# Patient Record
Sex: Male | Born: 1955 | Race: White | Hispanic: No | Marital: Married | Smoking: Former smoker
Health system: Southern US, Community
[De-identification: ages and names within clinical notes are randomized; demographics above are authoritative.]

## PROBLEM LIST (undated history)

## (undated) DIAGNOSIS — J302 Other seasonal allergic rhinitis: Secondary | ICD-10-CM

## (undated) DIAGNOSIS — E079 Disorder of thyroid, unspecified: Secondary | ICD-10-CM

## (undated) DIAGNOSIS — E7800 Pure hypercholesterolemia, unspecified: Secondary | ICD-10-CM

## (undated) DIAGNOSIS — C9000 Multiple myeloma not having achieved remission: Secondary | ICD-10-CM

## (undated) DIAGNOSIS — K219 Gastro-esophageal reflux disease without esophagitis: Secondary | ICD-10-CM

## (undated) DIAGNOSIS — M5431 Sciatica, right side: Secondary | ICD-10-CM

## (undated) DIAGNOSIS — E039 Hypothyroidism, unspecified: Secondary | ICD-10-CM

## (undated) DIAGNOSIS — Z9289 Personal history of other medical treatment: Secondary | ICD-10-CM

## (undated) DIAGNOSIS — J3089 Other allergic rhinitis: Secondary | ICD-10-CM

## (undated) DIAGNOSIS — J189 Pneumonia, unspecified organism: Secondary | ICD-10-CM

## (undated) DIAGNOSIS — J849 Interstitial pulmonary disease, unspecified: Secondary | ICD-10-CM

## (undated) HISTORY — DX: Other seasonal allergic rhinitis: J30.2

## (undated) HISTORY — PX: NASAL SINUS SURGERY: SHX719

## (undated) HISTORY — DX: Other allergic rhinitis: J30.89

## (undated) HISTORY — PX: NECK SURGERY: SHX720

## (undated) HISTORY — DX: Sciatica, right side: M54.31

---

## 1994-10-21 HISTORY — PX: NECK SURGERY: SHX720

## 2006-11-09 ENCOUNTER — Inpatient Hospital Stay (HOSPITAL_COMMUNITY): Admission: EM | Admit: 2006-11-09 | Discharge: 2006-11-11 | Payer: Self-pay | Admitting: Emergency Medicine

## 2011-07-16 ENCOUNTER — Other Ambulatory Visit: Payer: Self-pay | Admitting: Allergy

## 2011-07-16 ENCOUNTER — Ambulatory Visit
Admission: RE | Admit: 2011-07-16 | Discharge: 2011-07-16 | Disposition: A | Discharge status: Home / Self Care | Payer: Managed Care, Other (non HMO) | Source: Ambulatory Visit | Attending: Allergy | Admitting: Allergy

## 2011-07-16 DIAGNOSIS — J45901 Unspecified asthma with (acute) exacerbation: Secondary | ICD-10-CM

## 2011-10-01 ENCOUNTER — Institutional Professional Consult (permissible substitution): Payer: Managed Care, Other (non HMO) | Admitting: Internal Medicine

## 2011-10-03 ENCOUNTER — Encounter: Payer: Self-pay | Admitting: *Deleted

## 2011-10-03 ENCOUNTER — Encounter: Payer: Self-pay | Admitting: Internal Medicine

## 2011-10-04 ENCOUNTER — Ambulatory Visit (INDEPENDENT_AMBULATORY_CARE_PROVIDER_SITE_OTHER): Payer: Managed Care, Other (non HMO) | Admitting: Internal Medicine

## 2011-10-04 ENCOUNTER — Encounter: Payer: Self-pay | Admitting: Internal Medicine

## 2011-10-04 VITALS — BP 110/84 | HR 57 | Temp 98.0°F | Ht 71.0 in | Wt 222.4 lb

## 2011-10-04 DIAGNOSIS — R0602 Shortness of breath: Secondary | ICD-10-CM

## 2011-10-04 NOTE — Assessment & Plan Note (Signed)
Unclear etiology for dyspnea. Differential diagnosis includes asthma, lack of fitness, anxiety state , or even rarely neuromuscular weakness especially given history of hypothyroidism. Angina equivalent is a remote possibility. I will first get pulmonary function test. When I mention this he said that he would be claustrophobic for the body box method but our method is different and he has agreed to proceed. His pulmonary function test is normal we'll proceed with methacholine challenge test soon as he comes off Singulair for that. If that too is negative, we'll get cardiopulmonary bike stress test. Meanwhile we'll await for results of cardiology workup from Dr. Jacinto Halim. Update Dr. Newt Lukes

## 2011-10-04 NOTE — Patient Instructions (Signed)
Nurse will show you our PFT room so you feel comfortable with that room for testing Please have full PFT breathing test - I will look at result and call you with next step which could be methacholine challenge test or bike pulmonary stress test I will be in touch with Dr Jacinto Halim as well

## 2011-10-04 NOTE — Progress Notes (Signed)
Subjective:    Patient ID: Samuel [name], male    DOB: 31-Dec-1955, 55 y.o.   MRN: [id number]  HPI  IOV 10/04/2011  55 year old male. Allergies and possible asthma. Hypothyroidism. Anxiety state but no formal diagnosis. 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 Reports dyspnea. INsidious onset "all my life". Increased after starting allergy shots in June 2012. Says during hikes after he gets going it gets better. Same in gym. Notices more when he is starting out or stationary at rest. HE is not sure what it is.  Feels he needs to take a deep breath on occasion. So, now referred to cardiology and pulmonary. Says ICS Qvar in august/sept 2012 made it worse. Outside chart mentions asthma but patient says not sure if he has asthma. But reports childhood hx of asthma diagnosed by a pediatrician at age 56. Says he had similar symptoms.  Up until 1990 used to 10K but even back then had similar symptoms and would need to warm up before feeling better. Then stopped running due to neck problems. Similar during swimming exercise in 1996-1997. Quit swimming 1999. Episodes associated with wheezing but no cough. Unclear if albuterol helps but on xopenex prn esp before walking. This whole thing feels like he is not getting "enough juice". Never had PFT but recollects normal spirometry at Dr Lyla Son office.  He has seen Dr Shon Baton cardiology for above - treadmill and echo and carotid doppler all pending. Current pulmonary modulating drug is Singulair only  Chest x-ray 07/16/2011 is clear per my personal review.   Review of Systems  Constitutional: Negative for fever and unexpected weight change.  HENT: Negative for ear pain, nosebleeds, congestion, sore throat, rhinorrhea, sneezing, trouble swallowing, dental problem, postnasal drip and sinus pressure.   Eyes: Negative for redness and itching.  Respiratory: Positive for  shortness of breath. Negative for cough, chest tightness and wheezing.   Cardiovascular: Negative for palpitations and leg swelling.  Gastrointestinal: Negative for nausea and vomiting.  Genitourinary: Negative for dysuria.  Musculoskeletal: Negative for joint swelling.  Skin: Negative for rash.  Neurological: Negative for headaches.  Hematological: Does not bruise/bleed easily.  Psychiatric/Behavioral: Negative for dysphoric mood. The patient is nervous/anxious.        Objective:   Physical Exam  Nursing note and vitals reviewed. Constitutional: He is oriented to person, place, and time. He appears well-developed and well-nourished. No distress.  HENT:  Head: Normocephalic and atraumatic.  Right Ear: External ear normal.  Left Ear: External ear normal.  Mouth/Throat: Oropharynx is clear and moist. No oropharyngeal exudate.  Eyes: Conjunctivae and EOM are normal. Pupils are equal, round, and reactive to light. Right eye exhibits no discharge. Left eye exhibits no discharge. No scleral icterus.  Neck: Normal range of motion. Neck supple. No JVD present. No tracheal deviation present. No thyromegaly present.  Cardiovascular: Normal rate, regular rhythm and intact distal pulses.  Exam reveals no gallop and no friction rub.   No murmur heard. Pulmonary/Chest: Effort normal and breath sounds normal. No respiratory distress. He has no wheezes. He has no rales. He exhibits no tenderness.  Abdominal: Soft. Bowel sounds are normal. He exhibits no distension and no mass. There is no tenderness. There is no rebound and no guarding.  Musculoskeletal: Normal range of motion. He exhibits no edema and no tenderness.  Lymphadenopathy:    He has no cervical adenopathy.  Neurological: He is alert and oriented to person, place, and time. He has normal reflexes. No cranial nerve deficit. Coordination normal.  Skin: Skin is warm and dry. No rash noted. He is not diaphoretic. No erythema. No pallor.    Psychiatric: He has a normal mood and affect. His behavior is normal. Judgment and thought content normal.          Assessment & Plan:

## 2011-10-25 ENCOUNTER — Ambulatory Visit (INDEPENDENT_AMBULATORY_CARE_PROVIDER_SITE_OTHER): Payer: Managed Care, Other (non HMO) | Admitting: Internal Medicine

## 2011-10-25 DIAGNOSIS — R0602 Shortness of breath: Secondary | ICD-10-CM

## 2011-10-25 LAB — PULMONARY FUNCTION TEST

## 2011-10-25 NOTE — Progress Notes (Signed)
PFT done today. 

## 2011-10-29 ENCOUNTER — Telehealth: Payer: Self-pay | Admitting: Internal Medicine

## 2011-10-29 DIAGNOSIS — R05 Cough: Secondary | ICD-10-CM

## 2011-10-29 DIAGNOSIS — R059 Cough, unspecified: Secondary | ICD-10-CM

## 2011-10-29 NOTE — Telephone Encounter (Signed)
Please let him know pft 10/25/11 is normal. And as disussed in office next step is to have methacholine challenge test;  1 week off singulair and 2-3 weeks off ihaled steroids. AFter that, I will reivew that and if normal order CPST bike test again as adiscussed in office. Please set up above

## 2011-10-31 NOTE — Telephone Encounter (Signed)
Pt has called in to get the results from his PFT.  Pt asked to be reached at [phone].  Antionette Fairy

## 2011-10-31 NOTE — Telephone Encounter (Signed)
I reviewed PFT results with the patient. Order placed for MCT. Carron Curie, CMA

## 2011-11-04 ENCOUNTER — Encounter: Payer: Self-pay | Admitting: Internal Medicine

## 2011-11-07 ENCOUNTER — Ambulatory Visit (HOSPITAL_COMMUNITY)
Admission: RE | Admit: 2011-11-07 | Discharge: 2011-11-07 | Disposition: A | Discharge status: Home / Self Care | Payer: Managed Care, Other (non HMO) | Source: Ambulatory Visit | Attending: Internal Medicine | Admitting: Internal Medicine

## 2011-11-07 DIAGNOSIS — R05 Cough: Secondary | ICD-10-CM

## 2011-11-07 DIAGNOSIS — R0602 Shortness of breath: Secondary | ICD-10-CM | POA: Insufficient documentation

## 2011-11-07 DIAGNOSIS — R059 Cough, unspecified: Secondary | ICD-10-CM

## 2011-11-07 LAB — PULMONARY FUNCTION TEST

## 2011-11-07 MED ORDER — METHACHOLINE 16 MG/ML NEB SOLN
2.0000 mL | Freq: Once | RESPIRATORY_TRACT | Status: AC
  Administered 2011-11-07: 32 mg via RESPIRATORY_TRACT
  Filled 2011-11-07: qty 2

## 2011-11-07 MED ORDER — ALBUTEROL SULFATE (5 MG/ML) 0.5% IN NEBU
2.5000 mg | INHALATION_SOLUTION | Freq: Once | RESPIRATORY_TRACT | Status: AC
  Administered 2011-11-07: 2.5 mg via RESPIRATORY_TRACT

## 2011-11-07 MED ORDER — METHACHOLINE 4 MG/ML NEB SOLN
2.0000 mL | Freq: Once | RESPIRATORY_TRACT | Status: AC
  Administered 2011-11-07: 8 mg via RESPIRATORY_TRACT
  Filled 2011-11-07: qty 2

## 2011-11-07 MED ORDER — METHACHOLINE 1 MG/ML NEB SOLN
2.0000 mL | Freq: Once | RESPIRATORY_TRACT | Status: AC
  Administered 2011-11-07: 2 mg via RESPIRATORY_TRACT
  Filled 2011-11-07: qty 2

## 2011-11-07 MED ORDER — METHACHOLINE 0.0625 MG/ML NEB SOLN
2.0000 mL | Freq: Once | RESPIRATORY_TRACT | Status: AC
  Administered 2011-11-07: 0.125 mg via RESPIRATORY_TRACT
  Filled 2011-11-07: qty 2

## 2011-11-07 MED ORDER — METHACHOLINE 0.25 MG/ML NEB SOLN
2.0000 mL | Freq: Once | RESPIRATORY_TRACT | Status: AC
  Administered 2011-11-07: 0.5 mg via RESPIRATORY_TRACT
  Filled 2011-11-07: qty 2

## 2011-11-07 MED ORDER — SODIUM CHLORIDE 0.9 % IN NEBU
3.0000 mL | INHALATION_SOLUTION | Freq: Once | RESPIRATORY_TRACT | Status: AC
  Administered 2011-11-07: 3 mL via RESPIRATORY_TRACT
  Filled 2011-11-07: qty 3

## 2011-11-14 ENCOUNTER — Telehealth: Payer: Self-pay | Admitting: Internal Medicine

## 2011-11-14 DIAGNOSIS — R0600 Dyspnea, unspecified: Secondary | ICD-10-CM

## 2011-11-14 NOTE — Telephone Encounter (Signed)
Methacholin ehcallenge test 11/07/11 is borderline for asthma or not.   He needs to proceed to CPST test with EIB challenge for dyspnea, done by Paul Chase. And then come and see me. This was discussed with him at prior OV. IF he is confused or does not understand or has more questions please let me know and I will call him 

## 2011-11-14 NOTE — Telephone Encounter (Signed)
Methacholin ehcallenge test 11/07/11 is borderline for asthma or not.   He needs to proceed to CPST test with EIB challenge for dyspnea, done by Laymond Purser. And then come and see me. This was discussed with him at prior OV. IF he is confused or does not understand or has more questions please let me know and I will call him

## 2011-11-14 NOTE — Telephone Encounter (Signed)
Pt aware of results and order placed for CPST. Carron Curie, CMA

## 2016-10-31 ENCOUNTER — Other Ambulatory Visit: Payer: Self-pay | Admitting: Family Medicine

## 2016-10-31 ENCOUNTER — Ambulatory Visit
Admission: RE | Admit: 2016-10-31 | Discharge: 2016-10-31 | Disposition: A | Discharge status: Home / Self Care | Payer: Managed Care, Other (non HMO) | Source: Ambulatory Visit | Attending: Family Medicine | Admitting: Family Medicine

## 2016-10-31 DIAGNOSIS — J069 Acute upper respiratory infection, unspecified: Secondary | ICD-10-CM

## 2017-05-16 ENCOUNTER — Ambulatory Visit
Admission: RE | Admit: 2017-05-16 | Discharge: 2017-05-16 | Disposition: A | Discharge status: Home / Self Care | Payer: 59 | Source: Ambulatory Visit | Attending: Allergy | Admitting: Allergy

## 2017-05-16 ENCOUNTER — Other Ambulatory Visit: Payer: Self-pay | Admitting: Allergy

## 2017-05-16 DIAGNOSIS — J4520 Mild intermittent asthma, uncomplicated: Secondary | ICD-10-CM

## 2017-11-09 ENCOUNTER — Other Ambulatory Visit: Payer: Self-pay | Admitting: Family Medicine

## 2017-11-09 DIAGNOSIS — M25561 Pain in right knee: Secondary | ICD-10-CM

## 2017-11-13 ENCOUNTER — Ambulatory Visit
Admission: RE | Admit: 2017-11-13 | Discharge: 2017-11-13 | Disposition: A | Discharge status: Home / Self Care | Payer: 59 | Source: Ambulatory Visit | Attending: Family Medicine | Admitting: Family Medicine

## 2017-11-13 DIAGNOSIS — M25561 Pain in right knee: Secondary | ICD-10-CM

## 2020-04-18 ENCOUNTER — Ambulatory Visit (INDEPENDENT_AMBULATORY_CARE_PROVIDER_SITE_OTHER): Payer: Managed Care, Other (non HMO) | Admitting: Family Medicine

## 2020-04-18 ENCOUNTER — Other Ambulatory Visit: Payer: Self-pay

## 2020-04-18 ENCOUNTER — Encounter: Payer: Self-pay | Admitting: Family Medicine

## 2020-04-18 ENCOUNTER — Ambulatory Visit: Payer: Self-pay

## 2020-04-18 VITALS — BP 130/78 | HR 63 | Ht 71.0 in | Wt 228.0 lb

## 2020-04-18 DIAGNOSIS — M7541 Impingement syndrome of right shoulder: Secondary | ICD-10-CM

## 2020-04-18 DIAGNOSIS — M79601 Pain in right arm: Secondary | ICD-10-CM

## 2020-04-18 NOTE — Patient Instructions (Addendum)
Thank you for coming in today. Attend PT.  Use voltaren gel over the counter up to 4x daily.  Let me know if not improving in 4-6 weeks.  Next step is probably injection.    Shoulder Impingement Syndrome Rehab Ask your health care provider which exercises are safe for you. Do exercises exactly as told by your health care provider and adjust them as directed. It is normal to feel mild stretching, pulling, tightness, or discomfort as you do these exercises. Stop right away if you feel sudden pain or your pain gets worse. Do not begin these exercises until told by your health care provider. Stretching and range-of-motion exercise This exercise warms up your muscles and joints and improves the movement and flexibility of your shoulder. This exercise also helps to relieve pain and stiffness. Passive horizontal adduction In passive adduction, you use your other hand to move the injured arm toward your body. The injured arm does not move on its own. In this movement, your arm is moved across your body in the horizontal plane (horizontal adduction). 1. Sit or stand and pull your left / right elbow across your chest, toward your other shoulder. Stop when you feel a gentle stretch in the back of your shoulder and upper arm. ? Keep your arm at shoulder height. ? Keep your arm as close to your body as you comfortably can. 2. Hold for __________ seconds. 3. Slowly return to the starting position. Repeat __________ times. Complete this exercise __________ times a day. Strengthening exercises These exercises build strength and endurance in your shoulder. Endurance is the ability to use your muscles for a long time, even after they get tired. External rotation, isometric This is an exercise in which you press the back of your wrist against a door frame without moving your shoulder joint (isometric). 1. Stand or sit in a doorway, facing the door frame. 2. Bend your left / right elbow and place the back of  your wrist against the door frame. Only the back of your wrist should be touching the frame. Keep your upper arm at your side. 3. Gently press your wrist against the door frame, as if you are trying to push your arm away from your abdomen (external rotation). Press as hard as you are able without pain. ? Avoid shrugging your shoulder while you press your wrist against the door frame. Keep your shoulder blade tucked down toward the middle of your back. 4. Hold for __________ seconds. 5. Slowly release the tension, and relax your muscles completely before you repeat the exercise. Repeat __________ times. Complete this exercise __________ times a day. Internal rotation, isometric This is an exercise in which you press your palm against a door frame without moving your shoulder joint (isometric). 1. Stand or sit in a doorway, facing the door frame. 2. Bend your left / right elbow and place the palm of your hand against the door frame. Only your palm should be touching the frame. Keep your upper arm at your side. 3. Gently press your hand against the door frame, as if you are trying to push your arm toward your abdomen (internal rotation). Press as hard as you are able without pain. ? Avoid shrugging your shoulder while you press your hand against the door frame. Keep your shoulder blade tucked down toward the middle of your back. 4. Hold for __________ seconds. 5. Slowly release the tension, and relax your muscles completely before you repeat the exercise. Repeat __________ times. Complete this  exercise __________ times a day. Scapular protraction, supine  1. Lie on your back on a firm surface (supine position). Hold a __________ weight in your left / right hand. 2. Raise your left / right arm straight into the air so your hand is directly above your shoulder joint. 3. Push the weight into the air so your shoulder (scapula) lifts off the surface that you are lying on. The scapula will push up or  forward (protraction). Do not move your head, neck, or back. 4. Hold for __________ seconds. 5. Slowly return to the starting position. Let your muscles relax completely before you repeat this exercise. Repeat __________ times. Complete this exercise __________ times a day. Scapular retraction  1. Sit in a stable chair without armrests, or stand up. 2. Secure an exercise band to a stable object in front of you so the band is at shoulder height. 3. Hold one end of the exercise band in each hand. Your palms should face down. 4. Squeeze your shoulder blades together (retraction) and move your elbows slightly behind you. Do not shrug your shoulders upward while you do this. 5. Hold for __________ seconds. 6. Slowly return to the starting position. Repeat __________ times. Complete this exercise __________ times a day. Shoulder extension  1. Sit in a stable chair without armrests, or stand up. 2. Secure an exercise band to a stable object in front of you so the band is above shoulder height. 3. Hold one end of the exercise band in each hand. 4. Straighten your elbows and lift your hands up to shoulder height. 5. Squeeze your shoulder blades together and pull your hands down to the sides of your thighs (extension). Stop when your hands are straight down by your sides. Do not let your hands go behind your body. 6. Hold for __________ seconds. 7. Slowly return to the starting position. Repeat __________ times. Complete this exercise __________ times a day. This information is not intended to replace advice given to you by your health care provider. Make sure you discuss any questions you have with your health care provider. Document Revised: 01/29/2019 Document Reviewed: 11/02/2018 Elsevier Patient Education  [place].

## 2020-04-18 NOTE — Progress Notes (Signed)
    Subjective:    CC: R shoulder pain  I, Molly Weber, LAT, ATC, am serving as scribe for Dr. Lynne Leader.  HPI: Pt is a 64 y/o male presenting w/ c/o shoulder pain x 10 days w/ no known MOI.  He does recall playing golf around that time and also having a deep tissue massage.  He locates his pain to his R lateral arm  Radiating pain: yes into R upper arm Shoulder mechanical symptoms: yes Aggravating factors: bent elbow golf swing; reaching; lowering from an aBducted position; R sidelying Treatments tried: Advil;   Pertinent review of Systems: No fevers or chills  Relevant historical information: Hypothyroidism   Objective:    Vitals:   04/18/20 1450  BP: 130/78  Pulse: 63  SpO2: 98%   General: Well Developed, well nourished, and in no acute distress.   MSK: Right shoulder normal-appearing Nontender. Normal motion pain with abduction. Intact strength abduction external/internal rotation pain with abduction and internal rotation. Positive empty can test. Negative Hawkins and Neer's test. Negative Yergason's and speeds test.   Lab and Radiology Results  Diagnostic Limited MSK Ultrasound of: Right shoulder Biceps tendon intact normal-appearing in bicipital groove. Subscapularis tendon is intact. Supraspinatus tendon is intact with increased subacromial bursa thickness present. Infraspinatus tendon is intact. AC joint degenerative with effusion. Impression: Subacromial bursitis and AC DJD.    Impression and Recommendations:    Assessment and Plan: 64 y.o. male with right shoulder pain ongoing for about 10 days.  Pain consistent with subacromial bursitis or impingement.  Plan for physical therapy.  Patient has seen [facility] previously and done well any time to go back there.  Refer to that location.  Recommend also Voltaren gel.  Discussed possibility of injection.  He would like to try physical therapy first which is reasonable.  Recheck 4 to 6 weeks sooner if  needed.Marland Kitchen  PDMP not reviewed this encounter. Orders Placed This Encounter  Procedures  . [place] LIMITED JOINT SPACE STRUCTURES UP RIGHT(NO LINKED CHARGES)    Order Specific Question:   Reason for Exam (SYMPTOM  OR DIAGNOSIS REQUIRED)    Answer:   R arm pain    Order Specific Question:   Preferred imaging location?    Answer:   [place]  . Ambulatory referral to Physical Therapy    Referral Priority:   Routine    Referral Type:   Physical Medicine    Referral Reason:   Specialty Services Required    Requested Specialty:   Physical Therapy    Number of Visits Requested:   1   No orders of the defined types were placed in this encounter.   Discussed warning signs or symptoms. Please see discharge instructions. Patient expresses understanding.   The above documentation has been reviewed and is accurate and complete Lynne Leader, M.D.

## 2020-11-22 NOTE — Progress Notes (Signed)
I, Peterson Lombard, LAT, ATC acting as a scribe for Lynne Leader, MD.  Samuel [name] is a 65 y.o. male who presents to [place] [facility] today for R calf pain ongoing for 3-4 weeks. Pt was last seen by Dr. Georgina Snell on 04/18/20 for R shoulder pain secondary to subacromial bursitis/impingement. Today, pt note R calf pain w/ no known MOI. He may have injured it doing a calf exercise at the gym. He locates his pain to lateral aspect of gastroc. Pt c/o of sometimes he experiences severe pain, burning, and wakes him up at night.  He notes some pain in his low back that radiates down his right leg to the lateral calf as well.  This also is worse with prolonged standing and at bedtime.  On his own he has been seeing physical therapy at [facility] which has been helping his back pain by improving his core strength.  Radiating pain: no R calf swelling: no Aggravating factors: inactivity Treatments tried: none  Dx imaging: 11/13/17 R knee MRI  Pertinent review of systems: No fevers or chills  Relevant historical information: Asthma hypercholesterolemia   Exam:  BP 128/84   Pulse 67   Ht 5\' 11"  (1.803 m)   Wt 216 lb 3.2 oz (98.1 kg)   SpO2 97%   BMI 30.15 kg/m  General: Well Developed, well nourished, and in no acute distress.   MSK: L-spine normal-appearing Nontender to midline.  Nontender paraspinal musculature. Range of motion limited extension normal flexion rotation and lateral flexion. Lower extremity strength reflexes and sensation are equal normal throughout bilateral lower extremities. Negative slump test bilaterally.   Right calf normal-appearing minimally tender palpation right lateral calf.  Excellent Strength without reproduction of pain.     Lab and Radiology Results  X-ray images L-spine and AP pelvis obtained today personally and independently interpreted   Spondylolisthesis L5-S1 grade 1.  Mild DDD at L1-L2 and  L5-S1  Pelvis: No fractures.  Minimal degenerative changes superior acetabulum bilaterally.  Await formal radiology review  Diagnostic Limited MSK Ultrasound of: Right lateral calf Normal-appearing musculature and right lateral calf area of tenderness.  Common fibular nerve in this area is also normal.  Nontender to compression with ultrasound probe. Impression: No evidence of calf muscle tear   Assessment and Plan: 65 y.o. male with right lateral calf pain in the setting of back pain and some radiating pain down the leg.  Patient certainly could have suffered a mild calf strain.  We will go ahead and treat for calf strain with compression sleeve and home exercise program taught in clinic today by ATC.  However patient also has evidence of lumbar radiculopathy thought to be due to spondylolisthesis at L5-S1.  He already has existing physical therapy working on core strengthening.  That should be emphasized as well.  If not improving would consider MRI for possible epidural steroid injection planning.   PDMP not reviewed this encounter. Orders Placed This Encounter  Procedures  . [place] LIMITED JOINT SPACE STRUCTURES LOW RIGHT(NO LINKED CHARGES)    Standing Status:   Future    Number of Occurrences:   1    Standing Expiration Date:   05/23/2021    Order Specific Question:   Reason for Exam (SYMPTOM  OR DIAGNOSIS REQUIRED)    Answer:   rigtht calf pain    Order Specific Question:   Preferred imaging location?    Answer:   [place]  . DG  Lumbar Spine Complete    Standing Status:   Future    Number of Occurrences:   1    Standing Expiration Date:   11/23/2021    Order Specific Question:   Reason for Exam (SYMPTOM  OR DIAGNOSIS REQUIRED)    Answer:   lumbar rad    Order Specific Question:   Preferred imaging location?    Answer:   [facility]   No orders of the defined types were placed in this encounter.    Discussed warning signs or symptoms. Please  see discharge instructions. Patient expresses understanding.   The above documentation has been reviewed and is accurate and complete Lynne Leader, M.D.

## 2020-11-23 ENCOUNTER — Other Ambulatory Visit: Payer: Self-pay

## 2020-11-23 ENCOUNTER — Ambulatory Visit: Payer: Self-pay

## 2020-11-23 ENCOUNTER — Ambulatory Visit (INDEPENDENT_AMBULATORY_CARE_PROVIDER_SITE_OTHER): Payer: Managed Care, Other (non HMO)

## 2020-11-23 ENCOUNTER — Ambulatory Visit (INDEPENDENT_AMBULATORY_CARE_PROVIDER_SITE_OTHER): Payer: Managed Care, Other (non HMO) | Admitting: Family Medicine

## 2020-11-23 VITALS — BP 128/84 | HR 67 | Ht 71.0 in | Wt 216.2 lb

## 2020-11-23 DIAGNOSIS — M79661 Pain in right lower leg: Secondary | ICD-10-CM

## 2020-11-23 DIAGNOSIS — J4530 Mild persistent asthma, uncomplicated: Secondary | ICD-10-CM | POA: Insufficient documentation

## 2020-11-23 DIAGNOSIS — J4520 Mild intermittent asthma, uncomplicated: Secondary | ICD-10-CM | POA: Insufficient documentation

## 2020-11-23 DIAGNOSIS — J45909 Unspecified asthma, uncomplicated: Secondary | ICD-10-CM | POA: Insufficient documentation

## 2020-11-23 DIAGNOSIS — M5416 Radiculopathy, lumbar region: Secondary | ICD-10-CM

## 2020-11-23 DIAGNOSIS — J45901 Unspecified asthma with (acute) exacerbation: Secondary | ICD-10-CM | POA: Insufficient documentation

## 2020-11-23 DIAGNOSIS — J309 Allergic rhinitis, unspecified: Secondary | ICD-10-CM | POA: Insufficient documentation

## 2020-11-23 NOTE — Patient Instructions (Addendum)
Thank you for coming in today.  Please complete the exercises that the athletic trainer went over with you: View at my-exercise-code.com using code: BOF7P1W  I think this may be coming from your back.  It could also be a calf strain or even both.   [employer] does Careers information officer. I think they can help set up your [facility] [phone]  I recommend you obtained a compression sleeve to help with your joint problems. There are many options on the market however I recommend obtaining a full calf Body Helix compression sleeve.  You can find information (including how to appropriate measure yourself for sizing) can be found at www.Body http://www.lambert.com/.  Many of these products are health savings account (HSA) eligible.   You can use the compression sleeve at any time throughout the day but is most important to use while being active as well as for 2 hours post-activity.   It is appropriate to ice following activity with the compression sleeve in place.

## 2020-11-24 NOTE — Progress Notes (Signed)
Pelvis x-ray looks normal to radiology

## 2020-11-24 NOTE — Progress Notes (Signed)
X-ray lumbar spine shows anterior listhesis at L4-5.  This is grade 1.  This is what I was suspicious about what were talking about in clinic.  Physical therapy is the appropriate treatment for this.  If not improving next step would be MRI for injection planning.  I think the calf pain is probably coming from pinched nerve in your back

## 2021-02-02 ENCOUNTER — Emergency Department (HOSPITAL_BASED_OUTPATIENT_CLINIC_OR_DEPARTMENT_OTHER)
Admission: EM | Admit: 2021-02-02 | Discharge: 2021-02-02 | Disposition: A | Discharge status: Home / Self Care | Payer: Managed Care, Other (non HMO) | Attending: Emergency Medicine | Admitting: Emergency Medicine

## 2021-02-02 ENCOUNTER — Other Ambulatory Visit: Payer: Self-pay

## 2021-02-02 ENCOUNTER — Emergency Department (HOSPITAL_BASED_OUTPATIENT_CLINIC_OR_DEPARTMENT_OTHER): Payer: Managed Care, Other (non HMO)

## 2021-02-02 ENCOUNTER — Emergency Department (HOSPITAL_BASED_OUTPATIENT_CLINIC_OR_DEPARTMENT_OTHER): Payer: Managed Care, Other (non HMO) | Admitting: Radiology

## 2021-02-02 ENCOUNTER — Encounter (HOSPITAL_BASED_OUTPATIENT_CLINIC_OR_DEPARTMENT_OTHER): Payer: Self-pay | Admitting: *Deleted

## 2021-02-02 DIAGNOSIS — M898X9 Other specified disorders of bone, unspecified site: Secondary | ICD-10-CM

## 2021-02-02 DIAGNOSIS — Z87891 Personal history of nicotine dependence: Secondary | ICD-10-CM | POA: Insufficient documentation

## 2021-02-02 DIAGNOSIS — X58XXXA Exposure to other specified factors, initial encounter: Secondary | ICD-10-CM | POA: Insufficient documentation

## 2021-02-02 DIAGNOSIS — S3991XA Unspecified injury of abdomen, initial encounter: Secondary | ICD-10-CM | POA: Diagnosis present

## 2021-02-02 DIAGNOSIS — M899 Disorder of bone, unspecified: Secondary | ICD-10-CM | POA: Diagnosis not present

## 2021-02-02 DIAGNOSIS — T148XXA Other injury of unspecified body region, initial encounter: Secondary | ICD-10-CM

## 2021-02-02 DIAGNOSIS — J45909 Unspecified asthma, uncomplicated: Secondary | ICD-10-CM | POA: Insufficient documentation

## 2021-02-02 DIAGNOSIS — S301XXA Contusion of abdominal wall, initial encounter: Secondary | ICD-10-CM | POA: Diagnosis not present

## 2021-02-02 DIAGNOSIS — R109 Unspecified abdominal pain: Secondary | ICD-10-CM

## 2021-02-02 DIAGNOSIS — R10A0 Flank pain, unspecified side: Secondary | ICD-10-CM

## 2021-02-02 HISTORY — DX: Disorder of thyroid, unspecified: E07.9

## 2021-02-02 HISTORY — DX: Pure hypercholesterolemia, unspecified: E78.00

## 2021-02-02 LAB — CBC WITH DIFFERENTIAL/PLATELET
Abs Immature Granulocytes: 0.01 10*3/uL (ref 0.00–0.07)
Basophils Absolute: 0 10*3/uL (ref 0.0–0.1)
Basophils Relative: 1 %
Eosinophils Absolute: 0.1 10*3/uL (ref 0.0–0.5)
Eosinophils Relative: 2 %
HCT: 41.2 % (ref 39.0–52.0)
Hemoglobin: 14 g/dL (ref 13.0–17.0)
Immature Granulocytes: 0 %
Lymphocytes Relative: 27 %
Lymphs Abs: 1.4 10*3/uL (ref 0.7–4.0)
MCH: 31.5 pg (ref 26.0–34.0)
MCHC: 34 g/dL (ref 30.0–36.0)
MCV: 92.8 fL (ref 80.0–100.0)
Monocytes Absolute: 0.5 10*3/uL (ref 0.1–1.0)
Monocytes Relative: 9 %
Neutro Abs: 3.3 10*3/uL (ref 1.7–7.7)
Neutrophils Relative %: 61 %
Platelets: 215 10*3/uL (ref 150–400)
RBC: 4.44 MIL/uL (ref 4.22–5.81)
RDW: 12.9 % (ref 11.5–15.5)
WBC: 5.3 10*3/uL (ref 4.0–10.5)
nRBC: 0 % (ref 0.0–0.2)

## 2021-02-02 LAB — COMPREHENSIVE METABOLIC PANEL
ALT: 21 U/L (ref 0–44)
AST: 25 U/L (ref 15–41)
Albumin: 4.2 g/dL (ref 3.5–5.0)
Alkaline Phosphatase: 67 U/L (ref 38–126)
Anion gap: 6 (ref 5–15)
BUN: 20 mg/dL (ref 8–23)
CO2: 29 mmol/L (ref 22–32)
Calcium: 9.4 mg/dL (ref 8.9–10.3)
Chloride: 96 mmol/L — ABNORMAL LOW (ref 98–111)
Creatinine, Ser: 1.07 mg/dL (ref 0.61–1.24)
GFR, Estimated: >60 mL/min (ref >60)
Glucose, Bld: 86 mg/dL (ref 70–99)
Potassium: 4.4 mmol/L (ref 3.5–5.1)
Sodium: 131 mmol/L — ABNORMAL LOW (ref 135–145)
Total Bilirubin: 0.4 mg/dL (ref 0.3–1.2)
Total Protein: 7.8 g/dL (ref 6.5–8.1)

## 2021-02-02 LAB — TROPONIN I (HIGH SENSITIVITY)
Troponin I (High Sensitivity): 6 ng/L (ref <18)
Troponin I (High Sensitivity): 7 ng/L (ref <18)

## 2021-02-02 LAB — PROTIME-INR
INR: 1 (ref 0.8–1.2)
Prothrombin Time: 13.6 seconds (ref 11.4–15.2)

## 2021-02-02 LAB — LIPASE, BLOOD: Lipase: 14 U/L (ref 11–51)

## 2021-02-02 LAB — LACTATE DEHYDROGENASE: LDH: 169 U/L (ref 98–192)

## 2021-02-02 LAB — PSA: Prostatic Specific Antigen: 0.59 ng/mL (ref 0.00–4.00)

## 2021-02-02 MED ORDER — IOHEXOL 300 MG/ML  SOLN
100.0000 mL | Freq: Once | INTRAMUSCULAR | Status: AC | PRN
  Administered 2021-02-02: 100 mL via INTRAVENOUS

## 2021-02-02 NOTE — ED Triage Notes (Signed)
Bruise to right side of of his abdomen.  Pt stated that he noticed on Monday.  Denies any injury. Pt is not on blood thinner.

## 2021-02-02 NOTE — ED Provider Notes (Signed)
[facility] EMERGENCY DEPT Provider Note   CSN: [id number] Arrival date & time: 02/02/21  1108     History Chief Complaint  Patient presents with  . Bruise    Samuel [name] is a 65 y.o. male.  The history is provided by the patient and medical records. No language interpreter was used.  Flank Pain This is a new problem. The current episode started more than 2 days ago. The problem occurs rarely. The problem has not changed since onset.Pertinent negatives include no chest pain, no abdominal pain (r flank tenderness with bruising), no headaches and no shortness of breath. Nothing aggravates the symptoms. Nothing relieves the symptoms. He has tried nothing for the symptoms. The treatment provided no relief.       Past Medical History:  Diagnosis Date  . Asthma   . High cholesterol   . Perennial allergic rhinitis   . Seasonal allergic rhinitis   . Thyroid disease     Patient Active Problem List   Diagnosis Date Noted  . Allergic rhinitis 11/23/2020  . Mild intermittent asthma 11/23/2020  . SOB (shortness of breath) 10/04/2011    Past Surgical History:  Procedure Laterality Date  . NASAL SINUS SURGERY    . NECK SURGERY         Family History  Problem Relation Age of Onset  . Allergies Father   . Allergies Mother   . Hypertension Other   . Heart disease Other     Social History   Tobacco Use  . Smoking status: Former Smoker    Packs/day: 0.10    Years: 5.00    Pack years: 0.50    Quit date: 10/21/1982    Years since quitting: 38.3  . Smokeless tobacco: Never Used  Substance Use Topics  . Alcohol use: Yes    Comment: 1 drink daily  . Drug use: No    Home Medications Prior to Admission medications   Medication Sig Start Date End Date Taking? Authorizing Provider  allopurinol (ZYLOPRIM) 100 MG tablet Take 100 mg by mouth daily.   Yes [provider]  cholecalciferol (VITAMIN D) 1000 UNITS tablet Take 3,000 Units by mouth daily.    Yes [provider]  ezetimibe-simvastatin (VYTORIN) 10-40 MG per tablet Take 1 tablet by mouth at bedtime.   Yes [provider]  levothyroxine (SYNTHROID, LEVOTHROID) 50 MCG tablet Take 50 mcg by mouth daily.   Yes [provider]  Multiple Vitamin (MULTIVITAMIN) tablet Take 1 tablet by mouth daily.   Yes [provider]  omeprazole (PRILOSEC) 10 MG capsule Take 10 mg by mouth daily.   Yes [provider]  Turner as directed. 07/15/11   [provider]  EPINEPHrine (EPI-PEN) 0.3 mg/0.3 mL DEVI Inject 0.3 mg into the muscle once as needed.    [provider]    Allergies    Clarithromycin and Penicillins  Review of Systems   Review of Systems  Constitutional: Negative for chills, fatigue and fever.  HENT: Negative for congestion.   Eyes: Negative for visual disturbance.  Respiratory: Negative for chest tightness, shortness of breath and wheezing.   Cardiovascular: Negative for chest pain.  Gastrointestinal: Negative for abdominal pain (r flank tenderness with bruising), constipation, diarrhea, nausea and vomiting.  Genitourinary: Positive for flank pain. Negative for dysuria and frequency.  Musculoskeletal: Positive for back pain. Negative for neck pain and neck stiffness.  Skin: Positive for color change (bruising). Negative for wound.  Neurological: Negative for weakness, light-headedness, numbness and headaches.  Psychiatric/Behavioral: Negative for agitation and confusion.  All other systems reviewed and are negative.   Physical Exam Updated Vital Signs BP (!) 183/98 (BP Location: Right Arm)   Pulse (!) 58   Temp 97.8 F (36.6 C) (Oral)   Resp 16   Ht 5' 9.5" (1.765 m)   Wt 93.9 kg   SpO2 99%   BMI 30.13 kg/m   Physical Exam Vitals and nursing note reviewed.  Constitutional:      General: He is not in acute distress.    Appearance: He is well-developed. He is not ill-appearing,  toxic-appearing or diaphoretic.  HENT:     Head: Normocephalic and atraumatic.     Nose: No congestion or rhinorrhea.     Mouth/Throat:     Mouth: Mucous membranes are moist.     Pharynx: No oropharyngeal exudate or posterior oropharyngeal erythema.  Eyes:     Conjunctiva/sclera: Conjunctivae normal.     Pupils: Pupils are equal, round, and reactive to light.  Cardiovascular:     Rate and Rhythm: Normal rate and regular rhythm.     Heart sounds: No murmur heard.   Pulmonary:     Effort: Pulmonary effort is normal. No respiratory distress.     Breath sounds: Normal breath sounds. No wheezing, rhonchi or rales.  Chest:     Chest wall: No tenderness.  Abdominal:     General: Abdomen is flat.     Palpations: Abdomen is soft.     Tenderness: There is no abdominal tenderness. There is no right CVA tenderness, left CVA tenderness, guarding or rebound.  Musculoskeletal:        General: Tenderness present. No deformity.     Cervical back: Neck supple. No tenderness.       Back:     Right lower leg: No edema.     Left lower leg: No edema.     Comments: Normal strength and symmetric sensation in legs.  No report of incontinence.  Skin:    General: Skin is warm and dry.     Capillary Refill: Capillary refill takes less than 2 seconds.     Findings: Bruising present. No erythema.  Neurological:     General: No focal deficit present.     Mental Status: He is alert.  Psychiatric:        Mood and Affect: Mood normal.     ED Results / Procedures / Treatments   Labs (all labs ordered are listed, but only abnormal results are displayed) Labs Reviewed  COMPREHENSIVE METABOLIC PANEL - Abnormal; Notable for the following components:      Result Value   Sodium 131 (*)    Chloride 96 (*)    All other components within normal limits  CBC WITH DIFFERENTIAL/PLATELET  LIPASE, BLOOD  PROTIME-INR  MULTIPLE MYELOMA PANEL, SERUM  TROPONIN I (HIGH SENSITIVITY)    EKG None  Radiology DG  Chest 2 View  Result Date: 02/02/2021 CLINICAL DATA:  Chest pain. Bruise on the RIGHT side of the abdomen. No blood thinners. EXAM: CHEST - 2 VIEW COMPARISON:  05/16/2017 FINDINGS: Heart size is normal. There mildly prominent interstitial markings and perihilar peribronchial thickening. No RIGHT consolidations. No pleural effusions or pulmonary edema. Multiple air-fluid levels are identified within the abdomen. Small bowel loops are mildly prominent. There is no evidence for free intraperitoneal air. IMPRESSION: 1. Chronic bronchitic changes. 2. Question of bowel obstruction with air-fluid levels and mildly prominent loops  of small bowel in the UPPER abdomen. Recommend two-view abdomen and/or CT of the abdomen and pelvis for further characterization. Electronically Signed   By: Nolon Nations M.D.   On: 02/02/2021 14:33   CT ABDOMEN PELVIS W CONTRAST  Result Date: 02/02/2021 CLINICAL DATA:  Bruise on the right flank. No hx surg to a/p, no recent trauma, denies HX kidney stone or diverticulitis. Denies blood in urine or stool. EXAM: CT ABDOMEN AND PELVIS WITH CONTRAST TECHNIQUE: Multidetector CT imaging of the abdomen and pelvis was performed using the standard protocol following bolus administration of intravenous contrast. CONTRAST:  155m OMNIPAQUE IOHEXOL 300 MG/ML  SOLN COMPARISON:  None. FINDINGS: Lower chest: Small subpleural reticular opacities in the bilateral lung bases. Additionally there is a partially visualize subpleural nodule in the right middle lobe measuring 0.4 cm. Hepatobiliary: There are a couple of tiny hypodensities in the [place] which are too small fully characterize but likely represent small cysts. No additional liver lesion identified. No intra or extrahepatic biliary duct dilation. Normal appearance of the gallbladder. Pancreas: Unremarkable. No pancreatic ductal dilatation or surrounding inflammatory changes. Spleen: Normal in size without focal abnormality. Adrenals/Urinary  Tract: Adrenal glands are unremarkable. Kidneys are normal, without renal calculi, focal lesion, or hydronephrosis. Bladder is unremarkable. Stomach/Bowel: Stomach is within normal limits. Appendix appears normal. No evidence of bowel wall thickening, distention, or inflammatory changes. Multiple colonic diverticula without evidence of diverticulitis. Vascular/Lymphatic: Aortic atherosclerosis. No enlarged abdominal or pelvic lymph nodes. Is Reproductive: Prostate is unremarkable. Other: No abdominal wall hernia. There is subtle fat stranding along right flank (series 2, image 46), possibly at the site of patient's bruising. No fluid collection or mass identified. No abdominopelvic ascites. Musculoskeletal: Severe degenerative disc disease with grade 1 anterolisthesis at L5-S1, similar to recent prior lumbar spine radiographs. There are multiple small lytic lesions in the thoracolumbar spine and bilateral pelvis. No acute osseous abnormality visualized. Are IMPRESSION: 1. Nonspecific subtle fat stranding along the right flank, possibly at the site of patient's bruising. No fluid collection or mass identified. 2. Multiple small lytic lesions in the thoracolumbar spine and bilateral pelvis, concerning for multiple myeloma or metastatic disease. No evidence of primary disease in the abdomen or pelvis. 3. Subtle reticular opacities in the bilateral lung bases concerning for early fibrosis. Partially visualized subpleural nodule in the right middle lobe measuring 0.4 cm. 4. Colonic diverticulosis without evidence of diverticulitis. 5. Aortic atherosclerosis. Aortic Atherosclerosis (ICD10-I70.0). Electronically Signed   By: NAudie PintoM.D.   On: 02/02/2021 14:43    Procedures Procedures   Medications Ordered in ED Medications  iohexol (OMNIPAQUE) 300 MG/ML solution 100 mL (100 mLs Intravenous Contrast Given 02/02/21 1357)    ED Course  I have reviewed the triage vital signs and the nursing  notes.  Pertinent labs & imaging results that were available during my care of the patient were reviewed by me and considered in my medical decision making (see chart for details).    MDM Rules/Calculators/A&P                          VROGERS DITTERis a 65y.o. male with a past medical history significant for thyroid disease, hypercholesterolemia, and asthma who presents with right flank bruising and pain.  Patient reports that several days ago while patient was going to get a massage, his masseuse noticed bruising on his right flank that patient did not know about.  He reported a mild  ache in this location but thought it would go away.  He went back yesterday to see the masseuse and the bruise appears to have grown and have multiple areas now with bruising.  Patient reports no history of bleeding disorder and is not on blood thinners.  Patient reports he has had some more abdominal aching and right flank aching in the right lower quadrant and right flank.  He denies any CVA pain.  Denies any change in his urine.  Denies any constipation or diarrhea.  Denies any trauma to his knowledge.  He does report occasional rectal bleeding with hard stools but denies any other complaints.  He denies any fevers, chills, chest pain, shortness breath, palpitations, lightheadedness, or syncope.  He was told by a friend that he needed to be evaluated for gallbladder etiology or other concerning causes of this significant bruising and pain.  On exam, patient does have several areas of bruising on his right flank with some underlying tenderness.  There is no fluctuance but there is some hardness underlying.  Right lower quadrant is slightly tender to palpation but right upper quadrant is minimally tender.  Lungs clear and chest nontender.  Upper back nontender.  CVA areas nontender.  Rest of abdomen unremarkable.  Normal bowel sounds.  Patient otherwise well-appearing.  Given the patient's new bruising in the right  abdomen and right flank pain, we did discuss the possibility of a large retroperitoneal hematoma versus other causes.  We will get a CT scan, urinalysis, and labs.  We will make sure he does not have low platelets or significant anemia.  Anticipate reassessment after work-up to determine disposition.  Patient laboratory testing only showed mild hyponatremia and hypochloremia.  No evidence of AKI.  Troponin negative.  Chest x-ray showed bronchitic changes with no evidence of pneumothorax or other acute abnormality.  Question bowel traction and upper abdomen however CT scan was also performed and shows no obstruction.  No evidence of large retroperitoneal hematoma however it does show some lytic lesions any throughout the lumbar spine and pelvis.  Radiology was concerned about this being multiple myeloma or metastatic disease.  The patient had some small fat stranding near the area of bruising and I suspect this is the cause of the bruising.  Unclear etiology of this but likely mild trauma  I spoke with Dr. Marin Olp with oncology about this and asked him if there was anything he needed to have ordered today to help facilitate further work-up.  He recommended getting a myeloma panel, LDH, and a PSA.  He request the patient follow-up with oncology likely seeing Dr. Benay Spice next week to discuss further management but given his otherwise reassuring work-up, he is safe for discharge home.  I had a long discussion with the patient about this and we feel he is safe for discharge home.  He will follow-up with the oncology team next week.    Final Clinical Impression(s) / ED Diagnoses Final diagnoses:  Lytic lesion of bone on x-ray  Bruising  Flank pain    Rx / DC Orders ED Discharge Orders         Ordered    Ambulatory referral to Hematology / Oncology       Comments: Consulted Dr. Marin Olp for new lytic lesions in the spine and pelvis and he recommended follow-up with Dr. Benay Spice next week for further  evaluation management. thank you.   02/02/21 1532          Clinical Impression: 1. Lytic lesion  of bone on x-ray   2. Bruising   3. Flank pain     Disposition: Discharge  Condition: Good  I have discussed the results, Dx and Tx plan with the pt(& family if present). He/she/they expressed understanding and agree(s) with the plan. Discharge instructions discussed at great length. Strict return precautions discussed and pt &/or family have verbalized understanding of the instructions. No further questions at time of discharge.    New Prescriptions   No medications on file    Follow Up: Ladell Pier, MD [place] [address] [phone]   call [phone] or the other number provided to set up an appointment with Dr. Malachy Mood but tell them you would like to be seen at the [facility] facility if you want to be seen here.     Jordann Grime, Gwenyth Allegra, MD 02/02/21 [phone]

## 2021-02-02 NOTE — Discharge Instructions (Signed)
Your presentation today with non-traumatic bruising was atypical enough for [place] to get screening labs and imaging to further evaluate for concerning etiology.  We did not see any evidence of hematoma, infection, or intra-abdominal pathology however, the imaging did reveal lytic lesions in your thoracolumbar spine and your bilateral pelvis concerning for possible multiple myeloma versus metastatic disease of some kind.  I spoke with the oncology team who requested we get several labs sent off and follow-up with oncology in clinic next week.  At that visit, they will discuss further management and work-up to determine what the abnormalities are on your imaging which also may explain some of the back pain you have had for the last few months.  The oncologist that was recommended was Dr. Betsy Coder who does have a clinic here at this facility eyedrop regimen.  Please call to schedule that appointment.  If you develop any new or worsening symptoms, please return to the nearest emergency department immediately.

## 2021-02-02 NOTE — ED Notes (Signed)
Patient transported to CT and XR 

## 2021-02-06 ENCOUNTER — Telehealth: Payer: Self-pay | Admitting: Hematology and Oncology

## 2021-02-06 NOTE — Telephone Encounter (Signed)
Received a new pt referral from [facility] ED for lytic bone lesions. Samuel [name] has been cld and scheduled to see Dr. [name] on 4/27 at 9am. Pt aware to arrive 20 minutes early.

## 2021-02-09 LAB — MULTIPLE MYELOMA PANEL, SERUM
Albumin SerPl Elph-Mcnc: 4.1 g/dL (ref 2.9–4.4)
Albumin/Glob SerPl: 1 (ref 0.7–1.7)
Alpha 1: 0.2 g/dL (ref 0.0–0.4)
Alpha2 Glob SerPl Elph-Mcnc: 0.8 g/dL (ref 0.4–1.0)
B-Globulin SerPl Elph-Mcnc: 1.1 g/dL (ref 0.7–1.3)
Gamma Glob SerPl Elph-Mcnc: 2.1 g/dL — ABNORMAL HIGH (ref 0.4–1.8)
Globulin, Total: 4.2 g/dL — ABNORMAL HIGH (ref 2.2–3.9)
IgA: 194 mg/dL (ref 61–437)
IgG (Immunoglobin G), Serum: 2082 mg/dL — ABNORMAL HIGH (ref 603–1613)
IgM (Immunoglobulin M), Srm: 80 mg/dL (ref 20–172)
M Protein SerPl Elph-Mcnc: 1.8 g/dL — ABNORMAL HIGH
Total Protein ELP: 8.3 g/dL (ref 6.0–8.5)

## 2021-02-12 ENCOUNTER — Telehealth: Payer: Self-pay | Admitting: *Deleted

## 2021-02-12 NOTE — Telephone Encounter (Signed)
Received vm message from patient. He has tested + for covid on 02/04/21. He wants to know if it is ok for him to come for his 1st appt on 02/14/21.  [name] patient. No answer but was able to leave vm message for him and advisd that it will be ok for him to come in for his appt on 02/14/21 as it be 10 days since his initial diagnosis

## 2021-02-14 ENCOUNTER — Inpatient Hospital Stay: Payer: Managed Care, Other (non HMO)

## 2021-02-14 ENCOUNTER — Other Ambulatory Visit: Payer: Self-pay

## 2021-02-14 ENCOUNTER — Encounter: Payer: Self-pay | Admitting: Physician Assistant

## 2021-02-14 ENCOUNTER — Inpatient Hospital Stay: Payer: Managed Care, Other (non HMO) | Attending: Hematology and Oncology

## 2021-02-14 ENCOUNTER — Inpatient Hospital Stay (HOSPITAL_BASED_OUTPATIENT_CLINIC_OR_DEPARTMENT_OTHER): Payer: Managed Care, Other (non HMO) | Admitting: Physician Assistant

## 2021-02-14 ENCOUNTER — Telehealth: Payer: Self-pay

## 2021-02-14 ENCOUNTER — Inpatient Hospital Stay: Payer: Managed Care, Other (non HMO) | Admitting: Hematology and Oncology

## 2021-02-14 VITALS — BP 146/85 | HR 58 | Temp 97.6°F | Resp 16 | Ht 69.5 in | Wt 214.6 lb

## 2021-02-14 DIAGNOSIS — M899 Disorder of bone, unspecified: Secondary | ICD-10-CM | POA: Insufficient documentation

## 2021-02-14 DIAGNOSIS — E039 Hypothyroidism, unspecified: Secondary | ICD-10-CM | POA: Diagnosis not present

## 2021-02-14 DIAGNOSIS — E7800 Pure hypercholesterolemia, unspecified: Secondary | ICD-10-CM

## 2021-02-14 DIAGNOSIS — Z79899 Other long term (current) drug therapy: Secondary | ICD-10-CM

## 2021-02-14 DIAGNOSIS — Z87891 Personal history of nicotine dependence: Secondary | ICD-10-CM | POA: Insufficient documentation

## 2021-02-14 DIAGNOSIS — D472 Monoclonal gammopathy: Secondary | ICD-10-CM

## 2021-02-14 LAB — CBC WITH DIFFERENTIAL (CANCER CENTER ONLY)
Abs Immature Granulocytes: 0.01 10*3/uL (ref 0.00–0.07)
Basophils Absolute: 0 10*3/uL (ref 0.0–0.1)
Basophils Relative: 0 %
Eosinophils Absolute: 0.3 10*3/uL (ref 0.0–0.5)
Eosinophils Relative: 7 %
HCT: 43.3 % (ref 39.0–52.0)
Hemoglobin: 15 g/dL (ref 13.0–17.0)
Immature Granulocytes: 0 %
Lymphocytes Relative: 28 %
Lymphs Abs: 1.3 10*3/uL (ref 0.7–4.0)
MCH: 31.4 pg (ref 26.0–34.0)
MCHC: 34.6 g/dL (ref 30.0–36.0)
MCV: 90.6 fL (ref 80.0–100.0)
Monocytes Absolute: 0.4 10*3/uL (ref 0.1–1.0)
Monocytes Relative: 8 %
Neutro Abs: 2.7 10*3/uL (ref 1.7–7.7)
Neutrophils Relative %: 57 %
Platelet Count: 237 10*3/uL (ref 150–400)
RBC: 4.78 MIL/uL (ref 4.22–5.81)
RDW: 12.2 % (ref 11.5–15.5)
WBC Count: 4.7 10*3/uL (ref 4.0–10.5)
nRBC: 0 % (ref 0.0–0.2)

## 2021-02-14 LAB — CMP (CANCER CENTER ONLY)
ALT: 23 U/L (ref 0–44)
AST: 29 U/L (ref 15–41)
Albumin: 4.1 g/dL (ref 3.5–5.0)
Alkaline Phosphatase: 85 U/L (ref 38–126)
Anion gap: 9 (ref 5–15)
BUN: 14 mg/dL (ref 8–23)
CO2: 26 mmol/L (ref 22–32)
Calcium: 9.4 mg/dL (ref 8.9–10.3)
Chloride: 96 mmol/L — ABNORMAL LOW (ref 98–111)
Creatinine: 0.98 mg/dL (ref 0.61–1.24)
GFR, Estimated: >60 mL/min (ref >60)
Glucose, Bld: 94 mg/dL (ref 70–99)
Potassium: 4.8 mmol/L (ref 3.5–5.1)
Sodium: 131 mmol/L — ABNORMAL LOW (ref 135–145)
Total Bilirubin: 0.4 mg/dL (ref 0.3–1.2)
Total Protein: 8.4 g/dL — ABNORMAL HIGH (ref 6.5–8.1)

## 2021-02-14 LAB — LACTATE DEHYDROGENASE: LDH: 206 U/L — ABNORMAL HIGH (ref 98–192)

## 2021-02-14 NOTE — Telephone Encounter (Signed)
Contacted pt to let him know date and time of bone marrow biopsy. Pt verbalized understanding.

## 2021-02-14 NOTE — Progress Notes (Signed)
[place] [phone]   Fax:([phone] [place] NOTE  Patient Care Team: Antony Contras, MD as PCP - General (Family Medicine)  Hematological/Oncological History 1) 02/02/2021:  Presented to [place] ED due to right sided flank tenderness with bruising. -CT abdomen/pelvis: Multiple small lytic lesions in the thoracolumbar spine and bilateral pelvis -SPEP: IgG 2,082 (H), M Protein 1.8 (H). IFE shows IgG monoclonal protein with kappa light chain specificity.  -LDH 169, CBC normal, CMP normal except for sodium 131 (L), Chloride 96 (L).   2) 02/14/2021: Establish care with Dede Query PA-C  CHIEF COMPLAINTS/PURPOSE OF CONSULTATION:  "Lytic Lesions "  HISTORY OF PRESENTING ILLNESS:  Samuel [name] 65 y.o. male with medical history significant for hypothyroidism, hyperlipidemia and seasonal allergies. Patient is accompanied by his wife for this visit.   On exam today, Samuel [name] reports that his energy levels are stable. He continues to complete his daily activities on his own. He denies any changes to his appetite or noticeable weight loss. Patient denies any nausea, vomiting or abdominal pain. 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 He was tested positive for COVID on 02/04/2021. His COVID symptoms only included a cough that resolved shortly after. Patient denies any nausea, vomiting or abdominal pain. He has daily bowel movements without any hematochezia or melena. He denies any fevers, chills, night sweats, shortness of breath, chest pain or cough. Patient has no other symptoms. Rest of the 10 point ROS is below.    MEDICAL HISTORY:  Past Medical History:  Diagnosis Date  . Asthma   . High cholesterol    under control.   Marland Kitchen Perennial allergic rhinitis   . Sciatic pain, right   . Seasonal allergic rhinitis   . Thyroid disease     SURGICAL HISTORY: Past Surgical History:  Procedure Laterality Date  . NASAL SINUS SURGERY     . NECK SURGERY  1996    SOCIAL HISTORY: Social History   Socioeconomic History  . Marital status: Married    Spouse name: Not on file  . Number of children: Not on file  . Years of education: Not on file  . Highest education level: Not on file  Occupational History  . Not on file  Tobacco Use  . Smoking status: Former Smoker    Packs/day: 0.10    Years: 5.00    Pack years: 0.50    Quit date: 10/21/1982    Years since quitting: 38.3  . Smokeless tobacco: Never Used  Substance and Sexual Activity  . Alcohol use: Yes    Comment: 1 drink daily  . Drug use: No  . Sexual activity: Not on file  Other Topics Concern  . Not on file  Social History Narrative  . Not on file   Social Determinants of Health   Financial Resource Strain: Not on file  Food Insecurity: Not on file  Transportation Needs: Not on file  Physical Activity: Not on file  Stress: Not on file  Social Connections: Not on file  Intimate Partner Violence: Not on file    FAMILY HISTORY: Family History  Problem Relation Age of Onset  . Allergies Father   . Allergies Mother   . Hypertension Other   . Heart disease Other   . Breast cancer Paternal Grandmother     ALLERGIES:  is allergic to clarithromycin and penicillins.  MEDICATIONS:  Current Outpatient Medications  Medication Sig Dispense Refill  . ALPRAZolam (XANAX) 1 MG tablet  Take 1 mg by mouth 2 (two) times daily as needed for anxiety.    Marland Kitchen levothyroxine (SYNTHROID) 150 MCG tablet Take 150 mcg by mouth every other day. Alternates with 175 mcg    . montelukast (SINGULAIR) 10 MG tablet Take 10 mg by mouth at bedtime.    Marland Kitchen allopurinol (ZYLOPRIM) 100 MG tablet Take 100 mg by mouth daily.    . ASSESS FULL RANGE PEAK METER DEVI as directed.    . cholecalciferol (VITAMIN D) 1000 UNITS tablet Take 3,000 Units by mouth daily.    Marland Kitchen EPINEPHrine (EPI-PEN) 0.3 mg/0.3 mL DEVI Inject 0.3 mg into the muscle once as needed.    . ezetimibe-simvastatin (VYTORIN)  10-40 MG per tablet Take 1 tablet by mouth at bedtime.    . Multiple Vitamin (MULTIVITAMIN) tablet Take 1 tablet by mouth daily.    Marland Kitchen omeprazole (PRILOSEC) 10 MG capsule Take 10 mg by mouth daily.     No current facility-administered medications for this visit.    REVIEW OF SYSTEMS:   Constitutional: ( - ) fevers, ( - )  chills , ( - ) night sweats Eyes: ( - ) blurriness of vision, ( - ) double vision, ( - ) watery eyes Ears, nose, mouth, throat, and face: ( - ) mucositis, ( - ) sore throat Respiratory: ( - ) cough, ( - ) dyspnea, ( - ) wheezes Cardiovascular: ( - ) palpitation, ( - ) chest discomfort, ( - ) lower extremity swelling Gastrointestinal:  ( - ) nausea, ( - ) heartburn, ( - ) change in bowel habits Skin: ( - ) abnormal skin rashes Lymphatics: ( - ) new lymphadenopathy, ( - ) easy bruising Neurological: ( - ) numbness, ( - ) tingling, ( - ) new weaknesses Behavioral/Psych: ( - ) mood change, ( - ) new changes  All other systems were reviewed with the patient and are negative.  PHYSICAL EXAMINATION: ECOG PERFORMANCE STATUS: 1 - Symptomatic but completely ambulatory  Vitals:   02/14/21 0938  BP: (!) 146/85  Pulse: (!) 58  Resp: 16  Temp: 97.6 F (36.4 C)  SpO2: 100%   Filed Weights   02/14/21 0938  Weight: 214 lb 9.6 oz (97.3 kg)    GENERAL: well appearing male in NAD  SKIN: skin color, texture, turgor are normal, no rashes or significant lesions EYES: conjunctiva are pink and non-injected, sclera clear OROPHARYNX: no exudate, no erythema; lips, buccal mucosa, and tongue normal  NECK: supple, non-tender LYMPH:  no palpable lymphadenopathy in the cervical, axillary or supraclavicular lymph nodes.  LUNGS: clear to auscultation and percussion with normal breathing effort HEART: regular rate & rhythm and no murmurs and no lower extremity edema ABDOMEN: soft, non-tender, non-distended, normal bowel sounds Musculoskeletal: no cyanosis of digits and no clubbing   PSYCH: alert & oriented x 3, fluent speech NEURO: no focal motor/sensory deficits  LABORATORY DATA:  I have reviewed the data as listed CBC Latest Ref Rng & Units 02/14/2021 02/02/2021  WBC 4.0 - 10.5 K/uL 4.7 5.3  Hemoglobin 13.0 - 17.0 g/dL 15.0 14.0  Hematocrit 39.0 - 52.0 % 43.3 41.2  Platelets 150 - 400 K/uL 237 215    CMP Latest Ref Rng & Units 02/14/2021 02/02/2021  Glucose 70 - 99 mg/dL 94 86  BUN 8 - 23 mg/dL 14 20  Creatinine 0.61 - 1.24 mg/dL 0.98 1.07  Sodium 135 - 145 mmol/L 131(L) 131(L)  Potassium 3.5 - 5.1 mmol/L 4.8 4.4  Chloride 98 - 111  mmol/L 96(L) 96(L)  CO2 22 - 32 mmol/L 26 29  Calcium 8.9 - 10.3 mg/dL 9.4 9.4  Total Protein 6.5 - 8.1 g/dL 8.4(H) 7.8  Total Bilirubin 0.3 - 1.2 mg/dL 0.4 0.4  Alkaline Phos 38 - 126 U/L 85 67  AST 15 - 41 U/L 29 25  ALT 0 - 44 U/L 23 21    RADIOGRAPHIC STUDIES: I have personally reviewed the radiological images as listed and agreed with the findings in the report. DG Chest 2 View  Result Date: 02/02/2021 CLINICAL DATA:  Chest pain. Bruise on the RIGHT side of the abdomen. No blood thinners. EXAM: CHEST - 2 VIEW COMPARISON:  05/16/2017 FINDINGS: Heart size is normal. There mildly prominent interstitial markings and perihilar peribronchial thickening. No RIGHT consolidations. No pleural effusions or pulmonary edema. Multiple air-fluid levels are identified within the abdomen. Small bowel loops are mildly prominent. There is no evidence for free intraperitoneal air. IMPRESSION: 1. Chronic bronchitic changes. 2. Question of bowel obstruction with air-fluid levels and mildly prominent loops of small bowel in the UPPER abdomen. Recommend two-view abdomen and/or CT of the abdomen and pelvis for further characterization. Electronically Signed   By: Nolon Nations M.D.   On: 02/02/2021 14:33   CT ABDOMEN PELVIS W CONTRAST  Result Date: 02/02/2021 CLINICAL DATA:  Bruise on the right flank. No hx surg to a/p, no recent trauma, denies HX  kidney stone or diverticulitis. Denies blood in urine or stool. EXAM: CT ABDOMEN AND PELVIS WITH CONTRAST TECHNIQUE: Multidetector CT imaging of the abdomen and pelvis was performed using the standard protocol following bolus administration of intravenous contrast. CONTRAST:  13m OMNIPAQUE IOHEXOL 300 MG/ML  SOLN COMPARISON:  None. FINDINGS: Lower chest: Small subpleural reticular opacities in the bilateral lung bases. Additionally there is a partially visualize subpleural nodule in the right middle lobe measuring 0.4 cm. Hepatobiliary: There are a couple of tiny hypodensities in the [place] which are too small fully characterize but likely represent small cysts. No additional liver lesion identified. No intra or extrahepatic biliary duct dilation. Normal appearance of the gallbladder. Pancreas: Unremarkable. No pancreatic ductal dilatation or surrounding inflammatory changes. Spleen: Normal in size without focal abnormality. Adrenals/Urinary Tract: Adrenal glands are unremarkable. Kidneys are normal, without renal calculi, focal lesion, or hydronephrosis. Bladder is unremarkable. Stomach/Bowel: Stomach is within normal limits. Appendix appears normal. No evidence of bowel wall thickening, distention, or inflammatory changes. Multiple colonic diverticula without evidence of diverticulitis. Vascular/Lymphatic: Aortic atherosclerosis. No enlarged abdominal or pelvic lymph nodes. Is Reproductive: Prostate is unremarkable. Other: No abdominal wall hernia. There is subtle fat stranding along right flank (series 2, image 46), possibly at the site of patient's bruising. No fluid collection or mass identified. No abdominopelvic ascites. Musculoskeletal: Severe degenerative disc disease with grade 1 anterolisthesis at L5-S1, similar to recent prior lumbar spine radiographs. There are multiple small lytic lesions in the thoracolumbar spine and bilateral pelvis. No acute osseous abnormality visualized. Are IMPRESSION:  1. Nonspecific subtle fat stranding along the right flank, possibly at the site of patient's bruising. No fluid collection or mass identified. 2. Multiple small lytic lesions in the thoracolumbar spine and bilateral pelvis, concerning for multiple myeloma or metastatic disease. No evidence of primary disease in the abdomen or pelvis. 3. Subtle reticular opacities in the bilateral lung bases concerning for early fibrosis. Partially visualized subpleural nodule in the right middle lobe measuring 0.4 cm. 4. Colonic diverticulosis without evidence of diverticulitis. 5. Aortic atherosclerosis. Aortic Atherosclerosis (ICD10-I70.0). Electronically Signed  By: Audie Pinto M.D.   On: 02/02/2021 14:43    ASSESSMENT & PLAN Samuel [name] is a 65 y.o. male presenting to the clinic for evaluation for lytic lesions. I reviewed recent SPEP with IFE from 02/02/2021 that revealed M-protein of 1.8 and elevated IgG level of 2,082. IFE indicated IgG monoclonal protein with kappa light chain specificity. In addition, CT scan of the abdomen/pelvis indicated multiple small lytic lesions in the thoracolumbar spine and bilateral pelvis. Based on above results, this is concerning for multiple myeloma so we will proceed with bone marrow biopsy. In addition, we recommend to complete workup with labs to check CBC, CMP, LDH, Beta 2 microglobulin, serum free light chain and UPEP. Lastly, a bone met survey will be obtained to evaluate for other lytic lesions.  Patient will return to the clinic in 2 weeks to see Dr. [name] to finalize diagnosis.  #Lytic lesions: --SPEP with IFE on 02/02/2021 indicates M-protein of 1.8 and elevated IgG level of 2,082. IFE indicated IgG monoclonal protein with kappa light chain specificity. --CT Abdomen/pelvis from 02/02/2021 shows multiple small lytic lesions in the thoracolumbar spine and bilateral pelvis --Proceed with workup which includes labs today to check CBC, CMP, LDH, Beta-2 microglobulin,  Serum Free Light Chains and 24 hour UPEP.  --Need DG bone met survey to assess for additional lytic lesions.  --Need bone marrow biopsy since there are CRAB features with lytic lesions.  --RTC in 2 weeks with labs to see Dr. [name].    Orders Placed This Encounter  Procedures  . DG Bone Survey Met    Standing Status:   Future    Standing Expiration Date:   02/14/2022    Order Specific Question:   Reason for Exam (SYMPTOM  OR DIAGNOSIS REQUIRED)    Answer:   evaluate for lytic lesions.    Order Specific Question:   Preferred imaging location?    Answer:   [facility]  . CBC with Differential ([place] Only)    Standing Status:   Future    Number of Occurrences:   1    Standing Expiration Date:   02/14/2022  . CMP ([facility] only)    Standing Status:   Future    Number of Occurrences:   1    Standing Expiration Date:   02/14/2022  . Lactate dehydrogenase (LDH)    Standing Status:   Future    Number of Occurrences:   1    Standing Expiration Date:   02/14/2022  . Beta 2 microglobulin    Standing Status:   Future    Number of Occurrences:   1    Standing Expiration Date:   02/14/2022  . 24-Hr Ur UPEP/UIFE/Light Chains/TP    Standing Status:   Future    Standing Expiration Date:   02/14/2022  . Kappa/lambda light chains    Standing Status:   Future    Number of Occurrences:   1    Standing Expiration Date:   02/14/2022    All questions were answered. The patient knows to call the clinic with any problems, questions or concerns.  A total of more than 60 minutes were spent on this encounter and over half of that time was spent on counseling and coordination of care as outlined above.    Dede Query, PA-C Department of Hematology/Oncology [place] at [facility] Phone: [phone]

## 2021-02-15 ENCOUNTER — Encounter: Payer: Managed Care, Other (non HMO) | Admitting: Hematology and Oncology

## 2021-02-15 ENCOUNTER — Other Ambulatory Visit: Payer: Managed Care, Other (non HMO)

## 2021-02-15 LAB — KAPPA/LAMBDA LIGHT CHAINS
Kappa free light chain: 45.1 mg/L — ABNORMAL HIGH (ref 3.3–19.4)
Kappa, lambda light chain ratio: 3.42 — ABNORMAL HIGH (ref 0.26–1.65)
Lambda free light chains: 13.2 mg/L (ref 5.7–26.3)

## 2021-02-15 LAB — BETA 2 MICROGLOBULIN, SERUM: Beta-2 Microglobulin: 1.6 mg/L (ref 0.6–2.4)

## 2021-02-16 ENCOUNTER — Telehealth: Payer: Self-pay | Admitting: Physician Assistant

## 2021-02-16 NOTE — Telephone Encounter (Signed)
Scheduled per los. Called and spoke with patient. Confirmed appt 

## 2021-02-21 ENCOUNTER — Other Ambulatory Visit: Payer: Self-pay

## 2021-02-21 ENCOUNTER — Ambulatory Visit (HOSPITAL_COMMUNITY)
Admission: RE | Admit: 2021-02-21 | Discharge: 2021-02-21 | Disposition: A | Discharge status: Home / Self Care | Payer: Managed Care, Other (non HMO) | Source: Ambulatory Visit | Attending: Physician Assistant | Admitting: Physician Assistant

## 2021-02-21 DIAGNOSIS — D472 Monoclonal gammopathy: Secondary | ICD-10-CM | POA: Diagnosis present

## 2021-02-22 ENCOUNTER — Inpatient Hospital Stay: Payer: Managed Care, Other (non HMO) | Attending: Hematology and Oncology | Admitting: Adult Health

## 2021-02-22 ENCOUNTER — Inpatient Hospital Stay: Payer: Managed Care, Other (non HMO)

## 2021-02-22 VITALS — BP 126/77 | HR 64 | Temp 97.8°F | Resp 16 | Wt 213.8 lb

## 2021-02-22 DIAGNOSIS — Z87891 Personal history of nicotine dependence: Secondary | ICD-10-CM | POA: Insufficient documentation

## 2021-02-22 DIAGNOSIS — C9000 Multiple myeloma not having achieved remission: Secondary | ICD-10-CM | POA: Insufficient documentation

## 2021-02-22 DIAGNOSIS — Z5112 Encounter for antineoplastic immunotherapy: Secondary | ICD-10-CM | POA: Diagnosis not present

## 2021-02-22 DIAGNOSIS — Z79899 Other long term (current) drug therapy: Secondary | ICD-10-CM | POA: Insufficient documentation

## 2021-02-22 DIAGNOSIS — D472 Monoclonal gammopathy: Secondary | ICD-10-CM

## 2021-02-22 DIAGNOSIS — Z7952 Long term (current) use of systemic steroids: Secondary | ICD-10-CM | POA: Diagnosis not present

## 2021-02-22 LAB — UPEP/UIFE/LIGHT CHAINS/TP, 24-HR UR
% BETA, Urine: 0 %
ALPHA 1 URINE: 0 %
Albumin, U: 100 %
Alpha 2, Urine: 0 %
Free Kappa Lt Chains,Ur: 1.93 mg/L (ref 1.17–86.46)
Free Kappa/Lambda Ratio: 1.36 — ABNORMAL LOW (ref 1.83–14.26)
Free Lambda Lt Chains,Ur: 1.42 mg/L (ref 0.27–15.21)
GAMMA GLOBULIN URINE: 0 %
Total Protein, Urine-Ur/day: <180 mg/24 hr — ABNORMAL HIGH (ref 30–150)
Total Protein, Urine: <4 mg/dL
Total Volume: 4500

## 2021-02-22 LAB — CBC WITH DIFFERENTIAL/PLATELET
Abs Immature Granulocytes: 0.03 10*3/uL (ref 0.00–0.07)
Basophils Absolute: 0 10*3/uL (ref 0.0–0.1)
Basophils Relative: 0 %
Eosinophils Absolute: 0.4 10*3/uL (ref 0.0–0.5)
Eosinophils Relative: 6 %
HCT: 38.3 % — ABNORMAL LOW (ref 39.0–52.0)
Hemoglobin: 13.2 g/dL (ref 13.0–17.0)
Immature Granulocytes: 1 %
Lymphocytes Relative: 18 %
Lymphs Abs: 1 10*3/uL (ref 0.7–4.0)
MCH: 31.6 pg (ref 26.0–34.0)
MCHC: 34.5 g/dL (ref 30.0–36.0)
MCV: 91.6 fL (ref 80.0–100.0)
Monocytes Absolute: 0.6 10*3/uL (ref 0.1–1.0)
Monocytes Relative: 10 %
Neutro Abs: 3.7 10*3/uL (ref 1.7–7.7)
Neutrophils Relative %: 65 %
Platelets: 228 10*3/uL (ref 150–400)
RBC: 4.18 MIL/uL — ABNORMAL LOW (ref 4.22–5.81)
RDW: 12.5 % (ref 11.5–15.5)
WBC: 5.7 10*3/uL (ref 4.0–10.5)
nRBC: 0 % (ref 0.0–0.2)

## 2021-02-22 MED ORDER — LIDOCAINE HCL 2 % IJ SOLN
INTRAMUSCULAR | Status: AC
  Filled 2021-02-22: qty 20

## 2021-02-22 NOTE — Patient Instructions (Signed)
Bone Marrow Aspiration and Bone Marrow Biopsy, Adult, Care After This sheet gives you information about how to care for yourself after your procedure. Your health care provider may also give you more specific instructions. If you have problems or questions, contact your health care provider. What can I expect after the procedure? After the procedure, it is common to have:  Mild pain and tenderness.  Swelling.  Bruising. Follow these instructions at home: Puncture site care  Follow instructions from your health care provider about how to take care of the puncture site. Make sure you: ? Wash your hands with soap and water before and after you change your bandage (dressing). If soap and water are not available, use hand sanitizer. ? Change your dressing as told by your health care provider.  Check your puncture site every day for signs of infection. Check for: ? More redness, swelling, or pain. ? Fluid or blood. ? Warmth. ? Pus or a bad smell.   Activity  Return to your normal activities as told by your health care provider. Ask your health care provider what activities are safe for you.  Do not lift anything that is heavier than 10 lb (4.5 kg), or the limit that you are told, until your health care provider says that it is safe.  Do not drive for 24 hours if you were given a sedative during your procedure. General instructions  Take over-the-counter and prescription medicines only as told by your health care provider.  Do not take baths, swim, or use a hot tub until your health care provider approves. Ask your health care provider if you may take showers. You may only be allowed to take sponge baths.  If directed, put ice on the affected area. To do this: ? Put ice in a plastic bag. ? Place a towel between your skin and the bag. ? Leave the ice on for 20 minutes, 2-3 times a day.  Keep all follow-up visits as told by your health care provider. This is important.   Contact a  health care provider if:  Your pain is not controlled with medicine.  You have a fever.  You have more redness, swelling, or pain around the puncture site.  You have fluid or blood coming from the puncture site.  Your puncture site feels warm to the touch.  You have pus or a bad smell coming from the puncture site. Summary  After the procedure, it is common to have mild pain, tenderness, swelling, and bruising.  Follow instructions from your health care provider about how to take care of the puncture site and what activities are safe for you.  Take over-the-counter and prescription medicines only as told by your health care provider.  Contact a health care provider if you have any signs of infection, such as fluid or blood coming from the puncture site. This information is not intended to replace advice given to you by your health care provider. Make sure you discuss any questions you have with your health care provider. Document Revised: 02/23/2019 Document Reviewed: 02/23/2019 Elsevier Patient Education  [place].

## 2021-02-22 NOTE — Progress Notes (Signed)
Pt presented today for a bone marrow procedure, performed by Wilber Bihari, NP, pt tolerated procedure well, VSS throughout . Pt stayed for 30 min observation period and left ambulatory, stable at discharge, with bandage clean dry and intact. AVS reviewed and all questions answered to pt satisfaction.

## 2021-02-22 NOTE — Progress Notes (Signed)
INDICATION: elevated Mspike, sclerotic bone lesions, concerns for multiple myeloma  Brief examination was performed. ENT: adequate airway clearance Heart: regular rate and rhythm.No Murmurs Lungs: clear to auscultation, no wheezes, normal respiratory effort  Bone Marrow Biopsy and Aspiration Procedure Note   Informed consent was obtained and potential risks including bleeding, infection and pain were reviewed with the patient.  The patient's name, date of birth, identification, consent and allergies were verified prior to the start of procedure and time out was performed.  The left posterior iliac crest was chosen as the site of biopsy.  The skin was prepped with ChloraPrep.   12 cc of 2% lidocaine was used to provide local anaesthesia.   10 cc of bone marrow aspirate was obtained followed by 1cm biopsy.  Pressure was applied to the biopsy site and bandage was placed over the biopsy site. Patient was made to lie on the back for 30 mins prior to discharge.  The procedure was tolerated well. COMPLICATIONS: None BLOOD LOSS: none The patient was discharged home in stable condition with a 03/05/2021 follow up with Dr. [name] to review results.  Patient was provided with post bone marrow biopsy instructions and instructed to call if there was any bleeding or worsening pain.  Specimens sent for flow cytometry, cytogenetics and additional studies.  Signed Scot Dock, NP

## 2021-03-01 ENCOUNTER — Encounter (HOSPITAL_COMMUNITY): Payer: Self-pay | Admitting: Hematology and Oncology

## 2021-03-05 ENCOUNTER — Encounter (HOSPITAL_COMMUNITY): Payer: Self-pay | Admitting: Hematology and Oncology

## 2021-03-05 ENCOUNTER — Other Ambulatory Visit: Payer: Managed Care, Other (non HMO)

## 2021-03-05 ENCOUNTER — Encounter: Payer: Self-pay | Admitting: Hematology and Oncology

## 2021-03-05 ENCOUNTER — Other Ambulatory Visit: Payer: Self-pay

## 2021-03-05 ENCOUNTER — Inpatient Hospital Stay (HOSPITAL_BASED_OUTPATIENT_CLINIC_OR_DEPARTMENT_OTHER): Payer: Managed Care, Other (non HMO) | Admitting: Hematology and Oncology

## 2021-03-05 ENCOUNTER — Other Ambulatory Visit (HOSPITAL_COMMUNITY): Payer: Self-pay

## 2021-03-05 ENCOUNTER — Telehealth: Payer: Self-pay | Admitting: Pharmacist

## 2021-03-05 DIAGNOSIS — C9000 Multiple myeloma not having achieved remission: Secondary | ICD-10-CM | POA: Diagnosis not present

## 2021-03-05 DIAGNOSIS — Z5112 Encounter for antineoplastic immunotherapy: Secondary | ICD-10-CM | POA: Diagnosis not present

## 2021-03-05 LAB — SURGICAL PATHOLOGY

## 2021-03-05 MED ORDER — LENALIDOMIDE 25 MG PO CAPS
25.0000 mg | ORAL_CAPSULE | Freq: Every day | ORAL | 0 refills | Status: DC
Start: 2021-03-05 — End: 2021-03-06

## 2021-03-05 MED ORDER — PROCHLORPERAZINE MALEATE 10 MG PO TABS: 10.0000 mg | ORAL_TABLET | Freq: Four times a day (QID) | ORAL | 0 refills | Status: DC | PRN

## 2021-03-05 MED ORDER — ONDANSETRON HCL 8 MG PO TABS: 8.0000 mg | ORAL_TABLET | Freq: Three times a day (TID) | ORAL | 0 refills | Status: DC | PRN

## 2021-03-05 MED ORDER — ACYCLOVIR 400 MG PO TABS: 400.0000 mg | ORAL_TABLET | Freq: Two times a day (BID) | ORAL | 3 refills | Status: DC

## 2021-03-05 MED ORDER — DEXAMETHASONE 4 MG PO TABS: 40.0000 mg | ORAL_TABLET | ORAL | 3 refills | Status: DC

## 2021-03-05 NOTE — Progress Notes (Signed)
START ON PATHWAY REGIMEN - Multiple Myeloma and Other Plasma Cell Dyscrasias     A cycle is every 21 days:     Bortezomib      Lenalidomide      Dexamethasone   **Always confirm dose/schedule in your pharmacy ordering system**  Patient Characteristics: Multiple Myeloma, Newly Diagnosed, Transplant Eligible, Standard Risk Disease Classification: Multiple Myeloma R-ISS Staging: II Therapeutic Status: Newly Diagnosed Is Patient Eligible for Transplant<= Transplant Eligible Risk Status: Standard Risk Intent of Therapy: Curative Intent, Discussed with Patient 

## 2021-03-05 NOTE — Progress Notes (Signed)
[place] [phone]   [phone]  PROGRESS NOTE  Patient Care Team: Antony Contras, MD as PCP - General (Family Medicine)  Hematological/Oncological History # IgG Kappa Multiple Myeloma 02/02/2021:  Presented to [place] ED due to right sided flank tenderness with bruising. CT abdomen/pelvis: Multiple small lytic lesions in the thoracolumbar spine and bilateral pelvis -SPEP: IgG 2,082 (H), M Protein 1.8 (H). IFE shows IgG monoclonal protein with kappa light chain specificity.  -LDH 169, CBC normal, CMP normal except for sodium 131 (L), Chloride 96 (L).  02/14/2021: Establish care with Dede Query PA-C 02/22/2021: bone marrow biopsy confirms the diagnosis of Multiple Myeloma with a monoclonal plasma cell population.   Interval History:  Samuel [name] 65 y.o. male with medical history significant for IgG Kappa multiple myeloma who presents for a follow up visit. The patient's last visit was on 02/14/2021 at which time he established care. In the interim since the last visit he had a bone marrow biopsy which confirmed the diagnosis of  Multiple myeloma.   On exam today Samuel [name] is accompanied by his wife. He reports his been well in the interim since his last visit with Murray Hodgkins.  He has had no issues with fatigue, bleeding, bruising, or dark stools.  Denies any fevers, chills, sweats, nausea, vomiting or diarrhea.  A full 10 point ROS is listed below.  The bulk of our discussion today focused on the diagnosis and treatment of multiple myeloma. This conversation is detailed below.   MEDICAL HISTORY:  Past Medical History:  Diagnosis Date  . Asthma   . High cholesterol    under control.   Marland Kitchen Perennial allergic rhinitis   . Sciatic pain, right   . Seasonal allergic rhinitis   . Thyroid disease     SURGICAL HISTORY: Past Surgical History:  Procedure Laterality Date  . NASAL SINUS SURGERY    . NECK SURGERY  1996    SOCIAL HISTORY: Social  History   Socioeconomic History  . Marital status: Married    Spouse name: Not on file  . Number of children: Not on file  . Years of education: Not on file  . Highest education level: Not on file  Occupational History  . Not on file  Tobacco Use  . Smoking status: Former Smoker    Packs/day: 0.10    Years: 5.00    Pack years: 0.50    Quit date: 10/21/1982    Years since quitting: 38.3  . Smokeless tobacco: Never Used  Substance and Sexual Activity  . Alcohol use: Yes    Comment: 1 drink daily  . Drug use: No  . Sexual activity: Not on file  Other Topics Concern  . Not on file  Social History Narrative  . Not on file   Social Determinants of Health   Financial Resource Strain: Not on file  Food Insecurity: Not on file  Transportation Needs: Not on file  Physical Activity: Not on file  Stress: Not on file  Social Connections: Not on file  Intimate Partner Violence: Not on file    FAMILY HISTORY: Family History  Problem Relation Age of Onset  . Allergies Father   . Allergies Mother   . Hypertension Other   . Heart disease Other   . Breast cancer Paternal Grandmother     ALLERGIES:  is allergic to clarithromycin and penicillins.  MEDICATIONS:  Current Outpatient Medications  Medication Sig Dispense Refill  . acyclovir (ZOVIRAX) 400 MG tablet Take  1 tablet (400 mg total) by mouth 2 (two) times daily. 60 tablet 3  . dexamethasone (DECADRON) 4 MG tablet Take 10 tablets (40 mg total) by mouth once a week. 40 tablet 3  . ondansetron (ZOFRAN) 8 MG tablet Take 1 tablet (8 mg total) by mouth every 8 (eight) hours as needed for nausea or vomiting. 30 tablet 0  . prochlorperazine (COMPAZINE) 10 MG tablet Take 1 tablet (10 mg total) by mouth every 6 (six) hours as needed for nausea or vomiting. 30 tablet 0  . sildenafil (REVATIO) 20 MG tablet 2-5 tablets one hour prior to sexual intimacy    . Zinc 50 MG TABS Take by mouth.    Marland Kitchen allopurinol (ZYLOPRIM) 100 MG tablet Take 100  mg by mouth daily.    Marland Kitchen ALPRAZolam (XANAX) 1 MG tablet Take 1 mg by mouth 2 (two) times daily as needed for anxiety.    . ASSESS FULL RANGE PEAK METER DEVI as directed.    . cholecalciferol (VITAMIN D) 1000 UNITS tablet Take 3,000 Units by mouth daily.    Marland Kitchen EPINEPHrine (EPI-PEN) 0.3 mg/0.3 mL DEVI Inject 0.3 mg into the muscle once as needed.    . ezetimibe-simvastatin (VYTORIN) 10-40 MG per tablet Take 1 tablet by mouth at bedtime.    Marland Kitchen levothyroxine (SYNTHROID) 150 MCG tablet Take 150 mcg by mouth every other day. Alternates with 175 mcg    . montelukast (SINGULAIR) 10 MG tablet Take 10 mg by mouth at bedtime.    . Multiple Vitamin (MULTIVITAMIN) tablet Take 1 tablet by mouth daily.    Marland Kitchen omeprazole (PRILOSEC) 10 MG capsule Take 10 mg by mouth daily.    . tamsulosin (FLOMAX) 0.4 MG CAPS capsule 1 capsule    . triamcinolone cream (KENALOG) 0.1 % Apply topically.     No current facility-administered medications for this visit.    REVIEW OF SYSTEMS:   Constitutional: ( - ) fevers, ( - )  chills , ( - ) night sweats Eyes: ( - ) blurriness of vision, ( - ) double vision, ( - ) watery eyes Ears, nose, mouth, throat, and face: ( - ) mucositis, ( - ) sore throat Respiratory: ( - ) cough, ( - ) dyspnea, ( - ) wheezes Cardiovascular: ( - ) palpitation, ( - ) chest discomfort, ( - ) lower extremity swelling Gastrointestinal:  ( - ) nausea, ( - ) heartburn, ( - ) change in bowel habits Skin: ( - ) abnormal skin rashes Lymphatics: ( - ) new lymphadenopathy, ( - ) easy bruising Neurological: ( - ) numbness, ( - ) tingling, ( - ) new weaknesses Behavioral/Psych: ( - ) mood change, ( - ) new changes  All other systems were reviewed with the patient and are negative.  PHYSICAL EXAMINATION: ECOG PERFORMANCE STATUS: 0 - Asymptomatic  Vitals:   03/05/21 1037  BP: (!) 165/86  Pulse: 60  Resp: 17  Temp: 97.6 F (36.4 C)  SpO2: 100%   Filed Weights   03/05/21 1037  Weight: 216 lb 12.8 oz (98.3  kg)    GENERAL: well appearing middle aged Caucasian male alert, no distress and comfortable SKIN: skin color, texture, turgor are normal, no rashes or significant lesions EYES: conjunctiva are pink and non-injected, sclera clear LUNGS: clear to auscultation and percussion with normal breathing effort HEART: regular rate & rhythm and no murmurs and no lower extremity edema Musculoskeletal: no cyanosis of digits and no clubbing  PSYCH: alert & oriented x 3,  fluent speech NEURO: no focal motor/sensory deficits  LABORATORY DATA:  I have reviewed the data as listed CBC Latest Ref Rng & Units 02/22/2021 02/14/2021 02/02/2021  WBC 4.0 - 10.5 K/uL 5.7 4.7 5.3  Hemoglobin 13.0 - 17.0 g/dL 13.2 15.0 14.0  Hematocrit 39.0 - 52.0 % 38.3(L) 43.3 41.2  Platelets 150 - 400 K/uL 228 237 215    CMP Latest Ref Rng & Units 02/14/2021 02/02/2021  Glucose 70 - 99 mg/dL 94 86  BUN 8 - 23 mg/dL 14 20  Creatinine 0.61 - 1.24 mg/dL 0.98 1.07  Sodium 135 - 145 mmol/L 131(L) 131(L)  Potassium 3.5 - 5.1 mmol/L 4.8 4.4  Chloride 98 - 111 mmol/L 96(L) 96(L)  CO2 22 - 32 mmol/L 26 29  Calcium 8.9 - 10.3 mg/dL 9.4 9.4  Total Protein 6.5 - 8.1 g/dL 8.4(H) 7.8  Total Bilirubin 0.3 - 1.2 mg/dL 0.4 0.4  Alkaline Phos 38 - 126 U/L 85 67  AST 15 - 41 U/L 29 25  ALT 0 - 44 U/L 23 21    Lab Results  Component Value Date   MPROTEIN 1.8 (H) 02/02/2021   Lab Results  Component Value Date   KPAFRELGTCHN 45.1 (H) 02/14/2021   LAMBDASER 13.2 02/14/2021   KAPLAMBRATIO 1.36 (L) 02/20/2021   KAPLAMBRATIO 3.42 (H) 02/14/2021    RADIOGRAPHIC STUDIES: I have personally reviewed the radiological images as listed and agreed with the findings in the report: lytic lesions in the hip bones bilaterally.  DG Bone Survey Met  Result Date: 02/24/2021 CLINICAL DATA:  Bone survey to evaluate for lytic lesions. EXAM: METASTATIC BONE SURVEY COMPARISON:  CT abdomen dated 02/02/2021. FINDINGS: Skull: Questionable small lytic lesion at  the vertex of the calvarium. Cervical Spine: Normal bony mineralization. No focal lytic or blastic osseous lesion. Degenerative spondylosis throughout the cervical spine, with most prominent disc space narrowing and osseous spurring at the C3-4 and C6-7 levels, with associated chronic osseous fusion at the C5-6 level, and with associated reversal of the normal cervical spine lordosis. Thoracic Spine: Normal bony mineralization. No focal lytic or blastic osseous lesion. Mild degenerative spondylosis. Vertebral body wedging of multiple levels, of uncertain chronicity. Chest: Normal bony mineralization. No focal lytic or blastic osseous lesion. Coarse lung markings bilaterally suggesting some degree of underlying chronic interstitial lung disease/fibrosis and/or chronic bronchitic change. Lumbar Spine: Normal bony mineralization. No focal lytic or blastic osseous lesion. Mild dextroscoliosis. Degenerative spondylosis throughout, mild to moderate in degree. Chronic bilateral pars interarticularis defects at L5-S1 with resultant grade 1/2 spondylolisthesis. Pelvis: Lytic lesion within the LEFT iliac bone, adjacent to the LEFT SI joint, as demonstrated on earlier CT abdomen of 02/02/2021. Smaller lytic lesion within the RIGHT iliac bone, also demonstrated on earlier CT. No additional lytic-appearing lesions are identified within the osseous pelvis. Right Upper Extremity: Normal bony mineralization. No focal lytic or blastic osseous lesion. Left Upper Extremity: Normal bony mineralization. No focal lytic or blastic osseous lesion. Right Lower Extremity: Normal bony mineralization. No focal lytic or blastic osseous lesion. Left Lower Extremity: Normal bony mineralization. No focal lytic or blastic osseous lesion. IMPRESSION: 1. Lytic lesions redemonstrated within each iliac bone, as initially identified on recent CT abdomen and pelvis dated 02/02/2021. 2. Questionable additional lytic lesion at the vertex of the skull.  Consider head CT to confirm. 3. No additional lytic or blastic bone lesions identified. The small lytic lesions seen within the thoracolumbar spine on recent CT are apparently too small to be visualized by plain  film. 4. Degenerative spondylosis of the cervical, thoracic and lumbar spine, as detailed above. Multilevel vertebral body compression deformities of the spine, of uncertain chronicity, presumably related to the degenerative changes detailed above. 5. Coarse interstitial markings throughout both lungs, suggesting a chronic interstitial lung disease and/or chronic bronchitic change. 6. Chronic bilateral pars interarticularis defects at L5-S1 with resultant grade 1/2 spondylolisthesis. Electronically Signed   By: Franki Cabot M.D.   On: 02/24/2021 09:57    ASSESSMENT & PLAN Samuel [name] 65 y.o. male with medical history significant for IgG Kappa multiple myeloma who presents for a follow up visit.   After review of the labs, review of the records, and discussion with the patient the findings are most consistent with an IgG kappa multiple myeloma.  The patient has 2 lytic lesions within the pelvic bones as well as 10% plasma cells within the bone marrow biopsy.  This combined with his serological findings confirm the diagnosis of multiple myeloma.  The treatment of choice for this patient's multiple myeloma is VRd. This will consist of bortezomib 1.65m/m2 on days 1, 8, 15, dexamethasone 426mon days 1,8, and 15, and revlimid 2534mO daily days 1-14. Cycles will consist of 21 days. We will use this regimen to stabilize the patient's myeloma, then make a referral to a BMT center of his choosing for consideration of a bone marrow transplant once he has been noted to have a good response (VGPR or better). Zometa can be started after dental clearance is obtained. This will be continued x 2 years.   # IgG Kappa Multiple Myeloma --diagnostic criteria was met with monoclonal plasma cells in the blood and  lytic lesions on the bone --recommend proceeding with VRd chemotherapy as noted above --patient is young and healthy enough for consideration of BMT, though he is borderline with his age.  --plan for Cycle 1 Day 1 to start in approximately 1 weeks time --RTC for Cycle 1 Day 1 of treatment  #Supportive Care -- chemotherapy education to be scheduled  -- port placement to be scheduled.  --zometa therapy to be started with request for dental clearance -- zofran 8mg36mH PRN and compazine 10mg76mq6H for nausea -- acyclovir 400mg 47mID for VCZ prophylaxis -- no pain medication required at this time.   No orders of the defined types were placed in this encounter.  All questions were answered. The patient knows to call the clinic with any problems, questions or concerns.  A total of more than 40 minutes were spent on this encounter with face-to-face time and non-face-to-face time, including preparing to see the patient, ordering tests and/or medications, counseling the patient and coordination of care as outlined above.   Samuel Piscopo TLedell Peoplesepartment of Hematology/Oncology [facility]: [phone]: [phone]: [email or web address]  03/05/2021 1:07 PM

## 2021-03-05 NOTE — Progress Notes (Signed)
Spoke with patient and his wife regarding [name]. Enrolled in [place] forms signed and fax'd to [facility]  Prescription placed in oral chemo folder. Staff message sent to Leron Croak and Wynn Maudlin in our pharmacy

## 2021-03-05 NOTE — Telephone Encounter (Addendum)
Oral Oncology Pharmacist Encounter  Received new prescription for [name] (lenalidomide) for the treatment of multiple myeloma in conjunction with bortezomib and dexamethasone, planned for duration of induction pending BMT.   Prescription dose and frequency assessed for appropriateness. Appropriate for therapy initiation.   CBC w/ Diff from 02/22/21 and CMP from 02/14/21 assessed, no baseline dose adjustments required.  Current medication list in Epic reviewed, no relevant/significant DDIs with [name] identified.  Evaluated chart and no patient barriers to medication adherence noted.   Patient agreement for treatment documented in MD note on 03/05/21.  Patient's insurance requires [name] to be filled through [employer]. Prescription redirected along with patient insurance information.   Oral Oncology Clinic will continue to follow for insurance authorization, copayment issues, initial counseling and start date.  Leron Croak, PharmD, BCPS Hematology/Oncology Clinical Pharmacist [facility] [phone] 03/05/2021 3:03 PM

## 2021-03-06 MED ORDER — LENALIDOMIDE 25 MG PO CAPS: 25.0000 mg | ORAL_CAPSULE | Freq: Every day | ORAL | 0 refills | Status: DC

## 2021-03-07 ENCOUNTER — Telehealth: Payer: Self-pay

## 2021-03-07 NOTE — Telephone Encounter (Signed)
Oral Oncology Patient Advocate Encounter  Received notification from Christella Scheuermann that prior authorization for [name] is required.  PA submitted by phone [id number] [place] [id number] Status is pending  Oral Oncology Clinic will continue to follow.  [place] Patient [place] Phone [phone] Fax [phone] 03/07/2021 1:56 PM

## 2021-03-09 NOTE — Telephone Encounter (Signed)
Oral Oncology Patient Advocate Encounter  Prior Authorization for [name] has been approved.    PA# [id number] Effective dates: 03/09/21 through 03/09/22  Patient must use [facility] will continue to follow.   [place] Patient [place] Phone [phone] Fax [phone] 03/09/2021 4:13 PM

## 2021-03-12 ENCOUNTER — Inpatient Hospital Stay: Payer: Managed Care, Other (non HMO)

## 2021-03-12 ENCOUNTER — Other Ambulatory Visit: Payer: Self-pay

## 2021-03-12 NOTE — Telephone Encounter (Signed)
Oral Chemotherapy Pharmacist Encounter   Spoke with patient today to follow up regarding patient's oral chemotherapy medication: [name] (lenalidomide)  Patient's insurance requires that [name] be filled through [employer]. Prescription sent to [facility] on 03/06/21. Provided patient with phone number to [facility] to call and set up shipment (phone number [phone]) to have medication in hand to start on 03/16/21. Patient verbalized understanding.   Oral Chemotherapy Clinic phone number provided to patient.  Leron Croak, PharmD, BCPS Hematology/Oncology Clinical Pharmacist [facility] [phone] 03/12/2021 11:41 AM

## 2021-03-14 NOTE — Telephone Encounter (Signed)
Oral Chemotherapy Pharmacist Encounter  I spoke with patient for overview of: [name] for the treatment of multiple myeloma in conjunction with [name] and dexamethasone, planned for duration of induction pending BMT.   Counseled patient on administration, dosing, side effects, monitoring, drug-food interactions, safe handling, storage, and disposal.  Patient will take [name] 67m capsules, 1 capsule by mouth once daily, without regard to food, with a full glass of water.  [name] will be given 14 days on, 7 days off, repeat every 21 days.  Patient will take dexamethasone 455mtablets, 10 tablets (4011mby mouth once weekly with breakfast.  [name] start date: 03/16/21  Adverse effects of [name] include but are not limited to: nausea, constipation, diarrhea, abdominal pain, rash, fatigue, drug fever, and decreased blood counts.    Reviewed with patient importance of keeping a medication schedule and plan for any missed doses. No barriers to medication adherence identified.  Medication reconciliation performed and medication/allergy list updated.  Patient has picked up acyclovir and dexamethasone prescriptions and will wait until cycle 1 day 1 prior to starting dexamethasone. Patient counseled on importance of daily aspirin 52m23mr VTE prophylaxis.  Insurance authorization for [name] has been obtained.  [name] prescription is being dispensed from [facility] is a limited distribution medication. This will be delivered to the patient's home on 03/14/21.  All questions answered.  Mr. ToscJolliffced understanding and appreciation.   Medication education handout placed in mail for patient. Patient knows to call the office with questions or concerns. Oral Chemotherapy Clinic phone number provided to patient.   RebeLeron CroakarmD, BCPS Hematology/Oncology Clinical Pharmacist [phone]/2022 10:42  AM

## 2021-03-15 ENCOUNTER — Encounter: Payer: Self-pay | Admitting: Hematology and Oncology

## 2021-03-15 NOTE — Progress Notes (Signed)
Called pt to introduce myself as his Arboriculturist.  Unfortunately there aren't any foundations offering copay assistance for his Dx and the type of ins he has.  I offered the [employer], went over what it covers and gave him the income requirement.  Pt declined wanting to leave it for someone in greater need.  I requested the registration staff give him my card for any questions or concerns he may have in the future.

## 2021-03-16 ENCOUNTER — Other Ambulatory Visit: Payer: Self-pay | Admitting: Hematology and Oncology

## 2021-03-16 ENCOUNTER — Inpatient Hospital Stay: Payer: Managed Care, Other (non HMO)

## 2021-03-16 ENCOUNTER — Other Ambulatory Visit: Payer: Self-pay

## 2021-03-16 ENCOUNTER — Encounter: Payer: Self-pay | Admitting: Hematology and Oncology

## 2021-03-16 ENCOUNTER — Inpatient Hospital Stay (HOSPITAL_BASED_OUTPATIENT_CLINIC_OR_DEPARTMENT_OTHER): Payer: Managed Care, Other (non HMO) | Admitting: Hematology and Oncology

## 2021-03-16 VITALS — BP 150/84 | HR 64 | Temp 96.7°F | Resp 19 | Wt 216.4 lb

## 2021-03-16 DIAGNOSIS — C9000 Multiple myeloma not having achieved remission: Secondary | ICD-10-CM

## 2021-03-16 DIAGNOSIS — M899 Disorder of bone, unspecified: Secondary | ICD-10-CM | POA: Diagnosis not present

## 2021-03-16 DIAGNOSIS — Z5112 Encounter for antineoplastic immunotherapy: Secondary | ICD-10-CM | POA: Diagnosis not present

## 2021-03-16 LAB — CMP (CANCER CENTER ONLY)
ALT: 19 U/L (ref 0–44)
AST: 31 U/L (ref 15–41)
Albumin: 4 g/dL (ref 3.5–5.0)
Alkaline Phosphatase: 78 U/L (ref 38–126)
Anion gap: 9 (ref 5–15)
BUN: 13 mg/dL (ref 8–23)
CO2: 23 mmol/L (ref 22–32)
Calcium: 9.1 mg/dL (ref 8.9–10.3)
Chloride: 95 mmol/L — ABNORMAL LOW (ref 98–111)
Creatinine: 0.96 mg/dL (ref 0.61–1.24)
GFR, Estimated: >60 mL/min (ref >60)
Glucose, Bld: 100 mg/dL — ABNORMAL HIGH (ref 70–99)
Potassium: 4.3 mmol/L (ref 3.5–5.1)
Sodium: 127 mmol/L — ABNORMAL LOW (ref 135–145)
Total Bilirubin: 0.4 mg/dL (ref 0.3–1.2)
Total Protein: 8.1 g/dL (ref 6.5–8.1)

## 2021-03-16 LAB — CBC WITH DIFFERENTIAL (CANCER CENTER ONLY)
Abs Immature Granulocytes: 0.01 10*3/uL (ref 0.00–0.07)
Basophils Absolute: 0 10*3/uL (ref 0.0–0.1)
Basophils Relative: 1 %
Eosinophils Absolute: 0 10*3/uL (ref 0.0–0.5)
Eosinophils Relative: 0 %
HCT: 36.9 % — ABNORMAL LOW (ref 39.0–52.0)
Hemoglobin: 13 g/dL (ref 13.0–17.0)
Immature Granulocytes: 0 %
Lymphocytes Relative: 15 %
Lymphs Abs: 0.7 10*3/uL (ref 0.7–4.0)
MCH: 32.2 pg (ref 26.0–34.0)
MCHC: 35.2 g/dL (ref 30.0–36.0)
MCV: 91.3 fL (ref 80.0–100.0)
Monocytes Absolute: 0.1 10*3/uL (ref 0.1–1.0)
Monocytes Relative: 3 %
Neutro Abs: 3.6 10*3/uL (ref 1.7–7.7)
Neutrophils Relative %: 81 %
Platelet Count: 217 10*3/uL (ref 150–400)
RBC: 4.04 MIL/uL — ABNORMAL LOW (ref 4.22–5.81)
RDW: 13 % (ref 11.5–15.5)
WBC Count: 4.4 10*3/uL (ref 4.0–10.5)
nRBC: 0 % (ref 0.0–0.2)

## 2021-03-16 LAB — LACTATE DEHYDROGENASE: LDH: 217 U/L — ABNORMAL HIGH (ref 98–192)

## 2021-03-16 MED ORDER — DEXAMETHASONE 4 MG PO TABS: 40.0000 mg | ORAL_TABLET | Freq: Once | ORAL | Status: DC

## 2021-03-16 MED ORDER — BORTEZOMIB CHEMO SQ INJECTION 3.5 MG (2.5MG/ML)
1.3000 mg/m2 | Freq: Once | INTRAMUSCULAR | Status: AC
  Administered 2021-03-16: 2.75 mg via SUBCUTANEOUS
  Filled 2021-03-16: qty 1.1

## 2021-03-16 NOTE — Patient Instructions (Signed)
[facility] ONCOLOG[facility]  Discharge Instructions: Thank you for choosing [facility] to provide your oncology and hematology care.   If you have a lab appointment with the [facility], please go directly to the [facility] and check in at the registration area.   Wear comfortable clothing and clothing appropriate for easy access to any Portacath or PICC line.   We strive to give you quality time with your provider. You may need to reschedule your appointment if you arrive late (15 or more minutes).  Arriving late affects you and other patients whose appointments are after yours.  Also, if you miss three or more appointments without notifying the office, you may be dismissed from the clinic at the provider's discretion.      For prescription refill requests, have your pharmacy contact our office and allow 72 hours for refills to be completed.    Today you received the following chemotherapy and/or immunotherapy agents: [name]      To help prevent nausea and vomiting after your treatment, we encourage you to take your nausea medication as directed.  BELOW ARE SYMPTOMS THAT SHOULD BE REPORTED IMMEDIATELY: . *FEVER GREATER THAN 100.4 F (38 C) OR HIGHER . *CHILLS OR SWEATING . *NAUSEA AND VOMITING THAT IS NOT CONTROLLED WITH YOUR NAUSEA MEDICATION . *UNUSUAL SHORTNESS OF BREATH . *UNUSUAL BRUISING OR BLEEDING . *URINARY PROBLEMS (pain or burning when urinating, or frequent urination) . *BOWEL PROBLEMS (unusual diarrhea, constipation, pain near the anus) . TENDERNESS IN MOUTH AND THROAT WITH OR WITHOUT PRESENCE OF ULCERS (sore throat, sores in mouth, or a toothache) . UNUSUAL RASH, SWELLING OR PAIN  . UNUSUAL VAGINAL DISCHARGE OR ITCHING   Items with * indicate a potential emergency and should be followed up as soon as possible or go to the Emergency Department if any problems should occur.  Please show the CHEMOTHERAPY ALERT CARD or IMMUNOTHERAPY ALERT  CARD at check-in to the Emergency Department and triage nurse.  Should you have questions after your visit or need to cancel or reschedule your appointment, please contact [facility]  Dept: [phone]  and follow the prompts.  Office hours are 8:00 a.m. to 4:30 p.m. Monday - Friday. Please note that voicemails left after 4:00 p.m. may not be returned until the following business day.  We are closed weekends and major holidays. You have access to a nurse at all times for urgent questions. Please call the main number to the clinic Dept: [phone] and follow the prompts.   For any non-urgent questions, you may also contact your provider using MyChart. We now offer e-Visits for anyone 59 and older to request care online for non-urgent symptoms. For details visit [email or web address].   Also download the MyChart app! Go to the app store, search "MyChart", open the app, select [facility], and log in with your MyChart username and password.  Due to Covid, a mask is required upon entering the hospital/clinic. If you do not have a mask, one will be given to you upon arrival. For doctor visits, patients may have 1 support person aged 82 or older with them. For treatment visits, patients cannot have anyone with them due to current Covid guidelines and our immunocompromised population.   Bortezomib injection What is this medicine? BORTEZOMIB (bor TEZ oh mib) targets proteins in cancer cells and stops the cancer cells from growing. It treats multiple myeloma and mantle cell lymphoma. This medicine may be used for other purposes;  ask your health care provider or pharmacist if you have questions. COMMON BRAND NAME(S): [name] What should I tell my health care provider before I take this medicine? They need to know if you have any of these conditions:  dehydration  diabetes (high blood sugar)  heart disease  liver disease  tingling of the fingers or toes or other  nerve disorder  an unusual or allergic reaction to bortezomib, mannitol, boron, other medicines, foods, dyes, or preservatives  pregnant or trying to get pregnant  breast-feeding How should I use this medicine? This medicine is injected into a vein or under the skin. It is given by a health care provider in a hospital or clinic setting. Talk to your health care provider about the use of this medicine in children. Special care may be needed. Overdosage: If you think you have taken too much of this medicine contact a poison control center or emergency room at once. NOTE: This medicine is only for you. Do not share this medicine with others. What if I miss a dose? Keep appointments for follow-up doses. It is important not to miss your dose. Call your health care provider if you are unable to keep an appointment. What may interact with this medicine? This medicine may interact with the following medications:  ketoconazole  rifampin This list may not describe all possible interactions. Give your health care provider a list of all the medicines, herbs, non-prescription drugs, or dietary supplements you use. Also tell them if you smoke, drink alcohol, or use illegal drugs. Some items may interact with your medicine. What should I watch for while using this medicine? Your condition will be monitored carefully while you are receiving this medicine. You may need blood work done while you are taking this medicine. You may get drowsy or dizzy. Do not drive, use machinery, or do anything that needs mental alertness until you know how this medicine affects you. Do not stand up or sit up quickly, especially if you are an older patient. This reduces the risk of dizzy or fainting spells This medicine may increase your risk of getting an infection. Call your health care provider for advice if you get a fever, chills, sore throat, or other symptoms of a cold or flu. Do not treat yourself. Try to avoid being  around people who are sick. Check with your health care provider if you have severe diarrhea, nausea, and vomiting, or if you sweat a lot. The loss of too much body fluid may make it dangerous for you to take this medicine. Do not become pregnant while taking this medicine or for 7 months after stopping it. Women should inform their health care provider if they wish to become pregnant or think they might be pregnant. Men should not father a child while taking this medicine and for 4 months after stopping it. There is a potential for serious harm to an unborn child. Talk to your health care provider for more information. Do not breast-feed an infant while taking this medicine or for 2 months after stopping it. This medicine may make it more difficult to get pregnant or father a child. Talk to your health care provider if you are concerned about your fertility. What side effects may I notice from receiving this medicine? Side effects that you should report to your doctor or health care professional as soon as possible:  allergic reactions (skin rash; itching or hives; swelling of the face, lips, or tongue)  bleeding (bloody or black, tarry  stools; red or dark brown urine; spitting up blood or brown material that looks like coffee grounds; red spots on the skin; unusual bruising or bleeding from the eye, gums, or nose)  blurred vision or changes in vision  confusion  constipation  headache  heart failure (trouble breathing; fast, irregular heartbeat; sudden weight gain; swelling of the ankles, feet, hands)  infection (fever, chills, cough, sore throat, pain or trouble passing urine)  lack or loss of appetite  liver injury (dark yellow or brown urine; general ill feeling or flu-like symptoms; loss of appetite, right upper belly pain; yellowing of the eyes or skin)  low blood pressure (dizziness; feeling faint or lightheaded, falls; unusually weak or tired)  muscle cramps  pain, redness, or  irritation at site where injected  pain, tingling, numbness in the hands or feet  seizures  trouble breathing  unusual bruising or bleeding Side effects that usually do not require medical attention (report to your doctor or health care professional if they continue or are bothersome):  diarrhea  nausea  stomach pain  trouble sleeping  vomiting This list may not describe all possible side effects. Call your doctor for medical advice about side effects. You may report side effects to FDA at 1-800-FDA-1088. Where should I keep my medicine? This medicine is given in a hospital or clinic. It will not be stored at home. NOTE: This sheet is a summary. It may not cover all possible information. If you have questions about this medicine, talk to your doctor, pharmacist, or health care provider.  2021 Elsevier/Gold Standard (2020-09-28 13:22:53)

## 2021-03-16 NOTE — Progress Notes (Signed)
[facility] [phone]   Fax:([phone]) [phone]  PROGRESS NOTE  Patient Care Team: Antony Contras, MD as PCP - General (Family Medicine)  Hematological/Oncological History # IgG Kappa Multiple Myeloma 02/02/2021:  Presented to [facility] ED due to right sided flank tenderness with bruising. CT abdomen/pelvis: Multiple small lytic lesions in the thoracolumbar spine and bilateral pelvis -SPEP: IgG 2,082 (H), M Protein 1.8 (H). IFE shows IgG monoclonal protein with kappa light chain specificity.  -LDH 169, CBC normal, CMP normal except for sodium 131 (L), Chloride 96 (L).  02/14/2021: Establish care with Dede Query PA-C 02/22/2021: bone marrow biopsy confirms the diagnosis of Multiple Myeloma with a monoclonal plasma cell population.  03/16/2021: Cycle 1 Day 1 of VRd chemotherapy   Interval History:  Samuel [name] 65 y.o. male with medical history significant for IgG Kappa multiple myeloma who presents for a follow up visit. The patient's last visit was on 03/05/2021 at which time he established care. In the interim since the last visit he has acquired his revlimid and other supportive medications.   On exam today Samuel [name] is accompanied by his wife. He reports his been well in the interim since our last visit.  He has had no issues with fatigue, bleeding, bruising, or dark stools.  Denies any fevers, chills, sweats, nausea, vomiting or diarrhea.  A full 10 point ROS is listed below.  The bulk of our discussion today focused on the treatment plan and ensuring that he had all of the necessary medications prior to the start of treatment.  We were able to address all of his questions or concerns prior to the start.   MEDICAL HISTORY:  Past Medical History:  Diagnosis Date  . Asthma   . High cholesterol    under control.   Marland Kitchen Perennial allergic rhinitis   . Sciatic pain, right   . Seasonal allergic rhinitis   . Thyroid disease     SURGICAL HISTORY: Past Surgical  History:  Procedure Laterality Date  . NASAL SINUS SURGERY    . NECK SURGERY  1996    SOCIAL HISTORY: Social History   Socioeconomic History  . Marital status: Married    Spouse name: Not on file  . Number of children: Not on file  . Years of education: Not on file  . Highest education level: Not on file  Occupational History  . Not on file  Tobacco Use  . Smoking status: Former Smoker    Packs/day: 0.10    Years: 5.00    Pack years: 0.50    Quit date: 10/21/1982    Years since quitting: 38.4  . Smokeless tobacco: Never Used  Substance and Sexual Activity  . Alcohol use: Yes    Comment: 1 drink daily  . Drug use: No  . Sexual activity: Not on file  Other Topics Concern  . Not on file  Social History Narrative  . Not on file   Social Determinants of Health   Financial Resource Strain: Not on file  Food Insecurity: Not on file  Transportation Needs: Not on file  Physical Activity: Not on file  Stress: Not on file  Social Connections: Not on file  Intimate Partner Violence: Not on file    FAMILY HISTORY: Family History  Problem Relation Age of Onset  . Allergies Father   . Allergies Mother   . Hypertension Other   . Heart disease Other   . Breast cancer Paternal Grandmother     ALLERGIES:  is allergic to clarithromycin and penicillins.  MEDICATIONS:  Current Outpatient Medications  Medication Sig Dispense Refill  . acyclovir (ZOVIRAX) 400 MG tablet Take 1 tablet (400 mg total) by mouth 2 (two) times daily. 60 tablet 3  . allopurinol (ZYLOPRIM) 100 MG tablet Take 100 mg by mouth daily.    Marland Kitchen ALPRAZolam (XANAX) 1 MG tablet Take 1 mg by mouth 2 (two) times daily as needed for anxiety.    . ASSESS FULL RANGE PEAK METER DEVI as directed.    . cholecalciferol (VITAMIN D) 1000 UNITS tablet Take 3,000 Units by mouth daily.    Marland Kitchen dexamethasone (DECADRON) 4 MG tablet Take 10 tablets (40 mg total) by mouth once a week. 40 tablet 3  . EPINEPHrine (EPI-PEN) 0.3 mg/0.3  mL DEVI Inject 0.3 mg into the muscle once as needed.    . ezetimibe-simvastatin (VYTORIN) 10-40 MG per tablet Take 1 tablet by mouth at bedtime.    Marland Kitchen lenalidomide ([name]) 25 MG capsule Take 1 capsule (25 mg total) by mouth daily. Take for 14 days on, 7 days off, repeat every 21 days. [employer] Auth # [id number]    Date Obtained 03/05/21 14 capsule 0  . levothyroxine (SYNTHROID) 150 MCG tablet Take 150 mcg by mouth every other day. Alternates with 175 mcg    . montelukast (SINGULAIR) 10 MG tablet Take 10 mg by mouth at bedtime.    . Multiple Vitamin (MULTIVITAMIN) tablet Take 1 tablet by mouth daily.    Marland Kitchen omeprazole (PRILOSEC) 10 MG capsule Take 10 mg by mouth daily.    . ondansetron (ZOFRAN) 8 MG tablet Take 1 tablet (8 mg total) by mouth every 8 (eight) hours as needed for nausea or vomiting. 30 tablet 0  . prochlorperazine (COMPAZINE) 10 MG tablet Take 1 tablet (10 mg total) by mouth every 6 (six) hours as needed for nausea or vomiting. 30 tablet 0  . sildenafil (REVATIO) 20 MG tablet 2-5 tablets one hour prior to sexual intimacy    . tamsulosin (FLOMAX) 0.4 MG CAPS capsule 1 capsule    . triamcinolone cream (KENALOG) 0.1 % Apply topically.    . Zinc 50 MG TABS Take by mouth.     No current facility-administered medications for this visit.    REVIEW OF SYSTEMS:   Constitutional: ( - ) fevers, ( - )  chills , ( - ) night sweats Eyes: ( - ) blurriness of vision, ( - ) double vision, ( - ) watery eyes Ears, nose, mouth, throat, and face: ( - ) mucositis, ( - ) sore throat Respiratory: ( - ) cough, ( - ) dyspnea, ( - ) wheezes Cardiovascular: ( - ) palpitation, ( - ) chest discomfort, ( - ) lower extremity swelling Gastrointestinal:  ( - ) nausea, ( - ) heartburn, ( - ) change in bowel habits Skin: ( - ) abnormal skin rashes Lymphatics: ( - ) new lymphadenopathy, ( - ) easy bruising Neurological: ( - ) numbness, ( - ) tingling, ( - ) new weaknesses Behavioral/Psych: ( - ) mood change, ( - )  new changes  All other systems were reviewed with the patient and are negative.  PHYSICAL EXAMINATION: ECOG PERFORMANCE STATUS: 0 - Asymptomatic  Vitals:   03/16/21 1044  BP: (!) 150/84  Pulse: 64  Resp: 19  Temp: (!) 96.7 F (35.9 C)  SpO2: 100%   Filed Weights   03/16/21 1044  Weight: 216 lb 6.4 oz (98.2 kg)    GENERAL: well appearing  middle aged Caucasian male alert, no distress and comfortable SKIN: skin color, texture, turgor are normal, no rashes or significant lesions EYES: conjunctiva are pink and non-injected, sclera clear LUNGS: clear to auscultation and percussion with normal breathing effort HEART: regular rate & rhythm and no murmurs and no lower extremity edema Musculoskeletal: no cyanosis of digits and no clubbing  PSYCH: alert & oriented x 3, fluent speech NEURO: no focal motor/sensory deficits  LABORATORY DATA:  I have reviewed the data as listed CBC Latest Ref Rng & Units 03/16/2021 02/22/2021 02/14/2021  WBC 4.0 - 10.5 K/uL 4.4 5.7 4.7  Hemoglobin 13.0 - 17.0 g/dL 13.0 13.2 15.0  Hematocrit 39.0 - 52.0 % 36.9(L) 38.3(L) 43.3  Platelets 150 - 400 K/uL 217 228 237    CMP Latest Ref Rng & Units 03/16/2021 02/14/2021 02/02/2021  Glucose 70 - 99 mg/dL 100(H) 94 86  BUN 8 - 23 mg/dL '13 14 20  ' Creatinine 0.61 - 1.24 mg/dL 0.96 0.98 1.07  Sodium 135 - 145 mmol/L 127(L) 131(L) 131(L)  Potassium 3.5 - 5.1 mmol/L 4.3 4.8 4.4  Chloride 98 - 111 mmol/L 95(L) 96(L) 96(L)  CO2 22 - 32 mmol/L '23 26 29  ' Calcium 8.9 - 10.3 mg/dL 9.1 9.4 9.4  Total Protein 6.5 - 8.1 g/dL 8.1 8.4(H) 7.8  Total Bilirubin 0.3 - 1.2 mg/dL 0.4 0.4 0.4  Alkaline Phos 38 - 126 U/L 78 85 67  AST 15 - 41 U/L '31 29 25  ' ALT 0 - 44 U/L '19 23 21    ' Lab Results  Component Value Date   MPROTEIN 1.8 (H) 02/02/2021   Lab Results  Component Value Date   KPAFRELGTCHN 45.1 (H) 02/14/2021   LAMBDASER 13.2 02/14/2021   KAPLAMBRATIO 1.36 (L) 02/20/2021   KAPLAMBRATIO 3.42 (H) 02/14/2021     RADIOGRAPHIC STUDIES: I have personally reviewed the radiological images as listed and agreed with the findings in the report: lytic lesions in the hip bones bilaterally.  DG Bone Survey Met  Result Date: 02/24/2021 CLINICAL DATA:  Bone survey to evaluate for lytic lesions. EXAM: METASTATIC BONE SURVEY COMPARISON:  CT abdomen dated 02/02/2021. FINDINGS: Skull: Questionable small lytic lesion at the vertex of the calvarium. Cervical Spine: Normal bony mineralization. No focal lytic or blastic osseous lesion. Degenerative spondylosis throughout the cervical spine, with most prominent disc space narrowing and osseous spurring at the C3-4 and C6-7 levels, with associated chronic osseous fusion at the C5-6 level, and with associated reversal of the normal cervical spine lordosis. Thoracic Spine: Normal bony mineralization. No focal lytic or blastic osseous lesion. Mild degenerative spondylosis. Vertebral body wedging of multiple levels, of uncertain chronicity. Chest: Normal bony mineralization. No focal lytic or blastic osseous lesion. Coarse lung markings bilaterally suggesting some degree of underlying chronic interstitial lung disease/fibrosis and/or chronic bronchitic change. Lumbar Spine: Normal bony mineralization. No focal lytic or blastic osseous lesion. Mild dextroscoliosis. Degenerative spondylosis throughout, mild to moderate in degree. Chronic bilateral pars interarticularis defects at L5-S1 with resultant grade 1/2 spondylolisthesis. Pelvis: Lytic lesion within the LEFT iliac bone, adjacent to the LEFT SI joint, as demonstrated on earlier CT abdomen of 02/02/2021. Smaller lytic lesion within the RIGHT iliac bone, also demonstrated on earlier CT. No additional lytic-appearing lesions are identified within the osseous pelvis. Right Upper Extremity: Normal bony mineralization. No focal lytic or blastic osseous lesion. Left Upper Extremity: Normal bony mineralization. No focal lytic or blastic osseous  lesion. Right Lower Extremity: Normal bony mineralization. No focal lytic or blastic  osseous lesion. Left Lower Extremity: Normal bony mineralization. No focal lytic or blastic osseous lesion. IMPRESSION: 1. Lytic lesions redemonstrated within each iliac bone, as initially identified on recent CT abdomen and pelvis dated 02/02/2021. 2. Questionable additional lytic lesion at the vertex of the skull. Consider head CT to confirm. 3. No additional lytic or blastic bone lesions identified. The small lytic lesions seen within the thoracolumbar spine on recent CT are apparently too small to be visualized by plain film. 4. Degenerative spondylosis of the cervical, thoracic and lumbar spine, as detailed above. Multilevel vertebral body compression deformities of the spine, of uncertain chronicity, presumably related to the degenerative changes detailed above. 5. Coarse interstitial markings throughout both lungs, suggesting a chronic interstitial lung disease and/or chronic bronchitic change. 6. Chronic bilateral pars interarticularis defects at L5-S1 with resultant grade 1/2 spondylolisthesis. Electronically Signed   By: Franki Cabot M.D.   On: 02/24/2021 09:57    ASSESSMENT & PLAN Samuel [name] 65 y.o. male with medical history significant for IgG Kappa multiple myeloma who presents for a follow up visit.   After review of the labs, review of the records, and discussion with the patient the findings are most consistent with an IgG kappa multiple myeloma.  The patient has 2 lytic lesions within the pelvic bones as well as 10% plasma cells within the bone marrow biopsy.  This combined with his serological findings confirm the diagnosis of multiple myeloma.  The treatment of choice for this patient's multiple myeloma is VRd. This will consist of bortezomib 1.54m/m2 on days 1, 8, 15, dexamethasone 468mon days 1,8, and 15, and revlimid 2572mO daily days 1-14. Cycles will consist of 21 days. We will use this  regimen to stabilize the patient's myeloma, then make a referral to a BMT center of his choosing for consideration of a bone marrow transplant once he has been noted to have a good response (VGPR or better). Zometa can be started after dental clearance is obtained. This will be continued x 2 years.   R-IPSS score: 2. PFS of 42 months  # IgG Kappa Multiple Myeloma --diagnostic criteria was met with monoclonal plasma cells in the blood and lytic lesions on the bone --recommend proceeding with VRd chemotherapy as noted above --patient is young and healthy enough for consideration of BMT, though he is borderline with his age. Will consider referral pending response to treatment.  -- Cycle 1 Day 1 on 03/16/2021 --RTC for Cycle 1 Day 15 of treatment  #Supportive Care -- chemotherapy education to be scheduled  -- port placement to be scheduled.  --zometa therapy to be started next week, dental clearance obtained.  --ASA 52m44m daily for thromboprophylaxis on revlimid -- zofran 8mg 2m PRN and compazine 10mg 68m6H for nausea -- acyclovir 400mg P54mD for VCZ prophylaxis -- no pain medication required at this time.   No orders of the defined types were placed in this encounter.  All questions were answered. The patient knows to call the clinic with any problems, questions or concerns.  A total of more than 30 minutes were spent on this encounter with face-to-face time and non-face-to-face time, including preparing to see the patient, ordering tests and/or medications, counseling the patient and coordination of care as outlined above.   Nickholas Goldston T.Ledell Peoplespartment of Hematology/Oncology [facility] [phone] [email or web address]  03/16/2021 11:09 AM

## 2021-03-17 LAB — BETA 2 MICROGLOBULIN, SERUM: Beta-2 Microglobulin: 1.5 mg/L (ref 0.6–2.4)

## 2021-03-20 ENCOUNTER — Telehealth: Payer: Self-pay | Admitting: *Deleted

## 2021-03-20 LAB — KAPPA/LAMBDA LIGHT CHAINS
Kappa free light chain: 41.8 mg/L — ABNORMAL HIGH (ref 3.3–19.4)
Kappa, lambda light chain ratio: 3.05 — ABNORMAL HIGH (ref 0.26–1.65)
Lambda free light chains: 13.7 mg/L (ref 5.7–26.3)

## 2021-03-20 NOTE — Telephone Encounter (Signed)
Called pt to see how he did with his recent treatment.  He repots doing well except for some constipation.  He reports some cramping yesterday but has gone away today.  He took Miralax yesterday.  He reports Nancy Fetter which was a small amt & yesterday barely.  Informed that he may need to add a stool softener b/c Miralax takes a little while to work.  Discussed adding extra fluids & fiber to see it that helps.  Discussed Magnesium Citrate if needed.  Instructed to cont Miralax for now & to call back if needed.  He expressed understanding & appreciation.

## 2021-03-20 NOTE — Telephone Encounter (Signed)
-----   Message from Scot Dock, RN sent at 03/16/2021  2:31 PM EDT ----- Regarding: Dr. [name] 1st time [name] follow up

## 2021-03-21 LAB — MULTIPLE MYELOMA PANEL, SERUM
Albumin SerPl Elph-Mcnc: 4.1 g/dL (ref 2.9–4.4)
Albumin/Glob SerPl: 1.3 (ref 0.7–1.7)
Alpha 1: 0.2 g/dL (ref 0.0–0.4)
Alpha2 Glob SerPl Elph-Mcnc: 0.6 g/dL (ref 0.4–1.0)
B-Globulin SerPl Elph-Mcnc: 0.8 g/dL (ref 0.7–1.3)
Gamma Glob SerPl Elph-Mcnc: 1.7 g/dL (ref 0.4–1.8)
Globulin, Total: 3.3 g/dL (ref 2.2–3.9)
IgA: 179 mg/dL (ref 61–437)
IgG (Immunoglobin G), Serum: 1979 mg/dL — ABNORMAL HIGH (ref 603–1613)
IgM (Immunoglobulin M), Srm: 72 mg/dL (ref 20–172)
M Protein SerPl Elph-Mcnc: 1.3 g/dL — ABNORMAL HIGH
Total Protein ELP: 7.4 g/dL (ref 6.0–8.5)

## 2021-03-23 ENCOUNTER — Inpatient Hospital Stay: Payer: Managed Care, Other (non HMO)

## 2021-03-23 ENCOUNTER — Inpatient Hospital Stay: Payer: Managed Care, Other (non HMO) | Attending: Hematology and Oncology

## 2021-03-23 ENCOUNTER — Other Ambulatory Visit: Payer: Self-pay

## 2021-03-23 VITALS — BP 159/88 | HR 68 | Temp 98.5°F | Resp 18

## 2021-03-23 DIAGNOSIS — Z5112 Encounter for antineoplastic immunotherapy: Secondary | ICD-10-CM | POA: Insufficient documentation

## 2021-03-23 DIAGNOSIS — C9000 Multiple myeloma not having achieved remission: Secondary | ICD-10-CM

## 2021-03-23 DIAGNOSIS — Z87891 Personal history of nicotine dependence: Secondary | ICD-10-CM | POA: Insufficient documentation

## 2021-03-23 DIAGNOSIS — Z79899 Other long term (current) drug therapy: Secondary | ICD-10-CM | POA: Diagnosis not present

## 2021-03-23 LAB — CBC WITH DIFFERENTIAL (CANCER CENTER ONLY)
Abs Immature Granulocytes: 0.04 10*3/uL (ref 0.00–0.07)
Basophils Absolute: 0 10*3/uL (ref 0.0–0.1)
Basophils Relative: 0 %
Eosinophils Absolute: 0 10*3/uL (ref 0.0–0.5)
Eosinophils Relative: 0 %
HCT: 37.6 % — ABNORMAL LOW (ref 39.0–52.0)
Hemoglobin: 13.1 g/dL (ref 13.0–17.0)
Immature Granulocytes: 1 %
Lymphocytes Relative: 5 %
Lymphs Abs: 0.3 10*3/uL — ABNORMAL LOW (ref 0.7–4.0)
MCH: 31.8 pg (ref 26.0–34.0)
MCHC: 34.8 g/dL (ref 30.0–36.0)
MCV: 91.3 fL (ref 80.0–100.0)
Monocytes Absolute: 0 10*3/uL — ABNORMAL LOW (ref 0.1–1.0)
Monocytes Relative: 1 %
Neutro Abs: 6.1 10*3/uL (ref 1.7–7.7)
Neutrophils Relative %: 93 %
Platelet Count: 201 10*3/uL (ref 150–400)
RBC: 4.12 MIL/uL — ABNORMAL LOW (ref 4.22–5.81)
RDW: 12.9 % (ref 11.5–15.5)
WBC Count: 6.5 10*3/uL (ref 4.0–10.5)
nRBC: 0 % (ref 0.0–0.2)

## 2021-03-23 LAB — CMP (CANCER CENTER ONLY)
ALT: 23 U/L (ref 0–44)
AST: 29 U/L (ref 15–41)
Albumin: 3.8 g/dL (ref 3.5–5.0)
Alkaline Phosphatase: 68 U/L (ref 38–126)
Anion gap: 9 (ref 5–15)
BUN: 14 mg/dL (ref 8–23)
CO2: 22 mmol/L (ref 22–32)
Calcium: 9 mg/dL (ref 8.9–10.3)
Chloride: 94 mmol/L — ABNORMAL LOW (ref 98–111)
Creatinine: 1.07 mg/dL (ref 0.61–1.24)
GFR, Estimated: >60 mL/min (ref >60)
Glucose, Bld: 120 mg/dL — ABNORMAL HIGH (ref 70–99)
Potassium: 4.7 mmol/L (ref 3.5–5.1)
Sodium: 125 mmol/L — ABNORMAL LOW (ref 135–145)
Total Bilirubin: 0.3 mg/dL (ref 0.3–1.2)
Total Protein: 7.9 g/dL (ref 6.5–8.1)

## 2021-03-23 LAB — LACTATE DEHYDROGENASE: LDH: 178 U/L (ref 98–192)

## 2021-03-23 MED ORDER — BORTEZOMIB CHEMO SQ INJECTION 3.5 MG (2.5MG/ML)
1.3000 mg/m2 | Freq: Once | INTRAMUSCULAR | Status: AC
  Administered 2021-03-23: 2.75 mg via SUBCUTANEOUS
  Filled 2021-03-23: qty 1.1

## 2021-03-23 NOTE — Patient Instructions (Signed)
[facility]  Discharge Instructions: Thank you for choosing [facility] to provide your oncology and hematology care.   If you have a lab appointment with the [facility], please go directly to the [facility] and check in at the registration area.   Wear comfortable clothing and clothing appropriate for easy access to any Portacath or PICC line.   We strive to give you quality time with your provider. You may need to reschedule your appointment if you arrive late (15 or more minutes).  Arriving late affects you and other patients whose appointments are after yours.  Also, if you miss three or more appointments without notifying the office, you may be dismissed from the clinic at the provider's discretion.      For prescription refill requests, have your pharmacy contact our office and allow 72 hours for refills to be completed.    Today you received the following chemotherapy and/or immunotherapy agents velcade   To help prevent nausea and vomiting after your treatment, we encourage you to take your nausea medication as directed.  BELOW ARE SYMPTOMS THAT SHOULD BE REPORTED IMMEDIATELY: . *FEVER GREATER THAN 100.4 F (38 C) OR HIGHER . *CHILLS OR SWEATING . *NAUSEA AND VOMITING THAT IS NOT CONTROLLED WITH YOUR NAUSEA MEDICATION . *UNUSUAL SHORTNESS OF BREATH . *UNUSUAL BRUISING OR BLEEDING . *URINARY PROBLEMS (pain or burning when urinating, or frequent urination) . *BOWEL PROBLEMS (unusual diarrhea, constipation, pain near the anus) . TENDERNESS IN MOUTH AND THROAT WITH OR WITHOUT PRESENCE OF ULCERS (sore throat, sores in mouth, or a toothache) . UNUSUAL RASH, SWELLING OR PAIN  . UNUSUAL VAGINAL DISCHARGE OR ITCHING   Items with * indicate a potential emergency and should be followed up as soon as possible or go to the Emergency Department if any problems should occur.  Please show the CHEMOTHERAPY ALERT CARD or IMMUNOTHERAPY ALERT CARD  at check-in to the Emergency Department and triage nurse.  Should you have questions after your visit or need to cancel or reschedule your appointment, please contact [facility]  Dept: [phone]  and follow the prompts.  Office hours are 8:00 a.m. to 4:30 p.m. Monday - Friday. Please note that voicemails left after 4:00 p.m. may not be returned until the following business day.  We are closed weekends and major holidays. You have access to a nurse at all times for urgent questions. Please call the main number to the clinic Dept: [phone] and follow the prompts.   For any non-urgent questions, you may also contact your provider using MyChart. We now offer e-Visits for anyone 72 and older to request care online for non-urgent symptoms. For details visit [email or web address].   Also download the MyChart app! Go to the app store, search "MyChart", open the app, select [facility], and log in with your MyChart username and password.  Due to Covid, a mask is required upon entering the hospital/clinic. If you do not have a mask, one will be given to you upon arrival. For doctor visits, patients may have 1 support person aged 34 or older with them. For treatment visits, patients cannot have anyone with them due to current Covid guidelines and our immunocompromised population.

## 2021-03-28 ENCOUNTER — Other Ambulatory Visit: Payer: Self-pay | Admitting: Hematology and Oncology

## 2021-03-28 DIAGNOSIS — C9000 Multiple myeloma not having achieved remission: Secondary | ICD-10-CM

## 2021-03-30 ENCOUNTER — Inpatient Hospital Stay: Payer: Managed Care, Other (non HMO)

## 2021-03-30 ENCOUNTER — Inpatient Hospital Stay (HOSPITAL_BASED_OUTPATIENT_CLINIC_OR_DEPARTMENT_OTHER): Payer: Managed Care, Other (non HMO) | Admitting: Hematology and Oncology

## 2021-03-30 ENCOUNTER — Other Ambulatory Visit: Payer: Self-pay

## 2021-03-30 VITALS — BP 127/72 | HR 63 | Temp 97.6°F | Resp 18 | Ht 69.5 in | Wt 214.4 lb

## 2021-03-30 DIAGNOSIS — Z5112 Encounter for antineoplastic immunotherapy: Secondary | ICD-10-CM | POA: Diagnosis not present

## 2021-03-30 DIAGNOSIS — M899 Disorder of bone, unspecified: Secondary | ICD-10-CM

## 2021-03-30 DIAGNOSIS — C9000 Multiple myeloma not having achieved remission: Secondary | ICD-10-CM

## 2021-03-30 LAB — CBC WITH DIFFERENTIAL (CANCER CENTER ONLY)
Abs Immature Granulocytes: 0.02 10*3/uL (ref 0.00–0.07)
Basophils Absolute: 0 10*3/uL (ref 0.0–0.1)
Basophils Relative: 0 %
Eosinophils Absolute: 0 10*3/uL (ref 0.0–0.5)
Eosinophils Relative: 1 %
HCT: 36.9 % — ABNORMAL LOW (ref 39.0–52.0)
Hemoglobin: 12.8 g/dL — ABNORMAL LOW (ref 13.0–17.0)
Immature Granulocytes: 0 %
Lymphocytes Relative: 15 %
Lymphs Abs: 1 10*3/uL (ref 0.7–4.0)
MCH: 32.2 pg (ref 26.0–34.0)
MCHC: 34.7 g/dL (ref 30.0–36.0)
MCV: 92.7 fL (ref 80.0–100.0)
Monocytes Absolute: 0.5 10*3/uL (ref 0.1–1.0)
Monocytes Relative: 7 %
Neutro Abs: 5.2 10*3/uL (ref 1.7–7.7)
Neutrophils Relative %: 77 %
Platelet Count: 195 10*3/uL (ref 150–400)
RBC: 3.98 MIL/uL — ABNORMAL LOW (ref 4.22–5.81)
RDW: 13.2 % (ref 11.5–15.5)
WBC Count: 6.7 10*3/uL (ref 4.0–10.5)
nRBC: 0 % (ref 0.0–0.2)

## 2021-03-30 LAB — CMP (CANCER CENTER ONLY)
ALT: 32 U/L (ref 0–44)
AST: 27 U/L (ref 15–41)
Albumin: 3.4 g/dL — ABNORMAL LOW (ref 3.5–5.0)
Alkaline Phosphatase: 63 U/L (ref 38–126)
Anion gap: 9 (ref 5–15)
BUN: 16 mg/dL (ref 8–23)
CO2: 24 mmol/L (ref 22–32)
Calcium: 8.6 mg/dL — ABNORMAL LOW (ref 8.9–10.3)
Chloride: 98 mmol/L (ref 98–111)
Creatinine: 1.04 mg/dL (ref 0.61–1.24)
GFR, Estimated: >60 mL/min (ref >60)
Glucose, Bld: 113 mg/dL — ABNORMAL HIGH (ref 70–99)
Potassium: 4.2 mmol/L (ref 3.5–5.1)
Sodium: 131 mmol/L — ABNORMAL LOW (ref 135–145)
Total Bilirubin: 0.3 mg/dL (ref 0.3–1.2)
Total Protein: 7 g/dL (ref 6.5–8.1)

## 2021-03-30 LAB — LACTATE DEHYDROGENASE: LDH: 181 U/L (ref 98–192)

## 2021-03-30 MED ORDER — BORTEZOMIB CHEMO SQ INJECTION 3.5 MG (2.5MG/ML)
1.3000 mg/m2 | Freq: Once | INTRAMUSCULAR | Status: AC
  Administered 2021-03-30: 2.75 mg via SUBCUTANEOUS
  Filled 2021-03-30: qty 1.1

## 2021-03-30 NOTE — Patient Instructions (Signed)
[facility]  Discharge Instructions: Thank you for choosing [facility] to provide your oncology and hematology care.   If you have a lab appointment with the Cancer Center, please go directly to the Cancer Center and check in at the registration area.   Wear comfortable clothing and clothing appropriate for easy access to any Portacath or PICC line.   We strive to give you quality time with your provider. You may need to reschedule your appointment if you arrive late (15 or more minutes).  Arriving late affects you and other patients whose appointments are after yours.  Also, if you miss three or more appointments without notifying the office, you may be dismissed from the clinic at the provider's discretion.      For prescription refill requests, have your pharmacy contact our office and allow 72 hours for refills to be completed.    Today you received the following chemotherapy and/or immunotherapy agents [name]       To help prevent nausea and vomiting after your treatment, we encourage you to take your nausea medication as directed.  BELOW ARE SYMPTOMS THAT SHOULD BE REPORTED IMMEDIATELY: *FEVER GREATER THAN 100.4 F (38 C) OR HIGHER *CHILLS OR SWEATING *NAUSEA AND VOMITING THAT IS NOT CONTROLLED WITH YOUR NAUSEA MEDICATION *UNUSUAL SHORTNESS OF BREATH *UNUSUAL BRUISING OR BLEEDING *URINARY PROBLEMS (pain or burning when urinating, or frequent urination) *BOWEL PROBLEMS (unusual diarrhea, constipation, pain near the anus) TENDERNESS IN MOUTH AND THROAT WITH OR WITHOUT PRESENCE OF ULCERS (sore throat, sores in mouth, or a toothache) UNUSUAL RASH, SWELLING OR PAIN  UNUSUAL VAGINAL DISCHARGE OR ITCHING   Items with * indicate a potential emergency and should be followed up as soon as possible or go to the Emergency Department if any problems should occur.  Please show the CHEMOTHERAPY ALERT CARD or IMMUNOTHERAPY ALERT CARD at check-in to  the Emergency Department and triage nurse.  Should you have questions after your visit or need to cancel or reschedule your appointment, please contact [facility]  Dept: [phone]  and follow the prompts.  Office hours are 8:00 a.m. to 4:30 p.m. Monday - Friday. Please note that voicemails left after 4:00 p.m. may not be returned until the following business day.  We are closed weekends and major holidays. You have access to a nurse at all times for urgent questions. Please call the main number to the clinic Dept: [phone] and follow the prompts.   For any non-urgent questions, you may also contact your provider using MyChart. We now offer e-Visits for anyone 18 and older to request care online for non-urgent symptoms. For details visit [email or web address].   Also download the MyChart app! Go to the app store, search "MyChart", open the app, select [facility], and log in with your MyChart username and password.  Due to Covid, a mask is required upon entering the hospital/clinic. If you do not have a mask, one will be given to you upon arrival. For doctor visits, patients may have 1 support person aged 18 or older with them. For treatment visits, patients cannot have anyone with them due to current Covid guidelines and our immunocompromised population.   

## 2021-03-30 NOTE — Progress Notes (Signed)
[facility] [phone]   [phone]  PROGRESS NOTE  Patient Care Team: Antony Contras, MD as PCP - General (Family Medicine)  Hematological/Oncological History # IgG Kappa Multiple Myeloma 02/02/2021:  Presented to [place] ED due to right sided flank tenderness with bruising. CT abdomen/pelvis: Multiple small lytic lesions in the thoracolumbar spine and bilateral pelvis -SPEP: IgG 2,082 (H), M Protein 1.8 (H). IFE shows IgG monoclonal protein with kappa light chain specificity.  -LDH 169, CBC normal, CMP normal except for sodium 131 (L), Chloride 96 (L).   02/14/2021: Establish care with Dede Query PA-C 02/22/2021: bone marrow biopsy confirms the diagnosis of Multiple Myeloma with a monoclonal plasma cell population.  03/16/2021: Cycle 1 Day 1 of VRd chemotherapy   Interval History:  Khoi Hamberger Perdew 65 y.o. male with medical history significant for IgG Kappa multiple myeloma who presents for a follow up visit. The patient's last visit was on 03/16/2021. In the interim since the last visit he has continued on VRd chemotherapy.   On exam today Mr. Ohlinger is accompanied by his wife. He reports his been well in the interim since our last visit.  He has completed 2 doses of the VRD chemotherapy.  He reports that he has not been having any fatigue but he does have "a little tingling and numbness in his hands which went away.  He notes that he was "wired from receiving the steroids".  He has developed some more constipation but it is resolving with MiraLAX.  He also notes that his back pain tends to dissipate with the steroids.  He reports that he is exercising as he is trying to help his back pain.  He notes that he has some pinkness with no itching at the site of the [name] injection.  He did have some gas after treatment but no frank nausea or diarrhea.  He also has doing his best to increase his intake of water.  He has had no issues with fatigue, bleeding,  bruising, or dark stools.  Denies any fevers, chills, sweats, nausea, vomiting or diarrhea.  A full 10 point ROS is listed below.   MEDICAL HISTORY:  Past Medical History:  Diagnosis Date   Asthma    High cholesterol    under control.    Perennial allergic rhinitis    Sciatic pain, right    Seasonal allergic rhinitis    Thyroid disease     SURGICAL HISTORY: Past Surgical History:  Procedure Laterality Date   NASAL SINUS SURGERY     NECK SURGERY  1996    SOCIAL HISTORY: Social History   Socioeconomic History   Marital status: Married    Spouse name: Not on file   Number of children: Not on file   Years of education: Not on file   Highest education level: Not on file  Occupational History   Not on file  Tobacco Use   Smoking status: Former    Packs/day: 0.10    Years: 5.00    Pack years: 0.50    Types: Cigarettes    Quit date: 10/21/1982    Years since quitting: 38.4   Smokeless tobacco: Never  Substance and Sexual Activity   Alcohol use: Yes    Comment: 1 drink daily   Drug use: No   Sexual activity: Not on file  Other Topics Concern   Not on file  Social History Narrative   Not on file   Social Determinants of Health   Financial Resource  Strain: Not on file  Food Insecurity: Not on file  Transportation Needs: Not on file  Physical Activity: Not on file  Stress: Not on file  Social Connections: Not on file  Intimate Partner Violence: Not on file    FAMILY HISTORY: Family History  Problem Relation Age of Onset   Allergies Father    Allergies Mother    Hypertension Other    Heart disease Other    Breast cancer Paternal Grandmother     ALLERGIES:  is allergic to clarithromycin and penicillins.  MEDICATIONS:  Current Outpatient Medications  Medication Sig Dispense Refill   acyclovir (ZOVIRAX) 400 MG tablet Take 1 tablet (400 mg total) by mouth 2 (two) times daily. 60 tablet 3   allopurinol (ZYLOPRIM) 300 MG tablet Take 300 mg by mouth daily.      ALPRAZolam (XANAX) 1 MG tablet Take 1 mg by mouth 2 (two) times daily as needed for anxiety.     ASSESS FULL RANGE PEAK METER DEVI as directed.     cholecalciferol (VITAMIN D) 1000 UNITS tablet Take 3,000 Units by mouth daily.     dexamethasone (DECADRON) 4 MG tablet Take 10 tablets (40 mg total) by mouth once a week. 40 tablet 3   EPINEPHrine (EPI-PEN) 0.3 mg/0.3 mL DEVI Inject 0.3 mg into the muscle once as needed.     ezetimibe-simvastatin (VYTORIN) 10-40 MG per tablet Take 1 tablet by mouth at bedtime.     levothyroxine (SYNTHROID) 150 MCG tablet Take 150 mcg by mouth every other day. Alternates with 175 mcg     levothyroxine (SYNTHROID) 150 MCG tablet Synthroid 150 mcg tablet     montelukast (SINGULAIR) 10 MG tablet Take 10 mg by mouth at bedtime.     Multiple Vitamin (MULTIVITAMIN) tablet Take 1 tablet by mouth daily.     omeprazole (PRILOSEC) 40 MG capsule Take 1 capsule by mouth daily.     ondansetron (ZOFRAN) 8 MG tablet Take 1 tablet (8 mg total) by mouth every 8 (eight) hours as needed for nausea or vomiting. 30 tablet 0   prochlorperazine (COMPAZINE) 10 MG tablet Take 1 tablet (10 mg total) by mouth every 6 (six) hours as needed for nausea or vomiting. 30 tablet 0   [name] 25 MG capsule TAKE 1 CAPSULE DAILY FOR 14 DAYS ON, 7 DAYS OFF, REPEAT EVERY 21 DAYS 14 capsule 0   sildenafil (REVATIO) 20 MG tablet 2-5 tablets one hour prior to sexual intimacy     tamsulosin (FLOMAX) 0.4 MG CAPS capsule 1 capsule     triamcinolone cream (KENALOG) 0.1 % Apply topically.     Zinc 50 MG TABS Take by mouth.     No current facility-administered medications for this visit.    REVIEW OF SYSTEMS:   Constitutional: ( - ) fevers, ( - )  chills , ( - ) night sweats Eyes: ( - ) blurriness of vision, ( - ) double vision, ( - ) watery eyes Ears, nose, mouth, throat, and face: ( - ) mucositis, ( - ) sore throat Respiratory: ( - ) cough, ( - ) dyspnea, ( - ) wheezes Cardiovascular: ( - ) palpitation,  ( - ) chest discomfort, ( - ) lower extremity swelling Gastrointestinal:  ( - ) nausea, ( - ) heartburn, ( - ) change in bowel habits Skin: ( - ) abnormal skin rashes Lymphatics: ( - ) new lymphadenopathy, ( - ) easy bruising Neurological: ( - ) numbness, ( - ) tingling, ( - )  new weaknesses Behavioral/Psych: ( - ) mood change, ( - ) new changes  All other systems were reviewed with the patient and are negative.  PHYSICAL EXAMINATION: ECOG PERFORMANCE STATUS: 0 - Asymptomatic  Vitals:   03/30/21 0850  BP: 127/72  Pulse: 63  Resp: 18  Temp: 97.6 F (36.4 C)  SpO2: 97%   Filed Weights   03/30/21 0850  Weight: 214 lb 6.4 oz (97.3 kg)    GENERAL: well appearing middle aged Caucasian male alert, no distress and comfortable SKIN: skin color, texture, turgor are normal, no rashes or significant lesions EYES: conjunctiva are pink and non-injected, sclera clear LUNGS: clear to auscultation and percussion with normal breathing effort HEART: regular rate & rhythm and no murmurs and no lower extremity edema Musculoskeletal: no cyanosis of digits and no clubbing  PSYCH: alert & oriented x 3, fluent speech NEURO: no focal motor/sensory deficits  LABORATORY DATA:  I have reviewed the data as listed CBC Latest Ref Rng & Units 03/30/2021 03/23/2021 03/16/2021  WBC 4.0 - 10.5 K/uL 6.7 6.5 4.4  Hemoglobin 13.0 - 17.0 g/dL 12.8(L) 13.1 13.0  Hematocrit 39.0 - 52.0 % 36.9(L) 37.6(L) 36.9(L)  Platelets 150 - 400 K/uL 195 201 217    CMP Latest Ref Rng & Units 03/30/2021 03/23/2021 03/16/2021  Glucose 70 - 99 mg/dL 113(H) 120(H) 100(H)  BUN 8 - 23 mg/dL _0 Creatinine 0.61 - 1.24 mg/dL 1.04 1.07 0.96  Sodium 135 - 145 mmol/L 131(L) 125(L) 127(L)  Potassium 3.5 - 5.1 mmol/L 4.2 4.7 4.3  Chloride 98 - 111 mmol/L 98 94(L) 95(L)  CO2 22 - 32 mmol/L _1 Calcium 8.9 - 10.3 mg/dL 8.6(L) 9.0 9.1  Total Protein 6.5 - 8.1 g/dL 7.0 7.9 8.1  Total Bilirubin 0.3 - 1.2 mg/dL 0.3 0.3 0.4  Alkaline  Phos 38 - 126 U/L 63 68 78  AST 15 - 41 U/L _2 ALT 0 - 44 U/L 32 23 19    Lab Results  Component Value Date   MPROTEIN 1.3 (H) 03/16/2021   MPROTEIN 1.8 (H) 02/02/2021   Lab Results  Component Value Date   KPAFRELGTCHN 41.8 (H) 03/16/2021   KPAFRELGTCHN 45.1 (H) 02/14/2021   LAMBDASER 13.7 03/16/2021   LAMBDASER 13.2 02/14/2021   KAPLAMBRATIO 3.05 (H) 03/16/2021   KAPLAMBRATIO 1.36 (L) 02/20/2021   KAPLAMBRATIO 3.42 (H) 02/14/2021    RADIOGRAPHIC STUDIES: I have personally reviewed the radiological images as listed and agreed with the findings in the report: lytic lesions in the hip bones bilaterally.  No results found.   ASSESSMENT & PLAN Ryane Canavan Chopp 65 y.o. male with medical history significant for IgG Kappa multiple myeloma who presents for a follow up visit.   After review of the labs, review of the records, and discussion with the patient the findings are most consistent with an IgG kappa multiple myeloma.  The patient has 2 lytic lesions within the pelvic bones (more noted in spine on CT scan) as well as 10% plasma cells within the bone marrow biopsy.  This combined with his serological findings confirm the diagnosis of multiple myeloma.  The treatment of choice for this patient's multiple myeloma is VRd. This will consist of bortezomib 1.78m/m2 on days 1, 8, 15, dexamethasone 427mon days 1,8, and 15, and revlimid 2576mO daily days 1-14. Cycles will consist of 21 days. We will use this regimen to stabilize the patient's myeloma, then make a referral to a  BMT center of his choosing for consideration of a bone marrow transplant once he has been noted to have a good response (VGPR or better). Zometa can be started after dental clearance is obtained. This will be continued x 2 years.   R-IPSS score: 2. PFS of 42 months  # IgG Kappa Multiple Myeloma --diagnostic criteria was met with monoclonal plasma cells in the bone marrow and lytic lesions on the  bone --recommend proceeding with VRd chemotherapy as noted above --patient is young and healthy enough for consideration of BMT, though he is borderline with his age. Will consider referral pending response to treatment.  -- Cycle 1 Day 1 started on 03/16/2021 --RTC for Cycle 2 Day 8 of treatment  #Supportive Care -- chemotherapy education to be scheduled  -- port placement to be scheduled.  --zometa therapy to be started next week, dental clearance obtained.  --ASA 25m PO daily for thromboprophylaxis on revlimid -- zofran 89mq8H PRN and compazine 1033mO q6H for nausea -- acyclovir 400m83m BID for VCZ prophylaxis -- no pain medication required at this time.   No orders of the defined types were placed in this encounter.  All questions were answered. The patient knows to call the clinic with any problems, questions or concerns.  A total of more than 30 minutes were spent on this encounter with face-to-face time and non-face-to-face time, including preparing to see the patient, ordering tests and/or medications, counseling the patient and coordination of care as outlined above.   JohnLedell Peoples Department of Hematology/Oncology [facility]: [phone]: [phone]: [email or web address]  04/03/2021 2:35 PM

## 2021-04-03 ENCOUNTER — Encounter: Payer: Self-pay | Admitting: Hematology and Oncology

## 2021-04-05 ENCOUNTER — Telehealth: Payer: Self-pay

## 2021-04-05 NOTE — Telephone Encounter (Signed)
Call attempt to return pt's call.

## 2021-04-06 ENCOUNTER — Inpatient Hospital Stay: Payer: Managed Care, Other (non HMO)

## 2021-04-06 ENCOUNTER — Other Ambulatory Visit: Payer: Self-pay

## 2021-04-06 ENCOUNTER — Other Ambulatory Visit: Payer: Self-pay | Admitting: Hematology and Oncology

## 2021-04-06 VITALS — BP 148/79 | HR 56 | Temp 98.0°F | Resp 17 | Wt 214.4 lb

## 2021-04-06 DIAGNOSIS — C9000 Multiple myeloma not having achieved remission: Secondary | ICD-10-CM

## 2021-04-06 DIAGNOSIS — Z5112 Encounter for antineoplastic immunotherapy: Secondary | ICD-10-CM | POA: Diagnosis not present

## 2021-04-06 LAB — CBC WITH DIFFERENTIAL (CANCER CENTER ONLY)
Abs Immature Granulocytes: 0.01 10*3/uL (ref 0.00–0.07)
Basophils Absolute: 0 10*3/uL (ref 0.0–0.1)
Basophils Relative: 0 %
Eosinophils Absolute: 0 10*3/uL (ref 0.0–0.5)
Eosinophils Relative: 1 %
HCT: 37.6 % — ABNORMAL LOW (ref 39.0–52.0)
Hemoglobin: 12.8 g/dL — ABNORMAL LOW (ref 13.0–17.0)
Immature Granulocytes: 0 %
Lymphocytes Relative: 10 %
Lymphs Abs: 0.5 10*3/uL — ABNORMAL LOW (ref 0.7–4.0)
MCH: 31.8 pg (ref 26.0–34.0)
MCHC: 34 g/dL (ref 30.0–36.0)
MCV: 93.5 fL (ref 80.0–100.0)
Monocytes Absolute: 0.3 10*3/uL (ref 0.1–1.0)
Monocytes Relative: 6 %
Neutro Abs: 4.1 10*3/uL (ref 1.7–7.7)
Neutrophils Relative %: 83 %
Platelet Count: 188 10*3/uL (ref 150–400)
RBC: 4.02 MIL/uL — ABNORMAL LOW (ref 4.22–5.81)
RDW: 13.5 % (ref 11.5–15.5)
WBC Count: 4.9 10*3/uL (ref 4.0–10.5)
nRBC: 0 % (ref 0.0–0.2)

## 2021-04-06 LAB — CMP (CANCER CENTER ONLY)
ALT: 31 U/L (ref 0–44)
AST: 30 U/L (ref 15–41)
Albumin: 3.9 g/dL (ref 3.5–5.0)
Alkaline Phosphatase: 52 U/L (ref 38–126)
Anion gap: 6 (ref 5–15)
BUN: 22 mg/dL (ref 8–23)
CO2: 25 mmol/L (ref 22–32)
Calcium: 8.9 mg/dL (ref 8.9–10.3)
Chloride: 98 mmol/L (ref 98–111)
Creatinine: 1.19 mg/dL (ref 0.61–1.24)
GFR, Estimated: >60 mL/min (ref >60)
Glucose, Bld: 99 mg/dL (ref 70–99)
Potassium: 4.7 mmol/L (ref 3.5–5.1)
Sodium: 129 mmol/L — ABNORMAL LOW (ref 135–145)
Total Bilirubin: 0.6 mg/dL (ref 0.3–1.2)
Total Protein: 7.1 g/dL (ref 6.5–8.1)

## 2021-04-06 LAB — LACTATE DEHYDROGENASE: LDH: 170 U/L (ref 98–192)

## 2021-04-06 MED ORDER — BORTEZOMIB CHEMO SQ INJECTION 3.5 MG (2.5MG/ML)
1.3000 mg/m2 | Freq: Once | INTRAMUSCULAR | Status: AC
  Administered 2021-04-06: 2.75 mg via SUBCUTANEOUS
  Filled 2021-04-06: qty 1.1

## 2021-04-06 MED ORDER — ZOLEDRONIC ACID 4 MG/100ML IV SOLN
4.0000 mg | Freq: Once | INTRAVENOUS | Status: AC
  Administered 2021-04-06: 4 mg via INTRAVENOUS

## 2021-04-06 MED ORDER — ZOLEDRONIC ACID 4 MG/100ML IV SOLN
INTRAVENOUS | Status: AC
  Filled 2021-04-06: qty 100

## 2021-04-06 MED ORDER — SODIUM CHLORIDE 0.9 % IV SOLN
Freq: Once | INTRAVENOUS | Status: DC
  Filled 2021-04-06: qty 250

## 2021-04-06 NOTE — Patient Instructions (Signed)
[facility]  Discharge Instructions: Thank you for choosing [facility] to provide your oncology and hematology care.   If you have a lab appointment with the [facility], please go directly to the [facility] and check in at the registration area.   Wear comfortable clothing and clothing appropriate for easy access to any Portacath or PICC line.   We strive to give you quality time with your provider. You may need to reschedule your appointment if you arrive late (15 or more minutes).  Arriving late affects you and other patients whose appointments are after yours.  Also, if you miss three or more appointments without notifying the office, you may be dismissed from the clinic at the provider's discretion.      For prescription refill requests, have your pharmacy contact our office and allow 72 hours for refills to be completed.    Today you received the following chemotherapy and/or immunotherapy agents : Vecade     To help prevent nausea and vomiting after your treatment, we encourage you to take your nausea medication as directed.  BELOW ARE SYMPTOMS THAT SHOULD BE REPORTED IMMEDIATELY: *FEVER GREATER THAN 100.4 F (38 C) OR HIGHER *CHILLS OR SWEATING *NAUSEA AND VOMITING THAT IS NOT CONTROLLED WITH YOUR NAUSEA MEDICATION *UNUSUAL SHORTNESS OF BREATH *UNUSUAL BRUISING OR BLEEDING *URINARY PROBLEMS (pain or burning when urinating, or frequent urination) *BOWEL PROBLEMS (unusual diarrhea, constipation, pain near the anus) TENDERNESS IN MOUTH AND THROAT WITH OR WITHOUT PRESENCE OF ULCERS (sore throat, sores in mouth, or a toothache) UNUSUAL RASH, SWELLING OR PAIN  UNUSUAL VAGINAL DISCHARGE OR ITCHING   Items with * indicate a potential emergency and should be followed up as soon as possible or go to the Emergency Department if any problems should occur.  Please show the CHEMOTHERAPY ALERT CARD or IMMUNOTHERAPY ALERT CARD at check-in to the  Emergency Department and triage nurse.  Should you have questions after your visit or need to cancel or reschedule your appointment, please contact [facility]  Dept: [phone]  and follow the prompts.  Office hours are 8:00 a.m. to 4:30 p.m. Monday - Friday. Please note that voicemails left after 4:00 p.m. may not be returned until the following business day.  We are closed weekends and major holidays. You have access to a nurse at all times for urgent questions. Please call the main number to the clinic Dept: [phone] and follow the prompts.   For any non-urgent questions, you may also contact your provider using MyChart. We now offer e-Visits for anyone 53 and older to request care online for non-urgent symptoms. For details visit [email or web address].   Also download the MyChart app! Go to the app store, search "MyChart", open the app, select [facility], and log in with your MyChart username and password.  Due to Covid, a mask is required upon entering the hospital/clinic. If you do not have a mask, one will be given to you upon arrival. For doctor visits, patients may have 1 support person aged 63 or older with them. For treatment visits, patients cannot have anyone with them due to current Covid guidelines and our immunocompromised population.   Calcium Content in Foods Calcium is the most abundant mineral in the body. Most of the body's calcium supply is stored in bones and teeth. Calcium helps many parts of the body function normally, including: Blood and blood vessels. Nerves. Hormones. Muscles. Bones and teeth. When your calcium stores  are low, you may be at risk for low bone mass, bone loss, and broken bones (fractures). When you get enough calcium, it helps to support strong bones and teeththroughout your life. Calcium is especially important for: Children during growth spurts. Girls during adolescence. Women who are pregnant or  breastfeeding. Women after their menstrual cycle stops (postmenopause). Women whose menstrual cycle has stopped due to anorexia nervosa or regular intense exercise. People who cannot eat or digest dairy products. Vegans. Recommended daily amounts of calcium: Women (ages 43 to 72): 1,000 mg per day. Women (ages 45 and older): 1,200 mg per day. Men (ages 13 to 30): 1,000 mg per day. Men (ages 69 and older): 1,200 mg per day. Women (ages 14 to 84): 1,300 mg per day. Men (ages 48 to 51): 1,300 mg per day. General information Eat foods that are high in calcium. Try to get most of your calcium from food. Some people may benefit from taking calcium supplements. Check with your health care provider or diet and nutrition specialist (dietitian) before starting any calcium supplements. Calcium supplements may interact with certain medicines. Too much calcium may cause other health problems, such as constipation and kidney stones. For the body to absorb calcium, it needs vitamin D. Sources of vitamin D include: Skin exposure to direct sunlight. Foods, such as egg yolks, liver, mushrooms, saltwater fish, and fortified milk. Vitamin D supplements. Check with your health care provider or dietitian before starting any vitamin D supplements. What foods are high in calcium?  Foods that are high in calcium contain more than 100 milligrams per serving. Fruits Fortified orange juice or other fruit juice, 300 mg per 8 oz serving. Vegetables Collard greens, 360 mg per 8 oz serving. Kale, 100 mg per 8 oz serving. Bok choy, 160 mg per 8 oz serving. Grains Fortified ready-to-eat cereals, 100 to 1,000 mg per 8 oz serving. Fortified frozen waffles, 200 mg in 2 waffles. Oatmeal, 140 mg in 1 cup. Meats and other proteins Sardines, canned with bones, 325 mg per 3 oz serving. Salmon, canned with bones, 180 mg per 3 oz serving. Canned shrimp, 125 mg per 3 oz serving. Baked beans, 160 mg per 4 oz serving. Tofu,  firm, made with calcium sulfate, 253 mg per 4 oz serving. Dairy Yogurt, plain, low-fat, 310 mg per 6 oz serving. Nonfat milk, 300 mg per 8 oz serving. American cheese, 195 mg per 1 oz serving. Cheddar cheese, 205 mg per 1 oz serving. Cottage cheese 2%, 105 mg per 4 oz serving. Fortified soy, rice, or almond milk, 300 mg per 8 oz serving. Mozzarella, part skim, 210 mg per 1 oz serving. The items listed above may not be a complete list of foods high in calcium. Actual amounts of calcium may be different depending on processing. Contact a dietitian for more information. What foods are lower in calcium? Foods that are lower in calcium contain 50 mg or less per serving. Fruits Apple, about 6 mg. Banana, about 12 mg. Vegetables Lettuce, 19 mg per 2 oz serving. Tomato, about 11 mg. Grains Rice, 4 mg per 6 oz serving. Boiled potatoes, 14 mg per 8 oz serving. White bread, 6 mg per slice. Meats and other proteins Egg, 27 mg per 2 oz serving. Red meat, 7 mg per 4 oz serving. Chicken, 17 mg per 4 oz serving. Fish, cod, or trout, 20 mg per 4 oz serving. Dairy Cream cheese, regular, 14 mg per 1 Tbsp serving. Brie cheese, 50 mg per 1 oz  serving. Parmesan cheese, 70 mg per 1 Tbsp serving. The items listed above may not be a complete list of foods lower in calcium. Actual amounts of calcium may be different depending on processing. Contact a dietitian for more information. Summary Calcium is an important mineral in the body because it affects many functions. Getting enough calcium helps support strong bones and teeth throughout your life. Try to get most of your calcium from food. Calcium supplements may interact with certain medicines. Check with your health care provider or dietitian before starting any calcium supplements. This information is not intended to replace advice given to you by your health care provider. Make sure you discuss any questions you have with your healthcare  provider. Document Revised: 02/02/2020 Document Reviewed: 02/02/2020 Elsevier Patient Education  [place].

## 2021-04-06 NOTE — Progress Notes (Signed)
Per Dr. [name] okay to administer Zometa today. Patient provided hand out on calcium rich foods and will take TUMS as needed to increase his serum calcium level. Patient verbalized understanding.

## 2021-04-12 NOTE — Progress Notes (Signed)
[facility] [phone]   [phone]  PROGRESS NOTE  Patient Care Team: Antony Contras, MD as PCP - General (Family Medicine)  Hematological/Oncological History # IgG Kappa Multiple Myeloma 02/02/2021:  Presented to [facility] ED due to right sided flank tenderness with bruising. CT abdomen/pelvis: Multiple small lytic lesions in the thoracolumbar spine and bilateral pelvis -SPEP: IgG 2,082 (H), M Protein 1.8 (H). IFE shows IgG monoclonal protein with kappa light chain specificity.  -LDH 169, CBC normal, CMP normal except for sodium 131 (L), Chloride 96 (L).   02/14/2021: Establish care with Dede Query PA-C 02/22/2021: bone marrow biopsy confirms the diagnosis of Multiple Myeloma with a monoclonal plasma cell population.  03/16/2021: Cycle 1 Day 1 of VRd chemotherapy  04/06/2021: Cycle 2 Day 1 of VRd chemotherapy   Interval History:  Samuel [name] 65 y.o. male with medical history significant for IgG Kappa multiple myeloma who presents for a follow up visit. The patient's last visit was on 03/30/2021. In the interim since the last visit he has continued on VRd chemotherapy.   On exam today Mr. Splinter is accompanied by his wife. He reports that he is been tolerating the chemotherapy quite well.  He notes he is not having any side effects as result of the [name] shot and also reports no symptoms from the [name].  He is however having some issues with the steroids.  He reports that he is "wired".  He does not sleep but when he takes the steroids.  It makes him very active.  He has been doing his best to eat right and otherwise feels good.  He is been increasing his intake of red meat and calcium.  He notes he is also been compliant with his aspirin daily.  He reports his bowel movements are regular and he takes MiraLAX every other day.  He notes that recently he had an issue with some urgency where he barely made it to the house and toilet in time.  Otherwise he  has no questions concerns or complaints today.  He is willing and able to proceed with treatment at this time.  He has had no issues with fatigue, bleeding, bruising, or dark stools.  Denies any fevers, chills, sweats, nausea, vomiting or diarrhea.  A full 10 point ROS is listed below.   MEDICAL HISTORY:  Past Medical History:  Diagnosis Date   Asthma    High cholesterol    under control.    Perennial allergic rhinitis    Sciatic pain, right    Seasonal allergic rhinitis    Thyroid disease     SURGICAL HISTORY: Past Surgical History:  Procedure Laterality Date   NASAL SINUS SURGERY     NECK SURGERY  1996    SOCIAL HISTORY: Social History   Socioeconomic History   Marital status: Married    Spouse name: Not on file   Number of children: Not on file   Years of education: Not on file   Highest education level: Not on file  Occupational History   Not on file  Tobacco Use   Smoking status: Former    Packs/day: 0.10    Years: 5.00    Pack years: 0.50    Types: Cigarettes    Quit date: 10/21/1982    Years since quitting: 38.5   Smokeless tobacco: Never  Substance and Sexual Activity   Alcohol use: Yes    Comment: 1 drink daily   Drug use: No   Sexual activity:  Not on file  Other Topics Concern   Not on file  Social History Narrative   Not on file   Social Determinants of Health   Financial Resource Strain: Not on file  Food Insecurity: Not on file  Transportation Needs: Not on file  Physical Activity: Not on file  Stress: Not on file  Social Connections: Not on file  Intimate Partner Violence: Not on file    FAMILY HISTORY: Family History  Problem Relation Age of Onset   Allergies Father    Allergies Mother    Hypertension Other    Heart disease Other    Breast cancer Paternal Grandmother     ALLERGIES:  is allergic to clarithromycin and penicillins.  MEDICATIONS:  Current Outpatient Medications  Medication Sig Dispense Refill   acyclovir (ZOVIRAX)  400 MG tablet Take 1 tablet (400 mg total) by mouth 2 (two) times daily. 60 tablet 3   allopurinol (ZYLOPRIM) 300 MG tablet Take 300 mg by mouth daily.     ALPRAZolam (XANAX) 1 MG tablet Take 1 mg by mouth 2 (two) times daily as needed for anxiety.     ASSESS FULL RANGE PEAK METER DEVI as directed.     cholecalciferol (VITAMIN D) 1000 UNITS tablet Take 3,000 Units by mouth daily.     dexamethasone (DECADRON) 4 MG tablet Take 10 tablets (40 mg total) by mouth once a week. 40 tablet 3   EPINEPHrine (EPI-PEN) 0.3 mg/0.3 mL DEVI Inject 0.3 mg into the muscle once as needed.     ezetimibe-simvastatin (VYTORIN) 10-40 MG per tablet Take 1 tablet by mouth at bedtime.     levothyroxine (SYNTHROID) 150 MCG tablet Take 150 mcg by mouth every other day. Alternates with 175 mcg     levothyroxine (SYNTHROID) 150 MCG tablet Synthroid 150 mcg tablet     montelukast (SINGULAIR) 10 MG tablet Take 10 mg by mouth at bedtime.     Multiple Vitamin (MULTIVITAMIN) tablet Take 1 tablet by mouth daily.     omeprazole (PRILOSEC) 40 MG capsule Take 1 capsule by mouth daily.     ondansetron (ZOFRAN) 8 MG tablet Take 1 tablet (8 mg total) by mouth every 8 (eight) hours as needed for nausea or vomiting. 30 tablet 0   prochlorperazine (COMPAZINE) 10 MG tablet Take 1 tablet (10 mg total) by mouth every 6 (six) hours as needed for nausea or vomiting. 30 tablet 0   [name] 25 MG capsule TAKE 1 CAPSULE DAILY FOR 14 DAYS ON, 7 DAYS OFF, REPEAT EVERY 21 DAYS 14 capsule 0   sildenafil (REVATIO) 20 MG tablet 2-5 tablets one hour prior to sexual intimacy     tamsulosin (FLOMAX) 0.4 MG CAPS capsule 1 capsule     triamcinolone cream (KENALOG) 0.1 % Apply topically.     Zinc 50 MG TABS Take by mouth.     No current facility-administered medications for this visit.    REVIEW OF SYSTEMS:   Constitutional: ( - ) fevers, ( - )  chills , ( - ) night sweats Eyes: ( - ) blurriness of vision, ( - ) double vision, ( - ) watery eyes Ears,  nose, mouth, throat, and face: ( - ) mucositis, ( - ) sore throat Respiratory: ( - ) cough, ( - ) dyspnea, ( - ) wheezes Cardiovascular: ( - ) palpitation, ( - ) chest discomfort, ( - ) lower extremity swelling Gastrointestinal:  ( - ) nausea, ( - ) heartburn, ( - ) change in bowel  habits Skin: ( - ) abnormal skin rashes Lymphatics: ( - ) new lymphadenopathy, ( - ) easy bruising Neurological: ( - ) numbness, ( - ) tingling, ( - ) new weaknesses Behavioral/Psych: ( - ) mood change, ( - ) new changes  All other systems were reviewed with the patient and are negative.  PHYSICAL EXAMINATION: ECOG PERFORMANCE STATUS: 0 - Asymptomatic  Vitals:   04/13/21 1138  BP: (!) 141/75  Pulse: 64  Resp: 18  Temp: 97.9 F (36.6 C)  SpO2: 99%    Filed Weights   04/13/21 1138  Weight: 213 lb (96.6 kg)     GENERAL: well appearing middle aged Caucasian male alert, no distress and comfortable SKIN: skin color, texture, turgor are normal, no rashes or significant lesions EYES: conjunctiva are pink and non-injected, sclera clear LUNGS: clear to auscultation and percussion with normal breathing effort HEART: regular rate & rhythm and no murmurs and no lower extremity edema Musculoskeletal: no cyanosis of digits and no clubbing  PSYCH: alert & oriented x 3, fluent speech NEURO: no focal motor/sensory deficits  LABORATORY DATA:  I have reviewed the data as listed CBC Latest Ref Rng & Units 04/13/2021 04/06/2021 03/30/2021  WBC 4.0 - 10.5 K/uL 7.2 4.9 6.7  Hemoglobin 13.0 - 17.0 g/dL 12.7(L) 12.8(L) 12.8(L)  Hematocrit 39.0 - 52.0 % 36.0(L) 37.6(L) 36.9(L)  Platelets 150 - 400 K/uL 178 188 195    CMP Latest Ref Rng & Units 04/13/2021 04/06/2021 03/30/2021  Glucose 70 - 99 mg/dL 106(H) 99 113(H)  BUN 8 - 23 mg/dL '17 22 16  ' Creatinine 0.61 - 1.24 mg/dL 0.89 1.19 1.04  Sodium 135 - 145 mmol/L 128(L) 129(L) 131(L)  Potassium 3.5 - 5.1 mmol/L 4.3 4.7 4.2  Chloride 98 - 111 mmol/L 98 98 98  CO2 22 - 32  mmol/L '23 25 24  ' Calcium 8.9 - 10.3 mg/dL 8.8(L) 8.9 8.6(L)  Total Protein 6.5 - 8.1 g/dL 7.0 7.1 7.0  Total Bilirubin 0.3 - 1.2 mg/dL 0.3 0.6 0.3  Alkaline Phos 38 - 126 U/L 62 52 63  AST 15 - 41 U/L '25 30 27  ' ALT 0 - 44 U/L 24 31 32    Lab Results  Component Value Date   MPROTEIN 1.3 (H) 03/16/2021   MPROTEIN 1.8 (H) 02/02/2021   Lab Results  Component Value Date   KPAFRELGTCHN 41.8 (H) 03/16/2021   KPAFRELGTCHN 45.1 (H) 02/14/2021   LAMBDASER 13.7 03/16/2021   LAMBDASER 13.2 02/14/2021   KAPLAMBRATIO 3.05 (H) 03/16/2021   KAPLAMBRATIO 1.36 (L) 02/20/2021   KAPLAMBRATIO 3.42 (H) 02/14/2021    RADIOGRAPHIC STUDIES: I have personally reviewed the radiological images as listed and agreed with the findings in the report: lytic lesions in the hip bones bilaterally.  No results found.   ASSESSMENT & PLAN Leelynd Maldonado Halbur 65 y.o. male with medical history significant for IgG Kappa multiple myeloma who presents for a follow up visit.   After review of the labs, review of the records, and discussion with the patient the findings are most consistent with an IgG kappa multiple myeloma.  The patient has 2 lytic lesions within the pelvic bones (more noted in spine on CT scan) as well as 10% plasma cells within the bone marrow biopsy.  This combined with his serological findings confirm the diagnosis of multiple myeloma.  The treatment of choice for this patient's multiple myeloma is VRd. This will consist of bortezomib 1.74m/m2 on days 1, 8, 15, dexamethasone 464mon days  1,8, and 15, and revlimid 57m PO daily days 1-14. Cycles will consist of 21 days. We will use this regimen to stabilize the patient's myeloma, then make a referral to a BMT center of his choosing for consideration of a bone marrow transplant once he has been noted to have a good response (VGPR or better). Zometa can be started after dental clearance is obtained. This will be continued x 2 years.   R-IPSS score: Stage 2.  PFS of 42 months  # IgG Kappa Multiple Myeloma --diagnostic criteria was met with monoclonal plasma cells in the bone marrow and lytic lesions on the bone --recommend proceeding with VRd chemotherapy as noted above --patient is young and healthy enough for consideration of BMT, though he is borderline with his age. Will consider referral pending response to treatment.  -- Cycle 1 Day 1 started on 03/16/2021 --today is Cycle 2 Day 8 --RTC in 2 weeks for Cycle 3 Day 1 of treatment  #Supportive Care -- chemotherapy education complete --zometa therapy started on 04/05/2021 (4 mg IV q 3 months), next dose Sept 2022 --ASA 89mPO daily for thromboprophylaxis on revlimid -- zofran 41m34m8H PRN and compazine 75m85m q6H for nausea -- acyclovir 400mg3mBID for VCZ prophylaxis -- no pain medication required at this time.   No orders of the defined types were placed in this encounter.  All questions were answered. The patient knows to call the clinic with any problems, questions or concerns.  A total of more than 30 minutes were spent on this encounter with face-to-face time and non-face-to-face time, including preparing to see the patient, ordering tests and/or medications, counseling the patient and coordination of care as outlined above.   Valina Maes Ledell PeoplesDepartment of Hematology/Oncology [facility]: [phone]: [phone]: [email or web address]  04/13/2021 2:51 PM

## 2021-04-13 ENCOUNTER — Inpatient Hospital Stay: Payer: Managed Care, Other (non HMO)

## 2021-04-13 ENCOUNTER — Other Ambulatory Visit: Payer: Self-pay

## 2021-04-13 ENCOUNTER — Inpatient Hospital Stay (HOSPITAL_BASED_OUTPATIENT_CLINIC_OR_DEPARTMENT_OTHER): Payer: Managed Care, Other (non HMO) | Admitting: Hematology and Oncology

## 2021-04-13 VITALS — BP 141/75 | HR 64 | Temp 97.9°F | Resp 18 | Ht 69.5 in | Wt 213.0 lb

## 2021-04-13 DIAGNOSIS — C9000 Multiple myeloma not having achieved remission: Secondary | ICD-10-CM

## 2021-04-13 DIAGNOSIS — Z5112 Encounter for antineoplastic immunotherapy: Secondary | ICD-10-CM | POA: Diagnosis not present

## 2021-04-13 DIAGNOSIS — M899 Disorder of bone, unspecified: Secondary | ICD-10-CM | POA: Diagnosis not present

## 2021-04-13 LAB — CMP (CANCER CENTER ONLY)
ALT: 24 U/L (ref 0–44)
AST: 25 U/L (ref 15–41)
Albumin: 3.5 g/dL (ref 3.5–5.0)
Alkaline Phosphatase: 62 U/L (ref 38–126)
Anion gap: 7 (ref 5–15)
BUN: 17 mg/dL (ref 8–23)
CO2: 23 mmol/L (ref 22–32)
Calcium: 8.8 mg/dL — ABNORMAL LOW (ref 8.9–10.3)
Chloride: 98 mmol/L (ref 98–111)
Creatinine: 0.89 mg/dL (ref 0.61–1.24)
GFR, Estimated: >60 mL/min (ref >60)
Glucose, Bld: 106 mg/dL — ABNORMAL HIGH (ref 70–99)
Potassium: 4.3 mmol/L (ref 3.5–5.1)
Sodium: 128 mmol/L — ABNORMAL LOW (ref 135–145)
Total Bilirubin: 0.3 mg/dL (ref 0.3–1.2)
Total Protein: 7 g/dL (ref 6.5–8.1)

## 2021-04-13 LAB — CBC WITH DIFFERENTIAL (CANCER CENTER ONLY)
Abs Immature Granulocytes: 0.03 10*3/uL (ref 0.00–0.07)
Basophils Absolute: 0 10*3/uL (ref 0.0–0.1)
Basophils Relative: 0 %
Eosinophils Absolute: 0 10*3/uL (ref 0.0–0.5)
Eosinophils Relative: 0 %
HCT: 36 % — ABNORMAL LOW (ref 39.0–52.0)
Hemoglobin: 12.7 g/dL — ABNORMAL LOW (ref 13.0–17.0)
Immature Granulocytes: 0 %
Lymphocytes Relative: 5 %
Lymphs Abs: 0.4 10*3/uL — ABNORMAL LOW (ref 0.7–4.0)
MCH: 32 pg (ref 26.0–34.0)
MCHC: 35.3 g/dL (ref 30.0–36.0)
MCV: 90.7 fL (ref 80.0–100.0)
Monocytes Absolute: 0.1 10*3/uL (ref 0.1–1.0)
Monocytes Relative: 2 %
Neutro Abs: 6.7 10*3/uL (ref 1.7–7.7)
Neutrophils Relative %: 93 %
Platelet Count: 178 10*3/uL (ref 150–400)
RBC: 3.97 MIL/uL — ABNORMAL LOW (ref 4.22–5.81)
RDW: 13.6 % (ref 11.5–15.5)
WBC Count: 7.2 10*3/uL (ref 4.0–10.5)
nRBC: 0 % (ref 0.0–0.2)

## 2021-04-13 LAB — LACTATE DEHYDROGENASE: LDH: 225 U/L — ABNORMAL HIGH (ref 98–192)

## 2021-04-13 MED ORDER — BORTEZOMIB CHEMO SQ INJECTION 3.5 MG (2.5MG/ML)
1.3000 mg/m2 | Freq: Once | INTRAMUSCULAR | Status: AC
  Administered 2021-04-13: 2.75 mg via SUBCUTANEOUS
  Filled 2021-04-13: qty 1.1

## 2021-04-13 NOTE — Patient Instructions (Signed)
[facility]  Discharge Instructions: Thank you for choosing [facility] to provide your oncology and hematology care.   If you have a lab appointment with the [facility], please go directly to the [facility] and check in at the registration area.   Wear comfortable clothing and clothing appropriate for easy access to any Portacath or PICC line.   We strive to give you quality time with your provider. You may need to reschedule your appointment if you arrive late (15 or more minutes).  Arriving late affects you and other patients whose appointments are after yours.  Also, if you miss three or more appointments without notifying the office, you may be dismissed from the clinic at the provider's discretion.      For prescription refill requests, have your pharmacy contact our office and allow 72 hours for refills to be completed.    Today you received the following chemotherapy and/or immunotherapy agents : Vecade     To help prevent nausea and vomiting after your treatment, we encourage you to take your nausea medication as directed.  BELOW ARE SYMPTOMS THAT SHOULD BE REPORTED IMMEDIATELY: *FEVER GREATER THAN 100.4 F (38 C) OR HIGHER *CHILLS OR SWEATING *NAUSEA AND VOMITING THAT IS NOT CONTROLLED WITH YOUR NAUSEA MEDICATION *UNUSUAL SHORTNESS OF BREATH *UNUSUAL BRUISING OR BLEEDING *URINARY PROBLEMS (pain or burning when urinating, or frequent urination) *BOWEL PROBLEMS (unusual diarrhea, constipation, pain near the anus) TENDERNESS IN MOUTH AND THROAT WITH OR WITHOUT PRESENCE OF ULCERS (sore throat, sores in mouth, or a toothache) UNUSUAL RASH, SWELLING OR PAIN  UNUSUAL VAGINAL DISCHARGE OR ITCHING   Items with * indicate a potential emergency and should be followed up as soon as possible or go to the Emergency Department if any problems should occur.  Please show the CHEMOTHERAPY ALERT CARD or IMMUNOTHERAPY ALERT CARD at check-in to the  Emergency Department and triage nurse.  Should you have questions after your visit or need to cancel or reschedule your appointment, please contact [facility]  Dept: [phone]  and follow the prompts.  Office hours are 8:00 a.m. to 4:30 p.m. Monday - Friday. Please note that voicemails left after 4:00 p.m. may not be returned until the following business day.  We are closed weekends and major holidays. You have access to a nurse at all times for urgent questions. Please call the main number to the clinic Dept: [phone] and follow the prompts.   For any non-urgent questions, you may also contact your provider using MyChart. We now offer e-Visits for anyone 64 and older to request care online for non-urgent symptoms. For details visit [email or web address].   Also download the MyChart app! Go to the app store, search "MyChart", open the app, select [facility], and log in with your MyChart username and password.  Due to Covid, a mask is required upon entering the hospital/clinic. If you do not have a mask, one will be given to you upon arrival. For doctor visits, patients may have 1 support person aged 12 or older with them. For treatment visits, patients cannot have anyone with them due to current Covid guidelines and our immunocompromised population.   Calcium Content in Foods Calcium is the most abundant mineral in the body. Most of the body's calcium supply is stored in bones and teeth. Calcium helps many parts of the body function normally, including: Blood and blood vessels. Nerves. Hormones. Muscles. Bones and teeth. When your calcium stores  are low, you may be at risk for low bone mass, bone loss, and broken bones (fractures). When you get enough calcium, it helps to support strong bones and teeththroughout your life. Calcium is especially important for: Children during growth spurts. Girls during adolescence. Women who are pregnant or  breastfeeding. Women after their menstrual cycle stops (postmenopause). Women whose menstrual cycle has stopped due to anorexia nervosa or regular intense exercise. People who cannot eat or digest dairy products. Vegans. Recommended daily amounts of calcium: Women (ages 62 to 89): 1,000 mg per day. Women (ages 35 and older): 1,200 mg per day. Men (ages 61 to 74): 1,000 mg per day. Men (ages 75 and older): 1,200 mg per day. Women (ages 21 to 75): 1,300 mg per day. Men (ages 51 to 74): 1,300 mg per day. General information Eat foods that are high in calcium. Try to get most of your calcium from food. Some people may benefit from taking calcium supplements. Check with your health care provider or diet and nutrition specialist (dietitian) before starting any calcium supplements. Calcium supplements may interact with certain medicines. Too much calcium may cause other health problems, such as constipation and kidney stones. For the body to absorb calcium, it needs vitamin D. Sources of vitamin D include: Skin exposure to direct sunlight. Foods, such as egg yolks, liver, mushrooms, saltwater fish, and fortified milk. Vitamin D supplements. Check with your health care provider or dietitian before starting any vitamin D supplements. What foods are high in calcium?  Foods that are high in calcium contain more than 100 milligrams per serving. Fruits Fortified orange juice or other fruit juice, 300 mg per 8 oz serving. Vegetables Collard greens, 360 mg per 8 oz serving. Kale, 100 mg per 8 oz serving. Bok choy, 160 mg per 8 oz serving. Grains Fortified ready-to-eat cereals, 100 to 1,000 mg per 8 oz serving. Fortified frozen waffles, 200 mg in 2 waffles. Oatmeal, 140 mg in 1 cup. Meats and other proteins Sardines, canned with bones, 325 mg per 3 oz serving. Salmon, canned with bones, 180 mg per 3 oz serving. Canned shrimp, 125 mg per 3 oz serving. Baked beans, 160 mg per 4 oz serving. Tofu,  firm, made with calcium sulfate, 253 mg per 4 oz serving. Dairy Yogurt, plain, low-fat, 310 mg per 6 oz serving. Nonfat milk, 300 mg per 8 oz serving. American cheese, 195 mg per 1 oz serving. Cheddar cheese, 205 mg per 1 oz serving. Cottage cheese 2%, 105 mg per 4 oz serving. Fortified soy, rice, or almond milk, 300 mg per 8 oz serving. Mozzarella, part skim, 210 mg per 1 oz serving. The items listed above may not be a complete list of foods high in calcium. Actual amounts of calcium may be different depending on processing. Contact a dietitian for more information. What foods are lower in calcium? Foods that are lower in calcium contain 50 mg or less per serving. Fruits Apple, about 6 mg. Banana, about 12 mg. Vegetables Lettuce, 19 mg per 2 oz serving. Tomato, about 11 mg. Grains Rice, 4 mg per 6 oz serving. Boiled potatoes, 14 mg per 8 oz serving. White bread, 6 mg per slice. Meats and other proteins Egg, 27 mg per 2 oz serving. Red meat, 7 mg per 4 oz serving. Chicken, 17 mg per 4 oz serving. Fish, cod, or trout, 20 mg per 4 oz serving. Dairy Cream cheese, regular, 14 mg per 1 Tbsp serving. Brie cheese, 50 mg per 1 oz  serving. Parmesan cheese, 70 mg per 1 Tbsp serving. The items listed above may not be a complete list of foods lower in calcium. Actual amounts of calcium may be different depending on processing. Contact a dietitian for more information. Summary Calcium is an important mineral in the body because it affects many functions. Getting enough calcium helps support strong bones and teeth throughout your life. Try to get most of your calcium from food. Calcium supplements may interact with certain medicines. Check with your health care provider or dietitian before starting any calcium supplements. This information is not intended to replace advice given to you by your health care provider. Make sure you discuss any questions you have with your healthcare  provider. Document Revised: 02/02/2020 Document Reviewed: 02/02/2020 Elsevier Patient Education  [place].

## 2021-04-16 ENCOUNTER — Other Ambulatory Visit: Payer: Self-pay | Admitting: *Deleted

## 2021-04-16 DIAGNOSIS — C9000 Multiple myeloma not having achieved remission: Secondary | ICD-10-CM

## 2021-04-16 LAB — MULTIPLE MYELOMA PANEL, SERUM
Albumin SerPl Elph-Mcnc: 3.5 g/dL (ref 2.9–4.4)
Albumin/Glob SerPl: 1.2 (ref 0.7–1.7)
Alpha 1: 0.2 g/dL (ref 0.0–0.4)
Alpha2 Glob SerPl Elph-Mcnc: 0.7 g/dL (ref 0.4–1.0)
B-Globulin SerPl Elph-Mcnc: 0.9 g/dL (ref 0.7–1.3)
Gamma Glob SerPl Elph-Mcnc: 1.2 g/dL (ref 0.4–1.8)
Globulin, Total: 3 g/dL (ref 2.2–3.9)
IgA: 120 mg/dL (ref 61–437)
IgG (Immunoglobin G), Serum: 1188 mg/dL (ref 603–1613)
IgM (Immunoglobulin M), Srm: 62 mg/dL (ref 20–172)
M Protein SerPl Elph-Mcnc: 0.8 g/dL — ABNORMAL HIGH
Total Protein ELP: 6.5 g/dL (ref 6.0–8.5)

## 2021-04-16 LAB — KAPPA/LAMBDA LIGHT CHAINS
Kappa free light chain: 31.5 mg/L — ABNORMAL HIGH (ref 3.3–19.4)
Kappa, lambda light chain ratio: 2.46 — ABNORMAL HIGH (ref 0.26–1.65)
Lambda free light chains: 12.8 mg/L (ref 5.7–26.3)

## 2021-04-16 MED ORDER — LENALIDOMIDE 25 MG PO CAPS: ORAL_CAPSULE | ORAL | 0 refills | Status: DC

## 2021-04-17 ENCOUNTER — Other Ambulatory Visit: Payer: Self-pay | Admitting: Hematology and Oncology

## 2021-04-17 DIAGNOSIS — C9000 Multiple myeloma not having achieved remission: Secondary | ICD-10-CM

## 2021-04-20 ENCOUNTER — Other Ambulatory Visit: Payer: Self-pay | Admitting: *Deleted

## 2021-04-20 ENCOUNTER — Inpatient Hospital Stay: Payer: Managed Care, Other (non HMO) | Attending: Hematology and Oncology

## 2021-04-20 ENCOUNTER — Telehealth: Payer: Self-pay | Admitting: *Deleted

## 2021-04-20 ENCOUNTER — Other Ambulatory Visit: Payer: Self-pay

## 2021-04-20 ENCOUNTER — Inpatient Hospital Stay: Payer: Managed Care, Other (non HMO)

## 2021-04-20 ENCOUNTER — Ambulatory Visit (HOSPITAL_COMMUNITY)
Admission: RE | Admit: 2021-04-20 | Discharge: 2021-04-20 | Disposition: A | Discharge status: Home / Self Care | Payer: Managed Care, Other (non HMO) | Source: Ambulatory Visit | Attending: Hematology and Oncology | Admitting: Hematology and Oncology

## 2021-04-20 VITALS — BP 123/72 | HR 57 | Temp 98.6°F | Resp 18

## 2021-04-20 DIAGNOSIS — Z87891 Personal history of nicotine dependence: Secondary | ICD-10-CM | POA: Insufficient documentation

## 2021-04-20 DIAGNOSIS — Z7982 Long term (current) use of aspirin: Secondary | ICD-10-CM | POA: Insufficient documentation

## 2021-04-20 DIAGNOSIS — M7989 Other specified soft tissue disorders: Secondary | ICD-10-CM | POA: Diagnosis present

## 2021-04-20 DIAGNOSIS — M79606 Pain in leg, unspecified: Secondary | ICD-10-CM | POA: Diagnosis not present

## 2021-04-20 DIAGNOSIS — C9000 Multiple myeloma not having achieved remission: Secondary | ICD-10-CM

## 2021-04-20 DIAGNOSIS — Z79899 Other long term (current) drug therapy: Secondary | ICD-10-CM | POA: Insufficient documentation

## 2021-04-20 DIAGNOSIS — E079 Disorder of thyroid, unspecified: Secondary | ICD-10-CM | POA: Insufficient documentation

## 2021-04-20 DIAGNOSIS — Z5112 Encounter for antineoplastic immunotherapy: Secondary | ICD-10-CM | POA: Insufficient documentation

## 2021-04-20 LAB — CMP (CANCER CENTER ONLY)
ALT: 25 U/L (ref 0–44)
AST: 22 U/L (ref 15–41)
Albumin: 4 g/dL (ref 3.5–5.0)
Alkaline Phosphatase: 56 U/L (ref 38–126)
Anion gap: 8 (ref 5–15)
BUN: 13 mg/dL (ref 8–23)
CO2: 26 mmol/L (ref 22–32)
Calcium: 8.4 mg/dL — ABNORMAL LOW (ref 8.9–10.3)
Chloride: 93 mmol/L — ABNORMAL LOW (ref 98–111)
Creatinine: 0.87 mg/dL (ref 0.61–1.24)
GFR, Estimated: >60 mL/min (ref >60)
Glucose, Bld: 92 mg/dL (ref 70–99)
Potassium: 4.4 mmol/L (ref 3.5–5.1)
Sodium: 127 mmol/L — ABNORMAL LOW (ref 135–145)
Total Bilirubin: 0.7 mg/dL (ref 0.3–1.2)
Total Protein: 7.2 g/dL (ref 6.5–8.1)

## 2021-04-20 LAB — CBC WITH DIFFERENTIAL (CANCER CENTER ONLY)
Abs Immature Granulocytes: 0.06 10*3/uL (ref 0.00–0.07)
Basophils Absolute: 0 10*3/uL (ref 0.0–0.1)
Basophils Relative: 0 %
Eosinophils Absolute: 0.7 10*3/uL — ABNORMAL HIGH (ref 0.0–0.5)
Eosinophils Relative: 10 %
HCT: 36.8 % — ABNORMAL LOW (ref 39.0–52.0)
Hemoglobin: 13 g/dL (ref 13.0–17.0)
Immature Granulocytes: 1 %
Lymphocytes Relative: 11 %
Lymphs Abs: 0.8 10*3/uL (ref 0.7–4.0)
MCH: 32.4 pg (ref 26.0–34.0)
MCHC: 35.3 g/dL (ref 30.0–36.0)
MCV: 91.8 fL (ref 80.0–100.0)
Monocytes Absolute: 0.4 10*3/uL (ref 0.1–1.0)
Monocytes Relative: 6 %
Neutro Abs: 5 10*3/uL (ref 1.7–7.7)
Neutrophils Relative %: 72 %
Platelet Count: 197 10*3/uL (ref 150–400)
RBC: 4.01 MIL/uL — ABNORMAL LOW (ref 4.22–5.81)
RDW: 13.4 % (ref 11.5–15.5)
WBC Count: 6.9 10*3/uL (ref 4.0–10.5)
nRBC: 0 % (ref 0.0–0.2)

## 2021-04-20 LAB — LACTATE DEHYDROGENASE: LDH: 226 U/L — ABNORMAL HIGH (ref 98–192)

## 2021-04-20 MED ORDER — BORTEZOMIB CHEMO SQ INJECTION 3.5 MG (2.5MG/ML)
1.3000 mg/m2 | Freq: Once | INTRAMUSCULAR | Status: AC
  Administered 2021-04-20: 2.75 mg via SUBCUTANEOUS
  Filled 2021-04-20: qty 1.1

## 2021-04-20 NOTE — Telephone Encounter (Signed)
[name] patient regarding results of Doppler study of LE. Spoke with patient and advised that the study was negative-no blood clots.  Dr. [name] recommends further eval/treatment with his PCP or ortho. Pt voiced understanding and will follow up with PCP after the holiday weekend

## 2021-04-20 NOTE — Patient Instructions (Signed)
Bortezomib injection What is this medication? BORTEZOMIB (bor TEZ oh mib) targets proteins in cancer cells and stops thecancer cells from growing. It treats multiple myeloma and mantle cell lymphoma. This medicine may be used for other purposes; ask your health care provider orpharmacist if you have questions. COMMON BRAND NAME(S): [name] What should I tell my care team before I take this medication? They need to know if you have any of these conditions: dehydration diabetes (high blood sugar) heart disease liver disease tingling of the fingers or toes or other nerve disorder an unusual or allergic reaction to bortezomib, mannitol, boron, other medicines, foods, dyes, or preservatives pregnant or trying to get pregnant breast-feeding How should I use this medication? This medicine is injected into a vein or under the skin. It is given by ahealth care provider in a hospital or clinic setting. Talk to your health care provider about the use of this medicine in children.Special care may be needed. Overdosage: If you think you have taken too much of this medicine contact apoison control center or emergency room at once. NOTE: This medicine is only for you. Do not share this medicine with others. What if I miss a dose? Keep appointments for follow-up doses. It is important not to miss your dose.Call your health care provider if you are unable to keep an appointment. What may interact with this medication? This medicine may interact with the following medications: ketoconazole rifampin This list may not describe all possible interactions. Give your health care provider a list of all the medicines, herbs, non-prescription drugs, or dietary supplements you use. Also tell them if you smoke, drink alcohol, or use illegaldrugs. Some items may interact with your medicine. What should I watch for while using this medication? Your condition will be monitored carefully while you are receiving  thismedicine. You may need blood work done while you are taking this medicine. You may get drowsy or dizzy. Do not drive, use machinery, or do anything that needs mental alertness until you know how this medicine affects you. Do not stand up or sit up quickly, especially if you are an older patient. Thisreduces the risk of dizzy or fainting spells This medicine may increase your risk of getting an infection. Call your health care provider for advice if you get a fever, chills, sore throat, or other symptoms of a cold or flu. Do not treat yourself. Try to avoid being aroundpeople who are sick. Check with your health care provider if you have severe diarrhea, nausea, and vomiting, or if you sweat a lot. The loss of too much body fluid may make itdangerous for you to take this medicine. Do not become pregnant while taking this medicine or for 7 months after stopping it. Women should inform their health care provider if they wish to become pregnant or think they might be pregnant. Men should not father a child while taking this medicine and for 4 months after stopping it. There is a potential for serious harm to an unborn child. Talk to your health care provider for more information. Do not breast-feed an infant while taking thismedicine or for 2 months after stopping it. This medicine may make it more difficult to get pregnant or father a child.Talk to your health care provider if you are concerned about your fertility. What side effects may I notice from receiving this medication? Side effects that you should report to your doctor or health care professionalas soon as possible: allergic reactions (skin rash; itching or hives; swelling   of the face, lips, or tongue) bleeding (bloody or black, tarry stools; red or dark brown urine; spitting up blood or brown material that looks like coffee grounds; red spots on the skin; unusual bruising or bleeding from the eye, gums, or nose) blurred vision or changes in  vision confusion constipation headache heart failure (trouble breathing; fast, irregular heartbeat; sudden weight gain; swelling of the ankles, feet, hands) infection (fever, chills, cough, sore throat, pain or trouble passing urine) lack or loss of appetite liver injury (dark yellow or brown urine; general ill feeling or flu-like symptoms; loss of appetite, right upper belly pain; yellowing of the eyes or skin) low blood pressure (dizziness; feeling faint or lightheaded, falls; unusually weak or tired) muscle cramps pain, redness, or irritation at site where injected pain, tingling, numbness in the hands or feet seizures trouble breathing unusual bruising or bleeding Side effects that usually do not require medical attention (report to yourdoctor or health care professional if they continue or are bothersome): diarrhea nausea stomach pain trouble sleeping vomiting This list may not describe all possible side effects. Call your doctor for medical advice about side effects. You may report side effects to FDA at1-800-FDA-1088. Where should I keep my medication? This medicine is given in a hospital or clinic. It will not be stored at home. NOTE: This sheet is a summary. It may not cover all possible information. If you have questions about this medicine, talk to your doctor, pharmacist, orhealth care provider.  2022 Elsevier/Gold Standard (2020-09-28 13:22:53)  

## 2021-04-20 NOTE — Progress Notes (Signed)
Bilateral lower extremity venous duplex has been completed. Preliminary results can be found in CV Proc through chart review.  Results were faxed to Dr. [name].  04/20/21 12:56 PM Samuel [name] RVT

## 2021-04-27 ENCOUNTER — Other Ambulatory Visit: Payer: Self-pay

## 2021-04-27 ENCOUNTER — Inpatient Hospital Stay (HOSPITAL_BASED_OUTPATIENT_CLINIC_OR_DEPARTMENT_OTHER): Payer: Managed Care, Other (non HMO) | Admitting: Hematology and Oncology

## 2021-04-27 ENCOUNTER — Inpatient Hospital Stay: Payer: Managed Care, Other (non HMO)

## 2021-04-27 ENCOUNTER — Encounter: Payer: Self-pay | Admitting: Hematology and Oncology

## 2021-04-27 VITALS — BP 120/75 | HR 59 | Temp 98.3°F | Resp 18 | Ht 69.5 in | Wt 216.0 lb

## 2021-04-27 DIAGNOSIS — C9000 Multiple myeloma not having achieved remission: Secondary | ICD-10-CM

## 2021-04-27 DIAGNOSIS — Z5112 Encounter for antineoplastic immunotherapy: Secondary | ICD-10-CM | POA: Diagnosis present

## 2021-04-27 DIAGNOSIS — E079 Disorder of thyroid, unspecified: Secondary | ICD-10-CM | POA: Diagnosis not present

## 2021-04-27 DIAGNOSIS — M899 Disorder of bone, unspecified: Secondary | ICD-10-CM

## 2021-04-27 DIAGNOSIS — Z87891 Personal history of nicotine dependence: Secondary | ICD-10-CM | POA: Diagnosis not present

## 2021-04-27 DIAGNOSIS — Z79899 Other long term (current) drug therapy: Secondary | ICD-10-CM | POA: Diagnosis not present

## 2021-04-27 DIAGNOSIS — Z7982 Long term (current) use of aspirin: Secondary | ICD-10-CM | POA: Diagnosis not present

## 2021-04-27 LAB — CMP (CANCER CENTER ONLY)
ALT: 23 U/L (ref 0–44)
AST: 22 U/L (ref 15–41)
Albumin: 3.3 g/dL — ABNORMAL LOW (ref 3.5–5.0)
Alkaline Phosphatase: 61 U/L (ref 38–126)
Anion gap: 6 (ref 5–15)
BUN: 18 mg/dL (ref 8–23)
CO2: 24 mmol/L (ref 22–32)
Calcium: 8.6 mg/dL — ABNORMAL LOW (ref 8.9–10.3)
Chloride: 100 mmol/L (ref 98–111)
Creatinine: 0.91 mg/dL (ref 0.61–1.24)
GFR, Estimated: >60 mL/min (ref >60)
Glucose, Bld: 99 mg/dL (ref 70–99)
Potassium: 4.6 mmol/L (ref 3.5–5.1)
Sodium: 130 mmol/L — ABNORMAL LOW (ref 135–145)
Total Bilirubin: 0.4 mg/dL (ref 0.3–1.2)
Total Protein: 6.8 g/dL (ref 6.5–8.1)

## 2021-04-27 LAB — CBC WITH DIFFERENTIAL (CANCER CENTER ONLY)
Abs Immature Granulocytes: 0.03 10*3/uL (ref 0.00–0.07)
Basophils Absolute: 0 10*3/uL (ref 0.0–0.1)
Basophils Relative: 1 %
Eosinophils Absolute: 0.4 10*3/uL (ref 0.0–0.5)
Eosinophils Relative: 6 %
HCT: 35.4 % — ABNORMAL LOW (ref 39.0–52.0)
Hemoglobin: 12.3 g/dL — ABNORMAL LOW (ref 13.0–17.0)
Immature Granulocytes: 0 %
Lymphocytes Relative: 7 %
Lymphs Abs: 0.5 10*3/uL — ABNORMAL LOW (ref 0.7–4.0)
MCH: 32.1 pg (ref 26.0–34.0)
MCHC: 34.7 g/dL (ref 30.0–36.0)
MCV: 92.4 fL (ref 80.0–100.0)
Monocytes Absolute: 0.4 10*3/uL (ref 0.1–1.0)
Monocytes Relative: 6 %
Neutro Abs: 5.7 10*3/uL (ref 1.7–7.7)
Neutrophils Relative %: 80 %
Platelet Count: 193 10*3/uL (ref 150–400)
RBC: 3.83 MIL/uL — ABNORMAL LOW (ref 4.22–5.81)
RDW: 14 % (ref 11.5–15.5)
WBC Count: 7.2 10*3/uL (ref 4.0–10.5)
nRBC: 0 % (ref 0.0–0.2)

## 2021-04-27 LAB — LACTATE DEHYDROGENASE: LDH: 230 U/L — ABNORMAL HIGH (ref 98–192)

## 2021-04-27 MED ORDER — BORTEZOMIB CHEMO SQ INJECTION 3.5 MG (2.5MG/ML)
1.3000 mg/m2 | Freq: Once | INTRAMUSCULAR | Status: AC
  Administered 2021-04-27: 2.75 mg via SUBCUTANEOUS
  Filled 2021-04-27: qty 1.1

## 2021-04-27 NOTE — Patient Instructions (Signed)
Bortezomib injection What is this medication? BORTEZOMIB (bor TEZ oh mib) targets proteins in cancer cells and stops thecancer cells from growing. It treats multiple myeloma and mantle cell lymphoma. This medicine may be used for other purposes; ask your health care provider orpharmacist if you have questions. COMMON BRAND NAME(S): [name] What should I tell my care team before I take this medication? They need to know if you have any of these conditions: dehydration diabetes (high blood sugar) heart disease liver disease tingling of the fingers or toes or other nerve disorder an unusual or allergic reaction to bortezomib, mannitol, boron, other medicines, foods, dyes, or preservatives pregnant or trying to get pregnant breast-feeding How should I use this medication? This medicine is injected into a vein or under the skin. It is given by ahealth care provider in a hospital or clinic setting. Talk to your health care provider about the use of this medicine in children.Special care may be needed. Overdosage: If you think you have taken too much of this medicine contact apoison control center or emergency room at once. NOTE: This medicine is only for you. Do not share this medicine with others. What if I miss a dose? Keep appointments for follow-up doses. It is important not to miss your dose.Call your health care provider if you are unable to keep an appointment. What may interact with this medication? This medicine may interact with the following medications: ketoconazole rifampin This list may not describe all possible interactions. Give your health care provider a list of all the medicines, herbs, non-prescription drugs, or dietary supplements you use. Also tell them if you smoke, drink alcohol, or use illegaldrugs. Some items may interact with your medicine. What should I watch for while using this medication? Your condition will be monitored carefully while you are receiving  thismedicine. You may need blood work done while you are taking this medicine. You may get drowsy or dizzy. Do not drive, use machinery, or do anything that needs mental alertness until you know how this medicine affects you. Do not stand up or sit up quickly, especially if you are an older patient. Thisreduces the risk of dizzy or fainting spells This medicine may increase your risk of getting an infection. Call your health care provider for advice if you get a fever, chills, sore throat, or other symptoms of a cold or flu. Do not treat yourself. Try to avoid being aroundpeople who are sick. Check with your health care provider if you have severe diarrhea, nausea, and vomiting, or if you sweat a lot. The loss of too much body fluid may make itdangerous for you to take this medicine. Do not become pregnant while taking this medicine or for 7 months after stopping it. Women should inform their health care provider if they wish to become pregnant or think they might be pregnant. Men should not father a child while taking this medicine and for 4 months after stopping it. There is a potential for serious harm to an unborn child. Talk to your health care provider for more information. Do not breast-feed an infant while taking thismedicine or for 2 months after stopping it. This medicine may make it more difficult to get pregnant or father a child.Talk to your health care provider if you are concerned about your fertility. What side effects may I notice from receiving this medication? Side effects that you should report to your doctor or health care professionalas soon as possible: allergic reactions (skin rash; itching or hives; swelling   of the face, lips, or tongue) bleeding (bloody or black, tarry stools; red or dark brown urine; spitting up blood or brown material that looks like coffee grounds; red spots on the skin; unusual bruising or bleeding from the eye, gums, or nose) blurred vision or changes in  vision confusion constipation headache heart failure (trouble breathing; fast, irregular heartbeat; sudden weight gain; swelling of the ankles, feet, hands) infection (fever, chills, cough, sore throat, pain or trouble passing urine) lack or loss of appetite liver injury (dark yellow or brown urine; general ill feeling or flu-like symptoms; loss of appetite, right upper belly pain; yellowing of the eyes or skin) low blood pressure (dizziness; feeling faint or lightheaded, falls; unusually weak or tired) muscle cramps pain, redness, or irritation at site where injected pain, tingling, numbness in the hands or feet seizures trouble breathing unusual bruising or bleeding Side effects that usually do not require medical attention (report to yourdoctor or health care professional if they continue or are bothersome): diarrhea nausea stomach pain trouble sleeping vomiting This list may not describe all possible side effects. Call your doctor for medical advice about side effects. You may report side effects to FDA at1-800-FDA-1088. Where should I keep my medication? This medicine is given in a hospital or clinic. It will not be stored at home. NOTE: This sheet is a summary. It may not cover all possible information. If you have questions about this medicine, talk to your doctor, pharmacist, orhealth care provider.  2022 Elsevier/Gold Standard (2020-09-28 13:22:53)  

## 2021-04-27 NOTE — Progress Notes (Signed)
[facility] [phone]   [phone]  PROGRESS NOTE  Patient Care Team: Antony Contras, MD as PCP - General (Family Medicine)  Hematological/Oncological History # IgG Kappa Multiple Myeloma 02/02/2021:  Presented to [place] ED due to right sided flank tenderness with bruising. CT abdomen/pelvis: Multiple small lytic lesions in the thoracolumbar spine and bilateral pelvis -SPEP: IgG 2,082 (H), M Protein 1.8 (H). IFE shows IgG monoclonal protein with kappa light chain specificity.  -LDH 169, CBC normal, CMP normal except for sodium 131 (L), Chloride 96 (L).   02/14/2021: Establish care with Dede Query PA-C 02/22/2021: bone marrow biopsy confirms the diagnosis of Multiple Myeloma with a monoclonal plasma cell population.  03/16/2021: Cycle 1 Day 1 of VRd chemotherapy  04/06/2021: Cycle 2 Day 1 of VRd chemotherapy  04/27/2021: Cycle 3 Day 1 of VRd chemotherapy   Interval History:  Samuel [name] 65 y.o. male with medical history significant for IgG Kappa multiple myeloma who presents for a follow up visit. The patient's last visit was on 04/13/2021. In the interim since the last visit he has continued on VRd chemotherapy.   On exam today Samuel [name] is accompanied by his wife. He reports that he is been tolerating the chemotherapy quite well.  He notes the main issue that he has been having is with steroids.  He reports that he has difficulty sleeping the weekend after he received steroid therapy.  He notes that he can get up as early as 1 AM on Friday Saturday and Sunday.  He thinks that things start to improve starting on Monday.  He reports that he "just does not feel like himself".  He otherwise does endorse having some constipation that is managed with over-the-counter supplementation.  His energy levels are fluctuant, but he does make effort to exercise every day.  Otherwise he has no questions concerns or complaints today.  He is willing and able to proceed  with treatment at this time.  He has had no issues with fatigue, bleeding, bruising, or dark stools.  Denies any fevers, chills, sweats, nausea, vomiting or diarrhea.  A full 10 point ROS is listed below.  MEDICAL HISTORY:  Past Medical History:  Diagnosis Date   Asthma    High cholesterol    under control.    Perennial allergic rhinitis    Sciatic pain, right    Seasonal allergic rhinitis    Thyroid disease     SURGICAL HISTORY: Past Surgical History:  Procedure Laterality Date   NASAL SINUS SURGERY     NECK SURGERY  1996    SOCIAL HISTORY: Social History   Socioeconomic History   Marital status: Married    Spouse name: Not on file   Number of children: Not on file   Years of education: Not on file   Highest education level: Not on file  Occupational History   Not on file  Tobacco Use   Smoking status: Former    Packs/day: 0.10    Years: 5.00    Pack years: 0.50    Types: Cigarettes    Quit date: 10/21/1982    Years since quitting: 38.5   Smokeless tobacco: Never  Substance and Sexual Activity   Alcohol use: Yes    Comment: 1 drink daily   Drug use: No   Sexual activity: Not on file  Other Topics Concern   Not on file  Social History Narrative   Not on file   Social Determinants of Health  Financial Resource Strain: Not on file  Food Insecurity: Not on file  Transportation Needs: Not on file  Physical Activity: Not on file  Stress: Not on file  Social Connections: Not on file  Intimate Partner Violence: Not on file    FAMILY HISTORY: Family History  Problem Relation Age of Onset   Allergies Father    Allergies Mother    Hypertension Other    Heart disease Other    Breast cancer Paternal Grandmother     ALLERGIES:  is allergic to clarithromycin and penicillins.  MEDICATIONS:  Current Outpatient Medications  Medication Sig Dispense Refill   aspirin EC 81 MG tablet Take 81 mg by mouth daily. Swallow whole.     lenalidomide ([name]) 25 MG  capsule TAKE 1 CAPSULE DAILY FOR 14 DAYS ON, 7 DAYS OFF, REPEAT EVERY 21 DAYS Auth # [id number]  Date: 04/16/21 (Patient taking differently: TAKE 1 CAPSULE DAILY FOR 14 DAYS ON, 7 DAYS OFF, REPEAT EVERY 21 DAYS Auth # [id number]  Date: 04/16/21) 14 capsule 0   acyclovir (ZOVIRAX) 400 MG tablet Take 1 tablet (400 mg total) by mouth 2 (two) times daily. 60 tablet 3   allopurinol (ZYLOPRIM) 300 MG tablet Take 300 mg by mouth daily.     ALPRAZolam (XANAX) 1 MG tablet Take 1 mg by mouth 2 (two) times daily as needed for anxiety. (Patient not taking: Reported on 04/20/2021)     [place] as directed. (Patient not taking: Reported on 04/20/2021)     cholecalciferol (VITAMIN D) 1000 UNITS tablet Take 3,000 Units by mouth daily.     dexamethasone (DECADRON) 4 MG tablet Take 10 tablets (40 mg total) by mouth once a week. 40 tablet 3   EPINEPHrine (EPI-PEN) 0.3 mg/0.3 mL DEVI Inject 0.3 mg into the muscle once as needed. (Patient not taking: Reported on 04/20/2021)     ezetimibe-simvastatin (VYTORIN) 10-40 MG per tablet Take 1 tablet by mouth at bedtime.     levothyroxine (SYNTHROID) 150 MCG tablet Take 150 mcg by mouth every other day. Alternates with 175 mcg     levothyroxine (SYNTHROID) 175 MCG tablet Take 1 tablet by mouth every other day.     montelukast (SINGULAIR) 10 MG tablet Take 10 mg by mouth at bedtime.     Multiple Vitamin (MULTIVITAMIN) tablet Take 1 tablet by mouth daily.     omeprazole (PRILOSEC) 40 MG capsule Take 1 capsule by mouth daily.     ondansetron (ZOFRAN) 8 MG tablet Take 1 tablet (8 mg total) by mouth every 8 (eight) hours as needed for nausea or vomiting. (Patient not taking: Reported on 04/20/2021) 30 tablet 0   prochlorperazine (COMPAZINE) 10 MG tablet Take 1 tablet (10 mg total) by mouth every 6 (six) hours as needed for nausea or vomiting. (Patient not taking: Reported on 04/20/2021) 30 tablet 0   sildenafil (REVATIO) 20 MG tablet 2-5 tablets one hour prior to sexual  intimacy     tamsulosin (FLOMAX) 0.4 MG CAPS capsule 1 capsule (Patient not taking: Reported on 04/20/2021)     triamcinolone cream (KENALOG) 0.1 % Apply topically.     Zinc 50 MG TABS Take by mouth. (Patient not taking: Reported on 04/20/2021)     No current facility-administered medications for this visit.    REVIEW OF SYSTEMS:   Constitutional: ( - ) fevers, ( - )  chills , ( - ) night sweats Eyes: ( - ) blurriness of vision, ( - ) double vision, ( - )  watery eyes Ears, nose, mouth, throat, and face: ( - ) mucositis, ( - ) sore throat Respiratory: ( - ) cough, ( - ) dyspnea, ( - ) wheezes Cardiovascular: ( - ) palpitation, ( - ) chest discomfort, ( - ) lower extremity swelling Gastrointestinal:  ( - ) nausea, ( - ) heartburn, ( - ) change in bowel habits Skin: ( - ) abnormal skin rashes Lymphatics: ( - ) new lymphadenopathy, ( - ) easy bruising Neurological: ( - ) numbness, ( - ) tingling, ( - ) new weaknesses Behavioral/Psych: ( - ) mood change, ( - ) new changes  All other systems were reviewed with the patient and are negative.  PHYSICAL EXAMINATION: ECOG PERFORMANCE STATUS: 0 - Asymptomatic  Vitals:   04/27/21 1024  BP: 120/75  Pulse: (!) 59  Resp: 18  Temp: 98.3 F (36.8 C)  SpO2: 100%    Filed Weights   04/27/21 1024  Weight: 216 lb (98 kg)   GENERAL: well appearing middle aged Caucasian male alert, no distress and comfortable SKIN: skin color, texture, turgor are normal, no rashes or significant lesions EYES: conjunctiva are pink and non-injected, sclera clear LUNGS: clear to auscultation and percussion with normal breathing effort HEART: regular rate & rhythm and no murmurs and no lower extremity edema Musculoskeletal: no cyanosis of digits and no clubbing  PSYCH: alert & oriented x 3, fluent speech NEURO: no focal motor/sensory deficits  LABORATORY DATA:  I have reviewed the data as listed CBC Latest Ref Rng & Units 04/27/2021 04/20/2021 04/13/2021  WBC 4.0 - 10.5  K/uL 7.2 6.9 7.2  Hemoglobin 13.0 - 17.0 g/dL 12.3(L) 13.0 12.7(L)  Hematocrit 39.0 - 52.0 % 35.4(L) 36.8(L) 36.0(L)  Platelets 150 - 400 K/uL 193 197 178    CMP Latest Ref Rng & Units 04/27/2021 04/20/2021 04/13/2021  Glucose 70 - 99 mg/dL 99 92 106(H)  BUN 8 - 23 mg/dL _0 Creatinine 0.61 - 1.24 mg/dL 0.91 0.87 0.89  Sodium 135 - 145 mmol/L 130(L) 127(L) 128(L)  Potassium 3.5 - 5.1 mmol/L 4.6 4.4 4.3  Chloride 98 - 111 mmol/L 100 93(L) 98  CO2 22 - 32 mmol/L _1 Calcium 8.9 - 10.3 mg/dL 8.6(L) 8.4(L) 8.8(L)  Total Protein 6.5 - 8.1 g/dL 6.8 7.2 7.0  Total Bilirubin 0.3 - 1.2 mg/dL 0.4 0.7 0.3  Alkaline Phos 38 - 126 U/L 61 56 62  AST 15 - 41 U/L _2 ALT 0 - 44 U/L _3 Lab Results  Component Value Date   MPROTEIN 0.8 (H) 04/13/2021   MPROTEIN 1.3 (H) 03/16/2021   MPROTEIN 1.8 (H) 02/02/2021   Lab Results  Component Value Date   KPAFRELGTCHN 31.5 (H) 04/13/2021   KPAFRELGTCHN 41.8 (H) 03/16/2021   KPAFRELGTCHN 45.1 (H) 02/14/2021   LAMBDASER 12.8 04/13/2021   LAMBDASER 13.7 03/16/2021   LAMBDASER 13.2 02/14/2021   KAPLAMBRATIO 2.46 (H) 04/13/2021   KAPLAMBRATIO 3.05 (H) 03/16/2021   KAPLAMBRATIO 1.36 (L) 02/20/2021    RADIOGRAPHIC STUDIES: I have personally reviewed the radiological images as listed and agreed with the findings in the report: lytic lesions in the hip bones bilaterally.  VAS [place] LOWER EXTREMITY VENOUS (DVT)  Result Date: 04/21/2021  Lower Venous DVT Study Patient Name:  Samuel [name]  Date of Exam:   04/20/2021 Medical Rec #: [id number]        Accession #:    [id number] Date of Birth: 12-27-1955  Patient Gender: M Patient Age:   20Y Exam Location:  [facility] Procedure:      VAS [place] LOWER EXTREMITY VENOUS (DVT) Referring Phys: [id number] Madie Reno Elizeo Rodriques IV --------------------------------------------------------------------------------  Indications: Pain, and Swelling.  Risk Factors: Cancer. Comparison Study: No prior studies.  Performing Technologist: Oliver Hum RVT  Examination Guidelines: A complete evaluation includes B-mode imaging, spectral Doppler, color Doppler, and power Doppler as needed of all accessible portions of each vessel. Bilateral testing is considered an integral part of a complete examination. Limited examinations for reoccurring indications may be performed as noted. The reflux portion of the exam is performed with the patient in reverse Trendelenburg.  +---------+---------------+---------+-----------+----------+--------------+ RIGHT    CompressibilityPhasicitySpontaneityPropertiesThrombus Aging +---------+---------------+---------+-----------+----------+--------------+ CFV      Full           Yes      Yes                                 +---------+---------------+---------+-----------+----------+--------------+ SFJ      Full                                                        +---------+---------------+---------+-----------+----------+--------------+ FV Prox  Full                                                        +---------+---------------+---------+-----------+----------+--------------+ FV Mid   Full                                                        +---------+---------------+---------+-----------+----------+--------------+ FV DistalFull                                                        +---------+---------------+---------+-----------+----------+--------------+ PFV      Full                                                        +---------+---------------+---------+-----------+----------+--------------+ POP      Full           Yes      Yes                                 +---------+---------------+---------+-----------+----------+--------------+ PTV      Full                                                        +---------+---------------+---------+-----------+----------+--------------+  PERO     Full                                                         +---------+---------------+---------+-----------+----------+--------------+   +---------+---------------+---------+-----------+----------+--------------+ LEFT     CompressibilityPhasicitySpontaneityPropertiesThrombus Aging +---------+---------------+---------+-----------+----------+--------------+ CFV      Full           Yes      Yes                                 +---------+---------------+---------+-----------+----------+--------------+ SFJ      Full                                                        +---------+---------------+---------+-----------+----------+--------------+ FV Prox  Full                                                        +---------+---------------+---------+-----------+----------+--------------+ FV Mid   Full                                                        +---------+---------------+---------+-----------+----------+--------------+ FV DistalFull                                                        +---------+---------------+---------+-----------+----------+--------------+ PFV      Full                                                        +---------+---------------+---------+-----------+----------+--------------+ POP      Full           Yes      Yes                                 +---------+---------------+---------+-----------+----------+--------------+ PTV      Full                                                        +---------+---------------+---------+-----------+----------+--------------+ PERO     Full                                                        +---------+---------------+---------+-----------+----------+--------------+  Summary: RIGHT: - There is no evidence of deep vein thrombosis in the lower extremity.  - No cystic structure found in the popliteal fossa.  LEFT: - There is no evidence of deep vein thrombosis in the lower extremity.  - No cystic structure  found in the popliteal fossa.  *See table(s) above for measurements and observations. Electronically signed by Ruta Hinds MD on 04/21/2021 at 9:23:46 AM.    Final      ASSESSMENT & PLAN Samuel [name] 65 y.o. male with medical history significant for IgG Kappa multiple myeloma who presents for a follow up visit.   After review of the labs, review of the records, and discussion with the patient the findings are most consistent with an IgG kappa multiple myeloma.  The patient has 2 lytic lesions within the pelvic bones (more noted in spine on CT scan) as well as 10% plasma cells within the bone marrow biopsy.  This combined with his serological findings confirm the diagnosis of multiple myeloma.  The treatment of choice for this patient's multiple myeloma is VRd. This will consist of bortezomib 1.15m/m2 on days 1, 8, 15, dexamethasone 435mon days 1,8, and 15, and revlimid 2522mO daily days 1-14. Cycles will consist of 21 days. We will use this regimen to stabilize the patient's myeloma, then make a referral to a BMT center of his choosing for consideration of a bone marrow transplant once he has been noted to have a good response (VGPR or better). Zometa was started after dental clearance was obtained. This will be continued x 2 years.   R-IPSS score: Stage 2. PFS of 42 months  # IgG Kappa Multiple Myeloma (t11;14, standard risk)  --diagnostic criteria was met with monoclonal plasma cells in the bone marrow and lytic lesions on the bone --recommend proceeding with VRd chemotherapy as noted above --patient is young and healthy enough for consideration of BMT, though he is borderline with his age. Will consider referral pending response to treatment.  -- Cycle 1 Day 1 started on 03/16/2021 --today is Cycle 3 Day 1 --RTC in 2 weeks for Cycle 3 Day 15 of treatment  #Supportive Care -- chemotherapy education complete --zometa therapy started on 04/05/2021 (4 mg IV q 3 months), next dose Sept  2022 --ASA 67m37m daily for thromboprophylaxis on revlimid -- zofran 8mg 66m PRN and compazine 10mg 59m6H for nausea -- acyclovir 400mg P66mD for VCZ prophylaxis -- no pain medication required at this time.   No orders of the defined types were placed in this encounter.  All questions were answered. The patient knows to call the clinic with any problems, questions or concerns.  A total of more than 30 minutes were spent on this encounter with face-to-face time and non-face-to-face time, including preparing to see the patient, ordering tests and/or medications, counseling the patient and coordination of care as outlined above.   Bralee Feldt T.Ledell Peoplespartment of Hematology/Oncology [facility] [phone] [email or web address]  04/27/2021 10:49 AM

## 2021-05-02 ENCOUNTER — Telehealth: Payer: Self-pay | Admitting: Hematology and Oncology

## 2021-05-02 NOTE — Telephone Encounter (Signed)
Scheduled per los. Called and spoke with patient. Confirmed all appts  °

## 2021-05-04 ENCOUNTER — Inpatient Hospital Stay: Payer: Managed Care, Other (non HMO)

## 2021-05-04 ENCOUNTER — Other Ambulatory Visit: Payer: Self-pay

## 2021-05-04 ENCOUNTER — Ambulatory Visit: Payer: Managed Care, Other (non HMO) | Admitting: Hematology and Oncology

## 2021-05-04 VITALS — BP 139/75 | HR 60 | Temp 98.1°F | Resp 18

## 2021-05-04 DIAGNOSIS — C9000 Multiple myeloma not having achieved remission: Secondary | ICD-10-CM

## 2021-05-04 DIAGNOSIS — Z5112 Encounter for antineoplastic immunotherapy: Secondary | ICD-10-CM | POA: Diagnosis not present

## 2021-05-04 LAB — CBC WITH DIFFERENTIAL (CANCER CENTER ONLY)
Abs Immature Granulocytes: 0.07 10*3/uL (ref 0.00–0.07)
Basophils Absolute: 0 10*3/uL (ref 0.0–0.1)
Basophils Relative: 0 %
Eosinophils Absolute: 0 10*3/uL (ref 0.0–0.5)
Eosinophils Relative: 0 %
HCT: 35.3 % — ABNORMAL LOW (ref 39.0–52.0)
Hemoglobin: 12.3 g/dL — ABNORMAL LOW (ref 13.0–17.0)
Immature Granulocytes: 1 %
Lymphocytes Relative: 5 %
Lymphs Abs: 0.4 10*3/uL — ABNORMAL LOW (ref 0.7–4.0)
MCH: 32.3 pg (ref 26.0–34.0)
MCHC: 34.8 g/dL (ref 30.0–36.0)
MCV: 92.7 fL (ref 80.0–100.0)
Monocytes Absolute: 0.2 10*3/uL (ref 0.1–1.0)
Monocytes Relative: 2 %
Neutro Abs: 7.6 10*3/uL (ref 1.7–7.7)
Neutrophils Relative %: 92 %
Platelet Count: 183 10*3/uL (ref 150–400)
RBC: 3.81 MIL/uL — ABNORMAL LOW (ref 4.22–5.81)
RDW: 14.3 % (ref 11.5–15.5)
WBC Count: 8.2 10*3/uL (ref 4.0–10.5)
nRBC: 0 % (ref 0.0–0.2)

## 2021-05-04 LAB — CMP (CANCER CENTER ONLY)
ALT: 19 U/L (ref 0–44)
AST: 22 U/L (ref 15–41)
Albumin: 3.4 g/dL — ABNORMAL LOW (ref 3.5–5.0)
Alkaline Phosphatase: 63 U/L (ref 38–126)
Anion gap: 4 — ABNORMAL LOW (ref 5–15)
BUN: 15 mg/dL (ref 8–23)
CO2: 28 mmol/L (ref 22–32)
Calcium: 8.8 mg/dL — ABNORMAL LOW (ref 8.9–10.3)
Chloride: 94 mmol/L — ABNORMAL LOW (ref 98–111)
Creatinine: 0.96 mg/dL (ref 0.61–1.24)
GFR, Estimated: >60 mL/min (ref >60)
Glucose, Bld: 91 mg/dL (ref 70–99)
Potassium: 4.3 mmol/L (ref 3.5–5.1)
Sodium: 126 mmol/L — ABNORMAL LOW (ref 135–145)
Total Bilirubin: 0.5 mg/dL (ref 0.3–1.2)
Total Protein: 6.9 g/dL (ref 6.5–8.1)

## 2021-05-04 LAB — LACTATE DEHYDROGENASE: LDH: 235 U/L — ABNORMAL HIGH (ref 98–192)

## 2021-05-04 MED ORDER — BORTEZOMIB CHEMO SQ INJECTION 3.5 MG (2.5MG/ML)
1.3000 mg/m2 | Freq: Once | INTRAMUSCULAR | Status: AC
  Administered 2021-05-04: 2.75 mg via SUBCUTANEOUS
  Filled 2021-05-04: qty 1.1

## 2021-05-09 ENCOUNTER — Other Ambulatory Visit: Payer: Self-pay | Admitting: *Deleted

## 2021-05-09 DIAGNOSIS — C9000 Multiple myeloma not having achieved remission: Secondary | ICD-10-CM

## 2021-05-09 MED ORDER — LENALIDOMIDE 25 MG PO CAPS: ORAL_CAPSULE | ORAL | 0 refills | Status: DC

## 2021-05-11 ENCOUNTER — Inpatient Hospital Stay (HOSPITAL_BASED_OUTPATIENT_CLINIC_OR_DEPARTMENT_OTHER): Payer: Managed Care, Other (non HMO) | Admitting: Hematology and Oncology

## 2021-05-11 ENCOUNTER — Other Ambulatory Visit: Payer: Self-pay

## 2021-05-11 ENCOUNTER — Inpatient Hospital Stay: Payer: Managed Care, Other (non HMO)

## 2021-05-11 ENCOUNTER — Ambulatory Visit: Payer: Managed Care, Other (non HMO) | Admitting: Hematology and Oncology

## 2021-05-11 ENCOUNTER — Other Ambulatory Visit: Payer: Managed Care, Other (non HMO)

## 2021-05-11 ENCOUNTER — Ambulatory Visit (HOSPITAL_COMMUNITY)
Admission: RE | Admit: 2021-05-11 | Discharge: 2021-05-11 | Disposition: A | Discharge status: Home / Self Care | Payer: Managed Care, Other (non HMO) | Source: Ambulatory Visit | Attending: Hematology and Oncology | Admitting: Hematology and Oncology

## 2021-05-11 VITALS — BP 140/79 | HR 60 | Temp 98.5°F | Resp 18 | Ht 69.5 in | Wt 215.5 lb

## 2021-05-11 DIAGNOSIS — M899 Disorder of bone, unspecified: Secondary | ICD-10-CM

## 2021-05-11 DIAGNOSIS — C9000 Multiple myeloma not having achieved remission: Secondary | ICD-10-CM

## 2021-05-11 DIAGNOSIS — D472 Monoclonal gammopathy: Secondary | ICD-10-CM

## 2021-05-11 DIAGNOSIS — Z5112 Encounter for antineoplastic immunotherapy: Secondary | ICD-10-CM | POA: Diagnosis not present

## 2021-05-11 LAB — CBC WITH DIFFERENTIAL (CANCER CENTER ONLY)
Abs Immature Granulocytes: 0.05 10*3/uL (ref 0.00–0.07)
Basophils Absolute: 0 10*3/uL (ref 0.0–0.1)
Basophils Relative: 0 %
Eosinophils Absolute: 0 10*3/uL (ref 0.0–0.5)
Eosinophils Relative: 0 %
HCT: 36.1 % — ABNORMAL LOW (ref 39.0–52.0)
Hemoglobin: 12.3 g/dL — ABNORMAL LOW (ref 13.0–17.0)
Immature Granulocytes: 1 %
Lymphocytes Relative: 4 %
Lymphs Abs: 0.3 10*3/uL — ABNORMAL LOW (ref 0.7–4.0)
MCH: 31.6 pg (ref 26.0–34.0)
MCHC: 34.1 g/dL (ref 30.0–36.0)
MCV: 92.8 fL (ref 80.0–100.0)
Monocytes Absolute: 0.1 10*3/uL (ref 0.1–1.0)
Monocytes Relative: 2 %
Neutro Abs: 6.4 10*3/uL (ref 1.7–7.7)
Neutrophils Relative %: 93 %
Platelet Count: 169 10*3/uL (ref 150–400)
RBC: 3.89 MIL/uL — ABNORMAL LOW (ref 4.22–5.81)
RDW: 14.3 % (ref 11.5–15.5)
WBC Count: 6.8 10*3/uL (ref 4.0–10.5)
nRBC: 0 % (ref 0.0–0.2)

## 2021-05-11 LAB — CMP (CANCER CENTER ONLY)
ALT: 20 U/L (ref 0–44)
AST: 21 U/L (ref 15–41)
Albumin: 3.4 g/dL — ABNORMAL LOW (ref 3.5–5.0)
Alkaline Phosphatase: 57 U/L (ref 38–126)
Anion gap: 9 (ref 5–15)
BUN: 14 mg/dL (ref 8–23)
CO2: 26 mmol/L (ref 22–32)
Calcium: 8.7 mg/dL — ABNORMAL LOW (ref 8.9–10.3)
Chloride: 94 mmol/L — ABNORMAL LOW (ref 98–111)
Creatinine: 0.98 mg/dL (ref 0.61–1.24)
GFR, Estimated: >60 mL/min (ref >60)
Glucose, Bld: 95 mg/dL (ref 70–99)
Potassium: 4.8 mmol/L (ref 3.5–5.1)
Sodium: 129 mmol/L — ABNORMAL LOW (ref 135–145)
Total Bilirubin: 0.5 mg/dL (ref 0.3–1.2)
Total Protein: 7 g/dL (ref 6.5–8.1)

## 2021-05-11 LAB — LACTATE DEHYDROGENASE: LDH: 246 U/L — ABNORMAL HIGH (ref 98–192)

## 2021-05-11 MED ORDER — BORTEZOMIB CHEMO SQ INJECTION 3.5 MG (2.5MG/ML)
1.3000 mg/m2 | Freq: Once | INTRAMUSCULAR | Status: AC
  Administered 2021-05-11: 2.75 mg via SUBCUTANEOUS
  Filled 2021-05-11: qty 1.1

## 2021-05-11 NOTE — Progress Notes (Signed)
[name]   [phone]  PROGRESS NOTE  Patient Care Team: Antony Contras, MD as PCP - General (Family Medicine)  Hematological/Oncological History # IgG Kappa Multiple Myeloma 02/02/2021:  Presented to [facility] ED due to right sided flank tenderness with bruising. CT abdomen/pelvis: Multiple small lytic lesions in the thoracolumbar spine and bilateral pelvis -SPEP: IgG 2,082 (H), M Protein 1.8 (H). IFE shows IgG monoclonal protein with kappa light chain specificity.  -LDH 169, CBC normal, CMP normal except for sodium 131 (L), Chloride 96 (L).   02/14/2021: Establish care with Dede Query PA-C 02/22/2021: bone marrow biopsy confirms the diagnosis of Multiple Myeloma with a monoclonal plasma cell population.  03/16/2021: Cycle 1 Day 1 of VRd chemotherapy  04/06/2021: Cycle 2 Day 1 of VRd chemotherapy  04/27/2021: Cycle 3 Day 1 of VRd chemotherapy   Interval History:  Samuel [name] 65 y.o. male with medical history significant for IgG Kappa multiple myeloma who presents for a follow up visit. The patient's last visit was on 04/27/2021. In the interim since the last visit he has continued on VRd chemotherapy.   On exam today Samuel [name] is accompanied by his wife. He reports that the main symptom he had this week was fatigue.  He notes that he did push himself pretty hard last Wednesday and having golf 9 holes of golf, done tai chi, and worked all day.  He notes that he is trying to drink plenty water and stay hydrated.  He is having some worsening back pain and notes that his sciatic pain is currently flaring up on the lower back side.  He notes he is not having any issues otherwise with the chemotherapy.  He has not been having any numbness or tingling.  He is willing and able to proceed with treatment at this time.  He has had no issues with bleeding, bruising, or dark stools.  Denies any fevers, chills, sweats, nausea, vomiting or diarrhea.  A full 10  point ROS is listed below.  MEDICAL HISTORY:  Past Medical History:  Diagnosis Date   Asthma    High cholesterol    under control.    Perennial allergic rhinitis    Sciatic pain, right    Seasonal allergic rhinitis    Thyroid disease     SURGICAL HISTORY: Past Surgical History:  Procedure Laterality Date   NASAL SINUS SURGERY     NECK SURGERY  1996    SOCIAL HISTORY: Social History   Socioeconomic History   Marital status: Married    Spouse name: Not on file   Number of children: Not on file   Years of education: Not on file   Highest education level: Not on file  Occupational History   Not on file  Tobacco Use   Smoking status: Former    Packs/day: 0.10    Years: 5.00    Pack years: 0.50    Types: Cigarettes    Quit date: 10/21/1982    Years since quitting: 38.5   Smokeless tobacco: Never  Substance and Sexual Activity   Alcohol use: Yes    Comment: 1 drink daily   Drug use: No   Sexual activity: Not on file  Other Topics Concern   Not on file  Social History Narrative   Not on file   Social Determinants of Health   Financial Resource Strain: Not on file  Food Insecurity: Not on file  Transportation Needs: Not on file  Physical Activity: Not  on file  Stress: Not on file  Social Connections: Not on file  Intimate Partner Violence: Not on file    FAMILY HISTORY: Family History  Problem Relation Age of Onset   Allergies Father    Allergies Mother    Hypertension Other    Heart disease Other    Breast cancer Paternal Grandmother     ALLERGIES:  is allergic to clarithromycin and penicillins.  MEDICATIONS:  Current Outpatient Medications  Medication Sig Dispense Refill   acyclovir (ZOVIRAX) 400 MG tablet Take 1 tablet (400 mg total) by mouth 2 (two) times daily. 60 tablet 3   allopurinol (ZYLOPRIM) 300 MG tablet Take 300 mg by mouth daily.     ALPRAZolam (XANAX) 1 MG tablet Take 1 mg by mouth 2 (two) times daily as needed for anxiety. (Patient  not taking: Reported on 04/20/2021)     aspirin EC 81 MG tablet Take 81 mg by mouth daily. Swallow whole.     ASSESS FULL RANGE PEAK METER DEVI as directed. (Patient not taking: Reported on 04/20/2021)     cholecalciferol (VITAMIN D) 1000 UNITS tablet Take 3,000 Units by mouth daily.     dexamethasone (DECADRON) 4 MG tablet Take 10 tablets (40 mg total) by mouth once a week. 40 tablet 3   EPINEPHrine (EPI-PEN) 0.3 mg/0.3 mL DEVI Inject 0.3 mg into the muscle once as needed. (Patient not taking: Reported on 04/20/2021)     ezetimibe-simvastatin (VYTORIN) 10-40 MG per tablet Take 1 tablet by mouth at bedtime.     lenalidomide ([name]) 25 MG capsule TAKE 1 CAPSULE DAILY FOR 14 DAYS ON, 7 DAYS OFF, REPEAT EVERY 21 DAYS Auth # [id number] Date: 05/09/21 14 capsule 0   levothyroxine (SYNTHROID) 150 MCG tablet Take 150 mcg by mouth every other day. Alternates with 175 mcg     levothyroxine (SYNTHROID) 175 MCG tablet Take 1 tablet by mouth every other day.     montelukast (SINGULAIR) 10 MG tablet Take 10 mg by mouth at bedtime.     Multiple Vitamin (MULTIVITAMIN) tablet Take 1 tablet by mouth daily.     omeprazole (PRILOSEC) 40 MG capsule Take 1 capsule by mouth daily.     ondansetron (ZOFRAN) 8 MG tablet Take 1 tablet (8 mg total) by mouth every 8 (eight) hours as needed for nausea or vomiting. (Patient not taking: Reported on 04/20/2021) 30 tablet 0   prochlorperazine (COMPAZINE) 10 MG tablet Take 1 tablet (10 mg total) by mouth every 6 (six) hours as needed for nausea or vomiting. (Patient not taking: Reported on 04/20/2021) 30 tablet 0   sildenafil (REVATIO) 20 MG tablet 2-5 tablets one hour prior to sexual intimacy     tamsulosin (FLOMAX) 0.4 MG CAPS capsule 1 capsule (Patient not taking: Reported on 04/20/2021)     triamcinolone cream (KENALOG) 0.1 % Apply topically.     Zinc 50 MG TABS Take by mouth. (Patient not taking: Reported on 04/20/2021)     No current facility-administered medications for this visit.     REVIEW OF SYSTEMS:   Constitutional: ( - ) fevers, ( - )  chills , ( - ) night sweats Eyes: ( - ) blurriness of vision, ( - ) double vision, ( - ) watery eyes Ears, nose, mouth, throat, and face: ( - ) mucositis, ( - ) sore throat Respiratory: ( - ) cough, ( - ) dyspnea, ( - ) wheezes Cardiovascular: ( - ) palpitation, ( - ) chest discomfort, ( - ) lower  extremity swelling Gastrointestinal:  ( - ) nausea, ( - ) heartburn, ( - ) change in bowel habits Skin: ( - ) abnormal skin rashes Lymphatics: ( - ) new lymphadenopathy, ( - ) easy bruising Neurological: ( - ) numbness, ( - ) tingling, ( - ) new weaknesses Behavioral/Psych: ( - ) mood change, ( - ) new changes  All other systems were reviewed with the patient and are negative.  PHYSICAL EXAMINATION: ECOG PERFORMANCE STATUS: 0 - Asymptomatic  Vitals:   05/11/21 1319  BP: 140/79  Pulse: 60  Resp: 18  Temp: 98.5 F (36.9 C)  SpO2: 100%    Filed Weights   05/11/21 1319  Weight: 215 lb 8 oz (97.8 kg)   GENERAL: well appearing middle aged Caucasian male alert, no distress and comfortable SKIN: skin color, texture, turgor are normal, no rashes or significant lesions EYES: conjunctiva are pink and non-injected, sclera clear LUNGS: clear to auscultation and percussion with normal breathing effort HEART: regular rate & rhythm and no murmurs and no lower extremity edema Musculoskeletal: no cyanosis of digits and no clubbing  PSYCH: alert & oriented x 3, fluent speech NEURO: no focal motor/sensory deficits  LABORATORY DATA:  I have reviewed the data as listed CBC Latest Ref Rng & Units 05/11/2021 05/04/2021 04/27/2021  WBC 4.0 - 10.5 K/uL 6.8 8.2 7.2  Hemoglobin 13.0 - 17.0 g/dL 12.3(L) 12.3(L) 12.3(L)  Hematocrit 39.0 - 52.0 % 36.1(L) 35.3(L) 35.4(L)  Platelets 150 - 400 K/uL 169 183 193    CMP Latest Ref Rng & Units 05/11/2021 05/04/2021 04/27/2021  Glucose 70 - 99 mg/dL 95 91 99  BUN 8 - 23 mg/dL _0 Creatinine 0.61 -  1.24 mg/dL 0.98 0.96 0.91  Sodium 135 - 145 mmol/L 129(L) 126(L) 130(L)  Potassium 3.5 - 5.1 mmol/L 4.8 4.3 4.6  Chloride 98 - 111 mmol/L 94(L) 94(L) 100  CO2 22 - 32 mmol/L _1 Calcium 8.9 - 10.3 mg/dL 8.7(L) 8.8(L) 8.6(L)  Total Protein 6.5 - 8.1 g/dL 7.0 6.9 6.8  Total Bilirubin 0.3 - 1.2 mg/dL 0.5 0.5 0.4  Alkaline Phos 38 - 126 U/L 57 63 61  AST 15 - 41 U/L _2 ALT 0 - 44 U/L _3 Lab Results  Component Value Date   MPROTEIN 0.8 (H) 04/13/2021   MPROTEIN 1.3 (H) 03/16/2021   MPROTEIN 1.8 (H) 02/02/2021   Lab Results  Component Value Date   KPAFRELGTCHN 31.5 (H) 04/13/2021   KPAFRELGTCHN 41.8 (H) 03/16/2021   KPAFRELGTCHN 45.1 (H) 02/14/2021   LAMBDASER 12.8 04/13/2021   LAMBDASER 13.7 03/16/2021   LAMBDASER 13.2 02/14/2021   KAPLAMBRATIO 2.46 (H) 04/13/2021   KAPLAMBRATIO 3.05 (H) 03/16/2021   KAPLAMBRATIO 1.36 (L) 02/20/2021    RADIOGRAPHIC STUDIES: I have personally reviewed the radiological images as listed and agreed with the findings in the report: lytic lesions in the hip bones bilaterally.  VAS [place] LOWER EXTREMITY VENOUS (DVT)  Result Date: 04/21/2021  Lower Venous DVT Study Patient Name:  Samuel [name]  Date of Exam:   04/20/2021 Medical Rec #: [id number]        Accession #:    [id number] Date of Birth: 1956/03/30        Patient Gender: M Patient Age:   64Y Exam Location:  [facility] Procedure:      VAS [place] LOWER EXTREMITY VENOUS (DVT) Referring Phys: [id number] Madie Reno Josuel Koeppen IV --------------------------------------------------------------------------------  Indications:  Pain, and Swelling.  Risk Factors: Cancer. Comparison Study: No prior studies. Performing Technologist: Oliver Hum RVT  Examination Guidelines: A complete evaluation includes B-mode imaging, spectral Doppler, color Doppler, and power Doppler as needed of all accessible portions of each vessel. Bilateral testing is considered an integral part of a complete examination.  Limited examinations for reoccurring indications may be performed as noted. The reflux portion of the exam is performed with the patient in reverse Trendelenburg.  +---------+---------------+---------+-----------+----------+--------------+ RIGHT    CompressibilityPhasicitySpontaneityPropertiesThrombus Aging +---------+---------------+---------+-----------+----------+--------------+ CFV      Full           Yes      Yes                                 +---------+---------------+---------+-----------+----------+--------------+ SFJ      Full                                                        +---------+---------------+---------+-----------+----------+--------------+ FV Prox  Full                                                        +---------+---------------+---------+-----------+----------+--------------+ FV Mid   Full                                                        +---------+---------------+---------+-----------+----------+--------------+ FV DistalFull                                                        +---------+---------------+---------+-----------+----------+--------------+ PFV      Full                                                        +---------+---------------+---------+-----------+----------+--------------+ POP      Full           Yes      Yes                                 +---------+---------------+---------+-----------+----------+--------------+ PTV      Full                                                        +---------+---------------+---------+-----------+----------+--------------+ PERO     Full                                                        +---------+---------------+---------+-----------+----------+--------------+   +---------+---------------+---------+-----------+----------+--------------+  LEFT     CompressibilityPhasicitySpontaneityPropertiesThrombus Aging  +---------+---------------+---------+-----------+----------+--------------+ CFV      Full           Yes      Yes                                 +---------+---------------+---------+-----------+----------+--------------+ SFJ      Full                                                        +---------+---------------+---------+-----------+----------+--------------+ FV Prox  Full                                                        +---------+---------------+---------+-----------+----------+--------------+ FV Mid   Full                                                        +---------+---------------+---------+-----------+----------+--------------+ FV DistalFull                                                        +---------+---------------+---------+-----------+----------+--------------+ PFV      Full                                                        +---------+---------------+---------+-----------+----------+--------------+ POP      Full           Yes      Yes                                 +---------+---------------+---------+-----------+----------+--------------+ PTV      Full                                                        +---------+---------------+---------+-----------+----------+--------------+ PERO     Full                                                        +---------+---------------+---------+-----------+----------+--------------+     Summary: RIGHT: - There is no evidence of deep vein thrombosis in the lower extremity.  - No cystic structure found in the popliteal fossa.  LEFT: - There is no evidence of deep vein thrombosis in the lower extremity.  - No  cystic structure found in the popliteal fossa.  *See table(s) above for measurements and observations. Electronically signed by Ruta Hinds MD on 04/21/2021 at 9:23:46 AM.    Final      ASSESSMENT & PLAN Samuel [name] 65 y.o. male with medical history significant for  IgG Kappa multiple myeloma who presents for a follow up visit.   After review of the labs, review of the records, and discussion with the patient the findings are most consistent with an IgG kappa multiple myeloma.  The patient has 2 lytic lesions within the pelvic bones (more noted in spine on CT scan) as well as 10% plasma cells within the bone marrow biopsy.  This combined with his serological findings confirm the diagnosis of multiple myeloma.  The treatment of choice for this patient's multiple myeloma is VRd. This will consist of bortezomib 1.67m/m2 on days 1, 8, 15, dexamethasone 46mon days 1,8, and 15, and revlimid 2562mO daily days 1-14. Cycles will consist of 21 days. We will use this regimen to stabilize the patient's myeloma, then make a referral to a BMT center of his choosing for consideration of a bone marrow transplant once he has been noted to have a good response (VGPR or better). Zometa was started after dental clearance was obtained. This will be continued x 2 years.   R-IPSS score: Stage 2. PFS of 42 months  # IgG Kappa Multiple Myeloma (t11;14, standard risk)  --diagnostic criteria was met with monoclonal plasma cells in the bone marrow and lytic lesions on the bone --recommend proceeding with VRd chemotherapy as noted above --patient is young and healthy enough for consideration of BMT, though he is borderline with his age. Will consider referral pending response to treatment.  -- Cycle 1 Day 1 started on 03/16/2021 --today is Cycle 3 Day 15 --RTC in 2 weeks for Cycle 4 Day 8 of treatment  #Supportive Care -- chemotherapy education complete --zometa therapy started on 04/05/2021 (4 mg IV q 3 months), next dose Sept 2022 --ASA 58m42m daily for thromboprophylaxis on revlimid -- zofran 8mg 77m PRN and compazine 10mg 25m6H for nausea -- acyclovir 400mg P67mD for VCZ prophylaxis -- no pain medication required at this time.   Orders Placed This Encounter  Procedures    DG Lumbar Spine 2-3 Views    Standing Status:   Future    Number of Occurrences:   1    Standing Expiration Date:   05/11/2022    Order Specific Question:   Reason for Exam (SYMPTOM  OR DIAGNOSIS REQUIRED)    Answer:   back pain, history of multiple myeloma    Order Specific Question:   [facility]    [facility]    Standing Status:   Future    Number of Occurrences:   1    Standing Expiration Date:   05/11/2022    Order Specific Question:   Reason for Exam (SYMPTOM  OR DIAGNOSIS REQUIRED)    Answer:   back pain, hx of multiple myeloma. concern for compression fracture    Order Specific Question:   Preferred imaging location?    Answer:   [facility] questions were answered. The patient knows to call the clinic with any problems, questions or concerns.  A total of more than 30 minutes were spent on this encounter with face-to-face time and non-face-to-face time, including preparing to  see the patient, ordering tests and/or medications, counseling the patient and coordination of care as outlined above.   Ledell Peoples, MD Department of Hematology/Oncology [place] [facility] Phone: [phone] Pager: [phone] Email: [email or web address]  05/11/2021 4:56 PM

## 2021-05-14 LAB — KAPPA/LAMBDA LIGHT CHAINS
Kappa free light chain: 29.4 mg/L — ABNORMAL HIGH (ref 3.3–19.4)
Kappa, lambda light chain ratio: 2 — ABNORMAL HIGH (ref 0.26–1.65)
Lambda free light chains: 14.7 mg/L (ref 5.7–26.3)

## 2021-05-16 ENCOUNTER — Telehealth: Payer: Self-pay | Admitting: *Deleted

## 2021-05-16 LAB — MULTIPLE MYELOMA PANEL, SERUM
Albumin SerPl Elph-Mcnc: 3.5 g/dL (ref 2.9–4.4)
Albumin/Glob SerPl: 1.3 (ref 0.7–1.7)
Alpha 1: 0.3 g/dL (ref 0.0–0.4)
Alpha2 Glob SerPl Elph-Mcnc: 0.8 g/dL (ref 0.4–1.0)
B-Globulin SerPl Elph-Mcnc: 0.8 g/dL (ref 0.7–1.3)
Gamma Glob SerPl Elph-Mcnc: 1 g/dL (ref 0.4–1.8)
Globulin, Total: 2.9 g/dL (ref 2.2–3.9)
IgA: 112 mg/dL (ref 61–437)
IgG (Immunoglobin G), Serum: 1037 mg/dL (ref 603–1613)
IgM (Immunoglobulin M), Srm: 65 mg/dL (ref 20–172)
M Protein SerPl Elph-Mcnc: 0.4 g/dL — ABNORMAL HIGH
Total Protein ELP: 6.4 g/dL (ref 6.0–8.5)

## 2021-05-16 NOTE — Telephone Encounter (Signed)
-----   Message from Orson Slick, MD sent at 05/13/2021  6:02 PM EDT ----- Please let Samuel [name] know that his X-rays showed no evidence of a spinal fracture to explain his symptoms.   ----- Message ----- From: Interface, Rad Results In Sent: 05/13/2021   4:05 PM EDT To: Orson Slick, MD

## 2021-05-16 NOTE — Telephone Encounter (Signed)
[name] patient regarding recent labs and xray of spine. Spoke with patient and informed that he does not have any fractures of his vertebrae, no new changes that could be identified on xray.  Reviewed labs and noted improvement in his sodium and a continued decline in his kappa/lambda light chains, which is what we want. Advised that his multiple myeloma panel results are not back yet Pt voiced understanding.

## 2021-05-18 ENCOUNTER — Other Ambulatory Visit: Payer: Self-pay | Admitting: Hematology and Oncology

## 2021-05-18 ENCOUNTER — Other Ambulatory Visit: Payer: Self-pay

## 2021-05-18 ENCOUNTER — Inpatient Hospital Stay: Payer: Managed Care, Other (non HMO)

## 2021-05-18 VITALS — BP 145/70 | HR 60 | Temp 97.8°F | Resp 16

## 2021-05-18 DIAGNOSIS — Z5112 Encounter for antineoplastic immunotherapy: Secondary | ICD-10-CM | POA: Diagnosis not present

## 2021-05-18 DIAGNOSIS — C9000 Multiple myeloma not having achieved remission: Secondary | ICD-10-CM

## 2021-05-18 LAB — LACTATE DEHYDROGENASE: LDH: 274 U/L — ABNORMAL HIGH (ref 98–192)

## 2021-05-18 LAB — CMP (CANCER CENTER ONLY)
ALT: 22 U/L (ref 0–44)
AST: 22 U/L (ref 15–41)
Albumin: 3.4 g/dL — ABNORMAL LOW (ref 3.5–5.0)
Alkaline Phosphatase: 61 U/L (ref 38–126)
Anion gap: 6 (ref 5–15)
BUN: 19 mg/dL (ref 8–23)
CO2: 24 mmol/L (ref 22–32)
Calcium: 8.7 mg/dL — ABNORMAL LOW (ref 8.9–10.3)
Chloride: 99 mmol/L (ref 98–111)
Creatinine: 0.93 mg/dL (ref 0.61–1.24)
GFR, Estimated: >60 mL/min (ref >60)
Glucose, Bld: 112 mg/dL — ABNORMAL HIGH (ref 70–99)
Potassium: 4.6 mmol/L (ref 3.5–5.1)
Sodium: 129 mmol/L — ABNORMAL LOW (ref 135–145)
Total Bilirubin: 0.3 mg/dL (ref 0.3–1.2)
Total Protein: 6.9 g/dL (ref 6.5–8.1)

## 2021-05-18 LAB — CBC WITH DIFFERENTIAL (CANCER CENTER ONLY)
Abs Immature Granulocytes: 0.04 10*3/uL (ref 0.00–0.07)
Basophils Absolute: 0 10*3/uL (ref 0.0–0.1)
Basophils Relative: 0 %
Eosinophils Absolute: 0.2 10*3/uL (ref 0.0–0.5)
Eosinophils Relative: 4 %
HCT: 36.3 % — ABNORMAL LOW (ref 39.0–52.0)
Hemoglobin: 12.3 g/dL — ABNORMAL LOW (ref 13.0–17.0)
Immature Granulocytes: 1 %
Lymphocytes Relative: 5 %
Lymphs Abs: 0.3 10*3/uL — ABNORMAL LOW (ref 0.7–4.0)
MCH: 31.5 pg (ref 26.0–34.0)
MCHC: 33.9 g/dL (ref 30.0–36.0)
MCV: 93.1 fL (ref 80.0–100.0)
Monocytes Absolute: 0.2 10*3/uL (ref 0.1–1.0)
Monocytes Relative: 3 %
Neutro Abs: 5 10*3/uL (ref 1.7–7.7)
Neutrophils Relative %: 87 %
Platelet Count: 190 10*3/uL (ref 150–400)
RBC: 3.9 MIL/uL — ABNORMAL LOW (ref 4.22–5.81)
RDW: 14.3 % (ref 11.5–15.5)
WBC Count: 5.8 10*3/uL (ref 4.0–10.5)
nRBC: 0 % (ref 0.0–0.2)

## 2021-05-18 MED ORDER — BORTEZOMIB CHEMO SQ INJECTION 3.5 MG (2.5MG/ML)
1.3000 mg/m2 | Freq: Once | INTRAMUSCULAR | Status: AC
  Administered 2021-05-18: 2.75 mg via SUBCUTANEOUS
  Filled 2021-05-18: qty 1.1

## 2021-05-18 NOTE — Patient Instructions (Signed)
Bortezomib injection What is this medication? BORTEZOMIB (bor TEZ oh mib) targets proteins in cancer cells and stops thecancer cells from growing. It treats multiple myeloma and mantle cell lymphoma. This medicine may be used for other purposes; ask your health care provider orpharmacist if you have questions. COMMON BRAND NAME(S): [name] What should I tell my care team before I take this medication? They need to know if you have any of these conditions: dehydration diabetes (high blood sugar) heart disease liver disease tingling of the fingers or toes or other nerve disorder an unusual or allergic reaction to bortezomib, mannitol, boron, other medicines, foods, dyes, or preservatives pregnant or trying to get pregnant breast-feeding How should I use this medication? This medicine is injected into a vein or under the skin. It is given by ahealth care provider in a hospital or clinic setting. Talk to your health care provider about the use of this medicine in children.Special care may be needed. Overdosage: If you think you have taken too much of this medicine contact apoison control center or emergency room at once. NOTE: This medicine is only for you. Do not share this medicine with others. What if I miss a dose? Keep appointments for follow-up doses. It is important not to miss your dose.Call your health care provider if you are unable to keep an appointment. What may interact with this medication? This medicine may interact with the following medications: ketoconazole rifampin This list may not describe all possible interactions. Give your health care provider a list of all the medicines, herbs, non-prescription drugs, or dietary supplements you use. Also tell them if you smoke, drink alcohol, or use illegaldrugs. Some items may interact with your medicine. What should I watch for while using this medication? Your condition will be monitored carefully while you are receiving  thismedicine. You may need blood work done while you are taking this medicine. You may get drowsy or dizzy. Do not drive, use machinery, or do anything that needs mental alertness until you know how this medicine affects you. Do not stand up or sit up quickly, especially if you are an older patient. Thisreduces the risk of dizzy or fainting spells This medicine may increase your risk of getting an infection. Call your health care provider for advice if you get a fever, chills, sore throat, or other symptoms of a cold or flu. Do not treat yourself. Try to avoid being aroundpeople who are sick. Check with your health care provider if you have severe diarrhea, nausea, and vomiting, or if you sweat a lot. The loss of too much body fluid may make itdangerous for you to take this medicine. Do not become pregnant while taking this medicine or for 7 months after stopping it. Women should inform their health care provider if they wish to become pregnant or think they might be pregnant. Men should not father a child while taking this medicine and for 4 months after stopping it. There is a potential for serious harm to an unborn child. Talk to your health care provider for more information. Do not breast-feed an infant while taking thismedicine or for 2 months after stopping it. This medicine may make it more difficult to get pregnant or father a child.Talk to your health care provider if you are concerned about your fertility. What side effects may I notice from receiving this medication? Side effects that you should report to your doctor or health care professionalas soon as possible: allergic reactions (skin rash; itching or hives; swelling   of the face, lips, or tongue) bleeding (bloody or black, tarry stools; red or dark brown urine; spitting up blood or brown material that looks like coffee grounds; red spots on the skin; unusual bruising or bleeding from the eye, gums, or nose) blurred vision or changes in  vision confusion constipation headache heart failure (trouble breathing; fast, irregular heartbeat; sudden weight gain; swelling of the ankles, feet, hands) infection (fever, chills, cough, sore throat, pain or trouble passing urine) lack or loss of appetite liver injury (dark yellow or brown urine; general ill feeling or flu-like symptoms; loss of appetite, right upper belly pain; yellowing of the eyes or skin) low blood pressure (dizziness; feeling faint or lightheaded, falls; unusually weak or tired) muscle cramps pain, redness, or irritation at site where injected pain, tingling, numbness in the hands or feet seizures trouble breathing unusual bruising or bleeding Side effects that usually do not require medical attention (report to yourdoctor or health care professional if they continue or are bothersome): diarrhea nausea stomach pain trouble sleeping vomiting This list may not describe all possible side effects. Call your doctor for medical advice about side effects. You may report side effects to FDA at1-800-FDA-1088. Where should I keep my medication? This medicine is given in a hospital or clinic. It will not be stored at home. NOTE: This sheet is a summary. It may not cover all possible information. If you have questions about this medicine, talk to your doctor, pharmacist, orhealth care provider.  2022 Elsevier/Gold Standard (2020-09-28 13:22:53)  

## 2021-05-25 ENCOUNTER — Inpatient Hospital Stay: Payer: Managed Care, Other (non HMO)

## 2021-05-25 ENCOUNTER — Other Ambulatory Visit: Payer: Self-pay

## 2021-05-25 ENCOUNTER — Inpatient Hospital Stay: Payer: Managed Care, Other (non HMO) | Attending: Hematology and Oncology | Admitting: Hematology and Oncology

## 2021-05-25 ENCOUNTER — Ambulatory Visit (HOSPITAL_COMMUNITY)
Admission: RE | Admit: 2021-05-25 | Discharge: 2021-05-25 | Disposition: A | Discharge status: Home / Self Care | Payer: Managed Care, Other (non HMO) | Source: Ambulatory Visit | Attending: Hematology and Oncology | Admitting: Hematology and Oncology

## 2021-05-25 VITALS — BP 132/70 | HR 65 | Temp 98.1°F | Resp 19 | Ht 69.5 in | Wt 216.3 lb

## 2021-05-25 DIAGNOSIS — C9000 Multiple myeloma not having achieved remission: Secondary | ICD-10-CM

## 2021-05-25 DIAGNOSIS — E7800 Pure hypercholesterolemia, unspecified: Secondary | ICD-10-CM | POA: Insufficient documentation

## 2021-05-25 DIAGNOSIS — Z5112 Encounter for antineoplastic immunotherapy: Secondary | ICD-10-CM | POA: Insufficient documentation

## 2021-05-25 DIAGNOSIS — Z87891 Personal history of nicotine dependence: Secondary | ICD-10-CM | POA: Diagnosis not present

## 2021-05-25 DIAGNOSIS — J45909 Unspecified asthma, uncomplicated: Secondary | ICD-10-CM | POA: Insufficient documentation

## 2021-05-25 DIAGNOSIS — Z7982 Long term (current) use of aspirin: Secondary | ICD-10-CM | POA: Insufficient documentation

## 2021-05-25 DIAGNOSIS — Z7951 Long term (current) use of inhaled steroids: Secondary | ICD-10-CM | POA: Diagnosis not present

## 2021-05-25 DIAGNOSIS — Z79899 Other long term (current) drug therapy: Secondary | ICD-10-CM | POA: Diagnosis not present

## 2021-05-25 DIAGNOSIS — Z7952 Long term (current) use of systemic steroids: Secondary | ICD-10-CM | POA: Insufficient documentation

## 2021-05-25 DIAGNOSIS — E079 Disorder of thyroid, unspecified: Secondary | ICD-10-CM | POA: Diagnosis not present

## 2021-05-25 LAB — CBC WITH DIFFERENTIAL (CANCER CENTER ONLY)
Abs Immature Granulocytes: 0.09 10*3/uL — ABNORMAL HIGH (ref 0.00–0.07)
Basophils Absolute: 0 10*3/uL (ref 0.0–0.1)
Basophils Relative: 0 %
Eosinophils Absolute: 0 10*3/uL (ref 0.0–0.5)
Eosinophils Relative: 0 %
HCT: 34.3 % — ABNORMAL LOW (ref 39.0–52.0)
Hemoglobin: 11.8 g/dL — ABNORMAL LOW (ref 13.0–17.0)
Immature Granulocytes: 1 %
Lymphocytes Relative: 4 %
Lymphs Abs: 0.3 10*3/uL — ABNORMAL LOW (ref 0.7–4.0)
MCH: 31.6 pg (ref 26.0–34.0)
MCHC: 34.4 g/dL (ref 30.0–36.0)
MCV: 91.7 fL (ref 80.0–100.0)
Monocytes Absolute: 0.1 10*3/uL (ref 0.1–1.0)
Monocytes Relative: 1 %
Neutro Abs: 8.6 10*3/uL — ABNORMAL HIGH (ref 1.7–7.7)
Neutrophils Relative %: 94 %
Platelet Count: 165 10*3/uL (ref 150–400)
RBC: 3.74 MIL/uL — ABNORMAL LOW (ref 4.22–5.81)
RDW: 14.6 % (ref 11.5–15.5)
WBC Count: 9.1 10*3/uL (ref 4.0–10.5)
nRBC: 0 % (ref 0.0–0.2)

## 2021-05-25 LAB — CMP (CANCER CENTER ONLY)
ALT: 19 U/L (ref 0–44)
AST: 19 U/L (ref 15–41)
Albumin: 3.2 g/dL — ABNORMAL LOW (ref 3.5–5.0)
Alkaline Phosphatase: 54 U/L (ref 38–126)
Anion gap: 7 (ref 5–15)
BUN: 14 mg/dL (ref 8–23)
CO2: 23 mmol/L (ref 22–32)
Calcium: 8.7 mg/dL — ABNORMAL LOW (ref 8.9–10.3)
Chloride: 97 mmol/L — ABNORMAL LOW (ref 98–111)
Creatinine: 0.9 mg/dL (ref 0.61–1.24)
GFR, Estimated: >60 mL/min (ref >60)
Glucose, Bld: 115 mg/dL — ABNORMAL HIGH (ref 70–99)
Potassium: 4.4 mmol/L (ref 3.5–5.1)
Sodium: 127 mmol/L — ABNORMAL LOW (ref 135–145)
Total Bilirubin: 0.4 mg/dL (ref 0.3–1.2)
Total Protein: 6.5 g/dL (ref 6.5–8.1)

## 2021-05-25 LAB — LACTATE DEHYDROGENASE: LDH: 269 U/L — ABNORMAL HIGH (ref 98–192)

## 2021-05-25 MED ORDER — BORTEZOMIB CHEMO SQ INJECTION 3.5 MG (2.5MG/ML)
1.3000 mg/m2 | Freq: Once | INTRAMUSCULAR | Status: AC
  Administered 2021-05-25: 2.75 mg via SUBCUTANEOUS
  Filled 2021-05-25: qty 1.1

## 2021-05-25 NOTE — Patient Instructions (Signed)
Bortezomib injection What is this medication? BORTEZOMIB (bor TEZ oh mib) targets proteins in cancer cells and stops thecancer cells from growing. It treats multiple myeloma and mantle cell lymphoma. This medicine may be used for other purposes; ask your health care provider orpharmacist if you have questions. COMMON BRAND NAME(S): [name] What should I tell my care team before I take this medication? They need to know if you have any of these conditions: dehydration diabetes (high blood sugar) heart disease liver disease tingling of the fingers or toes or other nerve disorder an unusual or allergic reaction to bortezomib, mannitol, boron, other medicines, foods, dyes, or preservatives pregnant or trying to get pregnant breast-feeding How should I use this medication? This medicine is injected into a vein or under the skin. It is given by ahealth care provider in a hospital or clinic setting. Talk to your health care provider about the use of this medicine in children.Special care may be needed. Overdosage: If you think you have taken too much of this medicine contact apoison control center or emergency room at once. NOTE: This medicine is only for you. Do not share this medicine with others. What if I miss a dose? Keep appointments for follow-up doses. It is important not to miss your dose.Call your health care provider if you are unable to keep an appointment. What may interact with this medication? This medicine may interact with the following medications: ketoconazole rifampin This list may not describe all possible interactions. Give your health care provider a list of all the medicines, herbs, non-prescription drugs, or dietary supplements you use. Also tell them if you smoke, drink alcohol, or use illegaldrugs. Some items may interact with your medicine. What should I watch for while using this medication? Your condition will be monitored carefully while you are receiving  thismedicine. You may need blood work done while you are taking this medicine. You may get drowsy or dizzy. Do not drive, use machinery, or do anything that needs mental alertness until you know how this medicine affects you. Do not stand up or sit up quickly, especially if you are an older patient. Thisreduces the risk of dizzy or fainting spells This medicine may increase your risk of getting an infection. Call your health care provider for advice if you get a fever, chills, sore throat, or other symptoms of a cold or flu. Do not treat yourself. Try to avoid being aroundpeople who are sick. Check with your health care provider if you have severe diarrhea, nausea, and vomiting, or if you sweat a lot. The loss of too much body fluid may make itdangerous for you to take this medicine. Do not become pregnant while taking this medicine or for 7 months after stopping it. Women should inform their health care provider if they wish to become pregnant or think they might be pregnant. Men should not father a child while taking this medicine and for 4 months after stopping it. There is a potential for serious harm to an unborn child. Talk to your health care provider for more information. Do not breast-feed an infant while taking thismedicine or for 2 months after stopping it. This medicine may make it more difficult to get pregnant or father a child.Talk to your health care provider if you are concerned about your fertility. What side effects may I notice from receiving this medication? Side effects that you should report to your doctor or health care professionalas soon as possible: allergic reactions (skin rash; itching or hives; swelling   of the face, lips, or tongue) bleeding (bloody or black, tarry stools; red or dark brown urine; spitting up blood or brown material that looks like coffee grounds; red spots on the skin; unusual bruising or bleeding from the eye, gums, or nose) blurred vision or changes in  vision confusion constipation headache heart failure (trouble breathing; fast, irregular heartbeat; sudden weight gain; swelling of the ankles, feet, hands) infection (fever, chills, cough, sore throat, pain or trouble passing urine) lack or loss of appetite liver injury (dark yellow or brown urine; general ill feeling or flu-like symptoms; loss of appetite, right upper belly pain; yellowing of the eyes or skin) low blood pressure (dizziness; feeling faint or lightheaded, falls; unusually weak or tired) muscle cramps pain, redness, or irritation at site where injected pain, tingling, numbness in the hands or feet seizures trouble breathing unusual bruising or bleeding Side effects that usually do not require medical attention (report to yourdoctor or health care professional if they continue or are bothersome): diarrhea nausea stomach pain trouble sleeping vomiting This list may not describe all possible side effects. Call your doctor for medical advice about side effects. You may report side effects to FDA at1-800-FDA-1088. Where should I keep my medication? This medicine is given in a hospital or clinic. It will not be stored at home. NOTE: This sheet is a summary. It may not cover all possible information. If you have questions about this medicine, talk to your doctor, pharmacist, orhealth care provider.  2022 Elsevier/Gold Standard (2020-09-28 13:22:53)  

## 2021-05-28 ENCOUNTER — Other Ambulatory Visit: Payer: Self-pay | Admitting: Hematology and Oncology

## 2021-05-28 ENCOUNTER — Telehealth: Payer: Self-pay | Admitting: *Deleted

## 2021-05-28 ENCOUNTER — Other Ambulatory Visit: Payer: Self-pay | Admitting: *Deleted

## 2021-05-28 DIAGNOSIS — C9000 Multiple myeloma not having achieved remission: Secondary | ICD-10-CM

## 2021-05-28 MED ORDER — FUROSEMIDE 20 MG PO TABS: 20.0000 mg | ORAL_TABLET | Freq: Every day | ORAL | 1 refills | Status: DC

## 2021-05-28 MED ORDER — ALBUTEROL SULFATE HFA 108 (90 BASE) MCG/ACT IN AERS: 2.0000 | INHALATION_SPRAY | Freq: Four times a day (QID) | RESPIRATORY_TRACT | 1 refills | Status: DC | PRN

## 2021-05-28 NOTE — Telephone Encounter (Signed)
Received call from patient. He states he has continued to feel SOB over the weekend and he c/o a sore area on his right calf area. No swelling or redness noted.  Pt had chest xray done on Friday.   Spoke with Dr. [name]. He recommended that he will cut his Dexamethasone in half next treatment (06/01/21 and then start him on Lasix 20 mg daily for the next 2 weeks, as pt's CXR showed some evidence of pulmonary edema. Advised pt of the above. Pt takes his Dexamethasone at home prior to his treatment. He has 4 mg tablets. Instructed to take only 5 tablets not the usual 10 on 06/01/21. Instructed to take the Lasix daily in the morning. Also ordered the Albuterol inhaler for him to use every 6 hours as needed for SOB. Instructed pt to call with any questions or problems. Clair Gulling voiced understanding.  Medications escribed to [facility]

## 2021-05-29 ENCOUNTER — Other Ambulatory Visit: Payer: Self-pay | Admitting: *Deleted

## 2021-05-29 DIAGNOSIS — C9000 Multiple myeloma not having achieved remission: Secondary | ICD-10-CM

## 2021-05-29 MED ORDER — LENALIDOMIDE 25 MG PO CAPS: ORAL_CAPSULE | ORAL | 0 refills | Status: DC

## 2021-06-01 ENCOUNTER — Inpatient Hospital Stay: Payer: Managed Care, Other (non HMO)

## 2021-06-01 ENCOUNTER — Telehealth: Payer: Self-pay | Admitting: Family Medicine

## 2021-06-01 ENCOUNTER — Inpatient Hospital Stay (HOSPITAL_BASED_OUTPATIENT_CLINIC_OR_DEPARTMENT_OTHER): Payer: Managed Care, Other (non HMO) | Admitting: Hematology and Oncology

## 2021-06-01 ENCOUNTER — Other Ambulatory Visit: Payer: Managed Care, Other (non HMO)

## 2021-06-01 ENCOUNTER — Ambulatory Visit: Payer: Managed Care, Other (non HMO)

## 2021-06-01 ENCOUNTER — Other Ambulatory Visit: Payer: Self-pay

## 2021-06-01 ENCOUNTER — Encounter: Payer: Self-pay | Admitting: Hematology and Oncology

## 2021-06-01 VITALS — BP 117/68 | HR 61 | Temp 98.4°F | Resp 18 | Ht 69.5 in | Wt 214.9 lb

## 2021-06-01 DIAGNOSIS — M899 Disorder of bone, unspecified: Secondary | ICD-10-CM

## 2021-06-01 DIAGNOSIS — C9000 Multiple myeloma not having achieved remission: Secondary | ICD-10-CM

## 2021-06-01 DIAGNOSIS — Z5112 Encounter for antineoplastic immunotherapy: Secondary | ICD-10-CM | POA: Diagnosis not present

## 2021-06-01 DIAGNOSIS — R0602 Shortness of breath: Secondary | ICD-10-CM | POA: Diagnosis not present

## 2021-06-01 LAB — CMP (CANCER CENTER ONLY)
ALT: 21 U/L (ref 0–44)
AST: 16 U/L (ref 15–41)
Albumin: 2.9 g/dL — ABNORMAL LOW (ref 3.5–5.0)
Alkaline Phosphatase: 53 U/L (ref 38–126)
Anion gap: 8 (ref 5–15)
BUN: 16 mg/dL (ref 8–23)
CO2: 22 mmol/L (ref 22–32)
Calcium: 8.4 mg/dL — ABNORMAL LOW (ref 8.9–10.3)
Chloride: 100 mmol/L (ref 98–111)
Creatinine: 0.96 mg/dL (ref 0.61–1.24)
GFR, Estimated: >60 mL/min (ref >60)
Glucose, Bld: 112 mg/dL — ABNORMAL HIGH (ref 70–99)
Potassium: 4.2 mmol/L (ref 3.5–5.1)
Sodium: 130 mmol/L — ABNORMAL LOW (ref 135–145)
Total Bilirubin: 0.5 mg/dL (ref 0.3–1.2)
Total Protein: 6.3 g/dL — ABNORMAL LOW (ref 6.5–8.1)

## 2021-06-01 LAB — CBC WITH DIFFERENTIAL (CANCER CENTER ONLY)
Abs Immature Granulocytes: 0.09 10*3/uL — ABNORMAL HIGH (ref 0.00–0.07)
Basophils Absolute: 0 10*3/uL (ref 0.0–0.1)
Basophils Relative: 1 %
Eosinophils Absolute: 1.8 10*3/uL — ABNORMAL HIGH (ref 0.0–0.5)
Eosinophils Relative: 22 %
HCT: 35 % — ABNORMAL LOW (ref 39.0–52.0)
Hemoglobin: 12.3 g/dL — ABNORMAL LOW (ref 13.0–17.0)
Immature Granulocytes: 1 %
Lymphocytes Relative: 8 %
Lymphs Abs: 0.7 10*3/uL (ref 0.7–4.0)
MCH: 31.5 pg (ref 26.0–34.0)
MCHC: 35.1 g/dL (ref 30.0–36.0)
MCV: 89.7 fL (ref 80.0–100.0)
Monocytes Absolute: 0.2 10*3/uL (ref 0.1–1.0)
Monocytes Relative: 3 %
Neutro Abs: 5.4 10*3/uL (ref 1.7–7.7)
Neutrophils Relative %: 65 %
Platelet Count: 182 10*3/uL (ref 150–400)
RBC: 3.9 MIL/uL — ABNORMAL LOW (ref 4.22–5.81)
RDW: 14.4 % (ref 11.5–15.5)
WBC Count: 8.2 10*3/uL (ref 4.0–10.5)
nRBC: 0 % (ref 0.0–0.2)

## 2021-06-01 LAB — LACTATE DEHYDROGENASE: LDH: 327 U/L — ABNORMAL HIGH (ref 98–192)

## 2021-06-01 MED ORDER — BORTEZOMIB CHEMO SQ INJECTION 3.5 MG (2.5MG/ML)
1.3000 mg/m2 | Freq: Once | INTRAMUSCULAR | Status: AC
  Administered 2021-06-01: 2.75 mg via SUBCUTANEOUS
  Filled 2021-06-01: qty 1.1

## 2021-06-01 NOTE — Progress Notes (Signed)
[place] [phone]   [phone]  PROGRESS NOTE  Patient Care Team: Antony Contras, MD as PCP - General (Family Medicine)  Hematological/Oncological History # IgG Kappa Multiple Myeloma 02/02/2021:  Presented to [place] ED due to right sided flank tenderness with bruising. CT abdomen/pelvis: Multiple small lytic lesions in the thoracolumbar spine and bilateral pelvis -SPEP: IgG 2,082 (H), M Protein 1.8 (H). IFE shows IgG monoclonal protein with kappa light chain specificity.  -LDH 169, CBC normal, CMP normal except for sodium 131 (L), Chloride 96 (L).   02/14/2021: Establish care with Dede Query PA-C 02/22/2021: bone marrow biopsy confirms the diagnosis of Multiple Myeloma with a monoclonal plasma cell population.  03/16/2021: Cycle 1 Day 1 of VRd chemotherapy  04/06/2021: Cycle 2 Day 1 of VRd chemotherapy  04/27/2021: Cycle 3 Day 1 of VRd chemotherapy  05/18/2021: Cycle 4 Day 1 of VRd chemotherapy  06/01/2021: drop dexamethasone to 12m PO weekly and start lasix due to shortness of breath.   Interval History:  VChester SibertTosco 65y.o. male with medical history significant for IgG Kappa multiple myeloma who presents for a follow up visit. The patient's last visit was on 05/25/2021. In the interim since the last visit he has continued on VRd chemotherapy.   On exam today Mr. Rude is accompanied by his wife by phone. He has noticed very mild improvement since he started Lasix on Tuesday.  He has not had increased urination and notes that he continues to drink 8 to 10 glasses of water per day.  He is also been seen by his primary care provider who is giving him inhalers in order to help open up his airways.  He notes he would not exert himself this weekend and try to get his lung issues under better control.  He is not having any issues otherwise with the chemotherapy.  He has not been having any numbness or tingling.  He is willing and able to proceed with  treatment at this time.  He has had no issues with bleeding, bruising, or dark stools.  Denies any fevers, chills, sweats, nausea, vomiting or diarrhea.  A full 10 point ROS is listed below.  MEDICAL HISTORY:  Past Medical History:  Diagnosis Date   Asthma    High cholesterol    under control.    Perennial allergic rhinitis    Sciatic pain, right    Seasonal allergic rhinitis    Thyroid disease     SURGICAL HISTORY: Past Surgical History:  Procedure Laterality Date   NASAL SINUS SURGERY     NECK SURGERY  1996    SOCIAL HISTORY: Social History   Socioeconomic History   Marital status: Married    Spouse name: Not on file   Number of children: Not on file   Years of education: Not on file   Highest education level: Not on file  Occupational History   Not on file  Tobacco Use   Smoking status: Former    Packs/day: 0.10    Years: 5.00    Pack years: 0.50    Types: Cigarettes    Quit date: 10/21/1982    Years since quitting: 38.6   Smokeless tobacco: Never  Substance and Sexual Activity   Alcohol use: Yes    Comment: 1 drink daily   Drug use: No   Sexual activity: Not on file  Other Topics Concern   Not on file  Social History Narrative   Not on file  Social Determinants of Health   Financial Resource Strain: Not on file  Food Insecurity: Not on file  Transportation Needs: Not on file  Physical Activity: Not on file  Stress: Not on file  Social Connections: Not on file  Intimate Partner Violence: Not on file    FAMILY HISTORY: Family History  Problem Relation Age of Onset   Allergies Father    Allergies Mother    Hypertension Other    Heart disease Other    Breast cancer Paternal Grandmother     ALLERGIES:  is allergic to clarithromycin and penicillins.  MEDICATIONS:  Current Outpatient Medications  Medication Sig Dispense Refill   acyclovir (ZOVIRAX) 400 MG tablet Take 1 tablet (400 mg total) by mouth 2 (two) times daily. 60 tablet 3   albuterol  (VENTOLIN HFA) 108 (90 Base) MCG/ACT inhaler Inhale 2 puffs into the lungs every 6 (six) hours as needed for wheezing or shortness of breath. 8 g 1   allopurinol (ZYLOPRIM) 300 MG tablet Take 300 mg by mouth daily.     aspirin EC 81 MG tablet Take 81 mg by mouth daily. Swallow whole.     ASSESS FULL RANGE PEAK METER DEVI as directed.     cholecalciferol (VITAMIN D) 1000 UNITS tablet Take 3,000 Units by mouth daily.     dexamethasone (DECADRON) 4 MG tablet Take 10 tablets (40 mg total) by mouth once a week. 40 tablet 3   ezetimibe-simvastatin (VYTORIN) 10-40 MG per tablet Take 1 tablet by mouth at bedtime.     furosemide (LASIX) 20 MG tablet Take 1 tablet (20 mg total) by mouth daily. 14 tablet 1   lenalidomide ([name]) 25 MG capsule TAKE 1 CAPSULE DAILY FOR 14 DAYS ON, 7 DAYS OFF, REPEAT EVERY 21 DAYS [employer] auth # [id number]  Date : 05/29/21 14 capsule 0   levothyroxine (SYNTHROID) 150 MCG tablet Take 150 mcg by mouth every other day. Alternates with 175 mcg     levothyroxine (SYNTHROID) 175 MCG tablet Take 1 tablet by mouth every other day.     montelukast (SINGULAIR) 10 MG tablet Take 10 mg by mouth at bedtime.     Multiple Vitamin (MULTIVITAMIN) tablet Take 1 tablet by mouth daily.     omeprazole (PRILOSEC) 40 MG capsule Take 1 capsule by mouth daily.     sildenafil (REVATIO) 20 MG tablet 2-5 tablets one hour prior to sexual intimacy     tamsulosin (FLOMAX) 0.4 MG CAPS capsule      triamcinolone cream (KENALOG) 0.1 % Apply topically.     Zinc 50 MG TABS Take by mouth.     ALPRAZolam (XANAX) 1 MG tablet Take 1 mg by mouth 2 (two) times daily as needed for anxiety. (Patient not taking: No sig reported)     BREO ELLIPTA 200-25 MCG/INH AEPB Inhale 1 puff into the lungs daily.     EPINEPHrine (EPI-PEN) 0.3 mg/0.3 mL DEVI Inject 0.3 mg into the muscle once as needed. (Patient not taking: No sig reported)     ondansetron (ZOFRAN) 8 MG tablet Take 1 tablet (8 mg total) by mouth every 8 (eight) hours  as needed for nausea or vomiting. (Patient not taking: No sig reported) 30 tablet 0   prochlorperazine (COMPAZINE) 10 MG tablet Take 1 tablet (10 mg total) by mouth every 6 (six) hours as needed for nausea or vomiting. (Patient not taking: No sig reported) 30 tablet 0   No current facility-administered medications for this visit.    REVIEW OF  SYSTEMS:   Constitutional: ( - ) fevers, ( - )  chills , ( - ) night sweats Eyes: ( - ) blurriness of vision, ( - ) double vision, ( - ) watery eyes Ears, nose, mouth, throat, and face: ( - ) mucositis, ( - ) sore throat Respiratory: ( - ) cough, ( - ) dyspnea, ( - ) wheezes Cardiovascular: ( - ) palpitation, ( - ) chest discomfort, ( - ) lower extremity swelling Gastrointestinal:  ( - ) nausea, ( - ) heartburn, ( - ) change in bowel habits Skin: ( - ) abnormal skin rashes Lymphatics: ( - ) new lymphadenopathy, ( - ) easy bruising Neurological: ( - ) numbness, ( - ) tingling, ( - ) new weaknesses Behavioral/Psych: ( - ) mood change, ( - ) new changes  All other systems were reviewed with the patient and are negative.  PHYSICAL EXAMINATION: ECOG PERFORMANCE STATUS: 0 - Asymptomatic  Vitals:   06/01/21 0900  BP: 117/68  Pulse: 61  Resp: 18  Temp: 98.4 F (36.9 C)  SpO2: 96%    Filed Weights   06/01/21 0900  Weight: 214 lb 14.4 oz (97.5 kg)   GENERAL: well appearing middle aged Caucasian male alert, no distress and comfortable SKIN: skin color, texture, turgor are normal, no rashes or significant lesions EYES: conjunctiva are pink and non-injected, sclera clear LUNGS: Diffuse wheezes throughout all lung fields with no crackles. HEART: regular rate & rhythm and no murmurs and no lower extremity edema Musculoskeletal: no cyanosis of digits and no clubbing  PSYCH: alert & oriented x 3, fluent speech NEURO: no focal motor/sensory deficits  LABORATORY DATA:  I have reviewed the data as listed CBC Latest Ref Rng & Units 06/01/2021 05/25/2021  05/18/2021  WBC 4.0 - 10.5 K/uL 8.2 9.1 5.8  Hemoglobin 13.0 - 17.0 g/dL 12.3(L) 11.8(L) 12.3(L)  Hematocrit 39.0 - 52.0 % 35.0(L) 34.3(L) 36.3(L)  Platelets 150 - 400 K/uL 182 165 190    CMP Latest Ref Rng & Units 06/01/2021 05/25/2021 05/18/2021  Glucose 70 - 99 mg/dL 112(H) 115(H) 112(H)  BUN 8 - 23 mg/dL '16 14 19  ' Creatinine 0.61 - 1.24 mg/dL 0.96 0.90 0.93  Sodium 135 - 145 mmol/L 130(L) 127(L) 129(L)  Potassium 3.5 - 5.1 mmol/L 4.2 4.4 4.6  Chloride 98 - 111 mmol/L 100 97(L) 99  CO2 22 - 32 mmol/L '22 23 24  ' Calcium 8.9 - 10.3 mg/dL 8.4(L) 8.7(L) 8.7(L)  Total Protein 6.5 - 8.1 g/dL 6.3(L) 6.5 6.9  Total Bilirubin 0.3 - 1.2 mg/dL 0.5 0.4 0.3  Alkaline Phos 38 - 126 U/L 53 54 61  AST 15 - 41 U/L '16 19 22  ' ALT 0 - 44 U/L '21 19 22    ' Lab Results  Component Value Date   MPROTEIN 0.4 (H) 05/11/2021   MPROTEIN 0.8 (H) 04/13/2021   MPROTEIN 1.3 (H) 03/16/2021   Lab Results  Component Value Date   KPAFRELGTCHN 29.4 (H) 05/11/2021   KPAFRELGTCHN 31.5 (H) 04/13/2021   KPAFRELGTCHN 41.8 (H) 03/16/2021   LAMBDASER 14.7 05/11/2021   LAMBDASER 12.8 04/13/2021   LAMBDASER 13.7 03/16/2021   KAPLAMBRATIO 2.00 (H) 05/11/2021   KAPLAMBRATIO 2.46 (H) 04/13/2021   KAPLAMBRATIO 3.05 (H) 03/16/2021    RADIOGRAPHIC STUDIES: I have personally reviewed the radiological images as listed and agreed with the findings in the report: lytic lesions in the hip bones bilaterally.  DG Chest 2 View  Result Date: 05/27/2021 CLINICAL DATA:  Shortness of breath.  Multiple myeloma. EXAM: CHEST - 2 VIEW COMPARISON:  February 02, 2021 FINDINGS: The heart size is borderline to mildly enlarged. The hila and mediastinum are normal. No pneumothorax. Diffuse interstitial opacities in the lungs are identified, more pronounced in the interval. No focal infiltrate, nodule, or mass. Colonic interposition between the dome of the liver in the right diaphragm. No other abnormalities. IMPRESSION: Findings are most in keeping  with mild cardiomegaly and pulmonary edema. An atypical infection could have a similar appearance. Electronically Signed   By: Dorise Bullion III M.D   On: 05/27/2021 16:11   DG Thoracic Spine 2 View  Result Date: 05/13/2021 CLINICAL DATA:  back pain, history of multiple myeloma; back pain, hx of multiple myeloma. concern for compression fracture EXAM: LUMBAR SPINE - 2-3 VIEW; THORACIC SPINE 2 VIEWS COMPARISON:  February 02, 2021 FINDINGS: There are five non-rib bearing lumbar-type vertebral bodies and 12 rib-bearing thoracic type vertebral bodies. Revisualization of grade 1 anterolisthesis of L5-S1 secondary to bilateral pars defects. Known lytic lesions are better assessed on prior CT. There is no evidence for acute fracture or subluxation. Moderate intervertebral disc space height loss at L5-S1 and L3-4. Mild intervertebral disc space height loss at L1-2 and L4-5. Multilevel endplate proliferative changes throughout the thoracolumbar spine. Pelvic phleboliths. Nonobstructive bowel gas pattern. Unchanged cardiomediastinal silhouette. Vascular calcifications. IMPRESSION: No acute compression fracture deformity. Known lytic lesions are better assessed on prior CT. Electronically Signed   By: Valentino Saxon MD   On: 05/13/2021 16:03   DG Lumbar Spine 2-3 Views  Result Date: 05/13/2021 CLINICAL DATA:  back pain, history of multiple myeloma; back pain, hx of multiple myeloma. concern for compression fracture EXAM: LUMBAR SPINE - 2-3 VIEW; THORACIC SPINE 2 VIEWS COMPARISON:  February 02, 2021 FINDINGS: There are five non-rib bearing lumbar-type vertebral bodies and 12 rib-bearing thoracic type vertebral bodies. Revisualization of grade 1 anterolisthesis of L5-S1 secondary to bilateral pars defects. Known lytic lesions are better assessed on prior CT. There is no evidence for acute fracture or subluxation. Moderate intervertebral disc space height loss at L5-S1 and L3-4. Mild intervertebral disc space height loss  at L1-2 and L4-5. Multilevel endplate proliferative changes throughout the thoracolumbar spine. Pelvic phleboliths. Nonobstructive bowel gas pattern. Unchanged cardiomediastinal silhouette. Vascular calcifications. IMPRESSION: No acute compression fracture deformity. Known lytic lesions are better assessed on prior CT. Electronically Signed   By: Valentino Saxon MD   On: 05/13/2021 16:03     ASSESSMENT & PLAN Haskel Dewalt Rovner 65 y.o. male with medical history significant for IgG Kappa multiple myeloma who presents for a follow up visit.   After review of the labs, review of the records, and discussion with the patient the findings are most consistent with an IgG kappa multiple myeloma.  The patient has 2 lytic lesions within the pelvic bones (more noted in spine on CT scan) as well as 10% plasma cells within the bone marrow biopsy.  This combined with his serological findings confirm the diagnosis of multiple myeloma.  The treatment of choice for this patient's multiple myeloma is VRd. This will consist of bortezomib 1.25m/m2 on days 1, 8, 15, dexamethasone 466mon days 1,8, and 15, and revlimid 2513mO daily days 1-14. Cycles will consist of 21 days. We will use this regimen to stabilize the patient's myeloma, then make a referral to a BMT center of his choosing for consideration of a bone marrow transplant once he has been noted to have a good response (VGPR or better).  Zometa was started after dental clearance was obtained. This will be continued x 2 years.   R-IPSS score: Stage 2. PFS of 42 months  # IgG Kappa Multiple Myeloma (t11;14, standard risk)  --diagnostic criteria was met with monoclonal plasma cells in the bone marrow and lytic lesions on the bone --recommend proceeding with VRd chemotherapy as noted above --patient is young and healthy enough for consideration of BMT, though he is borderline with his age. Will consider referral pending response to treatment.  -- Cycle 1 Day 1 started  on 03/16/2021 --decreased dexamethasone to 22m PO weekly due to fluid overload. Also started lasix 236mPO (changed on 06/01/2021) --today is Cycle 4 Day 15 --RTC in 2 weeks for Cycle 5 Day 8 of treatment  #Shortness of Breath -- Chest x-ray findings consistent with fluid overload. --Patient also has diffuse wheezes consistent with known history of asthma --Recommend decreasing dexamethasone down to 20 mg p.o. weekly and starting Lasix 20 mg p.o. daily --Encouraged the patient to use inhalers provided to him by his primary care provider --May potentially be a side effect of the bortezomib.  We will monitor closely to determine what is the etiology of his current symptoms --Continue to monitor  #Supportive Care -- chemotherapy education complete --zometa therapy started on 04/05/2021 (4 mg IV q 3 months), next dose Sept 2022 --ASA 8182mO daily for thromboprophylaxis on revlimid -- zofran 8mg85mH PRN and compazine 10mg63mq6H for nausea -- acyclovir 400mg 14mID for VCZ prophylaxis -- no pain medication required at this time.   No orders of the defined types were placed in this encounter.   All questions were answered. The patient knows to call the clinic with any problems, questions or concerns.  A total of more than 30 minutes were spent on this encounter with face-to-face time and non-face-to-face time, including preparing to see the patient, ordering tests and/or medications, counseling the patient and coordination of care as outlined above.   Sadarius Norman TLedell Peoplesepartment of Hematology/Oncology [facility] [place]: [phone]: [phone]: [email or web address]  06/01/2021 9:55 AM

## 2021-06-01 NOTE — Patient Instructions (Signed)
Bortezomib injection What is this medication? BORTEZOMIB (bor TEZ oh mib) targets proteins in cancer cells and stops thecancer cells from growing. It treats multiple myeloma and mantle cell lymphoma. This medicine may be used for other purposes; ask your health care provider orpharmacist if you have questions. COMMON BRAND NAME(S): [name] What should I tell my care team before I take this medication? They need to know if you have any of these conditions: dehydration diabetes (high blood sugar) heart disease liver disease tingling of the fingers or toes or other nerve disorder an unusual or allergic reaction to bortezomib, mannitol, boron, other medicines, foods, dyes, or preservatives pregnant or trying to get pregnant breast-feeding How should I use this medication? This medicine is injected into a vein or under the skin. It is given by ahealth care provider in a hospital or clinic setting. Talk to your health care provider about the use of this medicine in children.Special care may be needed. Overdosage: If you think you have taken too much of this medicine contact apoison control center or emergency room at once. NOTE: This medicine is only for you. Do not share this medicine with others. What if I miss a dose? Keep appointments for follow-up doses. It is important not to miss your dose.Call your health care provider if you are unable to keep an appointment. What may interact with this medication? This medicine may interact with the following medications: ketoconazole rifampin This list may not describe all possible interactions. Give your health care provider a list of all the medicines, herbs, non-prescription drugs, or dietary supplements you use. Also tell them if you smoke, drink alcohol, or use illegaldrugs. Some items may interact with your medicine. What should I watch for while using this medication? Your condition will be monitored carefully while you are receiving  thismedicine. You may need blood work done while you are taking this medicine. You may get drowsy or dizzy. Do not drive, use machinery, or do anything that needs mental alertness until you know how this medicine affects you. Do not stand up or sit up quickly, especially if you are an older patient. Thisreduces the risk of dizzy or fainting spells This medicine may increase your risk of getting an infection. Call your health care provider for advice if you get a fever, chills, sore throat, or other symptoms of a cold or flu. Do not treat yourself. Try to avoid being aroundpeople who are sick. Check with your health care provider if you have severe diarrhea, nausea, and vomiting, or if you sweat a lot. The loss of too much body fluid may make itdangerous for you to take this medicine. Do not become pregnant while taking this medicine or for 7 months after stopping it. Women should inform their health care provider if they wish to become pregnant or think they might be pregnant. Men should not father a child while taking this medicine and for 4 months after stopping it. There is a potential for serious harm to an unborn child. Talk to your health care provider for more information. Do not breast-feed an infant while taking thismedicine or for 2 months after stopping it. This medicine may make it more difficult to get pregnant or father a child.Talk to your health care provider if you are concerned about your fertility. What side effects may I notice from receiving this medication? Side effects that you should report to your doctor or health care professionalas soon as possible: allergic reactions (skin rash; itching or hives; swelling   of the face, lips, or tongue) bleeding (bloody or black, tarry stools; red or dark brown urine; spitting up blood or brown material that looks like coffee grounds; red spots on the skin; unusual bruising or bleeding from the eye, gums, or nose) blurred vision or changes in  vision confusion constipation headache heart failure (trouble breathing; fast, irregular heartbeat; sudden weight gain; swelling of the ankles, feet, hands) infection (fever, chills, cough, sore throat, pain or trouble passing urine) lack or loss of appetite liver injury (dark yellow or brown urine; general ill feeling or flu-like symptoms; loss of appetite, right upper belly pain; yellowing of the eyes or skin) low blood pressure (dizziness; feeling faint or lightheaded, falls; unusually weak or tired) muscle cramps pain, redness, or irritation at site where injected pain, tingling, numbness in the hands or feet seizures trouble breathing unusual bruising or bleeding Side effects that usually do not require medical attention (report to yourdoctor or health care professional if they continue or are bothersome): diarrhea nausea stomach pain trouble sleeping vomiting This list may not describe all possible side effects. Call your doctor for medical advice about side effects. You may report side effects to FDA at1-800-FDA-1088. Where should I keep my medication? This medicine is given in a hospital or clinic. It will not be stored at home. NOTE: This sheet is a summary. It may not cover all possible information. If you have questions about this medicine, talk to your doctor, pharmacist, orhealth care provider.  2022 Elsevier/Gold Standard (2020-09-28 13:22:53)  

## 2021-06-01 NOTE — Telephone Encounter (Signed)
Updated per 8/12 Provider request, pt aware.

## 2021-06-03 ENCOUNTER — Other Ambulatory Visit: Payer: Self-pay | Admitting: Hematology and Oncology

## 2021-06-03 ENCOUNTER — Encounter: Payer: Self-pay | Admitting: Hematology and Oncology

## 2021-06-03 NOTE — Progress Notes (Signed)
[place] [phone]   [phone]  PROGRESS NOTE  Patient Care Team: Antony Contras, MD as PCP - General (Family Medicine)  Hematological/Oncological History # IgG Kappa Multiple Myeloma 02/02/2021:  Presented to [facility] ED due to right sided flank tenderness with bruising. CT abdomen/pelvis: Multiple small lytic lesions in the thoracolumbar spine and bilateral pelvis -SPEP: IgG 2,082 (H), M Protein 1.8 (H). IFE shows IgG monoclonal protein with kappa light chain specificity.  -LDH 169, CBC normal, CMP normal except for sodium 131 (L), Chloride 96 (L).   02/14/2021: Establish care with Dede Query PA-C 02/22/2021: bone marrow biopsy confirms the diagnosis of Multiple Myeloma with a monoclonal plasma cell population.  03/16/2021: Cycle 1 Day 1 of VRd chemotherapy  04/06/2021: Cycle 2 Day 1 of VRd chemotherapy  04/27/2021: Cycle 3 Day 1 of VRd chemotherapy  05/18/2021: Cycle 4 Day 1 of VRd chemotherapy   Interval History:  Samuel [name] 65 y.o. male with medical history significant for IgG Kappa multiple myeloma who presents for a follow up visit. The patient's last visit was on 05/11/2021. In the interim since the last visit he has continued on VRd chemotherapy.   On exam today Samuel [name] is accompanied by his wife. He reports that he continues to have fatigue but unfortunately is developing more shortness of breath.  He notes that when doing his evening walk he is able to go up the first day without difficulty but is not able to make it up the second hill without stopping to rest.  He notes that he does have history of asthma and is currently working with his PCP to see if medications there can help.  He notes that this cough is very similar to his asthma cough and is dry.  He does have a headache on occasion and take some Tylenol to help these dissipate.  He notes he is not having any issues otherwise with the chemotherapy.  He has not been having any  numbness or tingling.  He is willing and able to proceed with treatment at this time.  He has had no issues with bleeding, bruising, or dark stools.  Denies any fevers, chills, sweats, nausea, vomiting or diarrhea.  A full 10 point ROS is listed below.  MEDICAL HISTORY:  Past Medical History:  Diagnosis Date   Asthma    High cholesterol    under control.    Perennial allergic rhinitis    Sciatic pain, right    Seasonal allergic rhinitis    Thyroid disease     SURGICAL HISTORY: Past Surgical History:  Procedure Laterality Date   NASAL SINUS SURGERY     NECK SURGERY  1996    SOCIAL HISTORY: Social History   Socioeconomic History   Marital status: Married    Spouse name: Not on file   Number of children: Not on file   Years of education: Not on file   Highest education level: Not on file  Occupational History   Not on file  Tobacco Use   Smoking status: Former    Packs/day: 0.10    Years: 5.00    Pack years: 0.50    Types: Cigarettes    Quit date: 10/21/1982    Years since quitting: 38.6   Smokeless tobacco: Never  Substance and Sexual Activity   Alcohol use: Yes    Comment: 1 drink daily   Drug use: No   Sexual activity: Not on file  Other Topics Concern   Not  on file  Social History Narrative   Not on file   Social Determinants of Health   Financial Resource Strain: Not on file  Food Insecurity: Not on file  Transportation Needs: Not on file  Physical Activity: Not on file  Stress: Not on file  Social Connections: Not on file  Intimate Partner Violence: Not on file    FAMILY HISTORY: Family History  Problem Relation Age of Onset   Allergies Father    Allergies Mother    Hypertension Other    Heart disease Other    Breast cancer Paternal Grandmother     ALLERGIES:  is allergic to clarithromycin and penicillins.  MEDICATIONS:  Current Outpatient Medications  Medication Sig Dispense Refill   acyclovir (ZOVIRAX) 400 MG tablet Take 1 tablet (400 mg  total) by mouth 2 (two) times daily. 60 tablet 3   albuterol (VENTOLIN HFA) 108 (90 Base) MCG/ACT inhaler Inhale 2 puffs into the lungs every 6 (six) hours as needed for wheezing or shortness of breath. 8 g 1   allopurinol (ZYLOPRIM) 300 MG tablet Take 300 mg by mouth daily.     ALPRAZolam (XANAX) 1 MG tablet Take 1 mg by mouth 2 (two) times daily as needed for anxiety. (Patient not taking: No sig reported)     aspirin EC 81 MG tablet Take 81 mg by mouth daily. Swallow whole.     ASSESS FULL RANGE PEAK METER DEVI as directed.     BREO ELLIPTA 200-25 MCG/INH AEPB Inhale 1 puff into the lungs daily.     cholecalciferol (VITAMIN D) 1000 UNITS tablet Take 3,000 Units by mouth daily.     dexamethasone (DECADRON) 4 MG tablet Take 10 tablets (40 mg total) by mouth once a week. 40 tablet 3   EPINEPHrine (EPI-PEN) 0.3 mg/0.3 mL DEVI Inject 0.3 mg into the muscle once as needed. (Patient not taking: No sig reported)     ezetimibe-simvastatin (VYTORIN) 10-40 MG per tablet Take 1 tablet by mouth at bedtime.     furosemide (LASIX) 20 MG tablet Take 1 tablet (20 mg total) by mouth daily. 14 tablet 1   lenalidomide ([name]) 25 MG capsule TAKE 1 CAPSULE DAILY FOR 14 DAYS ON, 7 DAYS OFF, REPEAT EVERY 21 DAYS [employer] auth # [id number]  Date : 05/29/21 14 capsule 0   levothyroxine (SYNTHROID) 150 MCG tablet Take 150 mcg by mouth every other day. Alternates with 175 mcg     levothyroxine (SYNTHROID) 175 MCG tablet Take 1 tablet by mouth every other day.     montelukast (SINGULAIR) 10 MG tablet Take 10 mg by mouth at bedtime.     Multiple Vitamin (MULTIVITAMIN) tablet Take 1 tablet by mouth daily.     omeprazole (PRILOSEC) 40 MG capsule Take 1 capsule by mouth daily.     ondansetron (ZOFRAN) 8 MG tablet Take 1 tablet (8 mg total) by mouth every 8 (eight) hours as needed for nausea or vomiting. (Patient not taking: No sig reported) 30 tablet 0   prochlorperazine (COMPAZINE) 10 MG tablet Take 1 tablet (10 mg total) by  mouth every 6 (six) hours as needed for nausea or vomiting. (Patient not taking: No sig reported) 30 tablet 0   sildenafil (REVATIO) 20 MG tablet 2-5 tablets one hour prior to sexual intimacy     tamsulosin (FLOMAX) 0.4 MG CAPS capsule      triamcinolone cream (KENALOG) 0.1 % Apply topically.     Zinc 50 MG TABS Take by mouth.  No current facility-administered medications for this visit.    REVIEW OF SYSTEMS:   Constitutional: ( - ) fevers, ( - )  chills , ( - ) night sweats Eyes: ( - ) blurriness of vision, ( - ) double vision, ( - ) watery eyes Ears, nose, mouth, throat, and face: ( - ) mucositis, ( - ) sore throat Respiratory: ( - ) cough, ( - ) dyspnea, ( - ) wheezes Cardiovascular: ( - ) palpitation, ( - ) chest discomfort, ( - ) lower extremity swelling Gastrointestinal:  ( - ) nausea, ( - ) heartburn, ( - ) change in bowel habits Skin: ( - ) abnormal skin rashes Lymphatics: ( - ) new lymphadenopathy, ( - ) easy bruising Neurological: ( - ) numbness, ( - ) tingling, ( - ) new weaknesses Behavioral/Psych: ( - ) mood change, ( - ) new changes  All other systems were reviewed with the patient and are negative.  PHYSICAL EXAMINATION: ECOG PERFORMANCE STATUS: 0 - Asymptomatic  Vitals:   05/25/21 1121  BP: 132/70  Pulse: 65  Resp: 19  Temp: 98.1 F (36.7 C)  SpO2: 98%    Filed Weights   05/25/21 1121  Weight: 216 lb 4.8 oz (98.1 kg)   GENERAL: well appearing middle aged Caucasian male alert, no distress and comfortable SKIN: skin color, texture, turgor are normal, no rashes or significant lesions EYES: conjunctiva are pink and non-injected, sclera clear LUNGS: clear to auscultation and percussion with normal breathing effort HEART: regular rate & rhythm and no murmurs and no lower extremity edema Musculoskeletal: no cyanosis of digits and no clubbing  PSYCH: alert & oriented x 3, fluent speech NEURO: no focal motor/sensory deficits  LABORATORY DATA:  I have  reviewed the data as listed CBC Latest Ref Rng & Units 06/01/2021 05/25/2021 05/18/2021  WBC 4.0 - 10.5 K/uL 8.2 9.1 5.8  Hemoglobin 13.0 - 17.0 g/dL 12.3(L) 11.8(L) 12.3(L)  Hematocrit 39.0 - 52.0 % 35.0(L) 34.3(L) 36.3(L)  Platelets 150 - 400 K/uL 182 165 190    CMP Latest Ref Rng & Units 06/01/2021 05/25/2021 05/18/2021  Glucose 70 - 99 mg/dL 112(H) 115(H) 112(H)  BUN 8 - 23 mg/dL _0 Creatinine 0.61 - 1.24 mg/dL 0.96 0.90 0.93  Sodium 135 - 145 mmol/L 130(L) 127(L) 129(L)  Potassium 3.5 - 5.1 mmol/L 4.2 4.4 4.6  Chloride 98 - 111 mmol/L 100 97(L) 99  CO2 22 - 32 mmol/L _1 Calcium 8.9 - 10.3 mg/dL 8.4(L) 8.7(L) 8.7(L)  Total Protein 6.5 - 8.1 g/dL 6.3(L) 6.5 6.9  Total Bilirubin 0.3 - 1.2 mg/dL 0.5 0.4 0.3  Alkaline Phos 38 - 126 U/L 53 54 61  AST 15 - 41 U/L _2 ALT 0 - 44 U/L _3 Lab Results  Component Value Date   MPROTEIN 0.4 (H) 05/11/2021   MPROTEIN 0.8 (H) 04/13/2021   MPROTEIN 1.3 (H) 03/16/2021   Lab Results  Component Value Date   KPAFRELGTCHN 29.4 (H) 05/11/2021   KPAFRELGTCHN 31.5 (H) 04/13/2021   KPAFRELGTCHN 41.8 (H) 03/16/2021   LAMBDASER 14.7 05/11/2021   LAMBDASER 12.8 04/13/2021   LAMBDASER 13.7 03/16/2021   KAPLAMBRATIO 2.00 (H) 05/11/2021   KAPLAMBRATIO 2.46 (H) 04/13/2021   KAPLAMBRATIO 3.05 (H) 03/16/2021    RADIOGRAPHIC STUDIES: I have personally reviewed the radiological images as listed and agreed with the findings in the report: lytic lesions in the hip bones bilaterally.  DG Chest  2 View  Result Date: 05/27/2021 CLINICAL DATA:  Shortness of breath.  Multiple myeloma. EXAM: CHEST - 2 VIEW COMPARISON:  February 02, 2021 FINDINGS: The heart size is borderline to mildly enlarged. The hila and mediastinum are normal. No pneumothorax. Diffuse interstitial opacities in the lungs are identified, more pronounced in the interval. No focal infiltrate, nodule, or mass. Colonic interposition between the dome of the liver in the right  diaphragm. No other abnormalities. IMPRESSION: Findings are most in keeping with mild cardiomegaly and pulmonary edema. An atypical infection could have a similar appearance. Electronically Signed   By: Dorise Bullion III M.D   On: 05/27/2021 16:11   DG Thoracic Spine 2 View  Result Date: 05/13/2021 CLINICAL DATA:  back pain, history of multiple myeloma; back pain, hx of multiple myeloma. concern for compression fracture EXAM: LUMBAR SPINE - 2-3 VIEW; THORACIC SPINE 2 VIEWS COMPARISON:  February 02, 2021 FINDINGS: There are five non-rib bearing lumbar-type vertebral bodies and 12 rib-bearing thoracic type vertebral bodies. Revisualization of grade 1 anterolisthesis of L5-S1 secondary to bilateral pars defects. Known lytic lesions are better assessed on prior CT. There is no evidence for acute fracture or subluxation. Moderate intervertebral disc space height loss at L5-S1 and L3-4. Mild intervertebral disc space height loss at L1-2 and L4-5. Multilevel endplate proliferative changes throughout the thoracolumbar spine. Pelvic phleboliths. Nonobstructive bowel gas pattern. Unchanged cardiomediastinal silhouette. Vascular calcifications. IMPRESSION: No acute compression fracture deformity. Known lytic lesions are better assessed on prior CT. Electronically Signed   By: Valentino Saxon MD   On: 05/13/2021 16:03   DG Lumbar Spine 2-3 Views  Result Date: 05/13/2021 CLINICAL DATA:  back pain, history of multiple myeloma; back pain, hx of multiple myeloma. concern for compression fracture EXAM: LUMBAR SPINE - 2-3 VIEW; THORACIC SPINE 2 VIEWS COMPARISON:  February 02, 2021 FINDINGS: There are five non-rib bearing lumbar-type vertebral bodies and 12 rib-bearing thoracic type vertebral bodies. Revisualization of grade 1 anterolisthesis of L5-S1 secondary to bilateral pars defects. Known lytic lesions are better assessed on prior CT. There is no evidence for acute fracture or subluxation. Moderate intervertebral disc  space height loss at L5-S1 and L3-4. Mild intervertebral disc space height loss at L1-2 and L4-5. Multilevel endplate proliferative changes throughout the thoracolumbar spine. Pelvic phleboliths. Nonobstructive bowel gas pattern. Unchanged cardiomediastinal silhouette. Vascular calcifications. IMPRESSION: No acute compression fracture deformity. Known lytic lesions are better assessed on prior CT. Electronically Signed   By: Valentino Saxon MD   On: 05/13/2021 16:03     ASSESSMENT & PLAN Samuel [name] 66 y.o. male with medical history significant for IgG Kappa multiple myeloma who presents for a follow up visit.   After review of the labs, review of the records, and discussion with the patient the findings are most consistent with an IgG kappa multiple myeloma.  The patient has 2 lytic lesions within the pelvic bones (more noted in spine on CT scan) as well as 10% plasma cells within the bone marrow biopsy.  This combined with his serological findings confirm the diagnosis of multiple myeloma.  The treatment of choice for this patient's multiple myeloma is VRd. This will consist of bortezomib 1.71m/m2 on days 1, 8, 15, dexamethasone 48mon days 1,8, and 15, and revlimid 2553mO daily days 1-14. Cycles will consist of 21 days. We will use this regimen to stabilize the patient's myeloma, then make a referral to a BMT center of his choosing for consideration of a bone marrow transplant  once he has been noted to have a good response (VGPR or better). Zometa was started after dental clearance was obtained. This will be continued x 2 years.   R-IPSS score: Stage 2. PFS of 42 months  # IgG Kappa Multiple Myeloma (t11;14, standard risk)  --diagnostic criteria was met with monoclonal plasma cells in the bone marrow and lytic lesions on the bone --recommend proceeding with VRd chemotherapy as noted above --patient is young and healthy enough for consideration of BMT, though he is borderline with his age.  Will consider referral pending response to treatment.  -- Cycle 1 Day 1 started on 03/16/2021 --today is Cycle 4 Day 8 --RTC in next week to reassess shortness of breath  #Shortness of Breath -- We will order a chest x-ray today due to concern for fluid overload --Encourage patient to talk with his PCP for consideration of changing or increasing his asthma medications --The patient return to clinic in 1 week to reassess.  #Supportive Care -- chemotherapy education complete --zometa therapy started on 04/05/2021 (4 mg IV q 3 months), next dose Sept 2022 --ASA 90m PO daily for thromboprophylaxis on revlimid -- zofran 851mq8H PRN and compazine 1073mO q6H for nausea -- acyclovir 400m69m BID for VCZ prophylaxis -- no pain medication required at this time.   Orders Placed This Encounter  Procedures   DG Chest 2 View    Standing Status:   Future    Number of Occurrences:   1    Standing Expiration Date:   05/25/2022    Order Specific Question:   Reason for Exam (SYMPTOM  OR DIAGNOSIS REQUIRED)    Answer:   shortness of breath    Order Specific Question:   [facility]    [facility] The patient knows to call the clinic with any problems, questions or concerns.  A total of more than 30 minutes were spent on this encounter with face-to-face time and non-face-to-face time, including preparing to see the patient, ordering tests and/or medications, counseling the patient and coordination of care as outlined above.   JohnLedell Peoples Department of Hematology/Oncology [facility]: [phone]: [phone]: [email or web address]  06/03/2021 6:30 PM

## 2021-06-08 ENCOUNTER — Ambulatory Visit: Payer: Managed Care, Other (non HMO) | Admitting: Physician Assistant

## 2021-06-08 ENCOUNTER — Other Ambulatory Visit: Payer: Managed Care, Other (non HMO)

## 2021-06-08 ENCOUNTER — Inpatient Hospital Stay: Payer: Managed Care, Other (non HMO)

## 2021-06-08 ENCOUNTER — Other Ambulatory Visit: Payer: Self-pay

## 2021-06-08 VITALS — BP 147/81 | HR 66 | Temp 98.4°F | Resp 18 | Wt 211.8 lb

## 2021-06-08 DIAGNOSIS — C9000 Multiple myeloma not having achieved remission: Secondary | ICD-10-CM

## 2021-06-08 DIAGNOSIS — Z5112 Encounter for antineoplastic immunotherapy: Secondary | ICD-10-CM | POA: Diagnosis not present

## 2021-06-08 LAB — CBC WITH DIFFERENTIAL (CANCER CENTER ONLY)
Abs Immature Granulocytes: 0.1 10*3/uL — ABNORMAL HIGH (ref 0.00–0.07)
Basophils Absolute: 0 10*3/uL (ref 0.0–0.1)
Basophils Relative: 0 %
Eosinophils Absolute: 0 10*3/uL (ref 0.0–0.5)
Eosinophils Relative: 0 %
HCT: 37.2 % — ABNORMAL LOW (ref 39.0–52.0)
Hemoglobin: 12.8 g/dL — ABNORMAL LOW (ref 13.0–17.0)
Immature Granulocytes: 1 %
Lymphocytes Relative: 7 %
Lymphs Abs: 0.5 10*3/uL — ABNORMAL LOW (ref 0.7–4.0)
MCH: 31.3 pg (ref 26.0–34.0)
MCHC: 34.4 g/dL (ref 30.0–36.0)
MCV: 91 fL (ref 80.0–100.0)
Monocytes Absolute: 0.2 10*3/uL (ref 0.1–1.0)
Monocytes Relative: 3 %
Neutro Abs: 6.6 10*3/uL (ref 1.7–7.7)
Neutrophils Relative %: 89 %
Platelet Count: 233 10*3/uL (ref 150–400)
RBC: 4.09 MIL/uL — ABNORMAL LOW (ref 4.22–5.81)
RDW: 14.7 % (ref 11.5–15.5)
WBC Count: 7.5 10*3/uL (ref 4.0–10.5)
nRBC: 0 % (ref 0.0–0.2)

## 2021-06-08 LAB — CMP (CANCER CENTER ONLY)
ALT: 22 U/L (ref 0–44)
AST: 21 U/L (ref 15–41)
Albumin: 3.4 g/dL — ABNORMAL LOW (ref 3.5–5.0)
Alkaline Phosphatase: 63 U/L (ref 38–126)
Anion gap: 10 (ref 5–15)
BUN: 17 mg/dL (ref 8–23)
CO2: 22 mmol/L (ref 22–32)
Calcium: 8.8 mg/dL — ABNORMAL LOW (ref 8.9–10.3)
Chloride: 97 mmol/L — ABNORMAL LOW (ref 98–111)
Creatinine: 1.05 mg/dL (ref 0.61–1.24)
GFR, Estimated: >60 mL/min (ref >60)
Glucose, Bld: 109 mg/dL — ABNORMAL HIGH (ref 70–99)
Potassium: 4.7 mmol/L (ref 3.5–5.1)
Sodium: 129 mmol/L — ABNORMAL LOW (ref 135–145)
Total Bilirubin: 0.3 mg/dL (ref 0.3–1.2)
Total Protein: 7.1 g/dL (ref 6.5–8.1)

## 2021-06-08 LAB — LACTATE DEHYDROGENASE: LDH: 394 U/L — ABNORMAL HIGH (ref 98–192)

## 2021-06-08 MED ORDER — BORTEZOMIB CHEMO SQ INJECTION 3.5 MG (2.5MG/ML)
1.3000 mg/m2 | Freq: Once | INTRAMUSCULAR | Status: AC
  Administered 2021-06-08: 2.75 mg via SUBCUTANEOUS
  Filled 2021-06-08: qty 1.1

## 2021-06-08 NOTE — Progress Notes (Signed)
Pt stated he took '20mg'$  of Dexamethasone prior to his arrival to [facility] today (around 1200pm)

## 2021-06-08 NOTE — Patient Instructions (Signed)
[facility]  Discharge Instructions: Thank you for choosing [facility] to provide your oncology and hematology care.   If you have a lab appointment with the [facility], please go directly to the [facility] and check in at the registration area.   Wear comfortable clothing and clothing appropriate for easy access to any Portacath or PICC line.   We strive to give you quality time with your provider. You may need to reschedule your appointment if you arrive late (15 or more minutes).  Arriving late affects you and other patients whose appointments are after yours.  Also, if you miss three or more appointments without notifying the office, you may be dismissed from the clinic at the provider's discretion.      For prescription refill requests, have your pharmacy contact our office and allow 72 hours for refills to be completed.    Today you received the following chemotherapy and/or immunotherapy agents Bortezomib ([name]) SubQ Injection (Left Side).      To help prevent nausea and vomiting after your treatment, we encourage you to take your nausea medication as directed.  BELOW ARE SYMPTOMS THAT SHOULD BE REPORTED IMMEDIATELY: *FEVER GREATER THAN 100.4 F (38 C) OR HIGHER *CHILLS OR SWEATING *NAUSEA AND VOMITING THAT IS NOT CONTROLLED WITH YOUR NAUSEA MEDICATION *UNUSUAL SHORTNESS OF BREATH *UNUSUAL BRUISING OR BLEEDING *URINARY PROBLEMS (pain or burning when urinating, or frequent urination) *BOWEL PROBLEMS (unusual diarrhea, constipation, pain near the anus) TENDERNESS IN MOUTH AND THROAT WITH OR WITHOUT PRESENCE OF ULCERS (sore throat, sores in mouth, or a toothache) UNUSUAL RASH, SWELLING OR PAIN  UNUSUAL VAGINAL DISCHARGE OR ITCHING   Items with * indicate a potential emergency and should be followed up as soon as possible or go to the Emergency Department if any problems should occur.  Please show the CHEMOTHERAPY ALERT CARD or  IMMUNOTHERAPY ALERT CARD at check-in to the Emergency Department and triage nurse.  Should you have questions after your visit or need to cancel or reschedule your appointment, please contact [facility]  Dept: [phone]  and follow the prompts.  Office hours are 8:00 a.m. to 4:30 p.m. Monday - Friday. Please note that voicemails left after 4:00 p.m. may not be returned until the following business day.  We are closed weekends and major holidays. You have access to a nurse at all times for urgent questions. Please call the main number to the clinic Dept: [phone] and follow the prompts.   For any non-urgent questions, you may also contact your provider using MyChart. We now offer e-Visits for anyone 30 and older to request care online for non-urgent symptoms. For details visit [email or web address].   Also download the MyChart app! Go to the app store, search "MyChart", open the app, select [facility], and log in with your MyChart username and password.  Due to Covid, a mask is required upon entering the hospital/clinic. If you do not have a mask, one will be given to you upon arrival. For doctor visits, patients may have 1 support person aged 47 or older with them. For treatment visits, patients cannot have anyone with them due to current Covid guidelines and our immunocompromised population.

## 2021-06-11 ENCOUNTER — Telehealth: Payer: Self-pay | Admitting: *Deleted

## 2021-06-11 ENCOUNTER — Other Ambulatory Visit: Payer: Self-pay | Admitting: *Deleted

## 2021-06-11 LAB — KAPPA/LAMBDA LIGHT CHAINS
Kappa free light chain: 28.5 mg/L — ABNORMAL HIGH (ref 3.3–19.4)
Kappa, lambda light chain ratio: 2.01 — ABNORMAL HIGH (ref 0.26–1.65)
Lambda free light chains: 14.2 mg/L (ref 5.7–26.3)

## 2021-06-11 MED ORDER — FUROSEMIDE 20 MG PO TABS: 20.0000 mg | ORAL_TABLET | Freq: Every day | ORAL | 1 refills | Status: DC

## 2021-06-11 NOTE — Telephone Encounter (Signed)
Received call from pt. Requesting refill of his Lasix. He states it is helping his wheezing and SOB some.  He is also asking to see Dr. [name] this Friday as he did not see him last week. Dr. [name] ok to see him in infusion next week. Refill of lasix done -ok per Dr. [name] Scheduling request sent for MD visit on Friday. 06/15/21

## 2021-06-12 LAB — MULTIPLE MYELOMA PANEL, SERUM
Albumin SerPl Elph-Mcnc: 3.3 g/dL (ref 2.9–4.4)
Albumin/Glob SerPl: 1.2 (ref 0.7–1.7)
Alpha 1: 0.3 g/dL (ref 0.0–0.4)
Alpha2 Glob SerPl Elph-Mcnc: 0.8 g/dL (ref 0.4–1.0)
B-Globulin SerPl Elph-Mcnc: 0.9 g/dL (ref 0.7–1.3)
Gamma Glob SerPl Elph-Mcnc: 0.9 g/dL (ref 0.4–1.8)
Globulin, Total: 2.9 g/dL (ref 2.2–3.9)
IgA: 123 mg/dL (ref 61–437)
IgG (Immunoglobin G), Serum: 1060 mg/dL (ref 603–1613)
IgM (Immunoglobulin M), Srm: 64 mg/dL (ref 20–172)
Total Protein ELP: 6.2 g/dL (ref 6.0–8.5)

## 2021-06-15 ENCOUNTER — Emergency Department (HOSPITAL_COMMUNITY): Payer: Managed Care, Other (non HMO)

## 2021-06-15 ENCOUNTER — Other Ambulatory Visit: Payer: Self-pay

## 2021-06-15 ENCOUNTER — Inpatient Hospital Stay: Payer: Managed Care, Other (non HMO)

## 2021-06-15 ENCOUNTER — Encounter (HOSPITAL_COMMUNITY): Payer: Self-pay

## 2021-06-15 ENCOUNTER — Inpatient Hospital Stay (HOSPITAL_BASED_OUTPATIENT_CLINIC_OR_DEPARTMENT_OTHER): Payer: Managed Care, Other (non HMO) | Admitting: Hematology and Oncology

## 2021-06-15 ENCOUNTER — Inpatient Hospital Stay (HOSPITAL_COMMUNITY)
Admission: EM | Admit: 2021-06-15 | Discharge: 2021-06-18 | DRG: 189 | Disposition: A | Discharge status: Home / Self Care | Payer: Managed Care, Other (non HMO) | Attending: Internal Medicine | Admitting: Internal Medicine

## 2021-06-15 VITALS — BP 129/79 | HR 66 | Resp 18

## 2021-06-15 DIAGNOSIS — E7800 Pure hypercholesterolemia, unspecified: Secondary | ICD-10-CM | POA: Diagnosis present

## 2021-06-15 DIAGNOSIS — Z8616 Personal history of COVID-19: Secondary | ICD-10-CM

## 2021-06-15 DIAGNOSIS — Z8249 Family history of ischemic heart disease and other diseases of the circulatory system: Secondary | ICD-10-CM

## 2021-06-15 DIAGNOSIS — E039 Hypothyroidism, unspecified: Secondary | ICD-10-CM | POA: Diagnosis present

## 2021-06-15 DIAGNOSIS — E079 Disorder of thyroid, unspecified: Secondary | ICD-10-CM | POA: Diagnosis present

## 2021-06-15 DIAGNOSIS — C9000 Multiple myeloma not having achieved remission: Secondary | ICD-10-CM | POA: Diagnosis present

## 2021-06-15 DIAGNOSIS — Z79899 Other long term (current) drug therapy: Secondary | ICD-10-CM | POA: Diagnosis not present

## 2021-06-15 DIAGNOSIS — M899 Disorder of bone, unspecified: Secondary | ICD-10-CM

## 2021-06-15 DIAGNOSIS — Z87891 Personal history of nicotine dependence: Secondary | ICD-10-CM | POA: Diagnosis not present

## 2021-06-15 DIAGNOSIS — T451X5A Adverse effect of antineoplastic and immunosuppressive drugs, initial encounter: Secondary | ICD-10-CM | POA: Diagnosis present

## 2021-06-15 DIAGNOSIS — J3089 Other allergic rhinitis: Secondary | ICD-10-CM | POA: Diagnosis present

## 2021-06-15 DIAGNOSIS — Z7982 Long term (current) use of aspirin: Secondary | ICD-10-CM | POA: Diagnosis not present

## 2021-06-15 DIAGNOSIS — Z7951 Long term (current) use of inhaled steroids: Secondary | ICD-10-CM | POA: Diagnosis not present

## 2021-06-15 DIAGNOSIS — Z881 Allergy status to other antibiotic agents status: Secondary | ICD-10-CM | POA: Diagnosis not present

## 2021-06-15 DIAGNOSIS — N400 Enlarged prostate without lower urinary tract symptoms: Secondary | ICD-10-CM | POA: Diagnosis present

## 2021-06-15 DIAGNOSIS — R0902 Hypoxemia: Secondary | ICD-10-CM

## 2021-06-15 DIAGNOSIS — J9601 Acute respiratory failure with hypoxia: Secondary | ICD-10-CM | POA: Diagnosis present

## 2021-06-15 DIAGNOSIS — R0600 Dyspnea, unspecified: Secondary | ICD-10-CM | POA: Diagnosis not present

## 2021-06-15 DIAGNOSIS — Z880 Allergy status to penicillin: Secondary | ICD-10-CM | POA: Diagnosis not present

## 2021-06-15 DIAGNOSIS — J4520 Mild intermittent asthma, uncomplicated: Secondary | ICD-10-CM | POA: Diagnosis present

## 2021-06-15 DIAGNOSIS — Z20822 Contact with and (suspected) exposure to covid-19: Secondary | ICD-10-CM | POA: Diagnosis present

## 2021-06-15 DIAGNOSIS — M5441 Lumbago with sciatica, right side: Secondary | ICD-10-CM | POA: Diagnosis present

## 2021-06-15 DIAGNOSIS — J4530 Mild persistent asthma, uncomplicated: Secondary | ICD-10-CM | POA: Diagnosis present

## 2021-06-15 DIAGNOSIS — Z79890 Hormone replacement therapy (postmenopausal): Secondary | ICD-10-CM | POA: Diagnosis not present

## 2021-06-15 DIAGNOSIS — Z803 Family history of malignant neoplasm of breast: Secondary | ICD-10-CM | POA: Diagnosis not present

## 2021-06-15 DIAGNOSIS — R9389 Abnormal findings on diagnostic imaging of other specified body structures: Secondary | ICD-10-CM | POA: Diagnosis not present

## 2021-06-15 DIAGNOSIS — F419 Anxiety disorder, unspecified: Secondary | ICD-10-CM | POA: Diagnosis present

## 2021-06-15 DIAGNOSIS — J45901 Unspecified asthma with (acute) exacerbation: Secondary | ICD-10-CM | POA: Diagnosis present

## 2021-06-15 DIAGNOSIS — J849 Interstitial pulmonary disease, unspecified: Secondary | ICD-10-CM | POA: Diagnosis present

## 2021-06-15 DIAGNOSIS — R0602 Shortness of breath: Secondary | ICD-10-CM | POA: Diagnosis not present

## 2021-06-15 DIAGNOSIS — Z9889 Other specified postprocedural states: Secondary | ICD-10-CM

## 2021-06-15 DIAGNOSIS — J45909 Unspecified asthma, uncomplicated: Secondary | ICD-10-CM | POA: Diagnosis present

## 2021-06-15 LAB — BLOOD GAS, ARTERIAL
Acid-Base Excess: 0 mmol/L (ref 0.0–2.0)
Bicarbonate: 23 mmol/L (ref 20.0–28.0)
FIO2: 28
O2 Saturation: 97.6 %
Patient temperature: 97.6
pCO2 arterial: 34 mmHg (ref 32.0–48.0)
pH, Arterial: 7.445 (ref 7.350–7.450)
pO2, Arterial: 96.6 mmHg (ref 83.0–108.0)

## 2021-06-15 LAB — CBC WITH DIFFERENTIAL (CANCER CENTER ONLY)
Abs Immature Granulocytes: 0.19 10*3/uL — ABNORMAL HIGH (ref 0.00–0.07)
Basophils Absolute: 0 10*3/uL (ref 0.0–0.1)
Basophils Relative: 0 %
Eosinophils Absolute: 0.4 10*3/uL (ref 0.0–0.5)
Eosinophils Relative: 4 %
HCT: 36.3 % — ABNORMAL LOW (ref 39.0–52.0)
Hemoglobin: 12.6 g/dL — ABNORMAL LOW (ref 13.0–17.0)
Immature Granulocytes: 2 %
Lymphocytes Relative: 5 %
Lymphs Abs: 0.5 10*3/uL — ABNORMAL LOW (ref 0.7–4.0)
MCH: 31.5 pg (ref 26.0–34.0)
MCHC: 34.7 g/dL (ref 30.0–36.0)
MCV: 90.8 fL (ref 80.0–100.0)
Monocytes Absolute: 0.1 10*3/uL (ref 0.1–1.0)
Monocytes Relative: 1 %
Neutro Abs: 9 10*3/uL — ABNORMAL HIGH (ref 1.7–7.7)
Neutrophils Relative %: 88 %
Platelet Count: 203 10*3/uL (ref 150–400)
RBC: 4 MIL/uL — ABNORMAL LOW (ref 4.22–5.81)
RDW: 15 % (ref 11.5–15.5)
WBC Count: 10.1 10*3/uL (ref 4.0–10.5)
nRBC: 0 % (ref 0.0–0.2)

## 2021-06-15 LAB — BASIC METABOLIC PANEL
Anion gap: 9 (ref 5–15)
BUN: 17 mg/dL (ref 8–23)
CO2: 25 mmol/L (ref 22–32)
Calcium: 8.8 mg/dL — ABNORMAL LOW (ref 8.9–10.3)
Chloride: 98 mmol/L (ref 98–111)
Creatinine, Ser: 0.87 mg/dL (ref 0.61–1.24)
GFR, Estimated: >60 mL/min (ref >60)
Glucose, Bld: 147 mg/dL — ABNORMAL HIGH (ref 70–99)
Potassium: 4.4 mmol/L (ref 3.5–5.1)
Sodium: 132 mmol/L — ABNORMAL LOW (ref 135–145)

## 2021-06-15 LAB — CBC WITH DIFFERENTIAL/PLATELET
Abs Immature Granulocytes: 0.18 10*3/uL — ABNORMAL HIGH (ref 0.00–0.07)
Basophils Absolute: 0 10*3/uL (ref 0.0–0.1)
Basophils Relative: 0 %
Eosinophils Absolute: 0 10*3/uL (ref 0.0–0.5)
Eosinophils Relative: 0 %
HCT: 38.2 % — ABNORMAL LOW (ref 39.0–52.0)
Hemoglobin: 13.1 g/dL (ref 13.0–17.0)
Immature Granulocytes: 2 %
Lymphocytes Relative: 6 %
Lymphs Abs: 0.5 10*3/uL — ABNORMAL LOW (ref 0.7–4.0)
MCH: 31.7 pg (ref 26.0–34.0)
MCHC: 34.3 g/dL (ref 30.0–36.0)
MCV: 92.5 fL (ref 80.0–100.0)
Monocytes Absolute: 0 10*3/uL — ABNORMAL LOW (ref 0.1–1.0)
Monocytes Relative: 0 %
Neutro Abs: 7.7 10*3/uL (ref 1.7–7.7)
Neutrophils Relative %: 92 %
Platelets: 216 10*3/uL (ref 150–400)
RBC: 4.13 MIL/uL — ABNORMAL LOW (ref 4.22–5.81)
RDW: 15 % (ref 11.5–15.5)
WBC: 8.4 10*3/uL (ref 4.0–10.5)
nRBC: 0 % (ref 0.0–0.2)

## 2021-06-15 LAB — CMP (CANCER CENTER ONLY)
ALT: 18 U/L (ref 0–44)
AST: 20 U/L (ref 15–41)
Albumin: 3.3 g/dL — ABNORMAL LOW (ref 3.5–5.0)
Alkaline Phosphatase: 64 U/L (ref 38–126)
Anion gap: 6 (ref 5–15)
BUN: 16 mg/dL (ref 8–23)
CO2: 25 mmol/L (ref 22–32)
Calcium: 8.8 mg/dL — ABNORMAL LOW (ref 8.9–10.3)
Chloride: 96 mmol/L — ABNORMAL LOW (ref 98–111)
Creatinine: 1.01 mg/dL (ref 0.61–1.24)
GFR, Estimated: >60 mL/min (ref >60)
Glucose, Bld: 106 mg/dL — ABNORMAL HIGH (ref 70–99)
Potassium: 4.4 mmol/L (ref 3.5–5.1)
Sodium: 127 mmol/L — ABNORMAL LOW (ref 135–145)
Total Bilirubin: 0.5 mg/dL (ref 0.3–1.2)
Total Protein: 6.9 g/dL (ref 6.5–8.1)

## 2021-06-15 LAB — LACTATE DEHYDROGENASE: LDH: 441 U/L — ABNORMAL HIGH (ref 98–192)

## 2021-06-15 LAB — TROPONIN I (HIGH SENSITIVITY)
Troponin I (High Sensitivity): 5 ng/L (ref <18)
Troponin I (High Sensitivity): 3 ng/L (ref <18)

## 2021-06-15 LAB — BRAIN NATRIURETIC PEPTIDE: B Natriuretic Peptide: 32.3 pg/mL (ref 0.0–100.0)

## 2021-06-15 MED ORDER — ACETAMINOPHEN 650 MG RE SUPP: 650.0000 mg | Freq: Four times a day (QID) | RECTAL | Status: DC | PRN

## 2021-06-15 MED ORDER — SODIUM CHLORIDE 0.9% FLUSH: 3.0000 mL | INTRAVENOUS | Status: DC | PRN

## 2021-06-15 MED ORDER — IOHEXOL 350 MG/ML SOLN
100.0000 mL | Freq: Once | INTRAVENOUS | Status: AC | PRN
  Administered 2021-06-15: 75 mL via INTRAVENOUS

## 2021-06-15 MED ORDER — LEVOTHYROXINE SODIUM 50 MCG PO TABS
175.0000 ug | ORAL_TABLET | ORAL | Status: DC
  Administered 2021-06-16: 175 ug via ORAL
  Filled 2021-06-15: qty 1

## 2021-06-15 MED ORDER — SODIUM CHLORIDE 0.9 % IV SOLN: INTRAVENOUS | Status: DC

## 2021-06-15 MED ORDER — PANTOPRAZOLE SODIUM 40 MG PO TBEC
40.0000 mg | DELAYED_RELEASE_TABLET | Freq: Every day | ORAL | Status: DC
  Administered 2021-06-16 – 2021-06-18 (×3): 40 mg via ORAL
  Filled 2021-06-15 (×3): qty 1

## 2021-06-15 MED ORDER — ASPIRIN EC 81 MG PO TBEC
81.0000 mg | DELAYED_RELEASE_TABLET | Freq: Every day | ORAL | Status: DC
  Administered 2021-06-16 – 2021-06-18 (×3): 81 mg via ORAL
  Filled 2021-06-15 (×3): qty 1

## 2021-06-15 MED ORDER — FLUTICASONE FUROATE-VILANTEROL 200-25 MCG/INH IN AEPB
1.0000 | INHALATION_SPRAY | Freq: Every day | RESPIRATORY_TRACT | Status: DC
  Administered 2021-06-16 – 2021-06-18 (×3): 1 via RESPIRATORY_TRACT
  Filled 2021-06-15: qty 28

## 2021-06-15 MED ORDER — LEVOTHYROXINE SODIUM 50 MCG PO TABS
150.0000 ug | ORAL_TABLET | ORAL | Status: DC
  Administered 2021-06-17: 150 ug via ORAL
  Filled 2021-06-15: qty 1

## 2021-06-15 MED ORDER — EZETIMIBE 10 MG PO TABS
10.0000 mg | ORAL_TABLET | Freq: Every day | ORAL | Status: DC
  Administered 2021-06-16 – 2021-06-17 (×2): 10 mg via ORAL
  Filled 2021-06-15 (×2): qty 1

## 2021-06-15 MED ORDER — SODIUM CHLORIDE 0.9 % IV SOLN: 250.0000 mL | INTRAVENOUS | Status: DC | PRN

## 2021-06-15 MED ORDER — SIMVASTATIN 40 MG PO TABS
40.0000 mg | ORAL_TABLET | Freq: Every day | ORAL | Status: DC
  Administered 2021-06-16 – 2021-06-17 (×2): 40 mg via ORAL
  Filled 2021-06-15 (×2): qty 1

## 2021-06-15 MED ORDER — EZETIMIBE-SIMVASTATIN 10-40 MG PO TABS
1.0000 | ORAL_TABLET | Freq: Every day | ORAL | Status: DC
  Filled 2021-06-15: qty 1

## 2021-06-15 MED ORDER — MONTELUKAST SODIUM 10 MG PO TABS
10.0000 mg | ORAL_TABLET | Freq: Every day | ORAL | Status: DC
  Administered 2021-06-16 – 2021-06-17 (×2): 10 mg via ORAL
  Filled 2021-06-15 (×2): qty 1

## 2021-06-15 MED ORDER — SODIUM CHLORIDE 0.9% FLUSH
3.0000 mL | Freq: Two times a day (BID) | INTRAVENOUS | Status: DC
  Administered 2021-06-15 – 2021-06-18 (×6): 3 mL via INTRAVENOUS

## 2021-06-15 MED ORDER — ACETAMINOPHEN 325 MG PO TABS
650.0000 mg | ORAL_TABLET | Freq: Four times a day (QID) | ORAL | Status: DC | PRN
  Administered 2021-06-15 – 2021-06-17 (×3): 650 mg via ORAL
  Filled 2021-06-15 (×3): qty 2

## 2021-06-15 MED ORDER — ENOXAPARIN SODIUM 40 MG/0.4ML IJ SOSY
40.0000 mg | PREFILLED_SYRINGE | INTRAMUSCULAR | Status: DC
  Administered 2021-06-16: 40 mg via SUBCUTANEOUS
  Filled 2021-06-15: qty 0.4

## 2021-06-15 MED ORDER — ALBUTEROL SULFATE (2.5 MG/3ML) 0.083% IN NEBU: 3.0000 mL | INHALATION_SOLUTION | RESPIRATORY_TRACT | Status: DC | PRN

## 2021-06-15 NOTE — ED Notes (Signed)
Respiratory called for ABG

## 2021-06-15 NOTE — ED Triage Notes (Signed)
Pt coming from [facility] center. Pt reports having increasing SOB over the last 2-3 weeks. Per [facility] staff pt walked from his car to the front door and was very SOB and diaphoretic. Per staff pt O2 stats when he is sitting were 97% but when pt started walking they deceased to 87%. Pt is on lasix.

## 2021-06-15 NOTE — ED Notes (Signed)
Pt placed on 2LNC. Pt stating at 94-95% RA.

## 2021-06-15 NOTE — Progress Notes (Signed)
Pt c/o shortness of breath with exertion. This has gotten increasingly worse since last Thursday. At rest pt vital signs stable with O2 sats 96-98%. When walking around unit, O2 sats 87%, diaphoretic, dizzy, coughing and unable to talk. HR remained 60-70's both at rest and with exertion. When back at rest, all vital signs stable. MD made aware and will see pt asap.

## 2021-06-15 NOTE — Progress Notes (Signed)
[place] Telephone:([phone]) [phone]   [phone]  PROGRESS NOTE  Patient Care Team: Antony Contras, MD as PCP - General (Family Medicine)  Hematological/Oncological History # IgG Kappa Multiple Myeloma 02/02/2021:  Presented to [facility] ED due to right sided flank tenderness with bruising. CT abdomen/pelvis: Multiple small lytic lesions in the thoracolumbar spine and bilateral pelvis -SPEP: IgG 2,082 (H), M Protein 1.8 (H). IFE shows IgG monoclonal protein with kappa light chain specificity.  -LDH 169, CBC normal, CMP normal except for sodium 131 (L), Chloride 96 (L).   02/14/2021: Establish care with Dede Query PA-C 02/22/2021: bone marrow biopsy confirms the diagnosis of Multiple Myeloma with a monoclonal plasma cell population.  03/16/2021: Cycle 1 Day 1 of VRd chemotherapy  04/06/2021: Cycle 2 Day 1 of VRd chemotherapy  04/27/2021: Cycle 3 Day 1 of VRd chemotherapy  05/18/2021: Cycle 4 Day 1 of VRd chemotherapy  06/01/2021: drop dexamethasone to 81m PO weekly and start lasix due to shortness of breath.  06/15/2021: Desaturation to 87% on ambulation. HELD velcade today and sent to ED for evaluation.   Interval History:  [name] 65y.o. male with medical history significant for IgG Kappa multiple myeloma who presents for a follow up visit. The patient's last visit was on 06/01/2021. In the interim since the last visit he has continued on VRd chemotherapy.   On exam today Samuel [name] is accompanied by his wife. He has unfortunately been having worsening issues with shortness of breath.  He notes he has been doing "okay" but then [place] to the [place] over the last weekend and was having marked difficulty with shortness of breath.  He is not able to walk very far, only a few steps before he becomes short of breath.  In the chemotherapy room today he was ambulated and fortunately had a drop in his saturation rate down to 87%.  He does has been compliant with his  Lasix therapy and has been producing urine.  He is also dropped his steroid dose as requested.  Given these issues I have opted to have the patient transferred to the emergency department for further urgent evaluation.  He has had no issues with bleeding, bruising, or dark stools.  Denies any fevers, chills, sweats, nausea, vomiting or diarrhea.  A full 10 point ROS is listed below.  MEDICAL HISTORY:  Past Medical History:  Diagnosis Date   Asthma    High cholesterol    under control.    Perennial allergic rhinitis    Sciatic pain, right    Seasonal allergic rhinitis    Thyroid disease     SURGICAL HISTORY: Past Surgical History:  Procedure Laterality Date   NASAL SINUS SURGERY     NECK SURGERY  1996    SOCIAL HISTORY: Social History   Socioeconomic History   Marital status: Married    Spouse name: Not on file   Number of children: Not on file   Years of education: Not on file   Highest education level: Not on file  Occupational History   Not on file  Tobacco Use   Smoking status: Former    Packs/day: 0.10    Years: 5.00    Pack years: 0.50    Types: Cigarettes    Quit date: 10/21/1982    Years since quitting: 38.6   Smokeless tobacco: Never  Substance and Sexual Activity   Alcohol use: Yes    Comment: 1 drink daily   Drug use: No  Sexual activity: Not on file  Other Topics Concern   Not on file  Social History Narrative   Not on file   Social Determinants of Health   Financial Resource Strain: Not on file  Food Insecurity: Not on file  Transportation Needs: Not on file  Physical Activity: Not on file  Stress: Not on file  Social Connections: Not on file  Intimate Partner Violence: Not on file    FAMILY HISTORY: Family History  Problem Relation Age of Onset   Allergies Father    Allergies Mother    Hypertension Other    Heart disease Other    Breast cancer Paternal Grandmother     ALLERGIES:  is allergic to clarithromycin and  penicillins.  MEDICATIONS:  No current facility-administered medications for this visit.   Current Outpatient Medications  Medication Sig Dispense Refill   acyclovir (ZOVIRAX) 400 MG tablet Take 1 tablet (400 mg total) by mouth 2 (two) times daily. 60 tablet 3   albuterol (VENTOLIN HFA) 108 (90 Base) MCG/ACT inhaler Inhale 2 puffs into the lungs every 6 (six) hours as needed for wheezing or shortness of breath. 8 g 1   allopurinol (ZYLOPRIM) 300 MG tablet Take 300 mg by mouth daily.     ALPRAZolam (XANAX) 1 MG tablet Take 1 mg by mouth 2 (two) times daily as needed for anxiety. (Patient not taking: No sig reported)     aspirin EC 81 MG tablet Take 81 mg by mouth daily. Swallow whole.     ASSESS FULL RANGE PEAK METER DEVI as directed.     BREO ELLIPTA 200-25 MCG/INH AEPB Inhale 1 puff into the lungs daily.     cholecalciferol (VITAMIN D) 1000 UNITS tablet Take 3,000 Units by mouth daily.     dexamethasone (DECADRON) 4 MG tablet Take 10 tablets (40 mg total) by mouth once a week. 40 tablet 3   EPINEPHrine (EPI-PEN) 0.3 mg/0.3 mL DEVI Inject 0.3 mg into the muscle once as needed. (Patient not taking: No sig reported)     ezetimibe-simvastatin (VYTORIN) 10-40 MG per tablet Take 1 tablet by mouth at bedtime.     furosemide (LASIX) 20 MG tablet Take 1 tablet (20 mg total) by mouth daily. 14 tablet 1   lenalidomide ([name]) 25 MG capsule TAKE 1 CAPSULE DAILY FOR 14 DAYS ON, 7 DAYS OFF, REPEAT EVERY 21 DAYS [employer] auth # [id number]  Date : 05/29/21 14 capsule 0   levothyroxine (SYNTHROID) 150 MCG tablet Take 150 mcg by mouth every other day. Alternates with 175 mcg     levothyroxine (SYNTHROID) 175 MCG tablet Take 1 tablet by mouth every other day.     montelukast (SINGULAIR) 10 MG tablet Take 10 mg by mouth at bedtime.     Multiple Vitamin (MULTIVITAMIN) tablet Take 1 tablet by mouth daily.     omeprazole (PRILOSEC) 40 MG capsule Take 1 capsule by mouth daily.     ondansetron (ZOFRAN) 8 MG tablet  Take 1 tablet (8 mg total) by mouth every 8 (eight) hours as needed for nausea or vomiting. (Patient not taking: No sig reported) 30 tablet 0   prochlorperazine (COMPAZINE) 10 MG tablet Take 1 tablet (10 mg total) by mouth every 6 (six) hours as needed for nausea or vomiting. (Patient not taking: No sig reported) 30 tablet 0   sildenafil (REVATIO) 20 MG tablet 2-5 tablets one hour prior to sexual intimacy     tamsulosin (FLOMAX) 0.4 MG CAPS capsule  triamcinolone cream (KENALOG) 0.1 % Apply topically.     Zinc 50 MG TABS Take by mouth.     Facility-Administered Medications Ordered in Other Visits  Medication Dose Route Frequency Provider Last Rate Last Admin   0.9 %  sodium chloride infusion   Intravenous Continuous Lacretia Leigh, MD 10 mL/hr at 06/15/21 1539 New Bag at 06/15/21 1539   iohexol (OMNIPAQUE) 350 MG/ML injection 100 mL  100 mL Intravenous Once PRN Horton, Alvin Critchley, DO        REVIEW OF SYSTEMS:   Constitutional: ( - ) fevers, ( - )  chills , ( - ) night sweats Eyes: ( - ) blurriness of vision, ( - ) double vision, ( - ) watery eyes Ears, nose, mouth, throat, and face: ( - ) mucositis, ( - ) sore throat Respiratory: ( - ) cough, ( - ) dyspnea, ( - ) wheezes Cardiovascular: ( - ) palpitation, ( - ) chest discomfort, ( - ) lower extremity swelling Gastrointestinal:  ( - ) nausea, ( - ) heartburn, ( - ) change in bowel habits Skin: ( - ) abnormal skin rashes Lymphatics: ( - ) new lymphadenopathy, ( - ) easy bruising Neurological: ( - ) numbness, ( - ) tingling, ( - ) new weaknesses Behavioral/Psych: ( - ) mood change, ( - ) new changes  All other systems were reviewed with the patient and are negative.  PHYSICAL EXAMINATION: ECOG PERFORMANCE STATUS: 0 - Asymptomatic  Vitals:   06/15/21 1325  BP: 129/79  Pulse: 66  Resp: 18  SpO2: 97%     There were no vitals filed for this visit.  GENERAL: well appearing middle aged Caucasian male alert, no distress and  comfortable SKIN: skin color, texture, turgor are normal, no rashes or significant lesions EYES: conjunctiva are pink and non-injected, sclera clear LUNGS: No overt fine crackles but some coarse lung sounds.  No evidence of fluid overload.  Modest wheezing in upper lung fields. HEART: regular rate & rhythm and no murmurs and no lower extremity edema Musculoskeletal: no cyanosis of digits and no clubbing  PSYCH: alert & oriented x 3, fluent speech NEURO: no focal motor/sensory deficits  LABORATORY DATA:  I have reviewed the data as listed CBC Latest Ref Rng & Units 06/15/2021 06/15/2021 06/08/2021  WBC 4.0 - 10.5 K/uL 8.4 10.1 7.5  Hemoglobin 13.0 - 17.0 g/dL 13.1 12.6(L) 12.8(L)  Hematocrit 39.0 - 52.0 % 38.2(L) 36.3(L) 37.2(L)  Platelets 150 - 400 K/uL 216 203 233    CMP Latest Ref Rng & Units 06/15/2021 06/08/2021 06/01/2021  Glucose 70 - 99 mg/dL 106(H) 109(H) 112(H)  BUN 8 - 23 mg/dL '16 17 16  ' Creatinine 0.61 - 1.24 mg/dL 1.01 1.05 0.96  Sodium 135 - 145 mmol/L 127(L) 129(L) 130(L)  Potassium 3.5 - 5.1 mmol/L 4.4 4.7 4.2  Chloride 98 - 111 mmol/L 96(L) 97(L) 100  CO2 22 - 32 mmol/L '25 22 22  ' Calcium 8.9 - 10.3 mg/dL 8.8(L) 8.8(L) 8.4(L)  Total Protein 6.5 - 8.1 g/dL 6.9 7.1 6.3(L)  Total Bilirubin 0.3 - 1.2 mg/dL 0.5 0.3 0.5  Alkaline Phos 38 - 126 U/L 64 63 53  AST 15 - 41 U/L '20 21 16  ' ALT 0 - 44 U/L '18 22 21    ' Lab Results  Component Value Date   MPROTEIN Not Observed 06/08/2021   MPROTEIN 0.4 (H) 05/11/2021   MPROTEIN 0.8 (H) 04/13/2021   Lab Results  Component Value Date   KPAFRELGTCHN  28.5 (H) 06/08/2021   KPAFRELGTCHN 29.4 (H) 05/11/2021   KPAFRELGTCHN 31.5 (H) 04/13/2021   LAMBDASER 14.2 06/08/2021   LAMBDASER 14.7 05/11/2021   LAMBDASER 12.8 04/13/2021   KAPLAMBRATIO 2.01 (H) 06/08/2021   KAPLAMBRATIO 2.00 (H) 05/11/2021   KAPLAMBRATIO 2.46 (H) 04/13/2021    RADIOGRAPHIC STUDIES: I have personally reviewed the radiological images as listed and agreed with  the findings in the report: lytic lesions in the hip bones bilaterally.  DG Chest 2 View  Result Date: 05/27/2021 CLINICAL DATA:  Shortness of breath.  Multiple myeloma. EXAM: CHEST - 2 VIEW COMPARISON:  February 02, 2021 FINDINGS: The heart size is borderline to mildly enlarged. The hila and mediastinum are normal. No pneumothorax. Diffuse interstitial opacities in the lungs are identified, more pronounced in the interval. No focal infiltrate, nodule, or mass. Colonic interposition between the dome of the liver in the right diaphragm. No other abnormalities. IMPRESSION: Findings are most in keeping with mild cardiomegaly and pulmonary edema. An atypical infection could have a similar appearance. Electronically Signed   By: Dorise Bullion III M.D   On: 05/27/2021 16:11   DG Chest Port 1 View  Result Date: 06/15/2021 CLINICAL DATA:  Shortness of breath. EXAM: PORTABLE CHEST 1 VIEW COMPARISON:  05/25/2021 FINDINGS: Bilateral low lung volumes. Diffuse fine interstitial lung densities are again noted. Heart size is grossly stable but accentuated by the low lung volumes. No large areas of consolidation. Negative for a pneumothorax. IMPRESSION: Low lung volumes with persistent diffuse parenchymal lung densities. Lung disease has minimally changed. Electronically Signed   By: Markus Daft M.D.   On: 06/15/2021 15:25     ASSESSMENT & PLAN Samuel [name] 65 y.o. male with medical history significant for IgG Kappa multiple myeloma who presents for a follow up visit.   After review of the labs, review of the records, and discussion with the patient the findings are most consistent with an IgG kappa multiple myeloma.  The patient has 2 lytic lesions within the pelvic bones (more noted in spine on CT scan) as well as 10% plasma cells within the bone marrow biopsy.  This combined with his serological findings confirm the diagnosis of multiple myeloma.  The treatment of choice for this patient's multiple myeloma is  VRd. This will consist of bortezomib 1.51m/m2 on days 1, 8, 15, dexamethasone 437mon days 1,8, and 15, and revlimid 2561mO daily days 1-14. Cycles will consist of 21 days. We will use this regimen to stabilize the patient's myeloma, then make a referral to a BMT center of his choosing for consideration of a bone marrow transplant once he has been noted to have a good response (VGPR or better). Zometa was started after dental clearance was obtained. This will be continued x 2 years.   R-IPSS score: Stage 2. PFS of 42 months  #Shortness of Breath -- Progressively worsening shortness of breath on exam today.  Desaturation down to 87% with ambulation --We will hold chemotherapy at this time and have the patient sent to the emergency department for further evaluation --No improvement decreasing dexamethasone down to 20 mg p.o. weekly and starting Lasix 20 mg p.o. daily --Encouraged the patient to use inhalers provided to him by his primary care provider --May potentially be a side effect of the bortezomib.  We recommend holding at this time. --Sent to the emergency department, strong recommendations for admission.  We recommend chest x-ray, CT PE study, echocardiogram, and pulmonology consult. --Okay to hold chemotherapy while  patient is in house. --Oncology will continue to follow.  # IgG Kappa Multiple Myeloma (t11;14, standard risk)  --diagnostic criteria was met with monoclonal plasma cells in the bone marrow and lytic lesions on the bone --recommend proceeding with VRd chemotherapy as noted above --patient is young and healthy enough for consideration of BMT, though he is borderline with his age. Will consider referral pending response to treatment.  -- Cycle 1 Day 1 started on 03/16/2021 --decreased dexamethasone to 5m PO weekly due to fluid overload. Also started lasix 281mPO (changed on 06/01/2021) --today is Cycle 5 Day 8 --RTC in 2 weeks for Cycle 6 Day 1 of treatment  #Supportive  Care -- chemotherapy education complete --zometa therapy started on 04/05/2021 (4 mg IV q 3 months), next dose Sept 2022 --ASA 8181mO daily for thromboprophylaxis on revlimid -- zofran 8mg84mH PRN and compazine 10mg64mq6H for nausea -- acyclovir 400mg 87mID for VCZ prophylaxis -- no pain medication required at this time.   No orders of the defined types were placed in this encounter.   All questions were answered. The patient knows to call the clinic with any problems, questions or concerns.  A total of more than 30 minutes were spent on this encounter with face-to-face time and non-face-to-face time, including preparing to see the patient, ordering tests and/or medications, counseling the patient and coordination of care as outlined above.   Shalondra Wunschel TLedell Peoplesepartment of Hematology/Oncology [facility]: [phone]: [phone]: [email or web address]  06/15/2021 4:32 PM

## 2021-06-15 NOTE — ED Provider Notes (Addendum)
Patient signed out to me by previous provider. Please refer to their note for full HPI.  Briefly this is a 65 year old male who presented to the emergency department exertional shortness of breath.  Noted to be hypoxic to the mid 80s on exertion on room air.  Initially with appropriate oxygen saturation at rest.  Pending CT PE study for further evaluation. Physical Exam  BP (!) 163/97   Pulse 66   Temp 98 F (36.7 C) (Oral)   Resp 18   Ht '5\' 9"'$  (1.753 m)   Wt 96.1 kg   SpO2 99%   BMI 31.29 kg/m   Physical Exam Vitals and nursing note reviewed.  Constitutional:      Appearance: Normal appearance.  HENT:     Head: Normocephalic.     Mouth/Throat:     Mouth: Mucous membranes are moist.  Cardiovascular:     Rate and Rhythm: Normal rate.  Pulmonary:     Effort: Pulmonary effort is normal. No tachypnea or respiratory distress.     Breath sounds: Decreased breath sounds present.  Abdominal:     Palpations: Abdomen is soft.     Tenderness: There is no abdominal tenderness.  Skin:    General: Skin is warm.  Neurological:     Mental Status: He is alert and oriented to person, place, and time. Mental status is at baseline.  Psychiatric:        Mood and Affect: Mood normal.    ED Course/Procedures     .Critical Care  Date/Time: 06/15/2021 7:47 PM Performed by: Lorelle Gibbs, DO Authorized by: Lorelle Gibbs, DO   Critical care provider statement:    Critical care time (minutes):  40   Critical care was necessary to treat or prevent imminent or life-threatening deterioration of the following conditions:  Respiratory failure   Critical care was time spent personally by me on the following activities:  Discussions with consultants, evaluation of patient's response to treatment, examination of patient, ordering and performing treatments and interventions, ordering and review of laboratory studies, ordering and review of radiographic studies, pulse oximetry, re-evaluation of  patient's condition, obtaining history from patient or surrogate and review of old charts  MDM   CT PE study shows no PE but does identify interstitial lung disease.  Since being here while at rest in seated the patient dropped his oxygen saturation down to 91% and felt short of breath.  Placed on 2 L nasal cannula with improvement.  Dr. [name] is oncologist has been made aware, recommends medical admission for further evaluation as this has been an ongoing issue for the patient.  Patient will be admitted to hospitalist for further evaluation and treatment.  Patients evaluation and results requires admission for further treatment and care. Patient agrees with admission plan, offers no new complaints and is stable/unchanged at time of admit.       Lorelle Gibbs, DO 06/15/21 1946    Lorelle Gibbs, DO 06/15/21 [phone]

## 2021-06-15 NOTE — ED Notes (Signed)
Floor contacted regarding purple man for report.

## 2021-06-15 NOTE — ED Provider Notes (Signed)
[facility] DEPT Provider Note   CSN: [id number] Arrival date & time: 06/15/21  1402     History Chief Complaint  Patient presents with   Shortness of Breath    Samuel [name] is a 65 y.o. male.  65 year old male with history of multiple myeloma presents with several days of increasing dyspnea exertion.  Patient had his chemotherapy today and was more short of breath with walking.  Does have a history of asthma and he says related to seasonal allergies.  Patient denies any significant cough.  Not had a fever.  Not had any chest pain.  No new swelling to his lower extremities.  Patient sats decreased to 87% with ambulation.  Does have a history of CHF before in the past which was treated with Lasix.      Past Medical History:  Diagnosis Date   Asthma    High cholesterol    under control.    Perennial allergic rhinitis    Sciatic pain, right    Seasonal allergic rhinitis    Thyroid disease     Patient Active Problem List   Diagnosis Date Noted   Multiple myeloma ([place]) 03/05/2021   Multiple myeloma not having achieved remission ([place]) 03/05/2021   Bone lesion 02/14/2021   Monoclonal gammopathy 02/14/2021   High cholesterol    Allergic rhinitis 11/23/2020   Mild intermittent asthma 11/23/2020   SOB (shortness of breath) 10/04/2011    Past Surgical History:  Procedure Laterality Date   NASAL SINUS SURGERY     NECK SURGERY  1996       Family History  Problem Relation Age of Onset   Allergies Father    Allergies Mother    Hypertension Other    Heart disease Other    Breast cancer Paternal Grandmother     Social History   Tobacco Use   Smoking status: Former    Packs/day: 0.10    Years: 5.00    Pack years: 0.50    Types: Cigarettes    Quit date: 10/21/1982    Years since quitting: 38.6   Smokeless tobacco: Never  Substance Use Topics   Alcohol use: Yes    Comment: 1 drink daily   Drug use: No    Home Medications Prior  to Admission medications   Medication Sig Start Date End Date Taking? Authorizing Provider  acyclovir (ZOVIRAX) 400 MG tablet Take 1 tablet (400 mg total) by mouth 2 (two) times daily. 03/05/21   Orson Slick, MD  albuterol (VENTOLIN HFA) 108 (90 Base) MCG/ACT inhaler Inhale 2 puffs into the lungs every 6 (six) hours as needed for wheezing or shortness of breath. 05/28/21   Orson Slick, MD  allopurinol (ZYLOPRIM) 300 MG tablet Take 300 mg by mouth daily. 03/05/21   [provider]  ALPRAZolam Duanne Moron) 1 MG tablet Take 1 mg by mouth 2 (two) times daily as needed for anxiety. Patient not taking: No sig reported    [provider]  aspirin EC 81 MG tablet Take 81 mg by mouth daily. Swallow whole.    [provider]  ASSESS FULL RANGE PEAK METER [place] as directed. 07/15/11   [provider]  BREO ELLIPTA 200-25 MCG/INH AEPB Inhale 1 puff into the lungs daily. 05/31/21   [provider]  cholecalciferol (VITAMIN D) 1000 UNITS tablet Take 3,000 Units by mouth daily.    [provider]  dexamethasone (DECADRON) 4 MG tablet Take 10 tablets (40  mg total) by mouth once a week. 03/05/21   Orson Slick, MD  EPINEPHrine (EPI-PEN) 0.3 mg/0.3 mL DEVI Inject 0.3 mg into the muscle once as needed. Patient not taking: No sig reported    [provider]  ezetimibe-simvastatin (VYTORIN) 10-40 MG per tablet Take 1 tablet by mouth at bedtime.    [provider]  furosemide (LASIX) 20 MG tablet Take 1 tablet (20 mg total) by mouth daily. 06/11/21   Orson Slick, MD  lenalidomide ([name]) 25 MG capsule TAKE 1 CAPSULE DAILY FOR 14 DAYS ON, 7 DAYS OFF, REPEAT EVERY 21 DAYS [employer] auth # [id number]  Date : 05/29/21 05/29/21   Orson Slick, MD  levothyroxine (SYNTHROID) 150 MCG tablet Take 150 mcg by mouth every other day. Alternates with 175 mcg    [provider]  levothyroxine (SYNTHROID) 175 MCG tablet Take 1 tablet by mouth every  other day. 04/05/21   [provider]  montelukast (SINGULAIR) 10 MG tablet Take 10 mg by mouth at bedtime.    [provider]  Multiple Vitamin (MULTIVITAMIN) tablet Take 1 tablet by mouth daily.    [provider]  omeprazole (PRILOSEC) 40 MG capsule Take 1 capsule by mouth daily. 03/05/21   [provider]  ondansetron (ZOFRAN) 8 MG tablet Take 1 tablet (8 mg total) by mouth every 8 (eight) hours as needed for nausea or vomiting. Patient not taking: No sig reported 03/05/21   Orson Slick, MD  prochlorperazine (COMPAZINE) 10 MG tablet Take 1 tablet (10 mg total) by mouth every 6 (six) hours as needed for nausea or vomiting. Patient not taking: No sig reported 03/05/21   Orson Slick, MD  sildenafil (REVATIO) 20 MG tablet 2-5 tablets one hour prior to sexual intimacy 07/12/19   [provider]  tamsulosin (FLOMAX) 0.4 MG CAPS capsule     [provider]  triamcinolone cream (KENALOG) 0.1 % Apply topically. 01/15/21   [provider]  Zinc 50 MG TABS Take by mouth.    [provider]    Allergies    Clarithromycin and Penicillins  Review of Systems   Review of Systems  All other systems reviewed and are negative.  Physical Exam Updated Vital Signs BP (!) 156/96 (BP Location: Left Arm)   Pulse 63   Temp 98 F (36.7 C) (Oral)   Resp 17   Ht 1.753 m (_0 )   Wt 96.1 kg   SpO2 97%   BMI 31.29 kg/m   Physical Exam Vitals and nursing note reviewed.  Constitutional:      General: He is not in acute distress.    Appearance: Normal appearance. He is well-developed. He is not toxic-appearing.  HENT:     Head: Normocephalic and atraumatic.  Eyes:     General: Lids are normal.     Conjunctiva/sclera: Conjunctivae normal.     Pupils: Pupils are equal, round, and reactive to light.  Neck:     Thyroid: No thyroid mass.     Trachea: No tracheal deviation.  Cardiovascular:     Rate and Rhythm: Normal rate  and regular rhythm.     Heart sounds: Normal heart sounds. No murmur heard.   No gallop.  Pulmonary:     Effort: Pulmonary effort is normal. No respiratory distress.     Breath sounds: No stridor. Examination of the right-lower field reveals decreased breath sounds. Examination of the left-lower field reveals decreased  breath sounds. Decreased breath sounds present. No wheezing, rhonchi or rales.  Abdominal:     General: There is no distension.     Palpations: Abdomen is soft.     Tenderness: There is no abdominal tenderness. There is no rebound.  Musculoskeletal:        General: No tenderness. Normal range of motion.     Cervical back: Normal range of motion and neck supple.  Skin:    General: Skin is warm and dry.     Findings: No abrasion or rash.  Neurological:     Mental Status: He is alert and oriented to person, place, and time. Mental status is at baseline.     GCS: GCS eye subscore is 4. GCS verbal subscore is 5. GCS motor subscore is 6.     Cranial Nerves: Cranial nerves are intact. No cranial nerve deficit.     Sensory: No sensory deficit.     Motor: Motor function is intact.  Psychiatric:        Attention and Perception: Attention normal.        Speech: Speech normal.        Behavior: Behavior normal.    ED Results / Procedures / Treatments   Labs (all labs ordered are listed, but only abnormal results are displayed) Labs Reviewed  CBC WITH DIFFERENTIAL/PLATELET  BASIC METABOLIC PANEL  BRAIN NATRIURETIC PEPTIDE  TROPONIN I (HIGH SENSITIVITY)    EKG EKG Interpretation  Date/Time:  Friday June 15 2021 14:18:45 EDT Ventricular Rate:  65 PR Interval:  209 QRS Duration: 101 QT Interval:  431 QTC Calculation: 449 R Axis:   10 Text Interpretation: Sinus rhythm Confirmed by Lacretia Leigh ([id number]) on 06/15/2021 2:59:47 PM  Radiology No results found.  Procedures Procedures   Medications Ordered in ED Medications  0.9 %  sodium chloride infusion (has no  administration in time range)    ED Course  I have reviewed the triage vital signs and the nursing notes.  Pertinent labs & imaging results that were available during my care of the patient were reviewed by me and considered in my medical decision making (see chart for details).    MDM Rules/Calculators/A&P                           Patient's chest x-ray does show lung disease.  No overt signs of CHF.  He is having increasing dyspnea exertion.  Patient hemoglobin is stable.  Concern for possible PE.  CT of the chest is pending at this time.  Will sign out to oncoming provider Final Clinical Impression(s) / ED Diagnoses Final diagnoses:  None    Rx / DC Orders ED Discharge Orders     None        Lacretia Leigh, MD 06/15/21 [phone]

## 2021-06-15 NOTE — H&P (Signed)
History and Physical    DELMUS WARWICK XTK:[id number] DOB: Jan 11, 1956 DOA: 06/15/2021  PCP: Antony Contras, MD   Patient coming from: Home  Chief Complaint: SOB  HPI: Samuel [name] is a 65 y.o. male with medical history significant for HLD, hypothyroidism, multiple Myeloma followed by Oncology, mild intermittent asthma who presents for evaluation of SOB that is worsened with exertion.  He reports he has always had some shortness of breath when he exerts himself that generally in improves as he continues activities or exercise.  He states in the last few weeks has become more pronounced and has had more shortness of breath at rest.  When he arrived to the emergency room he had oxygen saturations in the mid 80s on room air with walking.  He has not had any fever or chills.  States he was told that asthma was a child and used an inhaler briefly when a child but had no treatment in his early adulthood until now.  He is reports a few weeks ago he was placed on albuterol and Breo due to his shortness of breath.  He states he has never had lung function testing performed.  He reports he does have a history of edema and takes Lasix intermittently but states he has never been told he had cardiac disease.  He states his father died of coronary artery disease.  He reports he has always had shortness of breath and feels like he has to try to take a big deep breath but feels constricting his chest when he first tries exercising but improves as he continues to do his activity.  He denies any recent travel.  Denies any history of DVT or PE.  Has not been having leg edema and denies prolonged immobilization or recent long travel. He states he smoked briefly in his early 80s but quit before he was age 65.  Denies illicit drug use.  ED Course: In the emergency room patient initially had an oxygen saturation of 85 to 87% on room air with ambulation and he appeared short of breath.  Breathing improved when he was at rest  but after few hours he did develop shortness of breath while at rest and desatted to 91% was placed on oxygen by nasal cannula.  CT of the chest did not show any pulmonary emboli but does show interstitial lung disease.  He also has moderate aortic atherosclerosis.  He was 65.3 and troponin 5.  EKG showed no ischemic changes.  CBC is unremarkable.  BMP unremarkable except for sodium mildly decreased at 132.  Hospitalist service been asked to admit for further management  Review of Systems:  General: Denies fever, chills, weight loss, night sweats.  Denies dizziness.  Denies change in appetite HENT: Denies head trauma, headache, denies change in hearing, tinnitus.  Denies nasal congestion or bleeding.  Denies sore throat, sores in mouth.  Denies difficulty swallowing Eyes: Denies blurry vision, pain in eye, drainage.  Denies discoloration of eyes. Neck: Denies pain.  Denies swelling.  Denies pain with movement. Cardiovascular: Denies chest pain, palpitations.  Denies edema.  Denies orthopnea Respiratory: Reports shortness of breath with exertion. Mild intermittent dry cough.  Denies wheezing.  Denies sputum production Gastrointestinal: Denies abdominal pain, swelling.  Denies nausea, vomiting, diarrhea.  Denies melena.  Denies hematemesis. Musculoskeletal: Denies limitation of movement.  Denies deformity or swelling.  Denies pain.  Denies arthralgias or myalgias. Genitourinary: Denies pelvic pain.  Denies urinary frequency or hesitancy.  Denies dysuria.  Skin:  Denies rash.  Denies petechiae, purpura, ecchymosis. Neurological: Denies syncope.  Denies seizure activity. Denies paresthesia. Denies slurred speech, drooping face. Denies visual change. Psychiatric: Denies depression, anxiety. Denies hallucinations.  Past Medical History:  Diagnosis Date   Asthma    High cholesterol    under control.    Perennial allergic rhinitis    Sciatic pain, right    Seasonal allergic rhinitis    Thyroid disease      Past Surgical History:  Procedure Laterality Date   NASAL SINUS SURGERY     NECK SURGERY  1996    Social History  reports that he quit smoking about 38 years ago. He has a 0.50 pack-year smoking history. He has never used smokeless tobacco. He reports current alcohol use. He reports that he does not use drugs.  Allergies  Allergen Reactions   Clarithromycin    Penicillins     Family History  Problem Relation Age of Onset   Allergies Father    Allergies Mother    Hypertension Other    Heart disease Other    Breast cancer Paternal Grandmother      Prior to Admission medications   Medication Sig Start Date End Date Taking? Authorizing Provider  acyclovir (ZOVIRAX) 400 MG tablet Take 1 tablet (400 mg total) by mouth 2 (two) times daily. 03/05/21   Orson Slick, MD  albuterol (VENTOLIN HFA) 108 (90 Base) MCG/ACT inhaler Inhale 2 puffs into the lungs every 6 (six) hours as needed for wheezing or shortness of breath. 05/28/21   Orson Slick, MD  allopurinol (ZYLOPRIM) 300 MG tablet Take 300 mg by mouth daily. 03/05/21   [provider]  ALPRAZolam Duanne Moron) 1 MG tablet Take 1 mg by mouth 2 (two) times daily as needed for anxiety. Patient not taking: No sig reported    [provider]  aspirin EC 81 MG tablet Take 81 mg by mouth daily. Swallow whole.    [provider]  ASSESS FULL RANGE PEAK METER [place] as directed. 07/15/11   [provider]  BREO ELLIPTA 200-25 MCG/INH AEPB Inhale 1 puff into the lungs daily. 05/31/21   [provider]  cholecalciferol (VITAMIN D) 1000 UNITS tablet Take 3,000 Units by mouth daily.    [provider]  dexamethasone (DECADRON) 4 MG tablet Take 10 tablets (40 mg total) by mouth once a week. 03/05/21   Orson Slick, MD  EPINEPHrine (EPI-PEN) 0.3 mg/0.3 mL DEVI Inject 0.3 mg into the muscle once as needed. Patient not taking: No sig reported    [provider]   ezetimibe-simvastatin (VYTORIN) 10-40 MG per tablet Take 1 tablet by mouth at bedtime.    [provider]  furosemide (LASIX) 20 MG tablet Take 1 tablet (20 mg total) by mouth daily. 06/11/21   Orson Slick, MD  lenalidomide ([name]) 25 MG capsule TAKE 1 CAPSULE DAILY FOR 14 DAYS ON, 7 DAYS OFF, REPEAT EVERY 21 DAYS [employer] auth # [id number]  Date : 05/29/21 05/29/21   Orson Slick, MD  levothyroxine (SYNTHROID) 150 MCG tablet Take 150 mcg by mouth every other day. Alternates with 175 mcg    [provider]  levothyroxine (SYNTHROID) 175 MCG tablet Take 1 tablet by mouth every other day. 04/05/21   [provider]  montelukast (SINGULAIR) 10 MG tablet Take 10 mg by mouth at bedtime.    [provider]  Multiple Vitamin (MULTIVITAMIN) tablet Take 1 tablet by mouth  daily.    [provider]  omeprazole (PRILOSEC) 40 MG capsule Take 1 capsule by mouth daily. 03/05/21   [provider]  ondansetron (ZOFRAN) 8 MG tablet Take 1 tablet (8 mg total) by mouth every 8 (eight) hours as needed for nausea or vomiting. Patient not taking: No sig reported 03/05/21   Orson Slick, MD  prochlorperazine (COMPAZINE) 10 MG tablet Take 1 tablet (10 mg total) by mouth every 6 (six) hours as needed for nausea or vomiting. Patient not taking: No sig reported 03/05/21   Orson Slick, MD  sildenafil (REVATIO) 20 MG tablet 2-5 tablets one hour prior to sexual intimacy 07/12/19   [provider]  tamsulosin (FLOMAX) 0.4 MG CAPS capsule     [provider]  triamcinolone cream (KENALOG) 0.1 % Apply topically. 01/15/21   [provider]  Zinc 50 MG TABS Take by mouth.    [provider]    Physical Exam: Vitals:   06/15/21 1600 06/15/21 1730 06/15/21 1830 06/15/21 1930  BP: (!) 145/96 (!) 159/91 (!) 154/74 (!) 163/97  Pulse: 68 68 66 66  Resp: 18 20 (!) 22 18  Temp:      TempSrc:      SpO2: 94% 94% 95% 99%  Weight:       Height:        Constitutional: NAD, calm, comfortable Vitals:   06/15/21 1600 06/15/21 1730 06/15/21 1830 06/15/21 1930  BP: (!) 145/96 (!) 159/91 (!) 154/74 (!) 163/97  Pulse: 68 68 66 66  Resp: 18 20 (!) 22 18  Temp:      TempSrc:      SpO2: 94% 94% 95% 99%  Weight:      Height:       General: WDWN, Alert and oriented x3.  Eyes: EOMI, PERRL, conjunctivae normal.  Sclera nonicteric HENT:  [place]/AT, external ears normal.  Nares patent without epistasis.  Mucous membranes are moist. Posterior pharynx clear of any exudate or lesions. Normal dentition.  Neck: Soft, normal range of motion, supple, no masses, Trachea midline Respiratory: Equal breath sounds that are shallow.  Diffuse Rales.  Mild diffuse wheezing with forced expiration, no crackles. Normal respiratory effort. No accessory muscle use.  Cardiovascular: Regular rate and rhythm, no murmurs / rubs / gallops. No extremity edema.  Abdomen: Soft, no tenderness, nondistended, no rebound or guarding.  No masses palpated. Bowel sounds normoactive Musculoskeletal: FROM. no cyanosis. No joint deformity upper and lower extremities. Normal muscle tone.  Skin: Warm, dry, intact no rashes, lesions, ulcers. No induration Neurologic: CN 2-12 grossly intact.  Normal speech.  Sensation intact to touch. Strength 5/5 in all extremities.   Psychiatric: Normal judgment and insight.  Normal mood.    Labs on Admission: I have personally reviewed following labs and imaging studies  CBC: Recent Labs  Lab 06/15/21 1124 06/15/21 1416  WBC 10.1 8.4  NEUTROABS 9.0* 7.7  HGB 12.6* 13.1  HCT 36.3* 38.2*  MCV 90.8 92.5  PLT 203 403    Basic Metabolic Panel: Recent Labs  Lab 06/15/21 1124 06/15/21 1416  NA 127* 132*  K 4.4 4.4  CL 96* 98  CO2 25 25  GLUCOSE 106* 147*  BUN 16 17  CREATININE 1.01 0.87  CALCIUM 8.8* 8.8*    GFR: Estimated Creatinine Clearance: 98.2 mL/min (by C-G formula based on SCr of 0.87 mg/dL).  Liver Function  Tests: Recent Labs  Lab 06/15/21 1124  AST 20  ALT 18  ALKPHOS 64  BILITOT 0.5  PROT 6.9  ALBUMIN 3.3*    Urine analysis: No results found for: COLORURINE, APPEARANCEUR, LABSPEC, PHURINE, GLUCOSEU, HGBUR, BILIRUBINUR, KETONESUR, PROTEINUR, UROBILINOGEN, NITRITE, LEUKOCYTESUR  Radiological Exams on Admission: CT Angio Chest PE W/Cm &/Or Wo Cm  Result Date: 06/15/2021 CLINICAL DATA:  History of multiple myeloma with several days of increasing dyspnea upon exertion. EXAM: CT ANGIOGRAPHY CHEST WITH CONTRAST TECHNIQUE: Multidetector CT imaging of the chest was performed using the standard protocol during bolus administration of intravenous contrast. Multiplanar CT image reconstructions and MIPs were obtained to evaluate the vascular anatomy. CONTRAST:  80m OMNIPAQUE IOHEXOL 350 MG/ML SOLN COMPARISON:  None. FINDINGS: Cardiovascular: There is mild to moderate severity calcification of the aortic arch. The subsegmental pulmonary arteries are limited in evaluation secondary to areas of overlying artifact. No evidence of pulmonary embolism. Normal heart size with moderate severity coronary artery calcification. No pericardial effusion. Mediastinum/Nodes: Mild pretracheal, AP window and bilateral hilar lymphadenopathy is seen. Thyroid gland, trachea, and esophagus demonstrate no significant findings. Lungs/Pleura: Moderate to marked severity diffusely thickened interstitium is noted bilaterally. Mild to moderate severity areas of atelectasis are seen along the periphery of both lungs. There is no evidence of a pleural effusion or pneumothorax. Upper Abdomen: No acute abnormality. Musculoskeletal: Degenerative changes are seen throughout the thoracic spine. Review of the MIP images confirms the above findings. IMPRESSION: 1. Diffuse, chronic appearing interstitial lung disease with associated areas of atelectasis. A superimposed component of pulmonary edema cannot be excluded. 2. No evidence of pulmonary  embolism. 3. Moderate severity coronary artery disease. 4. Aortic atherosclerosis. Aortic Atherosclerosis (ICD10-I70.0). Electronically Signed   By: TVirgina NorfolkM.D.   On: 06/15/2021 17:16   DG Chest Port 1 View  Result Date: 06/15/2021 CLINICAL DATA:  Shortness of breath. EXAM: PORTABLE CHEST 1 VIEW COMPARISON:  05/25/2021 FINDINGS: Bilateral low lung volumes. Diffuse fine interstitial lung densities are again noted. Heart size is grossly stable but accentuated by the low lung volumes. No large areas of consolidation. Negative for a pneumothorax. IMPRESSION: Low lung volumes with persistent diffuse parenchymal lung densities. Lung disease has minimally changed. Electronically Signed   By: AMarkus DaftM.D.   On: 06/15/2021 15:25    EKG: Independently reviewed.  EKG shows normal sinus rhythm with no acute ST elevation or depression.  QTc 449  Assessment/Plan Principal Problem:   Hypoxia Mr. Percifield is admitted to Med-Surg floor. Had decreased O2 sat to 85% with ambulation in ER. Desated to 91% while at rest in Er.  Continue supplemental oxygen to keep O2 92-96%.  Check ABG.  Continue Breo Ellipta that was recently started.  Albuterol MDI with aerochamber  Check PFTs with symptoms and residual lung disease on CT scan.  Active Problems:   Dyspnea Patient reports he has had dyspnea with exertion for many years but is worse in the last few weeks. Is possible is secondary to side effect of chemotherapy.    Mild intermittent asthma Reports he was told he was asthmatic as a child and use some inhaler.  He was recently placed on Breo and albuterol but has not had spirometry or PFT testing he states. Check PFTs    High cholesterol Continue Vytorin.    Multiple myeloma  Being followed by oncology and undergoing chemotherapy.   DVT prophylaxis: Lovenox for DVT prophylaxis.  [place] score elevated with history of multiple myeloma Code Status:   Full code Family Communication:  Diagnosis plan  discussed with patient and his wife.  They verbalized understanding and agree with plan.  Further recommendations to follow as clinical indicated Disposition Plan:   Patient is from:  Home  Anticipated DC to:  Home  Anticipated DC date:  Anticipate 2 midnight or more stay in hospital  Admission status:  Inpatient   Yevonne Aline Amya Hlad MD [facility]  How to contact the [facility] Attending or Consulting provider [place] or covering provider during after hours [place], for this patient?   Check the care team in [facility] and look for a) attending/consulting [facility] provider listed and b) the [facility] team listed Log into [email or web address] and use [facility] universal password to access. If you do not have the password, please contact the hospital operator. Locate the [facility] provider you are looking for under [facility] and page to a number that you can be directly reached. If you still have difficulty reaching the provider, please page the [facility] (Director on Call) for the Hospitalists listed on amion for assistance.  06/15/2021, 8:09 PM

## 2021-06-16 ENCOUNTER — Inpatient Hospital Stay (HOSPITAL_COMMUNITY): Payer: Managed Care, Other (non HMO)

## 2021-06-16 DIAGNOSIS — R0902 Hypoxemia: Secondary | ICD-10-CM

## 2021-06-16 DIAGNOSIS — R0602 Shortness of breath: Secondary | ICD-10-CM

## 2021-06-16 LAB — ECHOCARDIOGRAM COMPLETE
AR max vel: 2.26 cm2
AV Area VTI: 2.02 cm2
AV Area mean vel: 2.06 cm2
AV Mean grad: 6.3 mmHg
AV Peak grad: 11.5 mmHg
Ao pk vel: 1.69 m/s
Area-P 1/2: 3.08 cm2
Calc EF: 74.3 %
Height: 70 in
S' Lateral: 2.7 cm
Single Plane A2C EF: 75.6 %
Single Plane A4C EF: 74.4 %
Weight: 3428.59 oz

## 2021-06-16 LAB — CBC
HCT: 35.9 % — ABNORMAL LOW (ref 39.0–52.0)
Hemoglobin: 12.3 g/dL — ABNORMAL LOW (ref 13.0–17.0)
MCH: 31.5 pg (ref 26.0–34.0)
MCHC: 34.3 g/dL (ref 30.0–36.0)
MCV: 91.8 fL (ref 80.0–100.0)
Platelets: 238 10*3/uL (ref 150–400)
RBC: 3.91 MIL/uL — ABNORMAL LOW (ref 4.22–5.81)
RDW: 14.8 % (ref 11.5–15.5)
WBC: 10.9 10*3/uL — ABNORMAL HIGH (ref 4.0–10.5)
nRBC: 0 % (ref 0.0–0.2)

## 2021-06-16 LAB — SARS CORONAVIRUS 2 (TAT 6-24 HRS): SARS Coronavirus 2: NEGATIVE

## 2021-06-16 LAB — BASIC METABOLIC PANEL
Anion gap: 9 (ref 5–15)
BUN: 15 mg/dL (ref 8–23)
CO2: 25 mmol/L (ref 22–32)
Calcium: 8.7 mg/dL — ABNORMAL LOW (ref 8.9–10.3)
Chloride: 101 mmol/L (ref 98–111)
Creatinine, Ser: 0.66 mg/dL (ref 0.61–1.24)
GFR, Estimated: >60 mL/min (ref >60)
Glucose, Bld: 128 mg/dL — ABNORMAL HIGH (ref 70–99)
Potassium: 4.1 mmol/L (ref 3.5–5.1)
Sodium: 135 mmol/L (ref 135–145)

## 2021-06-16 LAB — HIV ANTIBODY (ROUTINE TESTING W REFLEX): HIV Screen 4th Generation wRfx: NONREACTIVE

## 2021-06-16 MED ORDER — TAMSULOSIN HCL 0.4 MG PO CAPS
0.4000 mg | ORAL_CAPSULE | Freq: Every day | ORAL | Status: DC
  Administered 2021-06-16 – 2021-06-18 (×3): 0.4 mg via ORAL
  Filled 2021-06-16 (×3): qty 1

## 2021-06-16 MED ORDER — ACYCLOVIR 400 MG PO TABS
400.0000 mg | ORAL_TABLET | Freq: Two times a day (BID) | ORAL | Status: DC
  Administered 2021-06-16 – 2021-06-18 (×5): 400 mg via ORAL
  Filled 2021-06-16 (×5): qty 1

## 2021-06-16 MED ORDER — ALPRAZOLAM 1 MG PO TABS: 1.0000 mg | ORAL_TABLET | Freq: Two times a day (BID) | ORAL | Status: DC | PRN

## 2021-06-16 MED ORDER — ALLOPURINOL 300 MG PO TABS
300.0000 mg | ORAL_TABLET | Freq: Every day | ORAL | Status: DC
  Administered 2021-06-16 – 2021-06-18 (×3): 300 mg via ORAL
  Filled 2021-06-16 (×3): qty 1

## 2021-06-16 MED ORDER — METHYLPREDNISOLONE SODIUM SUCC 125 MG IJ SOLR
100.0000 mg | INTRAMUSCULAR | Status: AC
Start: 2021-06-16 — End: 2021-06-18
  Administered 2021-06-16 – 2021-06-18 (×3): 100 mg via INTRAVENOUS
  Filled 2021-06-16 (×3): qty 2

## 2021-06-16 NOTE — Progress Notes (Signed)
  Echocardiogram 2D Echocardiogram has been performed.  Samuel [name] 06/16/2021, 10:10 AM

## 2021-06-16 NOTE — H&P (View-Only) (Signed)
NAME:  Samuel [name], MRN:  [id number], DOB:  [birth year], LOS: 1 ADMISSION DATE:  06/15/2021, CONSULTATION DATE:  8/27 REFERRING MD:  Evette Doffing, CHIEF COMPLAINT:  dyspnea   History of Present Illness:  65 y/o male with a history of multiple myeloma diagnosed in 2022, currently undergoing treatment presented to the [facility] emergency department from his hematology oncology appointment with a chief complaint of dyspnea.  The patient smoked cigarettes briefly as a young adult but since then has lived a relatively healthy lifestyle staying active with exercise and playing golf.  He was diagnosed with multiple myeloma in the spring 2022 when a bruise was discovered while he was receiving a sports massage.  He went for further work-up and was found to have abnormal lab work, this led to a bone marrow biopsy confirming diagnosis of multiple myeloma.  He has been followed by Dr. [name] who has been treating him with VRd chemotherapy since Mar 16, 2021.  He is currently in the middle of his fifth cycle.    Over the last several weeks he has been developing progressive shortness of breath with exertion.  He says this has been going on for about 3 to 4 weeks and has significantly worsened in the last week.  This is associated with a dry, rare cough.  He denies fevers, chills, leg swelling, weight gain, or mucus production.  In the last several days (just prior to admission) [place] felt significant shortness of breath so he presented to Dr. Libby Maw office for further evaluation.  He was sent to the emergency room for evaluation further because of the severity of his shortness of breath.  In the emergency department he was noted to have hypoxemia to an O2 saturation of 87% and an abnormal chest exam.  Pulmonary and critical care medicine was consulted for further evaluation.  He says that throughout his adult life he has had what he calls "huffing" when he talks while exerting  himself or with exertion.  He says he has been followed by an allergist and has been treated for asthma at times over the years.  He does not regularly use an inhaler and he says this is not something that he routinely needs.  He was seen by my partner Dr. [name] at 1 point in 2013 during which time he had a normal chest x-ray and normal lung function testing.  He had COVID in April 2022.    Pertinent  Medical History  Asthma, allergic rhinitis Hypercholesterolemia History of "thyroid disease", not otherwise specified  Significant Hospital Events: Including procedures, antibiotic start and stop dates in addition to other pertinent events   June 15, 2021 admission August 26 CT chest images independently reviewed: No pulmonary embolism, diffuse interlobular septal thickening, pleural nodularity noted, areas of groundglass opacification with some patchy distribution particularly in the lung base, no significant pleural effusion, mild reactive appearing lymphadenopathy in the mediastinum  Interim History / Subjective:  As above  Objective   Blood pressure (!) 150/62, pulse 66, temperature 97.7 F (36.5 C), temperature source Oral, resp. rate 19, height 5' 10" (1.778 m), weight 97.2 kg, SpO2 96 %.        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  Based on the time course of his illness and reports in the literature I am most concerned about [name] lung toxicity (see citation below), though it is also important to evaluate for another underlying and progressive primary pulmonary ILD or less likely an opportunistic infection (history doesn't seem to support this).  [name] can cause eosinophilic pneumonia which we can assess for with a bronchoscopy, but his imaging characteristics aren't typical for this.  It is possible that the non-specific interstitial changes seen on his lungs in April 2022 were related to his recent COVID infection.  I don't think his baseline asthma explains his symptoms, though he did have a high serum eosinophil count a few weeks prior to admission (unclear significance).  > plan bronchoscopy with BAL and transbronchial biopsy on Monday > given lack of infectious symptoms I don't see a good reason for antibiotics at this time > keep NPO after midnight on 8/28 > will start solumedrol, 50mg q12h (rougly 1m/kg) > continue to wean off O2 for O2 saturation > 88%  Saglam B, Kalyon H, Ozbalak M, Ornek S, Keske S, Tabak L, Cakar N, Zeren H, Aytekin S, Bolukbasi Y, Ferhanoglu B. Bortezomib induced pulmonary toxicity: a case report and review of the literature. Am J Blood Res. 2020 Dec 15;10(6):407-415. PMID: 33489450; PMCID: [id number].  Best Practice (right click and "Reselect all SmartList Selections" daily)   Per [facility]  Labs   CBC: Recent Labs  Lab 06/15/21 1124 06/15/21 1416 06/16/21 0727  WBC 10.1 8.4 10.9*  NEUTROABS 9.0* 7.7  --   HGB 12.6* 13.1 12.3*  HCT 36.3* 38.2* 35.9*  MCV 90.8 92.5 91.8  PLT 203 216 238    Basic Metabolic Panel: Recent Labs  Lab 06/15/21 1124 06/15/21 1416 06/16/21 0727  NA 127* 132* 135   K 4.4 4.4 4.1  CL 96* 98 101  CO2 25 25 25  GLUCOSE 106* 147* 128*  BUN 16 17 15  CREATININE 1.01 0.87 0.66  CALCIUM 8.8* 8.8* 8.7*   GFR: Estimated Creatinine Clearance: 109.1 mL/min (by C-G formula based on SCr of 0.66 mg/dL). Recent Labs  Lab 06/15/21 1124 06/15/21 1416 06/16/21 0727  WBC 10.1 8.4 10.9*    Liver Function Tests: Recent Labs  Lab 06/15/21 1124  AST 20  ALT 18  ALKPHOS 64  BILITOT 0.5  PROT 6.9  ALBUMIN 3.3*   No results for input(s): LIPASE, AMYLASE in the last 168 hours. No results for input(s): AMMONIA in the last 168 hours.  ABG    Component Value Date/Time   PHART 7.445 06/15/2021 2045   PCO2ART 34.0 06/15/2021 2045   PO2ART 96.6 06/15/2021 2045   HCO3 23.0 06/15/2021 2045   O2SAT 97.6 06/15/2021 2045     Coagulation Profile: No results for input(s): INR, PROTIME in the last 168 hours.  Cardiac   Enzymes: No results for input(s): CKTOTAL, CKMB, CKMBINDEX, TROPONINI in the last 168 hours.  HbA1C: No results found for: HGBA1C  CBG: No results for input(s): GLUCAP in the last 168 hours.  Review of Systems:   Gen: Denies fever, chills, weight change, fatigue, night sweats HEENT: Denies blurred vision, double vision, hearing loss, tinnitus, sinus congestion, rhinorrhea, sore throat, neck stiffness, dysphagia PULM: per HPI CV: Denies chest pain, edema, orthopnea, paroxysmal nocturnal dyspnea, palpitations GI: Denies abdominal pain, nausea, vomiting, diarrhea, hematochezia, melena, constipation, change in bowel habits GU: Denies dysuria, hematuria, polyuria, oliguria, urethral discharge Endocrine: Denies hot or cold intolerance, polyuria, polyphagia or appetite change Derm: Denies rash, dry skin, scaling or peeling skin change Heme: Denies easy bruising, bleeding, bleeding gums Neuro: Denies headache, numbness, weakness, slurred speech, loss of memory or consciousness   Past Medical History:  He,  has a past medical history of Asthma,  High cholesterol, Perennial allergic rhinitis, Sciatic pain, right, Seasonal allergic rhinitis, and Thyroid disease.   Surgical History:   Past Surgical History:  Procedure Laterality Date   NASAL SINUS SURGERY     NECK SURGERY  1996     Social History:   reports that he quit smoking about 38 years ago. He has a 0.50 pack-year smoking history. He has never used smokeless tobacco. He reports current alcohol use. He reports that he does not use drugs.   Family History:  His family history includes Allergies in his father and mother; Breast cancer in his paternal grandmother; Heart disease in an other family member; Hypertension in an other family member.   Allergies Allergies  Allergen Reactions   Clarithromycin    Penicillins      Home Medications  Prior to Admission medications   Medication Sig Start Date End Date Taking? Authorizing Provider  acyclovir (ZOVIRAX) 400 MG tablet Take 1 tablet (400 mg total) by mouth 2 (two) times daily. 03/05/21  Yes Orson Slick, MD  albuterol (VENTOLIN HFA) 108 (90 Base) MCG/ACT inhaler Inhale 2 puffs into the lungs every 6 (six) hours as needed for wheezing or shortness of breath. 05/28/21  Yes Orson Slick, MD  allopurinol (ZYLOPRIM) 300 MG tablet Take 300 mg by mouth daily. 03/05/21  Yes [provider]  ALPRAZolam Duanne Moron) 1 MG tablet Take 1 mg by mouth 2 (two) times daily as needed for anxiety.   Yes [provider]  aspirin EC 81 MG tablet Take 81 mg by mouth daily. Swallow whole.   Yes [provider]  [place] as directed. 07/15/11  Yes [provider]  BREO ELLIPTA 200-25 MCG/INH AEPB Inhale 1 puff into the lungs daily. 05/31/21  Yes [provider]  cholecalciferol (VITAMIN D) 1000 UNITS tablet Take 3,000 Units by mouth daily.   Yes [provider]  dexamethasone (DECADRON) 4 MG tablet Take 10 tablets (40 mg total) by mouth once a week. Patient taking  differently: Take 20 mg by mouth once a week. Friday 03/05/21  Yes Orson Slick, MD  EPINEPHrine (EPI-PEN) 0.3 mg/0.3 mL DEVI Inject 0.3 mg into the muscle once as needed.   Yes [provider]  ezetimibe-simvastatin (VYTORIN) 10-40 MG per tablet Take 1 tablet by mouth at bedtime.   Yes [provider]  furosemide (LASIX) 20 MG tablet Take 1 tablet (20 mg total) by mouth daily. 06/11/21  Yes Orson Slick, MD  levothyroxine (SYNTHROID) 150 MCG tablet Take 150 mcg by  mouth every other day. Alternates with 175 mcg   Yes [provider]  levothyroxine (SYNTHROID) 175 MCG tablet Take 1 tablet by mouth every other day. 04/05/21  Yes [provider]  montelukast (SINGULAIR) 10 MG tablet Take 10 mg by mouth at bedtime.   Yes [provider]  Multiple Vitamin (MULTIVITAMIN) tablet Take 1 tablet by mouth daily.   Yes [provider]  omeprazole (PRILOSEC) 40 MG capsule Take 1 capsule by mouth daily. 03/05/21  Yes [provider]  ondansetron (ZOFRAN) 8 MG tablet Take 1 tablet (8 mg total) by mouth every 8 (eight) hours as needed for nausea or vomiting. 03/05/21  Yes Orson Slick, MD  prochlorperazine (COMPAZINE) 10 MG tablet Take 1 tablet (10 mg total) by mouth every 6 (six) hours as needed for nausea or vomiting. 03/05/21  Yes Orson Slick, MD  sildenafil (REVATIO) 20 MG tablet 2-5 tablets one hour prior to sexual intimacy 07/12/19  Yes [provider]  tamsulosin (FLOMAX) 0.4 MG CAPS capsule Take 0.4 mg by mouth daily.   Yes [provider]  triamcinolone cream (KENALOG) 0.1 % Apply topically. 01/15/21  Yes [provider]  Zinc 50 MG TABS Take by mouth.   Yes [provider]  lenalidomide ([name]) 25 MG capsule TAKE 1 CAPSULE DAILY FOR 14 DAYS ON, 7 DAYS OFF, REPEAT EVERY 21 DAYS Celgene Josem Kaufmann # [id number]  Date : 05/29/21 05/29/21   Orson Slick, MD     Critical care time: n/a    Roselie Awkward, MD [place] [facility] Pager: [phone] Cell: [phone] After 7:00 pm call Elink  [phone]

## 2021-06-16 NOTE — Plan of Care (Signed)
  Problem: Education: Goal: Knowledge of General Education information will improve Description: Including pain rating scale, medication(s)/side effects and non-pharmacologic comfort measures Outcome: Progressing   Problem: Health Behavior/Discharge Planning: Goal: Ability to manage health-related needs will improve Outcome: Progressing   Problem: Clinical Measurements: Goal: Ability to maintain clinical measurements within normal limits will improve Outcome: Progressing Goal: Will remain free from infection Outcome: Progressing Goal: Diagnostic test results will improve Outcome: Progressing Goal: Respiratory complications will improve Outcome: Progressing Goal: Cardiovascular complication will be avoided Outcome: Progressing   Problem: Activity: Goal: Risk for activity intolerance will decrease Outcome: Progressing   Problem: Nutrition: Goal: Adequate nutrition will be maintained Outcome: Progressing   Problem: Safety: Goal: Ability to remain free from injury will improve Outcome: Progressing   

## 2021-06-16 NOTE — Consult Note (Addendum)
NAME:  Samuel [name], MRN:  [id number], DOB:  10-23-1955, LOS: 1 ADMISSION DATE:  06/15/2021, CONSULTATION DATE:  8/27 REFERRING MD:  Evette Doffing, CHIEF COMPLAINT:  dyspnea   History of Present Illness:  65 y/o male with a history of multiple myeloma diagnosed in 2022, currently undergoing treatment presented to the [facility] emergency department from his hematology oncology appointment with a chief complaint of dyspnea.  The patient smoked cigarettes briefly as a young adult but since then has lived a relatively healthy lifestyle staying active with exercise and playing golf.  He was diagnosed with multiple myeloma in the spring 2022 when a bruise was discovered while he was receiving a sports massage.  He went for further work-up and was found to have abnormal lab work, this led to a bone marrow biopsy confirming diagnosis of multiple myeloma.  He has been followed by Dr. [name] who has been treating him with VRd chemotherapy since Mar 16, 2021.  He is currently in the middle of his fifth cycle.    Over the last several weeks he has been developing progressive shortness of breath with exertion.  He says this has been going on for about 3 to 4 weeks and has significantly worsened in the last week.  This is associated with a dry, rare cough.  He denies fevers, chills, leg swelling, weight gain, or mucus production.  In the last several days (just prior to admission) [place] and while there felt significant shortness of breath so he presented to Dr. Libby Maw office for further evaluation.  He was sent to the emergency room for evaluation further because of the severity of his shortness of breath.  In the emergency department he was noted to have hypoxemia to an O2 saturation of 87% and an abnormal chest exam.  Pulmonary and critical care medicine was consulted for further evaluation.  He says that throughout his adult life he has had what he calls "huffing" when he talks while exerting  himself or with exertion.  He says he has been followed by an allergist and has been treated for asthma at times over the years.  He does not regularly use an inhaler and he says this is not something that he routinely needs.  He was seen by my partner Dr. [name] at 1 point in 2013 during which time he had a normal chest x-ray and normal lung function testing.  He had COVID in April 2022.    Pertinent  Medical History  Asthma, allergic rhinitis Hypercholesterolemia History of "thyroid disease", not otherwise specified  Significant Hospital Events: Including procedures, antibiotic start and stop dates in addition to other pertinent events   June 15, 2021 admission August 26 CT chest images independently reviewed: No pulmonary embolism, diffuse interlobular septal thickening, pleural nodularity noted, areas of groundglass opacification with some patchy distribution particularly in the lung base, no significant pleural effusion, mild reactive appearing lymphadenopathy in the mediastinum  Interim History / Subjective:  As above  Objective   Blood pressure (!) 150/62, pulse 66, temperature 97.7 F (36.5 C), temperature source Oral, resp. rate 19, height 5' 10" (1.778 m), weight 97.2 kg, SpO2 96 %.        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  Based on the time course of his illness and reports in the literature I am most concerned about [name] lung toxicity (see citation below), though it is also important to evaluate for another underlying and progressive primary pulmonary ILD or less likely an opportunistic infection (history doesn't seem to support this).  [name] can cause eosinophilic pneumonia which we can assess for with a bronchoscopy, but his imaging characteristics aren't typical for this.  It is possible that the non-specific interstitial changes seen on his lungs in April 2022 were related to his recent COVID infection.  I don't think his baseline asthma explains his symptoms, though he did have a high serum eosinophil count a few weeks prior to admission (unclear significance).  > plan bronchoscopy with BAL and transbronchial biopsy on Monday > given lack of infectious symptoms I don't see a good reason for antibiotics at this time > keep NPO after midnight on 8/28 > will start solumedrol, 53m q12h (rougly 160mg) > continue to wean off O2 for O2 saturation > 88%  Saglam B, Kalyon H, Ozbalak M, Ornek S, Keske S, Tabak L, Cakar N, Zeren H, Aytekin S, [place]. Bortezomib induced pulmonary toxicity: a case report and review of the literature. Am J Blood Res. 2020 Dec 15;10(6):407-415. PMID: [id number]: : [id number] Best Practice (right click and "Reselect all SmartList Selections" daily)   Per [facility]  Labs   CBC: Recent Labs  Lab 06/15/21 1124 06/15/21 1416 06/16/21 0727  WBC 10.1 8.4 10.9*  NEUTROABS 9.0* 7.7  --   HGB 12.6* 13.1 12.3*  HCT 36.3* 38.2* 35.9*  MCV 90.8 92.5 91.8  PLT 203 216 23945  Basic Metabolic Panel: Recent Labs  Lab 06/15/21 1124 06/15/21 1416 06/16/21 0727  NA 127* 132* 135   K 4.4 4.4 4.1  CL 96* 98 101  CO2 _0 GLUCOSE 106* 147* 128*  BUN _1 CREATININE 1.01 0.87 0.66  CALCIUM 8.8* 8.8* 8.7*   GFR: Estimated Creatinine Clearance: 109.1 mL/min (by C-G formula based on SCr of 0.66 mg/dL). Recent Labs  Lab 06/15/21 1124 06/15/21 1416 06/16/21 0727  WBC 10.1 8.4 10.9*    Liver Function Tests: Recent Labs  Lab 06/15/21 1124  AST 20  ALT 18  ALKPHOS 64  BILITOT 0.5  PROT 6.9  ALBUMIN 3.3*   No results for input(s): LIPASE, AMYLASE in the last 168 hours. No results for input(s): AMMONIA in the last 168 hours.  ABG    Component Value Date/Time   PHART 7.445 06/15/2021 2045   PCO2ART 34.0 06/15/2021 2045   PO2ART 96.6 06/15/2021 2045   HCO3 23.0 06/15/2021 2045   O2SAT 97.6 06/15/2021 2045     Coagulation Profile: No results for input(s): INR, PROTIME in the last 168 hours.  Cardiac  Enzymes: No results for input(s): CKTOTAL, CKMB, CKMBINDEX, TROPONINI in the last 168 hours.  HbA1C: No results found for: HGBA1C  CBG: No results for input(s): GLUCAP in the last 168 hours.  Review of Systems:   Gen: Denies fever, chills, weight change, fatigue, night sweats HEENT: Denies blurred vision, double vision, hearing loss, tinnitus, sinus congestion, rhinorrhea, sore throat, neck stiffness, dysphagia PULM: per HPI CV: Denies chest pain, edema, orthopnea, paroxysmal nocturnal dyspnea, palpitations GI: Denies abdominal pain, nausea, vomiting, diarrhea, hematochezia, melena, constipation, change in bowel habits GU: Denies dysuria, hematuria, polyuria, oliguria, urethral discharge Endocrine: Denies hot or cold intolerance, polyuria, polyphagia or appetite change Derm: Denies rash, dry skin, scaling or peeling skin change Heme: Denies easy bruising, bleeding, bleeding gums Neuro: Denies headache, numbness, weakness, slurred speech, loss of memory or consciousness   Past Medical History:  He,  has a past medical history of Asthma,  High cholesterol, Perennial allergic rhinitis, Sciatic pain, right, Seasonal allergic rhinitis, and Thyroid disease.   Surgical History:   Past Surgical History:  Procedure Laterality Date   NASAL SINUS SURGERY     NECK SURGERY  1996     Social History:   reports that he quit smoking about 38 years ago. He has a 0.50 pack-year smoking history. He has never used smokeless tobacco. He reports current alcohol use. He reports that he does not use drugs.   Family History:  His family history includes Allergies in his father and mother; Breast cancer in his paternal grandmother; Heart disease in an other family member; Hypertension in an other family member.   Allergies Allergies  Allergen Reactions   Clarithromycin    Penicillins      Home Medications  Prior to Admission medications   Medication Sig Start Date End Date Taking? Authorizing Provider  acyclovir (ZOVIRAX) 400 MG tablet Take 1 tablet (400 mg total) by mouth 2 (two) times daily. 03/05/21  Yes Orson Slick, MD  albuterol (VENTOLIN HFA) 108 (90 Base) MCG/ACT inhaler Inhale 2 puffs into the lungs every 6 (six) hours as needed for wheezing or shortness of breath. 05/28/21  Yes Orson Slick, MD  allopurinol (ZYLOPRIM) 300 MG tablet Take 300 mg by mouth daily. 03/05/21  Yes [provider]  ALPRAZolam Duanne Moron) 1 MG tablet Take 1 mg by mouth 2 (two) times daily as needed for anxiety.   Yes [provider]  aspirin EC 81 MG tablet Take 81 mg by mouth daily. Swallow whole.   Yes [provider]  [place] as directed. 07/15/11  Yes [provider]  BREO ELLIPTA 200-25 MCG/INH AEPB Inhale 1 puff into the lungs daily. 05/31/21  Yes [provider]  cholecalciferol (VITAMIN D) 1000 UNITS tablet Take 3,000 Units by mouth daily.   Yes [provider]  dexamethasone (DECADRON) 4 MG tablet Take 10 tablets (40 mg total) by mouth once a week. Patient taking  differently: Take 20 mg by mouth once a week. Friday 03/05/21  Yes Orson Slick, MD  EPINEPHrine (EPI-PEN) 0.3 mg/0.3 mL DEVI Inject 0.3 mg into the muscle once as needed.   Yes [provider]  ezetimibe-simvastatin (VYTORIN) 10-40 MG per tablet Take 1 tablet by mouth at bedtime.   Yes [provider]  furosemide (LASIX) 20 MG tablet Take 1 tablet (20 mg total) by mouth daily. 06/11/21  Yes Orson Slick, MD  levothyroxine (SYNTHROID) 150 MCG tablet Take 150 mcg by  mouth every other day. Alternates with 175 mcg   Yes [provider]  levothyroxine (SYNTHROID) 175 MCG tablet Take 1 tablet by mouth every other day. 04/05/21  Yes [provider]  montelukast (SINGULAIR) 10 MG tablet Take 10 mg by mouth at bedtime.   Yes [provider]  Multiple Vitamin (MULTIVITAMIN) tablet Take 1 tablet by mouth daily.   Yes [provider]  omeprazole (PRILOSEC) 40 MG capsule Take 1 capsule by mouth daily. 03/05/21  Yes [provider]  ondansetron (ZOFRAN) 8 MG tablet Take 1 tablet (8 mg total) by mouth every 8 (eight) hours as needed for nausea or vomiting. 03/05/21  Yes Orson Slick, MD  prochlorperazine (COMPAZINE) 10 MG tablet Take 1 tablet (10 mg total) by mouth every 6 (six) hours as needed for nausea or vomiting. 03/05/21  Yes Orson Slick, MD  sildenafil (REVATIO) 20 MG tablet 2-5 tablets one hour prior to sexual intimacy 07/12/19  Yes [provider]  tamsulosin (FLOMAX) 0.4 MG CAPS capsule Take 0.4 mg by mouth daily.   Yes [provider]  triamcinolone cream (KENALOG) 0.1 % Apply topically. 01/15/21  Yes [provider]  Zinc 50 MG TABS Take by mouth.   Yes [provider]  lenalidomide ([name]) 25 MG capsule TAKE 1 CAPSULE DAILY FOR 14 DAYS ON, 7 DAYS OFF, REPEAT EVERY 21 DAYS Celgene Josem Kaufmann # [id number]  Date : 05/29/21 05/29/21   Orson Slick, MD     Critical care time: n/a    Roselie Awkward, MD [place] [facility] Pager: [phone] Cell: [phone] After 7:00 pm call Elink  [phone]

## 2021-06-16 NOTE — Progress Notes (Addendum)
Samuel NOTE    BARON PARMELEE  GGY:[id number] DOB: 05-22-1956 DOA: 06/15/2021 PCP: Antony Contras, MD     Brief Narrative:  Samuel [name] is a 65 y.o. male with medical history significant for HLD, hypothyroidism, multiple myeloma followed by Oncology, mild intermittent asthma who presents for evaluation of SOB that is worsened with exertion.  He reports he has always had some shortness of breath when he exerts himself that generally in improves as he continues activities or exercise.  He states in the last few weeks has become more pronounced and has had more shortness of breath at rest.  When he arrived to the emergency room he had oxygen saturations in the mid 80s on room air with walking.  He has not had any fever or chills.  States he was told that asthma was a child and used an inhaler briefly when a child but had no treatment in his early adulthood until now.  He is reports a few weeks ago he was placed on albuterol and Breo due to his shortness of breath. He reports he does have a history of edema and takes Lasix intermittently but states he has never been told he had cardiac disease.  He states his father died of coronary artery disease.  He reports he has always had shortness of breath and feels like he has to try to take a big deep breath but feels constricting his chest when he first tries exercising but improves as he continues to do his activity.    New events last 24 hours / Subjective: Patient states that he has been short of breath all of his life, even as a child.  He had asthma as a child, had a couple of years where he had asthma attacks in his 10s.  He smoked tobacco briefly but has quit for decades now.  Denies any other drug use, vaping, e-cigarettes.  He describes dyspnea with exertion that has always been present, but would improve with physical activity.  He has been very physically active in his adult life, working with physical trainers, golfing.  However in the last couple  of weeks, his shortness of breath has progressed.  Dr. [name] with oncology who manages his multiple myeloma has initially tried Lasix 20 mg daily.  Despite urine output, this has not helped with his shortness of breath.  He denies any orthopnea or significant snoring.  He presented to Dr. Libby Maw office on 8/26 and was found to be satting in the 80s on room air and was sent to the emergency room for further evaluation.  He denies any fevers, chills, significant cough, nausea, vomiting, diarrhea or abdominal pain.  He feels well at rest, but states that he is not able to take more than a few steps before feeling short of breath.  Denies any wheezing.  He lives at home with wife, denies any recent international travel (went to the mountains in [place] recently).  He used to work as a Music therapist in an office setting, denies any significant work in [employer] or farm.  They own a small hypoallergenic dog, no other pets.  He has never experienced homelessness, never been imprisoned.  He has no cardiac disease that he is aware of other than hyperlipidemia.  He denies any significant chest pain.  He does not use oxygen at baseline.  He does admit to seasonal allergies, sees an allergist.  Assessment & Plan:   Principal Problem:   Hypoxia Active Problems:  Mild intermittent asthma   High cholesterol   Multiple myeloma (HCC)   Dyspnea  Acute hypoxemic respiratory failure -Found to be satting in the mid 80s on room air -Continue nasal cannula O2 and wean to room air as able -Echocardiogram without significant evidence of heart failure.  EF is 60%.  BNP 32. -Unclear etiology, CTA chest did reveal underlying interstitial lung disease. Possibly related to chemotherapy.  He is on bortezomib and [name] which was started in May 2022.  However, his shortness of breath and dyspnea on exertion preceded his treatment -Pulmonology consulted today  Multiple myeloma -Followed by Dr. [name] as an  outpatient, currently undergoing chemotherapy -Continue acyclovir  Hyperlipidemia -Continue Vytorin  Anxiety -Continue Xanax as needed  BPH -Continue Flomax  Hypothyroidism -Continue Synthroid    DVT prophylaxis:  enoxaparin (LOVENOX) injection 40 mg Start: 06/15/21 2300  Code Status:     Code Status Orders  (From admission, onward)           Start     Ordered   06/15/21 2213  Full code  Continuous        06/15/21 2212           Code Status History     This patient has a current code status but no historical code status.      Advance Directive Documentation    Flowsheet Row Most Recent Value  Type of Advance Directive Healthcare Power of Attorney  Pre-existing out of facility DNR order (yellow form or pink MOST form) --  "MOST" Form in Place? --      Family Communication: Wife at bedside Disposition Plan:  Status is: Inpatient  Remains inpatient appropriate because:Ongoing diagnostic testing needed not appropriate for outpatient work up and Inpatient level of care appropriate due to severity of illness  Dispo: The patient is from: Home              Anticipated d/c is to: Home              Patient currently is not medically stable to d/c.   Difficult to place patient No      Consultants:  Pulmonology   Antimicrobials:  Anti-infectives (From admission, onward)    None        Objective: Vitals:   06/16/21 0743 06/16/21 1015 06/16/21 1119 06/16/21 1124  BP:  (!) 148/78    Pulse:  69    Resp:  19    Temp:  (!) 97.5 F (36.4 C)    TempSrc:  Oral    SpO2: 97% 96% 96% 96%  Weight:      Height:        Intake/Output Summary (Last 24 hours) at 06/16/2021 1216 Last data filed at 06/16/2021 1024 Gross per 24 hour  Intake 300 ml  Output 1000 ml  Net -700 ml   Filed Weights   06/15/21 1408 06/15/21 2206  Weight: 96.1 kg 97.2 kg    Examination:  General exam: Appears calm and comfortable  Respiratory system: Fine crackles  throughout.  Respiratory effort is normal, no respiratory distress at rest.  No conversational dyspnea Cardiovascular system: S1 & S2 heard, RRR. No murmurs. No pedal edema. Gastrointestinal system: Abdomen is nondistended, soft and nontender. Normal bowel sounds heard. Central nervous system: Alert and oriented. No focal neurological deficits. Speech clear.  Extremities: Symmetric in appearance  Skin: No rashes, lesions or ulcers on exposed skin  Psychiatry: Judgement and insight appear normal. Mood & affect appropriate.  Data Reviewed: I have personally reviewed following labs and imaging studies  CBC: Recent Labs  Lab 06/15/21 1124 06/15/21 1416 06/16/21 0727  WBC 10.1 8.4 10.9*  NEUTROABS 9.0* 7.7  --   HGB 12.6* 13.1 12.3*  HCT 36.3* 38.2* 35.9*  MCV 90.8 92.5 91.8  PLT 203 216 235   Basic Metabolic Panel: Recent Labs  Lab 06/15/21 1124 06/15/21 1416 06/16/21 0727  NA 127* 132* 135  K 4.4 4.4 4.1  CL 96* 98 101  CO2 _0 GLUCOSE 106* 147* 128*  BUN _1 CREATININE 1.01 0.87 0.66  CALCIUM 8.8* 8.8* 8.7*   GFR: Estimated Creatinine Clearance: 109.1 mL/min (by C-G formula based on SCr of 0.66 mg/dL). Liver Function Tests: Recent Labs  Lab 06/15/21 1124  AST 20  ALT 18  ALKPHOS 64  BILITOT 0.5  PROT 6.9  ALBUMIN 3.3*   No results for input(s): LIPASE, AMYLASE in the last 168 hours. No results for input(s): AMMONIA in the last 168 hours. Coagulation Profile: No results for input(s): INR, PROTIME in the last 168 hours. Cardiac Enzymes: No results for input(s): CKTOTAL, CKMB, CKMBINDEX, TROPONINI in the last 168 hours. BNP (last 3 results) No results for input(s): PROBNP in the last 8760 hours. HbA1C: No results for input(s): HGBA1C in the last 72 hours. CBG: No results for input(s): GLUCAP in the last 168 hours. Lipid Profile: No results for input(s): CHOL, HDL, LDLCALC, TRIG, CHOLHDL, LDLDIRECT in the last 72 hours. Thyroid Function  Tests: No results for input(s): TSH, T4TOTAL, FREET4, T3FREE, THYROIDAB in the last 72 hours. Anemia Panel: No results for input(s): VITAMINB12, FOLATE, FERRITIN, TIBC, IRON, RETICCTPCT in the last 72 hours. Sepsis Labs: No results for input(s): PROCALCITON, LATICACIDVEN in the last 168 hours.  Recent Results (from the past 240 hour(s))  SARS CORONAVIRUS 2 (TAT 6-24 HRS) Nasopharyngeal Nasopharyngeal Swab     Status: None   Collection Time: 06/15/21  8:08 PM   Specimen: Nasopharyngeal Swab  Result Value Ref Range Status   SARS Coronavirus 2 NEGATIVE NEGATIVE Final    Comment: (NOTE) SARS-CoV-2 target nucleic acids are NOT DETECTED.  The SARS-CoV-2 RNA is generally detectable in upper and lower respiratory specimens during the acute phase of infection. Negative results do not preclude SARS-CoV-2 infection, do not rule out co-infections with other pathogens, and should not be used as the sole basis for treatment or other patient management decisions. Negative results must be combined with clinical observations, patient history, and epidemiological information. The expected result is Negative.  Fact Sheet for Patients: SugarRoll.be  Fact Sheet for Healthcare Providers: https://www.woods-mathews.com/  This test is not yet approved or cleared by the [place] FDA and  has been authorized for detection and/or diagnosis of SARS-CoV-2 by FDA under an Emergency Use Authorization (EUA). This EUA will remain  in effect (meaning this test can be used) for the duration of the COVID-19 declaration under Se ction 564(b)(1) of the Act, 21 U.S.C. section 360bbb-3(b)(1), unless the authorization is terminated or revoked sooner.  Performed at [facility], [place] [address], [place], [place] [address]       Radiology Studies: CT Angio Chest PE W/Cm &/Or Wo Cm  Result Date: 06/15/2021 CLINICAL DATA:  History of multiple myeloma with several  days of increasing dyspnea upon exertion. EXAM: CT ANGIOGRAPHY CHEST WITH CONTRAST TECHNIQUE: Multidetector CT imaging of the chest was performed using the standard protocol during bolus administration of intravenous contrast. Multiplanar CT image  reconstructions and MIPs were obtained to evaluate the vascular anatomy. CONTRAST:  56m OMNIPAQUE IOHEXOL 350 MG/ML SOLN COMPARISON:  None. FINDINGS: Cardiovascular: There is mild to moderate severity calcification of the aortic arch. The subsegmental pulmonary arteries are limited in evaluation secondary to areas of overlying artifact. No evidence of pulmonary embolism. Normal heart size with moderate severity coronary artery calcification. No pericardial effusion. Mediastinum/Nodes: Mild pretracheal, AP window and bilateral hilar lymphadenopathy is seen. Thyroid gland, trachea, and esophagus demonstrate no significant findings. Lungs/Pleura: Moderate to marked severity diffusely thickened interstitium is noted bilaterally. Mild to moderate severity areas of atelectasis are seen along the periphery of both lungs. There is no evidence of a pleural effusion or pneumothorax. Upper Abdomen: No acute abnormality. Musculoskeletal: Degenerative changes are seen throughout the thoracic spine. Review of the MIP images confirms the above findings. IMPRESSION: 1. Diffuse, chronic appearing interstitial lung disease with associated areas of atelectasis. A superimposed component of pulmonary edema cannot be excluded. 2. No evidence of pulmonary embolism. 3. Moderate severity coronary artery disease. 4. Aortic atherosclerosis. Aortic Atherosclerosis (ICD10-I70.0). Electronically Signed   By: TVirgina NorfolkM.D.   On: 06/15/2021 17:16   DG Chest Port 1 View  Result Date: 06/15/2021 CLINICAL DATA:  Shortness of breath. EXAM: PORTABLE CHEST 1 VIEW COMPARISON:  05/25/2021 FINDINGS: Bilateral low lung volumes. Diffuse fine interstitial lung densities are again noted. Heart size is  grossly stable but accentuated by the low lung volumes. No large areas of consolidation. Negative for a pneumothorax. IMPRESSION: Low lung volumes with persistent diffuse parenchymal lung densities. Lung disease has minimally changed. Electronically Signed   By: AMarkus DaftM.D.   On: 06/15/2021 15:25   ECHOCARDIOGRAM COMPLETE  Result Date: 06/16/2021    ECHOCARDIOGRAM REPORT   Patient Name:   VTYMEL CONELYDate of Exam: 06/16/2021 Medical Rec #:  [id number]      Height:       70.0 in Accession #:    [id number]     Weight:       214.3 lb Date of Birth:  113-Feb-1957      BSA:          2.149 m Patient Age:    89years        BP:           157/89 mmHg Patient Gender: M               HR:           67 bpm. Exam Location:  Inpatient Procedure: 2D Echo, 3D Echo, Cardiac Doppler and Color Doppler Indications:    R06.02 SOB  History:        Patient has no prior history of Echocardiogram examinations.                 Signs/Symptoms:Dyspnea and Shortness of Breath; Risk                 Factors:Dyslipidemia. Myeloma.  Sonographer:    TRoseanna RainbowRDCS Referring Phys: [id number] Sonographer Comments: Suboptimal apical window. IMPRESSIONS  1. Left ventricular ejection fraction, by estimation, is 65 to 70%. The left ventricle has normal function. The left ventricle has no regional wall motion abnormalities. There is mild left ventricular hypertrophy. Left ventricular diastolic parameters are indeterminate. Elevated left atrial pressure.  2. Right ventricular systolic function is normal. The right ventricular size is normal. Tricuspid regurgitation signal is inadequate for assessing PA pressure.  3. The pericardial  effusion is circumferential.  4. The mitral valve is normal in structure. No evidence of mitral valve regurgitation. No evidence of mitral stenosis.  5. The aortic valve has an indeterminant number of cusps. Aortic valve regurgitation is not visualized. No aortic stenosis is present.  6. Aortic dilatation  noted. There is mild dilatation of the ascending aorta, measuring 38 mm. FINDINGS  Left Ventricle: Left ventricular ejection fraction, by estimation, is 65 to 70%. The left ventricle has normal function. The left ventricle has no regional wall motion abnormalities. The left ventricular internal cavity size was normal in size. There is  mild left ventricular hypertrophy. Left ventricular diastolic parameters are indeterminate. Elevated left atrial pressure. Right Ventricle: The right ventricular size is normal. No increase in right ventricular wall thickness. Right ventricular systolic function is normal. Tricuspid regurgitation signal is inadequate for assessing PA pressure. Left Atrium: Left atrial size was normal in size. Right Atrium: Right atrial size was normal in size. Pericardium: Trivial pericardial effusion is present. The pericardial effusion is circumferential. Mitral Valve: The mitral valve is normal in structure. There is mild thickening of the mitral valve leaflet(s). There is mild calcification of the mitral valve leaflet(s). Mild mitral annular calcification. No evidence of mitral valve regurgitation. No evidence of mitral valve stenosis. Tricuspid Valve: The tricuspid valve is normal in structure. Tricuspid valve regurgitation is trivial. No evidence of tricuspid stenosis. Aortic Valve: The aortic valve has an indeterminant number of cusps. Aortic valve regurgitation is not visualized. No aortic stenosis is present. Aortic valve mean gradient measures 6.3 mmHg. Aortic valve peak gradient measures 11.5 mmHg. Aortic valve area, by VTI measures 2.02 cm. Pulmonic Valve: The pulmonic valve was not well visualized. Pulmonic valve regurgitation is not visualized. No evidence of pulmonic stenosis. Aorta: The aortic root is normal in size and structure and aortic dilatation noted. There is mild dilatation of the ascending aorta, measuring 38 mm. Venous: The inferior vena cava was not well visualized.  IAS/Shunts: The interatrial septum was not well visualized.  LEFT VENTRICLE PLAX 2D LVIDd:         4.90 cm      Diastology LVIDs:         2.70 cm      LV e' medial:    6.20 cm/s LV PW:         1.20 cm      LV E/e' medial:  15.3 LV IVS:        1.10 cm      LV e' lateral:   6.53 cm/s LVOT diam:     1.90 cm      LV E/e' lateral: 14.5 LV SV:         81 LV SV Index:   38 LVOT Area:     2.84 cm  LV Volumes (MOD) LV vol d, MOD A2C: 123.0 ml LV vol d, MOD A4C: 85.4 ml LV vol s, MOD A2C: 30.0 ml LV vol s, MOD A4C: 21.9 ml LV SV MOD A2C:     93.0 ml LV SV MOD A4C:     85.4 ml LV SV MOD BP:      76.9 ml RIGHT VENTRICLE             IVC RV S prime:     15.00 cm/s  IVC diam: 1.50 cm TAPSE (M-mode): 2.1 cm LEFT ATRIUM             Index       RIGHT ATRIUM  Index LA diam:        4.60 cm 2.14 cm/m  RA Area:     14.30 cm LA Vol (A2C):   63.6 ml 29.59 ml/m RA Volume:   33.80 ml  15.73 ml/m LA Vol (A4C):   25.6 ml 11.91 ml/m LA Biplane Vol: 41.6 ml 19.35 ml/m  AORTIC VALVE AV Area (Vmax):    2.26 cm AV Area (Vmean):   2.06 cm AV Area (VTI):     2.02 cm AV Vmax:           169.40 cm/s AV Vmean:          119.341 cm/s AV VTI:            0.403 m AV Peak Grad:      11.5 mmHg AV Mean Grad:      6.3 mmHg LVOT Vmax:         135.00 cm/s LVOT Vmean:        86.900 cm/s LVOT VTI:          0.287 m LVOT/AV VTI ratio: 0.71  AORTA Ao Root diam: 3.00 cm Ao Asc diam:  3.80 cm MITRAL VALVE MV Area (PHT): 3.08 cm    SHUNTS MV Decel Time: 246 msec    Systemic VTI:  0.29 m MV E velocity: 94.70 cm/s  Systemic Diam: 1.90 cm MV A velocity: 80.10 cm/s MV E/A ratio:  1.18 Carlyle Dolly MD Electronically signed by Carlyle Dolly MD Signature Date/Time: 06/16/2021/10:32:36 AM    Final       Scheduled Meds:  aspirin EC  81 mg Oral Daily   enoxaparin (LOVENOX) injection  40 mg Subcutaneous Q24H   ezetimibe  10 mg Oral QHS   fluticasone furoate-vilanterol  1 puff Inhalation Daily   levothyroxine  150 mcg Oral Q48H   [START ON 06/17/2021]  levothyroxine  175 mcg Oral Q48H   montelukast  10 mg Oral QHS   pantoprazole  40 mg Oral Daily   simvastatin  40 mg Oral QHS   sodium chloride flush  3 mL Intravenous Q12H   Continuous Infusions:  sodium chloride 10 mL/hr at 06/15/21 1539   sodium chloride       LOS: 1 day      Time spent: 40 minutes   Dessa Phi, DO [facility] 06/16/2021, 12:16 PM   Available via Epic secure chat 7am-7pm After these hours, please refer to coverage provider listed on [email or web address]

## 2021-06-17 NOTE — Progress Notes (Signed)
PROGRESS NOTE    [name]  WFU:[id number] DOB: [birth year] DOA: 06/15/2021 PCP: Antony Contras, MD     Brief Narrative:  Samuel [name] is a 65 y.o. male with medical history significant for HLD, hypothyroidism, multiple myeloma followed by Oncology, mild intermittent asthma who presents for evaluation of SOB that is worsened with exertion.  He reports he has always had some shortness of breath when he exerts himself that generally in improves as he continues activities or exercise.  He states in the last few weeks has become more pronounced and has had more shortness of breath at rest.  When he arrived to the emergency room he had oxygen saturations in the mid 80s on room air with walking.  He has not had any fever or chills.  States he was told that asthma was a child and used an inhaler briefly when a child but had no treatment in his early adulthood until now.  He is reports a few weeks ago he was placed on albuterol and Breo due to his shortness of breath. He reports he does have a history of edema and takes Lasix intermittently but states he has never been told he had cardiac disease.  He states his father died of coronary artery disease.  He reports he has always had shortness of breath and feels like he has to try to take a big deep breath but feels constricting his chest when he first tries exercising but improves as he continues to do his activity.    Patient states that he has been short of breath all of his life, even as a child.  He had asthma as a child, had a couple of years where he had asthma attacks in his 43s.  He smoked tobacco briefly but has quit for decades now.  Denies any other drug use, vaping, e-cigarettes.  He describes dyspnea with exertion that has always been present, but would improve with physical activity.  He has been very physically active in his adult life, working with physical trainers, golfing.  However in the last couple of weeks, his shortness of breath has  progressed.  Dr. [name] with oncology who manages his multiple myeloma has initially tried Lasix 20 mg daily.  Despite urine output, this has not helped with his shortness of breath.  He denies any orthopnea or significant snoring.  He presented to Dr. Libby Maw office on 8/26 and was found to be satting in the 80s on room air and was sent to the emergency room for further evaluation.  He denies any fevers, chills, significant cough, nausea, vomiting, diarrhea or abdominal pain.  He feels well at rest, but states that he is not able to take more than a few steps before feeling short of breath.  Denies any wheezing.  He lives at home with wife, denies any recent international travel (went to the mountains in [place] recently).  He used to work as a Music therapist in an office setting, denies any significant work in [employer] or farm.  They own a small hypoallergenic dog, no other pets.  He has never experienced homelessness, never been imprisoned.  He has no cardiac disease that he is aware of other than hyperlipidemia.  He denies any significant chest pain.  He does not use oxygen at baseline.  He does admit to seasonal allergies, sees an allergist.  New events last 24 hours / Subjective: States that he is feeling better, has been weaned off of oxygen.  Hoping  to go home soon as possible, He is very anxious which has led to increase in his blood pressure readings  Assessment & Plan:   Principal Problem:   Hypoxia Active Problems:   Mild intermittent asthma   High cholesterol   Multiple myeloma (HCC)   Dyspnea  Acute hypoxemic respiratory failure -Found to be satting in the mid 80s on room air -Now weaned and remained stable on room air -Echocardiogram without significant evidence of heart failure.  EF is 60%.  BNP 32. -Unclear etiology, CTA chest did reveal underlying interstitial lung disease. Possibly related to chemotherapy.  He is on bortezomib and [name] which was started in May  2022.  However, his shortness of breath and dyspnea on exertion preceded his treatment -Appreciate pulmonology, plan for bronchoscopy tomorrow, started on IV Solu-Medrol  Multiple myeloma -Followed by Dr. [name] as an outpatient, currently undergoing chemotherapy -Continue acyclovir  Hyperlipidemia -Continue Vytorin  Anxiety -Continue Xanax as needed  BPH -Continue Flomax  Hypothyroidism -Continue Synthroid    DVT prophylaxis:  enoxaparin (LOVENOX) injection 40 mg Start: 06/15/21 2300  Code Status:     Code Status Orders  (From admission, onward)           Start     Ordered   06/15/21 2213  Full code  Continuous        06/15/21 2212           Code Status History     This patient has a current code status but no historical code status.      Advance Directive Documentation    Flowsheet Row Most Recent Value  Type of Advance Directive Healthcare Power of Attorney  Pre-existing out of facility DNR order (yellow form or pink MOST form) --  "MOST" Form in Place? --      Family Communication: No family at bedside Disposition Plan:  Status is: Inpatient  Remains inpatient appropriate because:Ongoing diagnostic testing needed not appropriate for outpatient work up and Inpatient level of care appropriate due to severity of illness  Dispo: The patient is from: Home              Anticipated d/c is to: Home              Patient currently is not medically stable to d/c.   Difficult to place patient No      Consultants:  Pulmonology   Antimicrobials:  Anti-infectives (From admission, onward)    Start     Dose/Rate Route Frequency Ordered Stop   06/16/21 1315  acyclovir (ZOVIRAX) tablet 400 mg        400 mg Oral 2 times daily 06/16/21 1228          Objective: Vitals:   06/16/21 2023 06/17/21 0451 06/17/21 0844 06/17/21 0849  BP: (!) 163/87 (!) 154/88 (!) 172/95   Pulse: 68 61    Resp: '16 18 16   ' Temp: 97.8 F (36.6 C) (!) 97.5 F (36.4  C) 97.6 F (36.4 C)   TempSrc: Oral Oral Oral   SpO2: 95% 97% 99% 98%  Weight:      Height:       No intake or output data in the 24 hours ending 06/17/21 1300  Filed Weights   06/15/21 1408 06/15/21 2206  Weight: 96.1 kg 97.2 kg   Examination: General exam: Appears calm and comfortable  Respiratory system: Crackles bilaterally, respiratory effort is normal on room air without conversational dyspnea Cardiovascular system: S1 & S2 heard, RRR.  No pedal edema. Gastrointestinal system: Abdomen is nondistended, soft and nontender. Normal bowel sounds heard. Central nervous system: Alert and oriented. Non focal exam. Speech clear  Extremities: Symmetric in appearance bilaterally  Skin: No rashes, lesions or ulcers on exposed skin  Psychiatry: Judgement and insight appear stable. Mood & affect appropriate.    Data Reviewed: I have personally reviewed following labs and imaging studies  CBC: Recent Labs  Lab 06/15/21 1124 06/15/21 1416 06/16/21 0727  WBC 10.1 8.4 10.9*  NEUTROABS 9.0* 7.7  --   HGB 12.6* 13.1 12.3*  HCT 36.3* 38.2* 35.9*  MCV 90.8 92.5 91.8  PLT 203 216 643    Basic Metabolic Panel: Recent Labs  Lab 06/15/21 1124 06/15/21 1416 06/16/21 0727  NA 127* 132* 135  K 4.4 4.4 4.1  CL 96* 98 101  CO2 '25 25 25  ' GLUCOSE 106* 147* 128*  BUN '16 17 15  ' CREATININE 1.01 0.87 0.66  CALCIUM 8.8* 8.8* 8.7*    GFR: Estimated Creatinine Clearance: 109.1 mL/min (by C-G formula based on SCr of 0.66 mg/dL). Liver Function Tests: Recent Labs  Lab 06/15/21 1124  AST 20  ALT 18  ALKPHOS 64  BILITOT 0.5  PROT 6.9  ALBUMIN 3.3*    No results for input(s): LIPASE, AMYLASE in the last 168 hours. No results for input(s): AMMONIA in the last 168 hours. Coagulation Profile: No results for input(s): INR, PROTIME in the last 168 hours. Cardiac Enzymes: No results for input(s): CKTOTAL, CKMB, CKMBINDEX, TROPONINI in the last 168 hours. BNP (last 3 results) No  results for input(s): PROBNP in the last 8760 hours. HbA1C: No results for input(s): HGBA1C in the last 72 hours. CBG: No results for input(s): GLUCAP in the last 168 hours. Lipid Profile: No results for input(s): CHOL, HDL, LDLCALC, TRIG, CHOLHDL, LDLDIRECT in the last 72 hours. Thyroid Function Tests: No results for input(s): TSH, T4TOTAL, FREET4, T3FREE, THYROIDAB in the last 72 hours. Anemia Panel: No results for input(s): VITAMINB12, FOLATE, FERRITIN, TIBC, IRON, RETICCTPCT in the last 72 hours. Sepsis Labs: No results for input(s): PROCALCITON, LATICACIDVEN in the last 168 hours.  Recent Results (from the past 240 hour(s))  SARS CORONAVIRUS 2 (TAT 6-24 HRS) Nasopharyngeal Nasopharyngeal Swab     Status: None   Collection Time: 06/15/21  8:08 PM   Specimen: Nasopharyngeal Swab  Result Value Ref Range Status   SARS Coronavirus 2 NEGATIVE NEGATIVE Final    Comment: (NOTE) SARS-CoV-2 target nucleic acids are NOT DETECTED.  The SARS-CoV-2 RNA is generally detectable in upper and lower respiratory specimens during the acute phase of infection. Negative results do not preclude SARS-CoV-2 infection, do not rule out co-infections with other pathogens, and should not be used as the sole basis for treatment or other patient management decisions. Negative results must be combined with clinical observations, patient history, and epidemiological information. The expected result is Negative.  Fact Sheet for Patients: SugarRoll.be  Fact Sheet for Healthcare Providers: https://www.woods-mathews.com/  This test is not yet approved or cleared by the [place] FDA and  has been authorized for detection and/or diagnosis of SARS-CoV-2 by FDA under an Emergency Use Authorization (EUA). This EUA will remain  in effect (meaning this test can be used) for the duration of the COVID-19 declaration under Se ction 564(b)(1) of the Act, 21 U.S.C. section  360bbb-3(b)(1), unless the authorization is terminated or revoked sooner.  Performed at [facility], [place] [address] [place],  [address]  Radiology Studies: CT Angio Chest PE W/Cm &/Or Wo Cm  Result Date: 06/15/2021 CLINICAL DATA:  History of multiple myeloma with several days of increasing dyspnea upon exertion. EXAM: CT ANGIOGRAPHY CHEST WITH CONTRAST TECHNIQUE: Multidetector CT imaging of the chest was performed using the standard protocol during bolus administration of intravenous contrast. Multiplanar CT image reconstructions and MIPs were obtained to evaluate the vascular anatomy. CONTRAST:  6m OMNIPAQUE IOHEXOL 350 MG/ML SOLN COMPARISON:  None. FINDINGS: Cardiovascular: There is mild to moderate severity calcification of the aortic arch. The subsegmental pulmonary arteries are limited in evaluation secondary to areas of overlying artifact. No evidence of pulmonary embolism. Normal heart size with moderate severity coronary artery calcification. No pericardial effusion. Mediastinum/Nodes: Mild pretracheal, AP window and bilateral hilar lymphadenopathy is seen. Thyroid gland, trachea, and esophagus demonstrate no significant findings. Lungs/Pleura: Moderate to marked severity diffusely thickened interstitium is noted bilaterally. Mild to moderate severity areas of atelectasis are seen along the periphery of both lungs. There is no evidence of a pleural effusion or pneumothorax. Upper Abdomen: No acute abnormality. Musculoskeletal: Degenerative changes are seen throughout the thoracic spine. Review of the MIP images confirms the above findings. IMPRESSION: 1. Diffuse, chronic appearing interstitial lung disease with associated areas of atelectasis. A superimposed component of pulmonary edema cannot be excluded. 2. No evidence of pulmonary embolism. 3. Moderate severity coronary artery disease. 4. Aortic atherosclerosis. Aortic Atherosclerosis (ICD10-I70.0). Electronically  Signed   By: TVirgina NorfolkM.D.   On: 06/15/2021 17:16   DG Chest Port 1 View  Result Date: 06/15/2021 CLINICAL DATA:  Shortness of breath. EXAM: PORTABLE CHEST 1 VIEW COMPARISON:  05/25/2021 FINDINGS: Bilateral low lung volumes. Diffuse fine interstitial lung densities are again noted. Heart size is grossly stable but accentuated by the low lung volumes. No large areas of consolidation. Negative for a pneumothorax. IMPRESSION: Low lung volumes with persistent diffuse parenchymal lung densities. Lung disease has minimally changed. Electronically Signed   By: AMarkus DaftM.D.   On: 06/15/2021 15:25   ECHOCARDIOGRAM COMPLETE  Result Date: 06/16/2021    ECHOCARDIOGRAM REPORT   Patient Name:   Samuel POPELKADate of Exam: 06/16/2021 Medical Rec #:  [id number]      Height:       70.0 in Accession #:    [id number]     Weight:       214.3 lb Date of Birth:  111/26/1957      BSA:          2.149 m Patient Age:    89years        BP:           157/89 mmHg Patient Gender: M               HR:           67 bpm. Exam Location:  Inpatient Procedure: 2D Echo, 3D Echo, Cardiac Doppler and Color Doppler Indications:    R06.02 SOB  History:        Patient has no prior history of Echocardiogram examinations.                 Signs/Symptoms:Dyspnea and Shortness of Breath; Risk                 Factors:Dyslipidemia. Myeloma.  Sonographer:    TRoseanna RainbowRDCS Referring Phys: [id number] Sonographer Comments: Suboptimal apical window. IMPRESSIONS  1. Left ventricular ejection fraction, by estimation, is 65 to 70%. The  left ventricle has normal function. The left ventricle has no regional wall motion abnormalities. There is mild left ventricular hypertrophy. Left ventricular diastolic parameters are indeterminate. Elevated left atrial pressure.  2. Right ventricular systolic function is normal. The right ventricular size is normal. Tricuspid regurgitation signal is inadequate for assessing PA pressure.  3. The  pericardial effusion is circumferential.  4. The mitral valve is normal in structure. No evidence of mitral valve regurgitation. No evidence of mitral stenosis.  5. The aortic valve has an indeterminant number of cusps. Aortic valve regurgitation is not visualized. No aortic stenosis is present.  6. Aortic dilatation noted. There is mild dilatation of the ascending aorta, measuring 38 mm. FINDINGS  Left Ventricle: Left ventricular ejection fraction, by estimation, is 65 to 70%. The left ventricle has normal function. The left ventricle has no regional wall motion abnormalities. The left ventricular internal cavity size was normal in size. There is  mild left ventricular hypertrophy. Left ventricular diastolic parameters are indeterminate. Elevated left atrial pressure. Right Ventricle: The right ventricular size is normal. No increase in right ventricular wall thickness. Right ventricular systolic function is normal. Tricuspid regurgitation signal is inadequate for assessing PA pressure. Left Atrium: Left atrial size was normal in size. Right Atrium: Right atrial size was normal in size. Pericardium: Trivial pericardial effusion is present. The pericardial effusion is circumferential. Mitral Valve: The mitral valve is normal in structure. There is mild thickening of the mitral valve leaflet(s). There is mild calcification of the mitral valve leaflet(s). Mild mitral annular calcification. No evidence of mitral valve regurgitation. No evidence of mitral valve stenosis. Tricuspid Valve: The tricuspid valve is normal in structure. Tricuspid valve regurgitation is trivial. No evidence of tricuspid stenosis. Aortic Valve: The aortic valve has an indeterminant number of cusps. Aortic valve regurgitation is not visualized. No aortic stenosis is present. Aortic valve mean gradient measures 6.3 mmHg. Aortic valve peak gradient measures 11.5 mmHg. Aortic valve area, by VTI measures 2.02 cm. Pulmonic Valve: The pulmonic valve  was not well visualized. Pulmonic valve regurgitation is not visualized. No evidence of pulmonic stenosis. Aorta: The aortic root is normal in size and structure and aortic dilatation noted. There is mild dilatation of the ascending aorta, measuring 38 mm. Venous: The inferior vena cava was not well visualized. IAS/Shunts: The interatrial septum was not well visualized.  LEFT VENTRICLE PLAX 2D LVIDd:         4.90 cm      Diastology LVIDs:         2.70 cm      LV e' medial:    6.20 cm/s LV PW:         1.20 cm      LV E/e' medial:  15.3 LV IVS:        1.10 cm      LV e' lateral:   6.53 cm/s LVOT diam:     1.90 cm      LV E/e' lateral: 14.5 LV SV:         81 LV SV Index:   38 LVOT Area:     2.84 cm  LV Volumes (MOD) LV vol d, MOD A2C: 123.0 ml LV vol d, MOD A4C: 85.4 ml LV vol s, MOD A2C: 30.0 ml LV vol s, MOD A4C: 21.9 ml LV SV MOD A2C:     93.0 ml LV SV MOD A4C:     85.4 ml LV SV MOD BP:      76.9 ml RIGHT  VENTRICLE             IVC RV S prime:     15.00 cm/s  IVC diam: 1.50 cm TAPSE (M-mode): 2.1 cm LEFT ATRIUM             Index       RIGHT ATRIUM           Index LA diam:        4.60 cm 2.14 cm/m  RA Area:     14.30 cm LA Vol (A2C):   63.6 ml 29.59 ml/m RA Volume:   33.80 ml  15.73 ml/m LA Vol (A4C):   25.6 ml 11.91 ml/m LA Biplane Vol: 41.6 ml 19.35 ml/m  AORTIC VALVE AV Area (Vmax):    2.26 cm AV Area (Vmean):   2.06 cm AV Area (VTI):     2.02 cm AV Vmax:           169.40 cm/s AV Vmean:          119.341 cm/s AV VTI:            0.403 m AV Peak Grad:      11.5 mmHg AV Mean Grad:      6.3 mmHg LVOT Vmax:         135.00 cm/s LVOT Vmean:        86.900 cm/s LVOT VTI:          0.287 m LVOT/AV VTI ratio: 0.71  AORTA Ao Root diam: 3.00 cm Ao Asc diam:  3.80 cm MITRAL VALVE MV Area (PHT): 3.08 cm    SHUNTS MV Decel Time: 246 msec    Systemic VTI:  0.29 m MV E velocity: 94.70 cm/s  Systemic Diam: 1.90 cm MV A velocity: 80.10 cm/s MV E/A ratio:  1.18 Carlyle Dolly MD Electronically signed by Carlyle Dolly MD  Signature Date/Time: 06/16/2021/10:32:36 AM    Final       Scheduled Meds:  acyclovir  400 mg Oral BID   allopurinol  300 mg Oral Daily   aspirin EC  81 mg Oral Daily   enoxaparin (LOVENOX) injection  40 mg Subcutaneous Q24H   ezetimibe  10 mg Oral QHS   fluticasone furoate-vilanterol  1 puff Inhalation Daily   levothyroxine  150 mcg Oral Q48H   levothyroxine  175 mcg Oral Q48H   methylPREDNISolone (SOLU-MEDROL) injection  100 mg Intravenous Q24H   montelukast  10 mg Oral QHS   pantoprazole  40 mg Oral Daily   simvastatin  40 mg Oral QHS   sodium chloride flush  3 mL Intravenous Q12H   tamsulosin  0.4 mg Oral Daily   Continuous Infusions:  sodium chloride 10 mL/hr at 06/15/21 1539   sodium chloride       LOS: 2 days      Time spent: 25 minutes   Dessa Phi, DO [facility] 06/17/2021, 1:00 PM   Available via Epic secure chat 7am-7pm After these hours, please refer to coverage provider listed on [email or web address]

## 2021-06-18 ENCOUNTER — Encounter (HOSPITAL_COMMUNITY): Payer: Self-pay | Admitting: Family Medicine

## 2021-06-18 ENCOUNTER — Inpatient Hospital Stay (HOSPITAL_COMMUNITY): Payer: Managed Care, Other (non HMO) | Admitting: Certified Registered"

## 2021-06-18 ENCOUNTER — Inpatient Hospital Stay (HOSPITAL_COMMUNITY): Payer: Managed Care, Other (non HMO)

## 2021-06-18 ENCOUNTER — Encounter (HOSPITAL_COMMUNITY)
Admission: EM | Disposition: A | Discharge status: Home / Self Care | Payer: Self-pay | Source: Home / Self Care | Attending: Internal Medicine

## 2021-06-18 ENCOUNTER — Other Ambulatory Visit: Payer: Self-pay | Admitting: *Deleted

## 2021-06-18 DIAGNOSIS — R9389 Abnormal findings on diagnostic imaging of other specified body structures: Secondary | ICD-10-CM | POA: Diagnosis not present

## 2021-06-18 HISTORY — PX: BRONCHIAL WASHINGS: SHX5105

## 2021-06-18 HISTORY — PX: VIDEO BRONCHOSCOPY: SHX5072

## 2021-06-18 HISTORY — PX: BRONCHIAL BIOPSY: SHX5109

## 2021-06-18 LAB — BODY FLUID CELL COUNT WITH DIFFERENTIAL
Eos, Fluid: 51 %
Lymphs, Fluid: 18 %
Monocyte-Macrophage-Serous Fluid: 5 % — ABNORMAL LOW (ref 50–90)
Neutrophil Count, Fluid: 26 % — ABNORMAL HIGH (ref 0–25)
Total Nucleated Cell Count, Fluid: 103 cu mm (ref 0–1000)

## 2021-06-18 SURGERY — BRONCHOSCOPY, WITH FLUOROSCOPY
Anesthesia: Monitor Anesthesia Care

## 2021-06-18 MED ORDER — LACTATED RINGERS IV SOLN: INTRAVENOUS | Status: DC

## 2021-06-18 MED ORDER — FUROSEMIDE 20 MG PO TABS: 20.0000 mg | ORAL_TABLET | ORAL | 1 refills | Status: DC | PRN

## 2021-06-18 MED ORDER — LIDOCAINE HCL URETHRAL/MUCOSAL 2 % EX GEL
CUTANEOUS | Status: AC
  Filled 2021-06-18: qty 30

## 2021-06-18 MED ORDER — LIDOCAINE HCL 1 % IJ SOLN
INTRAMUSCULAR | Status: AC
  Filled 2021-06-18: qty 20

## 2021-06-18 MED ORDER — PREDNISONE 10 MG PO TABS: ORAL_TABLET | ORAL | 0 refills | Status: DC

## 2021-06-18 MED ORDER — LIDOCAINE HCL (PF) 4 % IJ SOLN
5.0000 mL | Freq: Once | INTRAMUSCULAR | Status: AC
  Administered 2021-06-18: 5 mL

## 2021-06-18 MED ORDER — LIDOCAINE HCL (PF) 4 % IJ SOLN
INTRAMUSCULAR | Status: AC
  Filled 2021-06-18: qty 5

## 2021-06-18 MED ORDER — PHENYLEPHRINE HCL 0.25 % NA SOLN
NASAL | Status: DC | PRN
  Administered 2021-06-18: 2 via NASAL

## 2021-06-18 MED ORDER — PREDNISONE 50 MG PO TABS
50.0000 mg | ORAL_TABLET | Freq: Every day | ORAL | Status: DC
  Filled 2021-06-18: qty 1

## 2021-06-18 MED ORDER — PHENYLEPHRINE HCL 0.25 % NA SOLN
NASAL | Status: AC
  Filled 2021-06-18: qty 15

## 2021-06-18 MED ORDER — LIDOCAINE 2% (20 MG/ML) 5 ML SYRINGE
INTRAMUSCULAR | Status: DC | PRN
  Administered 2021-06-18: 100 mg via INTRAVENOUS

## 2021-06-18 MED ORDER — FENTANYL CITRATE (PF) 100 MCG/2ML IJ SOLN
INTRAMUSCULAR | Status: AC
  Filled 2021-06-18: qty 2

## 2021-06-18 MED ORDER — PROPOFOL 1000 MG/100ML IV EMUL
INTRAVENOUS | Status: AC
  Filled 2021-06-18: qty 100

## 2021-06-18 MED ORDER — ONDANSETRON HCL 4 MG/2ML IJ SOLN
INTRAMUSCULAR | Status: DC | PRN
  Administered 2021-06-18: 4 mg via INTRAVENOUS

## 2021-06-18 MED ORDER — PHENYLEPHRINE HCL 1 % NA SOLN
NASAL | Status: AC
  Filled 2021-06-18: qty 15

## 2021-06-18 MED ORDER — PROPOFOL 500 MG/50ML IV EMUL
INTRAVENOUS | Status: DC | PRN
  Administered 2021-06-18: 150 ug/kg/min via INTRAVENOUS

## 2021-06-18 MED ORDER — PROPOFOL 10 MG/ML IV BOLUS
INTRAVENOUS | Status: DC | PRN
  Administered 2021-06-18: 10 mg via INTRAVENOUS
  Administered 2021-06-18: 20 mg via INTRAVENOUS

## 2021-06-18 MED ORDER — FENTANYL CITRATE (PF) 100 MCG/2ML IJ SOLN
INTRAMUSCULAR | Status: DC | PRN
  Administered 2021-06-18 (×2): 25 ug via INTRAVENOUS

## 2021-06-18 MED ORDER — LIDOCAINE HCL (PF) 1 % IJ SOLN
INTRAMUSCULAR | Status: DC | PRN
  Administered 2021-06-18: 4 mL

## 2021-06-18 MED ORDER — LIDOCAINE HCL URETHRAL/MUCOSAL 2 % EX GEL
CUTANEOUS | Status: DC | PRN
  Administered 2021-06-18: 1 via TOPICAL

## 2021-06-18 NOTE — Anesthesia Postprocedure Evaluation (Signed)
Anesthesia Post Note  Patient: Samuel [name]  Procedure(s) Performed: VIDEO BRONCHOSCOPY WITH FLUORO BRONCHIAL WASHINGS BRONCHIAL BIOPSIES     Patient location during evaluation: PACU Anesthesia Type: MAC Level of consciousness: awake and alert Pain management: pain level controlled Vital Signs Assessment: post-procedure vital signs reviewed and stable Respiratory status: spontaneous breathing, nonlabored ventilation, respiratory function stable and patient connected to nasal cannula oxygen Cardiovascular status: stable and blood pressure returned to baseline Postop Assessment: no apparent nausea or vomiting Anesthetic complications: no   No notable events documented.  Last Vitals:  Vitals:   06/18/21 1340 06/18/21 1415  BP:  (!) 156/96  Pulse: 67 (!) 58  Resp: 19 18  Temp:  36.7 C  SpO2: 94% 96%    Last Pain:  Vitals:   06/18/21 1415  TempSrc: Oral  PainSc:                  Mellissa Conley S

## 2021-06-18 NOTE — Interval H&P Note (Signed)
History and Physical Interval Note:  06/18/2021 12:46 PM  Samuel [name]  has presented today for surgery, with the diagnosis of ild.  The various methods of treatment have been discussed with the patient and family. After consideration of risks, benefits and other options for treatment, the patient has consented to  Procedure(s): VIDEO BRONCHOSCOPY WITH FLUORO (N/A) as a surgical intervention.  The patient's history has been reviewed, patient examined, no change in status, stable for surgery.  I have reviewed the patient's chart and labs.  Questions were answered to the patient's satisfaction.     Collene Gobble

## 2021-06-18 NOTE — Anesthesia Preprocedure Evaluation (Signed)
Anesthesia Evaluation  Patient identified by MRN, date of birth, ID band Patient awake    Reviewed: Allergy & Precautions, NPO status , Patient's Chart, lab work & pertinent test results  Airway Mallampati: III  TM Distance: <3 FB Neck ROM: Full    Dental no notable dental hx.    Pulmonary shortness of breath, with exertion and at rest, asthma , former smoker,  ILD  covid 4/22   breath sounds clear to auscultation + decreased breath sounds      Cardiovascular negative cardio ROS Normal cardiovascular exam Rhythm:Regular Rate:Normal     Neuro/Psych negative neurological ROS  negative psych ROS   GI/Hepatic negative GI ROS, Neg liver ROS,   Endo/Other  negative endocrine ROS  Renal/GU negative Renal ROS  negative genitourinary   Musculoskeletal negative musculoskeletal ROS (+)   Abdominal   Peds negative pediatric ROS (+)  Hematology MM   Anesthesia Other Findings   Reproductive/Obstetrics negative OB ROS                             Anesthesia Physical Anesthesia Plan  ASA: 3  Anesthesia Plan: MAC   Post-op Pain Management:    Induction: Intravenous  PONV Risk Score and Plan: 1 and Propofol infusion  Airway Management Planned: Simple Face Mask  Additional Equipment:   Intra-op Plan:   Post-operative Plan:   Informed Consent: I have reviewed the patients History and Physical, chart, labs and discussed the procedure including the risks, benefits and alternatives for the proposed anesthesia with the patient or authorized representative who has indicated his/her understanding and acceptance.     Dental advisory given  Plan Discussed with: CRNA and Surgeon  Anesthesia Plan Comments:         Anesthesia Quick Evaluation

## 2021-06-18 NOTE — Progress Notes (Signed)
Pulmonary Progress Note  Rounded on patient post-bronchoscopy. Appears comfortable on room air. Reports sore throat. Denies hemoptysis, cough or chest pain. Discussed monitoring for respiratory symptoms post-procedure including worsening shortness of breath, chest pain or coughing blood. If these symptoms develop, advised to present to ED for evaluation.  Acute hypoxemic respiratory failure secondary diffuse parenchymal lung disease, unknown etiology - improving on steroids. Tolerated bronchoscopy. No indication for home O2 based on ambulatory O2 --Prednisone taper discussed with primary team --Change lasix to PRN --Will arrange for pulmonary follow-up in 2-3 weeks --F/u BAL and biopsy  Pulmonary will sign off  Rodman Pickle, M.D. [facility] 06/18/2021 5:01 PM

## 2021-06-18 NOTE — Consult Note (Signed)
NAME:  Samuel [name], MRN:  [id number], DOB:  1955/11/28, LOS: 3 ADMISSION DATE:  06/15/2021, CONSULTATION DATE:  8/27 REFERRING MD:  Evette Doffing, CHIEF COMPLAINT:  dyspnea   History of Present Illness:  65 y/o male with a history of multiple myeloma diagnosed in 2022, currently undergoing treatment presented to the [facility] emergency department from his hematology oncology appointment with a chief complaint of dyspnea.  The patient smoked cigarettes briefly as a young adult but since then has lived a relatively healthy lifestyle staying active with exercise and playing golf.  He was diagnosed with multiple myeloma in the spring 2022 when a bruise was discovered while he was receiving a sports massage.  He went for further work-up and was found to have abnormal lab work, this led to a bone marrow biopsy confirming diagnosis of multiple myeloma.  He has been followed by Dr. [name] who has been treating him with VRd chemotherapy since Mar 16, 2021.  He is currently in the middle of his fifth cycle.    Over the last several weeks he has been developing progressive shortness of breath with exertion.  He says this has been going on for about 3 to 4 weeks and has significantly worsened in the last week.  This is associated with a dry, rare cough.  He denies fevers, chills, leg swelling, weight gain, or mucus production.  In the last several days (just prior to admission) [place] felt significant shortness of breath so he presented to Dr. Libby Maw office for further evaluation.  He was sent to the emergency room for evaluation further because of the severity of his shortness of breath.  In the emergency department he was noted to have hypoxemia to an O2 saturation of 87% and an abnormal chest exam.  Pulmonary and critical care medicine was consulted for further evaluation.  He says that throughout his adult life he has had what he calls "huffing" when he talks while exerting  himself or with exertion.  He says he has been followed by an allergist and has been treated for asthma at times over the years.  He does not regularly use an inhaler and he says this is not something that he routinely needs.  He was seen by my partner Dr. [name] at 1 point in 2013 during which time he had a normal chest x-ray and normal lung function testing.  He had COVID in April 2022.    Pertinent  Medical History  Asthma, allergic rhinitis Hypercholesterolemia History of "thyroid disease", not otherwise specified  Significant Hospital Events: Including procedures, antibiotic start and stop dates in addition to other pertinent events   June 15, 2021 admission August 26 CT chest images independently reviewed: No pulmonary embolism, diffuse interlobular septal thickening, pleural nodularity noted, areas of groundglass opacification with some patchy distribution particularly in the lung base, no significant pleural effusion, mild reactive appearing lymphadenopathy in the mediastinum  Interim History / Subjective:  Reports improved dyspnea after steroids initiated  Objective   Blood pressure (!) 179/56, pulse 64, temperature 98 F (36.7 C), temperature source Oral, resp. rate (!) 21, height _0  (1.778 m), weight 97.2 kg, SpO2 98 %.       No intake or output data in the 24 hours ending 06/18/21 1218  Filed Weights   06/15/21 1408 06/15/21 2206 06/18/21 1157  Weight: 96.1 kg 97.2 kg 97.2 kg   Physical Exam: General: Well-appearing, no acute distress HENT: [place], AT,  OP clear, MMM Eyes: EOMI, no scleral icterus Respiratory: Faint bibasilar crackles  No wheezing or rales Cardiovascular: RRR, -M/R/G, no JVD Extremities:-Edema,-tenderness Neuro: AAO x4, CNII-XII grossly intact Psych: Normal mood, normal affect   Resolved Hospital Problem list     Assessment & Plan:  Acute respiratory failure with hypoxemia in the setting of 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  Based on the time course of his illness and reports in the literature I am most concerned about [name] lung toxicity (see citation below), though it is also important to evaluate for another underlying and progressive primary pulmonary ILD or less likely an opportunistic infection (history doesn't seem to support this).  [name] can cause eosinophilic pneumonia which we can assess for with a bronchoscopy, but his imaging characteristics aren't typical for this.  It is possible that the non-specific interstitial changes seen on his lungs in April 2022 were related to his recent COVID infection.  I don't think his baseline asthma explains his symptoms, though he did have a high serum eosinophil count a few weeks prior to admission (unclear significance).   Discussed risks of bronchoscopy and potential clinical course of his symptoms if this is ILD vs drug-induced pneumonitis.  >Off oxgyen >Scheduled for bronchoscopy for BAL and transbronchial biopsy today >Titrate off solumedrol and start prednisone 50 mg daily. Decrease by 10 mg weekly > continue to wean off O2 for O2 saturation > 88% >Will arrange for follow-up in Pulmonary clinic in 3 weeks  Saglam B, Kalyon H, Ozbalak M, Ornek S, Keske S, Tabak L, Cakar N, Zeren H, Aytekin S, [place], [place] B. Bortezomib induced pulmonary toxicity: a case report and review of the literature. Am J Blood Res. 2020 Dec 15;10(6):407-415. PMID: 76546503; PMCID: [id number].  Best Practice (right click and "Reselect all SmartList Selections" daily)   Per [facility]  Labs   CBC: Recent Labs  Lab 06/15/21 1124 06/15/21 1416 06/16/21 0727  WBC 10.1 8.4 10.9*  NEUTROABS 9.0* 7.7  --   HGB 12.6* 13.1 12.3*   HCT 36.3* 38.2* 35.9*  MCV 90.8 92.5 91.8  PLT 203 216 170    Basic Metabolic Panel: Recent Labs  Lab 06/15/21 1124 06/15/21 1416 06/16/21 0727  NA 127* 132* 135  K 4.4 4.4 4.1  CL 96* 98 101  CO2 _0 GLUCOSE 106* 147* 128*  BUN _1 CREATININE 1.01 0.87 0.66  CALCIUM 8.8* 8.8* 8.7*   GFR: Estimated Creatinine Clearance: 109.1 mL/min (by C-G formula based on SCr of 0.66 mg/dL). Recent Labs  Lab 06/15/21 1124 06/15/21 1416 06/16/21 0727  WBC 10.1 8.4 10.9*    Liver Function Tests: Recent Labs  Lab 06/15/21 1124  AST 20  ALT 18  ALKPHOS 64  BILITOT 0.5  PROT 6.9  ALBUMIN 3.3*   No results for input(s): LIPASE, AMYLASE in the last 168 hours. No results for input(s): AMMONIA in the last 168 hours.  ABG    Component Value Date/Time   PHART 7.445 06/15/2021 2045   PCO2ART 34.0 06/15/2021 2045   PO2ART 96.6 06/15/2021 2045   HCO3 23.0 06/15/2021 2045   O2SAT 97.6 06/15/2021 2045     Coagulation Profile: No results for  input(s): INR, PROTIME in the last 168 hours.  Cardiac Enzymes: No results for input(s): CKTOTAL, CKMB, CKMBINDEX, TROPONINI in the last 168 hours.  HbA1C: No results found for: HGBA1C  CBG: No results for input(s): GLUCAP in the last 168 hours.  Review of Systems:   Gen: Denies fever, chills, weight change, fatigue, night sweats HEENT: Denies blurred vision, double vision, hearing loss, tinnitus, sinus congestion, rhinorrhea, sore throat, neck stiffness, dysphagia PULM: per HPI CV: Denies chest pain, edema, orthopnea, paroxysmal nocturnal dyspnea, palpitations GI: Denies abdominal pain, nausea, vomiting, diarrhea, hematochezia, melena, constipation, change in bowel habits GU: Denies dysuria, hematuria, polyuria, oliguria, urethral discharge Endocrine: Denies hot or cold intolerance, polyuria, polyphagia or appetite change Derm: Denies rash, dry skin, scaling or peeling skin change Heme: Denies easy bruising, bleeding,  bleeding gums Neuro: Denies headache, numbness, weakness, slurred speech, loss of memory or consciousness   Past Medical History:  He,  has a past medical history of Asthma, High cholesterol, Perennial allergic rhinitis, Sciatic pain, right, Seasonal allergic rhinitis, and Thyroid disease.   Surgical History:   Past Surgical History:  Procedure Laterality Date   NASAL SINUS SURGERY     NECK SURGERY  1996     Social History:   reports that he quit smoking about 38 years ago. His smoking use included cigarettes. He has a 0.50 pack-year smoking history. He has never used smokeless tobacco. He reports current alcohol use. He reports that he does not use drugs.   Family History:  His family history includes Allergies in his father and mother; Breast cancer in his paternal grandmother; Heart disease in an other family member; Hypertension in an other family member.   Allergies Allergies  Allergen Reactions   Clarithromycin    Penicillins      Home Medications  Prior to Admission medications   Medication Sig Start Date End Date Taking? Authorizing Provider  acyclovir (ZOVIRAX) 400 MG tablet Take 1 tablet (400 mg total) by mouth 2 (two) times daily. 03/05/21  Yes Orson Slick, MD  albuterol (VENTOLIN HFA) 108 (90 Base) MCG/ACT inhaler Inhale 2 puffs into the lungs every 6 (six) hours as needed for wheezing or shortness of breath. 05/28/21  Yes Orson Slick, MD  allopurinol (ZYLOPRIM) 300 MG tablet Take 300 mg by mouth daily. 03/05/21  Yes [provider]  ALPRAZolam Duanne Moron) 1 MG tablet Take 1 mg by mouth 2 (two) times daily as needed for anxiety.   Yes [provider]  aspirin EC 81 MG tablet Take 81 mg by mouth daily. Swallow whole.   Yes [provider]  [place] as directed. 07/15/11  Yes [provider]  BREO ELLIPTA 200-25 MCG/INH AEPB Inhale 1 puff into the lungs daily. 05/31/21  Yes [provider]   cholecalciferol (VITAMIN D) 1000 UNITS tablet Take 3,000 Units by mouth daily.   Yes [provider]  dexamethasone (DECADRON) 4 MG tablet Take 10 tablets (40 mg total) by mouth once a week. Patient taking differently: Take 20 mg by mouth once a week. Friday 03/05/21  Yes Orson Slick, MD  EPINEPHrine (EPI-PEN) 0.3 mg/0.3 mL DEVI Inject 0.3 mg into the muscle once as needed.   Yes [provider]  ezetimibe-simvastatin (VYTORIN) 10-40 MG per tablet Take 1 tablet by mouth at bedtime.   Yes [provider]  furosemide (LASIX) 20 MG tablet Take 1 tablet (20 mg total) by mouth daily. 06/11/21  Yes  Orson Slick, MD  levothyroxine (SYNTHROID) 150 MCG tablet Take 150 mcg by mouth every other day. Alternates with 175 mcg   Yes [provider]  levothyroxine (SYNTHROID) 175 MCG tablet Take 1 tablet by mouth every other day. 04/05/21  Yes [provider]  montelukast (SINGULAIR) 10 MG tablet Take 10 mg by mouth at bedtime.   Yes [provider]  Multiple Vitamin (MULTIVITAMIN) tablet Take 1 tablet by mouth daily.   Yes [provider]  omeprazole (PRILOSEC) 40 MG capsule Take 1 capsule by mouth daily. 03/05/21  Yes [provider]  ondansetron (ZOFRAN) 8 MG tablet Take 1 tablet (8 mg total) by mouth every 8 (eight) hours as needed for nausea or vomiting. 03/05/21  Yes Orson Slick, MD  prochlorperazine (COMPAZINE) 10 MG tablet Take 1 tablet (10 mg total) by mouth every 6 (six) hours as needed for nausea or vomiting. 03/05/21  Yes Orson Slick, MD  sildenafil (REVATIO) 20 MG tablet 2-5 tablets one hour prior to sexual intimacy 07/12/19  Yes [provider]  tamsulosin (FLOMAX) 0.4 MG CAPS capsule Take 0.4 mg by mouth daily.   Yes [provider]  triamcinolone cream (KENALOG) 0.1 % Apply topically. 01/15/21  Yes [provider]  Zinc 50 MG TABS Take by mouth.   Yes [provider]   lenalidomide ([name]) 25 MG capsule TAKE 1 CAPSULE DAILY FOR 14 DAYS ON, 7 DAYS OFF, REPEAT EVERY 21 DAYS [employer] Josem Kaufmann # [id number]  Date : 05/29/21 05/29/21   Orson Slick, MD     Critical care time: n/a     Rodman Pickle, M.D. [facility] Pulmonary/Critical Care Medicine 06/18/2021 12:18 PM   Please see Amion for pager number to reach on-call Pulmonary and Critical Care Team.

## 2021-06-18 NOTE — Discharge Summary (Signed)
Physician Discharge Summary  Samuel [name] HWE:[id number] DOB: June 05, 1956 DOA: 06/15/2021  PCP: Samuel Contras, MD  Admit date: 06/15/2021 Discharge date: 06/18/2021  Admitted From: Home Disposition:  Home  Recommendations for Outpatient Follow-up:  Follow up with PCP in 1 week Follow up with Pulmonology in 2-3 weeks   Discharge Condition: Stable CODE STATUS: Full  Diet recommendation: Heart healthy   Brief/Interim Summary: Samuel [name] is a 65 y.o. male with medical history significant for HLD, hypothyroidism, multiple myeloma followed by Oncology, mild intermittent asthma who presents for evaluation of SOB that is worsened with exertion.  He reports he has always had some shortness of breath when he exerts himself that generally in improves as he continues activities or exercise.  He states in the last few weeks has become more pronounced and has had more shortness of breath at rest.  When he arrived to the emergency room he had oxygen saturations in the mid 80s on room air with walking.  He has not had any fever or chills.  States he was told that asthma was a child and used an inhaler briefly when a child but had no treatment in his early adulthood until now.  He is reports a few weeks ago he was placed on albuterol and Breo due to his shortness of breath. He reports he does have a history of edema and takes Lasix intermittently but states he has never been told he had cardiac disease.  He states his father died of coronary artery disease.  He reports he has always had shortness of breath and feels like he has to try to take a big deep breath but feels constricting his chest when he first tries exercising but improves as he continues to do his activity.     Patient states that he has been short of breath all of his life, even as a child.  He had asthma as a child, had a couple of years where he had asthma attacks in his 63s.  He smoked tobacco briefly but has quit for decades now.  Denies  any other drug use, vaping, e-cigarettes.  He describes dyspnea with exertion that has always been present, but would improve with physical activity.  He has been very physically active in his adult life, working with physical trainers, golfing.  However in the last couple of weeks, his shortness of breath has progressed.  Dr. [name] with oncology who manages his multiple myeloma has initially tried Lasix 20 mg daily.  Despite urine output, this has not helped with his shortness of breath.  He denies any orthopnea or significant snoring.  He presented to Dr. Libby Maw office on 8/26 and was found to be satting in the 80s on room air and was sent to the emergency room for further evaluation.  He denies any fevers, chills, significant cough, nausea, vomiting, diarrhea or abdominal pain.  He feels well at rest, but states that he is not able to take more than a few steps before feeling short of breath.  Denies any wheezing.  He lives at home with wife, denies any recent international travel (went to the mountains in [place] recently).  He used to work as a Music therapist in an office setting, denies any significant work in [employer] or farm.  They own a small hypoallergenic dog, no other pets.  He has never experienced homelessness, never been imprisoned.  He has no cardiac disease that he is aware of other than hyperlipidemia.  He denies  any significant chest pain.  He does not use oxygen at baseline.  He does admit to seasonal allergies, sees an allergist.  Pulmonology was consulted.  He was started on IV steroids.  He underwent bronchoscopy on 8/29.  He was given instructions to follow-up closely with pulmonology as outpatient.  On day of discharge, patient was on room air.  Discharge Diagnoses:  Principal Problem:   Abnormal CT of the chest Active Problems:   Mild intermittent asthma   High cholesterol   Multiple myeloma (HCC)   Dyspnea   Hypoxia   Acute hypoxemic respiratory failure possibly  ILD versus drug-induced pneumonitis -Found to be satting in the mid 80s on room air and required 2 L oxygen -Now weaned and remained stable on room air -Echocardiogram without significant evidence of heart failure.  EF is 60%.  BNP 32. -Unclear etiology, CTA chest did reveal underlying interstitial lung disease. Possibly related to chemotherapy.  He is on bortezomib and [name] which was started in May 2022.  However, his shortness of breath and dyspnea on exertion preceded his treatment -Appreciate pulmonology, status post bronchoscopy 8/29 -Prednisone taper as outpatient   Multiple myeloma -Followed by Dr. [name] as an outpatient, currently undergoing chemotherapy -Continue acyclovir  Hyperlipidemia -Continue Vytorin   Anxiety -Continue Xanax as needed   BPH -Continue Flomax   Hypothyroidism -Continue Synthroid  Discharge Instructions  Discharge Instructions     Call MD for:  difficulty breathing, headache or visual disturbances   Complete by: As directed    Call MD for:  extreme fatigue   Complete by: As directed    Call MD for:  persistant dizziness or light-headedness   Complete by: As directed    Call MD for:  persistant nausea and vomiting   Complete by: As directed    Call MD for:  severe uncontrolled pain   Complete by: As directed    Call MD for:  temperature >100.4   Complete by: As directed    Discharge instructions   Complete by: As directed    You were cared for by a hospitalist during your hospital stay. If you have any questions about your discharge medications or the care you received while you were in the hospital after you are discharged, you can call the unit and ask to speak with the hospitalist on call if the hospitalist that took care of you is not available. Once you are discharged, your primary care physician will handle any further medical issues. Please note that NO REFILLS for any discharge medications will be authorized once you are discharged,  as it is imperative that you return to your primary care physician (or establish a relationship with a primary care physician if you do not have one) for your aftercare needs so that they can reassess your need for medications and monitor your lab values.   Increase activity slowly   Complete by: As directed       Allergies as of 06/18/2021       Reactions   Clarithromycin    Penicillins         Medication List     STOP taking these medications    dexamethasone 4 MG tablet Commonly known as: DECADRON       TAKE these medications    acyclovir 400 MG tablet Commonly known as: ZOVIRAX Take 1 tablet (400 mg total) by mouth 2 (two) times daily.   albuterol 108 (90 Base) MCG/ACT inhaler Commonly known as: VENTOLIN HFA Inhale 2  puffs into the lungs every 6 (six) hours as needed for wheezing or shortness of breath.   allopurinol 300 MG tablet Commonly known as: ZYLOPRIM Take 300 mg by mouth daily.   ALPRAZolam 1 MG tablet Commonly known as: XANAX Take 1 mg by mouth 2 (two) times daily as needed for anxiety.   aspirin EC 81 MG tablet Take 81 mg by mouth daily. Swallow whole.   Assess Full Range Peak Meter Devi Generic drug: Peak Flow Meter as directed.   Breo Ellipta 200-25 MCG/INH Aepb Generic drug: fluticasone furoate-vilanterol Inhale 1 puff into the lungs daily.   cholecalciferol 1000 units tablet Commonly known as: VITAMIN D Take 3,000 Units by mouth daily.   EPINEPHrine 0.3 mg/0.3 mL Devi Commonly known as: EPI-PEN Inject 0.3 mg into the muscle once as needed.   ezetimibe-simvastatin 10-40 MG tablet Commonly known as: VYTORIN Take 1 tablet by mouth at bedtime.   furosemide 20 MG tablet Commonly known as: LASIX Take 1 tablet (20 mg total) by mouth as needed for fluid or edema. What changed:  when to take this reasons to take this   lenalidomide 25 MG capsule Commonly known as: [name] TAKE 1 CAPSULE DAILY FOR 14 DAYS ON, 7 DAYS OFF, REPEAT EVERY  21 DAYS [employer] auth # [id number]  Date : 05/29/21   levothyroxine 150 MCG tablet Commonly known as: SYNTHROID Take 150 mcg by mouth every other day. Alternates with 175 mcg   levothyroxine 175 MCG tablet Commonly known as: SYNTHROID Take 1 tablet by mouth every other day.   montelukast 10 MG tablet Commonly known as: SINGULAIR Take 10 mg by mouth at bedtime.   multivitamin tablet Take 1 tablet by mouth daily.   omeprazole 40 MG capsule Commonly known as: PRILOSEC Take 1 capsule by mouth daily.   ondansetron 8 MG tablet Commonly known as: ZOFRAN Take 1 tablet (8 mg total) by mouth every 8 (eight) hours as needed for nausea or vomiting.   predniSONE 10 MG tablet Commonly known as: DELTASONE Take 5 tablets (50 mg total) by mouth daily with breakfast for 7 days, THEN 4 tablets (40 mg total) daily with breakfast for 7 days, THEN 3 tablets (30 mg total) daily with breakfast for 7 days, THEN 2 tablets (20 mg total) daily with breakfast for 7 days, THEN 1 tablet (10 mg total) daily with breakfast for 7 days, THEN 0.5 tablets (5 mg total) daily with breakfast for 7 days. Start taking on: June 19, 2021   prochlorperazine 10 MG tablet Commonly known as: COMPAZINE Take 1 tablet (10 mg total) by mouth every 6 (six) hours as needed for nausea or vomiting.   sildenafil 20 MG tablet Commonly known as: REVATIO 2-5 tablets one hour prior to sexual intimacy   tamsulosin 0.4 MG Caps capsule Commonly known as: FLOMAX Take 0.4 mg by mouth daily.   triamcinolone cream 0.1 % Commonly known as: KENALOG Apply topically.   Zinc 50 MG Tabs Take by mouth.        Follow-up Information     Juanito Doom, MD. Schedule an appointment as soon as possible for a visit in 3 week(s).   Specialty: Pulmonary Disease Contact information: [address] [place] [address] [phone]         Samuel Contras, MD Follow up.   Specialty: Family Medicine Contact information: [address], [place] [address] [phone]  Allergies  Allergen Reactions   Clarithromycin    Penicillins     Consultations: Pulmonology   Procedures/Studies: DG Chest 2 View  Result Date: 05/27/2021 CLINICAL DATA:  Shortness of breath.  Multiple myeloma. EXAM: CHEST - 2 VIEW COMPARISON:  February 02, 2021 FINDINGS: The heart size is borderline to mildly enlarged. The hila and mediastinum are normal. No pneumothorax. Diffuse interstitial opacities in the lungs are identified, more pronounced in the interval. No focal infiltrate, nodule, or mass. Colonic interposition between the dome of the liver in the right diaphragm. No other abnormalities. IMPRESSION: Findings are most in keeping with mild cardiomegaly and pulmonary edema. An atypical infection could have a similar appearance. Electronically Signed   By: Dorise Bullion III M.D   On: 05/27/2021 16:11   CT Angio Chest PE W/Cm &/Or Wo Cm  Result Date: 06/15/2021 CLINICAL DATA:  History of multiple myeloma with several days of increasing dyspnea upon exertion. EXAM: CT ANGIOGRAPHY CHEST WITH CONTRAST TECHNIQUE: Multidetector CT imaging of the chest was performed using the standard protocol during bolus administration of intravenous contrast. Multiplanar CT image reconstructions and MIPs were obtained to evaluate the vascular anatomy. CONTRAST:  85m OMNIPAQUE IOHEXOL 350 MG/ML SOLN COMPARISON:  None. FINDINGS: Cardiovascular: There is mild to moderate severity calcification of the aortic arch. The subsegmental pulmonary arteries are limited in evaluation secondary to areas of overlying artifact. No evidence of pulmonary embolism. Normal heart size with moderate severity coronary artery calcification. No pericardial effusion. Mediastinum/Nodes: Mild pretracheal, AP window and bilateral hilar lymphadenopathy is seen. Thyroid gland, trachea, and esophagus demonstrate no significant findings. Lungs/Pleura:  Moderate to marked severity diffusely thickened interstitium is noted bilaterally. Mild to moderate severity areas of atelectasis are seen along the periphery of both lungs. There is no evidence of a pleural effusion or pneumothorax. Upper Abdomen: No acute abnormality. Musculoskeletal: Degenerative changes are seen throughout the thoracic spine. Review of the MIP images confirms the above findings. IMPRESSION: 1. Diffuse, chronic appearing interstitial lung disease with associated areas of atelectasis. A superimposed component of pulmonary edema cannot be excluded. 2. No evidence of pulmonary embolism. 3. Moderate severity coronary artery disease. 4. Aortic atherosclerosis. Aortic Atherosclerosis (ICD10-I70.0). Electronically Signed   By: TVirgina NorfolkM.D.   On: 06/15/2021 17:16   DG CHEST PORT 1 VIEW  Result Date: 06/18/2021 CLINICAL DATA:  Status post bronchoscopy EXAM: PORTABLE CHEST 1 VIEW COMPARISON:  06/15/2021 FINDINGS: Stable cardiomediastinal contours. No pneumothorax following bronchoscopy. Lung volumes are low and there are diffuse interstitial markings, similar to previous exam. IMPRESSION: 1. No pneumothorax status post bronchoscopy. 2. Stable diffuse interstitial markings. Electronically Signed   By: TKerby MoorsM.D.   On: 06/18/2021 14:09   DG Chest Port 1 View  Result Date: 06/15/2021 CLINICAL DATA:  Shortness of breath. EXAM: PORTABLE CHEST 1 VIEW COMPARISON:  05/25/2021 FINDINGS: Bilateral low lung volumes. Diffuse fine interstitial lung densities are again noted. Heart size is grossly stable but accentuated by the low lung volumes. No large areas of consolidation. Negative for a pneumothorax. IMPRESSION: Low lung volumes with persistent diffuse parenchymal lung densities. Lung disease has minimally changed. Electronically Signed   By: AMarkus DaftM.D.   On: 06/15/2021 15:25   ECHOCARDIOGRAM COMPLETE  Result Date: 06/16/2021    ECHOCARDIOGRAM REPORT   Patient Name:   Samuel PAPADAKISDate of Exam: 06/16/2021 Medical Rec #:  [id number]      Height:       70.0 in Accession #:  [id number]      Weight:       214.3 lb Date of Birth:  1956/02/05       BSA:          2.149 m Patient Age:    30 years        BP:           157/89 mmHg Patient Gender: M               HR:           67 bpm. Exam Location:  Inpatient Procedure: 2D Echo, 3D Echo, Cardiac Doppler and Color Doppler Indications:    R06.02 SOB  History:        Patient has no prior history of Echocardiogram examinations.                 Signs/Symptoms:Dyspnea and Shortness of Breath; Risk                 Factors:Dyslipidemia. Myeloma.  Sonographer:    Roseanna Rainbow RDCS Referring Phys: [id number] [facility]  Sonographer Comments: Suboptimal apical window. IMPRESSIONS  1. Left ventricular ejection fraction, by estimation, is 65 to 70%. The left ventricle has normal function. The left ventricle has no regional wall motion abnormalities. There is mild left ventricular hypertrophy. Left ventricular diastolic parameters are indeterminate. Elevated left atrial pressure.  2. Right ventricular systolic function is normal. The right ventricular size is normal. Tricuspid regurgitation signal is inadequate for assessing PA pressure.  3. The pericardial effusion is circumferential.  4. The mitral valve is normal in structure. No evidence of mitral valve regurgitation. No evidence of mitral stenosis.  5. The aortic valve has an indeterminant number of cusps. Aortic valve regurgitation is not visualized. No aortic stenosis is present.  6. Aortic dilatation noted. There is mild dilatation of the ascending aorta, measuring 38 mm. FINDINGS  Left Ventricle: Left ventricular ejection fraction, by estimation, is 65 to 70%. The left ventricle has normal function. The left ventricle has no regional wall motion abnormalities. The left ventricular internal cavity size was normal in size. There is  mild left ventricular hypertrophy. Left ventricular diastolic parameters  are indeterminate. Elevated left atrial pressure. Right Ventricle: The right ventricular size is normal. No increase in right ventricular wall thickness. Right ventricular systolic function is normal. Tricuspid regurgitation signal is inadequate for assessing PA pressure. Left Atrium: Left atrial size was normal in size. Right Atrium: Right atrial size was normal in size. Pericardium: Trivial pericardial effusion is present. The pericardial effusion is circumferential. Mitral Valve: The mitral valve is normal in structure. There is mild thickening of the mitral valve leaflet(s). There is mild calcification of the mitral valve leaflet(s). Mild mitral annular calcification. No evidence of mitral valve regurgitation. No evidence of mitral valve stenosis. Tricuspid Valve: The tricuspid valve is normal in structure. Tricuspid valve regurgitation is trivial. No evidence of tricuspid stenosis. Aortic Valve: The aortic valve has an indeterminant number of cusps. Aortic valve regurgitation is not visualized. No aortic stenosis is present. Aortic valve mean gradient measures 6.3 mmHg. Aortic valve peak gradient measures 11.5 mmHg. Aortic valve area, by VTI measures 2.02 cm. Pulmonic Valve: The pulmonic valve was not well visualized. Pulmonic valve regurgitation is not visualized. No evidence of pulmonic stenosis. Aorta: The aortic root is normal in size and structure and aortic dilatation noted. There is mild dilatation of the ascending aorta, measuring 38 mm. Venous: The inferior vena cava was not well visualized. IAS/Shunts:  The interatrial septum was not well visualized.  LEFT VENTRICLE PLAX 2D LVIDd:         4.90 cm      Diastology LVIDs:         2.70 cm      LV e' medial:    6.20 cm/s LV PW:         1.20 cm      LV E/e' medial:  15.3 LV IVS:        1.10 cm      LV e' lateral:   6.53 cm/s LVOT diam:     1.90 cm      LV E/e' lateral: 14.5 LV SV:         81 LV SV Index:   38 LVOT Area:     2.84 cm  LV Volumes (MOD) LV vol  d, MOD A2C: 123.0 ml LV vol d, MOD A4C: 85.4 ml LV vol s, MOD A2C: 30.0 ml LV vol s, MOD A4C: 21.9 ml LV SV MOD A2C:     93.0 ml LV SV MOD A4C:     85.4 ml LV SV MOD BP:      76.9 ml RIGHT VENTRICLE             IVC RV S prime:     15.00 cm/s  IVC diam: 1.50 cm TAPSE (M-mode): 2.1 cm LEFT ATRIUM             Index       RIGHT ATRIUM           Index LA diam:        4.60 cm 2.14 cm/m  RA Area:     14.30 cm LA Vol (A2C):   63.6 ml 29.59 ml/m RA Volume:   33.80 ml  15.73 ml/m LA Vol (A4C):   25.6 ml 11.91 ml/m LA Biplane Vol: 41.6 ml 19.35 ml/m  AORTIC VALVE AV Area (Vmax):    2.26 cm AV Area (Vmean):   2.06 cm AV Area (VTI):     2.02 cm AV Vmax:           169.40 cm/s AV Vmean:          119.341 cm/s AV VTI:            0.403 m AV Peak Grad:      11.5 mmHg AV Mean Grad:      6.3 mmHg LVOT Vmax:         135.00 cm/s LVOT Vmean:        86.900 cm/s LVOT VTI:          0.287 m LVOT/AV VTI ratio: 0.71  AORTA Ao Root diam: 3.00 cm Ao Asc diam:  3.80 cm MITRAL VALVE MV Area (PHT): 3.08 cm    SHUNTS MV Decel Time: 246 msec    Systemic VTI:  0.29 m MV E velocity: 94.70 cm/s  Systemic Diam: 1.90 cm MV A velocity: 80.10 cm/s MV E/A ratio:  1.18 Carlyle Dolly MD Electronically signed by Carlyle Dolly MD Signature Date/Time: 06/16/2021/10:32:36 AM    Final    DG C-ARM BRONCHOSCOPY  Result Date: 06/18/2021 C-ARM BRONCHOSCOPY: Fluoroscopy was utilized by the requesting physician.  No radiographic interpretation.       Discharge Exam: Vitals:   06/18/21 1340 06/18/21 1415  BP:  (!) 156/96  Pulse: 67 (!) 58  Resp: 19 18  Temp:  98 F (36.7 C)  SpO2: 94% 96%    General: Pt is alert,  awake, not in acute distress Cardiovascular: RRR, S1/S2 +, no edema Respiratory: Fine crackles throughout without wheezes, no respiratory distress and no conversational dyspnea, on room air Abdominal: Soft, NT, ND, bowel sounds + Extremities: no edema, no cyanosis Psych: Normal mood and affect, stable judgement and insight      The results of significant diagnostics from this hospitalization (including imaging, microbiology, ancillary and laboratory) are listed below for reference.     Microbiology: Recent Results (from the past 240 hour(s))  SARS CORONAVIRUS 2 (TAT 6-24 HRS) Nasopharyngeal Nasopharyngeal Swab     Status: None   Collection Time: 06/15/21  8:08 PM   Specimen: Nasopharyngeal Swab  Result Value Ref Range Status   SARS Coronavirus 2 NEGATIVE NEGATIVE Final    Comment: (NOTE) SARS-CoV-2 target nucleic acids are NOT DETECTED.  The SARS-CoV-2 RNA is generally detectable in upper and lower respiratory specimens during the acute phase of infection. Negative results do not preclude SARS-CoV-2 infection, do not rule out co-infections with other pathogens, and should not be used as the sole basis for treatment or other patient management decisions. Negative results must be combined with clinical observations, patient history, and epidemiological information. The expected result is Negative.  Fact Sheet for Patients: SugarRoll.be  Fact Sheet for Healthcare Providers: https://www.woods-mathews.com/  This test is not yet approved or cleared by the [place] FDA and  has been authorized for detection and/or diagnosis of SARS-CoV-2 by FDA under an Emergency Use Authorization (EUA). This EUA will remain  in effect (meaning this test can be used) for the duration of the COVID-19 declaration under Se ction 564(b)(1) of the Act, 21 U.S.C. section 360bbb-3(b)(1), unless the authorization is terminated or revoked sooner.  Performed at [facility], [place] [address], [place], [place] [address]      Labs: BNP (last 3 results) Recent Labs    06/15/21 1416  BNP 66.8   Basic Metabolic Panel: Recent Labs  Lab 06/15/21 1124 06/15/21 1416 06/16/21 0727  NA 127* 132* 135  K 4.4 4.4 4.1  CL 96* 98 101  CO2 _0 GLUCOSE 106* 147* 128*   BUN _1 CREATININE 1.01 0.87 0.66  CALCIUM 8.8* 8.8* 8.7*   Liver Function Tests: Recent Labs  Lab 06/15/21 1124  AST 20  ALT 18  ALKPHOS 64  BILITOT 0.5  PROT 6.9  ALBUMIN 3.3*   No results for input(s): LIPASE, AMYLASE in the last 168 hours. No results for input(s): AMMONIA in the last 168 hours. CBC: Recent Labs  Lab 06/15/21 1124 06/15/21 1416 06/16/21 0727  WBC 10.1 8.4 10.9*  NEUTROABS 9.0* 7.7  --   HGB 12.6* 13.1 12.3*  HCT 36.3* 38.2* 35.9*  MCV 90.8 92.5 91.8  PLT 203 216 238   Cardiac Enzymes: No results for input(s): CKTOTAL, CKMB, CKMBINDEX, TROPONINI in the last 168 hours. BNP: Invalid input(s): POCBNP CBG: No results for input(s): GLUCAP in the last 168 hours. D-Dimer No results for input(s): DDIMER in the last 72 hours. Hgb A1c No results for input(s): HGBA1C in the last 72 hours. Lipid Profile No results for input(s): CHOL, HDL, LDLCALC, TRIG, CHOLHDL, LDLDIRECT in the last 72 hours. Thyroid function studies No results for input(s): TSH, T4TOTAL, T3FREE, THYROIDAB in the last 72 hours.  Invalid input(s): FREET3 Anemia work up No results for input(s): VITAMINB12, FOLATE, FERRITIN, TIBC, IRON, RETICCTPCT in the last 72 hours. Urinalysis No results found for: COLORURINE, APPEARANCEUR, [place], [place], [place], [place], [place], [place],  PROTEINUR, UROBILINOGEN, NITRITE, LEUKOCYTESUR Sepsis Labs Invalid input(s): PROCALCITONIN,  WBC,  LACTICIDVEN Microbiology Recent Results (from the past 240 hour(s))  SARS CORONAVIRUS 2 (TAT 6-24 HRS) Nasopharyngeal Nasopharyngeal Swab     Status: None   Collection Time: 06/15/21  8:08 PM   Specimen: Nasopharyngeal Swab  Result Value Ref Range Status   SARS Coronavirus 2 NEGATIVE NEGATIVE Final    Comment: (NOTE) SARS-CoV-2 target nucleic acids are NOT DETECTED.  The SARS-CoV-2 RNA is generally detectable in upper and lower respiratory specimens during the acute phase of infection.  Negative results do not preclude SARS-CoV-2 infection, do not rule out co-infections with other pathogens, and should not be used as the sole basis for treatment or other patient management decisions. Negative results must be combined with clinical observations, patient history, and epidemiological information. The expected result is Negative.  Fact Sheet for Patients: SugarRoll.be  Fact Sheet for Healthcare Providers: https://www.woods-mathews.com/  This test is not yet approved or cleared by the [place] FDA and  has been authorized for detection and/or diagnosis of SARS-CoV-2 by FDA under an Emergency Use Authorization (EUA). This EUA will remain  in effect (meaning this test can be used) for the duration of the COVID-19 declaration under Se ction 564(b)(1) of the Act, 21 U.S.C. section 360bbb-3(b)(1), unless the authorization is terminated or revoked sooner.  Performed at [facility], [place] [address] [place], [place] [address]      Patient was seen and examined on the day of discharge and was found to be in stable condition. Time coordinating discharge: 35 minutes including assessment and coordination of care, as well as examination of the patient.   SIGNED:  Dessa Phi, DO [facility] 06/18/2021, 4:19 PM

## 2021-06-18 NOTE — Op Note (Signed)
Video Bronchoscopy Procedure Note  Date of Operation: 06/18/2021  Pre-op Diagnosis: Bilateral pulmonary infiltrates  Post-op Diagnosis: Same  Surgeon: Baltazar Apo  Assistants: none  Anesthesia: Deep sedation with propofol and anesthesia monitoring  Operation: Flexible video fiberoptic bronchoscopy and biopsies.  Estimated Blood Loss: 10 cc  Complications: none noted  Indications and History: MONTGOMERY FAVOR is 65 y.o. with history of multiple myeloma on [name], admitted with diffuse bilateral pulmonary infiltrates, groundglass with associate interstitial prominence.  Recommendation was to perform video fiberoptic bronchoscopy with biopsies. The risks, benefits, complications, treatment options and expected outcomes were discussed with the patient.  The possibilities of pneumothorax, pneumonia, reaction to medication, pulmonary aspiration, perforation of a viscus, bleeding, failure to diagnose a condition and creating a complication requiring transfusion or operation were discussed with the patient who freely signed the consent.    Description of Procedure: The patient was seen in the Preoperative Area, was examined and was deemed appropriate to proceed.  The patient was taken to [facility] room for, identified as KARLTON MAYA and the procedure verified as Flexible Video Fiberoptic Bronchoscopy.  A Time Out was held and the above information confirmed.   Deep sedation was initiated by anesthesia with propofol.  The video fiberoptic bronchoscope was introduced via the L nare and a general inspection was performed which showed normal cords, normal trachea, normal main carina. The R sided airways were inspected and showed normal RUL, BI, RML and RLL. The L side was then inspected. The LLL, Lingular and LUL airways were normal.   Bronchoalveolar lavage was performed in the anterior segment of the right upper lobe with 60 cc normal saline instilled, approximately 24 cc returned.  This will  be sent for cytology, cell count, microbiology.  Under fluoroscopic guidance transbronchial biopsies were performed in the left upper lobe to be sent for pathology.  The patient tolerated the procedure well. The bronchoscope was removed. There were no obvious complications.  A postprocedural chest x-ray is pending.  Samples: 1.  Transbronchial biopsies from the left upper lobe 2.  Bronchoalveolar lavage from the right upper lobe   Plans:  We will review the cytology, pathology and microbiology results with the patient when they become available.  Patient will be transferred back to his hospita room after recovery and evaluation of CXR.   Baltazar Apo, MD, PhD 06/18/2021, 1:16 PM [facility] [phone] or if no answer [phone]

## 2021-06-18 NOTE — Transfer of Care (Signed)
Immediate Anesthesia Transfer of Care Note  Patient: Samuel [name]  Procedure(s) Performed: VIDEO BRONCHOSCOPY WITH FLUORO BRONCHIAL WASHINGS BRONCHIAL BIOPSIES  Patient Location: Endoscopy Unit  Anesthesia Type:MAC  Level of Consciousness: awake, alert , oriented and patient cooperative  Airway & Oxygen Therapy: Patient Spontanous Breathing  Post-op Assessment: Report given to RN, Post -op Vital signs reviewed and stable and Patient moving all extremities  Post vital signs: Reviewed and stable  Last Vitals:  Vitals Value Taken Time  BP 150/85 06/18/21 1324  Temp    Pulse 73 06/18/21 1325  Resp 19 06/18/21 1325  SpO2 95 % 06/18/21 1325  Vitals shown include unvalidated device data.  Last Pain:  Vitals:   06/18/21 1157  TempSrc: Oral  PainSc: 0-No pain         Complications: No notable events documented.

## 2021-06-18 NOTE — Progress Notes (Signed)
Mobility Specialist - Progress Note    06/18/21 1550  Mobility  Activity Ambulated in hall  Level of Assistance Independent  Assistive Device None  Distance Ambulated (ft) 160 ft  Mobility Ambulated independently in hallway  Mobility Response Tolerated well  Mobility performed by Mobility specialist  $Mobility charge 1 Mobility    Post-mobility: 68 HR, 90% SPO2  Upon entry pt was not connected to O2 and was agreeable to ambulate. Pt ambulated ~160 ft in hallway using no assistive device and did not c/o of pain, dizziness, or SOB. Pt returned to bed after session and was left with call bell at side and family in room.   [place] [facility] Phone: [phone] 06/18/21, 3:52 PM

## 2021-06-20 ENCOUNTER — Telehealth: Payer: Self-pay

## 2021-06-20 ENCOUNTER — Encounter: Payer: Self-pay | Admitting: Hematology and Oncology

## 2021-06-20 ENCOUNTER — Telehealth: Payer: Self-pay | Admitting: *Deleted

## 2021-06-20 LAB — CYTOLOGY - NON PAP

## 2021-06-20 NOTE — Telephone Encounter (Signed)
Pt aware of appt.

## 2021-06-20 NOTE — Telephone Encounter (Signed)
-----   Message from [place], MD sent at 06/18/2021  2:25 PM EDT ----- Regarding: Re-establish care Patient was previously seen by Dr. [name] >10 years ago. 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 On steroid taper.  Please schedule for hospital follow-up in 3 weeks.  If no availability with Dr. [name], please schedule with myself or any provider with 30 minute slot.

## 2021-06-20 NOTE — Telephone Encounter (Signed)
Noted  

## 2021-06-20 NOTE — Telephone Encounter (Signed)
Received call from pt. He was recently discharged from [facility]. He states he feels much better-he is actually calling from work today. He is calling to confirm appt on Friday and to make sure that he is not to start his [name] yet. Confirmed both of these. Advised that Dr. [name] needs to review his bronchoscopy results before resuming [name]. Advised he will also advise on whether or not to resume [name] on Friday as well.  Informed pt that I had not sent in his [name] prescription yet-pending decision from Dr. [name]. Pt voiced understanding.

## 2021-06-20 NOTE — Telephone Encounter (Signed)
LMTCB  Appt scheduled for 07/12/21. Need to confirm with patient.   Will route to triage to try back at a later time.

## 2021-06-21 LAB — ANAEROBIC CULTURE W GRAM STAIN

## 2021-06-21 LAB — CULTURE, RESPIRATORY W GRAM STAIN: Culture: NORMAL

## 2021-06-22 ENCOUNTER — Other Ambulatory Visit: Payer: Self-pay

## 2021-06-22 ENCOUNTER — Inpatient Hospital Stay: Payer: Managed Care, Other (non HMO)

## 2021-06-22 ENCOUNTER — Other Ambulatory Visit: Payer: Self-pay | Admitting: Hematology and Oncology

## 2021-06-22 ENCOUNTER — Inpatient Hospital Stay: Payer: Managed Care, Other (non HMO) | Attending: Hematology and Oncology | Admitting: Hematology and Oncology

## 2021-06-22 VITALS — BP 133/84 | HR 66 | Temp 96.8°F | Resp 18 | Wt 211.4 lb

## 2021-06-22 DIAGNOSIS — E079 Disorder of thyroid, unspecified: Secondary | ICD-10-CM | POA: Diagnosis not present

## 2021-06-22 DIAGNOSIS — Z7951 Long term (current) use of inhaled steroids: Secondary | ICD-10-CM | POA: Diagnosis not present

## 2021-06-22 DIAGNOSIS — C9000 Multiple myeloma not having achieved remission: Secondary | ICD-10-CM

## 2021-06-22 DIAGNOSIS — Z7952 Long term (current) use of systemic steroids: Secondary | ICD-10-CM | POA: Insufficient documentation

## 2021-06-22 DIAGNOSIS — Z79899 Other long term (current) drug therapy: Secondary | ICD-10-CM | POA: Insufficient documentation

## 2021-06-22 DIAGNOSIS — E7800 Pure hypercholesterolemia, unspecified: Secondary | ICD-10-CM | POA: Diagnosis not present

## 2021-06-22 DIAGNOSIS — M899 Disorder of bone, unspecified: Secondary | ICD-10-CM | POA: Diagnosis not present

## 2021-06-22 DIAGNOSIS — Z5111 Encounter for antineoplastic chemotherapy: Secondary | ICD-10-CM | POA: Insufficient documentation

## 2021-06-22 DIAGNOSIS — I2510 Atherosclerotic heart disease of native coronary artery without angina pectoris: Secondary | ICD-10-CM | POA: Diagnosis not present

## 2021-06-22 DIAGNOSIS — Z7982 Long term (current) use of aspirin: Secondary | ICD-10-CM | POA: Insufficient documentation

## 2021-06-22 DIAGNOSIS — Z5112 Encounter for antineoplastic immunotherapy: Secondary | ICD-10-CM | POA: Diagnosis present

## 2021-06-22 LAB — CBC WITH DIFFERENTIAL (CANCER CENTER ONLY)
Abs Immature Granulocytes: 0.09 10*3/uL — ABNORMAL HIGH (ref 0.00–0.07)
Basophils Absolute: 0 10*3/uL (ref 0.0–0.1)
Basophils Relative: 0 %
Eosinophils Absolute: 0 10*3/uL (ref 0.0–0.5)
Eosinophils Relative: 0 %
HCT: 35.8 % — ABNORMAL LOW (ref 39.0–52.0)
Hemoglobin: 12.5 g/dL — ABNORMAL LOW (ref 13.0–17.0)
Immature Granulocytes: 1 %
Lymphocytes Relative: 4 %
Lymphs Abs: 0.7 10*3/uL (ref 0.7–4.0)
MCH: 31.7 pg (ref 26.0–34.0)
MCHC: 34.9 g/dL (ref 30.0–36.0)
MCV: 90.9 fL (ref 80.0–100.0)
Monocytes Absolute: 0.6 10*3/uL (ref 0.1–1.0)
Monocytes Relative: 4 %
Neutro Abs: 13.6 10*3/uL — ABNORMAL HIGH (ref 1.7–7.7)
Neutrophils Relative %: 91 %
Platelet Count: 293 10*3/uL (ref 150–400)
RBC: 3.94 MIL/uL — ABNORMAL LOW (ref 4.22–5.81)
RDW: 15.6 % — ABNORMAL HIGH (ref 11.5–15.5)
WBC Count: 15 10*3/uL — ABNORMAL HIGH (ref 4.0–10.5)
nRBC: 0 % (ref 0.0–0.2)

## 2021-06-22 LAB — CMP (CANCER CENTER ONLY)
ALT: 74 U/L — ABNORMAL HIGH (ref 0–44)
AST: 23 U/L (ref 15–41)
Albumin: 3.3 g/dL — ABNORMAL LOW (ref 3.5–5.0)
Alkaline Phosphatase: 60 U/L (ref 38–126)
Anion gap: 9 (ref 5–15)
BUN: 27 mg/dL — ABNORMAL HIGH (ref 8–23)
CO2: 25 mmol/L (ref 22–32)
Calcium: 8.6 mg/dL — ABNORMAL LOW (ref 8.9–10.3)
Chloride: 98 mmol/L (ref 98–111)
Creatinine: 0.94 mg/dL (ref 0.61–1.24)
GFR, Estimated: >60 mL/min (ref >60)
Glucose, Bld: 103 mg/dL — ABNORMAL HIGH (ref 70–99)
Potassium: 4.6 mmol/L (ref 3.5–5.1)
Sodium: 132 mmol/L — ABNORMAL LOW (ref 135–145)
Total Bilirubin: 0.4 mg/dL (ref 0.3–1.2)
Total Protein: 6.6 g/dL (ref 6.5–8.1)

## 2021-06-22 LAB — LACTATE DEHYDROGENASE: LDH: 372 U/L — ABNORMAL HIGH (ref 98–192)

## 2021-06-22 NOTE — Progress Notes (Signed)
DISCONTINUE ON PATHWAY REGIMEN - Multiple Myeloma and Other Plasma Cell Dyscrasias     A cycle is every 21 days:     Bortezomib      Lenalidomide      Dexamethasone   **Always confirm dose/schedule in your pharmacy ordering system**  REASON: Toxicities / Adverse Event PRIOR TREATMENT: [id number]: VRd (Bortezomib 1.3 mg/m2 SUBQ D1, 8, 15 + Lenalidomide 25 mg + Dexamethasone 40 mg) q21 Days x 4-6 Cycles Maximum Prior to St[employer] TREATMENT RESPONSE: Partial Response (PR)  START ON PATHWAY REGIMEN - Multiple Myeloma and Other Plasma Cell Dyscrasias     A cycle is every 28 days:     Dexamethasone      Bortezomib      Cyclophosphamide   **Always confirm dose/schedule in your pharmacy ordering system**  Patient Characteristics: Multiple Myeloma, Newly Diagnosed, Transplant Eligible, Standard Risk Disease Classification: Multiple Myeloma R-ISS Staging: II Therapeutic Status: Newly Diagnosed Is Patient Eligible for Transplant<= Transplant Eligible Risk Status: Standard Risk Intent of Therapy: Curative Intent, Discussed with Patient

## 2021-06-22 NOTE — Progress Notes (Signed)
Samuel [name]:(336) [phone]   Fax:(336) [phone]  PROGRESS NOTE  Patient Care Team: Samuel Contras, MD as PCP - General (Family Medicine)  Hematological/Oncological History # IgG Kappa Multiple Myeloma 02/02/2021:  Presented to [place] ED due to right sided flank tenderness with bruising. CT abdomen/pelvis: Multiple small lytic lesions in the thoracolumbar spine and bilateral pelvis -SPEP: IgG 2,082 (H), M Protein 1.8 (H). IFE shows IgG monoclonal protein with kappa light chain specificity.  -LDH 169, CBC normal, CMP normal except for sodium 131 (L), Chloride 96 (L).   02/14/2021: Establish care with Samuel Query PA-C 02/22/2021: bone marrow biopsy confirms the diagnosis of Multiple Myeloma with a monoclonal plasma cell population.  03/16/2021: Cycle 1 Day 1 of VRd chemotherapy  04/06/2021: Cycle 2 Day 1 of VRd chemotherapy  04/27/2021: Cycle 3 Day 1 of VRd chemotherapy  05/18/2021: Cycle 4 Day 1 of VRd chemotherapy  06/01/2021: drop dexamethasone to 60m PO weekly and start lasix due to shortness of breath.  06/15/2021: Desaturation to 87% on ambulation. HELD velcade today and sent to ED for evaluation.  06/22/2021: Findings consistent with drug reaction the lungs with eosinophils on BAL.  Given these findings we will definitely hold [name] and plan to avoid pomalidomide 06/29/2021: Intended start of CyBorD chemotherapy  Interval History:  Samuel AguinagaTosco 65y.o. male with medical history significant for IgG Kappa multiple myeloma who presents for a follow up visit. The patient's last visit was on 06/18/2021 while he was in the hospital.  In the interim since last visit he underwent a BAL which showed clear evidence of drug reaction in the lungs.  He had marked elevation in eosinophils and was started on a steroid taper.  [name] was thought to be the culprit for his symptoms.  On exam today Samuel [name] accompanied by his wife. He has had marked improvement in his shortness  of breath.  He notes he [name] not back to 100% but [name] much better.  He notes that he [name] slowly began to improve and trying to exercise.  He also reports he [name] going to try golfing this weekend.  He has of the steroids has boosted his energy but that he [name] having trouble sleeping.  He notes he did have some swelling of his elbow but otherwise has been tolerating steroid therapy well.  He would like to continue the Lasix 20 mg p.o. daily in order to help decrease his fluid levels.  Today we discussed the need to transition to CyBorD chemotherapy and he was willing and able to proceed with treatment.  He has had no issues with bleeding, bruising, or dark stools.  Denies any fevers, chills, sweats, nausea, vomiting or diarrhea.  A full 10 point ROS [name] listed below.  MEDICAL HISTORY:  Past Medical History:  Diagnosis Date   Asthma    High cholesterol    under control.    Perennial allergic rhinitis    Sciatic pain, right    Seasonal allergic rhinitis    Thyroid disease     SURGICAL HISTORY: Past Surgical History:  Procedure Laterality Date   BRONCHIAL BIOPSY  06/18/2021   Procedure: BRONCHIAL BIOPSIES;  Surgeon: BCollene Gobble MD;  Location: [facility];  Service: Cardiopulmonary;;   BRONCHIAL WASHINGS  06/18/2021   Procedure: BRONCHIAL WASHINGS;  Surgeon: BCollene Gobble MD;  Location: [facility];  Service: Cardiopulmonary;;   [place] 06/18/2021  Procedure: VIDEO BRONCHOSCOPY WITH FLUORO;  Surgeon: Collene Gobble, MD;  Location: [facility];  Service: Cardiopulmonary;  Laterality: N/A;    SOCIAL HISTORY: Social History   Socioeconomic History   Marital status: Married    Spouse name: Not on file   Number of children: Not on file   Years of education: Not on file   Highest education level: Not on file  Occupational History   Not on file  Tobacco Use   Smoking status: Former    Packs/day: 0.10    Years: 5.00    Pack  years: 0.50    Types: Cigarettes    Quit date: 10/21/1982    Years since quitting: 38.6   Smokeless tobacco: Never  Substance and Sexual Activity   Alcohol use: Yes    Comment: 1 drink daily   Drug use: No   Sexual activity: Not on file  Other Topics Concern   Not on file  Social History Narrative   Not on file   Social Determinants of Health   Financial Resource Strain: Not on file  Food Insecurity: Not on file  Transportation Needs: Not on file  Physical Activity: Not on file  Stress: Not on file  Social Connections: Not on file  Intimate Partner Violence: Not on file    FAMILY HISTORY: Family History  Problem Relation Age of Onset   Allergies Father    Allergies Mother    Hypertension Other    Heart disease Other    Breast cancer Paternal Grandmother     ALLERGIES:  [name] allergic to clarithromycin and penicillins.  MEDICATIONS:  Current Outpatient Medications  Medication Sig Dispense Refill   acyclovir (ZOVIRAX) 400 MG tablet Take 1 tablet (400 mg total) by mouth 2 (two) times daily. 60 tablet 3   albuterol (VENTOLIN HFA) 108 (90 Base) MCG/ACT inhaler Inhale 2 puffs into the lungs every 6 (six) hours as needed for wheezing or shortness of breath. 8 g 1   allopurinol (ZYLOPRIM) 300 MG tablet Take 300 mg by mouth daily.     ALPRAZolam (XANAX) 1 MG tablet Take 1 mg by mouth 2 (two) times daily as needed for anxiety.     aspirin EC 81 MG tablet Take 81 mg by mouth daily. Swallow whole.     ASSESS FULL RANGE PEAK METER DEVI as directed.     BREO ELLIPTA 200-25 MCG/INH AEPB Inhale 1 puff into the lungs daily.     cholecalciferol (VITAMIN D) 1000 UNITS tablet Take 3,000 Units by mouth daily.     EPINEPHrine (EPI-PEN) 0.3 mg/0.3 mL DEVI Inject 0.3 mg into the muscle once as needed.     ezetimibe-simvastatin (VYTORIN) 10-40 MG per tablet Take 1 tablet by mouth at bedtime.     furosemide (LASIX) 20 MG tablet Take 1 tablet (20 mg total) by mouth as needed for fluid or edema. 14  tablet 1   lenalidomide ([name]) 25 MG capsule TAKE 1 CAPSULE DAILY FOR 14 DAYS ON, 7 DAYS OFF, REPEAT EVERY 21 DAYS [employer] auth # [id number]  Date : 05/29/21 14 capsule 0   levothyroxine (SYNTHROID) 150 MCG tablet Take 150 mcg by mouth every other day. Alternates with 175 mcg     levothyroxine (SYNTHROID) 175 MCG tablet Take 1 tablet by mouth every other day.     montelukast (SINGULAIR) 10 MG tablet Take 10 mg by mouth at bedtime.     Multiple Vitamin (MULTIVITAMIN) tablet Take 1 tablet by mouth daily.  omeprazole (PRILOSEC) 40 MG capsule Take 1 capsule by mouth daily.     ondansetron (ZOFRAN) 8 MG tablet Take 1 tablet (8 mg total) by mouth every 8 (eight) hours as needed for nausea or vomiting. 30 tablet 0   predniSONE (DELTASONE) 10 MG tablet Take 5 tablets (50 mg total) by mouth daily with breakfast for 7 days, THEN 4 tablets (40 mg total) daily with breakfast for 7 days, THEN 3 tablets (30 mg total) daily with breakfast for 7 days, THEN 2 tablets (20 mg total) daily with breakfast for 7 days, THEN 1 tablet (10 mg total) daily with breakfast for 7 days, THEN 0.5 tablets (5 mg total) daily with breakfast for 7 days. 108.5 tablet 0   prochlorperazine (COMPAZINE) 10 MG tablet Take 1 tablet (10 mg total) by mouth every 6 (six) hours as needed for nausea or vomiting. 30 tablet 0   sildenafil (REVATIO) 20 MG tablet 2-5 tablets one hour prior to sexual intimacy     tamsulosin (FLOMAX) 0.4 MG CAPS capsule Take 0.4 mg by mouth daily.     triamcinolone cream (KENALOG) 0.1 % Apply topically.     Zinc 50 MG TABS Take by mouth.     No current facility-administered medications for this visit.    REVIEW OF SYSTEMS:   Constitutional: ( - ) fevers, ( - )  chills , ( - ) night sweats Eyes: ( - ) blurriness of vision, ( - ) double vision, ( - ) watery eyes Ears, nose, mouth, throat, and face: ( - ) mucositis, ( - ) sore throat Respiratory: ( - ) cough, ( - ) dyspnea, ( - ) wheezes Cardiovascular: ( - )  palpitation, ( - ) chest discomfort, ( - ) lower extremity swelling Gastrointestinal:  ( - ) nausea, ( - ) heartburn, ( - ) change in bowel habits Skin: ( - ) abnormal skin rashes Lymphatics: ( - ) new lymphadenopathy, ( - ) easy bruising Neurological: ( - ) numbness, ( - ) tingling, ( - ) new weaknesses Behavioral/Psych: ( - ) mood change, ( - ) new changes  All other systems were reviewed with the patient and are negative.  PHYSICAL EXAMINATION: ECOG PERFORMANCE STATUS: 0 - Asymptomatic  Vitals:   06/22/21 1311  BP: 133/84  Pulse: 66  Resp: 18  Temp: (!) 96.8 F (36 C)  SpO2: (!) 7%     Filed Weights   06/22/21 1311  Weight: 211 lb 7 oz (95.9 kg)    GENERAL: well appearing middle aged Caucasian male alert, no distress and comfortable SKIN: skin color, texture, turgor are normal, no rashes or significant lesions EYES: conjunctiva are pink and non-injected, sclera clear LUNGS: No overt fine crackles but some coarse lung sounds.  No evidence of fluid overload.  Improved airflow from prior. HEART: regular rate & rhythm and no murmurs and no lower extremity edema Musculoskeletal: no cyanosis of digits and no clubbing  PSYCH: alert & oriented x 3, fluent speech NEURO: no focal motor/sensory deficits  LABORATORY DATA:  I have reviewed the data as listed CBC Latest Ref Rng & Units 06/22/2021 06/16/2021 06/15/2021  WBC 4.0 - 10.5 K/uL 15.0(H) 10.9(H) 8.4  Hemoglobin 13.0 - 17.0 g/dL 12.5(L) 12.3(L) 13.1  Hematocrit 39.0 - 52.0 % 35.8(L) 35.9(L) 38.2(L)  Platelets 150 - 400 K/uL 293 238 216    CMP Latest Ref Rng & Units 06/22/2021 06/16/2021 06/15/2021  Glucose 70 - 99 mg/dL 103(H) 128(H) 147(H)  BUN 8 -  23 mg/dL 27(H) 15 17  Creatinine 0.61 - 1.24 mg/dL 0.94 0.66 0.87  Sodium 135 - 145 mmol/L 132(L) 135 132(L)  Potassium 3.5 - 5.1 mmol/L 4.6 4.1 4.4  Chloride 98 - 111 mmol/L 98 101 98  CO2 22 - 32 mmol/L _0 Calcium 8.9 - 10.3 mg/dL 8.6(L) 8.7(L) 8.8(L)  Total Protein 6.5  - 8.1 g/dL 6.6 - -  Total Bilirubin 0.3 - 1.2 mg/dL 0.4 - -  Alkaline Phos 38 - 126 U/L 60 - -  AST 15 - 41 U/L 23 - -  ALT 0 - 44 U/L 74(H) - -    Lab Results  Component Value Date   MPROTEIN Not Observed 06/08/2021   MPROTEIN 0.4 (H) 05/11/2021   MPROTEIN 0.8 (H) 04/13/2021   Lab Results  Component Value Date   KPAFRELGTCHN 28.5 (H) 06/08/2021   KPAFRELGTCHN 29.4 (H) 05/11/2021   KPAFRELGTCHN 31.5 (H) 04/13/2021   LAMBDASER 14.2 06/08/2021   LAMBDASER 14.7 05/11/2021   LAMBDASER 12.8 04/13/2021   KAPLAMBRATIO 2.01 (H) 06/08/2021   KAPLAMBRATIO 2.00 (H) 05/11/2021   KAPLAMBRATIO 2.46 (H) 04/13/2021    RADIOGRAPHIC STUDIES: I have personally reviewed the radiological images as listed and agreed with the findings in the report: lytic lesions in the hip bones bilaterally.  DG Chest 2 View  Result Date: 05/27/2021 CLINICAL DATA:  Shortness of breath.  Multiple myeloma. EXAM: CHEST - 2 VIEW COMPARISON:  February 02, 2021 FINDINGS: The heart size [name] borderline to mildly enlarged. The hila and mediastinum are normal. No pneumothorax. Diffuse interstitial opacities in the lungs are identified, more pronounced in the interval. No focal infiltrate, nodule, or mass. Colonic interposition between the dome of the liver in the right diaphragm. No other abnormalities. IMPRESSION: Findings are most in keeping with mild cardiomegaly and pulmonary edema. An atypical infection could have a similar appearance. Electronically Signed   By: Dorise Bullion III M.D   On: 05/27/2021 16:11   CT Angio Chest PE W/Cm &/Or Wo Cm  Result Date: 06/15/2021 CLINICAL DATA:  History of multiple myeloma with several days of increasing dyspnea upon exertion. EXAM: CT ANGIOGRAPHY CHEST WITH CONTRAST TECHNIQUE: Multidetector CT imaging of the chest was performed using the standard protocol during bolus administration of intravenous contrast. Multiplanar CT image reconstructions and MIPs were obtained to evaluate the  vascular anatomy. CONTRAST:  54m OMNIPAQUE IOHEXOL 350 MG/ML SOLN COMPARISON:  None. FINDINGS: Cardiovascular: There [name] mild to moderate severity calcification of the aortic arch. The subsegmental pulmonary arteries are limited in evaluation secondary to areas of overlying artifact. No evidence of pulmonary embolism. Normal heart size with moderate severity coronary artery calcification. No pericardial effusion. Mediastinum/Nodes: Mild pretracheal, AP window and bilateral hilar lymphadenopathy [name] seen. Thyroid gland, trachea, and esophagus demonstrate no significant findings. Lungs/Pleura: Moderate to marked severity diffusely thickened interstitium [name] noted bilaterally. Mild to moderate severity areas of atelectasis are seen along the periphery of both lungs. There [name] no evidence of a pleural effusion or pneumothorax. Upper Abdomen: No acute abnormality. Musculoskeletal: Degenerative changes are seen throughout the thoracic spine. Review of the MIP images confirms the above findings. IMPRESSION: 1. Diffuse, chronic appearing interstitial lung disease with associated areas of atelectasis. A superimposed component of pulmonary edema cannot be excluded. 2. No evidence of pulmonary embolism. 3. Moderate severity coronary artery disease. 4. Aortic atherosclerosis. Aortic Atherosclerosis (ICD10-I70.0). Electronically Signed   By: TVirgina NorfolkM.D.   On: 06/15/2021 17:16   DG CHEST PORT 1 VIEW  Result Date: 06/18/2021 CLINICAL DATA:  Status post bronchoscopy EXAM: PORTABLE CHEST 1 VIEW COMPARISON:  06/15/2021 FINDINGS: Stable cardiomediastinal contours. No pneumothorax following bronchoscopy. Lung volumes are low and there are diffuse interstitial markings, similar to previous exam. IMPRESSION: 1. No pneumothorax status post bronchoscopy. 2. Stable diffuse interstitial markings. Electronically Signed   By: Kerby Moors M.D.   On: 06/18/2021 14:09   DG Chest Port 1 View  Result Date: 06/15/2021 CLINICAL  DATA:  Shortness of breath. EXAM: PORTABLE CHEST 1 VIEW COMPARISON:  05/25/2021 FINDINGS: Bilateral low lung volumes. Diffuse fine interstitial lung densities are again noted. Heart size [name] grossly stable but accentuated by the low lung volumes. No large areas of consolidation. Negative for a pneumothorax. IMPRESSION: Low lung volumes with persistent diffuse parenchymal lung densities. Lung disease has minimally changed. Electronically Signed   By: Markus Daft M.D.   On: 06/15/2021 15:25   ECHOCARDIOGRAM COMPLETE  Result Date: 06/16/2021    ECHOCARDIOGRAM REPORT   Patient Name:   [place] Date of Exam: 06/16/2021 Medical Rec #:  [id number]       Height:       70.0 in Accession #:    [id number]      Weight:       214.3 lb Date of Birth:  Oct 09, 1956       BSA:          2.149 m Patient Age:    77 years        BP:           157/89 mmHg Patient Gender: M               HR:           67 bpm. Exam Location:  Inpatient Procedure: 2D Echo, 3D Echo, Cardiac Doppler and Color Doppler Indications:    R06.02 SOB  History:        Patient has no prior history of Echocardiogram examinations.                 Signs/Symptoms:Dyspnea and Shortness of Breath; Risk                 Factors:Dyslipidemia. Myeloma.  Sonographer:    Roseanna Rainbow RDCS Referring Phys: [id number] [facility]  Sonographer Comments: Suboptimal apical window. IMPRESSIONS  1. Left ventricular ejection fraction, by estimation, [name] 65 to 70%. The left ventricle has normal function. The left ventricle has no regional wall motion abnormalities. There [name] mild left ventricular hypertrophy. Left ventricular diastolic parameters are indeterminate. Elevated left atrial pressure.  2. Right ventricular systolic function [name] normal. The right ventricular size [name] normal. Tricuspid regurgitation signal [name] inadequate for assessing PA pressure.  3. The pericardial effusion [name] circumferential.  4. The mitral valve [name] normal in structure. No evidence of mitral valve regurgitation.  No evidence of mitral stenosis.  5. The aortic valve has an indeterminant number of cusps. Aortic valve regurgitation [name] not visualized. No aortic stenosis [name] present.  6. Aortic dilatation noted. There [name] mild dilatation of the ascending aorta, measuring 38 mm. FINDINGS  Left Ventricle: Left ventricular ejection fraction, by estimation, [name] 65 to 70%. The left ventricle has normal function. The left ventricle has no regional wall motion abnormalities. The left ventricular internal cavity size was normal in size. There [name]  mild left ventricular hypertrophy. Left ventricular diastolic parameters are indeterminate. Elevated left atrial pressure. Right Ventricle: The right ventricular size [name] normal. No increase in right ventricular wall thickness.  Right ventricular systolic function [name] normal. Tricuspid regurgitation signal [name] inadequate for assessing PA pressure. Left Atrium: Left atrial size was normal in size. Right Atrium: Right atrial size was normal in size. Pericardium: Trivial pericardial effusion [name] present. The pericardial effusion [name] circumferential. Mitral Valve: The mitral valve [name] normal in structure. There [name] mild thickening of the mitral valve leaflet(s). There [name] mild calcification of the mitral valve leaflet(s). Mild mitral annular calcification. No evidence of mitral valve regurgitation. No evidence of mitral valve stenosis. Tricuspid Valve: The tricuspid valve [name] normal in structure. Tricuspid valve regurgitation [name] trivial. No evidence of tricuspid stenosis. Aortic Valve: The aortic valve has an indeterminant number of cusps. Aortic valve regurgitation [name] not visualized. No aortic stenosis [name] present. Aortic valve mean gradient measures 6.3 mmHg. Aortic valve peak gradient measures 11.5 mmHg. Aortic valve area, by VTI measures 2.02 cm. Pulmonic Valve: The pulmonic valve was not well visualized. Pulmonic valve regurgitation [name] not visualized. No evidence of pulmonic stenosis. Aorta: The aortic root  [name] normal in size and structure and aortic dilatation noted. There [name] mild dilatation of the ascending aorta, measuring 38 mm. Venous: The inferior vena cava was not well visualized. IAS/Shunts: The interatrial septum was not well visualized.  LEFT VENTRICLE PLAX 2D LVIDd:         4.90 cm      Diastology LVIDs:         2.70 cm      LV e' medial:    6.20 cm/s LV PW:         1.20 cm      LV E/e' medial:  15.3 LV IVS:        1.10 cm      LV e' lateral:   6.53 cm/s LVOT diam:     1.90 cm      LV E/e' lateral: 14.5 LV SV:         81 LV SV Index:   38 LVOT Area:     2.84 cm  LV Volumes (MOD) LV vol d, MOD A2C: 123.0 ml LV vol d, MOD A4C: 85.4 ml LV vol s, MOD A2C: 30.0 ml LV vol s, MOD A4C: 21.9 ml LV SV MOD A2C:     93.0 ml LV SV MOD A4C:     85.4 ml LV SV MOD BP:      76.9 ml RIGHT VENTRICLE             IVC RV S prime:     15.00 cm/s  IVC diam: 1.50 cm TAPSE (M-mode): 2.1 cm LEFT ATRIUM             Index       RIGHT ATRIUM           Index LA diam:        4.60 cm 2.14 cm/m  RA Area:     14.30 cm LA Vol (A2C):   63.6 ml 29.59 ml/m RA Volume:   33.80 ml  15.73 ml/m LA Vol (A4C):   25.6 ml 11.91 ml/m LA Biplane Vol: 41.6 ml 19.35 ml/m  AORTIC VALVE AV Area (Vmax):    2.26 cm AV Area (Vmean):   2.06 cm AV Area (VTI):     2.02 cm AV Vmax:           169.40 cm/s AV Vmean:          119.341 cm/s AV VTI:            0.403  m AV Peak Grad:      11.5 mmHg AV Mean Grad:      6.3 mmHg LVOT Vmax:         135.00 cm/s LVOT Vmean:        86.900 cm/s LVOT VTI:          0.287 m LVOT/AV VTI ratio: 0.71  AORTA Ao Root diam: 3.00 cm Ao Asc diam:  3.80 cm MITRAL VALVE MV Area (PHT): 3.08 cm    SHUNTS MV Decel Time: 246 msec    Systemic VTI:  0.29 m MV E velocity: 94.70 cm/s  Systemic Diam: 1.90 cm MV A velocity: 80.10 cm/s MV E/A ratio:  1.18 Samuel Dolly MD Electronically signed by Samuel Dolly MD Signature Date/Time: 06/16/2021/10:32:36 AM    Final    DG C-ARM BRONCHOSCOPY  Result Date: 06/18/2021 C-ARM BRONCHOSCOPY:  Fluoroscopy was utilized by the requesting physician.  No radiographic interpretation.     ASSESSMENT & PLAN Samuel [name] 65 y.o. male with medical history significant for IgG Kappa multiple myeloma who presents for a follow up visit.   After review of the labs, review of the records, and discussion with the patient the findings are most consistent with an IgG kappa multiple myeloma.  The patient has 2 lytic lesions within the pelvic bones (more noted in spine on CT scan) as well as 10% plasma cells within the bone marrow biopsy.  This combined with his serological findings confirm the diagnosis of multiple myeloma.  The initial treatment of choice for this patient's multiple myeloma was VRd. This will consist of bortezomib 1.74m/m2 on days 1, 8, 15, dexamethasone 421mon days 1,8, and 15, and revlimid 2540mO daily days 1-14. Cycles will consist of 21 days. We will use this regimen to stabilize the patient's myeloma, then make a referral to a BMT center of his choosing for consideration of a bone marrow transplant once he has been noted to have a good response (VGPR or better). Zometa was started after dental clearance was obtained. This will be continued x 2 years.   Unfortunately had a drug reaction to [name] and was admitted to the hospital from 06/15/2021 until 06/18/2021.  The patient underwent a B AL which clearly showed evidence of eosinophils.  This [name] consistent with a drug reaction most likely caused by the patient's [name].  Discussed the case with pulmonology who recommended that we not rechallenge for attempt other drugs in the same class such as pomalidomide.  Given the [facility] preference would be to continue that.  As such I would recommend we proceed with CyBorD chemotherapy.  Daratumumab-based therapy could have been considered, but [name] often paired with an immunologic.  Additionally I would like to preserve those for additional  lines of therapy if necessary.  Therefore we will proceed with CyBorD chemotherapy have the patient referred to transplant.  R-IPSS score: Stage 2. PFS of 42 months  # IgG Kappa Multiple Myeloma (t11;14, standard risk)  --diagnostic criteria was met with monoclonal plasma cells in the bone marrow and lytic lesions on the bone --recommend proceeding with VRd chemotherapy as noted above --patient [name] young and healthy enough for consideration of BMT, though he [name] borderline with his age. Will consider referral pending response to treatment.  -- Cycle 1 Day 1 started on 03/16/2021 --decreased dexamethasone to 86m67m weekly due to fluid overload. Also started lasix 86mg69m(changed on 06/01/2021) --plan to HOLD revlimid moving forward  after evidence of drug reaction in the lungs. --will plan to start CyBorD chemotherapy on 06/29/2021 --patient has reached a VGPR. [facility]RTC in 1 week to address.   #Drug Reaction in the Lung --confirmed with increased eosinophils on BAL during admission for shortness of breath --currently on a steroid taper --will need to hold revlimid and and avoid the use of pomalidomide moving forward. Do no recommend rechallenge --patient to follow up with pulmonology.   #Supportive Care -- chemotherapy education complete --zometa therapy started on 04/05/2021 (4 mg IV q 3 months), next dose Sept 2022 --ASA 56m PO daily for thromboprophylaxis on revlimid -- zofran [id number] PRN and compazine 1064mO q6H for nausea -- acyclovir 400m55m BID for VCZ prophylaxis -- no pain medication required at this time.   No orders of the defined types were placed in this encounter.   All questions were answered. The patient knows to call the clinic with any problems, questions or concerns.  A total of more than 30 minutes were spent on this encounter with face-to-face time and non-face-to-face time, including preparing to see the patient, ordering tests and/or  medications, counseling the patient and coordination of care as outlined above.   JohnLedell [name] Department of Hematology/Oncology [facility]: [phone]: [phone]: [email or web address]  06/22/2021 5:34 PM

## 2021-06-27 ENCOUNTER — Telehealth: Payer: Self-pay | Admitting: *Deleted

## 2021-06-27 NOTE — Telephone Encounter (Signed)
Received call from pt asking about side effects from new IV chemo-cytoxan. Reviewed side effects with him. He does have antiemetics available should he need any. He also asked about potential bone marrow transplant @ [facility] that I spoke to Dr. [name] about this. He said we can start the referral process. Call made to to Transplant Team @ [facility] to advise them of this new referral. Referral info has been fax'd

## 2021-06-28 LAB — ACID FAST SMEAR (AFB, MYCOBACTERIA): Acid Fast Smear: NEGATIVE

## 2021-06-29 ENCOUNTER — Other Ambulatory Visit: Payer: Self-pay

## 2021-06-29 ENCOUNTER — Inpatient Hospital Stay: Payer: Managed Care, Other (non HMO)

## 2021-06-29 VITALS — BP 156/89 | HR 65 | Temp 98.4°F | Resp 16 | Wt 209.4 lb

## 2021-06-29 DIAGNOSIS — C9000 Multiple myeloma not having achieved remission: Secondary | ICD-10-CM

## 2021-06-29 DIAGNOSIS — Z5112 Encounter for antineoplastic immunotherapy: Secondary | ICD-10-CM | POA: Diagnosis not present

## 2021-06-29 LAB — CMP (CANCER CENTER ONLY)
ALT: 36 U/L (ref 0–44)
AST: 22 U/L (ref 15–41)
Albumin: 3.5 g/dL (ref 3.5–5.0)
Alkaline Phosphatase: 54 U/L (ref 38–126)
Anion gap: 10 (ref 5–15)
BUN: 20 mg/dL (ref 8–23)
CO2: 23 mmol/L (ref 22–32)
Calcium: 8.8 mg/dL — ABNORMAL LOW (ref 8.9–10.3)
Chloride: 97 mmol/L — ABNORMAL LOW (ref 98–111)
Creatinine: 1.04 mg/dL (ref 0.61–1.24)
GFR, Estimated: >60 mL/min (ref >60)
Glucose, Bld: 97 mg/dL (ref 70–99)
Potassium: 4.6 mmol/L (ref 3.5–5.1)
Sodium: 130 mmol/L — ABNORMAL LOW (ref 135–145)
Total Bilirubin: 0.6 mg/dL (ref 0.3–1.2)
Total Protein: 7.1 g/dL (ref 6.5–8.1)

## 2021-06-29 LAB — CBC WITH DIFFERENTIAL (CANCER CENTER ONLY)
Abs Immature Granulocytes: 0.05 10*3/uL (ref 0.00–0.07)
Basophils Absolute: 0 10*3/uL (ref 0.0–0.1)
Basophils Relative: 0 %
Eosinophils Absolute: 0 10*3/uL (ref 0.0–0.5)
Eosinophils Relative: 0 %
HCT: 37.6 % — ABNORMAL LOW (ref 39.0–52.0)
Hemoglobin: 13.1 g/dL (ref 13.0–17.0)
Immature Granulocytes: 0 %
Lymphocytes Relative: 4 %
Lymphs Abs: 0.4 10*3/uL — ABNORMAL LOW (ref 0.7–4.0)
MCH: 31.6 pg (ref 26.0–34.0)
MCHC: 34.8 g/dL (ref 30.0–36.0)
MCV: 90.6 fL (ref 80.0–100.0)
Monocytes Absolute: 0.3 10*3/uL (ref 0.1–1.0)
Monocytes Relative: 3 %
Neutro Abs: 10.9 10*3/uL — ABNORMAL HIGH (ref 1.7–7.7)
Neutrophils Relative %: 93 %
Platelet Count: 201 10*3/uL (ref 150–400)
RBC: 4.15 MIL/uL — ABNORMAL LOW (ref 4.22–5.81)
RDW: 16.4 % — ABNORMAL HIGH (ref 11.5–15.5)
WBC Count: 11.7 10*3/uL — ABNORMAL HIGH (ref 4.0–10.5)
nRBC: 0 % (ref 0.0–0.2)

## 2021-06-29 LAB — LACTATE DEHYDROGENASE: LDH: 374 U/L — ABNORMAL HIGH (ref 98–192)

## 2021-06-29 MED ORDER — ZOLEDRONIC ACID 4 MG/100ML IV SOLN
INTRAVENOUS | Status: AC
  Administered 2021-06-29: 4 mg via INTRAVENOUS
  Filled 2021-06-29: qty 100

## 2021-06-29 MED ORDER — BORTEZOMIB CHEMO SQ INJECTION 3.5 MG (2.5MG/ML)
1.5000 mg/m2 | Freq: Once | INTRAMUSCULAR | Status: AC
  Administered 2021-06-29: 3.25 mg via SUBCUTANEOUS
  Filled 2021-06-29: qty 1.3

## 2021-06-29 MED ORDER — SODIUM CHLORIDE 0.9 % IV SOLN: Freq: Once | INTRAVENOUS | Status: AC

## 2021-06-29 MED ORDER — ZOLEDRONIC ACID 4 MG/100ML IV SOLN: 4.0000 mg | Freq: Once | INTRAVENOUS | Status: AC

## 2021-06-29 MED ORDER — PALONOSETRON HCL INJECTION 0.25 MG/5ML
0.2500 mg | Freq: Once | INTRAVENOUS | Status: AC
  Administered 2021-06-29: 0.25 mg via INTRAVENOUS
  Filled 2021-06-29: qty 5

## 2021-06-29 MED ORDER — SODIUM CHLORIDE 0.9 % IV SOLN
300.0000 mg/m2 | Freq: Once | INTRAVENOUS | Status: AC
  Administered 2021-06-29: 660 mg via INTRAVENOUS
  Filled 2021-06-29: qty 33

## 2021-06-29 NOTE — Progress Notes (Signed)
Per Dr. [name], holding Dexamethasone today d/t pt taking '40mg'$  of Prednisone at home.

## 2021-06-29 NOTE — Patient Instructions (Signed)
[facility]  Discharge Instructions: Thank you for choosing [facility] to provide your oncology and hematology care.   If you have a lab appointment with the [facility], please go directly to the [facility] and check in at the registration area.   Wear comfortable clothing and clothing appropriate for easy access to any Portacath or PICC line.   We strive to give you quality time with your provider. You may need to reschedule your appointment if you arrive late (15 or more minutes).  Arriving late affects you and other patients whose appointments are after yours.  Also, if you miss three or more appointments without notifying the office, you may be dismissed from the clinic at the provider's discretion.      For prescription refill requests, have your pharmacy contact our office and allow 72 hours for refills to be completed.    Today you received the following chemotherapy and/or immunotherapy agents Bortezomib and Cyclophosphamide.      To help prevent nausea and vomiting after your treatment, we encourage you to take your nausea medication as directed.  BELOW ARE SYMPTOMS THAT SHOULD BE REPORTED IMMEDIATELY: *FEVER GREATER THAN 100.4 F (38 C) OR HIGHER *CHILLS OR SWEATING *NAUSEA AND VOMITING THAT IS NOT CONTROLLED WITH YOUR NAUSEA MEDICATION *UNUSUAL SHORTNESS OF BREATH *UNUSUAL BRUISING OR BLEEDING *URINARY PROBLEMS (pain or burning when urinating, or frequent urination) *BOWEL PROBLEMS (unusual diarrhea, constipation, pain near the anus) TENDERNESS IN MOUTH AND THROAT WITH OR WITHOUT PRESENCE OF ULCERS (sore throat, sores in mouth, or a toothache) UNUSUAL RASH, SWELLING OR PAIN  UNUSUAL VAGINAL DISCHARGE OR ITCHING   Items with * indicate a potential emergency and should be followed up as soon as possible or go to the Emergency Department if any problems should occur.  Please show the CHEMOTHERAPY ALERT CARD or IMMUNOTHERAPY  ALERT CARD at check-in to the Emergency Department and triage nurse.  Should you have questions after your visit or need to cancel or reschedule your appointment, please contact [facility]  Dept: [phone]  and follow the prompts.  Office hours are 8:00 a.m. to 4:30 p.m. Monday - Friday. Please note that voicemails left after 4:00 p.m. may not be returned until the following business day.  We are closed weekends and major holidays. You have access to a nurse at all times for urgent questions. Please call the main number to the clinic Dept: [phone] and follow the prompts.   For any non-urgent questions, you may also contact your provider using MyChart. We now offer e-Visits for anyone 63 and older to request care online for non-urgent symptoms. For details visit [email or web address].   Also download the MyChart app! Go to the app store, search "MyChart", open the app, select [facility], and log in with your MyChart username and password.  Due to Covid, a mask is required upon entering the hospital/clinic. If you do not have a mask, one will be given to you upon arrival. For doctor visits, patients may have 1 support person aged 59 or older with them. For treatment visits, patients cannot have anyone with them due to current Covid guidelines and our immunocompromised population.   Cyclophosphamide Injection What is this medication? CYCLOPHOSPHAMIDE (sye kloe FOSS fa mide) is a chemotherapy drug. It slows the growth of cancer cells. This medicine is used to treat many types of cancer like lymphoma, myeloma, leukemia, breast cancer, and ovarian cancer, to name a few. This  medicine may be used for other purposes; ask your health care provider or pharmacist if you have questions. COMMON BRAND NAME(S): Cytoxan, Neosar What should I tell my care team before I take this medication? They need to know if you have any of these conditions: heart disease history of  irregular heartbeat infection kidney disease liver disease low blood counts, like white cells, platelets, or red blood cells on hemodialysis recent or ongoing radiation therapy scarring or thickening of the lungs trouble passing urine an unusual or allergic reaction to cyclophosphamide, other medicines, foods, dyes, or preservatives pregnant or trying to get pregnant breast-feeding How should I use this medication? This drug is usually given as an injection into a vein or muscle or by infusion into a vein. It is administered in a hospital or clinic by a specially trained health care professional. Talk to your pediatrician regarding the use of this medicine in children. Special care may be needed. Overdosage: If you think you have taken too much of this medicine contact a poison control center or emergency room at once. NOTE: This medicine is only for you. Do not share this medicine with others. What if I miss a dose? It is important not to miss your dose. Call your doctor or health care professional if you are unable to keep an appointment. What may interact with this medication? amphotericin B azathioprine certain antivirals for HIV or hepatitis certain medicines for blood pressure, heart disease, irregular heart beat certain medicines that treat or prevent blood clots like warfarin certain other medicines for cancer cyclosporine etanercept indomethacin medicines that relax muscles for surgery medicines to increase blood counts metronidazole This list may not describe all possible interactions. Give your health care provider a list of all the medicines, herbs, non-prescription drugs, or dietary supplements you use. Also tell them if you smoke, drink alcohol, or use illegal drugs. Some items may interact with your medicine. What should I watch for while using this medication? Your condition will be monitored carefully while you are receiving this medicine. You may need blood work  done while you are taking this medicine. Drink water or other fluids as directed. Urinate often, even at night. Some products may contain alcohol. Ask your health care professional if this medicine contains alcohol. Be sure to tell all health care professionals you are taking this medicine. Certain medicines, like metronidazole and disulfiram, can cause an unpleasant reaction when taken with alcohol. The reaction includes flushing, headache, nausea, vomiting, sweating, and increased thirst. The reaction can last from 30 minutes to several hours. Do not become pregnant while taking this medicine or for 1 year after stopping it. Women should inform their health care professional if they wish to become pregnant or think they might be pregnant. Men should not father a child while taking this medicine and for 4 months after stopping it. There is potential for serious side effects to an unborn child. Talk to your health care professional for more information. Do not breast-feed an infant while taking this medicine or for 1 week after stopping it. This medicine has caused ovarian failure in some women. This medicine may make it more difficult to get pregnant. Talk to your health care professional if you are concerned about your fertility. This medicine has caused decreased sperm counts in some men. This may make it more difficult to father a child. Talk to your health care professional if you are concerned about your fertility. Call your health care professional for advice if you get  a fever, chills, or sore throat, or other symptoms of a cold or flu. Do not treat yourself. This medicine decreases your body's ability to fight infections. Try to avoid being around people who are sick. Avoid taking medicines that contain aspirin, acetaminophen, ibuprofen, naproxen, or ketoprofen unless instructed by your health care professional. These medicines may hide a fever. Talk to your health care professional about your risk  of cancer. You may be more at risk for certain types of cancer if you take this medicine. If you are going to need surgery or other procedure, tell your health care professional that you are using this medicine. Be careful brushing or flossing your teeth or using a toothpick because you may get an infection or bleed more easily. If you have any dental work done, tell your dentist you are receiving this medicine. What side effects may I notice from receiving this medication? Side effects that you should report to your doctor or health care professional as soon as possible: allergic reactions like skin rash, itching or hives, swelling of the face, lips, or tongue breathing problems nausea, vomiting signs and symptoms of bleeding such as bloody or black, tarry stools; red or dark brown urine; spitting up blood or brown material that looks like coffee grounds; red spots on the skin; unusual bruising or bleeding from the eyes, gums, or nose signs and symptoms of heart failure like fast, irregular heartbeat, sudden weight gain; swelling of the ankles, feet, hands signs and symptoms of infection like fever; chills; cough; sore throat; pain or trouble passing urine signs and symptoms of kidney injury like trouble passing urine or change in the amount of urine signs and symptoms of liver injury like dark yellow or brown urine; general ill feeling or flu-like symptoms; light-colored stools; loss of appetite; nausea; right upper belly pain; unusually weak or tired; yellowing of the eyes or skin Side effects that usually do not require medical attention (report to your doctor or health care professional if they continue or are bothersome): confusion decreased hearing diarrhea facial flushing hair loss headache loss of appetite missed menstrual periods signs and symptoms of low red blood cells or anemia such as unusually weak or tired; feeling faint or lightheaded; falls skin discoloration This list may  not describe all possible side effects. Call your doctor for medical advice about side effects. You may report side effects to FDA at 1-800-FDA-1088. Where should I keep my medication? This drug is given in a hospital or clinic and will not be stored at home. NOTE: This sheet is a summary. It may not cover all possible information. If you have questions about this medicine, talk to your doctor, pharmacist, or health care provider.  2022 [employer] (2019-07-12 09:53:29)

## 2021-07-02 LAB — KAPPA/LAMBDA LIGHT CHAINS
Kappa free light chain: 22.6 mg/L — ABNORMAL HIGH (ref 3.3–19.4)
Kappa, lambda light chain ratio: 2.02 — ABNORMAL HIGH (ref 0.26–1.65)
Lambda free light chains: 11.2 mg/L (ref 5.7–26.3)

## 2021-07-04 ENCOUNTER — Telehealth: Payer: Self-pay | Admitting: *Deleted

## 2021-07-04 ENCOUNTER — Other Ambulatory Visit: Payer: Self-pay | Admitting: *Deleted

## 2021-07-04 LAB — MULTIPLE MYELOMA PANEL, SERUM
Albumin SerPl Elph-Mcnc: 3.5 g/dL (ref 2.9–4.4)
Albumin/Glob SerPl: 1.3 (ref 0.7–1.7)
Alpha 1: 0.2 g/dL (ref 0.0–0.4)
Alpha2 Glob SerPl Elph-Mcnc: 0.7 g/dL (ref 0.4–1.0)
B-Globulin SerPl Elph-Mcnc: 0.9 g/dL (ref 0.7–1.3)
Gamma Glob SerPl Elph-Mcnc: 1.1 g/dL (ref 0.4–1.8)
Globulin, Total: 2.9 g/dL (ref 2.2–3.9)
IgA: 172 mg/dL (ref 61–437)
IgG (Immunoglobin G), Serum: 1225 mg/dL (ref 603–1613)
IgM (Immunoglobulin M), Srm: 75 mg/dL (ref 20–172)
Total Protein ELP: 6.4 g/dL (ref 6.0–8.5)

## 2021-07-04 NOTE — Telephone Encounter (Signed)
Received call from pt. He states that ever since he got his [name] last Friday, his breathing started to get worse again. He seems ok when just sitting but as soon as he starts moving about, his 02 sats start to drop. Sitting S02 is around 98, moving around they drop to 90 and he feels very short of breath. He does have an appt with Dr. [name] tomorrow, 07/05/21. He wanted to give Dr. [name] a heads up about this.

## 2021-07-05 ENCOUNTER — Inpatient Hospital Stay (HOSPITAL_BASED_OUTPATIENT_CLINIC_OR_DEPARTMENT_OTHER): Payer: Managed Care, Other (non HMO) | Admitting: Hematology and Oncology

## 2021-07-05 ENCOUNTER — Inpatient Hospital Stay: Payer: Managed Care, Other (non HMO)

## 2021-07-05 ENCOUNTER — Other Ambulatory Visit: Payer: Self-pay

## 2021-07-05 VITALS — BP 135/86 | HR 60 | Temp 98.3°F | Resp 18 | Ht 70.0 in | Wt 210.4 lb

## 2021-07-05 DIAGNOSIS — M899 Disorder of bone, unspecified: Secondary | ICD-10-CM | POA: Diagnosis not present

## 2021-07-05 DIAGNOSIS — C9000 Multiple myeloma not having achieved remission: Secondary | ICD-10-CM

## 2021-07-05 DIAGNOSIS — Z5112 Encounter for antineoplastic immunotherapy: Secondary | ICD-10-CM | POA: Diagnosis not present

## 2021-07-05 LAB — CBC WITH DIFFERENTIAL (CANCER CENTER ONLY)
Abs Immature Granulocytes: 0.06 10*3/uL (ref 0.00–0.07)
Basophils Absolute: 0 10*3/uL (ref 0.0–0.1)
Basophils Relative: 0 %
Eosinophils Absolute: 0 10*3/uL (ref 0.0–0.5)
Eosinophils Relative: 0 %
HCT: 38.7 % — ABNORMAL LOW (ref 39.0–52.0)
Hemoglobin: 13.2 g/dL (ref 13.0–17.0)
Immature Granulocytes: 1 %
Lymphocytes Relative: 4 %
Lymphs Abs: 0.4 10*3/uL — ABNORMAL LOW (ref 0.7–4.0)
MCH: 31.7 pg (ref 26.0–34.0)
MCHC: 34.1 g/dL (ref 30.0–36.0)
MCV: 92.8 fL (ref 80.0–100.0)
Monocytes Absolute: 0.2 10*3/uL (ref 0.1–1.0)
Monocytes Relative: 2 %
Neutro Abs: 8.8 10*3/uL — ABNORMAL HIGH (ref 1.7–7.7)
Neutrophils Relative %: 93 %
Platelet Count: 161 10*3/uL (ref 150–400)
RBC: 4.17 MIL/uL — ABNORMAL LOW (ref 4.22–5.81)
RDW: 16.2 % — ABNORMAL HIGH (ref 11.5–15.5)
WBC Count: 9.4 10*3/uL (ref 4.0–10.5)
nRBC: 0 % (ref 0.0–0.2)

## 2021-07-05 LAB — CMP (CANCER CENTER ONLY)
ALT: 23 U/L (ref 0–44)
AST: 16 U/L (ref 15–41)
Albumin: 3.4 g/dL — ABNORMAL LOW (ref 3.5–5.0)
Alkaline Phosphatase: 55 U/L (ref 38–126)
Anion gap: 9 (ref 5–15)
BUN: 18 mg/dL (ref 8–23)
CO2: 23 mmol/L (ref 22–32)
Calcium: 9.1 mg/dL (ref 8.9–10.3)
Chloride: 96 mmol/L — ABNORMAL LOW (ref 98–111)
Creatinine: 0.95 mg/dL (ref 0.61–1.24)
GFR, Estimated: >60 mL/min (ref >60)
Glucose, Bld: 105 mg/dL — ABNORMAL HIGH (ref 70–99)
Potassium: 4.7 mmol/L (ref 3.5–5.1)
Sodium: 128 mmol/L — ABNORMAL LOW (ref 135–145)
Total Bilirubin: 0.5 mg/dL (ref 0.3–1.2)
Total Protein: 7.1 g/dL (ref 6.5–8.1)

## 2021-07-05 LAB — LACTATE DEHYDROGENASE: LDH: 334 U/L — ABNORMAL HIGH (ref 98–192)

## 2021-07-06 ENCOUNTER — Inpatient Hospital Stay: Payer: Managed Care, Other (non HMO)

## 2021-07-09 ENCOUNTER — Encounter: Payer: Self-pay | Admitting: Hematology and Oncology

## 2021-07-09 NOTE — Progress Notes (Signed)
[place] [phone]   [phone]  PROGRESS NOTE  Patient Care Team: Antony Contras, MD as PCP - General (Family Medicine)  Hematological/Oncological History # IgG Kappa Multiple Myeloma 02/02/2021:  Presented to [facility] ED due to right sided flank tenderness with bruising. CT abdomen/pelvis: Multiple small lytic lesions in the thoracolumbar spine and bilateral pelvis -SPEP: IgG 2,082 (H), M Protein 1.8 (H). IFE shows IgG monoclonal protein with kappa light chain specificity.  -LDH 169, CBC normal, CMP normal except for sodium 131 (L), Chloride 96 (L).   02/14/2021: Establish care with Dede Query PA-C 02/22/2021: bone marrow biopsy confirms the diagnosis of Multiple Myeloma with a monoclonal plasma cell population.  03/16/2021: Cycle 1 Day 1 of VRd chemotherapy  04/06/2021: Cycle 2 Day 1 of VRd chemotherapy  04/27/2021: Cycle 3 Day 1 of VRd chemotherapy  05/18/2021: Cycle 4 Day 1 of VRd chemotherapy  06/01/2021: drop dexamethasone to 37m PO weekly and start lasix due to shortness of breath.  06/15/2021: Desaturation to 87% on ambulation. HELD velcade today and sent to ED for evaluation.  06/22/2021: Findings consistent with drug reaction the lungs with eosinophils on BAL.  Given these findings we will definitely hold [name] and plan to avoid pomalidomide 06/29/2021: Intended start of CyBorD chemotherapy  Interval History:  [name] 65y.o. male with medical history significant for IgG Kappa multiple myeloma who presents for a follow up visit. The patient's last visit was on 06/22/2021.  In the interim since last visit he d/c [name] as this was thought to be the culprit for his symptoms. He has received 1 dose of CyBorD with worsening of his respiratory symptoms.   On exam today Samuel [name] is accompanied by his wife. He unfortunately has had a worsening of his symptoms after his last dose of CyBorD.  He notes that on Saturday he tried to do 9 holes of  golf but was worn out throughout the week afterwards.  He notes that his shortness of breath got worse almost immediately after receiving his last [name] shot.  He notes that he had a "bad fall" Monday evening when he began choking after drinking some hot tea while reading a paper.  He notes that he has had some issues of lightheadedness and dizziness and some episodes where he has had to sit down due to shortness of breath.  Denies any fevers, chills, sweats, nausea, vomiting or diarrhea.  A full 10 point ROS is listed below.  MEDICAL HISTORY:  Past Medical History:  Diagnosis Date   Asthma    High cholesterol    under control.    Perennial allergic rhinitis    Sciatic pain, right    Seasonal allergic rhinitis    Thyroid disease     SURGICAL HISTORY: Past Surgical History:  Procedure Laterality Date   BRONCHIAL BIOPSY  06/18/2021   Procedure: BRONCHIAL BIOPSIES;  Surgeon: BCollene Gobble MD;  Location: [facility];  Service: Cardiopulmonary;;   BRONCHIAL WASHINGS  06/18/2021   Procedure: BRONCHIAL WASHINGS;  Surgeon: BCollene Gobble MD;  Location: [facility];  Service: Cardiopulmonary;;   NASAL SINUS SURGERY     NECK SURGERY  1996   VIDEO BRONCHOSCOPY N/A 06/18/2021   Procedure: VIDEO BRONCHOSCOPY WITH FLUORO;  Surgeon: BCollene Gobble MD;  Location: [facility];  Service: Cardiopulmonary;  Laterality: N/A;    SOCIAL HISTORY: Social History   Socioeconomic History   Marital status: Married    Spouse name: Not on file  Number of children: Not on file   Years of education: Not on file   Highest education level: Not on file  Occupational History   Not on file  Tobacco Use   Smoking status: Former    Packs/day: 0.10    Years: 5.00    Pack years: 0.50    Types: Cigarettes    Quit date: 10/21/1982    Years since quitting: 38.7   Smokeless tobacco: Never  Substance and Sexual Activity   Alcohol use: Yes    Comment: 1 drink daily   Drug use: No   Sexual activity: Not  on file  Other Topics Concern   Not on file  Social History Narrative   Not on file   Social Determinants of Health   Financial Resource Strain: Not on file  Food Insecurity: Not on file  Transportation Needs: Not on file  Physical Activity: Not on file  Stress: Not on file  Social Connections: Not on file  Intimate Partner Violence: Not on file    FAMILY HISTORY: Family History  Problem Relation Age of Onset   Allergies Father    Allergies Mother    Hypertension Other    Heart disease Other    Breast cancer Paternal Grandmother     ALLERGIES:  is allergic to clarithromycin and penicillins.  MEDICATIONS:  Current Outpatient Medications  Medication Sig Dispense Refill   acyclovir (ZOVIRAX) 400 MG tablet Take 1 tablet (400 mg total) by mouth 2 (two) times daily. 60 tablet 3   albuterol (VENTOLIN HFA) 108 (90 Base) MCG/ACT inhaler Inhale 2 puffs into the lungs every 6 (six) hours as needed for wheezing or shortness of breath. 8 g 1   allopurinol (ZYLOPRIM) 300 MG tablet Take 300 mg by mouth daily.     ALPRAZolam (XANAX) 1 MG tablet Take 1 mg by mouth 2 (two) times daily as needed for anxiety.     aspirin EC 81 MG tablet Take 81 mg by mouth daily. Swallow whole.     ASSESS FULL RANGE PEAK METER DEVI as directed.     BREO ELLIPTA 200-25 MCG/INH AEPB Inhale 1 puff into the lungs daily.     cholecalciferol (VITAMIN D) 1000 UNITS tablet Take 3,000 Units by mouth daily.     EPINEPHrine (EPI-PEN) 0.3 mg/0.3 mL DEVI Inject 0.3 mg into the muscle once as needed.     ezetimibe-simvastatin (VYTORIN) 10-40 MG per tablet Take 1 tablet by mouth at bedtime.     furosemide (LASIX) 20 MG tablet Take 1 tablet (20 mg total) by mouth as needed for fluid or edema. 14 tablet 1   lenalidomide ([name]) 25 MG capsule TAKE 1 CAPSULE DAILY FOR 14 DAYS ON, 7 DAYS OFF, REPEAT EVERY 21 DAYS [employer] auth # [id number]  Date : 05/29/21 14 capsule 0   levothyroxine (SYNTHROID) 150 MCG tablet Take 150 mcg by  mouth every other day. Alternates with 175 mcg     levothyroxine (SYNTHROID) 175 MCG tablet Take 1 tablet by mouth every other day.     montelukast (SINGULAIR) 10 MG tablet Take 10 mg by mouth at bedtime.     Multiple Vitamin (MULTIVITAMIN) tablet Take 1 tablet by mouth daily.     omeprazole (PRILOSEC) 40 MG capsule Take 1 capsule by mouth daily.     ondansetron (ZOFRAN) 8 MG tablet Take 1 tablet (8 mg total) by mouth every 8 (eight) hours as needed for nausea or vomiting. 30 tablet 0   predniSONE (DELTASONE) 10 MG  tablet Take 5 tablets (50 mg total) by mouth daily with breakfast for 7 days, THEN 4 tablets (40 mg total) daily with breakfast for 7 days, THEN 3 tablets (30 mg total) daily with breakfast for 7 days, THEN 2 tablets (20 mg total) daily with breakfast for 7 days, THEN 1 tablet (10 mg total) daily with breakfast for 7 days, THEN 0.5 tablets (5 mg total) daily with breakfast for 7 days. 108.5 tablet 0   prochlorperazine (COMPAZINE) 10 MG tablet Take 1 tablet (10 mg total) by mouth every 6 (six) hours as needed for nausea or vomiting. 30 tablet 0   sildenafil (REVATIO) 20 MG tablet 2-5 tablets one hour prior to sexual intimacy     tamsulosin (FLOMAX) 0.4 MG CAPS capsule Take 0.4 mg by mouth daily.     triamcinolone cream (KENALOG) 0.1 % Apply topically.     Zinc 50 MG TABS Take by mouth.     No current facility-administered medications for this visit.    REVIEW OF SYSTEMS:   Constitutional: ( - ) fevers, ( - )  chills , ( - ) night sweats Eyes: ( - ) blurriness of vision, ( - ) double vision, ( - ) watery eyes Ears, nose, mouth, throat, and face: ( - ) mucositis, ( - ) sore throat Respiratory: ( - ) cough, ( - ) dyspnea, ( - ) wheezes Cardiovascular: ( - ) palpitation, ( - ) chest discomfort, ( - ) lower extremity swelling Gastrointestinal:  ( - ) nausea, ( - ) heartburn, ( - ) change in bowel habits Skin: ( - ) abnormal skin rashes Lymphatics: ( - ) new lymphadenopathy, ( - ) easy  bruising Neurological: ( - ) numbness, ( - ) tingling, ( - ) new weaknesses Behavioral/Psych: ( - ) mood change, ( - ) new changes  All other systems were reviewed with the patient and are negative.  PHYSICAL EXAMINATION: ECOG PERFORMANCE STATUS: 0 - Asymptomatic  Vitals:   07/05/21 1520  BP: 135/86  Pulse: 60  Resp: 18  Temp: 98.3 F (36.8 C)  SpO2: 98%     Filed Weights   07/05/21 1520  Weight: 210 lb 6.4 oz (95.4 kg)    GENERAL: well appearing middle aged Caucasian male alert, no distress and comfortable SKIN: skin color, texture, turgor are normal, no rashes or significant lesions EYES: conjunctiva are pink and non-injected, sclera clear LUNGS: No overt fine crackles but some coarse lung sounds.  No evidence of fluid overload.  Improved airflow from prior. HEART: regular rate & rhythm and no murmurs and no lower extremity edema Musculoskeletal: no cyanosis of digits and no clubbing  PSYCH: alert & oriented x 3, fluent speech NEURO: no focal motor/sensory deficits  LABORATORY DATA:  I have reviewed the data as listed CBC Latest Ref Rng & Units 07/05/2021 06/29/2021 06/22/2021  WBC 4.0 - 10.5 K/uL 9.4 11.7(H) 15.0(H)  Hemoglobin 13.0 - 17.0 g/dL 13.2 13.1 12.5(L)  Hematocrit 39.0 - 52.0 % 38.7(L) 37.6(L) 35.8(L)  Platelets 150 - 400 K/uL 161 201 293    CMP Latest Ref Rng & Units 07/05/2021 06/29/2021 06/22/2021  Glucose 70 - 99 mg/dL 105(H) 97 103(H)  BUN 8 - 23 mg/dL 18 20 27(H)  Creatinine 0.61 - 1.24 mg/dL 0.95 1.04 0.94  Sodium 135 - 145 mmol/L 128(L) 130(L) 132(L)  Potassium 3.5 - 5.1 mmol/L 4.7 4.6 4.6  Chloride 98 - 111 mmol/L 96(L) 97(L) 98  CO2 22 - 32 mmol/L 23 23  25  Calcium 8.9 - 10.3 mg/dL 9.1 8.8(L) 8.6(L)  Total Protein 6.5 - 8.1 g/dL 7.1 7.1 6.6  Total Bilirubin 0.3 - 1.2 mg/dL 0.5 0.6 0.4  Alkaline Phos 38 - 126 U/L 55 54 60  AST 15 - 41 U/L _0 ALT 0 - 44 U/L 23 36 74(H)    Lab Results  Component Value Date   MPROTEIN Not Observed 06/29/2021    MPROTEIN Not Observed 06/08/2021   MPROTEIN 0.4 (H) 05/11/2021   Lab Results  Component Value Date   KPAFRELGTCHN 22.6 (H) 06/29/2021   KPAFRELGTCHN 28.5 (H) 06/08/2021   KPAFRELGTCHN 29.4 (H) 05/11/2021   LAMBDASER 11.2 06/29/2021   LAMBDASER 14.2 06/08/2021   LAMBDASER 14.7 05/11/2021   KAPLAMBRATIO 2.02 (H) 06/29/2021   KAPLAMBRATIO 2.01 (H) 06/08/2021   KAPLAMBRATIO 2.00 (H) 05/11/2021    RADIOGRAPHIC STUDIES: I have personally reviewed the radiological images as listed and agreed with the findings in the report: lytic lesions in the hip bones bilaterally.  CT Angio Chest PE W/Cm &/Or Wo Cm  Result Date: 06/15/2021 CLINICAL DATA:  History of multiple myeloma with several days of increasing dyspnea upon exertion. EXAM: CT ANGIOGRAPHY CHEST WITH CONTRAST TECHNIQUE: Multidetector CT imaging of the chest was performed using the standard protocol during bolus administration of intravenous contrast. Multiplanar CT image reconstructions and MIPs were obtained to evaluate the vascular anatomy. CONTRAST:  32m OMNIPAQUE IOHEXOL 350 MG/ML SOLN COMPARISON:  None. FINDINGS: Cardiovascular: There is mild to moderate severity calcification of the aortic arch. The subsegmental pulmonary arteries are limited in evaluation secondary to areas of overlying artifact. No evidence of pulmonary embolism. Normal heart size with moderate severity coronary artery calcification. No pericardial effusion. Mediastinum/Nodes: Mild pretracheal, AP window and bilateral hilar lymphadenopathy is seen. Thyroid gland, trachea, and esophagus demonstrate no significant findings. Lungs/Pleura: Moderate to marked severity diffusely thickened interstitium is noted bilaterally. Mild to moderate severity areas of atelectasis are seen along the periphery of both lungs. There is no evidence of a pleural effusion or pneumothorax. Upper Abdomen: No acute abnormality. Musculoskeletal: Degenerative changes are seen throughout the  thoracic spine. Review of the MIP images confirms the above findings. IMPRESSION: 1. Diffuse, chronic appearing interstitial lung disease with associated areas of atelectasis. A superimposed component of pulmonary edema cannot be excluded. 2. No evidence of pulmonary embolism. 3. Moderate severity coronary artery disease. 4. Aortic atherosclerosis. Aortic Atherosclerosis (ICD10-I70.0). Electronically Signed   By: TVirgina NorfolkM.D.   On: 06/15/2021 17:16   DG CHEST PORT 1 VIEW  Result Date: 06/18/2021 CLINICAL DATA:  Status post bronchoscopy EXAM: PORTABLE CHEST 1 VIEW COMPARISON:  06/15/2021 FINDINGS: Stable cardiomediastinal contours. No pneumothorax following bronchoscopy. Lung volumes are low and there are diffuse interstitial markings, similar to previous exam. IMPRESSION: 1. No pneumothorax status post bronchoscopy. 2. Stable diffuse interstitial markings. Electronically Signed   By: TKerby MoorsM.D.   On: 06/18/2021 14:09   DG Chest Port 1 View  Result Date: 06/15/2021 CLINICAL DATA:  Shortness of breath. EXAM: PORTABLE CHEST 1 VIEW COMPARISON:  05/25/2021 FINDINGS: Bilateral low lung volumes. Diffuse fine interstitial lung densities are again noted. Heart size is grossly stable but accentuated by the low lung volumes. No large areas of consolidation. Negative for a pneumothorax. IMPRESSION: Low lung volumes with persistent diffuse parenchymal lung densities. Lung disease has minimally changed. Electronically Signed   By: AMarkus DaftM.D.   On: 06/15/2021 15:25   ECHOCARDIOGRAM COMPLETE  Result Date: 06/16/2021  ECHOCARDIOGRAM REPORT   Patient Name:   Samuel [name] Date of Exam: 06/16/2021 Medical Rec #:  [id number]       Height:       70.0 in Accession #:    [id number]      Weight:       214.3 lb Date of Birth:  15-Jun-1956       BSA:          2.149 m Patient Age:    39 years        BP:           157/89 mmHg Patient Gender: M               HR:           67 bpm. Exam Location:  Inpatient  Procedure: 2D Echo, 3D Echo, Cardiac Doppler and Color Doppler Indications:    R06.02 SOB  History:        Patient has no prior history of Echocardiogram examinations.                 Signs/Symptoms:Dyspnea and Shortness of Breath; Risk                 Factors:Dyslipidemia. Myeloma.  Sonographer:    Roseanna Rainbow RDCS Referring Phys: [id number] [facility]  Sonographer Comments: Suboptimal apical window. IMPRESSIONS  1. Left ventricular ejection fraction, by estimation, is 65 to 70%. The left ventricle has normal function. The left ventricle has no regional wall motion abnormalities. There is mild left ventricular hypertrophy. Left ventricular diastolic parameters are indeterminate. Elevated left atrial pressure.  2. Right ventricular systolic function is normal. The right ventricular size is normal. Tricuspid regurgitation signal is inadequate for assessing PA pressure.  3. The pericardial effusion is circumferential.  4. The mitral valve is normal in structure. No evidence of mitral valve regurgitation. No evidence of mitral stenosis.  5. The aortic valve has an indeterminant number of cusps. Aortic valve regurgitation is not visualized. No aortic stenosis is present.  6. Aortic dilatation noted. There is mild dilatation of the ascending aorta, measuring 38 mm. FINDINGS  Left Ventricle: Left ventricular ejection fraction, by estimation, is 65 to 70%. The left ventricle has normal function. The left ventricle has no regional wall motion abnormalities. The left ventricular internal cavity size was normal in size. There is  mild left ventricular hypertrophy. Left ventricular diastolic parameters are indeterminate. Elevated left atrial pressure. Right Ventricle: The right ventricular size is normal. No increase in right ventricular wall thickness. Right ventricular systolic function is normal. Tricuspid regurgitation signal is inadequate for assessing PA pressure. Left Atrium: Left atrial size was normal in size. Right  Atrium: Right atrial size was normal in size. Pericardium: Trivial pericardial effusion is present. The pericardial effusion is circumferential. Mitral Valve: The mitral valve is normal in structure. There is mild thickening of the mitral valve leaflet(s). There is mild calcification of the mitral valve leaflet(s). Mild mitral annular calcification. No evidence of mitral valve regurgitation. No evidence of mitral valve stenosis. Tricuspid Valve: The tricuspid valve is normal in structure. Tricuspid valve regurgitation is trivial. No evidence of tricuspid stenosis. Aortic Valve: The aortic valve has an indeterminant number of cusps. Aortic valve regurgitation is not visualized. No aortic stenosis is present. Aortic valve mean gradient measures 6.3 mmHg. Aortic valve peak gradient measures 11.5 mmHg. Aortic valve area, by VTI measures 2.02 cm. Pulmonic Valve: The pulmonic valve was not well visualized. Pulmonic valve regurgitation is not  visualized. No evidence of pulmonic stenosis. Aorta: The aortic root is normal in size and structure and aortic dilatation noted. There is mild dilatation of the ascending aorta, measuring 38 mm. Venous: The inferior vena cava was not well visualized. IAS/Shunts: The interatrial septum was not well visualized.  LEFT VENTRICLE PLAX 2D LVIDd:         4.90 cm      Diastology LVIDs:         2.70 cm      LV e' medial:    6.20 cm/s LV PW:         1.20 cm      LV E/e' medial:  15.3 LV IVS:        1.10 cm      LV e' lateral:   6.53 cm/s LVOT diam:     1.90 cm      LV E/e' lateral: 14.5 LV SV:         81 LV SV Index:   38 LVOT Area:     2.84 cm  LV Volumes (MOD) LV vol d, MOD A2C: 123.0 ml LV vol d, MOD A4C: 85.4 ml LV vol s, MOD A2C: 30.0 ml LV vol s, MOD A4C: 21.9 ml LV SV MOD A2C:     93.0 ml LV SV MOD A4C:     85.4 ml LV SV MOD BP:      76.9 ml RIGHT VENTRICLE             IVC RV S prime:     15.00 cm/s  IVC diam: 1.50 cm TAPSE (M-mode): 2.1 cm LEFT ATRIUM             Index       RIGHT  ATRIUM           Index LA diam:        4.60 cm 2.14 cm/m  RA Area:     14.30 cm LA Vol (A2C):   63.6 ml 29.59 ml/m RA Volume:   33.80 ml  15.73 ml/m LA Vol (A4C):   25.6 ml 11.91 ml/m LA Biplane Vol: 41.6 ml 19.35 ml/m  AORTIC VALVE AV Area (Vmax):    2.26 cm AV Area (Vmean):   2.06 cm AV Area (VTI):     2.02 cm AV Vmax:           169.40 cm/s AV Vmean:          119.341 cm/s AV VTI:            0.403 m AV Peak Grad:      11.5 mmHg AV Mean Grad:      6.3 mmHg LVOT Vmax:         135.00 cm/s LVOT Vmean:        86.900 cm/s LVOT VTI:          0.287 m LVOT/AV VTI ratio: 0.71  AORTA Ao Root diam: 3.00 cm Ao Asc diam:  3.80 cm MITRAL VALVE MV Area (PHT): 3.08 cm    SHUNTS MV Decel Time: 246 msec    Systemic VTI:  0.29 m MV E velocity: 94.70 cm/s  Systemic Diam: 1.90 cm MV A velocity: 80.10 cm/s MV E/A ratio:  1.18 Carlyle Dolly MD Electronically signed by Carlyle Dolly MD Signature Date/Time: 06/16/2021/10:32:36 AM    Final    DG C-ARM BRONCHOSCOPY  Result Date: 06/18/2021 C-ARM BRONCHOSCOPY: Fluoroscopy was utilized by the requesting physician.  No radiographic interpretation.     ASSESSMENT &  PLAN Samuel [name] 65 y.o. male with medical history significant for IgG Kappa multiple myeloma who presents for a follow up visit.   After review of the labs, review of the records, and discussion with the patient the findings are most consistent with an IgG kappa multiple myeloma.  The patient has 2 lytic lesions within the pelvic bones (more noted in spine on CT scan) as well as 10% plasma cells within the bone marrow biopsy.  This combined with his serological findings confirm the diagnosis of multiple myeloma.  The initial treatment of choice for this patient's multiple myeloma was VRd. This will consist of bortezomib 1.63m/m2 on days 1, 8, 15, dexamethasone 419mon days 1,8, and 15, and revlimid 25108mO daily days 1-14. Cycles will consist of 21 days. We will use this regimen to stabilize the patient's  myeloma, then make a referral to a BMT center of his choosing for consideration of a bone marrow transplant once he has been noted to have a good response (VGPR or better). Zometa was started after dental clearance was obtained. This will be continued x 2 years.   Unfortunately had a drug reaction to [name] and was admitted to the hospital from 06/15/2021 until 06/18/2021.  The patient underwent a B AL which clearly showed evidence of eosinophils.  This is consistent with a drug reaction most likely caused by the patient's [name].  Discussed the case with pulmonology who recommended that we not rechallenge for attempt other drugs in the same class such as pomalidomide.  Given [facility] preference would be to continue that.  As such I would recommend we proceed with CyBorD chemotherapy.  Daratumumab-based therapy could have been considered, but is often paired with an immunologic.  Additionally I would like to preserve those for additional lines of therapy if necessary.  Therefore we will proceed with CyBorD chemotherapy have the patient referred to transplant.  R-IPSS score: Stage 2. PFS of 42 months  # IgG Kappa Multiple Myeloma (t11;14, standard risk)  --diagnostic criteria was met with monoclonal plasma cells in the bone marrow and lytic lesions on the bone --recommend proceeding with VRd chemotherapy as noted above --patient is young and healthy enough for consideration of BMT, though he is borderline with his age. Will consider referral pending response to treatment.  -- Cycle 1 Day 1 started on 03/16/2021 --decreased dexamethasone to 82m71m weekly due to fluid overload. Also started lasix 82mg30m(changed on 06/01/2021) --plan to HOLD revlimid moving forward after evidence of drug reaction in the lungs. --started CyBorD chemotherapy on 06/29/2021 --patient has reached a VGPR. [facility]  Plan: --will HOLD  chemotherapy at this time, allowing pulmonology and BMT time to weigh in on a new regimen of choice and possible solutions for his respiratory issues.  --patient has reached a VGPR. [facility]  --RTC 2 weeks to readdress.   #Drug Reaction in the Lung --confirmed with increased eosinophils on BAL during admission for shortness of breath --currently on a steroid taper --will need to hold revlimid and and avoid the use of pomalidomide moving forward. Do no recommend rechallenge --patient to follow up with pulmonology.   #Supportive Care -- chemotherapy education complete --zometa therapy started on 04/05/2021 (4 mg IV q 3 months), next dose Sept 2022 --ASA 81mg 29maily for thromboprophylaxis on revlimid -- zofran 8mg q874mRN and compazine 10mg34m  PO q6H for nausea -- acyclovir 461m PO BID for VCZ prophylaxis -- no pain medication required at this time.   No orders of the defined types were placed in this encounter.   All questions were answered. The patient knows to call the clinic with any problems, questions or concerns.  A total of more than 30 minutes were spent on this encounter with face-to-face time and non-face-to-face time, including preparing to see the patient, ordering tests and/or medications, counseling the patient and coordination of care as outlined above.   JLedell Peoples MD Department of Hematology/Oncology [facility] CenterPhone: [phone]: [phone]: [email or web address]  07/09/2021 9:28 AM

## 2021-07-11 ENCOUNTER — Telehealth: Payer: Self-pay | Admitting: *Deleted

## 2021-07-11 NOTE — Telephone Encounter (Signed)
Received call from pt inquiring about his upcoming appts. Advised that his treatment  has been cancelled for this week as he will be starting a new regime next week.  He states he needs to change appt date for next week as he will be out of town next Friday.  Advised that I would discuss with scheduling.  He also states he was seen at [facility] for his bone marrow transplant   consultation.  He states that appt went well and they are movinf forward with this. He also has an appt with pulmonary tomorrow, He states his breathing is better off [name] and off [name].  Dr. [name] aware of the above.

## 2021-07-12 ENCOUNTER — Encounter: Payer: Self-pay | Admitting: Internal Medicine

## 2021-07-12 ENCOUNTER — Ambulatory Visit (INDEPENDENT_AMBULATORY_CARE_PROVIDER_SITE_OTHER): Payer: Managed Care, Other (non HMO)

## 2021-07-12 ENCOUNTER — Other Ambulatory Visit: Payer: Self-pay

## 2021-07-12 ENCOUNTER — Ambulatory Visit (INDEPENDENT_AMBULATORY_CARE_PROVIDER_SITE_OTHER): Payer: Managed Care, Other (non HMO) | Admitting: Internal Medicine

## 2021-07-12 VITALS — BP 128/80 | HR 71 | Temp 98.0°F | Ht 70.0 in | Wt 209.0 lb

## 2021-07-12 DIAGNOSIS — J849 Interstitial pulmonary disease, unspecified: Secondary | ICD-10-CM

## 2021-07-12 DIAGNOSIS — J189 Pneumonia, unspecified organism: Secondary | ICD-10-CM | POA: Diagnosis not present

## 2021-07-12 DIAGNOSIS — J8281 Chronic eosinophilic pneumonia: Secondary | ICD-10-CM

## 2021-07-12 DIAGNOSIS — T50905A Adverse effect of unspecified drugs, medicaments and biological substances, initial encounter: Secondary | ICD-10-CM

## 2021-07-12 LAB — SEDIMENTATION RATE: Sed Rate: 65 mm/hr — ABNORMAL HIGH (ref 0–20)

## 2021-07-12 LAB — CBC WITH DIFFERENTIAL/PLATELET
Basophils Absolute: 0 10*3/uL (ref 0.0–0.1)
Basophils Relative: 0.1 % (ref 0.0–3.0)
Eosinophils Absolute: 0.1 10*3/uL (ref 0.0–0.7)
Eosinophils Relative: 0.7 % (ref 0.0–5.0)
HCT: 38.8 % — ABNORMAL LOW (ref 39.0–52.0)
Hemoglobin: 13.1 g/dL (ref 13.0–17.0)
Lymphocytes Relative: 5.9 % — ABNORMAL LOW (ref 12.0–46.0)
Lymphs Abs: 0.5 10*3/uL — ABNORMAL LOW (ref 0.7–4.0)
MCHC: 33.7 g/dL (ref 30.0–36.0)
MCV: 94 fl (ref 78.0–100.0)
Monocytes Absolute: 0.5 10*3/uL (ref 0.1–1.0)
Monocytes Relative: 5.1 % (ref 3.0–12.0)
Neutro Abs: 8.2 10*3/uL — ABNORMAL HIGH (ref 1.4–7.7)
Neutrophils Relative %: 88.2 % — ABNORMAL HIGH (ref 43.0–77.0)
Platelets: 297 10*3/uL (ref 150.0–400.0)
RBC: 4.12 Mil/uL — ABNORMAL LOW (ref 4.22–5.81)
RDW: 18.1 % — ABNORMAL HIGH (ref 11.5–15.5)
WBC: 9.3 10*3/uL (ref 4.0–10.5)

## 2021-07-12 LAB — HEMOGLOBIN A1C: Hgb A1c MFr Bld: 5.7 % (ref 4.6–6.5)

## 2021-07-12 NOTE — Patient Instructions (Addendum)
ICD-10-CM   1. 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 ILD (interstitial lung disease) ([place])  J84.9     3. 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 G6PD Check ONO on room air at home Change prednisone to 29m daily morning x 2 weeks then 425mdaily x 2 weeks, then 303maily x 2 weeks, then 47m60mily to continue till further notice   Folllowup - 2 weeks with APP or Dr RaamLedora Bottcher weeks with Dr Ra\mElayne Guerin0 min slot

## 2021-07-12 NOTE — Progress Notes (Addendum)
IOV 10/04/2011  65 year old male. Allergies and possible asthma. Hypothyroidism. Anxiety state but no formal diagnosis. Has history of claustrophobia as well Non-smoker. Music therapist. Dad with CAD - s/p CABG at age 64 (now 43y)  Referred by Dr Donneta Romberg. Reports dyspnea. INsidious onset "all my life". Increased after starting allergy shots in June 2012. Says during hikes after he gets going it gets better. Same in gym. Notices more when he is starting out or stationary at rest. HE is not sure what it is.  Feels he needs to take a deep breath on occasion. So, now referred to cardiology and pulmonary. Says ICS Qvar in august/sept 2012 made it worse. Outside chart mentions asthma but patient says not sure if he has asthma. But reports childhood hx of asthma diagnosed by a pediatrician at age 13. Says he had similar symptoms.  Up until 1990 used to 10K but even back then had similar symptoms and would need to warm up before feeling better. Then stopped running due to neck problems. Similar during swimming exercise in 1996-1997. Quit swimming 1999. Episodes associated with wheezing but no cough. Unclear if albuterol helps but on xopenex prn esp before walking. This whole thing feels like he is not getting "enough juice". Never had PFT but recollects normal spirometry at Dr Rush [place]mark office.  He has seen Dr Gwendel Hanson cardiology for above - treadmill and echo and carotid doppler all pending. Current pulmonary modulating drug is Singulair only  Chest x-ray 07/16/2011 is clear per my personal review.  xxxxxxxxxxxxxxxxxxxxxxxxxxxxxxxxxxxxxxxxxxxxxxxxxxxxxxxxxxxxxxxxxx  Inpatient consult 06/16/21 65 y/o male with a history of multiple myeloma diagnosed in 2022, currently undergoing treatment presented to the [facility] emergency department from his hematology oncology appointment with a chief complaint of dyspnea.  The patient smoked cigarettes briefly as a young adult but since then has lived a relatively healthy  lifestyle staying active with exercise and playing golf.  He was diagnosed with multiple myeloma in the spring 2022 when a bruise was discovered while he was receiving a sports massage.  He went for further work-up and was found to have abnormal lab work, this led to a bone marrow biopsy confirming diagnosis of multiple myeloma.  He has been followed by Dr. [name] who has been treating him with VRd chemotherapy since Mar 16, 2021.  He is currently in the middle of his fifth cycle.     Over the last several weeks he has been developing progressive shortness of breath with exertion.  He says this has been going on for about 3 to 4 weeks and has significantly worsened in the last week.  This is associated with a dry, rare cough.  He denies fevers, chills, leg swelling, weight gain, or mucus production.  In the last several days (just prior to admission) [place] and [place] felt significant shortness of breath so he presented to Dr. Libby Maw office for further evaluation.  He was sent to the emergency room for evaluation further because of the severity of his shortness of breath.  In the emergency department he was noted to have hypoxemia to an O2 saturation of 87% and an abnormal chest exam.  Pulmonary and critical care medicine was consulted for further evaluation.  He says that throughout his adult life he has had what he calls "huffing" when he talks while exerting himself or with exertion.  He says he has been followed by an allergist and has been treated for asthma at times over  the years.  He does not regularly use an inhaler and he says this is not something that he routinely needs.  He was seen by my partner Dr. [name] at 1 point in 2013 during which time he had a normal chest x-ray and normal lung function testing.   He had COVID in April 2022.   June 15, 2021 admission August 26 CT chest images independently reviewed: No pulmonary embolism, diffuse interlobular septal  thickening, pleural nodularity noted, areas of groundglass opacification with some patchy distribution particularly in the lung base, no significant pleural effusion, mild reactive appearing lymphadenopathy in the mediastinum Acute respiratory failure with hypoxemia in the setting of diffuse parenchymal lung disease of undetermined etiology: This is somewhat of a complex case given his underlying asthma and the fact that he had COVID in 2022 and in April 2022 a CT scan of his abdomen showed some ill-defined interstitial changes in the periphery of his lungs on lung windows in the bases.  Based on the time course of his illness and reports in the literature I am most concerned about [name] lung toxicity (see citation below), though it is also important to evaluate for another underlying and progressive primary pulmonary ILD or less likely an opportunistic infection (history doesn't seem to support this).  [name] can cause eosinophilic pneumonia which we can assess for with a bronchoscopy, but his imaging characteristics aren't typical for this.  It is possible that the non-specific interstitial changes seen on his lungs in April 2022 were related to his recent COVID infection.  I don't think his baseline asthma explains his symptoms, though he did have a high serum eosinophil count a few weeks prior to admission (unclear significance).  Saglam B, Paulita Fujita M, Ornek S, Keske S, Tabak L, Cakar N, Zeren H, Aytekin S, [place], Ferhanoglu B. Bortezomib induced pulmonary toxicity: a case report and review of the literature. Am J Blood Res. 2020 Dec 15;10(6):407-415. PMID: [id number]; PMCID: [id number].  06/18/21 -   Results for Samuel [name], Samuel [name]" (MRN [id number]) as of 07/12/2021 09:13  Ref. Range 06/18/2021 12:57  Monocyte-Macrophage-Serous Fluid Latest Ref Range: 50 - 90 % 5 (L)  Other Cells, Fluid Latest Units: % CORRELATE WITH CYTOLOGY.  Color, Fluid Latest Ref Range: YELLOW  PINK (A)  Total  Nucleated Cell Count, Fluid Latest Ref Range: 0 - 1,000 cu mm 103  Fluid Type-FCT Unknown BRONCHIAL ALVEOLAR LAVAGE  Lymphs, Fluid Latest Units: % 18  Eos, Fluid Latest Units: % 51  Appearance, Fluid Latest Ref Range: CLEAR  HAZY (A)  Neutrophil Count, Fluid Latest Ref Range: 0 - 25 % 26 (H)  FINAL MICROSCOPIC DIAGNOSIS:  - No malignant cells identified  - Benign bronchial cells and pulmonary macrophages   Titrate off solumedrol and start prednisone 50 mg daily. Decrease by 10 mg weekly    OV 07/12/2021  Subjective:  Patient ID: Samuel [name], male , DOB: 05/30/1956 , age 18 y.o. , MRN: [id number] , ADDRESS: [place] [id number] PCP Antony Contras, MD Patient Care Team: Antony Contras, MD as PCP - General (Family Medicine)  This Provider for this visit: Treatment Team:  Attending Provider: Brand Males, MD    07/12/2021 -   Chief Complaint  Patient presents with   Hospitalization Follow-up    Pt states he was doing better after being out of the hospital after being placed on steroids. States about a week after being out, states he started having more problems with SOB  and to today, he has had problems with SOB with exertion.   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 -presents for follow-up from the hospital.  I originally met him 10 years ago and then at that point in time he had dyspnea.  He tells me that after that he was exercising and living a good life.  Then in April 2022 got diagnosed incidentally with myeloma multiple.  Says he was caught early.  Then in May 2022 started on [name] and [name].  He says he was doing well with this up until mid July.  Up until then he was walking 1-1/2 2 miles per day 5 days a week.  Then in late July 2022 started noticing shortness of breath.  He met Dr. [name] his oncologist on May 25, 2021.  By June 01, 2021 he had severe pulmonary  infiltrates.  He says a part of then he was getting his [name] once a week along with some steroids with each dose.  At this point in time the steroid dose was cut down but he progressed and started getting worse.  Then on June 15, 2021 he was in significant respiratory distress and admitted to the hospital.  I reviewed the hospital records.  He was there for 3 days.  He underwent bronchoscopy with BAL there is also 51% eosinophilia.  Cultures are negative malignant cells are negative.  He was seen by my pulmonary colleagues.  Discussion was held with the [name] other [name] was the etiology.  Given the use and failure it looks like his [name] has been held.  He was discharged on 50 mg prednisone with advised to taper it by 10 mg/week.  Currently is on 20 mg prednisone.  He started playing golf again and feeling really good through Labor Day weekend..  Then on June 29, 2021 his [name] was not given.  Up until this point all chemo was held.  He was given Cytoxan, Zomenta and [name].  The very next in June 30, 2021 he started feeling worse.  He feels [name] is the etiology for this.  On July 06, 2021 he communicated this with Dr. [name] and all chemo has been held.  He is being referred to the bone marrow transplant program at [facility] for further evaluation.  At this point in time he says he is much better than when he was in the hospital but definitely not as good as he was prior to getting a rechallenge with [name] on June 29, 2021.  But he notices that he slowly improving.  Walking desaturation test shows that his pulse ox is holding up although he does have a tendency to desaturate.   Did extensive review of the literature.  According to up-to-date [name] is a known etiology for pulm eosinophilia but he got worse after getting rechallenged with [name].  Review of the literature shows that [name] can cause drug-induced pneumonitis and a small fraction of patients.  The  literature is  NO INFOR  as to whether these patients have eosinophilia or not but definitely they seem to be steroid responsive.  This literature is attached below.  In the timeframe also fits in with [name].  He is also noticed to be on allopurinol which rarely can cause hypersensitivity syndrome or dermatitis, hepatitis and eosinophilia - ADDRESS Syndrome but he did not have thse features of rash etc     Nevertheless significant eosinophilia in the BAL  51% suggest drug-induced pneumonitis.  He currently has significant symptoms of shortness of breath.  He does not have oxygen with him at home.  Simple office walk 185 feet x  3 laps goal with forehead probe 07/12/2021    O2 used ra   Number laps completed 3   Comments about pace slow   Resting Pulse Ox/HR 100% and 69/min   Final Pulse Ox/HR 92% and 81/min   Desaturated </= 88% no   Desaturated <= 3% points Yes 8   Got Tachycardic >/= 90/min no   Symptoms at end of test No complaints   Miscellaneous comments x        Causes of Pulmonary Eosinophilia: from UPTODATE A. Nonsteroidal antiinflammatory drugs (NSAIDs) B. Nitrofurantoin  C. Minocycline D. Sulfonamides/ Sulfamethoxazole E. Ampicillin F. Daptomycin G.Vancomycin H. Dapsone I. Beta-lactam antibiotics J. Nevirapine K. Telaprevir L. Sulfasalazine M. Methotrexate N. Mesalamine O. Amiodarone P. Bleomycin Q. ACE Inhibitor R. Beta blocker S. Hydrochlorothiazides T. L- Tryptophan U. Allopurinol V. Carbamazepine W. Lamotrigine X. Phenytoin Y. Phenindione Z. Fluindione AA. Olanzapine AB. Oxcarbazepine AC. Strontium Ranelate AD. Lenalidomide AE. Radiographic contrast media  Saglam B, Kalyon H, Ozbalak M, Ornek S, Keske S, Tabak L, Cakar N, Zeren H, Aytekin S, [place], [place] B. Bortezomib induced pulmonary toxicity: a case report and review of the literature. Am J Blood Res. 2020 Dec 15;10(6):407-415. PMID: [id number]; PMCID: [id number]. - Bortezomib  related pulmonary toxicities are rarely reported. Although the incidence of Bortezomib induced lung injury (BLI) is unknown, in a large registry study of 1010 MM patients, 45 patients were reported to have BLI by their physician. However, the causality could only be constituted in 26 patients (2.6%), with 5 of them resulting in death despite steroid treatment.In the case reports, the average number of RVD cycles until the toxicity was presented was 6.9, and the period between the development of pulmonary toxicity and the first dose of Bortezomib was 31.1 days -No mention of eosinophilia  Bortezomib therapy-related lung disease in [place] patients with multiple myeloma: Incidence, mortality and clinical characterization Dyann Ruddle [facility] Miyao,2 [place] Masahiko Kusumoto,5 [place] Fumikazu JJOAC,1 [place] Sugiyama,8 [place] Hatake,9 [place] Pao Fukuda,10 and [place] patients registered, 37 (4.5%) developed BILD, 5 (0.50%) of whom had fatal cases. The median time to BILD onset from the first bortezomib dose was 14.5 days, and most of the patients responded well to corticosteroid therapy. A retrospective review by the Lung Injury Medical Expert Panel revealed that the types with capillary leak syndrome and hypoxia without infiltrative shadows were uniquely and frequently observed in patients with BIL - no mention of eosinophilia  CellularOperator.fi =- allopurinol complex multisystem Hypersensitivityey   has a past medical history of Asthma, High cholesterol, Perennial allergic rhinitis, Sciatic pain, right, Seasonal allergic rhinitis, and Thyroid disease.   reports that he quit smoking about 38 years ago. His smoking use included cigarettes. He has a 0.30 pack-year smoking history. He has never used smokeless tobacco.  Past Surgical History:  Procedure Laterality Date    BRONCHIAL BIOPSY  06/18/2021   Procedure: BRONCHIAL BIOPSIES;  Surgeon: Collene Gobble, MD;  Location: [facility];  Service: Cardiopulmonary;;   BRONCHIAL WASHINGS  06/18/2021   Procedure: BRONCHIAL WASHINGS;  Surgeon: Collene Gobble, MD;  Location: [facility];  Service: Cardiopulmonary;;   [place] N/A 06/18/2021  Procedure: VIDEO BRONCHOSCOPY WITH FLUORO;  Surgeon: Collene Gobble, MD;  Location: [facility];  Service: Cardiopulmonary;  Laterality: N/A;    Allergies  Allergen Reactions   Clarithromycin    Penicillins     Immunization History  Administered Date(s) Administered   Influenza Split 10/27/2017   Influenza, High Dose Seasonal PF 12/01/2017, 11/29/2019, 12/19/2020   Influenza, Quadrivalent, Recombinant, Inj, Pf 07/28/2019, 08/11/2020   Influenza-Unspecified 11/26/2011, 07/21/2017   PFIZER(Purple Top)SARS-COV-2 Vaccination 12/27/2019, 01/24/2020, 09/21/2020   Tdap 08/20/2005, 09/07/2015   Zoster, Live 11/06/2017, 05/07/2018    Family History  Problem Relation Age of Onset   Allergies Father    Allergies Mother    Hypertension Other    Heart disease Other    Breast cancer Paternal Grandmother      Current Outpatient Medications:    acyclovir (ZOVIRAX) 400 MG tablet, Take 1 tablet (400 mg total) by mouth 2 (two) times daily., Disp: 60 tablet, Rfl: 3   albuterol (VENTOLIN HFA) 108 (90 Base) MCG/ACT inhaler, Inhale 2 puffs into the lungs every 6 (six) hours as needed for wheezing or shortness of breath., Disp: 8 g, Rfl: 1   allopurinol (ZYLOPRIM) 300 MG tablet, Take 300 mg by mouth daily., Disp: , Rfl:    ALPRAZolam (XANAX) 1 MG tablet, Take 1 mg by mouth 2 (two) times daily as needed for anxiety., Disp: , Rfl:    aspirin EC 81 MG tablet, Take 81 mg by mouth daily. Swallow whole., Disp: , Rfl:    ASSESS FULL RANGE PEAK METER DEVI, as directed., Disp: , Rfl:    cholecalciferol (VITAMIN D) 1000 UNITS tablet,  Take 3,000 Units by mouth daily., Disp: , Rfl:    EPINEPHrine (EPI-PEN) 0.3 mg/0.3 mL DEVI, Inject 0.3 mg into the muscle once as needed., Disp: , Rfl:    ezetimibe-simvastatin (VYTORIN) 10-40 MG per tablet, Take 1 tablet by mouth at bedtime., Disp: , Rfl:    furosemide (LASIX) 20 MG tablet, Take 1 tablet (20 mg total) by mouth as needed for fluid or edema., Disp: 14 tablet, Rfl: 1   levothyroxine (SYNTHROID) 150 MCG tablet, Take 150 mcg by mouth every other day. Alternates with 175 mcg, Disp: , Rfl:    levothyroxine (SYNTHROID) 175 MCG tablet, Take 1 tablet by mouth every other day., Disp: , Rfl:    montelukast (SINGULAIR) 10 MG tablet, Take 10 mg by mouth at bedtime., Disp: , Rfl:    Multiple Vitamin (MULTIVITAMIN) tablet, Take 1 tablet by mouth daily., Disp: , Rfl:    omeprazole (PRILOSEC) 40 MG capsule, Take 1 capsule by mouth daily., Disp: , Rfl:    predniSONE (DELTASONE) 10 MG tablet, Take 5 tablets (50 mg total) by mouth daily with breakfast for 7 days, THEN 4 tablets (40 mg total) daily with breakfast for 7 days, THEN 3 tablets (30 mg total) daily with breakfast for 7 days, THEN 2 tablets (20 mg total) daily with breakfast for 7 days, THEN 1 tablet (10 mg total) daily with breakfast for 7 days, THEN 0.5 tablets (5 mg total) daily with breakfast for 7 days., Disp: 108.5 tablet, Rfl: 0   sildenafil (REVATIO) 20 MG tablet, 2-5 tablets one hour prior to sexual intimacy, Disp: , Rfl:    tamsulosin (FLOMAX) 0.4 MG CAPS capsule, Take 0.4 mg by mouth daily., Disp: , Rfl:    triamcinolone cream (KENALOG) 0.1 %, Apply topically., Disp: , Rfl:    BREO ELLIPTA 200-25 MCG/INH AEPB, Inhale 1 puff into the lungs daily. (Patient  not taking: Reported on 07/12/2021), Disp: , Rfl:    lenalidomide ([name]) 25 MG capsule, TAKE 1 CAPSULE DAILY FOR 14 DAYS ON, 7 DAYS OFF, REPEAT EVERY 21 DAYS [employer] auth # [id number]  Date : 05/29/21 (Patient not taking: Reported on 07/12/2021), Disp: 14 capsule, Rfl: 0   ondansetron  (ZOFRAN) 8 MG tablet, Take 1 tablet (8 mg total) by mouth every 8 (eight) hours as needed for nausea or vomiting. (Patient not taking: Reported on 07/12/2021), Disp: 30 tablet, Rfl: 0   prochlorperazine (COMPAZINE) 10 MG tablet, Take 1 tablet (10 mg total) by mouth every 6 (six) hours as needed for nausea or vomiting. (Patient not taking: Reported on 07/12/2021), Disp: 30 tablet, Rfl: 0      Objective:   Vitals:   07/12/21 0901  BP: 128/80  Pulse: 71  Temp: 98 F (36.7 C)  TempSrc: Oral  SpO2: 98%  Weight: 209 lb (94.8 kg)  Height: _0  (1.778 m)    Estimated body mass index is 29.99 kg/m as calculated from the following:   Height as of this encounter: _1  (1.778 m).   Weight as of this encounter: 209 lb (94.8 kg).  _2 @  [employer]   07/12/21 0901  Weight: 209 lb (94.8 kg)     Physical Exam   General: No distress. Looks well Neuro: Alert and Oriented x 3. GCS 15. Speech normal Psych: Pleasant Resp:  Barrel Chest - no.  Wheeze - no, Crackles - mayb, No overt respiratory distress CVS: Normal heart sounds. Murmurs - no Ext: Stigmata of Connective Tissue Disease - no HEENT: Normal upper airway. PEERL +. No post nasal drip   LABS    PULMONARY No results for input(s): PHART, PCO2ART, PO2ART, HCO3, TCO2, O2SAT in the last 168 hours.  Invalid input(s): PCO2, PO2  CBC Recent Labs  Lab 07/12/21 0954  HGB 13.1  HCT 38.8*  WBC 9.3  PLT 297.0    COAGULATION No results for input(s): INR in the last 168 hours.  CARDIAC  No results for input(s): TROPONINI in the last 168 hours. No results for input(s): PROBNP in the last 168 hours.   CHEMISTRY No results for input(s): NA, K, CL, CO2, GLUCOSE, BUN, CREATININE, CALCIUM, MG, PHOS in the last 168 hours. Estimated Creatinine Clearance: 90.8 mL/min (by C-G formula based on SCr of 0.95 mg/dL).   LIVER No results for input(s): AST, ALT, ALKPHOS, BILITOT, PROT, ALBUMIN, INR in the last 168  hours.   INFECTIOUS No results for input(s): LATICACIDVEN, PROCALCITON in the last 168 hours.   ENDOCRINE CBG (last 3)  No results for input(s): GLUCAP in the last 72 hours.       IMAGING x48h  - image(s) personally visualized  -   highlighted in bold DG Chest 2 View  Result Date: 07/12/2021 CLINICAL DATA:  65 year old male with history of drug induced pneumonitis. EXAM: CHEST - 2 VIEW COMPARISON:  Chest x-ray 06/18/2021. FINDINGS: Lung volumes are low. Diffuse interstitial prominence throughout the lungs bilaterally with no discernible craniocaudal gradient. No pleural effusions. No pneumothorax. No cephalization of the pulmonary vasculature. Heart size is normal. Upper mediastinal contours are within normal limits. IMPRESSION: 1. The appearance the chest suggests interstitial lung disease, as above, and could be better characterized with high-resolution chest CT if clinically appropriate. Electronically Signed   By: Vinnie Langton M.D.   On: 07/12/2021 16:16    Results for Samuel [name], Samuel [name]" (MRN [id number]) as of 07/12/2021 17:37  Ref. 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 ILD (interstitial lung disease)  ([place])  J84.9     3. 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  According to up-to-date [name] is a known etiology for pulm eosinophilia but he got worse after getting rechallenged with [name].  Review of the literature shows that [name] can cause drug-induced pneumonitis and a small fraction of patients.  The literature is scant as to whether these patients have eosinophilia or not but definitely they seem to be steroid responsive.  This literature is attached below.  In the timeframe also fits in with [name].  Nevertheless significant eosinophilia in the BAL 51% suggest drug-induced pneumonitis  I worry the [name] has made him worse.  At this point in time we will redo his prednisone taper with.  We will do it over a slow period of time with a taper.  We will also check his vitamin D and hemoglobin A1c and G6PD.  In case we need to cover him for prednisone side effects including adding Bactrim.  In order to help with symptoms we will check overnight oxygen study  Is further chemo regimen and treatment regimen for myeloma is going to be complicated.  I suspect that his lungs would need to heal up to a certain point.  I will touch base with his oncologist  He and his wife are appreciative of the care    Plan:     Patient Instructions     ICD-10-CM   1. 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 ILD (interstitial lung disease) ([place])  J84.9     3. 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 G6PD Check Walk test on room air 07/12/2021  - if abnormal consider portable oe Check ONO on room air at home Change prednisone to 61m daily morning x 2 weeks then 418mdaily x 2 weeks, then 3022maily x 2 weeks, then 58m79mily to continue   [place]ol[place]up - 2 weeks with APP or Dr RaamLedora Bottcher weeks with Dr Ra\mElayne Guerin0 min slot  ( Level 05  visit: Estb 40-54 min   in  visit type: on-site physical face to visit  in total care time and counseling or/and coordination of care by this undersigned MD - Dr MuraBrand Malesis includes one or more of the following on this same  day 07/12/2021: pre-charting, chart review, note writing, documentation discussion of test results, diagnostic or treatment recommendations, prognosis, risks and benefits of management options, instructions, education, compliance or risk-factor reduction. It excludes time spent by the [place] or office staff in the care of the patient. Actual time 60 min)  PS: will let Dr Edward Qualia know. - ? Stop allopurinol   SIGNATURE    Dr. Brand Males, M.D., F.C.C.P,  Pulmonary and Critical Care Medicine Staff Physician, [place] Director - Interstitial Lung Disease  Program  [facility] [place], [place], [address]  Pager: [phone], If no answer or between  15:00h - 7:00h: call [phone] Telephone: [phone]  5:37 PM 07/12/2021

## 2021-07-13 ENCOUNTER — Other Ambulatory Visit: Payer: Managed Care, Other (non HMO)

## 2021-07-13 ENCOUNTER — Telehealth: Payer: Self-pay | Admitting: Internal Medicine

## 2021-07-13 ENCOUNTER — Other Ambulatory Visit: Payer: Self-pay | Admitting: *Deleted

## 2021-07-13 ENCOUNTER — Ambulatory Visit: Payer: Managed Care, Other (non HMO)

## 2021-07-13 MED ORDER — LENALIDOMIDE 10 MG PO CAPS: 10.0000 mg | ORAL_CAPSULE | Freq: Every day | ORAL | 0 refills | Status: DC

## 2021-07-13 MED ORDER — PREDNISONE 10 MG PO TABS: ORAL_TABLET | ORAL | 0 refills | Status: DC

## 2021-07-13 NOTE — Telephone Encounter (Signed)
Called and spoke with patient. He is aware that I reviewed his AVS and saw where he was supposed to have prednisone sent to the pharmacy. I will go ahead and send this in for him.   Nothing further needed at time of call.

## 2021-07-13 NOTE — Telephone Encounter (Signed)
   EMil  Pls call patient and ask him how long he has been taking allopurinol and if he has had any skin rash with that.  And if he can have a communication with the doctor prescribing allopurinol if he can come off that?    There are some rare reports of pulmonary eosinophilia with that but these patients also have drug rash and other features with this.  I do not think what happened to him was related to allopurinol but was just double checking  Thanks  MR

## 2021-07-16 NOTE — Telephone Encounter (Signed)
Called and spoke with pt about info stated by MR. Pt said that he has been on allopurinol for at least 4 years if not longer. Stated prior to taking allopurinol, he was on Uloric to help treat gout.  Pt said that he has not had any skin rashes while being on the allopuronil.

## 2021-07-17 LAB — VITAMIN D 1,25 DIHYDROXY
Vitamin D 1, 25 (OH)2 Total: 51 pg/mL (ref 18–72)
Vitamin D2 1, 25 (OH)2: <8 pg/mL
Vitamin D3 1, 25 (OH)2: 51 pg/mL

## 2021-07-17 LAB — GLUCOSE 6 PHOSPHATE DEHYDROGENASE: G-6PDH: 14.8 U/g Hgb (ref 7.0–20.5)

## 2021-07-17 LAB — IGE: IgE (Immunoglobulin E), Serum: 22 kU/L (ref <=114)

## 2021-07-18 ENCOUNTER — Inpatient Hospital Stay: Payer: Managed Care, Other (non HMO)

## 2021-07-18 ENCOUNTER — Inpatient Hospital Stay (HOSPITAL_BASED_OUTPATIENT_CLINIC_OR_DEPARTMENT_OTHER): Payer: Managed Care, Other (non HMO) | Admitting: Hematology and Oncology

## 2021-07-18 ENCOUNTER — Other Ambulatory Visit: Payer: Self-pay

## 2021-07-18 VITALS — BP 165/98 | HR 63 | Temp 98.0°F | Resp 17 | Ht 70.0 in | Wt 211.4 lb

## 2021-07-18 DIAGNOSIS — C9000 Multiple myeloma not having achieved remission: Secondary | ICD-10-CM

## 2021-07-18 DIAGNOSIS — R0602 Shortness of breath: Secondary | ICD-10-CM

## 2021-07-18 DIAGNOSIS — Z5112 Encounter for antineoplastic immunotherapy: Secondary | ICD-10-CM | POA: Diagnosis not present

## 2021-07-18 DIAGNOSIS — M899 Disorder of bone, unspecified: Secondary | ICD-10-CM | POA: Diagnosis not present

## 2021-07-18 LAB — CMP (CANCER CENTER ONLY)
ALT: 28 U/L (ref 0–44)
AST: 21 U/L (ref 15–41)
Albumin: 3.5 g/dL (ref 3.5–5.0)
Alkaline Phosphatase: 68 U/L (ref 38–126)
Anion gap: 10 (ref 5–15)
BUN: 25 mg/dL — ABNORMAL HIGH (ref 8–23)
CO2: 24 mmol/L (ref 22–32)
Calcium: 8.5 mg/dL — ABNORMAL LOW (ref 8.9–10.3)
Chloride: 95 mmol/L — ABNORMAL LOW (ref 98–111)
Creatinine: 0.85 mg/dL (ref 0.61–1.24)
GFR, Estimated: >60 mL/min (ref >60)
Glucose, Bld: 79 mg/dL (ref 70–99)
Potassium: 4.1 mmol/L (ref 3.5–5.1)
Sodium: 129 mmol/L — ABNORMAL LOW (ref 135–145)
Total Bilirubin: 0.5 mg/dL (ref 0.3–1.2)
Total Protein: 6.9 g/dL (ref 6.5–8.1)

## 2021-07-18 LAB — CBC WITH DIFFERENTIAL (CANCER CENTER ONLY)
Abs Immature Granulocytes: 0.08 10*3/uL — ABNORMAL HIGH (ref 0.00–0.07)
Basophils Absolute: 0 10*3/uL (ref 0.0–0.1)
Basophils Relative: 0 %
Eosinophils Absolute: 0 10*3/uL (ref 0.0–0.5)
Eosinophils Relative: 0 %
HCT: 38.2 % — ABNORMAL LOW (ref 39.0–52.0)
Hemoglobin: 13.2 g/dL (ref 13.0–17.0)
Immature Granulocytes: 1 %
Lymphocytes Relative: 18 %
Lymphs Abs: 1.8 10*3/uL (ref 0.7–4.0)
MCH: 32 pg (ref 26.0–34.0)
MCHC: 34.6 g/dL (ref 30.0–36.0)
MCV: 92.5 fL (ref 80.0–100.0)
Monocytes Absolute: 0.7 10*3/uL (ref 0.1–1.0)
Monocytes Relative: 7 %
Neutro Abs: 7.2 10*3/uL (ref 1.7–7.7)
Neutrophils Relative %: 74 %
Platelet Count: 271 10*3/uL (ref 150–400)
RBC: 4.13 MIL/uL — ABNORMAL LOW (ref 4.22–5.81)
RDW: 16.8 % — ABNORMAL HIGH (ref 11.5–15.5)
WBC Count: 9.8 10*3/uL (ref 4.0–10.5)
nRBC: 0 % (ref 0.0–0.2)

## 2021-07-18 LAB — FUNGUS CULTURE WITH STAIN

## 2021-07-18 LAB — LACTATE DEHYDROGENASE: LDH: 386 U/L — ABNORMAL HIGH (ref 98–192)

## 2021-07-18 LAB — FUNGAL ORGANISM REFLEX

## 2021-07-18 LAB — FUNGUS CULTURE RESULT

## 2021-07-18 MED ORDER — FUROSEMIDE 20 MG PO TABS: 20.0000 mg | ORAL_TABLET | ORAL | 1 refills | Status: DC | PRN

## 2021-07-18 NOTE — Telephone Encounter (Signed)
Let patient know that allopurinol is a cause of pulmonary eosinophilia but usually occurs in the setting of rash.  We do not think this was the cause of his pulmonary problem.  However because it is listed, please ask him if he can talk to his primary care physician and if his gout is well controlled if he can stop this.  Otherwise it is okay to continue

## 2021-07-18 NOTE — Telephone Encounter (Signed)
Spoke with the pt and notified of response per Dr [name]. He verbalized understanding. Nothing further needed.

## 2021-07-19 ENCOUNTER — Telehealth: Payer: Self-pay | Admitting: Internal Medicine

## 2021-07-19 ENCOUNTER — Telehealth: Payer: Self-pay | Admitting: *Deleted

## 2021-07-19 ENCOUNTER — Other Ambulatory Visit: Payer: Self-pay | Admitting: *Deleted

## 2021-07-19 MED ORDER — LENALIDOMIDE 10 MG PO CAPS: 10.0000 mg | ORAL_CAPSULE | Freq: Every day | ORAL | 0 refills | Status: DC

## 2021-07-19 NOTE — Telephone Encounter (Signed)
[name] patient regarding his next shipment of [name] 10 mg. Spoke with him and advised that [facility] is doing its last pharmacy review and they will be contacting him about the timing of the shipment.Advised that he should hear from them in the next few days.   Pt voiced understanding.  He states his breathing is no better. He states he gets very short of breath just walking a few feet. Advised to call back to Dr. [name] in Pulmonary as they are managing his respiratory status.   Samuel [name] voice understanding and will be calling them as soon as he hangs of with this RN.

## 2021-07-19 NOTE — Telephone Encounter (Signed)
Received call from [place] for new auth # for [name]. Call made to [employer] and new auth # obtained. Returned call to [facility] and provided them with new auth # Call made to pt to advise he needs to do patient survey and provided phone # for this.  [phone], option #2.  Pt advised he has an appt with pulmonary provider tomorrow at 2pm. He states he will call me after to advise me of the results of that appt.

## 2021-07-19 NOTE — Telephone Encounter (Signed)
Called and spoke with patient who is calling because he is having shortness of breath that has been going on for awhile but started to get a little worse today. When I asked patient about a cough he said yes but continuously cleared his throat while on the phone and said that's what he keeps doing so not really a cough. Patient stated he would like to see someone if possible since we are going into the weekend. Patient has been scheduled with Aaron Edelman for tomorrow. Nothing further needed at this time.

## 2021-07-20 ENCOUNTER — Encounter: Payer: Self-pay | Admitting: Pulmonary Disease

## 2021-07-20 ENCOUNTER — Other Ambulatory Visit: Payer: Managed Care, Other (non HMO)

## 2021-07-20 ENCOUNTER — Ambulatory Visit (INDEPENDENT_AMBULATORY_CARE_PROVIDER_SITE_OTHER)
Admission: RE | Admit: 2021-07-20 | Discharge: 2021-07-20 | Disposition: A | Discharge status: Home / Self Care | Payer: Managed Care, Other (non HMO) | Source: Ambulatory Visit | Attending: Pulmonary Disease | Admitting: Pulmonary Disease

## 2021-07-20 ENCOUNTER — Ambulatory Visit (INDEPENDENT_AMBULATORY_CARE_PROVIDER_SITE_OTHER): Payer: Managed Care, Other (non HMO) | Admitting: Pulmonary Disease

## 2021-07-20 ENCOUNTER — Other Ambulatory Visit: Payer: Self-pay

## 2021-07-20 ENCOUNTER — Other Ambulatory Visit (HOSPITAL_COMMUNITY): Payer: Self-pay

## 2021-07-20 ENCOUNTER — Ambulatory Visit: Payer: Managed Care, Other (non HMO) | Admitting: Hematology and Oncology

## 2021-07-20 ENCOUNTER — Ambulatory Visit: Payer: Managed Care, Other (non HMO)

## 2021-07-20 VITALS — BP 160/100 | HR 68 | Temp 98.2°F | Ht 70.0 in | Wt 210.0 lb

## 2021-07-20 DIAGNOSIS — J849 Interstitial pulmonary disease, unspecified: Secondary | ICD-10-CM | POA: Diagnosis not present

## 2021-07-20 DIAGNOSIS — R0602 Shortness of breath: Secondary | ICD-10-CM

## 2021-07-20 DIAGNOSIS — R9389 Abnormal findings on diagnostic imaging of other specified body structures: Secondary | ICD-10-CM | POA: Diagnosis not present

## 2021-07-20 DIAGNOSIS — J4520 Mild intermittent asthma, uncomplicated: Secondary | ICD-10-CM

## 2021-07-20 DIAGNOSIS — J189 Pneumonia, unspecified organism: Secondary | ICD-10-CM | POA: Diagnosis not present

## 2021-07-20 DIAGNOSIS — R0600 Dyspnea, unspecified: Secondary | ICD-10-CM

## 2021-07-20 DIAGNOSIS — C9001 Multiple myeloma in remission: Secondary | ICD-10-CM

## 2021-07-20 DIAGNOSIS — R0609 Other forms of dyspnea: Secondary | ICD-10-CM

## 2021-07-20 DIAGNOSIS — F419 Anxiety disorder, unspecified: Secondary | ICD-10-CM | POA: Insufficient documentation

## 2021-07-20 NOTE — Progress Notes (Signed)
Chest x-ray stable.  Still showing interstitial lung disease.  No signs of pneumonia.  Lets proceed forward the high-resolution CT chest as outlined.  Keep follow-up with our office.  Wyn Quaker, FNP

## 2021-07-20 NOTE — Progress Notes (Signed)
_0  ID: Samuel [name], male    DOB: 1956-04-01, 65 y.o.   MRN: [id number]  Chief Complaint  Patient presents with   Follow-up    Got covid in April has been having increased shortness of breath, cough continuously clearing his throat     Referring provider: Antony Contras, MD  HPI:  65 year old male former smoker fonder office for shortness of breath and interstitial lung disease.  PMH: High cholesterol, multiple myeloma Smoker/ Smoking History: Former smoker.  Quit 1984. Maintenance: Breo 200, long term pred taper  Pt of: Dr. [name]  07/20/2021  - Visit   65 year old male former smoker followed in our office for shortness of breath and interstitial lung disease.  Established with Dr. [name].  Initially consulted when inpatient with our team in August/2022.  Chief complaint at that point time was dyspnea.  He was diagnosed with multiple myeloma in the spring 2022 when a bruise was discovered while he was recovering from a sports massage.  He went for further work-up and he was found to have abnormal lab work this led to a bone marrow biopsy confirming diagnosis of multiple myeloma.  He has been followed by Dr. [name].  He was in the middle of treating him with VRD chemotherapy since May/20 04/2021.  He was in the middle of his fifth cycle.  Patient also had [place] in April/2022.  Chest CT in April/2022 showed ill-defined interstitial changes in the periphery.  Concern for [name] lung toxicity.  Patient also had a bronchoscopy performed in 06/18/2021.  Eosinophils 51%, lymphs 18%, no malignant cells identified patient was started on a long-term prednisone taper.  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  Still currently having significant symptoms of shortness of breath.    Last seen in our office on 07/12/2021 by Dr. [name].  Plan of care at that office visit was as follows: Stop [name], check chest x-ray, check CBC with  differential, blood ESR, blood IgE, vitamin D, hemoglobin A1c, G6PD, walk test, check Ono at home, change prednisone to 50 mg daily for 2 weeks, then 40 mg daily for 2 weeks, then 30 mg daily for 2 weeks, then 20 mg daily for 2 weeks, 2-week follow-up with APP or Dr. [name], 4-week follow-up with Dr. [name] and 30-minute time slot.  Per chart review on 07/13/2021 [name] was discontinued.  Patient was also seen by Dr. [name] on 07/18/2021 I am unable to view this note.  Patient reporting that weight team would like for him to resume [name] at a maintenance dose of 10 mg.  This is the current plan.  He will see oncology next Friday for blood work.  Patient is scheduled to complete an overnight oximetry test next Tuesday.  He remains adherent to his prednisone taper 50 mg daily.  Patient reports that this weekend he did play 9 holes of golf on Saturday and on Sunday.  He was limited by his physical exertion.  He also exercised on Tuesday and worked out with a Clinical research associate.  Patient is eager to get back to baseline physical activity.  Patient reports that on Wednesday (07/18/2021) of this week he had increased shortness of breath, cough, worsened acid reflux and he vomited.  Patient believes that this may also be due to the fact that he ate a dinner meal quite quickly.  This sometimes happens when he does this.  He also reports that his blood pressure was high at that time.  Patient reports  that he has been off the [name] for at least 1 month.  Patient has stopped his allopurinol as of 07/18/2021.  Patient and spouse are both frustrated regarding dyspnea and have hopes that he would be improving quicker.  There are also concerns that he may have acute worsened symptoms or an acute infection such as bronchitis.  We will discuss and evaluate for this today.  Walk today in office: 07/20/2021-completed 2 laps on room air, dropped to 93%    Questionaires / Pulmonary Flowsheets:   ACT:  No flowsheet  data found.  MMRC: mMRC Dyspnea Scale mMRC Score  07/20/2021 3  07/12/2021 3    Epworth:  No flowsheet data found.  Tests:   07/12/2021-G6PD-14.8 07/12/2021-IgE-22 07/12/2021-sed rate-65 07/12/2021-CBC with differential-eosinophils 0.1  07/12/2021-chest x-ray-appearance of chest just interstitial lung disease, as above and could be better characterized with a high-resolution chest CT if clinically appropriate   FENO:  No results found for: NITRICOXIDE  PFT: No flowsheet data found.  WALK:  SIX MIN WALK 07/20/2021 07/12/2021  Supplimental Oxygen during Test? (L/min) No No  Tech Comments: Patient was able to walk 2 laps without stopping did express come shortness of breath and expressed he needed to stop and go back to room after 2nd lap. pt walked at a slow pace completing all required laps denying any complaints.    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 EXAM: CHEST - 2 VIEW COMPARISON:  Chest x-ray 06/18/2021. FINDINGS: Lung volumes are low. Diffuse interstitial prominence throughout the lungs bilaterally with no discernible craniocaudal gradient. No pleural effusions. No pneumothorax. No cephalization of the pulmonary vasculature. Heart size is normal. Upper mediastinal contours are within normal limits. IMPRESSION: 1. The appearance the chest suggests interstitial lung disease, as above, and could be better characterized with high-resolution chest CT if clinically appropriate. Electronically Signed   By: Vinnie Langton M.D.   On: 07/12/2021 16:16    Lab Results:  CBC    Component Value Date/Time   WBC 9.8 07/18/2021 1042   WBC 9.3 07/12/2021 0954   RBC 4.13 (L) 07/18/2021 1042   HGB 13.2 07/18/2021 1042   HCT 38.2 (L) 07/18/2021 1042   PLT 271 07/18/2021 1042   MCV 92.5 07/18/2021 1042   MCH 32.0 07/18/2021 1042   MCHC 34.6 07/18/2021 1042   RDW 16.8 (H) 07/18/2021 1042   LYMPHSABS  1.8 07/18/2021 1042   MONOABS 0.7 07/18/2021 1042   EOSABS 0.0 07/18/2021 1042   BASOSABS 0.0 07/18/2021 1042    BMET    Component Value Date/Time   NA 129 (L) 07/18/2021 1042   K 4.1 07/18/2021 1042   CL 95 (L) 07/18/2021 1042   CO2 24 07/18/2021 1042   GLUCOSE 79 07/18/2021 1042   BUN 25 (H) 07/18/2021 1042   CREATININE 0.85 07/18/2021 1042   CALCIUM 8.5 (L) 07/18/2021 1042   GFRNONAA >60 07/18/2021 1042    BNP    Component Value Date/Time   BNP 32.3 06/15/2021 1416    ProBNP No results found for: PROBNP  Specialty Problems       Pulmonary Problems   SOB (shortness of breath)   Allergic rhinitis   Mild intermittent asthma   Dyspnea   Hypoxia   Interstitial pulmonary disease (HCC)   Pneumonitis    Felt to be related to [name] BAL shows significant eosinophilia       Allergies  Allergen Reactions  Clarithromycin    Penicillins     Immunization History  Administered Date(s) Administered   Influenza Split 10/27/2017   Influenza, High Dose Seasonal PF 12/01/2017, 11/29/2019, 12/19/2020   Influenza, Quadrivalent, Recombinant, Inj, Pf 07/28/2019, 08/11/2020   Influenza-Unspecified 11/26/2011, 07/21/2017   PFIZER(Purple Top)SARS-COV-2 Vaccination 12/27/2019, 01/24/2020, 09/21/2020   Tdap 08/20/2005, 09/07/2015   Zoster, Live 11/06/2017, 05/07/2018    Past Medical History:  Diagnosis Date   Asthma    High cholesterol    under control.    Perennial allergic rhinitis    Sciatic pain, right    Seasonal allergic rhinitis    Thyroid disease     Tobacco History: Social History   Tobacco Use  Smoking Status Former   Packs/day: 0.10   Years: 3.00   Pack years: 0.30   Types: Cigarettes   Quit date: 10/21/1982   Years since quitting: 38.7  Smokeless Tobacco Never   Counseling given: Not Answered   Continue to not smoke  Outpatient Encounter Medications as of 07/20/2021  Medication Sig   acyclovir (ZOVIRAX) 400 MG tablet Take 1 tablet (400 mg  total) by mouth 2 (two) times daily.   albuterol (VENTOLIN HFA) 108 (90 Base) MCG/ACT inhaler Inhale 2 puffs into the lungs every 6 (six) hours as needed for wheezing or shortness of breath.   allopurinol (ZYLOPRIM) 300 MG tablet Take 300 mg by mouth daily.   ALPRAZolam (XANAX) 1 MG tablet Take 1 mg by mouth 2 (two) times daily as needed for anxiety.   aspirin EC 81 MG tablet Take 81 mg by mouth daily. Swallow whole.   ASSESS FULL RANGE PEAK METER DEVI as directed.   BREO ELLIPTA 200-25 MCG/INH AEPB Inhale 1 puff into the lungs daily.   cholecalciferol (VITAMIN D) 1000 UNITS tablet Take 3,000 Units by mouth daily.   EPINEPHrine (EPI-PEN) 0.3 mg/0.3 mL DEVI Inject 0.3 mg into the muscle once as needed.   ezetimibe-simvastatin (VYTORIN) 10-40 MG per tablet Take 1 tablet by mouth at bedtime.   furosemide (LASIX) 20 MG tablet Take 1 tablet (20 mg total) by mouth as needed for fluid or edema.   lenalidomide ([name]) 10 MG capsule Take 1 capsule (10 mg total) by mouth daily. [employer] Auth # [id number]   Date Obtained 07/19/21  Take 1 capsule daily x 14 days, none for 7 days   levothyroxine (SYNTHROID) 150 MCG tablet Take 150 mcg by mouth every other day. Alternates with 175 mcg   levothyroxine (SYNTHROID) 175 MCG tablet Take 1 tablet by mouth every other day.   levothyroxine (SYNTHROID) 50 MCG tablet Take by mouth.   montelukast (SINGULAIR) 10 MG tablet Take 10 mg by mouth at bedtime.   Multiple Vitamin (MULTIVITAMIN) tablet Take 1 tablet by mouth daily.   omeprazole (PRILOSEC) 40 MG capsule Take 1 capsule by mouth daily.   ondansetron (ZOFRAN) 8 MG tablet Take 1 tablet (8 mg total) by mouth every 8 (eight) hours as needed for nausea or vomiting.   predniSONE (DELTASONE) 10 MG tablet Take 5 tablets (50 mg total) by mouth daily with breakfast for 7 days, THEN 4 tablets (40 mg total) daily with breakfast for 7 days, THEN 3 tablets (30 mg total) daily with breakfast for 7 days, THEN 2 tablets (20 mg total)  daily with breakfast for 7 days, THEN 1 tablet (10 mg total) daily with breakfast for 7 days, THEN 0.5 tablets (5 mg total) daily with breakfast for 7 days.   predniSONE (DELTASONE) 10 MG tablet Take  5 tablets (50 mg total) by mouth daily with breakfast for 14 days, THEN 4 tablets (40 mg total) daily with breakfast for 14 days, THEN 3 tablets (30 mg total) daily with breakfast for 14 days, THEN 2 tablets (20 mg total) daily with breakfast for 14 days. Once you reach 72m, remain on [id number] further notice..   prochlorperazine (COMPAZINE) 10 MG tablet Take 1 tablet (10 mg total) by mouth every 6 (six) hours as needed for nausea or vomiting.   sildenafil (REVATIO) 20 MG tablet 2-5 tablets one hour prior to sexual intimacy   tamsulosin (FLOMAX) 0.4 MG CAPS capsule Take 0.4 mg by mouth daily.   triamcinolone cream (KENALOG) 0.1 % Apply topically.   No facility-administered encounter medications on file as of 07/20/2021.     Review of Systems  Review of Systems  Constitutional:  Positive for fatigue. Negative for activity change, chills, fever and unexpected weight change.  HENT:  Positive for congestion. Negative for postnasal drip, rhinorrhea, sinus pressure, sinus pain and sore throat.   Eyes: Negative.   Respiratory:  Positive for cough, shortness of breath and wheezing.   Cardiovascular:  Negative for chest pain and palpitations.  Gastrointestinal:  Negative for constipation, diarrhea, nausea and vomiting.  Endocrine: Negative.   Genitourinary: Negative.   Musculoskeletal: Negative.   Skin: Negative.   Neurological:  Negative for dizziness and headaches.  Psychiatric/Behavioral: Negative.  Negative for dysphoric mood. The patient is not nervous/anxious.   All other systems reviewed and are negative.   Physical Exam  BP (!) 160/100 (BP Location: Right Arm, Patient Position: Sitting, Cuff Size: Normal)   Pulse 68   Temp 98.2 F (36.8 C) (Oral)   Ht _0  (1.778 m)   Wt 210 lb (95.3  kg)   SpO2 96%   BMI 30.13 kg/m   Wt Readings from Last 5 Encounters:  07/20/21 210 lb (95.3 kg)  07/18/21 211 lb 6.4 oz (95.9 kg)  07/12/21 209 lb (94.8 kg)  07/05/21 210 lb 6.4 oz (95.4 kg)  06/29/21 209 lb 6.4 oz (95 kg)    BMI Readings from Last 5 Encounters:  07/20/21 30.13 kg/m  07/18/21 30.33 kg/m  07/12/21 29.99 kg/m  07/05/21 30.19 kg/m  06/29/21 30.05 kg/m     Physical Exam Vitals and nursing note reviewed.  Constitutional:      General: He is not in acute distress.    Appearance: Normal appearance. He is obese.  HENT:     Head: Normocephalic and atraumatic.     Right Ear: Hearing, tympanic membrane, ear canal and external ear normal. There is no impacted cerumen.     Left Ear: Hearing, tympanic membrane, ear canal and external ear normal. There is no impacted cerumen.     Nose: Nose normal. No mucosal edema or rhinorrhea.     Right Turbinates: Not enlarged.     Left Turbinates: Not enlarged.     Mouth/Throat:     Mouth: Mucous membranes are dry.     Pharynx: Oropharynx is clear. No oropharyngeal exudate.  Eyes:     Pupils: Pupils are equal, round, and reactive to light.  Cardiovascular:     Rate and Rhythm: Normal rate and regular rhythm.     Pulses: Normal pulses.     Heart sounds: Normal heart sounds. No murmur heard. Pulmonary:     Effort: Respiratory distress present.     Breath sounds: Examination of the right-upper field reveals wheezing. Examination of the right-middle field reveals  wheezing. Examination of the right-lower field reveals decreased breath sounds. Examination of the left-lower field reveals decreased breath sounds. Decreased breath sounds and wheezing present. No rales.     Comments: Scattered squeaks Chest:     Chest wall: Tenderness present.  Musculoskeletal:     Cervical back: Normal range of motion.     Right lower leg: No edema.     Left lower leg: No edema.  Lymphadenopathy:     Cervical: No cervical adenopathy.   Skin:    General: Skin is warm and dry.     Capillary Refill: Capillary refill takes less than 2 seconds.     Findings: No erythema or rash.  Neurological:     General: No focal deficit present.     Mental Status: He is alert and oriented to person, place, and time.     Motor: No weakness.     Coordination: Coordination normal.     Gait: Gait is intact. Gait normal.  Psychiatric:        Mood and Affect: Mood normal.        Behavior: Behavior normal. Behavior is cooperative.        Thought Content: Thought content normal.        Judgment: Judgment normal.      Assessment & Plan:   Discussion: Called and discussed case with Dr. [name].  Dr. [name] reporting that he spoke with Dr. [name] on 07/18/2021.  Unfortunately patient does not have options of other medications to replace the [name] with.  Oncology has concerns that if patient is taken off the [name] completely within the multiply [facility] will return.  Oncology to have risk benefits conversation with patient to explain there is potential for pneumonitis with [name] even at low maintenance doses.  Should patient have worsened hypoxemia patient may need to be considered for inpatient stay with IV steroids.  With walk today stable in office we will continue to clinically monitor and proceed forward with outpatient high-resolution CT chest as well as close follow-up next week with EW NP.  Patient to see Dr. [name] in 2 weeks.  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 [name] on 07/12/2021, stable walk in office Pending ONO  Discussion: Contacted and spoke with Dr. [name].  He reports that he spoke with Dr. [name].  Unfortunately patient has no other medication options other than low-dose [name] for management of the multiple myeloma.  We will have to continue to clinically monitor.   Dr. [name] was going to review this and discuss risk and benefits with the patient.  Plan: Chest x-ray today Ordered high-resolution CT chest Lab work today Continue prednisone taper as outlined Walk test today in office >>> If patient now has desaturations will need to consider inpatient stay for steroids Complete overnight oximetry test next week as scheduled We will get patient scheduled for pulmonary function testing as quickly as possible Keep upcoming appointment with Dr. [name]   Abnormal CT of the chest ILD felt to be related to drug pneumonitis  Plan: Continue prednisone taper Chest x-ray today Walk test today in office High-resolution CT chest ordered  Anxiety Patient has chronic anxiety PCP has prescribed as needed Xanax  Plan: Okay for patient to take prescribed Xanax to help with management of dyspnea and anxiety  Multiple myeloma ([place]) Managed by Dr. 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 [name] and I discussed the case today.  Dr. [name] reports that he spoke with Dr. [name] on 07/18/2021 patient to be on low-dose [name] 10 mg daily.  Plan: Lab work today Chest x-ray today We will order high-resolution CT chest Walk today in office Complete overnight oximetry as scheduled next week Will need to get patient scheduled for pulmonary function testing  Mild intermittent asthma Plan: Continue Breo Ellipta 200  Interstitial pulmonary disease ([place]) Plan: Continue prednisone taper as outlined Walk today in office Chest x-ray today High-resolution CT chest ordered    Return in about 2 weeks (around 08/03/2021), or if symptoms worsen or fail to improve, for Follow up  with Dr. Purnell Shoemaker, ILD clinic - 42mn slot.   BLauraine Rinne NP 07/20/2021   This appointment required 56 minutes of patient care (this includes precharting, chart review, review of results, face-to-face care, etc.).

## 2021-07-20 NOTE — Assessment & Plan Note (Signed)
Plan: Continue prednisone taper as outlined Walk today in office Chest x-ray today High-resolution CT chest ordered

## 2021-07-20 NOTE — Patient Instructions (Addendum)
You were seen today by Lauraine Rinne, NP  for:   1. Dyspnea on exertion 2. Shortness of breath  - Pro b natriuretic peptide (BNP); Future - DG Chest 2 View; Future - CT Chest High Resolution; Future - Pro b natriuretic peptide (BNP); Future  Complete chest x-ray today >>> Proceed to [address] enter building and proceed to basement floor, proceed to radiology/x-ray department  Lab work today, this will check for fluid overload  Walk today in office  Complete overnight oximetry test as outlined next week  We will need to get you scheduled for pulmonary function testing  I have discussed your case with Dr. [name].  We agreed to proceed forward with high-resolution CT chest imaging  3. Abnormal CT of the chest 4. Interstitial pulmonary disease (Candelaria Arenas) 5. Pneumonitis  - CT Chest High Resolution; Future - DG Chest 2 View; Future  Chest x-ray today  High-resolution CT chest ordered  We need to get you scheduled for pulmonary function testing  6. Anxiety  Okay to take as needed Xanax as prescribed by primary care  7. Multiple myeloma in remission [facility])  Continue to follow-up with oncology Dr. [name]  I will also route my note to Dr. [name] as well as Dr. [name]  8. Mild intermittent asthma, unspecified whether complicated  Breo Ellipta 200 >>> Take 1 puff daily in the morning right when you wake up >>>Rinse your mouth out after use >>>This is a daily maintenance inhaler, NOT a rescue inhaler >>>Contact our office if you are having difficulties affording or obtaining this medication >>>It is important for you to be able to take this daily and not miss any doses     We recommend today:  Orders Placed This Encounter  Procedures   CT Chest High Resolution    -High-res CT with supine and prone positioning -inspiratory and expiratory cuts.    Standing Status:   Future    Standing Expiration Date:   07/20/2022    Scheduling Instructions:     Schedule as  quickly as possible    Order Specific Question:   Preferred imaging location?    Answer:   [facility] [employer]   DG Chest 2 View    Standing Status:   Future    Standing Expiration Date:   11/19/2021    Order Specific Question:   Reason for Exam (SYMPTOM  OR DIAGNOSIS REQUIRED)    Answer:   ild / dyspnea    Order Specific Question:   Preferred imaging location?    Answer:   [facility]    Order Specific Question:   Radiology Contrast Protocol - do NOT remove file path    Answer:   [email or web address]   Pro b natriuretic peptide (BNP)    Standing Status:   Future    Standing Expiration Date:   07/20/2022   Orders Placed This Encounter  Procedures   CT Chest High Resolution   DG Chest 2 View   Pro b natriuretic peptide (BNP)   No orders of the defined types were placed in this encounter.   Follow Up:    Return in about 2 weeks (around 08/03/2021), or if symptoms worsen or fail to improve, for Follow up with Dr. Purnell Shoemaker, IL[facility] clinic - 24mn slot.  Patient also needs to be scheduled for pulmonary function testing -60-minute slot, as soon as possible.  Notification of test results are managed in the following manner: If there are  any recommendations  or changes to the  plan of care discussed in office today,  we will contact you and let you know what they are. If you do not hear from [place], then your results are normal and you can view them through your  MyChart account , or a letter will be sent to you. Thank you again for trusting [place] with your care  - Thank you, [facility]    It is flu season:   >>> Best ways to protect herself from the flu: Receive the yearly flu vaccine, practice good hand hygiene washing with soap and also using hand sanitizer when available, eat a nutritious meals, get adequate rest, hydrate appropriately       Please contact the office if your symptoms worsen or you have concerns that you  are not improving.   Thank you for choosing [facility] for your healthcare, and for allowing [place] to partner with you on your healthcare journey. I am thankful to be able to provide care to you today.   Wyn Quaker FNP-C

## 2021-07-20 NOTE — Progress Notes (Signed)
Spoke with pt and voices understanding. Pt did not have any further questions.

## 2021-07-20 NOTE — Assessment & Plan Note (Signed)
Managed by Dr. [name]  Plan: Continue follow-up with outpatient oncology Continue forward with [name] 10 mg maintenance therapy as discussed by oncology

## 2021-07-20 NOTE — Assessment & Plan Note (Signed)
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

## 2021-07-20 NOTE — Assessment & Plan Note (Signed)
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 [name] and I discussed the case today.  Dr. [name] reports that he spoke with Dr. [name] on 07/18/2021 patient to be on low-dose [name] 10 mg daily.  Plan: Lab work today Chest x-ray today We will order high-resolution CT chest Walk today in office Complete overnight oximetry as scheduled next week Will need to get patient scheduled for pulmonary function testing

## 2021-07-20 NOTE — Assessment & Plan Note (Signed)
Patient has chronic anxiety PCP has prescribed as needed Xanax  Plan: Okay for patient to take prescribed Xanax to help with management of dyspnea and anxiety

## 2021-07-20 NOTE — Assessment & Plan Note (Signed)
Plan: Continue Breo Ellipta 200

## 2021-07-20 NOTE — Assessment & Plan Note (Signed)
Felt to be related to [name] Currently on 50 mg of prednisone daily Last seen by Dr. [name] on 07/12/2021, stable walk in office Pending ONO  Discussion: Contacted and spoke with Dr. [name].  He reports that he spoke with Dr. [name].  Unfortunately patient has no other medication options other than low-dose [name] for management of the multiple myeloma.  We will have to continue to clinically monitor.  Dr. [name] was going to review this and discuss risk and benefits with the patient.  Plan: Chest x-ray today Ordered high-resolution CT chest Lab work today Continue prednisone taper as outlined Walk test today in office >>> If patient now has desaturations will need to consider inpatient stay for steroids Complete overnight oximetry test next week as scheduled We will get patient scheduled for pulmonary function testing as quickly as possible Keep upcoming appointment with Dr. [name]

## 2021-07-21 LAB — PRO B NATRIURETIC PEPTIDE: NT-Pro BNP: 47 pg/mL (ref 0–210)

## 2021-07-22 ENCOUNTER — Encounter: Payer: Self-pay | Admitting: Hematology and Oncology

## 2021-07-22 NOTE — Progress Notes (Signed)
Samuel [name] Telephone:([phone]   Fax:([phone]) [phone]  PROGRESS NOTE  Patient Care Team: Samuel Contras, MD as PCP - General (Family Medicine)  Hematological/Oncological History # IgG Kappa Multiple Myeloma 02/02/2021:  Presented to [facility] ED due to right sided flank tenderness with bruising. CT abdomen/pelvis: Multiple small lytic lesions in the thoracolumbar spine and bilateral pelvis -SPEP: IgG 2,082 (H), M Protein 1.8 (H). IFE shows IgG monoclonal protein with kappa light chain specificity.  -LDH 169, CBC normal, CMP normal except for sodium 131 (L), Chloride 96 (L).   02/14/2021: Establish care with Samuel Query PA-C 02/22/2021: bone marrow biopsy confirms the diagnosis of Multiple Myeloma with a monoclonal plasma cell population.  03/16/2021: Cycle 1 Day 1 of VRd chemotherapy  04/06/2021: Cycle 2 Day 1 of VRd chemotherapy  04/27/2021: Cycle 3 Day 1 of VRd chemotherapy  05/18/2021: Cycle 4 Day 1 of VRd chemotherapy  06/01/2021: drop dexamethasone to 37m PO weekly and start lasix due to shortness of breath.  06/15/2021: Desaturation to 87% on ambulation. HELD velcade today and sent to ED for evaluation.  06/22/2021: Findings consistent with drug reaction the lungs with eosinophils on BAL.  Given these findings we will definitely hold [name] and plan to avoid pomalidomide 06/29/2021: Cycle 1 Day 1 CyBorD chemotherapy  Interval History:  VDemetrion [name] 65y.o. male with medical history significant for IgG Kappa multiple myeloma who presents for a follow up visit. The patient's last visit was on 07/05/2021.  In the interim since last visit chemotherapy has been held due to worsening of his respiratory symptoms.   On exam today Samuel [name] is accompanied by his wife.  He unfortunately continues to struggle with shortness of breath.  He has met with pulmonary earlier this week who recommended continuing to hold chemotherapy.  Additionally [facility] [place] with [name] 10 mg p.o. with cyclophosphamide and dexamethasone.  Given the patient's respiratory status today we decided to hold on further treatments of chemotherapy until his respiratory status improved. He denies any fevers, chills, sweats, nausea, vomiting or diarrhea.  A full 10 point ROS is listed below.  MEDICAL HISTORY:  Past Medical History:  Diagnosis Date   Asthma    High cholesterol    under control.    Perennial allergic rhinitis    Sciatic pain, right    Seasonal allergic rhinitis    Thyroid disease     SURGICAL HISTORY: Past Surgical History:  Procedure Laterality Date   BRONCHIAL BIOPSY  06/18/2021   Procedure: BRONCHIAL BIOPSIES;  Surgeon: BCollene Gobble MD;  Location: [facility];  Service: Cardiopulmonary;;   BRONCHIAL WASHINGS  06/18/2021   Procedure: BRONCHIAL WASHINGS;  Surgeon: BCollene Gobble MD;  Location: [facility];  Service: Cardiopulmonary;;   NASAL SINUS SURGERY     NECK SURGERY  1996   VIDEO BRONCHOSCOPY N/A 06/18/2021   Procedure: VIDEO BRONCHOSCOPY WITH FLUORO;  Surgeon: BCollene Gobble MD;  Location: [facility];  Service: Cardiopulmonary;  Laterality: N/A;    SOCIAL HISTORY: Social History   Socioeconomic History   Marital status: Married    Spouse name: Not on file   Number of children: Not on file   Years of education: Not on file   Highest education level: Not on file  Occupational History   Not on file  Tobacco Use   Smoking status: Former    Packs/day: 0.10  Years: 3.00    Pack years: 0.30    Types: Cigarettes    Quit date: 10/21/1982    Years since quitting: 38.7   Smokeless tobacco: Never  Vaping Use   Vaping Use: Never used  Substance and Sexual Activity   Alcohol use: Yes    Comment: 1 drink daily   Drug use: No   Sexual activity: Not on file  Other Topics Concern   Not on file  Social History Narrative   Not on file   Social  Determinants of Health   Financial Resource Strain: Not on file  Food Insecurity: Not on file  Transportation Needs: Not on file  Physical Activity: Not on file  Stress: Not on file  Social Connections: Not on file  Intimate Partner Violence: Not on file    FAMILY HISTORY: Family History  Problem Relation Age of Onset   Allergies Father    Allergies Mother    Hypertension Other    Heart disease Other    Breast cancer Paternal Grandmother     ALLERGIES:  is allergic to clarithromycin and penicillins.  MEDICATIONS:  Current Outpatient Medications  Medication Sig Dispense Refill   aspirin EC 81 MG tablet Take 81 mg by mouth daily. Swallow whole.     acyclovir (ZOVIRAX) 400 MG tablet Take 1 tablet (400 mg total) by mouth 2 (two) times daily. 60 tablet 3   albuterol (VENTOLIN HFA) 108 (90 Base) MCG/ACT inhaler Inhale 2 puffs into the lungs every 6 (six) hours as needed for wheezing or shortness of breath. 8 g 1   allopurinol (ZYLOPRIM) 300 MG tablet Take 300 mg by mouth daily.     ALPRAZolam (XANAX) 1 MG tablet Take 1 mg by mouth 2 (two) times daily as needed for anxiety.     ASSESS FULL RANGE PEAK METER DEVI as directed.     BREO ELLIPTA 200-25 MCG/INH AEPB Inhale 1 puff into the lungs daily.     cholecalciferol (VITAMIN D) 1000 UNITS tablet Take 3,000 Units by mouth daily.     EPINEPHrine (EPI-PEN) 0.3 mg/0.3 mL DEVI Inject 0.3 mg into the muscle once as needed.     ezetimibe-simvastatin (VYTORIN) 10-40 MG per tablet Take 1 tablet by mouth at bedtime.     furosemide (LASIX) 20 MG tablet Take 1 tablet (20 mg total) by mouth as needed for fluid or edema. 14 tablet 1   lenalidomide ([name]) 10 MG capsule Take 1 capsule (10 mg total) by mouth daily. [employer] Auth # [id number]   Date Obtained 07/19/21  Take 1 capsule daily x 14 days, none for 7 days 14 capsule 0   levothyroxine (SYNTHROID) 150 MCG tablet Take 150 mcg by mouth every other day. Alternates with 175 mcg     levothyroxine  (SYNTHROID) 175 MCG tablet Take 1 tablet by mouth every other day.     levothyroxine (SYNTHROID) 50 MCG tablet Take by mouth.     montelukast (SINGULAIR) 10 MG tablet Take 10 mg by mouth at bedtime.     Multiple Vitamin (MULTIVITAMIN) tablet Take 1 tablet by mouth daily.     omeprazole (PRILOSEC) 40 MG capsule Take 1 capsule by mouth daily.     ondansetron (ZOFRAN) 8 MG tablet Take 1 tablet (8 mg total) by mouth every 8 (eight) hours as needed for nausea or vomiting. 30 tablet 0   predniSONE (DELTASONE) 10 MG tablet Take 5 tablets (50 mg total) by mouth daily with breakfast for 7 days, THEN 4 tablets (  40 mg total) daily with breakfast for 7 days, THEN 3 tablets (30 mg total) daily with breakfast for 7 days, THEN 2 tablets (20 mg total) daily with breakfast for 7 days, THEN 1 tablet (10 mg total) daily with breakfast for 7 days, THEN 0.5 tablets (5 mg total) daily with breakfast for 7 days. 108.5 tablet 0   predniSONE (DELTASONE) 10 MG tablet Take 5 tablets (50 mg total) by mouth daily with breakfast for 14 days, THEN 4 tablets (40 mg total) daily with breakfast for 14 days, THEN 3 tablets (30 mg total) daily with breakfast for 14 days, THEN 2 tablets (20 mg total) daily with breakfast for 14 days. Once you reach 58m, remain on 265muntil further notice.. 196 tablet 0   prochlorperazine (COMPAZINE) 10 MG tablet Take 1 tablet (10 mg total) by mouth every 6 (six) hours as needed for nausea or vomiting. 30 tablet 0   sildenafil (REVATIO) 20 MG tablet 2-5 tablets one hour prior to sexual intimacy     tamsulosin (FLOMAX) 0.4 MG CAPS capsule Take 0.4 mg by mouth daily.     triamcinolone cream (KENALOG) 0.1 % Apply topically.     No current facility-administered medications for this visit.    REVIEW OF SYSTEMS:   Constitutional: ( - ) fevers, ( - )  chills , ( - ) night sweats Eyes: ( - ) blurriness of vision, ( - ) double vision, ( - ) watery eyes Ears, nose, mouth, throat, and face: ( - ) mucositis, (  - ) sore throat Respiratory: ( - ) cough, ( - ) dyspnea, ( - ) wheezes Cardiovascular: ( - ) palpitation, ( - ) chest discomfort, ( - ) lower extremity swelling Gastrointestinal:  ( - ) nausea, ( - ) heartburn, ( - ) change in bowel habits Skin: ( - ) abnormal skin rashes Lymphatics: ( - ) new lymphadenopathy, ( - ) easy bruising Neurological: ( - ) numbness, ( - ) tingling, ( - ) new weaknesses Behavioral/Psych: ( - ) mood change, ( - ) new changes  All other systems were reviewed with the patient and are negative.  PHYSICAL EXAMINATION: ECOG PERFORMANCE STATUS: 0 - Asymptomatic  Vitals:   07/18/21 1138  BP: (!) 165/98  Pulse: 63  Resp: 17  Temp: 98 F (36.7 C)  SpO2: 98%     Filed Weights   07/18/21 1138  Weight: 211 lb 6.4 oz (95.9 kg)    GENERAL: well appearing middle aged Caucasian male alert, no distress and comfortable SKIN: skin color, texture, turgor are normal, no rashes or significant lesions EYES: conjunctiva are pink and non-injected, sclera clear LUNGS: Decreased airflow with no crackles or rails.  No wheezing noted. HEART: regular rate & rhythm and no murmurs and no lower extremity edema Musculoskeletal: no cyanosis of digits and no clubbing  PSYCH: alert & oriented x 3, fluent speech NEURO: no focal motor/sensory deficits  LABORATORY DATA:  I have reviewed the data as listed CBC Latest Ref Rng & Units 07/18/2021 07/12/2021 07/05/2021  WBC 4.0 - 10.5 K/uL 9.8 9.3 9.4  Hemoglobin 13.0 - 17.0 g/dL 13.2 13.1 13.2  Hematocrit 39.0 - 52.0 % 38.2(L) 38.8(L) 38.7(L)  Platelets 150 - 400 K/uL 271 297.0 161    CMP Latest Ref Rng & Units 07/18/2021 07/05/2021 06/29/2021  Glucose 70 - 99 mg/dL 79 105(H) 97  BUN 8 - 23 mg/dL 25(H) 18 20  Creatinine 0.61 - 1.24 mg/dL 0.85 0.95 1.04  Sodium  135 - 145 mmol/L 129(L) 128(L) 130(L)  Potassium 3.5 - 5.1 mmol/L 4.1 4.7 4.6  Chloride 98 - 111 mmol/L 95(L) 96(L) 97(L)  CO2 22 - 32 mmol/L _0 Calcium 8.9 - 10.3 mg/dL  8.5(L) 9.1 8.8(L)  Total Protein 6.5 - 8.1 g/dL 6.9 7.1 7.1  Total Bilirubin 0.3 - 1.2 mg/dL 0.5 0.5 0.6  Alkaline Phos 38 - 126 U/L 68 55 54  AST 15 - 41 U/L _1 ALT 0 - 44 U/L 28 23 36    Lab Results  Component Value Date   MPROTEIN Not Observed 06/29/2021   MPROTEIN Not Observed 06/08/2021   MPROTEIN 0.4 (H) 05/11/2021   Lab Results  Component Value Date   KPAFRELGTCHN 22.6 (H) 06/29/2021   KPAFRELGTCHN 28.5 (H) 06/08/2021   KPAFRELGTCHN 29.4 (H) 05/11/2021   LAMBDASER 11.2 06/29/2021   LAMBDASER 14.2 06/08/2021   LAMBDASER 14.7 05/11/2021   KAPLAMBRATIO 2.02 (H) 06/29/2021   KAPLAMBRATIO 2.01 (H) 06/08/2021   KAPLAMBRATIO 2.00 (H) 05/11/2021    RADIOGRAPHIC STUDIES: I have personally reviewed the radiological images as listed and agreed with the findings in the report: lytic lesions in the hip bones bilaterally.  DG Chest 2 View  Result Date: 07/20/2021 CLINICAL DATA:  65 year old male with history of dyspnea. EXAM: CHEST - 2 VIEW COMPARISON:  Chest x-ray 07/12/2021. FINDINGS: Lung volumes are low. Diffuse interstitial prominence scattered throughout the lungs bilaterally. Diffuse peribronchial cuffing. No confluent consolidative airspace disease. No pleural effusions. No evidence of pulmonary edema. Heart size is normal. Upper mediastinal contours are within normal limits. IMPRESSION: 1. The appearance the chest is strongly suggestive of chronic interstitial lung disease. This could be better characterized with follow-up nonemergent high-resolution chest CT. No definite acute findings. Electronically Signed   By: Vinnie Langton M.D.   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 EXAM: CHEST - 2 VIEW COMPARISON:  Chest x-ray 06/18/2021. FINDINGS: Lung volumes are low. Diffuse interstitial prominence throughout the lungs bilaterally with no discernible craniocaudal gradient. No  pleural effusions. No pneumothorax. No cephalization of the pulmonary vasculature. Heart size is normal. Upper mediastinal contours are within normal limits. IMPRESSION: 1. The appearance the chest suggests interstitial lung disease, as above, and could be better characterized with high-resolution chest CT if clinically appropriate. Electronically Signed   By: Vinnie Langton M.D.   On: 07/12/2021 16:16     ASSESSMENT & PLAN Gael Londo Nafziger 65 y.o. male with medical history significant for IgG Kappa multiple myeloma who presents for a follow up visit.   After review of the labs, review of the records, and discussion with the patient the findings are most consistent with an IgG kappa multiple myeloma.  The patient has 2 lytic lesions within the pelvic bones (more noted in spine on CT scan) as well as 10% plasma cells within the bone marrow biopsy.  This combined with his serological findings confirm the diagnosis of multiple myeloma.  The initial treatment of choice for this patient's multiple myeloma was VRd. This will consist of bortezomib 1.35m/m2 on days 1, 8, 15, dexamethasone 463mon days 1,8, and 15, and revlimid 2575mO daily days 1-14. Cycles will consist of 21 days. We will use this regimen to stabilize the patient's myeloma, then make a referral to a BMT center of his choosing for consideration of a bone marrow transplant once he has  been noted to have a good response (VGPR or better). Zometa was started after dental clearance was obtained. This will be continued x 2 years.   Unfortunately had a drug reaction to [name] and was admitted to the hospital from 06/15/2021 until 06/18/2021.  The patient underwent a B AL which clearly showed evidence of eosinophils.  This is consistent with a drug reaction most likely caused by the patient's [name].  Discussed the case with pulmonology who recommended that we not rechallenge for attempt other drugs in the same class such as pomalidomide.  Given the  patient's excellent response to [facility] therapy might preference would be to continue that.  As such I would recommend we proceed with CyBorD chemotherapy.  Daratumumab-based therapy could have been considered, but is often paired with an immunologic.  Additionally I would like to preserve those for additional lines of therapy if necessary.  Therefore we will proceed with CyBorD chemotherapy have the patient referred to transplant.  R-IPSS score: Stage 2. PFS of 42 months  # IgG Kappa Multiple Myeloma (t11;14, standard risk)  --diagnostic criteria was met with monoclonal plasma cells in the bone marrow and lytic lesions on the bone --recommend proceeding with VRd chemotherapy as noted above --patient is young and healthy enough for consideration of BMT, though he is borderline with his age. Will consider referral pending response to treatment.  -- Cycle 1 Day 1 started on 03/16/2021 --decreased dexamethasone to 32m PO weekly due to fluid overload. Also started lasix 216mPO (changed on 06/01/2021) --plan to HOLD revlimid moving forward after evidence of drug reaction in the lungs. --started CyBorD chemotherapy on 06/29/2021 --patient has reached a VGPR. [facility]  Plan: --will HOLD chemotherapy at this time, allowing his respiratory status to recuperate and pulmonology to help optimize his breathing prior to rechallenging him --patient has reached a VGPR. [facility]  --RTC 2 weeks to readdress.   #Drug Reaction in the Lung --confirmed with increased eosinophils on BAL during admission for shortness of breath --currently on a steroid taper, recently dose increased and restarted.  --will need to hold revlimid and and avoid the use of pomalidomide moving forward. Do no recommend rechallenge --patient to follow up with pulmonology.   #Supportive Care -- chemotherapy education complete --zometa therapy started on 04/05/2021 (4  mg IV q 3 months), next dose Dec 2022 --ASA 8125mO daily for thromboprophylaxis on revlimid -- zofran 8mg74mH PRN and compazine 10mg79mq6H for nausea -- acyclovir 400mg 58mID for VCZ prophylaxis -- no pain medication required at this time.   No orders of the defined types were placed in this encounter.   All questions were answered. The patient knows to call the clinic with any problems, questions or concerns.  A total of more than 30 minutes were spent on this encounter with face-to-face time and non-face-to-face time, including preparing to see the patient, ordering tests and/or medications, counseling the patient and coordination of care as outlined above.   Nyzier Boivin TLedell Peoplesepartment of Hematology/Oncology [facility]: [phone]: [phone]: [email or web address]  07/22/2021 5:37 PM

## 2021-07-26 ENCOUNTER — Other Ambulatory Visit: Payer: Self-pay

## 2021-07-26 ENCOUNTER — Encounter: Payer: Self-pay | Admitting: Primary Care

## 2021-07-26 ENCOUNTER — Ambulatory Visit (INDEPENDENT_AMBULATORY_CARE_PROVIDER_SITE_OTHER): Payer: Managed Care, Other (non HMO) | Admitting: Primary Care

## 2021-07-26 DIAGNOSIS — J45909 Unspecified asthma, uncomplicated: Secondary | ICD-10-CM

## 2021-07-26 DIAGNOSIS — J189 Pneumonia, unspecified organism: Secondary | ICD-10-CM | POA: Diagnosis not present

## 2021-07-26 MED ORDER — ADVAIR HFA 230-21 MCG/ACT IN AERO: 2.0000 | INHALATION_SPRAY | Freq: Two times a day (BID) | RESPIRATORY_TRACT | 12 refills | Status: DC

## 2021-07-26 NOTE — Assessment & Plan Note (Addendum)
-   PFTs with methacholine challenge in 2013 were consistent with asthma. Patient stopped taking BREO d/t throat irritation, likely due to dry poweder. I feel he needs to be on a maintenance ICS/LABA inhaler based on his clinical symptoms and wheezing heard on exam.  Recommend patient start Advair 230-21 two puffs twice daily (with spacer).

## 2021-07-26 NOTE — Progress Notes (Signed)
_0  ID: Samuel [name], male    DOB: [birth year], 65 y.o.   MRN: [id number]  Chief Complaint  Patient presents with   Follow-up    Had a rough weekend, 2 days light exercise was sob slightly better, until today anxious, chest tightness,cough,     Referring provider: Antony Contras, MD  HPI: 65 year old male, former smoker. PMH significant for asthma, ILD, allergic rhinitis.  Patient Samuel [name], last seen in office by pulmonary nurse practitioner on 07/20/2021 for dyspnea on exertion.  Previous [facility] pulmonary: 07/20/2021  - Visit, NP [name] 65 year old male former smoker followed in our office for shortness of breath and interstitial lung disease.  Established with Samuel [name].  Initially consulted when inpatient with our team in August/2022.  Chief complaint at that point time was dyspnea.  He was diagnosed with multiple myeloma in the spring 2022 when a bruise was discovered while he was recovering from a sports massage.  He went for further work-up and he was found to have abnormal lab work this led to a bone marrow biopsy confirming diagnosis of multiple myeloma.  He has been followed by Dr. [name].  He was in the middle of treating him with VRD chemotherapy since May/20 04/2021.  He was in the middle of his fifth cycle.  Patient also had [place] in April/2022.  Chest CT in April/2022 showed ill-defined interstitial changes in the periphery.  Concern for [name] lung toxicity.  Patient also had a bronchoscopy performed in 06/18/2021.  Eosinophils 51%, lymphs 18%, no malignant cells identified patient was started on a long-term prednisone taper.  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  Still currently having significant symptoms of shortness of breath.    Last seen in our office on 07/12/2021 by Samuel [name].  Plan of care at that office visit was as follows: Stop [name], check chest x-ray, check CBC with differential, blood ESR, blood IgE,  vitamin D, hemoglobin A1c, G6PD, walk test, check Ono at home, change prednisone to 50 mg daily for 2 weeks, then 40 mg daily for 2 weeks, then 30 mg daily for 2 weeks, then 20 mg daily for 2 weeks, 2-week follow-up with APP or Samuel [name], 4-week follow-up with Samuel [name] and 30-minute time slot.  Per chart review on 07/13/2021 [name] was discontinued.  Patient was also seen by Dr. [name] on 07/18/2021 I am unable to view this note.  Patient reporting that weight team would like for him to resume [name] at a maintenance dose of 10 mg.  This is the current plan.  He will see oncology next Friday for blood work.  Patient is scheduled to complete an overnight oximetry test next Tuesday.  He remains adherent to his prednisone taper 50 mg daily.  Patient reports that this weekend he did play 9 holes of golf on Saturday and on Sunday.  He was limited by his physical exertion.  He also exercised on Tuesday and worked out with a Clinical research associate.  Patient is eager to get back to baseline physical activity.  Patient reports that on Wednesday (07/18/2021) of this week he had increased shortness of breath, cough, worsened acid reflux and he vomited.  Patient believes that this may also be due to the fact that he ate a dinner meal quite quickly.  This sometimes happens when he does this.  He also reports that his blood pressure was high at that time.  Patient reports that he has been off the [name] for  at least 1 month.  Patient has stopped his allopurinol as of 07/18/2021.  Patient and spouse are both frustrated regarding dyspnea and have hopes that he would be improving quicker.  There are also concerns that he may have acute worsened symptoms or an acute infection such as bronchitis.  We will discuss and evaluate for this today.  Walk today in office: 07/20/2021-completed 2 laps on room air, dropped to 93%  07/26/2021- Interim hx  Patient presents today for 1 week follow-up. 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 BAL showed significant eosinophilia. Dr. [name] lowered dose of [name] to 70m daily. CXR on 07/20/21 showed chronic ILD, no definite acute findings. Ambulatory walk during his last visit showed no oxygen desaturations. During his last visit he was ordered for HRCT, PFTs and ONO.   Accompanied by his wife. He had a bad weekend, his respiratory symptoms have been slightly better the last two days. He is currently off BOTH [name] and [name] (may retry [name] at lower dose). He stopped using BREO d/t throat irritation. Xanax has helped relieves some anxiety and chest tightness. Wife reports that he is sleeping better. He in on prolonged prednisone taper. He will be starting 426mprednisone tomorrow  x 2 weeks. He is taking Singulair and generic fluticasone nasal spray. He has been off allergy shots since August. HRCT and PFTs are scheduled for next week. Awaiting results to be faxed for ONO from [facility].    Allergies  Allergen Reactions   Clarithromycin    Penicillins     Immunization History  Administered Date(s) Administered   Influenza Split 10/27/2017   Influenza, High Dose Seasonal PF 12/01/2017, 11/29/2019, 12/19/2020   Influenza, Quadrivalent, Recombinant, Inj, Pf 07/28/2019, 08/11/2020   Influenza-Unspecified 11/26/2011, 07/21/2017   PFIZER(Purple Top)SARS-COV-2 Vaccination 12/27/2019, 01/24/2020, 09/21/2020   Tdap 08/20/2005, 09/07/2015   Zoster, Live 11/06/2017, 05/07/2018    Past Medical History:  Diagnosis Date   Asthma    High cholesterol    under control.    Perennial allergic rhinitis    Sciatic pain, right    Seasonal allergic rhinitis    Thyroid disease     Tobacco History: Social History   Tobacco Use  Smoking Status Former   Packs/day: 0.10   Years: 3.00   Pack years: 0.30   Types: Cigarettes   Quit date: 10/21/1982   Years since quitting: 38.7  Smokeless Tobacco Never    Counseling given: Not Answered   Outpatient Medications Prior to Visit  Medication Sig Dispense Refill   acyclovir (ZOVIRAX) 400 MG tablet Take 1 tablet (400 mg total) by mouth 2 (two) times daily. 60 tablet 3   allopurinol (ZYLOPRIM) 300 MG tablet Take 300 mg by mouth daily.     ALPRAZolam (XANAX) 1 MG tablet Take 1 mg by mouth 2 (two) times daily as needed for anxiety.     aspirin EC 81 MG tablet Take 81 mg by mouth daily. Swallow whole.     ASSESS FULL RANGE PEAK METER DEVI as directed.     cholecalciferol (VITAMIN D) 1000 UNITS tablet Take 3,000 Units by mouth daily.     EPINEPHrine (EPI-PEN) 0.3 mg/0.3 mL DEVI Inject 0.3 mg into the muscle once as needed.     ezetimibe-simvastatin (VYTORIN) 10-40 MG per tablet Take 1 tablet by mouth at bedtime.     furosemide (LASIX) 20 MG tablet Take 1 tablet (20 mg total) by mouth  as needed for fluid or edema. 14 tablet 1   lenalidomide ([name]) 10 MG capsule Take 1 capsule (10 mg total) by mouth daily. [employer] Auth # [id number]   Date Obtained 07/19/21  Take 1 capsule daily x 14 days, none for 7 days 14 capsule 0   levothyroxine (SYNTHROID) 150 MCG tablet Take 150 mcg by mouth every other day. Alternates with 175 mcg     levothyroxine (SYNTHROID) 175 MCG tablet Take 1 tablet by mouth every other day.     levothyroxine (SYNTHROID) 50 MCG tablet Take by mouth.     montelukast (SINGULAIR) 10 MG tablet Take 10 mg by mouth at bedtime.     Multiple Vitamin (MULTIVITAMIN) tablet Take 1 tablet by mouth daily.     omeprazole (PRILOSEC) 40 MG capsule Take 1 capsule by mouth daily.     ondansetron (ZOFRAN) 8 MG tablet Take 1 tablet (8 mg total) by mouth every 8 (eight) hours as needed for nausea or vomiting. 30 tablet 0   predniSONE (DELTASONE) 10 MG tablet Take 5 tablets (50 mg total) by mouth daily with breakfast for 14 days, THEN 4 tablets (40 mg total) daily with breakfast for 14 days, THEN 3 tablets (30 mg total) daily with breakfast for 14 days, THEN 2  tablets (20 mg total) daily with breakfast for 14 days. Once you reach 17m, remain on [id number] further notice.. 196 tablet 0   prochlorperazine (COMPAZINE) 10 MG tablet Take 1 tablet (10 mg total) by mouth every 6 (six) hours as needed for nausea or vomiting. 30 tablet 0   sildenafil (REVATIO) 20 MG tablet 2-5 tablets one hour prior to sexual intimacy     tamsulosin (FLOMAX) 0.4 MG CAPS capsule Take 0.4 mg by mouth daily.     triamcinolone cream (KENALOG) 0.1 % Apply topically.     albuterol (VENTOLIN HFA) 108 (90 Base) MCG/ACT inhaler Inhale 2 puffs into the lungs every 6 (six) hours as needed for wheezing or shortness of breath. (Patient not taking: Reported on 07/26/2021) 8 g 1   predniSONE (DELTASONE) 10 MG tablet Take 5 tablets (50 mg total) by mouth daily with breakfast for 7 days, THEN 4 tablets (40 mg total) daily with breakfast for 7 days, THEN 3 tablets (30 mg total) daily with breakfast for 7 days, THEN 2 tablets (20 mg total) daily with breakfast for 7 days, THEN 1 tablet (10 mg total) daily with breakfast for 7 days, THEN 0.5 tablets (5 mg total) daily with breakfast for 7 days. 108.5 tablet 0   BREO ELLIPTA 200-25 MCG/INH AEPB Inhale 1 puff into the lungs daily. (Patient not taking: Reported on 07/26/2021)     No facility-administered medications prior to visit.    Review of Systems  Review of Systems  Constitutional: Negative.   HENT:  Positive for postnasal drip.   Respiratory:  Positive for cough, shortness of breath and wheezing.     Physical Exam  BP (!) 142/82 (BP Location: Left Arm, Cuff Size: Normal)   Pulse 61   Temp 97.8 F (36.6 C) (Temporal)   Ht _0  (1.778 m)   Wt 210 lb 9.6 oz (95.5 kg)   SpO2 99%   BMI 30.22 kg/m  Physical Exam Constitutional:      Appearance: Normal appearance.  HENT:     Head: Normocephalic and atraumatic.     Nose: Congestion present.  Cardiovascular:     Rate and Rhythm: Normal rate and regular rhythm.  Pulmonary:  Breath sounds: Wheezing present.  Skin:    General: Skin is warm and dry.  Neurological:     General: No focal deficit present.     Mental Status: He is alert and oriented to person, place, and time. Mental status is at baseline.  Psychiatric:        Mood and Affect: Mood normal.        Behavior: Behavior normal.        Thought Content: Thought content normal.        Judgment: Judgment normal.     Lab Results:  CBC    Component Value Date/Time   WBC 9.8 07/18/2021 1042   WBC 9.3 07/12/2021 0954   RBC 4.13 (L) 07/18/2021 1042   HGB 13.2 07/18/2021 1042   HCT 38.2 (L) 07/18/2021 1042   PLT 271 07/18/2021 1042   MCV 92.5 07/18/2021 1042   MCH 32.0 07/18/2021 1042   MCHC 34.6 07/18/2021 1042   RDW 16.8 (H) 07/18/2021 1042   LYMPHSABS 1.8 07/18/2021 1042   MONOABS 0.7 07/18/2021 1042   EOSABS 0.0 07/18/2021 1042   BASOSABS 0.0 07/18/2021 1042    BMET    Component Value Date/Time   NA 129 (L) 07/18/2021 1042   K 4.1 07/18/2021 1042   CL 95 (L) 07/18/2021 1042   CO2 24 07/18/2021 1042   GLUCOSE 79 07/18/2021 1042   BUN 25 (H) 07/18/2021 1042   CREATININE 0.85 07/18/2021 1042   CALCIUM 8.5 (L) 07/18/2021 1042   GFRNONAA >60 07/18/2021 1042    BNP    Component Value Date/Time   BNP 32.3 06/15/2021 1416    ProBNP    Component Value Date/Time   PROBNP 47 07/20/2021 1123    Imaging: DG Chest 2 View  Result Date: 07/20/2021 CLINICAL DATA:  65 year old male with history of dyspnea. EXAM: CHEST - 2 VIEW COMPARISON:  Chest x-ray 07/12/2021. FINDINGS: Lung volumes are low. Diffuse interstitial prominence scattered throughout the lungs bilaterally. Diffuse peribronchial cuffing. No confluent consolidative airspace disease. No pleural effusions. No evidence of pulmonary edema. Heart size is normal. Upper mediastinal contours are within normal limits. IMPRESSION: 1. The appearance the chest is strongly suggestive of chronic interstitial lung disease. This could be better  characterized with follow-up nonemergent high-resolution chest CT. No definite acute findings. Electronically Signed   By: Vinnie Langton M.D.   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 EXAM: CHEST - 2 VIEW COMPARISON:  Chest x-ray 06/18/2021. FINDINGS: Lung volumes are low. Diffuse interstitial prominence throughout the lungs bilaterally with no discernible craniocaudal gradient. No pleural effusions. No pneumothorax. No cephalization of the pulmonary vasculature. Heart size is normal. Upper mediastinal contours are within normal limits. IMPRESSION: 1. The appearance the chest suggests interstitial lung disease, as above, and could be better characterized with high-resolution chest CT if clinically appropriate. Electronically Signed   By: Vinnie Langton M.D.   On: 07/12/2021 16:16     Assessment & Plan:   Pneumonitis - Felt to be related to [name]. BAL showed significant eosinophilia. He is currently off BOTH [name] and [name] and is on prolonged high dose prednisone taper. He has not seen a significant improvement in his breathing yet. He has HRCT and PFTs scheduled for next week. No change to plan. He will follow-up with Samuel [name] on 10/20.   Asthma - PFTs with methacholine challenge in 2013 were consistent with asthma.  Patient stopped taking BREO d/t throat irritation, likely due to dry poweder. I feel he needs to be on a maintenance ICS/LABA inhaler based on his clinical symptoms and wheezing heard on exam.  Recommend patient start Advair 230-21 two puffs twice daily (with spacer).   40 mins spent on case; > 50% face to face   Martyn Ehrich, NP 07/26/2021

## 2021-07-26 NOTE — Assessment & Plan Note (Signed)
-   Felt to be related to [name]. BAL showed significant eosinophilia. He is currently off BOTH [name] and [name] and is on prolonged high dose prednisone taper. He has not seen a significant improvement in his breathing yet. He has HRCT and PFTs scheduled for next week. No change to plan. He will follow-up with Dr. [name] on 10/20.

## 2021-07-26 NOTE — Patient Instructions (Addendum)
We will call and send Mychart message with ONO results once I get them from [facility]   Recommendations: Start Advair - take two puffs morning and evening (use with spacer and rinse mouth after) Continue prednisone taper as directed Continue Singulair and fluticasone nasal spray  Upcoming testing: - CT chest on Monday 10/10 @ 11am  - Pulmonary function testing on Thursday 10/13 @ 9am   Follow-up: - 10/20 at 2:30 with Dr. [name]

## 2021-07-27 ENCOUNTER — Other Ambulatory Visit: Payer: Managed Care, Other (non HMO)

## 2021-07-27 ENCOUNTER — Ambulatory Visit: Payer: Managed Care, Other (non HMO)

## 2021-07-27 ENCOUNTER — Telehealth: Payer: Self-pay | Admitting: Internal Medicine

## 2021-07-27 NOTE — Telephone Encounter (Signed)
  ONO RA 07/24/21 - 45 min spent <88%. 7+ hours was > 90%. OVerall not bad  Plan  Start 2L [place] [facility]

## 2021-07-30 ENCOUNTER — Ambulatory Visit (INDEPENDENT_AMBULATORY_CARE_PROVIDER_SITE_OTHER)
Admission: RE | Admit: 2021-07-30 | Discharge: 2021-07-30 | Disposition: A | Discharge status: Home / Self Care | Payer: Managed Care, Other (non HMO) | Source: Ambulatory Visit | Attending: Pulmonary Disease | Admitting: Pulmonary Disease

## 2021-07-30 ENCOUNTER — Other Ambulatory Visit: Payer: Self-pay

## 2021-07-30 DIAGNOSIS — R0602 Shortness of breath: Secondary | ICD-10-CM

## 2021-07-30 DIAGNOSIS — J849 Interstitial pulmonary disease, unspecified: Secondary | ICD-10-CM | POA: Diagnosis not present

## 2021-07-30 NOTE — Telephone Encounter (Signed)
Called and spoke with pt letting him know the results of the ONO and pt said he wanted to wait to discuss this further at upcoming [place] before order is placed for O2. Nothing further needed.

## 2021-08-01 ENCOUNTER — Telehealth: Payer: Self-pay | Admitting: *Deleted

## 2021-08-01 ENCOUNTER — Telehealth: Payer: Self-pay | Admitting: Internal Medicine

## 2021-08-01 NOTE — Telephone Encounter (Signed)
MR please advise of CT results. BM not in clinic today. Thanks

## 2021-08-01 NOTE — Telephone Encounter (Signed)
Patient called with update. His [name] 10 mg is due to arrive next Tuesday. He has completed his CT scan and is waiting on results. He is scheduled for a PFT tomorrow and hopes to have results before he sees DR. Dorsey on Friday.  Call information forwarded to MD and nurse support pod

## 2021-08-02 ENCOUNTER — Ambulatory Visit (INDEPENDENT_AMBULATORY_CARE_PROVIDER_SITE_OTHER): Payer: Managed Care, Other (non HMO) | Admitting: Internal Medicine

## 2021-08-02 ENCOUNTER — Other Ambulatory Visit: Payer: Self-pay

## 2021-08-02 ENCOUNTER — Encounter: Payer: Self-pay | Admitting: Hematology and Oncology

## 2021-08-02 DIAGNOSIS — T50905A Adverse effect of unspecified drugs, medicaments and biological substances, initial encounter: Secondary | ICD-10-CM

## 2021-08-02 DIAGNOSIS — R918 Other nonspecific abnormal finding of lung field: Secondary | ICD-10-CM

## 2021-08-02 DIAGNOSIS — J849 Interstitial pulmonary disease, unspecified: Secondary | ICD-10-CM

## 2021-08-02 DIAGNOSIS — R9389 Abnormal findings on diagnostic imaging of other specified body structures: Secondary | ICD-10-CM

## 2021-08-02 DIAGNOSIS — J189 Pneumonia, unspecified organism: Secondary | ICD-10-CM | POA: Diagnosis not present

## 2021-08-02 LAB — PULMONARY FUNCTION TEST
DL/VA % pred: 118 %
DL/VA: 4.91 ml/min/mmHg/L
DLCO cor % pred: 46 %
DLCO cor: 12.52 ml/min/mmHg
DLCO unc % pred: 44 %
DLCO unc: 12 ml/min/mmHg
FEF 25-75 Post: 0.99 L/sec
FEF 25-75 Pre: 1.28 L/sec
FEF2575-%Change-Post: -22 %
FEF2575-%Pred-Post: 36 %
FEF2575-%Pred-Pre: 47 %
FEV1-%Change-Post: -6 %
FEV1-%Pred-Post: 34 %
FEV1-%Pred-Pre: 37 %
FEV1-Post: 1.2 L
FEV1-Pre: 1.28 L
FEV1FVC-%Change-Post: 0 %
FEV1FVC-%Pred-Pre: 110 %
FEV6-%Change-Post: -7 %
FEV6-%Pred-Post: 32 %
FEV6-%Pred-Pre: 35 %
FEV6-Post: 1.43 L
FEV6-Pre: 1.55 L
FEV6FVC-%Pred-Post: 105 %
FEV6FVC-%Pred-Pre: 105 %
FVC-%Change-Post: -6 %
FVC-%Pred-Post: 31 %
FVC-%Pred-Pre: 33 %
FVC-Post: 1.45 L
FVC-Pre: 1.55 L
Post FEV1/FVC ratio: 83 %
Post FEV6/FVC ratio: 100 %
Pre FEV1/FVC ratio: 83 %
Pre FEV6/FVC Ratio: 100 %
RV % pred: 95 %
RV: 2.24 L
TLC % pred: 59 %
TLC: 4.18 L

## 2021-08-02 NOTE — Progress Notes (Signed)
IOV 10/04/2011  65 year old male. Allergies and possible asthma. Hypothyroidism. Anxiety state but no formal diagnosis. Has history of claustrophobia as well Non-smoker. Music therapist. Dad with CAD - s/p CABG at age 1 (now 38y)  Referred by Dr Donneta Romberg. Reports dyspnea. INsidious onset "all my life". Increased after starting allergy shots in June 2012. Says during hikes after he gets going it gets better. Same in gym. Notices more when he is starting out or stationary at rest. HE is not sure what it is.  Feels he needs to take a deep breath on occasion. So, now referred to cardiology and pulmonary. Says ICS Qvar in august/sept 2012 made it worse. Outside chart mentions asthma but patient says not sure if he has asthma. But reports childhood hx of asthma diagnosed by a pediatrician at age 1. Says he had similar symptoms.  Up until 1990 used to 10K but even back then had similar symptoms and would need to warm up before feeling better. Then stopped running due to neck problems. Similar during swimming exercise in 1996-1997. Quit swimming 1999. Episodes associated with wheezing but no cough. Unclear if albuterol helps but on xopenex prn esp before walking. This whole thing feels like he is not getting "enough juice". Never had PFT but recollects normal spirometry at Dr Rush [place] office.  He has seen Dr Gwendel Hanson cardiology for above - treadmill and echo and carotid doppler all pending. Current pulmonary modulating drug is Singulair only  Chest x-ray 07/16/2011 is clear per my personal review.  xxxxxxxxxxxxxxxxxxxxxxxxxxxxxxxxxxxxxxxxxxxxxxxxxxxxxxxxxxxxxxxxxx  Inpatient consult 06/16/21 65 y/o male with a history of multiple myeloma diagnosed in 2022, currently undergoing treatment presented to the [facility] emergency department from his hematology oncology appointment with a chief complaint of dyspnea.  The patient smoked cigarettes briefly as a young adult but since then has lived a relatively healthy  lifestyle staying active with exercise and playing golf.  He was diagnosed with multiple myeloma in the spring 2022 when a bruise was discovered while he was receiving a sports massage.  He went for further work-up and was found to have abnormal lab work, this led to a bone marrow biopsy confirming diagnosis of multiple myeloma.  He has been followed by Dr. [name] who has been treating him with VRd chemotherapy since Mar 16, 2021.  He is currently in the middle of his fifth cycle.     Over the last several weeks he has been developing progressive shortness of breath with exertion.  He says this has been going on for about 3 to 4 weeks and has significantly worsened in the last week.  This is associated with a dry, rare cough.  He denies fevers, chills, leg swelling, weight gain, or mucus production.  In the last several days (just prior to admission) [place] and [place] felt significant shortness of breath so he presented to Dr. Libby Maw office for further evaluation.  He was sent to the emergency room for evaluation further because of the severity of his shortness of breath.  In the emergency department he was noted to have hypoxemia to an O2 saturation of 87% and an abnormal chest exam.  Pulmonary and critical care medicine was consulted for further evaluation.  He says that throughout his adult life he has had what he calls "huffing" when he talks while exerting himself or with exertion.  He says he has been followed by an allergist and has been treated for asthma at times over the  years.  He does not regularly use an inhaler and he says this is not something that he routinely needs.  He was seen by my partner Dr. [name] at 1 point in 2013 during which time he had a normal chest x-ray and normal lung function testing.   He had COVID in April 2022.   June 15, 2021 admission August 26 CT chest images independently reviewed: No pulmonary embolism, diffuse interlobular septal  thickening, pleural nodularity noted, areas of groundglass opacification with some patchy distribution particularly in the lung base, no significant pleural effusion, mild reactive appearing lymphadenopathy in the mediastinum Acute respiratory failure with hypoxemia in the setting of diffuse parenchymal lung disease of undetermined etiology: This is somewhat of a complex case given his underlying asthma and the fact that he had COVID in 2022 and in April 2022 a CT scan of his abdomen showed some ill-defined interstitial changes in the periphery of his lungs on lung windows in the bases.  Based on the time course of his illness and reports in the literature I am most concerned about [name] lung toxicity (see citation below), though it is also important to evaluate for another underlying and progressive primary pulmonary ILD or less likely an opportunistic infection (history doesn't seem to support this).  [name] can cause eosinophilic pneumonia which we can assess for with a bronchoscopy, but his imaging characteristics aren't typical for this.  It is possible that the non-specific interstitial changes seen on his lungs in April 2022 were related to his recent COVID infection.  I don't think his baseline asthma explains his symptoms, though he did have a high serum eosinophil count a few weeks prior to admission (unclear significance).  Saglam B, Paulita Fujita M, Ornek S, Keske S, Tabak L, Cakar N, Zeren H, Aytekin S, [place], Ferhanoglu B. Bortezomib induced pulmonary toxicity: a case report and review of the literature. Am J Blood Res. 2020 Dec 15;10(6):407-415. PMID: 34196222; PMCID: [id number].  06/18/21 -   Results for GARL, SPEIGNER" (MRN [id number]) as of 07/12/2021 09:13  Ref. Range 06/18/2021 12:57  Monocyte-Macrophage-Serous Fluid Latest Ref Range: 50 - 90 % 5 (L)  Other Cells, Fluid Latest Units: % CORRELATE WITH CYTOLOGY.  Color, Fluid Latest Ref Range: YELLOW  PINK (A)  Total  Nucleated Cell Count, Fluid Latest Ref Range: 0 - 1,000 cu mm 103  Fluid Type-FCT Unknown BRONCHIAL ALVEOLAR LAVAGE  Lymphs, Fluid Latest Units: % 18  Eos, Fluid Latest Units: % 51  Appearance, Fluid Latest Ref Range: CLEAR  HAZY (A)  Neutrophil Count, Fluid Latest Ref Range: 0 - 25 % 26 (H)  FINAL MICROSCOPIC DIAGNOSIS:  - No malignant cells identified  - Benign bronchial cells and pulmonary macrophages   Titrate off solumedrol and start prednisone 50 mg daily. Decrease by 10 mg weekly    OV 07/12/2021  Subjective:  Patient ID: Samuel [name], male , DOB: [birth year] , age 62 y.o. , MRN: [id number] , ADDRESS: [place] [id number] PCP Antony Contras, MD Patient Care Team: Antony Contras, MD as PCP - General (Family Medicine)  This Provider for this visit: Treatment Team:  Attending Provider: Brand Males, MD    07/12/2021 -   Chief Complaint  Patient presents with   Hospitalization Follow-up    Pt states he was doing better after being out of the hospital after being placed on steroids. States about a week after being out, states he started having more problems with SOB and  to today, he has had problems with SOB with exertion.   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 -presents for follow-up from the hospital.  I originally met him 10 years ago and then at that point in time he had dyspnea.  He tells me that after that he was exercising and living a good life.  Then in April 2022 got diagnosed incidentally with myeloma multiple.  Says he was caught early.  Then in May 2022 started on [name] and [name].  He says he was doing well with this up until mid July.  Up until then he was walking 1-1/2 2 miles per day 5 days a week.  Then in late July 2022 started noticing shortness of breath.  He met Dr. [name] his oncologist on May 25, 2021.  By June 01, 2021 he had severe pulmonary  infiltrates.  He says a part of then he was getting his [name] once a week along with some steroids with each dose.  At this point in time the steroid dose was cut down but he progressed and started getting worse.  Then on June 15, 2021 he was in significant respiratory distress and admitted to the hospital.  I reviewed the hospital records.  He was there for 3 days.  He underwent bronchoscopy with BAL there is also 51% eosinophilia.  Cultures are negative malignant cells are negative.  He was seen by my pulmonary colleagues.  Discussion was held with the [name] other [name] was the etiology.  Given the use and failure it looks like his [name] has been held.  He was discharged on 50 mg prednisone with advised to taper it by 10 mg/week.  Currently is on 20 mg prednisone.  He started playing golf again and feeling really good through Labor Day weekend..  Then on June 29, 2021 his [name] was not given.  Up until this point all chemo was held.  He was given Cytoxan, Zomenta and [name].  The very next in June 30, 2021 he started feeling worse.  He feels [name] is the etiology for this.  On July 06, 2021 he communicated this with Dr. [name] and all chemo has been held.  He is being referred to the bone marrow transplant program at [facility] for further evaluation.  At this point in time he says he is much better than when he was in the hospital but definitely not as good as he was prior to getting a rechallenge with [name] on June 29, 2021.  But he notices that he slowly improving.  Walking desaturation test shows that his pulse ox is holding up although he does have a tendency to desaturate.   Did extensive review of the literature.  According to up-to-date [name] is a known etiology for pulm eosinophilia but he got worse after getting rechallenged with [name].  Review of the literature shows that [name] can cause drug-induced pneumonitis and a small fraction of patients.  The  literature is  NO INFOR  as to whether these patients have eosinophilia or not but definitely they seem to be steroid responsive.  This literature is attached below.  In the timeframe also fits in with [name].  He is also noticed to be on allopurinol which rarely can cause hypersensitivity syndrome or dermatitis, hepatitis and eosinophilia - ADDRESS Syndrome but he did not have thse features of rash etc     Nevertheless significant eosinophilia in the BAL 51%  suggest drug-induced pneumonitis.  He currently has significant symptoms of shortness of breath.  He does not have oxygen with him at home.  Simple office walk 185 feet x  3 laps goal with forehead probe 07/12/2021    O2 used ra   Number laps completed 3   Comments about pace slow   Resting Pulse Ox/HR 100% and 69/min   Final Pulse Ox/HR 92% and 81/min   Desaturated </= 88% no   Desaturated <= 3% points Yes 8   Got Tachycardic >/= 90/min no   Symptoms at end of test No complaints   Miscellaneous comments x        Causes of Pulmonary Eosinophilia: from UPTODATE A. Nonsteroidal antiinflammatory drugs (NSAIDs) B. Nitrofurantoin  C. Minocycline D. Sulfonamides/ Sulfamethoxazole E. Ampicillin F. Daptomycin G.Vancomycin H. Dapsone I. Beta-lactam antibiotics J. Nevirapine K. Telaprevir L. Sulfasalazine M. Methotrexate N. Mesalamine O. Amiodarone P. Bleomycin Q. ACE Inhibitor R. Beta blocker S. Hydrochlorothiazides T. L- Tryptophan U. Allopurinol V. Carbamazepine W. Lamotrigine X. Phenytoin Y. Phenindione Z. Fluindione AA. Olanzapine AB. Oxcarbazepine AC. Strontium Ranelate AD. Lenalidomide AE. Radiographic contrast media  Saglam B, Kalyon H, Ozbalak M, Ornek S, Keske S, Tabak L, Cakar N, Zeren H, Aytekin S, [place], [place] B. Bortezomib induced pulmonary toxicity: a case report and review of the literature. Am J Blood Res. 2020 Dec 15;10(6):407-415. PMID: 35329924; PMCID: [id number]. - Bortezomib  related pulmonary toxicities are rarely reported. Although the incidence of Bortezomib induced lung injury (BLI) is unknown, in a large registry study of 1010 MM patients, 45 patients were reported to have BLI by their physician. However, the causality could only be constituted in 26 patients (2.6%), with 5 of them resulting in death despite steroid treatment.In the case reports, the average number of RVD cycles until the toxicity was presented was 6.9, and the period between the development of pulmonary toxicity and the first dose of Bortezomib was 31.1 days -No mention of eosinophilia  Bortezomib therapy-related lung disease in [place] patients with multiple myeloma: Incidence, mortality and clinical characterization Dyann Ruddle [facility] Miyao,2 [place] Masahiko Kusumoto,5 [place] Fumikazu [facility],9 [place] Sugiyama,8 [place] Hatake,9 [place] Pao Fukuda,10 and [place] patients registered, 102 (4.5%) developed BILD, 5 (0.50%) of whom had fatal cases. The median time to BILD onset from the first bortezomib dose was 14.5 days, and most of the patients responded well to corticosteroid therapy. A retrospective review by the Lung Injury Medical Expert Panel revealed that the types with capillary leak syndrome and hypoxia without infiltrative shadows were uniquely and frequently observed in patients with BIL - no mention of eosinophilia  CellularOperator.fi =- allopurinol complex multisystem Hypersensitivityey   has a past medical history of Asthma, High cholesterol, Perennial allergic rhinitis, Sciatic pain, right, Seasonal allergic rhinitis, and Thyroid disease.   reports that he quit smoki     07/20/2021  - Visit, NP [name]    65 year old male former smoker followed in our office for shortness of breath and interstitial lung disease.  Established with Dr. [name].  Initially  consulted when inpatient with our team in August/2022.  Chief complaint at that point time was dyspnea.  He was diagnosed with multiple myeloma in the spring 2022 when a bruise was discovered while he was recovering from a sports massage.  He went for further work-up and he was found to have abnormal lab work this led to a bone marrow biopsy confirming  diagnosis of multiple myeloma.  He has been followed by Dr. [name].  He was in the middle of treating him with VRD chemotherapy since May/20 04/2021.  He was in the middle of his fifth cycle.  Patient also had [place] in April/2022.  Chest CT in April/2022 showed ill-defined interstitial changes in the periphery.  Concern for [name] lung toxicity.  Patient also had a bronchoscopy performed in 06/18/2021.  Eosinophils 51%, lymphs 18%, no malignant cells identified patient was started on a long-term prednisone taper.  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  Still currently having significant symptoms of shortness of breath.    Last seen in our office on 07/12/2021 by Dr. [name].  Plan of care at that office visit was as follows: Stop [name], check chest x-ray, check CBC with differential, blood ESR, blood IgE, vitamin D, hemoglobin A1c, G6PD, walk test, check Ono at home, change prednisone to 50 mg daily for 2 weeks, then 40 mg daily for 2 weeks, then 30 mg daily for 2 weeks, then 20 mg daily for 2 weeks, 2-week follow-up with APP or Dr. [name], 4-week follow-up with Dr. [name] and 30-minute time slot.  Per chart review on 07/13/2021 [name] was discontinued.  Patient was also seen by Dr. [name] on 07/18/2021 I am unable to view this note.  Patient reporting that weight team would like for him to resume [name] at a maintenance dose of 10 mg.  This is the current plan.  He will see oncology next Friday for blood work.  Patient is scheduled to complete an overnight oximetry test next Tuesday.  He remains  adherent to his prednisone taper 50 mg daily.  Patient reports that this weekend he did play 9 holes of golf on Saturday and on Sunday.  He was limited by his physical exertion.  He also exercised on Tuesday and worked out with a Clinical research associate.  Patient is eager to get back to baseline physical activity.  Patient reports that on Wednesday (07/18/2021) of this week he had increased shortness of breath, cough, worsened acid reflux and he vomited.  Patient believes that this may also be due to the fact that he ate a dinner meal quite quickly.  This sometimes happens when he does this.  He also reports that his blood pressure was high at that time.  Patient reports that he has been off the [name] for at least 1 month.  Patient has stopped his allopurinol as of 07/18/2021.  Patient and spouse are both frustrated regarding dyspnea and have hopes that he would be improving quicker.  There are also concerns that he may have acute worsened symptoms or an acute infection such as bronchitis.  We will discuss and evaluate for this today.  Walk today in office: 07/20/2021-completed 2 laps on room air, dropped to 93%  07/26/2021- Interim hx  Patient presents today for 1 week follow-up. ILD felt to be related to drug pneumonitis from [name] as well as [name] for his treatment of multiple myeloma. BAL showed significant eosinophilia. Dr. [name] lowered dose of [name] to 29m daily. CXR on 07/20/21 showed chronic ILD, no definite acute findings. Ambulatory walk during his last visit showed no oxygen desaturations. During his last visit he was ordered for HRCT, PFTs and ONO.   Accompanied by his wife. He had a bad weekend, his respiratory symptoms have been slightly better the last two days. He is currently off BOTH [name] and [name] (may retry [name] at lower  dose). He stopped using BREO d/t throat irritation. Xanax has helped relieves some anxiety and chest tightness. Wife reports that he is sleeping better. He in  on prolonged prednisone taper. He will be starting 42m prednisone tomorrow  x 2 weeks. He is taking Singulair and generic fluticasone nasal spray. He has been off allergy shots since August. HRCT and PFTs are scheduled for next week. Awaiting results to be faxed for ONO from [employer].    OV 08/02/2021 -   Subjective:  Patient ID: VBillee [name] male , DOB: 1December 22, 1957, age 65y.o. , MRN: [id number], [id number] SAntony Contras MD Patient Care Team: SAntony Contras MD as PCP - General (Family Medicine)  This Provider for this visit: Treatment Team:  Attending Provider: RBrand Males MD  Type of visit: Telephone/Video Circumstance: COVID-19 national emergency Identification of patient VGONSALO CUTHBERTSONwith 110-27-1957and MRN [id number]- 2 person identifier Risks: Risks, benefits, limitations of telephone visit explained. 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 CT abdomen/pelvis: Multiple small lytic lesions in the thoracolumbar spine and bilateral pelvis -SPEP: IgG 2,082 (H), M Protein 1.8 (H). IFE shows IgG monoclonal protein with kappa light chain specificity.  -LDH 169, CBC normal, CMP normal except for sodium 131 (L), Chloride 96 (L).   02/14/2021: Establish care with IDede QueryPA-C 02/22/2021: bone marrow biopsy confirms the diagnosis of Multiple Myeloma with a monoclonal plasma cell population.  03/16/2021: Cycle 1 Day 1 of VRd chemotherapy  04/06/2021: Cycle 2 Day 1 of VRd chemotherapy  04/27/2021: Cycle 3 Day 1 of VRd chemotherapy  05/18/2021: Cycle 4 Day 1 of VRd chemotherapy  06/01/2021: drop  dexamethasone to 265mPO weekly and start lasix due to shortness of breath.  06/15/2021: Desaturation to 87% on ambulation. HELD velcade today and sent to ED for evaluation.  06/22/2021: Findings consistent with drug reaction the lungs with eosinophils on BAL.  Given these findings we will definitely hold [name] and plan to avoid pomalidomide 06/29/2021: Cycle 1 Day 1 CyBorD chemotherapy   HPI Samuel [name] 89.o. -there is a telephone visit.  He is supposed to see me next week but he had a CT scan of the chest 2 days ago and had pulmonary function test today.  He really wanted to discuss the results today itself.  Review of the records indicate that he still on prednisone taper.  He is able to ambulate and desaturated only after 200 feet.  There are some mild nocturnal desaturation.  We started 2 L of nasal cannula oxygen.  His CT scan of the chest shows diffuse groundglass opacity in a pattern that is inconsistent with UIP [less than 40% chance this is UIP] suggestive of alternate pattern.  He says he is able to do weight training exercises well but when he walks on a treadmill or does ambulation that is when it bothers him.  When he rests he is better.  He does desaturate to 80s percent.  He is wondering if this could be from asthma.  I told him otherwise.  Touch base with Dr. DoLorenso Courieris oncologist.  He is scheduled for cyclophosphamide  tomorrow along with low-dose [name].  This was held off recently in September 2022.  But oncology is wanting to rechallenge him with low-dose [name].  They wanted approval for this.   ONO RA 07/24/21   - - 45 min spent <88%. 7+ hours was > 90%. OVerall not bad  Plan  Start 2L [place] QHS  Results for MOUSA, PROUT" (MRN [id number]) as of 08/02/2021 12:16  Ref. Range 08/02/2021  08/02/2021   FVC-Pre Latest Units: L 1.55   FVC-%Pred-Pre Latest Units: % 33   Results for ANDRE, SWANDER" (MRN [id number]) as of 08/02/2021 12:16  Ref. Range  08/02/2021    DLCO cor Latest Units: ml/min/mmHg 12.52   DLCO cor % pred Latest Units: % 46   Results for JANSEL, VONSTEIN" (MRN [id number]) as of 08/02/2021 12:16  Ref. Range 08/02/2021    TLC Latest Units: L 4.18   TLC % pred Latest Units: % 59    Results for DURIEL, DEERY" (MRN [id number]) as of 08/02/2021 12:16  Ref. Range 07/12/2021 09:54  G-6PDH Latest Ref Range: 7.0 - 20.5 U/g Hgb 14.8   CT Chest data  CT Chest High Resolution  Result Date: 07/31/2021 CLINICAL DATA:  Evaluate for interstitial lung disease EXAM: CT CHEST WITHOUT CONTRAST TECHNIQUE: Multidetector CT imaging of the chest was performed following the standard protocol without intravenous contrast. High resolution imaging of the lungs, as well as inspiratory and expiratory imaging, was performed. COMPARISON:  Chest CT dated June 15, 2021; abdomen and pelvis CT dated February 02, 2021 FINDINGS: Cardiovascular: Cardiomegaly with trace pericardial effusion. Coronary artery calcifications of the RCA and LAD. Atherosclerotic disease of the thoracic aorta. Mediastinum/Nodes: Esophagus is unremarkable. Atrophic thyroid. Mediastinal lymph nodes are decreased in size when compared with prior exam. Reference AP window lymph node on series 2, image 42 measures 1.1 cm in short axis, previously 1.3 cm. Lungs/Pleura: Central airways are patent. Images are motion degraded. Mild diffuse ground-glass opacity with peribronchovascular and subpleural reticular glass opacities and traction bronchiectasis. No clear craniocaudal predominance. Mild bilateral air trapping. 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 Stable solid right middle lobe pulmonary nodule measuring 3 mm on series 3, image 57. Upper Abdomen: No acute abnormality. Musculoskeletal: No chest wall mass or suspicious bone lesions identified. IMPRESSION: Limited evaluation due to respiratory motion artifact. Within limitations, there are diffuse bilateral  ground-glass opacities with peribronchovascular and subpleural reticular opacities, traction bronchiectasis and no clear craniocaudal predominance. Differential considerations include sequela of acute lung injury, NSIP, or fibrotic HP given presence of air trapping. Mild subpleural reticular opacities were present on visualized portions of the lung on prior abdomen and pelvis CT dated February 02, 2021, although majority of findings are new. Findings are suggestive of an alternative diagnosis (not UIP) per consensus guidelines: Diagnosis of Idiopathic Pulmonary Fibrosis: An Official ATS/ERS/JRS/ALAT Clinical Practice Guideline. [place], Iss 5, [phone], Jun 21 2017. Small solid pulmonary nodule the right middle lobe measuring 3 mm. No follow-up needed if patient is low-risk. Non-contrast chest CT can be considered in 12 months if patient is high-risk. This recommendation follows the consensus statement: Guidelines for Management of Incidental Pulmonary Nodules Detected on CT Images: From the Fleischner Society 2017; Radiology 2017; 284:228-243. Aortic Atherosclerosis (ICD10-I70.0). Electronically Signed   By: Yetta Glassman M.D.   On: 07/31/2021 12:01      PFT  PFT Results Latest Ref Rng & Units 06/15/2021  FVC-Pre  L 1.55  FVC-Predicted Pre % 33  FVC-Post L 1.45  FVC-Predicted Post % 31  Pre FEV1/FVC % % 83  Post FEV1/FCV % % 83  FEV1-Pre L 1.28  FEV1-Predicted Pre % 37  FEV1-Post L 1.20  DLCO uncorrected ml/min/mmHg 12.00  DLCO UNC% % 44  DLCO corrected ml/min/mmHg 12.52  DLCO COR %Predicted % 46  DLVA Predicted % 118  TLC L 4.18  TLC % Predicted % 59  RV % Predicted % 95       has a past medical history of Asthma, High cholesterol, Perennial allergic rhinitis, Sciatic pain, right, Seasonal allergic rhinitis, and Thyroid disease.   reports that he quit smoking about 38 years ago. His smoking use included cigarettes. He has a 0.30 pack-year smoking history. He  has never used smokeless tobacco.  Past Surgical History:  Procedure Laterality Date   BRONCHIAL BIOPSY  06/18/2021   Procedure: BRONCHIAL BIOPSIES;  Surgeon: Collene Gobble, MD;  Location: [facility];  Service: Cardiopulmonary;;   BRONCHIAL WASHINGS  06/18/2021   Procedure: BRONCHIAL WASHINGS;  Surgeon: Collene Gobble, MD;  Location: [facility];  Service: Cardiopulmonary;;   NASAL SINUS SURGERY     NECK SURGERY  1996   VIDEO BRONCHOSCOPY N/A 06/18/2021   Procedure: VIDEO BRONCHOSCOPY WITH FLUORO;  Surgeon: Collene Gobble, MD;  Location: [facility];  Service: Cardiopulmonary;  Laterality: N/A;    Allergies  Allergen Reactions   Clarithromycin    Penicillins     Immunization History  Administered Date(s) Administered   Influenza Split 10/27/2017   Influenza, High Dose Seasonal PF 12/01/2017, 11/29/2019, 12/19/2020   Influenza, Quadrivalent, Recombinant, Inj, Pf 07/28/2019, 08/11/2020   Influenza-Unspecified 11/26/2011, 07/21/2017   PFIZER(Purple Top)SARS-COV-2 Vaccination 12/27/2019, 01/24/2020, 09/21/2020   Tdap 08/20/2005, 09/07/2015   Zoster, Live 11/06/2017, 05/07/2018    Family History  Problem Relation Age of Onset   Allergies Father    Allergies Mother    Hypertension Other    Heart disease Other    Breast cancer Paternal Grandmother      Current Outpatient Medications:    acyclovir (ZOVIRAX) 400 MG tablet, Take 1 tablet (400 mg total) by mouth 2 (two) times daily., Disp: 60 tablet, Rfl: 3   albuterol (VENTOLIN HFA) 108 (90 Base) MCG/ACT inhaler, Inhale 2 puffs into the lungs every 6 (six) hours as needed for wheezing or shortness of breath. (Patient not taking: Reported on 07/26/2021), Disp: 8 g, Rfl: 1   allopurinol (ZYLOPRIM) 300 MG tablet, Take 300 mg by mouth daily., Disp: , Rfl:    ALPRAZolam (XANAX) 1 MG tablet, Take 1 mg by mouth 2 (two) times daily as needed for anxiety., Disp: , Rfl:    aspirin EC 81 MG tablet, Take 81 mg by mouth daily. Swallow  whole., Disp: , Rfl:    ASSESS FULL RANGE PEAK METER DEVI, as directed., Disp: , Rfl:    cholecalciferol (VITAMIN D) 1000 UNITS tablet, Take 3,000 Units by mouth daily., Disp: , Rfl:    EPINEPHrine (EPI-PEN) 0.3 mg/0.3 mL DEVI, Inject 0.3 mg into the muscle once as needed., Disp: , Rfl:    ezetimibe-simvastatin (VYTORIN) 10-40 MG per tablet, Take 1 tablet by mouth at bedtime., Disp: , Rfl:    fluticasone-salmeterol (ADVAIR HFA) 230-21 MCG/ACT inhaler, Inhale 2 puffs into the lungs 2 (two) times daily., Disp: 1 each, Rfl: 12   furosemide (LASIX) 20 MG tablet, Take 1 tablet (20 mg total) by mouth as needed for fluid or edema., Disp: 14 tablet,  Rfl: 1   lenalidomide ([name]) 10 MG capsule, Take 1 capsule (10 mg total) by mouth daily. [employer] Auth # [id number]   Date Obtained 07/19/21  Take 1 capsule daily x 14 days, none for 7 days, Disp: 14 capsule, Rfl: 0   levothyroxine (SYNTHROID) 150 MCG tablet, Take 150 mcg by mouth every other day. Alternates with 175 mcg, Disp: , Rfl:    levothyroxine (SYNTHROID) 175 MCG tablet, Take 1 tablet by mouth every other day., Disp: , Rfl:    levothyroxine (SYNTHROID) 50 MCG tablet, Take by mouth., Disp: , Rfl:    montelukast (SINGULAIR) 10 MG tablet, Take 10 mg by mouth at bedtime., Disp: , Rfl:    Multiple Vitamin (MULTIVITAMIN) tablet, Take 1 tablet by mouth daily., Disp: , Rfl:    omeprazole (PRILOSEC) 40 MG capsule, Take 1 capsule by mouth daily., Disp: , Rfl:    ondansetron (ZOFRAN) 8 MG tablet, Take 1 tablet (8 mg total) by mouth every 8 (eight) hours as needed for nausea or vomiting., Disp: 30 tablet, Rfl: 0   predniSONE (DELTASONE) 10 MG tablet, Take 5 tablets (50 mg total) by mouth daily with breakfast for 14 days, THEN 4 tablets (40 mg total) daily with breakfast for 14 days, THEN 3 tablets (30 mg total) daily with breakfast for 14 days, THEN 2 tablets (20 mg total) daily with breakfast for 14 days. Once you reach 17m, remain on [id number] further notice..,  Disp: 196 tablet, Rfl: 0   prochlorperazine (COMPAZINE) 10 MG tablet, Take 1 tablet (10 mg total) by mouth every 6 (six) hours as needed for nausea or vomiting., Disp: 30 tablet, Rfl: 0   sildenafil (REVATIO) 20 MG tablet, 2-5 tablets one hour prior to sexual intimacy, Disp: , Rfl:    tamsulosin (FLOMAX) 0.4 MG CAPS capsule, Take 0.4 mg by mouth daily., Disp: , Rfl:    triamcinolone cream (KENALOG) 0.1 %, Apply topically., Disp: , Rfl:       Objective:   There were no vitals filed for this visit.  Estimated body mass index is 30.22 kg/m as calculated from the following:   Height as of 07/26/21: _0  (1.778 m).   Weight as of 07/26/21: 210 lb 9.6 oz (95.5 kg).  _1 @  There were no vitals filed for this visit.   Physical Exam Soundded normal phoe          Assessment:       ICD-10-CM   1. 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 Ground glass opacity present on imaging of lung  R91.8       Unclear whye is not imprpving and this makes it worrisome that he might be in a refractory state where he might have permanent ILD.  This is despite prednisone.  Alternatively?  He has some form of irreversible ILD [especially if he had previous baseline ILD as suggested by his April 2022 CT scan of the abdomen lung images] and currently flared up from the drugs.  However eosinophilia would not be the pattern for this.?  If he has had any opportunistic infections -again the clinical course does not fit in with that  It is possible that he might need a surgical lung biopsy or at least a repeat bronchoscopy although the quick time course and the persistence of this might make the indication to earlier time point.  The other issues about handling his  myeloma treatment.  His oncologist Dr. Libby Maw expressed desire to rechallenge him with [name] but at a low dose.  Patient wants to know about this.  I honestly do not know  what to tell about this.  [name] is the known agent for pulmonary eosinophilia.  He did get symptomatic only with the rechallenge of [name].  [name] is also known to cause acute lung injury and sometimes rarely can be permanent.  [name] eosinophilia is not reported but the literature did not do a good job reporting the bronchoalveolar cell results from [name] related acute lung injury.  I think Cytoxan would be okay because it is given to treat inflammatory lung disease that not responsive to prednisone.  But I am told this is given with [name].  Evidence of some interstitial lung disease colleagues across the [place] to see what they would suggest  I have told the patient that his lungs are at least 50% or close to diminished.  And currently his lung disease if it does not improve will portend a poor prognosis  -Discussed considering antifibrotic's on an empiric basis to to protect him against chemo agents that could be deleterious to the lung and to protect him against progressive phenotype.  Explained the GI side effects.  He is interested in this option  His wife was present in the entire conversation   will need to touch with Dr. [name] again    Plan:     Patient Instructions  ILD - see you next week.   (Telephone visit - Level 03 visit: Estb 21-30 for this visit type which was visit type: telephone visit in total care time and counseling or/and coordination of care by this undersigned MD - Dr Brand Males. This includes one or more of the following for care delivered on 08/02/2021 same day: pre-charting, chart review, note writing, documentation discussion of test results, diagnostic or treatment recommendations, prognosis, risks and benefits of management options, instructions, education, compliance or risk-factor reduction. It excludes time spent by the [place] or office staff in the care of the patient. Actual time was 45 min. E&M code is [phone])   SIGNATURE    Dr. Brand Males, M.D., F.C.C.P,  Pulmonary and Critical Care Medicine Staff Physician, [place] Director - Interstitial Lung Disease  Program  Pulmonary [facility] [place], [place], [address]  Pager: [phone], If no answer or between  15:00h - 7:00h: call [phone] Telephone: [phone]  3:09 PM 08/02/2021

## 2021-08-02 NOTE — Progress Notes (Signed)
Full PFT performed today. °

## 2021-08-02 NOTE — Telephone Encounter (Signed)
Issue addressed with a phone visit.  Will close encounter.

## 2021-08-02 NOTE — Patient Instructions (Signed)
Full PFT performed today. °

## 2021-08-02 NOTE — Patient Instructions (Addendum)
ILD - see you next week.

## 2021-08-03 ENCOUNTER — Inpatient Hospital Stay (HOSPITAL_BASED_OUTPATIENT_CLINIC_OR_DEPARTMENT_OTHER): Payer: Managed Care, Other (non HMO) | Admitting: Hematology and Oncology

## 2021-08-03 ENCOUNTER — Inpatient Hospital Stay: Payer: Managed Care, Other (non HMO) | Attending: Hematology and Oncology

## 2021-08-03 ENCOUNTER — Inpatient Hospital Stay: Payer: Managed Care, Other (non HMO)

## 2021-08-03 VITALS — BP 157/83 | HR 82 | Temp 97.4°F | Resp 20 | Ht 70.0 in | Wt 210.7 lb

## 2021-08-03 DIAGNOSIS — Z87891 Personal history of nicotine dependence: Secondary | ICD-10-CM | POA: Insufficient documentation

## 2021-08-03 DIAGNOSIS — E7800 Pure hypercholesterolemia, unspecified: Secondary | ICD-10-CM | POA: Diagnosis not present

## 2021-08-03 DIAGNOSIS — J45909 Unspecified asthma, uncomplicated: Secondary | ICD-10-CM | POA: Insufficient documentation

## 2021-08-03 DIAGNOSIS — Z7951 Long term (current) use of inhaled steroids: Secondary | ICD-10-CM | POA: Diagnosis not present

## 2021-08-03 DIAGNOSIS — Z7952 Long term (current) use of systemic steroids: Secondary | ICD-10-CM | POA: Diagnosis not present

## 2021-08-03 DIAGNOSIS — M899 Disorder of bone, unspecified: Secondary | ICD-10-CM | POA: Diagnosis not present

## 2021-08-03 DIAGNOSIS — Z9221 Personal history of antineoplastic chemotherapy: Secondary | ICD-10-CM | POA: Insufficient documentation

## 2021-08-03 DIAGNOSIS — C9000 Multiple myeloma not having achieved remission: Secondary | ICD-10-CM

## 2021-08-03 DIAGNOSIS — Z7982 Long term (current) use of aspirin: Secondary | ICD-10-CM | POA: Diagnosis not present

## 2021-08-03 DIAGNOSIS — E079 Disorder of thyroid, unspecified: Secondary | ICD-10-CM | POA: Diagnosis not present

## 2021-08-03 LAB — CBC WITH DIFFERENTIAL (CANCER CENTER ONLY)
Abs Immature Granulocytes: 0.07 10*3/uL (ref 0.00–0.07)
Basophils Absolute: 0 10*3/uL (ref 0.0–0.1)
Basophils Relative: 0 %
Eosinophils Absolute: 0 10*3/uL (ref 0.0–0.5)
Eosinophils Relative: 0 %
HCT: 39.9 % (ref 39.0–52.0)
Hemoglobin: 14.1 g/dL (ref 13.0–17.0)
Immature Granulocytes: 1 %
Lymphocytes Relative: 11 %
Lymphs Abs: 1.5 10*3/uL (ref 0.7–4.0)
MCH: 32.9 pg (ref 26.0–34.0)
MCHC: 35.3 g/dL (ref 30.0–36.0)
MCV: 93.2 fL (ref 80.0–100.0)
Monocytes Absolute: 0.6 10*3/uL (ref 0.1–1.0)
Monocytes Relative: 5 %
Neutro Abs: 10.9 10*3/uL — ABNORMAL HIGH (ref 1.7–7.7)
Neutrophils Relative %: 83 %
Platelet Count: 191 10*3/uL (ref 150–400)
RBC: 4.28 MIL/uL (ref 4.22–5.81)
RDW: 16.8 % — ABNORMAL HIGH (ref 11.5–15.5)
WBC Count: 13.1 10*3/uL — ABNORMAL HIGH (ref 4.0–10.5)
nRBC: 0 % (ref 0.0–0.2)

## 2021-08-03 LAB — CMP (CANCER CENTER ONLY)
ALT: 29 U/L (ref 0–44)
AST: 23 U/L (ref 15–41)
Albumin: 3.7 g/dL (ref 3.5–5.0)
Alkaline Phosphatase: 57 U/L (ref 38–126)
Anion gap: 6 (ref 5–15)
BUN: 25 mg/dL — ABNORMAL HIGH (ref 8–23)
CO2: 23 mmol/L (ref 22–32)
Calcium: 8.6 mg/dL — ABNORMAL LOW (ref 8.9–10.3)
Chloride: 99 mmol/L (ref 98–111)
Creatinine: 0.89 mg/dL (ref 0.61–1.24)
GFR, Estimated: >60 mL/min (ref >60)
Glucose, Bld: 123 mg/dL — ABNORMAL HIGH (ref 70–99)
Potassium: 3.7 mmol/L (ref 3.5–5.1)
Sodium: 128 mmol/L — ABNORMAL LOW (ref 135–145)
Total Bilirubin: 0.7 mg/dL (ref 0.3–1.2)
Total Protein: 7 g/dL (ref 6.5–8.1)

## 2021-08-03 LAB — LACTATE DEHYDROGENASE: LDH: 335 U/L — ABNORMAL HIGH (ref 98–192)

## 2021-08-03 NOTE — Progress Notes (Signed)
[place] [phone]   [phone]  PROGRESS NOTE  Patient Care Team: Antony Contras, MD as PCP - General (Family Medicine)  Hematological/Oncological History # IgG Kappa Multiple Myeloma 02/02/2021:  Presented to [facility] due to right sided flank tenderness with bruising. CT abdomen/pelvis: Multiple small lytic lesions in the thoracolumbar spine and bilateral pelvis -SPEP: IgG 2,082 (H), M Protein 1.8 (H). IFE shows IgG monoclonal protein with kappa light chain specificity.  -LDH 169, CBC normal, CMP normal except for sodium 131 (L), Chloride 96 (L).   02/14/2021: Establish care with Dede Query PA-C 02/22/2021: bone marrow biopsy confirms the diagnosis of Multiple Myeloma with a monoclonal plasma cell population.  03/16/2021: Cycle 1 Day 1 of VRd chemotherapy  04/06/2021: Cycle 2 Day 1 of VRd chemotherapy  04/27/2021: Cycle 3 Day 1 of VRd chemotherapy  05/18/2021: Cycle 4 Day 1 of VRd chemotherapy  06/01/2021: drop dexamethasone to 21m PO weekly and start lasix due to shortness of breath.  06/15/2021: Desaturation to 87% on ambulation. HELD velcade today and sent to ED for evaluation.  06/22/2021: Findings consistent with drug reaction the lungs with eosinophils on BAL.  Given these findings we will definitely hold [name] and plan to avoid pomalidomide 06/29/2021: Cycle 1 Day 1 CyBorD chemotherapy 07/05/2021: HOLD chemotherapy given worsening lung function  Interval History:  Samuel [name] 65y.o. male with medical history significant for IgG Kappa multiple myeloma who presents for a follow up visit. The patient's last visit was on 07/18/2021.  In the interim since last visit his ILD has failed to improve on steroid therapy.   On exam today [name] is accompanied by his wife.  He notes that he underwent a breathing test with pulmonology was told he only had about 50% lung capacity.  He reports that his symptoms have not improved much since her last  visit.  He notes that walking continues to be troublesome and he has difficulty walking 20 feet are warm.  He continues to be quite short of breath and has been focusing on breathing techniques in order to try to help with this.  He notes when he goes to bed he feels claustrophobic but he is able to lay flat. Given the patient's respiratory status today we decided to hold on further treatments of chemotherapy until his respiratory status improved. He denies any fevers, chills, sweats, nausea, vomiting or diarrhea.  A full 10 point ROS is listed below.  MEDICAL HISTORY:  Past Medical History:  Diagnosis Date   Asthma    High cholesterol    under control.    Perennial allergic rhinitis    Sciatic pain, right    Seasonal allergic rhinitis    Thyroid disease     SURGICAL HISTORY: Past Surgical History:  Procedure Laterality Date   BRONCHIAL BIOPSY  06/18/2021   Procedure: BRONCHIAL BIOPSIES;  Surgeon: BCollene Gobble MD;  Location: [facility];  Service: Cardiopulmonary;;   BRONCHIAL WASHINGS  06/18/2021   Procedure: BRONCHIAL WASHINGS;  Surgeon: BCollene Gobble MD;  Location: [facility];  Service: Cardiopulmonary;;   NASAL SINUS SURGERY     NECK SURGERY  1996   VIDEO BRONCHOSCOPY N/A 06/18/2021   Procedure: VIDEO BRONCHOSCOPY WITH FLUORO;  Surgeon: BCollene Gobble MD;  Location: [facility];  Service: Cardiopulmonary;  Laterality: N/A;    SOCIAL HISTORY: Social History   Socioeconomic History   Marital status: Married    Spouse name: Not on file   Number of  children: Not on file   Years of education: Not on file   Highest education level: Not on file  Occupational History   Not on file  Tobacco Use   Smoking status: Former    Packs/day: 0.10    Years: 3.00    Pack years: 0.30    Types: Cigarettes    Quit date: 10/21/1982    Years since quitting: 38.8   Smokeless tobacco: Never  Vaping Use   Vaping Use: Never used  Substance and Sexual Activity   Alcohol use: Yes     Comment: 1 drink daily   Drug use: No   Sexual activity: Not on file  Other Topics Concern   Not on file  Social History Narrative   Not on file   Social Determinants of Health   Financial Resource Strain: Not on file  Food Insecurity: Not on file  Transportation Needs: Not on file  Physical Activity: Not on file  Stress: Not on file  Social Connections: Not on file  Intimate Partner Violence: Not on file    FAMILY HISTORY: Family History  Problem Relation Age of Onset   Allergies Father    Allergies Mother    Hypertension Other    Heart disease Other    Breast cancer Paternal Grandmother     ALLERGIES:  is allergic to clarithromycin and penicillins.  MEDICATIONS:  Current Outpatient Medications  Medication Sig Dispense Refill   acyclovir (ZOVIRAX) 400 MG tablet Take 1 tablet (400 mg total) by mouth 2 (two) times daily. 60 tablet 3   albuterol (VENTOLIN HFA) 108 (90 Base) MCG/ACT inhaler Inhale 2 puffs into the lungs every 6 (six) hours as needed for wheezing or shortness of breath. (Patient not taking: Reported on 07/26/2021) 8 g 1   allopurinol (ZYLOPRIM) 300 MG tablet Take 300 mg by mouth daily.     ALPRAZolam (XANAX) 1 MG tablet Take 1 mg by mouth 2 (two) times daily as needed for anxiety.     aspirin EC 81 MG tablet Take 81 mg by mouth daily. Swallow whole.     ASSESS FULL RANGE PEAK METER DEVI as directed.     cholecalciferol (VITAMIN D) 1000 UNITS tablet Take 3,000 Units by mouth daily.     EPINEPHrine (EPI-PEN) 0.3 mg/0.3 mL DEVI Inject 0.3 mg into the muscle once as needed.     ezetimibe-simvastatin (VYTORIN) 10-40 MG per tablet Take 1 tablet by mouth at bedtime.     fluticasone-salmeterol (ADVAIR HFA) 230-21 MCG/ACT inhaler Inhale 2 puffs into the lungs 2 (two) times daily. 1 each 12   furosemide (LASIX) 20 MG tablet Take 1 tablet (20 mg total) by mouth as needed for fluid or edema. 14 tablet 1   lenalidomide ([name]) 10 MG capsule Take 1 capsule (10 mg total)  by mouth daily. [employer] Auth # [id number]   Date Obtained 07/19/21  Take 1 capsule daily x 14 days, none for 7 days 14 capsule 0   levothyroxine (SYNTHROID) 150 MCG tablet Take 150 mcg by mouth every other day. Alternates with 175 mcg     levothyroxine (SYNTHROID) 175 MCG tablet Take 1 tablet by mouth every other day.     levothyroxine (SYNTHROID) 50 MCG tablet Take by mouth.     montelukast (SINGULAIR) 10 MG tablet Take 10 mg by mouth at bedtime.     Multiple Vitamin (MULTIVITAMIN) tablet Take 1 tablet by mouth daily.     omeprazole (PRILOSEC) 40 MG capsule Take 1 capsule by mouth  daily.     ondansetron (ZOFRAN) 8 MG tablet Take 1 tablet (8 mg total) by mouth every 8 (eight) hours as needed for nausea or vomiting. 30 tablet 0   predniSONE (DELTASONE) 10 MG tablet Take 5 tablets (50 mg total) by mouth daily with breakfast for 14 days, THEN 4 tablets (40 mg total) daily with breakfast for 14 days, THEN 3 tablets (30 mg total) daily with breakfast for 14 days, THEN 2 tablets (20 mg total) daily with breakfast for 14 days. Once you reach 55m, remain on 287muntil further notice.. 196 tablet 0   prochlorperazine (COMPAZINE) 10 MG tablet Take 1 tablet (10 mg total) by mouth every 6 (six) hours as needed for nausea or vomiting. 30 tablet 0   sildenafil (REVATIO) 20 MG tablet 2-5 tablets one hour prior to sexual intimacy     tamsulosin (FLOMAX) 0.4 MG CAPS capsule Take 0.4 mg by mouth daily.     triamcinolone cream (KENALOG) 0.1 % Apply topically.     No current facility-administered medications for this visit.    REVIEW OF SYSTEMS:   Constitutional: ( - ) fevers, ( - )  chills , ( - ) night sweats Eyes: ( - ) blurriness of vision, ( - ) double vision, ( - ) watery eyes Ears, nose, mouth, throat, and face: ( - ) mucositis, ( - ) sore throat Respiratory: ( - ) cough, ( - ) dyspnea, ( - ) wheezes Cardiovascular: ( - ) palpitation, ( - ) chest discomfort, ( - ) lower extremity swelling Gastrointestinal:   ( - ) nausea, ( - ) heartburn, ( - ) change in bowel habits Skin: ( - ) abnormal skin rashes Lymphatics: ( - ) new lymphadenopathy, ( - ) easy bruising Neurological: ( - ) numbness, ( - ) tingling, ( - ) new weaknesses Behavioral/Psych: ( - ) mood change, ( - ) new changes  All other systems were reviewed with the patient and are negative.  PHYSICAL EXAMINATION: ECOG PERFORMANCE STATUS: 0 - Asymptomatic  Vitals:   08/03/21 0823  BP: (!) 157/83  Pulse: 82  Resp: 20  Temp: (!) 97.4 F (36.3 C)  SpO2: 93%     Filed Weights   08/03/21 0823  Weight: 210 lb 11.2 oz (95.6 kg)    GENERAL: well appearing middle aged Caucasian male alert, no distress and comfortable SKIN: skin color, texture, turgor are normal, no rashes or significant lesions EYES: conjunctiva are pink and non-injected, sclera clear LUNGS: Decreased airflow with no crackles or rails.  No wheezing noted. HEART: regular rate & rhythm and no murmurs and no lower extremity edema Musculoskeletal: no cyanosis of digits and no clubbing  PSYCH: alert & oriented x 3, fluent speech NEURO: no focal motor/sensory deficits  LABORATORY DATA:  I have reviewed the data as listed CBC Latest Ref Rng & Units 08/03/2021 07/18/2021 07/12/2021  WBC 4.0 - 10.5 K/uL 13.1(H) 9.8 9.3  Hemoglobin 13.0 - 17.0 g/dL 14.1 13.2 13.1  Hematocrit 39.0 - 52.0 % 39.9 38.2(L) 38.8(L)  Platelets 150 - 400 K/uL 191 271 297.0    CMP Latest Ref Rng & Units 07/18/2021 07/05/2021 06/29/2021  Glucose 70 - 99 mg/dL 79 105(H) 97  BUN 8 - 23 mg/dL 25(H) 18 20  Creatinine 0.61 - 1.24 mg/dL 0.85 0.95 1.04  Sodium 135 - 145 mmol/L 129(L) 128(L) 130(L)  Potassium 3.5 - 5.1 mmol/L 4.1 4.7 4.6  Chloride 98 - 111 mmol/L 95(L) 96(L) 97(L)  CO2 22 -  32 mmol/L _0 Calcium 8.9 - 10.3 mg/dL 8.5(L) 9.1 8.8(L)  Total Protein 6.5 - 8.1 g/dL 6.9 7.1 7.1  Total Bilirubin 0.3 - 1.2 mg/dL 0.5 0.5 0.6  Alkaline Phos 38 - 126 U/L 68 55 54  AST 15 - 41 U/L _1 ALT  0 - 44 U/L 28 23 36    Lab Results  Component Value Date   MPROTEIN Not Observed 06/29/2021   MPROTEIN Not Observed 06/08/2021   MPROTEIN 0.4 (H) 05/11/2021   Lab Results  Component Value Date   KPAFRELGTCHN 22.6 (H) 06/29/2021   KPAFRELGTCHN 28.5 (H) 06/08/2021   KPAFRELGTCHN 29.4 (H) 05/11/2021   LAMBDASER 11.2 06/29/2021   LAMBDASER 14.2 06/08/2021   LAMBDASER 14.7 05/11/2021   KAPLAMBRATIO 2.02 (H) 06/29/2021   KAPLAMBRATIO 2.01 (H) 06/08/2021   KAPLAMBRATIO 2.00 (H) 05/11/2021    RADIOGRAPHIC STUDIES: I have personally reviewed the radiological images as listed and agreed with the findings in the report: lytic lesions in the hip bones bilaterally.  DG Chest 2 View  Result Date: 07/20/2021 CLINICAL DATA:  65 year old male with history of dyspnea. EXAM: CHEST - 2 VIEW COMPARISON:  Chest x-ray 07/12/2021. FINDINGS: Lung volumes are low. Diffuse interstitial prominence scattered throughout the lungs bilaterally. Diffuse peribronchial cuffing. No confluent consolidative airspace disease. No pleural effusions. No evidence of pulmonary edema. Heart size is normal. Upper mediastinal contours are within normal limits. IMPRESSION: 1. The appearance the chest is strongly suggestive of chronic interstitial lung disease. This could be better characterized with follow-up nonemergent high-resolution chest CT. No definite acute findings. Electronically Signed   By: Vinnie Langton M.D.   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 EXAM: CHEST - 2 VIEW COMPARISON:  Chest x-ray 06/18/2021. FINDINGS: Lung volumes are low. Diffuse interstitial prominence throughout the lungs bilaterally with no discernible craniocaudal gradient. No pleural effusions. No pneumothorax. No cephalization of the pulmonary vasculature. Heart size is normal. Upper mediastinal contours are within normal limits. IMPRESSION: 1. The  appearance the chest suggests interstitial lung disease, as above, and could be better characterized with high-resolution chest CT if clinically appropriate. Electronically Signed   By: Vinnie Langton M.D.   On: 07/12/2021 16:16   CT Chest High Resolution  Result Date: 07/31/2021 CLINICAL DATA:  Evaluate for interstitial lung disease EXAM: CT CHEST WITHOUT CONTRAST TECHNIQUE: Multidetector CT imaging of the chest was performed following the standard protocol without intravenous contrast. High resolution imaging of the lungs, as well as inspiratory and expiratory imaging, was performed. COMPARISON:  Chest CT dated June 15, 2021; abdomen and pelvis CT dated February 02, 2021 FINDINGS: Cardiovascular: Cardiomegaly with trace pericardial effusion. Coronary artery calcifications of the RCA and LAD. Atherosclerotic disease of the thoracic aorta. Mediastinum/Nodes: Esophagus is unremarkable. Atrophic thyroid. Mediastinal lymph nodes are decreased in size when compared with prior exam. Reference AP window lymph node on series 2, image 42 measures 1.1 cm in short axis, previously 1.3 cm. Lungs/Pleura: Central airways are patent. Images are motion degraded. Mild diffuse ground-glass opacity with peribronchovascular and subpleural reticular glass opacities and traction bronchiectasis. No clear craniocaudal predominance. Mild bilateral air trapping. 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 Stable solid right middle lobe pulmonary nodule measuring 3 mm on series 3, image 57. Upper Abdomen: No acute abnormality. Musculoskeletal: No chest wall mass or suspicious bone lesions identified. IMPRESSION: Limited evaluation due  to respiratory motion artifact. Within limitations, there are diffuse bilateral ground-glass opacities with peribronchovascular and subpleural reticular opacities, traction bronchiectasis and no clear craniocaudal predominance. Differential considerations include sequela of acute lung  injury, NSIP, or fibrotic HP given presence of air trapping. Mild subpleural reticular opacities were present on visualized portions of the lung on prior abdomen and pelvis CT dated February 02, 2021, although majority of findings are new. Findings are suggestive of an alternative diagnosis (not UIP) per consensus guidelines: Diagnosis of Idiopathic Pulmonary Fibrosis: An Official ATS/ERS/JRS/ALAT Clinical Practice Guideline. [place], Iss 5, [phone], Jun 21 2017. Small solid pulmonary nodule the right middle lobe measuring 3 mm. No follow-up needed if patient is low-risk. Non-contrast chest CT can be considered in 12 months if patient is high-risk. This recommendation follows the consensus statement: Guidelines for Management of Incidental Pulmonary Nodules Detected on CT Images: From the Fleischner Society 2017; Radiology 2017; 284:228-243. Aortic Atherosclerosis (ICD10-I70.0). Electronically Signed   By: Yetta Glassman M.D.   On: 07/31/2021 12:01     ASSESSMENT & PLAN Ernesto Zukowski Dupriest 65 y.o. male with medical history significant for IgG Kappa multiple myeloma who presents for a follow up visit.   After review of the labs, review of the records, and discussion with the patient the findings are most consistent with an IgG kappa multiple myeloma.  The patient has 2 lytic lesions within the pelvic bones (more noted in spine on CT scan) as well as 10% plasma cells within the bone marrow biopsy.  This combined with his serological findings confirm the diagnosis of multiple myeloma.  The initial treatment of choice for this patient's multiple myeloma was VRd. This will consist of bortezomib 1.47m/m2 on days 1, 8, 15, dexamethasone 416mon days 1,8, and 15, and revlimid 2546mO daily days 1-14. Cycles will consist of 21 days. We will use this regimen to stabilize the patient's myeloma, then make a referral to a BMT center of his choosing for consideration of a bone marrow transplant once  he has been noted to have a good response (VGPR or better). Zometa was started after dental clearance was obtained. This will be continued x 2 years.   Unfortunately had a drug reaction to [name] and was admitted to the hospital from 06/15/2021 until 06/18/2021.  The patient underwent a B AL which clearly showed evidence of eosinophils.  This is consistent with a drug reaction most likely caused by the patient's [name].  Discussed the case with pulmonology who recommended that we not rechallenge for attempt other drugs in the same class such as pomalidomide.  [facility] might preference would be to continue that.  As such I would recommend we proceed with CyBorD chemotherapy.  Daratumumab-based therapy could have been considered, but is often paired with an immunologic.  Additionally I would like to preserve those for additional lines of therapy if necessary.  Therefore we will proceed with CyBorD chemotherapy have the patient referred to transplant.  R-IPSS score: Stage 2. PFS of 42 months  # IgG Kappa Multiple Myeloma (t11;14, standard risk)  --diagnostic criteria was met with monoclonal plasma cells in the bone marrow and lytic lesions on the bone --recommend proceeding with VRd chemotherapy as noted above --patient is young and healthy enough for consideration of BMT, though he is borderline with his age. Will consider referral pending response to treatment.  -- Cycle 1 Day 1 started on 03/16/2021 --decreased dexamethasone  to 21m PO weekly due to fluid overload. Also started lasix 287mPO (changed on 06/01/2021) --plan to HOLD revlimid moving forward after evidence of drug reaction in the lungs. --started CyBorD chemotherapy on 06/29/2021 --patient has reached a VGPR. [facility]  Plan: --will HOLD chemotherapy at this time, allowing his respiratory status to recuperate and pulmonology to help optimize his  breathing prior to rechallenging him --ILD is not improving on steroid therapy. Pulmonology is currently considering lung biopsy.  --At the risk of worsening his lung function will continue to hold therapy.  --RTC 2 weeks to readdress.   #Drug Reaction in the Lung --confirmed with increased eosinophils on BAL during admission for shortness of breath --currently on a steroid taper, recently dose increased and restarted.  --will need to hold revlimid and and avoid the use of pomalidomide moving forward. Do no recommend rechallenge --patient will continue to follow up with pulmonology.   #Supportive Care -- chemotherapy education complete --zometa therapy started on 04/05/2021 (4 mg IV q 3 months), next dose Dec 2022 --ASA 8138mO daily for thromboprophylaxis on revlimid -- zofran 8mg60mH PRN and compazine 10mg76mq6H for nausea -- acyclovir 400mg 74mID for VCZ prophylaxis -- no pain medication required at this time.   No orders of the defined types were placed in this encounter.   All questions were answered. The patient knows to call the clinic with any problems, questions or concerns.  A total of more than 30 minutes were spent on this encounter with face-to-face time and non-face-to-face time, including preparing to see the patient, ordering tests and/or medications, counseling the patient and coordination of care as outlined above.   Kiet Geer TLedell Peoplesepartment of Hematology/Oncology [facility]: [phone]: [phone]: [email or web address]  08/03/2021 8:33 AM

## 2021-08-06 LAB — KAPPA/LAMBDA LIGHT CHAINS
Kappa free light chain: 28.7 mg/L — ABNORMAL HIGH (ref 3.3–19.4)
Kappa, lambda light chain ratio: 2.19 — ABNORMAL HIGH (ref 0.26–1.65)
Lambda free light chains: 13.1 mg/L (ref 5.7–26.3)

## 2021-08-07 ENCOUNTER — Encounter: Payer: Self-pay | Admitting: Hematology and Oncology

## 2021-08-07 ENCOUNTER — Other Ambulatory Visit: Payer: Self-pay | Admitting: Hematology and Oncology

## 2021-08-07 LAB — MULTIPLE MYELOMA PANEL, SERUM
Albumin SerPl Elph-Mcnc: 3.5 g/dL (ref 2.9–4.4)
Albumin/Glob SerPl: 1.2 (ref 0.7–1.7)
Alpha 1: 0.3 g/dL (ref 0.0–0.4)
Alpha2 Glob SerPl Elph-Mcnc: 0.7 g/dL (ref 0.4–1.0)
B-Globulin SerPl Elph-Mcnc: 1 g/dL (ref 0.7–1.3)
Gamma Glob SerPl Elph-Mcnc: 1.1 g/dL (ref 0.4–1.8)
Globulin, Total: 3.1 g/dL (ref 2.2–3.9)
IgA: 222 mg/dL (ref 61–437)
IgG (Immunoglobin G), Serum: 1163 mg/dL (ref 603–1613)
IgM (Immunoglobulin M), Srm: 79 mg/dL (ref 20–172)
Total Protein ELP: 6.6 g/dL (ref 6.0–8.5)

## 2021-08-09 ENCOUNTER — Ambulatory Visit (INDEPENDENT_AMBULATORY_CARE_PROVIDER_SITE_OTHER): Payer: Managed Care, Other (non HMO) | Admitting: Internal Medicine

## 2021-08-09 ENCOUNTER — Other Ambulatory Visit: Payer: Self-pay

## 2021-08-09 ENCOUNTER — Encounter: Payer: Self-pay | Admitting: Internal Medicine

## 2021-08-09 ENCOUNTER — Telehealth: Payer: Self-pay | Admitting: Internal Medicine

## 2021-08-09 VITALS — BP 122/80 | HR 74 | Temp 98.1°F | Ht 70.0 in | Wt 212.8 lb

## 2021-08-09 DIAGNOSIS — R0609 Other forms of dyspnea: Secondary | ICD-10-CM

## 2021-08-09 DIAGNOSIS — J849 Interstitial pulmonary disease, unspecified: Secondary | ICD-10-CM | POA: Diagnosis not present

## 2021-08-09 DIAGNOSIS — Z8616 Personal history of COVID-19: Secondary | ICD-10-CM | POA: Diagnosis not present

## 2021-08-09 DIAGNOSIS — J189 Pneumonia, unspecified organism: Secondary | ICD-10-CM | POA: Diagnosis not present

## 2021-08-09 DIAGNOSIS — Z23 Encounter for immunization: Secondary | ICD-10-CM | POA: Diagnosis not present

## 2021-08-09 DIAGNOSIS — R918 Other nonspecific abnormal finding of lung field: Secondary | ICD-10-CM

## 2021-08-09 DIAGNOSIS — Z8249 Family history of ischemic heart disease and other diseases of the circulatory system: Secondary | ICD-10-CM

## 2021-08-09 DIAGNOSIS — T50905A Adverse effect of unspecified drugs, medicaments and biological substances, initial encounter: Secondary | ICD-10-CM

## 2021-08-09 DIAGNOSIS — J984 Other disorders of lung: Secondary | ICD-10-CM

## 2021-08-09 DIAGNOSIS — I2510 Atherosclerotic heart disease of native coronary artery without angina pectoris: Secondary | ICD-10-CM

## 2021-08-09 LAB — ACID FAST CULTURE WITH REFLEXED SENSITIVITIES (MYCOBACTERIA): Acid Fast Culture: NEGATIVE

## 2021-08-09 NOTE — Patient Instructions (Addendum)
ICD-10-CM   1. Interstitial pulmonary disease ([place])  J84.9     2. 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 History of 2019 novel coronavirus disease (COVID-19)  Z86.16     4. Ground glass opacity present on imaging of lung  R91.8     5. Dyspnea on exertion  R06.09     6. Coronary artery calcification seen on CAT scan  I25.10     7. Family history of early CAD  Z82.49       Interstitial pulmonary disease ([place]) Drug-induced pneumonitis Ground glass opacity present on imaging of lung Dyspnea on exertion  -Not getting better despite prednisone and being off chemo  Plan  - For symptom relief start nighttime oxygen 2 L - Do 6-minute walk test to see if you qualify for portable oxygen - Continue to hold off on chemo - I have written to Dr. Virgel Manifold to see if he will give [place] an opinion or otherwise see you rather soon.  He is at [facility] -High-dose flu shot today -Check COVID IgG -based on on this week in time scheduled COVID booster mRNA -Continue prednisone taper per schedule - Holding off on dapsone for PCP prophylaxis given risk for eosinophilia with that -Do repeat spirometry and DLCO mid November 2022 -Repeat bronchoscopy for lavage versus surgical lung biopsy -is under consideration pending further opinion/discussions from Dr. Ovid Curd   Coronary artery calcification seen on CAT scan Family history of early CAD   -Doubt that current shortness of breath with exertion related to coronary artery disease but nevertheless a good idea to rule out  Plan - Refer Dr. Einar Gip [I will also message him]  Follow-up - Mid November 2022 but after breathing test [30-minute visit with Dr. [name]

## 2021-08-09 NOTE — Telephone Encounter (Addendum)
Samuel [name]  You had seen ERMON SAGAN many many years ago.  This because his dad had MI at age 65.  His cardiac stress test was normal then.  He now had COVID in April 2022 and also had multiple myeloma.  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  He is extremely short of breath.  He is worried that this could be coronary related although clinically it seems that it is pulmonary related.  He has coronary artery calcification on a CT scan of the chest.  He wants to see you again and ensure he does not have active coronary artery disease.  In August 2022 his echo and troponin were normal  He and his wife extremely concerned about his health  Plan - Can you see him soon?   Thanks    SIGNATURE    Dr. Brand Males, M.D., F.C.C.P,  Pulmonary and Critical Care Medicine Staff Physician, [facility] Director - Interstitial Lung Disease  Program  Pulmonary [place] at [place], [place], [address]  NPI Number:  NPI #[id number]  Pager: [phone], If no answer  -> Check AMION or Try [phone] Telephone (clinical office): [phone] Telephone (research): [phone]  5:37 PM 08/09/2021

## 2021-08-09 NOTE — H&P (View-Only) (Signed)
IOV 10/04/2011  65 year old male. Allergies and possible asthma. Hypothyroidism. Anxiety state but no formal diagnosis. Has history of claustrophobia as well Non-smoker. Music therapist. Dad with CAD - s/p CABG at age 46 (now 89y)  Referred by Dr Donneta Romberg. Reports dyspnea. INsidious onset "all my life". Increased after starting allergy shots in June 2012. Says during hikes after he gets going it gets better. Same in gym. Notices more when he is starting out or stationary at rest. HE is not sure what it is.  Feels he needs to take a deep breath on occasion. So, now referred to cardiology and pulmonary. Says ICS Qvar in august/sept 2012 made it worse. Outside chart mentions asthma but patient says not sure if he has asthma. But reports childhood hx of asthma diagnosed by a pediatrician at age 33. Says he had similar symptoms.  Up until 1990 used to 10K but even back then had similar symptoms and would need to warm up before feeling better. Then stopped running due to neck problems. Similar during swimming exercise in 1996-1997. Quit swimming 1999. Episodes associated with wheezing but no cough. Unclear if albuterol helps but on xopenex prn esp before walking. This whole thing feels like he is not getting "enough juice". Never had PFT but recollects normal spirometry at Dr Rush [place] office.  He has seen Dr Gwendel Hanson cardiology for above - treadmill and echo and carotid doppler all pending. Current pulmonary modulating drug is Singulair only  Chest x-ray 07/16/2011 is clear per my personal review.  xxxxxxxxxxxxxxxxxxxxxxxxxxxxxxxxxxxxxxxxxxxxxxxxxxxxxxxxxxxxxxxxxx  Inpatient consult 06/16/21 65 y/o male with a history of multiple myeloma diagnosed in 2022, currently undergoing treatment presented to the [facility] emergency department from his hematology oncology appointment with a chief complaint of dyspnea.  The patient smoked cigarettes briefly as a young adult but since then has lived a relatively healthy  lifestyle staying active with exercise and playing golf.  He was diagnosed with multiple myeloma in the spring 2022 when a bruise was discovered while he was receiving a sports massage.  He went for further work-up and was found to have abnormal lab work, this led to a bone marrow biopsy confirming diagnosis of multiple myeloma.  He has been followed by Dr. [name] who has been treating him with VRd chemotherapy since Mar 16, 2021.  He is currently in the middle of his fifth cycle.     Over the last several weeks he has been developing progressive shortness of breath with exertion.  He says this has been going on for about 3 to 4 weeks and has significantly worsened in the last week.  This is associated with a dry, rare cough.  He denies fevers, chills, leg swelling, weight gain, or mucus production.  In the last several days (just prior to admission) [place] and while there felt significant shortness of breath so he presented to Dr. Libby Maw office for further evaluation.  He was sent to the emergency room for evaluation further because of the severity of his shortness of breath.  In the emergency department he was noted to have hypoxemia to an O2 saturation of 87% and an abnormal chest exam.  Pulmonary and critical care medicine was consulted for further evaluation.  He says that throughout his adult life he has had what he calls "huffing" when he talks while exerting himself or with exertion.  He says he has been followed by an allergist and has been treated for asthma at times over the  years.  He does not regularly use an inhaler and he says this is not something that he routinely needs.  He was seen by my partner Dr. [name] at 1 point in 2013 during which time he had a normal chest x-ray and normal lung function testing.   He had COVID in April 2022.   June 15, 2021 admission August 26 CT chest images independently reviewed: No pulmonary embolism, diffuse interlobular septal  thickening, pleural nodularity noted, areas of groundglass opacification with some patchy distribution particularly in the lung base, no significant pleural effusion, mild reactive appearing lymphadenopathy in the mediastinum Acute respiratory failure with hypoxemia in the setting of diffuse parenchymal lung disease of undetermined etiology: This is somewhat of a complex case given his underlying asthma and the fact that he had COVID in 2022 and in April 2022 a CT scan of his abdomen showed some ill-defined interstitial changes in the periphery of his lungs on lung windows in the bases.  Based on the time course of his illness and reports in the literature I am most concerned about [name] lung toxicity (see citation below), though it is also important to evaluate for another underlying and progressive primary pulmonary ILD or less likely an opportunistic infection (history doesn't seem to support this).  [name] can cause eosinophilic pneumonia which we can assess for with a bronchoscopy, but his imaging characteristics aren't typical for this.  It is possible that the non-specific interstitial changes seen on his lungs in April 2022 were related to his recent COVID infection.  I don't think his baseline asthma explains his symptoms, though he did have a high serum eosinophil count a few weeks prior to admission (unclear significance).  Saglam B, Paulita Fujita M, Ornek S, Keske S, Tabak L, Cakar N, Zeren H, Aytekin S, [place], Ferhanoglu B. Bortezomib induced pulmonary toxicity: a case report and review of the literature. Am J Blood Res. 2020 Dec 15;10(6):407-415. PMID: 34196222; PMCID: [id number].  06/18/21 -   Results for GARL, SPEIGNER" (MRN [id number]) as of 07/12/2021 09:13  Ref. Range 06/18/2021 12:57  Monocyte-Macrophage-Serous Fluid Latest Ref Range: 50 - 90 % 5 (L)  Other Cells, Fluid Latest Units: % CORRELATE WITH CYTOLOGY.  Color, Fluid Latest Ref Range: YELLOW  PINK (A)  Total  Nucleated Cell Count, Fluid Latest Ref Range: 0 - 1,000 cu mm 103  Fluid Type-FCT Unknown BRONCHIAL ALVEOLAR LAVAGE  Lymphs, Fluid Latest Units: % 18  Eos, Fluid Latest Units: % 51  Appearance, Fluid Latest Ref Range: CLEAR  HAZY (A)  Neutrophil Count, Fluid Latest Ref Range: 0 - 25 % 26 (H)  FINAL MICROSCOPIC DIAGNOSIS:  - No malignant cells identified  - Benign bronchial cells and pulmonary macrophages   Titrate off solumedrol and start prednisone 50 mg daily. Decrease by 10 mg weekly    OV 07/12/2021  Subjective:  Patient ID: Billee Cashing, male , DOB: [birth year] , age 62 y.o. , MRN: [id number] , ADDRESS: [place] [id number] PCP Antony Contras, MD Patient Care Team: Antony Contras, MD as PCP - General (Family Medicine)  This Provider for this visit: Treatment Team:  Attending Provider: Brand Males, MD    07/12/2021 -   Chief Complaint  Patient presents with   Hospitalization Follow-up    Pt states he was doing better after being out of the hospital after being placed on steroids. States about a week after being out, states he started having more problems with SOB and  to today, he has had problems with SOB with exertion.   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 -presents for follow-up from the hospital.  I originally met him 10 years ago and then at that point in time he had dyspnea.  He tells me that after that he was exercising and living a good life.  Then in April 2022 got diagnosed incidentally with myeloma multiple.  Says he was caught early.  Then in May 2022 started on [name] and [name].  He says he was doing well with this up until mid July.  Up until then he was walking 1-1/2 2 miles per day 5 days a week.  Then in late July 2022 started noticing shortness of breath.  He met Dr. [name] his oncologist on May 25, 2021.  By June 01, 2021 he had severe pulmonary  infiltrates.  He says a part of then he was getting his [name] once a week along with some steroids with each dose.  At this point in time the steroid dose was cut down but he progressed and started getting worse.  Then on June 15, 2021 he was in significant respiratory distress and admitted to the hospital.  I reviewed the hospital records.  He was there for 3 days.  He underwent bronchoscopy with BAL there is also 51% eosinophilia.  Cultures are negative malignant cells are negative.  He was seen by my pulmonary colleagues.  Discussion was held with the [name] other [name] was the etiology.  Given the use and failure it looks like his [name] has been held.  He was discharged on 50 mg prednisone with advised to taper it by 10 mg/week.  Currently is on 20 mg prednisone.  He started playing golf again and feeling really good through Labor Day weekend..  Then on June 29, 2021 his [name] was not given.  Up until this point all chemo was held.  He was given Cytoxan, Zomenta and [name].  The very next in June 30, 2021 he started feeling worse.  He feels [name] is the etiology for this.  On July 06, 2021 he communicated this with Dr. [name] and all chemo has been held.  He is being referred to the bone marrow transplant program at [facility] for further evaluation.  At this point in time he says he is much better than when he was in the hospital but definitely not as good as he was prior to getting a rechallenge with [name] on June 29, 2021.  But he notices that he slowly improving.  Walking desaturation test shows that his pulse ox is holding up although he does have a tendency to desaturate.   Did extensive review of the literature.  According to up-to-date [name] is a known etiology for pulm eosinophilia but he got worse after getting rechallenged with [name].  Review of the literature shows that [name] can cause drug-induced pneumonitis and a small fraction of patients.  The  literature is  NO INFOR  as to whether these patients have eosinophilia or not but definitely they seem to be steroid responsive.  This literature is attached below.  In the timeframe also fits in with [name].  He is also noticed to be on allopurinol which rarely can cause hypersensitivity syndrome or dermatitis, hepatitis and eosinophilia - ADDRESS Syndrome but he did not have thse features of rash etc     Nevertheless significant eosinophilia in the BAL 51%  suggest drug-induced pneumonitis.  He currently has significant symptoms of shortness of breath.  He does not have oxygen with him at home.  Simple office walk 185 feet x  3 laps goal with forehead probe 07/12/2021    O2 used ra   Number laps completed 3   Comments about pace slow   Resting Pulse Ox/HR 100% and 69/min   Final Pulse Ox/HR 92% and 81/min   Desaturated </= 88% no   Desaturated <= 3% points Yes 8   Got Tachycardic >/= 90/min no   Symptoms at end of test No complaints   Miscellaneous comments x        Causes of Pulmonary Eosinophilia: from UPTODATE A. Nonsteroidal antiinflammatory drugs (NSAIDs) B. Nitrofurantoin  C. Minocycline D. Sulfonamides/ Sulfamethoxazole E. Ampicillin F. Daptomycin G.Vancomycin H. Dapsone I. Beta-lactam antibiotics J. Nevirapine K. Telaprevir L. Sulfasalazine M. Methotrexate N. Mesalamine O. Amiodarone P. Bleomycin Q. ACE Inhibitor R. Beta blocker S. Hydrochlorothiazides T. L- Tryptophan U. Allopurinol V. Carbamazepine W. Lamotrigine X. Phenytoin Y. Phenindione Z. Fluindione AA. Olanzapine AB. Oxcarbazepine AC. Strontium Ranelate AD. Lenalidomide AE. Radiographic contrast media  Saglam B, Kalyon H, Ozbalak M, Ornek S, Keske S, Tabak L, Cakar N, Zeren H, Aytekin S, [place], [place] B. Bortezomib induced pulmonary toxicity: a case report and review of the literature. Am J Blood Res. 2020 Dec 15;10(6):407-415. PMID: 35329924; PMCID: [id number]. - Bortezomib  related pulmonary toxicities are rarely reported. Although the incidence of Bortezomib induced lung injury (BLI) is unknown, in a large registry study of 1010 MM patients, 45 patients were reported to have BLI by their physician. However, the causality could only be constituted in 26 patients (2.6%), with 5 of them resulting in death despite steroid treatment.In the case reports, the average number of RVD cycles until the toxicity was presented was 6.9, and the period between the development of pulmonary toxicity and the first dose of Bortezomib was 31.1 days -No mention of eosinophilia  Bortezomib therapy-related lung disease in [place] patients with multiple myeloma: Incidence, mortality and clinical characterization Dyann Ruddle [facility] Miyao,2 [place] Masahiko Kusumoto,5 [place] Fumikazu [facility],9 [place] Sugiyama,8 [place] Hatake,9 [place] Pao Fukuda,10 and [place] patients registered, 102 (4.5%) developed BILD, 5 (0.50%) of whom had fatal cases. The median time to BILD onset from the first bortezomib dose was 14.5 days, and most of the patients responded well to corticosteroid therapy. A retrospective review by the Lung Injury Medical Expert Panel revealed that the types with capillary leak syndrome and hypoxia without infiltrative shadows were uniquely and frequently observed in patients with BIL - no mention of eosinophilia  CellularOperator.fi =- allopurinol complex multisystem Hypersensitivityey   has a past medical history of Asthma, High cholesterol, Perennial allergic rhinitis, Sciatic pain, right, Seasonal allergic rhinitis, and Thyroid disease.   reports that he quit smoki     07/20/2021  - Visit, NP [name]    65 year old male former smoker followed in our office for shortness of breath and interstitial lung disease.  Established with Dr. [name].  Initially  consulted when inpatient with our team in August/2022.  Chief complaint at that point time was dyspnea.  He was diagnosed with multiple myeloma in the spring 2022 when a bruise was discovered while he was recovering from a sports massage.  He went for further work-up and he was found to have abnormal lab work this led to a bone marrow biopsy confirming  diagnosis of multiple myeloma.  He has been followed by Dr. [name].  He was in the middle of treating him with VRD chemotherapy since May/20 04/2021.  He was in the middle of his fifth cycle.  Patient also had [place] in April/2022.  Chest CT in April/2022 showed ill-defined interstitial changes in the periphery.  Concern for [name] lung toxicity.  Patient also had a bronchoscopy performed in 06/18/2021.  Eosinophils 51%, lymphs 18%, no malignant cells identified patient was started on a long-term prednisone taper.  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  Still currently having significant symptoms of shortness of breath.    Last seen in our office on 07/12/2021 by Dr. [name].  Plan of care at that office visit was as follows: Stop [name], check chest x-ray, check CBC with differential, blood ESR, blood IgE, vitamin D, hemoglobin A1c, G6PD, walk test, check Ono at home, change prednisone to 50 mg daily for 2 weeks, then 40 mg daily for 2 weeks, then 30 mg daily for 2 weeks, then 20 mg daily for 2 weeks, 2-week follow-up with APP or Dr. [name], 4-week follow-up with Dr. [name] and 30-minute time slot.  Per chart review on 07/13/2021 [name] was discontinued.  Patient was also seen by Dr. [name] on 07/18/2021 I am unable to view this note.  Patient reporting that weight team would like for him to resume [name] at a maintenance dose of 10 mg.  This is the current plan.  He will see oncology next Friday for blood work.  Patient is scheduled to complete an overnight oximetry test next Tuesday.  He remains  adherent to his prednisone taper 50 mg daily.  Patient reports that this weekend he did play 9 holes of golf on Saturday and on Sunday.  He was limited by his physical exertion.  He also exercised on Tuesday and worked out with a Clinical research associate.  Patient is eager to get back to baseline physical activity.  Patient reports that on Wednesday (07/18/2021) of this week he had increased shortness of breath, cough, worsened acid reflux and he vomited.  Patient believes that this may also be due to the fact that he ate a dinner meal quite quickly.  This sometimes happens when he does this.  He also reports that his blood pressure was high at that time.  Patient reports that he has been off the [name] for at least 1 month.  Patient has stopped his allopurinol as of 07/18/2021.  Patient and spouse are both frustrated regarding dyspnea and have hopes that he would be improving quicker.  There are also concerns that he may have acute worsened symptoms or an acute infection such as bronchitis.  We will discuss and evaluate for this today.  Walk today in office: 07/20/2021-completed 2 laps on room air, dropped to 93%  07/26/2021- Interim hx  Patient presents today for 1 week follow-up. ILD felt to be related to drug pneumonitis from [name] as well as [name] for his treatment of multiple myeloma. BAL showed significant eosinophilia. Dr. [name] lowered dose of [name] to 29m daily. CXR on 07/20/21 showed chronic ILD, no definite acute findings. Ambulatory walk during his last visit showed no oxygen desaturations. During his last visit he was ordered for HRCT, PFTs and ONO.   Accompanied by his wife. He had a bad weekend, his respiratory symptoms have been slightly better the last two days. He is currently off BOTH [name] and [name] (may retry [name] at lower  dose). He stopped using BREO d/t throat irritation. Xanax has helped relieves some anxiety and chest tightness. Wife reports that he is sleeping better. He in  on prolonged prednisone taper. He will be starting 42m prednisone tomorrow  x 2 weeks. He is taking Singulair and generic fluticasone nasal spray. He has been off allergy shots since August. HRCT and PFTs are scheduled for next week. Awaiting results to be faxed for ONO from [employer].    OV 08/02/2021 -   Subjective:  Patient ID: VBillee Cashing male , DOB: 1December 22, 1957, age 65y.o. , MRN: [id number], [id number] SAntony Contras MD Patient Care Team: SAntony Contras MD as PCP - General (Family Medicine)  This Provider for this visit: Treatment Team:  Attending Provider: RBrand Males MD  Type of visit: Telephone/Video Circumstance: COVID-19 national emergency Identification of patient VGONSALO CUTHBERTSONwith 110-27-1957and MRN [id number]- 2 person identifier Risks: Risks, benefits, limitations of telephone visit explained. 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 CT abdomen/pelvis: Multiple small lytic lesions in the thoracolumbar spine and bilateral pelvis -SPEP: IgG 2,082 (H), M Protein 1.8 (H). IFE shows IgG monoclonal protein with kappa light chain specificity.  -LDH 169, CBC normal, CMP normal except for sodium 131 (L), Chloride 96 (L).   02/14/2021: Establish care with IDede QueryPA-C 02/22/2021: bone marrow biopsy confirms the diagnosis of Multiple Myeloma with a monoclonal plasma cell population.  03/16/2021: Cycle 1 Day 1 of VRd chemotherapy  04/06/2021: Cycle 2 Day 1 of VRd chemotherapy  04/27/2021: Cycle 3 Day 1 of VRd chemotherapy  05/18/2021: Cycle 4 Day 1 of VRd chemotherapy  06/01/2021: drop  dexamethasone to 265mPO weekly and start lasix due to shortness of breath.  06/15/2021: Desaturation to 87% on ambulation. HELD velcade today and sent to ED for evaluation.  06/22/2021: Findings consistent with drug reaction the lungs with eosinophils on BAL.  Given these findings we will definitely hold [name] and plan to avoid pomalidomide 06/29/2021: Cycle 1 Day 1 CyBorD chemotherapy   HPI ViQuinto Tippyosco 89.o. -there is a telephone visit.  He is supposed to see me next week but he had a CT scan of the chest 2 days ago and had pulmonary function test today.  He really wanted to discuss the results today itself.  Review of the records indicate that he still on prednisone taper.  He is able to ambulate and desaturated only after 200 feet.  There are some mild nocturnal desaturation.  We started 2 L of nasal cannula oxygen.  His CT scan of the chest shows diffuse groundglass opacity in a pattern that is inconsistent with UIP [less than 40% chance this is UIP] suggestive of alternate pattern.  He says he is able to do weight training exercises well but when he walks on a treadmill or does ambulation that is when it bothers him.  When he rests he is better.  He does desaturate to 80s percent.  He is wondering if this could be from asthma.  I told him otherwise.  Touch base with Dr. DoLorenso Courieris oncologist.  He is scheduled for cyclophosphamide  tomorrow along with low-dose [name].  This was held off recently in September 2022.  But oncology is wanting to rechallenge him with low-dose [name].  They wanted approval for this.   ONO RA 07/24/21   - - 45 min spent <88%. 7+ hours was > 90%. OVerall not bad  Plan  Start 2L Branch QHS  Results for JAIVION, KINGSLEY" (MRN [id number]) as of 08/02/2021 12:16  Ref. Range 08/02/2021  08/02/2021   FVC-Pre Latest Units: L 1.55   FVC-%Pred-Pre Latest Units: % 33   Results for DURELLE, ZEPEDA" (MRN [id number]) as of 08/02/2021 12:16  Ref. Range  08/02/2021    DLCO cor Latest Units: ml/min/mmHg 12.52   DLCO cor % pred Latest Units: % 46   Results for MOE, GRACA" (MRN [id number]) as of 08/02/2021 12:16  Ref. Range 08/02/2021    TLC Latest Units: L 4.18   TLC % pred Latest Units: % 59    Results for CLABORN, JANUSZ" (MRN [id number]) as of 08/02/2021 12:16  Ref. Range 07/12/2021 09:54  G-6PDH Latest Ref Range: 7.0 - 20.5 U/g Hgb 14.8   CT Chest data  CT Chest High Resolution  Result Date: 07/31/2021 CLINICAL DATA:  Evaluate for interstitial lung disease EXAM: CT CHEST WITHOUT CONTRAST TECHNIQUE: Multidetector CT imaging of the chest was performed following the standard protocol without intravenous contrast. High resolution imaging of the lungs, as well as inspiratory and expiratory imaging, was performed. COMPARISON:  Chest CT dated June 15, 2021; abdomen and pelvis CT dated February 02, 2021 FINDINGS: Cardiovascular: Cardiomegaly with trace pericardial effusion. Coronary artery calcifications of the RCA and LAD. Atherosclerotic disease of the thoracic aorta. Mediastinum/Nodes: Esophagus is unremarkable. Atrophic thyroid. Mediastinal lymph nodes are decreased in size when compared with prior exam. Reference AP window lymph node on series 2, image 42 measures 1.1 cm in short axis, previously 1.3 cm. Lungs/Pleura: Central airways are patent. Images are motion degraded. Mild diffuse ground-glass opacity with peribronchovascular and subpleural reticular glass opacities and traction bronchiectasis. No clear craniocaudal predominance. Mild bilateral air trapping. 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 Stable solid right middle lobe pulmonary nodule measuring 3 mm on series 3, image 57. Upper Abdomen: No acute abnormality. Musculoskeletal: No chest wall mass or suspicious bone lesions identified. IMPRESSION: Limited evaluation due to respiratory motion artifact. Within limitations, there are diffuse bilateral  ground-glass opacities with peribronchovascular and subpleural reticular opacities, traction bronchiectasis and no clear craniocaudal predominance. Differential considerations include sequela of acute lung injury, NSIP, or fibrotic HP given presence of air trapping. Mild subpleural reticular opacities were present on visualized portions of the lung on prior abdomen and pelvis CT dated February 02, 2021, although majority of findings are new. Findings are suggestive of an alternative diagnosis (not UIP) per consensus guidelines: Diagnosis of Idiopathic Pulmonary Fibrosis: An Official ATS/ERS/JRS/ALAT Clinical Practice Guideline. [place], Iss 5, [id number], Jun 21 2017. Small solid pulmonary nodule the right middle lobe measuring 3 mm. No follow-up needed if patient is low-risk. Non-contrast chest CT can be considered in 12 months if patient is high-risk. This recommendation follows the consensus statement: Guidelines for Management of Incidental Pulmonary Nodules Detected on CT Images: From the Fleischner Society 2017; Radiology 2017; 284:228-243. Aortic Atherosclerosis (ICD10-I70.0). Electronically Signed   By: Yetta Glassman M.D.   On: 07/31/2021 12:01          OV 08/09/2021  Subjective:  Patient ID: Evette Doffing  Marella Chimes, male , DOB: Apr 14, 1956 , age 71 y.o. , MRN: [id number] , ADDRESS: [place] [id number] PCP Antony Contras, MD Patient Care Team: Antony Contras, MD as PCP - General (Family Medicine)  This Provider for this visit: Treatment Team:  Attending Provider: Brand Males, MD    08/09/2021 -   Chief Complaint  Patient presents with   Follow-up    Pt states he is about the same since last visit, maybe a little better. Pt is coughing more at times and states he does cough a lot before bed.   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  -returns for follow-up.  He is finishing up 40 mg of prednisone per day.  Restarting 30 mg/day of prednisone tomorrow.  He is not really better.  Today he was able to only walk 2 out of the customary 3 laps.  And he showed a tendency to desaturate.  He says he is extremely anxious.  This is understandable.  He also states that when he lifts weights in the gym he does not have a problem but when he exerts himself he feels worse.  Previously his nocturnal desaturation test showed abnormality and we recommended night oxygen but he declined per the CMA.  But today he and his wife tell me that they would be interested in getting some oxygen if it would help with shortness of breath.  Simple walking desaturation test does not make him qualify and will need a 6-minute walk test to qualify for portable oxygen.  They are also worried about the lack of improvement.  They are wondering about second opinion.  I did unofficially check with some of my colleagues in the [place].  No clear-cut plan has been developed.  We discussed about the possibility of visiting Dr. Virgel Manifold, ILD group in [place].  They seemed enthused about the idea but wanted me to reach out to Dr. Ovid Curd first.  In terms of his myeloma review of the medical records from office visit 08/03/2021 with Dr. [name] and talking to the patient and the labs show the myeloma still in remission.  But Dr. [name] is extremely concerned that the myeloma will come back.  Oncology still wants to rechallenge him with [name] but after my personal discussion with Dr. [name] on 08/02/2021 and concern for pulmonary toxicity chemotherapy is on hold till patient recovers.  He is also concerned about the presence of coronary artery calcification on his recent CT scan of the chest.  His dad had coronary artery disease diagnosed at 67 and had bypass.  He wants to visit this with Dr. Einar Gip again     SYMPTOM SCALE - ILD 08/09/2021  Current weight   O2 use  ra  Shortness of Breath 0 -> 5 scale with 5 being worst (score 6 If unable to do)  At rest 0  Simple tasks - showers, clothes change, eating, shaving 2  Household (dishes, doing bed, laundry) 4  Shopping 3  Walking level at own pace 4  Walking up Stairs 5  Total (30-36) Dyspnea Score 18  How bad is your cough? 3  How bad is your fatigue 0  How bad is nausea 0  How bad is vomiting?  0  How bad is diarrhea? 0  How bad is anxiety? 5  How bad is depression 2  Any chronic pain - if so where and how bad  x       Simple office walk 185 feet x  3 laps goal with forehead probe 07/12/2021  08/09/2021   O2 used ra Ra3  Number laps completed 3 3 but did oly 2  Comments about pace slow slow  Resting Pulse Ox/HR 100% and 69/min 100%ad 74  Final Pulse Ox/HR 92% and 81/min 93% and 87  Desaturated </= 88% no no  Desaturated <= 3% points Yes 8 Yes, 7 pponts  Got Tachycardic >/= 90/min no no  Symptoms at end of test No complaints Mod-severe dyspnea  Miscellaneous comments x Worse?        PFT  PFT Results Latest Ref Rng & Units 06/15/2021  FVC-Pre L 1.55  FVC-Predicted Pre % 33  FVC-Post L 1.45  FVC-Predicted Post % 31  Pre FEV1/FVC % % 83  Post FEV1/FCV % % 83  FEV1-Pre L 1.28  FEV1-Predicted Pre % 37  FEV1-Post L 1.20  DLCO uncorrected ml/min/mmHg 12.00  DLCO UNC% % 44  DLCO corrected ml/min/mmHg 12.52  DLCO COR %Predicted % 46  DLVA Predicted % 118  TLC L 4.18  TLC % Predicted % 59  RV % Predicted % 95       has a past medical history of Asthma, High cholesterol, Perennial allergic rhinitis, Sciatic pain, right, Seasonal allergic rhinitis, and Thyroid disease.   reports that he quit smoking about 38 years ago. His smoking use included cigarettes. He has a 0.30 pack-year smoking history. He has never used smokeless tobacco.  Past Surgical History:  Procedure Laterality Date   BRONCHIAL BIOPSY  06/18/2021   Procedure: BRONCHIAL BIOPSIES;  Surgeon: Collene Gobble,  MD;  Location: [facility];  Service: Cardiopulmonary;;   BRONCHIAL WASHINGS  06/18/2021   Procedure: BRONCHIAL WASHINGS;  Surgeon: Collene Gobble, MD;  Location: [facility];  Service: Cardiopulmonary;;   NASAL SINUS SURGERY     NECK SURGERY  1996   VIDEO BRONCHOSCOPY N/A 06/18/2021   Procedure: VIDEO BRONCHOSCOPY WITH FLUORO;  Surgeon: Collene Gobble, MD;  Location: [facility];  Service: Cardiopulmonary;  Laterality: N/A;    Allergies  Allergen Reactions   Clarithromycin    Penicillins     Immunization History  Administered Date(s) Administered   Fluad Quad(high Dose 65+) 08/09/2021   Influenza Split 10/27/2017   Influenza, High Dose Seasonal PF 12/01/2017, 11/29/2019, 12/19/2020   Influenza, Quadrivalent, Recombinant, Inj, Pf 07/28/2019, 08/11/2020   Influenza-Unspecified 11/26/2011, 07/21/2017   PFIZER(Purple Top)SARS-COV-2 Vaccination 12/27/2019, 01/24/2020, 09/21/2020   Tdap 08/20/2005, 09/07/2015   Zoster, Live 11/06/2017, 05/07/2018    Family History  Problem Relation Age of Onset   Allergies Father    Allergies Mother    Hypertension Other    Heart disease Other    Breast cancer Paternal Grandmother      Current Outpatient Medications:    acyclovir (ZOVIRAX) 400 MG tablet, Take 1 tablet (400 mg total) by mouth 2 (two) times daily., Disp: 60 tablet, Rfl: 3   albuterol (VENTOLIN HFA) 108 (90 Base) MCG/ACT inhaler, Inhale 2 puffs into the lungs every 6 hours as needed for wheezing or shortness of breath., Disp: 8.5 g, Rfl: 1   allopurinol (ZYLOPRIM) 300 MG tablet, Take 300 mg by mouth daily., Disp: , Rfl:    ALPRAZolam (XANAX) 1 MG tablet, Take 1 mg by mouth 2 (two) times daily as needed for anxiety., Disp: , Rfl:    aspirin EC 81 MG tablet, Take 81 mg by mouth daily. Swallow whole., Disp: , Rfl:  ASSESS FULL RANGE PEAK METER DEVI, as directed., Disp: , Rfl:    cholecalciferol (VITAMIN D) 1000 UNITS tablet, Take 3,000 Units by mouth daily., Disp: , Rfl:     EPINEPHrine (EPI-PEN) 0.3 mg/0.3 mL DEVI, Inject 0.3 mg into the muscle once as needed., Disp: , Rfl:    ezetimibe-simvastatin (VYTORIN) 10-40 MG per tablet, Take 1 tablet by mouth at bedtime., Disp: , Rfl:    fluticasone-salmeterol (ADVAIR HFA) 230-21 MCG/ACT inhaler, Inhale 2 puffs into the lungs 2 (two) times daily., Disp: 1 each, Rfl: 12   furosemide (LASIX) 20 MG tablet, Take 1 tablet (20 mg total) by mouth as needed for fluid or edema., Disp: 14 tablet, Rfl: 1   lenalidomide ([name]) 10 MG capsule, Take 1 capsule (10 mg total) by mouth daily. [employer] Auth # [id number]   Date Obtained 07/19/21  Take 1 capsule daily x 14 days, none for 7 days, Disp: 14 capsule, Rfl: 0   levothyroxine (SYNTHROID) 150 MCG tablet, Take 150 mcg by mouth every other day. Alternates with 175 mcg, Disp: , Rfl:    levothyroxine (SYNTHROID) 175 MCG tablet, Take 1 tablet by mouth every other day., Disp: , Rfl:    montelukast (SINGULAIR) 10 MG tablet, Take 10 mg by mouth at bedtime., Disp: , Rfl:    Multiple Vitamin (MULTIVITAMIN) tablet, Take 1 tablet by mouth daily., Disp: , Rfl:    omeprazole (PRILOSEC) 40 MG capsule, Take 1 capsule by mouth daily., Disp: , Rfl:    ondansetron (ZOFRAN) 8 MG tablet, Take 1 tablet (8 mg total) by mouth every 8 (eight) hours as needed for nausea or vomiting., Disp: 30 tablet, Rfl: 0   predniSONE (DELTASONE) 10 MG tablet, Take 5 tablets (50 mg total) by mouth daily with breakfast for 14 days, THEN 4 tablets (40 mg total) daily with breakfast for 14 days, THEN 3 tablets (30 mg total) daily with breakfast for 14 days, THEN 2 tablets (20 mg total) daily with breakfast for 14 days. Once you reach 55m, remain on 274muntil further notice.., Disp: 196 tablet, Rfl: 0   prochlorperazine (COMPAZINE) 10 MG tablet, Take 1 tablet (10 mg total) by mouth every 6 (six) hours as needed for nausea or vomiting., Disp: 30 tablet, Rfl: 0   sildenafil (REVATIO) 20 MG tablet, 2-5 tablets one hour prior to sexual  intimacy, Disp: , Rfl:    tamsulosin (FLOMAX) 0.4 MG CAPS capsule, Take 0.4 mg by mouth daily., Disp: , Rfl:    triamcinolone cream (KENALOG) 0.1 %, Apply topically., Disp: , Rfl:       Objective:   Vitals:   08/09/21 1430  BP: 122/80  Pulse: 74  Temp: 98.1 F (36.7 C)  TempSrc: Oral  SpO2: 100%  Weight: 212 lb 12.8 oz (96.5 kg)  Height: _0  (1.778 m)    Estimated body mass index is 30.53 kg/m as calculated from the following:   Height as of this encounter: _1  (1.778 m).   Weight as of this encounter: 212 lb 12.8 oz (96.5 kg).  _2 @  Filed Weights   08/09/21 1430  Weight: 212 lb 12.8 oz (96.5 kg)     Physical Exam    General: No distress. Looks well.  Mildly cushingoid Neuro: Alert and Oriented x 3. GCS 15. Speech normal Psych: Pleasant Resp:  Barrel Chest - no.  Wheeze - no, Crackles - yes at base, No overt respiratory distress CVS: Normal heart sounds. Murmurs - no Ext: Stigmata of Connective Tissue Disease -  no HEENT: Normal upper airway. PEERL +. No post nasal drip        Assessment:       ICD-10-CM   1. Interstitial pulmonary disease ([place])  J84.9 Ambulatory Referral for DME    Pulmonary function test    6 minute walk    SAR CoV2 Serology (COVID 19)AB(IGG)IA    SAR CoV2 Serology (COVID 19)AB(IGG)IA    2. 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 History of 2019 novel coronavirus disease (COVID-19)  Z86.16 SAR CoV2 Serology (COVID 19)AB(IGG)IA    SAR CoV2 Serology (COVID 19)AB(IGG)IA    4. Ground glass opacity present on imaging of lung  R91.8     5. Dyspnea on exertion  R06.09     6. Coronary artery calcification seen on CAT scan  I25.10 Ambulatory referral to Cardiology    7. Family history of early CAD  Z46.49     54. Need for immunization against influenza  Z23 Flu Vaccine QUAD High Dose(Fluad)     Possible the combination of COVID in April 2022 and subsequent chemotherapy has caused ILD.  Pulm eosinophilia is  normally responsive to steroids but in this case it does not.  Were not able to identify any other offending agents that can cause pulmonary eosinophilia.  To shorten interval to have developed opportunistic infection but this certainly possible.  Surgical lung biopsy can be somewhat risky although the value would be if it shows UIP and then we know it is a permanent diagnosis.  Given the lack of clear progression there is limited role for antifibrotic's.  At some point want her to take an intermediate approach of doing bronchoscopy with repeat lavage and make sure there is no opportunistic infection.  The biggest concern is that he is not improving.  This will have implications for his myeloma treatment in terms of choice of agents.  The other is that he is on chronic steroids and this can have side effects as well.  I am hesitant to do dapsone or Bactrim in the case they cause pulm eosinophilia.  And weight coming down in the steroid dose.  In order to make him feel better we will do a 6-minute walk test to see if he qualifies for portable oxygen.  We will have him start nighttime oxygen which he is willing to do.  Also make sure his heart is okay.  We will have Dr. Einar Gip evaluate him  I will reach out to Dr. Ovid Curd in [place] to see if he has any additional thoughts or is willing to see the patient.  I will see him back in the next 3-4 weeks with pulmonary function test.  Plan:     Patient Instructions     ICD-10-CM   1. Interstitial pulmonary disease ([place])  J84.9     2. 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 History of 2019 novel coronavirus disease (COVID-19)  Z86.16     4. Ground glass opacity present on imaging of lung  R91.8     5. Dyspnea on exertion  R06.09     6. Coronary artery calcification seen on CAT scan  I25.10     7. Family history of early CAD  Z82.49       Interstitial pulmonary disease ([place]) Drug-induced pneumonitis Ground glass opacity  present on imaging of lung Dyspnea on exertion  -Not getting better despite prednisone and being off chemo  Plan  -  For symptom relief start nighttime oxygen 2 L - Do 6-minute walk test to see if you qualify for portable oxygen - Continue to hold off on chemo - I have written to Dr. Virgel Manifold to see if he will give [place] an opinion or otherwise see you rather soon.  He is at [facility] -High-dose flu shot today -Check COVID IgG -based on on this week in time scheduled COVID booster mRNA -Continue prednisone taper per schedule - Holding off on dapsone for PCP prophylaxis given risk for eosinophilia with that -Do repeat spirometry and DLCO mid November 2022 -Repeat bronchoscopy for lavage versus surgical lung biopsy -is under consideration pending further opinion/discussions from Dr. Ovid Curd   Coronary artery calcification seen on CAT scan Family history of early CAD   -Doubt that current shortness of breath with exertion related to coronary artery disease but nevertheless a good idea to rule out  Plan - Refer Dr. Einar Gip [I will also message him]  Follow-up - Mid November 2022 but after breathing test [30-minute visit with Dr. [name]  ( Level 05 visit: Estb 40-54 min    in  visit type: on-site physical face to visit  in total care time and counseling or/and coordination of care by this undersigned MD - Dr Brand Males. This includes one or more of the following on this same day 08/09/2021: pre-charting, chart review, note writing, documentation discussion of test results, diagnostic or treatment recommendations, prognosis, risks and benefits of management options, instructions, education, compliance or risk-factor reduction. It excludes time spent by the [facility] or office staff in the care of the patient. Actual time 39 min)    SIGNATURE    Dr. Brand Males, M.D., F.C.C.P,  Pulmonary and Critical Care Medicine Staff Physician, [address] Director -  Interstitial Lung Disease  Program  P[facility] [place] [facility] [place], [place], [address]  Pager: [phone], If no answer or between  15:00h - 7:00h: call [phone] Telephone: [phone]  5:40 PM 08/09/2021

## 2021-08-09 NOTE — Telephone Encounter (Signed)
I will set up an appointment to see me soon. [place] please arrange.

## 2021-08-09 NOTE — Progress Notes (Signed)
IOV 10/04/2011  65 year old male. Allergies and possible asthma. Hypothyroidism. Anxiety state but no formal diagnosis. Has history of claustrophobia as well Non-smoker. Music therapist. Dad with CAD - s/p CABG at age 46 (now 89y)  Referred by Dr Donneta Romberg. Reports dyspnea. INsidious onset "all my life". Increased after starting allergy shots in June 2012. Says during hikes after he gets going it gets better. Same in gym. Notices more when he is starting out or stationary at rest. HE is not sure what it is.  Feels he needs to take a deep breath on occasion. So, now referred to cardiology and pulmonary. Says ICS Qvar in august/sept 2012 made it worse. Outside chart mentions asthma but patient says not sure if he has asthma. But reports childhood hx of asthma diagnosed by a pediatrician at age 33. Says he had similar symptoms.  Up until 1990 used to 10K but even back then had similar symptoms and would need to warm up before feeling better. Then stopped running due to neck problems. Similar during swimming exercise in 1996-1997. Quit swimming 1999. Episodes associated with wheezing but no cough. Unclear if albuterol helps but on xopenex prn esp before walking. This whole thing feels like he is not getting "enough juice". Never had PFT but recollects normal spirometry at Dr Rush [place] office.  He has seen Dr Gwendel Hanson cardiology for above - treadmill and echo and carotid doppler all pending. Current pulmonary modulating drug is Singulair only  Chest x-ray 07/16/2011 is clear per my personal review.  xxxxxxxxxxxxxxxxxxxxxxxxxxxxxxxxxxxxxxxxxxxxxxxxxxxxxxxxxxxxxxxxxx  Inpatient consult 06/16/21 65 y/o male with a history of multiple myeloma diagnosed in 2022, currently undergoing treatment presented to the [facility] emergency department from his hematology oncology appointment with a chief complaint of dyspnea.  The patient smoked cigarettes briefly as a young adult but since then has lived a relatively healthy  lifestyle staying active with exercise and playing golf.  He was diagnosed with multiple myeloma in the spring 2022 when a bruise was discovered while he was receiving a sports massage.  He went for further work-up and was found to have abnormal lab work, this led to a bone marrow biopsy confirming diagnosis of multiple myeloma.  He has been followed by Dr. [name] who has been treating him with VRd chemotherapy since Mar 16, 2021.  He is currently in the middle of his fifth cycle.     Over the last several weeks he has been developing progressive shortness of breath with exertion.  He says this has been going on for about 3 to 4 weeks and has significantly worsened in the last week.  This is associated with a dry, rare cough.  He denies fevers, chills, leg swelling, weight gain, or mucus production.  In the last several days (just prior to admission) [place] the [place] and while there felt significant shortness of breath so he presented to Dr. Libby Maw office for further evaluation.  He was sent to the emergency room for evaluation further because of the severity of his shortness of breath.  In the emergency department he was noted to have hypoxemia to an O2 saturation of 87% and an abnormal chest exam.  Pulmonary and critical care medicine was consulted for further evaluation.  He says that throughout his adult life he has had what he calls "huffing" when he talks while exerting himself or with exertion.  He says he has been followed by an allergist and has been treated for asthma at times over the  years.  He does not regularly use an inhaler and he says this is not something that he routinely needs.  He was seen by my partner Dr. [name] at 1 point in 2013 during which time he had a normal chest x-ray and normal lung function testing.   He had COVID in April 2022.   June 15, 2021 admission August 26 CT chest images independently reviewed: No pulmonary embolism, diffuse interlobular septal  thickening, pleural nodularity noted, areas of groundglass opacification with some patchy distribution particularly in the lung base, no significant pleural effusion, mild reactive appearing lymphadenopathy in the mediastinum Acute respiratory failure with hypoxemia in the setting of diffuse parenchymal lung disease of undetermined etiology: This is somewhat of a complex case given his underlying asthma and the fact that he had COVID in 2022 and in April 2022 a CT scan of his abdomen showed some ill-defined interstitial changes in the periphery of his lungs on lung windows in the bases.  Based on the time course of his illness and reports in the literature I am most concerned about [name] lung toxicity (see citation below), though it is also important to evaluate for another underlying and progressive primary pulmonary ILD or less likely an opportunistic infection (history doesn't seem to support this).  [name] can cause eosinophilic pneumonia which we can assess for with a bronchoscopy, but his imaging characteristics aren't typical for this.  It is possible that the non-specific interstitial changes seen on his lungs in April 2022 were related to his recent COVID infection.  I don't think his baseline asthma explains his symptoms, though he did have a high serum eosinophil count a few weeks prior to admission (unclear significance).  Saglam B, Paulita Fujita M, Ornek S, Keske S, Tabak L, Cakar N, Zeren H, Aytekin S, [place], Ferhanoglu B. Bortezomib induced pulmonary toxicity: a case report and review of the literature. Am J Blood Res. 2020 Dec 15;10(6):407-415. PMID: 34196222; PMCID: [id number].  06/18/21 -   Results for GARL, SPEIGNER" (MRN [id number]) as of 07/12/2021 09:13  Ref. Range 06/18/2021 12:57  Monocyte-Macrophage-Serous Fluid Latest Ref Range: 50 - 90 % 5 (L)  Other Cells, Fluid Latest Units: % CORRELATE WITH CYTOLOGY.  Color, Fluid Latest Ref Range: YELLOW  PINK (A)  Total  Nucleated Cell Count, Fluid Latest Ref Range: 0 - 1,000 cu mm 103  Fluid Type-FCT Unknown BRONCHIAL ALVEOLAR LAVAGE  Lymphs, Fluid Latest Units: % 18  Eos, Fluid Latest Units: % 51  Appearance, Fluid Latest Ref Range: CLEAR  HAZY (A)  Neutrophil Count, Fluid Latest Ref Range: 0 - 25 % 26 (H)  FINAL MICROSCOPIC DIAGNOSIS:  - No malignant cells identified  - Benign bronchial cells and pulmonary macrophages   Titrate off solumedrol and start prednisone 50 mg daily. Decrease by 10 mg weekly    OV 07/12/2021  Subjective:  Patient ID: Billee Cashing, male , DOB: [birth year] , age 62 y.o. , MRN: [id number] , ADDRESS: [place] [id number] PCP Antony Contras, MD Patient Care Team: Antony Contras, MD as PCP - General (Family Medicine)  This Provider for this visit: Treatment Team:  Attending Provider: Brand Males, MD    07/12/2021 -   Chief Complaint  Patient presents with   Hospitalization Follow-up    Pt states he was doing better after being out of the hospital after being placed on steroids. States about a week after being out, states he started having more problems with SOB and  to today, he has had problems with SOB with exertion.   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 -presents for follow-up from the hospital.  I originally met him 10 years ago and then at that point in time he had dyspnea.  He tells me that after that he was exercising and living a good life.  Then in April 2022 got diagnosed incidentally with myeloma multiple.  Says he was caught early.  Then in May 2022 started on [name] and [name].  He says he was doing well with this up until mid July.  Up until then he was walking 1-1/2 2 miles per day 5 days a week.  Then in late July 2022 started noticing shortness of breath.  He met Dr. [name] his oncologist on May 25, 2021.  By June 01, 2021 he had severe pulmonary  infiltrates.  He says a part of then he was getting his [name] once a week along with some steroids with each dose.  At this point in time the steroid dose was cut down but he progressed and started getting worse.  Then on June 15, 2021 he was in significant respiratory distress and admitted to the hospital.  I reviewed the hospital records.  He was there for 3 days.  He underwent bronchoscopy with BAL there is also 51% eosinophilia.  Cultures are negative malignant cells are negative.  He was seen by my pulmonary colleagues.  Discussion was held with the [name] other [name] was the etiology.  Given the use and failure it looks like his [name] has been held.  He was discharged on 50 mg prednisone with advised to taper it by 10 mg/week.  Currently is on 20 mg prednisone.  He started playing golf again and feeling really good through Labor Day weekend..  Then on June 29, 2021 his [name] was not given.  Up until this point all chemo was held.  He was given Cytoxan, Zomenta and [name].  The very next in June 30, 2021 he started feeling worse.  He feels [name] is the etiology for this.  On July 06, 2021 he communicated this with Dr. [name] and all chemo has been held.  He is being referred to the bone marrow transplant program at [facility] for further evaluation.  At this point in time he says he is much better than when he was in the hospital but definitely not as good as he was prior to getting a rechallenge with [name] on June 29, 2021.  But he notices that he slowly improving.  Walking desaturation test shows that his pulse ox is holding up although he does have a tendency to desaturate.   Did extensive review of the literature.  According to up-to-date [name] is a known etiology for pulm eosinophilia but he got worse after getting rechallenged with [name].  Review of the literature shows that [name] can cause drug-induced pneumonitis and a small fraction of patients.  The  literature is  NO INFOR  as to whether these patients have eosinophilia or not but definitely they seem to be steroid responsive.  This literature is attached below.  In the timeframe also fits in with [name].  He is also noticed to be on allopurinol which rarely can cause hypersensitivity syndrome or dermatitis, hepatitis and eosinophilia - ADDRESS Syndrome but he did not have thse features of rash etc     Nevertheless significant eosinophilia in the BAL 51%  suggest drug-induced pneumonitis.  He currently has significant symptoms of shortness of breath.  He does not have oxygen with him at home.  Simple office walk 185 feet x  3 laps goal with forehead probe 07/12/2021    O2 used ra   Number laps completed 3   Comments about pace slow   Resting Pulse Ox/HR 100% and 69/min   Final Pulse Ox/HR 92% and 81/min   Desaturated </= 88% no   Desaturated <= 3% points Yes 8   Got Tachycardic >/= 90/min no   Symptoms at end of test No complaints   Miscellaneous comments x        Causes of Pulmonary Eosinophilia: from UPTODATE A. Nonsteroidal antiinflammatory drugs (NSAIDs) B. Nitrofurantoin  C. Minocycline D. Sulfonamides/ Sulfamethoxazole E. Ampicillin F. Daptomycin G.Vancomycin H. Dapsone I. Beta-lactam antibiotics J. Nevirapine K. Telaprevir L. Sulfasalazine M. Methotrexate N. Mesalamine O. Amiodarone P. Bleomycin Q. ACE Inhibitor R. Beta blocker S. Hydrochlorothiazides T. L- Tryptophan U. Allopurinol V. Carbamazepine W. Lamotrigine X. Phenytoin Y. Phenindione Z. Fluindione AA. Olanzapine AB. Oxcarbazepine AC. Strontium Ranelate AD. Lenalidomide AE. Radiographic contrast media  Saglam B, Kalyon H, Ozbalak M, Ornek S, Keske S, Tabak L, Cakar N, Zeren H, Aytekin S, [place], [place] B. Bortezomib induced pulmonary toxicity: a case report and review of the literature. Am J Blood Res. 2020 Dec 15;10(6):407-415. PMID: 71696789; PMCID: [id number]. - Bortezomib  related pulmonary toxicities are rarely reported. Although the incidence of Bortezomib induced lung injury (BLI) is unknown, in a large registry study of 1010 MM patients, 45 patients were reported to have BLI by their physician. However, the causality could only be constituted in 26 patients (2.6%), with 5 of them resulting in death despite steroid treatment.In the case reports, the average number of RVD cycles until the toxicity was presented was 6.9, and the period between the development of pulmonary toxicity and the first dose of Bortezomib was 31.1 days -No mention of eosinophilia  Bortezomib therapy-related lung disease in [place] patients with multiple myeloma: Incidence, mortality and clinical characterization Dyann Ruddle [facility] [place] Miyao,2 [place] Masahiko Kusumoto,5 [place] Fumikazu HENID,7 [place] Sugiyama,8 [place] Hatake,9 [place] Pao Fukuda,10 and [place] patients registered, 27 (4.5%) developed BILD, 5 (0.50%) of whom had fatal cases. The median time to BILD onset from the first bortezomib dose was 14.5 days, and most of the patients responded well to corticosteroid therapy. A retrospective review by the Lung Injury Medical Expert Panel revealed that the types with capillary leak syndrome and hypoxia without infiltrative shadows were uniquely and frequently observed in patients with BIL - no mention of eosinophilia  CellularOperator.fi =- allopurinol complex multisystem Hypersensitivityey   has a past medical history of Asthma, High cholesterol, Perennial allergic rhinitis, Sciatic pain, right, Seasonal allergic rhinitis, and Thyroid disease.   reports that he quit smoki     07/20/2021  - Visit, NP [name]    65 year old male former smoker followed in our office for shortness of breath and interstitial lung disease.  Established with Dr. [name].  Initially  consulted when inpatient with our team in August/2022.  Chief complaint at that point time was dyspnea.  He was diagnosed with multiple myeloma in the spring 2022 when a bruise was discovered while he was recovering from a sports massage.  He went for further work-up and he was found to have abnormal lab work this led to a bone marrow biopsy confirming  diagnosis of multiple myeloma.  He has been followed by Dr. [name].  He was in the middle of treating him with VRD chemotherapy since May/20 04/2021.  He was in the middle of his fifth cycle.  Patient also had [place] in April/2022.  Chest CT in April/2022 showed ill-defined interstitial changes in the periphery.  Concern for [name] lung toxicity.  Patient also had a bronchoscopy performed in 06/18/2021.  Eosinophils 51%, lymphs 18%, no malignant cells identified patient was started on a long-term prednisone taper.  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  Still currently having significant symptoms of shortness of breath.    Last seen in our office on 07/12/2021 by Dr. [name].  Plan of care at that office visit was as follows: Stop [name], check chest x-ray, check CBC with differential, blood ESR, blood IgE, vitamin D, hemoglobin A1c, G6PD, walk test, check Ono at home, change prednisone to 50 mg daily for 2 weeks, then 40 mg daily for 2 weeks, then 30 mg daily for 2 weeks, then 20 mg daily for 2 weeks, 2-week follow-up with APP or Dr. [name], 4-week follow-up with Dr. [name] and 30-minute time slot.  Per chart review on 07/13/2021 [name] was discontinued.  Patient was also seen by Dr. [name] on 07/18/2021 I am unable to view this note.  Patient reporting that weight team would like for him to resume [name] at a maintenance dose of 10 mg.  This is the current plan.  He will see oncology next Friday for blood work.  Patient is scheduled to complete an overnight oximetry test next Tuesday.  He remains  adherent to his prednisone taper 50 mg daily.  Patient reports that this weekend he did play 9 holes of golf on Saturday and on Sunday.  He was limited by his physical exertion.  He also exercised on Tuesday and worked out with a Clinical research associate.  Patient is eager to get back to baseline physical activity.  Patient reports that on Wednesday (07/18/2021) of this week he had increased shortness of breath, cough, worsened acid reflux and he vomited.  Patient believes that this may also be due to the fact that he ate a dinner meal quite quickly.  This sometimes happens when he does this.  He also reports that his blood pressure was high at that time.  Patient reports that he has been off the [name] for at least 1 month.  Patient has stopped his allopurinol as of 07/18/2021.  Patient and spouse are both frustrated regarding dyspnea and have hopes that he would be improving quicker.  There are also concerns that he may have acute worsened symptoms or an acute infection such as bronchitis.  We will discuss and evaluate for this today.  Walk today in office: 07/20/2021-completed 2 laps on room air, dropped to 93%  07/26/2021- Interim hx  Patient presents today for 1 week follow-up. ILD felt to be related to drug pneumonitis from [name] as well as [name] for his treatment of multiple myeloma. BAL showed significant eosinophilia. Dr. [name] lowered dose of [name] to 29m daily. CXR on 07/20/21 showed chronic ILD, no definite acute findings. Ambulatory walk during his last visit showed no oxygen desaturations. During his last visit he was ordered for HRCT, PFTs and ONO.   Accompanied by his wife. He had a bad weekend, his respiratory symptoms have been slightly better the last two days. He is currently off BOTH [name] and [name] (may retry [name] at lower  dose). He stopped using BREO d/t throat irritation. Xanax has helped relieves some anxiety and chest tightness. Wife reports that he is sleeping better. He in  on prolonged prednisone taper. He will be starting 42m prednisone tomorrow  x 2 weeks. He is taking Singulair and generic fluticasone nasal spray. He has been off allergy shots since August. HRCT and PFTs are scheduled for next week. Awaiting results to be faxed for ONO from [employer].    OV 08/02/2021 -   Subjective:  Patient ID: VBillee Cashing male , DOB: 1December 22, 1957, age 65y.o. , MRN: [id number], [id number] SAntony Contras MD Patient Care Team: SAntony Contras MD as PCP - General (Family Medicine)  This Provider for this visit: Treatment Team:  Attending Provider: RBrand Males MD  Type of visit: Telephone/Video Circumstance: COVID-19 national emergency Identification of patient VGONSALO CUTHBERTSONwith [id number] MRN [id number]- 2 person identifier Risks: Risks, benefits, limitations of telephone visit explained. 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 CT abdomen/pelvis: Multiple small lytic lesions in the thoracolumbar spine and bilateral pelvis -SPEP: IgG 2,082 (H), M Protein 1.8 (H). IFE shows IgG monoclonal protein with kappa light chain specificity.  -LDH 169, CBC normal, CMP normal except for sodium 131 (L), Chloride 96 (L).   02/14/2021: Establish care with IDede QueryPA-C 02/22/2021: bone marrow biopsy confirms the diagnosis of Multiple Myeloma with a monoclonal plasma cell population.  03/16/2021: Cycle 1 Day 1 of VRd chemotherapy  04/06/2021: Cycle 2 Day 1 of VRd chemotherapy  04/27/2021: Cycle 3 Day 1 of VRd chemotherapy  05/18/2021: Cycle 4 Day 1 of VRd chemotherapy  06/01/2021: drop  dexamethasone to 265mPO weekly and start lasix due to shortness of breath.  06/15/2021: Desaturation to 87% on ambulation. HELD velcade today and sent to ED for evaluation.  06/22/2021: Findings consistent with drug reaction the lungs with eosinophils on BAL.  Given these findings we will definitely hold [name] and plan to avoid pomalidomide 06/29/2021: Cycle 1 Day 1 CyBorD chemotherapy   HPI ViQuinto Tippyosco 89.o. -there is a telephone visit.  He is supposed to see me next week but he had a CT scan of the chest 2 days ago and had pulmonary function test today.  He really wanted to discuss the results today itself.  Review of the records indicate that he still on prednisone taper.  He is able to ambulate and desaturated only after 200 feet.  There are some mild nocturnal desaturation.  We started 2 L of nasal cannula oxygen.  His CT scan of the chest shows diffuse groundglass opacity in a pattern that is inconsistent with UIP [less than 40% chance this is UIP] suggestive of alternate pattern.  He says he is able to do weight training exercises well but when he walks on a treadmill or does ambulation that is when it bothers him.  When he rests he is better.  He does desaturate to 80s percent.  He is wondering if this could be from asthma.  I told him otherwise.  Touch base with Dr. DoLorenso Courieris oncologist.  He is scheduled for cyclophosphamide  tomorrow along with low-dose [name].  This was held off recently in September 2022.  But oncology is wanting to rechallenge him with low-dose [name].  They wanted approval for this.   ONO RA 07/24/21   - - 45 min spent <88%. 7+ hours was > 90%. OVerall not bad  Plan  Start 2L Branch QHS  Results for JAIVION, KINGSLEY" (MRN [id number]) as of 08/02/2021 12:16  Ref. Range 08/02/2021  08/02/2021   FVC-Pre Latest Units: L 1.55   FVC-%Pred-Pre Latest Units: % 33   Results for DURELLE, ZEPEDA" (MRN [id number]) as of 08/02/2021 12:16  Ref. Range  08/02/2021    DLCO cor Latest Units: ml/min/mmHg 12.52   DLCO cor % pred Latest Units: % 46   Results for MOE, GRACA" (MRN [id number]) as of 08/02/2021 12:16  Ref. Range 08/02/2021    TLC Latest Units: L 4.18   TLC % pred Latest Units: % 59    Results for CLABORN, JANUSZ" (MRN [id number]) as of 08/02/2021 12:16  Ref. Range 07/12/2021 09:54  G-6PDH Latest Ref Range: 7.0 - 20.5 U/g Hgb 14.8   CT Chest data  CT Chest High Resolution  Result Date: 07/31/2021 CLINICAL DATA:  Evaluate for interstitial lung disease EXAM: CT CHEST WITHOUT CONTRAST TECHNIQUE: Multidetector CT imaging of the chest was performed following the standard protocol without intravenous contrast. High resolution imaging of the lungs, as well as inspiratory and expiratory imaging, was performed. COMPARISON:  Chest CT dated June 15, 2021; abdomen and pelvis CT dated February 02, 2021 FINDINGS: Cardiovascular: Cardiomegaly with trace pericardial effusion. Coronary artery calcifications of the RCA and LAD. Atherosclerotic disease of the thoracic aorta. Mediastinum/Nodes: Esophagus is unremarkable. Atrophic thyroid. Mediastinal lymph nodes are decreased in size when compared with prior exam. Reference AP window lymph node on series 2, image 42 measures 1.1 cm in short axis, previously 1.3 cm. Lungs/Pleura: Central airways are patent. Images are motion degraded. Mild diffuse ground-glass opacity with peribronchovascular and subpleural reticular glass opacities and traction bronchiectasis. No clear craniocaudal predominance. Mild bilateral air trapping. 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 Stable solid right middle lobe pulmonary nodule measuring 3 mm on series 3, image 57. Upper Abdomen: No acute abnormality. Musculoskeletal: No chest wall mass or suspicious bone lesions identified. IMPRESSION: Limited evaluation due to respiratory motion artifact. Within limitations, there are diffuse bilateral  ground-glass opacities with peribronchovascular and subpleural reticular opacities, traction bronchiectasis and no clear craniocaudal predominance. Differential considerations include sequela of acute lung injury, NSIP, or fibrotic HP given presence of air trapping. Mild subpleural reticular opacities were present on visualized portions of the lung on prior abdomen and pelvis CT dated February 02, 2021, although majority of findings are new. Findings are suggestive of an alternative diagnosis (not UIP) per consensus guidelines: Diagnosis of Idiopathic Pulmonary Fibrosis: An Official ATS/ERS/JRS/ALAT Clinical Practice Guideline. [place], Iss 5, [id number], Jun 21 2017. Small solid pulmonary nodule the right middle lobe measuring 3 mm. No follow-up needed if patient is low-risk. Non-contrast chest CT can be considered in 12 months if patient is high-risk. This recommendation follows the consensus statement: Guidelines for Management of Incidental Pulmonary Nodules Detected on CT Images: From the Fleischner Society 2017; Radiology 2017; 284:228-243. Aortic Atherosclerosis (ICD10-I70.0). Electronically Signed   By: Yetta Glassman M.D.   On: 07/31/2021 12:01          OV 08/09/2021  Subjective:  Patient ID: Evette Doffing  Marella Chimes, male , DOB: Apr 14, 1956 , age 71 y.o. , MRN: [id number] , ADDRESS: [place] [id number] PCP Antony Contras, MD Patient Care Team: Antony Contras, MD as PCP - General (Family Medicine)  This Provider for this visit: Treatment Team:  Attending Provider: Brand Males, MD    08/09/2021 -   Chief Complaint  Patient presents with   Follow-up    Pt states he is about the same since last visit, maybe a little better. Pt is coughing more at times and states he does cough a lot before bed.   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  -returns for follow-up.  He is finishing up 40 mg of prednisone per day.  Restarting 30 mg/day of prednisone tomorrow.  He is not really better.  Today he was able to only walk 2 out of the customary 3 laps.  And he showed a tendency to desaturate.  He says he is extremely anxious.  This is understandable.  He also states that when he lifts weights in the gym he does not have a problem but when he exerts himself he feels worse.  Previously his nocturnal desaturation test showed abnormality and we recommended night oxygen but he declined per the CMA.  But today he and his wife tell me that they would be interested in getting some oxygen if it would help with shortness of breath.  Simple walking desaturation test does not make him qualify and will need a 6-minute walk test to qualify for portable oxygen.  They are also worried about the lack of improvement.  They are wondering about second opinion.  I did unofficially check with some of my colleagues in the [place].  No clear-cut plan has been developed.  We discussed about the possibility of visiting Dr. Virgel Manifold, ILD group in [place].  They seemed enthused about the idea but wanted me to reach out to Dr. Ovid Curd first.  In terms of his myeloma review of the medical records from office visit 08/03/2021 with Dr. [name] and talking to the patient and the labs show the myeloma still in remission.  But Dr. [name] is extremely concerned that the myeloma will come back.  Oncology still wants to rechallenge him with [name] but after my personal discussion with Dr. [name] on 08/02/2021 and concern for pulmonary toxicity chemotherapy is on hold till patient recovers.  He is also concerned about the presence of coronary artery calcification on his recent CT scan of the chest.  His dad had coronary artery disease diagnosed at 67 and had bypass.  He wants to visit this with Dr. Einar Gip again     SYMPTOM SCALE - ILD 08/09/2021  Current weight   O2 use  ra  Shortness of Breath 0 -> 5 scale with 5 being worst (score 6 If unable to do)  At rest 0  Simple tasks - showers, clothes change, eating, shaving 2  Household (dishes, doing bed, laundry) 4  Shopping 3  Walking level at own pace 4  Walking up Stairs 5  Total (30-36) Dyspnea Score 18  How bad is your cough? 3  How bad is your fatigue 0  How bad is nausea 0  How bad is vomiting?  0  How bad is diarrhea? 0  How bad is anxiety? 5  How bad is depression 2  Any chronic pain - if so where and how bad  x       Simple office walk 185 feet x  3 laps goal with forehead probe 07/12/2021  08/09/2021   O2 used ra Ra3  Number laps completed 3 3 but did oly 2  Comments about pace slow slow  Resting Pulse Ox/HR 100% and 69/min 100%ad 74  Final Pulse Ox/HR 92% and 81/min 93% and 87  Desaturated </= 88% no no  Desaturated <= 3% points Yes 8 Yes, 7 pponts  Got Tachycardic >/= 90/min no no  Symptoms at end of test No complaints Mod-severe dyspnea  Miscellaneous comments x Worse?        PFT  PFT Results Latest Ref Rng & Units 06/15/2021  FVC-Pre L 1.55  FVC-Predicted Pre % 33  FVC-Post L 1.45  FVC-Predicted Post % 31  Pre FEV1/FVC % % 83  Post FEV1/FCV % % 83  FEV1-Pre L 1.28  FEV1-Predicted Pre % 37  FEV1-Post L 1.20  DLCO uncorrected ml/min/mmHg 12.00  DLCO UNC% % 44  DLCO corrected ml/min/mmHg 12.52  DLCO COR %Predicted % 46  DLVA Predicted % 118  TLC L 4.18  TLC % Predicted % 59  RV % Predicted % 95       has a past medical history of Asthma, High cholesterol, Perennial allergic rhinitis, Sciatic pain, right, Seasonal allergic rhinitis, and Thyroid disease.   reports that he quit smoking about 38 years ago. His smoking use included cigarettes. He has a 0.30 pack-year smoking history. He has never used smokeless tobacco.  Past Surgical History:  Procedure Laterality Date   BRONCHIAL BIOPSY  06/18/2021   Procedure: BRONCHIAL BIOPSIES;  Surgeon: Collene Gobble,  MD;  Location: [facility];  Service: Cardiopulmonary;;   BRONCHIAL WASHINGS  06/18/2021   Procedure: BRONCHIAL WASHINGS;  Surgeon: Collene Gobble, MD;  Location: [facility];  Service: Cardiopulmonary;;   NASAL SINUS SURGERY     NECK SURGERY  1996   VIDEO BRONCHOSCOPY N/A 06/18/2021   Procedure: VIDEO BRONCHOSCOPY WITH FLUORO;  Surgeon: Collene Gobble, MD;  Location: [facility];  Service: Cardiopulmonary;  Laterality: N/A;    Allergies  Allergen Reactions   Clarithromycin    Penicillins     Immunization History  Administered Date(s) Administered   Fluad Quad(high Dose 65+) 08/09/2021   Influenza Split 10/27/2017   Influenza, High Dose Seasonal PF 12/01/2017, 11/29/2019, 12/19/2020   Influenza, Quadrivalent, Recombinant, Inj, Pf 07/28/2019, 08/11/2020   Influenza-Unspecified 11/26/2011, 07/21/2017   PFIZER(Purple Top)SARS-COV-2 Vaccination 12/27/2019, 01/24/2020, 09/21/2020   Tdap 08/20/2005, 09/07/2015   Zoster, Live 11/06/2017, 05/07/2018    Family History  Problem Relation Age of Onset   Allergies Father    Allergies Mother    Hypertension Other    Heart disease Other    Breast cancer Paternal Grandmother      Current Outpatient Medications:    acyclovir (ZOVIRAX) 400 MG tablet, Take 1 tablet (400 mg total) by mouth 2 (two) times daily., Disp: 60 tablet, Rfl: 3   albuterol (VENTOLIN HFA) 108 (90 Base) MCG/ACT inhaler, Inhale 2 puffs into the lungs every 6 hours as needed for wheezing or shortness of breath., Disp: 8.5 g, Rfl: 1   allopurinol (ZYLOPRIM) 300 MG tablet, Take 300 mg by mouth daily., Disp: , Rfl:    ALPRAZolam (XANAX) 1 MG tablet, Take 1 mg by mouth 2 (two) times daily as needed for anxiety., Disp: , Rfl:    aspirin EC 81 MG tablet, Take 81 mg by mouth daily. Swallow whole., Disp: , Rfl:  ASSESS FULL RANGE PEAK METER DEVI, as directed., Disp: , Rfl:    cholecalciferol (VITAMIN D) 1000 UNITS tablet, Take 3,000 Units by mouth daily., Disp: , Rfl:     EPINEPHrine (EPI-PEN) 0.3 mg/0.3 mL DEVI, Inject 0.3 mg into the muscle once as needed., Disp: , Rfl:    ezetimibe-simvastatin (VYTORIN) 10-40 MG per tablet, Take 1 tablet by mouth at bedtime., Disp: , Rfl:    fluticasone-salmeterol (ADVAIR HFA) 230-21 MCG/ACT inhaler, Inhale 2 puffs into the lungs 2 (two) times daily., Disp: 1 each, Rfl: 12   furosemide (LASIX) 20 MG tablet, Take 1 tablet (20 mg total) by mouth as needed for fluid or edema., Disp: 14 tablet, Rfl: 1   lenalidomide ([name]) 10 MG capsule, Take 1 capsule (10 mg total) by mouth daily. [employer] Auth # [id number]   Date Obtained 07/19/21  Take 1 capsule daily x 14 days, none for 7 days, Disp: 14 capsule, Rfl: 0   levothyroxine (SYNTHROID) 150 MCG tablet, Take 150 mcg by mouth every other day. Alternates with 175 mcg, Disp: , Rfl:    levothyroxine (SYNTHROID) 175 MCG tablet, Take 1 tablet by mouth every other day., Disp: , Rfl:    montelukast (SINGULAIR) 10 MG tablet, Take 10 mg by mouth at bedtime., Disp: , Rfl:    Multiple Vitamin (MULTIVITAMIN) tablet, Take 1 tablet by mouth daily., Disp: , Rfl:    omeprazole (PRILOSEC) 40 MG capsule, Take 1 capsule by mouth daily., Disp: , Rfl:    ondansetron (ZOFRAN) 8 MG tablet, Take 1 tablet (8 mg total) by mouth every 8 (eight) hours as needed for nausea or vomiting., Disp: 30 tablet, Rfl: 0   predniSONE (DELTASONE) 10 MG tablet, Take 5 tablets (50 mg total) by mouth daily with breakfast for 14 days, THEN 4 tablets (40 mg total) daily with breakfast for 14 days, THEN 3 tablets (30 mg total) daily with breakfast for 14 days, THEN 2 tablets (20 mg total) daily with breakfast for 14 days. Once you reach 55m, remain on 274muntil further notice.., Disp: 196 tablet, Rfl: 0   prochlorperazine (COMPAZINE) 10 MG tablet, Take 1 tablet (10 mg total) by mouth every 6 (six) hours as needed for nausea or vomiting., Disp: 30 tablet, Rfl: 0   sildenafil (REVATIO) 20 MG tablet, 2-5 tablets one hour prior to sexual  intimacy, Disp: , Rfl:    tamsulosin (FLOMAX) 0.4 MG CAPS capsule, Take 0.4 mg by mouth daily., Disp: , Rfl:    triamcinolone cream (KENALOG) 0.1 %, Apply topically., Disp: , Rfl:       Objective:   Vitals:   08/09/21 1430  BP: 122/80  Pulse: 74  Temp: 98.1 F (36.7 C)  TempSrc: Oral  SpO2: 100%  Weight: 212 lb 12.8 oz (96.5 kg)  Height: _0  (1.778 m)    Estimated body mass index is 30.53 kg/m as calculated from the following:   Height as of this encounter: _1  (1.778 m).   Weight as of this encounter: 212 lb 12.8 oz (96.5 kg).  _2 @  Filed Weights   08/09/21 1430  Weight: 212 lb 12.8 oz (96.5 kg)     Physical Exam    General: No distress. Looks well.  Mildly cushingoid Neuro: Alert and Oriented x 3. GCS 15. Speech normal Psych: Pleasant Resp:  Barrel Chest - no.  Wheeze - no, Crackles - yes at base, No overt respiratory distress CVS: Normal heart sounds. Murmurs - no Ext: Stigmata of Connective Tissue Disease -  no HEENT: Normal upper airway. PEERL +. No post nasal drip        Assessment:       ICD-10-CM   1. Interstitial pulmonary disease ([place])  J84.9 Ambulatory Referral for DME    Pulmonary function test    6 minute walk    SAR CoV2 Serology (COVID 19)AB(IGG)IA    SAR CoV2 Serology (COVID 19)AB(IGG)IA    2. 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 History of 2019 novel coronavirus disease (COVID-19)  Z86.16 SAR CoV2 Serology (COVID 19)AB(IGG)IA    SAR CoV2 Serology (COVID 19)AB(IGG)IA    4. Ground glass opacity present on imaging of lung  R91.8     5. Dyspnea on exertion  R06.09     6. Coronary artery calcification seen on CAT scan  I25.10 Ambulatory referral to Cardiology    7. Family history of early CAD  Z46.49     54. Need for immunization against influenza  Z23 Flu Vaccine QUAD High Dose(Fluad)     Possible the combination of COVID in April 2022 and subsequent chemotherapy has caused ILD.  Pulm eosinophilia is  normally responsive to steroids but in this case it does not.  Were not able to identify any other offending agents that can cause pulmonary eosinophilia.  To shorten interval to have developed opportunistic infection but this certainly possible.  Surgical lung biopsy can be somewhat risky although the value would be if it shows UIP and then we know it is a permanent diagnosis.  Given the lack of clear progression there is limited role for antifibrotic's.  At some point want her to take an intermediate approach of doing bronchoscopy with repeat lavage and make sure there is no opportunistic infection.  The biggest concern is that he is not improving.  This will have implications for his myeloma treatment in terms of choice of agents.  The other is that he is on chronic steroids and this can have side effects as well.  I am hesitant to do dapsone or Bactrim in the case they cause pulm eosinophilia.  And weight coming down in the steroid dose.  In order to make him feel better we will do a 6-minute walk test to see if he qualifies for portable oxygen.  We will have him start nighttime oxygen which he is willing to do.  Also make sure his heart is okay.  We will have Dr. Einar Gip evaluate him  I will reach out to Dr. Ovid Curd in [place] to see if he has any additional thoughts or is willing to see the patient.  I will see him back in the next 3-4 weeks with pulmonary function test.  Plan:     Patient Instructions     ICD-10-CM   1. Interstitial pulmonary disease ([place])  J84.9     2. 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 History of 2019 novel coronavirus disease (COVID-19)  Z86.16     4. Ground glass opacity present on imaging of lung  R91.8     5. Dyspnea on exertion  R06.09     6. Coronary artery calcification seen on CAT scan  I25.10     7. Family history of early CAD  Z82.49       Interstitial pulmonary disease ([place]) Drug-induced pneumonitis Ground glass opacity  present on imaging of lung Dyspnea on exertion  -Not getting better despite prednisone and being off chemo  Plan  -  For symptom relief start nighttime oxygen 2 L - Do 6-minute walk test to see if you qualify for portable oxygen - Continue to hold off on chemo - I have written to Dr. Virgel Manifold to see if he will give [place] an opinion or otherwise see you rather soon.  He is at [facility] -High-dose flu shot today -Check COVID IgG -based on on this week in time scheduled COVID booster mRNA -Continue prednisone taper per schedule - Holding off on dapsone for PCP prophylaxis given risk for eosinophilia with that -Do repeat spirometry and DLCO mid November 2022 -Repeat bronchoscopy for lavage versus surgical lung biopsy -is under consideration pending further opinion/discussions from Dr. Ovid Curd   Coronary artery calcification seen on CAT scan Family history of early CAD   -Doubt that current shortness of breath with exertion related to coronary artery disease but nevertheless a good idea to rule out  Plan - Refer Dr. Einar Gip [I will also message him]  Follow-up - Mid November 2022 but after breathing test [30-minute visit with Dr. [name]  ( Level 05 visit: Estb 40-54 min    in  visit type: on-site physical face to visit  in total care time and counseling or/and coordination of care by this undersigned MD - Dr Brand Males. This includes one or more of the following on this same day 08/09/2021: pre-charting, chart review, note writing, documentation discussion of test results, diagnostic or treatment recommendations, prognosis, risks and benefits of management options, instructions, education, compliance or risk-factor reduction. It excludes time spent by the [place] or office staff in the care of the patient. Actual time 39 min)    SIGNATURE    Dr. Brand Males, M.D., F.C.C.P,  Pulmonary and Critical Care Medicine Staff Physician, [address] Director -  Interstitial Lung Disease  Program  Pulmonary [place] at [place], [place], [address]  Pager: [phone], If no answer or between  15:00h - 7:00h: call [phone] Telephone: [phone]  5:40 PM 08/09/2021

## 2021-08-10 ENCOUNTER — Ambulatory Visit: Payer: Managed Care, Other (non HMO)

## 2021-08-10 ENCOUNTER — Telehealth: Payer: Self-pay | Admitting: Internal Medicine

## 2021-08-10 ENCOUNTER — Other Ambulatory Visit: Payer: Managed Care, Other (non HMO)

## 2021-08-10 ENCOUNTER — Ambulatory Visit (INDEPENDENT_AMBULATORY_CARE_PROVIDER_SITE_OTHER): Payer: Managed Care, Other (non HMO) | Admitting: Internal Medicine

## 2021-08-10 DIAGNOSIS — J189 Pneumonia, unspecified organism: Secondary | ICD-10-CM

## 2021-08-10 DIAGNOSIS — T50905A Adverse effect of unspecified drugs, medicaments and biological substances, initial encounter: Secondary | ICD-10-CM

## 2021-08-10 DIAGNOSIS — J849 Interstitial pulmonary disease, unspecified: Secondary | ICD-10-CM

## 2021-08-10 LAB — SARS-COV-2 ANTIBODY(IGG)SPIKE,SEMI-QUANTITATIVE: SARS COV1 AB(IGG)SPIKE,SEMI QN: >150 index — ABNORMAL HIGH (ref <1.00)

## 2021-08-10 NOTE — Telephone Encounter (Signed)
I called him but his wife was not there and he was not able to talk.  The ICU was busy.  Please just put him in for 430 and I will just call him sometime between 3 PM and 7 PM.  This will allow me to document my conversation with Dr. Ovid Curd that I had earlier today

## 2021-08-10 NOTE — Telephone Encounter (Signed)
Samuel [name], can you open slot for MR today at 4:30 for televisit with pt please.

## 2021-08-10 NOTE — Telephone Encounter (Signed)
Attempted to call pt to schedule him for a televisit with Dr. [name] but unable to reach. Left message for him to return call so we could get him scheduled for a televisit with Dr. [name] today 10/21 to discuss results/conversation that he had with Dr. Ovid Curd.  Routing back to MR.

## 2021-08-10 NOTE — Telephone Encounter (Signed)
Pls book a telephone visit for 08/10/2021 - let him know I am in ICU till 7p and my goal is to call him/wife with results of conversation wwith Dr Sueanne Margarita tht I just had  Thanks    SIGNATURE    Dr. Brand Males, M.D., F.C.C.P,  Pulmonary and Critical Care Medicine Staff Physician, [place] Director - Interstitial Lung Disease  Program  Pulmonary [place] at [place], [place], [address]  NPI Number:  NPI #[id number]  Pager: [phone], If no answer  -> Check AMION or Try [phone] Telephone (clinical office): [phone] Telephone (research): [phone]  11:18 AM 08/10/2021

## 2021-08-10 NOTE — Telephone Encounter (Signed)
Please advise what time you want [place] to add pt on for a televisit as pt is asking for a specific time. Know that you are currently working in the ICU

## 2021-08-10 NOTE — Telephone Encounter (Signed)
Patient is scheduled and checked in for televisit.

## 2021-08-10 NOTE — Progress Notes (Signed)
Type of visit: Telephone/Video Circumstance: COVID-19 national emergency Identification of patient Samuel [name] with September 02, 1956 and MRN [id number] - 2 person identifier Risks: Risks, benefits, limitations of telephone visit explained. Patient understood and verbalized agreement to proceed Anyone else on call: wife  Patient location: his cell This provider location: [facility] long hospital    I called to update the patient on my conversation with Dr. Virgel Manifold.  Patient gave me permission for Dr. Ovid Curd to look at his films.  The films were looked at through video but patient PHI was not revealed.  We concluded that the only chest imaging was from 10 years ago chest x-ray looks clear.  In April 2022 he had CT scan of the abdomen.  The lung images on that done at the time of COVID did show bilateral bibasal lower lobe interstitial lung abnormalities [ILA].  We agreed that this did not fit a COVID-19 pattern.  Therefore I suspect patient had ILD/early ILD.  Subsequently got COVID which was mild but then also got like chemo.  We think patient definitely had like drug reaction from either [name] or [name] [based on history suspect [name] although [name] is not associated with eosinophilia].  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  At least that is a concern.  We thought that patient should undergo repeat bronchoscopy with lavage to rule out opportunistic infections and if so consider 3-day pulse steroid of 1 g Solu-Medrol each day.  We also thought that patient should continue to hold off any antimyeloma chemotherapy for this time being  Clearly explained to the patient that he has compromised lungs and immunologic  malignancy making it hard to treat the malignancy  He says he is feeling better.  Although he did admit that when he exerts his pulse ox still goes down to 85% / 86%.  Discussed the above plan with him and his wife.  He wants have a 6-minute walk test in a few days and then based on that and over the course of the weekend decide if he will undergo bronchoscopy lavage followed by admission for 3 days of IV steroids.  Can also look at potentially giving IV steroids as outpatient    (Telephone visit - Level 02 visit: Estb 11-20 for this visit type which was visit type: telephone visit in total care time and counseling or/and coordination of care by this undersigned MD - Dr Brand Males. This includes one or more of the following for care delivered on 08/10/2021 same day: pre-charting, chart review, note writing, documentation discussion of test results, diagnostic or treatment recommendations, prognosis, risks and benefits of management options, instructions, education, compliance or risk-factor reduction. It excludes time spent by the [place] or office staff in the care of the patient. Actual time was 15 min. E&M code is [phone])     SIGNATURE    Dr. Brand Males, M.D., F.C.C.P,  Pulmonary and Critical Care Medicine Staff Physician, [place] Director - Interstitial Lung Disease  Program  Pulmonary [place] at [place], [place], [address]  NPI Number:  NPI #[id number]  Pager: [phone], If no answer  -> Check AMION or Try  [phone] Telephone (clinical office): [phone] Telephone (research): [phone]  4:35 PM 08/10/2021

## 2021-08-13 ENCOUNTER — Ambulatory Visit (INDEPENDENT_AMBULATORY_CARE_PROVIDER_SITE_OTHER): Payer: Managed Care, Other (non HMO)

## 2021-08-13 ENCOUNTER — Telehealth: Payer: Self-pay | Admitting: Internal Medicine

## 2021-08-13 ENCOUNTER — Other Ambulatory Visit: Payer: Self-pay

## 2021-08-13 DIAGNOSIS — J849 Interstitial pulmonary disease, unspecified: Secondary | ICD-10-CM

## 2021-08-13 NOTE — Telephone Encounter (Signed)
   Start 2L portable o2  Yes I like to do bronch next week when I am at [facility] - he wated to think about it. IF he wants to talk more about it. I Can schedule another phone visit with him this week. Bronch wih TBBx for rna genomic classifer

## 2021-08-13 NOTE — Telephone Encounter (Signed)
Patient came in today for a qualifying walk. He qualified for O2 at 2L. Below are the results from his walk.   Ambulatory Pulse Oximetry  Row Name 08/13/21 1017      Resting  Supplemental oxygen during test? Yes      O2 Flow Rate (L/min) 2 L/min      Type Continuous      Resting Heart Rate 65      Resting Sp02 97  on room air      Lap 1 (250 feet)  HR 81      02 Sat 90  on room air      Lap 2 (250 feet)  HR 80      02 Sat 86  on room air      Lap 3 (250 feet)  HR 66      02 Sat 95  on 2L cont      Tech Comments: Patient was able to complete 2 laps on room air at a steady pace. He was visibly winded after the 1st lap but his O2 remained at 90%. He was able to 3/4th of the 2nd lap before his O2 dropped to 86%, HR: 80. He was placed on 2L of continuous O2, O2 recovered to 95%. He was able to maintain on 2L of continous O2 and ended at 97%, HR: 66. He was then placed on 2L of pulsed O2. O2 did drop to 95%, HR: 66. He was able to maintain at 2L of pulsed O2, ending at 96%, HR: 69. He denied any chest or leg pain during or after walk.       He is ok with proceeding with the O2 order. MR, are you ok with [place] placing the order for O2?   Also, he stated that he was waiting on instructions for his bronchoscopy. He stated this was discussed during his televisit on 10/21. Can you please advise? Thanks!

## 2021-08-13 NOTE — Telephone Encounter (Signed)
Spoke with pt who states understanding of O2 order and pt is now scheduled for televisit on 08/16/21 at 1330. Order placed for O2. Nothing further needed at this time.

## 2021-08-16 ENCOUNTER — Telehealth: Payer: Self-pay | Admitting: Internal Medicine

## 2021-08-16 ENCOUNTER — Telehealth: Payer: Self-pay | Admitting: *Deleted

## 2021-08-16 ENCOUNTER — Ambulatory Visit (INDEPENDENT_AMBULATORY_CARE_PROVIDER_SITE_OTHER): Payer: Managed Care, Other (non HMO) | Admitting: Internal Medicine

## 2021-08-16 ENCOUNTER — Other Ambulatory Visit: Payer: Self-pay

## 2021-08-16 DIAGNOSIS — J849 Interstitial pulmonary disease, unspecified: Secondary | ICD-10-CM | POA: Diagnosis not present

## 2021-08-16 DIAGNOSIS — J189 Pneumonia, unspecified organism: Secondary | ICD-10-CM | POA: Diagnosis not present

## 2021-08-16 DIAGNOSIS — T50905A Adverse effect of unspecified drugs, medicaments and biological substances, initial encounter: Secondary | ICD-10-CM

## 2021-08-16 NOTE — Telephone Encounter (Signed)
[name] patient regarding his appt with Dr. [name] tomorrow-08/17/21 Spoke with him and advised that Dr. [name] does not need to see him tomorrow as we are waiting on Pulmonary clearance. We cannot restart his myeloma meds yet.  Advised that his disease remains undetectable at this point.  Pt agrees that he does not need to come in. He has had appts with Dr. [name] this week, he is now on 02 @2l /min  now. He states this is helping his breathing .  He has been on this for 2 days now.  He is aware of his appt on 08/31/21. Advised that he can call at any time with questions or concerns.

## 2021-08-16 NOTE — Progress Notes (Signed)
OV 08/16/2021  Subjective:  Patient ID: Samuel [name], male , DOB: [birth year] , age 65 y.o. , MRN: [id number] , ADDRESS: [place] [address] PCP Samuel Contras, MD Patient Care Team: Samuel Contras, MD as PCP - General (Family Medicine)  This Provider for this visit: Treatment Team:  Attending Provider: Brand Males, MD  Type of visit: Telephone/Video Circumstance: COVID-19 national emergency Identification of patient Samuel [name] with 02-12-1956 and MRN [id number] - 2 person identifier Risks: Risks, benefits, limitations of telephone visit explained. Patient understood and verbalized agreement to proceed Anyone else on call: [phone] and Robin  Patient location: home This provider location: [address], [place] , [place]   08/16/2021 -  fu drug induced pneumonitis   HPI Samuel [name] 65 y.o. -53mwd desaturated. STarted on portable o2 since yesterday and Is feeling better. On Room air say he is desaturated.  Called to discuss Bronch . He is scheduled to see DR PAtwardhan 08/22/21 wed at 8.30am. PRefers not to have bronch done at that that tme.  Discussed the consensus about having bronchoscopy with lavage to rule out any opportunistic infections.  At this time I took the opportunity of also recommending transbronchial biopsies.  He did have transbronchial biopsies when he was a lot more hypoxemic in August 2022 we will send for histopathology and was nondiagnostic.  This time I recommended we send it off for RNA genomic classifier for UIP it is not a sensitive test but it is specific.  If it comes back positive then we would know that there is permanency to this and also its a marker of progression potentially.  He is willing to go through this.  Explained we under general anesthesia. Based on schedule will be myself or Dr. Valeta Harms doing it.  Explained the following risks   Risks of pneumothorax, hemothorax, sedation/anesthesia complications  such as cardiac or respiratory arrest or hypotension, stroke and bleeding all explained. Benefits of diagnosis but limitations of non-diagnosis also explained. Patient verbalized understanding and wished to proceed.    We then discussed pulse dose steroids.  Told him we will have to wait close to a week to make sure there is fungal smears PCP and AFB smears and bacterial culture negative.  At that point we will have to take a decision on giving 1 g Solu-Medrol for the a day for 3 days.  Ideally would need admission he does not want this.  He wants to do with is on an outpatient setting ideally.  Explained to him about the risks with steroids such as hyperglycemia, hypertension but said that I would try to work with the DME company or outpatient/home nursing to see if this would be possible.  He was appreciative.  Also discussed with Dr. Valeta Harms who is willing to do the biopsy based on schedule of the operating room, myself and the patient if things do not work out.  Sent a secure chat to Dr. Virgina Jock.  Told him about the patient.  He will give [place] a clearance on 08/22/2021.  Recommended also look for BNP and heart failure.  CT Chest data  No results found.    PFT  PFT Results Latest Ref Rng & Units 06/15/2021  FVC-Pre L 1.55  FVC-Predicted Pre % 33  FVC-Post L 1.45  FVC-Predicted Post % 31  Pre FEV1/FVC % % 83  Post FEV1/FCV % % 83  FEV1-Pre L 1.28  FEV1-Predicted Pre % 37  FEV1-Post L 1.20  DLCO uncorrected ml/min/mmHg 12.00  DLCO UNC% % 44  DLCO corrected ml/min/mmHg 12.52  DLCO COR %Predicted % 46  DLVA Predicted % 118  TLC L 4.18  TLC % Predicted % 59  RV % Predicted % 95       has a past medical history of Asthma, High cholesterol, Perennial allergic rhinitis, Sciatic pain, right, Seasonal allergic rhinitis, and Thyroid disease.   reports that he quit smoking about 38 years ago. His smoking use included cigarettes. He has a 0.30 pack-year smoking history. He has never used  smokeless tobacco.  Past Surgical History:  Procedure Laterality Date   BRONCHIAL BIOPSY  06/18/2021   Procedure: BRONCHIAL BIOPSIES;  Surgeon: Collene Gobble, MD;  Location: [facility];  Service: Cardiopulmonary;;   BRONCHIAL WASHINGS  06/18/2021   Procedure: BRONCHIAL WASHINGS;  Surgeon: Collene Gobble, MD;  Location: [facility];  Service: Cardiopulmonary;;   NASAL SINUS SURGERY     NECK SURGERY  1996   VIDEO BRONCHOSCOPY N/A 06/18/2021   Procedure: VIDEO BRONCHOSCOPY WITH FLUORO;  Surgeon: Collene Gobble, MD;  Location: [facility];  Service: Cardiopulmonary;  Laterality: N/A;    Allergies  Allergen Reactions   Clarithromycin    Penicillins     Immunization History  Administered Date(s) Administered   Fluad Quad(high Dose 65+) 08/09/2021   Influenza Split 10/27/2017   Influenza, High Dose Seasonal PF 12/01/2017, 11/29/2019, 12/19/2020   Influenza, Quadrivalent, Recombinant, Inj, Pf 07/28/2019, 08/11/2020   Influenza-Unspecified 11/26/2011, 07/21/2017   PFIZER(Purple Top)SARS-COV-2 Vaccination 12/27/2019, 01/24/2020, 09/21/2020   Tdap 08/20/2005, 09/07/2015   Zoster, Live 11/06/2017, 05/07/2018    Family History  Problem Relation Age of Onset   Allergies Father    Allergies Mother    Hypertension Other    Heart disease Other    Breast cancer Paternal Grandmother      Current Outpatient Medications:    acyclovir (ZOVIRAX) 400 MG tablet, Take 1 tablet (400 mg total) by mouth 2 (two) times daily., Disp: 60 tablet, Rfl: 3   albuterol (VENTOLIN HFA) 108 (90 Base) MCG/ACT inhaler, Inhale 2 puffs into the lungs every 6 hours as needed for wheezing or shortness of breath., Disp: 8.5 g, Rfl: 1   allopurinol (ZYLOPRIM) 300 MG tablet, Take 300 mg by mouth daily., Disp: , Rfl:    ALPRAZolam (XANAX) 1 MG tablet, Take 1 mg by mouth 2 (two) times daily as needed for anxiety., Disp: , Rfl:    aspirin EC 81 MG tablet, Take 81 mg by mouth daily. Swallow whole., Disp: , Rfl:     ASSESS FULL RANGE PEAK METER DEVI, as directed., Disp: , Rfl:    cholecalciferol (VITAMIN D) 1000 UNITS tablet, Take 3,000 Units by mouth daily., Disp: , Rfl:    EPINEPHrine (EPI-PEN) 0.3 mg/0.3 mL DEVI, Inject 0.3 mg into the muscle once as needed., Disp: , Rfl:    ezetimibe-simvastatin (VYTORIN) 10-40 MG per tablet, Take 1 tablet by mouth at bedtime., Disp: , Rfl:    fluticasone-salmeterol (ADVAIR HFA) 230-21 MCG/ACT inhaler, Inhale 2 puffs into the lungs 2 (two) times daily., Disp: 1 each, Rfl: 12   furosemide (LASIX) 20 MG tablet, Take 1 tablet (20 mg total) by mouth as needed for fluid or edema., Disp: 14 tablet, Rfl: 1   lenalidomide ([name]) 10 MG capsule, Take 1 capsule (10 mg total) by mouth daily. [employer] Auth # [id number]   Date Obtained 07/19/21  Take 1 capsule daily x 14 days, none for 7  days, Disp: 14 capsule, Rfl: 0   levothyroxine (SYNTHROID) 150 MCG tablet, Take 150 mcg by mouth every other day. Alternates with 175 mcg, Disp: , Rfl:    levothyroxine (SYNTHROID) 175 MCG tablet, Take 1 tablet by mouth every other day., Disp: , Rfl:    montelukast (SINGULAIR) 10 MG tablet, Take 10 mg by mouth at bedtime., Disp: , Rfl:    Multiple Vitamin (MULTIVITAMIN) tablet, Take 1 tablet by mouth daily., Disp: , Rfl:    omeprazole (PRILOSEC) 40 MG capsule, Take 1 capsule by mouth daily., Disp: , Rfl:    ondansetron (ZOFRAN) 8 MG tablet, Take 1 tablet (8 mg total) by mouth every 8 (eight) hours as needed for nausea or vomiting., Disp: 30 tablet, Rfl: 0   predniSONE (DELTASONE) 10 MG tablet, Take 5 tablets (50 mg total) by mouth daily with breakfast for 14 days, THEN 4 tablets (40 mg total) daily with breakfast for 14 days, THEN 3 tablets (30 mg total) daily with breakfast for 14 days, THEN 2 tablets (20 mg total) daily with breakfast for 14 days. Once you reach 20mg , remain on 20mg  until further notice.., Disp: 196 tablet, Rfl: 0   prochlorperazine (COMPAZINE) 10 MG tablet, Take 1 tablet (10 mg total) by  mouth every 6 (six) hours as needed for nausea or vomiting., Disp: 30 tablet, Rfl: 0   sildenafil (REVATIO) 20 MG tablet, 2-5 tablets one hour prior to sexual intimacy, Disp: , Rfl:    tamsulosin (FLOMAX) 0.4 MG CAPS capsule, Take 0.4 mg by mouth daily., Disp: , Rfl:    triamcinolone cream (KENALOG) 0.1 %, Apply topically., Disp: , Rfl:       Objective:   There were no vitals filed for this visit.  Estimated body mass index is 30.53 kg/m as calculated from the following:   Height as of 08/09/21: 5\' 10"  (1.778 m).   Weight as of 08/09/21: 212 lb 12.8 oz (96.5 kg).  @WEIGHTCHANGE @  There were no vitals filed for this visit.   Physical Exam  Sounded normal on the phone.    Assessment:       ICD-10-CM   1. ILD (interstitial lung disease) ([place])  J84.9     2. 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 ILD (interstitial lung disease) ([place])  J84.9     2. 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 This includes one or more of the following for care delivered on 08/16/2021 same day: pre-charting, chart review, note writing, documentation discussion of test results, diagnostic or treatment recommendations, prognosis, risks and benefits of management options, instructions, education, compliance or risk-factor reduction. It excludes time spent by the [place] or office staff in the care of the patient. Actual time was 30 min. E&M code is [phone])   SIGNATURE    Dr. Brand Males, M.D., F.C.C.P,  Pulmonary and Critical Care Medicine Staff Physician, [place] Director - Interstitial Lung Disease  Program  Pulmonary  [place] at [place], [place], [address]  Pager: [phone]  [phone], If no answer or between  15:00h - 7:00h: call [phone] Telephone: [phone]  5:46 PM 08/16/2021

## 2021-08-16 NOTE — Patient Instructions (Addendum)
ICD-10-CM   1. ILD (interstitial lung disease) ([place])  J84.9     2. 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

## 2021-08-16 NOTE — Progress Notes (Signed)
Results were provided to patient on 08/09/2021 office visit with Dr. [name].  Nothing further is needed at this time.  Wyn Quaker FNP

## 2021-08-17 ENCOUNTER — Telehealth: Payer: Self-pay | Admitting: Internal Medicine

## 2021-08-17 ENCOUNTER — Ambulatory Visit: Payer: Managed Care, Other (non HMO)

## 2021-08-17 ENCOUNTER — Ambulatory Visit: Payer: Managed Care, Other (non HMO) | Admitting: Hematology and Oncology

## 2021-08-17 ENCOUNTER — Other Ambulatory Visit: Payer: Managed Care, Other (non HMO)

## 2021-08-17 NOTE — Telephone Encounter (Signed)
PATIENT: Samuel [name] GENDER: male MRN: [id number] DOB: 1956-09-22 ADDRESS: [place] [address]    Please schedule the following:  Diagnosis: Drug inducted pneumonitis, ILD Procedure: Video bronchocoopy, flexible bronchoscopy with BAL + Transbronchial biopsy Envisia Classifer Transbronchial biopsy:  YES/ Anesthesia: General Do you need Fluro? yes Size of Scope: regular Pre-med nebulized lidocaine: no Priority: 08/23/21 thu with MR at [facility] in the morning - any day after that has to be with I[facility] but before 08/30/21 ideally Date: see above Alternate Date: for MR in the morning 08/23/21 anytime. Needs to end by 1pm or 2pm Time: AMabopv/ PMabove Location: for MR is [place]. Otherwise, check with I[facility] Does patient have OSA? no DM? no Or Latex allergy? n Medication Restriction: no anticoagulatnts Anticoagulate/Antiplatelet: stop 3d before procedre Pre-op Labs Ordered:xxx Imaging request: cxr post procedure       MISCELLANEOUS KEY INSTRUCTIONS    Please coordinate Pre-op COVID Testing   Please let Dr [name] know via reply phone message on Epic  Thank you     Key patient medical info     Allergy History:  Allergies  Allergen Reactions   Clarithromycin    Penicillins      Current Outpatient Medications:    acyclovir (ZOVIRAX) 400 MG tablet, Take 1 tablet (400 mg total) by mouth 2 (two) times daily., Disp: 60 tablet, Rfl: 3   albuterol (VENTOLIN HFA) 108 (90 Base) MCG/ACT inhaler, Inhale 2 puffs into the lungs every 6 hours as needed for wheezing or shortness of breath., Disp: 8.5 g, Rfl: 1   allopurinol (ZYLOPRIM) 300 MG tablet, Take 300 mg by mouth daily., Disp: , Rfl:    ALPRAZolam (XANAX) 1 MG tablet, Take 1 mg by mouth 2 (two) times daily as needed for anxiety., Disp: , Rfl:    aspirin EC 81 MG tablet, Take 81 mg by mouth daily. Swallow whole., Disp: , Rfl:    ASSESS FULL RANGE PEAK METER DEVI, as directed., Disp: , Rfl:     cholecalciferol (VITAMIN D) 1000 UNITS tablet, Take 3,000 Units by mouth daily., Disp: , Rfl:    EPINEPHrine (EPI-PEN) 0.3 mg/0.3 mL DEVI, Inject 0.3 mg into the muscle once as needed., Disp: , Rfl:    ezetimibe-simvastatin (VYTORIN) 10-40 MG per tablet, Take 1 tablet by mouth at bedtime., Disp: , Rfl:    fluticasone-salmeterol (ADVAIR HFA) 230-21 MCG/ACT inhaler, Inhale 2 puffs into the lungs 2 (two) times daily., Disp: 1 each, Rfl: 12   furosemide (LASIX) 20 MG tablet, Take 1 tablet (20 mg total) by mouth as needed for fluid or edema., Disp: 14 tablet, Rfl: 1   lenalidomide ([name]) 10 MG capsule, Take 1 capsule (10 mg total) by mouth daily. [employer] Auth # [id number]   Date Obtained 07/19/21  Take 1 capsule daily x 14 days, none for 7 days, Disp: 14 capsule, Rfl: 0   levothyroxine (SYNTHROID) 150 MCG tablet, Take 150 mcg by mouth every other day. Alternates with 175 mcg, Disp: , Rfl:    levothyroxine (SYNTHROID) 175 MCG tablet, Take 1 tablet by mouth every other day., Disp: , Rfl:    montelukast (SINGULAIR) 10 MG tablet, Take 10 mg by mouth at bedtime., Disp: , Rfl:    Multiple Vitamin (MULTIVITAMIN) tablet, Take 1 tablet by mouth daily., Disp: , Rfl:    omeprazole (PRILOSEC) 40 MG capsule, Take 1 capsule by mouth daily., Disp: , Rfl:    ondansetron (ZOFRAN) 8 MG tablet, Take 1 tablet (8 mg  total) by mouth every 8 (eight) hours as needed for nausea or vomiting., Disp: 30 tablet, Rfl: 0   predniSONE (DELTASONE) 10 MG tablet, Take 5 tablets (50 mg total) by mouth daily with breakfast for 14 days, THEN 4 tablets (40 mg total) daily with breakfast for 14 days, THEN 3 tablets (30 mg total) daily with breakfast for 14 days, THEN 2 tablets (20 mg total) daily with breakfast for 14 days. Once you reach 67m, remain on 230muntil further notice.., Disp: 196 tablet, Rfl: 0   prochlorperazine (COMPAZINE) 10 MG tablet, Take 1 tablet (10 mg total) by mouth every 6 (six) hours as needed for nausea or vomiting., Disp:  30 tablet, Rfl: 0   sildenafil (REVATIO) 20 MG tablet, 2-5 tablets one hour prior to sexual intimacy, Disp: , Rfl:    tamsulosin (FLOMAX) 0.4 MG CAPS capsule, Take 0.4 mg by mouth daily., Disp: , Rfl:    triamcinolone cream (KENALOG) 0.1 %, Apply topically., Disp: , Rfl:    has a past medical history of Asthma, High cholesterol, Perennial allergic rhinitis, Sciatic pain, right, Seasonal allergic rhinitis, and Thyroid disease.    has a past surgical history that includes Neck surgery (1996); Nasal sinus surgery; Video bronchoscopy (N/A, 06/18/2021); Bronchial washings (06/18/2021); and Bronchial biopsy (06/18/2021).   SIGNATURE    Dr. MuBrand MalesM.D., F.C.C.P,  Pulmonary and Critical Care Medicine Staff Physician, [place]irector - Interstitial Lung Disease  Program  [place]ulmonary [id number]: [phone] no answer or between  15:00h - 7:00h: call [phone] Telephone: [phone]  12:35 PM 08/17/2021

## 2021-08-20 ENCOUNTER — Telehealth: Payer: Self-pay | Admitting: Internal Medicine

## 2021-08-20 NOTE — Telephone Encounter (Signed)
Spoke to [place] with infusion clinic. Infusion clinic does not do outpatient infusions.  Lm for Melissa with [facility] for assistance.

## 2021-08-20 NOTE — Telephone Encounter (Signed)
I called the patient and his wife wants to know when they need to schedule the Bronch. Please advise on when you want to completed this procedure?

## 2021-08-20 NOTE — Telephone Encounter (Signed)
[name]/Triage  Could you please find out if the infusion center or a DME company - can do IV steroids at home or he can come. Dose is 500mg  IV Q12h or 250mg  Q6h x 3 days. Alternative, he will have to get admitted   PS: d/w DR Donneta Romberg - his allergist - he called to get an update. And I gave him update   SIGNATURE    Dr. Brand Males, M.D., F.C.C.P,  Pulmonary and Critical Care Medicine Staff Physician, [place] Director - Interstitial Lung Disease  Program  Pulmonary [place] [place] at [place], [place], [address]  NPI Number:  NPI #[id number]  Pager: [phone], If no answer  -> Check AMION or Try [phone] Telephone (clinical office): [phone] Telephone (research): [phone]  1:47 PM 08/20/2021

## 2021-08-20 NOTE — Telephone Encounter (Signed)
I received a call from [place] at [facility] that the patient and we can use the patient infusion center that is with [employer] and they can do the orders for [place].     Can we do 1 Gram Q24 hr per the infusion people with [employer] ? This may be easier to make sure a nurse is able to be out there with the patient to give his medication? Please advise.

## 2021-08-21 ENCOUNTER — Encounter: Payer: Self-pay | Admitting: Hematology and Oncology

## 2021-08-21 ENCOUNTER — Other Ambulatory Visit: Payer: Self-pay | Admitting: *Deleted

## 2021-08-21 ENCOUNTER — Other Ambulatory Visit: Payer: Self-pay | Admitting: Hematology and Oncology

## 2021-08-21 DIAGNOSIS — C9000 Multiple myeloma not having achieved remission: Secondary | ICD-10-CM

## 2021-08-21 DIAGNOSIS — Z01810 Encounter for preprocedural cardiovascular examination: Secondary | ICD-10-CM | POA: Insufficient documentation

## 2021-08-21 MED ORDER — LENALIDOMIDE 10 MG PO CAPS: ORAL_CAPSULE | ORAL | 0 refills | Status: DC

## 2021-08-21 NOTE — Telephone Encounter (Signed)
Called and spoke with patient. Let them know their Bronch is scheduled for 08/28/21 with Dr. Valeta Harms at [facility].  Patient was instructed to arrive at hospital at 2:30. They were instructed to bring someone with them as they will not be able to drive home from procedure. Patient instructed not to have anything to eat or drink after midnight.   Patient's covid screening is scheduled for 08/24/2021   Patient voiced understanding, nothing further needed  Routing to Dr. Valeta Harms and Dr. [name]  as Juluis Rainier

## 2021-08-21 NOTE — Telephone Encounter (Signed)
Is there any day(s) that any of you are able to do the bronch on this patient? 08/23/21 is not available.

## 2021-08-21 NOTE — Telephone Encounter (Signed)
Called and spoke with ENDO at [facility] and they were able to schedule patient for Bronch on 08/28/21 at 11:45.  They had no outpatient slots available for for 08/23/21   Dr. Valeta Harms are you able to do this?

## 2021-08-21 NOTE — Telephone Encounter (Signed)
Please see other encounter with updated info. Will close this encounter.

## 2021-08-21 NOTE — Telephone Encounter (Signed)
Spoke with [facility], she is aware of needed bronch. Will forward this to procedure pool.

## 2021-08-21 NOTE — Telephone Encounter (Signed)
Pls see my phonte to procedure pool sent on 08/17/21. Either me (a specificc day this week) Or [facility] can do it. Patient is seeing cardiology tomorrow and have to wait for that before doing it

## 2021-08-21 NOTE — Telephone Encounter (Signed)
Ok thanks for the information. Patient have bronch 08/28/21 tuesda. Will decide on admission v home steroids on 08/29/21 or 08/30/21. Please ask [facility] - if they can check sugars, vitals when they go in. ? And If they go on weekends

## 2021-08-21 NOTE — Progress Notes (Signed)
Patient referred by Antony Contras, MD for cardiac risk stratification  Subjective:   Samuel [name], male    DOB: 1956-09-25, 65 y.o.   MRN: [id number]   Chief Complaint  Patient presents with   New Patient (Initial Visit)   Shortness of Breath     HPI  65 y/o Caucasian male with multiple myeloma, interstitial lung disease, drug-induced pneumonitis, echocardiogram stratification prior to bronchoscopy under general anesthesia.  Patient is going to undergo lung biopsy under general anesthesia. he was referred for cardiac risk stratification prior to the same.  Patient is here with his wife today.  He is a Music therapist, continues to work regularly.  He has been fairly active up until May 2022.  He was treated with [name] for multiple myeloma, with which his multiple myeloma is reportedly remission.  However, he developed relatively rapid onset of severe central dyspnea.  He has been seeing Dr. [name] since then with working diagnosis of interstitial lung disease versus drug-induced pneumonitis.  His CT scan has consistently showed bilateral groundglass opacities with peribronchovascular reticular opacities, traction bronchiectasis.  Differential reported on CT chest variance sequela of her acute lung injury, NSIP, or fibrotic HP.   There has been incidental findings of aortic calcification in LAD and RCA calcification.  Patient denies any chest pain symptoms.  Prior to May 2022, he did not have any significant exertional dyspnea.  BMP and NT proBNP has been normal the last few months.  Troponin was also normal.  He smoked in college, none since then.  His father did have history of coronary artery disease requiring CABG.  Patient is currently on aspirin and statin multiple control lipids.    Past Medical History:  Diagnosis Date   Asthma    High cholesterol    under control.    Perennial allergic rhinitis    Sciatic pain, right    Seasonal allergic rhinitis    Thyroid  disease      Past Surgical History:  Procedure Laterality Date   BRONCHIAL BIOPSY  06/18/2021   Procedure: BRONCHIAL BIOPSIES;  Surgeon: Collene Gobble, MD;  Location: [facility];  Service: Cardiopulmonary;;   BRONCHIAL WASHINGS  06/18/2021   Procedure: BRONCHIAL WASHINGS;  Surgeon: Collene Gobble, MD;  Location: [facility];  Service: Cardiopulmonary;;   NASAL SINUS SURGERY     NECK SURGERY  1996   VIDEO BRONCHOSCOPY N/A 06/18/2021   Procedure: VIDEO BRONCHOSCOPY WITH FLUORO;  Surgeon: Collene Gobble, MD;  Location: [facility];  Service: Cardiopulmonary;  Laterality: N/A;     Social History   Tobacco Use  Smoking Status Former   Packs/day: 0.10   Years: 3.00   Pack years: 0.30   Types: Cigarettes   Quit date: 10/21/1982   Years since quitting: 38.8  Smokeless Tobacco Never    Social History   Substance and Sexual Activity  Alcohol Use Yes   Comment: 1 drink daily     Family History  Problem Relation Age of Onset   Allergies Father    Allergies Mother    Hypertension Other    Heart disease Other    Breast cancer Paternal Grandmother      Current Outpatient Medications on File Prior to Visit  Medication Sig Dispense Refill   acyclovir (ZOVIRAX) 400 MG tablet Take 1 tablet (400 mg total) by mouth 2 (two) times daily. 60 tablet 3   albuterol (VENTOLIN HFA) 108 (90 Base) MCG/ACT inhaler Inhale 2 puffs into the lungs every  6 hours as needed for wheezing or shortness of breath. 8.5 g 1   allopurinol (ZYLOPRIM) 300 MG tablet Take 300 mg by mouth daily.     ALPRAZolam (XANAX) 1 MG tablet Take 1 mg by mouth 2 (two) times daily as needed for anxiety.     aspirin EC 81 MG tablet Take 81 mg by mouth daily. Swallow whole.     ASSESS FULL RANGE PEAK METER DEVI as directed.     cholecalciferol (VITAMIN D) 1000 UNITS tablet Take 3,000 Units by mouth daily.     EPINEPHrine (EPI-PEN) 0.3 mg/0.3 mL DEVI Inject 0.3 mg into the muscle once as needed.     ezetimibe-simvastatin  (VYTORIN) 10-40 MG per tablet Take 1 tablet by mouth at bedtime.     fluticasone-salmeterol (ADVAIR HFA) 230-21 MCG/ACT inhaler Inhale 2 puffs into the lungs 2 (two) times daily. 1 each 12   furosemide (LASIX) 20 MG tablet Take 1 tablet (20 mg total) by mouth as needed for fluid or edema. 14 tablet 1   levothyroxine (SYNTHROID) 150 MCG tablet Take 150 mcg by mouth every other day. Alternates with 175 mcg     levothyroxine (SYNTHROID) 175 MCG tablet Take 1 tablet by mouth every other day.     montelukast (SINGULAIR) 10 MG tablet Take 10 mg by mouth at bedtime.     Multiple Vitamin (MULTIVITAMIN) tablet Take 1 tablet by mouth daily.     omeprazole (PRILOSEC) 40 MG capsule Take 1 capsule by mouth daily.     ondansetron (ZOFRAN) 8 MG tablet Take 1 tablet (8 mg total) by mouth every 8 (eight) hours as needed for nausea or vomiting. 30 tablet 0   predniSONE (DELTASONE) 10 MG tablet Take 5 tablets (50 mg total) by mouth daily with breakfast for 14 days, THEN 4 tablets (40 mg total) daily with breakfast for 14 days, THEN 3 tablets (30 mg total) daily with breakfast for 14 days, THEN 2 tablets (20 mg total) daily with breakfast for 14 days. Once you reach 81m, remain on [id number] further notice.. 196 tablet 0   prochlorperazine (COMPAZINE) 10 MG tablet Take 1 tablet (10 mg total) by mouth every 6 (six) hours as needed for nausea or vomiting. 30 tablet 0   [name] 10 MG capsule TAKE 1 CAPSULE DAILY FOR 14 DAYS, NONE FOR 7 DAYS 14 capsule 0   sildenafil (REVATIO) 20 MG tablet 2-5 tablets one hour prior to sexual intimacy     tamsulosin (FLOMAX) 0.4 MG CAPS capsule Take 0.4 mg by mouth daily.     triamcinolone cream (KENALOG) 0.1 % Apply topically.     No current facility-administered medications on file prior to visit.    Cardiovascular and other pertinent studies:  EKG 08/22/2021: Sinus rhythm 66 bpm Normal EKG  Echocardiogram 06/16/2021:  1. Left ventricular ejection fraction, by estimation, is 65  to 70%. The  left ventricle has normal function. The left ventricle has no regional  wall motion abnormalities. There is mild left ventricular hypertrophy.  Left ventricular diastolic parameters are indeterminate.  Elevated left atrial pressure.   2. Right ventricular systolic function is normal. The right ventricular  size is normal. Tricuspid regurgitation signal is inadequate for assessing  PA pressure.   3. Trivial pericardial effusion is circumferential.   4. The mitral valve is normal in structure. No evidence of mitral valve  regurgitation. No evidence of mitral stenosis.   5. The aortic valve has an indeterminant number of cusps. Aortic valve  regurgitation is not visualized. No aortic stenosis is present.   6. Aortic dilatation noted. There is mild dilatation of the ascending  aorta, measuring 38 mm.     Recent labs: 08/03/2021: Glucose 123, BUN/Cr 25/0.89. EGFR >60. Na/K 128/3.7. Rest of the CMP normal H/H 14/39. MCV 93. Platelets 191 HbA1C 5.7% Lipid panel N/A TSH N/A NT pro BNP 47 normal LDH 335 high Trop HS 3,5 normal    Review of Systems  Cardiovascular:  Positive for dyspnea on exertion. Negative for chest pain, leg swelling, palpitations and syncope.        Vitals:   08/22/21 0833 08/22/21 0848  BP: (!) 155/95 (!) 156/99  Pulse: 69 69  Resp: 17   Temp: 97.8 F (36.6 C)   SpO2: 95% 95%     Body mass index is 30.33 kg/m. Filed Weights   08/22/21 0833  Weight: 211 lb 6.4 oz (95.9 kg)     Objective:   Physical Exam Vitals and nursing note reviewed.  Constitutional:      General: He is not in acute distress. Neck:     Vascular: No JVD.  Cardiovascular:     Rate and Rhythm: Normal rate and regular rhythm.     Heart sounds: Normal heart sounds. No murmur heard. Pulmonary:     Effort: Pulmonary effort is normal.     Breath sounds: Wheezing present. No rales.  Musculoskeletal:     Right lower leg: No edema.     Left lower leg: No edema.         Assessment & Recommendations:    65 y/o Caucasian male with multiple myeloma, interstitial lung disease, drug-induced pneumonitis, echocardiogram stratification prior to bronchoscopy under general anesthesia.  Cardiac risk stratification: Presence of calcification in LAD, RCA, as well as aorta.  However, he does not have consistent angina symptoms.  Physical exam, echocardiogram, and recent lab work not consistent with clinical heart failure.  I think his coronary and aorta calcification is incidental finding rather than because of his symptoms, and less likely to be obstructive in nature.   Overall, his perioperative risk is elevated, both from cardiac and pulmonary standpoint.  However, I continued his prohibitive to proceeding bronchoscopy.  I do not think the benefit of any ischemic work-up outweighs the benefits of bronchoscopy, as clinical suspicion for pulmonary pathology is higher.  Given his coronary annular calcification, I recommend continuing aspirin and statin.  Lipids are well controlled.  Blood pressure elevated today, but usually normal.  I will not start him on any antihypertensive therapy today.  I agree with proceeding with bronchoscopy as per Dr. [name] recommendations.  I will see him on as-needed basis in future.  Thank you for referring the patient to [place]. Please feel free to contact with any questions.   Nigel Mormon, MD Pager: [phone] Office: [phone]

## 2021-08-22 ENCOUNTER — Encounter: Payer: Self-pay | Admitting: Cardiology

## 2021-08-22 ENCOUNTER — Other Ambulatory Visit: Payer: Self-pay

## 2021-08-22 ENCOUNTER — Ambulatory Visit: Payer: Managed Care, Other (non HMO) | Admitting: Cardiology

## 2021-08-22 VITALS — BP 156/99 | HR 69 | Temp 97.8°F | Resp 17 | Ht 70.0 in | Wt 211.4 lb

## 2021-08-22 DIAGNOSIS — Z01810 Encounter for preprocedural cardiovascular examination: Secondary | ICD-10-CM

## 2021-08-22 DIAGNOSIS — J849 Interstitial pulmonary disease, unspecified: Secondary | ICD-10-CM

## 2021-08-22 NOTE — Telephone Encounter (Signed)
Routing this encounter to Richardson Landry for him to review and see if we might be able to get pt started doing this int he infusion center. Please advise.

## 2021-08-22 NOTE — Telephone Encounter (Signed)
Called and spoke with Melissa. She stated that once [facility] was bought by [facility] was split into 3 companies. [facility] is the company who handles the infusion. I called and spoke with Carolynn Sayers. She stated that it would be nearly impossible to get the insurance to approve just a few days of infusion services. They usually like to approve services for 30-60 days. Since he has Pharmacist, community, this makes even harder. She stated that the best bet would be to see if the patient could have the infusion done here at the infusion center.   As far for vitals, they do not check vitals at visits nor visit on the weekends. The nurse will setup the infusion, make sure the patient is ok and then teach the family or patient how to D/C the IV when the infusion is over. Their goal is make the patient self sufficient with the infusions.

## 2021-08-22 NOTE — Telephone Encounter (Signed)
Ok thanks. Seems home infusion center servive will nto work. Please check downstairs with [facility] infusion services Jena Gauss if patient can come tehre to them twice daily (need vitals check before and after) floor IV solumedrol 500mg  twice daily as outpatient. Based on the response, might just decide to electively admit patient for 3 days of steroids  PS: I Tjhink prior when question was asked downstairs they might have thought if the question was if they go to patient home but the question is if patient can come twice daily x 3 days for IV solumedrol

## 2021-08-24 ENCOUNTER — Other Ambulatory Visit: Payer: Managed Care, Other (non HMO)

## 2021-08-24 ENCOUNTER — Ambulatory Visit: Payer: Managed Care, Other (non HMO)

## 2021-08-24 ENCOUNTER — Other Ambulatory Visit: Payer: Self-pay | Admitting: Internal Medicine

## 2021-08-24 LAB — SARS CORONAVIRUS 2 (TAT 6-24 HRS): SARS Coronavirus 2: NEGATIVE

## 2021-08-27 ENCOUNTER — Other Ambulatory Visit: Payer: Self-pay

## 2021-08-27 ENCOUNTER — Telehealth: Payer: Self-pay | Admitting: Pulmonary Disease

## 2021-08-27 ENCOUNTER — Encounter (HOSPITAL_COMMUNITY): Payer: Self-pay | Admitting: Pulmonary Disease

## 2021-08-27 NOTE — Telephone Encounter (Signed)
Mandi: Thanks  Brad: For BAL cell count. MTB NAA, all cultures, PCP stain/cutlure and also Envisia TTBX.    Will close message

## 2021-08-27 NOTE — Progress Notes (Signed)
Anesthesia Chart Review: SAME DAY WORK-UP  Case: [id number] Date/Time: 08/28/21 1600   Procedure: VIDEO BRONCHOSCOPY WITH FLUORO   Anesthesia type: Monitor Anesthesia Care   Pre-op diagnosis: ILD with BAL   Location: [facility] / [place] [facility]   Surgeons: Garner Nash, DO       DISCUSSION: Patient is a 65 year old male scheduled for the above procedure. S/p video bronchoscopy 06/18/21, cytology showed no malignancy cells. "Significant eosinophilia in the BAL 51% suggest drug-induced pneumonitis" which was felt related to [name] that he was taking for multiple myeloma. Currently chemotherapy on hold. 08/16/21 notes by Dr. [name] indicate that bronchoscopy with lavage recommended to rule out any opportunistic infections. He added, "At this time I took the opportunity of also recommending transbronchial biopsies.  He did have transbronchial biopsies when he was a lot more hypoxemic in August 2022 we will send for histopathology and was nondiagnostic.  This time I recommended we send it off for RNA genomic classifier for UIP it is not a sensitive test but it is specific.  If it comes back positive then we would know that there is permanency to this and also its a marker of progression potentially.".   Other history includes former smoker (quit 10/21/82), asthma, GERD, multiple myeloma (diagnosis confirmed 02/22/21), ILD, hypothyroidism, hypercholesterolemia, nasal sinus surgery, neck surgery (1996), COVID-19 (01/2021).   - [place] admission 06/15/21-06/18/21 for worsening DOE. Exertional O2 sat in the 80's. Referred from oncology office. Imaging showed chronic appearing diffuse parenchymal lung densities. Moderate severity coronary calcifications, no PE by CTA. Echo showed normal LVEF. BNP 32. Pulmonology consulted. ILD possibly related to prior chemotherapy (drug-induced pneumonitis) as he was on bortezomib and [name] (started 02/2021). He was started on prednisone taper, and [name]  stopped. Bronchoscopy done 06/18/21 as discussed above. Weaned to RA by discharge (but is currently back on home O2 at 2L/[place]).   Last oncology visit with Dr. [name] 08/03/21. Patient had reached very good partial response as of 07/18/21 visit, and chemotherapy stopped until pulmonary issues further worked up and improved. He notes ILD not improving and steroids and bronchoscopy/lung biopsy being considered.  Patient also had initial consult on 07/10/21 at  for BMT evaluation consideration.   He had preoperative cardiologist evaluation by Dr. Virgina Jock on 08/22/21 given CAD on recent imaging, dyspnea, and family history of CAD. He wrote,   "Cardiac risk stratification: Presence of calcification in LAD, RCA, as well as aorta.  However, he does not have consistent angina symptoms.  Physical exam, echocardiogram, and recent lab work not consistent with clinical heart failure.  I think his coronary and aorta calcification is incidental finding rather than because of his symptoms, and less likely to be obstructive in nature.    Overall, his perioperative risk is elevated, both from cardiac and pulmonary standpoint.  However, I continued his prohibitive to proceeding bronchoscopy.  I do not think the benefit of any ischemic work-up outweighs the benefits of bronchoscopy, as clinical suspicion for pulmonary pathology is higher... I agree with proceeding with bronchoscopy as per Dr. [name] recommendations." As needed follow-up recommended.   08/24/2021 presurgical COVID-19 test negative.  He is a same-day work-up, anesthesia team to evaluate on the day of surgery.   VS:  BP Readings from Last 3 Encounters:  08/22/21 (!) 156/99  08/09/21 122/80  08/03/21 (!) 157/83   Pulse Readings from Last 3 Encounters:  08/22/21 69  08/09/21 74  08/03/21 82  PROVIDERS: Antony Contras, MD is PCP  Orson Slick, MD is HEM-ONC Montel Clock, DO is [facility] Oncologist with the [facility]. Initial consult 07/10/21.  Brand Males, MD is pulmonologist Vernell Leep, MD is cardiologist. As needed follow-up recommended 08/22/21.    LABS: Most recent lab results include 08/03/21 and include: Lab Results  Component Value Date   WBC 13.1 (H) 08/03/2021   HGB 14.1 08/03/2021   HCT 39.9 08/03/2021   PLT 191 08/03/2021   GLUCOSE 123 (H) 08/03/2021   ALT 29 08/03/2021   AST 23 08/03/2021   NA 128 (L) 08/03/2021   K 3.7 08/03/2021   CL 99 08/03/2021   CREATININE 0.89 08/03/2021   BUN 25 (H) 08/03/2021   CO2 23 08/03/2021   INR 1.0 02/02/2021   HGBA1C 5.7 07/12/2021     OTHER: Overnight pulse oximetry 07/24/21-07/25/21: Qualified Group 1 - SpO2 88% or less for at least 5 cumulative mins. Duration < 88% was 46 min 35 sec.  Simple office walk 185 feet x  3 laps goal with forehead probe 07/12/2021    O2 used ra  Number laps completed 3  Comments about pace slow  Resting Pulse Ox/HR 100% and 69/min  Final Pulse Ox/HR 92% and 81/min  Desaturated </= 88% no  Desaturated <= 3% points Yes 8  Got Tachycardic >/= 90/min no  Symptoms at end of test No complaints  Miscellaneous comments x   PFTs 06/15/21: FVC 1.55 (33%), post 1.45 (31%). FEV1 1.28 (37%), post 1.20 (34%). DLCO unc 12.00 (44%), cor 12.52 (46%).   IMAGES: CT Chest (high resolution) 07/30/21: IMPRESSION: - Limited evaluation due to respiratory motion artifact. Within limitations, there are diffuse bilateral ground-glass opacities with peribronchovascular and subpleural reticular opacities, traction bronchiectasis and no clear craniocaudal predominance. Differential considerations include sequela of acute lung injury, NSIP, or fibrotic HP given presence of air trapping. Mild subpleural reticular opacities were present on visualized portions of the lung on prior abdomen and pelvis CT dated February 02, 2021, although majority of findings are new. Findings are suggestive of an alternative  diagnosis (not UIP) per consensus guidelines: Diagnosis of Idiopathic Pulmonary Fibrosis: An Official ATS/ERS/JRS/ALAT Clinical Practice Guideline. [place], Iss 5, [phone], Jun 21 2017. - Small solid pulmonary nodule the right middle lobe measuring 3 mm. No follow-up needed if patient is low-risk. Non-contrast chest CT can be considered in 12 months if patient is high-risk. This recommendation follows the consensus statement: Guidelines for Management of Incidental Pulmonary Nodules Detected on CT Images: From the Fleischner Society 2017; Radiology 2017; 284:228-243. - Aortic Atherosclerosis (ICD10-I70.0).   EKG: EKG 08/22/2021: Sinus rhythm 66 bpm Normal EKG   CV: Echo 06/16/21: IMPRESSIONS   1. Left ventricular ejection fraction, by estimation, is 65 to 70%. The  left ventricle has normal function. The left ventricle has no regional  wall motion abnormalities. There is mild left ventricular hypertrophy.  Left ventricular diastolic parameters  are indeterminate. Elevated left atrial pressure.   2. Right ventricular systolic function is normal. The right ventricular  size is normal. Tricuspid regurgitation signal is inadequate for assessing  PA pressure.   3. Trivial pericardial effusion is present.The pericardial effusion is circumferential.   4. The mitral valve is normal in structure. No evidence of mitral valve  regurgitation. No evidence of mitral stenosis.   5. The aortic valve has an indeterminant number of cusps. Aortic valve  regurgitation is not  visualized. No aortic stenosis is present.   6. Aortic dilatation noted. There is mild dilatation of the ascending  aorta, measuring 38 mm.    BLE Venous US 04/20/21: Summary:  RIGHT:  - There is no evidence of deep vein thrombosis in the lower extremity.  - No cystic structure found in the popliteal fossa.  LEFT:  - There is no evidence of deep vein thrombosis in the lower extremity.  - No cystic  structure found in the popliteal fossa.    Past Medical History:  Diagnosis Date   Asthma    GERD (gastroesophageal reflux disease)    High cholesterol    under control.    History of blood transfusion    Hypothyroidism    Interstitial lung disease (HCC)    Multiple myeloma (HCC)    Perennial allergic rhinitis    Pneumonia    Sciatic pain, right    Seasonal allergic rhinitis    Thyroid disease     Past Surgical History:  Procedure Laterality Date   BRONCHIAL BIOPSY  06/18/2021   Procedure: BRONCHIAL BIOPSIES;  Surgeon: Collene Gobble, MD;  Location: [facility];  Service: Cardiopulmonary;;   BRONCHIAL WASHINGS  06/18/2021   Procedure: BRONCHIAL WASHINGS;  Surgeon: Collene Gobble, MD;  Location: [facility];  Service: Cardiopulmonary;;   NASAL SINUS [place] N/A 06/18/2021   Procedure: VIDEO BRONCHOSCOPY WITH FLUORO;  Surgeon: Collene Gobble, MD;  Location: [facility];  Service: Cardiopulmonary;  Laterality: N/A;    MEDICATIONS: No current facility-administered medications for this encounter.    acetaminophen (TYLENOL) 500 MG tablet   acyclovir (ZOVIRAX) 400 MG tablet   albuterol (VENTOLIN HFA) 108 (90 Base) MCG/ACT inhaler   allopurinol (ZYLOPRIM) 300 MG tablet   ALPRAZolam (XANAX) 1 MG tablet   aspirin EC 81 MG tablet   cholecalciferol (VITAMIN D) 1000 UNITS tablet   EPINEPHrine (EPI-PEN) 0.3 mg/0.3 mL DEVI   ezetimibe-simvastatin (VYTORIN) 10-40 MG per tablet   fluticasone-salmeterol (ADVAIR HFA) 230-21 MCG/ACT inhaler   furosemide (LASIX) 20 MG tablet   levothyroxine (SYNTHROID) 150 MCG tablet   levothyroxine (SYNTHROID) 175 MCG tablet   montelukast (SINGULAIR) 10 MG tablet   Multiple Vitamin (MULTIVITAMIN) tablet   omeprazole (PRILOSEC) 40 MG capsule   ondansetron (ZOFRAN) 8 MG tablet   OXYGEN   predniSONE (DELTASONE) 10 MG tablet   prochlorperazine (COMPAZINE) 10 MG tablet   sildenafil (REVATIO) 20 MG tablet    sodium chloride (OCEAN) 0.65 % SOLN nasal spray   tamsulosin (FLOMAX) 0.4 MG CAPS capsule   triamcinolone cream (KENALOG) 0.1 %   ASSESS FULL RANGE PEAK METER DEVI   lenalidomide ([name]) 10 MG capsule  Allopurinol, [name], currently on hold.  Myra Gianotti, PA-C Surgical Short Stay/Anesthesiology [facility] Phone [phone] [facility] Phone [phone] 08/27/2021 2:57 PM

## 2021-08-27 NOTE — Anesthesia Preprocedure Evaluation (Addendum)
Anesthesia Evaluation  Patient identified by MRN, date of birth, ID band Patient awake    Reviewed: Allergy & Precautions, NPO status , Patient's Chart, lab work & pertinent test results  Airway Mallampati: II  TM Distance: >3 FB Neck ROM: Full    Dental no notable dental hx.    Pulmonary shortness of breath, asthma ,  COPD inhaler, former smoker,  ILD    + decreased breath sounds      Cardiovascular negative cardio ROS   Rhythm:Regular Rate:Normal     Neuro/Psych Anxiety negative neurological ROS     GI/Hepatic Neg liver ROS, GERD  Medicated,  Endo/Other  Hypothyroidism   Renal/GU negative Renal ROS  negative genitourinary   Musculoskeletal negative musculoskeletal ROS (+)   Abdominal Normal abdominal exam  (+)   Peds  Hematology Multiple myeloma   Anesthesia Other Findings   Reproductive/Obstetrics                            Anesthesia Physical Anesthesia Plan  ASA: 3  Anesthesia Plan: MAC   Post-op Pain Management:    Induction: Intravenous  PONV Risk Score and Plan: 1 and Propofol infusion, Midazolam, Treatment may vary due to age or medical condition and TIVA  Airway Management Planned: Simple Face Mask, Natural Airway and Nasal Cannula  Additional Equipment: None  Intra-op Plan:   Post-operative Plan:   Informed Consent: I have reviewed the patients History and Physical, chart, labs and discussed the procedure including the risks, benefits and alternatives for the proposed anesthesia with the patient or authorized representative who has indicated his/her understanding and acceptance.     Dental advisory given  Plan Discussed with: CRNA  Anesthesia Plan Comments: (PAT note written 08/27/2021 by Myra Gianotti, PA-C. Lab Results      Component                Value               Date                      WBC                      13.1 (H)            08/03/2021                 HGB                      14.1                08/03/2021                HCT                      39.9                08/03/2021                MCV                      93.2                08/03/2021                PLT  191                 08/03/2021           Lab Results      Component                Value               Date                      NA                       128 (L)             08/03/2021                K                        3.7                 08/03/2021                CO2                      23                  08/03/2021                GLUCOSE                  123 (H)             08/03/2021                BUN                      25 (H)              08/03/2021                CREATININE               0.89                08/03/2021                CALCIUM                  8.6 (L)             08/03/2021                GFRNONAA                 >60                 08/03/2021             ECHO 08/22: 1. Left ventricular ejection fraction, by estimation, is 65 to 70%. The  left ventricle has normal function. The left ventricle has no regional  wall motion abnormalities. There is mild left ventricular hypertrophy.  Left ventricular diastolic parameters  are indeterminate. Elevated left atrial pressure.  2. Right ventricular systolic function is normal. The right ventricular  size is normal. Tricuspid regurgitation signal is inadequate for assessing  PA pressure.  3. The pericardial effusion is circumferential.  4. The mitral valve is normal in structure. No evidence of mitral valve  regurgitation. No evidence of mitral stenosis.  5.  The aortic valve has an indeterminant number of cusps. Aortic valve  regurgitation is not visualized. No aortic stenosis is present.  6. Aortic dilatation noted. There is mild dilatation of the ascending  aorta, measuring 38 mm. )       Anesthesia Quick Evaluation

## 2021-08-27 NOTE — Progress Notes (Signed)
Samuel [name] denies chest pain or shortness of breath at rest. Patient has  shortness ot breath when he walks. Samuel [name] reports that he can go to the gym and work out and not get short of breath. PCP is Dr. Quillian Quince  Cardiology for clearance- follow up if needed; Dr. Virgina Jock.  Pulmonologist - Dr. [name].  Oncologist is Gr Narda Rutherford.  Samuel [name] were's 2 liter of oxygen,  most of the time.   I instructed patient to shower with antibiotic soap, if it is available.  Dry off with a clean towel. Do not put lotion, powder, cologne or deodorant or makeup.No jewelry or piercings. Men may shave their face and neck. Woman should not shave. No nail polish, artificial or acrylic nails. Wear clean clothes, brush your teeth. Glasses, contact lens,dentures or partials may not be worn in the OR. If you need to wear them, please bring a case for glasses, do not wear contacts or bring a case, the hospital does not have contact cases, dentures or partials will have to be removed , make sure they are clean, we will provide a denture cup to put them in. You will need some one to drive you home and a responsible person over the age of 38 to stay with you for the first 24 hours after surgery.

## 2021-08-27 NOTE — Telephone Encounter (Signed)
BI please advise. thanks  

## 2021-08-28 ENCOUNTER — Ambulatory Visit (HOSPITAL_COMMUNITY): Payer: Managed Care, Other (non HMO)

## 2021-08-28 ENCOUNTER — Encounter (HOSPITAL_COMMUNITY)
Admission: RE | Disposition: A | Discharge status: Home / Self Care | Payer: Self-pay | Source: Home / Self Care | Attending: Pulmonary Disease

## 2021-08-28 ENCOUNTER — Ambulatory Visit (HOSPITAL_COMMUNITY)
Admission: RE | Admit: 2021-08-28 | Discharge: 2021-08-28 | Disposition: A | Discharge status: Home / Self Care | Payer: Managed Care, Other (non HMO) | Attending: Pulmonary Disease | Admitting: Pulmonary Disease

## 2021-08-28 ENCOUNTER — Ambulatory Visit (HOSPITAL_COMMUNITY): Payer: Managed Care, Other (non HMO) | Admitting: Vascular Surgery

## 2021-08-28 ENCOUNTER — Encounter (HOSPITAL_COMMUNITY): Payer: Self-pay | Admitting: Pulmonary Disease

## 2021-08-28 ENCOUNTER — Other Ambulatory Visit: Payer: Self-pay

## 2021-08-28 DIAGNOSIS — Z79890 Hormone replacement therapy (postmenopausal): Secondary | ICD-10-CM | POA: Diagnosis not present

## 2021-08-28 DIAGNOSIS — J449 Chronic obstructive pulmonary disease, unspecified: Secondary | ICD-10-CM | POA: Insufficient documentation

## 2021-08-28 DIAGNOSIS — Z79899 Other long term (current) drug therapy: Secondary | ICD-10-CM | POA: Insufficient documentation

## 2021-08-28 DIAGNOSIS — Z87891 Personal history of nicotine dependence: Secondary | ICD-10-CM | POA: Insufficient documentation

## 2021-08-28 DIAGNOSIS — Z9889 Other specified postprocedural states: Secondary | ICD-10-CM

## 2021-08-28 DIAGNOSIS — Z419 Encounter for procedure for purposes other than remedying health state, unspecified: Secondary | ICD-10-CM

## 2021-08-28 DIAGNOSIS — E039 Hypothyroidism, unspecified: Secondary | ICD-10-CM | POA: Insufficient documentation

## 2021-08-28 DIAGNOSIS — J849 Interstitial pulmonary disease, unspecified: Secondary | ICD-10-CM

## 2021-08-28 HISTORY — DX: Multiple myeloma not having achieved remission: C90.00

## 2021-08-28 HISTORY — DX: Interstitial pulmonary disease, unspecified: J84.9

## 2021-08-28 HISTORY — DX: Pneumonia, unspecified organism: J18.9

## 2021-08-28 HISTORY — PX: VIDEO BRONCHOSCOPY: SHX5072

## 2021-08-28 HISTORY — DX: Hypothyroidism, unspecified: E03.9

## 2021-08-28 HISTORY — PX: BRONCHIAL WASHINGS: SHX5105

## 2021-08-28 HISTORY — DX: Gastro-esophageal reflux disease without esophagitis: K21.9

## 2021-08-28 HISTORY — DX: Personal history of other medical treatment: Z92.89

## 2021-08-28 HISTORY — PX: BRONCHIAL BIOPSY: SHX5109

## 2021-08-28 LAB — BODY FLUID CELL COUNT WITH DIFFERENTIAL
Eos, Fluid: 0 %
Lymphs, Fluid: 40 %
Monocyte-Macrophage-Serous Fluid: 10 % — ABNORMAL LOW (ref 50–90)
Neutrophil Count, Fluid: 50 % — ABNORMAL HIGH (ref 0–25)
Total Nucleated Cell Count, Fluid: 183 cu mm (ref 0–1000)

## 2021-08-28 SURGERY — BRONCHOSCOPY, WITH FLUOROSCOPY
Anesthesia: General

## 2021-08-28 MED ORDER — LIDOCAINE 2% (20 MG/ML) 5 ML SYRINGE
INTRAMUSCULAR | Status: DC | PRN
Start: 2021-08-28 — End: 2021-08-28
  Administered 2021-08-28: 50 mg via INTRAVENOUS

## 2021-08-28 MED ORDER — CHLORHEXIDINE GLUCONATE 0.12% ORAL RINSE (MEDLINE KIT): 15.0000 mL | Freq: Once | OROMUCOSAL | Status: AC

## 2021-08-28 MED ORDER — ONDANSETRON HCL 4 MG/2ML IJ SOLN
INTRAMUSCULAR | Status: DC | PRN
  Administered 2021-08-28: 4 mg via INTRAVENOUS

## 2021-08-28 MED ORDER — MIDAZOLAM HCL 2 MG/2ML IJ SOLN
INTRAMUSCULAR | Status: DC | PRN
Start: 2021-08-28 — End: 2021-08-28
  Administered 2021-08-28: 2 mg via INTRAVENOUS

## 2021-08-28 MED ORDER — CHLORHEXIDINE GLUCONATE 0.12 % MT SOLN
OROMUCOSAL | Status: AC
  Administered 2021-08-28: 15 mL via OROMUCOSAL
  Filled 2021-08-28: qty 15

## 2021-08-28 MED ORDER — SUCCINYLCHOLINE CHLORIDE 200 MG/10ML IV SOSY
PREFILLED_SYRINGE | INTRAVENOUS | Status: DC | PRN
  Administered 2021-08-28: 140 mg via INTRAVENOUS

## 2021-08-28 MED ORDER — FENTANYL CITRATE (PF) 100 MCG/2ML IJ SOLN
25.0000 ug | INTRAMUSCULAR | Status: DC | PRN
Start: 2021-08-28 — End: 2021-08-29

## 2021-08-28 MED ORDER — PROPOFOL 10 MG/ML IV BOLUS
INTRAVENOUS | Status: DC | PRN
  Administered 2021-08-28: 200 mg via INTRAVENOUS

## 2021-08-28 MED ORDER — DEXAMETHASONE SODIUM PHOSPHATE 10 MG/ML IJ SOLN
INTRAMUSCULAR | Status: DC | PRN
Start: 2021-08-28 — End: 2021-08-28
  Administered 2021-08-28: 10 mg via INTRAVENOUS

## 2021-08-28 MED ORDER — LACTATED RINGERS IV SOLN: INTRAVENOUS | Status: DC

## 2021-08-28 MED ORDER — ACETAMINOPHEN 10 MG/ML IV SOLN: 1000.0000 mg | Freq: Once | INTRAVENOUS | Status: DC | PRN

## 2021-08-28 MED ORDER — FENTANYL CITRATE (PF) 100 MCG/2ML IJ SOLN
INTRAMUSCULAR | Status: DC | PRN
  Administered 2021-08-28: 100 ug via INTRAVENOUS

## 2021-08-28 NOTE — Op Note (Signed)
Video Bronchoscopy Procedure Note  Date of Operation: 08/28/2021  Pre-op Diagnosis: ILD   Post-op Diagnosis: ILD   Surgeon: Garner Nash, DO   Assistants: none  Anesthesia: General   Operation: Flexible video fiberoptic bronchoscopy and biopsies.  Estimated Blood Loss: <2HE  Complications: none noted  Indications and History: Samuel [name] is 65 y.o. with history of ILD.  Recommendation was to perform video fiberoptic bronchoscopy with biopsies. The risks, benefits, complications, treatment options and expected outcomes were discussed with the patient.  The possibilities of pneumothorax, pneumonia, reaction to medication, pulmonary aspiration, perforation of a viscus, bleeding, failure to diagnose a condition and creating a complication requiring transfusion or operation were discussed with the patient who freely signed the consent.    Description of Procedure: The patient was seen in the Preoperative Area, was examined and was deemed appropriate to proceed.  The patient was taken to [facility] endoscopy room 3, identified as Samuel [name] and the procedure verified as Flexible Video Fiberoptic Bronchoscopy.  A Time Out was held and the above information confirmed.   Standard therapy bronchoscope was inserted through the patient's endotracheal tube placed by anesthesia.  Bilateral airways were examined.  The right upper lobe right lower lobe right middle lobe, left upper lobe, left lower lobe subsegments all appeared normal with no evidence of endobronchial lesion.  A BAL was obtained from the right lower lobe after 120 cc of saline instillation.  Under direct fluoroscopic guidance the 2.8 mm [employer] forceps were used for tissue biopsy of the right lower lobe and right upper lobe locations for the in [employer].  Samples: 1.  Bronchoalveolar lavage right lower lobe 2.  Transbronchial biopsies right upper lobe and right lower lobe  Plans:  We will review the  cytology, pathology and microbiology results with the patient when they become available.  Outpatient followup will be with Dr. [name].  Garner Nash, DO [facility] 08/28/2021 5:36 PM

## 2021-08-28 NOTE — Interval H&P Note (Signed)
History and Physical Interval Note:  08/28/2021 4:27 PM  Samuel [name]  has presented today for surgery, with the diagnosis of ILD with BAL.  The various methods of treatment have been discussed with the patient and family. After consideration of risks, benefits and other options for treatment, the patient has consented to  Procedure(s): VIDEO BRONCHOSCOPY WITH FLUORO (N/A) as a surgical intervention.  The patient's history has been reviewed, patient examined, no change in status, stable for surgery.  I have reviewed the patient's chart and labs.  Questions were answered to the patient's satisfaction.     [place]

## 2021-08-28 NOTE — Anesthesia Procedure Notes (Signed)
Procedure Name: Intubation Date/Time: 08/28/2021 4:45 PM Performed by: Moshe Salisbury, CRNA Pre-anesthesia Checklist: Patient identified, Emergency Drugs available, Suction available and Patient being monitored Patient Re-evaluated:Patient Re-evaluated prior to induction Oxygen Delivery Method: Circle System Utilized Preoxygenation: Pre-oxygenation with 100% oxygen Induction Type: IV induction Ventilation: Mask ventilation without difficulty Laryngoscope Size: Mac and 4 Grade View: Grade I Tube type: Oral Tube size: 9.0 mm Number of attempts: 1 Airway Equipment and Method: Stylet Placement Confirmation: ETT inserted through vocal cords under direct vision, positive ETCO2 and breath sounds checked- equal and bilateral Secured at: 23 cm Tube secured with: Tape Dental Injury: Teeth and Oropharynx as per pre-operative assessment

## 2021-08-28 NOTE — Discharge Instructions (Signed)
Flexible Bronchoscopy, Care After This sheet gives you information about how to care for yourself after your test. Your doctor may also give you more specific instructions. If you have problems or questions, contact your doctor. Follow these instructions at home: Eating and drinking Do not eat or drink anything (not even water) for 2 hours after your test, or until your numbing medicine (local anesthetic) wears off. When your numbness is gone and your cough and gag reflexes have come back, you may: Eat only soft foods. Slowly drink liquids. The day after the test, go back to your normal diet. Driving Do not drive for 24 hours if you were given a medicine to help you relax (sedative). Do not drive or use heavy machinery while taking prescription pain medicine. General instructions  Take over-the-counter and prescription medicines only as told by your doctor. Return to your normal activities as told. Ask what activities are safe for you. Do not use any products that have nicotine or tobacco in them. This includes cigarettes and e-cigarettes. If you need help quitting, ask your doctor. Keep all follow-up visits as told by your doctor. This is important. It is very important if you had a tissue sample (biopsy) taken. Get help right away if: You have shortness of breath that gets worse. You get light-headed. You feel like you are going to pass out (faint). You have chest pain. You cough up: More than a little blood. More blood than before. Summary Do not eat or drink anything (not even water) for 2 hours after your test, or until your numbing medicine wears off. Do not use cigarettes. Do not use e-cigarettes. Get help right away if you have chest pain.  This information is not intended to replace advice given to you by your health care provider. Make sure you discuss any questions you have with your health care provider. Document Released: 08/04/2009 Document Revised: 09/19/2017 Document  Reviewed: 10/25/2016 Elsevier Patient Education  2020 [employer].

## 2021-08-28 NOTE — Telephone Encounter (Signed)
MR, please see the message from Richardson Landry and advise.

## 2021-08-28 NOTE — Progress Notes (Signed)
Patient is stable, vitals not needed at this time. In phase 2 at 1845, awaiting confirmation on xray from radiology.

## 2021-08-28 NOTE — Transfer of Care (Signed)
Immediate Anesthesia Transfer of Care Note  Patient: Samuel [name]  Procedure(s) Performed: VIDEO BRONCHOSCOPY WITH FLUORO BRONCHIAL WASHINGS BRONCHIAL BIOPSIES  Patient Location: PACU  Anesthesia Type:General  Level of Consciousness: drowsy and patient cooperative  Airway & Oxygen Therapy: Patient Spontanous Breathing and Patient connected to nasal cannula oxygen  Post-op Assessment: Report given to RN, Post -op Vital signs reviewed and stable and Patient moving all extremities  Post vital signs: Reviewed and stable  Last Vitals:  Vitals Value Taken Time  BP 141/96 08/28/21 1713  Temp    Pulse 85 08/28/21 1714  Resp 24 08/28/21 1714  SpO2 98 % 08/28/21 1714  Vitals shown include unvalidated device data.  Last Pain:  Vitals:   08/28/21 1404  PainSc: 0-No pain      Patients Stated Pain Goal: 3 (54/62/70 3500)  Complications: No notable events documented.

## 2021-08-29 ENCOUNTER — Other Ambulatory Visit: Payer: Self-pay

## 2021-08-29 ENCOUNTER — Telehealth: Payer: Self-pay | Admitting: Internal Medicine

## 2021-08-29 ENCOUNTER — Encounter (HOSPITAL_COMMUNITY): Payer: Self-pay | Admitting: Pulmonary Disease

## 2021-08-29 ENCOUNTER — Other Ambulatory Visit: Payer: Self-pay | Admitting: Pharmacy Technician

## 2021-08-29 LAB — ACID FAST SMEAR (AFB, MYCOBACTERIA): Acid Fast Smear: NEGATIVE

## 2021-08-29 NOTE — Telephone Encounter (Signed)
Triage: plsease schedule video visit with me (not face to face) for Friday 08/31/21 to go over bronch results and discussion of IV steroids  Infusion center: Please do not call/schedule patient as yet. I plant to update him and get consent later this week

## 2021-08-29 NOTE — Telephone Encounter (Signed)
I called to leave a message for the patient to set up a Mychart visit on 08/31/21 with Dr. Onnie Graham in the afternoon to go over his results.   Needs Mychart video visit on 08/31/21 per provider.

## 2021-08-29 NOTE — Telephone Encounter (Signed)
Noted.   Please let us know if there is anything else we need to do from clinical standpoint. It appears the infusion center will take it from here.

## 2021-08-29 NOTE — Telephone Encounter (Signed)
Spoke with the pt and scheduled mychart video visit for 08/31/21 at 4:30 pm

## 2021-08-29 NOTE — Telephone Encounter (Signed)
Noted. Ty Kim

## 2021-08-29 NOTE — Telephone Encounter (Signed)
See other note. Closing encounter.  

## 2021-08-29 NOTE — Telephone Encounter (Signed)
He Samuel [name] had bronch BAL yesterday. Cultures pending. Assuming negative cultures- plan for next week during M-F 3 days of 589m IV Bid solumedrol daily x 3 days. Will need BP.sugar check before/after admin and also some lab work on final day  Possible?

## 2021-08-30 LAB — MISC LABCORP TEST (SEND OUT): Labcorp test code: 183510

## 2021-08-30 LAB — MTB RIF NAA NON-SPUTUM, W/O CULTURE

## 2021-08-31 ENCOUNTER — Telehealth: Payer: Self-pay | Admitting: Internal Medicine

## 2021-08-31 ENCOUNTER — Other Ambulatory Visit: Payer: Self-pay

## 2021-08-31 ENCOUNTER — Telehealth (INDEPENDENT_AMBULATORY_CARE_PROVIDER_SITE_OTHER): Payer: Managed Care, Other (non HMO) | Admitting: Internal Medicine

## 2021-08-31 ENCOUNTER — Encounter: Payer: Self-pay | Admitting: *Deleted

## 2021-08-31 ENCOUNTER — Inpatient Hospital Stay: Payer: Managed Care, Other (non HMO) | Attending: Hematology and Oncology

## 2021-08-31 ENCOUNTER — Inpatient Hospital Stay (HOSPITAL_BASED_OUTPATIENT_CLINIC_OR_DEPARTMENT_OTHER): Payer: Managed Care, Other (non HMO) | Admitting: Hematology and Oncology

## 2021-08-31 ENCOUNTER — Ambulatory Visit: Payer: Managed Care, Other (non HMO)

## 2021-08-31 VITALS — BP 155/91 | HR 65 | Temp 96.2°F | Resp 17 | Wt 210.8 lb

## 2021-08-31 DIAGNOSIS — J189 Pneumonia, unspecified organism: Secondary | ICD-10-CM | POA: Diagnosis not present

## 2021-08-31 DIAGNOSIS — C9000 Multiple myeloma not having achieved remission: Secondary | ICD-10-CM | POA: Insufficient documentation

## 2021-08-31 DIAGNOSIS — Z87891 Personal history of nicotine dependence: Secondary | ICD-10-CM | POA: Insufficient documentation

## 2021-08-31 DIAGNOSIS — Z79899 Other long term (current) drug therapy: Secondary | ICD-10-CM | POA: Diagnosis not present

## 2021-08-31 DIAGNOSIS — Z9221 Personal history of antineoplastic chemotherapy: Secondary | ICD-10-CM | POA: Diagnosis not present

## 2021-08-31 DIAGNOSIS — J849 Interstitial pulmonary disease, unspecified: Secondary | ICD-10-CM | POA: Diagnosis not present

## 2021-08-31 DIAGNOSIS — T50905A Adverse effect of unspecified drugs, medicaments and biological substances, initial encounter: Secondary | ICD-10-CM

## 2021-08-31 DIAGNOSIS — Z7982 Long term (current) use of aspirin: Secondary | ICD-10-CM | POA: Insufficient documentation

## 2021-08-31 DIAGNOSIS — Z7952 Long term (current) use of systemic steroids: Secondary | ICD-10-CM

## 2021-08-31 DIAGNOSIS — Z8579 Personal history of other malignant neoplasms of lymphoid, hematopoietic and related tissues: Secondary | ICD-10-CM | POA: Diagnosis not present

## 2021-08-31 LAB — LACTATE DEHYDROGENASE: LDH: 331 U/L — ABNORMAL HIGH (ref 98–192)

## 2021-08-31 LAB — CBC WITH DIFFERENTIAL (CANCER CENTER ONLY)
Abs Immature Granulocytes: 0.04 10*3/uL (ref 0.00–0.07)
Basophils Absolute: 0 10*3/uL (ref 0.0–0.1)
Basophils Relative: 0 %
Eosinophils Absolute: 0.1 10*3/uL (ref 0.0–0.5)
Eosinophils Relative: 1 %
HCT: 41.1 % (ref 39.0–52.0)
Hemoglobin: 14.1 g/dL (ref 13.0–17.0)
Immature Granulocytes: 0 %
Lymphocytes Relative: 19 %
Lymphs Abs: 1.7 10*3/uL (ref 0.7–4.0)
MCH: 32.1 pg (ref 26.0–34.0)
MCHC: 34.3 g/dL (ref 30.0–36.0)
MCV: 93.6 fL (ref 80.0–100.0)
Monocytes Absolute: 0.8 10*3/uL (ref 0.1–1.0)
Monocytes Relative: 9 %
Neutro Abs: 6.7 10*3/uL (ref 1.7–7.7)
Neutrophils Relative %: 71 %
Platelet Count: 171 10*3/uL (ref 150–400)
RBC: 4.39 MIL/uL (ref 4.22–5.81)
RDW: 15.5 % (ref 11.5–15.5)
WBC Count: 9.3 10*3/uL (ref 4.0–10.5)
nRBC: 0 % (ref 0.0–0.2)

## 2021-08-31 LAB — CMP (CANCER CENTER ONLY)
ALT: 26 U/L (ref 0–44)
AST: 19 U/L (ref 15–41)
Albumin: 3.5 g/dL (ref 3.5–5.0)
Alkaline Phosphatase: 42 U/L (ref 38–126)
Anion gap: 8 (ref 5–15)
BUN: 16 mg/dL (ref 8–23)
CO2: 25 mmol/L (ref 22–32)
Calcium: 8.6 mg/dL — ABNORMAL LOW (ref 8.9–10.3)
Chloride: 94 mmol/L — ABNORMAL LOW (ref 98–111)
Creatinine: 0.78 mg/dL (ref 0.61–1.24)
GFR, Estimated: >60 mL/min (ref >60)
Glucose, Bld: 100 mg/dL — ABNORMAL HIGH (ref 70–99)
Potassium: 3.9 mmol/L (ref 3.5–5.1)
Sodium: 127 mmol/L — ABNORMAL LOW (ref 135–145)
Total Bilirubin: 0.4 mg/dL (ref 0.3–1.2)
Total Protein: 7 g/dL (ref 6.5–8.1)

## 2021-08-31 LAB — CULTURE, BAL-QUANTITATIVE W GRAM STAIN: Culture: NO GROWTH

## 2021-08-31 MED ORDER — SULFAMETHOXAZOLE-TRIMETHOPRIM 800-160 MG PO TABS: 1.0000 | ORAL_TABLET | ORAL | 3 refills | Status: DC

## 2021-08-31 NOTE — Progress Notes (Signed)
[place] [phone]   [phone]  PROGRESS NOTE  Patient Care Team: Antony Contras, MD as PCP - General (Family Medicine)  Hematological/Oncological History # IgG Kappa Multiple Myeloma 02/02/2021:  Presented to [facility] ED due to right sided flank tenderness with bruising. CT abdomen/pelvis: Multiple small lytic lesions in the thoracolumbar spine and bilateral pelvis -SPEP: IgG 2,082 (H), M Protein 1.8 (H). IFE shows IgG monoclonal protein with kappa light chain specificity.  -LDH 169, CBC normal, CMP normal except for sodium 131 (L), Chloride 96 (L).   02/14/2021: Establish care with Dede Query PA-C 02/22/2021: bone marrow biopsy confirms the diagnosis of Multiple Myeloma with a monoclonal plasma cell population.  03/16/2021: Cycle 1 Day 1 of VRd chemotherapy  04/06/2021: Cycle 2 Day 1 of VRd chemotherapy  04/27/2021: Cycle 3 Day 1 of VRd chemotherapy  05/18/2021: Cycle 4 Day 1 of VRd chemotherapy  06/01/2021: drop dexamethasone to 28m PO weekly and start lasix due to shortness of breath.  06/15/2021: Desaturation to 87% on ambulation. HELD velcade today and sent to ED for evaluation.  06/22/2021: Findings consistent with drug reaction the lungs with eosinophils on BAL.  Given these findings we will definitely hold [name] and plan to avoid pomalidomide 06/29/2021: Cycle 1 Day 1 CyBorD chemotherapy 07/05/2021: HOLD chemotherapy given worsening lung function  Interval History:  Samuel [name] 65y.o. male with medical history significant for IgG Kappa multiple myeloma who presents for a follow up visit. The patient's last visit was on 08/03/2021.  In the interim since last visit he underwent a bronchoscopy with final results pending.   On exam today Samuel [name] is accompanied by his wife.  He notes that he has been improving modestly in the interim since her last visit.  He underwent a bronchoscopy recently with the results currently pending.  He plans on  discussing this with Dr. RChase Callertoday.  He reports that he is having a lot of upper respiratory congestion as well as chest tightness.  He is also evaluated by cardiology who did not find any cardiac issue to explain his current shortness of breath.  He notes no other new focal symptoms at this time.  Given the patient's respiratory status we decided to hold on further treatments of chemotherapy until his respiratory status improved. He denies any fevers, chills, sweats, nausea, vomiting or diarrhea.  A full 10 point ROS is listed below.  MEDICAL HISTORY:  Past Medical History:  Diagnosis Date   Asthma    GERD (gastroesophageal reflux disease)    High cholesterol    under control.    History of blood transfusion    Hypothyroidism    Interstitial lung disease ([facility]    Multiple myeloma ([facility])    Perennial allergic rhinitis    Pneumonia    Sciatic pain, right    Seasonal allergic rhinitis    Thyroid disease     SURGICAL HISTORY: Past Surgical History:  Procedure Laterality Date   BRONCHIAL BIOPSY  06/18/2021   Procedure: BRONCHIAL BIOPSIES;  Surgeon: BCollene Gobble MD;  Location: [facility];  Service: Cardiopulmonary;;   BRONCHIAL BIOPSY  08/28/2021   Procedure: BRONCHIAL BIOPSIES;  Surgeon: IGarner Nash DO;  [facility]  Service: Cardiopulmonary;;   BRONCHIAL WASHINGS  06/18/2021   Procedure: BRONCHIAL WASHINGS;  Surgeon: BCollene Gobble MD;  Location: [facility];  Service: Cardiopulmonary;;   BRONCHIAL WASHINGS  08/28/2021   Procedure: BRONCHIAL WASHINGS;  Surgeon: IGarner Nash DO;  Location:  [facility];  Service: Cardiopulmonary;;   NASAL SINUS SURGERY     NECK SURGERY  1996   VIDEO BRONCHOSCOPY N/A 06/18/2021   Procedure: VIDEO BRONCHOSCOPY WITH FLUORO;  Surgeon: Collene Gobble, MD;  Location: [facility];  Service: Cardiopulmonary;  Laterality: N/A;   VIDEO BRONCHOSCOPY N/A 08/28/2021   Procedure: VIDEO BRONCHOSCOPY WITH FLUORO;  Surgeon: Garner Nash, DO;  Location: [facility];  Service: Cardiopulmonary;  Laterality: N/A;    SOCIAL HISTORY: Social History   Socioeconomic History   Marital status: Married    Spouse name: Not on file   Number of children: 3   Years of education: Not on file   Highest education level: Not on file  Occupational History   Not on file  Tobacco Use   Smoking status: Former    Packs/day: 0.10    Years: 3.00    Pack years: 0.30    Types: Cigarettes    Quit date: 10/21/1982    Years since quitting: 38.9   Smokeless tobacco: Never  Vaping Use   Vaping Use: Never used  Substance and Sexual Activity   Alcohol use: Not Currently    Comment: 1 drink daily   Drug use: No   Sexual activity: Not on file  Other Topics Concern   Not on file  Social History Narrative   Not on file   Social Determinants of Health   Financial Resource Strain: Not on file  Food Insecurity: Not on file  Transportation Needs: Not on file  Physical Activity: Not on file  Stress: Not on file  Social Connections: Not on file  Intimate Partner Violence: Not on file    FAMILY HISTORY: Family History  Problem Relation Age of Onset   Hypertension Mother    Allergies Mother    Allergies Father    CAD Father 69   Breast cancer Paternal Grandmother    Hypertension Other    Heart disease Other     ALLERGIES:  is allergic to clarithromycin and penicillins.  MEDICATIONS:  Current Outpatient Medications  Medication Sig Dispense Refill   acetaminophen (TYLENOL) 500 MG tablet Take 1,000 mg by mouth every 6 (six) hours as needed for mild pain, moderate pain or headache.     acyclovir (ZOVIRAX) 400 MG tablet Take 1 tablet (400 mg total) by mouth 2 (two) times daily. 60 tablet 3   albuterol (VENTOLIN HFA) 108 (90 Base) MCG/ACT inhaler Inhale 2 puffs into the lungs every 6 hours as needed for wheezing or shortness of breath. 8.5 g 1   allopurinol (ZYLOPRIM) 300 MG tablet Take 300 mg by mouth daily.     ALPRAZolam  (XANAX) 1 MG tablet Take 0.33 mg by mouth at bedtime.     aspirin EC 81 MG tablet Take 81 mg by mouth daily. Swallow whole.     ASSESS FULL RANGE PEAK METER DEVI as directed.     cholecalciferol (VITAMIN D) 1000 UNITS tablet Take 3,000 Units by mouth daily.     EPINEPHrine (EPI-PEN) 0.3 mg/0.3 mL DEVI Inject 0.3 mg into the muscle as needed.     ezetimibe-simvastatin (VYTORIN) 10-40 MG per tablet Take 1 tablet by mouth at bedtime.     fluticasone-salmeterol (ADVAIR HFA) 230-21 MCG/ACT inhaler Inhale 2 puffs into the lungs 2 (two) times daily. 1 each 12   furosemide (LASIX) 20 MG tablet Take 1 tablet (20 mg total) by mouth as needed for fluid or edema. 14 tablet 1   lenalidomide ([name]) 10 MG  capsule TAKE 1 CAPSULE DAILY FOR 14 DAYS, NONE FOR 7 DAYS 14 capsule 0   levothyroxine (SYNTHROID) 150 MCG tablet Take 150 mcg by mouth every other day. Alternate     levothyroxine (SYNTHROID) 175 MCG tablet Take 1 tablet by mouth every other day.     montelukast (SINGULAIR) 10 MG tablet Take 10 mg by mouth at bedtime.     Multiple Vitamin (MULTIVITAMIN) tablet Take 1 tablet by mouth daily.     omeprazole (PRILOSEC) 40 MG capsule Take 1 capsule by mouth daily.     ondansetron (ZOFRAN) 8 MG tablet Take 1 tablet (8 mg total) by mouth every 8 (eight) hours as needed for nausea or vomiting. 30 tablet 0   OXYGEN Inhale 2 L into the lungs continuous.     predniSONE (DELTASONE) 10 MG tablet Take 5 tablets (50 mg total) by mouth daily with breakfast for 14 days, THEN 4 tablets (40 mg total) daily with breakfast for 14 days, THEN 3 tablets (30 mg total) daily with breakfast for 14 days, THEN 2 tablets (20 mg total) daily with breakfast for 14 days. Once you reach 69m, remain on 268muntil further notice.. 196 tablet 0   prochlorperazine (COMPAZINE) 10 MG tablet Take 1 tablet (10 mg total) by mouth every 6 (six) hours as needed for nausea or vomiting. 30 tablet 0   sildenafil (REVATIO) 20 MG tablet Take 20 mg by  mouth daily as needed (ED).     sodium chloride (OCEAN) 0.65 % SOLN nasal spray Place 1 spray into both nostrils as needed for congestion.     sulfamethoxazole-trimethoprim (BACTRIM DS) 800-160 MG tablet Take 1 tablet by mouth 3 (three) times a week. 12 tablet 3   tamsulosin (FLOMAX) 0.4 MG CAPS capsule Take 0.4 mg by mouth daily.     triamcinolone cream (KENALOG) 0.1 % Apply 1 application topically daily as needed (sun burn itch).     No current facility-administered medications for this visit.   Facility-Administered Medications Ordered in Other Visits  Medication Dose Route Frequency Provider Last Rate Last Admin   0.9 %  sodium chloride infusion   Intravenous Once PRN RaBrand MalesMD       0.9 %  sodium chloride infusion   Intravenous Once PRN RaBrand MalesMD       albuterol (VENTOLIN HFA) 108 (90 Base) MCG/ACT inhaler 2 puff  2 puff Inhalation Once PRN RaBrand MalesMD       albuterol (VENTOLIN HFA) 108 (90 Base) MCG/ACT inhaler 2 puff  2 puff Inhalation Once PRN RaBrand MalesMD       diphenhydrAMINE (BENADRYL) injection 50 mg  50 mg Intravenous Once PRN RaBrand MalesMD       diphenhydrAMINE (BENADRYL) injection 50 mg  50 mg Intravenous Once PRN RaBrand MalesMD       EPINEPHrine (EPI-PEN) injection 0.3 mg  0.3 mg Intramuscular Once PRN RaBrand MalesMD       EPINEPHrine (EPI-PEN) injection 0.3 mg  0.3 mg Intramuscular Once PRN RaBrand MalesMD       famotidine (PEPCID) IVPB 20 mg premix  20 mg Intravenous Once PRN RaBrand MalesMD       famotidine (PEPCID) IVPB 20 mg premix  20 mg Intravenous Once PRN RaBrand MalesMD       methylPREDNISolone sodium succinate (SOLU-MEDROL) 125 mg/2 mL injection 125 mg  125 mg Intravenous Once PRN RaBrand MalesMD       methylPREDNISolone sodium succinate (SOLU-MEDROL)  125 mg/2 mL injection 125 mg  125 mg Intravenous Once PRN Brand Males, MD       methylPREDNISolone sodium succinate  (SOLU-MEDROL) 500 mg in sodium chloride 0.9 % 50 mL IVPB  500 mg Intravenous Once Brand Males, MD       methylPREDNISolone sodium succinate (SOLU-MEDROL) 500 mg in sodium chloride 0.9 % 50 mL IVPB  500 mg Intravenous Once Brand Males, MD       methylPREDNISolone sodium succinate (SOLU-MEDROL) 500 mg in sodium chloride 0.9 % 50 mL IVPB  500 mg Intravenous Once Brand Males, MD        REVIEW OF SYSTEMS:   Constitutional: ( - ) fevers, ( - )  chills , ( - ) night sweats Eyes: ( - ) blurriness of vision, ( - ) double vision, ( - ) watery eyes Ears, nose, mouth, throat, and face: ( - ) mucositis, ( - ) sore throat Respiratory: ( - ) cough, ( - ) dyspnea, ( - ) wheezes Cardiovascular: ( - ) palpitation, ( - ) chest discomfort, ( - ) lower extremity swelling Gastrointestinal:  ( - ) nausea, ( - ) heartburn, ( - ) change in bowel habits Skin: ( - ) abnormal skin rashes Lymphatics: ( - ) new lymphadenopathy, ( - ) easy bruising Neurological: ( - ) numbness, ( - ) tingling, ( - ) new weaknesses Behavioral/Psych: ( - ) mood change, ( - ) new changes  All other systems were reviewed with the patient and are negative.  PHYSICAL EXAMINATION: ECOG PERFORMANCE STATUS: 0 - Asymptomatic  Vitals:   08/31/21 1105  BP: (!) 155/91  Pulse: 65  Resp: 17  Temp: (!) 96.2 F (35.7 C)  SpO2: 95%     Filed Weights   08/31/21 1105  Weight: 210 lb 12.8 oz (95.6 kg)    GENERAL: well appearing middle aged Caucasian male alert, no distress and comfortable SKIN: skin color, texture, turgor are normal, no rashes or significant lesions EYES: conjunctiva are pink and non-injected, sclera clear LUNGS: Decreased airflow with no crackles or rails.  No wheezing noted. HEART: regular rate & rhythm and no murmurs and no lower extremity edema Musculoskeletal: no cyanosis of digits and no clubbing  PSYCH: alert & oriented x 3, fluent speech NEURO: no focal motor/sensory deficits  LABORATORY DATA:   I have reviewed the data as listed CBC Latest Ref Rng & Units 08/31/2021 08/03/2021 07/18/2021  WBC 4.0 - 10.5 K/uL 9.3 13.1(H) 9.8  Hemoglobin 13.0 - 17.0 g/dL 14.1 14.1 13.2  Hematocrit 39.0 - 52.0 % 41.1 39.9 38.2(L)  Platelets 150 - 400 K/uL 171 191 271    CMP Latest Ref Rng & Units 08/31/2021 08/03/2021 07/18/2021  Glucose 70 - 99 mg/dL 100(H) 123(H) 79  BUN 8 - 23 mg/dL 16 25(H) 25(H)  Creatinine 0.61 - 1.24 mg/dL 0.78 0.89 0.85  Sodium 135 - 145 mmol/L 127(L) 128(L) 129(L)  Potassium 3.5 - 5.1 mmol/L 3.9 3.7 4.1  Chloride 98 - 111 mmol/L 94(L) 99 95(L)  CO2 22 - 32 mmol/L _0 Calcium 8.9 - 10.3 mg/dL 8.6(L) 8.6(L) 8.5(L)  Total Protein 6.5 - 8.1 g/dL 7.0 7.0 6.9  Total Bilirubin 0.3 - 1.2 mg/dL 0.4 0.7 0.5  Alkaline Phos 38 - 126 U/L 42 57 68  AST 15 - 41 U/L _1 ALT 0 - 44 U/L _2 Lab Results  Component Value Date   MPROTEIN Not Observed 08/31/2021  MPROTEIN Not Observed 08/03/2021   MPROTEIN Not Observed 06/29/2021   Lab Results  Component Value Date   KPAFRELGTCHN 22.8 (H) 08/31/2021   KPAFRELGTCHN 28.7 (H) 08/03/2021   KPAFRELGTCHN 22.6 (H) 06/29/2021   LAMBDASER 10.8 08/31/2021   LAMBDASER 13.1 08/03/2021   LAMBDASER 11.2 06/29/2021   KAPLAMBRATIO 2.11 (H) 08/31/2021   KAPLAMBRATIO 2.19 (H) 08/03/2021   KAPLAMBRATIO 2.02 (H) 06/29/2021    RADIOGRAPHIC STUDIES: I have personally reviewed the radiological images as listed and agreed with the findings in the report: lytic lesions in the hip bones bilaterally.  DG CHEST PORT 1 VIEW  Result Date: 08/28/2021 CLINICAL DATA:  Status post bronchoscopy. EXAM: PORTABLE CHEST 1 VIEW COMPARISON:  Chest radiographs 07/20/2021 and CT 07/30/2021 FINDINGS: The cardiac silhouette is borderline enlarged. Lung volumes are chronically low and slightly lower than on the prior radiographs. The interstitial markings are chronically increased diffusely. No definite acute airspace consolidation, overt pulmonary  edema, sizable pleural effusion, or pneumothorax is identified. Prominent gaseous distension of the stomach is partially visualized. IMPRESSION: Low lung volumes with chronic interstitial changes. Electronically Signed   By: Logan Bores M.D.   On: 08/28/2021 19:10   DG C-ARM BRONCHOSCOPY  Result Date: 08/28/2021 C-ARM BRONCHOSCOPY: Fluoroscopy was utilized by the requesting physician.  No radiographic interpretation.     ASSESSMENT & PLAN Samuel [name] 65 y.o. male with medical history significant for IgG Kappa multiple myeloma who presents for a follow up visit.   After review of the labs, review of the records, and discussion with the patient the findings are most consistent with an IgG kappa multiple myeloma.  The patient has 2 lytic lesions within the pelvic bones (more noted in spine on CT scan) as well as 10% plasma cells within the bone marrow biopsy.  This combined with his serological findings confirm the diagnosis of multiple myeloma.  The initial treatment of choice for this patient's multiple myeloma was VRd. This will consist of bortezomib 1.47m/m2 on days 1, 8, 15, dexamethasone 466mon days 1,8, and 15, and revlimid 2527mO daily days 1-14. Cycles will consist of 21 days. We will use this regimen to stabilize the patient's myeloma, then make a referral to a BMT center of his choosing for consideration of a bone marrow transplant once he has been noted to have a good response (VGPR or better). Zometa was started after dental clearance was obtained. This will be continued x 2 years.   Unfortunately had a drug reaction to [name] and was admitted to the hospital from 06/15/2021 until 06/18/2021.  The patient underwent a B AL which clearly showed evidence of eosinophils.  This is consistent with a drug reaction most likely caused by the patient's [name].  Discussed the case with pulmonology who recommended that we not rechallenge for attempt other drugs in the same class such as  pomalidomide.  Given the patient's excellent [facility] might preference would be to continue that.  As such I would recommend we proceed with CyBorD chemotherapy.  Daratumumab-based therapy could have been considered, but is often paired with an immunologic.  Additionally I would like to preserve those for additional lines of therapy if necessary.  Therefore we will proceed with CyBorD chemotherapy have the patient referred to transplant.  R-IPSS score: Stage 2. PFS of 42 months  # IgG Kappa Multiple Myeloma (t11;14, standard risk)  --diagnostic criteria was met with monoclonal plasma cells in the bone marrow and lytic lesions on the bone --recommend  proceeding with VRd chemotherapy as noted above --patient is young and healthy enough for consideration of BMT, though he is borderline with his age. Will consider referral pending response to treatment.  -- Cycle 1 Day 1 started on 03/16/2021 --decreased dexamethasone to 90m PO weekly due to fluid overload. Also started lasix 258mPO (changed on 06/01/2021) --plan to HOLD revlimid moving forward after evidence of drug reaction in the lungs. --started CyBorD chemotherapy on 06/29/2021 --patient has reached a VGPR. [facility]  Plan: --will HOLD chemotherapy at this time, allowing his respiratory status to recuperate and pulmonology to help optimize his breathing prior to rechallenging him --ILD is not improving on steroid therapy. Pulmonology performed bronchoscopy with biopsies and BAL, results currently pending. --At the risk of worsening his lung function will continue to hold therapy.  --RTC 4 weeks to readdress.   #Drug Reaction in the Lung --confirmed with increased eosinophils on BAL during admission for shortness of breath --currently on a steroid taper, recently dose increased and restarted.  --will need to hold revlimid and and avoid the use of pomalidomide moving forward. Do no  recommend rechallenge --patient will continue to follow up with pulmonology.   #Supportive Care -- chemotherapy education complete --zometa therapy started on 04/05/2021 (4 mg IV q 3 months), next dose Dec 2022 --ASA 8139mO daily for thromboprophylaxis on revlimid -- zofran 8mg43mH PRN and compazine 10mg50mq6H for nausea -- acyclovir 400mg 65mID for VCZ prophylaxis -- no pain medication required at this time.   No orders of the defined types were placed in this encounter.  All questions were answered. The patient knows to call the clinic with any problems, questions or concerns.  A total of more than 30 minutes were spent on this encounter with face-to-face time and non-face-to-face time, including preparing to see the patient, ordering tests and/or medications, counseling the patient and coordination of care as outlined above.   Cordelro Gautreau TLedell Peoplesepartment of Hematology/Oncology [facility]: [phone]: [phone]: [email or web address]  09/05/2021 9:02 AM

## 2021-08-31 NOTE — Telephone Encounter (Signed)
Samuel [name] has been consented for IV Solu-Medrol 20 mg twice daily x3 days.  He prefers to do it 09/04/2021, 09/05/2021, 09/06/2021.  If this does not work he is willing to do 09/03/2021, 09/04/2021, 09/05/2021.  He requires early morning dose and then a late evening dose first part of the shift and last part of the shift.  He requires vital sign monitoring and blood sugar check before, during and after the infusion  -

## 2021-08-31 NOTE — Patient Instructions (Addendum)
ICD-10-CM   1. ILD (interstitial lung disease) ([place])  J84.9     2. 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 Current chronic use of systemic steroids  Z79.52     4. History of multiple myeloma  Z85.79      Stable overall without changed NO bacterial infectio bronch 08/28/21  plan Outpatient pulse steroids Solumedrol  524m IV Twice daily x 3 days  - aim for Tuesday 11/15, 11/16 and 09/06/21 Continue prednisone 214m but hold during days of IV steroids Start bactrim 1 DS - Monday, Wed, Friday for infection prophylasix  Do blood work ANA, DS-DNA, RF, CCP Sed rate, Quantiferon Gold, HgbA1C, ACE, PR-3, MPO, GBM, CBC,BMET  - can do the blood work on day of steroids  Cancel apppointment PFT and visit next week   Followup  - PFT test in 2  weeks - week of thanksgiving  - Dr RaChase Calleror APP in 2 weeks - week of thanksgiving but before thanksgiving  - still seee APP even if PFT unable to be scheduled  - symptom score and walk test on room air needed

## 2021-08-31 NOTE — Progress Notes (Signed)
OV 08/31/2021  Subjective:  Patient ID: Samuel [name], male , DOB: 1955-10-30 , age 65 y.o. , MRN: [id number] , ADDRESS: [place] [address] PCP Antony Contras, MD Patient Care Team: Antony Contras, MD as PCP - General (Family Medicine)  This Provider for this visit: Treatment Team:  Attending Provider: Brand Males, MD  Type of visit:  Video Circumstance: COVID-19 national emergency Identification of patient Samuel [name] with 1956-01-16 and MRN [id number] - 2 person identifier Risks: Risks, benefits, limitations of telephone visit explained. Patient understood and verbalized agreement to proceed Anyone else on call: his wife Patient location: his home video This provider location: [address], [place], [place]   08/31/2021 -video visit to discuss bronchoscopy results from 08/28/2021 and next step in plan    HPI [name] 65 y.o. -his bronchoscopy with lavage shows 40% lymphocytes.  Anything greater than 30% is against UIP.  His RNA genomic classifier is in progress and that would be a specific test although not a ensitive test for UIP.  Bacterial cultures are negative.  He continues have shortness of breath and on room air at rest he is fine but sometimes when he goes to the bathroom he can desaturate into the high 80s.  He is frustrated by his condition.  Our original plan was to ensure no opportunistic or bacterial infections [fully understanding that MTB and fungal infections can take 6 weeks] with the current bronchoscopy and to consider bronchoalveolar lavage.  And then based on this we will schedule pulsed dose steroids.    Recently his G6PD is returned is normal.  He is not on Bactrim for prophylaxis.  Current prednisone Is 76m per day.  He prefers outpatient treatment plan.  We discussed the side effects of high-dose steroids including opportunistic infection, anxiety and irritability, hypertension, diabetes, other lab abnormalities.   Explained the upset benefit of potentially improving upon current ILD active inflammatory phase.  He is willing to go through this treatment.  He prefers outpatient.  He will require lab and vital sign monitoring.   his wife wanted to know if this could be sarcoidosis.  She is read some case reports of sarcoidosis and myeloma associated.  I expressed to them the clinical features do not fit in with sarcoidosis.  However we can check for autoimmune and sarcoid features  Results for TRAUNAK, ANTUNA (MRN [id number] as of 08/31/2021 16:38  Ref. Range 06/18/2021 12:57 08/28/2021 16:51  Monocyte-Macrophage-Serous Fluid Latest Ref Range: 50 - 90 % 5 (L) 10 (L)  Other Cells, Fluid Latest Units: % CORRELATE WITH CYTOLOGY. MESOTHELIAL AND BRONCHIAL LINING CELLS  Color, Fluid Latest Ref Range: YELLOW  PINK (A) COLORLESS (A)  Total Nucleated Cell Count, Fluid Latest Ref Range: 0 - 1,000 cu mm 103 183  Fluid Type-FCT Unknown BRONCHIAL ALVEOLAR LAVAGE BRONCHIAL ALVEOLAR LAVAGE  Lymphs, Fluid Latest Units: % 18 40  Eos, Fluid Latest Units: % 51 0  Appearance, Fluid Latest Ref Range: CLEAR  HAZY (A) CLEAR (A)  Neutrophil Count, Fluid Latest Ref Range: 0 - 25 % 26 (H) 50 (H)    CT Chest data  DG CHEST PORT 1 VIEW  Result Date: 08/28/2021 CLINICAL DATA:  Status post bronchoscopy. EXAM: PORTABLE CHEST 1 VIEW COMPARISON:  Chest radiographs 07/20/2021 and CT 07/30/2021 FINDINGS: The cardiac silhouette is borderline enlarged. Lung volumes are chronically low and slightly lower than on the prior radiographs. The interstitial markings are chronically increased diffusely. No definite  acute airspace consolidation, overt pulmonary edema, sizable pleural effusion, or pneumothorax is identified. Prominent gaseous distension of the stomach is partially visualized. IMPRESSION: Low lung volumes with chronic interstitial changes. Electronically Signed   By: Logan Bores M.D.   On: 08/28/2021 19:10   DG C-ARM  BRONCHOSCOPY  Result Date: 08/28/2021 C-ARM BRONCHOSCOPY: Fluoroscopy was utilized by the requesting physician.  No radiographic interpretation.      PFT  PFT Results Latest Ref Rng & Units 06/15/2021  FVC-Pre L 1.55  FVC-Predicted Pre % 33  FVC-Post L 1.45  FVC-Predicted Post % 31  Pre FEV1/FVC % % 83  Post FEV1/FCV % % 83  FEV1-Pre L 1.28  FEV1-Predicted Pre % 37  FEV1-Post L 1.20  DLCO uncorrected ml/min/mmHg 12.00  DLCO UNC% % 44  DLCO corrected ml/min/mmHg 12.52  DLCO COR %Predicted % 46  DLVA Predicted % 118  TLC L 4.18  TLC % Predicted % 59  RV % Predicted % 95       has a past medical history of Asthma, GERD (gastroesophageal reflux disease), High cholesterol, History of blood transfusion, Hypothyroidism, Interstitial lung disease (HCC), Multiple myeloma (HCC), Perennial allergic rhinitis, Pneumonia, Sciatic pain, right, Seasonal allergic rhinitis, and Thyroid disease.   reports that he quit smoking about 38 years ago. His smoking use included cigarettes. He has a 0.30 pack-year smoking history. He has never used smokeless tobacco.  Past Surgical History:  Procedure Laterality Date   BRONCHIAL BIOPSY  06/18/2021   Procedure: BRONCHIAL BIOPSIES;  Surgeon: Collene Gobble, MD;  Location: [facility];  Service: Cardiopulmonary;;   BRONCHIAL BIOPSY  08/28/2021   Procedure: BRONCHIAL BIOPSIES;  Surgeon: Garner Nash, DO;  Location: [facility];  Service: Cardiopulmonary;;   BRONCHIAL WASHINGS  06/18/2021   Procedure: BRONCHIAL WASHINGS;  Surgeon: Collene Gobble, MD;  Location: [facility];  Service: Cardiopulmonary;;   BRONCHIAL WASHINGS  08/28/2021   Procedure: BRONCHIAL WASHINGS;  Surgeon: Garner Nash, DO;  Location: [facility];  Service: Cardiopulmonary;;   NASAL SINUS SURGERY     NECK SURGERY  1996   VIDEO BRONCHOSCOPY N/A 06/18/2021   Procedure: VIDEO BRONCHOSCOPY WITH FLUORO;  Surgeon: Collene Gobble, MD;  Location: [facility];  Service:  Cardiopulmonary;  Laterality: N/A;   VIDEO BRONCHOSCOPY N/A 08/28/2021   Procedure: VIDEO BRONCHOSCOPY WITH FLUORO;  Surgeon: Garner Nash, DO;  Location: [facility];  Service: Cardiopulmonary;  Laterality: N/A;    Allergies  Allergen Reactions   Clarithromycin Other (See Comments)    Hiccups   Penicillins Other (See Comments)    Child hood    Immunization History  Administered Date(s) Administered   Fluad Quad(high Dose 65+) 08/09/2021   Influenza Split 10/27/2017   Influenza, High Dose Seasonal PF 12/01/2017, 11/29/2019, 12/19/2020   Influenza, Quadrivalent, Recombinant, Inj, Pf 07/28/2019, 08/11/2020   Influenza-Unspecified 11/26/2011, 07/21/2017   PFIZER(Purple Top)SARS-COV-2 Vaccination 12/27/2019, 01/24/2020, 09/21/2020   Tdap 08/20/2005, 09/07/2015   Zoster, Live 11/06/2017, 05/07/2018    Family History  Problem Relation Age of Onset   Hypertension Mother    Allergies Mother    Allergies Father    CAD Father 66   Breast cancer Paternal Grandmother    Hypertension Other    Heart disease Other      Current Outpatient Medications:    sulfamethoxazole-trimethoprim (BACTRIM DS) 800-160 MG tablet, Take 1 tablet by mouth 3 (three) times a week., Disp: 12 tablet, Rfl: 3   acetaminophen (TYLENOL) 500 MG tablet, Take 1,000 mg by mouth  every 6 (six) hours as needed for mild pain, moderate pain or headache., Disp: , Rfl:    acyclovir (ZOVIRAX) 400 MG tablet, Take 1 tablet (400 mg total) by mouth 2 (two) times daily., Disp: 60 tablet, Rfl: 3   albuterol (VENTOLIN HFA) 108 (90 Base) MCG/ACT inhaler, Inhale 2 puffs into the lungs every 6 hours as needed for wheezing or shortness of breath., Disp: 8.5 g, Rfl: 1   allopurinol (ZYLOPRIM) 300 MG tablet, Take 300 mg by mouth daily., Disp: , Rfl:    ALPRAZolam (XANAX) 1 MG tablet, Take 0.33 mg by mouth at bedtime., Disp: , Rfl:    aspirin EC 81 MG tablet, Take 81 mg by mouth daily. Swallow whole., Disp: , Rfl:    ASSESS FULL RANGE  PEAK METER DEVI, as directed., Disp: , Rfl:    cholecalciferol (VITAMIN D) 1000 UNITS tablet, Take 3,000 Units by mouth daily., Disp: , Rfl:    EPINEPHrine (EPI-PEN) 0.3 mg/0.3 mL DEVI, Inject 0.3 mg into the muscle as needed., Disp: , Rfl:    ezetimibe-simvastatin (VYTORIN) 10-40 MG per tablet, Take 1 tablet by mouth at bedtime., Disp: , Rfl:    fluticasone-salmeterol (ADVAIR HFA) 230-21 MCG/ACT inhaler, Inhale 2 puffs into the lungs 2 (two) times daily., Disp: 1 each, Rfl: 12   furosemide (LASIX) 20 MG tablet, Take 1 tablet (20 mg total) by mouth as needed for fluid or edema., Disp: 14 tablet, Rfl: 1   lenalidomide ([name]) 10 MG capsule, TAKE 1 CAPSULE DAILY FOR 14 DAYS, NONE FOR 7 DAYS, Disp: 14 capsule, Rfl: 0   levothyroxine (SYNTHROID) 150 MCG tablet, Take 150 mcg by mouth every other day. Alternate, Disp: , Rfl:    levothyroxine (SYNTHROID) 175 MCG tablet, Take 1 tablet by mouth every other day., Disp: , Rfl:    montelukast (SINGULAIR) 10 MG tablet, Take 10 mg by mouth at bedtime., Disp: , Rfl:    Multiple Vitamin (MULTIVITAMIN) tablet, Take 1 tablet by mouth daily., Disp: , Rfl:    omeprazole (PRILOSEC) 40 MG capsule, Take 1 capsule by mouth daily., Disp: , Rfl:    ondansetron (ZOFRAN) 8 MG tablet, Take 1 tablet (8 mg total) by mouth every 8 (eight) hours as needed for nausea or vomiting., Disp: 30 tablet, Rfl: 0   OXYGEN, Inhale 2 L into the lungs continuous., Disp: , Rfl:    predniSONE (DELTASONE) 10 MG tablet, Take 5 tablets (50 mg total) by mouth daily with breakfast for 14 days, THEN 4 tablets (40 mg total) daily with breakfast for 14 days, THEN 3 tablets (30 mg total) daily with breakfast for 14 days, THEN 2 tablets (20 mg total) daily with breakfast for 14 days. Once you reach 34m, remain on 251muntil further notice.., Disp: 196 tablet, Rfl: 0   prochlorperazine (COMPAZINE) 10 MG tablet, Take 1 tablet (10 mg total) by mouth every 6 (six) hours as needed for nausea or vomiting.,  Disp: 30 tablet, Rfl: 0   sildenafil (REVATIO) 20 MG tablet, Take 20 mg by mouth daily as needed (ED)., Disp: , Rfl:    sodium chloride (OCEAN) 0.65 % SOLN nasal spray, Place 1 spray into both nostrils as needed for congestion., Disp: , Rfl:    tamsulosin (FLOMAX) 0.4 MG CAPS capsule, Take 0.4 mg by mouth daily., Disp: , Rfl:    triamcinolone cream (KENALOG) 0.1 %, Apply 1 application topically daily as needed (sun burn itch)., Disp: , Rfl:       Objective:   There  were no vitals filed for this visit.  Estimated body mass index is 30.25 kg/m as calculated from the following:   Height as of 08/28/21: _0  (1.778 m).   Weight as of an earlier encounter on 08/31/21: 210 lb 12.8 oz (95.6 kg).  _1 @  There were no vitals filed for this visit.   Physical Exam He was wearing his oxygen on.  Looks stable     Assessment:       ICD-10-CM   1. ILD (interstitial lung disease) (HCC)  J84.9 ANA    Anti-DNA antibody, double-stranded    Rheumatoid factor    Cyclic citrul peptide antibody, IgG    Sedimentation rate    QuantiFERON-TB Gold Plus    HgB A1c    Angiotensin converting enzyme    Mpo/pr-3 (anca) antibodies    Glomerular Membrane Antibodies    CBC with Differential/Platelet    Basic metabolic panel    2. Drug-induced pneumonitis  J18.9 ANA   T50.905A Anti-DNA antibody, double-stranded    Rheumatoid factor    Cyclic citrul peptide antibody, IgG    Sedimentation rate    QuantiFERON-TB Gold Plus    HgB A1c    Angiotensin converting enzyme    Mpo/pr-3 (anca) antibodies    Glomerular Membrane Antibodies    CBC with Differential/Platelet    Basic metabolic panel    3. Current chronic use of systemic steroids  Z79.52 ANA    Anti-DNA antibody, double-stranded    Rheumatoid factor    Cyclic citrul peptide antibody, IgG    Sedimentation rate    QuantiFERON-TB Gold Plus    HgB A1c    Angiotensin converting enzyme    Mpo/pr-3 (anca) antibodies    Glomerular  Membrane Antibodies    CBC with Differential/Platelet    Basic metabolic panel    4. History of multiple myeloma  Z85.79 ANA    Anti-DNA antibody, double-stranded    Rheumatoid factor    Cyclic citrul peptide antibody, IgG    Sedimentation rate    QuantiFERON-TB Gold Plus    HgB A1c    Angiotensin converting enzyme    Mpo/pr-3 (anca) antibodies    Glomerular Membrane Antibodies    CBC with Differential/Platelet    Basic metabolic panel         Plan:     Patient Instructions     ICD-10-CM   1. ILD (interstitial lung disease) ([place])  J84.9     2. 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 Current chronic use of systemic steroids  Z79.52     4. History of multiple myeloma  Z85.79      Stable overall without changed NO bacterial infectio bronch 08/28/21  plan Outpatient pulse steroids Solumedrol  528m IV Twice daily x 3 days  - aim for Tuesday 11/15, 11/16 and 09/06/21 Continue prednisone 284m but hold during days of IV steroids Start bactrim 1 DS - Monday, Wed, Friday for infection prophylasix  Do blood work ANA, DS-DNA, RF, CCP Sed rate, Quantiferon Gold, HgbA1C, ACE, PR-3, MPO, GBM, CBC,BMET  - can do the blood work on day of steroids  Cancel apppointment PFT and visit next week   Followup  - PFT test in 2  weeks - week of thanksgiving  - Dr RaChase Calleror APP in 2 weeks - week of thanksgiving but before thanksgiving  - still seee APP even if PFT unable to be scheduled  - symptom score and walk  test on room air needed   ( Level 05 visit: Estb 40-54 min    in  visit type: on-site physical face to visit  in total care time and counseling or/and coordination of care by this undersigned MD - Dr Brand Males. This includes one or more of the following on this same day 08/31/2021: pre-charting, chart review, note writing, documentation discussion of test results, diagnostic or treatment recommendations, prognosis, risks and benefits of management options,  instructions, education, compliance or risk-factor reduction. It excludes time spent by the [facility] or office staff in the care of the patient. Actual time 45 min)   SIGNATURE    Dr. Brand Males, M.D., F.C.C.P,  Pulmonary and Critical Care Medicine Staff Physician, [place] Director - Interstitial Lung Disease  Program  Pulmonary [place] at [place], [place], [address]  Pager: [phone], If no answer or between  15:00h - 7:00h: call [phone] Telephone: [phone]  6:59 PM 08/31/2021

## 2021-09-01 ENCOUNTER — Encounter (HOSPITAL_COMMUNITY): Payer: Self-pay | Admitting: Pulmonary Disease

## 2021-09-01 NOTE — Anesthesia Postprocedure Evaluation (Signed)
Anesthesia Post Note  Patient: Samuel [name]  Procedure(s) Performed: VIDEO BRONCHOSCOPY WITH FLUORO BRONCHIAL WASHINGS BRONCHIAL BIOPSIES     Patient location during evaluation: PACU Anesthesia Type: General Level of consciousness: awake and alert Pain management: pain level controlled Vital Signs Assessment: post-procedure vital signs reviewed and stable Respiratory status: spontaneous breathing, nonlabored ventilation, respiratory function stable and patient connected to nasal cannula oxygen Cardiovascular status: blood pressure returned to baseline and stable Postop Assessment: no apparent nausea or vomiting Anesthetic complications: no   No notable events documented.  Last Vitals:  Vitals:   08/28/21 1818 08/28/21 1830  BP: (!) 171/101 (!) 163/103  Pulse: 71 70  Resp: (!) 22 (!) 31  Temp:    SpO2: 100% 100%    Last Pain:  Vitals:   08/28/21 1815  PainSc: 0-No pain                 [name]

## 2021-09-02 LAB — AEROBIC/ANAEROBIC CULTURE W GRAM STAIN (SURGICAL/DEEP WOUND): Culture: NO GROWTH

## 2021-09-03 ENCOUNTER — Other Ambulatory Visit: Payer: Self-pay | Admitting: Pharmacy Technician

## 2021-09-03 ENCOUNTER — Telehealth: Payer: Self-pay | Admitting: Pharmacy Technician

## 2021-09-03 LAB — KAPPA/LAMBDA LIGHT CHAINS
Kappa free light chain: 22.8 mg/L — ABNORMAL HIGH (ref 3.3–19.4)
Kappa, lambda light chain ratio: 2.11 — ABNORMAL HIGH (ref 0.26–1.65)
Lambda free light chains: 10.8 mg/L (ref 5.7–26.3)

## 2021-09-03 NOTE — Telephone Encounter (Signed)
Dr. [name],  Juluis Rainier note:  Auth Submission: NO AUTH NEEDED Payer: [employer] Medication & CPT/J Code(s) submitted: Solumedrol (Methylprednisolone) J2930 Route of submission (phone, fax, portal): PHONE: [phone] Auth type: Buy/Bill Units/visits requested: 6 Reference number: Christine-A.  Patient will be scheduled as soon as possible.

## 2021-09-04 ENCOUNTER — Other Ambulatory Visit: Payer: Self-pay

## 2021-09-04 ENCOUNTER — Ambulatory Visit (INDEPENDENT_AMBULATORY_CARE_PROVIDER_SITE_OTHER): Payer: Managed Care, Other (non HMO)

## 2021-09-04 VITALS — BP 176/100 | HR 66 | Temp 97.6°F | Resp 20 | Ht 70.0 in | Wt 214.0 lb

## 2021-09-04 VITALS — BP 149/92 | HR 79 | Temp 97.5°F | Resp 20

## 2021-09-04 DIAGNOSIS — J849 Interstitial pulmonary disease, unspecified: Secondary | ICD-10-CM

## 2021-09-04 LAB — MULTIPLE MYELOMA PANEL, SERUM
Albumin SerPl Elph-Mcnc: 3.4 g/dL (ref 2.9–4.4)
Albumin/Glob SerPl: 1.1 (ref 0.7–1.7)
Alpha 1: 0.2 g/dL (ref 0.0–0.4)
Alpha2 Glob SerPl Elph-Mcnc: 0.8 g/dL (ref 0.4–1.0)
B-Globulin SerPl Elph-Mcnc: 0.9 g/dL (ref 0.7–1.3)
Gamma Glob SerPl Elph-Mcnc: 1.1 g/dL (ref 0.4–1.8)
Globulin, Total: 3.1 g/dL (ref 2.2–3.9)
IgA: 269 mg/dL (ref 61–437)
IgG (Immunoglobin G), Serum: 1238 mg/dL (ref 603–1613)
IgM (Immunoglobulin M), Srm: 88 mg/dL (ref 20–172)
Total Protein ELP: 6.5 g/dL (ref 6.0–8.5)

## 2021-09-04 MED ORDER — EPINEPHRINE 0.3 MG/0.3ML IJ SOAJ: 0.3000 mg | Freq: Once | INTRAMUSCULAR | Status: DC | PRN

## 2021-09-04 MED ORDER — DIPHENHYDRAMINE HCL 50 MG/ML IJ SOLN: 50.0000 mg | Freq: Once | INTRAMUSCULAR | Status: DC | PRN

## 2021-09-04 MED ORDER — METHYLPREDNISOLONE SODIUM SUCC 125 MG IJ SOLR: 125.0000 mg | Freq: Once | INTRAMUSCULAR | Status: DC | PRN

## 2021-09-04 MED ORDER — FAMOTIDINE IN NACL 20-0.9 MG/50ML-% IV SOLN: 20.0000 mg | Freq: Once | INTRAVENOUS | Status: DC | PRN

## 2021-09-04 MED ORDER — SODIUM CHLORIDE 0.9 % IV SOLN
500.0000 mg | Freq: Once | INTRAVENOUS | Status: AC
  Administered 2021-09-04: 500 mg via INTRAVENOUS
  Filled 2021-09-04: qty 8

## 2021-09-04 MED ORDER — ALBUTEROL SULFATE HFA 108 (90 BASE) MCG/ACT IN AERS: 2.0000 | INHALATION_SPRAY | Freq: Once | RESPIRATORY_TRACT | Status: DC | PRN

## 2021-09-04 MED ORDER — SODIUM CHLORIDE 0.9 % IV SOLN: Freq: Once | INTRAVENOUS | Status: DC | PRN

## 2021-09-04 MED ORDER — SODIUM CHLORIDE 0.9 % IV SOLN
500.0000 mg | Freq: Two times a day (BID) | INTRAVENOUS | Status: DC
  Administered 2021-09-04: 500 mg via INTRAVENOUS
  Filled 2021-09-04 (×2): qty 8

## 2021-09-04 NOTE — Progress Notes (Signed)
Diagnosis: Interstial Pulmonary Disease  Provider:  Marshell Garfinkel, MD  Procedure: Infusion  IV Type: Peripheral, IV Location: R Forearm  Solumedrol (Methylprednisolone), Dose: 500 mg  Infusion Start Time: 15.13 09/04/2021  Infusion Stop Time: 16.16 09/04/2021  Post Infusion IV Care:  I/V site flushed with 10 ml NS.  I/V site left in place per order for next infusion on 09/05/2021.  Discharge: Condition: Good, Destination: Home . AVS provided to patient.   Performed by:  Arnoldo Morale, RN

## 2021-09-04 NOTE — Progress Notes (Signed)
Diagnosis: Interstitial pulmonary disease   Provider:  Marshell Garfinkel, MD  Procedure: Infusion  IV Type: Peripheral, IV Location: R Forearm  Solumedrol (Methylprednisolone), Dose: 500 mg  Infusion Start Time: 0928  Infusion Stop Time: 10.32  Post Infusion IV Care: Peripheral IV in place for further infusion per MD's order. Bp in higher side pt states its been in the higher side and his primary is aware of it. Pt instructed to consult further.verbalizes understanding .  Discharge: Condition: Good, Destination: Home . AVS provided to patient.   Performed by:  Arnoldo Morale, RN

## 2021-09-05 ENCOUNTER — Ambulatory Visit (INDEPENDENT_AMBULATORY_CARE_PROVIDER_SITE_OTHER): Payer: Managed Care, Other (non HMO) | Admitting: *Deleted

## 2021-09-05 ENCOUNTER — Encounter: Payer: Self-pay | Admitting: Internal Medicine

## 2021-09-05 ENCOUNTER — Ambulatory Visit: Payer: Managed Care, Other (non HMO) | Admitting: Internal Medicine

## 2021-09-05 ENCOUNTER — Encounter: Payer: Self-pay | Admitting: Hematology and Oncology

## 2021-09-05 ENCOUNTER — Ambulatory Visit (INDEPENDENT_AMBULATORY_CARE_PROVIDER_SITE_OTHER): Payer: Managed Care, Other (non HMO)

## 2021-09-05 ENCOUNTER — Telehealth: Payer: Self-pay | Admitting: Internal Medicine

## 2021-09-05 ENCOUNTER — Other Ambulatory Visit: Payer: Self-pay

## 2021-09-05 VITALS — BP 165/93 | HR 69 | Temp 97.9°F | Resp 20 | Ht 70.0 in | Wt 214.0 lb

## 2021-09-05 VITALS — BP 184/108 | HR 71 | Temp 97.6°F | Resp 20 | Ht 70.0 in | Wt 214.8 lb

## 2021-09-05 DIAGNOSIS — J849 Interstitial pulmonary disease, unspecified: Secondary | ICD-10-CM

## 2021-09-05 MED ORDER — FAMOTIDINE IN NACL 20-0.9 MG/50ML-% IV SOLN: 20.0000 mg | Freq: Once | INTRAVENOUS | Status: DC | PRN

## 2021-09-05 MED ORDER — EPINEPHRINE 0.3 MG/0.3ML IJ SOAJ: 0.3000 mg | Freq: Once | INTRAMUSCULAR | Status: DC | PRN

## 2021-09-05 MED ORDER — BISOPROLOL FUMARATE 5 MG PO TABS: 2.5000 mg | ORAL_TABLET | Freq: Every day | ORAL | 0 refills | Status: DC

## 2021-09-05 MED ORDER — SODIUM CHLORIDE 0.9 % IV SOLN: Freq: Once | INTRAVENOUS | Status: DC | PRN

## 2021-09-05 MED ORDER — DIPHENHYDRAMINE HCL 50 MG/ML IJ SOLN: 50.0000 mg | Freq: Once | INTRAMUSCULAR | Status: DC | PRN

## 2021-09-05 MED ORDER — METHYLPREDNISOLONE SODIUM SUCC 125 MG IJ SOLR: 125.0000 mg | Freq: Once | INTRAMUSCULAR | Status: DC | PRN

## 2021-09-05 MED ORDER — ALBUTEROL SULFATE HFA 108 (90 BASE) MCG/ACT IN AERS: 2.0000 | INHALATION_SPRAY | Freq: Once | RESPIRATORY_TRACT | Status: DC | PRN

## 2021-09-05 MED ORDER — SODIUM CHLORIDE 0.9 % IV SOLN
500.0000 mg | Freq: Once | INTRAVENOUS | Status: AC
  Administered 2021-09-05: 500 mg via INTRAVENOUS
  Filled 2021-09-05: qty 8

## 2021-09-05 MED ORDER — SODIUM CHLORIDE 0.9 % IV SOLN
500.0000 mg | Freq: Once | INTRAVENOUS | Status: DC
  Filled 2021-09-05: qty 8

## 2021-09-05 NOTE — Progress Notes (Signed)
Diagnosis: intestinal Pulmonary Disease   Provider:  Marshell Garfinkel, MD  Procedure: Infusion  IV Type: Peripheral, IV Location: R Forearm  Solumedrol (Methylprednisolone), Dose: 500 mg  Infusion Start Time: 0923  Infusion Stop Time: 1025  Post Infusion IV Care:  iv left in place. Saline lock   Discharge: Condition: Good, Destination: Home . AVS provided to patient.   Performed by:  Amelie Caracci, Sherlon Handing, LPN

## 2021-09-05 NOTE — Telephone Encounter (Signed)
BP 168/101 at infusion center. Feels stable. This bP is improved BP after arrival. Says bP running higher side all year  Plan  -start bisoprolol 2.5mg  per day 09/05/2021 - he will have bp recheck tomorrow at infusion -ok for infusion 09/05/2021 - advise him to go to ER if worse or symtoins overniguht  Thanks    SIGNATURE    Dr. Brand Males, M.D., F.C.C.P,  Pulmonary and Critical Care Medicine Staff Physician, [place] Director - Interstitial Lung Disease  Program  Pulmonary [place] at [place], [place], [address]  NPI Number:  NPI #[id number]  Pager: [phone], If no answer  -> Check AMION or Try [phone] Telephone (clinical office): [phone] Telephone (research): [phone]  3:38 PM 09/05/2021

## 2021-09-05 NOTE — Progress Notes (Signed)
Diagnosis: interstitial pulmonary  Provider:  Marshell Garfinkel, MD  Procedure: Infusion  IV Type: Peripheral, IV Location: R Forearm  Solumedrol (Methylprednisolone), Dose: 500 mg  Infusion Start Time: 1514 pm  Infusion Stop Time: 1612 pm  Post Infusion IV Care: Observation period completed and Peripheral IV Discontinued  Discharge: Condition: Good, Destination: Home . AVS provided to patient.   Performed by:  Oren Beckmann, RN

## 2021-09-05 NOTE — Telephone Encounter (Signed)
Called and spoke with patient. He would like to use [employer]. RX has been sent. Nothing further needed.

## 2021-09-06 ENCOUNTER — Other Ambulatory Visit: Payer: Self-pay

## 2021-09-06 ENCOUNTER — Ambulatory Visit: Payer: Managed Care, Other (non HMO)

## 2021-09-06 ENCOUNTER — Ambulatory Visit (INDEPENDENT_AMBULATORY_CARE_PROVIDER_SITE_OTHER): Payer: Managed Care, Other (non HMO) | Admitting: *Deleted

## 2021-09-06 ENCOUNTER — Telehealth: Payer: Self-pay | Admitting: *Deleted

## 2021-09-06 VITALS — BP 180/103 | HR 71 | Temp 98.0°F | Resp 16 | Ht 70.0 in | Wt 218.8 lb

## 2021-09-06 VITALS — BP 156/84 | HR 73 | Temp 97.9°F | Resp 16

## 2021-09-06 DIAGNOSIS — J849 Interstitial pulmonary disease, unspecified: Secondary | ICD-10-CM

## 2021-09-06 MED ORDER — EPINEPHRINE 0.3 MG/0.3ML IJ SOAJ: 0.3000 mg | Freq: Once | INTRAMUSCULAR | Status: DC | PRN

## 2021-09-06 MED ORDER — METHYLPREDNISOLONE SODIUM SUCC 125 MG IJ SOLR: 125.0000 mg | Freq: Once | INTRAMUSCULAR | Status: DC | PRN

## 2021-09-06 MED ORDER — SODIUM CHLORIDE 0.9 % IV SOLN: Freq: Once | INTRAVENOUS | Status: DC | PRN

## 2021-09-06 MED ORDER — FAMOTIDINE IN NACL 20-0.9 MG/50ML-% IV SOLN: 20.0000 mg | Freq: Once | INTRAVENOUS | Status: DC | PRN

## 2021-09-06 MED ORDER — SODIUM CHLORIDE 0.9 % IV SOLN
500.0000 mg | Freq: Once | INTRAVENOUS | Status: AC
  Administered 2021-09-06: 15:00:00 500 mg via INTRAVENOUS
  Filled 2021-09-06: qty 8

## 2021-09-06 MED ORDER — SODIUM CHLORIDE 0.9 % IV SOLN
500.0000 mg | Freq: Once | INTRAVENOUS | Status: AC
  Administered 2021-09-06: 09:00:00 500 mg via INTRAVENOUS
  Filled 2021-09-06: qty 8

## 2021-09-06 MED ORDER — DIPHENHYDRAMINE HCL 50 MG/ML IJ SOLN: 50.0000 mg | Freq: Once | INTRAMUSCULAR | Status: DC | PRN

## 2021-09-06 MED ORDER — ALBUTEROL SULFATE HFA 108 (90 BASE) MCG/ACT IN AERS: 2.0000 | INHALATION_SPRAY | Freq: Once | RESPIRATORY_TRACT | Status: DC | PRN

## 2021-09-06 NOTE — Progress Notes (Signed)
Diagnosis: Interstitial Pulmonary Disease  Provider:  Marshell Garfinkel, MD  Procedure: Infusion  IV Type: Peripheral, IV Location: R Forearm  Solumedrol (Methylprednisolone), Dose: 500 mg  Infusion Start Time: 1500  Infusion Stop Time: 1600  Post Infusion IV Care: Peripheral IV Discontinued  Discharge: Condition: Good, Destination: Home . AVS provided to patient.   Performed by:  Paul Dykes, RN

## 2021-09-06 NOTE — Telephone Encounter (Signed)
[name] patient regarding recent lab results. Spoke with pt. Informed him that his M Proteins remain undetectable at this time. He is very pleased with this. He is actually getting the high dose steroids even as we spoke today and states he feels better. He states he will call me next week. For an update from Dr. Alphonsus Sias.

## 2021-09-06 NOTE — Telephone Encounter (Signed)
-----   Message from Orson Slick, MD sent at 09/06/2021  2:56 PM EST ----- Please let Samuel [name] know that his M protein remains undetectable. We will continue to hold until his lung issues improve.   ----- Message ----- From: Buel Ream, Lab [facility] [place] Sent: 08/31/2021  10:47 AM EST To: Orson Slick, MD

## 2021-09-06 NOTE — Progress Notes (Signed)
Diagnosis: interstial pulmonary  Provider:  Marshell Garfinkel, MD  Procedure: Infusion  IV Type: Peripheral, IV Location: R Forearm  Solumedrol (Methylprednisolone), 500mg   Infusion Start Time: 0920 am  Infusion Stop Time: 1025 am  Post Infusion IV Care: Observation period completed and Peripheral IV Discontinued  Discharge: Condition: Good, Destination: Home . AVS provided to patient.   Performed by:  Oren Beckmann, RN

## 2021-09-06 NOTE — Progress Notes (Signed)
3:13pm Patient has been resting x 15 minutes on room air Pulse ox is now 97% HR 65.  3:18pm After ambulating patient x 5 minutes on room air pulse ox is now 93% HR 87.   Reported Findings to Dr. [name].

## 2021-09-07 ENCOUNTER — Ambulatory Visit: Payer: Managed Care, Other (non HMO)

## 2021-09-07 ENCOUNTER — Other Ambulatory Visit: Payer: Managed Care, Other (non HMO)

## 2021-09-07 ENCOUNTER — Telehealth: Payer: Self-pay | Admitting: Internal Medicine

## 2021-09-07 NOTE — Telephone Encounter (Signed)
Prednisone history: 03/05/21 - dexamethasone 69m tabs - 40 tabs for 28 day supply - Dr. JNarda Rutherford- 470monce weekly (for multiple myeloma chemotherapy N/V ppx)   04/09/21 - dexamethasone 46m246mabs - 40 tabs for 28 day supply - Dr. JohNarda Rutherford62m35mce weekly  (for multiple myeloma chemotherapy N/V ppx)   05/07/21 - dexamethasone 46mg 20ms - 40 tabs for 28 day supply - Dr. John Narda Rutherfordmg 43m weekly  (for multiple myeloma chemotherapy N/V ppx)   06/06/21 - dexamethasone 46mg ta70m- 40 tabs for 28 day supply - Dr. John DoNarda Rutherfordeased to 20mg on20meekly  06/18/21 - prednisone 10mg tab33m104 tabs for 34 day supply - Dr. Jennifer Dessa Phimg dail37m7 days, 40 mg daily x 7 days, 30 mg daily x 7 days, 20mg daily50m days, 10mg daily 59mdays, 5mg daily x 57mays   07/13/21 - prednisone 10mg tabs - 268mab for 30 day supply - Dr. Ramaswamy ----Chase Callerily x 1346mys, 40 mg daily x 14 days, 30mg daily x 1435ms, 20mg daily there21mr

## 2021-09-07 NOTE — Telephone Encounter (Signed)
Devki: need a huge favor please. Could you please give me a run down of his steroid dose and tapering history since Aug 2022 . HE finished 3 days of pulse steroids yesterday. Back on 20mg  per day 09/07/2021 but I might need to do a different taper  Thanks    SIGNATURE    Dr. Brand Males, M.D., F.C.C.P,  Pulmonary and Critical Care Medicine Staff Physician, [place] Director - Interstitial Lung Disease  Program  Pulmonary [place] at [place], [place], [address]  NPI Number:  NPI #[id number]  Pager: [phone], If no answer  -> Check AMION or Try [phone] Telephone (clinical office): [phone] Telephone (research): [phone]  9:02 AM 09/07/2021

## 2021-09-11 NOTE — Telephone Encounter (Signed)
Pt had bronch performed 11/8. Closing encounter.

## 2021-09-14 ENCOUNTER — Other Ambulatory Visit: Payer: Managed Care, Other (non HMO)

## 2021-09-14 ENCOUNTER — Ambulatory Visit: Payer: Managed Care, Other (non HMO) | Admitting: Hematology and Oncology

## 2021-09-14 ENCOUNTER — Ambulatory Visit: Payer: Managed Care, Other (non HMO)

## 2021-09-14 ENCOUNTER — Ambulatory Visit: Payer: Managed Care, Other (non HMO) | Admitting: Physician Assistant

## 2021-09-17 ENCOUNTER — Telehealth: Payer: Self-pay | Admitting: Internal Medicine

## 2021-09-17 NOTE — Telephone Encounter (Signed)
Patient is returning phone call. Patient phone number is [phone]. 

## 2021-09-17 NOTE — Telephone Encounter (Signed)
Called and spoke with patient. He wants to know if he needs to continue the 20mg  of prednisone once a day. He will be out after tomorrow.   He also mentioned on the phone that [employer] has declined to pay for half of the bronch procedure. He read the letter that [facility] mailed to him. Basically [employer] has denied coverage because upon their medical director's investigation, the bronch was deemed not medically necessary as it was considered to be an "investigational procedure". The letter did not contain any contact information for an appeal so he is not sure if this the final decision.   MR, can you please advise? Thanks.

## 2021-09-17 NOTE — Telephone Encounter (Signed)
Yes, stay on 20mg  prednisone. See if you can add him at 4.15pm or 4.30pm face to face. Like to do a walk test and physical exam. If Samuel [name] or Samuel [name] can do spirometry before that 09/18/21 - wil lbe great  Re bronch - this is the first denial I have seen . This is odd. Yes, need to counter. He should bring that letter with him 09/18/21

## 2021-09-17 NOTE — Telephone Encounter (Signed)
Called pt and there was no answer-LMTCB °

## 2021-09-18 MED ORDER — BISOPROLOL FUMARATE 5 MG PO TABS
2.5000 mg | ORAL_TABLET | Freq: Every day | ORAL | 0 refills | Status: DC
Start: 2021-09-18 — End: 2021-09-25

## 2021-09-18 MED ORDER — PREDNISONE 10 MG PO TABS: 20.0000 mg | ORAL_TABLET | Freq: Every day | ORAL | 0 refills | Status: DC

## 2021-09-18 NOTE — Telephone Encounter (Signed)
Called and spoke with patient. He has been scheduled for 12/06 at 430pm. While on the phone, he asked for a refill of the bisoprolol since it has helped his blood pressure. Advised him that I would go ahead and send a refill on the prednisone as well.   Nothing further needed at time of call.

## 2021-09-18 NOTE — Telephone Encounter (Signed)
Patient is returning phone call. Patient not able to come in for appointment today. Would like to be called at 3:00 pm today. Patient phone number is [phone].

## 2021-09-18 NOTE — Telephone Encounter (Signed)
ATC LVMTCB x1. Front staff if pt calls back could you please see if pt can come in for an OV with Dr. [name] today at 4:15 or 4:30 and bring in insurance letter? Thank you.

## 2021-09-18 NOTE — Telephone Encounter (Signed)
Kidney, September 25, 2021 Tuesday in the afternoon last slot?  Or Friday, 09/28/2021 last slot in the morning.  If this will not work he should see nurse practitioner this week or next week in a 30-minute slot.  He needs this simple walking desaturation test on room air.  He needs a symptom assessment.  We also need to look at the insurance denial letter on the bronchoscopy

## 2021-09-18 NOTE — Telephone Encounter (Signed)
Thanks and noted. Will close message

## 2021-09-20 ENCOUNTER — Telehealth: Payer: Self-pay | Admitting: Hematology and Oncology

## 2021-09-20 NOTE — Telephone Encounter (Signed)
Sch per 11/16 inbasket, left msg.

## 2021-09-21 ENCOUNTER — Other Ambulatory Visit: Payer: Managed Care, Other (non HMO)

## 2021-09-21 ENCOUNTER — Ambulatory Visit: Payer: Managed Care, Other (non HMO)

## 2021-09-25 ENCOUNTER — Telehealth: Payer: Self-pay | Admitting: Internal Medicine

## 2021-09-25 ENCOUNTER — Encounter: Payer: Self-pay | Admitting: Internal Medicine

## 2021-09-25 ENCOUNTER — Other Ambulatory Visit: Payer: Self-pay

## 2021-09-25 ENCOUNTER — Ambulatory Visit (INDEPENDENT_AMBULATORY_CARE_PROVIDER_SITE_OTHER): Payer: Managed Care, Other (non HMO) | Admitting: Internal Medicine

## 2021-09-25 VITALS — BP 130/74 | HR 75 | Temp 98.0°F | Ht 70.0 in | Wt 216.2 lb

## 2021-09-25 DIAGNOSIS — Z7952 Long term (current) use of systemic steroids: Secondary | ICD-10-CM

## 2021-09-25 DIAGNOSIS — Z5989 Other problems related to housing and economic circumstances: Secondary | ICD-10-CM | POA: Diagnosis not present

## 2021-09-25 DIAGNOSIS — J849 Interstitial pulmonary disease, unspecified: Secondary | ICD-10-CM | POA: Diagnosis not present

## 2021-09-25 DIAGNOSIS — I10 Essential (primary) hypertension: Secondary | ICD-10-CM

## 2021-09-25 MED ORDER — BISOPROLOL FUMARATE 5 MG PO TABS: 2.5000 mg | ORAL_TABLET | Freq: Every day | ORAL | 0 refills | Status: DC

## 2021-09-25 MED ORDER — SULFAMETHOXAZOLE-TRIMETHOPRIM 800-160 MG PO TABS: 1.0000 | ORAL_TABLET | ORAL | 3 refills | Status: DC

## 2021-09-25 NOTE — Progress Notes (Signed)
IOV 10/04/2011  65 year old male. Allergies and possible asthma. Hypothyroidism. Anxiety state but no formal diagnosis. Has history of claustrophobia as well Non-smoker. Music therapist. Dad with CAD - s/p CABG at age 42 (now 29y)  Referred by Dr Donneta Romberg. Reports dyspnea. INsidious onset "all my life". Increased after starting allergy shots in June 2012. Says during hikes after he gets going it gets better. Same in gym. Notices more when he is starting out or stationary at rest. HE is not sure what it is.  Feels he needs to take a deep breath on occasion. So, now referred to cardiology and pulmonary. Says ICS Qvar in august/sept 2012 made it worse. Outside chart mentions asthma but patient says not sure if he has asthma. But reports childhood hx of asthma diagnosed by a pediatrician at age 84. Says he had similar symptoms.  Up until 1990 used to 10K but even back then had similar symptoms and would need to warm up before feeling better. Then stopped running due to neck problems. Similar during swimming exercise in 1996-1997. Quit swimming 1999. Episodes associated with wheezing but no cough. Unclear if albuterol helps but on xopenex prn esp before walking. This whole thing feels like he is not getting "enough juice". Never had PFT but recollects normal spirometry at Dr Rush [place] office.  He has seen Dr Gwendel Hanson cardiology for above - treadmill and echo and carotid doppler all pending. Current pulmonary modulating drug is Singulair only  Chest x-ray 07/16/2011 is clear per my personal review.  xxxxxxxxxxxxxxxxxxxxxxxxxxxxxxxxxxxxxxxxxxxxxxxxxxxxxxxxxxxxxxxxxx  Inpatient consult 06/16/21 66 y/o male with a history of multiple myeloma diagnosed in 2022, currently undergoing treatment presented to the [facility] emergency department from his hematology oncology appointment with a chief complaint of dyspnea.  The patient smoked cigarettes briefly as a young adult but since then has lived a relatively  healthy lifestyle staying active with exercise and playing golf.  He was diagnosed with multiple myeloma in the spring 2022 when a bruise was discovered while he was receiving a sports massage.  He went for further work-up and was found to have abnormal lab work, this led to a bone marrow biopsy confirming diagnosis of multiple myeloma.  He has been followed by Dr. [name] who has been treating him with VRd chemotherapy since Mar 16, 2021.  He is currently in the middle of his fifth cycle.     Over the last several weeks he has been developing progressive shortness of breath with exertion.  He says this has been going on for about 3 to 4 weeks and has significantly worsened in the last week.  This is associated with a dry, rare cough.  He denies fevers, chills, leg swelling, weight gain, or mucus production.  In the last several days (just prior to admission) [place] and while there felt significant shortness of breath so he presented to Dr. Libby Maw office for further evaluation.  He was sent to the emergency room for evaluation further because of the severity of his shortness of breath.  In the emergency department he was noted to have hypoxemia to an O2 saturation of 87% and an abnormal chest exam.  Pulmonary and critical care medicine was consulted for further evaluation.  He says that throughout his adult life he has had what he calls "huffing" when he talks while exerting himself or with exertion.  He says he has been followed by an allergist and has been treated for asthma at  times over the years.  He does not regularly use an inhaler and he says this is not something that he routinely needs.  He was seen by my partner Dr. [name] at 1 point in 2013 during which time he had a normal chest x-ray and normal lung function testing.   He had COVID in April 2022.   June 15, 2021 admission August 26 CT chest images independently reviewed: No pulmonary embolism, diffuse interlobular septal  thickening, pleural nodularity noted, areas of groundglass opacification with some patchy distribution particularly in the lung base, no significant pleural effusion, mild reactive appearing lymphadenopathy in the mediastinum Acute respiratory failure with hypoxemia in the setting of diffuse parenchymal lung disease of undetermined etiology: This is somewhat of a complex case given his underlying asthma and the fact that he had COVID in 2022 and in April 2022 a CT scan of his abdomen showed some ill-defined interstitial changes in the periphery of his lungs on lung windows in the bases.  Based on the time course of his illness and reports in the literature I am most concerned about [name] lung toxicity (see citation below), though it is also important to evaluate for another underlying and progressive primary pulmonary ILD or less likely an opportunistic infection (history doesn't seem to support this).  [name] can cause eosinophilic pneumonia which we can assess for with a bronchoscopy, but his imaging characteristics aren't typical for this.  It is possible that the non-specific interstitial changes seen on his lungs in April 2022 were related to his recent COVID infection.  I don't think his baseline asthma explains his symptoms, though he did have a high serum eosinophil count a few weeks prior to admission (unclear significance).  Saglam B, Paulita Fujita M, Ornek S, Keske S, Tabak L, Cakar N, Zeren H, Aytekin S, [place], Ferhanoglu B. Bortezomib induced pulmonary toxicity: a case report and review of the literature. Am J Blood Res. 2020 Dec 15;10(6):407-415. PMID: 85885027; PMCID: [id number].  06/18/21 -   Results for CLAYTEN, ALLCOCK" (MRN [id number]) as of 07/12/2021 09:13  Ref. Range 06/18/2021 12:57  Monocyte-Macrophage-Serous Fluid Latest Ref Range: 50 - 90 % 5 (L)  Other Cells, Fluid Latest Units: % CORRELATE WITH CYTOLOGY.  Color, Fluid Latest Ref Range: YELLOW  PINK (A)  Total  Nucleated Cell Count, Fluid Latest Ref Range: 0 - 1,000 cu mm 103  Fluid Type-FCT Unknown BRONCHIAL ALVEOLAR LAVAGE  Lymphs, Fluid Latest Units: % 18  Eos, Fluid Latest Units: % 51  Appearance, Fluid Latest Ref Range: CLEAR  HAZY (A)  Neutrophil Count, Fluid Latest Ref Range: 0 - 25 % 26 (H)  FINAL MICROSCOPIC DIAGNOSIS:  - No malignant cells identified  - Benign bronchial cells and pulmonary macrophages   Titrate off solumedrol and start prednisone 50 mg daily. Decrease by 10 mg weekly    OV 07/12/2021  Subjective:  Patient ID: Samuel [name], male , DOB: 1956-08-06 , age 66 y.o. , MRN: [id number] , ADDRESS: [place] [id number] PCP Antony Contras, MD Patient Care Team: Antony Contras, MD as PCP - General (Family Medicine)  This Provider for this visit: Treatment Team:  Attending Provider: Brand Males, MD    07/12/2021 -   Chief Complaint  Patient presents with   Hospitalization Follow-up    Pt states he was doing better after being out of the hospital after being placed on steroids. States about a week after being out, states he started having more problems  with SOB and to today, he has had problems with SOB with exertion.   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 -presents for follow-up from the hospital.  I originally met him 10 years ago and then at that point in time he had dyspnea.  He tells me that after that he was exercising and living a good life.  Then in April 2022 got diagnosed incidentally with myeloma multiple.  Says he was caught early.  Then in May 2022 started on [name] and [name].  He says he was doing well with this up until mid July.  Up until then he was walking 1-1/2 2 miles per day 5 days a week.  Then in late July 2022 started noticing shortness of breath.  He met Dr. [name] his oncologist on May 25, 2021.  By June 01, 2021 he had severe pulmonary  infiltrates.  He says a part of then he was getting his [name] once a week along with some steroids with each dose.  At this point in time the steroid dose was cut down but he progressed and started getting worse.  Then on June 15, 2021 he was in significant respiratory distress and admitted to the hospital.  I reviewed the hospital records.  He was there for 3 days.  He underwent bronchoscopy with BAL there is also 51% eosinophilia.  Cultures are negative malignant cells are negative.  He was seen by my pulmonary colleagues.  Discussion was held with the [name] other [name] was the etiology.  Given the use and failure it looks like his [name] has been held.  He was discharged on 50 mg prednisone with advised to taper it by 10 mg/week.  Currently is on 20 mg prednisone.  He started playing golf again and feeling really good through Labor Day weekend..  Then on June 29, 2021 his [name] was not given.  Up until this point all chemo was held.  He was given Cytoxan, Zomenta and [name].  The very next in June 30, 2021 he started feeling worse.  He feels [name] is the etiology for this.  On July 06, 2021 he communicated this with Dr. [name] and all chemo has been held.  He is being referred to the bone marrow [facility] program at [facility] for further evaluation.  At this point in time he says he is much better than when he was in the hospital but definitely not as good as he was prior to getting a rechallenge with [name] on June 29, 2021.  But he notices that he slowly improving.  Walking desaturation test shows that his pulse ox is holding up although he does have a tendency to desaturate.   Did extensive review of the literature.  According to up-to-date [name] is a known etiology for pulm eosinophilia but he got worse after getting rechallenged with [name].  Review of the literature shows that [name] can cause drug-induced pneumonitis and a small fraction of patients.  The  literature is  NO INFOR  as to whether these patients have eosinophilia or not but definitely they seem to be steroid responsive.  This literature is attached below.  In the timeframe also fits in with [name].  He is also noticed to be on allopurinol which rarely can cause hypersensitivity syndrome or dermatitis, hepatitis and eosinophilia - ADDRESS Syndrome but he did not have thse features of rash etc     Nevertheless significant eosinophilia in  the BAL 51% suggest drug-induced pneumonitis.  He currently has significant symptoms of shortness of breath.  He does not have oxygen with him at home.  Simple office walk 185 feet x  3 laps goal with forehead probe 07/12/2021    O2 used ra   Number laps completed 3   Comments about pace slow   Resting Pulse Ox/HR 100% and 69/min   Final Pulse Ox/HR 92% and 81/min   Desaturated </= 88% no   Desaturated <= 3% points Yes 8   Got Tachycardic >/= 90/min no   Symptoms at end of test No complaints   Miscellaneous comments x        Causes of Pulmonary Eosinophilia: from UPTODATE A. Nonsteroidal antiinflammatory drugs (NSAIDs) B. Nitrofurantoin  C. Minocycline D. Sulfonamides/ Sulfamethoxazole E. Ampicillin F. Daptomycin G.Vancomycin H. Dapsone I. Beta-lactam antibiotics J. Nevirapine K. Telaprevir L. Sulfasalazine M. Methotrexate N. Mesalamine O. Amiodarone P. Bleomycin Q. ACE Inhibitor R. Beta blocker S. Hydrochlorothiazides T. L- Tryptophan U. Allopurinol V. Carbamazepine W. Lamotrigine X. Phenytoin Y. Phenindione Z. Fluindione AA. Olanzapine AB. Oxcarbazepine AC. Strontium Ranelate AD. Lenalidomide AE. Radiographic contrast media  Saglam B, Kalyon H, Ozbalak M, Ornek S, Keske S, Tabak L, Cakar N, Zeren H, Aytekin S, [place], [place] B. Bortezomib induced pulmonary toxicity: a case report and review of the literature. Am J Blood Res. 2020 Dec 15;10(6):407-415. PMID: 38182993; PMCID: [id number]. - Bortezomib  related pulmonary toxicities are rarely reported. Although the incidence of Bortezomib induced lung injury (BLI) is unknown, in a large registry study of 1010 MM patients, 45 patients were reported to have BLI by their physician. However, the causality could only be constituted in 26 patients (2.6%), with 5 of them resulting in death despite steroid treatment.In the case reports, the average number of RVD cycles until the toxicity was presented was 6.9, and the period between the development of pulmonary toxicity and the first dose of Bortezomib was 31.1 days -No mention of eosinophilia  Bortezomib therapy-related lung disease in [place] patients with multiple myeloma: Incidence, mortality and clinical characterization Dyann [facility] Miyao,2 Vidalia Masahiko Kusumoto,5 Storden Fumikazu BOFBP,1 [place] Sugiyama,8 Kiyohiko Hatake,9 [place] Pao Fukuda,10 and [place] patients registered, 81 (4.5%) developed BILD, 5 (0.50%) of whom had fatal cases. The median time to BILD onset from the first bortezomib dose was 14.5 days, and most of the patients responded well to corticosteroid therapy. A retrospective review by the Lung Injury Medical Expert Panel revealed that the types with capillary leak syndrome and hypoxia without infiltrative shadows were uniquely and frequently observed in patients with BIL - no mention of eosinophilia  CellularOperator.fi =- allopurinol complex multisystem Hypersensitivityey   has a past medical history of Asthma, High cholesterol, Perennial allergic rhinitis, Sciatic pain, right, Seasonal allergic rhinitis, and Thyroid disease.   reports that he quit smoki     07/20/2021  - Visit, NP [name]    65 year old male former smoker followed in our office for shortness of breath and interstitial lung disease.  Established with Dr. [name].  Initially  consulted when inpatient with our team in August/2022.  Chief complaint at that point time was dyspnea.  He was diagnosed with multiple myeloma in the spring 2022 when a bruise was discovered while he was recovering from a sports massage.  He went for further work-up and he was found to have abnormal lab work this led to a bone  marrow biopsy confirming diagnosis of multiple myeloma.  He has been followed by Dr. [name].  He was in the middle of treating him with VRD chemotherapy since May/20 04/2021.  He was in the middle of his fifth cycle.  Patient also had [place] in April/2022.  Chest CT in April/2022 showed ill-defined interstitial changes in the periphery.  Concern for [name] lung toxicity.  Patient also had a bronchoscopy performed in 06/18/2021.  Eosinophils 51%, lymphs 18%, no malignant cells identified patient was started on a long-term prednisone taper.  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  Still currently having significant symptoms of shortness of breath.    Last seen in our office on 07/12/2021 by Dr. [name].  Plan of care at that office visit was as follows: Stop [name], check chest x-ray, check CBC with differential, blood ESR, blood IgE, vitamin D, hemoglobin A1c, G6PD, walk test, check Ono at home, change prednisone to 50 mg daily for 2 weeks, then 40 mg daily for 2 weeks, then 30 mg daily for 2 weeks, then 20 mg daily for 2 weeks, 2-week follow-up with APP or Dr. [name], 4-week follow-up with Dr. [name] and 30-minute time slot.  Per chart review on 07/13/2021 [name] was discontinued.  Patient was also seen by Dr. [name] on 07/18/2021 I am unable to view this note.  Patient reporting that weight team would like for him to resume [name] at a maintenance dose of 10 mg.  This is the current plan.  He will see oncology next Friday for blood work.  Patient is scheduled to complete an overnight oximetry test next Tuesday.  He remains  adherent to his prednisone taper 50 mg daily.  Patient reports that this weekend he did play 9 holes of golf on Saturday and on Sunday.  He was limited by his physical exertion.  He also exercised on Tuesday and worked out with a Clinical research associate.  Patient is eager to get back to baseline physical activity.  Patient reports that on Wednesday (07/18/2021) of this week he had increased shortness of breath, cough, worsened acid reflux and he vomited.  Patient believes that this may also be due to the fact that he ate a dinner meal quite quickly.  This sometimes happens when he does this.  He also reports that his blood pressure was high at that time.  Patient reports that he has been off the [name] for at least 1 month.  Patient has stopped his allopurinol as of 07/18/2021.  Patient and spouse are both frustrated regarding dyspnea and have hopes that he would be improving quicker.  There are also concerns that he may have acute worsened symptoms or an acute infection such as bronchitis.  We will discuss and evaluate for this today.  Walk today in office: 07/20/2021-completed 2 laps on room air, dropped to 93%  07/26/2021- Interim hx  Patient presents today for 1 week follow-up. ILD felt to be related to drug pneumonitis from [name] as well as [name] for his treatment of multiple myeloma. BAL showed significant eosinophilia. Dr. [name] lowered dose of [name] to 49m daily. CXR on 07/20/21 showed chronic ILD, no definite acute findings. Ambulatory walk during his last visit showed no oxygen desaturations. During his last visit he was ordered for HRCT, PFTs and ONO.   Accompanied by his wife. He had a bad weekend, his respiratory symptoms have been slightly better the last two days. He is currently off BOTH [name] and [name] (may retry  [name] at lower dose). He stopped using BREO d/t throat irritation. Xanax has helped relieves some anxiety and chest tightness. Wife reports that he is sleeping better. He in  on prolonged prednisone taper. He will be starting 67m prednisone tomorrow  x 2 weeks. He is taking Singulair and generic fluticasone nasal spray. He has been off allergy shots since August. HRCT and PFTs are scheduled for next week. Awaiting results to be faxed for ONO from [employer].    OV 08/02/2021 -   Subjective:  Patient ID: VBillee [name] male , DOB: 131-Aug-1957, age 65y.o. , MRN: [id number], [place]: [id number] SAntony Contras MD Patient Care Team: SAntony Contras MD as PCP - General (Family Medicine)  This Provider for this visit: Treatment Team:  Attending Provider: RBrand Males MD  Type of visit: Telephone/Video Circumstance: COVID-19 national emergency Identification of patient VDAMIEON ARMENDARIZwith 1November 02, 1957and MRN [id number]- 2 person identifier Risks: Risks, benefits, limitations of telephone visit explained. 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 CT abdomen/pelvis: Multiple small lytic lesions in the thoracolumbar spine and bilateral pelvis -SPEP: IgG 2,082 (H), M Protein 1.8 (H). IFE shows IgG monoclonal protein with kappa light chain specificity.  -LDH 169, CBC normal, CMP normal except for sodium 131 (L), Chloride 96 (L).   02/14/2021: Establish care with IDede QueryPA-C 02/22/2021: bone marrow biopsy confirms the diagnosis of Multiple Myeloma with a monoclonal plasma cell population.  03/16/2021: Cycle 1 Day 1 of VRd chemotherapy  04/06/2021: Cycle 2 Day 1 of VRd chemotherapy  04/27/2021: Cycle 3 Day 1 of VRd chemotherapy  05/18/2021: Cycle 4 Day 1 of VRd chemotherapy  06/01/2021: drop  dexamethasone to 244mPO weekly and start lasix due to shortness of breath.  06/15/2021: Desaturation to 87% on ambulation. HELD velcade today and sent to ED for evaluation.  06/22/2021: Findings consistent with drug reaction the lungs with eosinophils on BAL.  Given these findings we will definitely hold [name] and plan to avoid pomalidomide 06/29/2021: Cycle 1 Day 1 CyBorD chemotherapy   HPI ViPatty Lopezgarciaosco [age].o. -there is a telephone visit.  He is supposed to see me next week but he had a CT scan of the chest 2 days ago and had pulmonary function test today.  He really wanted to discuss the results today itself.  Review of the records indicate that he still on prednisone taper.  He is able to ambulate and desaturated only after 200 feet.  There are some mild nocturnal desaturation.  We started 2 L of nasal cannula oxygen.  His CT scan of the chest shows diffuse groundglass opacity in a pattern that is inconsistent with UIP [less than 40% chance this is UIP] suggestive of alternate pattern.  He says he is able to do weight training exercises well but when he walks on a treadmill or does ambulation that is when it bothers him.  When he rests he is better.  He does desaturate to 80s percent.  He is wondering if this could be from asthma.  I told him otherwise.  Touch base with Dr. DoLorenso Courieris oncologist.  He is  scheduled for cyclophosphamide tomorrow along with low-dose [name].  This was held off recently in September 2022.  But oncology is wanting to rechallenge him with low-dose [name].  They wanted approval for this.   ONO RA 07/24/21   - - 45 min spent <88%. 7+ hours was > 90%. OVerall not bad  Plan  Start 2L [place] QHS  Results for HARGIS, VANDYNE" (MRN [id number]) as of 08/02/2021 12:16  Ref. Range 08/02/2021  08/02/2021   FVC-Pre Latest Units: L 1.55   FVC-%Pred-Pre Latest Units: % 33   Results for JAVONTA, GRONAU" (MRN [id number]) as of 08/02/2021 12:16  Ref. Range  08/02/2021    DLCO cor Latest Units: ml/min/mmHg 12.52   DLCO cor % pred Latest Units: % 46   Results for JAXAN, MICHEL" (MRN [id number]) as of 08/02/2021 12:16  Ref. Range 08/02/2021    TLC Latest Units: L 4.18   TLC % pred Latest Units: % 59    Results for JIRO, KIESTER" (MRN [id number]) as of 08/02/2021 12:16  Ref. Range 07/12/2021 09:54  G-6PDH Latest Ref Range: 7.0 - 20.5 U/g Hgb 14.8   CT Chest data  CT Chest High Resolution  Result Date: 07/31/2021 CLINICAL DATA:  Evaluate for interstitial lung disease EXAM: CT CHEST WITHOUT CONTRAST TECHNIQUE: Multidetector CT imaging of the chest was performed following the standard protocol without intravenous contrast. High resolution imaging of the lungs, as well as inspiratory and expiratory imaging, was performed. COMPARISON:  Chest CT dated June 15, 2021; abdomen and pelvis CT dated February 02, 2021 FINDINGS: Cardiovascular: Cardiomegaly with trace pericardial effusion. Coronary artery calcifications of the RCA and LAD. Atherosclerotic disease of the thoracic aorta. Mediastinum/Nodes: Esophagus is unremarkable. Atrophic thyroid. Mediastinal lymph nodes are decreased in size when compared with prior exam. Reference AP window lymph node on series 2, image 42 measures 1.1 cm in short axis, previously 1.3 cm. Lungs/Pleura: Central airways are patent. Images are motion degraded. Mild diffuse ground-glass opacity with peribronchovascular and subpleural reticular glass opacities and traction bronchiectasis. No clear craniocaudal predominance. Mild bilateral air trapping. 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 Stable solid right middle lobe pulmonary nodule measuring 3 mm on series 3, image 57. Upper Abdomen: No acute abnormality. Musculoskeletal: No chest wall mass or suspicious bone lesions identified. IMPRESSION: Limited evaluation due to respiratory motion artifact. Within limitations, there are diffuse bilateral  ground-glass opacities with peribronchovascular and subpleural reticular opacities, traction bronchiectasis and no clear craniocaudal predominance. Differential considerations include sequela of acute lung injury, NSIP, or fibrotic HP given presence of air trapping. Mild subpleural reticular opacities were present on visualized portions of the lung on prior abdomen and pelvis CT dated February 02, 2021, although majority of findings are new. Findings are suggestive of an alternative diagnosis (not UIP) per consensus guidelines: Diagnosis of Idiopathic Pulmonary Fibrosis: An Official ATS/ERS/JRS/ALAT Clinical Practice Guideline. [place], Iss 5, [phone], Jun 21 2017. Small solid pulmonary nodule the right middle lobe measuring 3 mm. No follow-up needed if patient is low-risk. Non-contrast chest CT can be considered in 12 months if patient is high-risk. This recommendation follows the consensus statement: Guidelines for Management of Incidental Pulmonary Nodules Detected on CT Images: From the Fleischner Society 2017; Radiology 2017; 284:228-243. Aortic Atherosclerosis (ICD10-I70.0). Electronically Signed   By: Yetta Glassman M.D.   On: 07/31/2021 12:01         08/16/2021 -  fu drug induced  pneumonitis   HPI Samuel [name] 65 y.o. -26md desaturated. STarted on portable o2 since yesterday and Is feeling better. On Room air say he is desaturated.  Called to discuss Bronch . He is scheduled to see DR PAtwardhan 08/22/21 wed at 8.30am. PRefers not to have bronch done at that that tme.  Discussed the consensus about having bronchoscopy with lavage to rule out any opportunistic infections.  At this time I took the opportunity of also recommending transbronchial biopsies.  He did have transbronchial biopsies when he was a lot more hypoxemic in August 2022 we will send for histopathology and was nondiagnostic.  This time I recommended we send it off for RNA genomic classifier for UIP it is  not a sensitive test but it is specific.  If it comes back positive then we would know that there is permanency to this and also its a marker of progression potentially.  He is willing to go through this.  Explained we under general anesthesia. Based on schedule will be myself or Dr. IValeta Harmsdoing it.  Explained the following risks   Risks of pneumothorax, hemothorax, sedation/anesthesia complications such as cardiac or respiratory arrest or hypotension, stroke and bleeding all explained. Benefits of diagnosis but limitations of non-diagnosis also explained. Patient verbalized understanding and wished to proceed.    We then discussed pulse dose steroids.  Told him we will have to wait close to a week to make sure there is fungal smears PCP and AFB smears and bacterial culture negative.  At that point we will have to take a decision on giving 1 g Solu-Medrol for the a day for 3 days.  Ideally would need admission he does not want this.  He wants to do with is on an outpatient setting ideally.  Explained to him about the risks with steroids such as hyperglycemia, hypertension but said that I would try to work with the DME company or outpatient/home nursing to see if this would be possible.  He was appreciative.  Also discussed with Dr. IValeta Harmswho is willing to do the biopsy based on schedule of the operating room, myself and the patient if things do not work out.  Sent a secure chat to Dr. PVirgina Jock  Told him about the patient.  He will give [place] clearance on 08/22/2021.  Recommended also look for BNP and heart failure.  CT Chest data  No results found.   OV 08/09/2021  Subjective:  Patient ID: VBillee [name] male , DOB: 103-Dec-1957, age 65y.o. , MRN: [id number], [address]: [id number] SAntony Contras MD Patient Care Team: SAntony Contras MD as PCP - General (Family Medicine)  This Provider for this visit: Treatment Team:  Attending Provider: RBrand Males  MD    08/09/2021 -   Chief Complaint  Patient presents with   Follow-up    Pt states he is about the same since last visit, maybe a little better. Pt is coughing more at times and states he does cough a lot before bed.   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 -returns for follow-up.  He is finishing up 40 mg of prednisone per day.  Restarting 30 mg/day of prednisone tomorrow.  He is not really better.  Today he was able to only walk 2 out of the customary 3 laps.  And he showed a tendency to desaturate.  He says  he is extremely anxious.  This is understandable.  He also states that when he lifts weights in the gym he does not have a problem but when he exerts himself he feels worse.  Previously his nocturnal desaturation test showed abnormality and we recommended night oxygen but he declined per the CMA.  But today he and his wife tell me that they would be interested in getting some oxygen if it would help with shortness of breath.  Simple walking desaturation test does not make him qualify and will need a 6-minute walk test to qualify for portable oxygen.  They are also worried about the lack of improvement.  They are wondering about second opinion.  I did unofficially check with some of my colleagues in the [place].  No clear-cut plan has been developed.  We discussed about the possibility of visiting Dr. Virgel Manifold, ILD group in [place].  They seemed enthused about the idea but wanted me to reach out to Dr. Ovid Curd first.  In terms of his myeloma review of the medical records from office visit 08/03/2021 with Dr. [name] and talking to the patient and the labs show the myeloma still in remission.  But Dr. [name] is extremely concerned that the myeloma will come back.  Oncology still wants to rechallenge him with [name] but after my personal discussion with Dr. [name] on  08/02/2021 and concern for pulmonary toxicity chemotherapy is on hold till patient recovers.  He is also concerned about the presence of coronary artery calcification on his recent CT scan of the chest.  His dad had coronary artery disease diagnosed at 17 and had bypass.  He wants to visit this with Dr. Einar Gip again       08/31/2021 -video visit to discuss bronchoscopy results from 08/28/2021 and next step in plan    HPI Samuel [name] 65 y.o. -his bronchoscopy with lavage 08/28/2021 shows 40% lymphocytes.  Anything greater than 30% is against UIP.  His RNA genomic classifier is in progress and that would be a specific test although not a ensitive test for UIP.  Bacterial cultures are negative.  He continues have shortness of breath and on room air at rest he is fine but sometimes when he goes to the bathroom he can desaturate into the high 80s.  He is frustrated by his condition.  Our original plan was to ensure no opportunistic or bacterial infections [fully understanding that MTB and fungal infections can take 6 weeks] with the current bronchoscopy and to consider bronchoalveolar lavage.  And then based on this we will schedule pulsed dose steroids.    Recently his G6PD is returned is normal.  He is not on Bactrim for prophylaxis.  Current prednisone Is 68m per day.  He prefers outpatient treatment plan.  We discussed the side effects of high-dose steroids including opportunistic infection, anxiety and irritability, hypertension, diabetes, other lab abnormalities.  Explained the upset benefit of potentially improving upon current ILD active inflammatory phase.  He is willing to go through this treatment.  He prefers outpatient.  He will require lab and vital sign monitoring.   his wife wanted to know if this could be sarcoidosis.  She is read some case reports of sarcoidosis and myeloma associated.  I expressed to them the clinical features do not fit in with sarcoidosis.  However we can check for  autoimmune and sarcoid features  Results for TDAXTIN, LEIKER (MRN [id number] as of 08/31/2021 16:38 ENVISIA - NEGATIVE FOR UIP  Ref. Range 06/18/2021 12:57 08/28/2021 16:51  Monocyte-Macrophage-Serous Fluid Latest Ref Range: 50 - 90 % 5 (L) 10 (L)  Other Cells, Fluid Latest Units: % CORRELATE WITH CYTOLOGY. MESOTHELIAL AND BRONCHIAL LINING CELLS  Color, Fluid Latest Ref Range: YELLOW  PINK (A) COLORLESS (A)  Total Nucleated Cell Count, Fluid Latest Ref Range: 0 - 1,000 cu mm 103 183  Fluid Type-FCT Unknown BRONCHIAL ALVEOLAR LAVAGE BRONCHIAL ALVEOLAR LAVAGE  Lymphs, Fluid Latest Units: % 18 40  Eos, Fluid Latest Units: % 51 0  Appearance, Fluid Latest Ref Range: CLEAR  HAZY (A) CLEAR (A)  Neutrophil Count, Fluid Latest Ref Range: 0 - 25 % 26 (H) 50 (H)    CT Chest data  DG CHEST PORT 1 VIEW  Result Date: 08/28/2021 CLINICAL DATA:  Status post bronchoscopy. EXAM: PORTABLE CHEST 1 VIEW COMPARISON:  Chest radiographs 07/20/2021 and CT 07/30/2021 FINDINGS: The cardiac silhouette is borderline enlarged. Lung volumes are chronically low and slightly lower than on the prior radiographs. The interstitial markings are chronically increased diffusely. No definite acute airspace consolidation, overt pulmonary edema, sizable pleural effusion, or pneumothorax is identified. Prominent gaseous distension of the stomach is partially visualized. IMPRESSION: Low lung volumes with chronic interstitial changes. Electronically Signed   By: Logan Bores M.D.   On: 08/28/2021 19:10   DG C-ARM BRONCHOSCOPY  Result Date: 08/28/2021 C-ARM BRONCHOSCOPY: Fluoroscopy was utilized by the requesting physician.  No radiographic interpretation.       OV 09/25/2021  Subjective:  Patient ID: Samuel [name], male , DOB: February 02, 1956 , age 52 y.o. , MRN: [id number] , ADDRESS: [place] [id number] PCP Antony Contras, MD Patient Care Team: Antony Contras, MD as PCP - General (Family  Medicine)  This Provider for this visit: Treatment Team:  Attending Provider: Brand Males, MD    09/25/2021 -   Chief Complaint  Patient presents with   Follow-up    Pt states that he is beginning to feel better after last visit. States he wears his O2 at 2L majority of the time.  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 -returns for follow-up.  Since his last visit we did a loading dose of Solu-Medrol 1 g daily x3 days.  This was in mid November 2022.  After that he is gone back to daily prednisone 20 mg/day.  In the midst of the high-dose steroid he did pick up hypertension and we gave him bisoprolol which he says has helped him significantly.  He is run out of the bisoprolol.  I have asked him to contact his primary care physician to manage his hypertension but we will give him a refill.    He is here for follow-up to see his current status.  He tells me that his effort tolerance is better.  He tells me in the gym he is able to do a little bit more work.  This is compared to a few months ago.  He does tell me that gym exercises are easier on him than climbing the stairs.  Stairs - he avoids and gets dyspneic. Not tested his pulse ox on stairs His subjective symptom profile is slightly better compared to October 2022 but his walking desaturation test is around the same.   So suspect amount of his interstitial lung disease  might be better but suspect still remains.  Review of his pulmonary function test from 10 years ago was normal.  In April 2022 he had early ILD.  He currently definitely has ILD.  His RNA genomic classifier is negative.  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  This would make him eligible for nintedanib. He continues on prednisone 20  mg/day.  In terms of his myeloma: He had his wife say that it is still under remission but they are worried about relapse.  They are worried about future direction and treatment of myeloma particularly because he has had issues with treatment that then resulted in acute lung injury.   Of note  - he is frustrated by poor customer service of our office workflows - Christella Scheuermann denies his RNA genomic classifer biopsy        SYMPTOM SCALE - ILD 08/09/2021 09/25/2021 218#  Current weight    O2 use ra ra  Shortness of Breath 0 -> 5 scale with 5 being worst (score 6 If unable to do)   At rest 0 0  Simple tasks - showers, clothes change, eating, shaving 2 2  Household (dishes, doing bed, laundry) x na  Shopping 3 1  Walking level at own pace 4 2  Walking up Stairs 5 4  Total (30-36) Dyspnea Score 14 9  How bad is your cough? 3 1  How bad is your fatigue 0 2  How bad is nausea 0 0  How bad is vomiting?  0 0  How bad is diarrhea? 0 0  How bad is anxiety? 5 2  How bad is depression 2 1  Any chronic pain - if so where and how bad x x      Prednisone history: 03/05/21 - dexamethasone 77m tabs - 40 tabs for 28 day supply - Dr. JNarda Rutherford- 4732monce weekly (for multiple myeloma chemotherapy N/V ppx)   04/09/21 - dexamethasone 32m832mabs - 40 tabs for 28 day supply - Dr. JohNarda Rutherford47m432mce weekly  (for multiple myeloma chemotherapy N/V ppx)   05/07/21 - dexamethasone 32mg 5ms - 40 tabs for 28 day supply - Dr. John Narda Rutherfordmg 21m weekly  (for multiple myeloma chemotherapy N/V ppx)   06/06/21 - dexamethasone 32mg ta32m- 40 tabs for 28 day supply - Dr. John DoNarda Rutherfordeased to 20mg on79meekly  06/18/21 - prednisone 10mg tab72m104 tabs for 34 day supply - Dr. Jennifer Dessa Phimg dail2m7 days, 40 mg daily x 7 days, 30 mg daily x 7 days, 20mg daily25m days, 10mg daily 70mdays, 5mg daily x 65mays   07/13/21 - prednisone 10mg tabs - 2732mab for 30 day supply - Dr.  Hubert Raatz ----Chase Callerily x 151mys, 40 mg daily x 14 days, 30mg daily x 1487ms, 20mg daily there65mr  - Mid Nov 2022  - 1gm solumedrol load x 3 days as outpatient  - 09/25/2021 - 20mg pred per day332m   Simple office walk 185 feet x  3 laps goal with forehead probe 07/12/2021  08/09/2021  09/25/2021   O2 used ra Ra3 ra  Number laps completed 3 3 but did oly 2 3  Comments about pace slow slow slow  Resting Pulse Ox/HR 100% and 69/min 100%ad 74 98% and 75  Final Pulse Ox/HR 92% and 81/min 93% and 87 91% and 91  Desaturated </= 88% no no yes  Desaturated <= 3% points Yes 8 Yes, 7 pponts Yes, 7 points  Got Tachycardic >/= 90/min no no yes  Symptoms at end of test No complaints Mod-severe dyspnea Mild dyspnea  Miscellaneous comments x Worse? stable      PFT  PFT Results Latest Ref Rng & Units 06/15/2021  FVC-Pre L 1.55  FVC-Predicted Pre % 33  FVC-Post L 1.45  FVC-Predicted Post % 31  Pre FEV1/FVC % % 83  Post FEV1/FCV % % 83  FEV1-Pre L 1.28  FEV1-Predicted Pre % 37  FEV1-Post L 1.20  DLCO uncorrected ml/min/mmHg 12.00  DLCO UNC% % 44  DLCO corrected ml/min/mmHg 12.52  DLCO COR %Predicted % 46  DLVA Predicted % 118  TLC L 4.18  TLC % Predicted % 59  RV % Predicted % 95     has a past medical history of Asthma, GERD (gastroesophageal reflux disease), High cholesterol, History of blood transfusion, Hypothyroidism, Interstitial lung disease (HCC), Multiple myeloma (HCC), Perennial allergic rhinitis, Pneumonia, Sciatic pain, right, Seasonal allergic rhinitis, and Thyroid disease.   reports that he quit smoking about 38 years ago. His smoking use included cigarettes. He has a 0.30 pack-year smoking history. He has never used smokeless tobacco.  Past Surgical History:  Procedure Laterality Date   BRONCHIAL BIOPSY  06/18/2021   Procedure: BRONCHIAL BIOPSIES;  Surgeon: Collene Gobble, MD;  Location: [facility];  Service: Cardiopulmonary;;   BRONCHIAL BIOPSY   08/28/2021   Procedure: BRONCHIAL BIOPSIES;  Surgeon: Garner Nash, DO;  Location: [facility];  Service: Cardiopulmonary;;   BRONCHIAL WASHINGS  06/18/2021   Procedure: BRONCHIAL WASHINGS;  Surgeon: Collene Gobble, MD;  Location: [facility];  Service: Cardiopulmonary;;   BRONCHIAL WASHINGS  08/28/2021   Procedure: BRONCHIAL WASHINGS;  Surgeon: Garner Nash, DO;  Location: [facility];  Service: Cardiopulmonary;;   NASAL SINUS SURGERY     NECK SURGERY  1996   VIDEO BRONCHOSCOPY N/A 06/18/2021   Procedure: VIDEO BRONCHOSCOPY WITH FLUORO;  Surgeon: Collene Gobble, MD;  Location: [facility];  Service: Cardiopulmonary;  Laterality: N/A;   VIDEO BRONCHOSCOPY N/A 08/28/2021   Procedure: VIDEO BRONCHOSCOPY WITH FLUORO;  Surgeon: Garner Nash, DO;  Location: [facility];  Service: Cardiopulmonary;  Laterality: N/A;    Allergies  Allergen Reactions   Clarithromycin Other (See Comments)    Hiccups   Penicillins Other (See Comments)    Child hood    Immunization History  Administered Date(s) Administered   Fluad Quad(high Dose 65+) 08/09/2021   Influenza Split 10/27/2017   Influenza, High Dose Seasonal PF 12/01/2017, 11/29/2019, 12/19/2020   Influenza, Quadrivalent, Recombinant, Inj, Pf 07/28/2019, 08/11/2020   Influenza-Unspecified 11/26/2011, 07/21/2017   PFIZER(Purple Top)SARS-COV-2 Vaccination 12/27/2019, 01/24/2020, 09/21/2020   Tdap 08/20/2005, 09/07/2015   Zoster, Live 11/06/2017, 05/07/2018    Family History  Problem Relation Age of Onset   Hypertension Mother    Allergies Mother    Allergies Father    CAD Father 61   Breast cancer Paternal Grandmother    Hypertension Other    Heart disease Other      Current Outpatient Medications:    acetaminophen (TYLENOL) 500 MG tablet, Take 1,000 mg by mouth every 6 (six) hours as needed for mild pain, moderate pain or headache., Disp: , Rfl:    acyclovir (ZOVIRAX) 400 MG tablet, Take 1 tablet (400 mg total) by mouth  2 (two) times daily., Disp: 60 tablet, Rfl: 3   albuterol (VENTOLIN HFA)  108 (90 Base) MCG/ACT inhaler, Inhale 2 puffs into the lungs every 6 hours as needed for wheezing or shortness of breath., Disp: 8.5 g, Rfl: 1   allopurinol (ZYLOPRIM) 300 MG tablet, Take 300 mg by mouth daily., Disp: , Rfl:    ALPRAZolam (XANAX) 1 MG tablet, Take 0.33 mg by mouth at bedtime., Disp: , Rfl:    aspirin EC 81 MG tablet, Take 81 mg by mouth daily. Swallow whole., Disp: , Rfl:    ASSESS FULL RANGE PEAK METER DEVI, as directed., Disp: , Rfl:    cholecalciferol (VITAMIN D) 1000 UNITS tablet, Take 3,000 Units by mouth daily., Disp: , Rfl:    EPINEPHrine (EPI-PEN) 0.3 mg/0.3 mL DEVI, Inject 0.3 mg into the muscle as needed., Disp: , Rfl:    ezetimibe-simvastatin (VYTORIN) 10-40 MG per tablet, Take 1 tablet by mouth at bedtime., Disp: , Rfl:    fluticasone-salmeterol (ADVAIR HFA) 230-21 MCG/ACT inhaler, Inhale 2 puffs into the lungs 2 (two) times daily., Disp: 1 each, Rfl: 12   furosemide (LASIX) 20 MG tablet, Take 1 tablet (20 mg total) by mouth as needed for fluid or edema., Disp: 14 tablet, Rfl: 1   lenalidomide ([name]) 10 MG capsule, TAKE 1 CAPSULE DAILY FOR 14 DAYS, NONE FOR 7 DAYS, Disp: 14 capsule, Rfl: 0   levothyroxine (SYNTHROID) 150 MCG tablet, Take 150 mcg by mouth every other day. Alternate, Disp: , Rfl:    levothyroxine (SYNTHROID) 175 MCG tablet, Take 1 tablet by mouth every other day., Disp: , Rfl:    montelukast (SINGULAIR) 10 MG tablet, Take 10 mg by mouth at bedtime., Disp: , Rfl:    Multiple Vitamin (MULTIVITAMIN) tablet, Take 1 tablet by mouth daily., Disp: , Rfl:    omeprazole (PRILOSEC) 40 MG capsule, Take 1 capsule by mouth daily., Disp: , Rfl:    ondansetron (ZOFRAN) 8 MG tablet, Take 1 tablet (8 mg total) by mouth every 8 (eight) hours as needed for nausea or vomiting., Disp: 30 tablet, Rfl: 0   OXYGEN, Inhale 2 L into the lungs continuous., Disp: , Rfl:    predniSONE (DELTASONE) 10 MG  tablet, Take 2 tablets (20 mg total) by mouth daily with breakfast., Disp: 60 tablet, Rfl: 0   prochlorperazine (COMPAZINE) 10 MG tablet, Take 1 tablet (10 mg total) by mouth every 6 (six) hours as needed for nausea or vomiting., Disp: 30 tablet, Rfl: 0   sildenafil (REVATIO) 20 MG tablet, Take 20 mg by mouth daily as needed (ED)., Disp: , Rfl:    sodium chloride (OCEAN) 0.65 % SOLN nasal spray, Place 1 spray into both nostrils as needed for congestion., Disp: , Rfl:    tamsulosin (FLOMAX) 0.4 MG CAPS capsule, Take 0.4 mg by mouth daily., Disp: , Rfl:    triamcinolone cream (KENALOG) 0.1 %, Apply 1 application topically daily as needed (sun burn itch)., Disp: , Rfl:    bisoprolol (ZEBETA) 5 MG tablet, Take 0.5 tablets (2.5 mg total) by mouth daily., Disp: 30 tablet, Rfl: 0   [START ON 09/26/2021] sulfamethoxazole-trimethoprim (BACTRIM DS) 800-160 MG tablet, Take 1 tablet by mouth 3 (three) times a week., Disp: 38 tablet, Rfl: 3      Objective:   Vitals:   09/25/21 1643  BP: 130/74  Pulse: 75  Temp: 98 F (36.7 C)  TempSrc: Oral  SpO2: 98%  Weight: 216 lb 3.2 oz (98.1 kg)  Height: _0  (1.778 m)    Estimated body mass index is 31.02 kg/m as calculated from  the following:   Height as of this encounter: _0  (1.778 m).   Weight as of this encounter: 216 lb 3.2 oz (98.1 kg).  _1 @  Filed Weights   09/25/21 1643  Weight: 216 lb 3.2 oz (98.1 kg)     Physical Exam    General: No distress. Looks well Neuro: Alert and Oriented x 3. GCS 15. Speech normal Psych: Pleasant Resp:  Barrel Chest - no.  Wheeze - no, Crackles - maybe, mild bse, No overt respiratory distress CVS: Normal heart sounds. Murmurs - no Ext: Stigmata of Connective Tissue Disease - no HEENT: Normal upper airway. PEERL +. No post nasal drip        Assessment:       ICD-10-CM   1. ILD (interstitial lung disease) ([place])  J84.9     2. Current chronic use of systemic steroids  Z79.52     3.  Hypertension, unspecified type  I10     4. Insurance coverage problems  Z59.89     1 He had normal PFT 10 years ago. Apeprs to have had early ILD April 2022 at time of covid (unrelated to covid). Then revlimix and velcade x 1 -> with ILD flare with eosinophilia -> steroids and getting better -> then revlimid held and given velcade and worse -> then steroids and pulse load 3gm in 3 days load mid nov 2022 -> since then subjectively better/walk est stable  Concern that ILD still + but features are NOT c/w IPF/UIP  Compared to spring 2022 this is progressive phenotpype. So given chemo needs etc.,best to start anti fibrotic. Discussed ofev as first ime. No contraindications.  This could lung protect him while he undergoes chemo etc. But still need to avoid drugs that are pulm-toxic  Conitinue pred for now  He and wife agreeable with plan    Plan:     Patient Instructions  ILD (interstitial lung disease) ([place]) Current chronic use of systemic steroids  - will classif you as ILD progressive since summer 2022 though with recent steroid you are better/stable  Plan  - continue o2 with exertion as before  - start ofev 19m twice daily as anti-fibrotic protective effect  [facility]  - keep up PFT test later in dec 2022 - continue prednisone 228mper day for now -continue bactrim 1DS tab three times weekly for infection prevention - addendum after he left - will order serology    Hypertension, unspecified type - prob due to steroids   - refill bisoprolol 2.82m21maily for 30 days - then from PCP  Myeloma    - per Dr DorLorenso Courier if lungs stabilize then we just accept lungs as they are, take ofev to protect lungs and do myeloma treatment choosing options that are least hurtful to lung ' Insurance coverage problems  - [employer] denied his RNA genomic classifer  Plan  - will ask our office mangement to followup  Customer service issues  - sorry for  snafus  Followup  - video visit late Dec 2022 or early Jan 2022 (ok to schedule when I Am in ICU block in the afternoon)   ( Level 05 visit: Estb 40-54 min  in  visit type: on-site physical face to visit  in total care time and counseling or/and coordination of care by this undersigned MD - Dr MurBrand Maleshis includes one or more of the following on this same day 09/25/2021: pre-charting, chart review, note writing, documentation discussion  of test results, diagnostic or treatment recommendations, prognosis, risks and benefits of management options, instructions, education, compliance or risk-factor reduction. It excludes time spent by the [facility] or office staff in the care of the patient. Actual time 79 min)   SIGNATURE    Dr. Brand Males, M.D., F.C.C.P,  Pulmonary and Critical Care Medicine Staff Physician, [place] Director - Interstitial Lung Disease  Program  Pulmonary [facility] [place], [place], [address]  Pager: [phone], If no answer or between  15:00h - 7:00h: call [phone] Telephone: [phone]  9:42 PM 09/25/2021

## 2021-09-25 NOTE — Telephone Encounter (Signed)
[name]  Do not see ANA, RF, CCP, DSDNA, scl-70, SSA/SSB, Hypersensitivey panel and Quantiferon gold on him. Please order it. He can come a his convenices and do it  Pls give him video visit say 28th or 29th dec in afternoon. I will do from [employer]  MR

## 2021-09-25 NOTE — Patient Instructions (Addendum)
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 though with recent steroid you are better/stable  Plan  - continue o2 with exertion as before  - start ofev 150mg  twice daily as anti-fibrotic protective effect  - refer pharmacist [facility] for counseling  - keep up PFT test later in dec 2022 - continue prednisone 20mg  per day for now -continue bactrim 1DS tab three times weekly for infection prevention - addendum after he left - will order serology    Hypertension, unspecified type - prob due to steroids   - refill bisoprolol 2.5mg  daily for 30 days - then from PCP  Myeloma    - per Dr [name]  - if lungs stabilize then we just accept lungs as they are, take ofev to protect lungs and do myeloma treatment choosing options that are least hurtful to lung ' Insurance coverage problems  - [employer] denied his RNA genomic classifer  Plan  - will ask our office mangement to followup  Customer service issues  - sorry for snafus  Followup  - video visit late Dec 2022 or early Jan 2022 (ok to schedule when I Am in ICU block in the afternoon)

## 2021-09-25 NOTE — Telephone Encounter (Signed)
Samuel [name]  He starts medicare Jan 2023. New ofev start for progressive phenotype. He is worried about copay. Please eval  Thanks  MR

## 2021-09-27 ENCOUNTER — Other Ambulatory Visit (INDEPENDENT_AMBULATORY_CARE_PROVIDER_SITE_OTHER): Payer: Managed Care, Other (non HMO)

## 2021-09-27 ENCOUNTER — Telehealth: Payer: Self-pay | Admitting: Internal Medicine

## 2021-09-27 DIAGNOSIS — J189 Pneumonia, unspecified organism: Secondary | ICD-10-CM | POA: Diagnosis not present

## 2021-09-27 DIAGNOSIS — J849 Interstitial pulmonary disease, unspecified: Secondary | ICD-10-CM

## 2021-09-27 DIAGNOSIS — Z8579 Personal history of other malignant neoplasms of lymphoid, hematopoietic and related tissues: Secondary | ICD-10-CM | POA: Diagnosis not present

## 2021-09-27 DIAGNOSIS — Z7952 Long term (current) use of systemic steroids: Secondary | ICD-10-CM | POA: Diagnosis not present

## 2021-09-27 LAB — CBC WITH DIFFERENTIAL/PLATELET
Basophils Absolute: 0 10*3/uL (ref 0.0–0.1)
Basophils Relative: 0.3 % (ref 0.0–3.0)
Eosinophils Absolute: 0.2 10*3/uL (ref 0.0–0.7)
Eosinophils Relative: 2 % (ref 0.0–5.0)
HCT: 41.7 % (ref 39.0–52.0)
Hemoglobin: 13.8 g/dL (ref 13.0–17.0)
Lymphocytes Relative: 14.8 % (ref 12.0–46.0)
Lymphs Abs: 1.6 10*3/uL (ref 0.7–4.0)
MCHC: 33.1 g/dL (ref 30.0–36.0)
MCV: 97.1 fl (ref 78.0–100.0)
Monocytes Absolute: 0.7 10*3/uL (ref 0.1–1.0)
Monocytes Relative: 6.3 % (ref 3.0–12.0)
Neutro Abs: 8.2 10*3/uL — ABNORMAL HIGH (ref 1.4–7.7)
Neutrophils Relative %: 76.6 % (ref 43.0–77.0)
Platelets: 240 10*3/uL (ref 150.0–400.0)
RBC: 4.3 Mil/uL (ref 4.22–5.81)
RDW: 15.9 % — ABNORMAL HIGH (ref 11.5–15.5)
WBC: 10.7 10*3/uL — ABNORMAL HIGH (ref 4.0–10.5)

## 2021-09-27 LAB — BASIC METABOLIC PANEL
BUN: 18 mg/dL (ref 6–23)
CO2: 28 mEq/L (ref 19–32)
Calcium: 9.3 mg/dL (ref 8.4–10.5)
Chloride: 92 mEq/L — ABNORMAL LOW (ref 96–112)
Creatinine, Ser: 0.9 mg/dL (ref 0.40–1.50)
GFR: 89.83 mL/min (ref >60.00)
Glucose, Bld: 81 mg/dL (ref 70–99)
Potassium: 4.1 mEq/L (ref 3.5–5.1)
Sodium: 126 mEq/L — ABNORMAL LOW (ref 135–145)

## 2021-09-27 LAB — FUNGUS CULTURE WITH STAIN

## 2021-09-27 LAB — FUNGAL ORGANISM REFLEX

## 2021-09-27 LAB — FUNGUS CULTURE RESULT

## 2021-09-27 LAB — SEDIMENTATION RATE: Sed Rate: 51 mm/hr — ABNORMAL HIGH (ref 0–20)

## 2021-09-27 LAB — HEMOGLOBIN A1C: Hgb A1c MFr Bld: 6.2 % (ref 4.6–6.5)

## 2021-09-27 NOTE — Telephone Encounter (Signed)
Samuel [name], can you please add a slot for MR 12/28 at 3pm for a video visit?

## 2021-09-27 NOTE — Telephone Encounter (Signed)
Slot added and patient scheduled.

## 2021-09-27 NOTE — Telephone Encounter (Signed)
Samuel [name] is asking for his name, dob and cellphone so they can call him to see how to help him with envisia denia. Is it ok to give this information to them?  Please let me know andI can update Jackson Latino. Or else, he will have to call [employer]  himself

## 2021-09-27 NOTE — Telephone Encounter (Signed)
Called and spoke with pt letting him know that we were needing him to get labwork done and he verbalized understanding. Pt will come today 12/8 before 12pm to have labs drawn. Nothing further needed.

## 2021-09-28 ENCOUNTER — Ambulatory Visit: Payer: Managed Care, Other (non HMO) | Admitting: Physician Assistant

## 2021-09-28 ENCOUNTER — Telehealth: Payer: Self-pay | Admitting: Internal Medicine

## 2021-09-28 ENCOUNTER — Other Ambulatory Visit: Payer: Managed Care, Other (non HMO)

## 2021-09-28 ENCOUNTER — Ambulatory Visit: Payer: Managed Care, Other (non HMO)

## 2021-09-28 NOTE — Telephone Encounter (Signed)
Rx for pt's bisoprolol was sent to pt's pharmacy after OV with MR 12/6. Called and spoke with pt letting him know that this had been done and he verbalized understanding. Nothing further needed.

## 2021-09-28 NOTE — Telephone Encounter (Signed)
Ok I have let Samuel [name] know

## 2021-09-28 NOTE — Telephone Encounter (Signed)
I called and spoke with the pt  He states that it is okay with him if we provide [employer] with his name, DOB and mobile number  Forwarding to Dr [name] to make him aware

## 2021-09-30 LAB — QUANTIFERON-TB GOLD PLUS
Mitogen-NIL: 3.9 IU/mL
NIL: 0.03 IU/mL
QuantiFERON-TB Gold Plus: NEGATIVE
TB1-NIL: 0.01 IU/mL
TB2-NIL: 0.01 IU/mL

## 2021-10-01 ENCOUNTER — Telehealth: Payer: Self-pay | Admitting: Internal Medicine

## 2021-10-01 LAB — HYPERSENSITIVITY PNEUMONITIS
A. Pullulans Abs: NEGATIVE
A.Fumigatus #1 Abs: NEGATIVE
Micropolyspora faeni, IgG: NEGATIVE
Pigeon Serum Abs: NEGATIVE
Thermoact. Saccharii: NEGATIVE
Thermoactinomyces vulgaris, IgG: NEGATIVE

## 2021-10-01 LAB — ANTI-DNA ANTIBODY, DOUBLE-STRANDED: ds DNA Ab: <1 IU/mL

## 2021-10-01 LAB — CYCLIC CITRUL PEPTIDE ANTIBODY, IGG: Cyclic Citrullin Peptide Ab: <16 UNITS

## 2021-10-01 LAB — ANA: Anti Nuclear Antibody (ANA): NEGATIVE

## 2021-10-01 LAB — RHEUMATOID FACTOR: Rheumatoid fact SerPl-aCnc: <14 IU/mL (ref <14)

## 2021-10-01 LAB — SJOGREN'S SYNDROME ANTIBODS(SSA + SSB)
SSA (Ro) (ENA) Antibody, IgG: <1 AI
SSB (La) (ENA) Antibody, IgG: <1 AI

## 2021-10-01 LAB — ANGIOTENSIN CONVERTING ENZYME: Angiotensin-Converting Enzyme: 11 U/L (ref 9–67)

## 2021-10-01 LAB — MPO/PR-3 (ANCA) ANTIBODIES
Myeloperoxidase Abs: <1 AI
Serine Protease 3: <1 AI

## 2021-10-01 LAB — GLOMERULAR BASEMENT MEMBRANE ANTIBODIES: GBM Ab: <1 AI (ref <1.0)

## 2021-10-01 LAB — ANTI-SCLERODERMA ANTIBODY: Scleroderma (Scl-70) (ENA) Antibody, IgG: <1 AI

## 2021-10-01 NOTE — Telephone Encounter (Signed)
Looked at pt's med list and saw that [place] provider for pt's lasix was Dr. Narda Rutherford.

## 2021-10-01 NOTE — Telephone Encounter (Signed)
Autoimmune profile is normal. Means no need for any other imune suppressant. OFev start fine  Howeve, his Sodium is low. - this can make people have fatigue, dizziness, headaches and mild non specific symptoms - Most common cause is diuresis. HE should talk to PCP Antony Contras, MD . Also  pls find out please who put him on diuresis. Specifically was it [place]?  Thanks  MR

## 2021-10-02 NOTE — Telephone Encounter (Signed)
Reviewed blood work results with pt as dictated by Dr. [name]. Pt stated he was started Lasix by Dr. Geannie Risen and uses prn with wt increase. Pt stated understanding. Nothing further needed at this time.

## 2021-10-02 NOTE — Telephone Encounter (Signed)
Thanks . Please have him address [place] with PCP Antony Contras, MD and Dr [name]

## 2021-10-02 NOTE — Telephone Encounter (Signed)
Received prescriber portion of Ofev PAP application, pt portion is absent. Per MR, pt will be acquiring insurance coverage through Medicare beginning 2023. Will have to await new plan information before we can begin BIV process as any info provided now will no longer be relevant once pt changes insurance.  Provider portion placed in "Pending Info" folder. Will push encounter out for f/u in 2023.

## 2021-10-02 NOTE — Telephone Encounter (Signed)
Attempted to call pt but unable to reach. Left message for him to return call. °

## 2021-10-03 ENCOUNTER — Telehealth (INDEPENDENT_AMBULATORY_CARE_PROVIDER_SITE_OTHER): Payer: Managed Care, Other (non HMO) | Admitting: Pharmacist

## 2021-10-03 ENCOUNTER — Other Ambulatory Visit: Payer: Self-pay

## 2021-10-03 DIAGNOSIS — Z7189 Other specified counseling: Secondary | ICD-10-CM

## 2021-10-03 NOTE — Progress Notes (Signed)
Subjective:  Patient called today by [facility] pharmacy team via video call for Ofev new start counseling.   Patient was last seen and referred by Dr. [name] on 09/25/21. Pertinent past medical history includes ILD with progressive phenotype, possible asthma, allergic rhinitis. He has multiple myeloma. 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 Chemotherapy has been held d/t rec for further pulmonology workup  History of CAD: No History of MI: No Current anticoagulant use: No History of HTN: No  History of elevated LFTs: No History of diarrhea, nausea, vomiting: No  Objective: Allergies  Allergen Reactions   Clarithromycin Other (See Comments)    Hiccups   Penicillins Other (See Comments)    Child hood    Outpatient Encounter Medications as of 10/03/2021  Medication Sig Note   acetaminophen (TYLENOL) 500 MG tablet Take 1,000 mg by mouth every 6 (six) hours as needed for mild pain, moderate pain or headache.    acyclovir (ZOVIRAX) 400 MG tablet Take 1 tablet (400 mg total) by mouth 2 (two) times daily.    albuterol (VENTOLIN HFA) 108 (90 Base) MCG/ACT inhaler Inhale 2 puffs into the lungs every 6 hours as needed for wheezing or shortness of breath.    allopurinol (ZYLOPRIM) 300 MG tablet Take 300 mg by mouth daily. 08/22/2021: On hold   ALPRAZolam (XANAX) 1 MG tablet Take 0.33 mg by mouth at bedtime.    aspirin EC 81 MG tablet Take 81 mg by mouth daily. Swallow whole.    ASSESS FULL RANGE PEAK METER DEVI as directed.    bisoprolol (ZEBETA) 5 MG tablet Take 0.5 tablets (2.5 mg total) by mouth daily.    cholecalciferol (VITAMIN D) 1000 UNITS tablet Take 3,000 Units by mouth daily.    EPINEPHrine (EPI-PEN) 0.3 mg/0.3 mL DEVI Inject 0.3 mg into the muscle as needed.    ezetimibe-simvastatin (VYTORIN) 10-40 MG per tablet Take 1 tablet by mouth at bedtime.     fluticasone-salmeterol (ADVAIR HFA) 230-21 MCG/ACT inhaler Inhale 2 puffs into the lungs 2 (two) times daily.    furosemide (LASIX) 20 MG tablet Take 1 tablet (20 mg total) by mouth as needed for fluid or edema.    lenalidomide ([name]) 10 MG capsule TAKE 1 CAPSULE DAILY FOR 14 DAYS, NONE FOR 7 DAYS 08/22/2021: On hold   levothyroxine (SYNTHROID) 150 MCG tablet Take 150 mcg by mouth every other day. Alternate    levothyroxine (SYNTHROID) 175 MCG tablet Take 1 tablet by mouth every other day.    montelukast (SINGULAIR) 10 MG tablet Take 10 mg by mouth at bedtime.    Multiple Vitamin (MULTIVITAMIN) tablet Take 1 tablet by mouth daily.    omeprazole (PRILOSEC) 40 MG capsule Take 1 capsule by mouth daily.    ondansetron (ZOFRAN) 8 MG tablet Take 1 tablet (8 mg total) by mouth every 8 (eight) hours as needed for nausea or vomiting.    OXYGEN Inhale 2 L into the lungs continuous.    predniSONE (DELTASONE) 10 MG tablet Take 2 tablets (20 mg total) by mouth daily with breakfast.    prochlorperazine (COMPAZINE) 10 MG tablet Take 1 tablet (10 mg total) by mouth every 6 (six) hours as needed for nausea or vomiting.    sildenafil (REVATIO) 20 MG tablet Take 20 mg by mouth daily as needed (ED).    sodium chloride (OCEAN) 0.65 % SOLN nasal spray Place 1 spray into both nostrils as  needed for congestion.    sulfamethoxazole-trimethoprim (BACTRIM DS) 800-160 MG tablet Take 1 tablet by mouth 3 (three) times a week.    tamsulosin (FLOMAX) 0.4 MG CAPS capsule Take 0.4 mg by mouth daily.    triamcinolone cream (KENALOG) 0.1 % Apply 1 application topically daily as needed (sun burn itch).    No facility-administered encounter medications on file as of 10/03/2021.    Immunization History  Administered Date(s) Administered   Fluad Quad(high Dose 65+) 08/09/2021   Influenza Split 10/27/2017   Influenza, High Dose Seasonal PF 12/01/2017, 11/29/2019, 12/19/2020   Influenza, Quadrivalent, Recombinant, Inj, Pf  07/28/2019, 08/11/2020   Influenza-Unspecified 11/26/2011, 07/21/2017   PFIZER(Purple Top)SARS-COV-2 Vaccination 12/27/2019, 01/24/2020, 09/21/2020   Tdap 08/20/2005, 09/07/2015   Zoster, Live 11/06/2017, 05/07/2018    PFT's TLC  Date Value Ref Range Status  06/15/2021 4.18 L Final    CMP     Component Value Date/Time   NA 126 (L) 09/27/2021 0929   K 4.1 09/27/2021 0929   CL 92 (L) 09/27/2021 0929   CO2 28 09/27/2021 0929   GLUCOSE 81 09/27/2021 0929   BUN 18 09/27/2021 0929   CREATININE 0.90 09/27/2021 0929   CREATININE 0.78 08/31/2021 1024   CALCIUM 9.3 09/27/2021 0929   PROT 7.0 08/31/2021 1024   ALBUMIN 3.5 08/31/2021 1024   AST 19 08/31/2021 1024   ALT 26 08/31/2021 1024   ALKPHOS 42 08/31/2021 1024   BILITOT 0.4 08/31/2021 1024   GFRNONAA >60 08/31/2021 1024    CBC    Component Value Date/Time   WBC 10.7 (H) 09/27/2021 0929   RBC 4.30 09/27/2021 0929   HGB 13.8 09/27/2021 0929   HGB 14.1 08/31/2021 1024   HCT 41.7 09/27/2021 0929   PLT 240.0 09/27/2021 0929   PLT 171 08/31/2021 1024   MCV 97.1 09/27/2021 0929   MCH 32.1 08/31/2021 1024   MCHC 33.1 09/27/2021 0929   RDW 15.9 (H) 09/27/2021 0929   LYMPHSABS 1.6 09/27/2021 0929   MONOABS 0.7 09/27/2021 0929   EOSABS 0.2 09/27/2021 0929   BASOSABS 0.0 09/27/2021 0929    LFT's Hepatic Function Latest Ref Rng & Units 08/31/2021 08/03/2021 07/18/2021  Total Protein 6.5 - 8.1 g/dL 7.0 7.0 6.9  Albumin 3.5 - 5.0 g/dL 3.5 3.7 3.5  AST 15 - 41 U/L _0 ALT 0 - 44 U/L _1 Alk Phosphatase 38 - 126 U/L 42 57 68  Total Bilirubin 0.3 - 1.2 mg/dL 0.4 0.7 0.5    Chest CT (08/28/21) - Lung volumes are chronically low and slightly lower than on the prior radiographs. The interstitial markings are chronically increased diffusely  Assessment and Plan  Ofev Medication Management Thoroughly counseled patient on the efficacy, mechanism of action, dosing, administration, adverse effects, and monitoring parameters  of Ofev. Patient verbalized understanding. Patient education handout provided.   Goals of Therapy: Will not stop or reverse the progression of ILD. It will slow the progression of ILD.  Inhibits tyrosine kinase inhibitors which slow the fibrosis/progression of ILD -Significant reduction in the rate of disease progression was observed after treatment (61.1% [before] vs 33.3% [after], P?=?0.008) over 42 weeks.  Dosing: 150 mg (one capsule) by mouth twice daily (approx 12 hours apart). Discussed taking with food approximately 12 hours apart. Discussed that capsule should not be crushed or split.  Adverse Effects: Nausea, vomiting, diarrhea (2 in 3 patients) appetite loss, weight loss - management of diarrhea with loperamide discussed including max use of  48 hours and max of 8 capsules per day. Abdominal pain (up to 1 in 5 patients) Nasopharyngitis (13%), UTI (6%) Risk of thrombosis (3%) and acute MI (2%) - no history of  Hypertension (5%) Dizziness Fatigue (10%)  Monitoring: Monitor for diarrhea, nausea and vomiting, GI perforation, hepatotoxicity  Monitor LFTs - baseline, monthly for first 6 months, then every 3 months routinely CBC w differential at baseline and every 3 months routinely  Access: Approval of Ofev pending new Medicare coverage which will become active 10/21/21. He dropped of patient assistance application paperwork this morning and income was reviewed. He significantly exceeds income threshold to qualify for grants and patient assistance program. He is aware of this and is willing to pay copay price through insurance. Pharmacy team will process authorization after 10/21/21.  Medication Reconciliation A drug regimen assessment was performed, including review of allergies, interactions, disease-state management, dosing and immunization history. Medications were reviewed with the patient, including name, instructions, indication, goals of therapy, potential side effects,  importance of adherence, and safe use.  Anticoagulant use: No  Immunizations Patient has received 3 COVID19 vaccines. Patient is UTD influenza and zoster vaccine  This appointment required 60 minutes of patient care (this includes precharting, chart review, review of results, face-to-face care, etc.).  Thank you for involving pharmacy to assist in providing this patient's care.    Knox Saliva, PharmD, MPH, BCPS Clinical Pharmacist (Rheumatology and Pulmonology)

## 2021-10-10 ENCOUNTER — Inpatient Hospital Stay: Payer: Managed Care, Other (non HMO) | Attending: Hematology and Oncology

## 2021-10-10 ENCOUNTER — Other Ambulatory Visit: Payer: Self-pay

## 2021-10-10 ENCOUNTER — Inpatient Hospital Stay (HOSPITAL_BASED_OUTPATIENT_CLINIC_OR_DEPARTMENT_OTHER): Payer: Managed Care, Other (non HMO) | Admitting: Hematology and Oncology

## 2021-10-10 VITALS — BP 143/96 | HR 58 | Temp 97.4°F | Resp 17 | Wt 217.7 lb

## 2021-10-10 DIAGNOSIS — C9000 Multiple myeloma not having achieved remission: Secondary | ICD-10-CM | POA: Diagnosis present

## 2021-10-10 DIAGNOSIS — M899 Disorder of bone, unspecified: Secondary | ICD-10-CM

## 2021-10-10 DIAGNOSIS — R0602 Shortness of breath: Secondary | ICD-10-CM

## 2021-10-10 LAB — CMP (CANCER CENTER ONLY)
ALT: 23 U/L (ref 0–44)
AST: 20 U/L (ref 15–41)
Albumin: 4.1 g/dL (ref 3.5–5.0)
Alkaline Phosphatase: 36 U/L — ABNORMAL LOW (ref 38–126)
Anion gap: 6 (ref 5–15)
BUN: 17 mg/dL (ref 8–23)
CO2: 30 mmol/L (ref 22–32)
Calcium: 9.1 mg/dL (ref 8.9–10.3)
Chloride: 95 mmol/L — ABNORMAL LOW (ref 98–111)
Creatinine: 0.97 mg/dL (ref 0.61–1.24)
GFR, Estimated: >60 mL/min (ref >60)
Glucose, Bld: 81 mg/dL (ref 70–99)
Potassium: 4.1 mmol/L (ref 3.5–5.1)
Sodium: 131 mmol/L — ABNORMAL LOW (ref 135–145)
Total Bilirubin: 0.4 mg/dL (ref 0.3–1.2)
Total Protein: 7.3 g/dL (ref 6.5–8.1)

## 2021-10-10 LAB — CBC WITH DIFFERENTIAL (CANCER CENTER ONLY)
Abs Immature Granulocytes: 0.11 10*3/uL — ABNORMAL HIGH (ref 0.00–0.07)
Basophils Absolute: 0.1 10*3/uL (ref 0.0–0.1)
Basophils Relative: 1 %
Eosinophils Absolute: 0 10*3/uL (ref 0.0–0.5)
Eosinophils Relative: 0 %
HCT: 41.8 % (ref 39.0–52.0)
Hemoglobin: 14.4 g/dL (ref 13.0–17.0)
Immature Granulocytes: 1 %
Lymphocytes Relative: 17 %
Lymphs Abs: 1.9 10*3/uL (ref 0.7–4.0)
MCH: 32.7 pg (ref 26.0–34.0)
MCHC: 34.4 g/dL (ref 30.0–36.0)
MCV: 94.8 fL (ref 80.0–100.0)
Monocytes Absolute: 1 10*3/uL (ref 0.1–1.0)
Monocytes Relative: 9 %
Neutro Abs: 8 10*3/uL — ABNORMAL HIGH (ref 1.7–7.7)
Neutrophils Relative %: 72 %
Platelet Count: 203 10*3/uL (ref 150–400)
RBC: 4.41 MIL/uL (ref 4.22–5.81)
RDW: 13.7 % (ref 11.5–15.5)
WBC Count: 11.1 10*3/uL — ABNORMAL HIGH (ref 4.0–10.5)
nRBC: 0 % (ref 0.0–0.2)

## 2021-10-10 LAB — LACTATE DEHYDROGENASE: LDH: 304 U/L — ABNORMAL HIGH (ref 98–192)

## 2021-10-11 LAB — ACID FAST CULTURE WITH REFLEXED SENSITIVITIES (MYCOBACTERIA): Acid Fast Culture: NEGATIVE

## 2021-10-11 LAB — KAPPA/LAMBDA LIGHT CHAINS
Kappa free light chain: 23.3 mg/L — ABNORMAL HIGH (ref 3.3–19.4)
Kappa, lambda light chain ratio: 1.69 — ABNORMAL HIGH (ref 0.26–1.65)
Lambda free light chains: 13.8 mg/L (ref 5.7–26.3)

## 2021-10-15 ENCOUNTER — Encounter: Payer: Self-pay | Admitting: Internal Medicine

## 2021-10-15 ENCOUNTER — Encounter: Payer: Self-pay | Admitting: Hematology and Oncology

## 2021-10-15 NOTE — Progress Notes (Signed)
[facility] [phone]   [phone]  PROGRESS NOTE  Patient Care Team: Antony Contras, MD as PCP - General (Family Medicine)  Hematological/Oncological History # IgG Kappa Multiple Myeloma 02/02/2021:  Presented to [place] ED due to right sided flank tenderness with bruising. CT abdomen/pelvis: Multiple small lytic lesions in the thoracolumbar spine and bilateral pelvis -SPEP: IgG 2,082 (H), M Protein 1.8 (H). IFE shows IgG monoclonal protein with kappa light chain specificity.  -LDH 169, CBC normal, CMP normal except for sodium 131 (L), Chloride 96 (L).   02/14/2021: Establish care with Dede Query PA-C 02/22/2021: bone marrow biopsy confirms the diagnosis of Multiple Myeloma with a monoclonal plasma cell population.  03/16/2021: Cycle 1 Day 1 of VRd chemotherapy  04/06/2021: Cycle 2 Day 1 of VRd chemotherapy  04/27/2021: Cycle 3 Day 1 of VRd chemotherapy  05/18/2021: Cycle 4 Day 1 of VRd chemotherapy  06/01/2021: drop dexamethasone to 81m PO weekly and start lasix due to shortness of breath.  06/15/2021: Desaturation to 87% on ambulation. HELD velcade today and sent to ED for evaluation.  06/22/2021: Findings consistent with drug reaction the lungs with eosinophils on BAL.  Given these findings we will definitely hold [name] and plan to avoid pomalidomide 06/29/2021: Cycle 1 Day 1 CyBorD chemotherapy 07/05/2021: HOLD chemotherapy given worsening lung function  Interval History:  VKoleton DucheminTosco 65y.o. male with medical history significant for IgG Kappa multiple myeloma who presents for a follow up visit. The patient's last visit was on 08/31/2021.  In the interim since last visit he has continued  to see modest improvement in respiratory status.   On exam today Mr. Pines is accompanied by his wife.  He notes he is no longer requiring any home O2 in the setting about 95 to 97% on room air.  He continues to have a cough but he notes this is improving.  He is  doing his best to continue exercise, which while in the gym does improve his respiratory status.  He continues to have difficulty with walking, bending over, and lifting.  He notes that he is "frustrated" with the slow progress he has been making and [facility]  He notes that his sleep has been okay and has been quite wiped out and tired.  He does have PFTs scheduled for next week.  Given the patient's respiratory status we decided to hold on further treatments of chemotherapy until his respiratory status improved. He denies any fevers, chills, sweats, nausea, vomiting or diarrhea.  A full 10 point ROS is listed below.  MEDICAL HISTORY:  Past Medical History:  Diagnosis Date   Asthma    GERD (gastroesophageal reflux disease)    High cholesterol    under control.    History of blood transfusion    Hypothyroidism    Interstitial lung disease (HCC)    Multiple myeloma (HCC)    Perennial allergic rhinitis    Pneumonia    Sciatic pain, right    Seasonal allergic rhinitis    Thyroid disease     SURGICAL HISTORY: Past Surgical History:  Procedure Laterality Date   BRONCHIAL BIOPSY  06/18/2021   Procedure: BRONCHIAL BIOPSIES;  Surgeon: BCollene Gobble MD;  [facility];  Service: Cardiopulmonary;;   BRONCHIAL BIOPSY  08/28/2021   Procedure: BRONCHIAL BIOPSIES;  Surgeon: IGarner Nash DO;  [place] [facility]  Service: Cardiopulmonary;;   BRONCHIAL WASHINGS  06/18/2021   Procedure:  BRONCHIAL WASHINGS;  Surgeon: Collene Gobble, MD;  [facility];  Service: Cardiopulmonary;;   BRONCHIAL WASHINGS  08/28/2021   Procedure: BRONCHIAL WASHINGS;  Surgeon: Garner Nash, DO;  Location: [place];  Service: Cardiopulmonary;;   NASAL SINUS SURGERY     NECK SURGERY  1996   VIDEO BRONCHOSCOPY N/A 06/18/2021   Procedure: VIDEO BRONCHOSCOPY WITH FLUORO;  Surgeon: Collene Gobble, MD;  Location: [facility];  Service:  Cardiopulmonary;  Laterality: N/A;   VIDEO BRONCHOSCOPY N/A 08/28/2021   Procedure: VIDEO BRONCHOSCOPY WITH FLUORO;  Surgeon: Garner Nash, DO;  Location: [place];  Service: Cardiopulmonary;  Laterality: N/A;    SOCIAL HISTORY: Social History   Socioeconomic History   Marital status: Married    Spouse name: Not on file   Number of children: 3   Years of education: Not on file   Highest education level: Not on file  Occupational History   Not on file  Tobacco Use   Smoking status: Former    Packs/day: 0.10    Years: 3.00    Pack years: 0.30    Types: Cigarettes    Quit date: 10/21/1982    Years since quitting: 39.0   Smokeless tobacco: Never  Vaping Use   Vaping Use: Never used  Substance and Sexual Activity   Alcohol use: Not Currently    Comment: 1 drink daily   Drug use: No   Sexual activity: Not on file  Other Topics Concern   Not on file  Social History Narrative   Not on file   Social Determinants of Health   Financial Resource Strain: Not on file  Food Insecurity: Not on file  Transportation Needs: Not on file  Physical Activity: Not on file  Stress: Not on file  Social Connections: Not on file  Intimate Partner Violence: Not on file    FAMILY HISTORY: Family History  Problem Relation Age of Onset   Hypertension Mother    Allergies Mother    Allergies Father    CAD Father 37   Breast cancer Paternal Grandmother    Hypertension Other    Heart disease Other     ALLERGIES:  is allergic to clarithromycin and penicillins.  MEDICATIONS:  Current Outpatient Medications  Medication Sig Dispense Refill   acetaminophen (TYLENOL) 500 MG tablet Take 1,000 mg by mouth every 6 (six) hours as needed for mild pain, moderate pain or headache.     acyclovir (ZOVIRAX) 400 MG tablet Take 1 tablet (400 mg total) by mouth 2 (two) times daily. 60 tablet 3   albuterol (VENTOLIN HFA) 108 (90 Base) MCG/ACT inhaler Inhale 2 puffs into the lungs every 6 hours as  needed for wheezing or shortness of breath. 8.5 g 1   allopurinol (ZYLOPRIM) 300 MG tablet Take 300 mg by mouth daily.     ALPRAZolam (XANAX) 1 MG tablet Take 0.33 mg by mouth at bedtime.     aspirin EC 81 MG tablet Take 81 mg by mouth daily. Swallow whole. (Patient not taking: Reported on 10/10/2021)     Craig as directed.     bisoprolol (ZEBETA) 5 MG tablet Take 0.5 tablets (2.5 mg total) by mouth daily. 30 tablet 0   cholecalciferol (VITAMIN D) 1000 UNITS tablet Take 3,000 Units by mouth daily.     EPINEPHrine (EPI-PEN) 0.3 mg/0.3 mL DEVI Inject 0.3 mg into the muscle as needed.     ezetimibe-simvastatin (VYTORIN) 10-40 MG per tablet Take  1 tablet by mouth at bedtime.     fluticasone-salmeterol (ADVAIR HFA) 230-21 MCG/ACT inhaler Inhale 2 puffs into the lungs 2 (two) times daily. 1 each 12   furosemide (LASIX) 20 MG tablet Take 1 tablet (20 mg total) by mouth as needed for fluid or edema. 14 tablet 1   ketoconazole (NIZORAL) 2 % cream SMARTSIG:1 Application Topical 1 to 2 Times Daily     lenalidomide ([name]) 10 MG capsule TAKE 1 CAPSULE DAILY FOR 14 DAYS, NONE FOR 7 DAYS 14 capsule 0   levothyroxine (SYNTHROID) 150 MCG tablet Take 150 mcg by mouth every other day. Alternate     levothyroxine (SYNTHROID) 175 MCG tablet Take 1 tablet by mouth every other day.     montelukast (SINGULAIR) 10 MG tablet Take 10 mg by mouth at bedtime.     Multiple Vitamin (MULTIVITAMIN) tablet Take 1 tablet by mouth daily.     omeprazole (PRILOSEC) 40 MG capsule Take 1 capsule by mouth daily.     ondansetron (ZOFRAN) 8 MG tablet Take 1 tablet (8 mg total) by mouth every 8 (eight) hours as needed for nausea or vomiting. 30 tablet 0   OXYGEN Inhale 2 L into the lungs continuous.     predniSONE (DELTASONE) 10 MG tablet Take 2 tablets (20 mg total) by mouth daily with breakfast. 60 tablet 0   prochlorperazine (COMPAZINE) 10 MG tablet Take 1 tablet (10 mg total) by mouth every 6 (six)  hours as needed for nausea or vomiting. 30 tablet 0   sildenafil (REVATIO) 20 MG tablet Take 20 mg by mouth daily as needed (ED).     sodium chloride (OCEAN) 0.65 % SOLN nasal spray Place 1 spray into both nostrils as needed for congestion.     sulfamethoxazole-trimethoprim (BACTRIM DS) 800-160 MG tablet Take 1 tablet by mouth 3 (three) times a week. 38 tablet 3   tamsulosin (FLOMAX) 0.4 MG CAPS capsule Take 0.4 mg by mouth daily.     triamcinolone cream (KENALOG) 0.1 % Apply 1 application topically daily as needed (sun burn itch).     No current facility-administered medications for this visit.    REVIEW OF SYSTEMS:   Constitutional: ( - ) fevers, ( - )  chills , ( - ) night sweats Eyes: ( - ) blurriness of vision, ( - ) double vision, ( - ) watery eyes Ears, nose, mouth, throat, and face: ( - ) mucositis, ( - ) sore throat Respiratory: ( - ) cough, ( - ) dyspnea, ( - ) wheezes Cardiovascular: ( - ) palpitation, ( - ) chest discomfort, ( - ) lower extremity swelling Gastrointestinal:  ( - ) nausea, ( - ) heartburn, ( - ) change in bowel habits Skin: ( - ) abnormal skin rashes Lymphatics: ( - ) new lymphadenopathy, ( - ) easy bruising Neurological: ( - ) numbness, ( - ) tingling, ( - ) new weaknesses Behavioral/Psych: ( - ) mood change, ( - ) new changes  All other systems were reviewed with the patient and are negative.  PHYSICAL EXAMINATION: ECOG PERFORMANCE STATUS: 0 - Asymptomatic  Vitals:   10/10/21 1104  BP: (!) 143/96  Pulse: (!) 58  Resp: 17  Temp: (!) 97.4 F (36.3 C)  SpO2: 97%     Filed Weights   10/10/21 1104  Weight: 217 lb 11.2 oz (98.7 kg)    GENERAL: well appearing middle aged Caucasian male alert, no distress and comfortable SKIN: skin color, texture, turgor are normal, no  rashes or significant lesions EYES: conjunctiva are pink and non-injected, sclera clear LUNGS: Decreased airflow with no crackles or rails.  No wheezing noted. HEART: regular rate &  rhythm and no murmurs and no lower extremity edema Musculoskeletal: no cyanosis of digits and no clubbing  PSYCH: alert & oriented x 3, fluent speech NEURO: no focal motor/sensory deficits  LABORATORY DATA:  I have reviewed the data as listed CBC Latest Ref Rng & Units 10/10/2021 09/27/2021 08/31/2021  WBC 4.0 - 10.5 K/uL 11.1(H) 10.7(H) 9.3  Hemoglobin 13.0 - 17.0 g/dL 14.4 13.8 14.1  Hematocrit 39.0 - 52.0 % 41.8 41.7 41.1  Platelets 150 - 400 K/uL 203 240.0 171    CMP Latest Ref Rng & Units 10/10/2021 09/27/2021 08/31/2021  Glucose 70 - 99 mg/dL 81 81 100(H)  BUN 8 - 23 mg/dL _0 Creatinine 0.61 - 1.24 mg/dL 0.97 0.90 0.78  Sodium 135 - 145 mmol/L 131(L) 126(L) 127(L)  Potassium 3.5 - 5.1 mmol/L 4.1 4.1 3.9  Chloride 98 - 111 mmol/L 95(L) 92(L) 94(L)  CO2 22 - 32 mmol/L _1 Calcium 8.9 - 10.3 mg/dL 9.1 9.3 8.6(L)  Total Protein 6.5 - 8.1 g/dL 7.3 - 7.0  Total Bilirubin 0.3 - 1.2 mg/dL 0.4 - 0.4  Alkaline Phos 38 - 126 U/L 36(L) - 42  AST 15 - 41 U/L 20 - 19  ALT 0 - 44 U/L 23 - 26    Lab Results  Component Value Date   MPROTEIN Not Observed 08/31/2021   MPROTEIN Not Observed 08/03/2021   MPROTEIN Not Observed 06/29/2021   Lab Results  Component Value Date   KPAFRELGTCHN 23.3 (H) 10/10/2021   KPAFRELGTCHN 22.8 (H) 08/31/2021   KPAFRELGTCHN 28.7 (H) 08/03/2021   LAMBDASER 13.8 10/10/2021   LAMBDASER 10.8 08/31/2021   LAMBDASER 13.1 08/03/2021   KAPLAMBRATIO 1.69 (H) 10/10/2021   KAPLAMBRATIO 2.11 (H) 08/31/2021   KAPLAMBRATIO 2.19 (H) 08/03/2021    RADIOGRAPHIC STUDIES: I have personally reviewed the radiological images as listed and agreed with the findings in the report: lytic lesions in the hip bones bilaterally.  No results found.   ASSESSMENT & PLAN Amel Gianino Journey 65 y.o. male with medical history significant for IgG Kappa multiple myeloma who presents for a follow up visit.   After review of the labs, review of the records, and discussion  with the patient the findings are most consistent with an IgG kappa multiple myeloma.  The patient has 2 lytic lesions within the pelvic bones (more noted in spine on CT scan) as well as 10% plasma cells within the bone marrow biopsy.  This combined with his serological findings confirm the diagnosis of multiple myeloma.  The initial treatment of choice for this patient's multiple myeloma was VRd. This will consist of bortezomib 1.75m/m2 on days 1, 8, 15, dexamethasone 457mon days 1,8, and 15, and revlimid 2573mO daily days 1-14. Cycles will consist of 21 days. We will use this regimen to stabilize the patient's myeloma, then make a referral to a BMT center of his choosing for consideration of a bone marrow transplant once he has been noted to have a good response (VGPR or better). Zometa was started after dental clearance was obtained. This will be continued x 2 years.   Unfortunately had a drug reaction to [name] and was admitted to the hospital from 06/15/2021 until 06/18/2021.  The patient underwent a B AL which clearly showed evidence of eosinophils.  This is consistent  with a drug reaction most likely caused by the patient's [name].  Discussed the case with pulmonology who recommended that we not rechallenge for attempt other drugs in the same class such as pomalidomide.  Given the patient's excellent response to [facility] therapy might preference would be to continue that.  As such I would recommend we proceed with CyBorD chemotherapy.  Daratumumab-based therapy could have been considered, but is often paired with an immunologic.  Additionally I would like to preserve those for additional lines of therapy if necessary.  Therefore we will proceed with CyBorD chemotherapy have the patient referred to transplant.  R-IPSS score: Stage 2. PFS of 42 months  # IgG Kappa Multiple Myeloma (t11;14, standard risk)  --diagnostic criteria was met with monoclonal plasma cells in the bone marrow and lytic  lesions on the bone --recommend proceeding with VRd chemotherapy as noted above --patient is young and healthy enough for consideration of BMT, though he is borderline with his age. Will consider referral pending response to treatment.  -- Cycle 1 Day 1 started on 03/16/2021 --decreased dexamethasone to 15m PO weekly due to fluid overload. Also started lasix 223mPO (changed on 06/01/2021) --plan to HOLD revlimid moving forward after evidence of drug reaction in the lungs. --started CyBorD chemotherapy on 06/29/2021 --patient has reached a VGPR. [facility]  Plan: --will continue to HOLD chemotherapy at this time, allowing his respiratory status to recuperate and pulmonology to help optimize his breathing prior to rechallenging him --ILD is not improving on steroid therapy. Pulmonology performed bronchoscopy with biopsies. [facility]  --At the risk of worsening his lung function will continue to hold therapy.  --RTC 4 weeks to readdress.   #Drug Reaction in the Lung --confirmed with increased eosinophils on BAL during admission for shortness of breath --currently on a steroid taper, recently dose increased and restarted.  --will need to hold revlimid and and avoid the use of pomalidomide moving forward. Do no recommend rechallenge --patient will continue to follow up with pulmonology.   #Supportive Care -- chemotherapy education complete --zometa therapy started on 04/05/2021 (4 mg IV q 3 months), next dose Dec 2022 --ASA 8115mO daily for thromboprophylaxis on revlimid -- zofran 8mg23mH PRN and compazine 10mg75mq6H for nausea -- acyclovir 400mg 41mID for VCZ prophylaxis -- no pain medication required at this time.   No orders of the defined types were placed in this encounter.   All questions were answered. The patient knows to call the clinic with any problems, questions or concerns.  A  total of more than 30 minutes were spent on this encounter with face-to-face time and non-face-to-face time, including preparing to see the patient, ordering tests and/or medications, counseling the patient and coordination of care as outlined above.   Emmajo Bennette TLedell Peoplesepartment of Hematology/Oncology [facility]: [phone]: [phone]: [email or web address]  10/15/2021 11:09 AM

## 2021-10-16 ENCOUNTER — Ambulatory Visit (INDEPENDENT_AMBULATORY_CARE_PROVIDER_SITE_OTHER): Payer: Managed Care, Other (non HMO) | Admitting: Internal Medicine

## 2021-10-16 ENCOUNTER — Other Ambulatory Visit: Payer: Self-pay

## 2021-10-16 DIAGNOSIS — J849 Interstitial pulmonary disease, unspecified: Secondary | ICD-10-CM | POA: Diagnosis not present

## 2021-10-16 LAB — PULMONARY FUNCTION TEST
DL/VA % pred: 109 %
DL/VA: 4.53 ml/min/mmHg/L
DLCO cor % pred: 37 %
DLCO cor: 10.04 ml/min/mmHg
DLCO unc % pred: 37 %
DLCO unc: 9.99 ml/min/mmHg
FEF 25-75 Pre: 2.38 L/s
FEF2575-%Pred-Pre: 87 %
FEV1-%Pred-Pre: 36 %
FEV1-Pre: 1.24 L
FEV1FVC-%Pred-Pre: 123 %
FEV6-%Pred-Pre: 30 %
FEV6-Pre: 1.35 L
FEV6FVC-%Pred-Pre: 105 %
FVC-%Pred-Pre: 29 %
FVC-Pre: 1.35 L
Pre FEV1/FVC ratio: 92 %
Pre FEV6/FVC Ratio: 100 %

## 2021-10-16 NOTE — Progress Notes (Signed)
Spirometry and Dlco done today. 

## 2021-10-17 ENCOUNTER — Telehealth: Payer: Self-pay | Admitting: Internal Medicine

## 2021-10-17 ENCOUNTER — Telehealth (INDEPENDENT_AMBULATORY_CARE_PROVIDER_SITE_OTHER): Payer: Managed Care, Other (non HMO) | Admitting: Internal Medicine

## 2021-10-17 ENCOUNTER — Other Ambulatory Visit: Payer: Self-pay | Admitting: Internal Medicine

## 2021-10-17 DIAGNOSIS — Z7189 Other specified counseling: Secondary | ICD-10-CM | POA: Diagnosis not present

## 2021-10-17 DIAGNOSIS — J849 Interstitial pulmonary disease, unspecified: Secondary | ICD-10-CM

## 2021-10-17 DIAGNOSIS — Z7952 Long term (current) use of systemic steroids: Secondary | ICD-10-CM | POA: Diagnosis not present

## 2021-10-17 MED ORDER — BISOPROLOL FUMARATE 5 MG PO TABS: 5.0000 mg | ORAL_TABLET | Freq: Every day | ORAL | 2 refills | Status: DC

## 2021-10-17 MED ORDER — PREDNISONE 10 MG PO TABS: 15.0000 mg | ORAL_TABLET | Freq: Every day | ORAL | 2 refills | Status: DC

## 2021-10-17 NOTE — Telephone Encounter (Signed)
I went ahead and sent the prednisone and bisoprolol rxs to [facility]  I called the pt to make sure he understands that these have been sent- had to [facility]   Will forward to [facility] regarding the need for OFEV samples  Thanks!

## 2021-10-17 NOTE — Telephone Encounter (Signed)
Samuel [name] /TRiage - pls send refill on prednisone but we are dropping it to 15mg  per day. Also, send bisoprolol at 5mg  per day  [facility] - are there donor samples of ofev for him to start on?  Thanks     SIGNATURE    Dr. Brand Males, M.D., F.C.C.P,  Pulmonary and Critical Care Medicine Staff Physician, [facility] Director - Interstitial Lung Disease  Program  Pulmonary [place] at [place], [place], [address]  NPI Number:  NPI #[id number]  Pager: [phone], If no answer  -> Check AMION or Try [phone] Telephone (clinical office): [phone] Telephone (research): [phone]  3:45 PM 10/17/2021

## 2021-10-17 NOTE — Progress Notes (Addendum)
Type of visit: Video Circumstance: COVID-19 national emergency Identification of patient Samuel [name] with Feb 15, 1956 and MRN [id number] - 2 person identifier Risks: Risks, benefits, limitations of telephone visit explained. Patient understood and verbalized agreement to proceed Anyone else on call: Wife Patient location: His home This provider location: [facility] during my rotation    Subjective:   - feels he is beter. Feels people say he looks better and is less congested, less cough, less short of breath. Samuel [name] still has challenges with dyspnea on exertion. Needs to focus on diaphragm and moving arms to control doe. Able to do 20 min o recumbent bike without o2 at slow pace -> felt he maintained pusle ox 93-95%. Sometimes when he walks but will drop to < 88% but not consistently. Effort/time to drop below 88% is all improved.  - regarding myelmoma - saw Dr [name] 10/10/21. Waiting on M protein results. Currently holding revlmid and all chemo. Going to see [facility] BMT team. Appt pending   . Marland KitchenCurrently still on prednisone 20mg  per day (s/p recent solumedrol load). PFT yesterday shows 12.9% decline Oct 2022 -> dec 2022 . Samuel [name] does not feel he has declined and does not feel that the results of the  PFT are c/w clinical course where he feels better.    Has not started ofev as yet because Dr [name] has asked for clarification for pulmonary at [facility] about relationship of BMT to [facility]. Also starting medicare Oct 21, 2021 and has to wait for approval/start  He also wants to know end point on prednisone. 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    Reg   insurance  - RNA genomic classifier test was denied. Gave him contact via email  at [facility]  BP  - on bisoprolol . Morning is high  160-180sbp and then gets better. On 2.5mg  per day dose  PFT Results Latest Ref Rng & Units 10/16/2021 08/02/2021  FVC-Pre L 1.35 1.55   FVC-Predicted Pre % 29 33  FVC-Post L - 1.45  FVC-Predicted Post % - 31  Pre FEV1/FVC % % 92 83  Post FEV1/FCV % % - 83  FEV1-Pre L 1.24 1.28  FEV1-Predicted Pre % 36 37  FEV1-Post L - 1.20  DLCO uncorrected ml/min/mmHg 9.99 12.00  DLCO UNC% % 37 44  DLCO corrected ml/min/mmHg 10.04 12.52  DLCO COR %Predicted % 37 46  DLVA Predicted % 109 118  TLC L - 4.18  TLC % Predicted % - 59  RV % Predicted % - 95    A/P  Patient Instructions  ILD (interstitial lung disease) (HCC) Current chronic use of systemic steroids  - will classif you as ILD progressive since summer 2022 though with recent steroid you are better/stable - PFT worse Oct 2022 -> Dec 2022 though you feel better  Plan  - continue o2 with exertion as before  - still recommending starting ofev 150mg  twice daily as anti-fibrotic protective effect  - will send message to [facility] to get you some donor samples  - you can always stop if side effects or interference with future potential bone marrow transplant  - continue prednisone but drop from 20mg  to 15mg  per -continue bactrim 1DS tab three times weekly for infection prevention - at some point based on outcome with [facility] BMT team/Dr [name]  - can consider IV cytoxan for your ILD - discussion on surgical lung bx for lung neede but in conjunction  with Myeloma treatment   Hypertension  Plan  - increase bisoprolol to 5mg  per day and then work [employer] your PCP Samuel Contras, MD on this  Myeloma    - per Dr [name]  -- will talk to him ' Insurance coverage problems  - [employer] denied his RNA genomic classifer  Plan  - given informatio  Followup  - 3-4 weeks video or ideally face to face visit  Customer service issues  - sorry for snafus  Followup   -   - video visit late Dec 2022 or early Jan 2022 (ok to schedule when I Am in ICU block in the afternoon)    (Level 04: Estb 30-39 min   visit type: video virtual visit visit spent in total care time and  counseling or/and coordination of care by this undersigned MD - Dr Brand Males. This includes one or more of the following on this same day 10/17/2021: pre-charting, chart review, note writing, documentation discussion of test results, diagnostic or treatment recommendations, prognosis, risks and benefits of management options, instructions, education, compliance or risk-factor reduction. It excludes time spent by the [facility] or office staff in the care of the patient . Actual time is 31 min)

## 2021-10-17 NOTE — Patient Instructions (Addendum)
ILD (interstitial lung disease) ([place]) Current chronic use of systemic steroids  - will classif you as ILD progressive since summer 2022 though with recent steroid you are better/stable - PFT worse Oct 2022 -> Dec 2022 though you feel better  Plan  - continue o2 with exertion as before  - still recommending starting ofev 150mg  twice daily as anti-fibrotic protective effect  - will send message to [facility] to get you some donor samples  - you can always stop if side effects or interference with future potential bone marrow transplant  - continue prednisone but drop from 20mg  to 15mg  per -continue bactrim 1DS tab three times weekly for infection prevention - at some point based on outcome with [facility] BMT team/Dr [name]  - can consider IV cytoxan for your ILD - discussion on surgical lung bx for lung neede but in conjunction with Myeloma treatment   Hypertension  Plan  - increase bisoprolol to 5mg  per day and then work [employer] your PCP Antony Contras, MD on this  Myeloma    - per Dr [name]  -- will talk to him ' Insurance coverage problems  - [employer] denied his RNA genomic classifer  Plan  - given informatio  Followup  - 3-4 weeks video or ideally face to face visit  Customer service issues  - sorry for snafus  Followup   -   - video visit late Dec 2022 or early Jan 2022 (ok to schedule when I Am in ICU block in the afternoon)

## 2021-10-18 ENCOUNTER — Telehealth: Payer: Self-pay | Admitting: *Deleted

## 2021-10-18 ENCOUNTER — Telehealth: Payer: Self-pay | Admitting: Internal Medicine

## 2021-10-18 LAB — MULTIPLE MYELOMA PANEL, SERUM
Albumin SerPl Elph-Mcnc: 3.8 g/dL (ref 2.9–4.4)
Albumin/Glob SerPl: 1.3 (ref 0.7–1.7)
Alpha 1: 0.2 g/dL (ref 0.0–0.4)
Alpha2 Glob SerPl Elph-Mcnc: 0.8 g/dL (ref 0.4–1.0)
B-Globulin SerPl Elph-Mcnc: 1 g/dL (ref 0.7–1.3)
Gamma Glob SerPl Elph-Mcnc: 1.1 g/dL (ref 0.4–1.8)
Globulin, Total: 3.1 g/dL (ref 2.2–3.9)
IgA: 231 mg/dL (ref 61–437)
IgG (Immunoglobin G), Serum: 1172 mg/dL (ref 603–1613)
IgM (Immunoglobulin M), Srm: 71 mg/dL (ref 20–172)
Total Protein ELP: 6.9 g/dL (ref 6.0–8.5)

## 2021-10-18 NOTE — Telephone Encounter (Signed)
Received call from pt inquiring about his pulmonary referral @ [place] that I had spoken to Urban Gibson, out patient bone marrow transplant coordinator, last week. She told me that she would make the referral as it may speed up the process.  Provided # to [place] Guiles to patient and he will touch base with her. Pt voiced understanding.

## 2021-10-18 NOTE — Telephone Encounter (Signed)
Called and spoke with pt letting him know that we did send refill of both prednisone and bisoprolol to the pharmacy for him and he verbalized understanding.  Stated to him the info from [place] about the donor [place] sample.  Pt said that he is not fully sure if he wants to go on the medication as due to his household income, he knows that he will not be able to be approved any financial assistance due to the total household income for him and his spouse.  Pt also stated that he was told by [facility], pharmacist that his cost before checking to see if he would be able to get any help from insurance would around $14,000 a year.  In regards to the donor OFEV, pt asked if he took it home and decided that he didn't want to use it could he bring it back and also asked if he started to use it and then decided that he no longer wanted to be on the medication, could that also work.  Spoke with [place], pharmacy tech about this and what was decided was that pt needs to bring his new Medicare Part D insurance card by the office so Arvilla Market could get a copy of it to be able to run with a PA so we could see what his cost would be after insurance. Based off of that, if the cost was reasonable for pt that he would decide to go on the OFEV based off of that, then we could let him come get the samples of the OFEV if he decided that he would want to go on the medication.  Pt said he would bring his card by the office either this afternoon 12/29 or tomorrow morning 12/30 so we could get a copy of it and then go from there with the determination if he wants to go on the medication or not.

## 2021-10-18 NOTE — Telephone Encounter (Signed)
Devki is currently out of the office until 10/22/2021.   In regards to Ofev samples, we only have one bottle remaining in the 150mg  strength--this was previously a Donor bottle and thus has previously been opened. Only #48 out of the initial #60 capsules remain, the expiration date on the bottle is 08/2022. We can set these aside for the patient if this is acceptable.  In order to get Patient Assistance through [facility] for the remainder of 2023 we will need copies of his new Medicare Part D card and income documents (such as 1040, W-2, paycheck stubs, social security benefits statements, etc.) for all financially contributing members of the household. Pt will also need to fill out the patient portion of the [facility] Cares application as this was not done at his most recent appointment. Information needed to complete application is total household income as well as value of any household assets in addition to primary home and vehicles (401k, IRA, second home, etc.) if applicable. We will also need to know if the patient has received disability payments for more than 24 months, if he has previously applied for and been denied [place] (if yes we will need the denial letter), and whether or not the pt receives benefits from the [place].  I will draft the paperwork and the pt can fill it out if/when he comes to pick up the samples.

## 2021-10-18 NOTE — Telephone Encounter (Signed)
Routing to [place] so she can get pt added.

## 2021-10-18 NOTE — Telephone Encounter (Signed)
Can you pleas set a telephone/video visit 10/19/21 afternoon with him please. I am in ICU but need to communicate my conversation with Dr Demetria Pore. Telephone fine/easier/preferred. I just did video yesterday. Let him know I will call him sometime in afternoon/evening when I have downtime

## 2021-10-19 ENCOUNTER — Encounter: Payer: Self-pay | Admitting: Internal Medicine

## 2021-10-19 ENCOUNTER — Telehealth (INDEPENDENT_AMBULATORY_CARE_PROVIDER_SITE_OTHER): Payer: Managed Care, Other (non HMO) | Admitting: Internal Medicine

## 2021-10-19 ENCOUNTER — Encounter: Payer: Self-pay | Admitting: Hematology and Oncology

## 2021-10-19 ENCOUNTER — Other Ambulatory Visit: Payer: Self-pay

## 2021-10-19 ENCOUNTER — Telehealth: Payer: Self-pay | Admitting: *Deleted

## 2021-10-19 ENCOUNTER — Other Ambulatory Visit (HOSPITAL_COMMUNITY): Payer: Self-pay

## 2021-10-19 DIAGNOSIS — J849 Interstitial pulmonary disease, unspecified: Secondary | ICD-10-CM | POA: Diagnosis not present

## 2021-10-19 NOTE — Progress Notes (Addendum)
Type of visit: Video Circumstance: COVID-19 national emergency Identification of patient Samuel [name] with September 13, 1956 and MRN [id number] - 2 person identifier Risks: Risks, benefits, limitations of telephone visit explained. Patient understood and verbalized agreement to proceed Anyone else on call: wife Patient location: his home This provider location: [facility] X[facility]   S: call to give update on conversation with Samuel Samuel [name] his hematologist  1. Myeloma - latest dec 2022 blood work back - still under remission. Samuel Samuel [name] indicated that highly unlikely he will be a BMT Candidate for myeloma if his lung. Has appt pending with [facility]. If not a BMT candidate - then cytoxan regimen short term would be used (indicated that is of ptioential benefit to lungs)  2. 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 Would need SLB to ID etiology preciesly but with myelom and Thereapuetic trial with steroids  and his presentation - this has not been a consideratgion till now.  Explained to Samuel Samuel [name] if ILD progresses futher - patient life expectancy is limited. Discussed with patient again and he is agreeable for prednisone 15mg  per day and starting ofev (awaiting donor samples and insurance proces in 2023).   3. WE discussed that probably best to refer to [facility] transplant and hematology to see if lung transplant would be an option at all if he were to decline esp in setting of myeloma. Maybe a BMT as well . Do not know answers but wil email Samuel [name] at [facility] to get patient in for visit. He might well need a surgical lung biopsy but this can be addressed in due course  Patient and wife agreeable  I spent time emailing Samuel [name] trasnplant doc  at [facility] - later heard from Samuel [name]- feels that Myleoma will need to be in remssion for >= 5 years before he can be considered lung transplant evaluable. They feel no need to see Samuel [name] in transplant clinicl. I subsequetly d/w Samuel [name] - will revert back to holding off lung transplant evaluation. He wil proceed with ofev. He will see [facility] BMT team. Will cosndier a [facility] ILD clinic opinion after d;w him   ( Level 05 visit: Estb 40-54 min in  visit type: video visit  in total care time and counseling or/and coordination of care by this undersigned MD - Samuel Brand Males. This includes one or more of the following on this same day 10/19/2021: pre-charting, chart review, note writing, documentation discussion of test results, diagnostic or treatment recommendations, prognosis, risks and benefits of management options, instructions, education, compliance or risk-factor reduction. It excludes time spent by the [place] or office staff in the care of the patient. Actual time 40 min)

## 2021-10-19 NOTE — Patient Instructions (Signed)
ICD-10-CM   1. 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

## 2021-10-19 NOTE — Telephone Encounter (Signed)
Patient scheduled for video visit for today, 12/30 at 4pm. Nothing further needed

## 2021-10-19 NOTE — Telephone Encounter (Signed)
Call made to Urban Gibson RN at [facility] regarding pt's pulmonary appt for 2nd opinion. This appt is scheduled for 27, 2023 with Dr. Kathi Ludwig. Samuel [name] also has f/u appt with Dr. Feliciana Rossetti in March 2023

## 2021-10-19 NOTE — Telephone Encounter (Signed)
New plan info received and added to [facility] Per test claim coverage is not active until 10/21/2021. Will run new test claim after activation date and will submit PA as needed.  Per income amount stated by pt, he is well over the income threshold and will not qualify for PAP. Monthly copay after insurance coverage will be major factor in proceeding with therapy.

## 2021-10-19 NOTE — Telephone Encounter (Signed)
New plan info obtained, test claim shows that coverage is not officially active until 10/21/2021. Will run new test claim after activation date and then submit PA if required. Will provide update once monthly copay after insurance payment is determined.

## 2021-10-19 NOTE — Telephone Encounter (Signed)
Hey all  He is intereste din the partial open ofev donor sample He wants to take ofev via script if he can afford it Triage - please refer him to [facility] lung transplant (pulm fibrosis with cimment - dR Norberta Stobaugh has emailed Dr Serita Grit) and also [place] hematology for multiple myeloma Give him face to face appt in 4 weeks - 68 min visit    SIGNATURE    Dr. Brand Males, M.D., F.C.C.P,  Pulmonary and Critical Care Medicine Staff Physician, [place] Director - Interstitial Lung Disease  Program  Pulmonary [place] [facility] [place], [place], [address]  NPI Number:  NPI #[id number]  Pager: [phone], If no answer  -> Check AMION or Try [phone] Telephone (clinical office): [phone] Telephone (research): [phone]  4:47 PM 10/19/2021

## 2021-10-23 ENCOUNTER — Other Ambulatory Visit (HOSPITAL_COMMUNITY): Payer: Self-pay

## 2021-10-23 NOTE — Telephone Encounter (Signed)
Submitted a Prior Authorization request to [facility] for OFEV via CoverMyMeds. Will update once we receive a response.   Key: [id number]

## 2021-10-23 NOTE — Telephone Encounter (Signed)
Called and spoke with pt letting him know the info stated by [facility].  Pt said that he will need to think about it if he does want to go ahead and fully begin the process with scheduling a shipment of the medication due to what his copay would be, but pt said that he does want to try the sample of the OFEV that we have to see how he does on the medication as he said if he does begin to have any side effects to the medication, then he will know right away that he will not want to further pursue taking the OFEV.  Routing this back to [place] about the sample.

## 2021-10-23 NOTE — Telephone Encounter (Signed)
New insurance coverage for 2023 is active and PA approval for Ofev has been received. Reminder that income level reported by pt will disqualify him from receiving PAP through [facility]. Benefits Investigation findings have been listed below with as much detail as possible due to lack of options for additional cost assistance.  Test claim revealed that insurance covers [id number], leaving patient with an initial copay of $3,228.11 for the first fill.Just one fill of this medication appears to push pt through [facility] and into Catastrophic Coverage.  [phone] of total copay is attributed to the Coverage Gap while listed coinsurance amount is $1,685.43, which is presumably the monthly copay for each subsequent fill after the first.   Please note that per previous note pt seems to have misunderstood the initial price quote and was under the impression that Ofev was $14k per year, however the actual cost would be $14k PER MONTH when billed for the cash price of the medication (without any insurance coverage).  If pt is agreeable with copay amount totaling to [phone] per year then we can provide sample for trial of medication if that is still desired.   It is worth mentioning that, due to pt having insurance coverage with a [facility] plan, copay could be slightly cheaper while filling through a [facility] specialty pharmacy. However, this medication may not be eligible for savings due to being a Limited Distribution drug. Regardless, pt should expect the above mentioned medication costs.

## 2021-10-23 NOTE — Telephone Encounter (Addendum)
Received notification from [facility] regarding a prior authorization for Samuel [name]. Authorization has been APPROVED from 10/21/2021 to 10/23/2022. Approval letter sent to scan center.  Per test claim, copay for 30 days supply is $3,228.11  Patient can fill through [place]: [phone]   Authorization # [id number]

## 2021-10-24 ENCOUNTER — Other Ambulatory Visit (HOSPITAL_COMMUNITY): Payer: Self-pay

## 2021-10-24 NOTE — Telephone Encounter (Signed)
Patient returned called - we reviewed that donor bottle is open and it is up to his discretion to start with that.  He spoke with Arvilla Market directly regarding cost that is detailed in prior note of his.  Patient's wife will be stopping by clinic tomorrow to pick up donor supply as well as a print out detailing first month's copay. We reviewed with that patient that price cannot be quoted for the entire year since there are formulary changes/cost sharing changes during the year potentially.  Knox Saliva, PharmD, MPH, BCPS Clinical Pharmacist (Rheumatology and Pulmonology)

## 2021-10-24 NOTE — Telephone Encounter (Signed)
Left VM with patient to discuss Ofev sample. Unable to reach - left VM with direct office number requesting return call  Knox Saliva, PharmD, MPH, BCPS Clinical Pharmacist (Rheumatology and Pulmonology)

## 2021-10-25 NOTE — Telephone Encounter (Signed)
Met with pt's wife today and provided sample medication and billing information from test claims. Wife would like [place] to proceed with obtaining a price inquiry with [facility] to see if there is any additional reduction in price. Also discussed that all future conversations go through her, as her husband is still trying to work despite conditions.  Samuel [name] (did not correct me when I previously addressed her as "Mrs. Phommachanh") mobile# [phone]  Stated that I would send my direct office phone number through [place] if pt had any additional questions while reviewing paperwork provided today.   Will proceed to send Rx to [facility] for price inquiry.

## 2021-10-25 NOTE — Telephone Encounter (Signed)
Rx for Ofev 150mg  tw[facility] [place] to follow-up with pharmacy regarding copay  Routing to [place], for f/u  Knox Saliva, PharmD, MPH, BCPS Clinical Pharmacist (Rheumatology and Pulmonology)

## 2021-10-26 MED ORDER — OFEV 150 MG PO CAPS: 150.0000 mg | ORAL_CAPSULE | Freq: Two times a day (BID) | ORAL | 3 refills | Status: DC

## 2021-10-26 NOTE — Telephone Encounter (Signed)
Reached out to [facility], rep informs me that she cannot discuss copay information with anyone but the patient. Verified that they had correct insurance info on file.  Followed up with pt's wife, and discussed findings and provide pharmacy phone number. Pt stated she was out walking her dogs and asked if I could text it to her. Contact info sent, requested pt follow up with [place] after she has obtained information. Will await return call.

## 2021-11-02 ENCOUNTER — Telehealth: Payer: Self-pay | Admitting: Hematology and Oncology

## 2021-11-02 NOTE — Telephone Encounter (Signed)
Scheduled per 1/12 rn msg, message has been left with pt

## 2021-11-05 ENCOUNTER — Encounter: Payer: Self-pay | Admitting: Hematology and Oncology

## 2021-11-06 ENCOUNTER — Telehealth: Payer: Self-pay | Admitting: Hematology and Oncology

## 2021-11-06 ENCOUNTER — Other Ambulatory Visit: Payer: Self-pay | Admitting: Hematology and Oncology

## 2021-11-06 NOTE — Telephone Encounter (Signed)
Sch per 1/16 inbasket, pt aware °

## 2021-11-12 ENCOUNTER — Other Ambulatory Visit: Payer: Self-pay

## 2021-11-12 ENCOUNTER — Inpatient Hospital Stay: Payer: Medicare Other | Attending: Hematology and Oncology

## 2021-11-12 ENCOUNTER — Ambulatory Visit: Payer: Managed Care, Other (non HMO) | Admitting: Hematology and Oncology

## 2021-11-12 ENCOUNTER — Other Ambulatory Visit: Payer: Managed Care, Other (non HMO)

## 2021-11-12 DIAGNOSIS — C9000 Multiple myeloma not having achieved remission: Secondary | ICD-10-CM | POA: Insufficient documentation

## 2021-11-12 LAB — CBC WITH DIFFERENTIAL (CANCER CENTER ONLY)
Abs Immature Granulocytes: 0.05 10*3/uL (ref 0.00–0.07)
Basophils Absolute: 0 10*3/uL (ref 0.0–0.1)
Basophils Relative: 0 %
Eosinophils Absolute: 0 10*3/uL (ref 0.0–0.5)
Eosinophils Relative: 0 %
HCT: 40.1 % (ref 39.0–52.0)
Hemoglobin: 14.3 g/dL (ref 13.0–17.0)
Immature Granulocytes: 0 %
Lymphocytes Relative: 8 %
Lymphs Abs: 0.9 10*3/uL (ref 0.7–4.0)
MCH: 33.3 pg (ref 26.0–34.0)
MCHC: 35.7 g/dL (ref 30.0–36.0)
MCV: 93.5 fL (ref 80.0–100.0)
Monocytes Absolute: 0.6 10*3/uL (ref 0.1–1.0)
Monocytes Relative: 5 %
Neutro Abs: 10.1 10*3/uL — ABNORMAL HIGH (ref 1.7–7.7)
Neutrophils Relative %: 87 %
Platelet Count: 205 10*3/uL (ref 150–400)
RBC: 4.29 MIL/uL (ref 4.22–5.81)
RDW: 13.2 % (ref 11.5–15.5)
WBC Count: 11.7 10*3/uL — ABNORMAL HIGH (ref 4.0–10.5)
nRBC: 0 % (ref 0.0–0.2)

## 2021-11-12 LAB — CMP (CANCER CENTER ONLY)
ALT: 30 U/L (ref 0–44)
AST: 33 U/L (ref 15–41)
Albumin: 4.3 g/dL (ref 3.5–5.0)
Alkaline Phosphatase: 34 U/L — ABNORMAL LOW (ref 38–126)
Anion gap: 8 (ref 5–15)
BUN: 15 mg/dL (ref 8–23)
CO2: 28 mmol/L (ref 22–32)
Calcium: 9.1 mg/dL (ref 8.9–10.3)
Chloride: 93 mmol/L — ABNORMAL LOW (ref 98–111)
Creatinine: 0.82 mg/dL (ref 0.61–1.24)
GFR, Estimated: >60 mL/min (ref >60)
Glucose, Bld: 103 mg/dL — ABNORMAL HIGH (ref 70–99)
Potassium: 4.3 mmol/L (ref 3.5–5.1)
Sodium: 129 mmol/L — ABNORMAL LOW (ref 135–145)
Total Bilirubin: 0.7 mg/dL (ref 0.3–1.2)
Total Protein: 7.6 g/dL (ref 6.5–8.1)

## 2021-11-12 LAB — LACTATE DEHYDROGENASE: LDH: 274 U/L — ABNORMAL HIGH (ref 98–192)

## 2021-11-13 ENCOUNTER — Encounter: Payer: Self-pay | Admitting: Hematology and Oncology

## 2021-11-13 LAB — KAPPA/LAMBDA LIGHT CHAINS
Kappa free light chain: 23.7 mg/L — ABNORMAL HIGH (ref 3.3–19.4)
Kappa, lambda light chain ratio: 1.74 — ABNORMAL HIGH (ref 0.26–1.65)
Lambda free light chains: 13.6 mg/L (ref 5.7–26.3)

## 2021-11-13 NOTE — Telephone Encounter (Signed)
Received fax from [facility] stating that they have been unsuccessful in contacting the pt to schedule a refill. I reached out and spoke to the pt's wife who had of course stated that they had only wanted to received a quote of the medication's price prior to proceeding (medication price was similar enough to what we had initially quoted them) and they had ultimately decided that they would prefer to deal with [place] than with them.  I informed her that the medication is a limited distribution drug that our pharmacy is unable to get it at this time, unfortunately going through [facility] would likely be their best option. Explained the overall process of requesting and receiving the refills and advised her to reach out to [place] here at the clinic if there was ever any billing issues. Also recommended that she have the patient call and list her as an authorized contact person and have them change the phone number on file to hers. She agreed with this, further stating that she would try and have the pt add me to the list of authorized people as well if possible.  At this time patient has about a week's worth left of the sample provided to him (out of the initial #48 caps) and has not developed any adverse reactions at this time.

## 2021-11-14 ENCOUNTER — Encounter: Payer: Self-pay | Admitting: Hematology and Oncology

## 2021-11-14 LAB — MULTIPLE MYELOMA PANEL, SERUM
Albumin SerPl Elph-Mcnc: 3.9 g/dL (ref 2.9–4.4)
Albumin/Glob SerPl: 1.2 (ref 0.7–1.7)
Alpha 1: 0.2 g/dL (ref 0.0–0.4)
Alpha2 Glob SerPl Elph-Mcnc: 0.8 g/dL (ref 0.4–1.0)
B-Globulin SerPl Elph-Mcnc: 1 g/dL (ref 0.7–1.3)
Gamma Glob SerPl Elph-Mcnc: 1.3 g/dL (ref 0.4–1.8)
Globulin, Total: 3.4 g/dL (ref 2.2–3.9)
IgA: 227 mg/dL (ref 61–437)
IgG (Immunoglobin G), Serum: 1245 mg/dL (ref 603–1613)
IgM (Immunoglobulin M), Srm: 67 mg/dL (ref 20–172)
Total Protein ELP: 7.3 g/dL (ref 6.0–8.5)

## 2021-11-15 ENCOUNTER — Telehealth: Payer: Self-pay | Admitting: *Deleted

## 2021-11-15 NOTE — Telephone Encounter (Signed)
[name] patient regarding recent lab results.  No answer but was able to leave vm message for pt advising that his lab results were back and his M protein remains undetectable and his serum free light chains are unchanged. Advised to keep [place] updated on his appts @ [facility]/and [place] clinics

## 2021-11-15 NOTE — Telephone Encounter (Signed)
-----   Message from Orson Slick, MD sent at 11/15/2021  7:41 AM EST ----- Please let Samuel [name] know that his M protein remains undetectable and his serum free light chains are unchanged. Please have him keep [place] posted on his progress at [facility].   ----- Message ----- From: Buel Ream, Lab In [place] Sent: 11/12/2021  10:26 AM EST To: Orson Slick, MD

## 2021-11-26 ENCOUNTER — Ambulatory Visit: Payer: Managed Care, Other (non HMO) | Admitting: Hematology and Oncology

## 2021-11-26 ENCOUNTER — Other Ambulatory Visit: Payer: Managed Care, Other (non HMO)

## 2021-11-27 ENCOUNTER — Telehealth: Payer: Self-pay | Admitting: Internal Medicine

## 2021-11-27 ENCOUNTER — Other Ambulatory Visit: Payer: Self-pay | Admitting: Internal Medicine

## 2021-11-27 NOTE — Telephone Encounter (Signed)
Called and spoke with Samuel [name]. Samuel [name] stated for the last 3-4 days has had rust-colored stool but then states that he had some diarrhea yesterday 2/6 and also today 2/7 and states that there was blood in it. Also states that he had a normal bowel movement today 2/7 which was rust-colored but then had another one after that that was bright red in color.  Samuel [name] thinks that this could be coming from the [facility].  Asked Samuel [name] if he had any hemorrhoids and he stated not that he knows of.  Samuel [name] denies any real complaints of abdominal pain, no nausea, no vomiting.  Routing to MR for advice.

## 2021-11-27 NOTE — Telephone Encounter (Signed)
°  Stop ofev   If symptoms get worse - call back or go to ER

## 2021-11-27 NOTE — Telephone Encounter (Signed)
Called and spoke with patient. He stated that since he called earlier, the blood has gotten worse. He is seeing more blood than feces at this point. He wanted to know if he needed to take his Ofev tonight. I advised him for now to prepare to skip the dose tonight until he hears back from [place].   MR, can you please advise? Thanks!

## 2021-11-27 NOTE — Telephone Encounter (Signed)
Called and spoke with patient. He is aware to stop the Ofev. He will call us back with an update.   Nothing further needed at time of call.

## 2021-11-27 NOTE — Telephone Encounter (Signed)
Diarrhea can be because of the nintedanib.  Rust colored school is typically hemorrhoids as indicated but sometimes nintedanib can make diverticulitis worse in which case patients usually have abdominal pain.  Plan - He can watch the situation over the next day or 2 but if the diarrhea persists or even if it is concerning right now it is probably best to just take a holiday from the nintedanib for a week and then restart at 1 tablet once daily for 1 week and then go to 1 tablet twice daily maintenance  I am seeing him 12/04/2021.  We will walk him and do a symptom score at this visit.  However, if he can get a spirometry and DLCO at that visit it will be great but if not no issue   PFT Results Latest Ref Rng & Units 10/16/2021 06/15/2021  FVC-Pre L 1.35 1.55  FVC-Predicted Pre % 29 33  FVC-Post L - 1.45  FVC-Predicted Post % - 31  Pre FEV1/FVC % % 92 83  Post FEV1/FCV % % - 83  FEV1-Pre L 1.24 1.28  FEV1-Predicted Pre % 36 37  FEV1-Post L - 1.20  DLCO uncorrected ml/min/mmHg 9.99 12.00  DLCO UNC% % 37 44  DLCO corrected ml/min/mmHg 10.04 12.52  DLCO COR %Predicted % 37 46  DLVA Predicted % 109 118  TLC L - 4.18  TLC % Predicted % - 59  RV % Predicted % - 95

## 2021-11-30 ENCOUNTER — Inpatient Hospital Stay (HOSPITAL_BASED_OUTPATIENT_CLINIC_OR_DEPARTMENT_OTHER): Payer: Medicare Other | Admitting: Hematology and Oncology

## 2021-11-30 ENCOUNTER — Inpatient Hospital Stay: Payer: Medicare Other | Attending: Hematology and Oncology

## 2021-11-30 ENCOUNTER — Other Ambulatory Visit: Payer: Self-pay

## 2021-11-30 VITALS — BP 135/87 | HR 72 | Temp 97.6°F | Resp 17 | Ht 70.0 in | Wt 222.0 lb

## 2021-11-30 DIAGNOSIS — Z7961 Long term (current) use of immunomodulator: Secondary | ICD-10-CM | POA: Diagnosis not present

## 2021-11-30 DIAGNOSIS — Z7982 Long term (current) use of aspirin: Secondary | ICD-10-CM | POA: Diagnosis not present

## 2021-11-30 DIAGNOSIS — C9000 Multiple myeloma not having achieved remission: Secondary | ICD-10-CM

## 2021-11-30 DIAGNOSIS — R0602 Shortness of breath: Secondary | ICD-10-CM

## 2021-11-30 DIAGNOSIS — J849 Interstitial pulmonary disease, unspecified: Secondary | ICD-10-CM | POA: Diagnosis not present

## 2021-11-30 DIAGNOSIS — Z79899 Other long term (current) drug therapy: Secondary | ICD-10-CM | POA: Diagnosis not present

## 2021-11-30 DIAGNOSIS — Z7952 Long term (current) use of systemic steroids: Secondary | ICD-10-CM | POA: Diagnosis not present

## 2021-11-30 DIAGNOSIS — Z87891 Personal history of nicotine dependence: Secondary | ICD-10-CM | POA: Insufficient documentation

## 2021-11-30 LAB — CBC WITH DIFFERENTIAL (CANCER CENTER ONLY)
Abs Immature Granulocytes: 0.06 10*3/uL (ref 0.00–0.07)
Basophils Absolute: 0 10*3/uL (ref 0.0–0.1)
Basophils Relative: 0 %
Eosinophils Absolute: 0 10*3/uL (ref 0.0–0.5)
Eosinophils Relative: 0 %
HCT: 36.6 % — ABNORMAL LOW (ref 39.0–52.0)
Hemoglobin: 12.6 g/dL — ABNORMAL LOW (ref 13.0–17.0)
Immature Granulocytes: 1 %
Lymphocytes Relative: 6 %
Lymphs Abs: 0.7 10*3/uL (ref 0.7–4.0)
MCH: 33.3 pg (ref 26.0–34.0)
MCHC: 34.4 g/dL (ref 30.0–36.0)
MCV: 96.8 fL (ref 80.0–100.0)
Monocytes Absolute: 0.5 10*3/uL (ref 0.1–1.0)
Monocytes Relative: 4 %
Neutro Abs: 11.1 10*3/uL — ABNORMAL HIGH (ref 1.7–7.7)
Neutrophils Relative %: 89 %
Platelet Count: 198 10*3/uL (ref 150–400)
RBC: 3.78 MIL/uL — ABNORMAL LOW (ref 4.22–5.81)
RDW: 13.4 % (ref 11.5–15.5)
WBC Count: 12.5 10*3/uL — ABNORMAL HIGH (ref 4.0–10.5)
nRBC: 0 % (ref 0.0–0.2)

## 2021-11-30 LAB — CMP (CANCER CENTER ONLY)
ALT: 23 U/L (ref 0–44)
AST: 23 U/L (ref 15–41)
Albumin: 4.1 g/dL (ref 3.5–5.0)
Alkaline Phosphatase: 31 U/L — ABNORMAL LOW (ref 38–126)
Anion gap: 5 (ref 5–15)
BUN: 16 mg/dL (ref 8–23)
CO2: 29 mmol/L (ref 22–32)
Calcium: 9.1 mg/dL (ref 8.9–10.3)
Chloride: 96 mmol/L — ABNORMAL LOW (ref 98–111)
Creatinine: 0.94 mg/dL (ref 0.61–1.24)
GFR, Estimated: >60 mL/min (ref >60)
Glucose, Bld: 96 mg/dL (ref 70–99)
Potassium: 4.5 mmol/L (ref 3.5–5.1)
Sodium: 130 mmol/L — ABNORMAL LOW (ref 135–145)
Total Bilirubin: 0.4 mg/dL (ref 0.3–1.2)
Total Protein: 7.2 g/dL (ref 6.5–8.1)

## 2021-11-30 LAB — LACTATE DEHYDROGENASE: LDH: 270 U/L — ABNORMAL HIGH (ref 98–192)

## 2021-12-04 ENCOUNTER — Encounter: Payer: Self-pay | Admitting: Hematology and Oncology

## 2021-12-04 ENCOUNTER — Telehealth: Payer: Self-pay | Admitting: Internal Medicine

## 2021-12-04 ENCOUNTER — Telehealth: Payer: Self-pay | Admitting: Pharmacist

## 2021-12-04 ENCOUNTER — Other Ambulatory Visit: Payer: Self-pay

## 2021-12-04 ENCOUNTER — Ambulatory Visit (INDEPENDENT_AMBULATORY_CARE_PROVIDER_SITE_OTHER): Payer: Medicare Other | Admitting: Internal Medicine

## 2021-12-04 ENCOUNTER — Encounter: Payer: Self-pay | Admitting: Internal Medicine

## 2021-12-04 VITALS — BP 120/68 | HR 57 | Temp 98.0°F | Ht 70.0 in | Wt 223.8 lb

## 2021-12-04 DIAGNOSIS — J849 Interstitial pulmonary disease, unspecified: Secondary | ICD-10-CM | POA: Diagnosis not present

## 2021-12-04 DIAGNOSIS — J9611 Chronic respiratory failure with hypoxia: Secondary | ICD-10-CM

## 2021-12-04 NOTE — Telephone Encounter (Signed)
Routing to myself to handle

## 2021-12-04 NOTE — Patient Instructions (Addendum)
ILD (interstitial lung disease) ([place]) Current chronic use of systemic steroids  - will classif you as ILD progressive since summer 2022 though with recent steroid you are better/stable - PFT worse Oct 2022 -> Dec 2022 though you feel better  - ofev caused lot of side effect; diarrhea and GI bleeding  Plan - get spirometry and dlco next few weeks  - visit with Devki to discuss pirfenidone  - continue o2 with exertion as before  -we will do order for 10L concentrator to help you exercise more  - goal pulse ox > 90%  -no more ofev - list as allergy  - continue prednisone but drop from 15mg  to 10mg  per your request -continue bactrim 1DS tab three times weekly for infection prevention - will refer to [facility] - but will talk to my contacts Dr Judie Grieve or Dr Gordy Levan at [facility] - at some point based on outcome with [facility] BMT team/Dr [name]  - can consider IV cytoxan for your ILD but understand right now Pulmonary at [facility] and Heme in [place] reluctant for this - discussion on surgical lung bx for lung neede but in conjunction with Myeloma treatment   GI bleeding    - stopped after stopping ofev  Plan  - monitor; GI consult if recurs  Hypertension  - improved after stopping ofev  Plan  - no more ofev - PCP addressing BP  Myeloma    - per Dr [name]  Steroid side effects  Weight gain  Plan  - monitor weight with reduced steroids  Followup  - 3-4 weeks video or ideally face to face visit   Followup  - next few to several weeks but after PFT and visiting with [facility]  - symptom score and walk test on room air at folowup  -

## 2021-12-04 NOTE — Progress Notes (Signed)
[place] [phone]   [phone]  PROGRESS NOTE  Patient Care Team: Antony Contras, MD as PCP - General (Family Medicine)  Hematological/Oncological History # IgG Kappa Multiple Myeloma 02/02/2021:  Presented to [place] [facility] due to right sided flank tenderness with bruising. CT abdomen/pelvis: Multiple small lytic lesions in the thoracolumbar spine and bilateral pelvis -SPEP: IgG 2,082 (H), M Protein 1.8 (H). IFE shows IgG monoclonal protein with kappa light chain specificity.  -LDH 169, CBC normal, CMP normal except for sodium 131 (L), Chloride 96 (L).   02/14/2021: Establish care with Dede Query PA-C 02/22/2021: bone marrow biopsy confirms the diagnosis of Multiple Myeloma with a monoclonal plasma cell population.  03/16/2021: Cycle 1 Day 1 of VRd chemotherapy  04/06/2021: Cycle 2 Day 1 of VRd chemotherapy  04/27/2021: Cycle 3 Day 1 of VRd chemotherapy  05/18/2021: Cycle 4 Day 1 of VRd chemotherapy  06/01/2021: drop dexamethasone to 55m PO weekly and start lasix due to shortness of breath.  06/15/2021: Desaturation to 87% on ambulation. HELD velcade today and sent to ED for evaluation.  06/22/2021: Findings consistent with drug reaction the lungs with eosinophils on BAL.  Given these findings we will definitely hold [name] and plan to avoid pomalidomide 06/29/2021: Cycle 1 Day 1 CyBorD chemotherapy 07/05/2021: HOLD chemotherapy given worsening lung function  Interval History:  Samuel [name] 66y.o. male with medical history significant for IgG Kappa multiple myeloma who presents for a follow up visit. The patient's last visit was on 10/10/2021.  In the interim since last visit he has continued  to see modest improvement in respiratory status.   On exam today Samuel [name] is accompanied by his wife.  He unfortunately developed a bout of bloody diarrhea after taking a new medication for his interstitial lung disease.  He notes that there was bright red blood  in the stool as well as clots.  He notes that there was no discomfort but he felt like something was not right.  He notes that he has discontinued the medication and he has resumed having normal stools.  He notes that he is typically using about 5 L of oxygen at home, particular while on the treadmill.  He notes that symptomatically he feels well but there is continued concern his lung function is worsening based on PFTs.  [facility] at [facility].  He will be seeing Dr. RChase Callernext week.  He denies any fevers, chills, sweats, nausea, vomiting or diarrhea.  A full 10 point ROS is listed below.  MEDICAL HISTORY:  Past Medical History:  Diagnosis Date   Asthma    GERD (gastroesophageal reflux disease)    High cholesterol    under control.    History of blood transfusion    Hypothyroidism    Interstitial lung disease (HCC)    Multiple myeloma (HCC)    Perennial allergic rhinitis    Pneumonia    Sciatic pain, right    Seasonal allergic rhinitis    Thyroid disease     SURGICAL HISTORY: Past Surgical History:  Procedure Laterality Date   BRONCHIAL BIOPSY  06/18/2021   Procedure: BRONCHIAL BIOPSIES;  Surgeon: BCollene Gobble MD;  Location: [facility];  Service: Cardiopulmonary;;   BRONCHIAL BIOPSY  08/28/2021   Procedure: BRONCHIAL BIOPSIES;  Surgeon: IGarner Nash DO;  [place]  Service: Cardiopulmonary;;   BRONCHIAL WASHINGS  06/18/2021  Procedure: BRONCHIAL WASHINGS;  Surgeon: Collene Gobble, MD;  Location: [facility];  Service: Cardiopulmonary;;   BRONCHIAL WASHINGS  08/28/2021   Procedure: BRONCHIAL WASHINGS;  Surgeon: Garner Nash, DO;  Location: [place];  Service: Cardiopulmonary;;   NASAL SINUS SURGERY     NECK SURGERY  1996   VIDEO BRONCHOSCOPY N/A 06/18/2021   Procedure: VIDEO BRONCHOSCOPY WITH FLUORO;  Surgeon: Collene Gobble, MD;   Location: [facility];  Service: Cardiopulmonary;  Laterality: N/A;   VIDEO BRONCHOSCOPY N/A 08/28/2021   Procedure: VIDEO BRONCHOSCOPY WITH FLUORO;  Surgeon: Garner Nash, DO;  Location: [place];  Service: Cardiopulmonary;  Laterality: N/A;    SOCIAL HISTORY: Social History   Socioeconomic History   Marital status: Married    Spouse name: Not on file   Number of children: 3   Years of education: Not on file   Highest education level: Not on file  Occupational History   Not on file  Tobacco Use   Smoking status: Former    Packs/day: 0.10    Years: 3.00    Pack years: 0.30    Types: Cigarettes    Quit date: 10/21/1982    Years since quitting: 39.1   Smokeless tobacco: Never  Vaping Use   Vaping Use: Never used  Substance and Sexual Activity   Alcohol use: Not Currently    Comment: 1 drink daily   Drug use: No   Sexual activity: Not on file  Other Topics Concern   Not on file  Social History Narrative   Not on file   Social Determinants of Health   Financial Resource Strain: Not on file  Food Insecurity: Not on file  Transportation Needs: Not on file  Physical Activity: Not on file  Stress: Not on file  Social Connections: Not on file  Intimate Partner Violence: Not on file    FAMILY HISTORY: Family History  Problem Relation Age of Onset   Hypertension Mother    Allergies Mother    Allergies Father    CAD Father 41   Breast cancer Paternal Grandmother    Hypertension Other    Heart disease Other     ALLERGIES:  is allergic to clarithromycin and penicillins.  MEDICATIONS:  Current Outpatient Medications  Medication Sig Dispense Refill   acetaminophen (TYLENOL) 500 MG tablet Take 1,000 mg by mouth every 6 (six) hours as needed for mild pain, moderate pain or headache.     acyclovir (ZOVIRAX) 400 MG tablet Take 1 tablet (400 mg total) by mouth 2 (two) times daily. 60 tablet 3   albuterol (VENTOLIN HFA) 108 (90 Base) MCG/ACT inhaler Inhale 2 puffs  into the lungs every 6 hours as needed for wheezing or shortness of breath. 8.5 g 1   ALPRAZolam (XANAX) 1 MG tablet Take 0.33 mg by mouth at bedtime.     aspirin EC 81 MG tablet Take 81 mg by mouth daily. Swallow whole. (Patient not taking: Reported on 10/10/2021)     [place] as directed.     bisoprolol (ZEBETA) 5 MG tablet Take 1 tablet (5 mg total) by mouth daily. 30 tablet 2   cholecalciferol (VITAMIN D) 1000 UNITS tablet Take 3,000 Units by mouth daily.     EPINEPHrine (EPI-PEN) 0.3 mg/0.3 mL DEVI Inject 0.3 mg into the muscle as needed.     ezetimibe-simvastatin (VYTORIN) 10-40 MG per tablet Take 1 tablet by mouth at bedtime.     fluticasone-salmeterol (Doyline) 517-00  MCG/ACT inhaler Inhale 2 puffs into the lungs 2 (two) times daily. 1 each 12   furosemide (LASIX) 20 MG tablet Take 1 tablet (20 mg total) by mouth as needed for fluid or edema. 14 tablet 1   ketoconazole (NIZORAL) 2 % cream SMARTSIG:1 Application Topical 1 to 2 Times Daily     lenalidomide ([name]) 10 MG capsule TAKE 1 CAPSULE DAILY FOR 14 DAYS, NONE FOR 7 DAYS 14 capsule 0   levothyroxine (SYNTHROID) 150 MCG tablet Take 150 mcg by mouth every other day. Alternate     levothyroxine (SYNTHROID) 175 MCG tablet Take 1 tablet by mouth every other day.     montelukast (SINGULAIR) 10 MG tablet Take 10 mg by mouth at bedtime.     Multiple Vitamin (MULTIVITAMIN) tablet Take 1 tablet by mouth daily.     Nintedanib (OFEV) 150 MG CAPS Take 1 capsule (150 mg total) by mouth 2 (two) times daily. (Patient not taking: Reported on 11/30/2021) 60 capsule 3   omeprazole (PRILOSEC) 40 MG capsule Take 1 capsule by mouth daily.     ondansetron (ZOFRAN) 8 MG tablet Take 1 tablet (8 mg total) by mouth every 8 (eight) hours as needed for nausea or vomiting. 30 tablet 0   OXYGEN Inhale 2 L into the lungs continuous.     predniSONE (DELTASONE) 10 MG tablet Take 1.5 tablets (15 mg total) by mouth daily with breakfast. 45  tablet 0   prochlorperazine (COMPAZINE) 10 MG tablet Take 1 tablet (10 mg total) by mouth every 6 (six) hours as needed for nausea or vomiting. 30 tablet 0   sildenafil (REVATIO) 20 MG tablet Take 20 mg by mouth daily as needed (ED).     sodium chloride (OCEAN) 0.65 % SOLN nasal spray Place 1 spray into both nostrils as needed for congestion.     sulfamethoxazole-trimethoprim (BACTRIM DS) 800-160 MG tablet Take 1 tablet by mouth 3 (three) times a week. 38 tablet 3   tamsulosin (FLOMAX) 0.4 MG CAPS capsule Take 0.4 mg by mouth daily.     triamcinolone cream (KENALOG) 0.1 % Apply 1 application topically daily as needed (sun burn itch).     No current facility-administered medications for this visit.    REVIEW OF SYSTEMS:   Constitutional: ( - ) fevers, ( - )  chills , ( - ) night sweats Eyes: ( - ) blurriness of vision, ( - ) double vision, ( - ) watery eyes Ears, nose, mouth, throat, and face: ( - ) mucositis, ( - ) sore throat Respiratory: ( - ) cough, ( - ) dyspnea, ( - ) wheezes Cardiovascular: ( - ) palpitation, ( - ) chest discomfort, ( - ) lower extremity swelling Gastrointestinal:  ( - ) nausea, ( - ) heartburn, ( - ) change in bowel habits Skin: ( - ) abnormal skin rashes Lymphatics: ( - ) new lymphadenopathy, ( - ) easy bruising Neurological: ( - ) numbness, ( - ) tingling, ( - ) new weaknesses Behavioral/Psych: ( - ) mood change, ( - ) new changes  All other systems were reviewed with the patient and are negative.  PHYSICAL EXAMINATION: ECOG PERFORMANCE STATUS: 0 - Asymptomatic  Vitals:   11/30/21 1121  BP: 135/87  Pulse: 72  Resp: 17  Temp: 97.6 F (36.4 C)  SpO2: 99%     Filed Weights   11/30/21 1121  Weight: 222 lb (100.7 kg)    GENERAL: well appearing middle aged Caucasian male alert, no distress and  comfortable SKIN: skin color, texture, turgor are normal, no rashes or significant lesions EYES: conjunctiva are pink and non-injected, sclera clear LUNGS:  Decreased airflow with no crackles or rails.  No wheezing noted. HEART: regular rate & rhythm and no murmurs and no lower extremity edema Musculoskeletal: no cyanosis of digits and no clubbing  PSYCH: alert & oriented x 3, fluent speech NEURO: no focal motor/sensory deficits  LABORATORY DATA:  I have reviewed the data as listed CBC Latest Ref Rng & Units 11/30/2021 11/12/2021 10/10/2021  WBC 4.0 - 10.5 K/uL 12.5(H) 11.7(H) 11.1(H)  Hemoglobin 13.0 - 17.0 g/dL 12.6(L) 14.3 14.4  Hematocrit 39.0 - 52.0 % 36.6(L) 40.1 41.8  Platelets 150 - 400 K/uL 198 205 203    CMP Latest Ref Rng & Units 11/30/2021 11/12/2021 10/10/2021  Glucose 70 - 99 mg/dL 96 103(H) 81  BUN 8 - 23 mg/dL _0 Creatinine 0.61 - 1.24 mg/dL 0.94 0.82 0.97  Sodium 135 - 145 mmol/L 130(L) 129(L) 131(L)  Potassium 3.5 - 5.1 mmol/L 4.5 4.3 4.1  Chloride 98 - 111 mmol/L 96(L) 93(L) 95(L)  CO2 22 - 32 mmol/L _1 Calcium 8.9 - 10.3 mg/dL 9.1 9.1 9.1  Total Protein 6.5 - 8.1 g/dL 7.2 7.6 7.3  Total Bilirubin 0.3 - 1.2 mg/dL 0.4 0.7 0.4  Alkaline Phos 38 - 126 U/L 31(L) 34(L) 36(L)  AST 15 - 41 U/L 23 33 20  ALT 0 - 44 U/L _2 Lab Results  Component Value Date   MPROTEIN Not Observed 11/12/2021   MPROTEIN Not Observed 10/10/2021   MPROTEIN Not Observed 08/31/2021   Lab Results  Component Value Date   KPAFRELGTCHN 23.7 (H) 11/12/2021   KPAFRELGTCHN 23.3 (H) 10/10/2021   KPAFRELGTCHN 22.8 (H) 08/31/2021   LAMBDASER 13.6 11/12/2021   LAMBDASER 13.8 10/10/2021   LAMBDASER 10.8 08/31/2021   KAPLAMBRATIO 1.74 (H) 11/12/2021   KAPLAMBRATIO 1.69 (H) 10/10/2021   KAPLAMBRATIO 2.11 (H) 08/31/2021    RADIOGRAPHIC STUDIES: I have personally reviewed the radiological images as listed and agreed with the findings in the report: lytic lesions in the hip bones bilaterally.  No results found.   ASSESSMENT & PLAN Samuel [name] 66 y.o. male with medical history significant for IgG Kappa multiple myeloma  who presents for a follow up visit.   After review of the labs, review of the records, and discussion with the patient the findings are most consistent with an IgG kappa multiple myeloma.  The patient has 2 lytic lesions within the pelvic bones (more noted in spine on CT scan) as well as 10% plasma cells within the bone marrow biopsy.  This combined with his serological findings confirm the diagnosis of multiple myeloma.  The initial treatment of choice for this patient's multiple myeloma was VRd. This will consist of bortezomib 1.77m/m2 on days 1, 8, 15, dexamethasone 454mon days 1,8, and 15, and revlimid 2540mO daily days 1-14. Cycles will consist of 21 days. We will use this regimen to stabilize the patient's myeloma, then make a referral to a BMT center of his choosing for consideration of a bone marrow transplant once he has been noted to have a good response (VGPR or better). Zometa was started after dental clearance was obtained. This will be continued x 2 years.   Unfortunately had a drug reaction to [name] and was admitted to the hospital from 06/15/2021 until 06/18/2021.  The patient underwent a B AL which  clearly showed evidence of eosinophils.  This is consistent with a drug reaction most likely caused by the patient's [name].  Discussed the case with pulmonology who recommended that we not rechallenge for attempt other drugs in the same class such as pomalidomide.  Given the patient's excellent response to [facility] therapy might preference would be to continue that.  As such I would recommend we proceed with CyBorD chemotherapy.  Daratumumab-based therapy could have been considered, but is often paired with an immunologic.  Additionally I would like to preserve those for additional lines of therapy if necessary.  Therefore we will proceed with CyBorD chemotherapy have the patient referred to transplant.  R-IPSS score: Stage 2. PFS of 42 months  # IgG Kappa Multiple Myeloma (t11;14,  standard risk)  --diagnostic criteria was met with monoclonal plasma cells in the bone marrow and lytic lesions on the bone --recommend proceeding with VRd chemotherapy as noted above --patient is young and healthy enough for consideration of BMT, though he is borderline with his age. Will consider referral pending response to treatment.  -- Cycle 1 Day 1 started on 03/16/2021 --decreased dexamethasone to 51m PO weekly due to fluid overload. Also started lasix 254mPO (changed on 06/01/2021) --plan to HOLD revlimid moving forward after evidence of drug reaction in the lungs. --started CyBorD chemotherapy on 06/29/2021 --patient has reached a VGPR. [facility]  Plan: --will continue to HOLD chemotherapy at this time, allowing his respiratory status to recuperate and pulmonology to help optimize his breathing prior to rechallenging him --ILD is not improving on steroid therapy. Pulmonology performed bronchoscopy with biopsies. [facility] --At the risk of worsening his lung function will continue to hold therapy.  --RTC 8 weeks to readdress.   #Drug Reaction in the Lung --confirmed with increased eosinophils on BAL during admission for shortness of breath --currently on a steroid taper, recently dose increased and restarted.  --will need to hold revlimid and and avoid the use of pomalidomide moving forward. Do no recommend rechallenge --patient will continue to follow up with pulmonology.   #Supportive Care -- chemotherapy education complete --zometa therapy started on 04/05/2021 (4 mg IV q 3 months), HOLDING due to lung issues above --ASA 8144mO daily for thromboprophylaxis on revlimid -- zofran 8mg68mH PRN and compazine 10mg90mq6H for nausea -- acyclovir 400mg 50mID for VCZ prophylaxis -- no pain medication required at this time.   No orders of the defined types were placed in this encounter.   All questions were  answered. The patient knows to call the clinic with any problems, questions or concerns.  A total of more than 30 minutes were spent on this encounter with face-to-face time and non-face-to-face time, including preparing to see the patient, ordering tests and/or medications, counseling the patient and coordination of care as outlined above.   Virat Prather TLedell Peoplesepartment of Hematology/Oncology [facility]: [phone]: [phone]: [email or web address]  12/04/2021 10:00 AM

## 2021-12-04 NOTE — Telephone Encounter (Addendum)
° °  Front desk/[name]  Asahel Risden Kitt  -Please try to get the spirometry and DLCO in the next 1-2 weeks.  Based on the results I might do a video visit with him sooner but for now you can leave the appointment with me for January 11, 2022 intact but I really need the pulmonary function test sooner. He is declining and needs PFT soon. I have to decide on cytoxan   - Please proceed with the [facility] referral both to the interstitial lung disease clinic and also myeloma clinic . I heard from Dr Stefanie Libel at [facility] and his recomendiation is ILD clinic and myeloma clinic  Thanks  MR

## 2021-12-04 NOTE — Progress Notes (Signed)
IOV 10/04/2011  66 year old male. Allergies and possible asthma. Hypothyroidism. Anxiety state but no formal diagnosis. Has history of claustrophobia as well Non-smoker. Music therapist. Dad with CAD - [name]/p CABG at age 37 (now 30y)  Referred by Dr Samuel [name]. Reports dyspnea. INsidious onset "all my life". Increased after starting allergy shots in June 2012. Says during hikes after he gets going it gets better. Same in gym. Notices more when he is starting out or stationary at rest. HE is not sure what it is.  Feels he needs to take a deep breath on occasion. So, now referred to cardiology and pulmonary. Says ICS Qvar in august/sept 2012 made it worse. Outside chart mentions asthma but patient says not sure if he has asthma. But reports childhood hx of asthma diagnosed by a pediatrician at age 56. Says he had similar symptoms.  Up until 1990 used to 10K but even back then had similar symptoms and would need to warm up before feeling better. Then stopped running due to neck problems. Similar during swimming exercise in 1996-1997. Quit swimming 1999. Episodes associated with wheezing but no cough. Unclear if albuterol helps but on xopenex prn esp before walking. This whole thing feels like he is not getting "enough juice". Never had PFT but recollects normal spirometry at Dr Samuel [name] office.  He has seen Dr Samuel [name] cardiology for above - treadmill and echo and carotid doppler all pending. Current pulmonary modulating drug is Singulair only  Chest x-ray 07/16/2011 is clear per my personal review.  xxxxxxxxxxxxxxxxxxxxxxxxxxxxxxxxxxxxxxxxxxxxxxxxxxxxxxxxxxxxxxxxxx  Inpatient consult 06/16/21 66 y/o male with a history of multiple myeloma diagnosed in 2022, currently undergoing treatment presented to the [facility] emergency department from his hematology oncology appointment with a chief complaint of dyspnea.  The patient smoked cigarettes briefly as a young adult but since then has lived a relatively  healthy lifestyle staying active with exercise and playing golf.  He was diagnosed with multiple myeloma in the spring 2022 when a bruise was discovered while he was receiving a sports massage.  He went for further work-up and was found to have abnormal lab work, this led to a bone marrow biopsy confirming diagnosis of multiple myeloma.  He has been followed by Dr. Lorenso [name] who has been treating him with VRd chemotherapy since Mar 16, 2021.  He is currently in the middle of his fifth cycle.     Over the last several weeks he has been developing progressive shortness of breath with exertion.  He says this has been going on for about 3 to 4 weeks and has significantly worsened in the last week.  This is associated with a dry, rare cough.  He denies fevers, chills, leg swelling, weight gain, or mucus production.  In the last several days (just prior to admission) [place] and while there felt significant shortness of breath so he presented to Dr. Libby [name] office for further evaluation.  He was sent to the emergency room for evaluation further because of the severity of his shortness of breath.  In the emergency department he was noted to have hypoxemia to an O2 saturation of 87% and an abnormal chest exam.  Pulmonary and critical care medicine was consulted for further evaluation.  He says that throughout his adult life he has had what he calls "huffing" when he talks while exerting himself or with exertion.  He says he has been followed by an allergist and has been treated for asthma at  times over the years.  He does not regularly use an inhaler and he says this is not something that he routinely needs.  He was seen by my partner Dr. Chase [name] at 1 point in 2013 during which time he had a normal chest x-ray and normal lung function testing.   He had COVID in April 2022.   June 15, 2021 admission August 26 CT chest images independently reviewed: No pulmonary embolism, diffuse interlobular septal  thickening, pleural nodularity noted, areas of groundglass opacification with some patchy distribution particularly in the lung base, no significant pleural effusion, mild reactive appearing lymphadenopathy in the mediastinum Acute respiratory failure with hypoxemia in the setting of diffuse parenchymal lung disease of undetermined etiology: This is somewhat of a complex case given his underlying asthma and the fact that he had COVID in 2022 and in April 2022 a CT scan of his abdomen showed some ill-defined interstitial changes in the periphery of his lungs on lung windows in the bases.  Based on the time course of his illness and reports in the literature I am most concerned about [name] lung toxicity (see citation below), though it is also important to evaluate for another underlying and progressive primary pulmonary ILD or less likely an opportunistic infection (history doesn't seem to support this).  [name] can cause eosinophilic pneumonia which we can assess for with a bronchoscopy, but his imaging characteristics aren't typical for this.  It is possible that the non-specific interstitial changes seen on his lungs in April 2022 were related to his recent COVID infection.  I don't think his baseline asthma explains his symptoms, though he did have a high serum eosinophil count a few weeks prior to admission (unclear significance).  Samuel [name] [name], Samuel [name] M, Samuel [name], Samuel [name], Samuel [name], Samuel [name], Samuel [name], Samuel [name], Samuel [name], Samuel [name]. Bortezomib induced pulmonary toxicity: a case report and review of the literature. Am J Blood Res. 2020 Dec 15;10(6):407-415. PMID: 50277412; PMCID: [id number].  06/18/21 -   Results for Samuel [name], Samuel [name]" (MRN [id number]) as of 07/12/2021 09:13  Ref. Range 06/18/2021 12:57  Monocyte-Macrophage-Serous Fluid Latest Ref Range: 50 - 90 % 5 ([name])  Other Cells, Fluid Latest Units: % CORRELATE WITH CYTOLOGY.  Color, Fluid Latest Ref Range: YELLOW  PINK (A)  Total  Nucleated Cell Count, Fluid Latest Ref Range: 0 - 1,000 cu mm 103  Fluid Type-FCT Unknown BRONCHIAL ALVEOLAR LAVAGE  Lymphs, Fluid Latest Units: % 18  Eos, Fluid Latest Units: % 51  Appearance, Fluid Latest Ref Range: CLEAR  HAZY (A)  Neutrophil Count, Fluid Latest Ref Range: 0 - 25 % 26 ([name])  FINAL MICROSCOPIC DIAGNOSIS:  - No malignant cells identified  - Benign bronchial cells and pulmonary macrophages   Titrate off solumedrol and start prednisone 50 mg daily. Decrease by 10 mg weekly    OV 07/12/2021  Subjective:  Patient ID: Samuel [name], male , DOB: 07-Jun-1956 , age 66 y.o. , MRN: [id number] , ADDRESS: [place] [id number] PCP Antony Contras, MD Patient Care Team: Antony Contras, MD as PCP - General (Family Medicine)  This Provider for this visit: Treatment Team:  Attending Provider: Brand Males, MD    07/12/2021 -   Chief Complaint  Patient presents with   Hospitalization Follow-up    Pt states he was doing better after being out of the hospital after being placed on steroids. States about a week after being out, states he started having more problems  with SOB and to today, he has had problems with SOB with exertion.   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 -presents for follow-up from the hospital.  I originally met him 10 years ago and then at that point in time he had dyspnea.  He tells me that after that he was exercising and living a good life.  Then in April 2022 got diagnosed incidentally with myeloma multiple.  Says he was caught early.  Then in May 2022 started on [name] and [name].  He says he was doing well with this up until mid July.  Up until then he was walking 1-1/2 2 miles per day 5 days a week.  Then in late July 2022 started noticing shortness of breath.  He met Dr. Lorenso [name] his oncologist on May 25, 2021.  By June 01, 2021 he had severe pulmonary  infiltrates.  He says a part of then he was getting his [name] once a week along with some steroids with each dose.  At this point in time the steroid dose was cut down but he progressed and started getting worse.  Then on June 15, 2021 he was in significant respiratory distress and admitted to the hospital.  I reviewed the hospital records.  He was there for 3 days.  He underwent bronchoscopy with BAL there is also 51% eosinophilia.  Cultures are negative malignant cells are negative.  He was seen by my pulmonary colleagues.  Discussion was held with the [name] other [name] was the etiology.  Given the use and failure it looks like his [name] has been held.  He was discharged on 50 mg prednisone with advised to taper it by 10 mg/week.  Currently is on 20 mg prednisone.  He started playing golf again and feeling really good through Labor Day weekend..  Then on June 29, 2021 his [name] was not given.  Up until this point all chemo was held.  He was given Cytoxan, Zomenta and [name].  The very next in June 30, 2021 he started feeling worse.  He feels [name] is the etiology for this.  On July 06, 2021 he communicated this with Dr. Lorenso [name] and all chemo has been held.  He is being referred to the bone marrow transplant program at [facility] for further evaluation.  At this point in time he says he is much better than when he was in the hospital but definitely not as good as he was prior to getting a rechallenge with [name] on June 29, 2021.  But he notices that he slowly improving.  Walking desaturation test shows that his pulse ox is holding up although he does have a tendency to desaturate.   Did extensive review of the literature.  According to up-to-date [name] is a known etiology for pulm eosinophilia but he got worse after getting rechallenged with [name].  Review of the literature shows that [name] can cause drug-induced pneumonitis and a small fraction of patients.  The  literature is  NO INFOR  as to whether these patients have eosinophilia or not but definitely they seem to be steroid responsive.  This literature is attached below.  In the timeframe also fits in with [name].  He is also noticed to be on allopurinol which rarely can cause hypersensitivity syndrome or dermatitis, hepatitis and eosinophilia - ADDRESS Syndrome but he did not have thse features of rash etc     Nevertheless significant eosinophilia in  the BAL 51% suggest drug-induced pneumonitis.  He currently has significant symptoms of shortness of breath.  He does not have oxygen with him at home.  Simple office walk 185 feet x  3 laps goal with forehead probe 07/12/2021    O2 used ra   Number laps completed 3   Comments about pace slow   Resting Pulse Ox/HR 100% and 69/min   Final Pulse Ox/HR 92% and 81/min   Desaturated </= 88% no   Desaturated <= 3% points Yes 8   Got Tachycardic >/= 90/min no   Symptoms at end of test No complaints   Miscellaneous comments x        Causes of Pulmonary Eosinophilia: from UPTODATE A. Nonsteroidal antiinflammatory drugs (NSAIDs) [name]. Nitrofurantoin  C. Minocycline D. Sulfonamides/ Sulfamethoxazole E. Ampicillin F. Daptomycin G.Vancomycin [name]. Dapsone I. Beta-lactam antibiotics J. Nevirapine K. Telaprevir [name]. Sulfasalazine M. Methotrexate [name]. Mesalamine O. Amiodarone P. Bleomycin Q. ACE Inhibitor R. Beta blocker [name]. Hydrochlorothiazides T. [name]- Tryptophan U. Allopurinol V. Carbamazepine W. Lamotrigine X. Phenytoin Y. Phenindione Z. Fluindione AA. Olanzapine AB. Oxcarbazepine AC. Strontium Ranelate AD. Lenalidomide AE. Radiographic contrast media  Samuel [name] [name], Kalyon [name], [place] M, Samuel [name], Samuel [name], Samuel [name], Samuel [name], Samuel [name], Samuel [name], [place], [place] [name]. Bortezomib induced pulmonary toxicity: a case report and review of the literature. Am J Blood Res. 2020 Dec 15;10(6):407-415. PMID: 66440347; PMCID: [id number]. - Bortezomib  related pulmonary toxicities are rarely reported. Although the incidence of Bortezomib induced lung injury (BLI) is unknown, in a large registry study of 1010 MM patients, 45 patients were reported to have BLI by their physician. However, the causality could only be constituted in 26 patients (2.6%), with 5 of them resulting in death despite steroid treatment.In the case reports, the average number of RVD cycles until the toxicity was presented was 6.9, and the period between the development of pulmonary toxicity and the first dose of Bortezomib was 31.1 days -No mention of eosinophilia  Bortezomib therapy-related lung disease in [place] patients with multiple myeloma: Incidence, mortality and clinical characterization Dyann Ruddle [facility] Miyao,2 [place] Masahiko Kusumoto,5 [place] Fumikazu IEPPI,9 [place] Sugiyama,8 [place] Hatake,9 [place] Pao Fukuda,10 and [place] patients registered, 67 (4.5%) developed BILD, 5 (0.50%) of whom had fatal cases. The median time to BILD onset from the first bortezomib dose was 14.5 days, and most of the patients responded well to corticosteroid therapy. A retrospective review by the Lung Injury Medical Expert Panel revealed that the types with capillary leak syndrome and hypoxia without infiltrative shadows were uniquely and frequently observed in patients with BIL - no mention of eosinophilia  CellularOperator.fi =- allopurinol complex multisystem Hypersensitivityey   has a past medical history of Asthma, High cholesterol, Perennial allergic rhinitis, Sciatic pain, right, Seasonal allergic rhinitis, and Thyroid disease.   reports that he quit smoki     07/20/2021  - Visit, NP [name]    66 year old male former smoker followed in our office for shortness of breath and interstitial lung disease.  Established with Dr. Chase [name].  Initially  consulted when inpatient with our team in August/2022.  Chief complaint at that point time was dyspnea.  He was diagnosed with multiple myeloma in the spring 2022 when a bruise was discovered while he was recovering from a sports massage.  He went for further work-up and he was found to have abnormal lab work this led to a bone  marrow biopsy confirming diagnosis of multiple myeloma.  He has been followed by Dr. Lorenso [name].  He was in the middle of treating him with VRD chemotherapy since May/20 04/2021.  He was in the middle of his fifth cycle.  Patient also had [place] in April/2022.  Chest CT in April/2022 showed ill-defined interstitial changes in the periphery.  Concern for [name] lung toxicity.  Patient also had a bronchoscopy performed in 06/18/2021.  Eosinophils 51%, lymphs 18%, no malignant cells identified patient was started on a long-term prednisone taper.  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  Still currently having significant symptoms of shortness of breath.    Last seen in our office on 07/12/2021 by Dr. Chase [name].  Plan of care at that office visit was as follows: Stop [name], check chest x-ray, check CBC with differential, blood ESR, blood IgE, vitamin D, hemoglobin A1c, G6PD, walk test, check [place] at home, change prednisone to 50 mg daily for 2 weeks, then 40 mg daily for 2 weeks, then 30 mg daily for 2 weeks, then 20 mg daily for 2 weeks, 2-week follow-up with APP or Dr. Chase [name], 4-week follow-up with Dr. Chase [name] and 30-minute time slot.  Per chart review on 07/13/2021 [name] was discontinued.  Patient was also seen by Dr. Lorenso [name] on 07/18/2021 I am unable to view this note.  Patient reporting that weight team would like for him to resume [name] at a maintenance dose of 10 mg.  This is the current plan.  He will see oncology next Friday for blood work.  Patient is scheduled to complete an overnight oximetry test next Tuesday.  He remains  adherent to his prednisone taper 50 mg daily.  Patient reports that this weekend he did play 9 holes of golf on Saturday and on Sunday.  He was limited by his physical exertion.  He also exercised on Tuesday and worked out with a Clinical research associate.  Patient is eager to get back to baseline physical activity.  Patient reports that on Wednesday (07/18/2021) of this week he had increased shortness of breath, cough, worsened acid reflux and he vomited.  Patient believes that this may also be due to the fact that he ate a dinner meal quite quickly.  This sometimes happens when he does this.  He also reports that his blood pressure was high at that time.  Patient reports that he has been off the [name] for at least 1 month.  Patient has stopped his allopurinol as of 07/18/2021.  Patient and spouse are both frustrated regarding dyspnea and have hopes that he would be improving quicker.  There are also concerns that he may have acute worsened symptoms or an acute infection such as bronchitis.  We will discuss and evaluate for this today.  Walk today in office: 07/20/2021-completed 2 laps on room air, dropped to 93%  07/26/2021- Interim hx  Patient presents today for 1 week follow-up. ILD felt to be related to drug pneumonitis from [name] as well as [name] for his treatment of multiple myeloma. BAL showed significant eosinophilia. Dr. Lorenso [name] lowered dose of [name] to 49m daily. CXR on 07/20/21 showed chronic ILD, no definite acute findings. Ambulatory walk during his last visit showed no oxygen desaturations. During his last visit he was ordered for HRCT, PFTs and ONO.   Accompanied by his wife. He had a bad weekend, his respiratory symptoms have been slightly better the last two days. He is currently off BOTH [name] and [name] (may retry  [name] at lower dose). He stopped using BREO d/t throat irritation. Xanax has helped relieves some anxiety and chest tightness. Wife reports that he is sleeping better. He in  on prolonged prednisone taper. He will be starting 38m prednisone tomorrow  x 2 weeks. He is taking Singulair and generic fluticasone nasal spray. He has been off allergy shots since August. HRCT and PFTs are scheduled for next week. Awaiting results to be faxed for ONO from [employer].    OV 08/02/2021 -   Subjective:  Patient ID: VBillee [name] male , DOB: [birth year], age 66y.o. , MRN: [id number], AD[place]ESS: [id number] SAntony Contras MD Patient Care Team: SAntony Contras MD as PCP - General (Family Medicine)  This Provider for this visit: Treatment Team:  Attending Provider: RBrand Males MD  Type of visit: Telephone/Video Circumstance: COVID-19 national emergency Identification of patient Samuel SLEETHwith 1February 15, 1957and MRN [id number]- 2 person identifier Risks: Risks, benefits, limitations of telephone visit explained. 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 CT abdomen/pelvis: Multiple small lytic lesions in the thoracolumbar spine and bilateral pelvis -SPEP: IgG 2,082 ([name]), M Protein 1.8 ([name]). IFE shows IgG monoclonal protein with kappa light chain specificity.  -LDH 169, CBC normal, CMP normal except for sodium 131 ([name]), Chloride 96 ([name]).   02/14/2021: Establish care with IDede QueryPA-C 02/22/2021: bone marrow biopsy confirms the diagnosis of Multiple Myeloma with a monoclonal plasma cell population.  03/16/2021: Cycle 1 Day 1 of VRd chemotherapy  04/06/2021: Cycle 2 Day 1 of VRd chemotherapy  04/27/2021: Cycle 3 Day 1 of VRd chemotherapy  05/18/2021: Cycle 4 Day 1 of VRd chemotherapy  06/01/2021: drop  dexamethasone to 242mPO weekly and start lasix due to shortness of breath.  06/15/2021: Desaturation to 87% on ambulation. HELD velcade today and sent to ED for evaluation.  06/22/2021: Findings consistent with drug reaction the lungs with eosinophils on BAL.  Given these findings we will definitely hold [name] and plan to avoid pomalidomide 06/29/2021: Cycle 1 Day 1 CyBorD chemotherapy   HPI ViDomnic [name] [age].o. -there is a telephone visit.  He is supposed to see me next week but he had a CT scan of the chest 2 days ago and had pulmonary function test today.  He really wanted to discuss the results today itself.  Review of the records indicate that he still on prednisone taper.  He is able to ambulate and desaturated only after 200 feet.  There are some mild nocturnal desaturation.  We started 2 [name] of nasal cannula oxygen.  His CT scan of the chest shows diffuse groundglass opacity in a pattern that is inconsistent with UIP [less than 40% chance this is UIP] suggestive of alternate pattern.  He says he is able to do weight training exercises well but when he walks on a treadmill or does ambulation that is when it bothers him.  When he rests he is better.  He does desaturate to 80s percent.  He is wondering if this could be from asthma.  I told him otherwise.  Touch base with Dr. DoLorenso Courieris oncologist.  He is  scheduled for cyclophosphamide tomorrow along with low-dose [name].  This was held off recently in September 2022.  But oncology is wanting to rechallenge him with low-dose [name].  They wanted approval for this.   ONO RA 07/24/21   - - 45 min spent <88%. 7+ hours was > 90%. OVerall not bad  Plan  Start 2L [place] QHS  Results for MAIKEL, NEISLER" (MRN [id number]) as of 08/02/2021 12:16  Ref. Range 08/02/2021  08/02/2021   FVC-Pre Latest Units: [name] 1.55   FVC-%Pred-Pre Latest Units: % 33   Results for PHYLLIS, WHITEFIELD" (MRN [id number]) as of 08/02/2021 12:16  Ref. Range  08/02/2021    DLCO cor Latest Units: ml/min/mmHg 12.52   DLCO cor % pred Latest Units: % 46   Results for AKIEM, URIETA" (MRN [id number]) as of 08/02/2021 12:16  Ref. Range 08/02/2021    TLC Latest Units: [name] 4.18   TLC % pred Latest Units: % 59    Results for JOHNTHOMAS, LADER" (MRN [id number]) as of 08/02/2021 12:16  Ref. Range 07/12/2021 09:54  G-6PDH Latest Ref Range: 7.0 - 20.5 U/g Hgb 14.8   CT Chest data  CT Chest High Resolution  Result Date: 07/31/2021 CLINICAL DATA:  Evaluate for interstitial lung disease EXAM: CT CHEST WITHOUT CONTRAST TECHNIQUE: Multidetector CT imaging of the chest was performed following the standard protocol without intravenous contrast. High resolution imaging of the lungs, as well as inspiratory and expiratory imaging, was performed. COMPARISON:  Chest CT dated June 15, 2021; abdomen and pelvis CT dated February 02, 2021 FINDINGS: Cardiovascular: Cardiomegaly with trace pericardial effusion. Coronary artery calcifications of the RCA and LAD. Atherosclerotic disease of the thoracic aorta. Mediastinum/Nodes: Esophagus is unremarkable. Atrophic thyroid. Mediastinal lymph nodes are decreased in size when compared with prior exam. Reference AP window lymph node on series 2, image 42 measures 1.1 cm in short axis, previously 1.3 cm. Lungs/Pleura: Central airways are patent. Images are motion degraded. Mild diffuse ground-glass opacity with peribronchovascular and subpleural reticular glass opacities and traction bronchiectasis. No clear craniocaudal predominance. Mild bilateral air trapping. 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 Stable solid right middle lobe pulmonary nodule measuring 3 mm on series 3, image 57. Upper Abdomen: No acute abnormality. Musculoskeletal: No chest wall mass or suspicious bone lesions identified. IMPRESSION: Limited evaluation due to respiratory motion artifact. Within limitations, there are diffuse bilateral  ground-glass opacities with peribronchovascular and subpleural reticular opacities, traction bronchiectasis and no clear craniocaudal predominance. Differential considerations include sequela of acute lung injury, NSIP, or fibrotic HP given presence of air trapping. Mild subpleural reticular opacities were present on visualized portions of the lung on prior abdomen and pelvis CT dated February 02, 2021, although majority of findings are new. Findings are suggestive of an alternative diagnosis (not UIP) per consensus guidelines: Diagnosis of Idiopathic Pulmonary Fibrosis: An Official ATS/ERS/JRS/ALAT Clinical Practice Guideline. [place], Iss 5, [phone], Jun 21 2017. Small solid pulmonary nodule the right middle lobe measuring 3 mm. No follow-up needed if patient is low-risk. Non-contrast chest CT can be considered in 12 months if patient is high-risk. This recommendation follows the consensus statement: Guidelines for Management of Incidental Pulmonary Nodules Detected on CT Images: From the Fleischner Society 2017; Radiology 2017; 284:228-243. Aortic Atherosclerosis (ICD10-I70.0). Electronically Signed   By: Yetta Glassman M.D.   On: 07/31/2021 12:01         08/16/2021 -  fu drug induced  pneumonitis   HPI Samuel [name] 66 y.o. -59md desaturated. STarted on portable o2 since yesterday and Is feeling better. On Room air say he is desaturated.  Called to discuss Bronch . He is scheduled to see DR PAtwardhan 08/22/21 wed at 8.30am. PRefers not to have bronch done at that that tme.  Discussed the consensus about having bronchoscopy with lavage to rule out any opportunistic infections.  At this time I took the opportunity of also recommending transbronchial biopsies.  He did have transbronchial biopsies when he was a lot more hypoxemic in August 2022 we will send for histopathology and was nondiagnostic.  This time I recommended we send it off for RNA genomic classifier for UIP it is  not a sensitive test but it is specific.  If it comes back positive then we would know that there is permanency to this and also its a marker of progression potentially.  He is willing to go through this.  Explained we under general anesthesia. Based on schedule will be myself or Dr. IValeta Harmsdoing it.  Explained the following risks   Risks of pneumothorax, hemothorax, sedation/anesthesia complications such as cardiac or respiratory arrest or hypotension, stroke and bleeding all explained. Benefits of diagnosis but limitations of non-diagnosis also explained. Patient verbalized understanding and wished to proceed.    We then discussed pulse dose steroids.  Told him we will have to wait close to a week to make sure there is fungal smears PCP and AFB smears and bacterial culture negative.  At that point we will have to take a decision on giving 1 g Solu-Medrol for the a day for 3 days.  Ideally would need admission he does not want this.  He wants to do with is on an outpatient setting ideally.  Explained to him about the risks with steroids such as hyperglycemia, hypertension but said that I would try to work with the DME company or outpatient/home nursing to see if this would be possible.  He was appreciative.  Also discussed with Dr. IValeta Harmswho is willing to do the biopsy based on schedule of the operating room, myself and the patient if things do not work out.  Sent a secure chat to Dr. PVirgina Jock  Told him about the patient.  He will give [place] clearance on 08/22/2021.  Recommended also look for BNP and heart failure.  CT Chest data  No results found.   OV 08/09/2021  Subjective:  Patient ID: VBillee [name] male , DOB: 1Jul 12, 1957, age 66y.o. , MRN: [id number], [place]: [place] [id number] SAntony Contras MD Patient Care Team: SAntony Contras MD as PCP - General (Family Medicine)  This Provider for this visit: Treatment Team:  Attending Provider: RBrand Males  MD    08/09/2021 -   Chief Complaint  Patient presents with   Follow-up    Pt states he is about the same since last visit, maybe a little better. Pt is coughing more at times and states he does cough a lot before bed.   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 -returns for follow-up.  He is finishing up 40 mg of prednisone per day.  Restarting 30 mg/day of prednisone tomorrow.  He is not really better.  Today he was able to only walk 2 out of the customary 3 laps.  And he showed a tendency to desaturate.  He says  he is extremely anxious.  This is understandable.  He also states that when he lifts weights in the gym he does not have a problem but when he exerts himself he feels worse.  Previously his nocturnal desaturation test showed abnormality and we recommended night oxygen but he declined per the CMA.  But today he and his wife tell me that they would be interested in getting some oxygen if it would help with shortness of breath.  Simple walking desaturation test does not make him qualify and will need a 6-minute walk test to qualify for portable oxygen.  They are also worried about the lack of improvement.  They are wondering about second opinion.  I did unofficially check with some of my colleagues in the [place].  No clear-cut plan has been developed.  We discussed about the possibility of visiting Dr. Virgel Manifold, ILD group in [place].  They seemed enthused about the idea but wanted me to reach out to Dr. Ovid Curd first.  In terms of his myeloma review of the medical records from office visit 08/03/2021 with Dr. Lorenso [name] and talking to the patient and the labs show the myeloma still in remission.  But Dr. Lorenso [name] is extremely concerned that the myeloma will come back.  Oncology still wants to rechallenge him with [name] but after my personal discussion with Dr. Lorenso [name] on  08/02/2021 and concern for pulmonary toxicity chemotherapy is on hold till patient recovers.  He is also concerned about the presence of coronary artery calcification on his recent CT scan of the chest.  His dad had coronary artery disease diagnosed at 72 and had bypass.  He wants to visit this with Dr. Einar Gip again       08/31/2021 -video visit to discuss bronchoscopy results from 08/28/2021 and next step in plan    HPI Samuel [name] 66 y.o. -his bronchoscopy with lavage 08/28/2021 shows 40% lymphocytes.  Anything greater than 30% is against UIP.  His RNA genomic classifier is in progress and that would be a specific test although not a ensitive test for UIP.  Bacterial cultures are negative.  He continues have shortness of breath and on room air at rest he is fine but sometimes when he goes to the bathroom he can desaturate into the high 80s.  He is frustrated by his condition.  Our original plan was to ensure no opportunistic or bacterial infections [fully understanding that MTB and fungal infections can take 6 weeks] with the current bronchoscopy and to consider bronchoalveolar lavage.  And then based on this we will schedule pulsed dose steroids.    Recently his G6PD is returned is normal.  He is not on Bactrim for prophylaxis.  Current prednisone Is 53m per day.  He prefers outpatient treatment plan.  We discussed the side effects of high-dose steroids including opportunistic infection, anxiety and irritability, hypertension, diabetes, other lab abnormalities.  Explained the upset benefit of potentially improving upon current ILD active inflammatory phase.  He is willing to go through this treatment.  He prefers outpatient.  He will require lab and vital sign monitoring.   his wife wanted to know if this could be sarcoidosis.  She is read some case reports of sarcoidosis and myeloma associated.  I expressed to them the clinical features do not fit in with sarcoidosis.  However we can check for  autoimmune and sarcoid features  Results for TSHAROD, PETSCH (MRN [id number] as of 08/31/2021 16:38 ENVISIA - NEGATIVE FOR UIP  Ref. Range 06/18/2021 12:57 08/28/2021 16:51  Monocyte-Macrophage-Serous Fluid Latest Ref Range: 50 - 90 % 5 ([name]) 10 ([name])  Other Cells, Fluid Latest Units: % CORRELATE WITH CYTOLOGY. MESOTHELIAL AND BRONCHIAL LINING CELLS  Color, Fluid Latest Ref Range: YELLOW  PINK (A) COLORLESS (A)  Total Nucleated Cell Count, Fluid Latest Ref Range: 0 - 1,000 cu mm 103 183  Fluid Type-FCT Unknown BRONCHIAL ALVEOLAR LAVAGE BRONCHIAL ALVEOLAR LAVAGE  Lymphs, Fluid Latest Units: % 18 40  Eos, Fluid Latest Units: % 51 0  Appearance, Fluid Latest Ref Range: CLEAR  HAZY (A) CLEAR (A)  Neutrophil Count, Fluid Latest Ref Range: 0 - 25 % 26 ([name]) 50 ([name])    CT Chest data  DG CHEST PORT 1 VIEW  Result Date: 08/28/2021 CLINICAL DATA:  Status post bronchoscopy. EXAM: PORTABLE CHEST 1 VIEW COMPARISON:  Chest radiographs 07/20/2021 and CT 07/30/2021 FINDINGS: The cardiac silhouette is borderline enlarged. Lung volumes are chronically low and slightly lower than on the prior radiographs. The interstitial markings are chronically increased diffusely. No definite acute airspace consolidation, overt pulmonary edema, sizable pleural effusion, or pneumothorax is identified. Prominent gaseous distension of the stomach is partially visualized. IMPRESSION: Low lung volumes with chronic interstitial changes. Electronically Signed   By: Logan Bores M.D.   On: 08/28/2021 19:10   DG C-ARM BRONCHOSCOPY  Result Date: 08/28/2021 C-ARM BRONCHOSCOPY: Fluoroscopy was utilized by the requesting physician.  No radiographic interpretation.       OV 09/25/2021  Subjective:  Patient ID: Samuel [name], male , DOB: 01-04-1956 , age 39 y.o. , MRN: [id number] , ADDRESS: [place] [id number] PCP Antony Contras, MD Patient Care Team: Antony Contras, MD as PCP - General (Family  Medicine)  This Provider for this visit: Treatment Team:  Attending Provider: Brand Males, MD    09/25/2021 -   Chief Complaint  Patient presents with   Follow-up    Pt states that he is beginning to feel better after last visit. States he wears his O2 at 2L majority of the time.  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 JNarda Rutherford- 428monce weekly (for multiple myeloma chemotherapy [name]/V ppx)   04/09/21 - dexamethasone 75m775mabs - 40 tabs for 28 day supply - Dr. JohNarda Rutherford62m1mce weekly  (for multiple myeloma chemotherapy [name]/V ppx)   05/07/21 - dexamethasone 75mg 31ms - 40 tabs for 28 day supply - Dr. John Narda Rutherfordmg 62m weekly  (for multiple myeloma chemotherapy [name]/V ppx)   06/06/21 - dexamethasone 75mg ta52m- 40 tabs for 28 day supply - Dr. John DoNarda Rutherfordeased to 20mg on73meekly  06/18/21 - prednisone 10mg tab55m104 tabs for 34 day supply - Dr. Jennifer Dessa Phimg dail75m7 days, 40 mg daily x 7 days, 30 mg daily x 7 days, 20mg daily28m days, 10mg daily 19mdays, 5mg daily x 65mays   07/13/21 - prednisone 10mg tabs - 243mab for 30 day supply - Dr. Teddie Mehta ----Samuel Callerily x 1475mys, 40 mg daily x 14 days, 30mg daily x 1446ms, 20mg daily there49mr  - Mid Nov 2022  - 1gm solumedrol load x 3 days  as outpatient  - 09/25/2021 - 54m pred per day   HPI Samuel GuevaraTosco 66y.o. -returns for follow-up.  Since his last visit we did a loading dose of Solu-Medrol 1 g daily x3 days.  This was in mid November 2022.  After that he is gone back to daily prednisone 20 mg/day.  In the midst of the high-dose steroid he did pick up hypertension and we gave him bisoprolol which he says has helped him significantly.   He is run out of the bisoprolol.  I have asked him to contact his primary care physician to manage his hypertension but we will give him a refill.    He is here for follow-up to see his current status.  He tells me that his effort tolerance is better.  He tells me in the gym he is able to do a little bit more work.  This is compared to a few months ago.  He does tell me that gym exercises are easier on him than climbing the stairs.  Stairs - he avoids and gets dyspneic. Not tested his pulse ox on stairs His subjective symptom profile is slightly better compared to October 2022 but his walking desaturation test is around the same.   So suspect amount of his interstitial lung disease might be better but suspect still remains.  Review of his pulmonary function test from 10 years ago was normal.  In April 2022 he had early ILD.  He currently definitely has ILD.  His RNA genomic classifier is negative.  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  This would make him eligible for nintedanib. He continues on prednisone 20 mg/day.  In terms of his myeloma: He had his wife say that it is still under remission but they are worried about relapse.  They are worried about future direction and treatment of myeloma particularly because he has had issues with treatment that then resulted in acute lung injury.   Of note  - he is frustrated by poor customer service of our office workflows - cChristella Scheuermanndenies his RNA genomic classifer biopsy    OV 10/19/21  [name]: call to give update on conversation with Dr DLorenso Courierhis hematologist  1. Myeloma - latest dec 2022 blood work back - still under remission. Dr DLorenso Courierindicated that highly unlikely he will be a BMT Candidate for myeloma if his lung. [facility] not a [facility] candidate - then cytoxan regimen short term would be used (indicated that is of ptioential benefit to lungs)  2. 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 Would need SLB to ID etiology preciesly but with myelom and Thereapuetic trial with steroids  and his presentation - this has not been a consideratgion till now.  Explained to Dr DLorenso Courierif ILD progresses futher - patient life expectancy is limited. Discussed with patient again and he is agreeable for prednisone 147mper day and starting ofev (awaiting donor samples and insurance proces in 2023).   3. WE discussed that probably [facility] to see if lung transplant would be an option at all if he were to decline esp in  setting of myeloma. Maybe a BMT as well . Do not know answers but wil email [facility]oy [facility] at [facility] to get patient in for visit. He might well need a surgical lung biopsy but this can be addressed in due course  Patient and wife agreeable  I spent time emailing Dr Serita Grit tras[facility]plant doc [facility] - later heard from Dr Doy Mince- feels that Myleoma will need to be in remssion for >= 5 years before he can be considered lung transplant evaluable. They feel no need to see Mr Bautch in transplant clinicl. I subsequetly d/w Mr Capell - will revert back to holding off lung transplant evaluation. He wil proceed with ofev. He will see [facility] BMT team. Will cosndier a [facility] ILD clinic opinion after d;w hi     2nd Pulm Opinion at [facility] - Dr Kathi Ludwig    Comment: The patient seems to have an inflammatory process that has resulted in progressive loss of lung function, worsening hypoxia. Unfortunately on the most recent imaging I do see some signs of fibrosis including a few areas of traction bronchiectasis although I do not see honeycombing or profuse traction bronchiectasis. We did discuss  future therapies. I told him that unfortunately since his lung has already suffered injury I think he would be at risk for developing lung injury again and that many chemotherapeutics have been associated lung injury. I also told him these are idiosyncratic reactions and it was not possible to predict who on an individual basis would develop inflammation from chemotherapy to any specific agent. He brought up Cytoxan I told him that while Cytoxan issues for inflammatory lung disease on the other hand it has been associated with pulmonary inflammation. His inflammatory process is steroid responsive which is not unexpected given that eosinophils were found on BAL.  We will check oxygen assessment with exercise to see how much oxygen as needed with more than ordinary exertion.  Plan:    - Check oxygen assessment  Will discuss with his pulmonary provider Dr. Chase [name] and then Dr. Feliciana Rossetti  Thank you for the opportunity to provide consultation for your patient. If I can be of further assistance please do not hesitate to contact my office.    OV 12/04/2021  Subjective:  Patient ID: Samuel [name], male , DOB: [birth year] , age 78 y.o. 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 JNarda Rutherford- 450monce weekly (for multiple myeloma  chemotherapy [name]/V ppx)   04/09/21 - dexamethasone 72m88mabs - 40 tabs for 28 day supply - Dr. JohNarda Rutherford58m81mce weekly  (for multiple myeloma chemotherapy [name]/V ppx)   05/07/21 - dexamethasone 72mg 32ms - 40 tabs for 28 day supply - Dr. John Narda Rutherfordmg 372m weekly  (for multiple myeloma chemotherapy [name]/V ppx)  06/06/21 - dexamethasone 45m tabs - 40 tabs for 28 day supply - Dr. JNarda Rutherford- decreased to 236monce weekly  06/18/21 - prednisone 1018mabs - 104 tabs for 34 day supply - Dr. JenDessa Phi----56m15mily x 7 days, 40 mg daily x 7 days, 30 mg daily x 7 days, 20mg1mly x 7 days, 10mg 60my x 7 days, 5mg da5m x 7 days   07/13/21 - prednisone 10mg ta71m 296 tab for 30 day supply - Dr. RamaswamChase Caller0mg dai38m 14 days, 40 mg daily x 14 days, 30mg dail68m14 days, 20mg daily18mreafter  - Mid Nov 2022  - 1gm solumedrol load x 3 days as outpatient  - 09/25/2021 - 20mg pred p73may \ -Late December 2022/early January 2023: Start nintedanib - 12/04/21 -prednisone 15mg per day65m/14/2023 -   Chief Complaint  Patient presents with   Follow-up    Pt states he stopped taking the OFEV a week ago due to having problems with diarrhea, nausea, and some bleeding still. States since he stopped taking it, he has not had any diarrhea the past 2 days and states the bleeding stopped 2 days ago.     HPI Samuel J TosThelmer Legler. -retu22s for follow-up with his wife.  He tells me that in terms of his multiple myeloma [facility] and since then is followed with Dr. Dorsey.  He aLorenso Courierw Dr. Rodolfo PascaWallace Goingst [facility]For[facility] Dr. Pascal'[name] noteLetta Kocherarlier this month 2023.  The general feeling that I get is that they are very nervous to do any form of chemotherapy in him for fear of exacerbating his lung disease.  He is extremely worried about this approach.  There is also trepidation about Cytoxan.  He is really worried about  what to do if his myeloma came back.  On the other hand he is also worried about his lungs.  He said he did talk to her known physician call Nathan GreensDarlina Guys [place] use 10 [name] of oxygen with exercise.  He wants higher dose concentrator for this.  I was willing to prescribe this.  He is willing to even pay for this out-of-pocket.  He is also recommended sudden breathing exercises.  Patient is trying different breathing exercises with the hope his lungs can heal.  He is aware that he might be dealing with progressive issues.  He was on nintedanib for a month and early February 2023 he called saying he was having diarrhea and also bloody stools.  He has now stopped nintedanib and for the last week he has not had a diarrhea.  For the last few days there is no bloody stools.  He does not want to do this drug again.  The side effects were quite bad.  There is no further bleeding.  He also believes his hypertension resolved or improved after stopping nintedanib.  He did have a colonoscopy 5 years ago and since then has not had any problems.  His next colonoscopy might be in another 5 years.  At this point in time he is able to do treadmill exercise on 5 [name] and walk 30 minutes 1.7 mph.  Nevertheless when we walked him on room air here in office he desaturated quickly.  It seems like his distance to desaturation is gotten worse.  He is worried about both  the myeloma and the interstitial lung disease.  We discussed options about getting further opinions.  He says he wants to go to a place with his extreme expertise about this.  We discussed the idea about having to go out of state including [place], [place] clinic and the [facility].  I do remember a name of Dr. Stefanie Libel who is professor at [facility] who is an Energy manager in myeloma.  I did mention this name to him.  I have also written to him.  I also written to 1 Dr. Judie Grieve at the pulmonary  department at the [name].  Based on the response we will facilitate a referral.  Meanwhile I did tell him that we need to protect his lungs against fibrosis.  He wants to go down on his prednisone to 10 mg/day because of the side effects of weight gain.  I agreed to do this provisionally.  But I also recommended antifibrotic pirfenidone.  We discussed the side effects of nausea anorexia and occasionally diarrhea.  He wants to reflect on this.  He is willing to meet with the pharmacist on this.  I made a referral.     SYMPTOM SCALE - ILD 08/09/2021 09/25/2021 218# 12/04/2021 222#  Current weight     O2 use ra ra ra  Shortness of Breath 0 -> 5 scale with 5 being worst (score 6 If unable to do)    At rest 0 0 0  Simple tasks - showers, clothes change, eating, shaving _0 Household (dishes, doing bed, laundry) x na 1  Shopping 3 1 1.5  Walking level at own pace _1 Walking up Stairs _2 Total (30-36) Dyspnea Score 14 9 8.5  How bad is your cough? 3 1 0.5  How bad is your fatigue 0 2 0  How bad is nausea 0 0 0  How bad is vomiting?  0 0 0  How bad is diarrhea? 0 0 0  How bad is anxiety? _3 How bad is depression _4 Any chronic pain - if so where and how bad x x x     Simple office walk 185 feet x  3 laps goal with forehead probe 07/12/2021  08/09/2021  09/25/2021  12/04/2021   O2 used ra Ra3 ra ra  Number laps completed 3 3 but did oly _5 attempted but stopped at 2 due to deats  Comments about pace slow slow slow slow  Resting Pulse Ox/HR 100% and 69/min 100%ad 74 98% and 75 99% RA and59  Final Pulse Ox/HR 92% and 81/min 93% and 87 91% and 91 86% RA and 85  Desaturated </= 88% no no no yes  Desaturated <= 3% points Yes 8 Yes, 7 pponts Yes, 7 points Yes, 13 poins  Got Tachycardic >/= 90/min no no yes no  Symptoms at end of test No complaints Mod-severe dyspnea Mild dyspnea No complaints  Miscellaneous comments x Worse? stable ? worse   CT Chest data  No  results found.    PFT  PFT Results Latest Ref Rng & Units 10/16/2021 06/15/2021  FVC-Pre [name] 1.35 1.55  FVC-Predicted Pre % 29 33  FVC-Post [name] - 1.45  FVC-Predicted Post % - 31  Pre FEV1/FVC % % 92 83  Post FEV1/FCV % % - 83  FEV1-Pre [name] 1.24 1.28  FEV1-Predicted Pre % 36 37  FEV1-Post [name] - 1.20  DLCO uncorrected ml/min/mmHg  9.99 12.00  DLCO UNC% % 37 44  DLCO corrected ml/min/mmHg 10.04 12.52  DLCO COR %Predicted % 37 46  DLVA Predicted % 109 118  TLC [name] - 4.18  TLC % Predicted % - 59  RV % Predicted % - 95       has a past medical history of Asthma, GERD (gastroesophageal reflux disease), High cholesterol, History of blood transfusion, Hypothyroidism, Interstitial lung disease (HCC), Multiple myeloma ([place]), Perennial allergic rhinitis, Pneumonia, Sciatic pain, right, Seasonal allergic rhinitis, and Thyroid disease.   reports that he quit smoking about 39 years ago. His smoking use included cigarettes. He has a 0.30 pack-year smoking history. He has never used smokeless tobacco.  Past Surgical History:  Procedure Laterality Date   BRONCHIAL BIOPSY  06/18/2021   Procedure: BRONCHIAL BIOPSIES;  Surgeon: Collene Gobble, MD;  Location: [facility];  Service: Cardiopulmonary;;   BRONCHIAL BIOPSY  08/28/2021   Procedure: BRONCHIAL BIOPSIES;  Surgeon: Garner Nash, DO;  Location: [place];  Service: Cardiopulmonary;;   BRONCHIAL WASHINGS  06/18/2021   Procedure: BRONCHIAL WASHINGS;  Surgeon: Collene Gobble, MD;  [facility];  Service: Cardiopulmonary;;   BRONCHIAL WASHINGS  08/28/2021   Procedure: BRONCHIAL WASHINGS;  Surgeon: Garner Nash, DO;  Location: [place];  Service: Cardiopulmonary;;   NASAL SINUS SURGERY     NECK SURGERY  1996   VIDEO BRONCHOSCOPY [name]/A 06/18/2021   Procedure: VIDEO BRONCHOSCOPY WITH FLUORO;  Surgeon: Collene Gobble, MD;  [facility];  Service: Cardiopulmonary;  Laterality: [name]/A;   VIDEO BRONCHOSCOPY [name]/A 08/28/2021    Procedure: VIDEO BRONCHOSCOPY WITH FLUORO;  Surgeon: Garner Nash, DO;  Location: [place];  Service: Cardiopulmonary;  Laterality: [name]/A;    Allergies  Allergen Reactions   Clarithromycin Other (See Comments)    Hiccups   Ofev [Nintedanib] Other (See Comments)    GI bleeding   Penicillins Other (See Comments)    Child hood    Immunization History  Administered Date([name]) Administered   Fluad Quad(high Dose 65+) 08/09/2021   Influenza Split 10/27/2017   Influenza, High Dose Seasonal PF 12/01/2017, 11/29/2019, 12/19/2020   Influenza, Quadrivalent, Recombinant, Inj, Pf 07/28/2019, 08/11/2020   Influenza-Unspecified 11/26/2011, 07/21/2017   PFIZER(Purple Top)SARS-COV-2 Vaccination 12/27/2019, 01/24/2020, 09/21/2020   Tdap 08/20/2005, 09/07/2015   Zoster, Live 11/06/2017, 05/07/2018    Family History  Problem Relation Age of Onset   Hypertension Mother    Allergies Mother    Allergies Father    CAD Father 5   Breast cancer Paternal Grandmother    Hypertension Other    Heart disease Other      Current Outpatient Medications:    acetaminophen (TYLENOL) 500 MG tablet, Take 1,000 mg by mouth every 6 (six) hours as needed for mild pain, moderate pain or headache., Disp: , Rfl:    acyclovir (ZOVIRAX) 400 MG tablet, Take 1 tablet (400 mg total) by mouth 2 (two) times daily., Disp: 60 tablet, Rfl: 3   albuterol (VENTOLIN HFA) 108 (90 Base) MCG/ACT inhaler, Inhale 2 puffs into the lungs every 6 hours as needed for wheezing or shortness of breath., Disp: 8.5 g, Rfl: 1   ALPRAZolam (XANAX) 1 MG tablet, Take 0.33 mg by mouth at bedtime., Disp: , Rfl:    aspirin EC 81 MG tablet, Take 81 mg by mouth daily. Swallow whole., Disp: , Rfl:    ASSESS FULL RANGE PEAK METER DEVI, as directed., Disp: , Rfl:    bisoprolol (ZEBETA) 5 MG tablet, Take  1 tablet (5 mg total) by mouth daily., Disp: 30 tablet, Rfl: 2   cholecalciferol (VITAMIN D) 1000 UNITS tablet, Take 3,000 Units by mouth daily.,  Disp: , Rfl:    EPINEPHrine (EPI-PEN) 0.3 mg/0.3 mL DEVI, Inject 0.3 mg into the muscle as needed., Disp: , Rfl:    ezetimibe-simvastatin (VYTORIN) 10-40 MG per tablet, Take 1 tablet by mouth at bedtime., Disp: , Rfl:    fluticasone-salmeterol (ADVAIR HFA) 230-21 MCG/ACT inhaler, Inhale 2 puffs into the lungs 2 (two) times daily., Disp: 1 each, Rfl: 12   furosemide (LASIX) 20 MG tablet, Take 1 tablet (20 mg total) by mouth as needed for fluid or edema., Disp: 14 tablet, Rfl: 1   ketoconazole (NIZORAL) 2 % cream, SMARTSIG:1 Application Topical 1 to 2 Times Daily, Disp: , Rfl:    lenalidomide ([name]) 10 MG capsule, TAKE 1 CAPSULE DAILY FOR 14 DAYS, NONE FOR 7 DAYS, Disp: 14 capsule, Rfl: 0   levothyroxine (SYNTHROID) 150 MCG tablet, Take 150 mcg by mouth every other day. Alternate, Disp: , Rfl:    levothyroxine (SYNTHROID) 175 MCG tablet, Take 1 tablet by mouth every other day., Disp: , Rfl:    montelukast (SINGULAIR) 10 MG tablet, Take 10 mg by mouth at bedtime., Disp: , Rfl:    Multiple Vitamin (MULTIVITAMIN) tablet, Take 1 tablet by mouth daily., Disp: , Rfl:    omeprazole (PRILOSEC) 40 MG capsule, Take 1 capsule by mouth daily., Disp: , Rfl:    ondansetron (ZOFRAN) 8 MG tablet, Take 1 tablet (8 mg total) by mouth every 8 (eight) hours as needed for nausea or vomiting., Disp: 30 tablet, Rfl: 0   OXYGEN, Inhale 2 [name] into the lungs continuous., Disp: , Rfl:    predniSONE (DELTASONE) 10 MG tablet, Take 1.5 tablets (15 mg total) by mouth daily with breakfast., Disp: 45 tablet, Rfl: 0   prochlorperazine (COMPAZINE) 10 MG tablet, Take 1 tablet (10 mg total) by mouth every 6 (six) hours as needed for nausea or vomiting., Disp: 30 tablet, Rfl: 0   sildenafil (REVATIO) 20 MG tablet, Take 20 mg by mouth daily as needed (ED)., Disp: , Rfl:    sodium chloride (OCEAN) 0.65 % SOLN nasal spray, Place 1 spray into both nostrils as needed for congestion., Disp: , Rfl:    sulfamethoxazole-trimethoprim (BACTRIM  DS) 800-160 MG tablet, Take 1 tablet by mouth 3 (three) times a week., Disp: 38 tablet, Rfl: 3   tamsulosin (FLOMAX) 0.4 MG CAPS capsule, Take 0.4 mg by mouth daily., Disp: , Rfl:    triamcinolone cream (KENALOG) 0.1 %, Apply 1 application topically daily as needed (sun burn itch)., Disp: , Rfl:       Objective:   Vitals:   12/04/21 1035  BP: 120/68  Pulse: (!) 57  Temp: 98 F (36.7 C)  TempSrc: Oral  SpO2: 99%  Weight: 223 lb 12.8 oz (101.5 kg)  Height: _0  (1.778 m)    Estimated body mass index is 32.11 kg/m as calculated from the following:   Height as of this encounter: _1  (1.778 m).   Weight as of this encounter: 223 lb 12.8 oz (101.5 kg).  _2 @  Filed Weights   12/04/21 1035  Weight: 223 lb 12.8 oz (101.5 kg)     Physical Exam    General: No distress. Looks well Neuro: Alert and Oriented x 3. GCS 15. Speech normal Psych: Pleasant Resp:  Barrel Chest - no.  Wheeze - no, Crackles - no, No overt respiratory  distress CVS: Normal heart sounds. Murmurs - no Ext: Stigmata of Connective Tissue Disease - no HEENT: Normal upper airway. PEERL +. No post nasal drip        Assessment:       ICD-10-CM   1. ILD (interstitial lung disease) ([place])  J84.9 Pulmonary function test    Ambulatory Referral for DME         Plan:     Patient Instructions  ILD (interstitial lung disease) ([place]) Current chronic use of systemic steroids  - will classif you as ILD progressive since summer 2022 though with recent steroid you are better/stable - PFT worse Oct 2022 -> Dec 2022 though you feel better  - ofev caused lot of side effect; diarrhea and GI bleeding  Plan - get spirometry and dlco next few weeks  - visit with Devki to discuss pirfenidone  - continue o2 with exertion as before  -we will do order for 10L concentrator to help you exercise more  - goal pulse ox > 90%  -no more ofev - list as allergy  - continue prednisone but drop from 42m to 11m per your request -continue bactrim 1DS tab three times weekly for infection prevention - [facility] will talk to my contacts Dr IyJudie Griever Dr [facility] some point [facility]Dr DoLorenso [name]- can consider IV cytoxan for your ILD but [facility] [place] [facility] - discussion on surgical lung bx for lung neede but in conjunction with Myeloma treatment   GI bleeding    - stopped after stopping ofev  Plan  - monitor; GI consult if recurs  Hypertension  - improved after stopping ofev  Plan  - no more ofev - PCP addressing BP  Myeloma    - per Dr DoLorenso CourierSteroid side effects  Weight gain  Plan  - monitor weight with reduced steroids  Followup  - 3-4 weeks video or ideally face to face visit   Followup  - next few to several weeks but [facility] PFT and [facility] symptom score and walk test on room air at folowup  -  ( Level 05 visit: Estb 40-54 min in  visit type: on-site physical face to visit  in total care time and counseling or/and coordination of care by this undersigned MD - Dr MuBrand MalesThis includes one or more of the following on this same day 12/04/2021: pre-charting, chart review, note writing, documentation discussion of test results, diagnostic or treatment recommendations, prognosis, risks and benefits of management options, instructions, education, compliance or risk-factor reduction. It excludes time spent by the [facility] staff in the care of the patient. Actual time 5671in)   SIGNATURE    Dr. MuBrand MalesM.D., F.C.C.P,  Pulmonary and Critical Care Medicine Staff Physician, [place] - Interstitial Lung Disease  Program  Pulmonary [place]Pager: [phone] no answer or between  15:00h - 7:00h: call [phone] Telephone: [phone]  5:26 PM 12/04/2021

## 2021-12-05 NOTE — Telephone Encounter (Signed)
Patient is returning phone call. Patient phone number is [phone]. 

## 2021-12-05 NOTE — Telephone Encounter (Signed)
Changed visit to telephone visit. Closing encounter  Knox Saliva, PharmD, MPH, BCPS Clinical Pharmacist (Rheumatology and Pulmonology)

## 2021-12-05 NOTE — Telephone Encounter (Addendum)
Response received from community message sent: RE: 10L concentrator Received: Today Stephannie Peters  Magdalina Whitehead, Waldemar Dickens, CMA; Winnifred Friar, Ferd Hibbs, Leory Plowman I THINK IT WOULD BE A GOOD IDEA TO HAVE HIM DO A 6 MIN WALK TEST, JUST SO WE KNOW WHATS GOING ON WHEN HE IS AT HOME. THANK YOU SO MUCH!       Attempted to call pt but unable to reach. Left message for him to return call.  Patient will need to be scheduled for a full 6 minute walk so we can be able to see exactly what it shows for his O2 levels. During the walk, patient will need to walk a little faster than he usually does so we can be able to get a fill of what happens at home when he exercises if his O2 sats do drop to where he needs more than 2L O2 with exertion.

## 2021-12-05 NOTE — Telephone Encounter (Signed)
Pt was made aware at the office during appt that it might not work for [place] to just put the order in for the 10L concentrator but pt wanted to have it happen anyway just in case.  Community message about this has been sent to [employer]. Will await a response to that message.

## 2021-12-06 ENCOUNTER — Telehealth: Payer: Self-pay | Admitting: Internal Medicine

## 2021-12-06 NOTE — Telephone Encounter (Signed)
Called and left message for him to call office back to get his 6 minute walk schedule to see what is going on with his oxygen levels.

## 2021-12-06 NOTE — Telephone Encounter (Signed)
Patient is scheduled for 30 minute PFT on Monday, 12/10/2021 at 9:30am. Patient would like to know the results as soon as possible.

## 2021-12-07 NOTE — Telephone Encounter (Signed)
For more information, please refer to encounter from 12/04/21 (order issues encounter)  Attempted to call pt but unable to reach. Left message for him to return call.

## 2021-12-10 ENCOUNTER — Other Ambulatory Visit: Payer: Self-pay

## 2021-12-10 ENCOUNTER — Ambulatory Visit (INDEPENDENT_AMBULATORY_CARE_PROVIDER_SITE_OTHER): Payer: Medicare Other | Admitting: Internal Medicine

## 2021-12-10 ENCOUNTER — Telehealth: Payer: Self-pay | Admitting: Pharmacist

## 2021-12-10 ENCOUNTER — Ambulatory Visit (INDEPENDENT_AMBULATORY_CARE_PROVIDER_SITE_OTHER): Payer: Medicare Other | Admitting: Pharmacist

## 2021-12-10 ENCOUNTER — Ambulatory Visit: Payer: Medicare Other

## 2021-12-10 DIAGNOSIS — J849 Interstitial pulmonary disease, unspecified: Secondary | ICD-10-CM | POA: Diagnosis not present

## 2021-12-10 DIAGNOSIS — Z7189 Other specified counseling: Secondary | ICD-10-CM

## 2021-12-10 LAB — PULMONARY FUNCTION TEST
DL/VA % pred: 97 %
DL/VA: 4.04 ml/min/mmHg/L
DLCO cor % pred: 34 %
DLCO cor: 9.2 ml/min/mmHg
DLCO unc % pred: 32 %
DLCO unc: 8.64 ml/min/mmHg
FEF 25-75 Pre: 3.23 L/sec
FEF2575-%Pred-Pre: 118 %
FEV1-%Pred-Pre: 40 %
FEV1-Pre: 1.4 L
FEV1FVC-%Pred-Pre: 125 %
FEV6-%Pred-Pre: 34 %
FEV6-Pre: 1.49 L
FEV6FVC-%Pred-Pre: 105 %
FVC-%Pred-Pre: 32 %
FVC-Pre: 1.49 L
Pre FEV1/FVC ratio: 94 %
Pre FEV6/FVC Ratio: 100 %

## 2021-12-10 NOTE — Progress Notes (Signed)
Subjective:  Patient called today by [facility] pharmacy team for Esbriet new start.   Patient was last seen and referred by Dr. [name] on 12/04/21.  Pertinent past medical history includes ILD with progressive phenotype, possible asthma, allergic rhinitis. He has multiple myeloma. 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 Chemotherapy has been held d/t rec for further pulmonology workup  He trialed Ofev but has GI bleeding. He states it has mostly resolved.  History of elevated LFTs: No History of diarrhea, nausea, vomiting: No  Objective: Allergies  Allergen Reactions   Clarithromycin Other (See Comments)    Hiccups   Ofev [Nintedanib] Other (See Comments)    GI bleeding   Penicillins Other (See Comments)    Child hood    Outpatient Encounter Medications as of 12/10/2021  Medication Sig Note   acetaminophen (TYLENOL) 500 MG tablet Take 1,000 mg by mouth every 6 (six) hours as needed for mild pain, moderate pain or headache.    acyclovir (ZOVIRAX) 400 MG tablet Take 1 tablet (400 mg total) by mouth 2 (two) times daily.    albuterol (VENTOLIN HFA) 108 (90 Base) MCG/ACT inhaler Inhale 2 puffs into the lungs every 6 hours as needed for wheezing or shortness of breath.    ALPRAZolam (XANAX) 1 MG tablet Take 0.33 mg by mouth at bedtime.    aspirin EC 81 MG tablet Take 81 mg by mouth daily. Swallow whole.    ASSESS FULL RANGE PEAK METER DEVI as directed.    bisoprolol (ZEBETA) 5 MG tablet Take 1 tablet (5 mg total) by mouth daily.    cholecalciferol (VITAMIN D) 1000 UNITS tablet Take 3,000 Units by mouth daily.    EPINEPHrine (EPI-PEN) 0.3 mg/0.3 mL DEVI Inject 0.3 mg into the muscle as needed.    ezetimibe-simvastatin (VYTORIN) 10-40 MG per tablet Take 1 tablet by mouth at bedtime.    fluticasone-salmeterol (ADVAIR HFA) 230-21 MCG/ACT inhaler Inhale 2  puffs into the lungs 2 (two) times daily.    furosemide (LASIX) 20 MG tablet Take 1 tablet (20 mg total) by mouth as needed for fluid or edema.    ketoconazole (NIZORAL) 2 % cream SMARTSIG:1 Application Topical 1 to 2 Times Daily    lenalidomide ([name]) 10 MG capsule TAKE 1 CAPSULE DAILY FOR 14 DAYS, NONE FOR 7 DAYS 08/22/2021: On hold   levothyroxine (SYNTHROID) 150 MCG tablet Take 150 mcg by mouth every other day. Alternate    levothyroxine (SYNTHROID) 175 MCG tablet Take 1 tablet by mouth every other day.    montelukast (SINGULAIR) 10 MG tablet Take 10 mg by mouth at bedtime.    Multiple Vitamin (MULTIVITAMIN) tablet Take 1 tablet by mouth daily.    omeprazole (PRILOSEC) 40 MG capsule Take 1 capsule by mouth daily.    ondansetron (ZOFRAN) 8 MG tablet Take 1 tablet (8 mg total) by mouth every 8 (eight) hours as needed for nausea or vomiting.    OXYGEN Inhale 2 L into the lungs continuous.    predniSONE (DELTASONE) 10 MG tablet Take 1.5 tablets (15 mg total) by mouth daily with breakfast.    prochlorperazine (COMPAZINE) 10 MG tablet Take 1 tablet (10 mg total) by mouth every 6 (six) hours as needed for nausea or vomiting.    sildenafil (REVATIO) 20 MG tablet Take 20 mg by mouth daily as needed (ED).    sodium chloride (OCEAN) 0.65 % SOLN nasal spray Place  1 spray into both nostrils as needed for congestion.    sulfamethoxazole-trimethoprim (BACTRIM DS) 800-160 MG tablet Take 1 tablet by mouth 3 (three) times a week.    tamsulosin (FLOMAX) 0.4 MG CAPS capsule Take 0.4 mg by mouth daily.    triamcinolone cream (KENALOG) 0.1 % Apply 1 application topically daily as needed (sun burn itch).    No facility-administered encounter medications on file as of 12/10/2021.     Immunization History  Administered Date(s) Administered   Fluad Quad(high Dose 65+) 08/09/2021   Influenza Split 10/27/2017   Influenza, High Dose Seasonal PF 12/01/2017, 11/29/2019, 12/19/2020   Influenza, Quadrivalent,  Recombinant, Inj, Pf 07/28/2019, 08/11/2020   Influenza-Unspecified 11/26/2011, 07/21/2017   PFIZER(Purple Top)SARS-COV-2 Vaccination 12/27/2019, 01/24/2020, 09/21/2020   Tdap 08/20/2005, 09/07/2015   Zoster, Live 11/06/2017, 05/07/2018      PFT's TLC  Date Value Ref Range Status  06/15/2021 4.18 L Final      CMP     Component Value Date/Time   NA 130 (L) 11/30/2021 1052   K 4.5 11/30/2021 1052   CL 96 (L) 11/30/2021 1052   CO2 29 11/30/2021 1052   GLUCOSE 96 11/30/2021 1052   BUN 16 11/30/2021 1052   CREATININE 0.94 11/30/2021 1052   CALCIUM 9.1 11/30/2021 1052   PROT 7.2 11/30/2021 1052   ALBUMIN 4.1 11/30/2021 1052   AST 23 11/30/2021 1052   ALT 23 11/30/2021 1052   ALKPHOS 31 (L) 11/30/2021 1052   BILITOT 0.4 11/30/2021 1052   GFRNONAA >60 11/30/2021 1052      CBC    Component Value Date/Time   WBC 12.5 (H) 11/30/2021 1052   WBC 10.7 (H) 09/27/2021 0929   RBC 3.78 (L) 11/30/2021 1052   HGB 12.6 (L) 11/30/2021 1052   HCT 36.6 (L) 11/30/2021 1052   PLT 198 11/30/2021 1052   MCV 96.8 11/30/2021 1052   MCH 33.3 11/30/2021 1052   MCHC 34.4 11/30/2021 1052   RDW 13.4 11/30/2021 1052   LYMPHSABS 0.7 11/30/2021 1052   MONOABS 0.5 11/30/2021 1052   EOSABS 0.0 11/30/2021 1052   BASOSABS 0.0 11/30/2021 1052    LFT's Hepatic Function Latest Ref Rng & Units 11/30/2021 11/12/2021 10/10/2021  Total Protein 6.5 - 8.1 g/dL 7.2 7.6 7.3  Albumin 3.5 - 5.0 g/dL 4.1 4.3 4.1  AST 15 - 41 U/L 23 33 20  ALT 0 - 44 U/L _0 Alk Phosphatase 38 - 126 U/L 31(L) 34(L) 36(L)  Total Bilirubin 0.3 - 1.2 mg/dL 0.4 0.7 0.4    HRCT (07/31/21) -  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  Assessment and Plan  Esbriet Medication Management Thoroughly counseled patient on the efficacy, mechanism of action, dosing, administration, adverse effects, and monitoring parameters of Esbriet.  Patient verbalized understanding. Patient education handout provided.    Goals of Therapy: Will not stop or reverse the progression of ILD. It will slow the progression of ILD.   Dosing: Starting dose will be Esbriet 267 mg 1 tablet three times daily for 7 days, then 2 tablets three times daily for 7 days, then 3 tablets three times daily.  Maintenance dose will be 801 mg 1 tablet three times daily if tolerated.  Stressed the importance of taking with meals and space at least 5-6 hours apart to minimize stomach upset.   Adverse Effects: Nausea, vomiting, diarrhea, weight loss Abdominal pain GERD Sun sensitivity/rash - patient advised to wear sunscreen when exposed to sunlight. They go golfing  in [place] Fatigue  Monitoring: Monitor for diarrhea, nausea and vomiting, GI perforation, hepatotoxicity  Monitor LFTs - baseline, monthly for first 6 months, then every 3 months routinely  Access: Patient aware that pirfenidone may initially be denied due to non-IPF diagnosis. Advised this may prolong approval process since he does not qualify for patient assistance d/t income. They are awaiting referral to [facility] for second opinion. I advised that it is reasonable to hold on starting Esbriet if they have appt with [facility] before or around the time of pirfendone approval in case they have different recommendations for course of treatment.  Medication Reconciliation A drug regimen assessment was performed, including review of allergies, interactions, disease-state management, dosing and immunization history. Medications were reviewed with the patient, including name, instructions, indication, goals of therapy, potential side effects, importance of adherence, and safe use.    This appointment required 30 minutes of patient care (this includes precharting, chart review, review of results, face-to-face care, etc.).  Knox Saliva, PharmD, MPH, BCPS Clinical Pharmacist (Rheumatology and Pulmonology)

## 2021-12-10 NOTE — Telephone Encounter (Signed)
Please start pirfenidone BIV.  Dose with 267mg  tabs: Take 1 tab three times daily for 7 days, then 2 tabs three times daily for 7 days, then 3 tabs three times daily thereafter.  Dx: ILD with progressive phenotype (J84.170)  Previously tried therapies: Ofev - recurrent GI bleeding  Patient exceeds income threshold for PAP.  Knox Saliva, PharmD, MPH, BCPS Clinical Pharmacist (Rheumatology and Pulmonology)

## 2021-12-10 NOTE — Progress Notes (Signed)
Spirometry and DLCO performed today. °

## 2021-12-10 NOTE — Patient Instructions (Signed)
Spirometry/DLCO performed today. 

## 2021-12-10 NOTE — Telephone Encounter (Signed)
PFTs stable - very slight wors since dec 2022. Definitely not dramatically worse as was Aug 2022-> Dec 2022. So, in that sense is a reprieve.  Will talk to Dr [name] next 48h and get back to him. Meanwhile, please make [facility] referral to ILD clinic and myeloma clinic.    Please send note back to me  Thanks    SIGNATURE    Dr. Brand Males, M.D., F.C.C.P,  Pulmonary and Critical Care Medicine Staff Physician, [place] Director - Interstitial Lung Disease  Program  Pulmonary [place] at [place], [place], [address]  NPI Number:  NPI #[id number] DEA Number: [id number]  Pager: [phone], If no answer  -> Check AMION or Try [phone] Telephone (clinical office): [phone] Telephone (research): [phone]  11:06 AM 12/10/2021   PFT Results Latest Ref Rng & Units 12/10/2021 10/16/2021 06/15/2021  FVC-Pre L 1.49 1.35 1.55  FVC-Predicted Pre % 32 29 33  FVC-Post L - - 1.45  FVC-Predicted Post % - - 31  Pre FEV1/FVC % % 94 92 83  Post FEV1/FCV % % - - 83  FEV1-Pre L 1.40 1.24 1.28  FEV1-Predicted Pre % 40 36 37  FEV1-Post L - - 1.20  DLCO uncorrected ml/min/mmHg 8.64 9.99 12.00  DLCO UNC% % 32 37 44  DLCO corrected ml/min/mmHg 9.20 10.04 12.52  DLCO COR %Predicted % 34 37 46  DLVA Predicted % 97 109 118  TLC L - - 4.18  TLC % Predicted % - - 59  RV % Predicted % - - 95

## 2021-12-11 ENCOUNTER — Encounter: Payer: Self-pay | Admitting: Hematology and Oncology

## 2021-12-11 ENCOUNTER — Other Ambulatory Visit: Payer: Self-pay | Admitting: Internal Medicine

## 2021-12-11 ENCOUNTER — Other Ambulatory Visit (HOSPITAL_COMMUNITY): Payer: Self-pay

## 2021-12-11 ENCOUNTER — Ambulatory Visit: Payer: Medicare Other

## 2021-12-11 DIAGNOSIS — J849 Interstitial pulmonary disease, unspecified: Secondary | ICD-10-CM

## 2021-12-11 DIAGNOSIS — R0902 Hypoxemia: Secondary | ICD-10-CM

## 2021-12-11 DIAGNOSIS — Z8579 Personal history of other malignant neoplasms of lymphoid, hematopoietic and related tissues: Secondary | ICD-10-CM

## 2021-12-11 NOTE — Telephone Encounter (Signed)
Attempted to call pt but unable to reach. Left message for him to return call. °

## 2021-12-11 NOTE — Telephone Encounter (Signed)
Spoke with the pt and his spouse while in office for walk test  I made them aware of the PFT results and pt verbalized understanding  Pt agreeable to the referral to [facility] clinic and I have placed this, as well as new o2 order so he can get 10 liter unti

## 2021-12-11 NOTE — Telephone Encounter (Signed)
Multiple attempts to submit PA request through CoverMyMeds have failed, error message states that [employer] is not able to process request through ePA (despite having previously submitted PA for Ofev).   Test claim attempts also fail, stating that pt has other primary insurance (despite previous successful test claims).    Will contact pt's plan for f/u and PA submission.  Phone# [phone] Fax# [phone]

## 2021-12-12 NOTE — Telephone Encounter (Signed)
Received a fax regarding Prior Authorization from [place] for PIRFENIDONE. Authorization has been DENIED because patient does not have a diagnosis of Idiopathic Pulmonary Fibrosis.  Appeal Phone# [phone]

## 2021-12-21 ENCOUNTER — Encounter: Payer: Self-pay | Admitting: Hematology and Oncology

## 2021-12-28 NOTE — Telephone Encounter (Signed)
Submitted an URGENT appeal to [facility] for PIRFENIDONE. ? ?Phone: [phone] ?Fax: [phone] ? ?Knox Saliva, PharmD, MPH, BCPS ?Clinical Pharmacist (Rheumatology and Pulmonology) ? ?

## 2021-12-31 ENCOUNTER — Telehealth: Payer: Self-pay | Admitting: *Deleted

## 2021-12-31 ENCOUNTER — Other Ambulatory Visit (HOSPITAL_COMMUNITY): Payer: Self-pay

## 2021-12-31 NOTE — Telephone Encounter (Signed)
Received call from pt. He states he will be going to the [facility] in [place]. He will be driving with his wife, leaving on March 27th. He will have pulmonary testing done on the 29th. He will see the pulmonary physician on the 30th and he is also seeing an Oncologist in the afternoon of March 30th.  He wanted Dr. [name] to know in case they call for any of his records. Advised that we will provide whatever they need if they call.  Samuel [name] was very appreciative. ?

## 2021-12-31 NOTE — Telephone Encounter (Signed)
Received notification from [facility] regarding a prior authorization for PIRFENIDONE. Authorization has been APPROVED from 10/30/21 to 12/29/22.  ? ?Per test claim, copay for 30 days supply is $216.12 ? ?Patient can fill through [facility]: [phone]  ? ?ATC patient to review but unable to reach. Left VM requesting return call ? ?Phone # [phone]  ? ?Knox Saliva, PharmD, MPH, BCPS ?Clinical Pharmacist (Rheumatology and Pulmonology) ?

## 2022-01-01 ENCOUNTER — Telehealth: Payer: Self-pay | Admitting: Internal Medicine

## 2022-01-02 NOTE — Telephone Encounter (Signed)
Spoke to patient.  ?He stated that he spoke to [facility] clinic yesterday and confirmed that they did received records, however they did not mention which practice sent records. He requested that I verify with Dr. [name] that records were faxed.  ? ?Please advise. Thanks ?

## 2022-01-02 NOTE — Telephone Encounter (Signed)
If the referral was placed by our office, [facility] would send records from his visits with our office to [facility]. ?

## 2022-01-02 NOTE — Telephone Encounter (Signed)
1) he needs to take CD ROM of all his CT scans with him. Please favilitate ? ?2) pls ask Raquel Sarna if our records were sent ? ?3) I have also emailed dr Vivien Rota secretary to ensure they got our records ? ?Thanks ? ? ? ?SIGNATURE  ? ? ?Dr. Brand Males, M.D., F.C.C.P,  ?Pulmonary and Critical Care Medicine ?Staff Physician, [place] ?Center Director - Interstitial Lung Disease  Program  ?Pulmonary [place] [facility] [place] [facility] ?[place], [place], [address] ? ?NPI Number:  NPI #[id number] ?DEA Number: [id number] ? ?Pager: [phone], If no answer  -> Check AMION or Try [phone] ?Telephone (clinical office): [phone] ?Telephone (research): [phone] ? ?9:30 AM ?01/02/2022 ? ?

## 2022-01-03 NOTE — Telephone Encounter (Signed)
Spoke to patient and relayed below message. ?Patient will contact [facility] radiology for disc.  ?Nothing further needed.  ? ?

## 2022-01-03 NOTE — Telephone Encounter (Signed)
Lm for patient.  

## 2022-01-04 ENCOUNTER — Other Ambulatory Visit: Payer: Self-pay | Admitting: Hematology and Oncology

## 2022-01-06 ENCOUNTER — Encounter (HOSPITAL_BASED_OUTPATIENT_CLINIC_OR_DEPARTMENT_OTHER): Payer: Self-pay | Admitting: *Deleted

## 2022-01-06 ENCOUNTER — Emergency Department (HOSPITAL_BASED_OUTPATIENT_CLINIC_OR_DEPARTMENT_OTHER)
Admission: EM | Admit: 2022-01-06 | Discharge: 2022-01-06 | Disposition: A | Discharge status: Home / Self Care | Payer: Medicare Other | Attending: Emergency Medicine | Admitting: Emergency Medicine

## 2022-01-06 ENCOUNTER — Emergency Department (HOSPITAL_BASED_OUTPATIENT_CLINIC_OR_DEPARTMENT_OTHER): Payer: Medicare Other

## 2022-01-06 ENCOUNTER — Other Ambulatory Visit: Payer: Self-pay

## 2022-01-06 DIAGNOSIS — M546 Pain in thoracic spine: Secondary | ICD-10-CM | POA: Insufficient documentation

## 2022-01-06 DIAGNOSIS — E039 Hypothyroidism, unspecified: Secondary | ICD-10-CM | POA: Diagnosis not present

## 2022-01-06 DIAGNOSIS — R079 Chest pain, unspecified: Secondary | ICD-10-CM | POA: Insufficient documentation

## 2022-01-06 DIAGNOSIS — Z8579 Personal history of other malignant neoplasms of lymphoid, hematopoietic and related tissues: Secondary | ICD-10-CM | POA: Insufficient documentation

## 2022-01-06 DIAGNOSIS — J45909 Unspecified asthma, uncomplicated: Secondary | ICD-10-CM | POA: Diagnosis not present

## 2022-01-06 DIAGNOSIS — Z87891 Personal history of nicotine dependence: Secondary | ICD-10-CM | POA: Insufficient documentation

## 2022-01-06 LAB — CBC
HCT: 37.8 % — ABNORMAL LOW (ref 39.0–52.0)
Hemoglobin: 12.8 g/dL — ABNORMAL LOW (ref 13.0–17.0)
MCH: 32.9 pg (ref 26.0–34.0)
MCHC: 33.9 g/dL (ref 30.0–36.0)
MCV: 97.2 fL (ref 80.0–100.0)
Platelets: 266 10*3/uL (ref 150–400)
RBC: 3.89 MIL/uL — ABNORMAL LOW (ref 4.22–5.81)
RDW: 12.7 % (ref 11.5–15.5)
WBC: 6.6 10*3/uL (ref 4.0–10.5)
nRBC: 0 % (ref 0.0–0.2)

## 2022-01-06 LAB — BASIC METABOLIC PANEL
Anion gap: 8 (ref 5–15)
BUN: 20 mg/dL (ref 8–23)
CO2: 27 mmol/L (ref 22–32)
Calcium: 10 mg/dL (ref 8.9–10.3)
Chloride: 98 mmol/L (ref 98–111)
Creatinine, Ser: 0.94 mg/dL (ref 0.61–1.24)
GFR, Estimated: >60 mL/min (ref >60)
Glucose, Bld: 103 mg/dL — ABNORMAL HIGH (ref 70–99)
Potassium: 3.9 mmol/L (ref 3.5–5.1)
Sodium: 133 mmol/L — ABNORMAL LOW (ref 135–145)

## 2022-01-06 LAB — TROPONIN I (HIGH SENSITIVITY)
Troponin I (High Sensitivity): 4 ng/L (ref <18)
Troponin I (High Sensitivity): 5 ng/L (ref <18)

## 2022-01-06 MED ORDER — FENTANYL CITRATE PF 50 MCG/ML IJ SOSY
50.0000 ug | PREFILLED_SYRINGE | Freq: Once | INTRAMUSCULAR | Status: AC
  Administered 2022-01-06: 50 ug via INTRAVENOUS
  Filled 2022-01-06: qty 1

## 2022-01-06 MED ORDER — SIMETHICONE 180 MG PO CAPS: 180.0000 mg | ORAL_CAPSULE | Freq: Three times a day (TID) | ORAL | 0 refills | Status: DC | PRN

## 2022-01-06 MED ORDER — IOHEXOL 350 MG/ML SOLN
100.0000 mL | Freq: Once | INTRAVENOUS | Status: AC | PRN
  Administered 2022-01-06: 80 mL via INTRAVENOUS

## 2022-01-06 MED ORDER — FENTANYL CITRATE PF 50 MCG/ML IJ SOSY
25.0000 ug | PREFILLED_SYRINGE | Freq: Once | INTRAMUSCULAR | Status: AC
  Administered 2022-01-06: 25 ug via INTRAVENOUS
  Filled 2022-01-06: qty 1

## 2022-01-06 MED ORDER — SIMETHICONE 40 MG/0.6ML PO SUSP (UNIT DOSE)
40.0000 mg | Freq: Once | ORAL | Status: AC
  Administered 2022-01-06: 40 mg via ORAL
  Filled 2022-01-06: qty 0.6

## 2022-01-06 NOTE — Discharge Instructions (Signed)
You were evaluated in the Emergency Department and after careful evaluation, we did not find any emergent condition requiring admission or further testing in the hospital. ? ?Your exam/testing today was overall reassuring.  No evidence of blood clots or heart strain/damage.  Recommend following up with your regular doctors, can use the simethicone as needed. ? ?Please return to the Emergency Department if you experience any worsening of your condition.  Thank you for allowing [place] to be a part of your care. ? ?

## 2022-01-06 NOTE — ED Triage Notes (Signed)
Pt is here for chest pain which began around 10pm last night.  Pt states that the chest still hurts but not as much as his back. Pt is on home O2 at 2L [place] and has sob all the time and there is no change.   ?

## 2022-01-06 NOTE — ED Provider Notes (Signed)
?[facility] ?[facility] Emergency Department ?Provider Note ?MRN:  [id number]  ?Arrival date & time: 01/06/22    ? ?Chief Complaint   ?Chest Pain ?  ?History of Present Illness   ?Samuel [name] is a 66 y.o. year-old male with a history of interstitial lung disease, multiple myeloma presenting to the ED with chief complaint of chest pain. ? ?Chest pain earlier today that has since moved to his right thoracic back.  Somewhat worse with deep breaths. ? ?Review of Systems  ?A thorough review of systems was obtained and all systems are negative except as noted in the HPI and PMH.  ? ?Patient's Health History   ? ?Past Medical History:  ?Diagnosis Date  ? Asthma   ? GERD (gastroesophageal reflux disease)   ? High cholesterol   ? under control.   ? History of blood transfusion   ? Hypothyroidism   ? Interstitial lung disease ([place])   ? Multiple myeloma ([place])   ? Perennial allergic rhinitis   ? Pneumonia   ? Sciatic pain, right   ? Seasonal allergic rhinitis   ? Thyroid disease   ?  ?Past Surgical History:  ?Procedure Laterality Date  ? BRONCHIAL BIOPSY  06/18/2021  ? Procedure: BRONCHIAL BIOPSIES;  Surgeon: Collene Gobble, MD;  Location: [facility];  Service: Cardiopulmonary;;  ? BRONCHIAL BIOPSY  08/28/2021  ? Procedure: BRONCHIAL BIOPSIES;  Surgeon: Garner Nash, DO;  Location: [place];  Service: Cardiopulmonary;;  ? BRONCHIAL WASHINGS  06/18/2021  ? Procedure: BRONCHIAL WASHINGS;  Surgeon: Collene Gobble, MD;  Location: [facility];  Service: Cardiopulmonary;;  ? BRONCHIAL WASHINGS  08/28/2021  ? Procedure: BRONCHIAL WASHINGS;  Surgeon: Garner Nash, DO;  Location: [place];  Service: Cardiopulmonary;;  ? NASAL SINUS SURGERY    ? NECK SURGERY  1996  ? VIDEO BRONCHOSCOPY N/A 06/18/2021  ? Procedure: VIDEO BRONCHOSCOPY WITH FLUORO;  Surgeon: Collene Gobble, MD;  Location: [facility];  Service: Cardiopulmonary;  Laterality: N/A;  ? VIDEO BRONCHOSCOPY N/A 08/28/2021  ? Procedure: VIDEO  BRONCHOSCOPY WITH FLUORO;  Surgeon: Garner Nash, DO;  Location: [place];  Service: Cardiopulmonary;  Laterality: N/A;  ?  ?Family History  ?Problem Relation Age of Onset  ? Hypertension Mother   ? Allergies Mother   ? Allergies Father   ? CAD Father 31  ? Breast cancer Paternal Grandmother   ? Hypertension Other   ? Heart disease Other   ?  ?Social History  ? ?Socioeconomic History  ? Marital status: Married  ?  Spouse name: Not on file  ? Number of children: 3  ? Years of education: Not on file  ? Highest education level: Not on file  ?Occupational History  ? Not on file  ?Tobacco Use  ? Smoking status: Former  ?  Packs/day: 0.10  ?  Years: 3.00  ?  Pack years: 0.30  ?  Types: Cigarettes  ?  Quit date: 10/21/1982  ?  Years since quitting: 39.2  ? Smokeless tobacco: Never  ?Vaping Use  ? Vaping Use: Never used  ?Substance and Sexual Activity  ? Alcohol use: Not Currently  ?  Comment: 1 drink daily  ? Drug use: No  ? Sexual activity: Not on file  ?Other Topics Concern  ? Not on file  ?Social History Narrative  ? Not on file  ? ?Social Determinants of Health  ? ?Financial Resource Strain: Not on file  ?Food Insecurity: Not on file  ?Transportation Needs:  Not on file  ?Physical Activity: Not on file  ?Stress: Not on file  ?Social Connections: Not on file  ?Intimate Partner Violence: Not on file  ?  ? ?Physical Exam  ? ?Vitals:  ? 01/06/22 0141 01/06/22 0243  ?BP: (!) 149/95 (!) 158/88  ?Pulse: (!) 55 (!) 50  ?Resp: 16 14  ?Temp: 97.8 ?F (36.6 ?C)   ?SpO2: 100% 99%  ?  ?CONSTITUTIONAL: Well-appearing, NAD ?NEURO/PSYCH:  Alert and oriented x 3, no focal deficits ?EYES:  eyes equal and reactive ?ENT/NECK:  no LAD, no JVD ?CARDIO: Regular rate, well-perfused, normal S1 and S2 ?PULM:  CTAB no wheezing or rhonchi ?GI/GU:  non-distended, non-tender ?MSK/SPINE:  No gross deformities, no edema ?SKIN:  no rash, atraumatic ? ? ?*Additional and/or pertinent findings included in MDM below ? ?Diagnostic and Interventional  Summary  ? ? EKG Interpretation ? ?Date/Time:  Sunday January 06 2022 01:38:04 EDT ?Ventricular Rate:  54 ?PR Interval:  192 ?QRS Duration: 102 ?QT Interval:  418 ?QTC Calculation: 396 ?R Axis:   -20 ?Text Interpretation: Sinus bradycardia Moderate voltage criteria for LVH, may be normal variant ( R in aVL , Cornell product ) Borderline ECG When compared with ECG of 15-Jun-2021 14:18, PREVIOUS ECG IS PRESENT Confirmed by Gerlene Fee [phone]) on 01/06/2022 2:03:29 AM ?  ? ?  ? ?Labs Reviewed  ?BASIC METABOLIC PANEL - Abnormal; Notable for the following components:  ?    Result Value  ? Sodium 133 (*)   ? Glucose, Bld 103 (*)   ? All other components within normal limits  ?CBC - Abnormal; Notable for the following components:  ? RBC 3.89 (*)   ? Hemoglobin 12.8 (*)   ? HCT 37.8 (*)   ? All other components within normal limits  ?TROPONIN I (HIGH SENSITIVITY)  ?TROPONIN I (HIGH SENSITIVITY)  ?  ?CT Angio Chest Pulmonary Embolism (PE) W or WO Contrast  ?Final Result  ?  ?  ?Medications  ?fentaNYL (SUBLIMAZE) injection 25 mcg (has no administration in time range)  ?simethicone (MYLICON) 40 UJ/8.1XB suspension 40 mg (has no administration in time range)  ?fentaNYL (SUBLIMAZE) injection 50 mcg (50 mcg Intravenous Given 01/06/22 0230)  ?iohexol (OMNIPAQUE) 350 MG/ML injection 100 mL (80 mLs Intravenous Contrast Given 01/06/22 0245)  ?  ? ?Procedures  /  Critical Care ?Procedures ? ?ED Course and Medical Decision Making  ?Initial Impression and Ddx ?Differential diagnosis includes PE, aortic dissection, ACS.  Will need CTA.  Patient sitting comfortably, other considerations include MSK, pleurisy, patient explains that this does happen from time to time he thinks it is due to "air trapping" from GI gas. ? ?Past medical/surgical history that increases complexity of ED encounter: History of multiple myeloma with treatment complicated by interstitial lung disease, uses 2 L nasal cannula chronically. ? ?Interpretation of  Diagnostics ?I personally reviewed the EKG and my interpretation is as follows: Sinus bradycardia ?   ?Labs are reassuring with no significant blood count or electrolyte disturbance, troponin is negative x2. ? ?CT is negative for PE.  The aorta appears normal with no signs of dissection. ? ?Patient Reassessment and Ultimate Disposition/Management ?Patient appropriate for discharge home, strict return precautions. ? ?Patient management required discussion with the following services or consulting groups:  None ? ?Complexity of Problems Addressed ?Acute illness or injury that poses threat of life of bodily function ? ?Additional Data Reviewed and Analyzed ?Further history obtained from: ?Further history from spouse/family member ? ?Additional Factors Impacting ED Encounter  Risk ?Prescriptions ? ?Barth Kirks. Sedonia Small, MD ?[facility] Emergency Medicine ?[email or web address] ? ?Final Clinical Impressions(s) / ED Diagnoses  ? ?  ICD-10-CM   ?1. Acute right-sided thoracic back pain  M54.6   ?  ?2. Chest pain, unspecified type  R07.9   ?  ?  ?ED Discharge Orders   ? ?      Ordered  ?  Simethicone (SIMETHICONE ULTRA STRENGTH) 180 MG CAPS  3 times daily PRN       ? 01/06/22 0429  ? ?  ?  ? ?  ?  ? ?Discharge Instructions Discussed with and Provided to Patient:  ? ? ?Discharge Instructions   ? ?  ?You were evaluated in the Emergency Department and after careful evaluation, we did not find any emergent condition requiring admission or further testing in the hospital. ? ?Your exam/testing today was overall reassuring.  No evidence of blood clots or heart strain/damage.  Recommend following up with your regular doctors, can use the simethicone as needed. ? ?Please return to the Emergency Department if you experience any worsening of your condition.  Thank you for allowing [place] to be a part of your care. ? ? ? ? ?  ?Maudie Flakes, MD ?01/06/22 [phone] ? ?

## 2022-01-07 ENCOUNTER — Telehealth: Payer: Self-pay | Admitting: Primary Care

## 2022-01-07 MED ORDER — ADVAIR HFA 230-21 MCG/ACT IN AERO: 2.0000 | INHALATION_SPRAY | Freq: Two times a day (BID) | RESPIRATORY_TRACT | 12 refills | Status: DC

## 2022-01-07 NOTE — Telephone Encounter (Signed)
Fluticasone/salm not on insurance formulary ?List of alternatives given were Symbicort, Advair diskus, Advair HFA, Breo Ellipta  ?Rx for Advair HFA 230-21 brand name only sent to pharmacy on file  ?

## 2022-01-09 NOTE — Telephone Encounter (Signed)
He can have ti and do 1 tab tid till seen at [facility] or can  have it and wait till seen by [facility]. They do not want side effects during travel. So, I gave them tese 2 options ?

## 2022-01-10 ENCOUNTER — Other Ambulatory Visit: Payer: Self-pay | Admitting: Hematology and Oncology

## 2022-01-14 ENCOUNTER — Ambulatory Visit: Payer: BLUE CROSS/BLUE SHIELD | Admitting: Internal Medicine

## 2022-01-15 ENCOUNTER — Ambulatory Visit: Payer: BLUE CROSS/BLUE SHIELD | Admitting: Internal Medicine

## 2022-01-15 NOTE — Telephone Encounter (Signed)
ATC patient regarding pirfenidone authorization being approved and Dr. Golden Pop low dose recommendations. ? ?Unable to reach - left VM on patient's wife phone. Requested return call ? ?Samuel [name], PharmD, MPH, BCPS ?Clinical Pharmacist (Rheumatology and Pulmonology) ?

## 2022-01-17 NOTE — Telephone Encounter (Signed)
Spoke with patient's wife. She states they have met with [facility] and patient is taking prednisone x 6 weeks. They were advised by [facility] to not start pirfenidone at this time and continue on prednisone.  He will be back in town at the end of May 2023 and requested call at the beginning of June 2023. ? ?Routing to Dr. Ramaswamy as FYI ? ?Devki Gajera, PharmD, MPH, BCPS ?Clinical Pharmacist (Rheumatology and Pulmonology) ? ?

## 2022-01-23 NOTE — Telephone Encounter (Signed)
I d/w Dr Ladon Applebaum at [facility] - he does not want too many drug intereactions . So he preferred no esbriet now. Just steroidsa and then reassess ? ? ?

## 2022-01-24 ENCOUNTER — Ambulatory Visit (INDEPENDENT_AMBULATORY_CARE_PROVIDER_SITE_OTHER): Payer: Medicare Other | Admitting: Internal Medicine

## 2022-01-24 ENCOUNTER — Encounter: Payer: Self-pay | Admitting: Internal Medicine

## 2022-01-24 VITALS — BP 130/80 | HR 59 | Temp 98.2°F | Ht 70.0 in | Wt 222.4 lb

## 2022-01-24 DIAGNOSIS — Z7189 Other specified counseling: Secondary | ICD-10-CM | POA: Diagnosis not present

## 2022-01-24 DIAGNOSIS — J849 Interstitial pulmonary disease, unspecified: Secondary | ICD-10-CM | POA: Diagnosis not present

## 2022-01-24 DIAGNOSIS — T50905A Adverse effect of unspecified drugs, medicaments and biological substances, initial encounter: Secondary | ICD-10-CM

## 2022-01-24 DIAGNOSIS — E669 Obesity, unspecified: Secondary | ICD-10-CM

## 2022-01-24 DIAGNOSIS — J9611 Chronic respiratory failure with hypoxia: Secondary | ICD-10-CM | POA: Diagnosis not present

## 2022-01-24 DIAGNOSIS — J189 Pneumonia, unspecified organism: Secondary | ICD-10-CM

## 2022-01-24 DIAGNOSIS — Z8579 Personal history of other malignant neoplasms of lymphoid, hematopoietic and related tissues: Secondary | ICD-10-CM

## 2022-01-24 NOTE — Progress Notes (Signed)
? ? ? ? ? ?IOV 10/04/2011 ? 66 year old male. Allergies and possible asthma. Hypothyroidism. Anxiety state but no formal diagnosis. Has history of claustrophobia as well Non-smoker. Music therapist. Dad with CAD - s/p CABG at age 64 (now 39y) ? ?Referred by Dr Donneta Romberg. Reports dyspnea. INsidious onset "all my life". Increased after starting allergy shots in June 2012. Says during hikes after he gets going it gets better. Same in gym. Notices more when he is starting out or stationary at rest. HE is not sure what it is.  Feels he needs to take a deep breath on occasion. So, now referred to cardiology and pulmonary. Says ICS Qvar in august/sept 2012 made it worse. Outside chart mentions asthma but patient says not sure if he has asthma. But reports childhood hx of asthma diagnosed by a pediatrician at age 40. Says he had similar symptoms.  Up until 1990 used to 10K but even back then had similar symptoms and would need to warm up before feeling better. Then stopped running due to neck problems. Similar during swimming exercise in 1996-1997. Quit swimming 1999. Episodes associated with wheezing but no cough. Unclear if albuterol helps but on xopenex prn esp before walking. This whole thing feels like he is not getting "enough juice". Never had PFT but recollects normal spirometry at Dr Rush [place] office.  He has seen Dr Gwendel Hanson cardiology for above - treadmill and echo and carotid doppler all pending. Current pulmonary modulating drug is Singulair only ? ?Chest x-ray 07/16/2011 is clear per my personal review.  ?xxxxxxxxxxxxxxxxxxxxxxxxxxxxxxxxxxxxxxxxxxxxxxxxxxxxxxxxxxxxxxxxxx ? ?Inpatient consult 06/16/21 ?66 y/o male with a history of multiple myeloma diagnosed in 2022, currently undergoing treatment presented to the [facility] emergency department from his hematology oncology appointment with a chief complaint of dyspnea.  The patient smoked cigarettes briefly as a young adult but since then has lived a relatively  healthy lifestyle staying active with exercise and playing golf.  He was diagnosed with multiple myeloma in the spring 2022 when a bruise was discovered while he was receiving a sports massage.  He went for further work-up and was found to have abnormal lab work, this led to a bone marrow biopsy confirming diagnosis of multiple myeloma.  He has been followed by Dr. [name] who has been treating him with VRd chemotherapy since Mar 16, 2021.  He is currently in the middle of his fifth cycle.   ?  ?Over the last several weeks he has been developing progressive shortness of breath with exertion.  He says this has been going on for about 3 to 4 weeks and has significantly worsened in the last week.  This is associated with a dry, rare cough.  He denies fevers, chills, leg swelling, weight gain, or mucus production.  In the last several days (just prior to admission) [place] felt significant shortness of breath so he presented to Dr. Libby Maw office for further evaluation.  He was sent to the emergency room for evaluation further because of the severity of his shortness of breath.  In the emergency department he was noted to have hypoxemia to an O2 saturation of 87% and an abnormal chest exam.  Pulmonary and critical care medicine was consulted for further evaluation. ? ?He says that throughout his adult life he has had what he calls "huffing" when he talks while exerting himself or with exertion.  He says he has been followed by an allergist and has been treated for asthma  at times over the years.  He does not regularly use an inhaler and he says this is not something that he routinely needs.  He was seen by my partner Dr. [name] at 1 point in 2013 during which time he had a normal chest x-ray and normal lung function testing. ?  ?He had COVID in April 2022.   ?June 15, 2021 admission ?August 26 CT chest images independently reviewed: No pulmonary embolism, diffuse interlobular septal  thickening, pleural nodularity noted, areas of groundglass opacification with some patchy distribution particularly in the lung base, no significant pleural effusion, mild reactive appearing lymphadenopathy in the mediastinum ?Acute respiratory failure with hypoxemia in the setting of diffuse parenchymal lung disease of undetermined etiology: This is somewhat of a complex case given his underlying asthma and the fact that he had COVID in 2022 and in April 2022 a CT scan of his abdomen showed some ill-defined interstitial changes in the periphery of his lungs on lung windows in the bases.  Based on the time course of his illness and reports in the literature I am most concerned about [name] lung toxicity (see citation below), though it is also important to evaluate for another underlying and progressive primary pulmonary ILD or less likely an opportunistic infection (history doesn't seem to support this).  [name] can cause eosinophilic pneumonia which we can assess for with a bronchoscopy, but his imaging characteristics aren't typical for this.  It is possible that the non-specific interstitial changes seen on his lungs in April 2022 were related to his recent COVID infection.  I don't think his baseline asthma explains his symptoms, though he did have a high serum eosinophil count a few weeks prior to admission (unclear significance).  ?Wallace Going M, Ornek S, Keske S, Tabak L, Cakar N, Zeren H, Aytekin S, [place], Ferhanoglu B. Bortezomib induced pulmonary toxicity: a case report and review of the literature. Am J Blood Res. 2020 Dec 15;10(6):407-415. PMID: 68115726; PMCID: [id number]. ? ?06/18/21 -  ? ?Results for Samuel [name], Samuel [name]" (MRN [id number]) as of 07/12/2021 09:13 ? Ref. Range 06/18/2021 12:57  ?Monocyte-Macrophage-Serous Fluid Latest Ref Range: 50 - 90 % 5 (L)  ?Other Cells, Fluid Latest Units: % CORRELATE WITH CYTOLOGY.  ?Color, Fluid Latest Ref Range: YELLOW  PINK (A)  ?Total  Nucleated Cell Count, Fluid Latest Ref Range: 0 - 1,000 cu mm 103  ?Fluid Type-FCT Unknown BRONCHIAL ALVEOLAR LAVAGE  ?Lymphs, Fluid Latest Units: % 18  ?Eos, Fluid Latest Units: % 51  ?Appearance, Fluid Latest Ref Range: CLEAR  HAZY (A)  ?Neutrophil Count, Fluid Latest Ref Range: 0 - 25 % 26 (H)  ?FINAL MICROSCOPIC DIAGNOSIS:  ?- No malignant cells identified  ?- Benign bronchial cells and pulmonary macrophages  ? ?Titrate off solumedrol and start prednisone 50 mg daily. Decrease by 10 mg weekly ? ? ? ?OV 07/12/2021 ? ?Subjective:  ?Patient ID: Samuel [name], male , DOB: September 26, 1956 , age 63 y.o. , MRN: [id number] , ADDRESS: [address] ?[place] [address] ?PCP Antony Contras, MD ?Patient Care Team: ?Antony Contras, MD as PCP - General (Family Medicine) ? ?This Provider for this visit: Treatment Team:  ?Attending Provider: Brand Males, MD ? ? ? ?07/12/2021 -   ?Chief Complaint  ?Patient presents with  ? Hospitalization Follow-up  ?  Pt states he was doing better after being out of the hospital after being placed on steroids. States about a week after being out, states he started having more  problems with SOB and to today, he has had problems with SOB with exertion.  ? 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 ?HPI ?Samuel [name] 66 y.o. -presents for follow-up from the hospital.  I originally met him 10 years ago and then at that point in time he had dyspnea.  He tells me that after that he was exercising and living a good life.  Then in April 2022 got diagnosed incidentally with myeloma multiple.  Says he was caught early.  Then in May 2022 started on [name] and [name].  He says he was doing well with this up until mid July.  Up until then he was walking 1-1/2 2 miles per day 5 days a week.  Then in late July 2022 started noticing shortness of breath.  He met Dr. [name] his oncologist on May 25, 2021.  By June 01, 2021 he had severe pulmonary  infiltrates.  He says a part of then he was getting his [name] once a week along with some steroids with each dose.  At this point in time the steroid dose was cut down but he progressed and started gett

## 2022-01-24 NOTE — Patient Instructions (Addendum)
ILD (interstitial lung disease) ([place]) ?Drug-induced pneumonitis ?Chronic respiratory failure with hypoxia (HCC) ?Encounter for medication counseling ? ?[place] visit with Dr Judie Grieve went well ?Noted plan is for high dose prednisone ?Dr Judie Grieve did not favor lung biopsy  ?Dr Judie Grieve did not favor cytoxan at thhis time ?Dr Judie Grieve recommends holding off esbriet till prednisone repeat try is complete ? ?Today 01/24/2022 0 walk test better than feb 2023 but similar to sept 2022-dec 2022 - this suggests that you are likely somewhat prednisone responsive ? ?Plan ? - Alb neb prn with neb machine for dyspnea  ?- PRednisone per [facility] protocol ?- o2 for goal pulse ox > 88% ?- [facility] appt end May 2023 ? ?Obesity (BMI 30.0-34.9) ? ?-get myfitness pal app and slowly lose weight ? - some people believe that vegeterian diets are best [place], [place]) ? ?Myeloma   ? ?In remssion ? ?Plan ? - per [facility] and Dr [name] ? ?[place] ?Mid June 2023 - 30 min visit ?

## 2022-01-31 ENCOUNTER — Other Ambulatory Visit: Payer: Self-pay

## 2022-01-31 ENCOUNTER — Inpatient Hospital Stay (HOSPITAL_BASED_OUTPATIENT_CLINIC_OR_DEPARTMENT_OTHER): Payer: Medicare Other | Admitting: Hematology and Oncology

## 2022-01-31 ENCOUNTER — Inpatient Hospital Stay: Payer: Medicare Other | Attending: Hematology and Oncology

## 2022-01-31 VITALS — BP 156/91 | HR 60 | Temp 97.6°F | Resp 18 | Ht 70.0 in | Wt 221.1 lb

## 2022-01-31 DIAGNOSIS — R0602 Shortness of breath: Secondary | ICD-10-CM | POA: Diagnosis not present

## 2022-01-31 DIAGNOSIS — C9000 Multiple myeloma not having achieved remission: Secondary | ICD-10-CM | POA: Insufficient documentation

## 2022-01-31 DIAGNOSIS — Z79899 Other long term (current) drug therapy: Secondary | ICD-10-CM | POA: Insufficient documentation

## 2022-01-31 DIAGNOSIS — Z7952 Long term (current) use of systemic steroids: Secondary | ICD-10-CM | POA: Diagnosis not present

## 2022-01-31 DIAGNOSIS — Z9221 Personal history of antineoplastic chemotherapy: Secondary | ICD-10-CM | POA: Insufficient documentation

## 2022-01-31 DIAGNOSIS — E039 Hypothyroidism, unspecified: Secondary | ICD-10-CM | POA: Insufficient documentation

## 2022-01-31 DIAGNOSIS — Z87891 Personal history of nicotine dependence: Secondary | ICD-10-CM | POA: Insufficient documentation

## 2022-01-31 DIAGNOSIS — Z7951 Long term (current) use of inhaled steroids: Secondary | ICD-10-CM | POA: Diagnosis not present

## 2022-01-31 LAB — CMP (CANCER CENTER ONLY)
ALT: 19 U/L (ref 0–44)
AST: 21 U/L (ref 15–41)
Albumin: 4 g/dL (ref 3.5–5.0)
Alkaline Phosphatase: 33 U/L — ABNORMAL LOW (ref 38–126)
Anion gap: 6 (ref 5–15)
BUN: 32 mg/dL — ABNORMAL HIGH (ref 8–23)
CO2: 27 mmol/L (ref 22–32)
Calcium: 9.1 mg/dL (ref 8.9–10.3)
Chloride: 98 mmol/L (ref 98–111)
Creatinine: 1.01 mg/dL (ref 0.61–1.24)
GFR, Estimated: >60 mL/min (ref >60)
Glucose, Bld: 96 mg/dL (ref 70–99)
Potassium: 4.3 mmol/L (ref 3.5–5.1)
Sodium: 131 mmol/L — ABNORMAL LOW (ref 135–145)
Total Bilirubin: 0.3 mg/dL (ref 0.3–1.2)
Total Protein: 7.1 g/dL (ref 6.5–8.1)

## 2022-01-31 LAB — CBC WITH DIFFERENTIAL (CANCER CENTER ONLY)
Abs Immature Granulocytes: 0.11 10*3/uL — ABNORMAL HIGH (ref 0.00–0.07)
Basophils Absolute: 0 10*3/uL (ref 0.0–0.1)
Basophils Relative: 0 %
Eosinophils Absolute: 0 10*3/uL (ref 0.0–0.5)
Eosinophils Relative: 0 %
HCT: 39.1 % (ref 39.0–52.0)
Hemoglobin: 13.5 g/dL (ref 13.0–17.0)
Immature Granulocytes: 1 %
Lymphocytes Relative: 5 %
Lymphs Abs: 0.8 10*3/uL (ref 0.7–4.0)
MCH: 32.7 pg (ref 26.0–34.0)
MCHC: 34.5 g/dL (ref 30.0–36.0)
MCV: 94.7 fL (ref 80.0–100.0)
Monocytes Absolute: 0.8 10*3/uL (ref 0.1–1.0)
Monocytes Relative: 5 %
Neutro Abs: 15.1 10*3/uL — ABNORMAL HIGH (ref 1.7–7.7)
Neutrophils Relative %: 89 %
Platelet Count: 231 10*3/uL (ref 150–400)
RBC: 4.13 MIL/uL — ABNORMAL LOW (ref 4.22–5.81)
RDW: 12.4 % (ref 11.5–15.5)
WBC Count: 16.8 10*3/uL — ABNORMAL HIGH (ref 4.0–10.5)
nRBC: 0 % (ref 0.0–0.2)

## 2022-01-31 LAB — LACTATE DEHYDROGENASE: LDH: 242 U/L — ABNORMAL HIGH (ref 98–192)

## 2022-01-31 NOTE — Progress Notes (Signed)
?[place] ?[phone]   [phone] ? ?PROGRESS NOTE ? ?Patient Care Team: ?Antony Contras, MD as PCP - General (Family Medicine) ? ?Hematological/Oncological History ?# IgG Kappa Multiple Myeloma ?02/02/2021:  Presented to [facility] due to right sided flank tenderness with bruising. CT abdomen/pelvis: Multiple small lytic lesions in the thoracolumbar spine and bilateral pelvis ?-SPEP: IgG 2,082 (H), M Protein 1.8 (H). IFE shows IgG monoclonal protein with kappa light chain specificity.  ?-LDH 169, CBC normal, CMP normal except for sodium 131 (L), Chloride 96 (L).  ? 02/14/2021: Establish care with Dede Query PA-C ?02/22/2021: bone marrow biopsy confirms the diagnosis of Multiple Myeloma with a monoclonal plasma cell population.  ?03/16/2021: Cycle 1 Day 1 of VRd chemotherapy  ?04/06/2021: Cycle 2 Day 1 of VRd chemotherapy  ?04/27/2021: Cycle 3 Day 1 of VRd chemotherapy  ?05/18/2021: Cycle 4 Day 1 of VRd chemotherapy  ?06/01/2021: drop dexamethasone to 44m PO weekly and start lasix due to shortness of breath.  ?06/15/2021: Desaturation to 87% on ambulation. HELD velcade today and sent to ED for evaluation.  ?06/22/2021: Findings consistent with drug reaction the lungs with eosinophils on BAL.  Given these findings we will definitely hold [name] and plan to avoid pomalidomide ?06/29/2021: Cycle 1 Day 1 CyBorD chemotherapy ?07/05/2021: HOLD chemotherapy given worsening lung function ? ?Interval History:  ?VDamarian PriolaTosco 66y.o. male with medical history significant for IgG Kappa multiple myeloma who presents for a follow up visit. The patient's last visit was on 11/30/2021.  In the interim since last visit he has continued  to see modest improvement in respiratory status.  ? ?On exam today Mr. Pesantez is accompanied by his wife.  He reports "I want my lungs to improve and I am working hard".  He is currently on steroids prednisone 60 mg p.o. daily.  He notes that he has been walking on the  treadmill and running and notes that he feels quite good.  He notes that the steroids have been causing some weight gain and he is dissatisfied with the number he is seeing on the scale.  He notes that he does see Dr. MFeliciana Rossettinext Monday to continue discussions regarding bone marrow transplant.  He notes that he is in "no rush" to restart therapy.  [facility]  He is eager to restart his immunotherapy and would like a bone survey. He denies any fevers, chills, sweats, nausea, vomiting or diarrhea.  A full 10 point ROS is listed below. ? ?MEDICAL HISTORY:  ?Past Medical History:  ?Diagnosis Date  ? Asthma   ? GERD (gastroesophageal reflux disease)   ? High cholesterol   ? under control.   ? History of blood transfusion   ? Hypothyroidism   ? Interstitial lung disease ([place]   ? Multiple [place]oma ([place]   ? Perennial allergic rhinitis   ? Pneumonia   ? Sciatic pain, right   ? Seasonal allergic rhinitis   ? Thyroid disease   ? ? ?SURGICAL HISTORY: ?Past Surgical History:  ?Procedure Laterality Date  ? BRONCHIAL BIOPSY  06/18/2021  ? Procedure: BRONCHIAL BIOPSIES;  Surgeon: BCollene Gobble MD;  [facility];  Service: Cardiopulmonary;;  ? BRONCHIAL BIOPSY  08/28/2021  ? Procedure: BRONCHIAL BIOPSIES;  Surgeon: IGarner Nash DO;  [place] [facility]  Service: Cardiopulmonary;;  ? BRONCHIAL WASHINGS  06/18/2021  ? Procedure: BRONCHIAL WASHINGS;  Surgeon: BCollene Gobble MD;  [facility];  Service: Cardiopulmonary;;  ? BRONCHIAL WASHINGS  08/28/2021  ? Procedure: BRONCHIAL WASHINGS;  Surgeon: Garner Nash, DO;  Location: [facility];  Service: Cardiopulmonary;;  ? NASAL SINUS SURGERY    ? NECK SURGERY  1996  ? VIDEO BRONCHOSCOPY N/A 06/18/2021  ? Procedure: VIDEO BRONCHOSCOPY WITH FLUORO;  Surgeon: Collene Gobble, MD;  [facility];  Service: Cardiopulmonary;  Laterality: N/A;  ? VIDEO BRONCHOSCOPY N/A  08/28/2021  ? Procedure: VIDEO BRONCHOSCOPY WITH FLUORO;  Surgeon: Garner Nash, DO;  Location: [facility];  Service: Cardiopulmonary;  Laterality: N/A;  ? ? ?SOCIAL HISTORY: ?Social History  ? ?Socioeconomic History  ? Marital status: Married  ?  Spouse name: Not on file  ? Number of children: 3  ? Years of education: Not on file  ? Highest education level: Not on file  ?Occupational History  ? Not on file  ?Tobacco Use  ? Smoking status: Former  ?  Packs/day: 0.10  ?  Years: 3.00  ?  Pack years: 0.30  ?  Types: Cigarettes  ?  Quit date: 10/21/1982  ?  Years since quitting: 39.3  ? Smokeless tobacco: Never  ?Vaping Use  ? Vaping Use: Never used  ?Substance and Sexual Activity  ? Alcohol use: Not Currently  ?  Comment: 1 drink daily  ? Drug use: No  ? Sexual activity: Not on file  ?Other Topics Concern  ? Not on file  ?Social History Narrative  ? Not on file  ? ?Social Determinants of Health  ? ?Financial Resource Strain: Not on file  ?Food Insecurity: Not on file  ?Transportation Needs: Not on file  ?Physical Activity: Not on file  ?Stress: Not on file  ?Social Connections: Not on file  ?Intimate Partner Violence: Not on file  ? ? ?FAMILY HISTORY: ?Family History  ?Problem Relation Age of Onset  ? Hypertension Mother   ? Allergies Mother   ? Allergies Father   ? CAD Father 16  ? Breast cancer Paternal Grandmother   ? Hypertension Other   ? Heart disease Other   ? ? ?ALLERGIES:  is allergic to clarithromycin, ofev [nintedanib], and penicillins. ? ?MEDICATIONS:  ?Current Outpatient Medications  ?Medication Sig Dispense Refill  ? acetaminophen (TYLENOL) 500 MG tablet Take 1,000 mg by mouth every 6 (six) hours as needed for mild pain, moderate pain or headache.    ? acyclovir (ZOVIRAX) 400 MG tablet Take 1 tablet (400 mg total) by mouth 2 (two) times daily. 60 tablet 3  ? albuterol (VENTOLIN HFA) 108 (90 Base) MCG/ACT inhaler Inhale 2 puffs into the lungs every 6 hours as needed for wheezing or shortness of breath.  8.5 g 1  ? ALPRAZolam (XANAX) 0.5 MG tablet Take 0.5 mg by mouth at bedtime.    ? ASSESS FULL RANGE PEAK METER [place] as directed.    ? bisoprolol (ZEBETA) 5 MG tablet Take 1 tablet (5 mg total) by mouth daily. 30 tablet 2  ? cholecalciferol (VITAMIN D) 1000 UNITS tablet Take 3,000 Units by mouth daily.    ? EPINEPHrine (EPI-PEN) 0.3 mg/0.3 mL DEVI Inject 0.3 mg into the muscle as needed.    ? ezetimibe-simvastatin (VYTORIN) 10-40 MG per tablet Take 1 tablet by mouth at bedtime.    ? fluticasone-salmeterol (ADVAIR HFA) 230-21 MCG/ACT inhaler Inhale 2 puffs into the lungs 2 (two) times daily. Brand name only 1 each 12  ? furosemide (LASIX) 20 MG tablet TAKE ONE TABLET BY MOUTH AS  NEEDED FOR FLUID OR EDEMA 14 tablet 1  ? ketoconazole (NIZORAL) 2 % cream SMARTSIG:1 Application Topical 1 to 2 Times Daily    ? montelukast (SINGULAIR) 10 MG tablet Take 10 mg by mouth at bedtime.    ? Multiple Vitamin (MULTIVITAMIN) tablet Take 1 tablet by mouth daily.    ? omeprazole (PRILOSEC) 40 MG capsule Take 1 capsule by mouth daily.    ? OXYGEN Inhale 2 L into the lungs continuous.    ? predniSONE (DELTASONE) 10 MG tablet Take by mouth. Take 24m x2 weeks, then 559mx2 weeks, then 4072m2 weeks, then 40m26mweeks, 20mg72meeks, then 10mg 33meks.    ? sildenafil (REVATIO) 20 MG tablet Take 20 mg by mouth daily as needed (ED).    ? Simethicone (SIMETHICONE ULTRA STRENGTH) 180 MG CAPS Take 1 capsule (180 mg total) by mouth 3 (three) times daily as needed. 90 capsule 0  ? sodium chloride (OCEAN) 0.65 % SOLN nasal spray Place 1 spray into both nostrils as needed for congestion.    ? sulfamethoxazole-trimethoprim (BACTRIM DS) 800-160 MG tablet Take 1 tablet by mouth 3 (three) times a week. 38 tablet 3  ? tamsulosin (FLOMAX) 0.4 MG CAPS capsule Take 0.4 mg by mouth daily.    ? thyroid (ARMOUR) 90 MG tablet Take 1 tablet by mouth daily.    ? triamcinolone cream (KENALOG) 0.1 % Apply 1 application topically daily as needed (sun burn itch).     ? ?No current facility-administered medications for this visit.  ? ? ?REVIEW OF SYSTEMS:   ?Constitutional: ( - ) fevers, ( - )  chills , ( - ) night sweats ?Eyes: ( - ) blurriness of vision, ( - ) double

## 2022-02-01 LAB — KAPPA/LAMBDA LIGHT CHAINS
Kappa free light chain: 16.5 mg/L (ref 3.3–19.4)
Kappa, lambda light chain ratio: 2.23 — ABNORMAL HIGH (ref 0.26–1.65)
Lambda free light chains: 7.4 mg/L (ref 5.7–26.3)

## 2022-02-04 LAB — MULTIPLE MYELOMA PANEL, SERUM
Albumin SerPl Elph-Mcnc: 3.8 g/dL (ref 2.9–4.4)
Albumin/Glob SerPl: 1.4 (ref 0.7–1.7)
Alpha 1: 0.2 g/dL (ref 0.0–0.4)
Alpha2 Glob SerPl Elph-Mcnc: 0.7 g/dL (ref 0.4–1.0)
B-Globulin SerPl Elph-Mcnc: 0.9 g/dL (ref 0.7–1.3)
Gamma Glob SerPl Elph-Mcnc: 1.1 g/dL (ref 0.4–1.8)
Globulin, Total: 2.9 g/dL (ref 2.2–3.9)
IgA: 190 mg/dL (ref 61–437)
IgG (Immunoglobin G), Serum: 1184 mg/dL (ref 603–1613)
IgM (Immunoglobulin M), Srm: 56 mg/dL (ref 20–172)
M Protein SerPl Elph-Mcnc: 0.2 g/dL — ABNORMAL HIGH
Total Protein ELP: 6.7 g/dL (ref 6.0–8.5)

## 2022-02-05 ENCOUNTER — Encounter: Payer: Self-pay | Admitting: Hematology and Oncology

## 2022-02-05 ENCOUNTER — Telehealth: Payer: Self-pay

## 2022-02-05 ENCOUNTER — Telehealth: Payer: Self-pay | Admitting: Hematology and Oncology

## 2022-02-05 NOTE — Telephone Encounter (Signed)
Patient called to say that he read results in my chart and understands "his little friend is back." He called wanting Dr. Libby Maw opinion as to whether he should have a full body scan prior to his apt at [facility] on May 30th to see progression or wait until he is scanned at [facility]. He is concerned that he might not get scan results from [facility] in a timely manner.  ?

## 2022-02-05 NOTE — Telephone Encounter (Signed)
Per 4/18 in basket called pt and left message about appointment.  Call back number left if changes are needed  ?

## 2022-02-06 ENCOUNTER — Other Ambulatory Visit: Payer: Self-pay | Admitting: *Deleted

## 2022-02-06 ENCOUNTER — Telehealth: Payer: Self-pay | Admitting: *Deleted

## 2022-02-06 DIAGNOSIS — C9000 Multiple myeloma not having achieved remission: Secondary | ICD-10-CM

## 2022-02-06 NOTE — Progress Notes (Signed)
DG Bone survey ordered ?

## 2022-02-06 NOTE — Telephone Encounter (Signed)
-----   Message from Orson Slick, MD sent at 02/05/2022  9:52 AM EDT ----- ?Please let Mr. Cajas know that his M protein is becoming detectable again. We registered the M protein at 0.2. We will continue to monitor and communicate with [facility].  ? ?----- Message ----- ?From: Interface, Lab In [facility] ?Sent: 01/31/2022  10:52 AM EDT ?To: Orson Slick, MD ? ? ?

## 2022-02-06 NOTE — Telephone Encounter (Signed)
Attempted call back to patient. No answer but was able to leave vm message for pt to call back @ [phone] ?

## 2022-02-11 ENCOUNTER — Ambulatory Visit (HOSPITAL_COMMUNITY)
Admission: RE | Admit: 2022-02-11 | Discharge: 2022-02-11 | Disposition: A | Discharge status: Home / Self Care | Payer: Medicare Other | Source: Ambulatory Visit | Attending: Hematology and Oncology | Admitting: Hematology and Oncology

## 2022-02-11 DIAGNOSIS — C9000 Multiple myeloma not having achieved remission: Secondary | ICD-10-CM | POA: Insufficient documentation

## 2022-02-14 ENCOUNTER — Telehealth: Payer: Self-pay | Admitting: *Deleted

## 2022-02-14 NOTE — Telephone Encounter (Signed)
-----  Message from Orson Slick, MD sent at 02/13/2022  9:01 AM EDT ----- ?Please let Mr. Winquist know that his DG bone met survey shows slight progression of lytic lesions. No new lesions or new areas of concern. Possible that these grew in the interval between the last scan (May 2022) and the start of treatment (a few weeks later). We can continue to monitor and keep treating with Zometa q 3 months.  ? ?----- Message ----- ?From: Interface, Rad Results In ?Sent: 02/12/2022   2:48 PM EDT ?To: Orson Slick, MD ? ? ?

## 2022-02-14 NOTE — Telephone Encounter (Signed)
[name] patient regarding Bone Survey results and Zometa. Spoke with him. Reviewed his recent bone survey and compared to his last survey in May 2022. Advised that Dr. [name] recommends resuming pt's Zometa infusions every 3 months.  Pt is in agreement for this. ?He voiced understanding of bone survey. He will be traveling back to [facility] at the end of May. Advised we will likely do the Zometa before that time. ? ?Scheduling message sent for Zometa. ?

## 2022-02-15 ENCOUNTER — Telehealth: Payer: Self-pay | Admitting: Hematology and Oncology

## 2022-02-15 NOTE — Telephone Encounter (Signed)
Scheduled appointment per 04/28 inbasket message. Please advise that Zometa inf is scheduled with June apts. Per patients request. ?

## 2022-03-01 ENCOUNTER — Encounter (HOSPITAL_COMMUNITY): Payer: Self-pay

## 2022-03-27 ENCOUNTER — Encounter: Payer: Self-pay | Admitting: Hematology and Oncology

## 2022-04-01 ENCOUNTER — Telehealth: Payer: Self-pay | Admitting: *Deleted

## 2022-04-01 NOTE — Telephone Encounter (Signed)
Received call from pt. He states [facility]. He states he is doing well-continues with his exercising/breathing work and diet. He says his breathing is the best it has been in awhile though the PFT's don't necessarily show that. His pulmonary MD @ [facility] wants him to keep doing what he is doing. The Hematologist noted his M protein to be + but no numerical value is attached to that. Pt states he tested + for Covid on 03/25/22 and wants to know if he can still come here on 04/03/22. Advised he needs to wait the 21 days as he is not currently on treatment for Multiple Myeloma. Will reschedule for 04/17/22 for labs, MD visit and Zometa infusion. Pt states the MD @ [facility] would like his MM panels done monthly here  Dr. [name] made aware of the above. Scheduling message sent to move appt from 6/14 to 04/17/22

## 2022-04-02 ENCOUNTER — Telehealth: Payer: Self-pay | Admitting: Hematology and Oncology

## 2022-04-02 NOTE — Telephone Encounter (Signed)
.  Called patient to schedule appointment per 6/12 inbaket, patient is aware of date and time.

## 2022-04-03 ENCOUNTER — Inpatient Hospital Stay: Payer: Medicare Other | Admitting: Hematology and Oncology

## 2022-04-03 ENCOUNTER — Inpatient Hospital Stay: Payer: Medicare Other

## 2022-04-05 ENCOUNTER — Telehealth: Payer: Self-pay | Admitting: Internal Medicine

## 2022-04-05 NOTE — Telephone Encounter (Signed)
[place] desk.[place]  Pleae move [place] Mink Kercher to 12.00 or 11.45am samne day of aopt instead of afternoon.04/09/22  This is because h eis on a 15 min slot and afternoon is more jammed than morning  Thanks    SIGNATURE    Dr. Brand Males, M.D., F.C.C.P,  Pulmonary and Critical Care Medicine Staff Physician, [place] Director - Interstitial Lung Disease  Program  Medical Director - [place] [facility] [place] at [place], [place], [address]  NPI Number:  NPI #[id number] [facility] Number: [id number]  Pager: [phone], If no answer  -> Check AMION or Try [place] Telephone (clinical office): [phone] Telephone (research): [phone]  11:30 AM 04/05/2022

## 2022-04-05 NOTE — Telephone Encounter (Signed)
Patient was moved to 12pm on 04/09/2022 and 3pm slot blocked. Nothing further needed.

## 2022-04-05 NOTE — Telephone Encounter (Signed)
Thanks a lot [place]. No reply needed

## 2022-04-09 ENCOUNTER — Ambulatory Visit (INDEPENDENT_AMBULATORY_CARE_PROVIDER_SITE_OTHER): Payer: Medicare Other | Admitting: Internal Medicine

## 2022-04-09 ENCOUNTER — Encounter: Payer: Self-pay | Admitting: Internal Medicine

## 2022-04-09 ENCOUNTER — Ambulatory Visit: Payer: BLUE CROSS/BLUE SHIELD | Admitting: Internal Medicine

## 2022-04-09 VITALS — BP 110/70 | HR 53 | Temp 98.2°F | Ht 70.0 in | Wt 215.8 lb

## 2022-04-09 DIAGNOSIS — J849 Interstitial pulmonary disease, unspecified: Secondary | ICD-10-CM

## 2022-04-09 DIAGNOSIS — J45909 Unspecified asthma, uncomplicated: Secondary | ICD-10-CM | POA: Diagnosis not present

## 2022-04-09 MED ORDER — PREDNISONE 5 MG PO TABS: ORAL_TABLET | ORAL | 2 refills | Status: DC

## 2022-04-09 MED ORDER — ALBUTEROL SULFATE (2.5 MG/3ML) 0.083% IN NEBU: 2.5000 mg | INHALATION_SOLUTION | Freq: Four times a day (QID) | RESPIRATORY_TRACT | 12 refills | Status: DC | PRN

## 2022-04-09 MED ORDER — PREDNISONE 10 MG PO TABS: ORAL_TABLET | ORAL | 0 refills | Status: DC

## 2022-04-09 NOTE — Patient Instructions (Addendum)
ILD (interstitial lung disease) ([place]) Drug-induced pneumonitis Chronic respiratory failure with hypoxia ([place]) Encounter for medication counseling  Glad [facility]nd [facility] visit with Dr Judie Grieve went well Noted plan is for prednisone taper to off and decision on esbiret left to you Dr Judie Grieve did not favor lung biopsy  Dr Judie Grieve did not favor cytoxan at thhis time  However, March 27 2022 with 2nd covid. Some more cough after that [place] based on walk test if your ILD is flare up -   Plan  - Alb neb prn with neb machine for dyspnea  - Take prednisone 40 mg daily x 2 days, then prednisone '30mg'$  daily x 2 days, then '20mg'$  daily x 2 days, then '10mg'$  daily x 2 days, then 7.'5mg'$  baseline dose that you will taper to off per [facility]  - o2 for goal pulse ox > 88% - [facility] appt end May 2023  Obesity (BMI 30.0-34.9)  -get myfitness pal app and slowly lose weight - glad there is weight loss  - some people believe that vegeterian diets are best [place], [place]) - talk to PCP Antony Contras, MD about [facility]  Myeloma    In [place]  - per [facility] and Dr Burgess Estelle Spiro/dlco in 4-6 weeks Verlie Liotta or APP in  4-6 weeks  - symptoms/wak test at followu  - esbriet conversation at followup

## 2022-04-09 NOTE — Progress Notes (Signed)
IOV 10/04/2011  66 year old male. Allergies and possible asthma. Hypothyroidism. Anxiety state but no formal diagnosis. Has history of claustrophobia as well Non-smoker. Music therapist. Dad with CAD - s/p CABG at age 42 (now 29y)  Referred by Dr Donneta Romberg. Reports dyspnea. INsidious onset "all my life". Increased after starting allergy shots in June 2012. Says during hikes after he gets going it gets better. Same in gym. Notices more when he is starting out or stationary at rest. HE is not sure what it is.  Feels he needs to take a deep breath on occasion. So, now referred to cardiology and pulmonary. Says ICS Qvar in august/sept 2012 made it worse. Outside chart mentions asthma but patient says not sure if he has asthma. But reports childhood hx of asthma diagnosed by a pediatrician at age 66. Says he had similar symptoms.  Up until 1990 used to 10K but even back then had similar symptoms and would need to warm up before feeling better. Then stopped running due to neck problems. Similar during swimming exercise in 1996-1997. Quit swimming 1999. Episodes associated with wheezing but no cough. Unclear if albuterol helps but on xopenex prn esp before walking. This whole thing feels like he is not getting "enough juice". Never had PFT but recollects normal spirometry at Dr Rush [place]mark office.  He has seen Dr Gwendel Hanson cardiology for above - treadmill and echo and carotid doppler all pending. Current pulmonary modulating drug is Singulair only  Chest x-ray 07/16/2011 is clear per my personal review.  xxxxxxxxxxxxxxxxxxxxxxxxxxxxxxxxxxxxxxxxxxxxxxxxxxxxxxxxxxxxxxxxxx  Inpatient consult 06/16/21 66 y/o male with a history of multiple myeloma diagnosed in 2022, currently undergoing treatment presented to the [facility] emergency department from his hematology oncology appointment with a chief complaint of dyspnea.  The patient smoked cigarettes briefly as a young adult but since then has lived a relatively  healthy lifestyle staying active with exercise and playing golf.  He was diagnosed with multiple myeloma in the spring 2022 when a bruise was discovered while he was receiving a sports massage.  He went for further work-up and was found to have abnormal lab work, this led to a bone marrow biopsy confirming diagnosis of multiple myeloma.  He has been followed by Dr. [name] who has been treating him with VRd chemotherapy since Mar 16, 2021.  He is currently in the middle of his fifth cycle.     Over the last several weeks he has been developing progressive shortness of breath with exertion.  He says this has been going on for about 3 to 4 weeks and has significantly worsened in the last week.  This is associated with a dry, rare cough.  He denies fevers, chills, leg swelling, weight gain, or mucus production.  In the last several days (just prior to admission) [place] and while there felt significant shortness of breath so he presented to Dr. Libby Maw office for further evaluation.  He was sent to the emergency room for evaluation further because of the severity of his shortness of breath.  In the emergency department he was noted to have hypoxemia to an O2 saturation of 87% and an abnormal chest exam.  Pulmonary and critical care medicine was consulted for further evaluation.  He says that throughout his adult life he has had what he calls "huffing" when he talks while exerting himself or with exertion.  He says he has been followed by an allergist and has been treated for asthma at  times over the years.  He does not regularly use an inhaler and he says this is not something that he routinely needs.  He was seen by my partner Dr. [name] at 1 point in 2013 during which time he had a normal chest x-ray and normal lung function testing.   He had COVID in April 2022.   June 15, 2021 admission August 26 CT chest images independently reviewed: No pulmonary embolism, diffuse interlobular septal  thickening, pleural nodularity noted, areas of groundglass opacification with some patchy distribution particularly in the lung base, no significant pleural effusion, mild reactive appearing lymphadenopathy in the mediastinum Acute respiratory failure with hypoxemia in the setting of diffuse parenchymal lung disease of undetermined etiology: This is somewhat of a complex case given his underlying asthma and the fact that he had COVID in 2022 and in April 2022 a CT scan of his abdomen showed some ill-defined interstitial changes in the periphery of his lungs on lung windows in the bases.  Based on the time course of his illness and reports in the literature I am most concerned about [name] lung toxicity (see citation below), though it is also important to evaluate for another underlying and progressive primary pulmonary ILD or less likely an opportunistic infection (history doesn't seem to support this).  [name] can cause eosinophilic pneumonia which we can assess for with a bronchoscopy, but his imaging characteristics aren't typical for this.  It is possible that the non-specific interstitial changes seen on his lungs in April 2022 were related to his recent COVID infection.  I don't think his baseline asthma explains his symptoms, though he did have a high serum eosinophil count a few weeks prior to admission (unclear significance).  Saglam B, Paulita Fujita M, Ornek S, Keske S, Tabak L, Cakar N, Zeren H, Aytekin S, [place], Ferhanoglu B. Bortezomib induced pulmonary toxicity: a case report and review of the literature. Am J Blood Res. 2020 Dec 15;10(6):407-415. PMID: 85885027; PMCID: [id number].  06/18/21 -   Results for CLAYTEN, ALLCOCK" (MRN [id number]) as of 07/12/2021 09:13  Ref. Range 06/18/2021 12:57  Monocyte-Macrophage-Serous Fluid Latest Ref Range: 50 - 90 % 5 (L)  Other Cells, Fluid Latest Units: % CORRELATE WITH CYTOLOGY.  Color, Fluid Latest Ref Range: YELLOW  PINK (A)  Total  Nucleated Cell Count, Fluid Latest Ref Range: 0 - 1,000 cu mm 103  Fluid Type-FCT Unknown BRONCHIAL ALVEOLAR LAVAGE  Lymphs, Fluid Latest Units: % 18  Eos, Fluid Latest Units: % 51  Appearance, Fluid Latest Ref Range: CLEAR  HAZY (A)  Neutrophil Count, Fluid Latest Ref Range: 0 - 25 % 26 (H)  FINAL MICROSCOPIC DIAGNOSIS:  - No malignant cells identified  - Benign bronchial cells and pulmonary macrophages   Titrate off solumedrol and start prednisone 50 mg daily. Decrease by 10 mg weekly    OV 07/12/2021  Subjective:  Patient ID: Billee Cashing, male , DOB: 1956-08-06 , age 66 y.o. , MRN: [id number] , ADDRESS: [place] [id number] PCP Antony Contras, MD Patient Care Team: Antony Contras, MD as PCP - General (Family Medicine)  This Provider for this visit: Treatment Team:  Attending Provider: Brand Males, MD    07/12/2021 -   Chief Complaint  Patient presents with   Hospitalization Follow-up    Pt states he was doing better after being out of the hospital after being placed on steroids. States about a week after being out, states he started having more problems  with SOB and to today, he has had problems with SOB with exertion.   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 -presents for follow-up from the hospital.  I originally met him 10 years ago and then at that point in time he had dyspnea.  He tells me that after that he was exercising and living a good life.  Then in April 2022 got diagnosed incidentally with myeloma multiple.  Says he was caught early.  Then in May 2022 started on [name] and [name].  He says he was doing well with this up until mid July.  Up until then he was walking 1-1/2 2 miles per day 5 days a week.  Then in late July 2022 started noticing shortness of breath.  He met Dr. [name] his oncologist on May 25, 2021.  By June 01, 2021 he had severe pulmonary  infiltrates.  He says a part of then he was getting his [name] once a week along with some steroids with each dose.  At this point in time the steroid dose was cut down but he progressed and started getting worse.  Then on June 15, 2021 he was in significant respiratory distress and admitted to the hospital.  I reviewed the hospital records.  He was there for 3 days.  He underwent bronchoscopy with BAL there is also 51% eosinophilia.  Cultures are negative malignant cells are negative.  He was seen by my pulmonary colleagues.  Discussion was held with the [name] other [name] was the etiology.  Given the use and failure it looks like his [name] has been held.  He was discharged on 50 mg prednisone with advised to taper it by 10 mg/week.  Currently is on 20 mg prednisone.  He started playing golf again and feeling really good through Labor Day weekend..  Then on June 29, 2021 his [name] was not given.  Up until this point all chemo was held.  He was given Cytoxan, Zomenta and [name].  The very next in June 30, 2021 he started feeling worse.  He feels [name] is the etiology for this.  On July 06, 2021 he communicated this with Dr. [name] and all chemo has been held.  He is being referred to the bone marrow transplant program at [facility] for further evaluation.  At this point in time he says he is much better than when he was in the hospital but definitely not as good as he was prior to getting a rechallenge with [name] on June 29, 2021.  But he notices that he slowly improving.  Walking desaturation test shows that his pulse ox is holding up although he does have a tendency to desaturate.   Did extensive review of the literature.  According to up-to-date [name] is a known etiology for pulm eosinophilia but he got worse after getting rechallenged with [name].  Review of the literature shows that [name] can cause drug-induced pneumonitis and a small fraction of patients.  The  literature is  NO INFOR  as to whether these patients have eosinophilia or not but definitely they seem to be steroid responsive.  This literature is attached below.  In the timeframe also fits in with [name].  He is also noticed to be on allopurinol which rarely can cause hypersensitivity syndrome or dermatitis, hepatitis and eosinophilia - ADDRESS Syndrome but he did not have thse features of rash etc     Nevertheless significant eosinophilia in  the BAL 51% suggest drug-induced pneumonitis.  He currently has significant symptoms of shortness of breath.  He does not have oxygen with him at home.  Simple office walk 185 feet x  3 laps goal with forehead probe 07/12/2021    O2 used ra   Number laps completed 3   Comments about pace slow   Resting Pulse Ox/HR 100% and 69/min   Final Pulse Ox/HR 92% and 81/min   Desaturated </= 88% no   Desaturated <= 3% points Yes 8   Got Tachycardic >/= 90/min no   Symptoms at end of test No complaints   Miscellaneous comments x        Causes of Pulmonary Eosinophilia: from UPTODATE A. Nonsteroidal antiinflammatory drugs (NSAIDs) B. Nitrofurantoin  C. Minocycline D. Sulfonamides/ Sulfamethoxazole E. Ampicillin F. Daptomycin G.Vancomycin H. Dapsone I. Beta-lactam antibiotics J. Nevirapine K. Telaprevir L. Sulfasalazine M. Methotrexate N. Mesalamine O. Amiodarone P. Bleomycin Q. ACE Inhibitor R. Beta blocker S. Hydrochlorothiazides T. L- Tryptophan U. Allopurinol V. Carbamazepine W. Lamotrigine X. Phenytoin Y. Phenindione Z. Fluindione AA. Olanzapine AB. Oxcarbazepine AC. Strontium Ranelate AD. Lenalidomide AE. Radiographic contrast media  Saglam B, Kalyon H, Ozbalak M, Ornek S, Keske S, Tabak L, Cakar N, Zeren H, Aytekin S, [place], [place] B. Bortezomib induced pulmonary toxicity: a case report and review of the literature. Am J Blood Res. 2020 Dec 15;10(6):407-415. PMID: 38182993; PMCID: [id number]. - Bortezomib  related pulmonary toxicities are rarely reported. Although the incidence of Bortezomib induced lung injury (BLI) is unknown, in a large registry study of 1010 MM patients, 45 patients were reported to have BLI by their physician. However, the causality could only be constituted in 26 patients (2.6%), with 5 of them resulting in death despite steroid treatment.In the case reports, the average number of RVD cycles until the toxicity was presented was 6.9, and the period between the development of pulmonary toxicity and the first dose of Bortezomib was 31.1 days -No mention of eosinophilia  Bortezomib therapy-related lung disease in [place] patients with multiple myeloma: Incidence, mortality and clinical characterization Dyann [facility] Miyao,2 Vidalia Masahiko Kusumoto,5 Storden Fumikazu BOFBP,1 [place] Sugiyama,8 Kiyohiko Hatake,9 [place] Pao Fukuda,10 and [place] patients registered, 81 (4.5%) developed BILD, 5 (0.50%) of whom had fatal cases. The median time to BILD onset from the first bortezomib dose was 14.5 days, and most of the patients responded well to corticosteroid therapy. A retrospective review by the Lung Injury Medical Expert Panel revealed that the types with capillary leak syndrome and hypoxia without infiltrative shadows were uniquely and frequently observed in patients with BIL - no mention of eosinophilia  CellularOperator.fi =- allopurinol complex multisystem Hypersensitivityey   has a past medical history of Asthma, High cholesterol, Perennial allergic rhinitis, Sciatic pain, right, Seasonal allergic rhinitis, and Thyroid disease.   reports that he quit smoki     07/20/2021  - Visit, NP [name]    66 year old male former smoker followed in our office for shortness of breath and interstitial lung disease.  Established with Dr. [name].  Initially  consulted when inpatient with our team in August/2022.  Chief complaint at that point time was dyspnea.  He was diagnosed with multiple myeloma in the spring 2022 when a bruise was discovered while he was recovering from a sports massage.  He went for further work-up and he was found to have abnormal lab work this led to a bone  marrow biopsy confirming diagnosis of multiple myeloma.  He has been followed by Dr. [name].  He was in the middle of treating him with VRD chemotherapy since May/20 04/2021.  He was in the middle of his fifth cycle.  Patient also had [place] in April/2022.  Chest CT in April/2022 showed ill-defined interstitial changes in the periphery.  Concern for [name] lung toxicity.  Patient also had a bronchoscopy performed in 06/18/2021.  Eosinophils 51%, lymphs 18%, no malignant cells identified patient was started on a long-term prednisone taper.  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  Still currently having significant symptoms of shortness of breath.    Last seen in our office on 07/12/2021 by Dr. [name].  Plan of care at that office visit was as follows: Stop [name], check chest x-ray, check CBC with differential, blood ESR, blood IgE, vitamin D, hemoglobin A1c, G6PD, walk test, check Ono at home, change prednisone to 50 mg daily for 2 weeks, then 40 mg daily for 2 weeks, then 30 mg daily for 2 weeks, then 20 mg daily for 2 weeks, 2-week follow-up with APP or Dr. [name], 4-week follow-up with Dr. [name] and 30-minute time slot.  Per chart review on 07/13/2021 [name] was discontinued.  Patient was also seen by Dr. [name] on 07/18/2021 I am unable to view this note.  Patient reporting that weight team would like for him to resume [name] at a maintenance dose of 10 mg.  This is the current plan.  He will see oncology next Friday for blood work.  Patient is scheduled to complete an overnight oximetry test next Tuesday.  He remains  adherent to his prednisone taper 50 mg daily.  Patient reports that this weekend he did play 9 holes of golf on Saturday and on Sunday.  He was limited by his physical exertion.  He also exercised on Tuesday and worked out with a Clinical research associate.  Patient is eager to get back to baseline physical activity.  Patient reports that on Wednesday (07/18/2021) of this week he had increased shortness of breath, cough, worsened acid reflux and he vomited.  Patient believes that this may also be due to the fact that he ate a dinner meal quite quickly.  This sometimes happens when he does this.  He also reports that his blood pressure was high at that time.  Patient reports that he has been off the [name] for at least 1 month.  Patient has stopped his allopurinol as of 07/18/2021.  Patient and spouse are both frustrated regarding dyspnea and have hopes that he would be improving quicker.  There are also concerns that he may have acute worsened symptoms or an acute infection such as bronchitis.  We will discuss and evaluate for this today.  Walk today in office: 07/20/2021-completed 2 laps on room air, dropped to 93%  07/26/2021- Interim hx  Patient presents today for 1 week follow-up. ILD felt to be related to drug pneumonitis from [name] as well as [name] for his treatment of multiple myeloma. BAL showed significant eosinophilia. Dr. [name] lowered dose of [name] to 49m daily. CXR on 07/20/21 showed chronic ILD, no definite acute findings. Ambulatory walk during his last visit showed no oxygen desaturations. During his last visit he was ordered for HRCT, PFTs and ONO.   Accompanied by his wife. He had a bad weekend, his respiratory symptoms have been slightly better the last two days. He is currently off BOTH [name] and [name] (may retry  [name] at lower dose). He stopped using BREO d/t throat irritation. Xanax has helped relieves some anxiety and chest tightness. Wife reports that he is sleeping better. He in  on prolonged prednisone taper. He will be starting 67m prednisone tomorrow  x 2 weeks. He is taking Singulair and generic fluticasone nasal spray. He has been off allergy shots since August. HRCT and PFTs are scheduled for next week. Awaiting results to be faxed for ONO from [employer].    OV 08/02/2021 -   Subjective:  Patient ID: VBillee Cashing male , DOB: 131-Aug-1957, age 66y.o. , MRN: [id number], [place]: [id number] SAntony Contras MD Patient Care Team: SAntony Contras MD as PCP - General (Family Medicine)  This Provider for this visit: Treatment Team:  Attending Provider: RBrand Males MD  Type of visit: Telephone/Video Circumstance: COVID-19 national emergency Identification of patient VDAMIEON ARMENDARIZwith 1November 02, 1957and MRN [id number]- 2 person identifier Risks: Risks, benefits, limitations of telephone visit explained. 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 CT abdomen/pelvis: Multiple small lytic lesions in the thoracolumbar spine and bilateral pelvis -SPEP: IgG 2,082 (H), M Protein 1.8 (H). IFE shows IgG monoclonal protein with kappa light chain specificity.  -LDH 169, CBC normal, CMP normal except for sodium 131 (L), Chloride 96 (L).   02/14/2021: Establish care with IDede QueryPA-C 02/22/2021: bone marrow biopsy confirms the diagnosis of Multiple Myeloma with a monoclonal plasma cell population.  03/16/2021: Cycle 1 Day 1 of VRd chemotherapy  04/06/2021: Cycle 2 Day 1 of VRd chemotherapy  04/27/2021: Cycle 3 Day 1 of VRd chemotherapy  05/18/2021: Cycle 4 Day 1 of VRd chemotherapy  06/01/2021: drop  dexamethasone to 244mPO weekly and start lasix due to shortness of breath.  06/15/2021: Desaturation to 87% on ambulation. HELD velcade today and sent to ED for evaluation.  06/22/2021: Findings consistent with drug reaction the lungs with eosinophils on BAL.  Given these findings we will definitely hold [name] and plan to avoid pomalidomide 06/29/2021: Cycle 1 Day 1 CyBorD chemotherapy   HPI ViPatty Lopezgarciaosco [age].o. -there is a telephone visit.  He is supposed to see me next week but he had a CT scan of the chest 2 days ago and had pulmonary function test today.  He really wanted to discuss the results today itself.  Review of the records indicate that he still on prednisone taper.  He is able to ambulate and desaturated only after 200 feet.  There are some mild nocturnal desaturation.  We started 2 L of nasal cannula oxygen.  His CT scan of the chest shows diffuse groundglass opacity in a pattern that is inconsistent with UIP [less than 40% chance this is UIP] suggestive of alternate pattern.  He says he is able to do weight training exercises well but when he walks on a treadmill or does ambulation that is when it bothers him.  When he rests he is better.  He does desaturate to 80s percent.  He is wondering if this could be from asthma.  I told him otherwise.  Touch base with Dr. DoLorenso Courieris oncologist.  He is  scheduled for cyclophosphamide tomorrow along with low-dose [name].  This was held off recently in September 2022.  But oncology is wanting to rechallenge him with low-dose [name].  They wanted approval for this.   ONO RA 07/24/21   - - 45 min spent <88%. 7+ hours was > 90%. OVerall not bad  Plan  Start 2L [place] QHS  Results for BLAIRE, HODSDON" (MRN [id number]) as of 08/02/2021 12:16  Ref. Range 08/02/2021  08/02/2021   FVC-Pre Latest Units: L 1.55   FVC-%Pred-Pre Latest Units: % 33   Results for MEKO, MASTERSON" (MRN [id number]) as of 08/02/2021 12:16  Ref. Range  08/02/2021    DLCO cor Latest Units: ml/min/mmHg 12.52   DLCO cor % pred Latest Units: % 46   Results for DAMARIUS, KARNES" (MRN [id number]) as of 08/02/2021 12:16  Ref. Range 08/02/2021    TLC Latest Units: L 4.18   TLC % pred Latest Units: % 59    Results for BRITTANY, AMIRAULT" (MRN [id number]) as of 08/02/2021 12:16  Ref. Range 07/12/2021 09:54  G-6PDH Latest Ref Range: 7.0 - 20.5 U/g Hgb 14.8   CT Chest data  CT Chest High Resolution  Result Date: 07/31/2021 CLINICAL DATA:  Evaluate for interstitial lung disease EXAM: CT CHEST WITHOUT CONTRAST TECHNIQUE: Multidetector CT imaging of the chest was performed following the standard protocol without intravenous contrast. High resolution imaging of the lungs, as well as inspiratory and expiratory imaging, was performed. COMPARISON:  Chest CT dated June 15, 2021; abdomen and pelvis CT dated February 02, 2021 FINDINGS: Cardiovascular: Cardiomegaly with trace pericardial effusion. Coronary artery calcifications of the RCA and LAD. Atherosclerotic disease of the thoracic aorta. Mediastinum/Nodes: Esophagus is unremarkable. Atrophic thyroid. Mediastinal lymph nodes are decreased in size when compared with prior exam. Reference AP window lymph node on series 2, image 42 measures 1.1 cm in short axis, previously 1.3 cm. Lungs/Pleura: Central airways are patent. Images are motion degraded. Mild diffuse ground-glass opacity with peribronchovascular and subpleural reticular glass opacities and traction bronchiectasis. No clear craniocaudal predominance. Mild bilateral air trapping. 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 Stable solid right middle lobe pulmonary nodule measuring 3 mm on series 3, image 57. Upper Abdomen: No acute abnormality. Musculoskeletal: No chest wall mass or suspicious bone lesions identified. IMPRESSION: Limited evaluation due to respiratory motion artifact. Within limitations, there are diffuse bilateral  ground-glass opacities with peribronchovascular and subpleural reticular opacities, traction bronchiectasis and no clear craniocaudal predominance. Differential considerations include sequela of acute lung injury, NSIP, or fibrotic HP given presence of air trapping. Mild subpleural reticular opacities were present on visualized portions of the lung on prior abdomen and pelvis CT dated February 02, 2021, although majority of findings are new. Findings are suggestive of an alternative diagnosis (not UIP) per consensus guidelines: Diagnosis of Idiopathic Pulmonary Fibrosis: An Official ATS/ERS/JRS/ALAT Clinical Practice Guideline. [place], Iss 5, [phone], Jun 21 2017. Small solid pulmonary nodule the right middle lobe measuring 3 mm. No follow-up needed if patient is low-risk. Non-contrast chest CT can be considered in 12 months if patient is high-risk. This recommendation follows the consensus statement: Guidelines for Management of Incidental Pulmonary Nodules Detected on CT Images: From the Fleischner Society 2017; Radiology 2017; 284:228-243. Aortic Atherosclerosis (ICD10-I70.0). Electronically Signed   By: Yetta Glassman M.D.   On: 07/31/2021 12:01         08/16/2021 -  fu drug induced  pneumonitis   HPI Gianmarco Roye Ryer 66 y.o. -26md desaturated. STarted on portable o2 since yesterday and Is feeling better. On Room air say he is desaturated.  Called to discuss Bronch . He is scheduled to see DR PAtwardhan 08/22/21 wed at 8.30am. PRefers not to have bronch done at that that tme.  Discussed the consensus about having bronchoscopy with lavage to rule out any opportunistic infections.  At this time I took the opportunity of also recommending transbronchial biopsies.  He did have transbronchial biopsies when he was a lot more hypoxemic in August 2022 we will send for histopathology and was nondiagnostic.  This time I recommended we send it off for RNA genomic classifier for UIP it is  not a sensitive test but it is specific.  If it comes back positive then we would know that there is permanency to this and also its a marker of progression potentially.  He is willing to go through this.  Explained we under general anesthesia. Based on schedule will be myself or Dr. IValeta Harmsdoing it.  Explained the following risks   Risks of pneumothorax, hemothorax, sedation/anesthesia complications such as cardiac or respiratory arrest or hypotension, stroke and bleeding all explained. Benefits of diagnosis but limitations of non-diagnosis also explained. Patient verbalized understanding and wished to proceed.    We then discussed pulse dose steroids.  Told him we will have to wait close to a week to make sure there is fungal smears PCP and AFB smears and bacterial culture negative.  At that point we will have to take a decision on giving 1 g Solu-Medrol for the a day for 3 days.  Ideally would need admission he does not want this.  He wants to do with is on an outpatient setting ideally.  Explained to him about the risks with steroids such as hyperglycemia, hypertension but said that I would try to work with the DME company or outpatient/home nursing to see if this would be possible.  He was appreciative.  Also discussed with Dr. IValeta Harmswho is willing to do the biopsy based on schedule of the operating room, myself and the patient if things do not work out.  Sent a secure chat to Dr. PVirgina Jock  Told him about the patient.  He will give u[place] clearance on 08/22/2021.  Recommended also look for BNP and heart failure.  CT Chest data  No results found.   OV 08/09/2021  Subjective:  Patient ID: VBillee Cashing male , DOB: 103-Dec-1957, age 66y.o. , MRN: [id number], [id number] [id number] SAntony Contras MD Patient Care Team: SAntony Contras MD as PCP - General (Family Medicine)  This Provider for this visit: Treatment Team:  Attending Provider: RBrand Males  MD    08/09/2021 -   Chief Complaint  Patient presents with   Follow-up    Pt states he is about the same since last visit, maybe a little better. Pt is coughing more at times and states he does cough a lot before bed.   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 -returns for follow-up.  He is finishing up 40 mg of prednisone per day.  Restarting 30 mg/day of prednisone tomorrow.  He is not really better.  Today he was able to only walk 2 out of the customary 3 laps.  And he showed a tendency to desaturate.  He says  he is extremely anxious.  This is understandable.  He also states that when he lifts weights in the gym he does not have a problem but when he exerts himself he feels worse.  Previously his nocturnal desaturation test showed abnormality and we recommended night oxygen but he declined per the CMA.  But today he and his wife tell me that they would be interested in getting some oxygen if it would help with shortness of breath.  Simple walking desaturation test does not make him qualify and will need a 6-minute walk test to qualify for portable oxygen.  They are also worried about the lack of improvement.  They are wondering about second opinion.  I did unofficially check with some of my colleagues in the [place].  No clear-cut plan has been developed.  We discussed about the possibility of visiting Dr. Virgel Manifold, ILD group in [place].  They seemed enthused about the idea but wanted me to reach out to Dr. Ovid Curd first.  In terms of his myeloma review of the medical records from office visit 08/03/2021 with Dr. [name] and talking to the patient and the labs show the myeloma still in remission.  But Dr. [name] is extremely concerned that the myeloma will come back.  Oncology still wants to rechallenge him with [name] but after my personal discussion with Dr. [name] on  08/02/2021 and concern for pulmonary toxicity chemotherapy is on hold till patient recovers.  He is also concerned about the presence of coronary artery calcification on his recent CT scan of the chest.  His dad had coronary artery disease diagnosed at 17 and had bypass.  He wants to visit this with Dr. Einar Gip again       08/31/2021 -video visit to discuss bronchoscopy results from 08/28/2021 and next step in plan    HPI JERMANIE MINSHALL 66 y.o. -his bronchoscopy with lavage 08/28/2021 shows 40% lymphocytes.  Anything greater than 30% is against UIP.  His RNA genomic classifier is in progress and that would be a specific test although not a ensitive test for UIP.  Bacterial cultures are negative.  He continues have shortness of breath and on room air at rest he is fine but sometimes when he goes to the bathroom he can desaturate into the high 80s.  He is frustrated by his condition.  Our original plan was to ensure no opportunistic or bacterial infections [fully understanding that MTB and fungal infections can take 6 weeks] with the current bronchoscopy and to consider bronchoalveolar lavage.  And then based on this we will schedule pulsed dose steroids.    Recently his G6PD is returned is normal.  He is not on Bactrim for prophylaxis.  Current prednisone Is 68m per day.  He prefers outpatient treatment plan.  We discussed the side effects of high-dose steroids including opportunistic infection, anxiety and irritability, hypertension, diabetes, other lab abnormalities.  Explained the upset benefit of potentially improving upon current ILD active inflammatory phase.  He is willing to go through this treatment.  He prefers outpatient.  He will require lab and vital sign monitoring.   his wife wanted to know if this could be sarcoidosis.  She is read some case reports of sarcoidosis and myeloma associated.  I expressed to them the clinical features do not fit in with sarcoidosis.  However we can check for  autoimmune and sarcoid features  Results for TDAXTIN, LEIKER (MRN [id number] as of 08/31/2021 16:38 ENVISIA - NEGATIVE FOR UIP  Ref. Range 06/18/2021 12:57 08/28/2021 16:51  Monocyte-Macrophage-Serous Fluid Latest Ref Range: 50 - 90 % 5 (L) 10 (L)  Other Cells, Fluid Latest Units: % CORRELATE WITH CYTOLOGY. MESOTHELIAL AND BRONCHIAL LINING CELLS  Color, Fluid Latest Ref Range: YELLOW  PINK (A) COLORLESS (A)  Total Nucleated Cell Count, Fluid Latest Ref Range: 0 - 1,000 cu mm 103 183  Fluid Type-FCT Unknown BRONCHIAL ALVEOLAR LAVAGE BRONCHIAL ALVEOLAR LAVAGE  Lymphs, Fluid Latest Units: % 18 40  Eos, Fluid Latest Units: % 51 0  Appearance, Fluid Latest Ref Range: CLEAR  HAZY (A) CLEAR (A)  Neutrophil Count, Fluid Latest Ref Range: 0 - 25 % 26 (H) 50 (H)    CT Chest data  DG CHEST PORT 1 VIEW  Result Date: 08/28/2021 CLINICAL DATA:  Status post bronchoscopy. EXAM: PORTABLE CHEST 1 VIEW COMPARISON:  Chest radiographs 07/20/2021 and CT 07/30/2021 FINDINGS: The cardiac silhouette is borderline enlarged. Lung volumes are chronically low and slightly lower than on the prior radiographs. The interstitial markings are chronically increased diffusely. No definite acute airspace consolidation, overt pulmonary edema, sizable pleural effusion, or pneumothorax is identified. Prominent gaseous distension of the stomach is partially visualized. IMPRESSION: Low lung volumes with chronic interstitial changes. Electronically Signed   By: Logan Bores M.D.   On: 08/28/2021 19:10   DG C-ARM BRONCHOSCOPY  Result Date: 08/28/2021 C-ARM BRONCHOSCOPY: Fluoroscopy was utilized by the requesting physician.  No radiographic interpretation.       OV 09/25/2021  Subjective:  Patient ID: Billee Cashing, male , DOB: 19-Apr-1956 , age 37 y.o. , MRN: [id number] , ADDRESS: [place] [id number] PCP Antony Contras, MD Patient Care Team: Antony Contras, MD as PCP - General (Family  Medicine)  This Provider for this visit: Treatment Team:  Attending Provider: Brand Males, MD    09/25/2021 -   Chief Complaint  Patient presents with   Follow-up    Pt states that he is beginning to feel better after last visit. States he wears his O2 at 2L majority of the time.  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 JNarda Rutherford- 461monce weekly (for multiple myeloma chemotherapy N/V ppx)   04/09/21 - dexamethasone 50m49mabs - 40 tabs for 28 day supply - Dr. JohNarda Rutherford10m11mce weekly  (for multiple myeloma chemotherapy N/V ppx)   05/07/21 - dexamethasone 50mg 51ms - 40 tabs for 28 day supply - Dr. John Narda Rutherfordmg 101m weekly  (for multiple myeloma chemotherapy N/V ppx)   06/06/21 - dexamethasone 50mg ta15m- 40 tabs for 28 day supply - Dr. John DoNarda Rutherfordeased to 20mg on77meekly  06/18/21 - prednisone 10mg tab62m104 tabs for 34 day supply - Dr. Jennifer Dessa Phimg dail38m7 days, 40 mg daily x 7 days, 30 mg daily x 7 days, 20mg daily36m days, 10mg daily 71mdays, 5mg daily x 90mays   07/13/21 - prednisone 10mg tabs - 270mab for 30 day supply - Dr. Iridiana Fonner ----Chase Callerily x 153mys, 40 mg daily x 14 days, 30mg daily x 1468ms, 20mg daily there35mr  - Mid Nov 2022  - 1gm solumedrol load x 3 days  as outpatient  - 09/25/2021 - 59m pred per day   HPI VGaberial CadaTosco 66y.o. -returns for follow-up.  Since his last visit we did a loading dose of Solu-Medrol 1 g daily x3 days.  This was in mid November 2022.  After that he is gone back to daily prednisone 20 mg/day.  In the midst of the high-dose steroid he did pick up hypertension and we gave him bisoprolol which he says has helped him significantly.   He is run out of the bisoprolol.  I have asked him to contact his primary care physician to manage his hypertension but we will give him a refill.    He is here for follow-up to see his current status.  He tells me that his effort tolerance is better.  He tells me in the gym he is able to do a little bit more work.  This is compared to a few months ago.  He does tell me that gym exercises are easier on him than climbing the stairs.  Stairs - he avoids and gets dyspneic. Not tested his pulse ox on stairs His subjective symptom profile is slightly better compared to October 2022 but his walking desaturation test is around the same.   So suspect amount of his interstitial lung disease might be better but suspect still remains.  Review of his pulmonary function test from 10 years ago was normal.  In April 2022 he had early ILD.  He currently definitely has ILD.  His RNA genomic classifier is negative.  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  This would make him eligible for nintedanib. He continues on prednisone 20 mg/day.  In terms of his myeloma: He had his wife say that it is still under remission but they are worried about relapse.  They are worried about future direction and treatment of myeloma particularly because he has had issues with treatment that then resulted in acute lung injury.   Of note  - he is frustrated by poor customer service of our office workflows - cChristella Scheuermanndenies his RNA genomic classifer biopsy    OV 10/19/21  S: call to give update on conversation with Dr DLorenso Courierhis hematologist  1. Myeloma - latest dec 2022 blood work back - still under remission. Dr DLorenso Courierindicated that highly unlikely he will be a BMT Candidate for myeloma if his lung. [facility] If not a [facility] candidate - then cytoxan regimen short term would be used (indicated that is of ptioential benefit to lungs)  2. 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 Would need SLB to ID etiology preciesly but with myelom and Thereapuetic trial with steroids  and his presentation - this has not been a consideratgion till now.  Explained to Dr DLorenso Courierif ILD progresses futher - patient life expectancy is limited. Discussed with patient again and he is agreeable for prednisone 166mper day and starting ofev (awaiting donor samples and insurance proces in 2023).   3. WE discussed [facility] to see if lung transplant would be an option at all if he were to decline esp in  setting of myeloma. Maybe a BMT as well . Do not know answers but wil email Dr Doy Mince at [facility] to get patient in for visit. He might well need a surgical lung biopsy but this can be addressed in due course  Patient and wife agreeable  I spent time emailing Dr Serita Grit trasnplant doc at [facility] - later heard from Dr Doy Mince- feels that Myleoma will need to be in remssion for >= 5 years before he can be considered lung transplant evaluable. They feel no need to see Mr Dubin in transplant clinicl. I subsequetly d/w Mr Zulauf - will revert back to holding off lung transplant evaluation. He wil proceed with ofev. He will see [facility]MT team. Will cosndier a [facility] opinion after d;w hi     2nd Pulm Opinion at [facility] - Dr Kathi Ludwig    Comment: The patient seems to have an inflammatory process that has resulted in progressive loss of lung function, worsening hypoxia. Unfortunately on the most recent imaging I do see some signs of fibrosis including a few areas of traction bronchiectasis although I do not see honeycombing or profuse traction bronchiectasis. We did discuss  future therapies. I told him that unfortunately since his lung has already suffered injury I think he would be at risk for developing lung injury again and that many chemotherapeutics have been associated lung injury. I also told him these are idiosyncratic reactions and it was not possible to predict who on an individual basis would develop inflammation from chemotherapy to any specific agent. He brought up Cytoxan I told him that while Cytoxan issues for inflammatory lung disease on the other hand it has been associated with pulmonary inflammation. His inflammatory process is steroid responsive which is not unexpected given that eosinophils were found on BAL.  We will check oxygen assessment with exercise to see how much oxygen as needed with more than ordinary exertion.  Plan:    - Check oxygen assessment  Will discuss with his pulmonary provider Dr. [name] and then Dr. Feliciana Rossetti  Thank you for the opportunity to provide consultation for your patient. If I can be of further assistance please do not hesitate to contact my office.   [place] [facility]   ASSESSMENT / PLAN  1. Interstitial lung disease-severe DLCO reduction (33% predicted) 2. 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 [name] induced)-started August 2022 3. Faint/early ILD present in April 2022 (even prior to starting myeloma treatment) 4. Hypothyroidism-on replacement with Armour thyroid 5. Dyslipidemia 6. History of gout 7. Chronic allergic rhinitis and some sinusitis-on nasal steroids 8. Childhood history of asthma 9. 25 year history of allergy shots (between ages 26 and 76)  2. Multiple myeloma-diagnosed April 2022-received about 5 cycles of [name]/[name]/dexamethasone until August 2022    There definitely appeared to be some very early changes of ILD even back in April 2022 prior to him starting any of his myeloma treatments. However, there definitely appears to be an acute interstitial pneumonitis type  picture noted on his CT scan from 06/15/2021. He was taken off all myeloma treatments at that time with concern for drug-induced pneumonitis with both [name] as well as [name] being considered probably agents. He subsequently underwent 1 additional cycle of myeloma treatment with [name] alone (without [name]) and had an acute exacerbation of dyspnea symptoms and ever since then has not received any further myeloma treatment.   I discussed with him that he has been on steroids since about  August 2022 and received a steroid bolus in November 2022 with very little change overall in his lung functions. I have mentioned his serial lung function numbers in the HPI above. I do not see any significant change in these numbers despite him receiving excellent care with Dr. [name] in [place].   His CT chest currently shows mostly fibrotic changes with maybe some active inflammatory component still left (although the extent of active inflammation appears to be very little as compared to the initial scan from August 2022).   I discussed with him that we have very little in terms of treatments to offer him. I do not think the addition of an IL 5 inhibitor or dupilumab would be of any benefit in this situation given that his BAL eosinophil count in November was down to 0% on steroids along with the fact that his current CT scan does not show much for active inflammation. Dr. [name] had discussed Cytoxan with him, but I think that that would be somewhat aggressive given that he is already got an active bone marrow problem with the myeloma and so I would discourage him from getting Cytoxan.  In terms of treatment options, we are really left with only 1 option which is to rechallenge him with high-dose steroids for the next 6-8 weeks and then reassess both his CT scan as well as pulmonary function studies. With this in mind, I have the following plan for him:  1. Increase oral prednisone from his 10 mg a day  that he has been currently using for the past month or so to 60 mg a day. I have outlined to taper for him on the prescription and he will see me back on the 30th of May when he is down to 50 mg a day of prednisone.   2. Continue Bactrim PCP prophylaxis. I have refilled the prescription for him.   3. Continue oxygen supplementation  4. Await the recommendations of our myeloma colleagues in Hematology  5. We cannot refer him for lung transplantation given the active diagnosis of multiple myeloma. This was discussed with him.  Overall, I told him that we likely are dealing with end-stage interstitial lung disease with very little in terms of active inflammation and something that is reversible even with the prednisone challenge I have outlined. He was given a prescription for Ofev by Dr. [name], but could not tolerate that due to diarrhea, nausea as well as vomiting. He has an active prescription for Esbriet, but I told him to hold off on that for the next 8 weeks while he is undergoing the prednisone trial so that we do not have any issues with him throwing up on the prednisone and potentially confusing the assessment of treatment response.  If we do not see any improvement in his lung function on CT scan on May 30th, we will slowly taper and discontinue the prednisone and have him start the Virgil.  With regards to multiple myeloma treatment going forward, I would definitely not challenge him with [name]. I am not so sure about the [name] and if that needs to be given to him in the future, we could potentially consider that under the cover of prednisone immunosuppression. This will need to be careful discussion between his Hematology and Pulmonary team given his severe interstitial lung disease in the lack of any wiggle room if he were to decline/have a flare of pneumonitis.  All questions were answered. The patient was satisfied with the visit. No learning  barriers identified during the  visit.  OV 12/04/2021  Subjective:  Patient ID: Billee Cashing, male , DOB: 01-Aug-1956 , age 64 y.o. , MRN: [id number] , ADDRESS: [place] [id number] PCP Antony Contras, MD Patient Care Team: Antony Contras, MD as PCP - General (Family Medicine)  This Provider for this visit: Treatment Team:  Attending Provider: Brand Males, MD  History of COVID-19 in the April  2022 Undiagnosed early ILD in the April 2022 Drug-induced interstitial lung disease - progressive phenotype - Aug 2022 BAL: pulm eosinophilia 51% in the setting of multiple myeloma chemotherapy - aug 2022  - Nov 2022 - BAL 40% lympocytoss 0% eos  - declined by [facility] for transplant eval dec 2022 - needs 5 year sof myelmoma remission    Prednisone history: 03/05/21 - dexamethasone 133m tabs - 40 tabs for 28 day supply - Dr. JNarda Rutherford- 469monce weekly (for multiple myeloma chemotherapy N/V ppx)   04/09/21 - dexamethasone 33m57mabs - 40 tabs for 28 day supply - Dr. JohNarda Rutherford89m33mce weekly  (for multiple myeloma chemotherapy N/V ppx)   05/07/21 - dexamethasone 33mg 28ms - 40 tabs for 28 day supply - Dr. John Narda Rutherfordmg 46m weekly  (for multiple myeloma chemotherapy N/V ppx)   06/06/21 - dexamethasone 33mg ta73m- 40 tabs for 28 day supply - Dr. John DoNarda Rutherfordeased to 20mg on533meekly  06/18/21 - prednisone 10mg tab35m104 tabs for 34 day supply - Dr. Jennifer Dessa Phimg dail89m7 days, 40 mg daily x 7 days, 30 mg daily x 7 days, 20mg daily533m days, 10mg daily 733mdays, 5mg daily x 25mays   07/13/21 - prednisone 10mg tabs - 215mab for 30 day supply - Dr. Lakynn Halvorsen ----Chase Callerily x 110mys, 40 mg daily x 14 days, 30mg daily x 1480ms, 20mg daily there56mr  - Mid Nov 2022  - 1gm solumedrol load x 3 days as outpatient  - 09/25/2021 - 20mg pred per day433mLate December 2022/early January 2023: Start nintedanib - 12/04/21 -prednisone 15mg per day      53m/2023 -   Chief Complaint   Patient presents with   Follow-up    Pt states he stopped taking the OFEV a week ago due to having problems with diarrhea, nausea, and some bleeding still. States since he stopped taking it, he has not had any diarrhea the past 2 days and states the bleeding stopped 2 days ago.     HPI Laythan J Kirstein 65 Al[address] Knealeurns fo40follow-up with his wife.  He tells me that in terms of his multiple myeloma [facility] and since then is followed with Dr. Dorsey.  He also sa[facility]orenso [facility]olfo [facility] this month 2023.  The general feeling that I get is that they are very nervous to do any form of chemotherapy in him for fear of exacerbating his lung disease.  He is extremely worried about this approach.  There is also trepidation about Cytoxan.  He is really worried about what to do if his myeloma came back.  On the other hand he is also worried about his lungs.  He said he did talk to her known physician call Nathan [place] use 10 L of oxygen with  exercise.  He wants higher dose concentrator for this.  I was willing to prescribe this.  He is willing to even pay for this out-of-pocket.  He is also recommended sudden breathing exercises.  Patient is trying different breathing exercises with the hope his lungs can heal.  He is aware that he might be dealing with progressive issues.  He was on nintedanib for a month and early February 2023 he called saying he was having diarrhea and also bloody stools.  He has now stopped nintedanib and for the last week he has not had a diarrhea.  For the last few days there is no bloody stools.  He does not want to do this drug again.  The side effects were quite bad.  There is no further bleeding.  He also believes his hypertension resolved or improved after stopping nintedanib.  He did have a colonoscopy 5  years ago and since then has not had any problems.  His next colonoscopy might be in another 5 years.  At this point in time he is able to do treadmill exercise on 5 L and walk 30 minutes 1.7 mph.  Nevertheless when we walked him on room air here in office he desaturated quickly.  It seems like his distance to desaturation is gotten worse.  He is worried about both the myeloma and the interstitial lung disease.  We discussed options about getting further opinions.  He says he wants to go to a place with his extreme expertise about this.  We discussed the idea about having to go out of state including [place], [place] [facility] and the [facility].  I do remember a name of Dr. Stefanie Libel who is professor at [facility] who is an Energy manager in myeloma.  I did mention this name to him.  I have also written to him.  I also written to 1 Dr. Judie Grieve at the pulmonary department at the [facility].  Based on the response we will facilitate a referral.  Meanwhile I did tell him that we need to protect his lungs against fibrosis.  He wants to go down on his prednisone to 10 mg/day because of the side effects of weight gain.  I agreed to do this provisionally.  But I also recommended antifibrotic pirfenidone.  We discussed the side effects of nausea anorexia and occasionally diarrhea.  He wants to reflect on this.  He is willing to meet with the pharmacist on this.  I made a referral.   No results found.    PFT  OV 01/24/2022  Subjective:  Patient ID: Billee Cashing, male , DOB: Jan 08, 1956 , age 42 y.o. , MRN: [id number] , ADDRESS: [place] [id number] PCP Antony Contras, MD Patient Care Team: Antony Contras, MD as PCP - General (Family Medicine)  This Provider for this visit: Treatment Team:  Attending Provider: Brand Males, MD    01/24/2022 -   Chief Complaint  Patient presents with   Follow-up    PFT performed 12/10/21. Pt recently had a referral with [facility].  Pt  states he has been doing okay since last visit   Drug-induced pneumonitis follow-up interstitial lung disease  HPI Clara Smolen Embleton 66 y.o. -reviewed [facility] notes.  Dr. Judie Grieve also called me last week.  Decided to go with high-dose prednisone because of some groundglass opacities.  Did not want to do concomitant pirfenidone because of potential side effect profile.  Patient has upcoming follow-up appointment Mar 09, 2022 at  [facility] with Dr. Judie Grieve.  Also saw the myeloma specialist.  In case of recurrence some alternatives have been recommended.  He tells me that he is getting more optimistic.  He understands the significance of his disease in the severely but he is doing breathing exercises remaining optimistic.  He is trying an organic diet.  And therefore his positive state of mind is making him feel better.  He does workout on a treadmill at 2.2 mph.  He covers 1.4 miles in 40 minutes.  He uses 8 L of oxygen for this.   Subjective symptom assessment score is documented below in the slightly better from last visit and significantly better compared to October 2022.  Walking desaturation test is improved from 2 months ago but is similar to follow-up 2022  PFT   OV 04/09/2022  Subjective:  Patient ID: Billee Cashing, male , DOB: 1956-03-04 , age 32 y.o. , MRN: [id number] , ADDRESS: [place] [id number] PCP Antony Contras, MD Patient Care Team: Antony Contras, MD as PCP - General (Family Medicine)  This Provider for this visit: Treatment Team:  Attending Provider: Brand Males, MD    04/09/2022 -   Chief Complaint  Patient presents with   Follow-up    Pt states he has been doing okay since last visit but states he did get covid 6/6 and then after that he has had a cough.     HPI Clarnce Homan Yeung 67 y.o. 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  He had a second visit to [facility].  He saw  Dr. Ladon Applebaum for pulmonary.  In terms of his pulmonary status things were deemed to be stable.  He had lung function studies.  His FVC is actually improved to 1.68 L on 03/19/2022 and his DLCO is stable around 10.3./Slightly improved.  Overall his pulmonary function test is stable/trending in the right direction.  He is lost significant amount of weight it is if his improvement could be because of that.  However he was feeling subjectively improved.  According to the [facility] notes pirfenidone is not being recommended because he is stable but he tells me that the decision was left up to him.  Certainly they want him to preserve his lung function and lose more weight.  Today wanted to talk about pirfenidone but in the interim immediately after coming back March 27, 2022 he emailed me saying that he had [place].  He believes he got it at [facility].  He has taken antiviral.  After this he is better but he still having some slightly worse dyspnea on exertion than baseline.  Some slightly more subjective use of oxygen at baseline [baseline exertional pulse ox stable/slightly worse].  More coughing and wheezing than baseline.  In terms of his prednisone therapy for his ILD and this is being tapered currently 7.5 mg/day.  [facility] Dr. Judie Grieve has on a tapering regimen to off but in the middle of this he had increased respiratory symptoms.  Wife is also reporting more sciatica as the prednisone is coming down.  In terms of his hematology he has been seen by [facility] multiple myeloma program and they made specific recommendations that I reviewed.  They are going to be in touch with his local hematologist oncologist Dr. [name] but currently under remission.  Weight loss has been emphasized.  We discussed Ozempic for weight loss.      SYMPTOM SCALE -  ILD 08/09/2021 09/25/2021 218# 12/04/2021 222# 01/24/22 222# 04/09/2022 215#  Current weight    Treadmill 2.2 mph.  As 1.4 miles over 40 minutes.  Uses 8 L oxygen  Post may 2nd visit 03/27/22 -2ndcvoid  O2 use ra ra ra ra 2L eert  Shortness of Breath 0 -> 5 scale with 5 being worst (score 6 If unable to do)      At rest 0 0 0 0 0  Simple tasks - showers, clothes change, eating, shaving _0 0/5 0  Household (dishes, doing bed, laundry) x na _1 Shopping 3 1 1.5 0/5 1  Walking level at own pace _2 Walking up Stairs _3 3.5 3  Total (30-36) Dyspnea Score 14 9 8._4 How bad is your cough? 3 1 0.5  3.4  How bad is your fatigue 0 2 0  3  How bad is nausea 0 0 0  0  How bad is vomiting?  0 0 0  0  How bad is diarrhea? 0 0 0  0  How bad is anxiety? _5 0.5  How bad is depression _6 0.5  Any chronic pain - if so where and how bad x x x  x     Simple office walk 185 feet x  3 laps goal with forehead probe 07/12/2021  08/09/2021  09/25/2021  12/04/2021  01/24/2022  04/09/2022   O2 used ra Ra3 ra ra ra ra  Number laps completed 3 3 but did oly _7 attempted but stopped at 2 due to deats All 3 las Stopped at 2.5L Laps  Comments about pace slow slow slow slow Avg pace Avg   Resting Pulse Ox/HR 100% and 69/min 100%ad 74 98% and 75 99% RA and59 98% and HR 60 98% and HR 54  Final Pulse Ox/HR 92% and 81/min 93% and 87 91% and 91 86% RA and 85 91% and HR 80 (brief 89%) 88% and HR 80  Desaturated </= 88% no no no yes ni   Desaturated <= 3% points Yes 8 Yes, 7 pponts Yes, 7 points Yes, 13 poins Yes 7 points Yes, 10 points  Got Tachycardic >/= 90/min no no yes no no   Symptoms at end of test No complaints Mod-severe dyspnea Mild dyspnea No complaints none Seemed ok  Miscellaneous comments x Worse? stable ? worse     CT Chest data CT Chest data  No results found.    PFT     Latest Ref Rng & Units 03/19/2022 at [facility] 12/10/2021    9:24 AM 10/16/2021    3:56 PM 06/15/2021   10:13 PM  PFT Results  FVC-Pre L 1.68 1.49  1.35  1.55   FVC-Predicted Pre %  32  29  33   FVC-Post L    1.45   FVC-Predicted Post %    31   Pre  FEV1/FVC % %  94  92  83   Post FEV1/FCV % %    83   FEV1-Pre L  1.40  1.24  1.28   FEV1-Predicted Pre %  40  36  37   FEV1-Post L    1.20   DLCO uncorrected ml/min/mmHg  8.64  9.99  12.00   DLCO UNC% %  32  37  44   DLCO corrected ml/min/mmHg 10.32 9.20  10.04  12.52   DLCO COR %  Predicted %  34  37  46   DLVA Predicted %  97  109  118   TLC L    4.18   TLC % Predicted %    59   RV % Predicted %    95        has a past medical history of Asthma, GERD (gastroesophageal reflux disease), High cholesterol, History of blood transfusion, Hypothyroidism, Interstitial lung disease ([place]), Multiple myeloma ([place]), Perennial allergic rhinitis, Pneumonia, Sciatic pain, right, Seasonal allergic rhinitis, and Thyroid disease.   reports that he quit smoking about 39 years ago. His smoking use included cigarettes. He has a 0.30 pack-year smoking history. He has never used smokeless tobacco.  Past Surgical History:  Procedure Laterality Date   BRONCHIAL BIOPSY  06/18/2021   Procedure: BRONCHIAL BIOPSIES;  Surgeon: Collene Gobble, MD;  Location: [facility];  Service: Cardiopulmonary;;   BRONCHIAL BIOPSY  08/28/2021   Procedure: BRONCHIAL BIOPSIES;  Surgeon: Garner Nash, DO;  Location: [place];  Service: Cardiopulmonary;;   BRONCHIAL WASHINGS  06/18/2021   Procedure: BRONCHIAL WASHINGS;  Surgeon: Collene Gobble, MD;  Location: [facility];  Service: Cardiopulmonary;;   BRONCHIAL WASHINGS  08/28/2021   Procedure: BRONCHIAL WASHINGS;  Surgeon: Garner Nash, DO;  Location: [place];  Service: Cardiopulmonary;;   NASAL SINUS SURGERY     NECK SURGERY  1996   VIDEO BRONCHOSCOPY N/A 06/18/2021   Procedure: VIDEO BRONCHOSCOPY WITH FLUORO;  Surgeon: Collene Gobble, MD;  Location: [facility];  Service: Cardiopulmonary;  Laterality: N/A;   VIDEO BRONCHOSCOPY N/A 08/28/2021   Procedure: VIDEO BRONCHOSCOPY WITH FLUORO;  Surgeon: Garner Nash, DO;  Location: [place];  Service:  Cardiopulmonary;  Laterality: N/A;    Allergies  Allergen Reactions   Clarithromycin Other (See Comments)    Hiccups   Ofev [Nintedanib] Other (See Comments)    GI bleeding   Penicillins Other (See Comments)    Child hood    Immunization History  Administered Date(s) Administered   Fluad Quad(high Dose 65+) 08/09/2021   Influenza Split 10/27/2017   Influenza, High Dose Seasonal PF 12/01/2017, 11/29/2019, 12/19/2020, 12/24/2021   Influenza, Quadrivalent, Recombinant, Inj, Pf 07/28/2019, 08/11/2020   Influenza-Unspecified 11/26/2011, 07/21/2017   PFIZER(Purple Top)SARS-COV-2 Vaccination 12/27/2019, 01/24/2020, 09/21/2020   Tdap 08/20/2005, 09/07/2015   Zoster, Live 11/06/2017, 05/07/2018    Family History  Problem Relation Age of Onset   Hypertension Mother    Allergies Mother    Allergies Father    CAD Father 38   Breast cancer Paternal Grandmother    Hypertension Other    Heart disease Other      Current Outpatient Medications:    acetaminophen (TYLENOL) 500 MG tablet, Take 1,000 mg by mouth every 6 (six) hours as needed for mild pain, moderate pain or headache., Disp: , Rfl:    acyclovir (ZOVIRAX) 400 MG tablet, Take 1 tablet (400 mg total) by mouth 2 (two) times daily., Disp: 60 tablet, Rfl: 3   albuterol (PROVENTIL) (2.5 MG/3ML) 0.083% nebulizer solution, Take 3 mLs (2.5 mg total) by nebulization every 6 (six) hours as needed for wheezing or shortness of breath., Disp: 120 mL, Rfl: 12   albuterol (VENTOLIN HFA) 108 (90 Base) MCG/ACT inhaler, Inhale 2 puffs into the lungs every 6 hours as needed for wheezing or shortness of breath., Disp: 8.5 g, Rfl: 1   ALPRAZolam (XANAX) 0.5 MG tablet, Take 0.5 mg by mouth at bedtime., Disp: , Rfl:    ASSESS FULL  RANGE PEAK METER DEVI, as directed., Disp: , Rfl:    bisoprolol (ZEBETA) 5 MG tablet, Take 1 tablet (5 mg total) by mouth daily., Disp: 30 tablet, Rfl: 2   cholecalciferol (VITAMIN D) 1000 UNITS tablet, Take 3,000 Units by  mouth daily., Disp: , Rfl:    EPINEPHrine (EPI-PEN) 0.3 mg/0.3 mL DEVI, Inject 0.3 mg into the muscle as needed., Disp: , Rfl:    ezetimibe-simvastatin (VYTORIN) 10-40 MG per tablet, Take 1 tablet by mouth at bedtime., Disp: , Rfl:    fluticasone-salmeterol (ADVAIR HFA) 230-21 MCG/ACT inhaler, Inhale 2 puffs into the lungs 2 (two) times daily. Brand name only, Disp: 1 each, Rfl: 12   furosemide (LASIX) 20 MG tablet, TAKE ONE TABLET BY MOUTH AS NEEDED FOR FLUID OR EDEMA, Disp: 14 tablet, Rfl: 1   ketoconazole (NIZORAL) 2 % cream, SMARTSIG:1 Application Topical 1 to 2 Times Daily, Disp: , Rfl:    montelukast (SINGULAIR) 10 MG tablet, Take 10 mg by mouth at bedtime., Disp: , Rfl:    Multiple Vitamin (MULTIVITAMIN) tablet, Take 1 tablet by mouth daily., Disp: , Rfl:    omeprazole (PRILOSEC) 40 MG capsule, Take 1 capsule by mouth daily., Disp: , Rfl:    OXYGEN, Inhale 2 L into the lungs continuous., Disp: , Rfl:    predniSONE (DELTASONE) 10 MG tablet, Take 4 tablets (40 mg total) by mouth daily with breakfast for 2 days, THEN 3 tablets (30 mg total) daily with breakfast for 2 days, THEN 2 tablets (20 mg total) daily with breakfast for 2 days, THEN 1 tablet (10 mg total) daily with breakfast for 2 days. Then begin taking 7.6m.., Disp: 20 tablet, Rfl: 0   predniSONE (DELTASONE) 5 MG tablet, Once finish with 136mfor 2 days, take 7.64m76mDisp: 30 tablet, Rfl: 2   sildenafil (REVATIO) 20 MG tablet, Take 20 mg by mouth daily as needed (ED)., Disp: , Rfl:    Simethicone (SIMETHICONE ULTRA STRENGTH) 180 MG CAPS, Take 1 capsule (180 mg total) by mouth 3 (three) times daily as needed., Disp: 90 capsule, Rfl: 0   sodium chloride (OCEAN) 0.65 % SOLN nasal spray, Place 1 spray into both nostrils as needed for congestion., Disp: , Rfl:    tamsulosin (FLOMAX) 0.4 MG CAPS capsule, Take 0.4 mg by mouth daily., Disp: , Rfl:    thyroid (ARMOUR) 90 MG tablet, Take 1 tablet by mouth daily., Disp: , Rfl:    triamcinolone  cream (KENALOG) 0.1 %, Apply 1 application topically daily as needed (sun burn itch)., Disp: , Rfl:       Objective:   Vitals:   04/09/22 1159  BP: 110/70  Pulse: (!) 53  Temp: 98.2 F (36.8 C)  TempSrc: Oral  SpO2: 98%  Weight: 215 lb 12.8 oz (97.9 kg)  Height: _0  (1.778 m)    Estimated body mass index is 30.96 kg/m as calculated from the following:   Height as of this encounter: _1  (1.778 m).   Weight as of this encounter: 215 lb 12.8 oz (97.9 kg).  _2 @  Filed Weights   04/09/22 1159  Weight: 215 lb 12.8 oz (97.9 kg)     Physical Exam    General: No distress.  Looks well.  Slightly thinner Neuro: Alert and Oriented x 3. GCS 15. Speech normal Psych: Pleasant Resp:  Barrel Chest - no.  Wheeze - no, Crackles - no, No overt respiratory distress CVS: Normal heart sounds. Murmurs - no Ext: Stigmata of Connective Tissue Disease -  no HEENT: Normal upper airway. PEERL +. No post nasal drip        Assessment:       ICD-10-CM   1. ILD (interstitial lung disease) ([place])  J84.9 Pulmonary function test    Ambulatory Referral for DME    2. Asthma, unspecified asthma severity, unspecified whether complicated, unspecified whether persistent  J45.909 Ambulatory Referral for DME         Plan:     Patient Instructions  ILD (interstitial lung disease) ([place]) Drug-induced pneumonitis Chronic respiratory failure with hypoxia ([place]) Encounter for medication counseling  Glad 2nd [facility] visit with Dr Judie Grieve went well Noted plan is for prednisone taper to off and decision on esbiret left to you Dr Judie Grieve did not favor lung biopsy  Dr Judie Grieve did not favor cytoxan at thhis time  However, March 27 2022 with 2nd covid. Some more cough after that Un[place] based on walk test if your ILD is flare up -   Plan  - Alb neb prn with neb machine for dyspnea  - Take prednisone 40 mg daily x 2 days, then prednisone 70m daily x 2 days, then 232mdaily x 2 days, then 1021maily  x 2 days, then 7.5mg53mseline dose that you will [facility] for goal pulse ox > 88% - [facility] appt end May 2023  Obesity (BMI 30.0-34.9)  -get myfitness pal app and slowly lose weight - glad there is weight loss  - some people believe that vegeterian diets are best (DeaMarlou Saish, CAldwell Esseltyn) - [facility]y [facility]    [place] [facility] [place] Dors[place]urgess [place]/dlco in 4-6 weeks Kesha Hurrell or APP in  4-6 weeks  - symptoms/wak test at followu  - esbriet conversation at followup  Highly complex medical condition requiring intensive therapeutic monitoring  SIGNATURE    Dr. MuraBrand MalesD., F.C.C.P,  Pulmonary and Critical Care Medicine Staff Physician, [facility] - Interstitial Lung Disease  Program  Pulmonary [place], [id number]: [phone] no answer or between  15:00h - 7:00h: call [phone] Telephone: [phone]  1:02 PM 04/09/2022

## 2022-04-11 ENCOUNTER — Telehealth: Payer: Self-pay | Admitting: Internal Medicine

## 2022-04-11 NOTE — Telephone Encounter (Signed)
Order was placed after pt's OV with MR 6/20. Routing to PCCs to make sure this had been taken care of.

## 2022-04-11 NOTE — Telephone Encounter (Signed)
Called [employer] & spoke to Newell.  She is working on order now and is going to call the pt.  Nothing further needed.

## 2022-04-11 NOTE — Telephone Encounter (Signed)
Samuel [name] will you please check on this, please?

## 2022-04-11 NOTE — Telephone Encounter (Signed)
He emailed saying that he is not sure if the neb machine has been orderd for his alb neb

## 2022-04-17 ENCOUNTER — Other Ambulatory Visit: Payer: Self-pay

## 2022-04-17 ENCOUNTER — Inpatient Hospital Stay: Payer: Medicare Other

## 2022-04-17 ENCOUNTER — Inpatient Hospital Stay: Payer: Medicare Other | Attending: Hematology and Oncology

## 2022-04-17 ENCOUNTER — Other Ambulatory Visit: Payer: Self-pay | Admitting: Hematology and Oncology

## 2022-04-17 ENCOUNTER — Inpatient Hospital Stay (HOSPITAL_BASED_OUTPATIENT_CLINIC_OR_DEPARTMENT_OTHER): Payer: Medicare Other | Admitting: Hematology and Oncology

## 2022-04-17 VITALS — BP 150/95 | HR 74 | Temp 97.6°F | Resp 18

## 2022-04-17 DIAGNOSIS — C9000 Multiple myeloma not having achieved remission: Secondary | ICD-10-CM | POA: Insufficient documentation

## 2022-04-17 DIAGNOSIS — M899 Disorder of bone, unspecified: Secondary | ICD-10-CM | POA: Diagnosis not present

## 2022-04-17 LAB — CBC WITH DIFFERENTIAL (CANCER CENTER ONLY)
Abs Immature Granulocytes: 0.04 10*3/uL (ref 0.00–0.07)
Basophils Absolute: 0 10*3/uL (ref 0.0–0.1)
Basophils Relative: 0 %
Eosinophils Absolute: 0 10*3/uL (ref 0.0–0.5)
Eosinophils Relative: 0 %
HCT: 39.8 % (ref 39.0–52.0)
Hemoglobin: 13.8 g/dL (ref 13.0–17.0)
Immature Granulocytes: 0 %
Lymphocytes Relative: 7 %
Lymphs Abs: 0.8 10*3/uL (ref 0.7–4.0)
MCH: 31.7 pg (ref 26.0–34.0)
MCHC: 34.7 g/dL (ref 30.0–36.0)
MCV: 91.3 fL (ref 80.0–100.0)
Monocytes Absolute: 0.3 10*3/uL (ref 0.1–1.0)
Monocytes Relative: 3 %
Neutro Abs: 9.6 10*3/uL — ABNORMAL HIGH (ref 1.7–7.7)
Neutrophils Relative %: 90 %
Platelet Count: 209 10*3/uL (ref 150–400)
RBC: 4.36 MIL/uL (ref 4.22–5.81)
RDW: 13.2 % (ref 11.5–15.5)
WBC Count: 10.7 10*3/uL — ABNORMAL HIGH (ref 4.0–10.5)
nRBC: 0 % (ref 0.0–0.2)

## 2022-04-17 LAB — CMP (CANCER CENTER ONLY)
ALT: 30 U/L (ref 0–44)
AST: 26 U/L (ref 15–41)
Albumin: 3.9 g/dL (ref 3.5–5.0)
Alkaline Phosphatase: 34 U/L — ABNORMAL LOW (ref 38–126)
Anion gap: 8 (ref 5–15)
BUN: 17 mg/dL (ref 8–23)
CO2: 25 mmol/L (ref 22–32)
Calcium: 9.1 mg/dL (ref 8.9–10.3)
Chloride: 99 mmol/L (ref 98–111)
Creatinine: 0.92 mg/dL (ref 0.61–1.24)
GFR, Estimated: >60 mL/min (ref >60)
Glucose, Bld: 147 mg/dL — ABNORMAL HIGH (ref 70–99)
Potassium: 4.4 mmol/L (ref 3.5–5.1)
Sodium: 132 mmol/L — ABNORMAL LOW (ref 135–145)
Total Bilirubin: 0.3 mg/dL (ref 0.3–1.2)
Total Protein: 6.9 g/dL (ref 6.5–8.1)

## 2022-04-17 LAB — LACTATE DEHYDROGENASE: LDH: 235 U/L — ABNORMAL HIGH (ref 98–192)

## 2022-04-17 MED ORDER — ZOLEDRONIC ACID 4 MG/100ML IV SOLN
4.0000 mg | Freq: Once | INTRAVENOUS | Status: AC
  Administered 2022-04-17: 4 mg via INTRAVENOUS
  Filled 2022-04-17: qty 100

## 2022-04-17 MED ORDER — SODIUM CHLORIDE 0.9 % IV SOLN: Freq: Once | INTRAVENOUS | Status: AC

## 2022-04-17 NOTE — Patient Instructions (Signed)

## 2022-04-18 LAB — KAPPA/LAMBDA LIGHT CHAINS
Kappa free light chain: 17 mg/L (ref 3.3–19.4)
Kappa, lambda light chain ratio: 2.36 — ABNORMAL HIGH (ref 0.26–1.65)
Lambda free light chains: 7.2 mg/L (ref 5.7–26.3)

## 2022-04-23 LAB — MULTIPLE MYELOMA PANEL, SERUM
Albumin SerPl Elph-Mcnc: 3.9 g/dL (ref 2.9–4.4)
Albumin/Glob SerPl: 1.4 (ref 0.7–1.7)
Alpha 1: 0.2 g/dL (ref 0.0–0.4)
Alpha2 Glob SerPl Elph-Mcnc: 0.7 g/dL (ref 0.4–1.0)
B-Globulin SerPl Elph-Mcnc: 0.9 g/dL (ref 0.7–1.3)
Gamma Glob SerPl Elph-Mcnc: 1 g/dL (ref 0.4–1.8)
Globulin, Total: 2.8 g/dL (ref 2.2–3.9)
IgA: 153 mg/dL (ref 61–437)
IgG (Immunoglobin G), Serum: 1001 mg/dL (ref 603–1613)
IgM (Immunoglobulin M), Srm: 52 mg/dL (ref 20–172)
M Protein SerPl Elph-Mcnc: 0.4 g/dL — ABNORMAL HIGH
Total Protein ELP: 6.7 g/dL (ref 6.0–8.5)

## 2022-04-25 ENCOUNTER — Telehealth: Payer: Self-pay | Admitting: *Deleted

## 2022-04-25 NOTE — Telephone Encounter (Signed)
[name] patient regarding recent lab results.Spoke wit him and advised that his M Protein has crept up again this month to 0.4 It was 0.2 last month Pt states he saw this result on his [place].  Advised that we will see him the last week of July and re-check his myeloma panels again then. He voiced understanding. He states he is getting his mind right about starting another line of treatment soon.  Advise dthat Dr. [name] will review that with him again later in the month. Pt voiced understanding

## 2022-04-25 NOTE — Telephone Encounter (Signed)
-----   Message from Orson Slick, MD sent at 04/25/2022  3:50 PM EDT ----- Please let Mr. Fraleigh know that his M protein is up to 0.4. We will continue to monitor this closely.   ----- Message ----- From: Buel Ream, Lab In [place] Sent: 04/17/2022   3:01 PM EDT To: Orson Slick, MD

## 2022-04-28 ENCOUNTER — Encounter: Payer: Self-pay | Admitting: Hematology and Oncology

## 2022-04-28 NOTE — Progress Notes (Signed)
[place] [phone]   [phone]  PROGRESS NOTE  Patient Care Team: Antony Contras, MD as PCP - General (Family Medicine)  Hematological/Oncological History # IgG Kappa Multiple Myeloma 02/02/2021:  Presented to [facility] due to right sided flank tenderness with bruising. CT abdomen/pelvis: Multiple small lytic lesions in the thoracolumbar spine and bilateral pelvis -SPEP: IgG 2,082 (H), M Protein 1.8 (H). IFE shows IgG monoclonal protein with kappa light chain specificity.  -LDH 169, CBC normal, CMP normal except for sodium 131 (L), Chloride 96 (L).   02/14/2021: Establish care with Dede Query PA-C 02/22/2021: bone marrow biopsy confirms the diagnosis of Multiple Myeloma with a monoclonal plasma cell population.  03/16/2021: Cycle 1 Day 1 of VRd chemotherapy  04/06/2021: Cycle 2 Day 1 of VRd chemotherapy  04/27/2021: Cycle 3 Day 1 of VRd chemotherapy  05/18/2021: Cycle 4 Day 1 of VRd chemotherapy  06/01/2021: drop dexamethasone to 38m PO weekly and start lasix due to shortness of breath.  06/15/2021: Desaturation to 87% on ambulation. HELD velcade today and sent to ED for evaluation.  06/22/2021: Findings consistent with drug reaction the lungs with eosinophils on BAL.  Given these findings we will definitely hold [name] and plan to avoid pomalidomide 06/29/2021: Cycle 1 Day 1 CyBorD chemotherapy 07/05/2021: HOLD chemotherapy given worsening lung function  Interval History:  VAston [name] 66y.o. male with medical history significant for IgG Kappa multiple myeloma who presents for a follow up visit. The patient's last visit was on 01/31/2022.  In the interim since last visit he has continued  to see modest improvement in respiratory status.   On exam today Mr. Samuel [name] is accompanied by his wife.  He reports he still does not feel "100%" after his COVID infection.  He does still have some heaviness in his chest as well as congestion.  He is not having any  cough or mucus production.  He notes that he is being given a flutter valve in order to help with his lungs.  He reports that he does have occasions where he is "wiped out".  He notes that there have been some adjustments to his thyroid medication and he has been dieting to try to lose weight, cutting out carbs and bread and also eating organic.  He denies any fevers, chills, sweats, nausea, vomiting or diarrhea.  A full 10 point ROS is listed below.  We again had a discussion today about when it would be appropriate to start treatment.  I would recommend we continue to monitor the M protein and [facility]  My concern is that if his M protein increases to greater than 0.5 treatment would be necessary.  The patient voices understanding and agreement of this plan moving forward.  MEDICAL HISTORY:  Past Medical History:  Diagnosis Date   Asthma    GERD (gastroesophageal reflux disease)    High cholesterol    under control.    History of blood transfusion    Hypothyroidism    Interstitial lung disease (HCC)    Multiple myeloma (HCC)    Perennial allergic rhinitis    Pneumonia    Sciatic pain, right    Seasonal allergic rhinitis    Thyroid disease     SURGICAL HISTORY: Past Surgical History:  Procedure Laterality Date   BRONCHIAL BIOPSY  06/18/2021   Procedure: BRONCHIAL BIOPSIES;  Surgeon: BCollene Gobble MD;  Location: [facility];  Service: Cardiopulmonary;;   BRONCHIAL  BIOPSY  08/28/2021   Procedure: BRONCHIAL BIOPSIES;  Surgeon: Garner Nash, DO;  Location: [employer];  Service: Cardiopulmonary;;   BRONCHIAL WASHINGS  06/18/2021   Procedure: BRONCHIAL WASHINGS;  Surgeon: Collene Gobble, MD;  Location: [facility];  Service: Cardiopulmonary;;   BRONCHIAL WASHINGS  08/28/2021   Procedure: BRONCHIAL WASHINGS;  Surgeon: Garner Nash, DO;  Location: [place];  Service: Cardiopulmonary;;   NASAL SINUS SURGERY     NECK SURGERY   1996   VIDEO BRONCHOSCOPY N/A 06/18/2021   Procedure: VIDEO BRONCHOSCOPY WITH FLUORO;  Surgeon: Collene Gobble, MD;  Location: [facility];  Service: Cardiopulmonary;  Laterality: N/A;   VIDEO BRONCHOSCOPY N/A 08/28/2021   Procedure: VIDEO BRONCHOSCOPY WITH FLUORO;  Surgeon: Garner Nash, DO;  Location: [place];  Service: Cardiopulmonary;  Laterality: N/A;    SOCIAL HISTORY: Social History   Socioeconomic History   Marital status: Married    Spouse name: Not on file   Number of children: 3   Years of education: Not on file   Highest education level: Not on file  Occupational History   Not on file  Tobacco Use   Smoking status: Former    Packs/day: 0.10    Years: 3.00    Total pack years: 0.30    Types: Cigarettes    Quit date: 10/21/1982    Years since quitting: 39.5   Smokeless tobacco: Never  Vaping Use   Vaping Use: Never used  Substance and Sexual Activity   Alcohol use: Not Currently    Comment: 1 drink daily   Drug use: No   Sexual activity: Not on file  Other Topics Concern   Not on file  Social History Narrative   Not on file   Social Determinants of Health   Financial Resource Strain: Not on file  Food Insecurity: Not on file  Transportation Needs: Not on file  Physical Activity: Not on file  Stress: Not on file  Social Connections: Not on file  Intimate Partner Violence: Not on file    FAMILY HISTORY: Family History  Problem Relation Age of Onset   Hypertension Mother    Allergies Mother    Allergies Father    CAD Father 60   Breast cancer Paternal Grandmother    Hypertension Other    Heart disease Other     ALLERGIES:  is allergic to clarithromycin, ofev [nintedanib], and penicillins.  MEDICATIONS:  Current Outpatient Medications  Medication Sig Dispense Refill   acetaminophen (TYLENOL) 500 MG tablet Take 1,000 mg by mouth every 6 (six) hours as needed for mild pain, moderate pain or headache.     acyclovir (ZOVIRAX) 400 MG tablet  Take 1 tablet (400 mg total) by mouth 2 (two) times daily. 60 tablet 3   albuterol (PROVENTIL) (2.5 MG/3ML) 0.083% nebulizer solution Take 3 mLs (2.5 mg total) by nebulization every 6 (six) hours as needed for wheezing or shortness of breath. 120 mL 12   albuterol (VENTOLIN HFA) 108 (90 Base) MCG/ACT inhaler Inhale 2 puffs into the lungs every 6 hours as needed for wheezing or shortness of breath. 8.5 g 1   ALPRAZolam (XANAX) 0.5 MG tablet Take 0.5 mg by mouth at bedtime.     ASSESS FULL RANGE PEAK METER DEVI as directed.     bisoprolol (ZEBETA) 5 MG tablet Take 1 tablet (5 mg total) by mouth daily. 30 tablet 2   cholecalciferol (VITAMIN D) 1000 UNITS tablet Take 3,000 Units by mouth daily.  EPINEPHrine (EPI-PEN) 0.3 mg/0.3 mL DEVI Inject 0.3 mg into the muscle as needed.     ezetimibe-simvastatin (VYTORIN) 10-40 MG per tablet Take 1 tablet by mouth at bedtime.     fluticasone-salmeterol (ADVAIR HFA) 230-21 MCG/ACT inhaler Inhale 2 puffs into the lungs 2 (two) times daily. Brand name only 1 each 12   furosemide (LASIX) 20 MG tablet TAKE ONE TABLET BY MOUTH AS NEEDED FOR FLUID OR EDEMA 14 tablet 1   ketoconazole (NIZORAL) 2 % cream SMARTSIG:1 Application Topical 1 to 2 Times Daily     montelukast (SINGULAIR) 10 MG tablet Take 10 mg by mouth at bedtime.     Multiple Vitamin (MULTIVITAMIN) tablet Take 1 tablet by mouth daily.     omeprazole (PRILOSEC) 40 MG capsule Take 1 capsule by mouth daily.     OXYGEN Inhale 2 L into the lungs continuous.     predniSONE (DELTASONE) 5 MG tablet Once finish with 16m for 2 days, take 7.544m30 tablet 2   sildenafil (REVATIO) 20 MG tablet Take 20 mg by mouth daily as needed (ED).     Simethicone (SIMETHICONE ULTRA STRENGTH) 180 MG CAPS Take 1 capsule (180 mg total) by mouth 3 (three) times daily as needed. 90 capsule 0   sodium chloride (OCEAN) 0.65 % SOLN nasal spray Place 1 spray into both nostrils as needed for congestion.     tamsulosin (FLOMAX) 0.4 MG CAPS  capsule Take 0.4 mg by mouth daily.     thyroid (ARMOUR) 90 MG tablet Take 1 tablet by mouth daily.     triamcinolone cream (KENALOG) 0.1 % Apply 1 application topically daily as needed (sun burn itch).     No current facility-administered medications for this visit.    REVIEW OF SYSTEMS:   Constitutional: ( - ) fevers, ( - )  chills , ( - ) night sweats Eyes: ( - ) blurriness of vision, ( - ) double vision, ( - ) watery eyes Ears, nose, mouth, throat, and face: ( - ) mucositis, ( - ) sore throat Respiratory: ( - ) cough, ( - ) dyspnea, ( - ) wheezes Cardiovascular: ( - ) palpitation, ( - ) chest discomfort, ( - ) lower extremity swelling Gastrointestinal:  ( - ) nausea, ( - ) heartburn, ( - ) change in bowel habits Skin: ( - ) abnormal skin rashes Lymphatics: ( - ) new lymphadenopathy, ( - ) easy bruising Neurological: ( - ) numbness, ( - ) tingling, ( - ) new weaknesses Behavioral/Psych: ( - ) mood change, ( - ) new changes  All other systems were reviewed with the patient and are negative.  PHYSICAL EXAMINATION: ECOG PERFORMANCE STATUS: 0 - Asymptomatic  There were no vitals filed for this visit.    There were no vitals filed for this visit.   GENERAL: well appearing middle aged Caucasian male alert, no distress and comfortable SKIN: skin color, texture, turgor are normal, no rashes or significant lesions EYES: conjunctiva are pink and non-injected, sclera clear LUNGS: Decreased airflow with no crackles or rails.  No wheezing noted. HEART: regular rate & rhythm and no murmurs and no lower extremity edema Musculoskeletal: no cyanosis of digits and no clubbing  PSYCH: alert & oriented x 3, fluent speech NEURO: no focal motor/sensory deficits  LABORATORY DATA:  I have reviewed the data as listed    Latest Ref Rng & Units 04/17/2022    2:42 PM 01/31/2022   10:49 AM 01/06/2022    1:46 AM  CBC  WBC 4.0 - 10.5 K/uL 10.7  16.8  6.6   Hemoglobin 13.0 - 17.0 g/dL 13.8  13.5   12.8   Hematocrit 39.0 - 52.0 % 39.8  39.1  37.8   Platelets 150 - 400 K/uL 209  231  266        Latest Ref Rng & Units 04/17/2022    2:42 PM 01/31/2022   10:49 AM 01/06/2022    1:46 AM  CMP  Glucose 70 - 99 mg/dL 147  96  103   BUN 8 - 23 mg/dL 17  32  20   Creatinine 0.61 - 1.24 mg/dL 0.92  1.01  0.94   Sodium 135 - 145 mmol/L 132  131  133   Potassium 3.5 - 5.1 mmol/L 4.4  4.3  3.9   Chloride 98 - 111 mmol/L 99  98  98   CO2 22 - 32 mmol/L _0 Calcium 8.9 - 10.3 mg/dL 9.1  9.1  10.0   Total Protein 6.5 - 8.1 g/dL 6.9  7.1    Total Bilirubin 0.3 - 1.2 mg/dL 0.3  0.3    Alkaline Phos 38 - 126 U/L 34  33    AST 15 - 41 U/L 26  21    ALT 0 - 44 U/L 30  19      Lab Results  Component Value Date   MPROTEIN 0.4 (H) 04/17/2022   MPROTEIN 0.2 (H) 01/31/2022   MPROTEIN Not Observed 11/12/2021   Lab Results  Component Value Date   KPAFRELGTCHN 17.0 04/17/2022   KPAFRELGTCHN 16.5 01/31/2022   KPAFRELGTCHN 23.7 (H) 11/12/2021   LAMBDASER 7.2 04/17/2022   LAMBDASER 7.4 01/31/2022   LAMBDASER 13.6 11/12/2021   KAPLAMBRATIO 2.36 (H) 04/17/2022   KAPLAMBRATIO 2.23 (H) 01/31/2022   KAPLAMBRATIO 1.74 (H) 11/12/2021    RADIOGRAPHIC STUDIES: I have personally reviewed the radiological images as listed and agreed with the findings in the report: lytic lesions in the hip bones bilaterally.  No results found.   ASSESSMENT & PLAN Samuel [name] 66 y.o. male with medical history significant for IgG Kappa multiple myeloma who presents for a follow up visit.   After review of the labs, review of the records, and discussion with the patient the findings are most consistent with an IgG kappa multiple myeloma.  The patient has 2 lytic lesions within the pelvic bones (more noted in spine on CT scan) as well as 10% plasma cells within the bone marrow biopsy.  This combined with his serological findings confirm the diagnosis of multiple myeloma.  The initial treatment of choice for  this patient's multiple myeloma was VRd. This will consist of bortezomib 1.38m/m2 on days 1, 8, 15, dexamethasone 467mon days 1,8, and 15, and revlimid 2547mO daily days 1-14. Cycles will consist of 21 days. We will use this regimen to stabilize the patient's myeloma, then make a referral to a BMT center of his choosing for consideration of a bone marrow transplant once he has been noted to have a good response (VGPR or better). Zometa was started after dental clearance was obtained. This will be continued x 2 years.   Unfortunately had a drug reaction to [name] and was admitted to the hospital from 06/15/2021 until 06/18/2021.  The patient underwent a B AL which clearly showed evidence of eosinophils.  This is consistent with a drug reaction most likely caused by the patient's [name].  Discussed the case with  pulmonology who recommended that we not rechallenge for attempt other drugs in the same class such as pomalidomide.  Given the patient's excellent response to [facility] therapy might preference would be to continue that.  As such I would recommend we proceed with CyBorD chemotherapy.  Daratumumab-based therapy could have been considered, but is often paired with an immunologic.  Additionally I would like to preserve those for additional lines of therapy if necessary.  Therefore we will proceed with CyBorD chemotherapy have the patient referred to transplant.  R-IPSS score: Stage 2. PFS of 42 months  # IgG Kappa Multiple Myeloma (t11;14, standard risk)  --diagnostic criteria was met with monoclonal plasma cells in the bone marrow and lytic lesions on the bone --recommend proceeding with VRd chemotherapy as noted above --patient is young and healthy enough for consideration of BMT, though he is borderline with his age. Will consider referral pending response to treatment.  -- Cycle 1 Day 1 started on 03/16/2021 --decreased dexamethasone to 26m PO weekly due to fluid overload. Also started  lasix 233mPO (changed on 06/01/2021) --plan to HOLD revlimid moving forward after evidence of drug reaction in the lungs. --started CyBorD chemotherapy on 06/29/2021 --patient has reached a VGPR. [facility]  Plan: --will continue to HOLD chemotherapy at this time, allowing his respiratory status to recuperate and pulmonology to help optimize his breathing prior to rechallenging him --ILD is not improving on steroid therapy. Pulmonology performed bronchoscopy with biopsies. [facility] --At the risk of worsening his lung function will continue to hold therapy.  [facility]/[name]/dexamethasone as Therapy.  I do believe this would be reasonable with the patient was aware of the risks and benefits regarding possible worsening of the lung function. --RTC 8 weeks to readdress with interval 4-week labs.  #Drug Reaction in the Lung --confirmed with increased eosinophils on BAL during admission for shortness of breath --[facility]--will need to hold revlimid and and avoid the use of pomalidomide moving forward. Do no recommend rechallenge --patient will continue to follow up with pulmonology.   #Supportive Care -- chemotherapy education complete --zometa therapy started on 04/05/2021 (4 mg IV q 3 months), dose administered today.  --ASA 8161mO daily for thromboprophylaxis on revlimid -- zofran 8mg42mH PRN and compazine 10mg55mq6H for nausea -- acyclovir 400mg 30mID for VCZ prophylaxis -- no pain medication required at this time.   No orders of the defined types were placed in this encounter.  All questions were answered. The patient knows to call the clinic with any problems, questions or concerns.  A total of more than 30 minutes were spent on this encounter with face-to-face time and  non-face-to-face time, including preparing to see the patient, ordering tests and/or medications, counseling the patient and coordination of care as outlined above.   Ifeoluwa Bartz TLedell Peoplesepartment of Hematology/Oncology [facility]: [phone]: [phone]: [email or web address]  04/28/2022 7:18 PM

## 2022-05-03 ENCOUNTER — Telehealth: Payer: Self-pay | Admitting: Hematology and Oncology

## 2022-05-03 NOTE — Telephone Encounter (Signed)
.  Called patient to schedule appointment per 7/14 inbasket, patient is aware of date and time.

## 2022-05-13 ENCOUNTER — Other Ambulatory Visit: Payer: Self-pay

## 2022-05-14 ENCOUNTER — Encounter: Payer: Self-pay | Admitting: Family Medicine

## 2022-05-14 ENCOUNTER — Ambulatory Visit (INDEPENDENT_AMBULATORY_CARE_PROVIDER_SITE_OTHER): Payer: Medicare Other

## 2022-05-14 ENCOUNTER — Ambulatory Visit (INDEPENDENT_AMBULATORY_CARE_PROVIDER_SITE_OTHER): Payer: Medicare Other | Admitting: Family Medicine

## 2022-05-14 ENCOUNTER — Other Ambulatory Visit: Payer: Self-pay

## 2022-05-14 VITALS — BP 130/84 | HR 66 | Ht 70.0 in | Wt 213.8 lb

## 2022-05-14 DIAGNOSIS — M5416 Radiculopathy, lumbar region: Secondary | ICD-10-CM

## 2022-05-14 MED ORDER — LORAZEPAM 0.5 MG PO TABS: ORAL_TABLET | ORAL | 0 refills | Status: DC

## 2022-05-14 MED ORDER — GABAPENTIN 300 MG PO CAPS: 300.0000 mg | ORAL_CAPSULE | Freq: Three times a day (TID) | ORAL | 2 refills | Status: DC

## 2022-05-14 NOTE — Patient Instructions (Addendum)
Good to see you today.  Gabapentin '300mg'$  at bedtime.  Please get an Xray today before you leave.  I've ordered an L-spine MRI.  That facility will call you to schedule but please let us know if you don't hear from them in one week regarding scheduling.  Follow-up: after MRI

## 2022-05-14 NOTE — Progress Notes (Signed)
I, Wendy Poet, LAT, ATC, am serving as scribe for Dr. Lynne Leader.  Samuel [name] is a 66 y.o. male who presents to [place] [facility] today for re-occurring LBP. Pt was last seen by Dr. Georgina Snell on 11/23/20 L calf pain and lumbar radiculopathy thought to be due to spondylolisthesis at L5-S1 and was advised to cont treatment at [facility]. Today, pt reports low back pain returned that has worsened over the last 3-4 weeks . Pt locates pain to his R glute w/ radiating pain into the R lateral thigh and lower leg but skips the knee area.  He was diagnosed w/ multiple myeloma in 2022 and has completed his course of treatment.  He is finishing his prednisone treatment due to a lung issue that occurred as a result of chemo.  Radiating pain: yes along the lateral thigh and lateral calf LE numbness/tingling: no Aggravating factors: walking; sleeping; sitting Treatments tried: Advil; back aid max; heat; prednisone  Dx imaging: 05/11/21 L-spine & T-spine XR 11/23/21 Pelvis & L-spine XR  Pertinent review of systems: No fevers or chills  Relevant historical information: Multiple myeloma.  Interstitial lung disease.   Exam:  BP 130/84 (BP Location: Right Arm, Patient Position: Sitting, Cuff Size: Normal)   Pulse 66   Ht _0  (1.778 m)   Wt 213 lb 12.8 oz (97 kg)   SpO2 95%   BMI 30.68 kg/m  General: Well Developed, well nourished, and in no acute distress.   MSK: Lumbar spine: Nontender midline. Decreased lumbar motion. Lower extremity strength is intact. Reflexes are intact.     Lab and Radiology Results  X-ray images lumbar spine obtained today personally and independently interpreted. Degenerative changes present throughout lumbar spine.  No compression fractures present.  Known lytic lesions from prior lumbar spine x-rays difficult to visualize on this x-ray per my read. Await formal radiology review    Assessment and Plan: 66 y.o. male with  exacerbation of radiating pain down right leg in an L5 dermatomal pattern.  This occurs in the setting of somewhat progressive multiple myeloma.  At this point neck step should be lumbar spine MRI to further characterize spinal anatomy especially in the setting of cancer.  Anticipate epidural steroid injection based on MRI.  Trial of gabapentin as well.  Of note he has significant claustrophobia so we will use lorazepam with MRI.   PDMP reviewed during this encounter. Orders Placed This Encounter  Procedures   MR Lumbar Spine Wo Contrast    Standing Status:   Future    Standing Expiration Date:   05/15/2023    Order Specific Question:   What is the patient's sedation requirement?    Answer:   No Sedation    Order Specific Question:   Does the patient have a pacemaker or implanted devices?    Answer:   No    Order Specific Question:   Preferred imaging location?    Answer:   Product/process development scientist (table limit-350lbs)   DG Lumbar Spine 2-3 Views    Standing Status:   Future    Number of Occurrences:   1    Standing Expiration Date:   06/14/2022    Order Specific Question:   Reason for Exam (SYMPTOM  OR DIAGNOSIS REQUIRED)    Answer:   low back pain    Order Specific Question:   Preferred imaging location?    Answer:   [facility]   Meds ordered this encounter  Medications   LORazepam (ATIVAN) 0.5 MG tablet    Sig: 1-2 tabs 30 - 60 min prior to MRI. Do not drive with this medicine.    Dispense:  4 tablet    Refill:  0   gabapentin (NEURONTIN) 300 MG capsule    Sig: Take 1 capsule (300 mg total) by mouth 3 (three) times daily.    Dispense:  90 capsule    Refill:  2     Discussed warning signs or symptoms. Please see discharge instructions. Patient expresses understanding.   The above documentation has been reviewed and is accurate and complete Lynne Leader, M.D.

## 2022-05-16 ENCOUNTER — Other Ambulatory Visit (HOSPITAL_COMMUNITY): Payer: Self-pay

## 2022-05-16 ENCOUNTER — Telehealth: Payer: Self-pay

## 2022-05-16 ENCOUNTER — Telehealth: Payer: Self-pay | Admitting: Internal Medicine

## 2022-05-16 DIAGNOSIS — J849 Interstitial pulmonary disease, unspecified: Secondary | ICD-10-CM

## 2022-05-16 NOTE — Telephone Encounter (Signed)
Samuel [name]/Samuel [name].  He wants to start on pirfenidone.  This for progressive phenotype.  He has failed nintedanib.  Please do the needful  Thanks    SIGNATURE    Dr. Brand Males, M.D., F.C.C.P,  Pulmonary and Critical Care Medicine Staff Physician, [place] Director - Interstitial Lung Disease  Program  Medical Director - [place] ICU Pulmonary [place] at [place], [place], [address]  NPI Number:  NPI #[id number] [facility] Number: [id number]  Pager: [phone], If no answer  -[place] or [place] [phone] Telephone (clinical office): [phone] Telephone (research): [phone]  9:43 AM 05/16/2022

## 2022-05-16 NOTE — Telephone Encounter (Signed)
Received request from MR to initiate BIV for Esbriet as pt has failed Ofev.  Submitted a Prior Authorization request to [facility] for PIRFENIDONE via CoverMyMeds. Request was cancelled due to member already having access to medication.  Key: [id number]   Per 12/10/21 encounter, medication previously approved from 10/30/21 to 12/29/22.  Test claim reveals that initial titration dose (#207 tabs for 30 days) would be $165.69, maintenance dose (#270 tabs) would be $216.12 per month, which is the same price pt was initially quoted. Maintenance dose for #90 '801mg'$  tablets is $215.90.  Pt can fill with [facility]

## 2022-05-16 NOTE — Telephone Encounter (Signed)
Noted, will begin BIV for generic pirfenidone in separate encounter.

## 2022-05-16 NOTE — Progress Notes (Signed)
Lumbar spine x-ray shows some mild arthritis changes.  No compression fractures.

## 2022-05-17 ENCOUNTER — Inpatient Hospital Stay: Payer: Medicare Other | Attending: Hematology and Oncology | Admitting: Hematology and Oncology

## 2022-05-17 ENCOUNTER — Inpatient Hospital Stay: Payer: Medicare Other

## 2022-05-17 ENCOUNTER — Other Ambulatory Visit: Payer: Self-pay

## 2022-05-17 ENCOUNTER — Other Ambulatory Visit: Payer: Self-pay | Admitting: Hematology and Oncology

## 2022-05-17 VITALS — BP 140/85 | HR 69 | Temp 98.1°F | Resp 15 | Wt 213.1 lb

## 2022-05-17 DIAGNOSIS — Z87891 Personal history of nicotine dependence: Secondary | ICD-10-CM | POA: Insufficient documentation

## 2022-05-17 DIAGNOSIS — Z7952 Long term (current) use of systemic steroids: Secondary | ICD-10-CM | POA: Insufficient documentation

## 2022-05-17 DIAGNOSIS — Z7951 Long term (current) use of inhaled steroids: Secondary | ICD-10-CM | POA: Insufficient documentation

## 2022-05-17 DIAGNOSIS — Z79899 Other long term (current) drug therapy: Secondary | ICD-10-CM | POA: Diagnosis not present

## 2022-05-17 DIAGNOSIS — Z79624 Long term (current) use of inhibitors of nucleotide synthesis: Secondary | ICD-10-CM | POA: Insufficient documentation

## 2022-05-17 DIAGNOSIS — C9000 Multiple myeloma not having achieved remission: Secondary | ICD-10-CM | POA: Insufficient documentation

## 2022-05-17 DIAGNOSIS — R0602 Shortness of breath: Secondary | ICD-10-CM | POA: Diagnosis not present

## 2022-05-17 LAB — CBC WITH DIFFERENTIAL (CANCER CENTER ONLY)
Abs Immature Granulocytes: 0.02 10*3/uL (ref 0.00–0.07)
Basophils Absolute: 0 10*3/uL (ref 0.0–0.1)
Basophils Relative: 0 %
Eosinophils Absolute: 0 10*3/uL (ref 0.0–0.5)
Eosinophils Relative: 0 %
HCT: 38 % — ABNORMAL LOW (ref 39.0–52.0)
Hemoglobin: 13.2 g/dL (ref 13.0–17.0)
Immature Granulocytes: 0 %
Lymphocytes Relative: 15 %
Lymphs Abs: 1.1 10*3/uL (ref 0.7–4.0)
MCH: 31.6 pg (ref 26.0–34.0)
MCHC: 34.7 g/dL (ref 30.0–36.0)
MCV: 90.9 fL (ref 80.0–100.0)
Monocytes Absolute: 0.7 10*3/uL (ref 0.1–1.0)
Monocytes Relative: 9 %
Neutro Abs: 5.7 10*3/uL (ref 1.7–7.7)
Neutrophils Relative %: 76 %
Platelet Count: 240 10*3/uL (ref 150–400)
RBC: 4.18 MIL/uL — ABNORMAL LOW (ref 4.22–5.81)
RDW: 13 % (ref 11.5–15.5)
WBC Count: 7.5 10*3/uL (ref 4.0–10.5)
nRBC: 0 % (ref 0.0–0.2)

## 2022-05-17 LAB — CMP (CANCER CENTER ONLY)
ALT: 20 U/L (ref 0–44)
AST: 23 U/L (ref 15–41)
Albumin: 4.2 g/dL (ref 3.5–5.0)
Alkaline Phosphatase: 36 U/L — ABNORMAL LOW (ref 38–126)
Anion gap: 5 (ref 5–15)
BUN: 24 mg/dL — ABNORMAL HIGH (ref 8–23)
CO2: 27 mmol/L (ref 22–32)
Calcium: 8.9 mg/dL (ref 8.9–10.3)
Chloride: 97 mmol/L — ABNORMAL LOW (ref 98–111)
Creatinine: 0.83 mg/dL (ref 0.61–1.24)
GFR, Estimated: >60 mL/min (ref >60)
Glucose, Bld: 101 mg/dL — ABNORMAL HIGH (ref 70–99)
Potassium: 4.7 mmol/L (ref 3.5–5.1)
Sodium: 129 mmol/L — ABNORMAL LOW (ref 135–145)
Total Bilirubin: 0.4 mg/dL (ref 0.3–1.2)
Total Protein: 7.3 g/dL (ref 6.5–8.1)

## 2022-05-17 LAB — LACTATE DEHYDROGENASE: LDH: 241 U/L — ABNORMAL HIGH (ref 98–192)

## 2022-05-18 ENCOUNTER — Ambulatory Visit (INDEPENDENT_AMBULATORY_CARE_PROVIDER_SITE_OTHER): Payer: Medicare Other

## 2022-05-18 DIAGNOSIS — M5416 Radiculopathy, lumbar region: Secondary | ICD-10-CM | POA: Diagnosis not present

## 2022-05-20 LAB — KAPPA/LAMBDA LIGHT CHAINS
Kappa free light chain: 27.9 mg/L — ABNORMAL HIGH (ref 3.3–19.4)
Kappa, lambda light chain ratio: 2.88 — ABNORMAL HIGH (ref 0.26–1.65)
Lambda free light chains: 9.7 mg/L (ref 5.7–26.3)

## 2022-05-21 ENCOUNTER — Telehealth: Payer: Self-pay | Admitting: Family Medicine

## 2022-05-21 ENCOUNTER — Other Ambulatory Visit: Payer: Self-pay

## 2022-05-21 ENCOUNTER — Other Ambulatory Visit: Payer: Self-pay | Admitting: Hematology and Oncology

## 2022-05-21 ENCOUNTER — Other Ambulatory Visit (HOSPITAL_COMMUNITY): Payer: Self-pay

## 2022-05-21 DIAGNOSIS — M5416 Radiculopathy, lumbar region: Secondary | ICD-10-CM

## 2022-05-21 LAB — MULTIPLE MYELOMA PANEL, SERUM
Albumin SerPl Elph-Mcnc: 3.6 g/dL (ref 2.9–4.4)
Albumin/Glob SerPl: 1.3 (ref 0.7–1.7)
Alpha 1: 0.2 g/dL (ref 0.0–0.4)
Alpha2 Glob SerPl Elph-Mcnc: 0.8 g/dL (ref 0.4–1.0)
B-Globulin SerPl Elph-Mcnc: 0.9 g/dL (ref 0.7–1.3)
Gamma Glob SerPl Elph-Mcnc: 1.1 g/dL (ref 0.4–1.8)
Globulin, Total: 3 g/dL (ref 2.2–3.9)
IgA: 149 mg/dL (ref 61–437)
IgG (Immunoglobin G), Serum: 1117 mg/dL (ref 603–1613)
IgM (Immunoglobulin M), Srm: 58 mg/dL (ref 20–172)
M Protein SerPl Elph-Mcnc: 0.5 g/dL — ABNORMAL HIGH
Total Protein ELP: 6.6 g/dL (ref 6.0–8.5)

## 2022-05-21 MED ORDER — PIRFENIDONE 267 MG PO TABS
ORAL_TABLET | ORAL | 0 refills | Status: DC
  Filled 2022-05-21: qty 207, fill #0
  Filled 2022-05-22: qty 207, 30d supply, fill #0

## 2022-05-21 MED ORDER — PIRFENIDONE 267 MG PO TABS
801.0000 mg | ORAL_TABLET | Freq: Three times a day (TID) | ORAL | 4 refills | Status: DC
  Filled 2022-05-21 – 2022-06-12 (×2): qty 270, 30d supply, fill #0

## 2022-05-21 NOTE — Telephone Encounter (Signed)
I spoke with patient regarding pricing of pirfenidone. He states he'd like to move forward with paying for it. I did review that with new calendar year (January 2024), the price will significantly increase  due to deductible, coverag gap, etc. He verbalized understanding  Patient counseled on purpose, proper use, and potential adverse effects including nausea, dizziness, and suns sensitivity/rash.  Stressed the importance of routine lab monitoring. Will monitor LFT's every month for the first 6 months of treatment then every 3 months. Will monitor CBC every 3 months. Patient already has monthly labs completed by oncologist.  Starting dose will be Esbriet 267 mg 1 tablet three times daily for 7 days, then 2 tablets three times daily for 7 days, then 3 tablets three times daily.  Maintenance dose will be 801 mg 1 tablet three times daily if tolerated.  Stressed the importance of taking with meals to minimize stomach upset.    No drug interactions noted between pirfenidone and patient's current medication list  Rx for titration dose and maintenance dose [facility]. Patient advised that [employer] [employer] will reach out to collect any payment information needed and to set up first shipment to his home.   He verbalized understanding to all of the above.  Knox Saliva, PharmD, MPH, BCPS, CPP Clinical Pharmacist (Rheumatology and Pulmonology)

## 2022-05-21 NOTE — Progress Notes (Signed)
MRI shows several locations where nerves could be pinched causing pain down your leg.  Your symptoms are typical for right L5 nerve root which looks like it is being pinched.  We will treat With an epidural steroid injection. Additionally you have a bulging disc causing some spinal stenosis at L3-4 and L4-5.  This does not seem to be causing his mood symptoms for you but it certainly could.  Plan for epidural steroid injection.  Recommend recheck in a few weeks.  Please call [facility] imaging at [phone] to schedule.

## 2022-05-21 NOTE — Telephone Encounter (Signed)
Epidural steroid injection ordered 

## 2022-05-22 ENCOUNTER — Other Ambulatory Visit (HOSPITAL_COMMUNITY): Payer: Self-pay

## 2022-05-22 ENCOUNTER — Ambulatory Visit
Admission: RE | Admit: 2022-05-22 | Discharge: 2022-05-22 | Disposition: A | Discharge status: Home / Self Care | Payer: Medicare Other | Source: Ambulatory Visit | Attending: Family Medicine | Admitting: Family Medicine

## 2022-05-22 DIAGNOSIS — M5416 Radiculopathy, lumbar region: Secondary | ICD-10-CM

## 2022-05-22 MED ORDER — IOPAMIDOL (ISOVUE-M 200) INJECTION 41%
1.0000 mL | Freq: Once | INTRAMUSCULAR | Status: AC
  Administered 2022-05-22: 1 mL via EPIDURAL

## 2022-05-22 MED ORDER — METHYLPREDNISOLONE ACETATE 40 MG/ML INJ SUSP (RADIOLOG
80.0000 mg | Freq: Once | INTRAMUSCULAR | Status: AC
  Administered 2022-05-22: 80 mg via EPIDURAL

## 2022-05-22 NOTE — Telephone Encounter (Signed)
Reached out to pt's wife to collect CC info, LVM requesting a return call. Will await f/u.

## 2022-05-22 NOTE — Discharge Instructions (Signed)

## 2022-05-23 ENCOUNTER — Other Ambulatory Visit (HOSPITAL_COMMUNITY): Payer: Self-pay

## 2022-05-23 ENCOUNTER — Telehealth: Payer: Self-pay | Admitting: *Deleted

## 2022-05-23 NOTE — Telephone Encounter (Signed)
Delivery instructions have been updated in [place], medication will be shipped to patient's home address by 05/24/22.  Rx has been processed in [facility] and there is a copay of $165.69. Payment information has been collected and forwarded to the pharmacy.

## 2022-05-23 NOTE — Telephone Encounter (Signed)
Received vm message from pt regarding upcoming appts. Reviewed his appts and corrections/adjustments have been made.  [name] patient to review his new schedule with him. He is in agreement with new schedule. His next visit here will be on 06/25/22 for labs, MD visit and Zometa

## 2022-05-25 ENCOUNTER — Encounter: Payer: Self-pay | Admitting: Hematology and Oncology

## 2022-05-25 NOTE — Progress Notes (Signed)
[place] [phone]   [phone]  PROGRESS NOTE  Patient Care Team: Antony Contras, MD as PCP - General (Family Medicine)  Hematological/Oncological History # IgG Kappa Multiple Myeloma 02/02/2021:  Presented to [place] ED due to right sided flank tenderness with bruising. CT abdomen/pelvis: Multiple small lytic lesions in the thoracolumbar spine and bilateral pelvis -SPEP: IgG 2,082 (H), M Protein 1.8 (H). IFE shows IgG monoclonal protein with kappa light chain specificity.  -LDH 169, CBC normal, CMP normal except for sodium 131 (L), Chloride 96 (L).   02/14/2021: Establish care with Dede Query PA-C 02/22/2021: bone marrow biopsy confirms the diagnosis of Multiple Myeloma with a monoclonal plasma cell population.  03/16/2021: Cycle 1 Day 1 of VRd chemotherapy  04/06/2021: Cycle 2 Day 1 of VRd chemotherapy  04/27/2021: Cycle 3 Day 1 of VRd chemotherapy  05/18/2021: Cycle 4 Day 1 of VRd chemotherapy  06/01/2021: drop dexamethasone to 71m PO weekly and start lasix due to shortness of breath.  06/15/2021: Desaturation to 87% on ambulation. HELD velcade today and sent to ED for evaluation.  06/22/2021: Findings consistent with drug reaction the lungs with eosinophils on BAL.  Given these findings we will definitely hold [name] and plan to avoid pomalidomide 06/29/2021: Cycle 1 Day 1 CyBorD chemotherapy 07/05/2021: HOLD chemotherapy given worsening lung function  Interval History:  Samuel [name] 66y.o. male with medical history significant for IgG Kappa multiple myeloma who presents for a follow up visit. The patient's last visit was on 04/17/2022.  In the interim since last visit he has continued to struggle with shortness of breath.  On exam today Samuel [name] is accompanied by his wife.  He reports he is not bouncing back particularly well.  He notes his sciatica is making it difficult for him to walk and he is not spending as much time on the treadmill.  The  treadmill was giving him relief from his shortness of breath.  He reports that he is "not feeling good at all".  He notes that he is also been started on new ILD treatment which is supposed to "stop progression but not reverse" the lung disease.  He denies any fevers, chills, sweats, nausea, vomiting or diarrhea.  A full 10 point ROS is listed below.  Previously we had a discussion today about when it would be appropriate to start treatment.  I would recommend we continue to monitor the M protein and continued [facility]  My concern is that if his M protein increases to greater than 0.5 treatment would be necessary.  The patient voices understanding and agreement of this plan moving forward.  MEDICAL HISTORY:  Past Medical History:  Diagnosis Date   Asthma    GERD (gastroesophageal reflux disease)    High cholesterol    under control.    History of blood transfusion    Hypothyroidism    Interstitial lung disease (HCC)    Multiple myeloma (HCC)    Perennial allergic rhinitis    Pneumonia    Sciatic pain, right    Seasonal allergic rhinitis    Thyroid disease     SURGICAL HISTORY: Past Surgical History:  Procedure Laterality Date   BRONCHIAL BIOPSY  06/18/2021   Procedure: BRONCHIAL BIOPSIES;  Surgeon: BCollene Gobble MD;  Location: [facility];  Service: Cardiopulmonary;;   BRONCHIAL BIOPSY  08/28/2021   Procedure: BRONCHIAL BIOPSIES;  Surgeon: IGarner Nash DO;  [place] [place]  Service: Cardiopulmonary;;  BRONCHIAL WASHINGS  06/18/2021   Procedure: BRONCHIAL WASHINGS;  Surgeon: Collene Gobble, MD;  Location: [facility];  Service: Cardiopulmonary;;   BRONCHIAL WASHINGS  08/28/2021   Procedure: BRONCHIAL WASHINGS;  Surgeon: Garner Nash, DO;  Location: [place];  Service: Cardiopulmonary;;   NASAL SINUS SURGERY     NECK SURGERY  1996   VIDEO BRONCHOSCOPY N/A 06/18/2021   Procedure: VIDEO BRONCHOSCOPY WITH FLUORO;  Surgeon:  Collene Gobble, MD;  Location: [facility];  Service: Cardiopulmonary;  Laterality: N/A;   VIDEO BRONCHOSCOPY N/A 08/28/2021   Procedure: VIDEO BRONCHOSCOPY WITH FLUORO;  Surgeon: Garner Nash, DO;  Location: [place];  Service: Cardiopulmonary;  Laterality: N/A;    SOCIAL HISTORY: Social History   Socioeconomic History   Marital status: Married    Spouse name: Not on file   Number of children: 3   Years of education: Not on file   Highest education level: Not on file  Occupational History   Not on file  Tobacco Use   Smoking status: Former    Packs/day: 0.10    Years: 3.00    Total pack years: 0.30    Types: Cigarettes    Quit date: 10/21/1982    Years since quitting: 39.6   Smokeless tobacco: Never  Vaping Use   Vaping Use: Never used  Substance and Sexual Activity   Alcohol use: Not Currently    Comment: 1 drink daily   Drug use: No   Sexual activity: Not on file  Other Topics Concern   Not on file  Social History Narrative   Not on file   Social Determinants of Health   Financial Resource Strain: Not on file  Food Insecurity: Not on file  Transportation Needs: Not on file  Physical Activity: Not on file  Stress: Not on file  Social Connections: Not on file  Intimate Partner Violence: Not on file    FAMILY HISTORY: Family History  Problem Relation Age of Onset   Hypertension Mother    Allergies Mother    Allergies Father    CAD Father 45   Breast cancer Paternal Grandmother    Hypertension Other    Heart disease Other     ALLERGIES:  is allergic to clarithromycin, ofev [nintedanib], and penicillins.  MEDICATIONS:  Current Outpatient Medications  Medication Sig Dispense Refill   acetaminophen (TYLENOL) 500 MG tablet Take 1,000 mg by mouth every 6 (six) hours as needed for mild pain, moderate pain or headache.     acyclovir (ZOVIRAX) 400 MG tablet Take 1 tablet (400 mg total) by mouth 2 (two) times daily. 60 tablet 3   albuterol (PROVENTIL)  (2.5 MG/3ML) 0.083% nebulizer solution Take 3 mLs (2.5 mg total) by nebulization every 6 (six) hours as needed for wheezing or shortness of breath. 120 mL 12   albuterol (VENTOLIN HFA) 108 (90 Base) MCG/ACT inhaler Inhale 2 puffs into the lungs every 6 hours as needed for wheezing or shortness of breath. 8.5 g 1   ALPRAZolam (XANAX) 0.5 MG tablet Take 0.5 mg by mouth at bedtime.     ASSESS FULL RANGE PEAK METER DEVI as directed.     bisoprolol (ZEBETA) 5 MG tablet Take 1 tablet (5 mg total) by mouth daily. 30 tablet 2   cholecalciferol (VITAMIN D) 1000 UNITS tablet Take 3,000 Units by mouth daily.     EPINEPHrine (EPI-PEN) 0.3 mg/0.3 mL DEVI Inject 0.3 mg into the muscle as needed.     ezetimibe-simvastatin (VYTORIN) 10-40  MG per tablet Take 1 tablet by mouth at bedtime.     fluticasone-salmeterol (ADVAIR HFA) 230-21 MCG/ACT inhaler Inhale 2 puffs into the lungs 2 (two) times daily. Brand name only 1 each 12   furosemide (LASIX) 20 MG tablet TAKE ONE TABLET BY MOUTH AS NEEDED FOR FLUID OR EDEMA 14 tablet 1   gabapentin (NEURONTIN) 300 MG capsule Take 1 capsule (300 mg total) by mouth 3 (three) times daily. 90 capsule 2   ketoconazole (NIZORAL) 2 % cream SMARTSIG:1 Application Topical 1 to 2 Times Daily     LORazepam (ATIVAN) 0.5 MG tablet 1-2 tabs 30 - 60 min prior to MRI. Do not drive with this medicine. 4 tablet 0   montelukast (SINGULAIR) 10 MG tablet Take 10 mg by mouth at bedtime.     Multiple Vitamin (MULTIVITAMIN) tablet Take 1 tablet by mouth daily.     omeprazole (PRILOSEC) 40 MG capsule Take 1 capsule by mouth daily.     OXYGEN Inhale 2 L into the lungs continuous.     Pirfenidone (ESBRIET) 267 MG TABS Month 1: Take 1 tab three times daily for 7 days, then 2 tabs three times daily for 7 days, then 3 tabs three times daily thereafter. 207 tablet 0   Pirfenidone 267 MG TABS Take 3 tablets (801 mg total) by mouth 3 (three) times daily with meals. 270 tablet 4   predniSONE (DELTASONE) 5 MG  tablet Once finish with 76m for 2 days, take 7.511m30 tablet 2   sildenafil (REVATIO) 20 MG tablet Take 20 mg by mouth daily as needed (ED).     Simethicone (SIMETHICONE ULTRA STRENGTH) 180 MG CAPS Take 1 capsule (180 mg total) by mouth 3 (three) times daily as needed. 90 capsule 0   sodium chloride (OCEAN) 0.65 % SOLN nasal spray Place 1 spray into both nostrils as needed for congestion.     tamsulosin (FLOMAX) 0.4 MG CAPS capsule Take 0.4 mg by mouth daily.     thyroid (ARMOUR) 90 MG tablet Take 1 tablet by mouth daily.     triamcinolone cream (KENALOG) 0.1 % Apply 1 application topically daily as needed (sun burn itch).     No current facility-administered medications for this visit.    REVIEW OF SYSTEMS:   Constitutional: ( - ) fevers, ( - )  chills , ( - ) night sweats Eyes: ( - ) blurriness of vision, ( - ) double vision, ( - ) watery eyes Ears, nose, mouth, throat, and face: ( - ) mucositis, ( - ) sore throat Respiratory: ( - ) cough, ( - ) dyspnea, ( - ) wheezes Cardiovascular: ( - ) palpitation, ( - ) chest discomfort, ( - ) lower extremity swelling Gastrointestinal:  ( - ) nausea, ( - ) heartburn, ( - ) change in bowel habits Skin: ( - ) abnormal skin rashes Lymphatics: ( - ) new lymphadenopathy, ( - ) easy bruising Neurological: ( - ) numbness, ( - ) tingling, ( - ) new weaknesses Behavioral/Psych: ( - ) mood change, ( - ) new changes  All other systems were reviewed with the patient and are negative.  PHYSICAL EXAMINATION: ECOG PERFORMANCE STATUS: 0 - Asymptomatic  Vitals:   05/17/22 1501  BP: 140/85  Pulse: 69  Resp: 15  Temp: 98.1 F (36.7 C)  SpO2: 97%      Filed Weights   05/17/22 1501  Weight: 213 lb 1.6 oz (96.7 kg)     GENERAL: well appearing middle  aged Caucasian male alert, no distress and comfortable SKIN: skin color, texture, turgor are normal, no rashes or significant lesions EYES: conjunctiva are pink and non-injected, sclera clear LUNGS:  Decreased airflow with no crackles or rails.  No wheezing noted. HEART: regular rate & rhythm and no murmurs and no lower extremity edema Musculoskeletal: no cyanosis of digits and no clubbing  PSYCH: alert & oriented x 3, fluent speech NEURO: no focal motor/sensory deficits  LABORATORY DATA:  I have reviewed the data as listed    Latest Ref Rng & Units 05/17/2022    2:43 PM 04/17/2022    2:42 PM 01/31/2022   10:49 AM  CBC  WBC 4.0 - 10.5 K/uL 7.5  10.7  16.8   Hemoglobin 13.0 - 17.0 g/dL 13.2  13.8  13.5   Hematocrit 39.0 - 52.0 % 38.0  39.8  39.1   Platelets 150 - 400 K/uL 240  209  231        Latest Ref Rng & Units 05/17/2022    2:43 PM 04/17/2022    2:42 PM 01/31/2022   10:49 AM  CMP  Glucose 70 - 99 mg/dL 101  147  96   BUN 8 - 23 mg/dL 24  17  32   Creatinine 0.61 - 1.24 mg/dL 0.83  0.92  1.01   Sodium 135 - 145 mmol/L 129  132  131   Potassium 3.5 - 5.1 mmol/L 4.7  4.4  4.3   Chloride 98 - 111 mmol/L 97  99  98   CO2 22 - 32 mmol/L _0 Calcium 8.9 - 10.3 mg/dL 8.9  9.1  9.1   Total Protein 6.5 - 8.1 g/dL 7.3  6.9  7.1   Total Bilirubin 0.3 - 1.2 mg/dL 0.4  0.3  0.3   Alkaline Phos 38 - 126 U/L 36  34  33   AST 15 - 41 U/L _1 ALT 0 - 44 U/L _2 Lab Results  Component Value Date   MPROTEIN 0.5 (H) 05/17/2022   MPROTEIN 0.4 (H) 04/17/2022   MPROTEIN 0.2 (H) 01/31/2022   Lab Results  Component Value Date   KPAFRELGTCHN 27.9 (H) 05/17/2022   KPAFRELGTCHN 17.0 04/17/2022   KPAFRELGTCHN 16.5 01/31/2022   LAMBDASER 9.7 05/17/2022   LAMBDASER 7.2 04/17/2022   LAMBDASER 7.4 01/31/2022   KAPLAMBRATIO 2.88 (H) 05/17/2022   KAPLAMBRATIO 2.36 (H) 04/17/2022   KAPLAMBRATIO 2.23 (H) 01/31/2022    RADIOGRAPHIC STUDIES: I have personally reviewed the radiological images as listed and agreed with the findings in the report: lytic lesions in the hip bones bilaterally.  DG INJECT DIAG/THERA/INC NEEDLE/CATH/PLC EPI/LUMB/SAC W/IMG  Result Date:  05/22/2022 CLINICAL DATA:  Back pain and right radicular pain. Right L5 symptoms. Multifactorial spinal stenosis L3-4. Right foraminal stenosis L5-S1. FLUOROSCOPY: Radiation Exposure Index (as provided by the fluoroscopic device): 39.2 micro gray meter squared. 0 minutes 28 seconds. PROCEDURE: The procedure, risks, benefits, and alternatives were explained to the patient. Questions regarding the procedure were encouraged and answered. The patient understands and consents to the procedure. LUMBAR EPIDURAL INJECTION: An interlaminar approach was performed on right at L4-5. The overlying skin was cleansed and anesthetized. A 20 gauge epidural needle was advanced using loss-of-resistance technique. DIAGNOSTIC EPIDURAL INJECTION: Injection of Isovue-M 200 shows a good epidural pattern with spread above and below the level of needle placement, primarily on the right. No vascular  opacification is seen. THERAPEUTIC EPIDURAL INJECTION: Eighty mg of Depo-Medrol mixed with 2.5 cc 1% lidocaine were instilled. The procedure was well-tolerated, and the patient was discharged thirty minutes following the injection in good condition. COMPLICATIONS: None IMPRESSION: Technically successful epidural injection on the right at L4-5 # 1. This location was chosen for the potential to treat both the L3-4 stenosis and the L5-S1 disease. Depending on how he responds to this, we might consider a specific right L5 nerve root block if radicular symptoms do not respond satisfactorily. Electronically Signed   By: Nelson Chimes M.D.   On: 05/22/2022 10:59   MR Lumbar Spine Wo Contrast  Result Date: 05/20/2022 CLINICAL DATA:  Lumbar radiculopathy.  Pain for 3 years. EXAM: MRI LUMBAR SPINE WITHOUT CONTRAST TECHNIQUE: Multiplanar, multisequence MR imaging of the lumbar spine was performed. No intravenous contrast was administered. COMPARISON:  Lumbar spine radiographs 05/14/2022, CT abdomen/pelvis 02/02/2021 FINDINGS: Segmentation: Standard; the  lowest formed disc space is designated L5-S1. Alignment: There is mild levocurvature. There is grade 1 retrolisthesis of L2 on L3 through L4 on L5 and grade 1 anterolisthesis of L5 on S1 with associated bilateral pars defects. Vertebrae: Vertebral body heights are preserved. Background marrow signal is normal. There is no suspicious marrow signal abnormality or marrow edema. There are scattered small Schmorl's nodes without associated edema. Scattered T1 hyperintense lesions most notably in the L5 vertebral body likely reflect intraosseous hemangiomas. Conus medullaris and cauda equina: Conus extends to the L1 level. Conus and cauda equina appear normal. Paraspinal and other soft tissues: Unremarkable. Disc levels: There is marked disc desiccation and narrowing at L5-S1. There is mild-to-moderate disc degeneration at the other levels otherwise most notable at L3, eccentric to the left due to the scoliotic curvature. T12-L1: There is a shallow central protrusion without significant spinal canal or neural foraminal stenosis L1-L2: There is disc desiccation and narrowing with a shallow central protrusion and minimal facet arthropathy without significant spinal canal or neural foraminal stenosis L2-L3: There is disc desiccation and narrowing with a mild disc bulge and mild facet arthropathy resulting in mild narrowing of the left subarticular zone without evidence of nerve root impingement and no significant neural foraminal stenosis L3-L4: There is moderate disc desiccation and narrowing with a diffuse disc bulge eccentric to the left and moderate bilateral facet arthropathy resulting in severe spinal canal stenosis with compression of the cauda equina nerve roots and effacement of the subarticular zones and mild-to-moderate left and no significant right neural foraminal stenosis L4-L5: There is a diffuse disc bulge with a small inferiorly migrated central extrusion and mild bilateral facet arthropathy resulting in  mild narrowing of the right subarticular zone without evidence of frank impingement and mild-to-moderate left and no significant right neural foraminal stenosis L5-S1: There is grade 1 anterolisthesis with uncovering of the disc posteriorly and associated bilateral pars defects with moderate right worse than left facet arthropathy resulting in severe right neural foraminal stenosis with impingement of the exiting L5 nerve root and mild left neural foraminal stenosis. No significant spinal canal stenosis. IMPRESSION: 1. Grade 1 anterolisthesis of L5 on S1 with associated bilateral pars defects and moderate right worse than left facet arthropathy resulting in severe right neural foraminal stenosis with impingement of the exiting L5 nerve root. 2. Disc degeneration with a diffuse bulge and bilateral facet arthropathy at L3-L4 resulting in severe spinal canal stenosis with compression of the cauda equina nerve roots and mild-to-moderate left neural foraminal stenosis. The disc degeneration is eccentric to  the left at this level due to the mild scoliotic curvature 3. Diffuse disc bulge at L4-L5 with a small inferiorly migrated extrusion. Mild-to-moderate left neural foraminal stenosis at this level with no significant spinal canal stenosis. Electronically Signed   By: Valetta Mole M.D.   On: 05/20/2022 13:49   DG Lumbar Spine 2-3 Views  Result Date: 05/15/2022 CLINICAL DATA:  Low back pain EXAM: LUMBAR SPINE - 2-3 VIEW COMPARISON:  Radiographs 05/11/2021 FINDINGS: No evidence of acute fracture. Slight retrolisthesis L2, L3, L4 and anterolisthesis L5. Bilateral L5 pars defects. No lytic lesions are better assessed on prior CT. Multilevel spondylosis and disc space height loss. Disc space height loss is greatest at L5-S1 where it is moderate. Advanced lower lumbar facet arthropathy. Aortic atherosclerotic calcification. IMPRESSION: No evidence of acute fracture. Multilevel degenerative changes as described. Known lytic  lesions are better assessed on CT. Electronically Signed   By: Placido Sou M.D.   On: 05/15/2022 15:02     ASSESSMENT & PLAN Esaias Cleavenger Tiller 66 y.o. male with medical history significant for IgG Kappa multiple myeloma who presents for a follow up visit.   After review of the labs, review of the records, and discussion with the patient the findings are most consistent with an IgG kappa multiple myeloma.  The patient has 2 lytic lesions within the pelvic bones (more noted in spine on CT scan) as well as 10% plasma cells within the bone marrow biopsy.  This combined with his serological findings confirm the diagnosis of multiple myeloma.  The initial treatment of choice for this patient's multiple myeloma was VRd. This will consist of bortezomib 1.66m/m2 on days 1, 8, 15, dexamethasone 442mon days 1,8, and 15, and revlimid 259mO daily days 1-14. Cycles will consist of 21 days. We will use this regimen to stabilize the patient's myeloma, then make a referral to a BMT center of his choosing for consideration of a bone marrow transplant once he has been noted to have a good response (VGPR or better). Zometa was started after dental clearance was obtained. This will be continued x 2 years.   Unfortunately had a drug reaction to [name] and was admitted to the hospital from 06/15/2021 until 06/18/2021.  The patient underwent a B AL which clearly showed evidence of eosinophils.  This is consistent with a drug reaction most likely caused by the patient's [name].  Discussed the case with pulmonology who recommended that we not rechallenge for attempt other drugs in the same class such as pomalidomide.  [facility] preference would be to continue that.  As such I would recommend we proceed with CyBorD chemotherapy.  Daratumumab-based therapy could have been considered, but is often paired with an immunologic.  Additionally I would like to preserve those  for additional lines of therapy if necessary.  Therefore we will proceed with CyBorD chemotherapy have the patient referred to transplant.  R-IPSS score: Stage 2. PFS of 42 months  # IgG Kappa Multiple Myeloma (t11;14, standard risk)  --diagnostic criteria was met with monoclonal plasma cells in the bone marrow and lytic lesions on the bone --recommend proceeding with VRd chemotherapy as noted above --patient is young and healthy enough for consideration of BMT, though he is borderline with his age. Will consider referral pending response to treatment.  -- Cycle 1 Day 1 started on 03/16/2021 --decreased dexamethasone to 70m66m weekly due to fluid overload. Also started lasix 70mg43m(changed on  06/01/2021) --plan to HOLD revlimid moving forward after evidence of drug reaction in the lungs. --started CyBorD chemotherapy on 06/29/2021 --patient has reached a VGPR. He is scheduled to see [facility] BMT next week.  Plan: --will continue to HOLD chemotherapy at this time, allowing his respiratory status to recuperate and pulmonology to help optimize his breathing prior to rechallenging him --ILD is not improving on steroid therapy. Pulmonology performed bronchoscopy with biopsies. Patient has received an opinion at [facility] and most recently [facility]  --At the risk of worsening his lung function will continue to hold therapy.  On review of notes from the [facility] the patient was offered Dara/[name]/dexamethasone as Therapy.  I do believe this would be reasonable with the patient was aware of the risks and benefits regarding possible worsening of the lung function. -- Labs today show rising M protein currently at 0.5.  Serum free light chains remain stable with a kappa of 27.9 and ratio 2.88.  White blood cell 7.5, hemoglobin 30.2, and platelets of 240. --RTC 4 weeks to readdress with labs.  #Drug Reaction in the Lung --confirmed with increased eosinophils on BAL during admission for shortness of  breath --currently on a steroid taper as managed by [facility]. --patient will continue to follow up with pulmonology.   #Supportive Care -- chemotherapy education complete --zometa therapy started on 04/05/2021 (4 mg IV q 3 months), dose administered today.  --ASA 66m PO daily for thromboprophylaxis on revlimid -- zofran 868mq8H PRN and compazine 1059mO q6H for nausea -- acyclovir 400m73m BID for VCZ prophylaxis -- no pain medication required at this time.   No orders of the defined types were placed in this encounter.  All questions were answered. The patient knows to call the clinic with any problems, questions or concerns.  A total of more than 30 minutes were spent on this encounter with face-to-face time and non-face-to-face time, including preparing to see the patient, ordering tests and/or medications, counseling the patient and coordination of care as outlined above.   JohnLedell [name] Department of Hematology/Oncology [facility]: [phone]: [phone]: [email or web address]  05/25/2022 3:12 PM

## 2022-05-27 ENCOUNTER — Other Ambulatory Visit: Payer: Medicare Other

## 2022-05-30 ENCOUNTER — Other Ambulatory Visit: Payer: Self-pay

## 2022-06-01 ENCOUNTER — Other Ambulatory Visit: Payer: Self-pay

## 2022-06-03 ENCOUNTER — Other Ambulatory Visit: Payer: Self-pay | Admitting: Hematology and Oncology

## 2022-06-03 NOTE — Telephone Encounter (Signed)
Seems like encounter was open in error so closing encounter.  

## 2022-06-06 ENCOUNTER — Ambulatory Visit (INDEPENDENT_AMBULATORY_CARE_PROVIDER_SITE_OTHER): Payer: Medicare Other | Admitting: Internal Medicine

## 2022-06-06 ENCOUNTER — Encounter: Payer: Self-pay | Admitting: Internal Medicine

## 2022-06-06 VITALS — BP 126/80 | HR 94 | Ht 70.0 in | Wt 211.0 lb

## 2022-06-06 DIAGNOSIS — J849 Interstitial pulmonary disease, unspecified: Secondary | ICD-10-CM

## 2022-06-06 MED ORDER — PREDNISONE 10 MG PO TABS
ORAL_TABLET | ORAL | 0 refills | Status: DC
Start: 2022-06-06 — End: 2022-06-17

## 2022-06-06 NOTE — Progress Notes (Signed)
Spirometry/DLCO performed today. 

## 2022-06-06 NOTE — Addendum Note (Signed)
Addended by: Lorretta Harp on: 06/06/2022 03:10 PM   Modules accepted: Orders

## 2022-06-06 NOTE — Patient Instructions (Addendum)
ILD (interstitial lung disease) ([place]) Drug-induced pneumonitis Chronic respiratory failure with hypoxia ([place]) Encounter for medication counseling   Currently symptoms of back pain and fatigue and increased shortness of breath and cough.  Pulmonary function test is still better than late last year 2022 but is decline compared to May 2023 at [facility].  Unclear if these are all related to lack of prednisone  OR myeloma returning  OR  got a mild asthma/ILD flareup  Walk test itsef is stable over time  Noted that you reported that the myeloma is recurring  Noted that you are on pirfenidone for 2 weeks and tolerating it well    Plan  -Albuterol as needed -Empiric short course of prednisone please take  - Take prednisone '40mg'$  once daily x 3 days, then '30mg'$  once daily x 3 days, then '20mg'$  once daily x 3 days, then prednisone '10mg'$  once daily  x 3 days and stop  -Continue pirfenidone per schedule increasing it to 3 pills 3 times daily tomorrow and then staying there  -Take after meals  -Apply sunscreen  -Take at least 5 to 6 hours apart  - o2 for goal pulse ox > 88%  -Check liver function test today or within the next 4 weeks [last checked 05/17/2022]  -Function test monitoring needed every month for 6 months and then every 3 months after that while on pirfenidone   Obesity (BMI 30.0-34.9)  Congratulations on ongoing weight loss that is intentional  Plan -get myfitness pal app and slowly lose weight - glad there is weight loss  - some people believe that vegeterian diets are best Marlou Sa Ornish, Tawni Millers) - talk to PCP Antony Contras, MD about [facility]  Myeloma    Recurrign per history  Plan  - per [facility] and Dr Burgess Estelle Spiro/dlco in 8  weeks Kamla Skilton or APP in  9 weeks  - symptoms/wak test at followu

## 2022-06-06 NOTE — Patient Instructions (Signed)
Spirometry/DLCO performed PFT performed today.

## 2022-06-06 NOTE — Progress Notes (Signed)
IOV 10/04/2011  66 year old male. Allergies and possible asthma. Hypothyroidism. Anxiety state but no formal diagnosis. Has history of claustrophobia as well Non-smoker. Music therapist. Dad with CAD - s/p CABG at age 14 (now 60y)  Referred by Dr Donneta Romberg. Reports dyspnea. INsidious onset "all my life". Increased after starting allergy shots in June 2012. Says during hikes after he gets going it gets better. Same in gym. Notices more when he is starting out or stationary at rest. HE is not sure what it is.  Feels he needs to take a deep breath on occasion. So, now referred to cardiology and pulmonary. Says ICS Qvar in august/sept 2012 made it worse. Outside chart mentions asthma but patient says not sure if he has asthma. But reports childhood hx of asthma diagnosed by a pediatrician at age 84. Says he had similar symptoms.  Up until 1990 used to 10K but even back then had similar symptoms and would need to warm up before feeling better. Then stopped running due to neck problems. Similar during swimming exercise in 1996-1997. Quit swimming 1999. Episodes associated with wheezing but no cough. Unclear if albuterol helps but on xopenex prn esp before walking. This whole thing feels like he is not getting "enough juice". Never had PFT but recollects normal spirometry at Dr Rush Landmark office.  He has seen Dr Gwendel Hanson cardiology for above - treadmill and echo and carotid doppler all pending. Current pulmonary modulating drug is Singulair only  Chest x-ray 07/16/2011 is clear per my personal review.  xxxxxxxxxxxxxxxxxxxxxxxxxxxxxxxxxxxxxxxxxxxxxxxxxxxxxxxxxxxxxxxxxx  Inpatient consult 06/16/21 66 y/o male with a history of multiple myeloma diagnosed in 2022, currently undergoing treatment presented to the [facility] emergency department from his hematology oncology appointment with a chief complaint of dyspnea.  The patient smoked cigarettes briefly as a young adult but since then has lived a relatively  healthy lifestyle staying active with exercise and playing golf.  He was diagnosed with multiple myeloma in the spring 2022 when a bruise was discovered while he was receiving a sports massage.  He went for further work-up and was found to have abnormal lab work, this led to a bone marrow biopsy confirming diagnosis of multiple myeloma.  He has been followed by Dr. [name] who has been treating him with VRd chemotherapy since Mar 16, 2021.  He is currently in the middle of his fifth cycle.     Over the last several weeks he has been developing progressive shortness of breath with exertion.  He says this has been going on for about 3 to 4 weeks and has significantly worsened in the last week.  This is associated with a dry, rare cough.  He denies fevers, chills, leg swelling, weight gain, or mucus production.  In the last several days (just prior to admission) [place] the [place] and while there felt significant shortness of breath so he presented to Dr. Libby Maw office for further evaluation.  He was sent to the emergency room for evaluation further because of the severity of his shortness of breath.  In the emergency department he was noted to have hypoxemia to an O2 saturation of 87% and an abnormal chest exam.  Pulmonary and critical care medicine was consulted for further evaluation.  He says that throughout his adult life he has had what he calls "huffing" when he talks while exerting himself or with exertion.  He says he has been followed by an allergist and has been treated for asthma  at times over the years.  He does not regularly use an inhaler and he says this is not something that he routinely needs.  He was seen by my partner Dr. [name] at 1 point in 2013 during which time he had a normal chest x-ray and normal lung function testing.   He had COVID in April 2022.   June 15, 2021 admission August 26 CT chest images independently reviewed: No pulmonary embolism, diffuse interlobular septal  thickening, pleural nodularity noted, areas of groundglass opacification with some patchy distribution particularly in the lung base, no significant pleural effusion, mild reactive appearing lymphadenopathy in the mediastinum Acute respiratory failure with hypoxemia in the setting of diffuse parenchymal lung disease of undetermined etiology: This is somewhat of a complex case given his underlying asthma and the fact that he had COVID in 2022 and in April 2022 a CT scan of his abdomen showed some ill-defined interstitial changes in the periphery of his lungs on lung windows in the bases.  Based on the time course of his illness and reports in the literature I am most concerned about [name] lung toxicity (see citation below), though it is also important to evaluate for another underlying and progressive primary pulmonary ILD or less likely an opportunistic infection (history doesn't seem to support this).  [name] can cause eosinophilic pneumonia which we can assess for with a bronchoscopy, but his imaging characteristics aren't typical for this.  It is possible that the non-specific interstitial changes seen on his lungs in April 2022 were related to his recent COVID infection.  I don't think his baseline asthma explains his symptoms, though he did have a high serum eosinophil count a few weeks prior to admission (unclear significance).  Saglam B, Paulita Fujita M, Ornek S, Keske S, Tabak L, Cakar N, Zeren H, Aytekin S, Perry, Ferhanoglu B. Bortezomib induced pulmonary toxicity: a case report and review of the literature. Am J Blood Res. 2020 Dec 15;10(6):407-415. PMID: 70263785; PMCID: [id number].  06/18/21 -   Results for Samuel, [name]" (MRN [id number]) as of 07/12/2021 09:13  Ref. Range 06/18/2021 12:57  Monocyte-Macrophage-Serous Fluid Latest Ref Range: 50 - 90 % 5 (L)  Other Cells, Fluid Latest Units: % CORRELATE WITH CYTOLOGY.  Color, Fluid Latest Ref Range: YELLOW  PINK (A)  Total  Nucleated Cell Count, Fluid Latest Ref Range: 0 - 1,000 cu mm 103  Fluid Type-FCT Unknown BRONCHIAL ALVEOLAR LAVAGE  Lymphs, Fluid Latest Units: % 18  Eos, Fluid Latest Units: % 51  Appearance, Fluid Latest Ref Range: CLEAR  HAZY (A)  Neutrophil Count, Fluid Latest Ref Range: 0 - 25 % 26 (H)  FINAL MICROSCOPIC DIAGNOSIS:  - No malignant cells identified  - Benign bronchial cells and pulmonary macrophages   Titrate off solumedrol and start prednisone 50 mg daily. Decrease by 10 mg weekly    OV 07/12/2021  Subjective:  Patient ID: Samuel [name], male , DOB: 02/20/1956 , age 10 y.o. , MRN: [id number] , ADDRESS: [place] [id number] PCP Antony Contras, MD Patient Care Team: Antony Contras, MD as PCP - General (Family Medicine)  This Provider for this visit: Treatment Team:  Attending Provider: Brand Males, MD    07/12/2021 -   Chief Complaint  Patient presents with   Hospitalization Follow-up    Pt states he was doing better after being out of the hospital after being placed on steroids. States about a week after being out, states he started having more  problems with SOB and to today, he has had problems with SOB with exertion.   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 -presents for follow-up from the hospital.  I originally met him 10 years ago and then at that point in time he had dyspnea.  He tells me that after that he was exercising and living a good life.  Then in April 2022 got diagnosed incidentally with myeloma multiple.  Says he was caught early.  Then in May 2022 started on [name] and [name].  He says he was doing well with this up until mid July.  Up until then he was walking 1-1/2 2 miles per day 5 days a week.  Then in late July 2022 started noticing shortness of breath.  He met Dr. [name] his oncologist on May 25, 2021.  By June 01, 2021 he had severe pulmonary  infiltrates.  He says a part of then he was getting his [name] once a week along with some steroids with each dose.  At this point in time the steroid dose was cut down but he progressed and started getting worse.  Then on June 15, 2021 he was in significant respiratory distress and admitted to the hospital.  I reviewed the hospital records.  He was there for 3 days.  He underwent bronchoscopy with BAL there is also 51% eosinophilia.  Cultures are negative malignant cells are negative.  He was seen by my pulmonary colleagues.  Discussion was held with the [name] other [name] was the etiology.  Given the use and failure it looks like his [name] has been held.  He was discharged on 50 mg prednisone with advised to taper it by 10 mg/week.  Currently is on 20 mg prednisone.  He started playing golf again and feeling really good through Labor Day weekend..  Then on June 29, 2021 his [name] was not given.  Up until this point all chemo was held.  He was given Cytoxan, Zomenta and [name].  The very next in June 30, 2021 he started feeling worse.  He feels [name] is the etiology for this.  On July 06, 2021 he communicated this with Dr. [name] and all chemo has been held.  He is being referred to the bone marrow transplant program at [facility] for further evaluation.  At this point in time he says he is much better than when he was in the hospital but definitely not as good as he was prior to getting a rechallenge with [name] on June 29, 2021.  But he notices that he slowly improving.  Walking desaturation test shows that his pulse ox is holding up although he does have a tendency to desaturate.   Did extensive review of the literature.  According to up-to-date [name] is a known etiology for pulm eosinophilia but he got worse after getting rechallenged with [name].  Review of the literature shows that [name] can cause drug-induced pneumonitis and a small fraction of patients.  The  literature is  NO INFOR  as to whether these patients have eosinophilia or not but definitely they seem to be steroid responsive.  This literature is attached below.  In the timeframe also fits in with [name].  He is also noticed to be on allopurinol which rarely can cause hypersensitivity syndrome or dermatitis, hepatitis and eosinophilia - ADDRESS Syndrome but he did not have thse features of rash etc     Nevertheless significant eosinophilia  in the BAL 51% suggest drug-induced pneumonitis.  He currently has significant symptoms of shortness of breath.  He does not have oxygen with him at home.  Simple office walk 185 feet x  3 laps goal with forehead probe 07/12/2021    O2 used ra   Number laps completed 3   Comments about pace slow   Resting Pulse Ox/HR 100% and 69/min   Final Pulse Ox/HR 92% and 81/min   Desaturated </= 88% no   Desaturated <= 3% points Yes 8   Got Tachycardic >/= 90/min no   Symptoms at end of test No complaints   Miscellaneous comments x        Causes of Pulmonary Eosinophilia: from UPTODATE A. Nonsteroidal antiinflammatory drugs (NSAIDs) B. Nitrofurantoin  C. Minocycline D. Sulfonamides/ Sulfamethoxazole E. Ampicillin F. Daptomycin G.Vancomycin H. Dapsone I. Beta-lactam antibiotics J. Nevirapine K. Telaprevir L. Sulfasalazine M. Methotrexate N. Mesalamine O. Amiodarone P. Bleomycin Q. ACE Inhibitor R. Beta blocker S. Hydrochlorothiazides T. L- Tryptophan U. Allopurinol V. Carbamazepine W. Lamotrigine X. Phenytoin Y. Phenindione Z. Fluindione AA. Olanzapine AB. Oxcarbazepine AC. Strontium Ranelate AD. Lenalidomide AE. Radiographic contrast media  Saglam B, Kalyon H, Ozbalak M, Ornek S, Keske S, Tabak L, Cakar N, Zeren H, Aytekin S, [place], [place] B. Bortezomib induced pulmonary toxicity: a case report and review of the literature. Am J Blood Res. 2020 Dec 15;10(6):407-415. PMID: 73532992; PMCID: [id number]. - Bortezomib  related pulmonary toxicities are rarely reported. Although the incidence of Bortezomib induced lung injury (BLI) is unknown, in a large registry study of 1010 MM patients, 45 patients were reported to have BLI by their physician. However, the causality could only be constituted in 26 patients (2.6%), with 5 of them resulting in death despite steroid treatment.In the case reports, the average number of RVD cycles until the toxicity was presented was 6.9, and the period between the development of pulmonary toxicity and the first dose of Bortezomib was 31.1 days -No mention of eosinophilia  Bortezomib therapy-related lung disease in [place] patients with multiple myeloma: Incidence, mortality and clinical characterization Dyann Ruddle [facility] Miyao,2 [place] Masahiko Kusumoto,5 [place] Fumikazu [facility],8 [place] Sugiyama,8 [place]iy[place]iko Hatake,9 [place] Pao Fukuda,10 and Ebony patients registered, 64 (4.5%) developed BILD, 5 (0.50%) of whom had fatal cases. The median time to BILD onset from the first bortezomib dose was 14.5 days, and most of the patients responded well to corticosteroid therapy. A retrospective review by the Lung Injury Medical Expert Panel revealed that the types with capillary leak syndrome and hypoxia without infiltrative shadows were uniquely and frequently observed in patients with BIL - no mention of eosinophilia  CellularOperator.fi =- allopurinol complex multisystem Hypersensitivityey   has a past medical history of Asthma, High cholesterol, Perennial allergic rhinitis, Sciatic pain, right, Seasonal allergic rhinitis, and Thyroid disease.   reports that he quit smoki     07/20/2021  - Visit, NP [name]    66 year old male former smoker followed in our office for shortness of breath and interstitial lung disease.  Established with Dr. [name].  Initially  consulted when inpatient with our team in August/2022.  Chief complaint at that point time was dyspnea.  He was diagnosed with multiple myeloma in the spring 2022 when a bruise was discovered while he was recovering from a sports massage.  He went for further work-up and he was found to have abnormal lab work this led to a  bone marrow biopsy confirming diagnosis of multiple myeloma.  He has been followed by Dr. [name].  He was in the middle of treating him with VRD chemotherapy since May/20 04/2021.  He was in the middle of his fifth cycle.  Patient also had Margaret in April/2022.  Chest CT in April/2022 showed ill-defined interstitial changes in the periphery.  Concern for [name] lung toxicity.  Patient also had a bronchoscopy performed in 06/18/2021.  Eosinophils 51%, lymphs 18%, no malignant cells identified patient was started on a long-term prednisone taper.  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  Still currently having significant symptoms of shortness of breath.    Last seen in our office on 07/12/2021 by Dr. [name].  Plan of care at that office visit was as follows: Stop [name], check chest x-ray, check CBC with differential, blood ESR, blood IgE, vitamin D, hemoglobin A1c, G6PD, walk test, check Ono at home, change prednisone to 50 mg daily for 2 weeks, then 40 mg daily for 2 weeks, then 30 mg daily for 2 weeks, then 20 mg daily for 2 weeks, 2-week follow-up with APP or Dr. [name], 4-week follow-up with Dr. [name] and 30-minute time slot.  Per chart review on 07/13/2021 [name] was discontinued.  Patient was also seen by Dr. [name] on 07/18/2021 I am unable to view this note.  Patient reporting that weight team would like for him to resume [name] at a maintenance dose of 10 mg.  This is the current plan.  He will see oncology next Friday for blood work.  Patient is scheduled to complete an overnight oximetry test next Tuesday.  He remains  adherent to his prednisone taper 50 mg daily.  Patient reports that this weekend he did play 9 holes of golf on Saturday and on Sunday.  He was limited by his physical exertion.  He also exercised on Tuesday and worked out with a Clinical research associate.  Patient is eager to get back to baseline physical activity.  Patient reports that on Wednesday (07/18/2021) of this week he had increased shortness of breath, cough, worsened acid reflux and he vomited.  Patient believes that this may also be due to the fact that he ate a dinner meal quite quickly.  This sometimes happens when he does this.  He also reports that his blood pressure was high at that time.  Patient reports that he has been off the [name] for at least 1 month.  Patient has stopped his allopurinol as of 07/18/2021.  Patient and spouse are both frustrated regarding dyspnea and have hopes that he would be improving quicker.  There are also concerns that he may have acute worsened symptoms or an acute infection such as bronchitis.  We will discuss and evaluate for this today.  Walk today in office: 07/20/2021-completed 2 laps on room air, dropped to 93%  07/26/2021- Interim hx  Patient presents today for 1 week follow-up. ILD felt to be related to drug pneumonitis from [name] as well as [name] for his treatment of multiple myeloma. BAL showed significant eosinophilia. Dr. [name] lowered dose of [name] to 54m daily. CXR on 07/20/21 showed chronic ILD, no definite acute findings. Ambulatory walk during his last visit showed no oxygen desaturations. During his last visit he was ordered for HRCT, PFTs and ONO.   Accompanied by his wife. He had a bad weekend, his respiratory symptoms have been slightly better the last two days. He is currently off BOTH [name] and [name] (may  retry [name] at lower dose). He stopped using BREO d/t throat irritation. Xanax has helped relieves some anxiety and chest tightness. Wife reports that he is sleeping better. He in  on prolonged prednisone taper. He will be starting 21m prednisone tomorrow  x 2 weeks. He is taking Singulair and generic fluticasone nasal spray. He has been off allergy shots since August. HRCT and PFTs are scheduled for next week. Awaiting results to be faxed for ONO from [employer].    OV 08/02/2021 -   Subjective:  Patient ID: VBillee [name] male , DOB: 127-Nov-1957, age 66y.o. , MRN: [id number], [place]: [place] [id number] SAntony Contras MD Patient Care Team: SAntony Contras MD as PCP - General (Family Medicine)  This Provider for this visit: Treatment Team:  Attending Provider: RBrand Males MD  Type of visit: Telephone/Video Circumstance: COVID-19 national emergency Identification of patient Samuel COMMONSwith 109-06-1957and MRN [id number]- 2 person identifier Risks: Risks, benefits, limitations of telephone visit explained. 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 CT abdomen/pelvis: Multiple small lytic lesions in the thoracolumbar spine and bilateral pelvis -SPEP: IgG 2,082 (H), M Protein 1.8 (H). IFE shows IgG monoclonal protein with kappa light chain specificity.  -LDH 169, CBC normal, CMP normal except for sodium 131 (L), Chloride 96 (L).   02/14/2021: Establish care with IDede QueryPA-C 02/22/2021: bone marrow biopsy confirms the diagnosis of Multiple Myeloma with a monoclonal plasma cell population.  03/16/2021: Cycle 1 Day 1 of VRd chemotherapy  04/06/2021: Cycle 2 Day 1 of VRd chemotherapy  04/27/2021: Cycle 3 Day 1 of VRd chemotherapy  05/18/2021: Cycle 4 Day 1 of VRd chemotherapy  06/01/2021: drop  dexamethasone to 282mPO weekly and start lasix due to shortness of breath.  06/15/2021: Desaturation to 87% on ambulation. HELD velcade today and sent to ED for evaluation.  06/22/2021: Findings consistent with drug reaction the lungs with eosinophils on BAL.  Given these findings we will definitely hold [name] and plan to avoid pomalidomide 06/29/2021: Cycle 1 Day 1 CyBorD chemotherapy   HPI ViBufford [name] [age].o. -there is a telephone visit.  He is supposed to see me next week but he had a CT scan of the chest 2 days ago and had pulmonary function test today.  He really wanted to discuss the results today itself.  Review of the records indicate that he still on prednisone taper.  He is able to ambulate and desaturated only after 200 feet.  There are some mild nocturnal desaturation.  We started 2 L of nasal cannula oxygen.  His CT scan of the chest shows diffuse groundglass opacity in a pattern that is inconsistent with UIP [less than 40% chance this is UIP] suggestive of alternate pattern.  He says he is able to do weight training exercises well but when he walks on a treadmill or does ambulation that is when it bothers him.  When he rests he is better.  He does desaturate to 80s percent.  He is wondering if this could be from asthma.  I told him otherwise.  Touch base with Dr. DoLorenso Courieris oncologist.  He  is scheduled for cyclophosphamide tomorrow along with low-dose [name].  This was held off recently in September 2022.  But oncology is wanting to rechallenge him with low-dose [name].  They wanted approval for this.   ONO RA 07/24/21   - - 45 min spent <88%. 7+ hours was > 90%. OVerall not bad  Plan  Start 2L [place] QHS  Results for NASARIO, CZERNIAK" (MRN [id number]) as of 08/02/2021 12:16  Ref. Range 08/02/2021  08/02/2021   FVC-Pre Latest Units: L 1.55   FVC-%Pred-Pre Latest Units: % 33   Results for JUMAANE, WEATHERFORD" (MRN [id number]) as of 08/02/2021 12:16  Ref. Range  08/02/2021    DLCO cor Latest Units: ml/min/mmHg 12.52   DLCO cor % pred Latest Units: % 46   Results for Samuel [name], Samuel [name]" (MRN [id number]) as of 08/02/2021 12:16  Ref. Range 08/02/2021    TLC Latest Units: L 4.18   TLC % pred Latest Units: % 59    Results for Samuel [name], HEISLER" (MRN [id number]) as of 08/02/2021 12:16  Ref. Range 07/12/2021 09:54  G-6PDH Latest Ref Range: 7.0 - 20.5 U/g Hgb 14.8   CT Chest data  CT Chest High Resolution  Result Date: 07/31/2021 CLINICAL DATA:  Evaluate for interstitial lung disease EXAM: CT CHEST WITHOUT CONTRAST TECHNIQUE: Multidetector CT imaging of the chest was performed following the standard protocol without intravenous contrast. High resolution imaging of the lungs, as well as inspiratory and expiratory imaging, was performed. COMPARISON:  Chest CT dated June 15, 2021; abdomen and pelvis CT dated February 02, 2021 FINDINGS: Cardiovascular: Cardiomegaly with trace pericardial effusion. Coronary artery calcifications of the RCA and LAD. Atherosclerotic disease of the thoracic aorta. Mediastinum/Nodes: Esophagus is unremarkable. Atrophic thyroid. Mediastinal lymph nodes are decreased in size when compared with prior exam. Reference AP window lymph node on series 2, image 42 measures 1.1 cm in short axis, previously 1.3 cm. Lungs/Pleura: Central airways are patent. Images are motion degraded. Mild diffuse ground-glass opacity with peribronchovascular and subpleural reticular glass opacities and traction bronchiectasis. No clear craniocaudal predominance. Mild bilateral air trapping. 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 Stable solid right middle lobe pulmonary nodule measuring 3 mm on series 3, image 57. Upper Abdomen: No acute abnormality. Musculoskeletal: No chest wall mass or suspicious bone lesions identified. IMPRESSION: Limited evaluation due to respiratory motion artifact. Within limitations, there are diffuse bilateral  ground-glass opacities with peribronchovascular and subpleural reticular opacities, traction bronchiectasis and no clear craniocaudal predominance. Differential considerations include sequela of acute lung injury, NSIP, or fibrotic HP given presence of air trapping. Mild subpleural reticular opacities were present on visualized portions of the lung on prior abdomen and pelvis CT dated February 02, 2021, although majority of findings are new. Findings are suggestive of an alternative diagnosis (not UIP) per consensus guidelines: Diagnosis of Idiopathic Pulmonary Fibrosis: An Official ATS/ERS/JRS/ALAT Clinical Practice Guideline. [place], Iss 5, [id number], Jun 21 2017. Small solid pulmonary nodule the right middle lobe measuring 3 mm. No follow-up needed if patient is low-risk. Non-contrast chest CT can be considered in 12 months if patient is high-risk. This recommendation follows the consensus statement: Guidelines for Management of Incidental Pulmonary Nodules Detected on CT Images: From the Fleischner Society 2017; Radiology 2017; 284:228-243. Aortic Atherosclerosis (ICD10-I70.0). Electronically Signed   By: Yetta Glassman M.D.   On: 07/31/2021 12:01         08/16/2021 -  fu drug  induced pneumonitis   HPI Samuel [name] 66 y.o. -75md desaturated. STarted on portable o2 since yesterday and Is feeling better. On Room air say he is desaturated.  Called to discuss Bronch . He is scheduled to see DR PAtwardhan 08/22/21 wed at 8.30am. PRefers not to have bronch done at that that tme.  Discussed the consensus about having bronchoscopy with lavage to rule out any opportunistic infections.  At this time I took the opportunity of also recommending transbronchial biopsies.  He did have transbronchial biopsies when he was a lot more hypoxemic in August 2022 we will send for histopathology and was nondiagnostic.  This time I recommended we send it off for RNA genomic classifier for UIP it is  not a sensitive test but it is specific.  If it comes back positive then we would know that there is permanency to this and also its a marker of progression potentially.  He is willing to go through this.  Explained we under general anesthesia. Based on schedule will be myself or Dr. IValeta Harmsdoing it.  Explained the following risks   Risks of pneumothorax, hemothorax, sedation/anesthesia complications such as cardiac or respiratory arrest or hypotension, stroke and bleeding all explained. Benefits of diagnosis but limitations of non-diagnosis also explained. Patient verbalized understanding and wished to proceed.    We then discussed pulse dose steroids.  Told him we will have to wait close to a week to make sure there is fungal smears PCP and AFB smears and bacterial culture negative.  At that point we will have to take a decision on giving 1 g Solu-Medrol for the a day for 3 days.  Ideally would need admission he does not want this.  He wants to do with is on an outpatient setting ideally.  Explained to him about the risks with steroids such as hyperglycemia, hypertension but said that I would try to work with the DME company or outpatient/home nursing to see if this would be possible.  He was appreciative.  Also discussed with Dr. IValeta Harmswho is willing to do the biopsy based on schedule of the operating room, myself and the patient if things do not work out.  Sent a secure chat to Dr. PVirgina Jock  Told him about the patient.  He will give [place] clearance on 08/22/2021.  Recommended also look for BNP and heart failure.  CT Chest data  No results found.   OV 08/09/2021  Subjective:  Patient ID: VBillee [name] male , DOB: 1November 21, 1957, age 66y.o. , MRN: [id number], [place] [address] [id number] SAntony Contras MD Patient Care Team: SAntony Contras MD as PCP - General (Family Medicine)  This Provider for this visit: Treatment Team:  Attending Provider: RBrand Males  MD    08/09/2021 -   Chief Complaint  Patient presents with   Follow-up    Pt states he is about the same since last visit, maybe a little better. Pt is coughing more at times and states he does cough a lot before bed.   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 -returns for follow-up.  He is finishing up 40 mg of prednisone per day.  Restarting 30 mg/day of prednisone tomorrow.  He is not really better.  Today he was able to only walk 2 out of the customary 3 laps.  And he showed a tendency to desaturate.  He  says he is extremely anxious.  This is understandable.  He also states that when he lifts weights in the gym he does not have a problem but when he exerts himself he feels worse.  Previously his nocturnal desaturation test showed abnormality and we recommended night oxygen but he declined per the CMA.  But today he and his wife tell me that they would be interested in getting some oxygen if it would help with shortness of breath.  Simple walking desaturation test does not make him qualify and will need a 6-minute walk test to qualify for portable oxygen.  They are also worried about the lack of improvement.  They are wondering about second opinion.  I did unofficially check with some of my colleagues in the [place].  No clear-cut plan has been developed.  We discussed about the possibility of visiting Dr. Virgel Manifold, ILD group in [place].  They seemed enthused about the idea but wanted me to reach out to Dr. Ovid Curd first.  In terms of his myeloma review of the medical records from office visit 08/03/2021 with Dr. [name] and talking to the patient and the labs show the myeloma still in remission.  But Dr. [name] is extremely concerned that the myeloma will come back.  Oncology still wants to rechallenge him with [name] but after my personal discussion with Dr. [name] on  08/02/2021 and concern for pulmonary toxicity chemotherapy is on hold till patient recovers.  He is also concerned about the presence of coronary artery calcification on his recent CT scan of the chest.  His dad had coronary artery disease diagnosed at 46 and had bypass.  He wants to visit this with Dr. Einar Gip again       08/31/2021 -video visit to discuss bronchoscopy results from 08/28/2021 and next step in plan    HPI Samuel [name] 66 y.o. -his bronchoscopy with lavage 08/28/2021 shows 40% lymphocytes.  Anything greater than 30% is against UIP.  His RNA genomic classifier is in progress and that would be a specific test although not a ensitive test for UIP.  Bacterial cultures are negative.  He continues have shortness of breath and on room air at rest he is fine but sometimes when he goes to the bathroom he can desaturate into the high 80s.  He is frustrated by his condition.  Our original plan was to ensure no opportunistic or bacterial infections [fully understanding that MTB and fungal infections can take 6 weeks] with the current bronchoscopy and to consider bronchoalveolar lavage.  And then based on this we will schedule pulsed dose steroids.    Recently his G6PD is returned is normal.  He is not on Bactrim for prophylaxis.  Current prednisone Is 43m per day.  He prefers outpatient treatment plan.  We discussed the side effects of high-dose steroids including opportunistic infection, anxiety and irritability, hypertension, diabetes, other lab abnormalities.  Explained the upset benefit of potentially improving upon current ILD active inflammatory phase.  He is willing to go through this treatment.  He prefers outpatient.  He will require lab and vital sign monitoring.   his wife wanted to know if this could be sarcoidosis.  She is read some case reports of sarcoidosis and myeloma associated.  I expressed to them the clinical features do not fit in with sarcoidosis.  However we can check for  autoimmune and sarcoid features  Results for TVIDIT, BOISSONNEAULT (MRN [id number] as of 08/31/2021 16:38 ENVISIA - NEGATIVE FOR UIP  Ref. Range 06/18/2021 12:57 08/28/2021 16:51  Monocyte-Macrophage-Serous Fluid Latest Ref Range: 50 - 90 % 5 (L) 10 (L)  Other Cells, Fluid Latest Units: % CORRELATE WITH CYTOLOGY. MESOTHELIAL AND BRONCHIAL LINING CELLS  Color, Fluid Latest Ref Range: YELLOW  PINK (A) COLORLESS (A)  Total Nucleated Cell Count, Fluid Latest Ref Range: 0 - 1,000 cu mm 103 183  Fluid Type-FCT Unknown BRONCHIAL ALVEOLAR LAVAGE BRONCHIAL ALVEOLAR LAVAGE  Lymphs, Fluid Latest Units: % 18 40  Eos, Fluid Latest Units: % 51 0  Appearance, Fluid Latest Ref Range: CLEAR  HAZY (A) CLEAR (A)  Neutrophil Count, Fluid Latest Ref Range: 0 - 25 % 26 (H) 50 (H)    CT Chest data  DG CHEST PORT 1 VIEW  Result Date: 08/28/2021 CLINICAL DATA:  Status post bronchoscopy. EXAM: PORTABLE CHEST 1 VIEW COMPARISON:  Chest radiographs 07/20/2021 and CT 07/30/2021 FINDINGS: The cardiac silhouette is borderline enlarged. Lung volumes are chronically low and slightly lower than on the prior radiographs. The interstitial markings are chronically increased diffusely. No definite acute airspace consolidation, overt pulmonary edema, sizable pleural effusion, or pneumothorax is identified. Prominent gaseous distension of the stomach is partially visualized. IMPRESSION: Low lung volumes with chronic interstitial changes. Electronically Signed   By: Logan Bores M.D.   On: 08/28/2021 19:10   DG C-ARM BRONCHOSCOPY  Result Date: 08/28/2021 C-ARM BRONCHOSCOPY: Fluoroscopy was utilized by the requesting physician.  No radiographic interpretation.       OV 09/25/2021  Subjective:  Patient ID: Samuel [name], male , DOB: 19-Apr-1956 , age 37 y.o. , MRN: [id number] , ADDRESS: [place] [id number] PCP Antony Contras, MD Patient Care Team: Antony Contras, MD as PCP - General (Family  Medicine)  This Provider for this visit: Treatment Team:  Attending Provider: Brand Males, MD    09/25/2021 -   Chief Complaint  Patient presents with   Follow-up    Pt states that he is beginning to feel better after last visit. States he wears his O2 at 2L majority of the time.  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 JNarda Rutherford- 461monce weekly (for multiple myeloma chemotherapy N/V ppx)   04/09/21 - dexamethasone 50m49mabs - 40 tabs for 28 day supply - Dr. JohNarda Rutherford10m11mce weekly  (for multiple myeloma chemotherapy N/V ppx)   05/07/21 - dexamethasone 50mg 51ms - 40 tabs for 28 day supply - Dr. John Narda Rutherfordmg 101m weekly  (for multiple myeloma chemotherapy N/V ppx)   06/06/21 - dexamethasone 50mg ta15m- 40 tabs for 28 day supply - Dr. John DoNarda Rutherfordeased to 20mg on77meekly  06/18/21 - prednisone 10mg tab62m104 tabs for 34 day supply - Dr. Jennifer Dessa Phimg dail38m7 days, 40 mg daily x 7 days, 30 mg daily x 7 days, 20mg daily36m days, 10mg daily 71mdays, 5mg daily x 90mays   07/13/21 - prednisone 10mg tabs - 270mab for 30 day supply - Dr. Arelia Volpe ----Chase Callerily x 153mys, 40 mg daily x 14 days, 30mg daily x 1468ms, 20mg daily there35mr  - Mid Nov 2022  - 1gm solumedrol load x 3 days  as outpatient  - 09/25/2021 - 59m pred per day   HPI VGaberial CadaTosco 66y.o. -returns for follow-up.  Since his last visit we did a loading dose of Solu-Medrol 1 g daily x3 days.  This was in mid November 2022.  After that he is gone back to daily prednisone 20 mg/day.  In the midst of the high-dose steroid he did pick up hypertension and we gave him bisoprolol which he says has helped him significantly.   He is run out of the bisoprolol.  I have asked him to contact his primary care physician to manage his hypertension but we will give him a refill.    He is here for follow-up to see his current status.  He tells me that his effort tolerance is better.  He tells me in the gym he is able to do a little bit more work.  This is compared to a few months ago.  He does tell me that gym exercises are easier on him than climbing the stairs.  Stairs - he avoids and gets dyspneic. Not tested his pulse ox on stairs His subjective symptom profile is slightly better compared to October 2022 but his walking desaturation test is around the same.   So suspect amount of his interstitial lung disease might be better but suspect still remains.  Review of his pulmonary function test from 10 years ago was normal.  In April 2022 he had early ILD.  He currently definitely has ILD.  His RNA genomic classifier is negative.  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  This would make him eligible for nintedanib. He continues on prednisone 20 mg/day.  In terms of his myeloma: He had his wife say that it is still under remission but they are worried about relapse.  They are worried about future direction and treatment of myeloma particularly because he has had issues with treatment that then resulted in acute lung injury.   Of note  - he is frustrated by poor customer service of our office workflows - cChristella Scheuermanndenies his RNA genomic classifer biopsy    OV 10/19/21  S: call to give update on conversation with Dr DLorenso Courierhis hematologist  1. Myeloma - latest dec 2022 blood work back - still under remission. Dr DLorenso Courierindicated that highly unlikely he will be a BMT Candidate for myeloma if his lung. [facility] If not a B[facility] candidate - then cytoxan regimen short term would be used (indicated that is of ptioential benefit to lungs)  2. 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 Would need SLB to ID etiology preciesly but with myelom and Thereapuetic trial with steroids  and his presentation - this has not been a consideratgion till now.  Explained to Dr DLorenso Courierif ILD progresses futher - patient life expectancy is limited. Discussed with patient again and he is agreeable for prednisone 166mper day and starting ofev (awaiting donor samples and insurance proces in 2023).   3. WE discussed [facility] to see if lung transplant would be an option at all if he were to decline esp in  setting of myeloma. Maybe a BMT as well . Do not know answers but wil email Dr Doy Mince at [facility] to get patient in for visit. He might well need a surgical lung biopsy but this can be addressed in due course  Patient and wife agreeable  I spent time emailing Dr Serita Grit trasnplant doc at [facility] - later heard from Dr Doy Mince- feels that Myleoma will need to be in remssion for >= 5 years before he can be considered lung transplant evaluable. They feel no need to see Mr Dubin in transplant clinicl. I subsequetly d/w Mr Zulauf - will revert back to holding off lung transplant evaluation. He wil proceed with ofev. He will see [facility]MT team. Will cosndier a [facility] opinion after d;w hi     2nd Pulm Opinion at [facility] - Dr Kathi Ludwig    Comment: The patient seems to have an inflammatory process that has resulted in progressive loss of lung function, worsening hypoxia. Unfortunately on the most recent imaging I do see some signs of fibrosis including a few areas of traction bronchiectasis although I do not see honeycombing or profuse traction bronchiectasis. We did discuss  future therapies. I told him that unfortunately since his lung has already suffered injury I think he would be at risk for developing lung injury again and that many chemotherapeutics have been associated lung injury. I also told him these are idiosyncratic reactions and it was not possible to predict who on an individual basis would develop inflammation from chemotherapy to any specific agent. He brought up Cytoxan I told him that while Cytoxan issues for inflammatory lung disease on the other hand it has been associated with pulmonary inflammation. His inflammatory process is steroid responsive which is not unexpected given that eosinophils were found on BAL.  We will check oxygen assessment with exercise to see how much oxygen as needed with more than ordinary exertion.  Plan:    - Check oxygen assessment  Will discuss with his pulmonary provider Dr. [name] and then Dr. Feliciana Rossetti  Thank you for the opportunity to provide consultation for your patient. If I can be of further assistance please do not hesitate to contact my office.   [place] [facility]   ASSESSMENT / PLAN  1. Interstitial lung disease-severe DLCO reduction (33% predicted) 2. 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 [name] induced)-started August 2022 3. Faint/early ILD present in April 2022 (even prior to starting myeloma treatment) 4. Hypothyroidism-on replacement with Arm[place] thyroid 5. Dyslipidemia 6. History of gout 7. Chronic allergic rhinitis and some sinusitis-on nasal steroids 8. Childhood history of asthma 9. 25 year history of allergy shots (between ages 26 and 76)  2. Multiple myeloma-diagnosed April 2022-received about 5 cycles of [name]/[name]/dexamethasone until August 2022    There definitely appeared to be some very early changes of ILD even back in April 2022 prior to him starting any of his myeloma treatments. However, there definitely appears to be an acute interstitial pneumonitis type  picture noted on his CT scan from 06/15/2021. He was taken off all myeloma treatments at that time with concern for drug-induced pneumonitis with both [name] as well as [name] being considered probably agents. He subsequently underwent 1 additional cycle of myeloma treatment with [name] alone (without [name]) and had an acute exacerbation of dyspnea symptoms and ever since then has not received any further myeloma treatment.   I discussed with him that he has been on steroids since about  August 2022 and received a steroid bolus in November 2022 with very little change overall in his lung functions. I have mentioned his serial lung function numbers in the HPI above. I do not see any significant change in these numbers despite him receiving excellent care with Dr. [name] in [place].   His CT chest currently shows mostly fibrotic changes with maybe some active inflammatory component still left (although the extent of active inflammation appears to be very little as compared to the initial scan from August 2022).   I discussed with him that we have very little in terms of treatments to offer him. I do not think the addition of an IL 5 inhibitor or dupilumab would be of any benefit in this situation given that his BAL eosinophil count in November was down to 0% on steroids along with the fact that his current CT scan does not show much for active inflammation. Dr. [name] had discussed Cytoxan with him, but I think that that would be somewhat aggressive given that he is already got an active bone marrow problem with the myeloma and so I would discourage him from getting Cytoxan.  In terms of treatment options, we are really left with only 1 option which is to rechallenge him with high-dose steroids for the next 6-8 weeks and then reassess both his CT scan as well as pulmonary function studies. With this in mind, I have the following plan for him:  1. Increase oral prednisone from his 10 mg a day  that he has been currently using for the past month or so to 60 mg a day. I have outlined to taper for him on the prescription and he will see me back on the 30th of May when he is down to 50 mg a day of prednisone.   2. Continue Bactrim PCP prophylaxis. I have refilled the prescription for him.   3. Continue oxygen supplementation  4. Await the recommendations of our myeloma colleagues in Hematology  5. We cannot refer him for lung transplantation given the active diagnosis of multiple myeloma. This was discussed with him.  Overall, I told him that we likely are dealing with end-stage interstitial lung disease with very little in terms of active inflammation and something that is reversible even with the prednisone challenge I have outlined. He was given a prescription for Ofev by Dr. [name], but could not tolerate that due to diarrhea, nausea as well as vomiting. He has an active prescription for Esbriet, but I told him to hold off on that for the next 8 weeks while he is undergoing the prednisone trial so that we do not have any issues with him throwing up on the prednisone and potentially confusing the assessment of treatment response.  If we do not see any improvement in his lung function on CT scan on May 30th, we will slowly taper and discontinue the prednisone and have him start the [place].  With regards to multiple myeloma treatment going forward, I would definitely not challenge him with [name]. I am not so sure about the [name] and if that needs to be given to him in the future, we could potentially consider that under the cover of prednisone immunosuppression. This will need to be careful discussion between his Hematology and Pulmonary team given his severe interstitial lung disease in the lack of any wiggle room if he were to decline/have a flare of pneumonitis.  All questions were answered. The patient was satisfied with the visit. No learning  barriers identified during the  visit.  OV 12/04/2021  Subjective:  Patient ID: Samuel [name], male , DOB: [birth year] , age 53 y.o. , MRN: [id number] , ADDRESS: [place] [id number] PCP Antony Contras, MD Patient Care Team: Antony Contras, MD as PCP - General (Family Medicine)  This Provider for this visit: Treatment Team:  Attending Provider: Brand Males, MD  History of COVID-19 in the April  2022 Undiagnosed early ILD in the April 2022 Drug-induced interstitial lung disease - progressive phenotype - Aug 2022 BAL: pulm eosinophilia 51% in the setting of multiple myeloma chemotherapy - aug 2022  - Nov 2022 - BAL 40% lympocytoss 0% eos  - declined by [facility] for transplant eval dec 2022 - needs 5 year sof myelmoma remission    Prednisone history: 03/05/21 - dexamethasone 17m tabs - 40 tabs for 28 day supply - Dr. JNarda Rutherford- 455monce weekly (for multiple myeloma chemotherapy N/V ppx)   04/09/21 - dexamethasone 53m53mabs - 40 tabs for 28 day supply - Dr. JohNarda Rutherford39m32mce weekly  (for multiple myeloma chemotherapy N/V ppx)   05/07/21 - dexamethasone 53mg 21ms - 40 tabs for 28 day supply - Dr. John Narda Rutherfordmg 39m weekly  (for multiple myeloma chemotherapy N/V ppx)   06/06/21 - dexamethasone 53mg ta46m- 40 tabs for 28 day supply - Dr. John DoNarda Rutherfordeased to 20mg on26meekly  06/18/21 - prednisone 10mg tab253m104 tabs for 34 day supply - Dr. Jennifer Dessa Phimg dail58m7 days, 40 mg daily x 7 days, 30 mg daily x 7 days, 20mg daily2m days, 10mg daily 32mdays, 5mg daily x 76mays   07/13/21 - prednisone 10mg tabs - 262mab for 30 day supply - Dr. Jequan Shahin ----Chase Callerily x 139mys, 40 mg daily x 14 days, 30mg daily x 1439ms, 20mg daily there72mr  - Mid Nov 2022  - 1gm solumedrol load x 3 days as outpatient  - 09/25/2021 - 20mg pred per day56mLate December 2022/early January 2023: Start nintedanib - 12/04/21 -prednisone 15mg per day      81m/2023 -   Chief Complaint   Patient presents with   Follow-up    Pt states he stopped taking the OFEV a week ago due to having problems with diarrhea, nausea, and some bleeding still. States since he stopped taking it, he has not had any diarrhea the past 2 days and states the bleeding stopped 2 days ago.     HPI Samuel [name] 65 [address] fo[address]follow-up with his wife.  He tells me that in terms of his multiple myeloma [facility] and since then is followed with Dr. Dorsey.  He also saLorenso CourierRodolfo Pascal [facility]. Pascal's notes fromLetta [place]oc[place] this month 2023.  The general feeling that I get is that they are very nervous to do any form of chemotherapy in him for fear of exacerbating his lung disease.  He is extremely worried about this approach.  There is also trepidation about Cytoxan.  He is really worried about what to do if his myeloma came back.  On the other hand he is also worried about his lungs.  He said he did talk to her known physician call Nathan Greenspan [place] and [place] use 10 L of oxygen with  exercise.  He wants higher dose concentrator for this.  I was willing to prescribe this.  He is willing to even pay for this out-of-pocket.  He is also recommended sudden breathing exercises.  Patient is trying different breathing exercises with the hope his lungs can heal.  He is aware that he might be dealing with progressive issues.  He was on nintedanib for a month and early February 2023 he called saying he was having diarrhea and also bloody stools.  He has now stopped nintedanib and for the last week he has not had a diarrhea.  For the last few days there is no bloody stools.  He does not want to do this drug again.  The side effects were quite bad.  There is no further bleeding.  He also believes his hypertension resolved or improved after stopping nintedanib.  He did have a colonoscopy 5  years ago and since then has not had any problems.  His next colonoscopy might be in another 5 years.  At this point in time he is able to do treadmill exercise on 5 L and walk 30 minutes 1.7 mph.  Nevertheless when we walked him on room air here in office he desaturated quickly.  It seems like his distance to desaturation is gotten worse.  He is worried about both the myeloma and the interstitial lung disease.  We discussed options about getting further opinions.  He says he wants to go to a place with his extreme expertise about this.  We discussed the idea about having to go out of state including [place], [place] [facility] and the [facility].  I do remember a name of Dr. Stefanie Libel who is professor at [facility] who is an Energy manager in myeloma.  I did mention this name to him.  I have also written to him.  I also written to 1 Dr. Judie Grieve at the pulmonary department at the [facility].  Based on the response we will facilitate a referral.  Meanwhile I did tell him that we need to protect his lungs against fibrosis.  He wants to go down on his prednisone to 10 mg/day because of the side effects of weight gain.  I agreed to do this provisionally.  But I also recommended antifibrotic pirfenidone.  We discussed the side effects of nausea anorexia and occasionally diarrhea.  He wants to reflect on this.  He is willing to meet with the pharmacist on this.  I made a referral.   No results found.    PFT  OV 01/24/2022  Subjective:  Patient ID: Samuel [name], male , DOB: Mar 27, 1956 , age 71 y.o. , MRN: [id number] , ADDRESS: [place] [id number] PCP Antony Contras, MD Patient Care Team: Antony Contras, MD as PCP - General (Family Medicine)  This Provider for this visit: Treatment Team:  Attending Provider: Brand Males, MD    01/24/2022 -   Chief Complaint  Patient presents with   Follow-up    PFT performed 12/10/21. Pt recently had a referral with [facility].  Pt  states he has been doing okay since last visit   Drug-induced pneumonitis follow-up interstitial lung disease  HPI Kayl Stogdill Surgeon 66 y.o. -reviewed [facility] notes.  Dr. Judie Grieve also called me last week.  Decided to go with high-dose prednisone because of some groundglass opacities.  Did not want to do concomitant pirfenidone because of potential side effect profile.  Patient has upcoming follow-up appointment Mar 09, 2022 at  [facility] with Dr. Judie Grieve.  Also saw the myeloma specialist.  In case of recurrence some alternatives have been recommended.  He tells me that he is getting more optimistic.  He understands the significance of his disease in the severely but he is doing breathing exercises remaining optimistic.  He is trying an organic diet.  And therefore his positive state of mind is making him feel better.  He does workout on a treadmill at 2.2 mph.  He covers 1.4 miles in 40 minutes.  He uses 8 L of oxygen for this.   Subjective symptom assessment score is documented below in the slightly better from last visit and significantly better compared to October 2022.  Walking desaturation test is improved from 2 months ago but is similar to follow-up 2022  PFT   OV 04/09/2022  Subjective:  Patient ID: Samuel [name], male , DOB: 1956/04/22 , age 55 y.o. , MRN: [id number] , ADDRESS: [place] [id number] PCP Antony Contras, MD Patient Care Team: Antony Contras, MD as PCP - General (Family Medicine)  This Provider for this visit: Treatment Team:  Attending Provider: Brand Males, MD    04/09/2022 -   Chief Complaint  Patient presents with   Follow-up    Pt states he has been doing okay since last visit but states he did get covid 6/6 and then after that he has had a cough.     HPI Samuel [name] 66 y.o. 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  He had a second visit to [facility].  He saw  Dr. Ladon Applebaum for pulmonary.  In terms of his pulmonary status things were deemed to be stable.  He had lung function studies.  His FVC is actually improved to 1.68 L on 03/19/2022 and his DLCO is stable around 10.3./Slightly improved.  Overall his pulmonary function test is stable/trending in the right direction.  He is lost significant amount of weight it is if his improvement could be because of that.  However he was feeling subjectively improved.  According to the [facility] notes pirfenidone is not being recommended because he is stable but he tells me that the decision was left up to him.  Certainly they want him to preserve his lung function and lose more weight.  Today wanted to talk about pirfenidone but in the interim immediately after coming back March 27, 2022 he emailed me saying that he had [place].  He believes he got it at [facility].  He has taken antiviral.  After this he is better but he still having some slightly worse dyspnea on exertion than baseline.  Some slightly more subjective use of oxygen at baseline [baseline exertional pulse ox stable/slightly worse].  More coughing and wheezing than baseline.  In terms of his prednisone therapy for his ILD and this is being tapered currently 7.5 mg/day.  [facility] Dr. Judie Grieve has on a tapering regimen to off but in the middle of this he had increased respiratory symptoms.  Wife is also reporting more sciatica as the prednisone is coming down.  In terms of his hematology he has been seen by [facility] multiple myeloma program and they made specific recommendations that I reviewed.  They are going to be in touch with his local hematologist oncologist Dr. [name] but currently under remission.  Weight loss has been emphasized.  We discussed Ozempic for weight loss.     CT Chest data CT  Chest data  No results found.    PFT    OV 06/06/2022  Subjective:  Patient ID: Samuel [name], male , DOB: [birth year] , age 72 y.o. , MRN: [id number] ,  ADDRESS: [place] [id number] PCP Antony Contras, MD Patient Care Team: Antony Contras, MD as PCP - General (Family Medicine)  This Provider for this visit: Treatment Team:  Attending Provider: Brand Males, MD    06/06/2022 -  incent J Sitts 66 y.o. 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  Pt states that he has not been feeling well since getting Covid May 2023 after going to [facility].     HPI Samuel [name] 66 y.o. -Returns for follow-up.  He presents with his wife Shirlean Mylar. 2 weeks ago tapered off prednisone. Same time also at request we started Pirfenidone he has now completed at 2 pills 3 times daily.  He is going to start 3 pills 3 times daily tomorrow.  He says that overall he is not feeling all that well.  His symptom scores of worsened.  He definitely feels more dyspneic.  He is also having worsening cough although his oxygen requirements at home are not any worse.  He is also feeling more tired.  He is having some back pain.  Had MRI and had intrathecal steroid and after that the back pain is better.  He is wondering if all the symptoms are related to coming off prednisone.  At the same time he also states his myeloma is recurring.  His urine M protein is spiking.  He is going to see Dr. [name] in September 2023 and get started on the new regimen recommended by [facility] that is potentially less toxic to the lungs     His pulmonary function test is better than December 2022 seems worse than May 2023 when he was at [facility].?  Related to prednisone taper.  His walking desaturation test is stable.        SYMPTOM SCALE - ILD 08/09/2021 09/25/2021 218# 12/04/2021 222# 01/24/22 222# 04/09/2022 215# 06/06/2022 211#  Current weight    Treadmill 2.2 mph.  As 1.4 miles over 40 minutes.  Uses 8 L oxygen Post may 2nd visit 03/27/22  -2ndcvoid   O2 use ra ra ra ra 2L eert 2L exert  Shortness of Breath 0 -> 5 scale with 5 being worst (score 6 If unable to do)       At rest 0 0 0 0 0 4  Simple tasks - showers, clothes change, eating, shaving _0 0/5 0 3  Household (dishes, doing bed, laundry) x na _1 Shopping 3 1 1.5 0/_2 Walking level at own pace _3 Walking up Stairs _4 3._5 Total (30-36) Dyspnea Score 14 9 8._6 How bad is your cough? 3 1 0.5  3.4 5  How bad is your fatigue 0 2 0  3   How bad is nausea 0 0 0  0 5  How bad is vomiting?  0 0 0  0 0  How bad is diarrhea? 0 0 0  0 4  How bad is anxiety? _7 0.5 0  How bad is depression _8 0.5 0  Any chronic pain -  if so where and how bad x x x  x      Simple office walk 185 feet x  3 laps goal with forehead probe 07/12/2021  08/09/2021  09/25/2021  12/04/2021  01/24/2022  04/09/2022  06/06/2022   O2 used ra Ra3 _0   Number laps completed 3 3 but did oly _1 attempted but stopped at 2 due to deats All 3 las Stopped at [employer] all 3  Comments about pace slow slow slow slow Avg pace Avg  slow  Resting Pulse Ox/HR 100% and 69/min 100%ad 74 98% and 75 99% RA and59 98% and HR 60 98% and HR 54 98% nand HR 90  Final Pulse Ox/HR 92% and 81/min 93% and 87 91% and 91 86% RA and 85 91% and HR 80 (brief 89%) 88% and HR 80 91% and HR 107  Desaturated </= 88% no no no yes ni  no  Desaturated <= 3% points Yes 8 Yes, 7 pponts Yes, 7 points Yes, 13 poins Yes 7 points Yes, 10 points Yes 7 poin  Got Tachycardic >/= 90/min no no yes no no  uyes  Symptoms at end of test No complaints Mod-severe dyspnea Mild dyspnea No complaints none Seemed ok Mild dyspnea  Miscellaneous comments x Worse? stable ? worse       PFT     Latest Ref Rng & Units 12/10/2021    9:24 AM 10/16/2021    3:56 PM 06/15/2021   10:13 PM  PFT Results  FVC-Pre L 1.49  1.35  1.55   FVC-Predicted Pre % 32  29  33   FVC-Post L   1.45   FVC-Predicted  Post %   31   Pre FEV1/FVC % % 94  92  83   Post FEV1/FCV % %   83   FEV1-Pre L 1.40  1.24  1.28   FEV1-Predicted Pre % 40  36  37   FEV1-Post L   1.20   DLCO uncorrected ml/min/mmHg 8.64  9.99  12.00   DLCO UNC% % 32  37  44   DLCO corrected ml/min/mmHg 9.20  10.04  12.52   DLCO COR %Predicted % 34  37  46   DLVA Predicted % 97  109  118   TLC L   4.18   TLC % Predicted %   59   RV % Predicted %   95        has a past medical history of Asthma, GERD (gastroesophageal reflux disease), High cholesterol, History of blood transfusion, Hypothyroidism, Interstitial lung disease (HCC), Multiple myeloma (HCC), Perennial allergic rhinitis, Pneumonia, Sciatic pain, right, Seasonal allergic rhinitis, and Thyroid disease.   reports that he quit smoking about 39 years ago. His smoking use included cigarettes. He has a 0.30 pack-year smoking history. He has never used smokeless tobacco.  Past Surgical History:  Procedure Laterality Date   BRONCHIAL BIOPSY  06/18/2021   Procedure: BRONCHIAL BIOPSIES;  Surgeon: Collene Gobble, MD;  Location: [facility];  Service: Cardiopulmonary;;   BRONCHIAL BIOPSY  08/28/2021   Procedure: BRONCHIAL BIOPSIES;  Surgeon: Garner Nash, DO;  Location: [place];  Service: Cardiopulmonary;;   BRONCHIAL WASHINGS  06/18/2021   Procedure: BRONCHIAL WASHINGS;  Surgeon: Collene Gobble, MD;  Location: [facility];  Service: Cardiopulmonary;;   BRONCHIAL WASHINGS  08/28/2021   Procedure: BRONCHIAL WASHINGS;  Surgeon: Garner Nash, DO;  Location: [place];  Service: Cardiopulmonary;;  NASAL SINUS SURGERY     NECK SURGERY  1996   VIDEO BRONCHOSCOPY N/A 06/18/2021   Procedure: VIDEO BRONCHOSCOPY WITH FLUORO;  Surgeon: Collene Gobble, MD;  Location: [facility];  Service: Cardiopulmonary;  Laterality: N/A;   VIDEO BRONCHOSCOPY N/A 08/28/2021   Procedure: VIDEO BRONCHOSCOPY WITH FLUORO;  Surgeon: Garner Nash, DO;  Location: [place];  Service:  Cardiopulmonary;  Laterality: N/A;    Allergies  Allergen Reactions   Clarithromycin Other (See Comments)    Hiccups   Ofev [Nintedanib] Other (See Comments)    GI bleeding   Penicillins Other (See Comments)    Child hood    Immunization History  Administered Date(s) Administered   Fluad Quad(high Dose 65+) 08/09/2021   Influenza Split 10/27/2017   Influenza, High Dose Seasonal PF 12/01/2017, 11/29/2019, 12/19/2020, 12/24/2021   Influenza, Quadrivalent, Recombinant, Inj, Pf 07/28/2019, 08/11/2020   Influenza-Unspecified 11/26/2011, 07/21/2017   PFIZER(Purple Top)SARS-COV-2 Vaccination 12/27/2019, 01/24/2020, 09/21/2020   Tdap 08/20/2005, 09/07/2015   Zoster, Live 11/06/2017, 05/07/2018    Family History  Problem Relation Age of Onset   Hypertension Mother    Allergies Mother    Allergies Father    CAD Father 24   Breast cancer Paternal Grandmother    Hypertension Other    Heart disease Other      Current Outpatient Medications:    acyclovir (ZOVIRAX) 400 MG tablet, Take 1 tablet (400 mg total) by mouth 2 (two) times daily., Disp: 60 tablet, Rfl: 3   albuterol (PROVENTIL) (2.5 MG/3ML) 0.083% nebulizer solution, Take 3 mLs (2.5 mg total) by nebulization every 6 (six) hours as needed for wheezing or shortness of breath., Disp: 120 mL, Rfl: 12   albuterol (VENTOLIN HFA) 108 (90 Base) MCG/ACT inhaler, Inhale 2 puffs into the lungs every 6 hours as needed for wheezing or shortness of breath., Disp: 8.5 g, Rfl: 1   ALPRAZolam (XANAX) 0.5 MG tablet, Take 0.5 mg by mouth at bedtime., Disp: , Rfl:    ASSESS FULL RANGE PEAK METER DEVI, as directed., Disp: , Rfl:    bisoprolol (ZEBETA) 5 MG tablet, Take 1 tablet (5 mg total) by mouth daily., Disp: 30 tablet, Rfl: 2   cholecalciferol (VITAMIN D) 1000 UNITS tablet, Take 3,000 Units by mouth daily., Disp: , Rfl:    EPINEPHrine (EPI-PEN) 0.3 mg/0.3 mL DEVI, Inject 0.3 mg into the muscle as needed., Disp: , Rfl:    ezetimibe-simvastatin  (VYTORIN) 10-40 MG per tablet, Take 1 tablet by mouth at bedtime., Disp: , Rfl:    fluticasone-salmeterol (ADVAIR HFA) 230-21 MCG/ACT inhaler, Inhale 2 puffs into the lungs 2 (two) times daily. Brand name only, Disp: 1 each, Rfl: 12   furosemide (LASIX) 20 MG tablet, TAKE ONE TABLET BY MOUTH AS NEEDED FOR FLUID OR EDEMA, Disp: 14 tablet, Rfl: 1   ketoconazole (NIZORAL) 2 % cream, SMARTSIG:1 Application Topical 1 to 2 Times Daily, Disp: , Rfl:    LORazepam (ATIVAN) 0.5 MG tablet, 1-2 tabs 30 - 60 min prior to MRI. Do not drive with this medicine., Disp: 4 tablet, Rfl: 0   montelukast (SINGULAIR) 10 MG tablet, Take 10 mg by mouth at bedtime., Disp: , Rfl:    Multiple Vitamin (MULTIVITAMIN) tablet, Take 1 tablet by mouth daily., Disp: , Rfl:    omeprazole (PRILOSEC) 40 MG capsule, Take 1 capsule by mouth daily., Disp: , Rfl:    OXYGEN, Inhale 2 L into the lungs continuous., Disp: , Rfl:    Pirfenidone 267 MG TABS, Take  3 tablets (801 mg total) by mouth 3 (three) times daily with meals., Disp: 270 tablet, Rfl: 4   predniSONE (DELTASONE) 5 MG tablet, Once finish with 20m for 2 days, take 7.574m Disp: 30 tablet, Rfl: 2   sildenafil (REVATIO) 20 MG tablet, Take 20 mg by mouth daily as needed (ED)., Disp: , Rfl:    Simethicone (SIMETHICONE ULTRA STRENGTH) 180 MG CAPS, Take 1 capsule (180 mg total) by mouth 3 (three) times daily as needed., Disp: 90 capsule, Rfl: 0   sodium chloride (OCEAN) 0.65 % SOLN nasal spray, Place 1 spray into both nostrils as needed for congestion., Disp: , Rfl:    tamsulosin (FLOMAX) 0.4 MG CAPS capsule, Take 0.4 mg by mouth daily., Disp: , Rfl:    thyroid (ARMOUR) 90 MG tablet, Take 1 tablet by mouth daily., Disp: , Rfl:    triamcinolone cream (KENALOG) 0.1 %, Apply 1 application topically daily as needed (sun burn itch)., Disp: , Rfl:       Objective:   Vitals:   06/06/22 1432  BP: 126/80  Pulse: 94  SpO2: 96%  Weight: 211 lb (95.7 kg)  Height: _0  (1.778 m)     Estimated body mass index is 30.28 kg/m as calculated from the following:   Height as of this encounter: _1  (1.778 m).   Weight as of this encounter: 211 lb (95.7 kg).  _2 @  Filed Weights   06/06/22 1432  Weight: 211 lb (95.7 kg)     Physical Exam    General: No distress. Looks less heavy but some flast affecy Neuro: Alert and Oriented x 3. GCS 15. Speech normal Psych: Pleasant Resp:  Barrel Chest - no.  Wheeze - no, Crackles - no, No overt respiratory distress CVS: Normal heart sounds. Murmurs - no Ext: Stigmata of Connective Tissue Disease - nio HEENT: Normal upper airway. PEERL +. No post nasal drip        Assessment:       ICD-10-CM   1. ILD (interstitial lung disease) (HCMontpelier J84.9          Plan:     Patient Instructions  ILD (interstitial lung disease) (HCPine BeachDrug-induced pneumonitis Chronic respiratory failure with hypoxia (HCThe Galena TerritoryEncounter for medication counseling   Currently symptoms of back pain and fatigue and increased shortness of breath and cough.  Pulmonary function test is still better than late last year 2022 but [facility] Unclear if these are all related to lack of prednisone  OR myeloma returning  OR  got a mild asthma/ILD flareup  Walk test itsef is stable over time  Noted that you reported that the myeloma is recurring  Noted that you are on pirfenidone for 2 weeks and tolerating it well    Plan  -Albuterol as needed -Empiric short course of prednisone please take  - Take prednisone 4040mnce daily x 3 days, then 11m103mce daily x 3 days, then 20mg15me daily x 3 days, then prednisone 10mg 59m daily  x 3 days and stop  -Continue pirfenidone per schedule increasing it to 3 pills 3 times daily tomorrow and then staying there  -Take after meals  -Apply sunscreen  -Take at least 5 to 6 hours apart  - o2 for goal pulse ox > 88%  -Check liver function test today or within the next  4 weeks [last checked 05/17/2022]  -Function test monitoring needed every month for 6 months and then  every 3 months after that while on pirfenidone   Obesity (BMI 30.0-34.9)  Congratulations on ongoing weight loss that is intentional  Plan -get myfitness pal app and slowly lose weight - glad there is weight loss  - some people believe that vegeterian diets are best Marlou Sa Ornish, Tawni Millers) - talk to PCP Antony Contras, MD about [facility]  Myeloma    Recurrign per history  Plan  - per [facility] and Dr Burgess Estelle Spiro/dlco in 8  weeks Flordia Kassem or APP in  9 weeks  - symptoms/wak test at followu     SIGNATURE    Dr. Brand Males, M.D., F.C.C.P,  Pulmonary and Critical Care Medicine Staff Physician, [place] Director - Interstitial Lung Disease  Program  Pulmonary [place] at [place], [place], [address]  Pager: [phone], If no answer or between  15:00h - 7:00h: call [phone] Telephone: [phone]  3:05 PM 06/06/2022

## 2022-06-07 ENCOUNTER — Other Ambulatory Visit: Payer: Self-pay

## 2022-06-12 ENCOUNTER — Other Ambulatory Visit (HOSPITAL_COMMUNITY): Payer: Self-pay

## 2022-06-19 ENCOUNTER — Other Ambulatory Visit (HOSPITAL_COMMUNITY): Payer: Self-pay

## 2022-06-19 LAB — PULMONARY FUNCTION TEST
DL/VA % pred: 128 %
DL/VA: 5.31 ml/min/mmHg/L
DLCO cor % pred: 51 %
DLCO cor: 13.77 ml/min/mmHg
DLCO unc % pred: 49 %
DLCO unc: 13.19 ml/min/mmHg
FEF 25-75 Pre: 1.79 L/sec
FEF2575-%Pred-Pre: 65 %
FEV1-%Pred-Pre: 36 %
FEV1-Pre: 1.26 L
FEV1FVC-%Pred-Pre: 118 %
FEV6-%Pred-Pre: 32 %
FEV6-Pre: 1.43 L
FEV6FVC-%Pred-Pre: 105 %
FVC-%Pred-Pre: 30 %
FVC-Pre: 1.43 L
Pre FEV1/FVC ratio: 88 %
Pre FEV6/FVC Ratio: 100 %

## 2022-06-21 ENCOUNTER — Other Ambulatory Visit: Payer: Self-pay | Admitting: *Deleted

## 2022-06-21 DIAGNOSIS — C9000 Multiple myeloma not having achieved remission: Secondary | ICD-10-CM

## 2022-06-21 DIAGNOSIS — D472 Monoclonal gammopathy: Secondary | ICD-10-CM

## 2022-06-25 ENCOUNTER — Telehealth: Payer: Self-pay | Admitting: Hematology and Oncology

## 2022-06-25 ENCOUNTER — Inpatient Hospital Stay: Payer: Medicare Other

## 2022-06-25 ENCOUNTER — Inpatient Hospital Stay: Payer: Medicare Other | Admitting: Hematology and Oncology

## 2022-06-25 NOTE — Telephone Encounter (Signed)
Scheduled per 9/5 in basket, pt has been called and confirmed  

## 2022-07-01 ENCOUNTER — Telehealth: Payer: Self-pay | Admitting: *Deleted

## 2022-07-01 NOTE — Telephone Encounter (Signed)
Received call from patient stating he wants to adjust his appts on 07/03/22 He states he is not feeling as well these days. He started on Pirfenidone per Dr. Gerome Apley x 4 weeks. He also played quite a bit of golf over Labor day weekend. He wonders if he over did it as he experienced quite a bit of dizziness on 06/24/22 and had to stop playing golf and get a ride home. He drank quite a bit of fluids and felt better after that-so he thinks he might have been dehydrated that day. He also experienced a rash on the underside of one of his arms and an Urgent provider told him it was Shingles. He had no blisters. It was only on the underside of his forearm.   He was started ob Acyclovir 5 x a day and did not feel well on that so he stopped this. Because of all of the above pt wants to just have labs and provider visit and not get his Zometa this time. He states he can get that on another visit Pt will also check in with Dr. [name] about possible side effects of the Pirfenidone .   His appt here is on 07/03/22 -labs and clinic visit with Dede Query, PA   Cancelled Zometa for 07/03/22 per pt request

## 2022-07-03 ENCOUNTER — Ambulatory Visit: Payer: BLUE CROSS/BLUE SHIELD

## 2022-07-03 ENCOUNTER — Inpatient Hospital Stay (HOSPITAL_BASED_OUTPATIENT_CLINIC_OR_DEPARTMENT_OTHER): Payer: Medicare Other | Admitting: Physician Assistant

## 2022-07-03 ENCOUNTER — Other Ambulatory Visit: Payer: BLUE CROSS/BLUE SHIELD

## 2022-07-03 ENCOUNTER — Ambulatory Visit: Payer: BLUE CROSS/BLUE SHIELD | Admitting: Hematology and Oncology

## 2022-07-03 ENCOUNTER — Other Ambulatory Visit: Payer: Self-pay

## 2022-07-03 ENCOUNTER — Inpatient Hospital Stay: Payer: Medicare Other | Attending: Hematology and Oncology

## 2022-07-03 ENCOUNTER — Ambulatory Visit: Payer: Medicare Other

## 2022-07-03 VITALS — BP 154/101 | HR 79 | Temp 97.9°F | Resp 15 | Wt 211.8 lb

## 2022-07-03 DIAGNOSIS — Z79624 Long term (current) use of inhibitors of nucleotide synthesis: Secondary | ICD-10-CM | POA: Insufficient documentation

## 2022-07-03 DIAGNOSIS — C9000 Multiple myeloma not having achieved remission: Secondary | ICD-10-CM | POA: Insufficient documentation

## 2022-07-03 DIAGNOSIS — Z79899 Other long term (current) drug therapy: Secondary | ICD-10-CM | POA: Diagnosis not present

## 2022-07-03 DIAGNOSIS — E871 Hypo-osmolality and hyponatremia: Secondary | ICD-10-CM | POA: Diagnosis not present

## 2022-07-03 DIAGNOSIS — R42 Dizziness and giddiness: Secondary | ICD-10-CM | POA: Insufficient documentation

## 2022-07-03 DIAGNOSIS — Z87891 Personal history of nicotine dependence: Secondary | ICD-10-CM | POA: Diagnosis not present

## 2022-07-03 LAB — CMP (CANCER CENTER ONLY)
ALT: 22 U/L (ref 0–44)
AST: 25 U/L (ref 15–41)
Albumin: 4.1 g/dL (ref 3.5–5.0)
Alkaline Phosphatase: 37 U/L — ABNORMAL LOW (ref 38–126)
Anion gap: 8 (ref 5–15)
BUN: 19 mg/dL (ref 8–23)
CO2: 26 mmol/L (ref 22–32)
Calcium: 9.1 mg/dL (ref 8.9–10.3)
Chloride: 95 mmol/L — ABNORMAL LOW (ref 98–111)
Creatinine: 0.88 mg/dL (ref 0.61–1.24)
GFR, Estimated: >60 mL/min (ref >60)
Glucose, Bld: 90 mg/dL (ref 70–99)
Potassium: 4.3 mmol/L (ref 3.5–5.1)
Sodium: 129 mmol/L — ABNORMAL LOW (ref 135–145)
Total Bilirubin: 0.4 mg/dL (ref 0.3–1.2)
Total Protein: 7.3 g/dL (ref 6.5–8.1)

## 2022-07-03 LAB — CBC WITH DIFFERENTIAL (CANCER CENTER ONLY)
Abs Immature Granulocytes: 0.02 10*3/uL (ref 0.00–0.07)
Basophils Absolute: 0 10*3/uL (ref 0.0–0.1)
Basophils Relative: 1 %
Eosinophils Absolute: 0.3 10*3/uL (ref 0.0–0.5)
Eosinophils Relative: 4 %
HCT: 40.2 % (ref 39.0–52.0)
Hemoglobin: 14.1 g/dL (ref 13.0–17.0)
Immature Granulocytes: 0 %
Lymphocytes Relative: 17 %
Lymphs Abs: 1.1 10*3/uL (ref 0.7–4.0)
MCH: 31.8 pg (ref 26.0–34.0)
MCHC: 35.1 g/dL (ref 30.0–36.0)
MCV: 90.7 fL (ref 80.0–100.0)
Monocytes Absolute: 0.5 10*3/uL (ref 0.1–1.0)
Monocytes Relative: 7 %
Neutro Abs: 4.6 10*3/uL (ref 1.7–7.7)
Neutrophils Relative %: 71 %
Platelet Count: 205 10*3/uL (ref 150–400)
RBC: 4.43 MIL/uL (ref 4.22–5.81)
RDW: 12.3 % (ref 11.5–15.5)
WBC Count: 6.5 10*3/uL (ref 4.0–10.5)
nRBC: 0 % (ref 0.0–0.2)

## 2022-07-03 LAB — LACTATE DEHYDROGENASE: LDH: 219 U/L — ABNORMAL HIGH (ref 98–192)

## 2022-07-04 LAB — KAPPA/LAMBDA LIGHT CHAINS
Kappa free light chain: 23.5 mg/L — ABNORMAL HIGH (ref 3.3–19.4)
Kappa, lambda light chain ratio: 2.4 — ABNORMAL HIGH (ref 0.26–1.65)
Lambda free light chains: 9.8 mg/L (ref 5.7–26.3)

## 2022-07-05 ENCOUNTER — Encounter: Payer: Self-pay | Admitting: Hematology and Oncology

## 2022-07-05 ENCOUNTER — Telehealth: Payer: Self-pay | Admitting: Hematology and Oncology

## 2022-07-05 NOTE — Telephone Encounter (Signed)
Per 9/13 los called and left message for pt about appointment

## 2022-07-05 NOTE — Progress Notes (Signed)
[place] [phone]   [phone]  PROGRESS NOTE  Patient Care Team: Antony Contras, MD as PCP - General (Family Medicine)  Hematological/Oncological History # IgG Kappa Multiple Myeloma 02/02/2021:  Presented to [place] ED due to right sided flank tenderness with bruising. CT abdomen/pelvis: Multiple small lytic lesions in the thoracolumbar spine and bilateral pelvis -SPEP: IgG 2,082 (H), M Protein 1.8 (H). IFE shows IgG monoclonal protein with kappa light chain specificity.  -LDH 169, CBC normal, CMP normal except for sodium 131 (L), Chloride 96 (L).   02/14/2021: Establish care with Dede Query PA-C 02/22/2021: bone marrow biopsy confirms the diagnosis of Multiple Myeloma with a monoclonal plasma cell population.  03/16/2021: Cycle 1 Day 1 of VRd chemotherapy  04/06/2021: Cycle 2 Day 1 of VRd chemotherapy  04/27/2021: Cycle 3 Day 1 of VRd chemotherapy  05/18/2021: Cycle 4 Day 1 of VRd chemotherapy  06/01/2021: drop dexamethasone to 65m PO weekly and start lasix due to shortness of breath.  06/15/2021: Desaturation to 87% on ambulation. HELD velcade today and sent to ED for evaluation.  06/22/2021: Findings consistent with drug reaction the lungs with eosinophils on BAL.  Given these findings we will definitely hold [name] and plan to avoid pomalidomide 06/29/2021: Cycle 1 Day 1 CyBorD chemotherapy 07/05/2021: HOLD chemotherapy given worsening lung function  Interval History:  [name] 66y.o. male with medical history significant for IgG Kappa multiple myeloma who presents for a follow up visit. The patient's last visit was on 05/17/2022 with Dr. DLorenso Courier He is accompanied by his wife for this visit.   Mr. TGoyerreports that 06/24/2022, he became extremely dizzy when playing golf. He required his friends to help him get back home. He drank fluids afterwards and felt better so wonder if he was dehydrated. Since then, he hasn't had repeat episodes but has  felt extremely tired. He is concerned it could be from new medication, Pirfenidone, that was started by his pulmonologist.   He reports his appetite and weight are stable. He denies nausea, vomiting or abdominal pain. His bowel habits are regular without recurrent episodes of diarrhea or constipation. He denies easy bruising or signs of bleeding. He continues to have shortness of breath, mainly with exertion. He denies fevers, chills, sweat, chest pain or cough. He has no other complaints. Rest of the 10 point ROS is listed below.  MEDICAL HISTORY:  Past Medical History:  Diagnosis Date   Asthma    GERD (gastroesophageal reflux disease)    High cholesterol    under control.    History of blood transfusion    Hypothyroidism    Interstitial lung disease ([facility]    Multiple myeloma ([facility])    Perennial allergic rhinitis    Pneumonia    Sciatic pain, right    Seasonal allergic rhinitis    Thyroid disease     SURGICAL HISTORY: Past Surgical History:  Procedure Laterality Date   BRONCHIAL BIOPSY  06/18/2021   Procedure: BRONCHIAL BIOPSIES;  Surgeon: BCollene Gobble MD;  Location: [facility];  Service: Cardiopulmonary;;   BRONCHIAL BIOPSY  08/28/2021   Procedure: BRONCHIAL BIOPSIES;  Surgeon: IGarner Nash DO;  [place] [facility]  Service: Cardiopulmonary;;   BRONCHIAL WASHINGS  06/18/2021   Procedure: BRONCHIAL WASHINGS;  Surgeon: BCollene Gobble MD;  Location: [facility];  Service: Cardiopulmonary;;   BRONCHIAL WASHINGS  08/28/2021   Procedure: BRONCHIAL WASHINGS;  Surgeon: IGarner Nash DO;  [place] [facility]  Service: Cardiopulmonary;;  NASAL SINUS SURGERY     NECK SURGERY  1996   VIDEO BRONCHOSCOPY N/A 06/18/2021   Procedure: VIDEO BRONCHOSCOPY WITH FLUORO;  Surgeon: Collene Gobble, MD;  Location: [facility];  Service: Cardiopulmonary;  Laterality: N/A;   VIDEO BRONCHOSCOPY N/A 08/28/2021   Procedure: VIDEO BRONCHOSCOPY WITH FLUORO;  Surgeon: Garner Nash,  DO;  Location: [facility];  Service: Cardiopulmonary;  Laterality: N/A;    SOCIAL HISTORY: Social History   Socioeconomic History   Marital status: Married    Spouse name: Not on file   Number of children: 3   Years of education: Not on file   Highest education level: Not on file  Occupational History   Not on file  Tobacco Use   Smoking status: Former    Packs/day: 0.10    Years: 3.00    Total pack years: 0.30    Types: Cigarettes    Quit date: 10/21/1982    Years since quitting: 39.7   Smokeless tobacco: Never  Vaping Use   Vaping Use: Never used  Substance and Sexual Activity   Alcohol use: Not Currently    Comment: 1 drink daily   Drug use: No   Sexual activity: Not on file  Other Topics Concern   Not on file  Social History Narrative   Not on file   Social Determinants of Health   Financial Resource Strain: Not on file  Food Insecurity: Not on file  Transportation Needs: Not on file  Physical Activity: Not on file  Stress: Not on file  Social Connections: Not on file  Intimate Partner Violence: Not on file    FAMILY HISTORY: Family History  Problem Relation Age of Onset   Hypertension Mother    Allergies Mother    Allergies Father    CAD Father 18   Breast cancer Paternal Grandmother    Hypertension Other    Heart disease Other     ALLERGIES:  is allergic to clarithromycin, ofev [nintedanib], and penicillins.  MEDICATIONS:  Current Outpatient Medications  Medication Sig Dispense Refill   acyclovir (ZOVIRAX) 400 MG tablet Take 1 tablet (400 mg total) by mouth 2 (two) times daily. 60 tablet 3   albuterol (PROVENTIL) (2.5 MG/3ML) 0.083% nebulizer solution Take 3 mLs (2.5 mg total) by nebulization every 6 (six) hours as needed for wheezing or shortness of breath. 120 mL 12   albuterol (VENTOLIN HFA) 108 (90 Base) MCG/ACT inhaler Inhale 2 puffs into the lungs every 6 hours as needed for wheezing or shortness of breath. 8.5 g 1   ALPRAZolam (XANAX) 0.5  MG tablet Take 0.5 mg by mouth at bedtime.     ASSESS FULL RANGE PEAK METER DEVI as directed.     bisoprolol (ZEBETA) 5 MG tablet Take 1 tablet (5 mg total) by mouth daily. 30 tablet 2   cholecalciferol (VITAMIN D) 1000 UNITS tablet Take 3,000 Units by mouth daily.     EPINEPHrine (EPI-PEN) 0.3 mg/0.3 mL DEVI Inject 0.3 mg into the muscle as needed.     ezetimibe-simvastatin (VYTORIN) 10-40 MG per tablet Take 1 tablet by mouth at bedtime.     fluticasone-salmeterol (ADVAIR HFA) 230-21 MCG/ACT inhaler Inhale 2 puffs into the lungs 2 (two) times daily. Brand name only 1 each 12   furosemide (LASIX) 20 MG tablet TAKE ONE TABLET BY MOUTH AS NEEDED FOR FLUID OR EDEMA 14 tablet 1   ketoconazole (NIZORAL) 2 % cream SMARTSIG:1 Application Topical 1 to 2 Times Daily  montelukast (SINGULAIR) 10 MG tablet Take 10 mg by mouth at bedtime.     Multiple Vitamin (MULTIVITAMIN) tablet Take 1 tablet by mouth daily.     omeprazole (PRILOSEC) 40 MG capsule Take 1 capsule by mouth daily.     OXYGEN Inhale 2 L into the lungs continuous. As needed     Pirfenidone 267 MG TABS Take 3 tablets (801 mg total) by mouth 3 (three) times daily with meals. 270 tablet 4   sildenafil (REVATIO) 20 MG tablet Take 20 mg by mouth daily as needed (ED).     Simethicone (SIMETHICONE ULTRA STRENGTH) 180 MG CAPS Take 1 capsule (180 mg total) by mouth 3 (three) times daily as needed. 90 capsule 0   sodium chloride (OCEAN) 0.65 % SOLN nasal spray Place 1 spray into both nostrils as needed for congestion.     tamsulosin (FLOMAX) 0.4 MG CAPS capsule Take 0.4 mg by mouth daily.     thyroid (ARMOUR) 90 MG tablet Take 1 tablet by mouth daily.     triamcinolone cream (KENALOG) 0.1 % Apply 1 application topically daily as needed (sun burn itch).     LORazepam (ATIVAN) 0.5 MG tablet 1-2 tabs 30 - 60 min prior to MRI. Do not drive with this medicine. (Patient not taking: Reported on 07/03/2022) 4 tablet 0   predniSONE (DELTASONE) 5 MG tablet Once  finish with 38m for 2 days, take 7.585m(Patient not taking: Reported on 07/03/2022) 30 tablet 2   No current facility-administered medications for this visit.    REVIEW OF SYSTEMS:   Constitutional: ( - ) fevers, ( - )  chills , ( - ) night sweats Eyes: ( - ) blurriness of vision, ( - ) double vision, ( - ) watery eyes Ears, nose, mouth, throat, and face: ( - ) mucositis, ( - ) sore throat Respiratory: ( - ) cough, ( + ) dyspnea, ( - ) wheezes Cardiovascular: ( - ) palpitation, ( - ) chest discomfort, ( - ) lower extremity swelling Gastrointestinal:  ( - ) nausea, ( - ) heartburn, ( - ) change in bowel habits Skin: ( - ) abnormal skin rashes Lymphatics: ( - ) new lymphadenopathy, ( - ) easy bruising Neurological: ( - ) numbness, ( - ) tingling, ( - ) new weaknesses Behavioral/Psych: ( - ) mood change, ( - ) new changes  All other systems were reviewed with the patient and are negative.  PHYSICAL EXAMINATION: ECOG PERFORMANCE STATUS: 1 - Symptomatic but completely ambulatory  Vitals:   07/03/22 1357  BP: (!) 154/101  Pulse: 79  Resp: 15  Temp: 97.9 F (36.6 C)  SpO2: 95%      Filed Weights   07/03/22 1357  Weight: 211 lb 12.8 oz (96.1 kg)     GENERAL: well appearing middle aged Caucasian male alert, no distress and comfortable SKIN: skin color, texture, turgor are normal, no rashes or significant lesions EYES: conjunctiva are pink and non-injected, sclera clear LUNGS: Decreased breath sounds with no crackles or rails.  No wheezing noted. HEART: regular rate & rhythm and no murmurs and no lower extremity edema Musculoskeletal: no cyanosis of digits and no clubbing  PSYCH: alert & oriented x 3, fluent speech NEURO: no focal motor/sensory deficits  LABORATORY DATA:  I have reviewed the data as listed    Latest Ref Rng & Units 07/03/2022    1:31 PM 05/17/2022    2:43 PM 04/17/2022    2:42 PM  CBC  WBC  4.0 - 10.5 K/uL 6.5  7.5  10.7   Hemoglobin 13.0 - 17.0 g/dL 14.1   13.2  13.8   Hematocrit 39.0 - 52.0 % 40.2  38.0  39.8   Platelets 150 - 400 K/uL 205  240  209        Latest Ref Rng & Units 07/03/2022    1:31 PM 05/17/2022    2:43 PM 04/17/2022    2:42 PM  CMP  Glucose 70 - 99 mg/dL 90  101  147   BUN 8 - 23 mg/dL _0 Creatinine 0.61 - 1.24 mg/dL 0.88  0.83  0.92   Sodium 135 - 145 mmol/L 129  129  132   Potassium 3.5 - 5.1 mmol/L 4.3  4.7  4.4   Chloride 98 - 111 mmol/L 95  97  99   CO2 22 - 32 mmol/L _1 Calcium 8.9 - 10.3 mg/dL 9.1  8.9  9.1   Total Protein 6.5 - 8.1 g/dL 7.3  7.3  6.9   Total Bilirubin 0.3 - 1.2 mg/dL 0.4  0.4  0.3   Alkaline Phos 38 - 126 U/L 37  36  34   AST 15 - 41 U/L _2 ALT 0 - 44 U/L _3 Lab Results  Component Value Date   MPROTEIN 0.5 (H) 05/17/2022   MPROTEIN 0.4 (H) 04/17/2022   MPROTEIN 0.2 (H) 01/31/2022   Lab Results  Component Value Date   KPAFRELGTCHN 23.5 (H) 07/03/2022   KPAFRELGTCHN 27.9 (H) 05/17/2022   KPAFRELGTCHN 17.0 04/17/2022   LAMBDASER 9.8 07/03/2022   LAMBDASER 9.7 05/17/2022   LAMBDASER 7.2 04/17/2022   KAPLAMBRATIO 2.40 (H) 07/03/2022   KAPLAMBRATIO 2.88 (H) 05/17/2022   KAPLAMBRATIO 2.36 (H) 04/17/2022    RADIOGRAPHIC STUDIES: I have personally reviewed the radiological images as listed and agreed with the findings in the report: lytic lesions in the hip bones bilaterally.  No results found.   ASSESSMENT & PLAN Samuel [name] 66 y.o. male with medical history significant for IgG Kappa multiple myeloma who presents for a follow up visit.   After review of the labs, review of the records, and discussion with the patient the findings are most consistent with an IgG kappa multiple myeloma.  The patient has 2 lytic lesions within the pelvic bones (more noted in spine on CT scan) as well as 10% plasma cells within the bone marrow biopsy.  This combined with his serological findings confirm the diagnosis of multiple myeloma.  The initial  treatment of choice for this patient's multiple myeloma was VRd. This will consist of bortezomib 1.70m/m2 on days 1, 8, 15, dexamethasone 475mon days 1,8, and 15, and revlimid 254mO daily days 1-14. Cycles will consist of 21 days. We will use this regimen to stabilize the patient's myeloma, then make a referral to a BMT center of his choosing for consideration of a bone marrow transplant once he has been noted to have a good response (VGPR or better). Zometa was started after dental clearance was obtained. This will be continued x 2 years.   Unfortunately had a drug reaction to [name] and was admitted to the hospital from 06/15/2021 until 06/18/2021.  The patient underwent a B AL which clearly showed evidence of eosinophils.  This is consistent with a drug reaction most likely caused by the patient's [name].  Discussed the case with pulmonology who recommended that we not rechallenge for attempt other drugs in the same class such as pomalidomide.  Given the patient's excellent response to [facility] therapy might preference would be to continue that.  As such I would recommend we proceed with CyBorD chemotherapy.  Daratumumab-based therapy could have been considered, but is often paired with an immunologic.  Additionally I would like to preserve those for additional lines of therapy if necessary.  Therefore we will proceed with CyBorD chemotherapy have the patient referred to transplant.  R-IPSS score: Stage 2. PFS of 42 months  # IgG Kappa Multiple Myeloma (t11;14, standard risk)  --diagnostic criteria was met with monoclonal plasma cells in the bone marrow and lytic lesions on the bone --recommend proceeding with VRd chemotherapy as noted above --patient is young and healthy enough for consideration of BMT, though he is borderline with his age. Will consider referral pending response to treatment.  -- Cycle 1 Day 1 started on 03/16/2021 --decreased dexamethasone to 44m PO weekly due to fluid  overload. Also started lasix 261mPO (changed on 06/01/2021) --plan to HOLD revlimid moving forward after evidence of drug reaction in the lungs. --started CyBorD chemotherapy on 06/29/2021 --patient has reached a VGPR.  Plan: --Labs today were reviewed. No cytopenias, renal dysfunction or hypercalcemia. MM panel and sFLC pending. --If M protein increases above 0.5 g/dL, recommend to resume therapy. --[facility] recommended Dara/[name]/dexamethasone which Dr. DoLorenso Couriergrees is a reasonable regimen. Patient was aware of the risks and benefits regarding possible worsening of the lung function. --RTC in 4 weeks with labs unless pending labs require intervention.  #Fatigue: --Labs from today don't show clear etiology.  --Patient has chronic hyponatremia but doesn't explain recent symptoms. --Recommend to follow up with PCP to evaluate for other underlying causes for fatigue such as thyroid dysfunction, vitamin deficiencies, etc.   #Drug Reaction in the Lung --confirmed with increased eosinophils on BAL during admission for shortness of breath --on  pirfenidone and steroid taper managed by pulmonologist, Dr. RaChase Caller #Supportive Care -- chemotherapy education complete --zometa therapy started on 04/05/2021 (4 mg IV q 3 months), dose held today per patient's request. --ASA 8166mO daily for thromboprophylaxis on revlimid -- zofran 8mg74mH PRN and compazine 10mg35mq6H for nausea -- acyclovir 400mg 38mID for VCZ prophylaxis -- no pain medication required at this time.   No orders of the defined types were placed in this encounter.  All questions were answered. The patient knows to call the clinic with any problems, questions or concerns.  I have spent a total of 25 minutes minutes of face-to-face and non-face-to-face time, preparing to see the patien[place]ubbockically appropriate examination, counseling and educating the patient,  documenting clinical information in the electronic  health record,  and care coordination.   Sharena Dibenedetto Dede QueryDept of Hematology and OncoloSandia HeightssleyRenaissance Surgery Center LLC: [phone]/2023 8:37 AM

## 2022-07-08 ENCOUNTER — Telehealth: Payer: Self-pay | Admitting: Family Medicine

## 2022-07-08 ENCOUNTER — Other Ambulatory Visit: Payer: Self-pay

## 2022-07-08 ENCOUNTER — Other Ambulatory Visit (HOSPITAL_COMMUNITY): Payer: Self-pay

## 2022-07-08 DIAGNOSIS — M5416 Radiculopathy, lumbar region: Secondary | ICD-10-CM

## 2022-07-08 NOTE — Telephone Encounter (Signed)
Patient called asking if Dr Georgina Snell would be able to order another epidural for him?  Please advise.

## 2022-07-10 ENCOUNTER — Other Ambulatory Visit (HOSPITAL_COMMUNITY): Payer: Self-pay

## 2022-07-10 LAB — MULTIPLE MYELOMA PANEL, SERUM
Albumin SerPl Elph-Mcnc: 3.6 g/dL (ref 2.9–4.4)
Albumin/Glob SerPl: 1.2 (ref 0.7–1.7)
Alpha 1: 0.3 g/dL (ref 0.0–0.4)
Alpha2 Glob SerPl Elph-Mcnc: 0.8 g/dL (ref 0.4–1.0)
B-Globulin SerPl Elph-Mcnc: 0.9 g/dL (ref 0.7–1.3)
Gamma Glob SerPl Elph-Mcnc: 1.1 g/dL (ref 0.4–1.8)
Globulin, Total: 3.2 g/dL (ref 2.2–3.9)
IgA: 126 mg/dL (ref 61–437)
IgG (Immunoglobin G), Serum: 1144 mg/dL (ref 603–1613)
IgM (Immunoglobulin M), Srm: 56 mg/dL (ref 20–172)
M Protein SerPl Elph-Mcnc: 0.3 g/dL — ABNORMAL HIGH
Total Protein ELP: 6.8 g/dL (ref 6.0–8.5)

## 2022-07-11 ENCOUNTER — Telehealth: Payer: Self-pay | Admitting: *Deleted

## 2022-07-11 NOTE — Telephone Encounter (Signed)
[name] patient regarding his recent lab results.  Spoke with him and advised his myeloma #'s are stable. Pt is pleased with these results. Pt states he had his 2nd cataract surgery this morning and is doing well with that. No other questions or concerns. He is aware of his appt next month.

## 2022-07-11 NOTE — Telephone Encounter (Signed)
-----   Message from Orson Slick, MD sent at 07/11/2022 10:18 AM EDT ----- Regarding: P2 Please let Mr. Wonders know his M protein is stable at 0.3 and Kappa chains are also steady.   ----- Message ----- From: Buel Ream, Lab In [place] Sent: 07/03/2022   1:56 PM EDT To: Orson Slick, MD

## 2022-07-16 ENCOUNTER — Encounter: Payer: Self-pay | Admitting: Hematology and Oncology

## 2022-07-19 ENCOUNTER — Other Ambulatory Visit (HOSPITAL_COMMUNITY): Payer: Self-pay

## 2022-07-24 ENCOUNTER — Ambulatory Visit
Admission: RE | Admit: 2022-07-24 | Discharge: 2022-07-24 | Disposition: A | Discharge status: Home / Self Care | Payer: Medicare Other | Source: Ambulatory Visit | Attending: Family Medicine | Admitting: Family Medicine

## 2022-07-24 DIAGNOSIS — M5416 Radiculopathy, lumbar region: Secondary | ICD-10-CM

## 2022-07-24 MED ORDER — METHYLPREDNISOLONE ACETATE 40 MG/ML INJ SUSP (RADIOLOG
80.0000 mg | Freq: Once | INTRAMUSCULAR | Status: AC
  Administered 2022-07-24: 80 mg via EPIDURAL

## 2022-07-24 MED ORDER — IOPAMIDOL (ISOVUE-M 200) INJECTION 41%
1.0000 mL | Freq: Once | INTRAMUSCULAR | Status: AC
  Administered 2022-07-24: 1 mL via EPIDURAL

## 2022-07-24 NOTE — Discharge Instructions (Signed)

## 2022-07-30 ENCOUNTER — Other Ambulatory Visit (HOSPITAL_COMMUNITY): Payer: Self-pay

## 2022-08-01 ENCOUNTER — Encounter: Payer: Self-pay | Admitting: Nurse Practitioner

## 2022-08-01 ENCOUNTER — Ambulatory Visit (INDEPENDENT_AMBULATORY_CARE_PROVIDER_SITE_OTHER): Payer: Medicare Other | Admitting: Nurse Practitioner

## 2022-08-01 ENCOUNTER — Ambulatory Visit (INDEPENDENT_AMBULATORY_CARE_PROVIDER_SITE_OTHER): Payer: Medicare Other | Admitting: Internal Medicine

## 2022-08-01 VITALS — BP 130/88 | HR 60 | Ht 70.0 in | Wt 213.0 lb

## 2022-08-01 DIAGNOSIS — J849 Interstitial pulmonary disease, unspecified: Secondary | ICD-10-CM | POA: Diagnosis not present

## 2022-08-01 DIAGNOSIS — Z23 Encounter for immunization: Secondary | ICD-10-CM

## 2022-08-01 DIAGNOSIS — J45909 Unspecified asthma, uncomplicated: Secondary | ICD-10-CM

## 2022-08-01 DIAGNOSIS — J9611 Chronic respiratory failure with hypoxia: Secondary | ICD-10-CM

## 2022-08-01 LAB — PULMONARY FUNCTION TEST
DL/VA % pred: 110 %
DL/VA: 4.56 ml/min/mmHg/L
DLCO cor % pred: 39 %
DLCO cor: 10.58 ml/min/mmHg
DLCO unc % pred: 39 %
DLCO unc: 10.43 ml/min/mmHg
FEF 25-75 Pre: 3.31 L/sec
FEF2575-%Pred-Pre: 123 %
FEV1-%Pred-Pre: 42 %
FEV1-Pre: 1.46 L
FEV1FVC-%Pred-Pre: 126 %
FEV6-%Pred-Pre: 35 %
FEV6-Pre: 1.55 L
FEV6FVC-%Pred-Pre: 105 %
FVC-%Pred-Pre: 33 %
FVC-Pre: 1.55 L
Pre FEV1/FVC ratio: 94 %
Pre FEV6/FVC Ratio: 100 %

## 2022-08-01 NOTE — Assessment & Plan Note (Signed)
Compensated on current regimen. Continue ICS/LABA therapy.

## 2022-08-01 NOTE — Assessment & Plan Note (Addendum)
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 PFTs today with improvement in spirometry; decline in DLCO when compared to August 2023 but improved when compared to May 2023. His symptom score is improved today. He was unable to tolerate full dose of Esbriet; would prefer to hold off on retrial at lower dose right now.   Patient Instructions  Continue Albuterol inhaler 2 puffs or 3 mL neb every 6 hours as needed for shortness of breath or wheezing. Notify if symptoms persist despite rescue inhaler/neb use.  Continue advair 2 puffs Twice daily. Brush tongue and rinse mouth afterwards  Continue singulair 1 tab At bedtime  Continue omeprazole 1 tab daily  Continue saline nasal spray 2-3 times a day  Continue supplemental oxygen 2 lpm at night and as needed with activity for goal >88-90%   Stay as active as possible; you're doing a great job  Flu shot today   Follow up in 8 weeks DLCO/spirometry then with Dr. [name] or Joellen Jersey Gracie Gupta,NP or sooner if needed

## 2022-08-01 NOTE — Progress Notes (Signed)
$'@Patient'U$  ID: Samuel [name], male    DOB: 01-13-1956, 66 y.o.   MRN: [id number]  Chief Complaint  Patient presents with   Follow-up    Pft results    Referring provider: Antony Contras, MD  HPI: 66 year old male, former remote smoker followed for ILD, drug-induced pneumonitis and chronic respiratory failure. He is a patient of Dr. Golden Pop and last seen in office 06/06/2022. Past medical history significant for allergic tinnitus, asthma, anxiety.  He has multiple myeloma stage II and is followed by Dr. [name].  TEST/EVENTS:  06/15/2021 PFT FVC 33, FEV1 37, ratio 83, TLC 59, DLCOcor 46.  No BD 06/16/2021 echo: EF 65-70%, LVH. RV size and function nl 07/30/2021 HRCT chest:Mediastinal lymph nodes are decreased in size when compared to prior.  Central airways are patent.  Mild diffuse groundglass opacity with peribronchovascular and subpleural reticular glass opacities as well as traction bronchiectasis.  No clear craniocaudal gradient.  There is mild bilateral air trapping.  There is possible honeycomb change of the anterior left upper lobe.  There is a stable solid right middle lobe pulmonary nodule measuring 3 mm.  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 10/16/2021 PFT: FVC 29, FEV1 36, ratio 92, DLCO corrected 37 12/10/2021 PFT: FVC 32, FEV1 40, ratio 94, DLCO corrected 34 01/06/2022 CTA chest: No evidence of PE.  There is cardiomegaly and a small pericardial effusion.  Lung volumes are small.  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  No acute process noted. 06/06/2022 PFT: FVC 30, FEV1 36, ratio 88, DLCO corrected for alveolar volume 51  06/06/2022: OV with Dr. [name]. Tapered off prednisone 2 weeks ago.  Around the same time, also started on pirfenidone at 2 pills 3 times a day.  Going to start 3 pills 3 times daily tomorrow.  Overall he is not feeling all that well.  Symptom scores of worsen.  Definitely feels more dyspneic.  Also having worsening  cough.  No increased oxygen requirements.  Having more fatigue and back pain.  He had an MRI with intrathecal steroid and after that back pain was improved.  Curious if all the symptoms are related to coming off prednisone.  His myeloma is recurring per his report with M protein spiking.  Scheduled to see Dr. [name] in September 2023 and get started on a new regimen recommended by [facility].  Walking desaturation test stable.  Pulmonary function testing is better than December 2022 but seems worse than May 2023 when he was at [facility].  Possibly related to prednisone taper.  Treated with short prednisone taper.  Spirometry/DLCO in 8 weeks.  Follow-up in 9 weeks with symptom score and walk test.  08/01/2022: Today - follow up Patient presents today for follow up with his wife. His spirometry today was improved when compared to August 2023; FVC 33% and FEV1 42%. He did have a decline in his DLCO to 39%. This is decreased from 51% in August 2023 but improved when compared to February 2023. He was on prednisone during his August testing. He has been doing well since we last saw him. He feels like he's come a long way since having COVID at the end of May. He's exercising regularly; using 10 lpm supplemental O2 when on the treadmill as he was previously recommended to do this during pulmonary rehab. He is trying to monitor what he eats and avoiding foods that cause increased reflux. He's also trying to eat more slowly, which he notices reduces  chest pressure. His cough is unchanged since he was here last. He did struggle with significant side effects from [place]; he discussed this with Dr. [name] and decided to stop antifibrotics. He would like to hold off on restarting at a lower dose for right now. He was told by [employer] that he will need to re-qualify for his oxygen.    SYMPTOM SCALE - ILD 08/09/2021 09/25/2021 218# 12/04/2021 222# 01/24/22 222# 04/09/2022 215# 06/06/2022 211# 08/01/2022 213lb  Current  weight       Treadmill 2.2 mph.  As 1.4 miles over 40 minutes.  Uses 8 L oxygen Post may 2nd visit 03/27/22 -2ndcvoid     O2 use ra ra ra ra 2L eert 2L exert ra  Shortness of Breath 0 -> 5 scale with 5 being worst (score 6 If unable to do)             At rest 0 0 0 0 0 4 0  Simple tasks - showers, clothes change, eating, shaving _0 0/5 0 3 1  Household (dishes, doing bed, laundry) x na _1 Shopping 3 1 1.5 0/_2 Walking level at own pace _3 Walking up Stairs _4 3._5 Total (30-36) Dyspnea Score 14 9 8._6 How bad is your cough? 3 1 0.5   3._7 How bad is your fatigue 0 2 0   3   2  How bad is nausea 0 0 0   0 5 0  How bad is vomiting?   0 0 0   0 0 0  How bad is diarrhea? 0 0 0   0 4 0  How bad is anxiety? _8 0.5 0 1  How bad is depression _9 0.5 0 1  Any chronic pain - if so where and how bad x x x   x   x     Allergies  Allergen Reactions   Clarithromycin Other (See Comments)    Hiccups   Ofev [Nintedanib] Other (See Comments)    GI bleeding   Penicillins Other (See Comments)    Child hood    Immunization History  Administered Date(s) Administered   Fluad Quad(high Dose 65+) 08/09/2021, 08/01/2022   Influenza Split 10/27/2017   Influenza, High Dose Seasonal PF 12/01/2017, 11/29/2019, 12/19/2020, 12/24/2021   Influenza, Quadrivalent, Recombinant, Inj, Pf 07/28/2019, 08/11/2020   Influenza-Unspecified 11/26/2011, 07/21/2017   PFIZER(Purple Top)SARS-COV-2 Vaccination 12/27/2019, 01/24/2020, 09/21/2020   Tdap 08/20/2005, 09/07/2015   Zoster, Live 11/06/2017, 05/07/2018    Past Medical History:  Diagnosis Date   Asthma    GERD (gastroesophageal reflux disease)    High cholesterol    under control.    History of blood transfusion    Hypothyroidism    Interstitial lung disease (HCC)    Multiple myeloma (HCC)    Perennial allergic rhinitis    Pneumonia    Sciatic pain, right    Seasonal allergic rhinitis     Thyroid disease     Tobacco History: Social History   Tobacco Use  Smoking Status Former   Packs/day: 0.10   Years: 3.00   Total pack years: 0.30   Types: Cigarettes   Quit date: 10/21/1982   Years since quitting: 39.8  Smokeless Tobacco Never   Counseling given: Not Answered  Outpatient Medications Prior to Visit  Medication Sig Dispense Refill   acyclovir (ZOVIRAX) 400 MG tablet Take 1 tablet (400 mg total) by mouth 2 (two) times daily. 60 tablet 3   albuterol (PROVENTIL) (2.5 MG/3ML) 0.083% nebulizer solution Take 3 mLs (2.5 mg total) by nebulization every 6 (six) hours as needed for wheezing or shortness of breath. 120 mL 12   albuterol (VENTOLIN HFA) 108 (90 Base) MCG/ACT inhaler Inhale 2 puffs into the lungs every 6 hours as needed for wheezing or shortness of breath. 8.5 g 1   ALPRAZolam (XANAX) 0.5 MG tablet Take 0.5 mg by mouth at bedtime.     ASSESS FULL RANGE PEAK METER DEVI as directed.     bisoprolol (ZEBETA) 5 MG tablet Take 1 tablet (5 mg total) by mouth daily. 30 tablet 2   cholecalciferol (VITAMIN D) 1000 UNITS tablet Take 3,000 Units by mouth daily.     EPINEPHrine (EPI-PEN) 0.3 mg/0.3 mL DEVI Inject 0.3 mg into the muscle as needed.     ezetimibe-simvastatin (VYTORIN) 10-40 MG per tablet Take 1 tablet by mouth at bedtime.     fluticasone-salmeterol (ADVAIR HFA) 230-21 MCG/ACT inhaler Inhale 2 puffs into the lungs 2 (two) times daily. Brand name only 1 each 12   ketoconazole (NIZORAL) 2 % cream SMARTSIG:1 Application Topical 1 to 2 Times Daily     montelukast (SINGULAIR) 10 MG tablet Take 10 mg by mouth at bedtime.     Multiple Vitamin (MULTIVITAMIN) tablet Take 1 tablet by mouth daily.     omeprazole (PRILOSEC) 40 MG capsule Take 1 capsule by mouth daily.     OXYGEN Inhale 2 L into the lungs continuous. As needed     sildenafil (REVATIO) 20 MG tablet Take 20 mg by mouth daily as needed (ED).     Simethicone (SIMETHICONE ULTRA STRENGTH) 180 MG CAPS Take 1 capsule  (180 mg total) by mouth 3 (three) times daily as needed. 90 capsule 0   sodium chloride (OCEAN) 0.65 % SOLN nasal spray Place 1 spray into both nostrils as needed for congestion.     tamsulosin (FLOMAX) 0.4 MG CAPS capsule Take 0.4 mg by mouth daily.     thyroid (ARMOUR) 90 MG tablet Take 1 tablet by mouth daily.     triamcinolone cream (KENALOG) 0.1 % Apply 1 application topically daily as needed (sun burn itch).     furosemide (LASIX) 20 MG tablet TAKE ONE TABLET BY MOUTH AS NEEDED FOR FLUID OR EDEMA (Patient not taking: Reported on 08/01/2022) 14 tablet 1   Pirfenidone 267 MG TABS Take 3 tablets (801 mg total) by mouth 3 (three) times daily with meals. (Patient not taking: Reported on 08/01/2022) 270 tablet 4   No facility-administered medications prior to visit.     Review of Systems:   Constitutional: No weight loss or gain, night sweats, fevers, chills, or lassitude. +fatigue HEENT: No headaches, difficulty swallowing, tooth/dental problems, or sore throat. No sneezing, itching, ear ache, nasal congestion, or post nasal drip CV:  No chest pain, orthopnea, PND, swelling in lower extremities, anasarca, dizziness, palpitations, syncope Resp: +shortness of breath with exertion; dry cough. No excess mucus or change in color of mucus. No hemoptysis. No wheezing.  No chest wall deformity GI:  No heartburn, indigestion, abdominal pain, nausea, vomiting, diarrhea, change in bowel habits, loss of appetite, bloody stools.  Neuro: No dizziness or lightheadedness.  Psych: No depression or anxiety. Mood stable.     Physical Exam:  BP 130/88 (BP  Location: Right Arm, Cuff Size: Normal)   Pulse 60   Ht _0  (1.778 m)   Wt 213 lb (96.6 kg)   SpO2 97%   BMI 30.56 kg/m   GEN: Pleasant, interactive, well-appearing; obese; in no acute distress. HEENT:  Normocephalic and atraumatic. PERRLA. Sclera white. Nasal turbinates pink, moist and patent bilaterally. No rhinorrhea present. Oropharynx pink  and moist, without exudate or edema. No lesions, ulcerations, or postnasal drip.  NECK:  Supple w/ fair ROM. No JVD present. Normal carotid impulses w/o bruits. Thyroid symmetrical with no goiter or nodules palpated. No lymphadenopathy.   CV: RRR, no m/r/g, no peripheral edema. Pulses intact, +2 bilaterally. No cyanosis, pallor or clubbing. PULMONARY:  Unlabored, regular breathing. Clear bilaterally A&P w/o wheezes/rales/rhonchi. No accessory muscle use.  GI: BS present and normoactive. Soft, non-tender to palpation. No organomegaly or masses detected.  MSK: No erythema, warmth or tenderness. No deformities or joint swelling noted.  Neuro: A/Ox3. No focal deficits noted.   Skin: Warm, no lesions or rashe Psych: Normal affect and behavior. Judgement and thought content appropriate.     Lab Results:  CBC    Component Value Date/Time   WBC 6.5 07/03/2022 1331   WBC 6.6 01/06/2022 0146   RBC 4.43 07/03/2022 1331   HGB 14.1 07/03/2022 1331   HCT 40.2 07/03/2022 1331   PLT 205 07/03/2022 1331   MCV 90.7 07/03/2022 1331   MCH 31.8 07/03/2022 1331   MCHC 35.1 07/03/2022 1331   RDW 12.3 07/03/2022 1331   LYMPHSABS 1.1 07/03/2022 1331   MONOABS 0.5 07/03/2022 1331   EOSABS 0.3 07/03/2022 1331   BASOSABS 0.0 07/03/2022 1331    BMET    Component Value Date/Time   NA 129 (L) 07/03/2022 1331   K 4.3 07/03/2022 1331   CL 95 (L) 07/03/2022 1331   CO2 26 07/03/2022 1331   GLUCOSE 90 07/03/2022 1331   BUN 19 07/03/2022 1331   CREATININE 0.88 07/03/2022 1331   CALCIUM 9.1 07/03/2022 1331   GFRNONAA >60 07/03/2022 1331    BNP    Component Value Date/Time   BNP 32.3 06/15/2021 1416     Imaging:  DG INJECT DIAG/THERA/INC NEEDLE/CATH/PLC EPI/LUMB/SAC W/IMG  Result Date: 07/24/2022 CLINICAL DATA:  Spondylosis without myelopathy. Good response to the previous injection but with recurrence of symptoms. Again, the patient complains primarily of back pain to right hip pain. There is  only a small amount of right lateral calf L5 symptomatology. FLUOROSCOPY: Radiation Exposure Index (as provided by the fluoroscopic device): 0 minutes 35 seconds. 22.35 micro gray meter squared PROCEDURE: The procedure, risks, benefits, and alternatives were explained to the patient. Questions regarding the procedure were encouraged and answered. The patient understands and consents to the procedure. LUMBAR EPIDURAL INJECTION: An interlaminar approach was performed on right at L4-5. The overlying skin was cleansed and anesthetized. A 20 gauge epidural needle was advanced using loss-of-resistance technique. DIAGNOSTIC EPIDURAL INJECTION: Injection of Isovue-M 200 shows a good epidural pattern with spread above and below the level of needle placement, primarily on the right, but to both sides. No vascular opacification is seen. THERAPEUTIC EPIDURAL INJECTION: Eighty mg of Depo-Medrol mixed with 2.5 cc 1% lidocaine were instilled. The procedure was well-tolerated, and the patient was discharged thirty minutes following the injection in good condition. COMPLICATIONS: None IMPRESSION: IMPRESSION Technically successful right L4-5 epidural injection 2. Electronically Signed   By: Nelson Chimes M.D.   On: 07/24/2022 09:22  Latest Ref Rng & Units 08/01/2022    1:57 PM 06/06/2022    1:52 PM 12/10/2021    9:24 AM 10/16/2021    3:56 PM 06/15/2021   10:13 PM  PFT Results  FVC-Pre L 1.55  P 1.43  1.49  1.35  1.55   FVC-Predicted Pre % 33  P 30  32  29  33   FVC-Post L     1.45   FVC-Predicted Post %     31   Pre FEV1/FVC % % 94  P 88  94  92  83   Post FEV1/FCV % %     83   FEV1-Pre L 1.46  P 1.26  1.40  1.24  1.28   FEV1-Predicted Pre % 42  P 36  40  36  37   FEV1-Post L     1.20   DLCO uncorrected ml/min/mmHg 10.43  P 13.19  8.64  9.99  12.00   DLCO UNC% % 39  P 49  32  37  44   DLCO corrected ml/min/mmHg 10.58  P 13.77  9.20  10.04  12.52   DLCO COR %Predicted % 39  P 51  34  37  46   DLVA Predicted  % 110  P 128  97  109  118   TLC L     4.18   TLC % Predicted %     59   RV % Predicted %     95     P Preliminary result    No results found for: "NITRICOXIDE"      Assessment & Plan:   ILD (interstitial lung disease) ([place]) 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 PFTs today with improvement in spirometry; decline in DLCO when compared to August 2023 but improved when compared to May 2023. His symptom score is improved today. He was unable to tolerate full dose of Esbriet; would prefer to hold off on retrial at lower dose right now.   Patient Instructions  Continue Albuterol inhaler 2 puffs or 3 mL neb every 6 hours as needed for shortness of breath or wheezing. Notify if symptoms persist despite rescue inhaler/neb use.  Continue advair 2 puffs Twice daily. Brush tongue and rinse mouth afterwards  Continue singulair 1 tab At bedtime  Continue omeprazole 1 tab daily  Continue saline nasal spray 2-3 times a day  Continue supplemental oxygen 2 lpm at night and as needed with activity for goal >88-90%   Stay as active as possible; you're doing a great job  Flu shot today   Follow up in 8 weeks DLCO/spirometry then with Dr. [name] or Joellen Jersey Antolin Belsito,NP or sooner if needed     Chronic respiratory failure with hypoxia ([place]) [employer] requiring re-qualification for his oxygen. Walking oximetry today. He was able to maintain sats on room air at average pace; he desaturated to 87% on room air with stair climbing, which re-qualifies him for supplemental O2. He will continue to use oxygen as needed with activity for goal >88-90% and at night.   Asthma Compensated on current regimen. Continue ICS/LABA therapy.    I spent 38 minutes of dedicated to the care of this patient on the date of this encounter to include pre-visit review of records, face-to-face time with the patient discussing conditions above, post visit ordering of testing, clinical  documentation with the electronic health record, making appropriate referrals as documented, and communicating necessary findings to members of the patients  care team.  Clayton Bibles, NP 08/01/2022  Pt aware and understands NP's role.

## 2022-08-01 NOTE — Patient Instructions (Addendum)
Continue Albuterol inhaler 2 puffs or 3 mL neb every 6 hours as needed for shortness of breath or wheezing. Notify if symptoms persist despite rescue inhaler/neb use.  Continue advair 2 puffs Twice daily. Brush tongue and rinse mouth afterwards  Continue singulair 1 tab At bedtime  Continue omeprazole 1 tab daily  Continue saline nasal spray 2-3 times a day  Continue supplemental oxygen 2 lpm at night and as needed with activity for goal >88-90%   Stay as active as possible; you're doing a great job  Flu shot today   Follow up in 8 weeks DLCO/spirometry then with Dr. [name] or Samuel Jersey Ayeza Therriault,NP or sooner if needed

## 2022-08-01 NOTE — Assessment & Plan Note (Signed)
[employer] requiring re-qualification for his oxygen. Walking oximetry today. He was able to maintain sats on room air at average pace; he desaturated to 87% on room air with stair climbing, which re-qualifies him for supplemental O2. He will continue to use oxygen as needed with activity for goal >88-90% and at night.

## 2022-08-01 NOTE — Progress Notes (Signed)
Spirometry/DLCO performed today. 

## 2022-08-01 NOTE — Patient Instructions (Signed)
Spirometry/DLCO performed today. 

## 2022-08-02 ENCOUNTER — Inpatient Hospital Stay (HOSPITAL_BASED_OUTPATIENT_CLINIC_OR_DEPARTMENT_OTHER): Payer: Medicare Other | Admitting: Hematology and Oncology

## 2022-08-02 ENCOUNTER — Other Ambulatory Visit: Payer: Self-pay | Admitting: Hematology and Oncology

## 2022-08-02 ENCOUNTER — Inpatient Hospital Stay: Payer: Medicare Other | Attending: Hematology and Oncology

## 2022-08-02 VITALS — BP 135/85 | HR 62 | Temp 97.5°F | Resp 15 | Wt 212.2 lb

## 2022-08-02 DIAGNOSIS — C9000 Multiple myeloma not having achieved remission: Secondary | ICD-10-CM

## 2022-08-02 DIAGNOSIS — R0602 Shortness of breath: Secondary | ICD-10-CM | POA: Diagnosis not present

## 2022-08-02 LAB — CBC WITH DIFFERENTIAL (CANCER CENTER ONLY)
Abs Immature Granulocytes: 0.05 10*3/uL (ref 0.00–0.07)
Basophils Absolute: 0 10*3/uL (ref 0.0–0.1)
Basophils Relative: 0 %
Eosinophils Absolute: 0.5 10*3/uL (ref 0.0–0.5)
Eosinophils Relative: 5 %
HCT: 40.7 % (ref 39.0–52.0)
Hemoglobin: 14.1 g/dL (ref 13.0–17.0)
Immature Granulocytes: 1 %
Lymphocytes Relative: 11 %
Lymphs Abs: 1.1 10*3/uL (ref 0.7–4.0)
MCH: 31.3 pg (ref 26.0–34.0)
MCHC: 34.6 g/dL (ref 30.0–36.0)
MCV: 90.4 fL (ref 80.0–100.0)
Monocytes Absolute: 0.8 10*3/uL (ref 0.1–1.0)
Monocytes Relative: 8 %
Neutro Abs: 7.7 10*3/uL (ref 1.7–7.7)
Neutrophils Relative %: 75 %
Platelet Count: 211 10*3/uL (ref 150–400)
RBC: 4.5 MIL/uL (ref 4.22–5.81)
RDW: 12.8 % (ref 11.5–15.5)
WBC Count: 10.2 10*3/uL (ref 4.0–10.5)
nRBC: 0 % (ref 0.0–0.2)

## 2022-08-02 LAB — CMP (CANCER CENTER ONLY)
ALT: 23 U/L (ref 0–44)
AST: 20 U/L (ref 15–41)
Albumin: 4.1 g/dL (ref 3.5–5.0)
Alkaline Phosphatase: 39 U/L (ref 38–126)
Anion gap: 4 — ABNORMAL LOW (ref 5–15)
BUN: 19 mg/dL (ref 8–23)
CO2: 29 mmol/L (ref 22–32)
Calcium: 9.5 mg/dL (ref 8.9–10.3)
Chloride: 98 mmol/L (ref 98–111)
Creatinine: 0.98 mg/dL (ref 0.61–1.24)
GFR, Estimated: >60 mL/min (ref >60)
Glucose, Bld: 85 mg/dL (ref 70–99)
Potassium: 4.1 mmol/L (ref 3.5–5.1)
Sodium: 131 mmol/L — ABNORMAL LOW (ref 135–145)
Total Bilirubin: 0.3 mg/dL (ref 0.3–1.2)
Total Protein: 7.1 g/dL (ref 6.5–8.1)

## 2022-08-02 LAB — LACTATE DEHYDROGENASE: LDH: 196 U/L — ABNORMAL HIGH (ref 98–192)

## 2022-08-02 NOTE — Progress Notes (Signed)
[place] [phone]   [phone]  PROGRESS NOTE  Patient Care Team: Antony Contras, MD as PCP - General (Family Medicine)  Hematological/Oncological History # IgG Kappa Multiple Myeloma 02/02/2021:  Presented to [facility] due to right sided flank tenderness with bruising. CT abdomen/pelvis: Multiple small lytic lesions in the thoracolumbar spine and bilateral pelvis -SPEP: IgG 2,082 (H), M Protein 1.8 (H). IFE shows IgG monoclonal protein with kappa light chain specificity.  -LDH 169, CBC normal, CMP normal except for sodium 131 (L), Chloride 96 (L).   02/14/2021: Establish care with Dede Query PA-C 02/22/2021: bone marrow biopsy confirms the diagnosis of Multiple Myeloma with a monoclonal plasma cell population.  03/16/2021: Cycle 1 Day 1 of VRd chemotherapy  04/06/2021: Cycle 2 Day 1 of VRd chemotherapy  04/27/2021: Cycle 3 Day 1 of VRd chemotherapy  05/18/2021: Cycle 4 Day 1 of VRd chemotherapy  06/01/2021: drop dexamethasone to 67m PO weekly and start lasix due to shortness of breath.  06/15/2021: Desaturation to 87% on ambulation. HELD velcade today and sent to ED for evaluation.  06/22/2021: Findings consistent with drug reaction the lungs with eosinophils on BAL.  Given these findings we will definitely hold [name] and plan to avoid pomalidomide 06/29/2021: Cycle 1 Day 1 CyBorD chemotherapy 07/05/2021: HOLD chemotherapy given worsening lung function  Interval History:  Samuel [name] 66y.o. male with medical history significant for IgG Kappa multiple myeloma who presents for a follow up visit. The patient's last visit was on 07/03/2022 with Dr. DLorenso Courier He is accompanied by his wife for this visit.   Mr. TKroftreports he feels improved from his prior visit.  He reports he visited with the pulmonologist yesterday and notes unfortunately his lung capacity is similar and his diffusion DLCO has decreased.  He reports that things were going well until  May when he developed COVID.  He reports that he has been doing his best to continue golfing and does about 9 holes at a time.  He notes that less and less his oxygen level dropped below the 88%.  He reports that he is aware we need to start myeloma treatment when feasible but he feels incredibly vulnerable with his lungs.  He notes that he has been told that his lungs "cannot afford to go backwards".  Otherwise has had no other major changes in his health.  He has had no recent infections or infectious symptoms.  He denies fevers, chills, sweat, chest pain or cough. He has no other complaints. Rest of the 10 point ROS is listed below.  MEDICAL HISTORY:  Past Medical History:  Diagnosis Date   Asthma    GERD (gastroesophageal reflux disease)    High cholesterol    under control.    History of blood transfusion    Hypothyroidism    [place]    Multiple myeloma ([facility])    Perennial allergic rhinitis    Pneumonia    Sciatic pain, right    Seasonal allergic rhinitis    Thyroid disease     SURGICAL HISTORY: Past Surgical History:  Procedure Laterality Date   BRONCHIAL BIOPSY  06/18/2021   Procedure: BRONCHIAL BIOPSIES;  Surgeon: BCollene Gobble MD;  Location: [facility];  Service: Cardiopulmonary;;   BRONCHIAL BIOPSY  08/28/2021   Procedure: BRONCHIAL BIOPSIES;  Surgeon: IGarner Nash DO;  [place]  Service: Cardiopulmonary;;   BRONCHIAL WASHINGS  06/18/2021   Procedure: BRONCHIAL WASHINGS;  Surgeon: BCollene Gobble  MD;  Location: [facility];  Service: Cardiopulmonary;;   BRONCHIAL WASHINGS  08/28/2021   Procedure: BRONCHIAL WASHINGS;  Surgeon: Garner Nash, DO;  Location: [facility];  Service: Cardiopulmonary;;   NASAL SINUS SURGERY     NECK SURGERY  1996   VIDEO BRONCHOSCOPY N/A 06/18/2021   Procedure: VIDEO BRONCHOSCOPY WITH FLUORO;  Surgeon: Collene Gobble, MD;  Location: [facility];  Service: Cardiopulmonary;  Laterality: N/A;    VIDEO BRONCHOSCOPY N/A 08/28/2021   Procedure: VIDEO BRONCHOSCOPY WITH FLUORO;  Surgeon: Garner Nash, DO;  Location: [facility];  Service: Cardiopulmonary;  Laterality: N/A;    SOCIAL HISTORY: Social History   Socioeconomic History   Marital status: Married    Spouse name: Not on file   Number of children: 3   Years of education: Not on file   Highest education level: Not on file  Occupational History   Not on file  Tobacco Use   Smoking status: Former    Packs/day: 0.10    Years: 3.00    Total pack years: 0.30    Types: Cigarettes    Quit date: 10/21/1982    Years since quitting: 39.8   Smokeless tobacco: Never  Vaping Use   Vaping Use: Never used  Substance and Sexual Activity   Alcohol use: Not Currently    Comment: 1 drink daily   Drug use: No   Sexual activity: Not on file  Other Topics Concern   Not on file  Social History Narrative   Not on file   Social Determinants of Health   Financial Resource Strain: Not on file  Food Insecurity: Not on file  Transportation Needs: Not on file  Physical Activity: Not on file  Stress: Not on file  Social Connections: Not on file  Intimate Partner Violence: Not on file    FAMILY HISTORY: Family History  Problem Relation Age of Onset   Hypertension Mother    Allergies Mother    Allergies Father    CAD Father 61   Breast cancer Paternal Grandmother    Hypertension Other    Heart disease Other     ALLERGIES:  is allergic to clarithromycin, ofev [nintedanib], and penicillins.  MEDICATIONS:  Current Outpatient Medications  Medication Sig Dispense Refill   acyclovir (ZOVIRAX) 400 MG tablet Take 1 tablet (400 mg total) by mouth 2 (two) times daily. 60 tablet 3   albuterol (PROVENTIL) (2.5 MG/3ML) 0.083% nebulizer solution Take 3 mLs (2.5 mg total) by nebulization every 6 (six) hours as needed for wheezing or shortness of breath. 120 mL 12   albuterol (VENTOLIN HFA) 108 (90 Base) MCG/ACT inhaler Inhale 2 puffs  into the lungs every 6 hours as needed for wheezing or shortness of breath. 8.5 g 1   ALPRAZolam (XANAX) 0.5 MG tablet Take 0.5 mg by mouth at bedtime.     ASSESS FULL RANGE PEAK METER DEVI as directed.     bisoprolol (ZEBETA) 5 MG tablet Take 1 tablet (5 mg total) by mouth daily. 30 tablet 2   cholecalciferol (VITAMIN D) 1000 UNITS tablet Take 3,000 Units by mouth daily.     EPINEPHrine (EPI-PEN) 0.3 mg/0.3 mL DEVI Inject 0.3 mg into the muscle as needed.     ezetimibe-simvastatin (VYTORIN) 10-40 MG per tablet Take 1 tablet by mouth at bedtime.     fluticasone (FLONASE) 50 MCG/ACT nasal spray 1 spray.     fluticasone-salmeterol (ADVAIR HFA) 230-21 MCG/ACT inhaler Inhale 2 puffs into the lungs 2 (two) times  daily. Brand name only 1 each 12   furosemide (LASIX) 20 MG tablet TAKE ONE TABLET BY MOUTH AS NEEDED FOR FLUID OR EDEMA (Patient not taking: Reported on 08/01/2022) 14 tablet 1   ketoconazole (NIZORAL) 2 % cream SMARTSIG:1 Application Topical 1 to 2 Times Daily     montelukast (SINGULAIR) 10 MG tablet Take 10 mg by mouth at bedtime.     Multiple Vitamin (MULTIVITAMIN) tablet Take 1 tablet by mouth daily.     omeprazole (PRILOSEC) 40 MG capsule Take 1 capsule by mouth daily.     OXYGEN Inhale 2 L into the lungs continuous. As needed     sildenafil (REVATIO) 20 MG tablet Take 20 mg by mouth daily as needed (ED).     Simethicone (SIMETHICONE ULTRA STRENGTH) 180 MG CAPS Take 1 capsule (180 mg total) by mouth 3 (three) times daily as needed. 90 capsule 0   sodium chloride (OCEAN) 0.65 % SOLN nasal spray Place 1 spray into both nostrils as needed for congestion.     tamsulosin (FLOMAX) 0.4 MG CAPS capsule Take 0.4 mg by mouth daily.     thyroid (ARMOUR) 90 MG tablet Take 1 tablet by mouth daily.     triamcinolone cream (KENALOG) 0.1 % Apply 1 application topically daily as needed (sun burn itch).     No current facility-administered medications for this visit.    REVIEW OF SYSTEMS:    Constitutional: ( - ) fevers, ( - )  chills , ( - ) night sweats Eyes: ( - ) blurriness of vision, ( - ) double vision, ( - ) watery eyes Ears, nose, mouth, throat, and face: ( - ) mucositis, ( - ) sore throat Respiratory: ( - ) cough, ( + ) dyspnea, ( - ) wheezes Cardiovascular: ( - ) palpitation, ( - ) chest discomfort, ( - ) lower extremity swelling Gastrointestinal:  ( - ) nausea, ( - ) heartburn, ( - ) change in bowel habits Skin: ( - ) abnormal skin rashes Lymphatics: ( - ) new lymphadenopathy, ( - ) easy bruising Neurological: ( - ) numbness, ( - ) tingling, ( - ) new weaknesses Behavioral/Psych: ( - ) mood change, ( - ) new changes  All other systems were reviewed with the patient and are negative.  PHYSICAL EXAMINATION: ECOG PERFORMANCE STATUS: 1 - Symptomatic but completely ambulatory  Vitals:   08/02/22 1029  BP: 135/85  Pulse: 62  Resp: 15  Temp: (!) 97.5 F (36.4 C)  SpO2: 95%      Filed Weights   08/02/22 1029  Weight: 212 lb 3.2 oz (96.3 kg)     GENERAL: well appearing middle aged Caucasian male alert, no distress and comfortable SKIN: skin color, texture, turgor are normal, no rashes or significant lesions EYES: conjunctiva are pink and non-injected, sclera clear LUNGS: Decreased breath sounds with no crackles or rails.  No wheezing noted. HEART: regular rate & rhythm and no murmurs and no lower extremity edema Musculoskeletal: no cyanosis of digits and no clubbing  PSYCH: alert & oriented x 3, fluent speech NEURO: no focal motor/sensory deficits  LABORATORY DATA:  I have reviewed the data as listed    Latest Ref Rng & Units 08/02/2022    9:50 AM 07/03/2022    1:31 PM 05/17/2022    2:43 PM  CBC  WBC 4.0 - 10.5 K/uL 10.2  6.5  7.5   Hemoglobin 13.0 - 17.0 g/dL 14.1  14.1  13.2   Hematocrit 39.0 -  52.0 % 40.7  40.2  38.0   Platelets 150 - 400 K/uL 211  205  240        Latest Ref Rng & Units 08/02/2022    9:50 AM 07/03/2022    1:31 PM 05/17/2022     2:43 PM  CMP  Glucose 70 - 99 mg/dL 85  90  101   BUN 8 - 23 mg/dL _0 Creatinine 0.61 - 1.24 mg/dL 0.98  0.88  0.83   Sodium 135 - 145 mmol/L 131  129  129   Potassium 3.5 - 5.1 mmol/L 4.1  4.3  4.7   Chloride 98 - 111 mmol/L 98  95  97   CO2 22 - 32 mmol/L _1 Calcium 8.9 - 10.3 mg/dL 9.5  9.1  8.9   Total Protein 6.5 - 8.1 g/dL 7.1  7.3  7.3   Total Bilirubin 0.3 - 1.2 mg/dL 0.3  0.4  0.4   Alkaline Phos 38 - 126 U/L 39  37  36   AST 15 - 41 U/L _2 ALT 0 - 44 U/L _3 Lab Results  Component Value Date   MPROTEIN 0.3 (H) 08/02/2022   MPROTEIN 0.3 (H) 07/03/2022   MPROTEIN 0.5 (H) 05/17/2022   Lab Results  Component Value Date   KPAFRELGTCHN 26.9 (H) 08/02/2022   KPAFRELGTCHN 23.5 (H) 07/03/2022   KPAFRELGTCHN 27.9 (H) 05/17/2022   LAMBDASER 10.1 08/02/2022   LAMBDASER 9.8 07/03/2022   LAMBDASER 9.7 05/17/2022   KAPLAMBRATIO 2.66 (H) 08/02/2022   KAPLAMBRATIO 2.40 (H) 07/03/2022   KAPLAMBRATIO 2.88 (H) 05/17/2022    RADIOGRAPHIC STUDIES: I have personally reviewed the radiological images as listed and agreed with the findings in the report: lytic lesions in the hip bones bilaterally.  DG INJECT DIAG/THERA/INC NEEDLE/CATH/PLC EPI/LUMB/SAC W/IMG  Result Date: 07/24/2022 CLINICAL DATA:  Spondylosis without myelopathy. Good response to the previous injection but with recurrence of symptoms. Again, the patient complains primarily of back pain to right hip pain. There is only a small amount of right lateral calf L5 symptomatology. FLUOROSCOPY: Radiation Exposure Index (as provided by the fluoroscopic device): 0 minutes 35 seconds. 22.35 micro gray meter squared PROCEDURE: The procedure, risks, benefits, and alternatives were explained to the patient. Questions regarding the procedure were encouraged and answered. The patient understands and consents to the procedure. LUMBAR EPIDURAL INJECTION: An interlaminar approach was performed on  right at L4-5. The overlying skin was cleansed and anesthetized. A 20 gauge epidural needle was advanced using loss-of-resistance technique. DIAGNOSTIC EPIDURAL INJECTION: Injection of Isovue-M 200 shows a good epidural pattern with spread above and below the level of needle placement, primarily on the right, but to both sides. No vascular opacification is seen. THERAPEUTIC EPIDURAL INJECTION: Eighty mg of Depo-Medrol mixed with 2.5 cc 1% lidocaine were instilled. The procedure was well-tolerated, and the patient was discharged thirty minutes following the injection in good condition. COMPLICATIONS: None IMPRESSION: IMPRESSION Technically successful right L4-5 epidural injection 2. Electronically Signed   By: Nelson Chimes M.D.   On: 07/24/2022 09:22     ASSESSMENT & PLAN Samuel [name] 66 y.o. male with medical history significant for IgG Kappa multiple myeloma who presents for a follow up visit.   After review of the labs, review of the records, and discussion with the patient the findings are most consistent with an IgG  kappa multiple myeloma.  The patient has 2 lytic lesions within the pelvic bones (more noted in spine on CT scan) as well as 10% plasma cells within the bone marrow biopsy.  This combined with his serological findings confirm the diagnosis of multiple myeloma.  The initial treatment of choice for this patient's multiple myeloma was VRd. This will consist of bortezomib 1.63m/m2 on days 1, 8, 15, dexamethasone 477mon days 1,8, and 15, and revlimid 252mO daily days 1-14. Cycles will consist of 21 days. We will use this regimen to stabilize the patient's myeloma, then make a referral to a BMT center of his choosing for consideration of a bone marrow transplant once he has been noted to have a good response (VGPR or better). Zometa was started after dental clearance was obtained. This will be continued x 2 years.   Unfortunately had a drug reaction to [name] and was admitted to the  hospital from 06/15/2021 until 06/18/2021.  The patient underwent a B AL which clearly showed evidence of eosinophils.  This is consistent with a drug reaction most likely caused by the patient's [name].  Discussed the case with pulmonology who recommended that we not rechallenge for attempt other drugs in the same class such as pomalidomide.  Given [facility] preference would be to continue that.  As such I would recommend we proceed with CyBorD chemotherapy.  Daratumumab-based therapy could have been considered, but is often paired with an immunologic.  Additionally I would like to preserve those for additional lines of therapy if necessary.  Therefore we will proceed with CyBorD chemotherapy have the patient referred to transplant.  R-IPSS score: Stage 2. PFS of 42 months  # IgG Kappa Multiple Myeloma (t11;14, standard risk)  --diagnostic criteria was met with monoclonal plasma cells in the bone marrow and lytic lesions on the bone --recommend proceeding with VRd chemotherapy as noted above --patient is young and healthy enough for consideration of BMT, though he is borderline with his age. Will consider referral pending response to treatment.  -- Cycle 1 Day 1 started on 03/16/2021 --decreased dexamethasone to 11m30m weekly due to fluid overload. Also started lasix 11mg32m(changed on 06/01/2021) --plan to HOLD revlimid moving forward after evidence of drug reaction in the lungs. --started CyBorD chemotherapy on 06/29/2021 --patient has reached a VGPR.  Plan: --Labs today were reviewed. No cytopenias, renal dysfunction or hypercalcemia. MM panel and sFLC pending. --If M protein increases above 0.5 g/dL, recommend to resume therapy. Last level noted at 0.3.  --[facility] recommended Dara/[name]/dexamethasone which Dr. DorseLorenso Courieres is a reasonable regimen. Patient was aware of the risks and benefits regarding possible worsening of the lung  function. --Labs show white blood cell count 10.2, hemoglobin 14.1, MCV 90.4, and platelets of 211 --RTC in 4 weeks with labs unless pending labs require intervention.  #Fatigue: --Labs from today don't show clear etiology.  --Patient has chronic hyponatremia but doesn't explain recent symptoms. --Recommend to follow up with PCP to evaluate for other underlying causes for fatigue such as thyroid dysfunction, vitamin deficiencies, etc.   #Drug Reaction in the Lung --confirmed with increased eosinophils on BAL during admission for shortness of breath --on  pirfenidone and steroid taper managed by pulmonologist, Dr. RamasChase Callerupportive Care -- chemotherapy education complete --zometa therapy started on 04/05/2021 (4 mg IV q 3 months), dose held today per patient's request. --ASA 81mg 104maily for thromboprophylaxis on revlimid -- zofran 8mg q814m  PRN and compazine 43m PO q6H for nausea -- acyclovir 4024mPO BID for VCZ prophylaxis -- no pain medication required at this time.   No orders of the defined types were placed in this encounter.  All questions were answered. The patient knows to call the clinic with any problems, questions or concerns.  I have spent a total of 30 minutes minutes of face-to-face and non-face-to-face time, preparing to see the pa[place]unnewell medically appropriate examination, counseling and educating the patient,  documenting clinical information in the electronic health record,  and care coordination.   JoLedell PeoplesMD Department of Hematology/Oncology [facility]: [phone]: [phone]: [email or web address]    08/13/2022 3:25 PM

## 2022-08-03 LAB — BETA 2 MICROGLOBULIN, SERUM: Beta-2 Microglobulin: 1.5 mg/L (ref 0.6–2.4)

## 2022-08-05 LAB — KAPPA/LAMBDA LIGHT CHAINS
Kappa free light chain: 26.9 mg/L — ABNORMAL HIGH (ref 3.3–19.4)
Kappa, lambda light chain ratio: 2.66 — ABNORMAL HIGH (ref 0.26–1.65)
Lambda free light chains: 10.1 mg/L (ref 5.7–26.3)

## 2022-08-07 LAB — MULTIPLE MYELOMA PANEL, SERUM
Albumin SerPl Elph-Mcnc: 3.6 g/dL (ref 2.9–4.4)
Albumin/Glob SerPl: 1.3 (ref 0.7–1.7)
Alpha 1: 0.2 g/dL (ref 0.0–0.4)
Alpha2 Glob SerPl Elph-Mcnc: 0.7 g/dL (ref 0.4–1.0)
B-Globulin SerPl Elph-Mcnc: 0.9 g/dL (ref 0.7–1.3)
Gamma Glob SerPl Elph-Mcnc: 1.1 g/dL (ref 0.4–1.8)
Globulin, Total: 2.9 g/dL (ref 2.2–3.9)
IgA: 127 mg/dL (ref 61–437)
IgG (Immunoglobin G), Serum: 1092 mg/dL (ref 603–1613)
IgM (Immunoglobulin M), Srm: 63 mg/dL (ref 20–172)
M Protein SerPl Elph-Mcnc: 0.3 g/dL — ABNORMAL HIGH
Total Protein ELP: 6.5 g/dL (ref 6.0–8.5)

## 2022-08-13 ENCOUNTER — Encounter: Payer: Self-pay | Admitting: Hematology and Oncology

## 2022-08-15 ENCOUNTER — Telehealth: Payer: Self-pay | Admitting: *Deleted

## 2022-08-15 NOTE — Telephone Encounter (Signed)
[name] patient regarding recent lab results. Spoke with him. Advised that his M protein remains stable @ 0,3. Pt very pleased with this. States he feels well overall. He is aware of his appt next month.

## 2022-08-15 NOTE — Telephone Encounter (Signed)
-----   Message from Orson Slick, MD sent at 08/11/2022  7:10 PM EDT ----- Regarding: P2 Please let Mr. Rosetta Posner know that remarkably his M protein is at 0.3.  We will continue to monitor. ----- Message ----- From: Interface, Lab In [place] Sent: 08/02/2022  10:05 AM EDT To: Orson Slick, MD

## 2022-08-27 ENCOUNTER — Other Ambulatory Visit: Payer: Self-pay | Admitting: Hematology and Oncology

## 2022-08-27 ENCOUNTER — Other Ambulatory Visit (HOSPITAL_COMMUNITY): Payer: Self-pay

## 2022-09-06 ENCOUNTER — Telehealth: Payer: Self-pay | Admitting: *Deleted

## 2022-09-06 NOTE — Telephone Encounter (Signed)
Received  vm from pt. He states his lungs/asthma is acting up and would like to cancel his Zometa for next week. Checked with Dr. Tomasa Blase is ok with cancellin the Zometa for 09/10/22. Pt is ok with other appts that day.

## 2022-09-09 ENCOUNTER — Other Ambulatory Visit: Payer: Self-pay | Admitting: Physician Assistant

## 2022-09-09 DIAGNOSIS — C9000 Multiple myeloma not having achieved remission: Secondary | ICD-10-CM

## 2022-09-10 ENCOUNTER — Inpatient Hospital Stay: Payer: Medicare Other | Attending: Hematology and Oncology | Admitting: Physician Assistant

## 2022-09-10 ENCOUNTER — Ambulatory Visit: Payer: Medicare Other

## 2022-09-10 ENCOUNTER — Inpatient Hospital Stay: Payer: Medicare Other

## 2022-09-10 ENCOUNTER — Other Ambulatory Visit (HOSPITAL_COMMUNITY): Payer: Self-pay

## 2022-09-10 VITALS — BP 146/75 | HR 63 | Temp 98.0°F | Resp 17 | Ht 70.0 in | Wt 217.4 lb

## 2022-09-10 DIAGNOSIS — Z87891 Personal history of nicotine dependence: Secondary | ICD-10-CM | POA: Insufficient documentation

## 2022-09-10 DIAGNOSIS — Z79899 Other long term (current) drug therapy: Secondary | ICD-10-CM | POA: Diagnosis not present

## 2022-09-10 DIAGNOSIS — J45909 Unspecified asthma, uncomplicated: Secondary | ICD-10-CM | POA: Insufficient documentation

## 2022-09-10 DIAGNOSIS — Z7951 Long term (current) use of inhaled steroids: Secondary | ICD-10-CM | POA: Insufficient documentation

## 2022-09-10 DIAGNOSIS — C9000 Multiple myeloma not having achieved remission: Secondary | ICD-10-CM

## 2022-09-10 DIAGNOSIS — E039 Hypothyroidism, unspecified: Secondary | ICD-10-CM | POA: Diagnosis not present

## 2022-09-10 DIAGNOSIS — Z79624 Long term (current) use of inhibitors of nucleotide synthesis: Secondary | ICD-10-CM | POA: Insufficient documentation

## 2022-09-10 LAB — CMP (CANCER CENTER ONLY)
ALT: 19 U/L (ref 0–44)
AST: 25 U/L (ref 15–41)
Albumin: 4.4 g/dL (ref 3.5–5.0)
Alkaline Phosphatase: 47 U/L (ref 38–126)
Anion gap: 4 — ABNORMAL LOW (ref 5–15)
BUN: 17 mg/dL (ref 8–23)
CO2: 29 mmol/L (ref 22–32)
Calcium: 9.6 mg/dL (ref 8.9–10.3)
Chloride: 97 mmol/L — ABNORMAL LOW (ref 98–111)
Creatinine: 0.84 mg/dL (ref 0.61–1.24)
GFR, Estimated: >60 mL/min (ref >60)
Glucose, Bld: 91 mg/dL (ref 70–99)
Potassium: 4.5 mmol/L (ref 3.5–5.1)
Sodium: 130 mmol/L — ABNORMAL LOW (ref 135–145)
Total Bilirubin: 0.3 mg/dL (ref 0.3–1.2)
Total Protein: 7.8 g/dL (ref 6.5–8.1)

## 2022-09-10 LAB — CBC WITH DIFFERENTIAL (CANCER CENTER ONLY)
Abs Immature Granulocytes: 0.01 10*3/uL (ref 0.00–0.07)
Basophils Absolute: 0 10*3/uL (ref 0.0–0.1)
Basophils Relative: 0 %
Eosinophils Absolute: 0.1 10*3/uL (ref 0.0–0.5)
Eosinophils Relative: 2 %
HCT: 39.2 % (ref 39.0–52.0)
Hemoglobin: 13.7 g/dL (ref 13.0–17.0)
Immature Granulocytes: 0 %
Lymphocytes Relative: 17 %
Lymphs Abs: 1.3 10*3/uL (ref 0.7–4.0)
MCH: 31.7 pg (ref 26.0–34.0)
MCHC: 34.9 g/dL (ref 30.0–36.0)
MCV: 90.7 fL (ref 80.0–100.0)
Monocytes Absolute: 0.6 10*3/uL (ref 0.1–1.0)
Monocytes Relative: 8 %
Neutro Abs: 5.6 10*3/uL (ref 1.7–7.7)
Neutrophils Relative %: 73 %
Platelet Count: 219 10*3/uL (ref 150–400)
RBC: 4.32 MIL/uL (ref 4.22–5.81)
RDW: 12.4 % (ref 11.5–15.5)
WBC Count: 7.7 10*3/uL (ref 4.0–10.5)
nRBC: 0 % (ref 0.0–0.2)

## 2022-09-10 NOTE — Progress Notes (Signed)
[place] [phone]   [phone]  PROGRESS NOTE  Patient Care Team: Antony Contras, MD as PCP - General (Family Medicine)  Hematological/Oncological History # IgG Kappa Multiple Myeloma 02/02/2021:  Presented to [facility] ED due to right sided flank tenderness with bruising. CT abdomen/pelvis: Multiple small lytic lesions in the thoracolumbar spine and bilateral pelvis -SPEP: IgG 2,082 (H), M Protein 1.8 (H). IFE shows IgG monoclonal protein with kappa light chain specificity.  -LDH 169, CBC normal, CMP normal except for sodium 131 (L), Chloride 96 (L).   02/14/2021: Establish care with Dede Query PA-C 02/22/2021: bone marrow biopsy confirms the diagnosis of Multiple Myeloma with a monoclonal plasma cell population.  03/16/2021: Cycle 1 Day 1 of VRd chemotherapy  04/06/2021: Cycle 2 Day 1 of VRd chemotherapy  04/27/2021: Cycle 3 Day 1 of VRd chemotherapy  05/18/2021: Cycle 4 Day 1 of VRd chemotherapy  06/01/2021: drop dexamethasone to 54m PO weekly and start lasix due to shortness of breath.  06/15/2021: Desaturation to 87% on ambulation. HELD velcade today and sent to ED for evaluation.  06/22/2021: Findings consistent with drug reaction the lungs with eosinophils on BAL.  Given these findings we will definitely hold [name] and plan to avoid pomalidomide 06/29/2021: Cycle 1 Day 1 CyBorD chemotherapy 07/05/2021: HOLD chemotherapy given worsening lung function  Interval History:  VBurney CalzadillaTosco 66y.o. male with medical history significant for IgG Kappa multiple myeloma who presents for a follow up visit. The patient's last visit was on 08/02/2022 with Dr. DLorenso Courier He is accompanied by his wife for this visit.   Samuel [name] he feels having more asthma like symptoms and allergies over the last 1-2 weeks. He reports the symptoms are very different from him previous lung toxicity. He does have more fatigue in the last couple of weeks but continues to stay  active by golfing. He is trying to get a balanced diet. He denies nausea, vomiting or abdominal pain. His bowel habits are unchanged. He denies easy bruising or signs of bleeding. He denies fevers, chills, night sweats, shortness of breath, chest pain or cough. He has no other complaints. Rest of the 10 point ROS is listed below.  MEDICAL HISTORY:  Past Medical History:  Diagnosis Date   Asthma    GERD (gastroesophageal reflux disease)    High cholesterol    under control.    History of blood transfusion    Hypothyroidism    Interstitial lung disease (HCC)    Multiple myeloma (HCC)    Perennial allergic rhinitis    Pneumonia    Sciatic pain, right    Seasonal allergic rhinitis    Thyroid disease     SURGICAL HISTORY: Past Surgical History:  Procedure Laterality Date   BRONCHIAL BIOPSY  06/18/2021   Procedure: BRONCHIAL BIOPSIES;  Surgeon: BCollene Gobble MD;  Location: [facility];  Service: Cardiopulmonary;;   BRONCHIAL BIOPSY  08/28/2021   Procedure: BRONCHIAL BIOPSIES;  Surgeon: IGarner Nash DO;  [facility] [facility]  Service: Cardiopulmonary;;   BRONCHIAL WASHINGS  06/18/2021   Procedure: BRONCHIAL WASHINGS;  Surgeon: BCollene Gobble MD;  Location: [facility];  Service: Cardiopulmonary;;   BRONCHIAL WASHINGS  08/28/2021   Procedure: BRONCHIAL WASHINGS;  Surgeon: IGarner Nash DO;  [place] [facility]  Service: Cardiopulmonary;;   NASAL SINUS SPrestonN/A 06/18/2021   Procedure: VIDEO BRONCHOSCOPY WITH FLUORO;  Surgeon: BCollene Gobble MD;  Location: [facility];  Service: Cardiopulmonary;  Laterality: N/A;   VIDEO BRONCHOSCOPY N/A 08/28/2021   Procedure: VIDEO BRONCHOSCOPY WITH FLUORO;  Surgeon: Garner Nash, DO;  Location: [facility];  Service: Cardiopulmonary;  Laterality: N/A;    SOCIAL HISTORY: Social History   Socioeconomic History   Marital status: Married    Spouse name: Not on file   Number of  children: 3   Years of education: Not on file   Highest education level: Not on file  Occupational History   Not on file  Tobacco Use   Smoking status: Former    Packs/day: 0.10    Years: 3.00    Total pack years: 0.30    Types: Cigarettes    Quit date: 10/21/1982    Years since quitting: 39.9   Smokeless tobacco: Never  Vaping Use   Vaping Use: Never used  Substance and Sexual Activity   Alcohol use: Not Currently    Comment: 1 drink daily   Drug use: No   Sexual activity: Not on file  Other Topics Concern   Not on file  Social History Narrative   Not on file   Social Determinants of Health   Financial Resource Strain: Not on file  Food Insecurity: Not on file  Transportation Needs: Not on file  Physical Activity: Not on file  Stress: Not on file  Social Connections: Not on file  Intimate Partner Violence: Not on file    FAMILY HISTORY: Family History  Problem Relation Age of Onset   Hypertension Mother    Allergies Mother    Allergies Father    CAD Father 69   Breast cancer Paternal Grandmother    Hypertension Other    Heart disease Other     ALLERGIES:  is allergic to clarithromycin, ofev [nintedanib], and penicillins.  MEDICATIONS:  Current Outpatient Medications  Medication Sig Dispense Refill   acyclovir (ZOVIRAX) 400 MG tablet Take 1 tablet (400 mg total) by mouth 2 (two) times daily. 60 tablet 3   albuterol (PROVENTIL) (2.5 MG/3ML) 0.083% nebulizer solution Take 3 mLs (2.5 mg total) by nebulization every 6 (six) hours as needed for wheezing or shortness of breath. 120 mL 12   albuterol (VENTOLIN HFA) 108 (90 Base) MCG/ACT inhaler Inhale 2 puffs into the lungs every 6 hours as needed for wheezing or shortness of breath. 8.5 g 1   ALPRAZolam (XANAX) 0.5 MG tablet Take 0.5 mg by mouth at bedtime.     ASSESS FULL RANGE PEAK METER DEVI as directed.     bisoprolol (ZEBETA) 5 MG tablet Take 1 tablet (5 mg total) by mouth daily. 30 tablet 2   cholecalciferol  (VITAMIN D) 1000 UNITS tablet Take 3,000 Units by mouth daily.     Dextromethorphan HBr (DELSYM PO) Take by mouth as needed. For cough     EPINEPHrine (EPI-PEN) 0.3 mg/0.3 mL DEVI Inject 0.3 mg into the muscle as needed.     ezetimibe-simvastatin (VYTORIN) 10-40 MG per tablet Take 1 tablet by mouth at bedtime.     fluticasone (FLONASE) 50 MCG/ACT nasal spray 1 spray.     fluticasone-salmeterol (ADVAIR HFA) 230-21 MCG/ACT inhaler Inhale 2 puffs into the lungs 2 (two) times daily. Brand name only 1 each 12   furosemide (LASIX) 20 MG tablet TAKE ONE TABLET BY MOUTH AS NEEDED FOR FLUID OR EDEMA 14 tablet 1   ketoconazole (NIZORAL) 2 % cream SMARTSIG:1 Application Topical 1 to 2 Times Daily     montelukast (SINGULAIR) 10  MG tablet Take 10 mg by mouth at bedtime.     Multiple Vitamin (MULTIVITAMIN) tablet Take 1 tablet by mouth daily.     omeprazole (PRILOSEC) 40 MG capsule Take 1 capsule by mouth daily.     OXYGEN Inhale 2 L into the lungs continuous. As needed     sildenafil (REVATIO) 20 MG tablet Take 20 mg by mouth daily as needed (ED).     Simethicone (SIMETHICONE ULTRA STRENGTH) 180 MG CAPS Take 1 capsule (180 mg total) by mouth 3 (three) times daily as needed. 90 capsule 0   sodium chloride (OCEAN) 0.65 % SOLN nasal spray Place 1 spray into both nostrils as needed for congestion.     tamsulosin (FLOMAX) 0.4 MG CAPS capsule Take 0.4 mg by mouth daily.     thyroid (ARMOUR) 180 MG tablet Take 1 tablet by mouth daily.     triamcinolone cream (KENALOG) 0.1 % Apply 1 application topically daily as needed (sun burn itch).     No current facility-administered medications for this visit.    REVIEW OF SYSTEMS:   Constitutional: ( - ) fevers, ( - )  chills , ( - ) night sweats Eyes: ( - ) blurriness of vision, ( - ) double vision, ( - ) watery eyes Ears, nose, mouth, throat, and face: ( - ) mucositis, ( - ) sore throat Respiratory: ( - ) cough, ( + ) dyspnea, ( - ) wheezes Cardiovascular: ( - )  palpitation, ( - ) chest discomfort, ( - ) lower extremity swelling Gastrointestinal:  ( - ) nausea, ( - ) heartburn, ( - ) change in bowel habits Skin: ( - ) abnormal skin rashes Lymphatics: ( - ) new lymphadenopathy, ( - ) easy bruising Neurological: ( - ) numbness, ( - ) tingling, ( - ) new weaknesses Behavioral/Psych: ( - ) mood change, ( - ) new changes  All other systems were reviewed with the patient and are negative.  PHYSICAL EXAMINATION: ECOG PERFORMANCE STATUS: 1 - Symptomatic but completely ambulatory  Vitals:   09/10/22 1306  BP: (!) 146/75  Pulse: 63  Resp: 17  Temp: 98 F (36.7 C)  SpO2: 93%      Filed Weights   09/10/22 1306  Weight: 217 lb 6.4 oz (98.6 kg)     GENERAL: well appearing middle aged Caucasian male alert, no distress and comfortable SKIN: skin color, texture, turgor are normal, no rashes or significant lesions EYES: conjunctiva are pink and non-injected, sclera clear LUNGS: Decreased breath sounds with no crackles or rails.  No wheezing noted. HEART: regular rate & rhythm and no murmurs and no lower extremity edema Musculoskeletal: no cyanosis of digits and no clubbing  PSYCH: alert & oriented x 3, fluent speech NEURO: no focal motor/sensory deficits  LABORATORY DATA:  I have reviewed the data as listed    Latest Ref Rng & Units 09/10/2022   12:40 PM 08/02/2022    9:50 AM 07/03/2022    1:31 PM  CBC  WBC 4.0 - 10.5 K/uL 7.7  10.2  6.5   Hemoglobin 13.0 - 17.0 g/dL 13.7  14.1  14.1   Hematocrit 39.0 - 52.0 % 39.2  40.7  40.2   Platelets 150 - 400 K/uL 219  211  205        Latest Ref Rng & Units 09/10/2022   12:40 PM 08/02/2022    9:50 AM 07/03/2022    1:31 PM  CMP  Glucose 70 - 99 mg/dL 91  85  90   BUN 8 - 23 mg/dL _0 Creatinine 0.61 - 1.24 mg/dL 0.84  0.98  0.88   Sodium 135 - 145 mmol/L 130  131  129   Potassium 3.5 - 5.1 mmol/L 4.5  4.1  4.3   Chloride 98 - 111 mmol/L 97  98  95   CO2 22 - 32 mmol/L _1 Calcium 8.9 - 10.3 mg/dL 9.6  9.5  9.1   Total Protein 6.5 - 8.1 g/dL 7.8  7.1  7.3   Total Bilirubin 0.3 - 1.2 mg/dL 0.3  0.3  0.4   Alkaline Phos 38 - 126 U/L 47  39  37   AST 15 - 41 U/L _2 ALT 0 - 44 U/L _3 Lab Results  Component Value Date   MPROTEIN 0.3 (H) 08/02/2022   MPROTEIN 0.3 (H) 07/03/2022   MPROTEIN 0.5 (H) 05/17/2022   Lab Results  Component Value Date   KPAFRELGTCHN 26.9 (H) 08/02/2022   KPAFRELGTCHN 23.5 (H) 07/03/2022   KPAFRELGTCHN 27.9 (H) 05/17/2022   LAMBDASER 10.1 08/02/2022   LAMBDASER 9.8 07/03/2022   LAMBDASER 9.7 05/17/2022   KAPLAMBRATIO 2.66 (H) 08/02/2022   KAPLAMBRATIO 2.40 (H) 07/03/2022   KAPLAMBRATIO 2.88 (H) 05/17/2022    RADIOGRAPHIC STUDIES: I have personally reviewed the radiological images as listed and agreed with the findings in the report: lytic lesions in the hip bones bilaterally.  No results found.   ASSESSMENT & PLAN Jonpaul Lumm Girdler 66 y.o. male with medical history significant for IgG Kappa multiple myeloma who presents for a follow up visit.   After review of the labs, review of the records, and discussion with the patient the findings are most consistent with an IgG kappa multiple myeloma.  The patient has 2 lytic lesions within the pelvic bones (more noted in spine on CT scan) as well as 10% plasma cells within the bone marrow biopsy.  This combined with his serological findings confirm the diagnosis of multiple myeloma.  The initial treatment of choice for this patient's multiple myeloma was VRd. This will consist of bortezomib 1.43m/m2 on days 1, 8, 15, dexamethasone 453mon days 1,8, and 15, and revlimid 2543mO daily days 1-14. Cycles will consist of 21 days. We will use this regimen to stabilize the patient's myeloma, then make a referral to a BMT center of his choosing for consideration of a bone marrow transplant once he has been noted to have a good response (VGPR or better). Zometa was started  after dental clearance was obtained. This will be continued x 2 years.   Unfortunately had a drug reaction to [name] and was admitted to the hospital from 06/15/2021 until 06/18/2021.  The patient underwent a B AL which clearly showed evidence of eosinophils.  This is consistent with a drug reaction most likely caused by the patient's [name].  Discussed the case with pulmonology who recommended that we not rechallenge for attempt other drugs in the same class such as pomalidomide.  Given the [facility]'s [facility] preference would be to continue that.  As such I would recommend we proceed with CyBorD chemotherapy.  Daratumumab-based therapy could have been considered, but is often paired with an immunologic.  Additionally I would like to preserve those for additional lines of therapy if necessary.  Therefore we will proceed with  CyBorD chemotherapy have the patient referred to transplant.  R-IPSS score: Stage 2. PFS of 42 months  # IgG Kappa Multiple Myeloma (t11;14, standard risk)  --diagnostic criteria was met with monoclonal plasma cells in the bone marrow and lytic lesions on the bone --recommend proceeding with VRd chemotherapy as noted above --patient is young and healthy enough for consideration of BMT, though he is borderline with his age. Will consider referral pending response to treatment.  -- Cycle 1 Day 1 started on 03/16/2021 --decreased dexamethasone to 31m PO weekly due to fluid overload. Also started lasix 231mPO (changed on 06/01/2021) --plan to HOLD revlimid moving forward after evidence of drug reaction in the lungs. --started CyBorD chemotherapy on 06/29/2021 --patient has reached a VGPR.  Plan: --Labs today were reviewed.White blood cell count 7.7, hemoglobin 13.7, MCV 90.7, and platelets of 219. No cytopenias, renal dysfunction or hypercalcemia. MM panel and sFLC pending. --If M protein increases above 0.5 g/dL, recommend to resume therapy.  Last level noted at 0.3.  --[facility] recommended Dara/[name]/dexamethasone which Dr. DoLorenso Couriergrees is a reasonable regimen. Patient was aware of the risks and benefits regarding possible worsening of the lung function. --RTC in 4 weeks with labs unless pending labs require intervention.  #Fatigue: --Labs from today don't show clear etiology.  --Patient has chronic hyponatremia but doesn't explain recent symptoms. --Patient has chronic hypothyroidism and was told recently to increase dose. Patient would like a second opinion with endocrinologist.[facility]   #Drug Reaction in the Lung --confirmed with increased eosinophils on BAL during admission for shortness of breath -- on pirfenidone and steroid taper managed by pulmonologist, Dr. RaChase Caller #Supportive Care -- chemotherapy education complete --zometa therapy started on 04/05/2021 (4 mg IV q 3 months), dose held today per patient's request. --ASA 8153mO daily for thromboprophylaxis on revlimid -- zofran 8mg53mH PRN and compazine 10mg69mq6H for nausea -- acyclovir 400mg 48mID for VCZ prophylaxis -- no pain medication required at this time.   Orders Placed This Encounter  Procedures   Ambulatory referral to Endocrinology    Referral Priority:   Urgent    Referral Type:   Consultation    Referral Reason:   Specialty Services Required    Referred to Provider:   GherghPhilemon Kingdom  Number of Visits Requested:   1   All questions were answered. The patient knows to call the clinic with any problems, questions or concerns.  I have spent a total of 30 minutes minutes of face-to-face and non-face-to-face time, preparing to see the patienFredericksonically appropriate examination, counseling and educating the patient,  documenting clinical information in the electronic health record,  and care coordination.   Samuel [name] Dede QueryDept of Hematology and [facility]: [phone]/21/2023 2:36 PM

## 2022-09-11 ENCOUNTER — Other Ambulatory Visit: Payer: Medicare Other

## 2022-09-11 LAB — KAPPA/LAMBDA LIGHT CHAINS
Kappa free light chain: 29.5 mg/L — ABNORMAL HIGH (ref 3.3–19.4)
Kappa, lambda light chain ratio: 2.42 — ABNORMAL HIGH (ref 0.26–1.65)
Lambda free light chains: 12.2 mg/L (ref 5.7–26.3)

## 2022-09-16 LAB — MULTIPLE MYELOMA PANEL, SERUM
Albumin SerPl Elph-Mcnc: 3.7 g/dL (ref 2.9–4.4)
Albumin/Glob SerPl: 1.2 (ref 0.7–1.7)
Alpha 1: 0.3 g/dL (ref 0.0–0.4)
Alpha2 Glob SerPl Elph-Mcnc: 0.8 g/dL (ref 0.4–1.0)
B-Globulin SerPl Elph-Mcnc: 0.9 g/dL (ref 0.7–1.3)
Gamma Glob SerPl Elph-Mcnc: 1.3 g/dL (ref 0.4–1.8)
Globulin, Total: 3.3 g/dL (ref 2.2–3.9)
IgA: 152 mg/dL (ref 61–437)
IgG (Immunoglobin G), Serum: 1332 mg/dL (ref 603–1613)
IgM (Immunoglobulin M), Srm: 69 mg/dL (ref 20–172)
M Protein SerPl Elph-Mcnc: 0.6 g/dL — ABNORMAL HIGH
Total Protein ELP: 7 g/dL (ref 6.0–8.5)

## 2022-09-19 ENCOUNTER — Inpatient Hospital Stay (HOSPITAL_BASED_OUTPATIENT_CLINIC_OR_DEPARTMENT_OTHER): Payer: Medicare Other | Admitting: Hematology and Oncology

## 2022-09-19 DIAGNOSIS — R0602 Shortness of breath: Secondary | ICD-10-CM | POA: Diagnosis not present

## 2022-09-19 DIAGNOSIS — C9000 Multiple myeloma not having achieved remission: Secondary | ICD-10-CM | POA: Diagnosis not present

## 2022-09-19 DIAGNOSIS — E039 Hypothyroidism, unspecified: Secondary | ICD-10-CM | POA: Diagnosis not present

## 2022-09-19 NOTE — Progress Notes (Signed)
[place] [phone]   [phone]  TELEPHONE PROGRESS NOTE  Patient Care Team: Samuel Contras, [name] as PCP - General (Family Medicine)  Hematological/Oncological History # IgG Kappa Multiple Myeloma 02/02/2021:  Presented to [place] ED due to right sided flank tenderness with bruising. CT abdomen/pelvis: Multiple small lytic lesions in the thoracolumbar spine and bilateral pelvis -SPEP: IgG 2,082 (H), M Protein 1.8 (H). IFE shows IgG monoclonal protein with kappa light chain specificity.  -LDH 169, CBC normal, CMP normal except for sodium 131 (L), Chloride 96 (L).   02/14/2021: Establish care with Samuel Query PA-C 02/22/2021: bone marrow biopsy confirms the diagnosis of Multiple Myeloma with a monoclonal plasma cell population.  03/16/2021: Cycle 1 Day 1 of VRd chemotherapy  04/06/2021: Cycle 2 Day 1 of VRd chemotherapy  04/27/2021: Cycle 3 Day 1 of VRd chemotherapy  05/18/2021: Cycle 4 Day 1 of VRd chemotherapy  06/01/2021: drop dexamethasone to 34m PO weekly and start lasix due to shortness of breath.  06/15/2021: Desaturation to 87% on ambulation. HELD velcade today and sent to ED for evaluation.  06/22/2021: Findings consistent with drug reaction the lungs with eosinophils on BAL.  Given these findings we will definitely hold [name] and plan to avoid pomalidomide 06/29/2021: Cycle 1 Day 1 CyBorD chemotherapy 07/05/2021: HOLD chemotherapy given worsening lung function  Interval History:  [name] 66y.o. male with medical history significant for IgG Kappa multiple myeloma who presents for a follow up visit. The patient's last visit was on 09/10/2022 with Dr. DLorenso [name] He is accompanied by his wife for this telephone visit.   Mr. TSimonettireports he is "feeling vulnerable".  He reports that he is going through "a lot of lung stuff".  He notes that his allergies and asthma are flaring and he thinks this may be due to the dry leaves.  He had been doing quite  well until this week.  He reports that he has been very strict with the diet including spice mix with tumeric.  He reports that he has been trying to avoid sugars.  He reports that he does struggle mostly with fatigue, dizziness, and tiredness.  He reports that his thyroid numbers have been elevated with last TSH being 14.01 on 08/29/2022 with a low T4 at last check in June.  He notes despite his dizziness he has not had any falls.  He notes that he does feel better with exertion, and does enjoy exercising.  He denies fevers, chills, night sweats, nausea, vomiting, or diarrhea. He has no other complaints. Rest of the 10 point ROS is listed below.  MEDICAL HISTORY:  Past Medical History:  Diagnosis Date   Asthma    GERD (gastroesophageal reflux disease)    High cholesterol    under control.    History of blood transfusion    Hypothyroidism    Interstitial lung disease (HCC)    Multiple myeloma (HCC)    Perennial allergic rhinitis    Pneumonia    Sciatic pain, right    Seasonal allergic rhinitis    Thyroid disease     SURGICAL HISTORY: Past Surgical History:  Procedure Laterality Date   BRONCHIAL BIOPSY  06/18/2021   Procedure: BRONCHIAL BIOPSIES;  Surgeon: BCollene Gobble [name];  Location: [facility];  Service: Cardiopulmonary;;   BRONCHIAL BIOPSY  08/28/2021   Procedure: BRONCHIAL BIOPSIES;  Surgeon: Samuel Nash [name];  [place]  Service: Cardiopulmonary;;   BRONCHIAL WASHINGS  06/18/2021   Procedure: BRONCHIAL WASHINGS;  Surgeon: Samuel [name];  Location: [facility];  Service: Cardiopulmonary;;   BRONCHIAL WASHINGS  08/28/2021   Procedure: BRONCHIAL WASHINGS;  Surgeon: Samuel [name];  Location: [place];  Service: Cardiopulmonary;;   NASAL SINUS SURGERY     NECK SURGERY  1996   VIDEO BRONCHOSCOPY N/A 06/18/2021   Procedure: VIDEO BRONCHOSCOPY WITH FLUORO;  Surgeon: Samuel [name];  Location: [facility];  Service: Cardiopulmonary;  Laterality:  N/A;   VIDEO BRONCHOSCOPY N/A 08/28/2021   Procedure: VIDEO BRONCHOSCOPY WITH FLUORO;  Surgeon: Samuel [name];  Location: [place];  Service: Cardiopulmonary;  Laterality: N/A;    SOCIAL HISTORY: Social History   Socioeconomic History   Marital status: Married    Spouse name: Not on file   Number of children: 3   Years of education: Not on file   Highest education level: Not on file  Occupational History   Not on file  Tobacco Use   Smoking status: Former    Packs/day: 0.10    Years: 3.00    Total pack years: 0.30    Types: Cigarettes    Quit date: 10/21/1982    Years since quitting: 39.9   Smokeless tobacco: Never  Vaping Use   Vaping Use: Never used  Substance and Sexual Activity   Alcohol use: Not Currently    Comment: 1 drink daily   Drug use: No   Sexual activity: Not on file  Other Topics Concern   Not on file  Social History Narrative   Not on file   Social Determinants of Health   Financial Resource Strain: Not on file  Food Insecurity: Not on file  Transportation Needs: Not on file  Physical Activity: Not on file  Stress: Not on file  Social Connections: Not on file  Intimate Partner Violence: Not on file    FAMILY HISTORY: Family History  Problem Relation Age of Onset   Hypertension Mother    Allergies Mother    Allergies Father    CAD Father 5   Breast cancer Paternal Grandmother    Hypertension Other    Heart disease Other     ALLERGIES:  is allergic to clarithromycin, ofev [nintedanib], and penicillins.  MEDICATIONS:  Current Outpatient Medications  Medication Sig Dispense Refill   acyclovir (ZOVIRAX) 400 MG tablet Take 1 tablet (400 mg total) by mouth 2 (two) times daily. 60 tablet 3   albuterol (PROVENTIL) (2.5 MG/3ML) 0.083% nebulizer solution Take 3 mLs (2.5 mg total) by nebulization every 6 (six) hours as needed for wheezing or shortness of breath. 120 mL 12   albuterol (VENTOLIN HFA) 108 (90 Base) MCG/ACT inhaler Inhale 2  puffs into the lungs every 6 hours as needed for wheezing or shortness of breath. 8.5 g 1   ALPRAZolam (XANAX) 0.5 MG tablet Take 0.5 mg by mouth at bedtime.     ASSESS FULL RANGE PEAK METER DEVI as directed.     bisoprolol (ZEBETA) 5 MG tablet Take 1 tablet (5 mg total) by mouth daily. 30 tablet 2   cholecalciferol (VITAMIN D) 1000 UNITS tablet Take 3,000 Units by mouth daily.     Dextromethorphan HBr (DELSYM PO) Take by mouth as needed. For cough     EPINEPHrine (EPI-PEN) 0.3 mg/0.3 mL DEVI Inject 0.3 mg into the muscle as needed.     ezetimibe-simvastatin (VYTORIN) 10-40 MG per tablet Take 1 tablet by mouth at bedtime.     fluticasone (FLONASE) 50 MCG/ACT nasal spray 1 spray.  fluticasone-salmeterol (ADVAIR HFA) 230-21 MCG/ACT inhaler Inhale 2 puffs into the lungs 2 (two) times daily. Brand name only 1 each 12   furosemide (LASIX) 20 MG tablet TAKE ONE TABLET BY MOUTH AS NEEDED FOR FLUID OR EDEMA 14 tablet 1   ketoconazole (NIZORAL) 2 % cream SMARTSIG:1 Application Topical 1 to 2 Times Daily     montelukast (SINGULAIR) 10 MG tablet Take 10 mg by mouth at bedtime.     Multiple Vitamin (MULTIVITAMIN) tablet Take 1 tablet by mouth daily.     omeprazole (PRILOSEC) 40 MG capsule Take 1 capsule by mouth daily.     OXYGEN Inhale 2 L into the lungs continuous. As needed     sildenafil (REVATIO) 20 MG tablet Take 20 mg by mouth daily as needed (ED).     Simethicone (SIMETHICONE ULTRA STRENGTH) 180 MG CAPS Take 1 capsule (180 mg total) by mouth 3 (three) times daily as needed. 90 capsule 0   sodium chloride (OCEAN) 0.65 % SOLN nasal spray Place 1 spray into both nostrils as needed for congestion.     tamsulosin (FLOMAX) 0.4 MG CAPS capsule Take 0.4 mg by mouth daily.     thyroid (ARMOUR) 180 MG tablet Take 1 tablet by mouth daily.     triamcinolone cream (KENALOG) 0.1 % Apply 1 application topically daily as needed (sun burn itch).     No current facility-administered medications for this visit.     REVIEW OF SYSTEMS:   Constitutional: ( - ) fevers, ( - )  chills , ( - ) night sweats Eyes: ( - ) blurriness of vision, ( - ) double vision, ( - ) watery eyes Ears, nose, mouth, throat, and face: ( - ) mucositis, ( - ) sore throat Respiratory: ( - ) cough, ( + ) dyspnea, ( - ) wheezes Cardiovascular: ( - ) palpitation, ( - ) chest discomfort, ( - ) lower extremity swelling Gastrointestinal:  ( - ) nausea, ( - ) heartburn, ( - ) change in bowel habits Skin: ( - ) abnormal skin rashes Lymphatics: ( - ) new lymphadenopathy, ( - ) easy bruising Neurological: ( - ) numbness, ( - ) tingling, ( - ) new weaknesses Behavioral/Psych: ( - ) mood change, ( - ) new changes  All other systems were reviewed with the patient and are negative.  PHYSICAL EXAMINATION: ECOG PERFORMANCE STATUS: 1 - Symptomatic but completely ambulatory  TELEPHONE VISIT, no physical exam  LABORATORY DATA:  I have reviewed the data as listed    Latest Ref Rng & Units 09/10/2022   12:40 PM 08/02/2022    9:50 AM 07/03/2022    1:31 PM  CBC  WBC 4.0 - 10.5 K/uL 7.7  10.2  6.5   Hemoglobin 13.0 - 17.0 g/dL 13.7  14.1  14.1   Hematocrit 39.0 - 52.0 % 39.2  40.7  40.2   Platelets 150 - 400 K/uL 219  211  205        Latest Ref Rng & Units 09/10/2022   12:40 PM 08/02/2022    9:50 AM 07/03/2022    1:31 PM  CMP  Glucose 70 - 99 mg/dL 91  85  90   BUN 8 - 23 mg/dL _0 Creatinine 0.61 - 1.24 mg/dL 0.84  0.98  0.88   Sodium 135 - 145 mmol/L 130  131  129   Potassium 3.5 - 5.1 mmol/L 4.5  4.1  4.3   Chloride 98 -  111 mmol/L 97  98  95   CO2 22 - 32 mmol/L _0 Calcium 8.9 - 10.3 mg/dL 9.6  9.5  9.1   Total Protein 6.5 - 8.1 g/dL 7.8  7.1  7.3   Total Bilirubin 0.3 - 1.2 mg/dL 0.3  0.3  0.4   Alkaline Phos 38 - 126 U/L 47  39  37   AST 15 - 41 U/L _1 ALT 0 - 44 U/L _2 Lab Results  Component Value Date   MPROTEIN 0.6 (H) 09/10/2022   MPROTEIN 0.3 (H) 08/02/2022   MPROTEIN  0.3 (H) 07/03/2022   Lab Results  Component Value Date   KPAFRELGTCHN 29.5 (H) 09/10/2022   KPAFRELGTCHN 26.9 (H) 08/02/2022   KPAFRELGTCHN 23.5 (H) 07/03/2022   LAMBDASER 12.2 09/10/2022   LAMBDASER 10.1 08/02/2022   LAMBDASER 9.8 07/03/2022   KAPLAMBRATIO 2.42 (H) 09/10/2022   KAPLAMBRATIO 2.66 (H) 08/02/2022   KAPLAMBRATIO 2.40 (H) 07/03/2022    RADIOGRAPHIC STUDIES: I have personally reviewed the radiological images as listed and agreed with the findings in the report: lytic lesions in the hip bones bilaterally.  No results found.   ASSESSMENT & PLAN Samuel [name] 66 y.o. male with medical history significant for IgG Kappa multiple myeloma who presents for a follow up visit.   After review of the labs, review of the records, and discussion with the patient the findings are most consistent with an IgG kappa multiple myeloma.  The patient has 2 lytic lesions within the pelvic bones (more noted in spine on CT scan) as well as 10% plasma cells within the bone marrow biopsy.  This combined with his serological findings confirm the diagnosis of multiple myeloma.  The initial treatment of choice for this patient's multiple myeloma was VRd. This will consist of bortezomib 1.67m/m2 on days 1, 8, 15, dexamethasone 439mon days 1,8, and 15, and revlimid 2584mO daily days 1-14. Cycles will consist of 21 days. We will use this regimen to stabilize the patient's myeloma, then make a referral to a BMT center of his choosing for consideration of a bone marrow transplant once he has been noted to have a good response (VGPR or better). Zometa was started after dental clearance was obtained. This will be continued x 2 years.   Unfortunately had a drug reaction to [name] and was admitted to the hospital from 06/15/2021 until 06/18/2021.  The patient underwent a B AL which clearly showed evidence of eosinophils.  This is consistent with a drug reaction most likely caused by the patient's [name].   Discussed the case with pulmonology who recommended that we not rechallenge for attempt other drugs in the same class such as pomalidomide.  Given the patient's excellent [facility] might preference would be to continue that.  As such I would recommend we proceed with CyBorD chemotherapy.  Daratumumab-based therapy could have been considered, but is often paired with an immunologic.  Additionally I would like to preserve those for additional lines of therapy if necessary.  Therefore we will proceed with CyBorD chemotherapy have the patient referred to transplant.  R-IPSS score: Stage 2. PFS of 42 months  # IgG Kappa Multiple Myeloma (t11;14, standard risk)  --diagnostic criteria was met with monoclonal plasma cells in the bone marrow and lytic lesions on the bone --recommend proceeding with VRd chemotherapy as noted above --patient is young  and healthy enough for consideration of BMT, though he is borderline with his age. Will consider referral pending response to treatment.  -- Cycle 1 Day 1 started on 03/16/2021 --decreased dexamethasone to 61m PO weekly due to fluid overload. Also started lasix 221mPO (changed on 06/01/2021) --plan to HOLD revlimid moving forward after evidence of drug reaction in the lungs. --started CyBorD chemotherapy on 06/29/2021 --patient has reached a VGPR.  Plan: --Labs from last visit were reviewed.White blood cell count 7.7, hemoglobin 13.7, MCV 90.7, and platelets of 219. No cytopenias, renal dysfunction or hypercalcemia. MM panel and sFLC pending. --If M protein increases above 0.5 g/dL, recommend to resume therapy. Last level noted at 0.6.  We discussed this result in detail today. --[facility] recommended Dara/[name]/dexamethasone which Dr. DoLorenso Couriergrees is a reasonable regimen. Patient was aware of the risks and benefits regarding possible worsening of the lung function. --After detailed discussion with patient he would like to hold on  treatment at this time due to his frail lung condition.  We will reassess his labs again in 4 weeks time and determine the steps moving forward.  If this continues upward trend in his lung function improves he would be amenable to starting treatment. --RTC in 4 weeks with labs   #Fatigue: --Labs from today don't show clear etiology.  --Patient has chronic hyponatremia but doesn't explain recent symptoms. --Patient has chronic hypothyroidism and was told recently to increase dose. Patient would like a second opinion with endocrinologist. [facility]  Appointment has not yet been scheduled, will work to make this happen.  #Drug Reaction in the Lung --confirmed with increased eosinophils on BAL during admission for shortness of breath -- on pirfenidone and steroid taper managed by pulmonologist, Dr. RaChase Caller #Supportive Care -- chemotherapy education complete --zometa therapy started on 04/05/2021 (4 mg IV q 3 months), dose held today per patient's request. --ASA 8112mO daily for thromboprophylaxis on revlimid -- zofran 8mg24mH PRN and compazine 10mg50mq6H for nausea -- acyclovir 400mg 66mID for VCZ prophylaxis -- no pain medication required at this time.   No orders of the defined types were placed in this encounter.  All questions were answered. The patient knows to call the clinic with any problems, questions or concerns.  I have spent a total of 25 minutes minutes of face-to-face and non-face-to-face time, preparing to see the patienGerberically appropriate examination, counseling and educating the patient,  documenting clinical information in the electronic health record,  and care coordination.   Tisa Weisel TLedell Peoplesepartment of Hematology/Oncology [facility]: [phone]: [phone]: [email or web address]    09/19/2022 4:47 PM

## 2022-09-24 ENCOUNTER — Other Ambulatory Visit: Payer: Self-pay | Admitting: *Deleted

## 2022-09-24 DIAGNOSIS — J849 Interstitial pulmonary disease, unspecified: Secondary | ICD-10-CM

## 2022-09-26 ENCOUNTER — Encounter: Payer: Self-pay | Admitting: Nurse Practitioner

## 2022-09-26 ENCOUNTER — Ambulatory Visit: Payer: Medicare Other

## 2022-09-26 ENCOUNTER — Ambulatory Visit (INDEPENDENT_AMBULATORY_CARE_PROVIDER_SITE_OTHER): Payer: Medicare Other | Admitting: Nurse Practitioner

## 2022-09-26 ENCOUNTER — Telehealth: Payer: Self-pay | Admitting: *Deleted

## 2022-09-26 VITALS — BP 134/86 | HR 56 | Temp 98.4°F | Ht 70.0 in | Wt 214.8 lb

## 2022-09-26 DIAGNOSIS — B9689 Other specified bacterial agents as the cause of diseases classified elsewhere: Secondary | ICD-10-CM

## 2022-09-26 DIAGNOSIS — J4531 Mild persistent asthma with (acute) exacerbation: Secondary | ICD-10-CM

## 2022-09-26 DIAGNOSIS — J849 Interstitial pulmonary disease, unspecified: Secondary | ICD-10-CM

## 2022-09-26 DIAGNOSIS — J019 Acute sinusitis, unspecified: Secondary | ICD-10-CM | POA: Diagnosis not present

## 2022-09-26 DIAGNOSIS — J9611 Chronic respiratory failure with hypoxia: Secondary | ICD-10-CM

## 2022-09-26 DIAGNOSIS — J45901 Unspecified asthma with (acute) exacerbation: Secondary | ICD-10-CM

## 2022-09-26 MED ORDER — CEFPODOXIME PROXETIL 200 MG PO TABS: 200.0000 mg | ORAL_TABLET | Freq: Two times a day (BID) | ORAL | 0 refills | Status: AC

## 2022-09-26 MED ORDER — PREDNISONE 10 MG PO TABS: ORAL_TABLET | ORAL | 0 refills | Status: DC

## 2022-09-26 NOTE — Assessment & Plan Note (Signed)
Stable. He will continue to use oxygen as needed with activity for goal >88-90% and at night.

## 2022-09-26 NOTE — Assessment & Plan Note (Signed)
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 PFTs from October with improvement in spirometry; decline in DLCO when compared to August 2023 but improved when compared to May 2023. His symptom score was improved at our visit in October as well. He was supposed to have repeat PFTs today. We will reschedule these for 4 weeks from now given his acute symptoms. He was previously unable to tolerate full dose of Esbriet; may consider retrial

## 2022-09-26 NOTE — Patient Instructions (Addendum)
Continue Albuterol inhaler 2 puffs or 3 mL neb every 6 hours as needed for shortness of breath or wheezing. Notify if symptoms persist despite rescue inhaler/neb use.  Continue advair 2 puffs Twice daily. Brush tongue and rinse mouth afterwards  Continue singulair 1 tab At bedtime  Continue omeprazole 1 tab daily  Continue saline nasal spray 2-3 times a day  Continue supplemental oxygen 2 lpm at night and as needed with activity for goal >88-90%    Stay as active as possible; you're doing a great job  Cefpodoxime 200 mg Twice daily for 7 days. Take with food  Prednisone taper. 4 tabs for 2 days, then 3 tabs for 2 days, 2 tabs for 2 days, then 1 tab for 2 days, then stop. Take in AM with food Guaifenesin [phone] mg Twice daily for congestion Saline nasal irrigation 1-2 times a day. Follow this with flonase nasal spray 2 sprays each nostril in the morning.   Reschedule PFTs for 4 weeks   Follow up in 4 weeks DLCO/spirometry then with Dr. [name] or Alanson Aly. If symptoms do not improve or worsen, please contact office for sooner follow up or seek emergency care.

## 2022-09-26 NOTE — Telephone Encounter (Signed)
[name] patient regarding referral to Endocrinology. Spoke with pt. Advised that [facility] cannot take any Urgent referrals. Advised that I can send referral to [facility] on [address] [facility] [address]. He is agreeable to this. Asked pt to send he current/past thyroid levels as they are not available in our EMR. Pt states he will fax them tomorrow. Will fax referral to [phone] on 09/27/22.

## 2022-09-26 NOTE — Assessment & Plan Note (Signed)
Possible asthmatic bronchitis related to sinusitis. VS stable and non-toxic appearing. We will treat him with steroid taper. If symptoms do not improve or worsen, he will need further workup with chest imaging. Continue ICS/LABA therapy and PRN SABA. Asthma action plan in place. See above plan.

## 2022-09-26 NOTE — Progress Notes (Signed)
_0  ID: Billee Cashing, male    DOB: 1956-06-17, 66 y.o.   MRN: [id number]  No chief complaint on file.   Referring provider: Antony Contras, MD  HPI: 66 year old male, former remote smoker followed for ILD, drug-induced pneumonitis and chronic respiratory failure. He is a patient of Dr. Golden Pop and last seen in office 08/01/2022 by [facility] NP. Past medical history significant for allergic tinnitus, asthma, anxiety.  He has multiple myeloma stage II and is followed by Dr. [name].  TEST/EVENTS:  06/15/2021 PFT FVC 33, FEV1 37, ratio 83, TLC 59, DLCOcor 46.  No BD 06/16/2021 echo: EF 65-70%, LVH. RV size and function nl 07/30/2021 HRCT chest:Mediastinal lymph nodes are decreased in size when compared to prior.  Central airways are patent.  Mild diffuse groundglass opacity with peribronchovascular and subpleural reticular glass opacities as well as traction bronchiectasis.  No clear craniocaudal gradient.  There is mild bilateral air trapping.  There is possible honeycomb change of the anterior left upper lobe.  There is a stable solid right middle lobe pulmonary nodule measuring 3 mm.  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 10/16/2021 PFT: FVC 29, FEV1 36, ratio 92, DLCO corrected 37 12/10/2021 PFT: FVC 32, FEV1 40, ratio 94, DLCO corrected 34 01/06/2022 CTA chest: No evidence of PE.  There is cardiomegaly and a small pericardial effusion.  Lung volumes are small.  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  No acute process noted. 06/06/2022 PFT: FVC 30, FEV1 36, ratio 88, DLCO corrected for alveolar volume 51  06/06/2022: OV with Dr. [name]. Tapered off prednisone 2 weeks ago.  Around the same time, also started on pirfenidone at 2 pills 3 times a day.  Going to start 3 pills 3 times daily tomorrow.  Overall he is not feeling all that well.  Symptom scores of worsen.  Definitely feels more dyspneic.  Also having worsening cough.  No increased oxygen  requirements.  Having more fatigue and back pain.  He had an MRI with intrathecal steroid and after that back pain was improved.  Curious if all the symptoms are related to coming off prednisone.  His myeloma is recurring per his report with M protein spiking.  Scheduled to see Dr. [name] in September 2023 and get started on a new regimen recommended by [facility].  Walking desaturation test stable.  Pulmonary function testing is better than December 2022 but seems worse than May 2023 when he was at [facility].  Possibly related to prednisone taper.  Treated with short prednisone taper.  Spirometry/DLCO in 8 weeks.  Follow-up in 9 weeks with symptom score and walk test.  08/01/2022: OV with Keah Lamba NP for follow up with his wife. His spirometry today was improved when compared to August 2023; FVC 33% and FEV1 42%. He did have a decline in his DLCO to 39%. This is decreased from 51% in August 2023 but improved when compared to February 2023. He was on prednisone during his August testing. He has been doing well since we last saw him. He feels like he's come a long way since having COVID at the end of May. He's exercising regularly; using 10 lpm supplemental O2 when on the treadmill as he was previously recommended to do this during pulmonary rehab. He is trying to monitor what he eats and avoiding foods that cause increased reflux. He's also trying to eat more slowly, which he notices reduces chest pressure. His cough is unchanged since he was here  last. He did struggle with significant side effects from [place]; he discussed this with Dr. [name] and decided to stop antifibrotics. He would like to hold off on restarting at a lower dose for right now. He was told by [employer] that he will need to re-qualify for his oxygen.    SYMPTOM SCALE - ILD 08/09/2021 09/25/2021 218# 12/04/2021 222# 01/24/22 222# 04/09/2022 215# 06/06/2022 211# 08/01/2022 213lb  Current weight       Treadmill 2.2 mph.  As 1.4 miles over 40  minutes.  Uses 8 L oxygen Post may 2nd visit 03/27/22 -2ndcvoid     O2 use ra ra ra ra 2L eert 2L exert ra  Shortness of Breath 0 -> 5 scale with 5 being worst (score 6 If unable to do)             At rest 0 0 0 0 0 4 0  Simple tasks - showers, clothes change, eating, shaving _0 0/5 0 3 1  Household (dishes, doing bed, laundry) x na _1 Shopping 3 1 1.5 0/_2 Walking level at own pace _3 Walking up Stairs _4 3._5 Total (30-36) Dyspnea Score 14 9 8._6 How bad is your cough? 3 1 0.5   3._7 How bad is your fatigue 0 2 0   3   2  How bad is nausea 0 0 0   0 5 0  How bad is vomiting?   0 0 0   0 0 0  How bad is diarrhea? 0 0 0   0 4 0  How bad is anxiety? _8 0.5 0 1  How bad is depression _9 0.5 0 1  Any chronic pain - if so where and how bad x x x   x   x   09/26/2022: Today - follow up Patient presents today for intended follow up. He was suppose to have PFTs today as well but he is having some acute symptoms so would like to postpone. After he saw me, he developed some increased sinus congestion and drainage during the month of October. Related this to his allergies. He also started coughing a little bit more. His symptoms have persisted with minimal improvement despite nasal spray, advair use and singulair. He's still having green mucus drainage from his nose; feels stopped up. He's having postnasal drip, chest congestion, and congested but mostly non-productive cough. His breathing feels slightly more short. He denies fevers, chills, hemoptysis, wheezing, increased fatigue, orthopnea, leg swelling. No known sick exposures. Eating and drinking well.  He has been struggling with his thyroid levels recently. His daughter, who is a Marine scientist, advised him to take his medication first thing in the morning, on an empty stomach, and avoid taking medications around the same time. He has done this for the past four days and feels like his fatigue and  dizziness have actually gotten somewhat better. He's curious if his perceived side effects to Esbriet were actually just related to his thyroid levels being abnormal. He is considering restarting antifibrotic therapy but he wants to get his thyroid levels correct first and see what his next PFTs look like.   Allergies  Allergen Reactions   Clarithromycin Other (See Comments)    Hiccups   Ofev [Nintedanib] Other (See Comments)  GI bleeding   Penicillins Other (See Comments)    Child hood    Immunization History  Administered Date(s) Administered   Fluad Quad(high Dose 65+) 08/09/2021, 08/01/2022   Influenza Split 10/27/2017   Influenza, High Dose Seasonal PF 12/01/2017, 11/29/2019, 12/19/2020, 12/24/2021   Influenza, Quadrivalent, Recombinant, Inj, Pf 07/28/2019, 08/11/2020   Influenza-Unspecified 11/26/2011, 07/21/2017   PFIZER(Purple Top)SARS-COV-2 Vaccination 12/27/2019, 01/24/2020, 09/21/2020   Tdap 08/20/2005, 09/07/2015   Zoster, Live 11/06/2017, 05/07/2018    Past Medical History:  Diagnosis Date   Asthma    GERD (gastroesophageal reflux disease)    High cholesterol    under control.    History of blood transfusion    Hypothyroidism    Interstitial lung disease (HCC)    Multiple myeloma (HCC)    Perennial allergic rhinitis    Pneumonia    Sciatic pain, right    Seasonal allergic rhinitis    Thyroid disease     Tobacco History: Social History   Tobacco Use  Smoking Status Former   Packs/day: 0.10   Years: 3.00   Total pack years: 0.30   Types: Cigarettes   Quit date: 10/21/1982   Years since quitting: 39.9  Smokeless Tobacco Never   Counseling given: Not Answered   Outpatient Medications Prior to Visit  Medication Sig Dispense Refill   acyclovir (ZOVIRAX) 400 MG tablet Take 1 tablet (400 mg total) by mouth 2 (two) times daily. 60 tablet 3   albuterol (PROVENTIL) (2.5 MG/3ML) 0.083% nebulizer solution Take 3 mLs (2.5 mg total) by nebulization every 6  (six) hours as needed for wheezing or shortness of breath. 120 mL 12   albuterol (VENTOLIN HFA) 108 (90 Base) MCG/ACT inhaler Inhale 2 puffs into the lungs every 6 hours as needed for wheezing or shortness of breath. 8.5 g 1   ALPRAZolam (XANAX) 0.5 MG tablet Take 0.5 mg by mouth at bedtime.     ASSESS FULL RANGE PEAK METER DEVI as directed.     bisoprolol (ZEBETA) 5 MG tablet Take 1 tablet (5 mg total) by mouth daily. 30 tablet 2   cholecalciferol (VITAMIN D) 1000 UNITS tablet Take 3,000 Units by mouth daily.     Dextromethorphan HBr (DELSYM PO) Take by mouth as needed. For cough     EPINEPHrine (EPI-PEN) 0.3 mg/0.3 mL DEVI Inject 0.3 mg into the muscle as needed.     ezetimibe-simvastatin (VYTORIN) 10-40 MG per tablet Take 1 tablet by mouth at bedtime.     fluticasone (FLONASE) 50 MCG/ACT nasal spray 1 spray.     fluticasone-salmeterol (ADVAIR HFA) 230-21 MCG/ACT inhaler Inhale 2 puffs into the lungs 2 (two) times daily. Brand name only 1 each 12   furosemide (LASIX) 20 MG tablet TAKE ONE TABLET BY MOUTH AS NEEDED FOR FLUID OR EDEMA 14 tablet 1   ketoconazole (NIZORAL) 2 % cream SMARTSIG:1 Application Topical 1 to 2 Times Daily     montelukast (SINGULAIR) 10 MG tablet Take 10 mg by mouth at bedtime.     Multiple Vitamin (MULTIVITAMIN) tablet Take 1 tablet by mouth daily.     omeprazole (PRILOSEC) 40 MG capsule Take 1 capsule by mouth daily.     OXYGEN Inhale 2 L into the lungs continuous. As needed     sildenafil (REVATIO) 20 MG tablet Take 20 mg by mouth daily as needed (ED).     Simethicone (SIMETHICONE ULTRA STRENGTH) 180 MG CAPS Take 1 capsule (180 mg total) by mouth 3 (three) times daily as needed. 90 capsule  0   sodium chloride (OCEAN) 0.65 % SOLN nasal spray Place 1 spray into both nostrils as needed for congestion.     tamsulosin (FLOMAX) 0.4 MG CAPS capsule Take 0.4 mg by mouth daily.     thyroid (ARMOUR) 180 MG tablet Take 1 tablet by mouth daily.     triamcinolone cream (KENALOG)  0.1 % Apply 1 application topically daily as needed (sun burn itch).     No facility-administered medications prior to visit.     Review of Systems:   Constitutional: No weight loss or gain, night sweats, fevers, chills, or lassitude. +fatigue (baseline) HEENT: No headaches, difficulty swallowing, tooth/dental problems, or sore throat. No sneezing, itching, ear ache. +nasal congestion, purulent drainage, post nasal drip CV:  No chest pain, orthopnea, PND, swelling in lower extremities, anasarca, dizziness, palpitations, syncope Resp: +shortness of breath with exertion; congested cough; chest congestion. No excess mucus or change in color of mucus. No hemoptysis. No wheezing.  No chest wall deformity GI:  No heartburn, indigestion, abdominal pain, nausea, vomiting, diarrhea, change in bowel habits, loss of appetite, bloody stools.  Neuro: No dizziness or lightheadedness.  Psych: No depression or anxiety. Mood stable.     Physical Exam:  BP 134/86 (BP Location: Right Arm, Patient Position: Sitting, Cuff Size: Large)   Pulse (!) 56   Temp 98.4 F (36.9 C) (Oral)   Ht _0  (1.778 m)   Wt 214 lb 12.8 oz (97.4 kg)   SpO2 97%   BMI 30.82 kg/m   GEN: Pleasant, interactive, well-appearing; obese; in no acute distress. HEENT:  Normocephalic and atraumatic. PERRLA. Sclera white. Nasal turbinates erythematous, moist and patent bilaterally. White rhinorrhea present. Oropharynx pink and moist, without exudate or edema. No lesions, ulcerations, or postnasal drip.  NECK:  Supple w/ fair ROM. No JVD present. Normal carotid impulses w/o bruits. Thyroid symmetrical with no goiter or nodules palpated. No lymphadenopathy.   CV: RRR, no m/r/g, no peripheral edema. Pulses intact, +2 bilaterally. No cyanosis, pallor or clubbing. PULMONARY:  Unlabored, regular breathing. Clear bilaterally A&P w/o wheezes/rales/rhonchi. No accessory muscle use.  GI: BS present and normoactive. Soft, non-tender to  palpation. No organomegaly or masses detected.  MSK: No erythema, warmth or tenderness. No deformities or joint swelling noted.  Neuro: A/Ox3. No focal deficits noted.   Skin: Warm, no lesions or rashe Psych: Normal affect and behavior. Judgement and thought content appropriate.     Lab Results:  CBC    Component Value Date/Time   WBC 7.7 09/10/2022 1240   WBC 6.6 01/06/2022 0146   RBC 4.32 09/10/2022 1240   HGB 13.7 09/10/2022 1240   HCT 39.2 09/10/2022 1240   PLT 219 09/10/2022 1240   MCV 90.7 09/10/2022 1240   MCH 31.7 09/10/2022 1240   MCHC 34.9 09/10/2022 1240   RDW 12.4 09/10/2022 1240   LYMPHSABS 1.3 09/10/2022 1240   MONOABS 0.6 09/10/2022 1240   EOSABS 0.1 09/10/2022 1240   BASOSABS 0.0 09/10/2022 1240    BMET    Component Value Date/Time   NA 130 (L) 09/10/2022 1240   K 4.5 09/10/2022 1240   CL 97 (L) 09/10/2022 1240   CO2 29 09/10/2022 1240   GLUCOSE 91 09/10/2022 1240   BUN 17 09/10/2022 1240   CREATININE 0.84 09/10/2022 1240   CALCIUM 9.6 09/10/2022 1240   GFRNONAA >60 09/10/2022 1240    BNP    Component Value Date/Time   BNP 32.3 06/15/2021 1416     Imaging:  No results found.       Latest Ref Rng & Units 08/01/2022    1:57 PM 06/06/2022    1:52 PM 12/10/2021    9:24 AM 10/16/2021    3:56 PM 06/15/2021   10:13 PM  PFT Results  FVC-Pre L 1.55  1.43  1.49  1.35  1.55   FVC-Predicted Pre % 33  30  32  29  33   FVC-Post L     1.45   FVC-Predicted Post %     31   Pre FEV1/FVC % % 94  88  94  92  83   Post FEV1/FCV % %     83   FEV1-Pre L 1.46  1.26  1.40  1.24  1.28   FEV1-Predicted Pre % 42  36  40  36  37   FEV1-Post L     1.20   DLCO uncorrected ml/min/mmHg 10.43  13.19  8.64  9.99  12.00   DLCO UNC% % 39  49  32  37  44   DLCO corrected ml/min/mmHg 10.58  13.77  9.20  10.04  12.52   DLCO COR %Predicted % 39  51  34  37  46   DLVA Predicted % 110  128  97  109  118   TLC L     4.18   TLC % Predicted %     59   RV % Predicted %      95     No results found for: "NITRICOXIDE"      Assessment & Plan:   Acute bacterial rhinosinusitis Brought on by allergies with persistent symptoms. Sinus tenderness and inflamed nasal turbinates on exam. He has had no improvement with supportive care measures. Suspect that his chest congestion and cough are related to sinus drainage and postnasal drainage; possible asthmatic bronchitis flare as well. We will treat him with empiric cefpodoxime 7 day course. Target nasal symptoms with saline rinses followed by flonase. Encouraged him to use mucinex and keep taking sinuglair.   Patient Instructions  Continue Albuterol inhaler 2 puffs or 3 mL neb every 6 hours as needed for shortness of breath or wheezing. Notify if symptoms persist despite rescue inhaler/neb use.  Continue advair 2 puffs Twice daily. Brush tongue and rinse mouth afterwards  Continue singulair 1 tab At bedtime  Continue omeprazole 1 tab daily  Continue saline nasal spray 2-3 times a day  Continue supplemental oxygen 2 lpm at night and as needed with activity for goal >88-90%    Stay as active as possible; you're doing a great job  Cefpodoxime 200 mg Twice daily for 7 days. Take with food  Prednisone taper. 4 tabs for 2 days, then 3 tabs for 2 days, 2 tabs for 2 days, then 1 tab for 2 days, then stop. Take in AM with food Guaifenesin [phone] mg Twice daily for congestion Saline nasal irrigation 1-2 times a day. Follow this with flonase nasal spray 2 sprays each nostril in the morning.   Reschedule PFTs for 4 weeks   Follow up in 4 weeks DLCO/spirometry then with Dr. [name] or Alanson Aly. If symptoms do not improve or worsen, please contact office for sooner follow up or seek emergency care.   Asthmatic bronchitis with acute exacerbation Possible asthmatic bronchitis related to sinusitis. VS stable and non-toxic appearing. We will treat him with steroid taper. If symptoms do not improve or worsen, he will need  further workup with chest imaging.  Continue ICS/LABA therapy and PRN SABA. Asthma action plan in place. See above plan.  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 PFTs from October with improvement in spirometry; decline in DLCO when compared to August 2023 but improved when compared to May 2023. His symptom score was improved at our visit in October as well. He was supposed to have repeat PFTs today. We will reschedule these for 4 weeks from now given his acute symptoms. He was previously unable to tolerate full dose of Esbriet; may consider retrial   Chronic respiratory failure with hypoxia ([place]) Stable. He will continue to use oxygen as needed with activity for goal >88-90% and at night.      I spent 38 minutes of dedicated to the care of this patient on the date of this encounter to include pre-visit review of records, face-to-face time with the patient discussing conditions above, post visit ordering of testing, clinical documentation with the electronic health record, making appropriate referrals as documented, and communicating necessary findings to members of the patients care team.  Clayton Bibles, NP 09/26/2022  Pt aware and understands NP's role.

## 2022-09-26 NOTE — Assessment & Plan Note (Addendum)
Brought on by allergies with persistent symptoms. Sinus tenderness and inflamed nasal turbinates on exam. He has had no improvement with supportive care measures. Suspect that his chest congestion and cough are related to sinus drainage and postnasal drainage; possible asthmatic bronchitis flare as well. We will treat him with empiric cefpodoxime 7 day course. Target nasal symptoms with saline rinses followed by flonase. Encouraged him to use mucinex and keep taking sinuglair.   Patient Instructions  Continue Albuterol inhaler 2 puffs or 3 mL neb every 6 hours as needed for shortness of breath or wheezing. Notify if symptoms persist despite rescue inhaler/neb use.  Continue advair 2 puffs Twice daily. Brush tongue and rinse mouth afterwards  Continue singulair 1 tab At bedtime  Continue omeprazole 1 tab daily  Continue saline nasal spray 2-3 times a day  Continue supplemental oxygen 2 lpm at night and as needed with activity for goal >88-90%    Stay as active as possible; you're doing a great job  Cefpodoxime 200 mg Twice daily for 7 days. Take with food  Prednisone taper. 4 tabs for 2 days, then 3 tabs for 2 days, 2 tabs for 2 days, then 1 tab for 2 days, then stop. Take in AM with food Guaifenesin [phone] mg Twice daily for congestion Saline nasal irrigation 1-2 times a day. Follow this with flonase nasal spray 2 sprays each nostril in the morning.   Reschedule PFTs for 4 weeks   Follow up in 4 weeks DLCO/spirometry then with Dr. [name] or Alanson Aly. If symptoms do not improve or worsen, please contact office for sooner follow up or seek emergency care.

## 2022-10-08 ENCOUNTER — Other Ambulatory Visit (HOSPITAL_COMMUNITY): Payer: Self-pay

## 2022-10-09 ENCOUNTER — Other Ambulatory Visit: Payer: Medicare Other

## 2022-10-09 ENCOUNTER — Inpatient Hospital Stay: Payer: Medicare Other | Attending: Hematology and Oncology

## 2022-10-09 ENCOUNTER — Other Ambulatory Visit: Payer: Self-pay | Admitting: Hematology and Oncology

## 2022-10-09 ENCOUNTER — Ambulatory Visit: Payer: Medicare Other | Admitting: Hematology and Oncology

## 2022-10-09 DIAGNOSIS — E871 Hypo-osmolality and hyponatremia: Secondary | ICD-10-CM | POA: Insufficient documentation

## 2022-10-09 DIAGNOSIS — Z79899 Other long term (current) drug therapy: Secondary | ICD-10-CM | POA: Diagnosis not present

## 2022-10-09 DIAGNOSIS — Z79624 Long term (current) use of inhibitors of nucleotide synthesis: Secondary | ICD-10-CM | POA: Diagnosis not present

## 2022-10-09 DIAGNOSIS — E039 Hypothyroidism, unspecified: Secondary | ICD-10-CM | POA: Diagnosis not present

## 2022-10-09 DIAGNOSIS — Z7951 Long term (current) use of inhaled steroids: Secondary | ICD-10-CM | POA: Diagnosis not present

## 2022-10-09 DIAGNOSIS — Z87891 Personal history of nicotine dependence: Secondary | ICD-10-CM | POA: Diagnosis not present

## 2022-10-09 DIAGNOSIS — C9000 Multiple myeloma not having achieved remission: Secondary | ICD-10-CM | POA: Diagnosis present

## 2022-10-09 LAB — CMP (CANCER CENTER ONLY)
ALT: 22 U/L (ref 0–44)
AST: 24 U/L (ref 15–41)
Albumin: 3.9 g/dL (ref 3.5–5.0)
Alkaline Phosphatase: 50 U/L (ref 38–126)
Anion gap: 3 — ABNORMAL LOW (ref 5–15)
BUN: 22 mg/dL (ref 8–23)
CO2: 30 mmol/L (ref 22–32)
Calcium: 9.4 mg/dL (ref 8.9–10.3)
Chloride: 98 mmol/L (ref 98–111)
Creatinine: 1.1 mg/dL (ref 0.61–1.24)
GFR, Estimated: >60 mL/min (ref >60)
Glucose, Bld: 83 mg/dL (ref 70–99)
Potassium: 4.5 mmol/L (ref 3.5–5.1)
Sodium: 131 mmol/L — ABNORMAL LOW (ref 135–145)
Total Bilirubin: 0.4 mg/dL (ref 0.3–1.2)
Total Protein: 7.3 g/dL (ref 6.5–8.1)

## 2022-10-09 LAB — CBC WITH DIFFERENTIAL (CANCER CENTER ONLY)
Abs Immature Granulocytes: 0.01 10*3/uL (ref 0.00–0.07)
Basophils Absolute: 0 10*3/uL (ref 0.0–0.1)
Basophils Relative: 0 %
Eosinophils Absolute: 0.2 10*3/uL (ref 0.0–0.5)
Eosinophils Relative: 2 %
HCT: 39.6 % (ref 39.0–52.0)
Hemoglobin: 13.4 g/dL (ref 13.0–17.0)
Immature Granulocytes: 0 %
Lymphocytes Relative: 14 %
Lymphs Abs: 1.2 10*3/uL (ref 0.7–4.0)
MCH: 31.1 pg (ref 26.0–34.0)
MCHC: 33.8 g/dL (ref 30.0–36.0)
MCV: 91.9 fL (ref 80.0–100.0)
Monocytes Absolute: 0.7 10*3/uL (ref 0.1–1.0)
Monocytes Relative: 9 %
Neutro Abs: 6.4 10*3/uL (ref 1.7–7.7)
Neutrophils Relative %: 75 %
Platelet Count: 211 10*3/uL (ref 150–400)
RBC: 4.31 MIL/uL (ref 4.22–5.81)
RDW: 12.2 % (ref 11.5–15.5)
WBC Count: 8.6 10*3/uL (ref 4.0–10.5)
nRBC: 0 % (ref 0.0–0.2)

## 2022-10-09 LAB — LACTATE DEHYDROGENASE: LDH: 211 U/L — ABNORMAL HIGH (ref 98–192)

## 2022-10-10 ENCOUNTER — Other Ambulatory Visit: Payer: Self-pay

## 2022-10-10 LAB — KAPPA/LAMBDA LIGHT CHAINS
Kappa free light chain: 29.8 mg/L — ABNORMAL HIGH (ref 3.3–19.4)
Kappa, lambda light chain ratio: 2.64 — ABNORMAL HIGH (ref 0.26–1.65)
Lambda free light chains: 11.3 mg/L (ref 5.7–26.3)

## 2022-10-11 LAB — MULTIPLE MYELOMA PANEL, SERUM
Albumin SerPl Elph-Mcnc: 3.4 g/dL (ref 2.9–4.4)
Albumin/Glob SerPl: 1.1 (ref 0.7–1.7)
Alpha 1: 0.3 g/dL (ref 0.0–0.4)
Alpha2 Glob SerPl Elph-Mcnc: 0.8 g/dL (ref 0.4–1.0)
B-Globulin SerPl Elph-Mcnc: 0.9 g/dL (ref 0.7–1.3)
Gamma Glob SerPl Elph-Mcnc: 1.2 g/dL (ref 0.4–1.8)
Globulin, Total: 3.1 g/dL (ref 2.2–3.9)
IgA: 155 mg/dL (ref 61–437)
IgG (Immunoglobin G), Serum: 1372 mg/dL (ref 603–1613)
IgM (Immunoglobulin M), Srm: 80 mg/dL (ref 20–172)
Total Protein ELP: 6.5 g/dL (ref 6.0–8.5)

## 2022-10-15 ENCOUNTER — Telehealth: Payer: Self-pay | Admitting: *Deleted

## 2022-10-15 NOTE — Telephone Encounter (Signed)
Patient called requesting review of his labs that were completed on 10/09/2022.  He is also scheduled for labs and md visit tomorrow.  He wasn't sure why he needed those.    Dr [name] please advise.

## 2022-10-16 ENCOUNTER — Other Ambulatory Visit: Payer: Self-pay | Admitting: *Deleted

## 2022-10-16 ENCOUNTER — Inpatient Hospital Stay: Payer: Medicare Other

## 2022-10-16 ENCOUNTER — Inpatient Hospital Stay (HOSPITAL_BASED_OUTPATIENT_CLINIC_OR_DEPARTMENT_OTHER): Payer: Medicare Other | Admitting: Hematology and Oncology

## 2022-10-16 VITALS — BP 153/85 | HR 64 | Temp 97.4°F | Resp 17 | Wt 219.1 lb

## 2022-10-16 DIAGNOSIS — E039 Hypothyroidism, unspecified: Secondary | ICD-10-CM | POA: Diagnosis not present

## 2022-10-16 DIAGNOSIS — C9000 Multiple myeloma not having achieved remission: Secondary | ICD-10-CM

## 2022-10-16 DIAGNOSIS — R0602 Shortness of breath: Secondary | ICD-10-CM

## 2022-10-16 NOTE — Progress Notes (Signed)
[place] [phone]   [phone]  TELEPHONE PROGRESS NOTE  Patient Care Team: Antony Contras, MD as PCP - General (Family Medicine)  Hematological/Oncological History # IgG Kappa Multiple Myeloma 02/02/2021:  Presented to [facility] due to right sided flank tenderness with bruising. CT abdomen/pelvis: Multiple small lytic lesions in the thoracolumbar spine and bilateral pelvis -SPEP: IgG 2,082 (H), M Protein 1.8 (H). IFE shows IgG monoclonal protein with kappa light chain specificity.  -LDH 169, CBC normal, CMP normal except for sodium 131 (L), Chloride 96 (L).   02/14/2021: Establish care with Dede Query PA-C 02/22/2021: bone marrow biopsy confirms the diagnosis of Multiple Myeloma with a monoclonal plasma cell population.  03/16/2021: Cycle 1 Day 1 of VRd chemotherapy  04/06/2021: Cycle 2 Day 1 of VRd chemotherapy  04/27/2021: Cycle 3 Day 1 of VRd chemotherapy  05/18/2021: Cycle 4 Day 1 of VRd chemotherapy  06/01/2021: drop dexamethasone to 35m PO weekly and start lasix due to shortness of breath.  06/15/2021: Desaturation to 87% on ambulation. HELD velcade today and sent to ED for evaluation.  06/22/2021: Findings consistent with drug reaction the lungs with eosinophils on BAL.  Given these findings we will definitely hold [name] and plan to avoid pomalidomide 06/29/2021: Cycle 1 Day 1 CyBorD chemotherapy 07/05/2021: HOLD chemotherapy given worsening lung function  Interval History:  VCray [name] 66y.o. male with medical history significant for IgG Kappa multiple myeloma who presents for a follow up visit. The patient's last visit was on 09/19/2022 with Dr. DLorenso Courier He is accompanied by his wife for this visit.  [name] he has been taking it light and will not be doing any major celebrations for New Year's.  He reports he is having more congestion at this time and he feels like he has "gunk" in his lungs.  He notes this is different than his  usual lung issues.  He reports that he is coughing but nothing in particular is coming up.  He does have more drainage from his nose.  He notes that Mucinex does help with this.  He does feel like he is breathing better.  He unfortunately has not connected with endocrinology since we last talked.  He notes his energy levels are "okay".  And overall he thinks his energy is quite low.  He does sleep quite a lot.  He notes he continues to follow with Dr. RChase Callerand is [facility] anything on top of what we are doing here.  He denies fevers, chills, night sweats, nausea, vomiting, or diarrhea. He has no other complaints. Rest of the 10 point ROS is listed below.  MEDICAL HISTORY:  Past Medical History:  Diagnosis Date   Asthma    GERD (gastroesophageal reflux disease)    High cholesterol    under control.    History of blood transfusion    Hypothyroidism    Interstitial lung disease (HCC)    Multiple myeloma (HCC)    Perennial allergic rhinitis    Pneumonia    Sciatic pain, right    Seasonal allergic rhinitis    Thyroid disease     SURGICAL HISTORY: Past Surgical History:  Procedure Laterality Date   BRONCHIAL BIOPSY  06/18/2021   Procedure: BRONCHIAL BIOPSIES;  Surgeon: BCollene Gobble MD;  Location: [facility];  Service: Cardiopulmonary;;   BRONCHIAL BIOPSY  08/28/2021   Procedure: BRONCHIAL BIOPSIES;  Surgeon: IGarner Nash  DO;  Location: [facility];  Service: Cardiopulmonary;;   BRONCHIAL WASHINGS  06/18/2021   Procedure: BRONCHIAL WASHINGS;  Surgeon: Collene Gobble, MD;  Location: [facility];  Service: Cardiopulmonary;;   BRONCHIAL WASHINGS  08/28/2021   Procedure: BRONCHIAL WASHINGS;  Surgeon: Garner Nash, DO;  Location: [facility];  Service: Cardiopulmonary;;   NASAL SINUS SURGERY     NECK SURGERY  1996   VIDEO BRONCHOSCOPY N/A 06/18/2021   Procedure: VIDEO BRONCHOSCOPY WITH FLUORO;  Surgeon: Collene Gobble,  MD;  Location: [facility];  Service: Cardiopulmonary;  Laterality: N/A;   VIDEO BRONCHOSCOPY N/A 08/28/2021   Procedure: VIDEO BRONCHOSCOPY WITH FLUORO;  Surgeon: Garner Nash, DO;  Location: [facility];  Service: Cardiopulmonary;  Laterality: N/A;    SOCIAL HISTORY: Social History   Socioeconomic History   Marital status: Married    Spouse name: Not on file   Number of children: 3   Years of education: Not on file   Highest education level: Not on file  Occupational History   Not on file  Tobacco Use   Smoking status: Former    Packs/day: 0.10    Years: 3.00    Total pack years: 0.30    Types: Cigarettes    Quit date: 10/21/1982    Years since quitting: 40.0   Smokeless tobacco: Never  Vaping Use   Vaping Use: Never used  Substance and Sexual Activity   Alcohol use: Not Currently    Comment: 1 drink daily   Drug use: No   Sexual activity: Not on file  Other Topics Concern   Not on file  Social History Narrative   Not on file   Social Determinants of Health   Financial Resource Strain: Not on file  Food Insecurity: Not on file  Transportation Needs: Not on file  Physical Activity: Not on file  Stress: Not on file  Social Connections: Not on file  Intimate Partner Violence: Not on file    FAMILY HISTORY: Family History  Problem Relation Age of Onset   Hypertension Mother    Allergies Mother    Allergies Father    CAD Father 12   Breast cancer Paternal Grandmother    Hypertension Other    Heart disease Other     ALLERGIES:  is allergic to clarithromycin, ofev [nintedanib], and penicillins.  MEDICATIONS:  Current Outpatient Medications  Medication Sig Dispense Refill   acyclovir (ZOVIRAX) 400 MG tablet Take 1 tablet (400 mg total) by mouth 2 (two) times daily. 60 tablet 3   albuterol (PROVENTIL) (2.5 MG/3ML) 0.083% nebulizer solution Take 3 mLs (2.5 mg total) by nebulization every 6 (six) hours as needed for wheezing or shortness of breath. 120 mL  12   albuterol (VENTOLIN HFA) 108 (90 Base) MCG/ACT inhaler Inhale 2 puffs into the lungs every 6 hours as needed for wheezing or shortness of breath. 8.5 g 1   ALPRAZolam (XANAX) 0.5 MG tablet Take 0.5 mg by mouth at bedtime.     ASSESS FULL RANGE PEAK METER DEVI as directed.     bisoprolol (ZEBETA) 5 MG tablet Take 1 tablet (5 mg total) by mouth daily. 30 tablet 2   cholecalciferol (VITAMIN D) 1000 UNITS tablet Take 3,000 Units by mouth daily.     Dextromethorphan HBr (DELSYM PO) Take by mouth as needed. For cough     EPINEPHrine (EPI-PEN) 0.3 mg/0.3 mL DEVI Inject 0.3 mg into the muscle as needed.     ezetimibe-simvastatin (VYTORIN) 10-40 MG per  tablet Take 1 tablet by mouth at bedtime.     fluticasone (FLONASE) 50 MCG/ACT nasal spray 1 spray.     fluticasone-salmeterol (ADVAIR HFA) 230-21 MCG/ACT inhaler Inhale 2 puffs into the lungs 2 (two) times daily. Brand name only 1 each 12   furosemide (LASIX) 20 MG tablet TAKE ONE TABLET BY MOUTH AS NEEDED FOR FLUID OR EDEMA 14 tablet 1   ketoconazole (NIZORAL) 2 % cream SMARTSIG:1 Application Topical 1 to 2 Times Daily     montelukast (SINGULAIR) 10 MG tablet Take 10 mg by mouth at bedtime.     Multiple Vitamin (MULTIVITAMIN) tablet Take 1 tablet by mouth daily.     omeprazole (PRILOSEC) 40 MG capsule Take 1 capsule by mouth daily.     OXYGEN Inhale 2 L into the lungs continuous. As needed     predniSONE (DELTASONE) 10 MG tablet 4 tabs for 2 days, then 3 tabs for 2 days, 2 tabs for 2 days, then 1 tab for 2 days, then stop 20 tablet 0   sildenafil (REVATIO) 20 MG tablet Take 20 mg by mouth daily as needed (ED).     Simethicone (SIMETHICONE ULTRA STRENGTH) 180 MG CAPS Take 1 capsule (180 mg total) by mouth 3 (three) times daily as needed. 90 capsule 0   sodium chloride (OCEAN) 0.65 % SOLN nasal spray Place 1 spray into both nostrils as needed for congestion.     tamsulosin (FLOMAX) 0.4 MG CAPS capsule Take 0.4 mg by mouth daily.     thyroid (ARMOUR)  180 MG tablet Take 1 tablet by mouth daily.     triamcinolone cream (KENALOG) 0.1 % Apply 1 application topically daily as needed (sun burn itch).     No current facility-administered medications for this visit.    REVIEW OF SYSTEMS:   Constitutional: ( - ) fevers, ( - )  chills , ( - ) night sweats Eyes: ( - ) blurriness of vision, ( - ) double vision, ( - ) watery eyes Ears, nose, mouth, throat, and face: ( - ) mucositis, ( - ) sore throat Respiratory: ( - ) cough, ( + ) dyspnea, ( - ) wheezes Cardiovascular: ( - ) palpitation, ( - ) chest discomfort, ( - ) lower extremity swelling Gastrointestinal:  ( - ) nausea, ( - ) heartburn, ( - ) change in bowel habits Skin: ( - ) abnormal skin rashes Lymphatics: ( - ) new lymphadenopathy, ( - ) easy bruising Neurological: ( - ) numbness, ( - ) tingling, ( - ) new weaknesses Behavioral/Psych: ( - ) mood change, ( - ) new changes  All other systems were reviewed with the patient and are negative.  PHYSICAL EXAMINATION: ECOG PERFORMANCE STATUS: 1 - Symptomatic but completely ambulatory  TELEPHONE VISIT, no physical exam  LABORATORY DATA:  I have reviewed the data as listed    Latest Ref Rng & Units 10/09/2022   12:25 PM 09/10/2022   12:40 PM 08/02/2022    9:50 AM  CBC  WBC 4.0 - 10.5 K/uL 8.6  7.7  10.2   Hemoglobin 13.0 - 17.0 g/dL 13.4  13.7  14.1   Hematocrit 39.0 - 52.0 % 39.6  39.2  40.7   Platelets 150 - 400 K/uL 211  219  211        Latest Ref Rng & Units 10/09/2022   12:25 PM 09/10/2022   12:40 PM 08/02/2022    9:50 AM  CMP  Glucose 70 - 99 mg/dL 83  91  85   BUN 8 - 23 mg/dL _0 Creatinine 0.61 - 1.24 mg/dL 1.10  0.84  0.98   Sodium 135 - 145 mmol/L 131  130  131   Potassium 3.5 - 5.1 mmol/L 4.5  4.5  4.1   Chloride 98 - 111 mmol/L 98  97  98   CO2 22 - 32 mmol/L _1 Calcium 8.9 - 10.3 mg/dL 9.4  9.6  9.5   Total Protein 6.5 - 8.1 g/dL 7.3  7.8  7.1   Total Bilirubin 0.3 - 1.2 mg/dL 0.4  0.3  0.3    Alkaline Phos 38 - 126 U/L 50  47  39   AST 15 - 41 U/L _2 ALT 0 - 44 U/L _3 Lab Results  Component Value Date   MPROTEIN Comment (A) 10/09/2022   MPROTEIN 0.6 (H) 09/10/2022   MPROTEIN 0.3 (H) 08/02/2022   Lab Results  Component Value Date   KPAFRELGTCHN 29.8 (H) 10/09/2022   KPAFRELGTCHN 29.5 (H) 09/10/2022   KPAFRELGTCHN 26.9 (H) 08/02/2022   LAMBDASER 11.3 10/09/2022   LAMBDASER 12.2 09/10/2022   LAMBDASER 10.1 08/02/2022   KAPLAMBRATIO 2.64 (H) 10/09/2022   KAPLAMBRATIO 2.42 (H) 09/10/2022   KAPLAMBRATIO 2.66 (H) 08/02/2022    RADIOGRAPHIC STUDIES: I have personally reviewed the radiological images as listed and agreed with the findings in the report: lytic lesions in the hip bones bilaterally.  No results found.   ASSESSMENT & PLAN Cesareo Vickrey Audi 66 y.o. male with medical history significant for IgG Kappa multiple myeloma who presents for a follow up visit.   After review of the labs, review of the records, and discussion with the patient the findings are most consistent with an IgG kappa multiple myeloma.  The patient has 2 lytic lesions within the pelvic bones (more noted in spine on CT scan) as well as 10% plasma cells within the bone marrow biopsy.  This combined with his serological findings confirm the diagnosis of multiple myeloma.  The initial treatment of choice for this patient's multiple myeloma was VRd. This will consist of bortezomib 1.52m/m2 on days 1, 8, 15, dexamethasone 470mon days 1,8, and 15, and revlimid 2531mO daily days 1-14. Cycles will consist of 21 days. We will use this regimen to stabilize the patient's myeloma, then make a referral to a BMT center of his choosing for consideration of a bone marrow transplant once he has been noted to have a good response (VGPR or better). Zometa was started after dental clearance was obtained. This will be continued x 2 years.   Unfortunately had a drug reaction to [name] and was  admitted to the hospital from 06/15/2021 until 06/18/2021.  The patient underwent a B AL which clearly showed evidence of eosinophils.  This is consistent with a drug reaction most likely caused by the patient's [name].  Discussed the case with pulmonology who recommended that we not rechallenge for attempt other drugs in the same class such as pomalidomide.  [facility] preference would be to continue that.  As such I would recommend we proceed with CyBorD chemotherapy.  Daratumumab-based therapy could have been considered, but is often paired with an immunologic.  Additionally I would like to preserve those for additional lines of therapy if necessary.  Therefore we will proceed with  CyBorD chemotherapy have the patient referred to transplant.  R-IPSS score: Stage 2. PFS of 42 months  # IgG Kappa Multiple Myeloma (t11;14, standard risk)  --diagnostic criteria was met with monoclonal plasma cells in the bone marrow and lytic lesions on the bone --recommend proceeding with VRd chemotherapy as noted above --patient is young and healthy enough for consideration of BMT, though he is borderline with his age. Will consider referral pending response to treatment.  -- Cycle 1 Day 1 started on 03/16/2021 --decreased dexamethasone to 77m PO weekly due to fluid overload. Also started lasix 259mPO (changed on 06/01/2021) --plan to HOLD revlimid moving forward after evidence of drug reaction in the lungs. --started CyBorD chemotherapy on 06/29/2021 --patient has reached a VGPR.  Plan: --Labs from last visit were reviewed.White blood cell count 8.6, hemoglobin 13.4, MCV 91.9, and platelets of 211.. Marland Kitcheno cytopenias, renal dysfunction or hypercalcemia. MM panel and sFLC pending. --If M protein increases above 0.5 g/dL, recommend to resume therapy. Last level noted at 0.6.  We discussed this result in detail today. --[facility] recommended  Dara/[name]/dexamethasone which Dr. DoLorenso Couriergrees is a reasonable regimen. Patient was aware of the risks and benefits regarding possible worsening of the lung function. --After detailed discussion with patient he would like to hold on treatment at this time due to his frail lung condition.  We will reassess his labs again in 4 weeks time and determine the steps moving forward.  If this continues upward trend in his lung function improves he would be amenable to starting treatment. --RTC in 4 weeks with labs   #Fatigue: --Labs from today don't show clear etiology.  --Patient has chronic hyponatremia but doesn't explain recent symptoms. --Patient has chronic hypothyroidism and was told recently to increase dose. Patient would like a second opinion with endocrinologist. [facility]  Appointment has not yet been scheduled, will work to make this happen.  #Drug Reaction in the Lung --confirmed with increased eosinophils on BAL during admission for shortness of breath -- on pirfenidone and steroid taper managed by pulmonologist, Dr. RaChase Caller #Supportive Care -- chemotherapy education complete --zometa therapy started on 04/05/2021 (4 mg IV q 3 months), dose held today per patient's request. --ASA 8188mO daily for thromboprophylaxis on revlimid -- zofran 8mg4mH PRN and compazine 10mg33mq6H for nausea -- acyclovir 400mg 78mID for VCZ prophylaxis -- no pain medication required at this time.   No orders of the defined types were placed in this encounter.  All questions were answered. The patient knows to call the clinic with any problems, questions or concerns.  I have spent a total of 25 minutes minutes of face-to-face and non-face-to-face time, preparing to see the patienPeconicically appropriate examination, counseling and educating the patient,  documenting clinical information in the electronic health record,  and care coordination.   Landyn Lorincz TLedell PeoplesDepartment of Hematology/Oncology [facility]: [phone]: [phone]: [email or web address]    10/16/2022 5:03 PM

## 2022-10-17 ENCOUNTER — Telehealth: Payer: Self-pay | Admitting: Hematology and Oncology

## 2022-10-17 LAB — KAPPA/LAMBDA LIGHT CHAINS
Kappa free light chain: 34.6 mg/L — ABNORMAL HIGH (ref 3.3–19.4)
Kappa, lambda light chain ratio: 2.64 — ABNORMAL HIGH (ref 0.26–1.65)
Lambda free light chains: 13.1 mg/L (ref 5.7–26.3)

## 2022-10-17 NOTE — Telephone Encounter (Signed)
Called patient to notify of new appointments. Patient notified.  

## 2022-10-23 LAB — MULTIPLE MYELOMA PANEL, SERUM
Albumin SerPl Elph-Mcnc: 3.6 g/dL (ref 2.9–4.4)
Albumin/Glob SerPl: 1.2 (ref 0.7–1.7)
Alpha 1: 0.3 g/dL (ref 0.0–0.4)
Alpha2 Glob SerPl Elph-Mcnc: 0.8 g/dL (ref 0.4–1.0)
B-Globulin SerPl Elph-Mcnc: 0.8 g/dL (ref 0.7–1.3)
Gamma Glob SerPl Elph-Mcnc: 1.3 g/dL (ref 0.4–1.8)
Globulin, Total: 3.1 g/dL (ref 2.2–3.9)
IgA: 157 mg/dL (ref 61–437)
IgG (Immunoglobin G), Serum: 1368 mg/dL (ref 603–1613)
IgM (Immunoglobulin M), Srm: 70 mg/dL (ref 20–172)
M Protein SerPl Elph-Mcnc: 0.4 g/dL — ABNORMAL HIGH
Total Protein ELP: 6.7 g/dL (ref 6.0–8.5)

## 2022-10-25 ENCOUNTER — Telehealth: Payer: Self-pay | Admitting: *Deleted

## 2022-10-25 NOTE — Telephone Encounter (Signed)
[name] patient regarding recent lab results. Spoke with and advised that his SPEP has returned. His M protein is 0.4, much more consistent with what we were expecting compared to his prior labs.  Pt has seen these results and is very pleased with them. No questions or concerns right now.

## 2022-10-25 NOTE — Telephone Encounter (Signed)
-----   Message from Orson Slick, MD sent at 10/23/2022  2:38 PM EST ----- Please let Mr. Samuel [name] know that his SPEP has returned.  His M protein is 0.4, much more consistent with what we were expecting compared to his prior labs.  ----- Message ----- From: Interface, Lab In [place] Sent: 10/17/2022   3:37 PM EST To: Orson Slick, MD

## 2022-10-28 ENCOUNTER — Encounter: Payer: Self-pay | Admitting: Hematology and Oncology

## 2022-11-04 ENCOUNTER — Encounter: Payer: Self-pay | Admitting: Nurse Practitioner

## 2022-11-04 ENCOUNTER — Encounter: Payer: Self-pay | Admitting: Hematology and Oncology

## 2022-11-04 ENCOUNTER — Ambulatory Visit (INDEPENDENT_AMBULATORY_CARE_PROVIDER_SITE_OTHER): Payer: Medicare Other | Admitting: Nurse Practitioner

## 2022-11-04 ENCOUNTER — Ambulatory Visit (INDEPENDENT_AMBULATORY_CARE_PROVIDER_SITE_OTHER): Payer: Medicare Other | Admitting: Internal Medicine

## 2022-11-04 ENCOUNTER — Other Ambulatory Visit: Payer: Self-pay | Admitting: Hematology and Oncology

## 2022-11-04 VITALS — BP 112/78 | HR 58 | Ht 70.0 in | Wt 212.0 lb

## 2022-11-04 DIAGNOSIS — J9611 Chronic respiratory failure with hypoxia: Secondary | ICD-10-CM

## 2022-11-04 DIAGNOSIS — J4530 Mild persistent asthma, uncomplicated: Secondary | ICD-10-CM

## 2022-11-04 DIAGNOSIS — J849 Interstitial pulmonary disease, unspecified: Secondary | ICD-10-CM

## 2022-11-04 DIAGNOSIS — J329 Chronic sinusitis, unspecified: Secondary | ICD-10-CM | POA: Insufficient documentation

## 2022-11-04 LAB — PULMONARY FUNCTION TEST
DL/VA % pred: 107 %
DL/VA: 4.42 ml/min/mmHg/L
DLCO cor % pred: 37 %
DLCO cor: 9.96 ml/min/mmHg
DLCO unc % pred: 36 %
DLCO unc: 9.61 ml/min/mmHg
FEF 25-75 Pre: 1.16 L/sec
FEF2575-%Pred-Pre: 43 %
FEV1-%Pred-Pre: 32 %
FEV1-Pre: 1.12 L
FEV1FVC-%Pred-Pre: 113 %
FEV6-%Pred-Pre: 30 %
FEV6-Pre: 1.32 L
FEV6FVC-%Pred-Pre: 105 %
FVC-%Pred-Pre: 28 %
FVC-Pre: 1.32 L
Pre FEV1/FVC ratio: 85 %
Pre FEV6/FVC Ratio: 100 %

## 2022-11-04 LAB — POCT EXHALED NITRIC OXIDE: FeNO level (ppb): 10

## 2022-11-04 MED ORDER — CEFPODOXIME PROXETIL 200 MG PO TABS: 200.0000 mg | ORAL_TABLET | Freq: Two times a day (BID) | ORAL | 0 refills | Status: AC

## 2022-11-04 MED ORDER — AZELASTINE HCL 0.1 % NA SOLN: 2.0000 | Freq: Two times a day (BID) | NASAL | 5 refills | Status: DC

## 2022-11-04 NOTE — Progress Notes (Signed)
Spirometry/DLCO performed today

## 2022-11-04 NOTE — Assessment & Plan Note (Addendum)
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 PFTs from October with improvement in spirometry; decline in DLCO when compared to August 2023 but improved when compared to May 2023. His symptom score was improved at our visit in October as well. He had repeat PFTs today; overall declined when compared to October. Question if his acute symptoms are contributing. Plan to repeat HRCT to evaluated for progression of disease and reconsider antifibrotic therapy, which we discussed today. Repeat spiro/DLCO in 12 weeks.

## 2022-11-04 NOTE — Assessment & Plan Note (Addendum)
Recurrent sinusitis contributing to current symptoms. He previously improved with cefpodoxime 7 day course. We will treat him with longer duration and add on additional nasal spray with astelin. He has been compliant with nasal rinses and flonase. He is also on a daily antihistamine and singulair. Given his recurrent infections, we will send referral to ENT for further evaluation.   Patient Instructions  Continue Albuterol inhaler 2 puffs or 3 mL neb every 6 hours as needed for shortness of breath or wheezing. Notify if symptoms persist despite rescue inhaler/neb use.  Continue advair 2 puffs Twice daily. Brush tongue and rinse mouth afterwards  Continue singulair 1 tab At bedtime  Continue omeprazole 1 tab daily  Continue loratadine (Claritin) 1 tab daily Continue supplemental oxygen 2 lpm at night and as needed with activity for goal >88-90%    Cefpodoxime 200 mg Twice daily for 14 days. Take with food  Guaifenesin [phone] mg Twice daily for congestion Saline nasal irrigation 1-2 times a day. Follow this with flonase nasal spray 2 sprays each nostril in the morning and then astelin Twice daily  Astelin nasal spray 2 sprays each nostril Twice daily  Ok to use over the counter decongestant such as Sudafed for short period   High resolution CT chest  Referral to ENT   Follow up in 12 weeks DLCO/spirometry then with Dr. [name]. If symptoms do not improve or worsen, please contact office for sooner follow up or seek emergency care.

## 2022-11-04 NOTE — Assessment & Plan Note (Signed)
Stable without increased O2 requirement. Previous walk from 07/2022 was improved. He would prefer to wait until his next visit to repeat walk test due his sinus symptoms and cough today. He will continue to use 2 lpm at night and as needed with activity. Goal >88-90%

## 2022-11-04 NOTE — Progress Notes (Unsigned)
$'@Patient'e$  ID: Samuel [name], male    DOB: [birth year], 67 y.o.   MRN: [id number]  Chief Complaint  Patient presents with   Follow-up    Pt f/u after PFT, he states that he is a little better. Now has green nasal discharge, and white mucous. He states he feels like the nebulizer doesn't help him.     Referring provider: Antony Contras, MD  HPI: 67 year old male, former remote smoker followed for ILD, drug-induced pneumonitis and chronic respiratory failure. He is a patient of Dr. Golden Pop and last seen in office 09/26/2022 by [facility] NP. Past medical history significant for allergic tinnitus, asthma, anxiety.  He has multiple myeloma stage II and is followed by Dr. [name].  TEST/EVENTS:  06/15/2021 PFT FVC 33, FEV1 37, ratio 83, TLC 59, DLCOcor 46.  No BD 06/16/2021 echo: EF 65-70%, LVH. RV size and function nl 07/30/2021 HRCT chest:Mediastinal lymph nodes are decreased in size when compared to prior.  Central airways are patent.  Mild diffuse groundglass opacity with peribronchovascular and subpleural reticular glass opacities as well as traction bronchiectasis.  No clear craniocaudal gradient.  There is mild bilateral air trapping.  There is possible honeycomb change of the anterior left upper lobe.  There is a stable solid right middle lobe pulmonary nodule measuring 3 mm.  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 10/16/2021 PFT: FVC 29, FEV1 36, ratio 92, DLCO corrected 37 12/10/2021 PFT: FVC 32, FEV1 40, ratio 94, DLCO corrected 34 01/06/2022 CTA chest: No evidence of PE.  There is cardiomegaly and a small pericardial effusion.  Lung volumes are small.  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  No acute process noted. 06/06/2022 PFT: FVC 30, FEV1 36, ratio 88, DLCO corrected for alveolar volume 51 11/04/2022 PFT: FVC 28, FEV1 32, ratio 85, DLCOcor 37  06/06/2022: OV with Dr. [name]. Tapered off prednisone 2 weeks ago.  Around the same time, also started  on pirfenidone at 2 pills 3 times a day.  Going to start 3 pills 3 times daily tomorrow.  Overall he is not feeling all that well.  Symptom scores of worsen.  Definitely feels more dyspneic.  Also having worsening cough.  No increased oxygen requirements.  Having more fatigue and back pain.  He had an MRI with intrathecal steroid and after that back pain was improved.  Curious if all the symptoms are related to coming off prednisone.  His myeloma is recurring per his report with M protein spiking.  Scheduled to see Dr. [name] in September 2023 and get started on a new regimen recommended by [facility].  Walking desaturation test stable.  Pulmonary function testing is better than December 2022 but seems worse than May 2023 when he was at [facility].  Possibly related to prednisone taper.  Treated with short prednisone taper.  Spirometry/DLCO in 8 weeks.  Follow-up in 9 weeks with symptom score and walk test.  08/01/2022: OV with Nyx Keady NP for follow up with his wife. His spirometry today was improved when compared to August 2023; FVC 33% and FEV1 42%. He did have a decline in his DLCO to 39%. This is decreased from 51% in August 2023 but improved when compared to February 2023. He was on prednisone during his August testing. He has been doing well since we last saw him. He feels like he's come a long way since having COVID at the end of May. He's exercising regularly; using 10 lpm supplemental O2 when on  the treadmill as he was previously recommended to do this during pulmonary rehab. He is trying to monitor what he eats and avoiding foods that cause increased reflux. He's also trying to eat more slowly, which he notices reduces chest pressure. His cough is unchanged since he was here last. He did struggle with significant side effects from [place]; he discussed this with Dr. [name] and decided to stop antifibrotics. He would like to hold off on restarting at a lower dose for right now. He was told by [employer] that  he will need to re-qualify for his oxygen.    SYMPTOM SCALE - ILD 08/09/2021 09/25/2021 218# 12/04/2021 222# 01/24/22 222# 04/09/2022 215# 06/06/2022 211# 08/01/2022 213lb  Current weight       Treadmill 2.2 mph.  As 1.4 miles over 40 minutes.  Uses 8 L oxygen Post may 2nd visit 03/27/22 -2ndcvoid     O2 use ra ra ra ra 2L eert 2L exert ra  Shortness of Breath 0 -> 5 scale with 5 being worst (score 6 If unable to do)             At rest 0 0 0 0 0 4 0  Simple tasks - showers, clothes change, eating, shaving '2 2 1 '$ 0/5 0 3 1  Household (dishes, doing bed, laundry) x na '1 1 1 3 1  '$ Shopping 3 1 1.5 0/'5 1 3 1  '$ Walking level at own pace '4 2 2 1 1 2 1  '$ Walking up Stairs '5 4 3 '$ 3.'5 3 2 3  '$ Total (30-36) Dyspnea Score 14 9 8.'5 6 6 17   '$ How bad is your cough? 3 1 0.5   3.'4 5 3  '$ How bad is your fatigue 0 2 0   3   2  How bad is nausea 0 0 0   0 5 0  How bad is vomiting?   0 0 0   0 0 0  How bad is diarrhea? 0 0 0   0 4 0  How bad is anxiety? '5 2 1   '$ 0.5 0 1  How bad is depression '2 1 1   '$ 0.5 0 1  Any chronic pain - if so where and how bad x x x   x   x   09/26/2022: OV with Corliss Coggeshall NP for intended follow up. He was suppose to have PFTs today as well but he is having some acute symptoms so would like to postpone. After he saw me, he developed some increased sinus congestion and drainage during the month of October. Related this to his allergies. He also started coughing a little bit more. His symptoms have persisted with minimal improvement despite nasal spray, advair use and singulair. He's still having green mucus drainage from his nose; feels stopped up. He's having postnasal drip, chest congestion, and congested but mostly non-productive cough. His breathing feels slightly more short. He denies fevers, chills, hemoptysis, wheezing, increased fatigue, orthopnea, leg swelling. No known sick exposures. Eating and drinking well.  He has been struggling with his thyroid levels recently. His daughter, who is a  Marine scientist, advised him to take his medication first thing in the morning, on an empty stomach, and avoid taking medications around the same time. He has done this for the past four days and feels like his fatigue and dizziness have actually gotten somewhat better. He's curious if his perceived side effects to [place] were actually just related to his thyroid levels being abnormal. He  is considering restarting antifibrotic therapy but he wants to get his thyroid levels correct first and see what his next PFTs look like.   11/04/2022: Today - follow up Patient presents today for follow up after PFTs with his wife. He was supposed to have repeat PFT at our visit in December but we decided to postpone as he was experiencing acute symptoms. He completed 7 day course of cefpodoxime and symptoms resolved. Unfortunately, over the past few weeks, he has started to have nasal congestion again and purulent drainage. He has a lot of postnasal drainage and a congested cough. Feels like his chest congestion is coming from his sinuses. He's had sinus pressure and headaches. Denies fevers, chills, hemoptysis. Eating and drinking well. He doesn't necessarily feel more short winded.   He is still working on getting his thyroid levels corrected. His PCP referred him to endocrinology. He does feel like since he adjusted the timing of administration for his synthroid that he has started feeling better. Less sluggish and no more dizziness.   Allergies  Allergen Reactions   Clarithromycin Other (See Comments)    Hiccups   Ofev [Nintedanib] Other (See Comments)    GI bleeding   Penicillins Other (See Comments)    Child hood    Immunization History  Administered Date(s) Administered   Fluad Quad(high Dose 65+) 08/09/2021, 08/01/2022   Influenza Split 10/27/2017   Influenza, High Dose Seasonal PF 12/01/2017, 11/29/2019, 12/19/2020, 12/24/2021   Influenza, Quadrivalent, Recombinant, Inj, Pf 07/28/2019, 08/11/2020    Influenza-Unspecified 11/26/2011, 07/21/2017   PFIZER(Purple Top)SARS-COV-2 Vaccination 12/27/2019, 01/24/2020, 09/21/2020   Tdap 08/20/2005, 09/07/2015   Zoster, Live 11/06/2017, 05/07/2018    Past Medical History:  Diagnosis Date   Asthma    GERD (gastroesophageal reflux disease)    High cholesterol    under control.    History of blood transfusion    Hypothyroidism    Interstitial lung disease (HCC)    Multiple myeloma (HCC)    Perennial allergic rhinitis    Pneumonia    Sciatic pain, right    Seasonal allergic rhinitis    Thyroid disease     Tobacco History: Social History   Tobacco Use  Smoking Status Former   Packs/day: 0.10   Years: 3.00   Total pack years: 0.30   Types: Cigarettes   Quit date: 10/21/1982   Years since quitting: 40.0  Smokeless Tobacco Never   Counseling given: Not Answered   Outpatient Medications Prior to Visit  Medication Sig Dispense Refill   acyclovir (ZOVIRAX) 400 MG tablet Take 1 tablet (400 mg total) by mouth 2 (two) times daily. 60 tablet 3   albuterol (PROVENTIL) (2.5 MG/3ML) 0.083% nebulizer solution Take 3 mLs (2.5 mg total) by nebulization every 6 (six) hours as needed for wheezing or shortness of breath. 120 mL 12   albuterol (VENTOLIN HFA) 108 (90 Base) MCG/ACT inhaler Inhale 2 puffs into the lungs every 6 hours as needed for wheezing or shortness of breath. 8.5 g 1   ALPRAZolam (XANAX) 0.5 MG tablet Take 0.5 mg by mouth at bedtime.     ASSESS FULL RANGE PEAK METER DEVI as directed.     bisoprolol (ZEBETA) 5 MG tablet Take 1 tablet (5 mg total) by mouth daily. 30 tablet 2   cholecalciferol (VITAMIN D) 1000 UNITS tablet Take 3,000 Units by mouth daily.     Dextromethorphan HBr (DELSYM PO) Take by mouth as needed. For cough     dextromethorphan-guaiFENesin (MUCINEX DM) 30-600 MG 12hr  tablet Take 1 tablet by mouth 2 (two) times daily.     EPINEPHrine (EPI-PEN) 0.3 mg/0.3 mL DEVI Inject 0.3 mg into the muscle as needed.      ezetimibe-simvastatin (VYTORIN) 10-40 MG per tablet Take 1 tablet by mouth at bedtime.     fluticasone (FLONASE) 50 MCG/ACT nasal spray 1 spray.     fluticasone-salmeterol (ADVAIR HFA) 230-21 MCG/ACT inhaler Inhale 2 puffs into the lungs 2 (two) times daily. Brand name only 1 each 12   furosemide (LASIX) 20 MG tablet TAKE ONE TABLET BY MOUTH AS NEEDED FOR FLUID OR EDEMA 14 tablet 1   ketoconazole (NIZORAL) 2 % cream SMARTSIG:1 Application Topical 1 to 2 Times Daily     loratadine (CLARITIN) 10 MG tablet Take 10 mg by mouth daily.     montelukast (SINGULAIR) 10 MG tablet Take 10 mg by mouth at bedtime.     Multiple Vitamin (MULTIVITAMIN) tablet Take 1 tablet by mouth daily.     omeprazole (PRILOSEC) 40 MG capsule Take 1 capsule by mouth daily.     OXYGEN Inhale 2 L into the lungs continuous. As needed     PE-diphenhydrAMINE-DM-GG-APAP (DELSYM DAY NIGHT PO) Take by mouth in the morning and at bedtime.     predniSONE (DELTASONE) 10 MG tablet 4 tabs for 2 days, then 3 tabs for 2 days, 2 tabs for 2 days, then 1 tab for 2 days, then stop 20 tablet 0   sildenafil (REVATIO) 20 MG tablet Take 20 mg by mouth daily as needed (ED).     Simethicone (SIMETHICONE ULTRA STRENGTH) 180 MG CAPS Take 1 capsule (180 mg total) by mouth 3 (three) times daily as needed. 90 capsule 0   sodium chloride (OCEAN) 0.65 % SOLN nasal spray Place 1 spray into both nostrils as needed for congestion.     tamsulosin (FLOMAX) 0.4 MG CAPS capsule Take 0.4 mg by mouth daily.     thyroid (ARMOUR) 180 MG tablet Take 1 tablet by mouth daily.     triamcinolone cream (KENALOG) 0.1 % Apply 1 application topically daily as needed (sun burn itch).     No facility-administered medications prior to visit.     Review of Systems:   Constitutional: No weight loss or gain, night sweats, fevers, chills, or lassitude. +fatigue (baseline) HEENT: No headaches, difficulty swallowing, tooth/dental problems, or sore throat. No sneezing, itching, ear  ache. +nasal congestion, purulent drainage, post nasal drip CV:  No chest pain, orthopnea, PND, swelling in lower extremities, anasarca, dizziness, palpitations, syncope Resp: +shortness of breath with exertion; congested cough; chest congestion. No excess mucus or change in color of mucus. No hemoptysis. No wheezing.  No chest wall deformity GI:  No heartburn, indigestion, abdominal pain, nausea, vomiting, diarrhea, change in bowel habits, loss of appetite, bloody stools.  Neuro: No dizziness or lightheadedness.  Psych: No depression or anxiety. Mood stable.     Physical Exam:  BP 112/78   Pulse (!) 58   Ht '5\' 10"'$  (1.778 m)   Wt 212 lb (96.2 kg)   SpO2 97%   BMI 30.42 kg/m   GEN: Pleasant, interactive, well-appearing; obese; in no acute distress. HEENT:  Normocephalic and atraumatic. PERRLA. Sclera white. Nasal turbinates erythematous, moist and patent bilaterally. White rhinorrhea present. Oropharynx pink and moist, without exudate or edema. No lesions, ulcerations, or postnasal drip.  NECK:  Supple w/ fair ROM. No JVD present. Normal carotid impulses w/o bruits. Thyroid symmetrical with no goiter or nodules palpated. No lymphadenopathy.  CV: RRR, no m/r/g, no peripheral edema. Pulses intact, +2 bilaterally. No cyanosis, pallor or clubbing. PULMONARY:  Unlabored, regular breathing. Clear bilaterally A&P w/o wheezes/rales/rhonchi. No accessory muscle use.  GI: BS present and normoactive. Soft, non-tender to palpation. No organomegaly or masses detected.  MSK: No erythema, warmth or tenderness. No deformities or joint swelling noted.  Neuro: A/Ox3. No focal deficits noted.   Skin: Warm, no lesions or rashe Psych: Normal affect and behavior. Judgement and thought content appropriate.     Lab Results:  CBC    Component Value Date/Time   WBC 8.6 10/09/2022 1225   WBC 6.6 01/06/2022 0146   RBC 4.31 10/09/2022 1225   HGB 13.4 10/09/2022 1225   HCT 39.6 10/09/2022 1225   PLT 211  10/09/2022 1225   MCV 91.9 10/09/2022 1225   MCH 31.1 10/09/2022 1225   MCHC 33.8 10/09/2022 1225   RDW 12.2 10/09/2022 1225   LYMPHSABS 1.2 10/09/2022 1225   MONOABS 0.7 10/09/2022 1225   EOSABS 0.2 10/09/2022 1225   BASOSABS 0.0 10/09/2022 1225    BMET    Component Value Date/Time   NA 131 (L) 10/09/2022 1225   K 4.5 10/09/2022 1225   CL 98 10/09/2022 1225   CO2 30 10/09/2022 1225   GLUCOSE 83 10/09/2022 1225   BUN 22 10/09/2022 1225   CREATININE 1.10 10/09/2022 1225   CALCIUM 9.4 10/09/2022 1225   GFRNONAA >60 10/09/2022 1225    BNP    Component Value Date/Time   BNP 32.3 06/15/2021 1416     Imaging:  No results found.       Latest Ref Rng & Units 09/26/2022   10:34 AM 08/01/2022    1:57 PM 06/06/2022    1:52 PM 12/10/2021    9:24 AM 10/16/2021    3:56 PM 06/15/2021   10:13 PM  PFT Results  FVC-Pre L 1.32  P 1.55  1.43  1.49  1.35  1.55   FVC-Predicted Pre % 28  P 33  30  32  29  33   FVC-Post L      1.45   FVC-Predicted Post %      31   Pre FEV1/FVC % % 85  P 94  88  94  92  83   Post FEV1/FCV % %      83   FEV1-Pre L 1.12  P 1.46  1.26  1.40  1.24  1.28   FEV1-Predicted Pre % 32  P 42  36  40  36  37   FEV1-Post L      1.20   DLCO uncorrected ml/min/mmHg 9.61  P 10.43  13.19  8.64  9.99  12.00   DLCO UNC% % 36  P 39  49  32  37  44   DLCO corrected ml/min/mmHg 9.96  P 10.58  13.77  9.20  10.04  12.52   DLCO COR %Predicted % 37  P 39  51  34  37  46   DLVA Predicted % 107  P 110  128  97  109  118   TLC L      4.18   TLC % Predicted %      59   RV % Predicted %      95     P Preliminary result    No results found for: "NITRICOXIDE"      Assessment & Plan:   Recurrent sinusitis Recurrent sinusitis contributing to current symptoms. He  previously improved with cefpodoxime 7 day course. We will treat him with longer duration and add on additional nasal spray with astelin. He has been compliant with nasal rinses and flonase. He is also on a  daily antihistamine and singulair. Given his recurrent infections, we will send referral to ENT for further evaluation.   Patient Instructions  Continue Albuterol inhaler 2 puffs or 3 mL neb every 6 hours as needed for shortness of breath or wheezing. Notify if symptoms persist despite rescue inhaler/neb use.  Continue advair 2 puffs Twice daily. Brush tongue and rinse mouth afterwards  Continue singulair 1 tab At bedtime  Continue omeprazole 1 tab daily  Continue loratadine (Claritin) 1 tab daily Continue supplemental oxygen 2 lpm at night and as needed with activity for goal >88-90%    Cefpodoxime 200 mg Twice daily for 14 days. Take with food  Guaifenesin [phone] Twice daily for congestion Saline nasal irrigation 1-2 times a day. Follow this with flonase nasal spray 2 sprays each nostril in the morning and then astelin Twice daily  Astelin nasal spray 2 sprays each nostril Twice daily  Ok to use over the counter decongestant such as Sudafed for short period   High resolution CT chest  Referral to ENT   Follow up in 12 weeks DLCO/spirometry then with Dr. [name]. If symptoms do not improve or worsen, please contact office for sooner follow up or seek emergency care.   Mild persistent asthma Treated for acute exacerbation in December 2023 with steroids. Cough today seems to be related to postnasal drainage. FeNO was nl. He will continue ICS/LABA therapy and PRN SABA. Asthma action plan in place.  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 PFTs from October with improvement in spirometry; decline in DLCO when compared to August 2023 but improved when compared to May 2023. His symptom score was improved at our visit in October as well. He had repeat PFTs today; overall declined when compared to October. Question if his acute symptoms are contributing. Plan to repeat HRCT to evaluated for progression of  disease and reconsider antifibrotic therapy, which we discussed today. Repeat spiro/DLCO in 12 weeks.    Chronic respiratory failure with hypoxia (HCC) Stable without increased O2 requirement. Previous walk from 07/2022 was improved. He would prefer to wait until his next visit to repeat walk test due his sinus symptoms and cough today. He will continue to use 2 lpm at night and as needed with activity. Goal >88-90%   I spent 38 minutes of dedicated to the care of this patient on the date of this encounter to include pre-visit review of records, face-to-face time with the patient discussing conditions above, post visit ordering of testing, clinical documentation with the electronic health record, making appropriate referrals as documented, and communicating necessary findings to members of the patients care team.  Clayton Bibles, NP 11/04/2022  Pt aware and understands NP's role.

## 2022-11-04 NOTE — Patient Instructions (Signed)
Spirometry/DLCO performed today.

## 2022-11-04 NOTE — Patient Instructions (Addendum)
Continue Albuterol inhaler 2 puffs or 3 mL neb every 6 hours as needed for shortness of breath or wheezing. Notify if symptoms persist despite rescue inhaler/neb use.  Continue advair 2 puffs Twice daily. Brush tongue and rinse mouth afterwards  Continue singulair 1 tab At bedtime  Continue omeprazole 1 tab daily  Continue loratadine (Claritin) 1 tab daily Continue supplemental oxygen 2 lpm at night and as needed with activity for goal >88-90%    Cefpodoxime 200 mg Twice daily for 14 days. Take with food  Guaifenesin [phone] mg Twice daily for congestion Saline nasal irrigation 1-2 times a day. Follow this with flonase nasal spray 2 sprays each nostril in the morning and then astelin Twice daily  Astelin nasal spray 2 sprays each nostril Twice daily  Ok to use over the counter decongestant such as Sudafed for short period   High resolution CT chest  Referral to ENT   Follow up in 12 weeks DLCO/spirometry then with Dr. [name]. If symptoms do not improve or worsen, please contact office for sooner follow up or seek emergency care.

## 2022-11-04 NOTE — Assessment & Plan Note (Signed)
Treated for acute exacerbation in December 2023 with steroids. Cough today seems to be related to postnasal drainage. FeNO was nl. He will continue ICS/LABA therapy and PRN SABA. Asthma action plan in place.

## 2022-11-05 ENCOUNTER — Encounter: Payer: Self-pay | Admitting: Nurse Practitioner

## 2022-11-06 ENCOUNTER — Other Ambulatory Visit (HOSPITAL_COMMUNITY): Payer: Self-pay

## 2022-11-19 ENCOUNTER — Other Ambulatory Visit: Payer: Self-pay | Admitting: *Deleted

## 2022-11-19 DIAGNOSIS — C9000 Multiple myeloma not having achieved remission: Secondary | ICD-10-CM

## 2022-11-20 ENCOUNTER — Inpatient Hospital Stay: Payer: Medicare Other | Attending: Hematology and Oncology

## 2022-11-20 ENCOUNTER — Other Ambulatory Visit: Payer: Self-pay

## 2022-11-20 DIAGNOSIS — C9000 Multiple myeloma not having achieved remission: Secondary | ICD-10-CM

## 2022-11-20 LAB — CMP (CANCER CENTER ONLY)
ALT: 28 U/L (ref 0–44)
AST: 32 U/L (ref 15–41)
Albumin: 3.9 g/dL (ref 3.5–5.0)
Alkaline Phosphatase: 52 U/L (ref 38–126)
Anion gap: 8 (ref 5–15)
BUN: 22 mg/dL (ref 8–23)
CO2: 26 mmol/L (ref 22–32)
Calcium: 8.7 mg/dL — ABNORMAL LOW (ref 8.9–10.3)
Chloride: 96 mmol/L — ABNORMAL LOW (ref 98–111)
Creatinine: 1.04 mg/dL (ref 0.61–1.24)
GFR, Estimated: >60 mL/min (ref >60)
Glucose, Bld: 84 mg/dL (ref 70–99)
Potassium: 4.5 mmol/L (ref 3.5–5.1)
Sodium: 130 mmol/L — ABNORMAL LOW (ref 135–145)
Total Bilirubin: 0.5 mg/dL (ref 0.3–1.2)
Total Protein: 7.8 g/dL (ref 6.5–8.1)

## 2022-11-20 LAB — CBC WITH DIFFERENTIAL (CANCER CENTER ONLY)
Abs Immature Granulocytes: 0.01 10*3/uL (ref 0.00–0.07)
Basophils Absolute: 0 10*3/uL (ref 0.0–0.1)
Basophils Relative: 0 %
Eosinophils Absolute: 0.3 10*3/uL (ref 0.0–0.5)
Eosinophils Relative: 4 %
HCT: 39.3 % (ref 39.0–52.0)
Hemoglobin: 13.3 g/dL (ref 13.0–17.0)
Immature Granulocytes: 0 %
Lymphocytes Relative: 19 %
Lymphs Abs: 1.5 10*3/uL (ref 0.7–4.0)
MCH: 30 pg (ref 26.0–34.0)
MCHC: 33.8 g/dL (ref 30.0–36.0)
MCV: 88.7 fL (ref 80.0–100.0)
Monocytes Absolute: 0.6 10*3/uL (ref 0.1–1.0)
Monocytes Relative: 8 %
Neutro Abs: 5.1 10*3/uL (ref 1.7–7.7)
Neutrophils Relative %: 69 %
Platelet Count: 260 10*3/uL (ref 150–400)
RBC: 4.43 MIL/uL (ref 4.22–5.81)
RDW: 12 % (ref 11.5–15.5)
WBC Count: 7.5 10*3/uL (ref 4.0–10.5)
nRBC: 0 % (ref 0.0–0.2)

## 2022-11-20 LAB — LACTATE DEHYDROGENASE: LDH: 218 U/L — ABNORMAL HIGH (ref 98–192)

## 2022-11-21 LAB — KAPPA/LAMBDA LIGHT CHAINS
Kappa free light chain: 36.3 mg/L — ABNORMAL HIGH (ref 3.3–19.4)
Kappa, lambda light chain ratio: 2.4 — ABNORMAL HIGH (ref 0.26–1.65)
Lambda free light chains: 15.1 mg/L (ref 5.7–26.3)

## 2022-11-25 ENCOUNTER — Inpatient Hospital Stay: Payer: Medicare Other | Attending: Hematology and Oncology | Admitting: Hematology and Oncology

## 2022-11-25 ENCOUNTER — Ambulatory Visit (HOSPITAL_COMMUNITY)
Admission: RE | Admit: 2022-11-25 | Discharge: 2022-11-25 | Disposition: A | Discharge status: Home / Self Care | Payer: Medicare Other | Source: Ambulatory Visit | Attending: Nurse Practitioner | Admitting: Nurse Practitioner

## 2022-11-25 VITALS — BP 147/98 | HR 70 | Temp 97.6°F | Resp 15 | Wt 212.8 lb

## 2022-11-25 DIAGNOSIS — Z79624 Long term (current) use of inhibitors of nucleotide synthesis: Secondary | ICD-10-CM | POA: Diagnosis not present

## 2022-11-25 DIAGNOSIS — E039 Hypothyroidism, unspecified: Secondary | ICD-10-CM | POA: Diagnosis not present

## 2022-11-25 DIAGNOSIS — R918 Other nonspecific abnormal finding of lung field: Secondary | ICD-10-CM | POA: Diagnosis not present

## 2022-11-25 DIAGNOSIS — R0602 Shortness of breath: Secondary | ICD-10-CM

## 2022-11-25 DIAGNOSIS — Z79899 Other long term (current) drug therapy: Secondary | ICD-10-CM | POA: Insufficient documentation

## 2022-11-25 DIAGNOSIS — C9000 Multiple myeloma not having achieved remission: Secondary | ICD-10-CM | POA: Diagnosis present

## 2022-11-25 DIAGNOSIS — J849 Interstitial pulmonary disease, unspecified: Secondary | ICD-10-CM | POA: Insufficient documentation

## 2022-11-25 DIAGNOSIS — Z87891 Personal history of nicotine dependence: Secondary | ICD-10-CM | POA: Insufficient documentation

## 2022-11-25 DIAGNOSIS — Z7951 Long term (current) use of inhaled steroids: Secondary | ICD-10-CM | POA: Diagnosis not present

## 2022-11-25 LAB — MULTIPLE MYELOMA PANEL, SERUM
Albumin SerPl Elph-Mcnc: 3.6 g/dL (ref 2.9–4.4)
Albumin/Glob SerPl: 1.1 (ref 0.7–1.7)
Alpha 1: 0.3 g/dL (ref 0.0–0.4)
Alpha2 Glob SerPl Elph-Mcnc: 0.8 g/dL (ref 0.4–1.0)
B-Globulin SerPl Elph-Mcnc: 0.9 g/dL (ref 0.7–1.3)
Gamma Glob SerPl Elph-Mcnc: 1.5 g/dL (ref 0.4–1.8)
Globulin, Total: 3.5 g/dL (ref 2.2–3.9)
IgA: 200 mg/dL (ref 61–437)
IgG (Immunoglobin G), Serum: 1515 mg/dL (ref 603–1613)
IgM (Immunoglobulin M), Srm: 95 mg/dL (ref 20–172)
M Protein SerPl Elph-Mcnc: 0.3 g/dL — ABNORMAL HIGH
Total Protein ELP: 7.1 g/dL (ref 6.0–8.5)

## 2022-11-25 NOTE — Progress Notes (Signed)
Samuel [name] [phone]   [phone]  TELEPHONE PROGRESS NOTE  Patient Care Team: Samuel Contras, MD as PCP - General (Family Medicine)  Hematological/Oncological History # IgG Kappa Multiple Myeloma 02/02/2021:  Presented to [facility] ED due to right sided flank tenderness with bruising. CT abdomen/pelvis: Multiple small lytic lesions in the thoracolumbar spine and bilateral pelvis -SPEP: IgG 2,082 (H), M Protein 1.8 (H). IFE shows IgG monoclonal protein with kappa light chain specificity.  -LDH 169, CBC normal, CMP normal except for sodium 131 (L), Chloride 96 (L).   02/14/2021: Establish care with Samuel Query PA-C 02/22/2021: bone marrow biopsy confirms the diagnosis of Multiple Myeloma with a monoclonal plasma cell population.  03/16/2021: Cycle 1 Day 1 of VRd chemotherapy  04/06/2021: Cycle 2 Day 1 of VRd chemotherapy  04/27/2021: Cycle 3 Day 1 of VRd chemotherapy  05/18/2021: Cycle 4 Day 1 of VRd chemotherapy  06/01/2021: drop dexamethasone to '20mg'$  PO weekly and start lasix due to shortness of breath.  06/15/2021: Desaturation to 87% on ambulation. HELD velcade today and sent to ED for evaluation.  06/22/2021: Findings consistent with drug reaction the lungs with eosinophils on BAL.  Given these findings we will definitely hold [name] and plan to avoid pomalidomide 06/29/2021: Cycle 1 Day 1 CyBorD chemotherapy 07/05/2021: HOLD chemotherapy given worsening lung function  Interval History:  Samuel [name] 67 y.o. male with medical history significant for IgG Kappa multiple myeloma who presents for a follow up visit. The patient's last visit was on 10/16/2022 with Dr. Lorenso [name]. He is accompanied by his wife for this visit.  [name] reports he is having new congestion.  He reports that it is slightly different and worse than prior.  He thinks that he has a lot of mucus in his chest that he is simply unable to bring up.  He thinks his breathing and oxygenation  may be better but his wife is unsure.  He did have a CT scan at this morning and results are currently pending.  He reports that it was a dry cough previously and now he is bringing up more white phlegm.  He reports that his appetite is been good and his weight has been steady.  He does feel like his strength is diminished and he has more fatigue and less energy.  He reports that he did golfed 9 holes of golf on Saturday.  He reports that he is able to lay flat without difficulty.  He takes about a half Xanax at night to help him sleep.Marland Kitchen  He denies fevers, chills, night sweats, nausea, vomiting, or diarrhea. He has no other complaints. Rest of the 10 point ROS is listed below.  MEDICAL HISTORY:  Past Medical History:  Diagnosis Date   Asthma    GERD (gastroesophageal reflux disease)    High cholesterol    under control.    History of blood transfusion    Hypothyroidism    Interstitial lung disease (HCC)    Multiple myeloma (HCC)    Perennial allergic rhinitis    Pneumonia    Sciatic pain, right    Seasonal allergic rhinitis    Thyroid disease     SURGICAL HISTORY: Past Surgical History:  Procedure Laterality Date   BRONCHIAL BIOPSY  06/18/2021   Procedure: BRONCHIAL BIOPSIES;  Surgeon: Samuel Gobble, MD;  Location: [facility];  Service: Cardiopulmonary;;   BRONCHIAL BIOPSY  08/28/2021   Procedure: BRONCHIAL BIOPSIES;  Surgeon: Samuel Nash, DO;  Location: [place];  Service: Cardiopulmonary;;   BRONCHIAL WASHINGS  06/18/2021   Procedure: BRONCHIAL WASHINGS;  Surgeon: Samuel Gobble, MD;  Location: [facility];  Service: Cardiopulmonary;;   BRONCHIAL WASHINGS  08/28/2021   Procedure: BRONCHIAL WASHINGS;  Surgeon: Samuel Nash, DO;  Location: [place];  Service: Cardiopulmonary;;   NASAL SINUS SURGERY     NECK SURGERY  1996   VIDEO BRONCHOSCOPY N/A 06/18/2021   Procedure: VIDEO BRONCHOSCOPY WITH FLUORO;  Surgeon: Samuel Gobble, MD;  Location: [facility];  Service:  Cardiopulmonary;  Laterality: N/A;   VIDEO BRONCHOSCOPY N/A 08/28/2021   Procedure: VIDEO BRONCHOSCOPY WITH FLUORO;  Surgeon: Samuel Nash, DO;  Location: [facility];  Service: Cardiopulmonary;  Laterality: N/A;    SOCIAL HISTORY: Social History   Socioeconomic History   Marital status: Married    Spouse name: Not on file   Number of children: 3   Years of education: Not on file   Highest education level: Not on file  Occupational History   Not on file  Tobacco Use   Smoking status: Former    Packs/day: 0.10    Years: 3.00    Total pack years: 0.30    Types: Cigarettes    Quit date: 10/21/1982    Years since quitting: 40.1   Smokeless tobacco: Never  Vaping Use   Vaping Use: Never used  Substance and Sexual Activity   Alcohol use: Not Currently    Comment: 1 drink daily   Drug use: No   Sexual activity: Not on file  Other Topics Concern   Not on file  Social History Narrative   Not on file   Social Determinants of Health   Financial Resource Strain: Not on file  Food Insecurity: Not on file  Transportation Needs: Not on file  Physical Activity: Not on file  Stress: Not on file  Social Connections: Not on file  Intimate Partner Violence: Not on file    FAMILY HISTORY: Family History  Problem Relation Age of Onset   Hypertension Mother    Allergies Mother    Allergies Father    CAD Father 35   Breast cancer Paternal Grandmother    Hypertension Other    Heart disease Other     ALLERGIES:  is allergic to clarithromycin, ofev [nintedanib], and penicillins.  MEDICATIONS:  Current Outpatient Medications  Medication Sig Dispense Refill   acyclovir (ZOVIRAX) 400 MG tablet Take 1 tablet (400 mg total) by mouth 2 (two) times daily. 60 tablet 3   albuterol (PROVENTIL) (2.5 MG/3ML) 0.083% nebulizer solution Take 3 mLs (2.5 mg total) by nebulization every 6 (six) hours as needed for wheezing or shortness of breath. 120 mL 12   albuterol (VENTOLIN HFA) 108 (90  Base) MCG/ACT inhaler Inhale 2 puffs into the lungs every 6 hours as needed for wheezing or shortness of breath. 8.5 g 1   ALPRAZolam (XANAX) 0.5 MG tablet Take 0.5 mg by mouth at bedtime.     ASSESS FULL RANGE PEAK METER DEVI as directed.     azelastine (ASTELIN) 0.1 % nasal spray Place 2 sprays into both nostrils 2 (two) times daily. Use in each nostril as directed 30 mL 5   bisoprolol (ZEBETA) 5 MG tablet Take 1 tablet (5 mg total) by mouth daily. 30 tablet 2   cholecalciferol (VITAMIN D) 1000 UNITS tablet Take 3,000 Units by mouth daily.     Dextromethorphan HBr (DELSYM PO) Take by mouth as needed. For cough     dextromethorphan-guaiFENesin [facility]  DM) 30-600 MG 12hr tablet Take 1 tablet by mouth 2 (two) times daily.     EPINEPHrine (EPI-PEN) 0.3 mg/0.3 mL DEVI Inject 0.3 mg into the muscle as needed.     ezetimibe-simvastatin (VYTORIN) 10-40 MG per tablet Take 1 tablet by mouth at bedtime.     fluticasone (FLONASE) 50 MCG/ACT nasal spray 1 spray.     fluticasone-salmeterol (ADVAIR HFA) 230-21 MCG/ACT inhaler Inhale 2 puffs into the lungs 2 (two) times daily. Brand name only 1 each 12   furosemide (LASIX) 20 MG tablet TAKE ONE TABLET BY MOUTH AS NEEDED FOR FLUID OR EDEMA 14 tablet 1   ketoconazole (NIZORAL) 2 % cream SMARTSIG:1 Application Topical 1 to 2 Times Daily     loratadine (CLARITIN) 10 MG tablet Take 10 mg by mouth daily.     montelukast (SINGULAIR) 10 MG tablet Take 10 mg by mouth at bedtime.     Multiple Vitamin (MULTIVITAMIN) tablet Take 1 tablet by mouth daily.     omeprazole (PRILOSEC) 40 MG capsule Take 1 capsule by mouth daily.     OXYGEN Inhale 2 L into the lungs continuous. As needed     PE-diphenhydrAMINE-DM-GG-APAP (DELSYM DAY NIGHT PO) Take by mouth in the morning and at bedtime.     predniSONE (DELTASONE) 10 MG tablet 4 tabs for 2 days, then 3 tabs for 2 days, 2 tabs for 2 days, then 1 tab for 2 days, then stop 20 tablet 0   sildenafil (REVATIO) 20 MG tablet Take 20 mg  by mouth daily as needed (ED).     Simethicone (SIMETHICONE ULTRA STRENGTH) 180 MG CAPS Take 1 capsule (180 mg total) by mouth 3 (three) times daily as needed. 90 capsule 0   sodium chloride (OCEAN) 0.65 % SOLN nasal spray Place 1 spray into both nostrils as needed for congestion.     tamsulosin (FLOMAX) 0.4 MG CAPS capsule Take 0.4 mg by mouth daily.     thyroid (ARMOUR) 180 MG tablet Take 1 tablet by mouth daily.     triamcinolone cream (KENALOG) 0.1 % Apply 1 application topically daily as needed (sun burn itch).     No current facility-administered medications for this visit.    REVIEW OF SYSTEMS:   Constitutional: ( - ) fevers, ( - )  chills , ( - ) night sweats Eyes: ( - ) blurriness of vision, ( - ) double vision, ( - ) watery eyes Ears, nose, mouth, throat, and face: ( - ) mucositis, ( - ) sore throat Respiratory: ( - ) cough, ( + ) dyspnea, ( - ) wheezes Cardiovascular: ( - ) palpitation, ( - ) chest discomfort, ( - ) lower extremity swelling Gastrointestinal:  ( - ) nausea, ( - ) heartburn, ( - ) change in bowel habits Skin: ( - ) abnormal skin rashes Lymphatics: ( - ) new lymphadenopathy, ( - ) easy bruising Neurological: ( - ) numbness, ( - ) tingling, ( - ) new weaknesses Behavioral/Psych: ( - ) mood change, ( - ) new changes  All other systems were reviewed with the patient and are negative.  PHYSICAL EXAMINATION: ECOG PERFORMANCE STATUS: 1 - Symptomatic but completely ambulatory  TELEPHONE VISIT, no physical exam  LABORATORY DATA:  I have reviewed the data as listed    Latest Ref Rng & Units 11/20/2022    2:34 PM 10/09/2022   12:25 PM 09/10/2022   12:40 PM  CBC  WBC 4.0 - 10.5 K/uL 7.5  8.6  7.7  Hemoglobin 13.0 - 17.0 g/dL 13.3  13.4  13.7   Hematocrit 39.0 - 52.0 % 39.3  39.6  39.2   Platelets 150 - 400 K/uL 260  211  219        Latest Ref Rng & Units 11/20/2022    2:34 PM 10/09/2022   12:25 PM 09/10/2022   12:40 PM  CMP  Glucose 70 - 99 mg/dL 84  83   91   BUN 8 - 23 mg/dL '22  22  17   '$ Creatinine 0.61 - 1.24 mg/dL 1.04  1.10  0.84   Sodium 135 - 145 mmol/L 130  131  130   Potassium 3.5 - 5.1 mmol/L 4.5  4.5  4.5   Chloride 98 - 111 mmol/L 96  98  97   CO2 22 - 32 mmol/L '26  30  29   '$ Calcium 8.9 - 10.3 mg/dL 8.7  9.4  9.6   Total Protein 6.5 - 8.1 g/dL 7.8  7.3  7.8   Total Bilirubin 0.3 - 1.2 mg/dL 0.5  0.4  0.3   Alkaline Phos 38 - 126 U/L 52  50  47   AST 15 - 41 U/L 32  24  25   ALT 0 - 44 U/L '28  22  19     '$ Lab Results  Component Value Date   MPROTEIN 0.4 (H) 10/16/2022   MPROTEIN Comment (A) 10/09/2022   MPROTEIN 0.6 (H) 09/10/2022   Lab Results  Component Value Date   KPAFRELGTCHN 36.3 (H) 11/20/2022   KPAFRELGTCHN 34.6 (H) 10/16/2022   KPAFRELGTCHN 29.8 (H) 10/09/2022   LAMBDASER 15.1 11/20/2022   LAMBDASER 13.1 10/16/2022   LAMBDASER 11.3 10/09/2022   KAPLAMBRATIO 2.40 (H) 11/20/2022   KAPLAMBRATIO 2.64 (H) 10/16/2022   KAPLAMBRATIO 2.64 (H) 10/09/2022    RADIOGRAPHIC STUDIES: I have personally reviewed the radiological images as listed and agreed with the findings in the report: lytic lesions in the hip bones bilaterally.  No results found.   ASSESSMENT & PLAN Samuel [name] 67 y.o. male with medical history significant for IgG Kappa multiple myeloma who presents for a follow up visit.   After review of the labs, review of the records, and discussion with the patient the findings are most consistent with an IgG kappa multiple myeloma.  The patient has 2 lytic lesions within the pelvic bones (more noted in spine on CT scan) as well as 10% plasma cells within the bone marrow biopsy.  This combined with his serological findings confirm the diagnosis of multiple myeloma.  The initial treatment of choice for this patient's multiple myeloma was VRd. This will consist of bortezomib 1.'5mg'$ /m2 on days 1, 8, 15, dexamethasone '40mg'$  on days 1,8, and 15, and revlimid '25mg'$  PO daily days 1-14. Cycles will consist of 21 days.  We will use this regimen to stabilize the patient's myeloma, then make a referral to a BMT center of his choosing for consideration of a bone marrow transplant once he has been noted to have a good response (VGPR or better). Zometa was started after dental clearance was obtained. This will be continued x 2 years.   Unfortunately had a drug reaction to [name] and was admitted to the hospital from 06/15/2021 until 06/18/2021.  The patient underwent a B AL which clearly showed evidence of eosinophils.  This is consistent with a drug reaction most likely caused by the patient's [name].  Discussed the case with pulmonology who recommended that we not rechallenge for  attempt other drugs in the same class such as pomalidomide.  Given the patient's excellent response to [facility] therapy might preference would be to continue that.  As such I would recommend we proceed with CyBorD chemotherapy.  Daratumumab-based therapy could have been considered, but is often paired with an immunologic.  Additionally I would like to preserve those for additional lines of therapy if necessary.  Therefore we will proceed with CyBorD chemotherapy have the patient referred to transplant.  R-IPSS score: Stage 2. PFS of 42 months  # IgG Kappa Multiple Myeloma (t11;14, standard risk)  --diagnostic criteria was met with monoclonal plasma cells in the bone marrow and lytic lesions on the bone --recommend proceeding with VRd chemotherapy as noted above --patient is young and healthy enough for consideration of BMT, though he is borderline with his age. Will consider referral pending response to treatment.  -- Cycle 1 Day 1 started on 03/16/2021 --decreased dexamethasone to '20mg'$  PO weekly due to fluid overload. Also started lasix '20mg'$  PO (changed on 06/01/2021) --plan to HOLD revlimid moving forward after evidence of drug reaction in the lungs. --started CyBorD chemotherapy on 06/29/2021 --patient has reached a VGPR.  Plan: --Labs  from last visit were reviewed.White blood cell count 7.5, hemoglobin 13.3, MCV 88.7, and platelets of 260. No cytopenias, renal dysfunction or hypercalcemia. MM panel pending, SFLC stable.  --If M protein increases above 0.5 g/dL, recommend to resume therapy. Last level noted at 0.4.   --[facility] recommended Dara/[name]/dexamethasone which we agree is a reasonable regimen. Patient was aware of the risks and benefits regarding possible worsening of the lung function. --After detailed discussion with patient he would like to hold on treatment at this time due to his frail lung condition.  We will reassess his labs q 4 weeks and determine the steps moving forward.  If this continues upward trend in his lung function improves he would be amenable to starting treatment. --RTC in 12 weeks with interval 4 week with labs   #Fatigue: --Labs from today don't show clear etiology.  --Patient has chronic hyponatremia but doesn't explain recent symptoms. --Patient has chronic hypothyroidism and was told recently to increase dose. Patient would like a second opinion with endocrinologist. Sent referral to [facility] endocrinology.  Appointment has not yet been scheduled, will work to make this happen.  #Drug Reaction in the Lung --confirmed with increased eosinophils on BAL during admission for shortness of breath -- on pirfenidone and steroid taper managed by pulmonologist, Dr. [name].  #Supportive Care -- chemotherapy education complete --zometa therapy started on 04/05/2021 (4 mg IV q 3 months), dose held today per patient's request. --ASA '81mg'$  PO daily for thromboprophylaxis on revlimid -- zofran '8mg'$  q8H PRN and compazine '10mg'$  PO q6H for nausea -- acyclovir '400mg'$  PO BID for VCZ prophylaxis -- no pain medication required at this time.   No orders of the defined types were placed in this encounter.  All questions were answered. The patient knows to call the clinic with any problems, questions or  concerns.  I have spent a total of 25 minutes minutes of face-to-face and non-face-to-face time, preparing to see the [place] a medically appropriate examination, counseling and educating the patient,  documenting clinical information in the electronic health record,  and care coordination.   Ledell Peoples, MD Department of Hematology/Oncology [place] at [facility] Phone: [phone] Pager: [phone] Email: [email or web address]    11/25/2022 4:27 PM

## 2022-11-26 ENCOUNTER — Telehealth: Payer: Self-pay | Admitting: Internal Medicine

## 2022-11-27 ENCOUNTER — Telehealth: Payer: Self-pay

## 2022-11-27 NOTE — Telephone Encounter (Signed)
Discussed CT results with patient. He verbalized understanding. Nothing further needed at this time.

## 2022-11-27 NOTE — Telephone Encounter (Signed)
Patient wanted to make you aware that his CT scan results were back and results are in his mychart.

## 2022-11-27 NOTE — Telephone Encounter (Signed)
ATC X1 LVM for patient to call the office back 

## 2022-11-27 NOTE — Telephone Encounter (Signed)
Patient is requesting results from recent CT scan. Samuel [name] can you please advise?

## 2022-11-27 NOTE — Telephone Encounter (Signed)
Reviewed today. No evidence of progression of his ILD, which is good news. He actually showed some clearing of some of the ground glass areas noted on previous imaging, which could have been acute inflammation/infection. Still considered ILD but not consistent with IPF, at this time. Recommend we keep his PFT follow up as scheduled and see Dr. [name] afterwards. Thanks.

## 2022-11-27 NOTE — Telephone Encounter (Signed)
Patient is returning phone call. Patient phone number is [phone].

## 2022-11-28 ENCOUNTER — Ambulatory Visit: Payer: Medicare Other | Admitting: Cardiology

## 2022-11-28 ENCOUNTER — Encounter: Payer: Self-pay | Admitting: Cardiology

## 2022-11-28 ENCOUNTER — Encounter: Payer: Self-pay | Admitting: Hematology and Oncology

## 2022-11-28 VITALS — BP 142/77 | HR 58 | Ht 70.0 in | Wt 214.0 lb

## 2022-11-28 DIAGNOSIS — E782 Mixed hyperlipidemia: Secondary | ICD-10-CM | POA: Insufficient documentation

## 2022-11-28 DIAGNOSIS — I25118 Atherosclerotic heart disease of native coronary artery with other forms of angina pectoris: Secondary | ICD-10-CM

## 2022-11-28 DIAGNOSIS — I2510 Atherosclerotic heart disease of native coronary artery without angina pectoris: Secondary | ICD-10-CM | POA: Insufficient documentation

## 2022-11-28 MED ORDER — ROSUVASTATIN CALCIUM 20 MG PO TABS: 20.0000 mg | ORAL_TABLET | Freq: Every day | ORAL | 3 refills | Status: DC

## 2022-11-28 NOTE — Progress Notes (Signed)
Patient referred by Antony Contras, MD for cardiac risk stratification  Subjective:   Samuel [name], male    DOB: 1956-04-29, 67 y.o.   MRN: [id number]   Chief Complaint  Patient presents with   Follow-up   Coronary artery disease involving native coronary artery of     HPI  67 y/o Caucasian male with multiple myeloma, interstitial lung disease, drug-induced pneumonitis, coronary calcification  Patient was last seen by me 08/2021.  He has regular follow-up with pulmonology.  Residual disease is reportedly stable.  Stable unchanged exertional dyspnea with oxygen requirement.  For last 2 weeks, he is under current cough for which he is following with pulmonology.  Recent CT chest again showed severe coronary calcification, which she is concerned about.  Initial consultation visit 08/2021: Patient is going to undergo lung biopsy under general anesthesia. he was referred for cardiac risk stratification prior to the same.  Patient is here with his wife today.  He is a Music therapist, continues to work regularly.  He has been fairly active up until May 2022.  He was treated with [name] for multiple myeloma, with which his multiple myeloma is reportedly remission.  However, he developed relatively rapid onset of severe central dyspnea.  He has been seeing Dr. [name] since then with working diagnosis of interstitial lung disease versus drug-induced pneumonitis.  His CT scan has consistently showed bilateral groundglass opacities with peribronchovascular reticular opacities, traction bronchiectasis.  Differential reported on CT chest variance sequela of her acute lung injury, NSIP, or fibrotic HP.   There has been incidental findings of aortic calcification in LAD and RCA calcification.  Patient denies any chest pain symptoms.  Prior to May 2022, he did not have any significant exertional dyspnea.  BMP and NT proBNP has been normal the last few months.  Troponin was also normal.  He  smoked in college, none since then.  His father did have history of coronary artery disease requiring CABG.  Patient is currently on aspirin and statin multiple control lipids.     Current Outpatient Medications:    acyclovir (ZOVIRAX) 400 MG tablet, Take 1 tablet (400 mg total) by mouth 2 (two) times daily., Disp: 60 tablet, Rfl: 3   albuterol (PROVENTIL) (2.5 MG/3ML) 0.083% nebulizer solution, Take 3 mLs (2.5 mg total) by nebulization every 6 (six) hours as needed for wheezing or shortness of breath., Disp: 120 mL, Rfl: 12   albuterol (VENTOLIN HFA) 108 (90 Base) MCG/ACT inhaler, Inhale 2 puffs into the lungs every 6 hours as needed for wheezing or shortness of breath., Disp: 8.5 g, Rfl: 1   ALPRAZolam (XANAX) 0.5 MG tablet, Take 0.5 mg by mouth at bedtime., Disp: , Rfl:    ASSESS FULL RANGE PEAK METER DEVI, as directed., Disp: , Rfl:    azelastine (ASTELIN) 0.1 % nasal spray, Place 2 sprays into both nostrils 2 (two) times daily. Use in each nostril as directed, Disp: 30 mL, Rfl: 5   bisoprolol (ZEBETA) 5 MG tablet, Take 1 tablet (5 mg total) by mouth daily., Disp: 30 tablet, Rfl: 2   cholecalciferol (VITAMIN D) 1000 UNITS tablet, Take 3,000 Units by mouth daily., Disp: , Rfl:    Dextromethorphan HBr (DELSYM PO), Take by mouth as needed. For cough, Disp: , Rfl:    dextromethorphan-guaiFENesin (MUCINEX DM) 30-600 MG 12hr tablet, Take 1 tablet by mouth 2 (two) times daily., Disp: , Rfl:    EPINEPHrine (EPI-PEN) 0.3 mg/0.3 mL DEVI, Inject 0.3 mg into the  muscle as needed., Disp: , Rfl:    ezetimibe-simvastatin (VYTORIN) 10-40 MG per tablet, Take 1 tablet by mouth at bedtime., Disp: , Rfl:    fluticasone (FLONASE) 50 MCG/ACT nasal spray, 1 spray., Disp: , Rfl:    fluticasone-salmeterol (ADVAIR HFA) 230-21 MCG/ACT inhaler, Inhale 2 puffs into the lungs 2 (two) times daily. Brand name only, Disp: 1 each, Rfl: 12   furosemide (LASIX) 20 MG tablet, TAKE ONE TABLET BY MOUTH AS NEEDED FOR FLUID OR  EDEMA, Disp: 14 tablet, Rfl: 1   ketoconazole (NIZORAL) 2 % cream, SMARTSIG:1 Application Topical 1 to 2 Times Daily, Disp: , Rfl:    loratadine (CLARITIN) 10 MG tablet, Take 10 mg by mouth daily., Disp: , Rfl:    montelukast (SINGULAIR) 10 MG tablet, Take 10 mg by mouth at bedtime., Disp: , Rfl:    Multiple Vitamin (MULTIVITAMIN) tablet, Take 1 tablet by mouth daily., Disp: , Rfl:    omeprazole (PRILOSEC) 40 MG capsule, Take 1 capsule by mouth daily., Disp: , Rfl:    OXYGEN, Inhale 2 L into the lungs continuous. As needed, Disp: , Rfl:    PE-diphenhydrAMINE-DM-GG-APAP (DELSYM DAY NIGHT PO), Take by mouth in the morning and at bedtime., Disp: , Rfl:    predniSONE (DELTASONE) 10 MG tablet, 4 tabs for 2 days, then 3 tabs for 2 days, 2 tabs for 2 days, then 1 tab for 2 days, then stop, Disp: 20 tablet, Rfl: 0   sildenafil (REVATIO) 20 MG tablet, Take 20 mg by mouth daily as needed (ED)., Disp: , Rfl:    Simethicone (SIMETHICONE ULTRA STRENGTH) 180 MG CAPS, Take 1 capsule (180 mg total) by mouth 3 (three) times daily as needed., Disp: 90 capsule, Rfl: 0   sodium chloride (OCEAN) 0.65 % SOLN nasal spray, Place 1 spray into both nostrils as needed for congestion., Disp: , Rfl:    tamsulosin (FLOMAX) 0.4 MG CAPS capsule, Take 0.4 mg by mouth daily., Disp: , Rfl:    thyroid (ARMOUR) 180 MG tablet, Take 1 tablet by mouth daily., Disp: , Rfl:    triamcinolone cream (KENALOG) 0.1 %, Apply 1 application topically daily as needed (sun burn itch)., Disp: , Rfl:   Cardiovascular and other pertinent studies:  EKG 11/28/2022: Sinus rhythm 56 bpm LVH Borderline left atrial enlargement   CT Chest 11/25/2022: 1. Diffuse reticular opacities with associated traction bronchiectasis. No definite evidence of progression. Fibrotic fibrotic hypersensitivity is a possibility, although there is only mild air trapping. Diffuse ground-glass opacities described on July 30, 2021 and June 15, 2021 priors exam no longer  apparent and favored to be due to acute etiology. Findings are indeterminate for UIP per consensus guidelines: Diagnosis of Idiopathic Pulmonary Fibrosis: An Official ATS/ERS/JRS/ALAT Clinical Practice Guideline. [place], Iss 5, [phone], Jun 21 2017. 2. Moderate to severe coronary artery calcifications. 3. Unchanged mildly enlarged mediastinal and hilar lymph nodes, likely reactive. 4. Aortic Atherosclerosis (ICD10-I70.0).  Echocardiogram 06/16/2021:  1. Left ventricular ejection fraction, by estimation, is 65 to 70%. The  left ventricle has normal function. The left ventricle has no regional  wall motion abnormalities. There is mild left ventricular hypertrophy.  Left ventricular diastolic parameters are indeterminate.  Elevated left atrial pressure.   2. Right ventricular systolic function is normal. The right ventricular  size is normal. Tricuspid regurgitation signal is inadequate for assessing  PA pressure.   3. Trivial pericardial effusion is circumferential.   4. The mitral valve is normal in  structure. No evidence of mitral valve  regurgitation. No evidence of mitral stenosis.   5. The aortic valve has an indeterminant number of cusps. Aortic valve  regurgitation is not visualized. No aortic stenosis is present.   6. Aortic dilatation noted. There is mild dilatation of the ascending  aorta, measuring 38 mm.     Recent labs: 11/20/2022: Glucose 84, BUN/Cr 22/1.04. EGFR >60. Na/K 130/4.5. Rest of the CMP normal H/H 13/39. MCV 88. Platelets 260 TSH 0.1 normal  08/2022: Chol 185, TG 96, HDL 56, LDL 111  09/2021; HbA1C 6.2%    Review of Systems  Cardiovascular:  Positive for dyspnea on exertion. Negative for chest pain, leg swelling, palpitations and syncope.         Vitals:   11/28/22 1100  BP: (!) 142/77  Pulse: (!) 58  SpO2: 97%     Body mass index is 30.71 kg/m. Filed Weights   11/28/22 1100  Weight: 214 lb (97.1 kg)      Objective:   Physical Exam Vitals and nursing note reviewed.  Constitutional:      General: He is not in acute distress. Neck:     Vascular: No JVD.  Cardiovascular:     Rate and Rhythm: Normal rate and regular rhythm.     Heart sounds: Normal heart sounds. No murmur heard. Pulmonary:     Effort: Pulmonary effort is normal.     Breath sounds: Wheezing present. No rales.  Musculoskeletal:     Right lower leg: No edema.     Left lower leg: No edema.         Assessment & Recommendations:    67 y/o Caucasian male with multiple myeloma, interstitial lung disease, drug-induced pneumonitis, coronary calcification  Coronary calcification: Moderate cerebral identifications noted on CT chest 10/2022.  He has no anginal symptoms, denies exertional dyspnea.  Exertional dyspnea is most likely due to ILD, angina is also in the differential.  Recommend Lexiscan stress nuclear stress test, echocardiogram. In addition, recommend changing ezetimibe-simvastatin to rosuvastatin 20 mg daily.  Further recommendations after above testing    Nigel Mormon, MD Pager: [phone] Office: [phone]

## 2022-11-29 ENCOUNTER — Telehealth: Payer: Self-pay | Admitting: Hematology and Oncology

## 2022-11-29 NOTE — Telephone Encounter (Signed)
Per [name] PAL time rescheduled pt ; pt is aware and confirmed

## 2022-12-02 ENCOUNTER — Telehealth: Payer: Self-pay | Admitting: Internal Medicine

## 2022-12-02 DIAGNOSIS — R0609 Other forms of dyspnea: Secondary | ICD-10-CM

## 2022-12-02 DIAGNOSIS — R0989 Other specified symptoms and signs involving the circulatory and respiratory systems: Secondary | ICD-10-CM

## 2022-12-02 NOTE — Telephone Encounter (Signed)
Spoke to pt and he states he has nasal congestion with mucus that is green and chest congestion that when he coughs it up is clear. Would like to have a sputum test done. I informed pt I will send Dr [name] a message. Pt veralized understanding.

## 2022-12-02 NOTE — Telephone Encounter (Signed)
Ok tsend sputum gram stain and culture and also AFB Does he want to be seen by an app this week?

## 2022-12-02 NOTE — Telephone Encounter (Signed)
Spoke to pt and I put orders in for pt to have sputum and culture. Pt also wanted to be seen. I made him an appt. for first available with Rexene Edison, NP. Pt verbalized understanding. Nothing further needed.

## 2022-12-04 ENCOUNTER — Other Ambulatory Visit: Payer: Medicare Other

## 2022-12-04 ENCOUNTER — Encounter: Payer: Self-pay | Admitting: Adult Health

## 2022-12-04 ENCOUNTER — Ambulatory Visit (INDEPENDENT_AMBULATORY_CARE_PROVIDER_SITE_OTHER): Payer: Medicare Other | Admitting: Adult Health

## 2022-12-04 VITALS — BP 126/78 | HR 60 | Temp 97.6°F | Ht 70.0 in | Wt 215.6 lb

## 2022-12-04 DIAGNOSIS — I25118 Atherosclerotic heart disease of native coronary artery with other forms of angina pectoris: Secondary | ICD-10-CM | POA: Diagnosis not present

## 2022-12-04 DIAGNOSIS — B9689 Other specified bacterial agents as the cause of diseases classified elsewhere: Secondary | ICD-10-CM

## 2022-12-04 DIAGNOSIS — J019 Acute sinusitis, unspecified: Secondary | ICD-10-CM

## 2022-12-04 DIAGNOSIS — J4531 Mild persistent asthma with (acute) exacerbation: Secondary | ICD-10-CM | POA: Diagnosis not present

## 2022-12-04 DIAGNOSIS — R0989 Other specified symptoms and signs involving the circulatory and respiratory systems: Secondary | ICD-10-CM

## 2022-12-04 DIAGNOSIS — J849 Interstitial pulmonary disease, unspecified: Secondary | ICD-10-CM

## 2022-12-04 LAB — POCT INFLUENZA A/B
Influenza A, POC: NEGATIVE
Influenza B, POC: NEGATIVE

## 2022-12-04 LAB — CBC WITH DIFFERENTIAL/PLATELET
Basophils Absolute: 0 10*3/uL (ref 0.0–0.1)
Basophils Relative: 0.3 % (ref 0.0–3.0)
Eosinophils Absolute: 0.2 10*3/uL (ref 0.0–0.7)
Eosinophils Relative: 3.6 % (ref 0.0–5.0)
HCT: 40 % (ref 39.0–52.0)
Hemoglobin: 13.6 g/dL (ref 13.0–17.0)
Lymphocytes Relative: 22.1 % (ref 12.0–46.0)
Lymphs Abs: 1.2 10*3/uL (ref 0.7–4.0)
MCHC: 34.1 g/dL (ref 30.0–36.0)
MCV: 88.3 fl (ref 78.0–100.0)
Monocytes Absolute: 0.6 10*3/uL (ref 0.1–1.0)
Monocytes Relative: 10.6 % (ref 3.0–12.0)
Neutro Abs: 3.5 10*3/uL (ref 1.4–7.7)
Neutrophils Relative %: 63.4 % (ref 43.0–77.0)
Platelets: 283 10*3/uL (ref 150.0–400.0)
RBC: 4.53 Mil/uL (ref 4.22–5.81)
RDW: 12.9 % (ref 11.5–15.5)
WBC: 5.6 10*3/uL (ref 4.0–10.5)

## 2022-12-04 MED ORDER — BREZTRI AEROSPHERE 160-9-4.8 MCG/ACT IN AERO: 2.0000 | INHALATION_SPRAY | Freq: Two times a day (BID) | RESPIRATORY_TRACT | 5 refills | Status: DC

## 2022-12-04 MED ORDER — BENZONATATE 200 MG PO CAPS: 200.0000 mg | ORAL_CAPSULE | Freq: Three times a day (TID) | ORAL | 1 refills | Status: DC | PRN

## 2022-12-04 MED ORDER — BREZTRI AEROSPHERE 160-9-4.8 MCG/ACT IN AERO: 2.0000 | INHALATION_SPRAY | Freq: Two times a day (BID) | RESPIRATORY_TRACT | 0 refills | Status: DC

## 2022-12-04 MED ORDER — PREDNISONE 10 MG PO TABS: ORAL_TABLET | ORAL | 0 refills | Status: DC

## 2022-12-04 NOTE — Patient Instructions (Addendum)
Change Claritin to Allegra 159m daily  Add Chlropheniramine 449mAt bedtime  for drainage (can use during daytime but causes sedation)  Delsym 2 tsp Twice daily for cough As needed   Tessalon Three times a day  for cough As needed   Prednisone taper over next week  Sputum cultures  Change Advair to Breztri 2 puffs Twice daily  , rinse after use.  Albuterol inhaler /neb As needed   Continue on Singulair daily  Labs today .  Follow up with ENT as planned Follow up with Dr. RaChase Callern 6 weeks and As needed   Please contact office for sooner follow up if symptoms do not improve or worsen or seek emergency care

## 2022-12-04 NOTE — Progress Notes (Unsigned)
$@Patientt$  ID: Samuel [name], male    DOB: [birth year], 67 y.o.   MRN: [id number]  Chief Complaint  Patient presents with   Acute Visit    Referring provider: Antony Contras, MD  HPI: 67 year old male former smoker followed for interstitial lung disease, drug-induced pneumonitis (multiple myeloma drug tx) and chronic respiratory failure, and asthma Medical history significant for multiple myeloma, diagnosed in 2022 followed by oncology.  TEST/EVENTS :  06/15/2021 PFT FVC 33, FEV1 37, ratio 83, TLC 59, DLCOcor 46.  No BD 06/16/2021 echo: EF 65-70%, LVH. RV size and function nl 07/30/2021 HRCT chest:Mediastinal lymph nodes are decreased in size when compared to prior.  Central airways are patent.  Mild diffuse groundglass opacity with peribronchovascular and subpleural reticular glass opacities as well as traction bronchiectasis.  No clear craniocaudal gradient.  There is mild bilateral air trapping.  There is possible honeycomb change of the anterior left upper lobe.  There is a stable solid right middle lobe pulmonary nodule measuring 3 mm.  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 10/16/2021 PFT: FVC 29, FEV1 36, ratio 92, DLCO corrected 37 12/10/2021 PFT: FVC 32, FEV1 40, ratio 94, DLCO corrected 34 01/06/2022 CTA chest: No evidence of PE.  There is cardiomegaly and a small pericardial effusion.  Lung volumes are small.  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  No acute process noted. 06/06/2022 PFT: FVC 30, FEV1 36, ratio 88, DLCO corrected for alveolar volume 51 11/04/2022 PFT: FVC 28, FEV1 32, ratio 85, DLCOcor 37 High-resolution CT chest November 25, 2022 shows diffuse reticular opacities with associated traction bronchiectasis, no definite evidence of progression, previous diffuse groundglass opacities from October 2022 and August 2022 have resolved.  Findings are indeterminate for UIP  bronchoscopy performed in 06/18/2021.  Eosinophils 51%, lymphs 18%,  no malignant cells identified patient was started on a long-term prednisone taper. -Significant eosinophilia in the BAL the bronc is suggestive of drug-induced pneumonitis.   Esbriet stopped in October 2023 due to inability to tolerate due to GI side effects   # IgG Kappa Multiple Myeloma 02/22/2021: bone marrow biopsy confirms the diagnosis of Multiple Myeloma with a monoclonal plasma cell population.   12/04/2022 Follow up : ILD , drug-induced pneumonitis, chronic respiratory failure, asthma Patient returns for a 1 month follow-up.  Patient complains that he continues to have ongoing cough, congestion, nasal drainage, shortness of breath.  As above patient has underlying interstitial lung disease with drug-induced pneumonitis, felt secondary to medications used for treatment of his multiple myeloma.  Medications were stopped.  Patient was treated with a steroid challenge.  Patient did try Esbriet for brief time in fall 2023 but was unable to tolerate.  Patient also had COVID 19 infection in 2022 and 2023 and felt that his cough worsened after each episode. Patient's wife is also sick at home with an upper respiratory infection.  Has noticed that his cough is been worse over the last week.  Cough is very aggravating.  And affects his quality life.  Worse at night.  Patient does have a dog at home.  But says he is hypoallergenic.  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  Does not have a hot tub or basement.  No birds or chickens.  COVID-19 test and influenza test today in the office are negative.  Patient denies any hemoptysis, chest pain, orthopnea.  Does have some discolored nasal discharge as dark in the morning but clears as the day goes  on.  He had similar symptoms last month and was treated with a 14-day course of antibiotics.  He was also treated in December with antibiotics and steroids.  Patient has been referred to ENT and consult is pending.  He is currently taking  Advair twice daily.  Remains on Claritin daily.     Allergies  Allergen Reactions   Clarithromycin Other (See Comments)    Hiccups   Ofev [Nintedanib] Other (See Comments)    GI bleeding   Penicillins Other (See Comments)    Child hood unsure    Immunization History  Administered Date(s) Administered   Fluad Quad(high Dose 65+) 08/09/2021, 08/01/2022   Influenza Split 10/27/2017   Influenza, High Dose Seasonal PF 12/01/2017, 11/29/2019, 12/19/2020, 12/24/2021   Influenza, Quadrivalent, Recombinant, Inj, Pf 07/28/2019, 08/11/2020   Influenza-Unspecified 11/26/2011, 07/21/2017   PFIZER(Purple Top)SARS-COV-2 Vaccination 12/27/2019, 01/24/2020, 09/21/2020   Tdap 08/20/2005, 09/07/2015   Zoster, Live 11/06/2017, 05/07/2018    Past Medical History:  Diagnosis Date   Asthma    GERD (gastroesophageal reflux disease)    High cholesterol    under control.    History of blood transfusion    Hypothyroidism    Interstitial lung disease (HCC)    Multiple myeloma (HCC)    Perennial allergic rhinitis    Pneumonia    Sciatic pain, right    Seasonal allergic rhinitis    Thyroid disease     Tobacco History: Social History   Tobacco Use  Smoking Status Former   Packs/day: 0.10   Years: 3.00   Total pack years: 0.30   Types: Cigarettes   Quit date: 10/21/1982   Years since quitting: 40.1  Smokeless Tobacco Never   Counseling given: Not Answered   Outpatient Medications Prior to Visit  Medication Sig Dispense Refill   acyclovir (ZOVIRAX) 400 MG tablet Take 1 tablet (400 mg total) by mouth 2 (two) times daily. 60 tablet 3   albuterol (PROVENTIL) (2.5 MG/3ML) 0.083% nebulizer solution Take 3 mLs (2.5 mg total) by nebulization every 6 (six) hours as needed for wheezing or shortness of breath. 120 mL 12   albuterol (VENTOLIN HFA) 108 (90 Base) MCG/ACT inhaler Inhale 2 puffs into the lungs every 6 hours as needed for wheezing or shortness of breath. 8.5 g 1   ALPRAZolam (XANAX)  0.5 MG tablet Take 0.5 mg by mouth at bedtime.     azelastine (ASTELIN) 0.1 % nasal spray Place 2 sprays into both nostrils 2 (two) times daily. Use in each nostril as directed 30 mL 5   bisoprolol (ZEBETA) 5 MG tablet Take 1 tablet (5 mg total) by mouth daily. 30 tablet 2   cholecalciferol (VITAMIN D) 1000 UNITS tablet Take 3,000 Units by mouth daily.     Dextromethorphan HBr (DELSYM PO) Take by mouth as needed. For cough     dextromethorphan-guaiFENesin (MUCINEX DM) 30-600 MG 12hr tablet Take 1 tablet by mouth 2 (two) times daily.     EPINEPHrine (EPI-PEN) 0.3 mg/0.3 mL DEVI Inject 0.3 mg into the muscle as needed.     fluticasone (FLONASE) 50 MCG/ACT nasal spray 1 spray.     fluticasone-salmeterol (ADVAIR HFA) 230-21 MCG/ACT inhaler Inhale 2 puffs into the lungs 2 (two) times daily. Brand name only 1 each 12   furosemide (LASIX) 20 MG tablet TAKE ONE TABLET BY MOUTH AS NEEDED FOR FLUID OR EDEMA 14 tablet 1   ketoconazole (NIZORAL) 2 % cream SMARTSIG:1 Application Topical 1 to 2 Times Daily  loratadine (CLARITIN) 10 MG tablet Take 10 mg by mouth daily.     montelukast (SINGULAIR) 10 MG tablet Take 10 mg by mouth at bedtime.     Multiple Vitamin (MULTIVITAMIN) tablet Take 1 tablet by mouth daily.     omeprazole (PRILOSEC) 40 MG capsule Take 1 capsule by mouth daily.     OXYGEN Inhale 2 L into the lungs continuous. As needed     PE-diphenhydrAMINE-DM-GG-APAP (DELSYM DAY NIGHT PO) Take by mouth in the morning and at bedtime.     sildenafil (REVATIO) 20 MG tablet Take 20 mg by mouth daily as needed (ED).     Simethicone (SIMETHICONE ULTRA STRENGTH) 180 MG CAPS Take 1 capsule (180 mg total) by mouth 3 (three) times daily as needed. 90 capsule 0   sodium chloride (OCEAN) 0.65 % SOLN nasal spray Place 1 spray into both nostrils as needed for congestion.     tamsulosin (FLOMAX) 0.4 MG CAPS capsule Take 0.4 mg by mouth daily.     thyroid (ARMOUR) 120 MG tablet Take 120 mg by mouth daily before  breakfast.     triamcinolone cream (KENALOG) 0.1 % Apply 1 application topically daily as needed (sun burn itch).     ASSESS FULL RANGE PEAK METER DEVI as directed.     rosuvastatin (CRESTOR) 20 MG tablet Take 1 tablet (20 mg total) by mouth daily. (Patient not taking: Reported on 12/04/2022) 30 tablet 3   No facility-administered medications prior to visit.     Review of Systems:   Constitutional:   No  weight loss, night sweats,  Fevers, chills, fatigue, or  lassitude.  HEENT:   No headaches,  Difficulty swallowing,  Tooth/dental problems, or  Sore throat,                No sneezing, itching, ear ache,  +nasal congestion, post nasal drip,   CV:  No chest pain,  Orthopnea, PND, swelling in lower extremities, anasarca, dizziness, palpitations, syncope.   GI  No heartburn, indigestion, abdominal pain, nausea, vomiting, diarrhea, change in bowel habits, loss of appetite, bloody stools.   Resp: .  No chest wall deformity  Skin: no rash or lesions.  GU: no dysuria, change in color of urine, no urgency or frequency.  No flank pain, no hematuria   MS:  No joint pain or swelling.  No decreased range of motion.  No back pain.    Physical Exam  BP 126/78 (BP Location: Left Arm, Patient Position: Sitting, Cuff Size: Normal)   Pulse 60   Temp 97.6 F (36.4 C) (Oral)   Ht 5' 10"$  (1.778 m)   Wt 215 lb 9.6 oz (97.8 kg)   SpO2 94%   BMI 30.94 kg/m   GEN: A/Ox3; pleasant , NAD, well nourished    HEENT:  [place]/AT,   NOSE-clear, THROAT-clear, no lesions, no postnasal drip or exudate noted.   NECK:  Supple w/ fair ROM; no JVD; normal carotid impulses w/o bruits; no thyromegaly or nodules palpated; no lymphadenopathy.    RESP  Clear  P & A; w/o, wheezes/ rales/ or rhonchi. no accessory muscle use, no dullness to percussion  CARD:  RRR, no m/r/g, no peripheral edema, pulses intact, no cyanosis or clubbing.  GI:   Soft & nt; nml bowel sounds; no organomegaly or masses detected.   Musco:  Warm bil, no deformities or joint swelling noted.   Neuro: alert, no focal deficits noted.    Skin: Warm, no lesions or rashes  Lab Results:  CBC    Component Value Date/Time   WBC 7.5 11/20/2022 1434   WBC 6.6 01/06/2022 0146   RBC 4.43 11/20/2022 1434   HGB 13.3 11/20/2022 1434   HCT 39.3 11/20/2022 1434   PLT 260 11/20/2022 1434   MCV 88.7 11/20/2022 1434   MCH 30.0 11/20/2022 1434   MCHC 33.8 11/20/2022 1434   RDW 12.0 11/20/2022 1434   LYMPHSABS 1.5 11/20/2022 1434   MONOABS 0.6 11/20/2022 1434   EOSABS 0.3 11/20/2022 1434   BASOSABS 0.0 11/20/2022 1434    BMET    Component Value Date/Time   NA 130 (L) 11/20/2022 1434   K 4.5 11/20/2022 1434   CL 96 (L) 11/20/2022 1434   CO2 26 11/20/2022 1434   GLUCOSE 84 11/20/2022 1434   BUN 22 11/20/2022 1434   CREATININE 1.04 11/20/2022 1434   CALCIUM 8.7 (L) 11/20/2022 1434   GFRNONAA >60 11/20/2022 1434    BNP    Component Value Date/Time   BNP 32.3 06/15/2021 1416    ProBNP    Component Value Date/Time   PROBNP 47 07/20/2021 1123    Imaging: CT CHEST HIGH RESOLUTION  Result Date: 11/25/2022 CLINICAL DATA:  Interstitial lung disease EXAM: CT CHEST WITHOUT CONTRAST TECHNIQUE: Multidetector CT imaging of the chest was performed following the standard protocol without intravenous contrast. High resolution imaging of the lungs, as well as inspiratory and expiratory imaging, was performed. RADIATION DOSE REDUCTION: This exam was performed according to the departmental dose-optimization program which includes automated exposure control, adjustment of the mA and/or kV according to patient size and/or use of iterative reconstruction technique. COMPARISON:  CT angio dated January 06, 2022; high-resolution chest CT dated July 30, 2021; CT angio dated June 15, 2021 FINDINGS: Cardiovascular: Normal heart size. No pericardial effusion. Normal caliber thoracic aorta with mild calcified plaque. Moderate severe coronary  artery calcifications. Mediastinum/Nodes: Esophagus thyroid are unremarkable. Unchanged mildly enlarged mediastinal and hilar lymph nodes. Reference left hilar lymph node measuring 10 mm on series 4, image 47, unchanged when compared with the prior exam. Lungs/Pleura: Central airways are patent. Mild bilateral air trapping. Subpleural and peribronchovascular reticular opacities with traction bronchiectasis. No clear craniocaudal predominance. Possible honeycomb change versus paraseptal emphysema of the left upper lobe. No definite evidence of progressive fibrotic changes when compared with July 30, 2021 prior. Upper Abdomen: No acute abnormality. Musculoskeletal: No chest wall mass or suspicious bone lesions identified. IMPRESSION: 1. Diffuse reticular opacities with associated traction bronchiectasis. No definite evidence of progression. Fibrotic fibrotic hypersensitivity is a possibility, although there is only mild air trapping. Diffuse ground-glass opacities described on July 30, 2021 and June 15, 2021 priors exam no longer apparent and favored to be due to acute etiology. Findings are indeterminate for UIP per consensus guidelines: Diagnosis of Idiopathic Pulmonary Fibrosis: An Official ATS/ERS/JRS/ALAT Clinical Practice Guideline. [place], Iss 5, [phone], Jun 21 2017. 2. Moderate to severe coronary artery calcifications. 3. Unchanged mildly enlarged mediastinal and hilar lymph nodes, likely reactive. 4. Aortic Atherosclerosis (ICD10-I70.0). Electronically Signed   By: Yetta Glassman M.D.   On: 11/25/2022 20:22         Latest Ref Rng & Units 09/26/2022   10:34 AM 08/01/2022    1:57 PM 06/06/2022    1:52 PM 12/10/2021    9:24 AM 10/16/2021    3:56 PM 06/15/2021   10:13 PM  PFT Results  FVC-Pre L 1.32  1.55  1.43  1.49  1.35  1.55   FVC-Predicted Pre % 28  33  30  32  29  33   FVC-Post L      1.45   FVC-Predicted Post %      31   Pre FEV1/FVC % % 85  94  88  94   92  83   Post FEV1/FCV % %      83   FEV1-Pre L 1.12  1.46  1.26  1.40  1.24  1.28   FEV1-Predicted Pre % 32  42  36  40  36  37   FEV1-Post L      1.20   DLCO uncorrected ml/min/mmHg 9.61  10.43  13.19  8.64  9.99  12.00   DLCO UNC% % 36  39  49  32  37  44   DLCO corrected ml/min/mmHg 9.96  10.58  13.77  9.20  10.04  12.52   DLCO COR %Predicted % 37  39  51  34  37  46   DLVA Predicted % 107  110  128  97  109  118   TLC L      4.18   TLC % Predicted %      59   RV % Predicted %      95     No results found for: "NITRICOXIDE"      Assessment & Plan:   No problem-specific Assessment & Plan notes found for this encounter.     Rexene Edison, NP 12/04/2022

## 2022-12-05 ENCOUNTER — Ambulatory Visit: Payer: Medicare Other

## 2022-12-05 DIAGNOSIS — I25118 Atherosclerotic heart disease of native coronary artery with other forms of angina pectoris: Secondary | ICD-10-CM

## 2022-12-05 LAB — RESPIRATORY ALLERGY PROFILE REGION II ~~LOC~~
Allergen, A. alternata, m6: 0.76 kU/L — ABNORMAL HIGH
Allergen, Cedar tree, t12: <0.1 kU/L
Allergen, Comm Silver Birch, t9: <0.1 kU/L
Allergen, Cottonwood, t14: <0.1 kU/L
Allergen, D pternoyssinus,d7: 0.38 kU/L — ABNORMAL HIGH
Allergen, Mouse Urine Protein, e78: <0.1 kU/L
Allergen, Mulberry, t76: <0.1 kU/L
Allergen, Oak,t7: <0.1 kU/L
Allergen, P. notatum, m1: 0.63 kU/L — ABNORMAL HIGH
Aspergillus fumigatus, m3: 1.4 kU/L — ABNORMAL HIGH
Bermuda Grass: <0.1 kU/L
Box Elder IgE: <0.1 kU/L
CLADOSPORIUM HERBARUM (M2) IGE: 0.84 kU/L — ABNORMAL HIGH
COMMON RAGWEED (SHORT) (W1) IGE: <0.1 kU/L
Cat Dander: <0.1 kU/L
Class: 0
Class: 0
Class: 0
Class: 0
Class: 0
Class: 0
Class: 0
Class: 0
Class: 0
Class: 0
Class: 0
Class: 0
Class: 0
Class: 0
Class: 0
Class: 0
Class: 0
Class: 0
Class: 1
Class: 1
Class: 2
Class: 2
Class: 2
Cockroach: <0.1 kU/L
D. farinae: 0.27 kU/L — ABNORMAL HIGH
Dog Dander: <0.1 kU/L
Elm IgE: <0.1 kU/L
IgE (Immunoglobulin E), Serum: 160 kU/L — ABNORMAL HIGH (ref <=114)
Johnson Grass: <0.1 kU/L
Pecan/Hickory Tree IgE: <0.1 kU/L
Rough Pigweed  IgE: <0.1 kU/L
Sheep Sorrel IgE: <0.1 kU/L
Timothy Grass: <0.1 kU/L

## 2022-12-05 LAB — POC COVID19 BINAXNOW: SARS Coronavirus 2 Ag: NEGATIVE

## 2022-12-05 LAB — INTERPRETATION:

## 2022-12-05 NOTE — Assessment & Plan Note (Signed)
Recent acute sinusitis.  Has completed a 2-week course of antibiotics last month.  Hold on additional antibiotics this time.  Increase his chronic rhinitis regimen.  ENT consult is pending.  Plan  Patient Instructions  Change Claritin to Allegra 127m daily  Add Chlropheniramine 459mAt bedtime  for drainage (can use during daytime but causes sedation)  Delsym 2 tsp Twice daily for cough As needed   Tessalon Three times a day  for cough As needed   Prednisone taper over next week  Sputum cultures  Change Advair to Breztri 2 puffs Twice daily  , rinse after use.  Albuterol inhaler /neb As needed   Continue on Singulair daily  Labs today .  Follow up with ENT as planned Follow up with Dr. RaChase Callern 6 weeks and As needed   Please contact office for sooner follow up if symptoms do not improve or worsen or seek emergency care

## 2022-12-05 NOTE — Assessment & Plan Note (Addendum)
Slow to resolve asthmatic bronchitic exacerbation plus or minus recurrent sinusitis.  Will maximize asthma inhaler regimen.  Patient says he cannot take dry powder.  Will change from Advair to [facility].  Change Claritin over to Zyrtec.  Optimize his cough control regimen and add in Chlortab for chronic postnasal drip Check sputum culture.  Hold on antibiotics at this time.  Treat with a empiric course of steroids.  Patient education on steroid use. Check allergy panel and IgE. Plan  PLAN:  Patient Instructions  Change Claritin to Allegra 177m daily  Add Chlropheniramine 414mAt bedtime  for drainage (can use during daytime but causes sedation)  Delsym 2 tsp Twice daily for cough As needed   Tessalon Three times a day  for cough As needed   Prednisone taper over next week  Sputum cultures  Change Advair to Breztri 2 puffs Twice daily  , rinse after use.  Albuterol inhaler /neb As needed   Continue on Singulair daily  Labs today .  Follow up with ENT as planned Follow up with Dr. RaChase Callern 6 weeks and As needed   Please contact office for sooner follow up if symptoms do not improve or worsen or seek emergency care

## 2022-12-05 NOTE — Assessment & Plan Note (Signed)
Interstitial lung disease/drug-induced pneumonitis.  Most recent CT chest earlier this month showed ILD changes with resolution of groundglass opacities.  Unable to tolerate Esbriet.  Continue to follow.  Plan  Patient Instructions  Change Claritin to Allegra 131m daily  Add Chlropheniramine 451mAt bedtime  for drainage (can use during daytime but causes sedation)  Delsym 2 tsp Twice daily for cough As needed   Tessalon Three times a day  for cough As needed   Prednisone taper over next week  Sputum cultures  Change Advair to Breztri 2 puffs Twice daily  , rinse after use.  Albuterol inhaler /neb As needed   Continue on Singulair daily  Labs today .  Follow up with ENT as planned Follow up with Dr. RaChase Callern 6 weeks and As needed   Please contact office for sooner follow up if symptoms do not improve or worsen or seek emergency care

## 2022-12-07 LAB — ALLERGEN PANEL (27) + IGE
Alternaria Alternata IgE: 0.86 kU/L — AB
Aspergillus Fumigatus IgE: 1.95 kU/L — AB
Bahia Grass IgE: <0.1 kU/L
Bermuda Grass IgE: <0.1 kU/L
Cat Dander IgE: <0.1 kU/L
Cedar, Mountain IgE: <0.1 kU/L
Cladosporium Herbarum IgE: 0.95 kU/L — AB
Cocklebur IgE: <0.1 kU/L
Cockroach, American IgE: <0.1 kU/L
Common Silver Birch IgE: <0.1 kU/L
D Farinae IgE: 0.32 kU/L — AB
D Pteronyssinus IgE: 0.4 kU/L — AB
Dog Dander IgE: <0.1 kU/L
Elm, American IgE: <0.1 kU/L
Hickory, White IgE: <0.1 kU/L
IgE (Immunoglobulin E), Serum: 139 IU/mL (ref 6–495)
Johnson Grass IgE: <0.1 kU/L
Kentucky Bluegrass IgE: <0.1 kU/L
Maple/Box Elder IgE: <0.1 kU/L
Mucor Racemosus IgE: <0.1 kU/L
Oak, White IgE: <0.1 kU/L
Penicillium Chrysogen IgE: 0.93 kU/L — AB
Pigweed, Rough IgE: <0.1 kU/L
Plantain, English IgE: <0.1 kU/L
Ragweed, Short IgE: <0.1 kU/L
Setomelanomma Rostrat: 4.75 kU/L — AB
Timothy Grass IgE: <0.1 kU/L
White Mulberry IgE: <0.1 kU/L

## 2022-12-09 ENCOUNTER — Other Ambulatory Visit: Payer: Medicare Other

## 2022-12-10 ENCOUNTER — Telehealth: Payer: Self-pay

## 2022-12-10 ENCOUNTER — Ambulatory Visit: Payer: Medicare Other

## 2022-12-10 DIAGNOSIS — I25118 Atherosclerotic heart disease of native coronary artery with other forms of angina pectoris: Secondary | ICD-10-CM

## 2022-12-10 NOTE — Telephone Encounter (Signed)
Patient says that you were discontinuing Bisoprolol. I still see it on his medication list. He is not taking it. What was the new bp medication that he was changed to? He would like it called into [facility].

## 2022-12-11 NOTE — Telephone Encounter (Signed)
Spoke with patient concerning Dr. Bonney Roussel recommendations. He verbalized understanding.

## 2022-12-11 NOTE — Telephone Encounter (Signed)
LMOVM

## 2022-12-11 NOTE — Telephone Encounter (Signed)
I don't think I talked about discontinuing bisoprolol. I recommended changing ezetimibe-simvastatin to rosuvastatin 20 mg daily.  Thanks MJP

## 2022-12-22 ENCOUNTER — Other Ambulatory Visit: Payer: Self-pay | Admitting: Hematology and Oncology

## 2022-12-22 DIAGNOSIS — C9000 Multiple myeloma not having achieved remission: Secondary | ICD-10-CM

## 2022-12-23 ENCOUNTER — Inpatient Hospital Stay: Payer: Medicare Other | Attending: Hematology and Oncology

## 2022-12-23 DIAGNOSIS — C9000 Multiple myeloma not having achieved remission: Secondary | ICD-10-CM | POA: Diagnosis present

## 2022-12-23 LAB — CBC WITH DIFFERENTIAL (CANCER CENTER ONLY)
Abs Immature Granulocytes: 0.02 10*3/uL (ref 0.00–0.07)
Basophils Absolute: 0 10*3/uL (ref 0.0–0.1)
Basophils Relative: 1 %
Eosinophils Absolute: 0.5 10*3/uL (ref 0.0–0.5)
Eosinophils Relative: 7 %
HCT: 40.2 % (ref 39.0–52.0)
Hemoglobin: 13.7 g/dL (ref 13.0–17.0)
Immature Granulocytes: 0 %
Lymphocytes Relative: 21 %
Lymphs Abs: 1.3 10*3/uL (ref 0.7–4.0)
MCH: 30.1 pg (ref 26.0–34.0)
MCHC: 34.1 g/dL (ref 30.0–36.0)
MCV: 88.4 fL (ref 80.0–100.0)
Monocytes Absolute: 0.6 10*3/uL (ref 0.1–1.0)
Monocytes Relative: 9 %
Neutro Abs: 3.9 10*3/uL (ref 1.7–7.7)
Neutrophils Relative %: 62 %
Platelet Count: 218 10*3/uL (ref 150–400)
RBC: 4.55 MIL/uL (ref 4.22–5.81)
RDW: 12.7 % (ref 11.5–15.5)
WBC Count: 6.3 10*3/uL (ref 4.0–10.5)
nRBC: 0 % (ref 0.0–0.2)

## 2022-12-23 LAB — CMP (CANCER CENTER ONLY)
ALT: 19 U/L (ref 0–44)
AST: 26 U/L (ref 15–41)
Albumin: 4 g/dL (ref 3.5–5.0)
Alkaline Phosphatase: 54 U/L (ref 38–126)
Anion gap: 6 (ref 5–15)
BUN: 20 mg/dL (ref 8–23)
CO2: 29 mmol/L (ref 22–32)
Calcium: 9.8 mg/dL (ref 8.9–10.3)
Chloride: 99 mmol/L (ref 98–111)
Creatinine: 0.96 mg/dL (ref 0.61–1.24)
GFR, Estimated: >60 mL/min (ref >60)
Glucose, Bld: 76 mg/dL (ref 70–99)
Potassium: 4.2 mmol/L (ref 3.5–5.1)
Sodium: 134 mmol/L — ABNORMAL LOW (ref 135–145)
Total Bilirubin: 0.3 mg/dL (ref 0.3–1.2)
Total Protein: 7.4 g/dL (ref 6.5–8.1)

## 2022-12-23 LAB — LACTATE DEHYDROGENASE: LDH: 240 U/L — ABNORMAL HIGH (ref 98–192)

## 2022-12-24 LAB — KAPPA/LAMBDA LIGHT CHAINS
Kappa free light chain: 36.5 mg/L — ABNORMAL HIGH (ref 3.3–19.4)
Kappa, lambda light chain ratio: 2.28 — ABNORMAL HIGH (ref 0.26–1.65)
Lambda free light chains: 16 mg/L (ref 5.7–26.3)

## 2022-12-25 ENCOUNTER — Telehealth: Payer: Self-pay

## 2022-12-25 NOTE — Telephone Encounter (Signed)
Spoke with pt about his appt 12/26/22 and 01/20/23 for visit with Dr [name] and PFT.

## 2022-12-26 ENCOUNTER — Ambulatory Visit (INDEPENDENT_AMBULATORY_CARE_PROVIDER_SITE_OTHER): Payer: Medicare Other | Admitting: Internal Medicine

## 2022-12-26 ENCOUNTER — Encounter: Payer: Self-pay | Admitting: Internal Medicine

## 2022-12-26 VITALS — BP 132/80 | HR 56 | Temp 97.6°F | Ht 70.0 in | Wt 214.8 lb

## 2022-12-26 DIAGNOSIS — J849 Interstitial pulmonary disease, unspecified: Secondary | ICD-10-CM | POA: Diagnosis not present

## 2022-12-26 DIAGNOSIS — J4530 Mild persistent asthma, uncomplicated: Secondary | ICD-10-CM

## 2022-12-26 NOTE — Patient Instructions (Addendum)
ILD (interstitial lung disease) ([place]) Drug-induced pneumonitis  Currently stable but did not tolerate approved anti-=fibrotics    Plan  - o2 for goal pulse ox > 88% - keep PFT appt in April 2024 and office vsiit - right now supportive care  Cough and congestion/ASthma/SEasonal allergies  - active at the moment  - RAST panel positive for mild IgE elevation and dust mite allergy   Plan  - I support Dr Donneta Romberg wanting to restart allergy shots  Obesity (BMI 30.0-34.9)  Congratulations on ongoing weight loss that is intentional  Plan -continue weight l;oss  - talk to PCP Antony Contras, MD about weight loss drugs  Myeloma    M protein < 0.5 in feb 2024 and  on monitoring plan  Plan  - per [facility] and Dr Burgess Estelle April 2024 after completing PFT; can consider feno at followup

## 2022-12-26 NOTE — Progress Notes (Signed)
IOV 10/04/2011  67 year old male. Allergies and possible asthma. Hypothyroidism. Anxiety state but no formal diagnosis. Has history of claustrophobia as well Non-smoker. Music therapist. Dad with CAD - s/p CABG at age 42 (now 29y)  Referred by Dr Donneta Romberg. Reports dyspnea. INsidious onset "all my life". Increased after starting allergy shots in June 2012. Says during hikes after he gets going it gets better. Same in gym. Notices more when he is starting out or stationary at rest. HE is not sure what it is.  Feels he needs to take a deep breath on occasion. So, now referred to cardiology and pulmonary. Says ICS Qvar in august/sept 2012 made it worse. Outside chart mentions asthma but patient says not sure if he has asthma. But reports childhood hx of asthma diagnosed by a pediatrician at age 84. Says he had similar symptoms.  Up until 1990 used to 10K but even back then had similar symptoms and would need to warm up before feeling better. Then stopped running due to neck problems. Similar during swimming exercise in 1996-1997. Quit swimming 1999. Episodes associated with wheezing but no cough. Unclear if albuterol helps but on xopenex prn esp before walking. This whole thing feels like he is not getting "enough juice". Never had PFT but recollects normal spirometry at Dr Rush [place]mark office.  He has seen Dr Gwendel Hanson cardiology for above - treadmill and echo and carotid doppler all pending. Current pulmonary modulating drug is Singulair only  Chest x-ray 07/16/2011 is clear per my personal review.  xxxxxxxxxxxxxxxxxxxxxxxxxxxxxxxxxxxxxxxxxxxxxxxxxxxxxxxxxxxxxxxxxx  Inpatient consult 06/16/21 66 y/o male with a history of multiple myeloma diagnosed in 2022, currently undergoing treatment presented to the [facility] emergency department from his hematology oncology appointment with a chief complaint of dyspnea.  The patient smoked cigarettes briefly as a young adult but since then has lived a relatively  healthy lifestyle staying active with exercise and playing golf.  He was diagnosed with multiple myeloma in the spring 2022 when a bruise was discovered while he was receiving a sports massage.  He went for further work-up and was found to have abnormal lab work, this led to a bone marrow biopsy confirming diagnosis of multiple myeloma.  He has been followed by Dr. [name] who has been treating him with VRd chemotherapy since Mar 16, 2021.  He is currently in the middle of his fifth cycle.     Over the last several weeks he has been developing progressive shortness of breath with exertion.  He says this has been going on for about 3 to 4 weeks and has significantly worsened in the last week.  This is associated with a dry, rare cough.  He denies fevers, chills, leg swelling, weight gain, or mucus production.  In the last several days (just prior to admission) [place] and while there felt significant shortness of breath so he presented to Dr. Libby Maw office for further evaluation.  He was sent to the emergency room for evaluation further because of the severity of his shortness of breath.  In the emergency department he was noted to have hypoxemia to an O2 saturation of 87% and an abnormal chest exam.  Pulmonary and critical care medicine was consulted for further evaluation.  He says that throughout his adult life he has had what he calls "huffing" when he talks while exerting himself or with exertion.  He says he has been followed by an allergist and has been treated for asthma at  times over the years.  He does not regularly use an inhaler and he says this is not something that he routinely needs.  He was seen by my partner Dr. [name] at 1 point in 2013 during which time he had a normal chest x-ray and normal lung function testing.   He had COVID in April 2022.   June 15, 2021 admission August 26 CT chest images independently reviewed: No pulmonary embolism, diffuse interlobular septal  thickening, pleural nodularity noted, areas of groundglass opacification with some patchy distribution particularly in the lung base, no significant pleural effusion, mild reactive appearing lymphadenopathy in the mediastinum Acute respiratory failure with hypoxemia in the setting of diffuse parenchymal lung disease of undetermined etiology: This is somewhat of a complex case given his underlying asthma and the fact that he had COVID in 2022 and in April 2022 a CT scan of his abdomen showed some ill-defined interstitial changes in the periphery of his lungs on lung windows in the bases.  Based on the time course of his illness and reports in the literature I am most concerned about [name] lung toxicity (see citation below), though it is also important to evaluate for another underlying and progressive primary pulmonary ILD or less likely an opportunistic infection (history doesn't seem to support this).  [name] can cause eosinophilic pneumonia which we can assess for with a bronchoscopy, but his imaging characteristics aren't typical for this.  It is possible that the non-specific interstitial changes seen on his lungs in April 2022 were related to his recent COVID infection.  I don't think his baseline asthma explains his symptoms, though he did have a high serum eosinophil count a few weeks prior to admission (unclear significance).  Saglam B, Paulita Fujita M, Ornek S, Keske S, Tabak L, Cakar N, Zeren H, Aytekin S, [place], Ferhanoglu B. Bortezomib induced pulmonary toxicity: a case report and review of the literature. Am J Blood Res. 2020 Dec 15;10(6):407-415. PMID: 85885027; PMCID: [id number].  06/18/21 -   Results for CLAYTEN, ALLCOCK" (MRN [id number]) as of 07/12/2021 09:13  Ref. Range 06/18/2021 12:57  Monocyte-Macrophage-Serous Fluid Latest Ref Range: 50 - 90 % 5 (L)  Other Cells, Fluid Latest Units: % CORRELATE WITH CYTOLOGY.  Color, Fluid Latest Ref Range: YELLOW  PINK (A)  Total  Nucleated Cell Count, Fluid Latest Ref Range: 0 - 1,000 cu mm 103  Fluid Type-FCT Unknown BRONCHIAL ALVEOLAR LAVAGE  Lymphs, Fluid Latest Units: % 18  Eos, Fluid Latest Units: % 51  Appearance, Fluid Latest Ref Range: CLEAR  HAZY (A)  Neutrophil Count, Fluid Latest Ref Range: 0 - 25 % 26 (H)  FINAL MICROSCOPIC DIAGNOSIS:  - No malignant cells identified  - Benign bronchial cells and pulmonary macrophages   Titrate off solumedrol and start prednisone 50 mg daily. Decrease by 10 mg weekly    OV 07/12/2021  Subjective:  Patient ID: Samuel [name], male , DOB: 1956-08-06 , age 66 y.o. , MRN: [id number] , ADDRESS: [place] [id number] PCP Antony Contras, MD Patient Care Team: Antony Contras, MD as PCP - General (Family Medicine)  This Provider for this visit: Treatment Team:  Attending Provider: Brand Males, MD    07/12/2021 -   Chief Complaint  Patient presents with   Hospitalization Follow-up    Pt states he was doing better after being out of the hospital after being placed on steroids. States about a week after being out, states he started having more problems  with SOB and to today, he has had problems with SOB with exertion.   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 -presents for follow-up from the hospital.  I originally met him 10 years ago and then at that point in time he had dyspnea.  He tells me that after that he was exercising and living a good life.  Then in April 2022 got diagnosed incidentally with myeloma multiple.  Says he was caught early.  Then in May 2022 started on [name] and [name].  He says he was doing well with this up until mid July.  Up until then he was walking 1-1/2 2 miles per day 5 days a week.  Then in late July 2022 started noticing shortness of breath.  He met Dr. [name] his oncologist on May 25, 2021.  By June 01, 2021 he had severe pulmonary  infiltrates.  He says a part of then he was getting his [name] once a week along with some steroids with each dose.  At this point in time the steroid dose was cut down but he progressed and started getting worse.  Then on June 15, 2021 he was in significant respiratory distress and admitted to the hospital.  I reviewed the hospital records.  He was there for 3 days.  He underwent bronchoscopy with BAL there is also 51% eosinophilia.  Cultures are negative malignant cells are negative.  He was seen by my pulmonary colleagues.  Discussion was held with the [name] other [name] was the etiology.  Given the use and failure it looks like his [name] has been held.  He was discharged on 50 mg prednisone with advised to taper it by 10 mg/week.  Currently is on 20 mg prednisone.  He started playing golf again and feeling really good through Labor Day weekend..  Then on June 29, 2021 his [name] was not given.  Up until this point all chemo was held.  He was given Cytoxan, Zomenta and [name].  The very next in June 30, 2021 he started feeling worse.  He feels [name] is the etiology for this.  On July 06, 2021 he communicated this with Dr. [name] and all chemo has been held.  He is being referred to the bone marrow transplant program at [facility] for further evaluation.  At this point in time he says he is much better than when he was in the hospital but definitely not as good as he was prior to getting a rechallenge with [name] on June 29, 2021.  But he notices that he slowly improving.  Walking desaturation test shows that his pulse ox is holding up although he does have a tendency to desaturate.   Did extensive review of the literature.  According to up-to-date [name] is a known etiology for pulm eosinophilia but he got worse after getting rechallenged with [name].  Review of the literature shows that [name] can cause drug-induced pneumonitis and a small fraction of patients.  The  literature is  NO INFOR  as to whether these patients have eosinophilia or not but definitely they seem to be steroid responsive.  This literature is attached below.  In the timeframe also fits in with [name].  He is also noticed to be on allopurinol which rarely can cause hypersensitivity syndrome or dermatitis, hepatitis and eosinophilia - ADDRESS Syndrome but he did not have thse features of rash etc     Nevertheless significant eosinophilia in  the BAL 51% suggest drug-induced pneumonitis.  He currently has significant symptoms of shortness of breath.  He does not have oxygen with him at home.  Simple office walk 185 feet x  3 laps goal with forehead probe 07/12/2021    O2 used ra   Number laps completed 3   Comments about pace slow   Resting Pulse Ox/HR 100% and 69/min   Final Pulse Ox/HR 92% and 81/min   Desaturated </= 88% no   Desaturated <= 3% points Yes 8   Got Tachycardic >/= 90/min no   Symptoms at end of test No complaints   Miscellaneous comments x        Causes of Pulmonary Eosinophilia: from UPTODATE A. Nonsteroidal antiinflammatory drugs (NSAIDs) B. Nitrofurantoin  C. Minocycline D. Sulfonamides/ Sulfamethoxazole E. Ampicillin F. Daptomycin G.Vancomycin H. Dapsone I. Beta-lactam antibiotics J. Nevirapine K. Telaprevir L. Sulfasalazine M. Methotrexate N. Mesalamine O. Amiodarone P. Bleomycin Q. ACE Inhibitor R. Beta blocker S. Hydrochlorothiazides T. L- Tryptophan U. Allopurinol V. Carbamazepine W. Lamotrigine X. Phenytoin Y. Phenindione Z. Fluindione AA. Olanzapine AB. Oxcarbazepine AC. Strontium Ranelate AD. Lenalidomide AE. Radiographic contrast media  Saglam B, Kalyon H, Ozbalak M, Ornek S, Keske S, Tabak L, Cakar N, Zeren H, Aytekin S, [place], [place] B. Bortezomib induced pulmonary toxicity: a case report and review of the literature. Am J Blood Res. 2020 Dec 15;10(6):407-415. PMID: 38182993; PMCID: [id number]. - Bortezomib  related pulmonary toxicities are rarely reported. Although the incidence of Bortezomib induced lung injury (BLI) is unknown, in a large registry study of 1010 MM patients, 45 patients were reported to have BLI by their physician. However, the causality could only be constituted in 26 patients (2.6%), with 5 of them resulting in death despite steroid treatment.In the case reports, the average number of RVD cycles until the toxicity was presented was 6.9, and the period between the development of pulmonary toxicity and the first dose of Bortezomib was 31.1 days -No mention of eosinophilia  Bortezomib therapy-related lung disease in [place] patients with multiple myeloma: Incidence, mortality and clinical characterization Dyann [facility] Miyao,2 Vidalia Masahiko Kusumoto,5 Storden Fumikazu BOFBP,1 [place] Sugiyama,8 Kiyohiko Hatake,9 [place] Pao Fukuda,10 and [place] patients registered, 81 (4.5%) developed BILD, 5 (0.50%) of whom had fatal cases. The median time to BILD onset from the first bortezomib dose was 14.5 days, and most of the patients responded well to corticosteroid therapy. A retrospective review by the Lung Injury Medical Expert Panel revealed that the types with capillary leak syndrome and hypoxia without infiltrative shadows were uniquely and frequently observed in patients with BIL - no mention of eosinophilia  CellularOperator.fi =- allopurinol complex multisystem Hypersensitivityey   has a past medical history of Asthma, High cholesterol, Perennial allergic rhinitis, Sciatic pain, right, Seasonal allergic rhinitis, and Thyroid disease.   reports that he quit smoki     07/20/2021  - Visit, NP [name]    67 year old male former smoker followed in our office for shortness of breath and interstitial lung disease.  Established with Dr. [name].  Initially  consulted when inpatient with our team in August/2022.  Chief complaint at that point time was dyspnea.  He was diagnosed with multiple myeloma in the spring 2022 when a bruise was discovered while he was recovering from a sports massage.  He went for further work-up and he was found to have abnormal lab work this led to a bone  marrow biopsy confirming diagnosis of multiple myeloma.  He has been followed by Dr. [name].  He was in the middle of treating him with VRD chemotherapy since May/20 04/2021.  He was in the middle of his fifth cycle.  Patient also had [place] in April/2022.  Chest CT in April/2022 showed ill-defined interstitial changes in the periphery.  Concern for [name] lung toxicity.  Patient also had a bronchoscopy performed in 06/18/2021.  Eosinophils 51%, lymphs 18%, no malignant cells identified patient was started on a long-term prednisone taper.  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  Still currently having significant symptoms of shortness of breath.    Last seen in our office on 07/12/2021 by Dr. [name].  Plan of care at that office visit was as follows: Stop [name], check chest x-ray, check CBC with differential, blood ESR, blood IgE, vitamin D, hemoglobin A1c, G6PD, walk test, check Ono at home, change prednisone to 50 mg daily for 2 weeks, then 40 mg daily for 2 weeks, then 30 mg daily for 2 weeks, then 20 mg daily for 2 weeks, 2-week follow-up with APP or Dr. [name], 4-week follow-up with Dr. [name] and 30-minute time slot.  Per chart review on 07/13/2021 [name] was discontinued.  Patient was also seen by Dr. [name] on 07/18/2021 I am unable to view this note.  Patient reporting that weight team would like for him to resume [name] at a maintenance dose of 10 mg.  This is the current plan.  He will see oncology next Friday for blood work.  Patient is scheduled to complete an overnight oximetry test next Tuesday.  He remains  adherent to his prednisone taper 50 mg daily.  Patient reports that this weekend he did play 9 holes of golf on Saturday and on Sunday.  He was limited by his physical exertion.  He also exercised on Tuesday and worked out with a Clinical research associate.  Patient is eager to get back to baseline physical activity.  Patient reports that on Wednesday (07/18/2021) of this week he had increased shortness of breath, cough, worsened acid reflux and he vomited.  Patient believes that this may also be due to the fact that he ate a dinner meal quite quickly.  This sometimes happens when he does this.  He also reports that his blood pressure was high at that time.  Patient reports that he has been off the [name] for at least 1 month.  Patient has stopped his allopurinol as of 07/18/2021.  Patient and spouse are both frustrated regarding dyspnea and have hopes that he would be improving quicker.  There are also concerns that he may have acute worsened symptoms or an acute infection such as bronchitis.  We will discuss and evaluate for this today.  Walk today in office: 07/20/2021-completed 2 laps on room air, dropped to 93%  07/26/2021- Interim hx  Patient presents today for 1 week follow-up. ILD felt to be related to drug pneumonitis from [name] as well as [name] for his treatment of multiple myeloma. BAL showed significant eosinophilia. Dr. [name] lowered dose of [name] to 49m daily. CXR on 07/20/21 showed chronic ILD, no definite acute findings. Ambulatory walk during his last visit showed no oxygen desaturations. During his last visit he was ordered for HRCT, PFTs and ONO.   Accompanied by his wife. He had a bad weekend, his respiratory symptoms have been slightly better the last two days. He is currently off BOTH [name] and [name] (may retry  [name] at lower dose). He stopped using BREO d/t throat irritation. Xanax has helped relieves some anxiety and chest tightness. Wife reports that he is sleeping better. He in  on prolonged prednisone taper. He will be starting 67m prednisone tomorrow  x 2 weeks. He is taking Singulair and generic fluticasone nasal spray. He has been off allergy shots since August. HRCT and PFTs are scheduled for next week. Awaiting results to be faxed for ONO from [employer].    OV 08/02/2021 -   Subjective:  Patient ID: VBillee [name] male , DOB: 131-Aug-1957, age 67y.o. , MRN: [id number], [place]ESS: [id number] SAntony Contras MD Patient Care Team: SAntony Contras MD as PCP - General (Family Medicine)  This Provider for this visit: Treatment Team:  Attending Provider: RBrand Males MD  Type of visit: Telephone/Video Circumstance: COVID-19 national emergency Identification of patient VDAMIEON ARMENDARIZwith 1November 02, 1957and MRN [id number]- 2 person identifier Risks: Risks, benefits, limitations of telephone visit explained. 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 CT abdomen/pelvis: Multiple small lytic lesions in the thoracolumbar spine and bilateral pelvis -SPEP: IgG 2,082 (H), M Protein 1.8 (H). IFE shows IgG monoclonal protein with kappa light chain specificity.  -LDH 169, CBC normal, CMP normal except for sodium 131 (L), Chloride 96 (L).   02/14/2021: Establish care with IDede QueryPA-C 02/22/2021: bone marrow biopsy confirms the diagnosis of Multiple Myeloma with a monoclonal plasma cell population.  03/16/2021: Cycle 1 Day 1 of VRd chemotherapy  04/06/2021: Cycle 2 Day 1 of VRd chemotherapy  04/27/2021: Cycle 3 Day 1 of VRd chemotherapy  05/18/2021: Cycle 4 Day 1 of VRd chemotherapy  06/01/2021: drop  dexamethasone to 244mPO weekly and start lasix due to shortness of breath.  06/15/2021: Desaturation to 87% on ambulation. HELD velcade today and sent to ED for evaluation.  06/22/2021: Findings consistent with drug reaction the lungs with eosinophils on BAL.  Given these findings we will definitely hold [name] and plan to avoid pomalidomide 06/29/2021: Cycle 1 Day 1 CyBorD chemotherapy   HPI ViPatty Lopezgarciaosco [age].o. -there is a telephone visit.  He is supposed to see me next week but he had a CT scan of the chest 2 days ago and had pulmonary function test today.  He really wanted to discuss the results today itself.  Review of the records indicate that he still on prednisone taper.  He is able to ambulate and desaturated only after 200 feet.  There are some mild nocturnal desaturation.  We started 2 L of nasal cannula oxygen.  His CT scan of the chest shows diffuse groundglass opacity in a pattern that is inconsistent with UIP [less than 40% chance this is UIP] suggestive of alternate pattern.  He says he is able to do weight training exercises well but when he walks on a treadmill or does ambulation that is when it bothers him.  When he rests he is better.  He does desaturate to 80s percent.  He is wondering if this could be from asthma.  I told him otherwise.  Touch base with Dr. DoLorenso Courieris oncologist.  He is  scheduled for cyclophosphamide tomorrow along with low-dose [name].  This was held off recently in September 2022.  But oncology is wanting to rechallenge him with low-dose [name].  They wanted approval for this.   ONO RA 07/24/21   - - 45 min spent <88%. 7+ hours was > 90%. OVerall not bad  Plan  Start 2L [place] QHS  Results for HARGIS, VANDYNE" (MRN [id number]) as of 08/02/2021 12:16  Ref. Range 08/02/2021  08/02/2021   FVC-Pre Latest Units: L 1.55   FVC-%Pred-Pre Latest Units: % 33   Results for JAVONTA, GRONAU" (MRN [id number]) as of 08/02/2021 12:16  Ref. Range  08/02/2021    DLCO cor Latest Units: ml/min/mmHg 12.52   DLCO cor % pred Latest Units: % 46   Results for JAXAN, MICHEL" (MRN [id number]) as of 08/02/2021 12:16  Ref. Range 08/02/2021    TLC Latest Units: L 4.18   TLC % pred Latest Units: % 59    Results for JIRO, KIESTER" (MRN [id number]) as of 08/02/2021 12:16  Ref. Range 07/12/2021 09:54  G-6PDH Latest Ref Range: 7.0 - 20.5 U/g Hgb 14.8   CT Chest data  CT Chest High Resolution  Result Date: 07/31/2021 CLINICAL DATA:  Evaluate for interstitial lung disease EXAM: CT CHEST WITHOUT CONTRAST TECHNIQUE: Multidetector CT imaging of the chest was performed following the standard protocol without intravenous contrast. High resolution imaging of the lungs, as well as inspiratory and expiratory imaging, was performed. COMPARISON:  Chest CT dated June 15, 2021; abdomen and pelvis CT dated February 02, 2021 FINDINGS: Cardiovascular: Cardiomegaly with trace pericardial effusion. Coronary artery calcifications of the RCA and LAD. Atherosclerotic disease of the thoracic aorta. Mediastinum/Nodes: Esophagus is unremarkable. Atrophic thyroid. Mediastinal lymph nodes are decreased in size when compared with prior exam. Reference AP window lymph node on series 2, image 42 measures 1.1 cm in short axis, previously 1.3 cm. Lungs/Pleura: Central airways are patent. Images are motion degraded. Mild diffuse ground-glass opacity with peribronchovascular and subpleural reticular glass opacities and traction bronchiectasis. No clear craniocaudal predominance. Mild bilateral air trapping. 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 Stable solid right middle lobe pulmonary nodule measuring 3 mm on series 3, image 57. Upper Abdomen: No acute abnormality. Musculoskeletal: No chest wall mass or suspicious bone lesions identified. IMPRESSION: Limited evaluation due to respiratory motion artifact. Within limitations, there are diffuse bilateral  ground-glass opacities with peribronchovascular and subpleural reticular opacities, traction bronchiectasis and no clear craniocaudal predominance. Differential considerations include sequela of acute lung injury, NSIP, or fibrotic HP given presence of air trapping. Mild subpleural reticular opacities were present on visualized portions of the lung on prior abdomen and pelvis CT dated February 02, 2021, although majority of findings are new. Findings are suggestive of an alternative diagnosis (not UIP) per consensus guidelines: Diagnosis of Idiopathic Pulmonary Fibrosis: An Official ATS/ERS/JRS/ALAT Clinical Practice Guideline. [place], Iss 5, [phone], Jun 21 2017. Small solid pulmonary nodule the right middle lobe measuring 3 mm. No follow-up needed if patient is low-risk. Non-contrast chest CT can be considered in 12 months if patient is high-risk. This recommendation follows the consensus statement: Guidelines for Management of Incidental Pulmonary Nodules Detected on CT Images: From the Fleischner Society 2017; Radiology 2017; 284:228-243. Aortic Atherosclerosis (ICD10-I70.0). Electronically Signed   By: Yetta Glassman M.D.   On: 07/31/2021 12:01         08/16/2021 -  fu drug induced  pneumonitis   HPI Gianmarco Roye Ryer 67 y.o. -26md desaturated. STarted on portable o2 since yesterday and Is feeling better. On Room air say he is desaturated.  Called to discuss Bronch . He is scheduled to see DR PAtwardhan 08/22/21 wed at 8.30am. PRefers not to have bronch done at that that tme.  Discussed the consensus about having bronchoscopy with lavage to rule out any opportunistic infections.  At this time I took the opportunity of also recommending transbronchial biopsies.  He did have transbronchial biopsies when he was a lot more hypoxemic in August 2022 we will send for histopathology and was nondiagnostic.  This time I recommended we send it off for RNA genomic classifier for UIP it is  not a sensitive test but it is specific.  If it comes back positive then we would know that there is permanency to this and also its a marker of progression potentially.  He is willing to go through this.  Explained we under general anesthesia. Based on schedule will be myself or Dr. IValeta Harmsdoing it.  Explained the following risks   Risks of pneumothorax, hemothorax, sedation/anesthesia complications such as cardiac or respiratory arrest or hypotension, stroke and bleeding all explained. Benefits of diagnosis but limitations of non-diagnosis also explained. Patient verbalized understanding and wished to proceed.    We then discussed pulse dose steroids.  Told him we will have to wait close to a week to make sure there is fungal smears PCP and AFB smears and bacterial culture negative.  At that point we will have to take a decision on giving 1 g Solu-Medrol for the a day for 3 days.  Ideally would need admission he does not want this.  He wants to do with is on an outpatient setting ideally.  Explained to him about the risks with steroids such as hyperglycemia, hypertension but said that I would try to work with the DME company or outpatient/home nursing to see if this would be possible.  He was appreciative.  Also discussed with Dr. IValeta Harmswho is willing to do the biopsy based on schedule of the operating room, myself and the patient if things do not work out.  Sent a secure chat to Dr. PVirgina Jock  Told him about the patient.  He will give u[place] clearance on 08/22/2021.  Recommended also look for BNP and heart failure.  CT Chest data  No results found.   OV 08/09/2021  Subjective:  Patient ID: VBillee [name] male , DOB: 103-Dec-1957, age 67y.o. , MRN: [id number], [id number] [id number] SAntony Contras MD Patient Care Team: SAntony Contras MD as PCP - General (Family Medicine)  This Provider for this visit: Treatment Team:  Attending Provider: RBrand Males  MD    08/09/2021 -   Chief Complaint  Patient presents with   Follow-up    Pt states he is about the same since last visit, maybe a little better. Pt is coughing more at times and states he does cough a lot before bed.   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 -returns for follow-up.  He is finishing up 40 mg of prednisone per day.  Restarting 30 mg/day of prednisone tomorrow.  He is not really better.  Today he was able to only walk 2 out of the customary 3 laps.  And he showed a tendency to desaturate.  He says  he is extremely anxious.  This is understandable.  He also states that when he lifts weights in the gym he does not have a problem but when he exerts himself he feels worse.  Previously his nocturnal desaturation test showed abnormality and we recommended night oxygen but he declined per the CMA.  But today he and his wife tell me that they would be interested in getting some oxygen if it would help with shortness of breath.  Simple walking desaturation test does not make him qualify and will need a 6-minute walk test to qualify for portable oxygen.  They are also worried about the lack of improvement.  They are wondering about second opinion.  I did unofficially check with some of my colleagues in the [place].  No clear-cut plan has been developed.  We discussed about the possibility of visiting Dr. Virgel Manifold, ILD group in [place].  They seemed enthused about the idea but wanted me to reach out to Dr. Ovid Curd first.  In terms of his myeloma review of the medical records from office visit 08/03/2021 with Dr. [name] and talking to the patient and the labs show the myeloma still in remission.  But Dr. [name] is extremely concerned that the myeloma will come back.  Oncology still wants to rechallenge him with [name] but after my personal discussion with Dr. [name] on  08/02/2021 and concern for pulmonary toxicity chemotherapy is on hold till patient recovers.  He is also concerned about the presence of coronary artery calcification on his recent CT scan of the chest.  His dad had coronary artery disease diagnosed at 17 and had bypass.  He wants to visit this with Dr. Einar Gip again       08/31/2021 -video visit to discuss bronchoscopy results from 08/28/2021 and next step in plan    HPI JERMANIE MINSHALL 67 y.o. -his bronchoscopy with lavage 08/28/2021 shows 40% lymphocytes.  Anything greater than 30% is against UIP.  His RNA genomic classifier is in progress and that would be a specific test although not a ensitive test for UIP.  Bacterial cultures are negative.  He continues have shortness of breath and on room air at rest he is fine but sometimes when he goes to the bathroom he can desaturate into the high 80s.  He is frustrated by his condition.  Our original plan was to ensure no opportunistic or bacterial infections [fully understanding that MTB and fungal infections can take 6 weeks] with the current bronchoscopy and to consider bronchoalveolar lavage.  And then based on this we will schedule pulsed dose steroids.    Recently his G6PD is returned is normal.  He is not on Bactrim for prophylaxis.  Current prednisone Is 68m per day.  He prefers outpatient treatment plan.  We discussed the side effects of high-dose steroids including opportunistic infection, anxiety and irritability, hypertension, diabetes, other lab abnormalities.  Explained the upset benefit of potentially improving upon current ILD active inflammatory phase.  He is willing to go through this treatment.  He prefers outpatient.  He will require lab and vital sign monitoring.   his wife wanted to know if this could be sarcoidosis.  She is read some case reports of sarcoidosis and myeloma associated.  I expressed to them the clinical features do not fit in with sarcoidosis.  However we can check for  autoimmune and sarcoid features  Results for TDAXTIN, LEIKER (MRN [id number] as of 08/31/2021 16:38 ENVISIA - NEGATIVE FOR UIP  Ref. Range 06/18/2021 12:57 08/28/2021 16:51  Monocyte-Macrophage-Serous Fluid Latest Ref Range: 50 - 90 % 5 (L) 10 (L)  Other Cells, Fluid Latest Units: % CORRELATE WITH CYTOLOGY. MESOTHELIAL AND BRONCHIAL LINING CELLS  Color, Fluid Latest Ref Range: YELLOW  PINK (A) COLORLESS (A)  Total Nucleated Cell Count, Fluid Latest Ref Range: 0 - 1,000 cu mm 103 183  Fluid Type-FCT Unknown BRONCHIAL ALVEOLAR LAVAGE BRONCHIAL ALVEOLAR LAVAGE  Lymphs, Fluid Latest Units: % 18 40  Eos, Fluid Latest Units: % 51 0  Appearance, Fluid Latest Ref Range: CLEAR  HAZY (A) CLEAR (A)  Neutrophil Count, Fluid Latest Ref Range: 0 - 25 % 26 (H) 50 (H)    CT Chest data  DG CHEST PORT 1 VIEW  Result Date: 08/28/2021 CLINICAL DATA:  Status post bronchoscopy. EXAM: PORTABLE CHEST 1 VIEW COMPARISON:  Chest radiographs 07/20/2021 and CT 07/30/2021 FINDINGS: The cardiac silhouette is borderline enlarged. Lung volumes are chronically low and slightly lower than on the prior radiographs. The interstitial markings are chronically increased diffusely. No definite acute airspace consolidation, overt pulmonary edema, sizable pleural effusion, or pneumothorax is identified. Prominent gaseous distension of the stomach is partially visualized. IMPRESSION: Low lung volumes with chronic interstitial changes. Electronically Signed   By: Logan Bores M.D.   On: 08/28/2021 19:10   DG C-ARM BRONCHOSCOPY  Result Date: 08/28/2021 C-ARM BRONCHOSCOPY: Fluoroscopy was utilized by the requesting physician.  No radiographic interpretation.       OV 09/25/2021  Subjective:  Patient ID: Samuel [name], male , DOB: 19-Apr-1956 , age 37 y.o. , MRN: [id number] , ADDRESS: [place] [id number] PCP Antony Contras, MD Patient Care Team: Antony Contras, MD as PCP - General (Family  Medicine)  This Provider for this visit: Treatment Team:  Attending Provider: Brand Males, MD    09/25/2021 -   Chief Complaint  Patient presents with   Follow-up    Pt states that he is beginning to feel better after last visit. States he wears his O2 at 2L majority of the time.  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 JNarda Rutherford- 461monce weekly (for multiple myeloma chemotherapy N/V ppx)   04/09/21 - dexamethasone 50m49mabs - 40 tabs for 28 day supply - Dr. JohNarda Rutherford10m11mce weekly  (for multiple myeloma chemotherapy N/V ppx)   05/07/21 - dexamethasone 50mg 51ms - 40 tabs for 28 day supply - Dr. John Narda Rutherfordmg 101m weekly  (for multiple myeloma chemotherapy N/V ppx)   06/06/21 - dexamethasone 50mg ta15m- 40 tabs for 28 day supply - Dr. John DoNarda Rutherfordeased to 20mg on77meekly  06/18/21 - prednisone 10mg tab62m104 tabs for 34 day supply - Dr. Jennifer Dessa Phimg dail38m7 days, 40 mg daily x 7 days, 30 mg daily x 7 days, 20mg daily36m days, 10mg daily 71mdays, 5mg daily x 90mays   07/13/21 - prednisone 10mg tabs - 270mab for 30 day supply - Dr. Caytlin Better ----Chase Callerily x 153mys, 40 mg daily x 14 days, 30mg daily x 1468ms, 20mg daily there35mr  - Mid Nov 2022  - 1gm solumedrol load x 3 days  as outpatient  - 09/25/2021 - 59m pred per day   HPI VGaberial CadaTosco 67y.o. -returns for follow-up.  Since his last visit we did a loading dose of Solu-Medrol 1 g daily x3 days.  This was in mid November 2022.  After that he is gone back to daily prednisone 20 mg/day.  In the midst of the high-dose steroid he did pick up hypertension and we gave him bisoprolol which he says has helped him significantly.   He is run out of the bisoprolol.  I have asked him to contact his primary care physician to manage his hypertension but we will give him a refill.    He is here for follow-up to see his current status.  He tells me that his effort tolerance is better.  He tells me in the gym he is able to do a little bit more work.  This is compared to a few months ago.  He does tell me that gym exercises are easier on him than climbing the stairs.  Stairs - he avoids and gets dyspneic. Not tested his pulse ox on stairs His subjective symptom profile is slightly better compared to October 2022 but his walking desaturation test is around the same.   So suspect amount of his interstitial lung disease might be better but suspect still remains.  Review of his pulmonary function test from 10 years ago was normal.  In April 2022 he had early ILD.  He currently definitely has ILD.  His RNA genomic classifier is negative.  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  This would make him eligible for nintedanib. He continues on prednisone 20 mg/day.  In terms of his myeloma: He had his wife say that it is still under remission but they are worried about relapse.  They are worried about future direction and treatment of myeloma particularly because he has had issues with treatment that then resulted in acute lung injury.   Of note  - he is frustrated by poor customer service of our office workflows - cChristella Scheuermanndenies his RNA genomic classifer biopsy    OV 10/19/21  S: call to give update on conversation with Dr DLorenso Courierhis hematologist  1. Myeloma - latest dec 2022 blood work back - still under remission. Dr DLorenso Courierindicated that highly unlikely he will be a BMT Candidate for myeloma if his lung. [facility] If not a [facility] candidate - then cytoxan regimen short term would be used (indicated that is of ptioential benefit to lungs)  2. 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 Would need SLB to ID etiology preciesly but with myelom and Thereapuetic trial with steroids  and his presentation - this has not been a consideratgion till now.  Explained to Dr DLorenso Courierif ILD progresses futher - patient life expectancy is limited. Discussed with patient again and he is agreeable for prednisone 166mper day and starting ofev (awaiting donor samples and insurance proces in 2023).   3. WE discussed [facility] to see if lung transplant would be an option at all if he were to decline esp in  setting of myeloma. Maybe a BMT as well . Do not know answers but wil email Dr Doy Mince at [facility] to get patient in for visit. He might well need a surgical lung biopsy but this can be addressed in due course  Patient and wife agreeable  I spent time emailing Dr Serita Grit trasnplant doc at [facility] - later heard from Dr Doy Mince- feels that Myleoma will need to be in remssion for >= 5 years before he can be considered lung transplant evaluable. They feel no need to see Mr Dubin in transplant clinicl. I subsequetly d/w Mr Zulauf - will revert back to holding off lung transplant evaluation. He wil proceed with ofev. He will see [facility]MT team. Will cosndier a [facility] opinion after d;w hi     2nd Pulm Opinion at [facility] - Dr Kathi Ludwig    Comment: The patient seems to have an inflammatory process that has resulted in progressive loss of lung function, worsening hypoxia. Unfortunately on the most recent imaging I do see some signs of fibrosis including a few areas of traction bronchiectasis although I do not see honeycombing or profuse traction bronchiectasis. We did discuss  future therapies. I told him that unfortunately since his lung has already suffered injury I think he would be at risk for developing lung injury again and that many chemotherapeutics have been associated lung injury. I also told him these are idiosyncratic reactions and it was not possible to predict who on an individual basis would develop inflammation from chemotherapy to any specific agent. He brought up Cytoxan I told him that while Cytoxan issues for inflammatory lung disease on the other hand it has been associated with pulmonary inflammation. His inflammatory process is steroid responsive which is not unexpected given that eosinophils were found on BAL.  We will check oxygen assessment with exercise to see how much oxygen as needed with more than ordinary exertion.  Plan:    - Check oxygen assessment  Will discuss with his pulmonary provider Dr. [name] and then Dr. Feliciana Rossetti  Thank you for the opportunity to provide consultation for your patient. If I can be of further assistance please do not hesitate to contact my office.   [place] [facility]   ASSESSMENT / PLAN  1. Interstitial lung disease-severe DLCO reduction (33% predicted) 2. 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 [name] induced)-started August 2022 3. Faint/early ILD present in April 2022 (even prior to starting myeloma treatment) 4. Hypothyroidism-on replacement with Armour thyroid 5. Dyslipidemia 6. History of gout 7. Chronic allergic rhinitis and some sinusitis-on nasal steroids 8. Childhood history of asthma 9. 25 year history of allergy shots (between ages 26 and 76)  2. Multiple myeloma-diagnosed April 2022-received about 5 cycles of [name]/[name]/dexamethasone until August 2022    There definitely appeared to be some very early changes of ILD even back in April 2022 prior to him starting any of his myeloma treatments. However, there definitely appears to be an acute interstitial pneumonitis type  picture noted on his CT scan from 06/15/2021. He was taken off all myeloma treatments at that time with concern for drug-induced pneumonitis with both [name] as well as [name] being considered probably agents. He subsequently underwent 1 additional cycle of myeloma treatment with [name] alone (without [name]) and had an acute exacerbation of dyspnea symptoms and ever since then has not received any further myeloma treatment.   I discussed with him that he has been on steroids since about  August 2022 and received a steroid bolus in November 2022 with very little change overall in his lung functions. I have mentioned his serial lung function numbers in the HPI above. I do not see any significant change in these numbers despite him receiving excellent care with Dr. [name] in [place].   His CT chest currently shows mostly fibrotic changes with maybe some active inflammatory component still left (although the extent of active inflammation appears to be very little as compared to the initial scan from August 2022).   I discussed with him that we have very little in terms of treatments to offer him. I do not think the addition of an IL 5 inhibitor or dupilumab would be of any benefit in this situation given that his BAL eosinophil count in November was down to 0% on steroids along with the fact that his current CT scan does not show much for active inflammation. Dr. [name] had discussed Cytoxan with him, but I think that that would be somewhat aggressive given that he is already got an active bone marrow problem with the myeloma and so I would discourage him from getting Cytoxan.  In terms of treatment options, we are really left with only 1 option which is to rechallenge him with high-dose steroids for the next 6-8 weeks and then reassess both his CT scan as well as pulmonary function studies. With this in mind, I have the following plan for him:  1. Increase oral prednisone from his 10 mg a day  that he has been currently using for the past month or so to 60 mg a day. I have outlined to taper for him on the prescription and he will see me back on the 30th of May when he is down to 50 mg a day of prednisone.   2. Continue Bactrim PCP prophylaxis. I have refilled the prescription for him.   3. Continue oxygen supplementation  4. Await the recommendations of our myeloma colleagues in Hematology  5. We cannot refer him for lung transplantation given the active diagnosis of multiple myeloma. This was discussed with him.  Overall, I told him that we likely are dealing with end-stage interstitial lung disease with very little in terms of active inflammation and something that is reversible even with the prednisone challenge I have outlined. He was given a prescription for Ofev by Dr. [name], but could not tolerate that due to diarrhea, nausea as well as vomiting. He has an active prescription for Esbriet, but I told him to hold off on that for the next 8 weeks while he is undergoing the prednisone trial so that we do not have any issues with him throwing up on the prednisone and potentially confusing the assessment of treatment response.  If we do not see any improvement in his lung function on CT scan on May 30th, we will slowly taper and discontinue the prednisone and have him start the Virgil.  With regards to multiple myeloma treatment going forward, I would definitely not challenge him with [name]. I am not so sure about the [name] and if that needs to be given to him in the future, we could potentially consider that under the cover of prednisone immunosuppression. This will need to be careful discussion between his Hematology and Pulmonary team given his severe interstitial lung disease in the lack of any wiggle room if he were to decline/have a flare of pneumonitis.  All questions were answered. The patient was satisfied with the visit. No learning  barriers identified during the  visit.  OV 12/04/2021  Subjective:  Patient ID: Samuel [name], male , DOB: [birth year] , age 53 y.o. , MRN: [id number] , ADDRESS: [place] [phone] PCP Antony Contras, MD Patient Care Team: Antony Contras, MD as PCP - General (Family Medicine)  This Provider for this visit: Treatment Team:  Attending Provider: Brand Males, MD  History of COVID-19 in the April  2022 Undiagnosed early ILD in the April 2022 Drug-induced interstitial lung disease - progressive phenotype - Aug 2022 BAL: pulm eosinophilia 51% in the setting of multiple myeloma chemotherapy - aug 2022  - Nov 2022 - BAL 40% lympocytoss 0% eos  - declined by [facility] for transplant eval dec 2022 - needs 5 year sof myelmoma remission    Prednisone history: 03/05/21 - dexamethasone 17m tabs - 40 tabs for 28 day supply - Dr. JNarda Rutherford- 455monce weekly (for multiple myeloma chemotherapy N/V ppx)   04/09/21 - dexamethasone 53m53mabs - 40 tabs for 28 day supply - Dr. JohNarda Rutherford39m32mce weekly  (for multiple myeloma chemotherapy N/V ppx)   05/07/21 - dexamethasone 53mg 21ms - 40 tabs for 28 day supply - Dr. John Narda Rutherfordmg 39m weekly  (for multiple myeloma chemotherapy N/V ppx)   06/06/21 - dexamethasone 53mg ta46m- 40 tabs for 28 day supply - Dr. John DoNarda Rutherfordeased to 20mg on26meekly  06/18/21 - prednisone 10mg tab253m104 tabs for 34 day supply - Dr. Jennifer Dessa Phimg dail58m7 days, 40 mg daily x 7 days, 30 mg daily x 7 days, 20mg daily2m days, 10mg daily 32mdays, 5mg daily x 76mays   07/13/21 - prednisone 10mg tabs - 262mab for 30 day supply - Dr. Jovannie Ulibarri ----Chase Callerily x 139mys, 40 mg daily x 14 days, 30mg daily x 1439ms, 20mg daily there72mr  - Mid Nov 2022  - 1gm solumedrol load x 3 days as outpatient  - 09/25/2021 - 20mg pred per day56mLate December 2022/early January 2023: Start nintedanib - 12/04/21 -prednisone 15mg per day      81m/2023 -   Chief Complaint   Patient presents with   Follow-up    Pt states he stopped taking the OFEV a week ago due to having problems with diarrhea, nausea, and some bleeding still. States since he stopped taking it, he has not had any diarrhea the past 2 days and states the bleeding stopped 2 days ago.     HPI Anthonyjames J Boss 65 N[address] Breighnerurns fo88follow-up with his wife.  He tells me that in terms of his multiple myeloma [facility] and since then is followed with Dr. Dorsey.  He also saLorenso CourierRodolfo Pascal [facility]. Pascal's notes fromLetta Koc[place] this month 2023.  The general feeling that I get is that they are very nervous to do any form of chemotherapy in him for fear of exacerbating his lung disease.  He is extremely worried about this approach.  There is also trepidation about Cytoxan.  He is really worried about what to do if his myeloma came back.  On the other hand he is also worried about his lungs.  He said he did talk to her known physician call Nathan Greenspan [place]ina [place] and [place] use 10 L of oxygen with  exercise.  He wants higher dose concentrator for this.  I was willing to prescribe this.  He is willing to even pay for this out-of-pocket.  He is also recommended sudden breathing exercises.  Patient is trying different breathing exercises with the hope his lungs can heal.  He is aware that he might be dealing with progressive issues.  He was on nintedanib for a month and early February 2023 he called saying he was having diarrhea and also bloody stools.  He has now stopped nintedanib and for the last week he has not had a diarrhea.  For the last few days there is no bloody stools.  He does not want to do this drug again.  The side effects were quite bad.  There is no further bleeding.  He also believes his hypertension resolved or improved after stopping nintedanib.  He did have a colonoscopy 5  years ago and since then has not had any problems.  His next colonoscopy might be in another 5 years.  At this point in time he is able to do treadmill exercise on 5 L and walk 30 minutes 1.7 mph.  Nevertheless when we walked him on room air here in office he desaturated quickly.  It seems like his distance to desaturation is gotten worse.  He is worried about both the myeloma and the interstitial lung disease.  We discussed options about getting further opinions.  He says he wants to go to a place with his extreme expertise about this.  We discussed the idea about having to go out of state including [place], [place] [facility] and the [facility].  I do remember a name of Dr. Stefanie Libel who is professor at [facility] who is an Energy manager in myeloma.  I did mention this name to him.  I have also written to him.  I also written to 1 Dr. Judie Grieve at the pulmonary department at the [facility].  Based on the response we will facilitate a referral.  Meanwhile I did tell him that we need to protect his lungs against fibrosis.  He wants to go down on his prednisone to 10 mg/day because of the side effects of weight gain.  I agreed to do this provisionally.  But I also recommended antifibrotic pirfenidone.  We discussed the side effects of nausea anorexia and occasionally diarrhea.  He wants to reflect on this.  He is willing to meet with the pharmacist on this.  I made a referral.   No results found.    PFT  OV 01/24/2022  Subjective:  Patient ID: Samuel [name], male , DOB: Jan 08, 1956 , age 42 y.o. , MRN: [id number] , ADDRESS: [place] [id number] PCP Antony Contras, MD Patient Care Team: Antony Contras, MD as PCP - General (Family Medicine)  This Provider for this visit: Treatment Team:  Attending Provider: Brand Males, MD    01/24/2022 -   Chief Complaint  Patient presents with   Follow-up    PFT performed 12/10/21. Pt recently had a referral with [facility].  Pt  states he has been doing okay since last visit   Drug-induced pneumonitis follow-up interstitial lung disease  HPI Clara Smolen Embleton 67 y.o. -reviewed [facility] notes.  Dr. Judie Grieve also called me last week.  Decided to go with high-dose prednisone because of some groundglass opacities.  Did not want to do concomitant pirfenidone because of potential side effect profile.  Patient has upcoming follow-up appointment Mar 09, 2022 at  [facility] with Dr. Judie Grieve.  Also saw the myeloma specialist.  In case of recurrence some alternatives have been recommended.  He tells me that he is getting more optimistic.  He understands the significance of his disease in the severely but he is doing breathing exercises remaining optimistic.  He is trying an organic diet.  And therefore his positive state of mind is making him feel better.  He does workout on a treadmill at 2.2 mph.  He covers 1.4 miles in 40 minutes.  He uses 8 L of oxygen for this.   Subjective symptom assessment score is documented below in the slightly better from last visit and significantly better compared to October 2022.  Walking desaturation test is improved from 2 months ago but is similar to follow-up 2022  PFT   OV 04/09/2022  Subjective:  Patient ID: Samuel [name], male , DOB: 1956/04/22 , age 55 y.o. , MRN: [id number] , ADDRESS: [place] [id number] PCP Antony Contras, MD Patient Care Team: Antony Contras, MD as PCP - General (Family Medicine)  This Provider for this visit: Treatment Team:  Attending Provider: Brand Males, MD    04/09/2022 -   Chief Complaint  Patient presents with   Follow-up    Pt states he has been doing okay since last visit but states he did get covid 6/6 and then after that he has had a cough.     HPI Frederik Standley Mccune 67 y.o. 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  He had a second visit to [facility].  He saw  Dr. Ladon Applebaum for pulmonary.  In terms of his pulmonary status things were deemed to be stable.  He had lung function studies.  His FVC is actually improved to 1.68 L on 03/19/2022 and his DLCO is stable around 10.3./Slightly improved.  Overall his pulmonary function test is stable/trending in the right direction.  He is lost significant amount of weight it is if his improvement could be because of that.  However he was feeling subjectively improved.  According to the [facility] notes pirfenidone is not being recommended because he is stable but he tells me that the decision was left up to him.  Certainly they want him to preserve his lung function and lose more weight.  Today wanted to talk about pirfenidone but in the interim immediately after coming back March 27, 2022 he emailed me saying that he had [place].  He believes he got it at [facility].  He has taken antiviral.  After this he is better but he still having some slightly worse dyspnea on exertion than baseline.  Some slightly more subjective use of oxygen at baseline [baseline exertional pulse ox stable/slightly worse].  More coughing and wheezing than baseline.  In terms of his prednisone therapy for his ILD and this is being tapered currently 7.5 mg/day.  [facility] Dr. Judie Grieve has on a tapering regimen to off but in the middle of this he had increased respiratory symptoms.  Wife is also reporting more sciatica as the prednisone is coming down.  In terms of his hematology he has been seen by [facility] multiple myeloma program and they made specific recommendations that I reviewed.  They are going to be in touch with his local hematologist oncologist Dr. [name] but currently under remission.  Weight loss has been emphasized.  We discussed Ozempic for weight loss.     CT Chest data CT  Chest data  No results found.    PFT    OV 06/06/2022  Subjective:  Patient ID: Samuel [name], male , DOB: 08/23/1956 , age 89 y.o. , MRN: [id number] ,  ADDRESS: [place] [id number] PCP Antony Contras, MD Patient Care Team: Antony Contras, MD as PCP - General (Family Medicine)  This Provider for this visit: Treatment Team:  Attending Provider: Brand Males, MD    06/06/2022 -  incent J Ardizzone 67 y.o. 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  Pt states that he has not been feeling well since getting Covid May 2023 after going to [facility].     HPI Reinhardt Hillis Runyan 67 y.o. -Returns for follow-up.  He presents with his wife Shirlean Mylar. 2 weeks ago tapered off prednisone. Same time also at request we started Pirfenidone he has now completed at 2 pills 3 times daily.  He is going to start 3 pills 3 times daily tomorrow.  He says that overall he is not feeling all that well.  His symptom scores of worsened.  He definitely feels more dyspneic.  He is also having worsening cough although his oxygen requirements at home are not any worse.  He is also feeling more tired.  He is having some back pain.  Had MRI and had intrathecal steroid and after that the back pain is better.  He is wondering if all the symptoms are related to coming off prednisone.  At the same time he also states his myeloma is recurring.  His urine M protein is spiking.  He is going to see Dr. [name] in September 2023 and get started on the new regimen recommended by [facility] that is potentially less toxic to the lungs     His pulmonary function test is better than December 2022 seems worse than May 2023 when he was at [facility]  Related to prednisone taper.  His walking desaturation test is stable.          12/04/2022 Follow up : ILD , drug-induced pneumonitis, chronic respiratory failure, asthma Patient returns for a 1 month follow-up.  Patient complains that he continues to have ongoing cough, congestion, nasal drainage,  shortness of breath.  As above patient has underlying interstitial lung disease with drug-induced pneumonitis, felt secondary to medications used for treatment of his multiple myeloma.  Medications were stopped.  Patient was treated with a steroid challenge.  Patient did try Esbriet for brief time in fall 2023 but was unable to tolerate.  Patient also had COVID 19 infection in 2022 and 2023 and felt that his cough worsened after each episode. Patient's wife is also sick at home with an upper respiratory infection.  Has noticed that his cough is been worse over the last week.  Cough is very aggravating.  And affects his quality life.  Worse at night.  Patient does have a dog at home.  But says he is hypoallergenic.  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  Does not have a hot tub or basement.  No birds or chickens.  COVID-19 test and influenza test today in the office are negative.  Patient denies any hemoptysis, chest pain, orthopnea.  Does have some discolored nasal discharge as dark in the morning but clears as the day goes on.  He had similar symptoms last month and was treated with a 14-day  course of antibiotics.  He was also treated in December with antibiotics and steroids.  Patient has been referred to ENT and consult is pending.  He is currently taking Advair twice daily.  Remains on Claritin daily.   OV 12/26/2022  Subjective:  Patient ID: Samuel [name], male , DOB: 10-30-1955 , age 67 y.o. , MRN: [id number] , ADDRESS: [place] [id number] PCP Antony Contras, MD Patient Care Team: Antony Contras, MD as PCP - General (Family Medicine)  This Provider for this visit: Treatment Team:  Attending Provider: Brand Males, MD    12/26/2022 -   Chief Complaint  Patient presents with   Follow-up    Fatigue, cough and chest congestion today.  Has had some improvement.  Esbriet caused dizziness, GI upset and lethargy.      Follow up : ILD ,  drug-induced pneumonitis, chronic respiratory failure, asthma Esbriet stopped in October 2023 due to inability to tolerate due to GI side effects    # IgG Kappa Multiple Myeloma 02/22/2021: bone marrow biopsy confirms the diagnosis of Multiple Myeloma with a monoclonal plasma cell population.   # History of asthma.  Mid February 2020 for mildly elevated IgE and dust mite allergy.  On RAST allergy panel.  HPI Osaze Kleinfeldt Juran 67 y.o. -returns for follow-up.  Presents with his wife Shirlean Mylar.  He has been having some cough and congestion.  Nurse practitioner Patricia Nettle in mid February 2024 did RAST allergy panel.  IgE is slightly high.  He has dust mite allergy.  He states he is known to have allergies.  He did have allergy shots for 10 years leading up until the recent illness and then that was stopped.  His wife feels that the allergy shots did help him.  He has met with Dr. Donneta Romberg and they are discussing whether to restart the allergy shots.  He wanted to know if there is any contraindication.  I felt that he should definitely try allergy shots or at least Xolair.  Although I was not sure whether he should undergo skin testing again.  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  He is able to play golf although very slowly.  Walking desaturation test today was stable very slowly.  He has not had a pulmonary function test and this is scheduled in a few weeks.  I offered to postpone this given his overall subjective symptoms stability and walking desaturation test ability but he wanted to keep it and come back and see me again within the next few to several weeks.  In terms of his myeloma reviewed Dr. Narda Rutherford notes and his M protein is less than 0.5.  Therefore he is still on monitoring plan with plans to start [facility] protocol if myeloma were to relapse.  He is nervous about it because of the recent ILD.  We have agreed to discuss this  again if the situation were to arise.    SYMPTOM SCALE - ILD 08/09/2021 09/25/2021 218# 12/04/2021 222# 01/24/22 222# 04/09/2022 215# 06/06/2022 211# 12/26/2022 214#  Current weight    Treadmill 2.2 mph.  As 1.4 miles over 40 minutes.  Uses 8 L oxygen Post may 2nd visit 03/27/22 -2ndcvoid    O2 use ra ra ra ra 2L eert 2L exert   Shortness of Breath 0 -> 5 scale with 5 being worst (score 6 If unable to do)  At rest 0 0 0 0 0 4   Simple tasks - showers, clothes change, eating, shaving '2 2 1 '$ 0/5 0 3   Household (dishes, doing bed, laundry) x na '1 1 1 3   '$ Shopping 3 1 1.5 0/'5 1 3   '$ Walking level at own pace '4 2 2 1 1 2   '$ Walking up Stairs '5 4 3 '$ 3.'5 3 2   '$ Total (30-36) Dyspnea Score 14 9 8.'5 6 6 17   '$ How bad is your cough? 3 1 0.5  3.4 5   How bad is your fatigue 0 2 0  3    How bad is nausea 0 0 0  0 5   How bad is vomiting?  0 0 0  0 0   How bad is diarrhea? 0 0 0  0 4   How bad is anxiety? '5 2 1  '$ 0.5 0   How bad is depression '2 1 1  '$ 0.5 0   Any chronic pain - if so where and how bad x x x  x       Simple office walk 185 feet x  3 laps goal with forehead probe 07/12/2021  08/09/2021  09/25/2021  12/04/2021  01/24/2022  04/09/2022  06/06/2022  12/26/2022   O2 used ra Ra3 ra ra ra ra ra ra  Number laps completed 3 3 but did oly '2 3 3 '$ attempted but stopped at 2 due to deats All 3 las Stopped at [employer] all 3 Stopped at 2  Comments about pace slow slow slow slow Avg pace Avg  slow slow  Resting Pulse Ox/HR 100% and 69/min 100%ad 74 98% and 75 99% RA and59 98% and HR 60 98% and HR 54 98% nand HR 90 98% n HR 73  Final Pulse Ox/HR 92% and 81/min 93% and 87 91% and 91 86% RA and 85 91% and HR 80 (brief 89%) 88% and HR 80 91% and HR 107 90% an HR 79  Desaturated </= 88% no no no yes ni  no   Desaturated <= 3% points Yes 8 Yes, 7 pponts Yes, 7 points Yes, 13 poins Yes 7 points Yes, 10 points Yes 7 poin   Got Tachycardic >/= 90/min no no yes no no  uyes   Symptoms at end of test No  complaints Mod-severe dyspnea Mild dyspnea No complaints none Seemed ok Mild dyspnea   Miscellaneous comments x Worse? stable ? worse          PFT     Latest Ref Rng & Units 09/26/2022   10:34 AM 08/01/2022    1:57 PM 06/06/2022    1:52 PM 12/10/2021    9:24 AM 10/16/2021    3:56 PM 06/15/2021   10:13 PM  PFT Results  FVC-Pre L 1.32  1.55  1.43  1.49  1.35  1.55   FVC-Predicted Pre % 28  33  30  32  29  33   FVC-Post L      1.45   FVC-Predicted Post %      31   Pre FEV1/FVC % % 85  94  88  94  92  83   Post FEV1/FCV % %      83   FEV1-Pre L 1.12  1.46  1.26  1.40  1.24  1.28   FEV1-Predicted Pre % 32  42  36  40  36  37   FEV1-Post L  1.20   DLCO uncorrected ml/min/mmHg 9.61  10.43  13.19  8.64  9.99  12.00   DLCO UNC% % 36  39  49  32  37  44   DLCO corrected ml/min/mmHg 9.96  10.58  13.77  9.20  10.04  12.52   DLCO COR %Predicted % 37  39  51  34  37  46   DLVA Predicted % 107  110  128  97  109  118   TLC L      4.18   TLC % Predicted %      59   RV % Predicted %      95    HRCT 11/25/22  arrative & Impression  CLINICAL DATA:  Interstitial lung disease   EXAM: CT CHEST WITHOUT CONTRAST   TECHNIQUE: Multidetector CT imaging of the chest was performed following the standard protocol without intravenous contrast. High resolution imaging of the lungs, as well as inspiratory and expiratory imaging, was performed.   RADIATION DOSE REDUCTION: This exam was performed according to the departmental dose-optimization program which includes automated exposure control, adjustment of the mA and/or kV according to patient size and/or use of iterative reconstruction technique.   COMPARISON:  CT angio dated January 06, 2022; high-resolution chest CT dated July 30, 2021; CT angio dated June 15, 2021   FINDINGS: Cardiovascular: Normal heart size. No pericardial effusion. Normal caliber thoracic aorta with mild calcified plaque. Moderate severe coronary artery  calcifications.   Mediastinum/Nodes: Esophagus thyroid are unremarkable. Unchanged mildly enlarged mediastinal and hilar lymph nodes. Reference left hilar lymph node measuring 10 mm on series 4, image 47, unchanged when compared with the prior exam.   Lungs/Pleura: Central airways are patent. Mild bilateral air trapping. Subpleural and peribronchovascular reticular opacities with traction bronchiectasis. No clear craniocaudal predominance. Possible honeycomb change versus paraseptal emphysema of the left upper lobe. No definite evidence of progressive fibrotic changes when compared with July 30, 2021 prior.   Upper Abdomen: No acute abnormality.   Musculoskeletal: No chest wall mass or suspicious bone lesions identified.   IMPRESSION: 1. Diffuse reticular opacities with associated traction bronchiectasis. No definite evidence of progression. Fibrotic fibrotic hypersensitivity is a possibility, although there is only mild air trapping. Diffuse ground-glass opacities described on July 30, 2021 and June 15, 2021 priors exam no longer apparent and favored to be due to acute etiology. Findings are indeterminate for UIP per consensus guidelines: Diagnosis of Idiopathic Pulmonary Fibrosis: An Official ATS/ERS/JRS/ALAT Clinical Practice Guideline. [place], Iss 5, [phone], Jun 21 2017. 2. Moderate to severe coronary artery calcifications. 3. Unchanged mildly enlarged mediastinal and hilar lymph nodes, likely reactive. 4. Aortic Atherosclerosis (ICD10-I70.0).     Electronically Signed   By: Yetta Glassman M.D.   On: 11/25/2022 20:22      has a past medical history of Asthma, GERD (gastroesophageal reflux disease), High cholesterol, History of blood transfusion, Hypothyroidism, Interstitial lung disease ([place]), Multiple myeloma ([place]), Perennial allergic rhinitis, Pneumonia, Sciatic pain, right, Seasonal allergic rhinitis, and Thyroid disease.    reports that he quit smoking about 40 years ago. His smoking use included cigarettes. He has a 0.30 pack-year smoking history. He has never used smokeless tobacco.  Past Surgical History:  Procedure Laterality Date   BRONCHIAL BIOPSY  06/18/2021   Procedure: BRONCHIAL BIOPSIES;  Surgeon: Collene Gobble, MD;  Location: [facility];  Service: Cardiopulmonary;;   BRONCHIAL BIOPSY  08/28/2021   Procedure: BRONCHIAL BIOPSIES;  Surgeon: Garner Nash, DO;  Location: [facility];  Service: Cardiopulmonary;;   BRONCHIAL WASHINGS  06/18/2021   Procedure: BRONCHIAL WASHINGS;  Surgeon: Collene Gobble, MD;  Location: [facility];  Service: Cardiopulmonary;;   BRONCHIAL WASHINGS  08/28/2021   Procedure: BRONCHIAL WASHINGS;  Surgeon: Garner Nash, DO;  Location: [facility];  Service: Cardiopulmonary;;   NASAL SINUS SURGERY     NECK SURGERY  1996   VIDEO BRONCHOSCOPY N/A 06/18/2021   Procedure: VIDEO BRONCHOSCOPY WITH FLUORO;  Surgeon: Collene Gobble, MD;  Location: [facility];  Service: Cardiopulmonary;  Laterality: N/A;   VIDEO BRONCHOSCOPY N/A 08/28/2021   Procedure: VIDEO BRONCHOSCOPY WITH FLUORO;  Surgeon: Garner Nash, DO;  Location: [facility];  Service: Cardiopulmonary;  Laterality: N/A;    Allergies  Allergen Reactions   Clarithromycin Other (See Comments)    Hiccups   Ofev [Nintedanib] Other (See Comments)    GI bleeding   Penicillins Other (See Comments)    Child hood unsure    Immunization History  Administered Date(s) Administered   Fluad Quad(high Dose 65+) 08/09/2021, 08/01/2022   Influenza Split 10/27/2017   Influenza, High Dose Seasonal PF 12/01/2017, 11/29/2019, 12/19/2020, 12/24/2021   Influenza, Quadrivalent, Recombinant, Inj, Pf 07/28/2019, 08/11/2020   Influenza-Unspecified 11/26/2011, 07/21/2017   PFIZER(Purple Top)SARS-COV-2 Vaccination 12/27/2019, 01/24/2020, 09/21/2020   Tdap 08/20/2005, 09/07/2015   Zoster, Live 11/06/2017, 05/07/2018    Family  History  Problem Relation Age of Onset   Hypertension Mother    Allergies Mother    Allergies Father    CAD Father 93   Breast cancer Paternal Grandmother    Hypertension Other    Heart disease Other      Current Outpatient Medications:    acyclovir (ZOVIRAX) 400 MG tablet, Take 1 tablet (400 mg total) by mouth 2 (two) times daily., Disp: 60 tablet, Rfl: 3   albuterol (PROVENTIL) (2.5 MG/3ML) 0.083% nebulizer solution, Take 3 mLs (2.5 mg total) by nebulization every 6 (six) hours as needed for wheezing or shortness of breath., Disp: 120 mL, Rfl: 12   albuterol (VENTOLIN HFA) 108 (90 Base) MCG/ACT inhaler, Inhale 2 puffs into the lungs every 6 hours as needed for wheezing or shortness of breath., Disp: 8.5 g, Rfl: 1   ALPRAZolam (XANAX) 0.5 MG tablet, Take 0.5 mg by mouth at bedtime., Disp: , Rfl:    ASSESS FULL RANGE PEAK METER DEVI, as directed., Disp: , Rfl:    azelastine (ASTELIN) 0.1 % nasal spray, Place 2 sprays into both nostrils 2 (two) times daily. Use in each nostril as directed, Disp: 30 mL, Rfl: 5   benzonatate (TESSALON) 200 MG capsule, Take 1 capsule (200 mg total) by mouth 3 (three) times daily as needed., Disp: 45 capsule, Rfl: 1   bisoprolol (ZEBETA) 5 MG tablet, Take 1 tablet (5 mg total) by mouth daily., Disp: 30 tablet, Rfl: 2   Budeson-Glycopyrrol-Formoterol (BREZTRI AEROSPHERE) 160-9-4.8 MCG/ACT AERO, Inhale 2 puffs into the lungs in the morning and at bedtime., Disp: 10.7 g, Rfl: 5   Budeson-Glycopyrrol-Formoterol (BREZTRI AEROSPHERE) 160-9-4.8 MCG/ACT AERO, Inhale 2 puffs into the lungs in the morning and at bedtime., Disp: 5.9 g, Rfl: 0   cholecalciferol (VITAMIN D) 1000 UNITS tablet, Take 3,000 Units by mouth daily., Disp: , Rfl:    Dextromethorphan HBr (DELSYM PO), Take by mouth as needed. For cough, Disp: , Rfl:    dextromethorphan-guaiFENesin (MUCINEX DM) 30-600 MG 12hr tablet, Take 1 tablet by mouth 2 (two) times daily., Disp: , Rfl:  EPINEPHrine (EPI-PEN) 0.3  mg/0.3 mL DEVI, Inject 0.3 mg into the muscle as needed., Disp: , Rfl:    fluticasone (FLONASE) 50 MCG/ACT nasal spray, 1 spray., Disp: , Rfl:    fluticasone-salmeterol (ADVAIR HFA) 230-21 MCG/ACT inhaler, Inhale 2 puffs into the lungs 2 (two) times daily. Brand name only, Disp: 1 each, Rfl: 12   furosemide (LASIX) 20 MG tablet, TAKE ONE TABLET BY MOUTH AS NEEDED FOR FLUID OR EDEMA, Disp: 14 tablet, Rfl: 1   ketoconazole (NIZORAL) 2 % cream, SMARTSIG:1 Application Topical 1 to 2 Times Daily, Disp: , Rfl:    loratadine (CLARITIN) 10 MG tablet, Take 10 mg by mouth daily., Disp: , Rfl:    montelukast (SINGULAIR) 10 MG tablet, Take 10 mg by mouth at bedtime., Disp: , Rfl:    Multiple Vitamin (MULTIVITAMIN) tablet, Take 1 tablet by mouth daily., Disp: , Rfl:    omeprazole (PRILOSEC) 40 MG capsule, Take 1 capsule by mouth daily., Disp: , Rfl:    OXYGEN, Inhale 2 L into the lungs continuous. As needed, Disp: , Rfl:    PE-diphenhydrAMINE-DM-GG-APAP (DELSYM DAY NIGHT PO), Take by mouth in the morning and at bedtime., Disp: , Rfl:    rosuvastatin (CRESTOR) 20 MG tablet, Take 1 tablet (20 mg total) by mouth daily., Disp: 30 tablet, Rfl: 3   sildenafil (REVATIO) 20 MG tablet, Take 20 mg by mouth daily as needed (ED)., Disp: , Rfl:    Simethicone (SIMETHICONE ULTRA STRENGTH) 180 MG CAPS, Take 1 capsule (180 mg total) by mouth 3 (three) times daily as needed., Disp: 90 capsule, Rfl: 0   sodium chloride (OCEAN) 0.65 % SOLN nasal spray, Place 1 spray into both nostrils as needed for congestion., Disp: , Rfl:    tamsulosin (FLOMAX) 0.4 MG CAPS capsule, Take 0.4 mg by mouth daily., Disp: , Rfl:    thyroid (ARMOUR) 120 MG tablet, Take 120 mg by mouth daily before breakfast., Disp: , Rfl:    triamcinolone cream (KENALOG) 0.1 %, Apply 1 application topically daily as needed (sun burn itch)., Disp: , Rfl:    predniSONE (DELTASONE) 10 MG tablet, 4 tabs for 2 days, then 3 tabs for 2 days, 2 tabs for 2 days, then 1 tab for  2 days, then stop (Patient not taking: Reported on 12/26/2022), Disp: 20 tablet, Rfl: 0      Objective:   Vitals:   12/26/22 1448  BP: 132/80  Pulse: (!) 56  Temp: 97.6 F (36.4 C)  TempSrc: Oral  SpO2: 96%  Weight: 214 lb 12.8 oz (97.4 kg)  Height: '5\' 10"'$  (1.778 m)    Estimated body mass index is 30.82 kg/m as calculated from the following:   Height as of this encounter: '5\' 10"'$  (1.778 m).   Weight as of this encounter: 214 lb 12.8 oz (97.4 kg).  '@WEIGHTCHANGE'$ @  [employer]   12/26/22 1448  Weight: 214 lb 12.8 oz (97.4 kg)     Physical Exam   General: No distress. Occ cough and deep breathing.  Neuro: Alert and Oriented x 3. GCS 15. Speech normal Psych: Pleasant but flast affect + Resp:  Barrel Chest - no.  Wheeze - no, Crackles - yes, No overt respiratory distress CVS: Normal heart sounds. Murmurs - no Ext: Stigmata of Connective Tissue Disease - no HEENT: Normal upper airway. PEERL +. No post nasal drip        Assessment:       ICD-10-CM   1. Mild persistent asthma without complication  J45.30     2. ILD (interstitial lung disease) ([place])  J84.9          Plan:     Patient Instructions  ILD (interstitial lung disease) ([place]) Drug-induced pneumonitis  Currently stable but did not tolerate approved anti-=fibrotics    Plan  - o2 for goal pulse ox > 88% - keep PFT appt in April 2024 and office vsiit - right now supportive care  Cough and congestion/ASthma/SEasonal allergies  - active at the moment  - RAST panel positive for mild IgE elevation and dust mite allergy   Plan  - I support Dr Donneta Romberg wanting to restart allergy shots  Obesity (BMI 30.0-34.9)  Congratulations on ongoing weight loss that is intentional  Plan -continue weight l;oss  - talk to PCP Antony Contras, MD about weight loss drugs  Myeloma    M protein < 0.5 in feb 2024 and  on monitoring plan  Plan  - per [facility] and Dr Burgess Estelle April 2024 after completing PFT;  can consider feno at followup    SIGNATURE    Dr. Brand Males, M.D., F.C.C.P,  Pulmonary and Critical Care Medicine Staff Physician, [place] Director - Interstitial Lung Disease  Program  P[facility] [place] [facility] [place], [place], [address]  Pager: [phone], If no answer or between  15:00h - 7:00h: call [phone] Telephone: [phone]  3:29 PM 12/26/2022

## 2022-12-30 LAB — MULTIPLE MYELOMA PANEL, SERUM
Albumin SerPl Elph-Mcnc: 3.5 g/dL (ref 2.9–4.4)
Albumin/Glob SerPl: 1.1 (ref 0.7–1.7)
Alpha 1: 0.2 g/dL (ref 0.0–0.4)
Alpha2 Glob SerPl Elph-Mcnc: 0.7 g/dL (ref 0.4–1.0)
B-Globulin SerPl Elph-Mcnc: 0.9 g/dL (ref 0.7–1.3)
Gamma Glob SerPl Elph-Mcnc: 1.3 g/dL (ref 0.4–1.8)
Globulin, Total: 3.2 g/dL (ref 2.2–3.9)
IgA: 194 mg/dL (ref 61–437)
IgG (Immunoglobin G), Serum: 1476 mg/dL (ref 603–1613)
IgM (Immunoglobulin M), Srm: 92 mg/dL (ref 20–172)
M Protein SerPl Elph-Mcnc: 0.4 g/dL — ABNORMAL HIGH
Total Protein ELP: 6.7 g/dL (ref 6.0–8.5)

## 2023-01-08 ENCOUNTER — Encounter: Payer: Self-pay | Admitting: Cardiology

## 2023-01-08 ENCOUNTER — Ambulatory Visit: Payer: Medicare Other | Admitting: Cardiology

## 2023-01-08 VITALS — BP 139/79 | HR 54 | Resp 16 | Ht 70.0 in | Wt 213.0 lb

## 2023-01-08 DIAGNOSIS — I25118 Atherosclerotic heart disease of native coronary artery with other forms of angina pectoris: Secondary | ICD-10-CM

## 2023-01-08 DIAGNOSIS — E782 Mixed hyperlipidemia: Secondary | ICD-10-CM

## 2023-01-08 NOTE — Progress Notes (Signed)
Patient referred by Antony Contras, MD for cardiac risk stratification  Subjective:   Samuel [name], male    DOB: Jan 25, 1956, 67 y.o.   MRN: [id number]   Chief Complaint  Patient presents with   Coronary Artery Disease   Follow-up    4-6 week   Results     HPI  67 y/o Caucasian male with multiple myeloma, interstitial lung disease, drug-induced pneumonitis, coronary calcification  Reviewed recent test results with the patient, details below.  Patient has not had any new symptoms.  However, he remains concerned about his risk of CAD, given his family history and CT chest findings of coronary calcification.   Initial consultation visit 08/2021: Patient is going to undergo lung biopsy under general anesthesia. he was referred for cardiac risk stratification prior to the same.  Patient is here with his wife today.  He is a Music therapist, continues to work regularly.  He has been fairly active up until May 2022.  He was treated with [name] for multiple myeloma, with which his multiple myeloma is reportedly remission.  However, he developed relatively rapid onset of severe central dyspnea.  He has been seeing Dr. [name] since then with working diagnosis of interstitial lung disease versus drug-induced pneumonitis.  His CT scan has consistently showed bilateral groundglass opacities with peribronchovascular reticular opacities, traction bronchiectasis.  Differential reported on CT chest variance sequela of her acute lung injury, NSIP, or fibrotic HP.   There has been incidental findings of aortic calcification in LAD and RCA calcification.  Patient denies any chest pain symptoms.  Prior to May 2022, he did not have any significant exertional dyspnea.  BMP and NT proBNP has been normal the last few months.  Troponin was also normal.  He smoked in college, none since then.  His father did have history of coronary artery disease requiring CABG.  Patient is currently on aspirin and  statin multiple control lipids.     Current Outpatient Medications:    acyclovir (ZOVIRAX) 400 MG tablet, Take 1 tablet (400 mg total) by mouth 2 (two) times daily., Disp: 60 tablet, Rfl: 3   albuterol (PROVENTIL) (2.5 MG/3ML) 0.083% nebulizer solution, Take 3 mLs (2.5 mg total) by nebulization every 6 (six) hours as needed for wheezing or shortness of breath., Disp: 120 mL, Rfl: 12   albuterol (VENTOLIN HFA) 108 (90 Base) MCG/ACT inhaler, Inhale 2 puffs into the lungs every 6 hours as needed for wheezing or shortness of breath., Disp: 8.5 g, Rfl: 1   ALPRAZolam (XANAX) 0.5 MG tablet, Take 0.5 mg by mouth at bedtime., Disp: , Rfl:    ASSESS FULL RANGE PEAK METER DEVI, as directed., Disp: , Rfl:    azelastine (ASTELIN) 0.1 % nasal spray, Place 2 sprays into both nostrils 2 (two) times daily. Use in each nostril as directed, Disp: 30 mL, Rfl: 5   benzonatate (TESSALON) 200 MG capsule, Take 1 capsule (200 mg total) by mouth 3 (three) times daily as needed., Disp: 45 capsule, Rfl: 1   bisoprolol (ZEBETA) 5 MG tablet, Take 1 tablet (5 mg total) by mouth daily., Disp: 30 tablet, Rfl: 2   Budeson-Glycopyrrol-Formoterol (BREZTRI AEROSPHERE) 160-9-4.8 MCG/ACT AERO, Inhale 2 puffs into the lungs in the morning and at bedtime., Disp: 10.7 g, Rfl: 5   Budeson-Glycopyrrol-Formoterol (BREZTRI AEROSPHERE) 160-9-4.8 MCG/ACT AERO, Inhale 2 puffs into the lungs in the morning and at bedtime., Disp: 5.9 g, Rfl: 0   cholecalciferol (VITAMIN D) 1000 UNITS tablet, Take 3,000 Units  by mouth daily., Disp: , Rfl:    Dextromethorphan HBr (DELSYM PO), Take by mouth as needed. For cough, Disp: , Rfl:    dextromethorphan-guaiFENesin (MUCINEX DM) 30-600 MG 12hr tablet, Take 1 tablet by mouth 2 (two) times daily., Disp: , Rfl:    EPINEPHrine (EPI-PEN) 0.3 mg/0.3 mL DEVI, Inject 0.3 mg into the muscle as needed., Disp: , Rfl:    fluticasone (FLONASE) 50 MCG/ACT nasal spray, 1 spray., Disp: , Rfl:    fluticasone-salmeterol  (ADVAIR HFA) 230-21 MCG/ACT inhaler, Inhale 2 puffs into the lungs 2 (two) times daily. Brand name only, Disp: 1 each, Rfl: 12   furosemide (LASIX) 20 MG tablet, TAKE ONE TABLET BY MOUTH AS NEEDED FOR FLUID OR EDEMA, Disp: 14 tablet, Rfl: 1   ketoconazole (NIZORAL) 2 % cream, SMARTSIG:1 Application Topical 1 to 2 Times Daily, Disp: , Rfl:    loratadine (CLARITIN) 10 MG tablet, Take 10 mg by mouth daily., Disp: , Rfl:    montelukast (SINGULAIR) 10 MG tablet, Take 10 mg by mouth at bedtime., Disp: , Rfl:    Multiple Vitamin (MULTIVITAMIN) tablet, Take 1 tablet by mouth daily., Disp: , Rfl:    omeprazole (PRILOSEC) 40 MG capsule, Take 1 capsule by mouth daily., Disp: , Rfl:    OXYGEN, Inhale 2 L into the lungs continuous. As needed, Disp: , Rfl:    PE-diphenhydrAMINE-DM-GG-APAP (DELSYM DAY NIGHT PO), Take by mouth in the morning and at bedtime., Disp: , Rfl:    predniSONE (DELTASONE) 10 MG tablet, 4 tabs for 2 days, then 3 tabs for 2 days, 2 tabs for 2 days, then 1 tab for 2 days, then stop (Patient not taking: Reported on 12/26/2022), Disp: 20 tablet, Rfl: 0   rosuvastatin (CRESTOR) 20 MG tablet, Take 1 tablet (20 mg total) by mouth daily., Disp: 30 tablet, Rfl: 3   sildenafil (REVATIO) 20 MG tablet, Take 20 mg by mouth daily as needed (ED)., Disp: , Rfl:    Simethicone (SIMETHICONE ULTRA STRENGTH) 180 MG CAPS, Take 1 capsule (180 mg total) by mouth 3 (three) times daily as needed., Disp: 90 capsule, Rfl: 0   sodium chloride (OCEAN) 0.65 % SOLN nasal spray, Place 1 spray into both nostrils as needed for congestion., Disp: , Rfl:    tamsulosin (FLOMAX) 0.4 MG CAPS capsule, Take 0.4 mg by mouth daily., Disp: , Rfl:    thyroid (ARMOUR) 120 MG tablet, Take 120 mg by mouth daily before breakfast., Disp: , Rfl:    triamcinolone cream (KENALOG) 0.1 %, Apply 1 application topically daily as needed (sun burn itch)., Disp: , Rfl:   Cardiovascular and other pertinent studies:  Lexiscan Tetrofosmin stress test  12/10/2022: 1 Day Rest/Stress protocol.  Stress EKG is non-diagnostic, as this is pharmacological stress test using Lexiscan. Normal myocardial perfusion without convincing evidence of reversible myocardial ischemia or prior infarct. Left ventricular size normal, thickness preserved, no regional wall motion abnormalities. No prior studies available for comparison. Low risk study.   Echocardiogram 12/05/2022:  Normal LV systolic function with visual EF 60-65%. Left ventricle cavity  is normal in size. Normal global wall motion. Indeterminate diastolic  filling pattern. Calculated EF 66%.  Structurally normal tricuspid valve with trace regurgitation. No evidence  of pulmonary hypertension.  No significant change compared to 09/2011.   CT Chest 12/05/2022: 1. Diffuse reticular opacities with associated traction bronchiectasis. No definite evidence of progression. Fibrotic fibrotic hypersensitivity is a possibility, although there is only mild air trapping. Diffuse ground-glass opacities described on July 30, 2021 and June 15, 2021 priors exam no longer apparent and favored to be due to acute etiology. Findings are indeterminate for UIP per consensus guidelines: Diagnosis of Idiopathic Pulmonary Fibrosis: An Official ATS/ERS/JRS/ALAT Clinical Practice Guideline. [place], Iss 5, [phone], Jun 21 2017. 2. Moderate to severe coronary artery calcifications. 3. Unchanged mildly enlarged mediastinal and hilar lymph nodes, likely reactive. 4. Aortic Atherosclerosis (ICD10-I70.0).    EKG 11/28/2022: Sinus rhythm 56 bpm LVH Borderline left atrial enlargement   CT Chest 11/25/2022: 1. Diffuse reticular opacities with associated traction bronchiectasis. No definite evidence of progression. Fibrotic fibrotic hypersensitivity is a possibility, although there is only mild air trapping. Diffuse ground-glass opacities described on July 30, 2021 and June 15, 2021  priors exam no longer apparent and favored to be due to acute etiology. Findings are indeterminate for UIP per consensus guidelines: Diagnosis of Idiopathic Pulmonary Fibrosis: An Official ATS/ERS/JRS/ALAT Clinical Practice Guideline. [place], Iss 5, [phone], Jun 21 2017. 2. Moderate to severe coronary artery calcifications. 3. Unchanged mildly enlarged mediastinal and hilar lymph nodes, likely reactive. 4. Aortic Atherosclerosis (ICD10-I70.0).  Echocardiogram 06/16/2021:  1. Left ventricular ejection fraction, by estimation, is 65 to 70%. The  left ventricle has normal function. The left ventricle has no regional  wall motion abnormalities. There is mild left ventricular hypertrophy.  Left ventricular diastolic parameters are indeterminate.  Elevated left atrial pressure.   2. Right ventricular systolic function is normal. The right ventricular  size is normal. Tricuspid regurgitation signal is inadequate for assessing  PA pressure.   3. Trivial pericardial effusion is circumferential.   4. The mitral valve is normal in structure. No evidence of mitral valve  regurgitation. No evidence of mitral stenosis.   5. The aortic valve has an indeterminant number of cusps. Aortic valve  regurgitation is not visualized. No aortic stenosis is present.   6. Aortic dilatation noted. There is mild dilatation of the ascending  aorta, measuring 38 mm.     Recent labs: 12/23/2022: Glucose 76, BUN/Cr 20/0.96. EGFR >60. Na/K 134/4.2. Rest of the CMP normal H/H 13/40. MCV 88. Platelets 218  11/20/2022: Glucose 84, BUN/Cr 22/1.04. EGFR >60. Na/K 130/4.5. Rest of the CMP normal H/H 13/39. MCV 88. Platelets 260 TSH 0.1 normal  08/2022: Chol 185, TG 96, HDL 56, LDL 111  09/2021; HbA1C 6.2%    Review of Systems  Cardiovascular:  Positive for dyspnea on exertion. Negative for chest pain, leg swelling, palpitations and syncope.         Vitals:   01/08/23 0948  BP:  139/79  Pulse: (!) 54  Resp: 16  SpO2: 98%     Body mass index is 30.56 kg/m. Filed Weights   01/08/23 0948  Weight: 213 lb (96.6 kg)     Objective:   Physical Exam Vitals and nursing note reviewed.  Constitutional:      General: He is not in acute distress. Neck:     Vascular: No JVD.  Cardiovascular:     Rate and Rhythm: Normal rate and regular rhythm.     Heart sounds: Normal heart sounds. No murmur heard. Pulmonary:     Effort: Pulmonary effort is normal.     Breath sounds: Wheezing present. No rales.  Musculoskeletal:     Right lower leg: No edema.     Left lower leg: No edema.         Assessment & Recommendations:  67 y/o Caucasian male with multiple myeloma, interstitial lung disease, drug-induced pneumonitis, coronary calcification  Coronary calcification: Moderate coronary calcification (CT chest 10/2022). No ischemic on stress testing (11/2022). Structurally normal heart (11/2022). No anginal symptoms. Exertional dyspnea is most likely due to ILD.  I discussed the pathophysiology of coronary calcification, and reassuring findings of the stress testing.  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  At this time, I recommend prevention strategies and risk factor modification. If no contraindication, recommend aspirin 81 mg daily.  Patient would like to check this with his hematologist Dr. [name].  I will send my note to Dr. Denman George as well. Check lipid panel.  If LDL <70, continue Crestor 20 mg daily.  If >70, increase to 40 mg daily.  F/u in 6 months    Nigel Mormon, MD Pager: [phone] Office: [phone]

## 2023-01-17 ENCOUNTER — Other Ambulatory Visit: Payer: Self-pay

## 2023-01-17 DIAGNOSIS — J849 Interstitial pulmonary disease, unspecified: Secondary | ICD-10-CM

## 2023-01-17 DIAGNOSIS — R0609 Other forms of dyspnea: Secondary | ICD-10-CM

## 2023-01-20 ENCOUNTER — Inpatient Hospital Stay: Payer: Medicare Other | Attending: Hematology and Oncology

## 2023-01-20 ENCOUNTER — Encounter: Payer: Self-pay | Admitting: Internal Medicine

## 2023-01-20 ENCOUNTER — Other Ambulatory Visit: Payer: Self-pay | Admitting: Hematology and Oncology

## 2023-01-20 ENCOUNTER — Ambulatory Visit (INDEPENDENT_AMBULATORY_CARE_PROVIDER_SITE_OTHER): Payer: Medicare Other | Admitting: Internal Medicine

## 2023-01-20 ENCOUNTER — Other Ambulatory Visit: Payer: Self-pay

## 2023-01-20 VITALS — BP 140/90 | HR 60 | Ht 70.0 in | Wt 214.2 lb

## 2023-01-20 DIAGNOSIS — J45909 Unspecified asthma, uncomplicated: Secondary | ICD-10-CM | POA: Diagnosis not present

## 2023-01-20 DIAGNOSIS — Z87891 Personal history of nicotine dependence: Secondary | ICD-10-CM | POA: Insufficient documentation

## 2023-01-20 DIAGNOSIS — Z9221 Personal history of antineoplastic chemotherapy: Secondary | ICD-10-CM | POA: Diagnosis not present

## 2023-01-20 DIAGNOSIS — Z79624 Long term (current) use of inhibitors of nucleotide synthesis: Secondary | ICD-10-CM | POA: Diagnosis not present

## 2023-01-20 DIAGNOSIS — J849 Interstitial pulmonary disease, unspecified: Secondary | ICD-10-CM | POA: Diagnosis not present

## 2023-01-20 DIAGNOSIS — Z79899 Other long term (current) drug therapy: Secondary | ICD-10-CM | POA: Diagnosis not present

## 2023-01-20 DIAGNOSIS — C9000 Multiple myeloma not having achieved remission: Secondary | ICD-10-CM

## 2023-01-20 DIAGNOSIS — R0609 Other forms of dyspnea: Secondary | ICD-10-CM

## 2023-01-20 LAB — PULMONARY FUNCTION TEST
DL/VA % pred: 127 %
DL/VA: 5.25 ml/min/mmHg/L
DLCO cor % pred: 49 %
DLCO cor: 13.27 ml/min/mmHg
DLCO unc % pred: 48 %
DLCO unc: 12.92 ml/min/mmHg
FEF 25-75 Pre: 1.41 L/sec
FEF2575-%Pred-Pre: 52 %
FEV1-%Pred-Pre: 36 %
FEV1-Pre: 1.23 L
FEV1FVC-%Pred-Pre: 121 %
FEV6-%Pred-Pre: 31 %
FEV6-Pre: 1.36 L
FEV6FVC-%Pred-Pre: 105 %
FVC-%Pred-Pre: 29 %
FVC-Pre: 1.36 L
Pre FEV1/FVC ratio: 90 %
Pre FEV6/FVC Ratio: 100 %

## 2023-01-20 LAB — CBC WITH DIFFERENTIAL (CANCER CENTER ONLY)
Abs Immature Granulocytes: 0 10*3/uL (ref 0.00–0.07)
Basophils Absolute: 0 10*3/uL (ref 0.0–0.1)
Basophils Relative: 0 %
Eosinophils Absolute: 0.1 10*3/uL (ref 0.0–0.5)
Eosinophils Relative: 1 %
HCT: 41.8 % (ref 39.0–52.0)
Hemoglobin: 14.4 g/dL (ref 13.0–17.0)
Immature Granulocytes: 0 %
Lymphocytes Relative: 16 %
Lymphs Abs: 1.1 10*3/uL (ref 0.7–4.0)
MCH: 29.8 pg (ref 26.0–34.0)
MCHC: 34.4 g/dL (ref 30.0–36.0)
MCV: 86.5 fL (ref 80.0–100.0)
Monocytes Absolute: 0.6 10*3/uL (ref 0.1–1.0)
Monocytes Relative: 9 %
Neutro Abs: 5.2 10*3/uL (ref 1.7–7.7)
Neutrophils Relative %: 74 %
Platelet Count: 215 10*3/uL (ref 150–400)
RBC: 4.83 MIL/uL (ref 4.22–5.81)
RDW: 13 % (ref 11.5–15.5)
WBC Count: 6.9 10*3/uL (ref 4.0–10.5)
nRBC: 0 % (ref 0.0–0.2)

## 2023-01-20 LAB — LACTATE DEHYDROGENASE: LDH: 257 U/L — ABNORMAL HIGH (ref 98–192)

## 2023-01-20 LAB — CMP (CANCER CENTER ONLY)
ALT: 18 U/L (ref 0–44)
AST: 26 U/L (ref 15–41)
Albumin: 4.2 g/dL (ref 3.5–5.0)
Alkaline Phosphatase: 57 U/L (ref 38–126)
Anion gap: 6 (ref 5–15)
BUN: 18 mg/dL (ref 8–23)
CO2: 29 mmol/L (ref 22–32)
Calcium: 9.8 mg/dL (ref 8.9–10.3)
Chloride: 95 mmol/L — ABNORMAL LOW (ref 98–111)
Creatinine: 0.96 mg/dL (ref 0.61–1.24)
GFR, Estimated: >60 mL/min (ref >60)
Glucose, Bld: 92 mg/dL (ref 70–99)
Potassium: 4.3 mmol/L (ref 3.5–5.1)
Sodium: 130 mmol/L — ABNORMAL LOW (ref 135–145)
Total Bilirubin: 0.4 mg/dL (ref 0.3–1.2)
Total Protein: 8.1 g/dL (ref 6.5–8.1)

## 2023-01-20 NOTE — Progress Notes (Signed)
Spiro/DLCO performed today.  

## 2023-01-20 NOTE — Patient Instructions (Signed)
Spiro/DLCO performed today.  

## 2023-01-20 NOTE — Patient Instructions (Addendum)
ILD  - reschedule visit

## 2023-01-20 NOTE — Progress Notes (Signed)
IOV 10/04/2011  67 year old Samuel [name]. Allergies and possible asthma. Hypothyroidism. Anxiety state but no formal diagnosis. Has history of claustrophobia as well Non-smoker. Music therapist. Dad with CAD - s/p CABG at age 42 (now 29y)  Referred by Dr Donneta Romberg. Reports dyspnea. INsidious onset "all my life". Increased after starting allergy shots in June 2012. Says during hikes after he gets going it gets better. Same in gym. Notices more when he is starting out or stationary at rest. HE is not sure what it is.  Feels he needs to take a deep breath on occasion. So, now referred to cardiology and pulmonary. Says ICS Qvar in august/sept 2012 made it worse. Outside chart mentions asthma but patient says not sure if he has asthma. But reports childhood hx of asthma diagnosed by a pediatrician at age 84. Says he had similar symptoms.  Up until 1990 used to 10K but even back then had similar symptoms and would need to warm up before feeling better. Then stopped running due to neck problems. Similar during swimming exercise in 1996-1997. Quit swimming 1999. Episodes associated with wheezing but no cough. Unclear if albuterol helps but on xopenex prn esp before walking. This whole thing feels like he is not getting "enough juice". Never had PFT but recollects normal spirometry at Dr Rush [place] office.  He has seen Dr Gwendel Hanson cardiology for above - treadmill and echo and carotid doppler all pending. Current pulmonary modulating drug is Singulair only  Chest x-ray 07/16/2011 is clear per my personal review.  xxxxxxxxxxxxxxxxxxxxxxxxxxxxxxxxxxxxxxxxxxxxxxxxxxxxxxxxxxxxxxxxxx  Inpatient consult 06/16/21 66 y/o Samuel [name] with a history of multiple myeloma diagnosed in 2022, currently undergoing treatment presented to the [facility] emergency department from his hematology oncology appointment with a chief complaint of dyspnea.  The patient smoked cigarettes briefly as a young adult but since then has lived a relatively  healthy lifestyle staying active with exercise and playing golf.  He was diagnosed with multiple myeloma in the spring 2022 when a bruise was discovered while he was receiving a sports massage.  He went for further work-up and was found to have abnormal lab work, this led to a bone marrow biopsy confirming diagnosis of multiple myeloma.  He has been followed by Dr. [name] who has been treating him with VRd chemotherapy since Mar 16, 2021.  He is currently in the middle of his fifth cycle.     Over the last several weeks he has been developing progressive shortness of breath with exertion.  He says this has been going on for about 3 to 4 weeks and has significantly worsened in the last week.  This is associated with a dry, rare cough.  He denies fevers, chills, leg swelling, weight gain, or mucus production.  In the last several days (just prior to admission) [place] and while there felt significant shortness of breath so he presented to Dr. Libby Maw office for further evaluation.  He was sent to the emergency room for evaluation further because of the severity of his shortness of breath.  In the emergency department he was noted to have hypoxemia to an O2 saturation of 87% and an abnormal chest exam.  Pulmonary and critical care medicine was consulted for further evaluation.  He says that throughout his adult life he has had what he calls "huffing" when he talks while exerting himself or with exertion.  He says he has been followed by an allergist and has been treated for asthma at  times over the years.  He does not regularly use an inhaler and he says this is not something that he routinely needs.  He was seen by my partner Dr. [name] at 1 point in 2013 during which time he had a normal chest x-ray and normal lung function testing.   He had COVID in April 2022.   June 15, 2021 admission August 26 CT chest images independently reviewed: No pulmonary embolism, diffuse interlobular septal  thickening, pleural nodularity noted, areas of groundglass opacification with some patchy distribution particularly in the lung base, no significant pleural effusion, mild reactive appearing lymphadenopathy in the mediastinum Acute respiratory failure with hypoxemia in the setting of diffuse parenchymal lung disease of undetermined etiology: This is somewhat of a complex case given his underlying asthma and the fact that he had COVID in 2022 and in April 2022 a CT scan of his abdomen showed some ill-defined interstitial changes in the periphery of his lungs on lung windows in the bases.  Based on the time course of his illness and reports in the literature I am most concerned about [name] lung toxicity (see citation below), though it is also important to evaluate for another underlying and progressive primary pulmonary ILD or less likely an opportunistic infection (history doesn't seem to support this).  [name] can cause eosinophilic pneumonia which we can assess for with a bronchoscopy, but his imaging characteristics aren't typical for this.  It is possible that the non-specific interstitial changes seen on his lungs in April 2022 were related to his recent COVID infection.  I don't think his baseline asthma explains his symptoms, though he did have a high serum eosinophil count a few weeks prior to admission (unclear significance).  Saglam B, Paulita Fujita M, Ornek S, Keske S, Tabak L, Cakar N, Zeren H, Aytekin S, [place], Ferhanoglu B. Bortezomib induced pulmonary toxicity: a case report and review of the literature. Am J Blood Res. 2020 Dec 15;10(6):407-415. PMID: 85885027; PMCID: [id number].  06/18/21 -   Results for Samuel [name], Samuel [name]" (MRN [id number]) as of 07/12/2021 09:13  Ref. Range 06/18/2021 12:57  Monocyte-Macrophage-Serous Fluid Latest Ref Range: 50 - 90 % 5 (L)  Other Cells, Fluid Latest Units: % CORRELATE WITH CYTOLOGY.  Color, Fluid Latest Ref Range: YELLOW  PINK (A)  Total  Nucleated Cell Count, Fluid Latest Ref Range: 0 - 1,000 cu mm 103  Fluid Type-FCT Unknown BRONCHIAL ALVEOLAR LAVAGE  Lymphs, Fluid Latest Units: % 18  Eos, Fluid Latest Units: % 51  Appearance, Fluid Latest Ref Range: CLEAR  HAZY (A)  Neutrophil Count, Fluid Latest Ref Range: 0 - 25 % 26 (H)  FINAL MICROSCOPIC DIAGNOSIS:  - No malignant cells identified  - Benign bronchial cells and pulmonary macrophages   Titrate off solumedrol and start prednisone 50 mg daily. Decrease by 10 mg weekly    OV 07/12/2021  Subjective:  Patient ID: Samuel [name], Samuel [name] , DOB: 1956-08-06 , age 66 y.o. , MRN: [id number] , ADDRESS: [place] [id number] PCP Antony Contras, MD Patient Care Team: Antony Contras, MD as PCP - General (Family Medicine)  This Provider for this visit: Treatment Team:  Attending Provider: Brand Males, MD    07/12/2021 -   Chief Complaint  Patient presents with   Hospitalization Follow-up    Pt states he was doing better after being out of the hospital after being placed on steroids. States about a week after being out, states he started having more problems  with SOB and to today, he has had problems with SOB with exertion.   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 -presents for follow-up from the hospital.  I originally met him 10 years ago and then at that point in time he had dyspnea.  He tells me that after that he was exercising and living a good life.  Then in April 2022 got diagnosed incidentally with myeloma multiple.  Says he was caught early.  Then in May 2022 started on [name] and [name].  He says he was doing well with this up until mid July.  Up until then he was walking 1-1/2 2 miles per day 5 days a week.  Then in late July 2022 started noticing shortness of breath.  He met Dr. [name] his oncologist on May 25, 2021.  By June 01, 2021 he had severe pulmonary  infiltrates.  He says a part of then he was getting his [name] once a week along with some steroids with each dose.  At this point in time the steroid dose was cut down but he progressed and started getting worse.  Then on June 15, 2021 he was in significant respiratory distress and admitted to the hospital.  I reviewed the hospital records.  He was there for 3 days.  He underwent bronchoscopy with BAL there is also 51% eosinophilia.  Cultures are negative malignant cells are negative.  He was seen by my pulmonary colleagues.  Discussion was held with the [name] other [name] was the etiology.  Given the use and failure it looks like his [name] has been held.  He was discharged on 50 mg prednisone with advised to taper it by 10 mg/week.  Currently is on 20 mg prednisone.  He started playing golf again and feeling really good through Labor Day weekend..  Then on June 29, 2021 his [name] was not given.  Up until this point all chemo was held.  He was given Cytoxan, Zomenta and [name].  The very next in June 30, 2021 he started feeling worse.  He feels [name] is the etiology for this.  On July 06, 2021 he communicated this with Dr. [name] and all chemo has been held.  He is being referred to the bone marrow transplant program at [facility] for further evaluation.  At this point in time he says he is much better than when he was in the hospital but definitely not as good as he was prior to getting a rechallenge with [name] on June 29, 2021.  But he notices that he slowly improving.  Walking desaturation test shows that his pulse ox is holding up although he does have a tendency to desaturate.   Did extensive review of the literature.  According to up-to-date [name] is a known etiology for pulm eosinophilia but he got worse after getting rechallenged with [name].  Review of the literature shows that [name] can cause drug-induced pneumonitis and a small fraction of patients.  The  literature is  NO INFOR  as to whether these patients have eosinophilia or not but definitely they seem to be steroid responsive.  This literature is attached below.  In the timeframe also fits in with [name].  He is also noticed to be on allopurinol which rarely can cause hypersensitivity syndrome or dermatitis, hepatitis and eosinophilia - ADDRESS Syndrome but he did not have thse features of rash etc     Nevertheless significant eosinophilia in  the BAL 51% suggest drug-induced pneumonitis.  He currently has significant symptoms of shortness of breath.  He does not have oxygen with him at home.  Simple office walk 185 feet x  3 laps goal with forehead probe 07/12/2021    O2 used ra   Number laps completed 3   Comments about pace slow   Resting Pulse Ox/HR 100% and 69/min   Final Pulse Ox/HR 92% and 81/min   Desaturated </= 88% no   Desaturated <= 3% points Yes 8   Got Tachycardic >/= 90/min no   Symptoms at end of test No complaints   Miscellaneous comments x        Causes of Pulmonary Eosinophilia: from UPTODATE A. Nonsteroidal antiinflammatory drugs (NSAIDs) B. Nitrofurantoin  C. Minocycline D. Sulfonamides/ Sulfamethoxazole E. Ampicillin F. Daptomycin G.Vancomycin H. Dapsone I. Beta-lactam antibiotics J. Nevirapine K. Telaprevir L. Sulfasalazine M. Methotrexate N. Mesalamine O. Amiodarone P. Bleomycin Q. ACE Inhibitor R. Beta blocker S. Hydrochlorothiazides T. L- Tryptophan U. Allopurinol V. Carbamazepine W. Lamotrigine X. Phenytoin Y. Phenindione Z. Fluindione AA. Olanzapine AB. Oxcarbazepine AC. Strontium Ranelate AD. Lenalidomide AE. Radiographic contrast media  Saglam B, Kalyon H, Ozbalak M, Ornek S, Keske S, Tabak L, Cakar N, Zeren H, Aytekin S, [place], [place] B. Bortezomib induced pulmonary toxicity: a case report and review of the literature. Am J Blood Res. 2020 Dec 15;10(6):407-415. PMID: [id number]; PMCID: [id number]. - Bortezomib  related pulmonary toxicities are rarely reported. Although the incidence of Bortezomib induced lung injury (BLI) is unknown, in a large registry study of 1010 MM patients, 45 patients were reported to have BLI by their physician. However, the causality could only be constituted in 26 patients (2.6%), with 5 of them resulting in death despite steroid treatment.In the case reports, the average number of RVD cycles until the toxicity was presented was 6.9, and the period between the development of pulmonary toxicity and the first dose of Bortezomib was 31.1 days -No mention of eosinophilia  Bortezomib therapy-related lung disease in [place] patients with multiple myeloma: Incidence, mortality and clinical characterization Dyann [facility] Miyao,2 Vidalia Masahiko Kusumoto,5 Storden Fumikazu BOFBP,1 [place] Sugiyama,8 Kiyohiko Hatake,9 [place] Pao Fukuda,10 and [place] patients registered, 81 (4.5%) developed BILD, 5 (0.50%) of whom had fatal cases. The median time to BILD onset from the first bortezomib dose was 14.5 days, and most of the patients responded well to corticosteroid therapy. A retrospective review by the Lung Injury Medical Expert Panel revealed that the types with capillary leak syndrome and hypoxia without infiltrative shadows were uniquely and frequently observed in patients with BIL - no mention of eosinophilia  CellularOperator.fi =- allopurinol complex multisystem Hypersensitivityey   has a past medical history of Asthma, High cholesterol, Perennial allergic rhinitis, Sciatic pain, right, Seasonal allergic rhinitis, and Thyroid disease.   reports that he quit smoki     07/20/2021  - Visit, NP [name]    67 year old Samuel [name] former smoker followed in our office for shortness of breath and interstitial lung disease.  Established with Dr. [name].  Initially  consulted when inpatient with our team in August/2022.  Chief complaint at that point time was dyspnea.  He was diagnosed with multiple myeloma in the spring 2022 when a bruise was discovered while he was recovering from a sports massage.  He went for further work-up and he was found to have abnormal lab work this led to a bone  marrow biopsy confirming diagnosis of multiple myeloma.  He has been followed by Dr. [name].  He was in the middle of treating him with VRD chemotherapy since May/20 04/2021.  He was in the middle of his fifth cycle.  Patient also had [place] in April/2022.  Chest CT in April/2022 showed ill-defined interstitial changes in the periphery.  Concern for [name] lung toxicity.  Patient also had a bronchoscopy performed in 06/18/2021.  Eosinophils 51%, lymphs 18%, no malignant cells identified patient was started on a long-term prednisone taper.  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  Still currently having significant symptoms of shortness of breath.    Last seen in our office on 07/12/2021 by Dr. [name].  Plan of care at that office visit was as follows: Stop [name], check chest x-ray, check CBC with differential, blood ESR, blood IgE, vitamin D, hemoglobin A1c, G6PD, walk test, check Ono at home, change prednisone to 50 mg daily for 2 weeks, then 40 mg daily for 2 weeks, then 30 mg daily for 2 weeks, then 20 mg daily for 2 weeks, 2-week follow-up with APP or Dr. [name], 4-week follow-up with Dr. [name] and 30-minute time slot.  Per chart review on 07/13/2021 [name] was discontinued.  Patient was also seen by Dr. [name] on 07/18/2021 I am unable to view this note.  Patient reporting that weight team would like for him to resume [name] at a maintenance dose of 10 mg.  This is the current plan.  He will see oncology next Friday for blood work.  Patient is scheduled to complete an overnight oximetry test next Tuesday.  He remains  adherent to his prednisone taper 50 mg daily.  Patient reports that this weekend he did play 9 holes of golf on Saturday and on Sunday.  He was limited by his physical exertion.  He also exercised on Tuesday and worked out with a Clinical research associate.  Patient is eager to get back to baseline physical activity.  Patient reports that on Wednesday (07/18/2021) of this week he had increased shortness of breath, cough, worsened acid reflux and he vomited.  Patient believes that this may also be due to the fact that he ate a dinner meal quite quickly.  This sometimes happens when he does this.  He also reports that his blood pressure was high at that time.  Patient reports that he has been off the [name] for at least 1 month.  Patient has stopped his allopurinol as of 07/18/2021.  Patient and spouse are both frustrated regarding dyspnea and have hopes that he would be improving quicker.  There are also concerns that he may have acute worsened symptoms or an acute infection such as bronchitis.  We will discuss and evaluate for this today.  Walk today in office: 07/20/2021-completed 2 laps on room air, dropped to 93%  07/26/2021- Interim hx  Patient presents today for 1 week follow-up. ILD felt to be related to drug pneumonitis from [name] as well as [name] for his treatment of multiple myeloma. BAL showed significant eosinophilia. Dr. [name] lowered dose of [name] to 49m daily. CXR on 07/20/21 showed chronic ILD, no definite acute findings. Ambulatory walk during his last visit showed no oxygen desaturations. During his last visit he was ordered for HRCT, PFTs and ONO.   Accompanied by his wife. He had a bad weekend, his respiratory symptoms have been slightly better the last two days. He is currently off BOTH [name] and [name] (may retry  [name] at lower dose). He stopped using BREO d/t throat irritation. Xanax has helped relieves some anxiety and chest tightness. Wife reports that he is sleeping better. He in  on prolonged prednisone taper. He will be starting 67m prednisone tomorrow  x 2 weeks. He is taking Singulair and generic fluticasone nasal spray. He has been off allergy shots since August. HRCT and PFTs are scheduled for next week. Awaiting results to be faxed for ONO from [employer].    OV 08/02/2021 -   Subjective:  Patient ID: VBillee [name] Samuel [name] , DOB: 131-Aug-1957, age 67y.o. , MRN: [id number], [place]: [id number] SAntony Contras MD Patient Care Team: SAntony Contras MD as PCP - General (Family Medicine)  This Provider for this visit: Treatment Team:  Attending Provider: RBrand Males MD  Type of visit: Telephone/Video Circumstance: COVID-19 national emergency Identification of patient Samuel ARMENDARIZwith 1November 02, 1957and MRN [id number]- 2 person identifier Risks: Risks, benefits, limitations of telephone visit explained. 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 CT abdomen/pelvis: Multiple small lytic lesions in the thoracolumbar spine and bilateral pelvis -SPEP: IgG 2,082 (H), M Protein 1.8 (H). IFE shows IgG monoclonal protein with kappa light chain specificity.  -LDH 169, CBC normal, CMP normal except for sodium 131 (L), Chloride 96 (L).   02/14/2021: Establish care with IDede QueryPA-C 02/22/2021: bone marrow biopsy confirms the diagnosis of Multiple Myeloma with a monoclonal plasma cell population.  03/16/2021: Cycle 1 Day 1 of VRd chemotherapy  04/06/2021: Cycle 2 Day 1 of VRd chemotherapy  04/27/2021: Cycle 3 Day 1 of VRd chemotherapy  05/18/2021: Cycle 4 Day 1 of VRd chemotherapy  06/01/2021: drop  dexamethasone to 244mPO weekly and start lasix due to shortness of breath.  06/15/2021: Desaturation to 87% on ambulation. HELD velcade today and sent to ED for evaluation.  06/22/2021: Findings consistent with drug reaction the lungs with eosinophils on BAL.  Given these findings we will definitely hold [name] and plan to avoid pomalidomide 06/29/2021: Cycle 1 Day 1 CyBorD chemotherapy   HPI ViPatty [name] [age].o. -there is a telephone visit.  He is supposed to see me next week but he had a CT scan of the chest 2 days ago and had pulmonary function test today.  He really wanted to discuss the results today itself.  Review of the records indicate that he still on prednisone taper.  He is able to ambulate and desaturated only after 200 feet.  There are some mild nocturnal desaturation.  We started 2 L of nasal cannula oxygen.  His CT scan of the chest shows diffuse groundglass opacity in a pattern that is inconsistent with UIP [less than 40% chance this is UIP] suggestive of alternate pattern.  He says he is able to do weight training exercises well but when he walks on a treadmill or does ambulation that is when it bothers him.  When he rests he is better.  He does desaturate to 80s percent.  He is wondering if this could be from asthma.  I told him otherwise.  Touch base with Dr. DoLorenso Courieris oncologist.  He is  scheduled for cyclophosphamide tomorrow along with low-dose [name].  This was held off recently in September 2022.  But oncology is wanting to rechallenge him with low-dose [name].  They wanted approval for this.   ONO RA 07/24/21   - - 45 min spent <88%. 7+ hours was > 90%. OVerall not bad  Plan  Start 2L [place] QHS  Results for Samuel [name], Samuel [name]" (MRN [id number]) as of 08/02/2021 12:16  Ref. Range 08/02/2021  08/02/2021   FVC-Pre Latest Units: L 1.55   FVC-%Pred-Pre Latest Units: % 33   Results for JAVONTA, GRONAU" (MRN [id number]) as of 08/02/2021 12:16  Ref. Range  08/02/2021    DLCO cor Latest Units: ml/min/mmHg 12.52   DLCO cor % pred Latest Units: % 46   Results for JAXAN, MICHEL" (MRN [id number]) as of 08/02/2021 12:16  Ref. Range 08/02/2021    TLC Latest Units: L 4.18   TLC % pred Latest Units: % 59    Results for JIRO, KIESTER" (MRN [id number]) as of 08/02/2021 12:16  Ref. Range 07/12/2021 09:54  G-6PDH Latest Ref Range: 7.0 - 20.5 U/g Hgb 14.8   CT Chest data  CT Chest High Resolution  Result Date: 07/31/2021 CLINICAL DATA:  Evaluate for interstitial lung disease EXAM: CT CHEST WITHOUT CONTRAST TECHNIQUE: Multidetector CT imaging of the chest was performed following the standard protocol without intravenous contrast. High resolution imaging of the lungs, as well as inspiratory and expiratory imaging, was performed. COMPARISON:  Chest CT dated June 15, 2021; abdomen and pelvis CT dated February 02, 2021 FINDINGS: Cardiovascular: Cardiomegaly with trace pericardial effusion. Coronary artery calcifications of the RCA and LAD. Atherosclerotic disease of the thoracic aorta. Mediastinum/Nodes: Esophagus is unremarkable. Atrophic thyroid. Mediastinal lymph nodes are decreased in size when compared with prior exam. Reference AP window lymph node on series 2, image 42 measures 1.1 cm in short axis, previously 1.3 cm. Lungs/Pleura: Central airways are patent. Images are motion degraded. Mild diffuse ground-glass opacity with peribronchovascular and subpleural reticular glass opacities and traction bronchiectasis. No clear craniocaudal predominance. Mild bilateral air trapping. 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 Stable solid right middle lobe pulmonary nodule measuring 3 mm on series 3, image 57. Upper Abdomen: No acute abnormality. Musculoskeletal: No chest wall mass or suspicious bone lesions identified. IMPRESSION: Limited evaluation due to respiratory motion artifact. Within limitations, there are diffuse bilateral  ground-glass opacities with peribronchovascular and subpleural reticular opacities, traction bronchiectasis and no clear craniocaudal predominance. Differential considerations include sequela of acute lung injury, NSIP, or fibrotic HP given presence of air trapping. Mild subpleural reticular opacities were present on visualized portions of the lung on prior abdomen and pelvis CT dated February 02, 2021, although majority of findings are new. Findings are suggestive of an alternative diagnosis (not UIP) per consensus guidelines: Diagnosis of Idiopathic Pulmonary Fibrosis: An Official ATS/ERS/JRS/ALAT Clinical Practice Guideline. [place], Iss 5, [phone], Jun 21 2017. Small solid pulmonary nodule the right middle lobe measuring 3 mm. No follow-up needed if patient is low-risk. Non-contrast chest CT can be considered in 12 months if patient is high-risk. This recommendation follows the consensus statement: Guidelines for Management of Incidental Pulmonary Nodules Detected on CT Images: From the Fleischner Society 2017; Radiology 2017; 284:228-243. Aortic Atherosclerosis (ICD10-I70.0). Electronically Signed   By: Yetta Glassman M.D.   On: 07/31/2021 12:01         08/16/2021 -  fu drug induced  pneumonitis   HPI Samuel [name] 67 y.o. -26md desaturated. STarted on portable o2 since yesterday and Is feeling better. On Room air say he is desaturated.  Called to discuss Bronch . He is scheduled to see DR PAtwardhan 08/22/21 wed at 8.30am. PRefers not to have bronch done at that that tme.  Discussed the consensus about having bronchoscopy with lavage to rule out any opportunistic infections.  At this time I took the opportunity of also recommending transbronchial biopsies.  He did have transbronchial biopsies when he was a lot more hypoxemic in August 2022 we will send for histopathology and was nondiagnostic.  This time I recommended we send it off for RNA genomic classifier for UIP it is  not a sensitive test but it is specific.  If it comes back positive then we would know that there is permanency to this and also its a marker of progression potentially.  He is willing to go through this.  Explained we under general anesthesia. Based on schedule will be myself or Dr. IValeta Harmsdoing it.  Explained the following risks   Risks of pneumothorax, hemothorax, sedation/anesthesia complications such as cardiac or respiratory arrest or hypotension, stroke and bleeding all explained. Benefits of diagnosis but limitations of non-diagnosis also explained. Patient verbalized understanding and wished to proceed.    We then discussed pulse dose steroids.  Told him we will have to wait close to a week to make sure there is fungal smears PCP and AFB smears and bacterial culture negative.  At that point we will have to take a decision on giving 1 g Solu-Medrol for the a day for 3 days.  Ideally would need admission he does not want this.  He wants to do with is on an outpatient setting ideally.  Explained to him about the risks with steroids such as hyperglycemia, hypertension but said that I would try to work with the DME company or outpatient/home nursing to see if this would be possible.  He was appreciative.  Also discussed with Dr. IValeta Harmswho is willing to do the biopsy based on schedule of the operating room, myself and the patient if things do not work out.  Sent a secure chat to Dr. PVirgina Jock  Told him about the patient.  He will give u[place] clearance on 08/22/2021.  Recommended also look for BNP and heart failure.  CT Chest data  No results found.   OV 08/09/2021  Subjective:  Patient ID: VBillee [name] Samuel [name] , DOB: 103-Dec-1957, age 67y.o. , MRN: [id number], [id number] [id number] SAntony Contras MD Patient Care Team: SAntony Contras MD as PCP - General (Family Medicine)  This Provider for this visit: Treatment Team:  Attending Provider: RBrand Males  MD    08/09/2021 -   Chief Complaint  Patient presents with   Follow-up    Pt states he is about the same since last visit, maybe a little better. Pt is coughing more at times and states he does cough a lot before bed.   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 -returns for follow-up.  He is finishing up 40 mg of prednisone per day.  Restarting 30 mg/day of prednisone tomorrow.  He is not really better.  Today he was able to only walk 2 out of the customary 3 laps.  And he showed a tendency to desaturate.  He says  he is extremely anxious.  This is understandable.  He also states that when he lifts weights in the gym he does not have a problem but when he exerts himself he feels worse.  Previously his nocturnal desaturation test showed abnormality and we recommended night oxygen but he declined per the CMA.  But today he and his wife tell me that they would be interested in getting some oxygen if it would help with shortness of breath.  Simple walking desaturation test does not make him qualify and will need a 6-minute walk test to qualify for portable oxygen.  They are also worried about the lack of improvement.  They are wondering about second opinion.  I did unofficially check with some of my colleagues in the [place].  No clear-cut plan has been developed.  We discussed about the possibility of visiting Dr. Virgel Manifold, ILD group in [place].  They seemed enthused about the idea but wanted me to reach out to Dr. Ovid Curd first.  In terms of his myeloma review of the medical records from office visit 08/03/2021 with Dr. [name] and talking to the patient and the labs show the myeloma still in remission.  But Dr. [name] is extremely concerned that the myeloma will come back.  Oncology still wants to rechallenge him with [name] but after my personal discussion with Dr. [name] on  08/02/2021 and concern for pulmonary toxicity chemotherapy is on hold till patient recovers.  He is also concerned about the presence of coronary artery calcification on his recent CT scan of the chest.  His dad had coronary artery disease diagnosed at 17 and had bypass.  He wants to visit this with Dr. Einar Gip again       08/31/2021 -video visit to discuss bronchoscopy results from 08/28/2021 and next step in plan    HPI Samuel [name] 67 y.o. -his bronchoscopy with lavage 08/28/2021 shows 40% lymphocytes.  Anything greater than 30% is against UIP.  His RNA genomic classifier is in progress and that would be a specific test although not a ensitive test for UIP.  Bacterial cultures are negative.  He continues have shortness of breath and on room air at rest he is fine but sometimes when he goes to the bathroom he can desaturate into the high 80s.  He is frustrated by his condition.  Our original plan was to ensure no opportunistic or bacterial infections [fully understanding that MTB and fungal infections can take 6 weeks] with the current bronchoscopy and to consider bronchoalveolar lavage.  And then based on this we will schedule pulsed dose steroids.    Recently his G6PD is returned is normal.  He is not on Bactrim for prophylaxis.  Current prednisone Is 68m per day.  He prefers outpatient treatment plan.  We discussed the side effects of high-dose steroids including opportunistic infection, anxiety and irritability, hypertension, diabetes, other lab abnormalities.  Explained the upset benefit of potentially improving upon current ILD active inflammatory phase.  He is willing to go through this treatment.  He prefers outpatient.  He will require lab and vital sign monitoring.   his wife wanted to know if this could be sarcoidosis.  She is read some case reports of sarcoidosis and myeloma associated.  I expressed to them the clinical features do not fit in with sarcoidosis.  However we can check for  autoimmune and sarcoid features  Results for TDAXTIN, LEIKER (MRN [id number] as of 08/31/2021 16:38 ENVISIA - NEGATIVE FOR UIP  Ref. Range 06/18/2021 12:57 08/28/2021 16:51  Monocyte-Macrophage-Serous Fluid Latest Ref Range: 50 - 90 % 5 (L) 10 (L)  Other Cells, Fluid Latest Units: % CORRELATE WITH CYTOLOGY. MESOTHELIAL AND BRONCHIAL LINING CELLS  Color, Fluid Latest Ref Range: YELLOW  PINK (A) COLORLESS (A)  Total Nucleated Cell Count, Fluid Latest Ref Range: 0 - 1,000 cu mm 103 183  Fluid Type-FCT Unknown BRONCHIAL ALVEOLAR LAVAGE BRONCHIAL ALVEOLAR LAVAGE  Lymphs, Fluid Latest Units: % 18 40  Eos, Fluid Latest Units: % 51 0  Appearance, Fluid Latest Ref Range: CLEAR  HAZY (A) CLEAR (A)  Neutrophil Count, Fluid Latest Ref Range: 0 - 25 % 26 (H) 50 (H)    CT Chest data  DG CHEST PORT 1 VIEW  Result Date: 08/28/2021 CLINICAL DATA:  Status post bronchoscopy. EXAM: PORTABLE CHEST 1 VIEW COMPARISON:  Chest radiographs 07/20/2021 and CT 07/30/2021 FINDINGS: The cardiac silhouette is borderline enlarged. Lung volumes are chronically low and slightly lower than on the prior radiographs. The interstitial markings are chronically increased diffusely. No definite acute airspace consolidation, overt pulmonary edema, sizable pleural effusion, or pneumothorax is identified. Prominent gaseous distension of the stomach is partially visualized. IMPRESSION: Low lung volumes with chronic interstitial changes. Electronically Signed   By: Logan Bores M.D.   On: 08/28/2021 19:10   DG C-ARM BRONCHOSCOPY  Result Date: 08/28/2021 C-ARM BRONCHOSCOPY: Fluoroscopy was utilized by the requesting physician.  No radiographic interpretation.       OV 09/25/2021  Subjective:  Patient ID: Samuel [name], Samuel [name] , DOB: 19-Apr-1956 , age 37 y.o. , MRN: [id number] , ADDRESS: [place] [id number] PCP Antony Contras, MD Patient Care Team: Antony Contras, MD as PCP - General (Family  Medicine)  This Provider for this visit: Treatment Team:  Attending Provider: Brand Males, MD    09/25/2021 -   Chief Complaint  Patient presents with   Follow-up    Pt states that he is beginning to feel better after last visit. States he wears his O2 at 2L majority of the time.  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 JNarda Rutherford- 461monce weekly (for multiple myeloma chemotherapy N/V ppx)   04/09/21 - dexamethasone 50m49mabs - 40 tabs for 28 day supply - Dr. JohNarda Rutherford10m11mce weekly  (for multiple myeloma chemotherapy N/V ppx)   05/07/21 - dexamethasone 50mg 51ms - 40 tabs for 28 day supply - Dr. John Narda Rutherfordmg 101m weekly  (for multiple myeloma chemotherapy N/V ppx)   06/06/21 - dexamethasone 50mg ta15m- 40 tabs for 28 day supply - Dr. John DoNarda Rutherfordeased to 20mg on77meekly  06/18/21 - prednisone 10mg tab62m104 tabs for 34 day supply - Dr. Jennifer Dessa Phimg dail38m7 days, 40 mg daily x 7 days, 30 mg daily x 7 days, 20mg daily36m days, 10mg daily 71mdays, 5mg daily x 90mays   07/13/21 - prednisone 10mg tabs - 270mab for 30 day supply - Dr. Amariss Detamore ----Chase Callerily x 153mys, 40 mg daily x 14 days, 30mg daily x 1468ms, 20mg daily there35mr  - Mid Nov 2022  - 1gm solumedrol load x 3 days  as outpatient  - 09/25/2021 - 59m pred per day   HPI VGaberial CadaTosco 67y.o. -returns for follow-up.  Since his last visit we did a loading dose of Solu-Medrol 1 g daily x3 days.  This was in mid November 2022.  After that he is gone back to daily prednisone 20 mg/day.  In the midst of the high-dose steroid he did pick up hypertension and we gave him bisoprolol which he says has helped him significantly.   He is run out of the bisoprolol.  I have asked him to contact his primary care physician to manage his hypertension but we will give him a refill.    He is here for follow-up to see his current status.  He tells me that his effort tolerance is better.  He tells me in the gym he is able to do a little bit more work.  This is compared to a few months ago.  He does tell me that gym exercises are easier on him than climbing the stairs.  Stairs - he avoids and gets dyspneic. Not tested his pulse ox on stairs His subjective symptom profile is slightly better compared to October 2022 but his walking desaturation test is around the same.   So suspect amount of his interstitial lung disease might be better but suspect still remains.  Review of his pulmonary function test from 10 years ago was normal.  In April 2022 he had early ILD.  He currently definitely has ILD.  His RNA genomic classifier is negative.  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  This would make him eligible for nintedanib. He continues on prednisone 20 mg/day.  In terms of his myeloma: He had his wife say that it is still under remission but they are worried about relapse.  They are worried about future direction and treatment of myeloma particularly because he has had issues with treatment that then resulted in acute lung injury.   Of note  - he is frustrated by poor customer service of our office workflows - cChristella Scheuermanndenies his RNA genomic classifer biopsy    OV 10/19/21  S: call to give update on conversation with Dr DLorenso Courierhis hematologist  1. Myeloma - latest dec 2022 blood work back - still under remission. Dr DLorenso Courierindicated that highly unlikely he will be a BMT Candidate for myeloma if his lung. [facility] If not a [facility] candidate - then cytoxan regimen short term would be used (indicated that is of ptioential benefit to lungs)  2. 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 Would need SLB to ID etiology preciesly but with myelom and Thereapuetic trial with steroids  and his presentation - this has not been a consideratgion till now.  Explained to Dr DLorenso Courierif ILD progresses futher - patient life expectancy is limited. Discussed with patient again and he is agreeable for prednisone 166mper day and starting ofev (awaiting donor samples and insurance proces in 2023).   3. WE discussed [facility] to see if lung transplant would be an option at all if he were to decline esp in  setting of myeloma. Maybe a BMT as well . Do not know answers but wil email Dr Doy Mince at [facility] to get patient in for visit. He might well need a surgical lung biopsy but this can be addressed in due course  Patient and wife agreeable  I spent time emailing Dr Serita Grit trasnplant doc at [facility] - later heard from Dr Doy Mince- feels that Myleoma will need to be in remssion for >= 5 years before he can be considered lung transplant evaluable. They feel no need to see Mr Dubin in transplant clinicl. I subsequetly d/w Mr Zulauf - will revert back to holding off lung transplant evaluation. He wil proceed with ofev. He will see [facility]MT team. Will cosndier a [facility] opinion after d;w hi     2nd Pulm Opinion at [facility] - Dr Kathi Ludwig    Comment: The patient seems to have an inflammatory process that has resulted in progressive loss of lung function, worsening hypoxia. Unfortunately on the most recent imaging I do see some signs of fibrosis including a few areas of traction bronchiectasis although I do not see honeycombing or profuse traction bronchiectasis. We did discuss  future therapies. I told him that unfortunately since his lung has already suffered injury I think he would be at risk for developing lung injury again and that many chemotherapeutics have been associated lung injury. I also told him these are idiosyncratic reactions and it was not possible to predict who on an individual basis would develop inflammation from chemotherapy to any specific agent. He brought up Cytoxan I told him that while Cytoxan issues for inflammatory lung disease on the other hand it has been associated with pulmonary inflammation. His inflammatory process is steroid responsive which is not unexpected given that eosinophils were found on BAL.  We will check oxygen assessment with exercise to see how much oxygen as needed with more than ordinary exertion.  Plan:    - Check oxygen assessment  Will discuss with his pulmonary provider Dr. [name] and then Dr. Feliciana Rossetti  Thank you for the opportunity to provide consultation for your patient. If I can be of further assistance please do not hesitate to contact my office.   [place] [facility]   ASSESSMENT / PLAN  1. Interstitial lung disease-severe DLCO reduction (33% predicted) 2. 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 [name] induced)-started August 2022 3. Faint/early ILD present in April 2022 (even prior to starting myeloma treatment) 4. Hypothyroidism-on replacement with Armour thyroid 5. Dyslipidemia 6. History of gout 7. Chronic allergic rhinitis and some sinusitis-on nasal steroids 8. Childhood history of asthma 9. 25 year history of allergy shots (between ages 26 and 76)  2. Multiple myeloma-diagnosed April 2022-received about 5 cycles of [name]/[name]/dexamethasone until August 2022    There definitely appeared to be some very early changes of ILD even back in April 2022 prior to him starting any of his myeloma treatments. However, there definitely appears to be an acute interstitial pneumonitis type  picture noted on his CT scan from 06/15/2021. He was taken off all myeloma treatments at that time with concern for drug-induced pneumonitis with both [name] as well as [name] being considered probably agents. He subsequently underwent 1 additional cycle of myeloma treatment with [name] alone (without [name]) and had an acute exacerbation of dyspnea symptoms and ever since then has not received any further myeloma treatment.   I discussed with him that he has been on steroids since about  August 2022 and received a steroid bolus in November 2022 with very little change overall in his lung functions. I have mentioned his serial lung function numbers in the HPI above. I do not see any significant change in these numbers despite him receiving excellent care with Dr. [name] in [place].   His CT chest currently shows mostly fibrotic changes with maybe some active inflammatory component still left (although the extent of active inflammation appears to be very little as compared to the initial scan from August 2022).   I discussed with him that we have very little in terms of treatments to offer him. I do not think the addition of an IL 5 inhibitor or dupilumab would be of any benefit in this situation given that his BAL eosinophil count in November was down to 0% on steroids along with the fact that his current CT scan does not show much for active inflammation. Dr. [name] had discussed Cytoxan with him, but I think that that would be somewhat aggressive given that he is already got an active bone marrow problem with the myeloma and so I would discourage him from getting Cytoxan.  In terms of treatment options, we are really left with only 1 option which is to rechallenge him with high-dose steroids for the next 6-8 weeks and then reassess both his CT scan as well as pulmonary function studies. With this in mind, I have the following plan for him:  1. Increase oral prednisone from his 10 mg a day  that he has been currently using for the past month or so to 60 mg a day. I have outlined to taper for him on the prescription and he will see me back on the 30th of May when he is down to 50 mg a day of prednisone.   2. Continue Bactrim PCP prophylaxis. I have refilled the prescription for him.   3. Continue oxygen supplementation  4. Await the recommendations of our myeloma colleagues in Hematology  5. We cannot refer him for lung transplantation given the active diagnosis of multiple myeloma. This was discussed with him.  Overall, I told him that we likely are dealing with end-stage interstitial lung disease with very little in terms of active inflammation and something that is reversible even with the prednisone challenge I have outlined. He was given a prescription for Ofev by Dr. [name], but could not tolerate that due to diarrhea, nausea as well as vomiting. He has an active prescription for Esbriet, but I told him to hold off on that for the next 8 weeks while he is undergoing the prednisone trial so that we do not have any issues with him throwing up on the prednisone and potentially confusing the assessment of treatment response.  If we do not see any improvement in his lung function on CT scan on May 30th, we will slowly taper and discontinue the prednisone and have him start the Virgil.  With regards to multiple myeloma treatment going forward, I would definitely not challenge him with [name]. I am not so sure about the [name] and if that needs to be given to him in the future, we could potentially consider that under the cover of prednisone immunosuppression. This will need to be careful discussion between his Hematology and Pulmonary team given his severe interstitial lung disease in the lack of any wiggle room if he were to decline/have a flare of pneumonitis.  All questions were answered. The patient was satisfied with the visit. No learning  barriers identified during the  visit.  OV 12/04/2021  Subjective:  Patient ID: Samuel [name], Samuel [name] , DOB: [birth year] , age 53 y.o. , MRN: [id number] , ADDRESS: [place] [phone] PCP Antony Contras, MD Patient Care Team: Antony Contras, MD as PCP - General (Family Medicine)  This Provider for this visit: Treatment Team:  Attending Provider: Brand Males, MD  History of COVID-19 in the April  2022 Undiagnosed early ILD in the April 2022 Drug-induced interstitial lung disease - progressive phenotype - Aug 2022 BAL: pulm eosinophilia 51% in the setting of multiple myeloma chemotherapy - aug 2022  - Nov 2022 - BAL 40% lympocytoss 0% eos  - declined by [facility] for transplant eval dec 2022 - needs 5 year sof myelmoma remission    Prednisone history: 03/05/21 - dexamethasone 17m tabs - 40 tabs for 28 day supply - Dr. JNarda Rutherford- 455monce weekly (for multiple myeloma chemotherapy N/V ppx)   04/09/21 - dexamethasone 53m53mabs - 40 tabs for 28 day supply - Dr. JohNarda Rutherford39m32mce weekly  (for multiple myeloma chemotherapy N/V ppx)   05/07/21 - dexamethasone 53mg 21ms - 40 tabs for 28 day supply - Dr. John Narda Rutherfordmg 39m weekly  (for multiple myeloma chemotherapy N/V ppx)   06/06/21 - dexamethasone 53mg ta46m- 40 tabs for 28 day supply - Dr. John DoNarda Rutherfordeased to 20mg on26meekly  06/18/21 - prednisone 10mg tab253m104 tabs for 34 day supply - Dr. Jennifer Dessa Phimg dail58m7 days, 40 mg daily x 7 days, 30 mg daily x 7 days, 20mg daily2m days, 10mg daily 32mdays, 5mg daily x 76mays   07/13/21 - prednisone 10mg tabs - 262mab for 30 day supply - Dr. Ahava Kissoon ----Chase Callerily x 139mys, 40 mg daily x 14 days, 30mg daily x 1439ms, 20mg daily there72mr  - Mid Nov 2022  - 1gm solumedrol load x 3 days as outpatient  - 09/25/2021 - 20mg pred per day56mLate December 2022/early January 2023: Start nintedanib - 12/04/21 -prednisone 15mg per day      81m/2023 -   Chief Complaint   Patient presents with   Follow-up    Pt states he stopped taking the OFEV a week ago due to having problems with diarrhea, nausea, and some bleeding still. States since he stopped taking it, he has not had any diarrhea the past 2 days and states the bleeding stopped 2 days ago.     HPI Samuel [name] 65 [address] fo[address]follow-up with his wife.  He tells me that in terms of his multiple myeloma [facility] and since then is followed with Dr. Dorsey.  He also saLorenso CourierRodolfo Pascal [facility]. Pascal's notes fromLetta [place]oc[place] this month 2023.  The general feeling that I get is that they are very nervous to do any form of chemotherapy in him for fear of exacerbating his lung disease.  He is extremely worried about this approach.  There is also trepidation about Cytoxan.  He is really worried about what to do if his myeloma came back.  On the other hand he is also worried about his lungs.  He said he did talk to her known physician call Nathan Greenspan [place]ina [place] and [place] use 10 L of oxygen with  exercise.  He wants higher dose concentrator for this.  I was willing to prescribe this.  He is willing to even pay for this out-of-pocket.  He is also recommended sudden breathing exercises.  Patient is trying different breathing exercises with the hope his lungs can heal.  He is aware that he might be dealing with progressive issues.  He was on nintedanib for a month and early February 2023 he called saying he was having diarrhea and also bloody stools.  He has now stopped nintedanib and for the last week he has not had a diarrhea.  For the last few days there is no bloody stools.  He does not want to do this drug again.  The side effects were quite bad.  There is no further bleeding.  He also believes his hypertension resolved or improved after stopping nintedanib.  He did have a colonoscopy 5  years ago and since then has not had any problems.  His next colonoscopy might be in another 5 years.  At this point in time he is able to do treadmill exercise on 5 L and walk 30 minutes 1.7 mph.  Nevertheless when we walked him on room air here in office he desaturated quickly.  It seems like his distance to desaturation is gotten worse.  He is worried about both the myeloma and the interstitial lung disease.  We discussed options about getting further opinions.  He says he wants to go to a place with his extreme expertise about this.  We discussed the idea about having to go out of state including [place], [place] [facility] and the [facility].  I do remember a name of Dr. Stefanie Libel who is professor at [facility] who is an Energy manager in myeloma.  I did mention this name to him.  I have also written to him.  I also written to 1 Dr. Judie Grieve at the pulmonary department at the [facility].  Based on the response we will facilitate a referral.  Meanwhile I did tell him that we need to protect his lungs against fibrosis.  He wants to go down on his prednisone to 10 mg/day because of the side effects of weight gain.  I agreed to do this provisionally.  But I also recommended antifibrotic pirfenidone.  We discussed the side effects of nausea anorexia and occasionally diarrhea.  He wants to reflect on this.  He is willing to meet with the pharmacist on this.  I made a referral.   No results found.    PFT  OV 01/24/2022  Subjective:  Patient ID: Samuel [name], Samuel [name] , DOB: Jan 08, 1956 , age 42 y.o. , MRN: [id number] , ADDRESS: [place] [id number] PCP Antony Contras, MD Patient Care Team: Antony Contras, MD as PCP - General (Family Medicine)  This Provider for this visit: Treatment Team:  Attending Provider: Brand Males, MD    01/24/2022 -   Chief Complaint  Patient presents with   Follow-up    PFT performed 12/10/21. Pt recently had a referral with [facility].  Pt  states he has been doing okay since last visit   Drug-induced pneumonitis follow-up interstitial lung disease  HPI Samuel [name] 67 y.o. -reviewed [facility] notes.  Dr. Judie Grieve also called me last week.  Decided to go with high-dose prednisone because of some groundglass opacities.  Did not want to do concomitant pirfenidone because of potential side effect profile.  Patient has upcoming follow-up appointment Mar 09, 2022 at  [facility] with Dr. Judie Grieve.  Also saw the myeloma specialist.  In case of recurrence some alternatives have been recommended.  He tells me that he is getting more optimistic.  He understands the significance of his disease in the severely but he is doing breathing exercises remaining optimistic.  He is trying an organic diet.  And therefore his positive state of mind is making him feel better.  He does workout on a treadmill at 2.2 mph.  He covers 1.4 miles in 40 minutes.  He uses 8 L of oxygen for this.   Subjective symptom assessment score is documented below in the slightly better from last visit and significantly better compared to October 2022.  Walking desaturation test is improved from 2 months ago but is similar to follow-up 2022  PFT   OV 04/09/2022  Subjective:  Patient ID: Samuel [name], Samuel [name] , DOB: 1956/04/22 , age 55 y.o. , MRN: [id number] , ADDRESS: [place] [id number] PCP Antony Contras, MD Patient Care Team: Antony Contras, MD as PCP - General (Family Medicine)  This Provider for this visit: Treatment Team:  Attending Provider: Brand Males, MD    04/09/2022 -   Chief Complaint  Patient presents with   Follow-up    Pt states he has been doing okay since last visit but states he did get covid 6/6 and then after that he has had a cough.     HPI Samuel [name] 67 y.o. 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  He had a second visit to [facility].  He saw  Dr. Ladon Applebaum for pulmonary.  In terms of his pulmonary status things were deemed to be stable.  He had lung function studies.  His FVC is actually improved to 1.68 L on 03/19/2022 and his DLCO is stable around 10.3./Slightly improved.  Overall his pulmonary function test is stable/trending in the right direction.  He is lost significant amount of weight it is if his improvement could be because of that.  However he was feeling subjectively improved.  According to the [facility] notes pirfenidone is not being recommended because he is stable but he tells me that the decision was left up to him.  Certainly they want him to preserve his lung function and lose more weight.  Today wanted to talk about pirfenidone but in the interim immediately after coming back March 27, 2022 he emailed me saying that he had [place].  He believes he got it at [facility].  He has taken antiviral.  After this he is better but he still having some slightly worse dyspnea on exertion than baseline.  Some slightly more subjective use of oxygen at baseline [baseline exertional pulse ox stable/slightly worse].  More coughing and wheezing than baseline.  In terms of his prednisone therapy for his ILD and this is being tapered currently 7.5 mg/day.  [facility] Dr. Judie Grieve has on a tapering regimen to off but in the middle of this he had increased respiratory symptoms.  Wife is also reporting more sciatica as the prednisone is coming down.  In terms of his hematology he has been seen by [facility] multiple myeloma program and they made specific recommendations that I reviewed.  They are going to be in touch with his local hematologist oncologist Dr. [name] but currently under remission.  Weight loss has been emphasized.  We discussed Ozempic for weight loss.     CT Chest data CT  Chest data  No results found.    PFT    OV 06/06/2022  Subjective:  Patient ID: Samuel [name], Samuel [name] , DOB: 08/23/1956 , age 89 y.o. , MRN: [id number] ,  ADDRESS: [place] [id number] PCP Antony Contras, MD Patient Care Team: Antony Contras, MD as PCP - General (Family Medicine)  This Provider for this visit: Treatment Team:  Attending Provider: Brand Males, MD    06/06/2022 -  incent J Ardizzone 67 y.o. 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  Pt states that he has not been feeling well since getting Covid May 2023 after going to [facility].     HPI Samuel [name] 67 y.o. -Returns for follow-up.  He presents with his wife Shirlean Mylar. 2 weeks ago tapered off prednisone. Same time also at request we started Pirfenidone he has now completed at 2 pills 3 times daily.  He is going to start 3 pills 3 times daily tomorrow.  He says that overall he is not feeling all that well.  His symptom scores of worsened.  He definitely feels more dyspneic.  He is also having worsening cough although his oxygen requirements at home are not any worse.  He is also feeling more tired.  He is having some back pain.  Had MRI and had intrathecal steroid and after that the back pain is better.  He is wondering if all the symptoms are related to coming off prednisone.  At the same time he also states his myeloma is recurring.  His urine M protein is spiking.  He is going to see Dr. [name] in September 2023 and get started on the new regimen recommended by [facility] that is potentially less toxic to the lungs     His pulmonary function test is better than December 2022 seems worse than May 2023 when he was at [facility]  Related to prednisone taper.  His walking desaturation test is stable.          12/04/2022 Follow up : ILD , drug-induced pneumonitis, chronic respiratory failure, asthma Patient returns for a 1 month follow-up.  Patient complains that he continues to have ongoing cough, congestion, nasal drainage,  shortness of breath.  As above patient has underlying interstitial lung disease with drug-induced pneumonitis, felt secondary to medications used for treatment of his multiple myeloma.  Medications were stopped.  Patient was treated with a steroid challenge.  Patient did try Esbriet for brief time in fall 2023 but was unable to tolerate.  Patient also had COVID 19 infection in 2022 and 2023 and felt that his cough worsened after each episode. Patient's wife is also sick at home with an upper respiratory infection.  Has noticed that his cough is been worse over the last week.  Cough is very aggravating.  And affects his quality life.  Worse at night.  Patient does have a dog at home.  But says he is hypoallergenic.  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  Does not have a hot tub or basement.  No birds or chickens.  COVID-19 test and influenza test today in the office are negative.  Patient denies any hemoptysis, chest pain, orthopnea.  Does have some discolored nasal discharge as dark in the morning but clears as the day goes on.  He had similar symptoms last month and was treated with a 14-day  course of antibiotics.  He was also treated in December with antibiotics and steroids.  Patient has been referred to ENT and consult is pending.  He is currently taking Advair twice daily.  Remains on Claritin daily.   OV 12/26/2022  Subjective:  Patient ID: Samuel [name], Samuel [name] , DOB: 03-01-1956 , age 26 y.o. , MRN: [id number] , ADDRESS: [place] [id number] PCP Antony Contras, MD Patient Care Team: Antony Contras, MD as PCP - General (Family Medicine)  This Provider for this visit: Treatment Team:  Attending Provider: Brand Males, MD    12/26/2022 -   Chief Complaint  Patient presents with   Follow-up    Fatigue, cough and chest congestion today.  Has had some improvement.  Esbriet caused dizziness, GI upset and lethargy.      Follow up : ILD ,  drug-induced pneumonitis, chronic respiratory failure, asthma Esbriet stopped in October 2023 due to inability to tolerate due to GI side effects    # IgG Kappa Multiple Myeloma 02/22/2021: bone marrow biopsy confirms the diagnosis of Multiple Myeloma with a monoclonal plasma cell population.   # History of asthma.  Mid February 2020 for mildly elevated IgE and dust mite allergy.  On RAST allergy panel.  HPI Deondrea Harnisch Haberman 67 y.o. -returns for follow-up.  Presents with his wife Shirlean Mylar.  He has been having some cough and congestion.  Nurse practitioner Patricia Nettle in mid February 2024 did RAST allergy panel.  IgE is slightly high.  He has dust mite allergy.  He states he is known to have allergies.  He did have allergy shots for 10 years leading up until the recent illness and then that was stopped.  His wife feels that the allergy shots did help him.  He has met with Dr. Donneta Romberg and they are discussing whether to restart the allergy shots.  He wanted to know if there is any contraindication.  I felt that he should definitely try allergy shots or at least Xolair.  Although I was not sure whether he should undergo skin testing again.  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  He is able to play golf although very slowly.  Walking desaturation test today was stable very slowly.  He has not had a pulmonary function test and this is scheduled in a few weeks.  I offered to postpone this given his overall subjective symptoms stability and walking desaturation test ability but he wanted to keep it and come back and see me again within the next few to several weeks.  In terms of his myeloma reviewed Dr. Narda Rutherford notes and his M protein is less than 0.5.  Therefore he is still on monitoring plan with plans to start [facility] protocol if myeloma were to relapse.  He is nervous about it because of the recent ILD.  We have agreed to discuss this  again if the situation were to arise.     OV 01/20/2023  Subjective:  Patient ID: Samuel [name], Samuel [name] , DOB: May 11, 1956 , age 53 y.o. , MRN: [id number] , ADDRESS: [place] [id number] PCP Antony Contras, MD Patient Care Team: Antony Contras, MD as PCP - General (Family Medicine)  This Provider for this visit: Treatment Team:  Attending Provider: Brand Males, MD    01/20/2023 -   Chief Complaint  Patient presents with   Follow-up    F/up PFT  HPI Zerrick Hanssen Molstad 67 y.o. -as I got ready to see him I fell ill and declined to see him. Needs to be rescheduled.     SYMPTOM SCALE - ILD 08/09/2021 09/25/2021 218# 12/04/2021 222# 01/24/22 222# 04/09/2022 215# 06/06/2022 211# 12/26/2022 214#  Current weight    Treadmill 2.2 mph.  As 1.4 miles over 40 minutes.  Uses 8 L oxygen Post may 2nd visit 03/27/22 -2ndcvoid    O2 use ra ra ra ra 2L eert 2L exert   Shortness of Breath 0 -> 5 scale with 5 being worst (score 6 If unable to do)        At rest 0 0 0 0 0 4   Simple tasks - showers, clothes change, eating, shaving 2 2 1  0/5 0 3   Household (dishes, doing bed, laundry) x na 1 1 1 3    Shopping 3 1 1.5 0/5 1 3    Walking level at own pace 4 2 2 1 1 2    Walking up Stairs 5 4 3  3.5 3 2    Total (30-36) Dyspnea Score 14 9 8.5 6 6 17    How bad is your cough? 3 1 0.5  3.4 5   How bad is your fatigue 0 2 0  3    How bad is nausea 0 0 0  0 5   How bad is vomiting?  0 0 0  0 0   How bad is diarrhea? 0 0 0  0 4   How bad is anxiety? 5 2 1   0.5 0   How bad is depression 2 1 1   0.5 0   Any chronic pain - if so where and how bad x x x  x       Simple office walk 185 feet x  3 laps goal with forehead probe 07/12/2021  08/09/2021  09/25/2021  12/04/2021  01/24/2022  04/09/2022  06/06/2022  12/26/2022   O2 used ra Ra3 ra ra ra ra ra ra  Number laps completed 3 3 but did oly 2 3 3  attempted but stopped at 2 due to deats All 3 las Stopped at [employer] all 3 Stopped at  2  Comments about pace slow slow slow slow Avg pace Avg  slow slow  Resting Pulse Ox/HR 100% and 69/min 100%ad 74 98% and 75 99% RA and59 98% and HR 60 98% and HR 54 98% nand HR 90 98% n HR 73  Final Pulse Ox/HR 92% and 81/min 93% and 87 91% and 91 86% RA and 85 91% and HR 80 (brief 89%) 88% and HR 80 91% and HR 107 90% an HR 79  Desaturated </= 88% no no no yes ni  no   Desaturated <= 3% points Yes 8 Yes, 7 pponts Yes, 7 points Yes, 13 poins Yes 7 points Yes, 10 points Yes 7 poin   Got Tachycardic >/= 90/min no no yes no no  uyes   Symptoms at end of test No complaints Mod-severe dyspnea Mild dyspnea No complaints none Seemed ok Mild dyspnea   Miscellaneous comments x Worse? stable ? worse          PFT     Latest Ref Rng & Units 01/17/2023    2:09 PM 09/26/2022   10:34 AM 08/01/2022    1:57 PM 06/06/2022    1:52 PM 12/10/2021    9:24 AM 10/16/2021    3:56 PM 06/15/2021   10:13 PM  PFT Results  FVC-Pre L 1.36  P 1.32  1.55  1.43  1.49  1.35  1.55   FVC-Predicted Pre % 29  P 28  33  30  32  29  33   FVC-Post L       1.45   FVC-Predicted Post %       31   Pre FEV1/FVC % % 90  P 85  94  88  94  92  83   Post FEV1/FCV % %       83   FEV1-Pre L 1.23  P 1.12  1.46  1.26  1.40  1.24  1.28   FEV1-Predicted Pre % 36  P 32  42  36  40  36  37   FEV1-Post L       1.20   DLCO uncorrected ml/min/mmHg 12.92  P 9.61  10.43  13.19  8.64  9.99  12.00   DLCO UNC% % 48  P 36  39  49  32  37  44   DLCO corrected ml/min/mmHg 13.27  P 9.96  10.58  13.77  9.20  10.04  12.52   DLCO COR %Predicted % 49  P 37  39  51  34  37  46   DLVA Predicted % 127  P 107  110  128  97  109  118   TLC L       4.18   TLC % Predicted %       59   RV % Predicted %       95     P Preliminary result       has a past medical history of Asthma, GERD (gastroesophageal reflux disease), High cholesterol, History of blood transfusion, Hypothyroidism, Interstitial lung disease, Multiple myeloma, Perennial allergic rhinitis,  Pneumonia, Sciatic pain, right, Seasonal allergic rhinitis, and Thyroid disease.   reports that he quit smoking about 40 years ago. His smoking use included cigarettes. He has a 0.30 pack-year smoking history. He has never used smokeless tobacco.  Past Surgical History:  Procedure Laterality Date   BRONCHIAL BIOPSY  06/18/2021   Procedure: BRONCHIAL BIOPSIES;  Surgeon: Leslye Peer, MD;  Location: [facility];  Service: Cardiopulmonary;;   BRONCHIAL BIOPSY  08/28/2021   Procedure: BRONCHIAL BIOPSIES;  Surgeon: Josephine Igo, DO;  Location: [facility];  Service: Cardiopulmonary;;   BRONCHIAL WASHINGS  06/18/2021   Procedure: BRONCHIAL WASHINGS;  Surgeon: Leslye Peer, MD;  Location: [facility];  Service: Cardiopulmonary;;   BRONCHIAL WASHINGS  08/28/2021   Procedure: BRONCHIAL WASHINGS;  Surgeon: Josephine Igo, DO;  Location: [facility];  Service: Cardiopulmonary;;   NASAL SINUS SURGERY     NECK SURGERY  1996   VIDEO BRONCHOSCOPY N/A 06/18/2021   Procedure: VIDEO BRONCHOSCOPY WITH FLUORO;  Surgeon: Leslye Peer, MD;  Location: [facility];  Service: Cardiopulmonary;  Laterality: N/A;   VIDEO BRONCHOSCOPY N/A 08/28/2021   Procedure: VIDEO BRONCHOSCOPY WITH FLUORO;  Surgeon: Josephine Igo, DO;  Location: [facility];  Service: Cardiopulmonary;  Laterality: N/A;    Allergies  Allergen Reactions   Clarithromycin Other (See Comments)    Hiccups   Ofev [Nintedanib] Other (See Comments)    GI bleeding   Penicillins Other (See Comments)    Child hood unsure    Immunization History  Administered Date(s) Administered   Fluad Quad(high Dose 65+) 08/09/2021, 08/01/2022   Influenza Split 10/27/2017   Influenza, High Dose Seasonal PF 12/01/2017, 11/29/2019, 12/19/2020, 12/24/2021  Influenza, Quadrivalent, Recombinant, Inj, Pf 07/28/2019, 08/11/2020   Influenza-Unspecified 11/26/2011, 07/21/2017   PFIZER(Purple Top)SARS-COV-2 Vaccination 12/27/2019, 01/24/2020, 09/21/2020    Tdap 08/20/2005, 09/07/2015   Zoster, Live 11/06/2017, 05/07/2018    Family History  Problem Relation Age of Onset   Hypertension Mother    Allergies Mother    Allergies Father    CAD Father 48   Breast cancer Paternal Grandmother    Hypertension Other    Heart disease Other      Current Outpatient Medications:    acyclovir (ZOVIRAX) 400 MG tablet, Take 1 tablet (400 mg total) by mouth 2 (two) times daily., Disp: 60 tablet, Rfl: 3   albuterol (PROVENTIL) (2.5 MG/3ML) 0.083% nebulizer solution, Take 3 mLs (2.5 mg total) by nebulization every 6 (six) hours as needed for wheezing or shortness of breath., Disp: 120 mL, Rfl: 12   albuterol (VENTOLIN HFA) 108 (90 Base) MCG/ACT inhaler, Inhale 2 puffs into the lungs every 6 hours as needed for wheezing or shortness of breath., Disp: 8.5 g, Rfl: 1   ALPRAZolam (XANAX) 0.5 MG tablet, Take 0.5 mg by mouth at bedtime., Disp: , Rfl:    ASSESS FULL RANGE PEAK METER DEVI, as directed., Disp: , Rfl:    azelastine (ASTELIN) 0.1 % nasal spray, Place 2 sprays into both nostrils 2 (two) times daily. Use in each nostril as directed, Disp: 30 mL, Rfl: 5   bisoprolol (ZEBETA) 5 MG tablet, Take 1 tablet (5 mg total) by mouth daily., Disp: 30 tablet, Rfl: 2   Budeson-Glycopyrrol-Formoterol (BREZTRI AEROSPHERE) 160-9-4.8 MCG/ACT AERO, Inhale 2 puffs into the lungs in the morning and at bedtime., Disp: 10.7 g, Rfl: 5   Budeson-Glycopyrrol-Formoterol (BREZTRI AEROSPHERE) 160-9-4.8 MCG/ACT AERO, Inhale 2 puffs into the lungs in the morning and at bedtime., Disp: 5.9 g, Rfl: 0   cholecalciferol (VITAMIN D) 1000 UNITS tablet, Take 3,000 Units by mouth daily., Disp: , Rfl:    Dextromethorphan HBr (DELSYM PO), Take by mouth as needed. For cough, Disp: , Rfl:    dextromethorphan-guaiFENesin (MUCINEX DM) 30-600 MG 12hr tablet, Take 1 tablet by mouth 2 (two) times daily., Disp: , Rfl:    EPINEPHrine (EPI-PEN) 0.3 mg/0.3 mL DEVI, Inject 0.3 mg into the muscle as  needed., Disp: , Rfl:    fluticasone (FLONASE) 50 MCG/ACT nasal spray, 1 spray., Disp: , Rfl:    furosemide (LASIX) 20 MG tablet, TAKE ONE TABLET BY MOUTH AS NEEDED FOR FLUID OR EDEMA, Disp: 14 tablet, Rfl: 1   ketoconazole (NIZORAL) 2 % cream, SMARTSIG:1 Application Topical 1 to 2 Times Daily, Disp: , Rfl:    loratadine (CLARITIN) 10 MG tablet, Take 10 mg by mouth daily., Disp: , Rfl:    montelukast (SINGULAIR) 10 MG tablet, Take 10 mg by mouth at bedtime., Disp: , Rfl:    Multiple Vitamin (MULTIVITAMIN) tablet, Take 1 tablet by mouth daily., Disp: , Rfl:    omeprazole (PRILOSEC) 40 MG capsule, Take 1 capsule by mouth daily., Disp: , Rfl:    OXYGEN, Inhale 2 L into the lungs continuous. As needed, Disp: , Rfl:    PE-diphenhydrAMINE-DM-GG-APAP (DELSYM DAY NIGHT PO), Take by mouth in the morning and at bedtime., Disp: , Rfl:    rosuvastatin (CRESTOR) 20 MG tablet, Take 1 tablet (20 mg total) by mouth daily., Disp: 30 tablet, Rfl: 3   sildenafil (REVATIO) 20 MG tablet, Take 20 mg by mouth daily as needed (ED)., Disp: , Rfl:    Simethicone (SIMETHICONE ULTRA STRENGTH) 180 MG CAPS, Take  1 capsule (180 mg total) by mouth 3 (three) times daily as needed., Disp: 90 capsule, Rfl: 0   sodium chloride (OCEAN) 0.65 % SOLN nasal spray, Place 1 spray into both nostrils as needed for congestion., Disp: , Rfl:    tamsulosin (FLOMAX) 0.4 MG CAPS capsule, Take 0.4 mg by mouth daily., Disp: , Rfl:    thyroid (ARMOUR) 120 MG tablet, Take 120 mg by mouth daily before breakfast., Disp: , Rfl:    triamcinolone cream (KENALOG) 0.1 %, Apply 1 application topically daily as needed (sun burn itch)., Disp: , Rfl:       Objective:   Vitals:   01/20/23 1443  BP: (!) 140/90  Pulse: 60  SpO2: 96%  Weight: 214 lb 3.2 oz (97.2 kg)  Height: 5\' 10"  (1.778 m)    Estimated body mass index is 30.73 kg/m as calculated from the following:   Height as of this encounter: 5\' 10"  (1.778 m).   Weight as of this encounter: 214  lb 3.2 oz (97.2 kg).  @WEIGHTCHANGE @  Filed Weights   01/20/23 1443  Weight: 214 lb 3.2 oz (97.2 kg)     Physical Exam    VISIT CANCELLEd

## 2023-01-21 LAB — KAPPA/LAMBDA LIGHT CHAINS
Kappa free light chain: 38.9 mg/L — ABNORMAL HIGH (ref 3.3–19.4)
Kappa, lambda light chain ratio: 2.25 — ABNORMAL HIGH (ref 0.26–1.65)
Lambda free light chains: 17.3 mg/L (ref 5.7–26.3)

## 2023-01-22 ENCOUNTER — Other Ambulatory Visit: Payer: Self-pay

## 2023-01-23 LAB — MULTIPLE MYELOMA PANEL, SERUM
Albumin SerPl Elph-Mcnc: 3.7 g/dL (ref 2.9–4.4)
Albumin/Glob SerPl: 1.1 (ref 0.7–1.7)
Alpha 1: 0.3 g/dL (ref 0.0–0.4)
Alpha2 Glob SerPl Elph-Mcnc: 0.8 g/dL (ref 0.4–1.0)
B-Globulin SerPl Elph-Mcnc: 0.9 g/dL (ref 0.7–1.3)
Gamma Glob SerPl Elph-Mcnc: 1.6 g/dL (ref 0.4–1.8)
Globulin, Total: 3.6 g/dL (ref 2.2–3.9)
IgA: 215 mg/dL (ref 61–437)
IgG (Immunoglobin G), Serum: 1670 mg/dL — ABNORMAL HIGH (ref 603–1613)
IgM (Immunoglobulin M), Srm: 90 mg/dL (ref 20–172)
M Protein SerPl Elph-Mcnc: 0.5 g/dL — ABNORMAL HIGH
Total Protein ELP: 7.3 g/dL (ref 6.0–8.5)

## 2023-02-08 ENCOUNTER — Other Ambulatory Visit: Payer: Self-pay | Admitting: Hematology and Oncology

## 2023-02-09 NOTE — Progress Notes (Unsigned)
[facility] [phone]   [phone]  TELEPHONE PROGRESS NOTE  Patient Care Team: Tally Joe, MD as PCP - General (Family Medicine)  Hematological/Oncological History # IgG Kappa Multiple Myeloma 02/02/2021:  Presented to [facility] ED due to right sided flank tenderness with bruising. CT abdomen/pelvis: Multiple small lytic lesions in the thoracolumbar spine and bilateral pelvis -SPEP: IgG 2,082 (H), M Protein 1.8 (H). IFE shows IgG monoclonal protein with kappa light chain specificity.  -LDH 169, CBC normal, CMP normal except for sodium 131 (L), Chloride 96 (L).   02/14/2021: Establish care with Samuel Kaufmann PA-C 02/22/2021: bone marrow biopsy confirms the diagnosis of Multiple Myeloma with a monoclonal plasma cell population.  03/16/2021: Cycle 1 Day 1 of VRd chemotherapy  04/06/2021: Cycle 2 Day 1 of VRd chemotherapy  04/27/2021: Cycle 3 Day 1 of VRd chemotherapy  05/18/2021: Cycle 4 Day 1 of VRd chemotherapy  06/01/2021: drop dexamethasone to  PO weekly and start lasix due to shortness of breath.  06/15/2021: Desaturation to 87% on ambulation. HELD velcade today and sent to ED for evaluation.  06/22/2021: Findings consistent with drug reaction the lungs with eosinophils on BAL.  Given these findings we will definitely hold [name] and plan to avoid pomalidomide 06/29/2021: Cycle 1 Day 1 CyBorD chemotherapy 07/05/2021: HOLD chemotherapy given worsening lung function  Interval History:  Samuel [name] 67 y.o. male with medical history significant for IgG Kappa multiple myeloma who presents for a follow up visit. The patient's last visit was on 11/25/2022 with Dr. Leonides Schanz. He is accompanied by his wife for this visit.  [name] reports he feels like his lungs are in moving "a tad" in the interim since her last visit.  He has been having issues with allergy and asthma the season.  He reports he was last told his lung capacity was at "34%".  He reports that he  will be given taking allergy shots again soon.  He will also be seeing ENT next month in order to evaluate his sinuses.  Overall he is at his baseline level of health with no new bone pain or back pain.  He has not noticed any changes in his urine such as change in color or bubbling.  He denies fevers, chills, night sweats, nausea, vomiting, or diarrhea. He has no other complaints. Rest of the 10 point ROS is listed below.  The bulk of our discussion focused on the neck steps moving forward giving his rise in M protein.  MEDICAL HISTORY:  Past Medical History:  Diagnosis Date   Asthma    GERD (gastroesophageal reflux disease)    High cholesterol    under control.    History of blood transfusion    Hypothyroidism    Interstitial lung disease    Multiple myeloma    Perennial allergic rhinitis    Pneumonia    Sciatic pain, right    Seasonal allergic rhinitis    Thyroid disease     SURGICAL HISTORY: Past Surgical History:  Procedure Laterality Date   BRONCHIAL BIOPSY  06/18/2021   Procedure: BRONCHIAL BIOPSIES;  Surgeon: Leslye Peer, MD;  Location: [facility];  Service: Cardiopulmonary;;   BRONCHIAL BIOPSY  08/28/2021   Procedure: BRONCHIAL BIOPSIES;  Surgeon: Josephine Igo, DO;  Location: [facility];  Service: Cardiopulmonary;;   BRONCHIAL WASHINGS  06/18/2021   Procedure: BRONCHIAL WASHINGS;  Surgeon: Leslye Peer, MD;  Location: [facility];  Service: Cardiopulmonary;;   BRONCHIAL WASHINGS  08/28/2021  Procedure: BRONCHIAL WASHINGS;  Surgeon: Josephine Igo, DO;  Location: [facility];  Service: Cardiopulmonary;;   NASAL SINUS SURGERY     NECK SURGERY  1996   VIDEO BRONCHOSCOPY N/A 06/18/2021   Procedure: VIDEO BRONCHOSCOPY WITH FLUORO;  Surgeon: Leslye Peer, MD;  Location: [facility];  Service: Cardiopulmonary;  Laterality: N/A;   VIDEO BRONCHOSCOPY N/A 08/28/2021   Procedure: VIDEO BRONCHOSCOPY WITH FLUORO;  Surgeon: Josephine Igo, DO;  Location: [facility]  ENDOSCOPY;  Service: Cardiopulmonary;  Laterality: N/A;    SOCIAL HISTORY: Social History   Socioeconomic History   Marital status: Married    Spouse name: Not on file   Number of children: 3   Years of education: Not on file   Highest education level: Not on file  Occupational History   Not on file  Tobacco Use   Smoking status: Former    Packs/day: 0.10    Years: 3.00    Additional pack years: 0.00    Total pack years: 0.30    Types: Cigarettes    Quit date: 10/21/1982    Years since quitting: 40.3   Smokeless tobacco: Never  Vaping Use   Vaping Use: Never used  Substance and Sexual Activity   Alcohol use: Not Currently    Comment: 1 drink daily   Drug use: No   Sexual activity: Not on file  Other Topics Concern   Not on file  Social History Narrative   Not on file   Social Determinants of Health   Financial Resource Strain: Not on file  Food Insecurity: Not on file  Transportation Needs: Not on file  Physical Activity: Not on file  Stress: Not on file  Social Connections: Not on file  Intimate Partner Violence: Not on file    FAMILY HISTORY: Family History  Problem Relation Age of Onset   Hypertension Mother    Allergies Mother    Allergies Father    CAD Father 65   Breast cancer Paternal Grandmother    Hypertension Other    Heart disease Other     ALLERGIES:  is allergic to clarithromycin, ofev [nintedanib], and penicillins.  MEDICATIONS:  Current Outpatient Medications  Medication Sig Dispense Refill   acyclovir (ZOVIRAX) 400 MG tablet Take 1 tablet (400 mg total) by mouth 2 (two) times daily. 60 tablet 3   albuterol (PROVENTIL) (2.5 MG/3ML) 0.083% nebulizer solution Take 3 mLs (2.5 mg total) by nebulization every 6 (six) hours as needed for wheezing or shortness of breath. 120 mL 12   albuterol (VENTOLIN HFA) 108 (90 Base) MCG/ACT inhaler Inhale 2 puffs into the lungs every 6 hours as needed for wheezing or shortness of breath. 8.5 g 1    ALPRAZolam (XANAX) 0.5 MG tablet Take 0.5 mg by mouth at bedtime.     ASSESS FULL RANGE PEAK METER DEVI as directed.     azelastine (ASTELIN) 0.1 % nasal spray Place 2 sprays into both nostrils 2 (two) times daily. Use in each nostril as directed 30 mL 5   bisoprolol (ZEBETA) 5 MG tablet Take 1 tablet (5 mg total) by mouth daily. 30 tablet 2   Budeson-Glycopyrrol-Formoterol (BREZTRI AEROSPHERE) 160-9-4.8 MCG/ACT AERO Inhale 2 puffs into the lungs in the morning and at bedtime. 10.7 g 5   Budeson-Glycopyrrol-Formoterol (BREZTRI AEROSPHERE) 160-9-4.8 MCG/ACT AERO Inhale 2 puffs into the lungs in the morning and at bedtime. 5.9 g 0   cholecalciferol (VITAMIN D) 1000 UNITS tablet Take 3,000 Units by mouth daily.  Dextromethorphan HBr (DELSYM PO) Take by mouth as needed. For cough     dextromethorphan-guaiFENesin (MUCINEX DM) 30-600 MG 12hr tablet Take 1 tablet by mouth 2 (two) times daily.     EPINEPHrine (EPI-PEN) 0.3 mg/0.3 mL DEVI Inject 0.3 mg into the muscle as needed.     fluticasone (FLONASE) 50 MCG/ACT nasal spray 1 spray.     furosemide (LASIX) 20 MG tablet TAKE ONE TABLET BY MOUTH AS NEEDED FOR FLUID OR EDEMA 14 tablet 1   ketoconazole (NIZORAL) 2 % cream SMARTSIG:1 Application Topical 1 to 2 Times Daily     loratadine (CLARITIN) 10 MG tablet Take 10 mg by mouth daily.     montelukast (SINGULAIR) 10 MG tablet Take 10 mg by mouth at bedtime.     Multiple Vitamin (MULTIVITAMIN) tablet Take 1 tablet by mouth daily.     omeprazole (PRILOSEC) 40 MG capsule Take 1 capsule by mouth daily.     OXYGEN Inhale 2 L into the lungs continuous. As needed     PE-diphenhydrAMINE-DM-GG-APAP (DELSYM DAY NIGHT PO) Take by mouth in the morning and at bedtime.     rosuvastatin (CRESTOR) 20 MG tablet Take 1 tablet (20 mg total) by mouth daily. 30 tablet 3   sildenafil (REVATIO) 20 MG tablet Take 20 mg by mouth daily as needed (ED).     Simethicone (SIMETHICONE ULTRA STRENGTH) 180 MG CAPS Take 1 capsule (180  mg total) by mouth 3 (three) times daily as needed. 90 capsule 0   sodium chloride (OCEAN) 0.65 % SOLN nasal spray Place 1 spray into both nostrils as needed for congestion.     tamsulosin (FLOMAX) 0.4 MG CAPS capsule Take 0.4 mg by mouth daily.     thyroid (ARMOUR) 120 MG tablet Take 120 mg by mouth daily before breakfast.     triamcinolone cream (KENALOG) 0.1 % Apply 1 application topically daily as needed (sun burn itch).     No current facility-administered medications for this visit.    REVIEW OF SYSTEMS:   Constitutional: ( - ) fevers, ( - )  chills , ( - ) night sweats Eyes: ( - ) blurriness of vision, ( - ) double vision, ( - ) watery eyes Ears, nose, mouth, throat, and face: ( - ) mucositis, ( - ) sore throat Respiratory: ( - ) cough, ( + ) dyspnea, ( - ) wheezes Cardiovascular: ( - ) palpitation, ( - ) chest discomfort, ( - ) lower extremity swelling Gastrointestinal:  ( - ) nausea, ( - ) heartburn, ( - ) change in bowel habits Skin: ( - ) abnormal skin rashes Lymphatics: ( - ) new lymphadenopathy, ( - ) easy bruising Neurological: ( - ) numbness, ( - ) tingling, ( - ) new weaknesses Behavioral/Psych: ( - ) mood change, ( - ) new changes  All other systems were reviewed with the patient and are negative.  PHYSICAL EXAMINATION: ECOG PERFORMANCE STATUS: 1 - Symptomatic but completely ambulatory  TELEPHONE VISIT, no physical exam  LABORATORY DATA:  I have reviewed the data as listed    Latest Ref Rng & Units 01/20/2023    8:59 AM 12/23/2022    8:58 AM 12/04/2022   11:31 AM  CBC  WBC 4.0 - 10.5 K/uL 6.9  6.3  5.6   Hemoglobin 13.0 - 17.0 g/dL 40.9  81.1  91.4   Hematocrit 39.0 - 52.0 % 41.8  40.2  40.0   Platelets 150 - 400 K/uL 215  218  283.0  Latest Ref Rng & Units 01/20/2023    8:59 AM 12/23/2022    8:58 AM 11/20/2022    2:34 PM  CMP  Glucose 70 - 99 mg/dL 92  76  84   BUN 8 - 23 mg/dL 18  20  22    Creatinine 0.61 - 1.24 mg/dL 7.82  9.56  2.13   Sodium 135 -  145 mmol/L 130  134  130   Potassium 3.5 - 5.1 mmol/L 4.3  4.2  4.5   Chloride 98 - 111 mmol/L 95  99  96   CO2 22 - 32 mmol/L 29  29  26    Calcium 8.9 - 10.3 mg/dL 9.8  9.8  8.7   Total Protein 6.5 - 8.1 g/dL 8.1  7.4  7.8   Total Bilirubin 0.3 - 1.2 mg/dL 0.4  0.3  0.5   Alkaline Phos 38 - 126 U/L 57  54  52   AST 15 - 41 U/L 26  26  32   ALT 0 - 44 U/L 18  19  28      Lab Results  Component Value Date   MPROTEIN 0.5 (H) 01/20/2023   MPROTEIN 0.4 (H) 12/23/2022   MPROTEIN 0.3 (H) 11/20/2022   Lab Results  Component Value Date   KPAFRELGTCHN 38.9 (H) 01/20/2023   KPAFRELGTCHN 36.5 (H) 12/23/2022   KPAFRELGTCHN 36.3 (H) 11/20/2022   LAMBDASER 17.3 01/20/2023   LAMBDASER 16.0 12/23/2022   LAMBDASER 15.1 11/20/2022   KAPLAMBRATIO 2.25 (H) 01/20/2023   KAPLAMBRATIO 2.28 (H) 12/23/2022   KAPLAMBRATIO 2.40 (H) 11/20/2022    RADIOGRAPHIC STUDIES: I have personally reviewed the radiological images as listed and agreed with the findings in the report: lytic lesions in the hip bones bilaterally.  No results found.   ASSESSMENT & PLAN Samuel [name] 67 y.o. male with medical history significant for IgG Kappa multiple myeloma who presents for a follow up visit.   After review of the labs, review of the records, and discussion with the patient the findings are most consistent with an IgG kappa multiple myeloma.  The patient has 2 lytic lesions within the pelvic bones (more noted in spine on CT scan) as well as 10% plasma cells within the bone marrow biopsy.  This combined with his serological findings confirm the diagnosis of multiple myeloma.  The initial treatment of choice for this patient's multiple myeloma was VRd. This will consist of bortezomib 1.5mg /m2 on days 1, 8, 15, dexamethasone 40mg  on days 1,8, and 15, and revlimid 25mg  PO daily days 1-14. Cycles will consist of 21 days. We will use this regimen to stabilize the patient's myeloma, then make a referral to a BMT center of  his choosing for consideration of a bone marrow transplant once he has been noted to have a good response (VGPR or better). Zometa was started after dental clearance was obtained. This will be continued x 2 years.   Unfortunately had a drug reaction to [name] and was admitted to the hospital from 06/15/2021 until 06/18/2021.  The patient underwent a B AL which clearly showed evidence of eosinophils.  This is consistent with a drug reaction most likely caused by the patient's [name].  Discussed the case with pulmonology who recommended that we not rechallenge for attempt other drugs in the same class such as pomalidomide.  Given the patient's excellent response to [facility] therapy might preference would be to continue that.  As such I would recommend we proceed with CyBorD chemotherapy.  Daratumumab-based  therapy could have been considered, but is often paired with an immunologic.  Additionally I would like to preserve those for additional lines of therapy if necessary.  Therefore we will proceed with CyBorD chemotherapy have the patient referred to transplant.  R-IPSS score: Stage 2. PFS of 42 months  # IgG Kappa Multiple Myeloma (t11;14, standard risk)  --diagnostic criteria was met with monoclonal plasma cells in the bone marrow and lytic lesions on the bone --recommend proceeding with VRd chemotherapy as noted above --patient is young and healthy enough for consideration of BMT, though he is borderline with his age. Will consider referral pending response to treatment.  -- Cycle 1 Day 1 started on 03/16/2021 --decreased dexamethasone to 20mg  PO weekly due to fluid overload. Also started lasix 20mg  PO (changed on 06/01/2021) --plan to HOLD revlimid moving forward after evidence of drug reaction in the lungs. --started CyBorD chemotherapy on 06/29/2021 --patient has reached a VGPR.  Plan: --Labs from last visit were reviewed.White blood cell count 6.9, hemoglobin 14.4, MCV 86.5, and platelets of  215. No cytopenias, renal dysfunction or hypercalcemia. MM panel showed M protein 0.5, SFLC stable.  --If M protein increases above 0.5 g/dL, recommend to resume therapy. Last level noted at 0.4.   --[facility] recommended Dara/[name]/dexamethasone which we agree is a reasonable regimen. Patient was aware of the risks and benefits regarding possible worsening of the lung function. --After detailed discussion with patient he would like to hold on treatment at this time due to his frail lung condition.  We will reassess his labs q 4 weeks and determine the steps moving forward.  If this continues upward trend in his lung function improves he would be amenable to starting treatment. --RTC in 12 weeks with interval 4 week with labs   #Fatigue: --Labs from today don't show clear etiology.  --Patient has chronic hyponatremia but doesn't explain recent symptoms. --Patient has chronic hypothyroidism and was told recently to increase dose. Patient would like a second opinion with endocrinologist. Sent referral to [facility] endocrinology.  Appointment has not yet been scheduled, will work to make this happen.  #Drug Reaction in the Lung --confirmed with increased eosinophils on BAL during admission for shortness of breath -- on pirfenidone and steroid taper managed by pulmonologist, Dr. Marchelle Gearing.  #Supportive Care -- chemotherapy education complete --zometa therapy started on 04/05/2021 (4 mg IV q 3 months), dose held today per patient's request. --ASA 81mg  PO daily for thromboprophylaxis on revlimid -- zofran 8mg  q8H PRN and compazine 10mg  PO q6H for nausea -- acyclovir 400mg  PO BID for VCZ prophylaxis -- no pain medication required at this time.   No orders of the defined types were placed in this encounter.  All questions were answered. The patient knows to call the clinic with any problems, questions or concerns.  I have spent a total of 25 minutes minutes of face-to-face and non-face-to-face time,  preparing to see the patient,performing a medically appropriate examination, counseling and educating the patient,  documenting clinical information in the electronic health record,  and care coordination.   Ulysees Barns, MD Department of Hematology/Oncology [facility] at [facility] Phone: [phone] Pager: [phone] Email: [name]    02/09/2023 1:55 PM

## 2023-02-10 ENCOUNTER — Inpatient Hospital Stay: Payer: Medicare Other

## 2023-02-10 ENCOUNTER — Inpatient Hospital Stay (HOSPITAL_BASED_OUTPATIENT_CLINIC_OR_DEPARTMENT_OTHER): Payer: Medicare Other | Admitting: Hematology and Oncology

## 2023-02-10 ENCOUNTER — Other Ambulatory Visit: Payer: Self-pay

## 2023-02-10 VITALS — BP 131/70 | HR 57 | Temp 97.3°F | Resp 20 | Wt 210.1 lb

## 2023-02-10 DIAGNOSIS — C9000 Multiple myeloma not having achieved remission: Secondary | ICD-10-CM

## 2023-02-14 ENCOUNTER — Ambulatory Visit (HOSPITAL_COMMUNITY)
Admission: RE | Admit: 2023-02-14 | Discharge: 2023-02-14 | Disposition: A | Discharge status: Home / Self Care | Payer: Medicare Other | Source: Ambulatory Visit | Attending: Hematology and Oncology | Admitting: Hematology and Oncology

## 2023-02-14 DIAGNOSIS — C9000 Multiple myeloma not having achieved remission: Secondary | ICD-10-CM | POA: Insufficient documentation

## 2023-02-17 ENCOUNTER — Inpatient Hospital Stay: Payer: Medicare Other

## 2023-02-17 ENCOUNTER — Telehealth: Payer: Self-pay | Admitting: Internal Medicine

## 2023-02-17 ENCOUNTER — Telehealth: Payer: Self-pay | Admitting: *Deleted

## 2023-02-17 ENCOUNTER — Inpatient Hospital Stay: Payer: Medicare Other | Admitting: Hematology and Oncology

## 2023-02-17 DIAGNOSIS — J849 Interstitial pulmonary disease, unspecified: Secondary | ICD-10-CM

## 2023-02-17 NOTE — Telephone Encounter (Signed)
Frotn desk   I fell il 01/20/23 and could not see him  Plan  - co pay from that to be waived - please reschedule him someitime in July/aug 2024 with spiro/dlco at that time      Latest Ref Rng & Units 01/17/2023    2:09 PM 09/26/2022   10:34 AM 08/01/2022    1:57 PM 06/06/2022    1:52 PM 12/10/2021    9:24 AM 10/16/2021    3:56 PM 06/15/2021   10:13 PM  PFT Results  FVC-Pre L 1.36  1.32  1.55  1.43  1.49  1.35  1.55   FVC-Predicted Pre % 29  28  33  30  32  29  33   FVC-Post L       1.45   FVC-Predicted Post %       31   Pre FEV1/FVC % % 90  85  94  88  94  92  83   Post FEV1/FCV % %       83   FEV1-Pre L 1.23  1.12  1.46  1.26  1.40  1.24  1.28   FEV1-Predicted Pre % 36  32  42  36  40  36  37   FEV1-Post L       1.20   DLCO uncorrected ml/min/mmHg 12.92  9.61  10.43  13.19  8.64  9.99  12.00   DLCO UNC% % 48  36  39  49  32  37  44   DLCO corrected ml/min/mmHg 13.27  9.96  10.58  13.77  9.20  10.04  12.52   DLCO COR %Predicted % 49  37  39  51  34  37  46   DLVA Predicted % 127  107  110  128  97  109  118   TLC L       4.18   TLC % Predicted %       59   RV % Predicted %       95

## 2023-02-17 NOTE — Telephone Encounter (Signed)
Received call from patient asking about results from his bone density test done on 02/14/23. Advised that the results are not available yet but likely will be available later in the week.Pt voiced understanding.

## 2023-02-18 NOTE — Telephone Encounter (Signed)
Triage- patient is scheduled for PFT and OV. Please place order for spiro/dlco

## 2023-02-19 NOTE — Telephone Encounter (Signed)
Order placed

## 2023-02-20 ENCOUNTER — Telehealth: Payer: Self-pay | Admitting: *Deleted

## 2023-02-20 NOTE — Telephone Encounter (Signed)
-----   Message from Briant Cedar, PA-C sent at 02/19/2023  5:18 PM EDT ----- Please review with patient. No new bone lesions.    ----- Message ----- From: Interface, Rad Results In Sent: 02/19/2023  11:13 AM EDT To: Jaci Standard, MD

## 2023-02-20 NOTE — Telephone Encounter (Signed)
[name] patient regarding recent  bone survey.  No answer but was able to leave vm message on identified phone. Advised that bone survey showed no new bone lesions. Advised to call back if he had any questions or concerns. To [phone]

## 2023-02-21 ENCOUNTER — Other Ambulatory Visit: Payer: Self-pay | Admitting: *Deleted

## 2023-02-21 DIAGNOSIS — C9000 Multiple myeloma not having achieved remission: Secondary | ICD-10-CM

## 2023-02-24 ENCOUNTER — Inpatient Hospital Stay: Payer: Medicare Other | Attending: Hematology and Oncology

## 2023-02-24 DIAGNOSIS — C9000 Multiple myeloma not having achieved remission: Secondary | ICD-10-CM

## 2023-02-24 LAB — CBC WITH DIFFERENTIAL (CANCER CENTER ONLY)
Abs Immature Granulocytes: 0.02 10*3/uL (ref 0.00–0.07)
Basophils Absolute: 0 10*3/uL (ref 0.0–0.1)
Basophils Relative: 0 %
Eosinophils Absolute: 0.3 10*3/uL (ref 0.0–0.5)
Eosinophils Relative: 4 %
HCT: 44.8 % (ref 39.0–52.0)
Hemoglobin: 15 g/dL (ref 13.0–17.0)
Immature Granulocytes: 0 %
Lymphocytes Relative: 19 %
Lymphs Abs: 1.3 10*3/uL (ref 0.7–4.0)
MCH: 29.6 pg (ref 26.0–34.0)
MCHC: 33.5 g/dL (ref 30.0–36.0)
MCV: 88.4 fL (ref 80.0–100.0)
Monocytes Absolute: 0.5 10*3/uL (ref 0.1–1.0)
Monocytes Relative: 8 %
Neutro Abs: 4.7 10*3/uL (ref 1.7–7.7)
Neutrophils Relative %: 69 %
Platelet Count: 269 10*3/uL (ref 150–400)
RBC: 5.07 MIL/uL (ref 4.22–5.81)
RDW: 13.1 % (ref 11.5–15.5)
WBC Count: 6.8 10*3/uL (ref 4.0–10.5)
nRBC: 0 % (ref 0.0–0.2)

## 2023-02-24 LAB — CMP (CANCER CENTER ONLY)
ALT: 18 U/L (ref 0–44)
AST: 27 U/L (ref 15–41)
Albumin: 4.5 g/dL (ref 3.5–5.0)
Alkaline Phosphatase: 59 U/L (ref 38–126)
Anion gap: 5 (ref 5–15)
BUN: 17 mg/dL (ref 8–23)
CO2: 30 mmol/L (ref 22–32)
Calcium: 9.7 mg/dL (ref 8.9–10.3)
Chloride: 95 mmol/L — ABNORMAL LOW (ref 98–111)
Creatinine: 0.9 mg/dL (ref 0.61–1.24)
GFR, Estimated: >60 mL/min (ref >60)
Glucose, Bld: 105 mg/dL — ABNORMAL HIGH (ref 70–99)
Potassium: 4.3 mmol/L (ref 3.5–5.1)
Sodium: 130 mmol/L — ABNORMAL LOW (ref 135–145)
Total Bilirubin: 0.4 mg/dL (ref 0.3–1.2)
Total Protein: 8.4 g/dL — ABNORMAL HIGH (ref 6.5–8.1)

## 2023-02-24 LAB — LACTATE DEHYDROGENASE: LDH: 252 U/L — ABNORMAL HIGH (ref 98–192)

## 2023-02-25 LAB — KAPPA/LAMBDA LIGHT CHAINS
Kappa free light chain: 40.9 mg/L — ABNORMAL HIGH (ref 3.3–19.4)
Kappa, lambda light chain ratio: 2.3 — ABNORMAL HIGH (ref 0.26–1.65)
Lambda free light chains: 17.8 mg/L (ref 5.7–26.3)

## 2023-02-28 LAB — MULTIPLE MYELOMA PANEL, SERUM
Albumin SerPl Elph-Mcnc: 3.9 g/dL (ref 2.9–4.4)
Albumin/Glob SerPl: 1 (ref 0.7–1.7)
Alpha 1: 0.3 g/dL (ref 0.0–0.4)
Alpha2 Glob SerPl Elph-Mcnc: 0.9 g/dL (ref 0.4–1.0)
B-Globulin SerPl Elph-Mcnc: 1 g/dL (ref 0.7–1.3)
Gamma Glob SerPl Elph-Mcnc: 1.9 g/dL — ABNORMAL HIGH (ref 0.4–1.8)
Globulin, Total: 4.1 g/dL — ABNORMAL HIGH (ref 2.2–3.9)
IgA: 226 mg/dL (ref 61–437)
IgG (Immunoglobin G), Serum: 1910 mg/dL — ABNORMAL HIGH (ref 603–1613)
IgM (Immunoglobulin M), Srm: 94 mg/dL (ref 20–172)
M Protein SerPl Elph-Mcnc: 0.6 g/dL — ABNORMAL HIGH
Total Protein ELP: 8 g/dL (ref 6.0–8.5)

## 2023-03-18 ENCOUNTER — Other Ambulatory Visit (HOSPITAL_COMMUNITY): Payer: Self-pay

## 2023-03-18 ENCOUNTER — Telehealth: Payer: Self-pay

## 2023-03-18 NOTE — Telephone Encounter (Signed)
Patient Advocate Encounter   Received notification from [facility] that prior authorization is required for [facility] Aerosphere 160-9-4.8MCG/ACT aerosol   Submitted: 03-18-2023 Key n/a   This form was not able to be done online, so it was filled out and faxed to plan.  Awaiting determination

## 2023-03-21 NOTE — Telephone Encounter (Signed)
Called pt's pharmacy to see if pt was able to get inhaler and was told that pt did pick Rx up. Nothing further needed.

## 2023-03-21 NOTE — Telephone Encounter (Signed)
Patient Advocate Encounter  Received a fax from [employer] regarding Prior Authorization for [employer] 160-9-4.8MCG/ACT aerosol.   Key: [id number]  Authorization is not needed as drug is covered

## 2023-03-24 ENCOUNTER — Inpatient Hospital Stay: Payer: Medicare Other | Attending: Hematology and Oncology

## 2023-03-24 ENCOUNTER — Other Ambulatory Visit: Payer: Self-pay | Admitting: Hematology and Oncology

## 2023-03-24 DIAGNOSIS — C9000 Multiple myeloma not having achieved remission: Secondary | ICD-10-CM | POA: Insufficient documentation

## 2023-03-24 LAB — CMP (CANCER CENTER ONLY)
ALT: 15 U/L (ref 0–44)
AST: 24 U/L (ref 15–41)
Albumin: 4.2 g/dL (ref 3.5–5.0)
Alkaline Phosphatase: 64 U/L (ref 38–126)
Anion gap: 6 (ref 5–15)
BUN: 17 mg/dL (ref 8–23)
CO2: 28 mmol/L (ref 22–32)
Calcium: 9.5 mg/dL (ref 8.9–10.3)
Chloride: 96 mmol/L — ABNORMAL LOW (ref 98–111)
Creatinine: 1 mg/dL (ref 0.61–1.24)
GFR, Estimated: >60 mL/min (ref >60)
Glucose, Bld: 109 mg/dL — ABNORMAL HIGH (ref 70–99)
Potassium: 4 mmol/L (ref 3.5–5.1)
Sodium: 130 mmol/L — ABNORMAL LOW (ref 135–145)
Total Bilirubin: 0.4 mg/dL (ref 0.3–1.2)
Total Protein: 8.2 g/dL — ABNORMAL HIGH (ref 6.5–8.1)

## 2023-03-24 LAB — CBC WITH DIFFERENTIAL (CANCER CENTER ONLY)
Abs Immature Granulocytes: 0.01 10*3/uL (ref 0.00–0.07)
Basophils Absolute: 0 10*3/uL (ref 0.0–0.1)
Basophils Relative: 0 %
Eosinophils Absolute: 0.1 10*3/uL (ref 0.0–0.5)
Eosinophils Relative: 2 %
HCT: 42.1 % (ref 39.0–52.0)
Hemoglobin: 14.2 g/dL (ref 13.0–17.0)
Immature Granulocytes: 0 %
Lymphocytes Relative: 19 %
Lymphs Abs: 1.1 10*3/uL (ref 0.7–4.0)
MCH: 29.2 pg (ref 26.0–34.0)
MCHC: 33.7 g/dL (ref 30.0–36.0)
MCV: 86.6 fL (ref 80.0–100.0)
Monocytes Absolute: 0.4 10*3/uL (ref 0.1–1.0)
Monocytes Relative: 6 %
Neutro Abs: 4.3 10*3/uL (ref 1.7–7.7)
Neutrophils Relative %: 73 %
Platelet Count: 230 10*3/uL (ref 150–400)
RBC: 4.86 MIL/uL (ref 4.22–5.81)
RDW: 12.6 % (ref 11.5–15.5)
WBC Count: 5.9 10*3/uL (ref 4.0–10.5)
nRBC: 0 % (ref 0.0–0.2)

## 2023-03-24 LAB — LACTATE DEHYDROGENASE: LDH: 258 U/L — ABNORMAL HIGH (ref 98–192)

## 2023-03-25 LAB — KAPPA/LAMBDA LIGHT CHAINS
Kappa free light chain: 35.9 mg/L — ABNORMAL HIGH (ref 3.3–19.4)
Kappa, lambda light chain ratio: 2.16 — ABNORMAL HIGH (ref 0.26–1.65)
Lambda free light chains: 16.6 mg/L (ref 5.7–26.3)

## 2023-03-31 LAB — MULTIPLE MYELOMA PANEL, SERUM
Albumin SerPl Elph-Mcnc: 3.4 g/dL (ref 2.9–4.4)
Albumin/Glob SerPl: 1 (ref 0.7–1.7)
Alpha 1: 0.3 g/dL (ref 0.0–0.4)
Alpha2 Glob SerPl Elph-Mcnc: 0.8 g/dL (ref 0.4–1.0)
B-Globulin SerPl Elph-Mcnc: 1 g/dL (ref 0.7–1.3)
Gamma Glob SerPl Elph-Mcnc: 1.6 g/dL (ref 0.4–1.8)
Globulin, Total: 3.7 g/dL (ref 2.2–3.9)
IgA: 228 mg/dL (ref 61–437)
IgG (Immunoglobin G), Serum: 1791 mg/dL — ABNORMAL HIGH (ref 603–1613)
IgM (Immunoglobulin M), Srm: 89 mg/dL (ref 20–172)
M Protein SerPl Elph-Mcnc: 0.5 g/dL — ABNORMAL HIGH
Total Protein ELP: 7.1 g/dL (ref 6.0–8.5)

## 2023-04-03 ENCOUNTER — Telehealth: Payer: Self-pay | Admitting: *Deleted

## 2023-04-03 NOTE — Telephone Encounter (Signed)
[name] patient regarding recent lab results. No answer but was able to leave vm message for pt to return call to [phone] at his earl;iest convenience 

## 2023-04-03 NOTE — Telephone Encounter (Signed)
-----   Message from Jaci Standard, MD sent at 04/03/2023  9:00 AM EDT ----- Please let Mr. Jimerson know that his M protein is stable at 0.5. We will plan to see him back in early July to discuss further, but it is reassuring his numbers are steady.   ----- Message ----- From: Leory Plowman, Lab In [place] Sent: 03/24/2023   9:20 AM EDT To: Jaci Standard, MD

## 2023-04-04 NOTE — Telephone Encounter (Signed)
Received call back from pt. Reviewed M.Protein results with pt. He is pleased with the stability of his Myeloma. No questions or concerns.

## 2023-04-20 ENCOUNTER — Other Ambulatory Visit: Payer: Self-pay | Admitting: Hematology and Oncology

## 2023-04-20 DIAGNOSIS — C9000 Multiple myeloma not having achieved remission: Secondary | ICD-10-CM

## 2023-04-20 NOTE — Progress Notes (Signed)
[facility] [phone]   [phone]  ONCOLOGY PROGRESS NOTE  Patient Care Team: Samuel Joe, MD as PCP - General (Family Medicine)  Hematological/Oncological History # IgG Kappa Multiple Myeloma 02/02/2021:  Presented to [facility] ED due to right sided flank tenderness with bruising. CT abdomen/pelvis: Multiple small lytic lesions in the thoracolumbar spine and bilateral pelvis -SPEP: IgG 2,082 (H), M Protein 1.8 (H). IFE shows IgG monoclonal protein with kappa light chain specificity.  -LDH 169, CBC normal, CMP normal except for sodium 131 (L), Chloride 96 (L).   02/14/2021: Establish care with Samuel Kaufmann PA-C 02/22/2021: bone marrow biopsy confirms the diagnosis of Multiple Myeloma with a monoclonal plasma cell population.  03/16/2021: Cycle 1 Day 1 of VRd chemotherapy  04/06/2021: Cycle 2 Day 1 of VRd chemotherapy  04/27/2021: Cycle 3 Day 1 of VRd chemotherapy  05/18/2021: Cycle 4 Day 1 of VRd chemotherapy  06/01/2021: drop dexamethasone to 20mg  PO weekly and start lasix due to shortness of breath.  06/15/2021: Desaturation to 87% on ambulation. HELD velcade today and sent to ED for evaluation.  06/22/2021: Findings consistent with drug reaction the lungs with eosinophils on BAL.  Given these findings we will definitely hold [name] and plan to avoid pomalidomide 06/29/2021: Cycle 1 Day 1 CyBorD chemotherapy 07/05/2021: HOLD chemotherapy given worsening lung function  Interval History:  Samuel [name] 67 y.o. male with medical history significant for IgG Kappa multiple myeloma who presents for a follow up visit. The patient's last visit was on 02/10/2023. He is accompanied by his wife for this visit.  [name] reports he was recently diagnosed with GERD by his ENT doctor.  It was noted that his larynx showed inflammation and he has been started on antacid medication.  He notes that he does feel like most of his shortness of breath is now up higher in his  throat and not so much down in the lungs.  He reports that he is not having as much in the way of cough or breathlessness.  He notes that he has had no major changes in his lung function.  He does subjectively feel better.  He notes he is still using oxygen when at home up to 2 to 3 L.  He is also using up to 10 L when he exercises.  He was recently on a course of antibiotics as prescribed by ENT and has completed the course.  Otherwise he is at his baseline level of health with no changes.  He denies fevers, chills, night sweats, nausea, vomiting, or diarrhea. He has no other complaints. Rest of the 10 point ROS is listed below.  The bulk of our discussion focused on the next steps moving forward.  His M protein is currently stable and he is willing to continue monitoring/observation at this time.  MEDICAL HISTORY:  Past Medical History:  Diagnosis Date   Asthma    GERD (gastroesophageal reflux disease)    High cholesterol    under control.    History of blood transfusion    Hypothyroidism    Interstitial lung disease (HCC)    Multiple myeloma (HCC)    Perennial allergic rhinitis    Pneumonia    Sciatic pain, right    Seasonal allergic rhinitis    Thyroid disease     SURGICAL HISTORY: Past Surgical History:  Procedure Laterality Date   BRONCHIAL BIOPSY  06/18/2021   Procedure: BRONCHIAL BIOPSIES;  Surgeon: Samuel Peer, MD;  Location: [facility];  Service: Cardiopulmonary;;   BRONCHIAL BIOPSY  08/28/2021   Procedure: BRONCHIAL BIOPSIES;  Surgeon: Samuel Igo, DO;  Location: [facility];  Service: Cardiopulmonary;;   BRONCHIAL WASHINGS  06/18/2021   Procedure: BRONCHIAL WASHINGS;  Surgeon: Samuel Peer, MD;  Location: [facility];  Service: Cardiopulmonary;;   BRONCHIAL WASHINGS  08/28/2021   Procedure: BRONCHIAL WASHINGS;  Surgeon: Samuel Igo, DO;  Location: [facility];  Service: Cardiopulmonary;;   NASAL SINUS SURGERY     NECK SURGERY  1996   VIDEO BRONCHOSCOPY  N/A 06/18/2021   Procedure: VIDEO BRONCHOSCOPY WITH FLUORO;  Surgeon: Samuel Peer, MD;  Location: [facility];  Service: Cardiopulmonary;  Laterality: N/A;   VIDEO BRONCHOSCOPY N/A 08/28/2021   Procedure: VIDEO BRONCHOSCOPY WITH FLUORO;  Surgeon: Samuel Igo, DO;  Location: [facility];  Service: Cardiopulmonary;  Laterality: N/A;    SOCIAL HISTORY: Social History   Socioeconomic History   Marital status: Married    Spouse name: Not on file   Number of children: 3   Years of education: Not on file   Highest education level: Not on file  Occupational History   Not on file  Tobacco Use   Smoking status: Former    Packs/day: 0.10    Years: 3.00    Additional pack years: 0.00    Total pack years: 0.30    Types: Cigarettes    Quit date: 10/21/1982    Years since quitting: 40.5   Smokeless tobacco: Never  Vaping Use   Vaping Use: Never used  Substance and Sexual Activity   Alcohol use: Not Currently    Comment: 1 drink daily   Drug use: No   Sexual activity: Not on file  Other Topics Concern   Not on file  Social History Narrative   Not on file   Social Determinants of Health   Financial Resource Strain: Not on file  Food Insecurity: Not on file  Transportation Needs: Not on file  Physical Activity: Not on file  Stress: Not on file  Social Connections: Not on file  Intimate Partner Violence: Not on file    FAMILY HISTORY: Family History  Problem Relation Age of Onset   Hypertension Mother    Allergies Mother    Allergies Father    CAD Father 65   Breast cancer Paternal Grandmother    Hypertension Other    Heart disease Other     ALLERGIES:  is allergic to clarithromycin, ofev [nintedanib], and penicillins.  MEDICATIONS:  Current Outpatient Medications  Medication Sig Dispense Refill   omeprazole (PRILOSEC) 40 MG capsule Take 1 capsule (40 mg total) by mouth 2 (two) times daily. Take 30 minutes before a meal 60 capsule 3   acyclovir (ZOVIRAX) 400 MG  tablet Take 1 tablet (400 mg total) by mouth 2 (two) times daily. 60 tablet 3   albuterol (PROVENTIL) (2.5 MG/3ML) 0.083% nebulizer solution Take 3 mLs (2.5 mg total) by nebulization every 6 (six) hours as needed for wheezing or shortness of breath. 120 mL 12   albuterol (VENTOLIN HFA) 108 (90 Base) MCG/ACT inhaler Inhale 2 puffs into the lungs every 6 hours as needed for wheezing or shortness of breath. 8.5 g 1   ALPRAZolam (XANAX) 0.5 MG tablet Take 0.5 mg by mouth at bedtime.     ASSESS FULL RANGE PEAK METER DEVI as directed.     azelastine (ASTELIN) 0.1 % nasal spray Place 2 sprays into both nostrils 2 (two) times daily. Use in each nostril  as directed 30 mL 5   bisoprolol (ZEBETA) 5 MG tablet Take 1 tablet (5 mg total) by mouth daily. 30 tablet 2   Budeson-Glycopyrrol-Formoterol (BREZTRI AEROSPHERE) 160-9-4.8 MCG/ACT AERO Inhale 2 puffs into the lungs in the morning and at bedtime. 10.7 g 5   cholecalciferol (VITAMIN D) 1000 UNITS tablet Take 3,000 Units by mouth daily.     Dextromethorphan HBr (DELSYM PO) Take by mouth as needed. For cough     dextromethorphan-guaiFENesin (MUCINEX DM) 30-600 MG 12hr tablet Take 1 tablet by mouth 2 (two) times daily.     EPINEPHrine (EPI-PEN) 0.3 mg/0.3 mL DEVI Inject 0.3 mg into the muscle as needed.     fluticasone (FLONASE) 50 MCG/ACT nasal spray 1 spray.     furosemide (LASIX) 20 MG tablet TAKE ONE TABLET BY MOUTH AS NEEDED FOR FLUID OR EDEMA 14 tablet 1   ketoconazole (NIZORAL) 2 % cream SMARTSIG:1 Application Topical 1 to 2 Times Daily     loratadine (CLARITIN) 10 MG tablet Take 10 mg by mouth daily.     montelukast (SINGULAIR) 10 MG tablet Take 10 mg by mouth at bedtime.     Multiple Vitamin (MULTIVITAMIN) tablet Take 1 tablet by mouth daily.     OXYGEN Inhale 2 L into the lungs continuous. As needed     PE-diphenhydrAMINE-DM-GG-APAP (DELSYM DAY NIGHT PO) Take by mouth in the morning and at bedtime.     rosuvastatin (CRESTOR) 20 MG tablet Take 1  tablet (20 mg total) by mouth daily. 30 tablet 3   sildenafil (REVATIO) 20 MG tablet Take 20 mg by mouth daily as needed (ED).     Simethicone (SIMETHICONE ULTRA STRENGTH) 180 MG CAPS Take 1 capsule (180 mg total) by mouth 3 (three) times daily as needed. 90 capsule 0   sodium chloride (OCEAN) 0.65 % SOLN nasal spray Place 1 spray into both nostrils as needed for congestion.     tamsulosin (FLOMAX) 0.4 MG CAPS capsule Take 0.4 mg by mouth daily.     thyroid (ARMOUR) 120 MG tablet Take 120 mg by mouth daily before breakfast.     triamcinolone cream (KENALOG) 0.1 % Apply 1 application topically daily as needed (sun burn itch).     No current facility-administered medications for this visit.    REVIEW OF SYSTEMS:   Constitutional: ( - ) fevers, ( - )  chills , ( - ) night sweats Eyes: ( - ) blurriness of vision, ( - ) double vision, ( - ) watery eyes Ears, nose, mouth, throat, and face: ( - ) mucositis, ( - ) sore throat Respiratory: ( - ) cough, ( + ) dyspnea, ( - ) wheezes Cardiovascular: ( - ) palpitation, ( - ) chest discomfort, ( - ) lower extremity swelling Gastrointestinal:  ( - ) nausea, ( - ) heartburn, ( - ) change in bowel habits Skin: ( - ) abnormal skin rashes Lymphatics: ( - ) new lymphadenopathy, ( - ) easy bruising Neurological: ( - ) numbness, ( - ) tingling, ( - ) new weaknesses Behavioral/Psych: ( - ) mood change, ( - ) new changes  All other systems were reviewed with the patient and are negative.  PHYSICAL EXAMINATION: ECOG PERFORMANCE STATUS: 1 - Symptomatic but completely ambulatory  GENERAL: well appearing middle-aged Caucasian male in NAD  SKIN: skin color, texture, turgor are normal, no rashes or significant lesions EYES: conjunctiva are pink and non-injected, sclera clear LUNGS: clear to auscultation and percussion with normal breathing effort HEART:  regular rate & rhythm and no murmurs and no lower extremity edema Musculoskeletal: no cyanosis of digits and no  clubbing  PSYCH: alert & oriented x 3, fluent speech NEURO: no focal motor/sensory deficits   LABORATORY DATA:  I have reviewed the data as listed    Latest Ref Rng & Units 04/21/2023    9:33 AM 03/24/2023    9:00 AM 02/24/2023    9:05 AM  CBC  WBC 4.0 - 10.5 K/uL 6.0  5.9  6.8   Hemoglobin 13.0 - 17.0 g/dL 16.1  09.6  04.5   Hematocrit 39.0 - 52.0 % 41.1  42.1  44.8   Platelets 150 - 400 K/uL 214  230  269        Latest Ref Rng & Units 04/21/2023    9:33 AM 03/24/2023    9:00 AM 02/24/2023    9:05 AM  CMP  Glucose 70 - 99 mg/dL 71  409  811   BUN 8 - 23 mg/dL 11  17  17    Creatinine 0.61 - 1.24 mg/dL 9.14  7.82  9.56   Sodium 135 - 145 mmol/L 130  130  130   Potassium 3.5 - 5.1 mmol/L 4.5  4.0  4.3   Chloride 98 - 111 mmol/L 96  96  95   CO2 22 - 32 mmol/L 30  28  30    Calcium 8.9 - 10.3 mg/dL 8.7  9.5  9.7   Total Protein 6.5 - 8.1 g/dL 7.6  8.2  8.4   Total Bilirubin 0.3 - 1.2 mg/dL 0.3  0.4  0.4   Alkaline Phos 38 - 126 U/L 68  64  59   AST 15 - 41 U/L 32  24  27   ALT 0 - 44 U/L 30  15  18      Lab Results  Component Value Date   MPROTEIN 0.5 (H) 03/24/2023   MPROTEIN 0.6 (H) 02/24/2023   MPROTEIN 0.5 (H) 01/20/2023   Lab Results  Component Value Date   KPAFRELGTCHN 35.9 (H) 03/24/2023   KPAFRELGTCHN 40.9 (H) 02/24/2023   KPAFRELGTCHN 38.9 (H) 01/20/2023   LAMBDASER 16.6 03/24/2023   LAMBDASER 17.8 02/24/2023   LAMBDASER 17.3 01/20/2023   KAPLAMBRATIO 2.16 (H) 03/24/2023   KAPLAMBRATIO 2.30 (H) 02/24/2023   KAPLAMBRATIO 2.25 (H) 01/20/2023    RADIOGRAPHIC STUDIES: I have personally reviewed the radiological images as listed and agreed with the findings in the report: lytic lesions in the hip bones bilaterally.  No results found.   ASSESSMENT & PLAN Samuel [name] 67 y.o. male with medical history significant for IgG Kappa multiple myeloma who presents for a follow up visit.   After review of the labs, review of the records, and discussion with the patient  the findings are most consistent with an IgG kappa multiple myeloma.  The patient has 2 lytic lesions within the pelvic bones (more noted in spine on CT scan) as well as 10% plasma cells within the bone marrow biopsy.  This combined with his serological findings confirm the diagnosis of multiple myeloma.  The initial treatment of choice for this patient's multiple myeloma was VRd. This will consist of bortezomib 1.5mg /m2 on days 1, 8, 15, dexamethasone 40mg  on days 1,8, and 15, and revlimid 25mg  PO daily days 1-14. Cycles will consist of 21 days. We will use this regimen to stabilize the patient's myeloma, then make a referral to a BMT center of his choosing for consideration of a bone marrow  transplant once he has been noted to have a good response (VGPR or better). Zometa was started after dental clearance was obtained. This will be continued x 2 years.   Unfortunately had a drug reaction to [name] and was admitted to the hospital from 06/15/2021 until 06/18/2021.  The patient underwent a B AL which clearly showed evidence of eosinophils.  This is consistent with a drug reaction most likely caused by the patient's [name].  Discussed the case with pulmonology who recommended that we not rechallenge for attempt other drugs in the same class such as pomalidomide.  Given the patient's excellent response to [facility] therapy might preference would be to continue that.  As such I would recommend we proceed with CyBorD chemotherapy.  Daratumumab-based therapy could have been considered, but is often paired with an immunologic.  Additionally I would like to preserve those for additional lines of therapy if necessary.  Therefore we will proceed with CyBorD chemotherapy have the patient referred to transplant.  R-IPSS score: Stage 2. PFS of 42 months  # IgG Kappa Multiple Myeloma (t11;14, standard risk)  --diagnostic criteria was met with monoclonal plasma cells in the bone marrow and lytic lesions on the  bone --recommend proceeding with VRd chemotherapy as noted above --patient is young and healthy enough for consideration of BMT, though he is borderline with his age. Will consider referral pending response to treatment.  -- Cycle 1 Day 1 started on 03/16/2021 --decreased dexamethasone to 20mg  PO weekly due to fluid overload. Also started lasix 20mg  PO (changed on 06/01/2021) --plan to HOLD revlimid moving forward after evidence of drug reaction in the lungs. --started CyBorD chemotherapy on 06/29/2021 --patient has reached a VGPR.  Plan: --Labs from last visit were reviewed.White blood cell count 6.0, hemoglobin 13.6, MCV 88.8, and platelets of 214. No cytopenias, renal dysfunction or hypercalcemia. MM panel showed M protein 0.5, SFLC stable.  --If M protein increases above 0.5 g/dL, recommend to resume therapy. Last level noted at 0.5.   --[facility] recommended Dara/[name]/dexamethasone which we agree is a reasonable regimen. Patient was aware of the risks and benefits regarding possible worsening of the lung function. --After detailed discussion with patient he would like to hold on treatment at this time due to his frail lung condition.  We will reassess his labs q 4 weeks and determine the steps moving forward.  If this continues upward trend in his lung function improves he would be amenable to starting treatment. --RTC in 12 weeks with interval 4 week with labs   #Fatigue: --Labs from today don't show clear etiology.  --Patient has chronic hyponatremia but doesn't explain recent symptoms. --Patient has chronic hypothyroidism and was told recently to increase dose. Patient would like a second opinion with endocrinologist. Sent referral to [facility] endocrinology.  Appointment has not yet been scheduled, will work to make this happen.  #Drug Reaction in the Lung --confirmed with increased eosinophils on BAL during admission for shortness of breath -- on pirfenidone and steroid taper managed by  pulmonologist, Dr. Marchelle Gearing.  #Supportive Care -- chemotherapy education complete --zometa therapy started on 04/05/2021 (4 mg IV q 3 months), dose held today per patient's request. --ASA 81mg  PO daily for thromboprophylaxis on revlimid -- zofran 8mg  q8H PRN and compazine 10mg  PO q6H for nausea -- acyclovir 400mg  PO BID for VCZ prophylaxis -- no pain medication required at this time.   No orders of the defined types were placed in this encounter.  All questions were answered. The patient  knows to call the clinic with any problems, questions or concerns.  I have spent a total of 30 minutes minutes of face-to-face and non-face-to-face time, preparing to see the patient,performing a medically appropriate examination, counseling and educating the patient,  documenting clinical information in the electronic health record,  and care coordination.   Ulysees Barns, MD Department of Hematology/Oncology [facility] at [facility] Phone: [phone] Pager: [phone] Email: [email or web address]  04/21/2023 4:09 PM

## 2023-04-21 ENCOUNTER — Inpatient Hospital Stay: Payer: Medicare Other | Attending: Hematology and Oncology | Admitting: Hematology and Oncology

## 2023-04-21 ENCOUNTER — Inpatient Hospital Stay: Payer: Medicare Other

## 2023-04-21 ENCOUNTER — Other Ambulatory Visit: Payer: Self-pay

## 2023-04-21 VITALS — BP 147/87 | HR 51 | Temp 97.0°F | Resp 16 | Wt 209.7 lb

## 2023-04-21 DIAGNOSIS — D472 Monoclonal gammopathy: Secondary | ICD-10-CM

## 2023-04-21 DIAGNOSIS — Z7961 Long term (current) use of immunomodulator: Secondary | ICD-10-CM | POA: Diagnosis not present

## 2023-04-21 DIAGNOSIS — Z7969 Long term (current) use of other immunomodulators and immunosuppressants: Secondary | ICD-10-CM | POA: Diagnosis not present

## 2023-04-21 DIAGNOSIS — C9000 Multiple myeloma not having achieved remission: Secondary | ICD-10-CM | POA: Insufficient documentation

## 2023-04-21 DIAGNOSIS — Z79624 Long term (current) use of inhibitors of nucleotide synthesis: Secondary | ICD-10-CM | POA: Diagnosis not present

## 2023-04-21 DIAGNOSIS — Z7952 Long term (current) use of systemic steroids: Secondary | ICD-10-CM | POA: Insufficient documentation

## 2023-04-21 DIAGNOSIS — Z79899 Other long term (current) drug therapy: Secondary | ICD-10-CM | POA: Diagnosis not present

## 2023-04-21 DIAGNOSIS — Z87891 Personal history of nicotine dependence: Secondary | ICD-10-CM | POA: Diagnosis not present

## 2023-04-21 DIAGNOSIS — K219 Gastro-esophageal reflux disease without esophagitis: Secondary | ICD-10-CM | POA: Insufficient documentation

## 2023-04-21 LAB — CBC WITH DIFFERENTIAL (CANCER CENTER ONLY)
Abs Immature Granulocytes: 0.01 10*3/uL (ref 0.00–0.07)
Basophils Absolute: 0 10*3/uL (ref 0.0–0.1)
Basophils Relative: 1 %
Eosinophils Absolute: 0.4 10*3/uL (ref 0.0–0.5)
Eosinophils Relative: 7 %
HCT: 41.1 % (ref 39.0–52.0)
Hemoglobin: 13.6 g/dL (ref 13.0–17.0)
Immature Granulocytes: 0 %
Lymphocytes Relative: 22 %
Lymphs Abs: 1.3 10*3/uL (ref 0.7–4.0)
MCH: 29.4 pg (ref 26.0–34.0)
MCHC: 33.1 g/dL (ref 30.0–36.0)
MCV: 88.8 fL (ref 80.0–100.0)
Monocytes Absolute: 0.5 10*3/uL (ref 0.1–1.0)
Monocytes Relative: 9 %
Neutro Abs: 3.7 10*3/uL (ref 1.7–7.7)
Neutrophils Relative %: 61 %
Platelet Count: 214 10*3/uL (ref 150–400)
RBC: 4.63 MIL/uL (ref 4.22–5.81)
RDW: 12.8 % (ref 11.5–15.5)
WBC Count: 6 10*3/uL (ref 4.0–10.5)
nRBC: 0 % (ref 0.0–0.2)

## 2023-04-21 LAB — CMP (CANCER CENTER ONLY)
ALT: 30 U/L (ref 0–44)
AST: 32 U/L (ref 15–41)
Albumin: 3.7 g/dL (ref 3.5–5.0)
Alkaline Phosphatase: 68 U/L (ref 38–126)
Anion gap: 4 — ABNORMAL LOW (ref 5–15)
BUN: 11 mg/dL (ref 8–23)
CO2: 30 mmol/L (ref 22–32)
Calcium: 8.7 mg/dL — ABNORMAL LOW (ref 8.9–10.3)
Chloride: 96 mmol/L — ABNORMAL LOW (ref 98–111)
Creatinine: 0.83 mg/dL (ref 0.61–1.24)
GFR, Estimated: >60 mL/min (ref >60)
Glucose, Bld: 71 mg/dL (ref 70–99)
Potassium: 4.5 mmol/L (ref 3.5–5.1)
Sodium: 130 mmol/L — ABNORMAL LOW (ref 135–145)
Total Bilirubin: 0.3 mg/dL (ref 0.3–1.2)
Total Protein: 7.6 g/dL (ref 6.5–8.1)

## 2023-04-21 LAB — LACTATE DEHYDROGENASE: LDH: 250 U/L — ABNORMAL HIGH (ref 98–192)

## 2023-04-21 MED ORDER — OMEPRAZOLE 40 MG PO CPDR: 40.0000 mg | DELAYED_RELEASE_CAPSULE | Freq: Two times a day (BID) | ORAL | 3 refills | Status: DC

## 2023-04-22 ENCOUNTER — Telehealth: Payer: Self-pay | Admitting: Hematology

## 2023-04-22 LAB — KAPPA/LAMBDA LIGHT CHAINS
Kappa free light chain: 42.7 mg/L — ABNORMAL HIGH (ref 3.3–19.4)
Kappa, lambda light chain ratio: 2.4 — ABNORMAL HIGH (ref 0.26–1.65)
Lambda free light chains: 17.8 mg/L (ref 5.7–26.3)

## 2023-04-26 LAB — MULTIPLE MYELOMA PANEL, SERUM
Albumin SerPl Elph-Mcnc: 3.5 g/dL (ref 2.9–4.4)
Albumin/Glob SerPl: 1.1 (ref 0.7–1.7)
Alpha 1: 0.3 g/dL (ref 0.0–0.4)
Alpha2 Glob SerPl Elph-Mcnc: 0.7 g/dL (ref 0.4–1.0)
B-Globulin SerPl Elph-Mcnc: 0.9 g/dL (ref 0.7–1.3)
Gamma Glob SerPl Elph-Mcnc: 1.6 g/dL (ref 0.4–1.8)
Globulin, Total: 3.5 g/dL (ref 2.2–3.9)
IgA: 211 mg/dL (ref 61–437)
IgG (Immunoglobin G), Serum: 1753 mg/dL — ABNORMAL HIGH (ref 603–1613)
IgM (Immunoglobulin M), Srm: 87 mg/dL (ref 20–172)
M Protein SerPl Elph-Mcnc: 0.5 g/dL — ABNORMAL HIGH
Total Protein ELP: 7 g/dL (ref 6.0–8.5)

## 2023-04-28 ENCOUNTER — Telehealth: Payer: Self-pay | Admitting: *Deleted

## 2023-04-28 NOTE — Telephone Encounter (Signed)
[name] patient regarding recent lab results.  Spoke with him. Advised that his M protein remains stable at 0.5  Pt states he saw this on MyChart and is pleased that it remains stable. No questions or concerns at this time.

## 2023-04-28 NOTE — Telephone Encounter (Signed)
-----   Message from Jaci Standard, MD sent at 04/27/2023  9:57 AM EDT ----- Please let Samuel [name] know that his M protein remains stable at 0.5 ----- Message ----- From: Interface, Lab In [place] Sent: 04/21/2023   9:46 AM EDT To: Jaci Standard, MD

## 2023-05-18 ENCOUNTER — Other Ambulatory Visit: Payer: Self-pay | Admitting: Hematology and Oncology

## 2023-05-18 DIAGNOSIS — C9000 Multiple myeloma not having achieved remission: Secondary | ICD-10-CM

## 2023-05-19 ENCOUNTER — Inpatient Hospital Stay: Payer: Medicare Other

## 2023-05-19 ENCOUNTER — Other Ambulatory Visit: Payer: Self-pay

## 2023-05-19 DIAGNOSIS — C9000 Multiple myeloma not having achieved remission: Secondary | ICD-10-CM

## 2023-05-19 LAB — CBC WITH DIFFERENTIAL (CANCER CENTER ONLY)
Abs Immature Granulocytes: 0.01 10*3/uL (ref 0.00–0.07)
Basophils Absolute: 0 10*3/uL (ref 0.0–0.1)
Basophils Relative: 1 %
Eosinophils Absolute: 0.2 10*3/uL (ref 0.0–0.5)
Eosinophils Relative: 2 %
HCT: 42.6 % (ref 39.0–52.0)
Hemoglobin: 14.9 g/dL (ref 13.0–17.0)
Immature Granulocytes: 0 %
Lymphocytes Relative: 18 %
Lymphs Abs: 1.4 10*3/uL (ref 0.7–4.0)
MCH: 30.2 pg (ref 26.0–34.0)
MCHC: 35 g/dL (ref 30.0–36.0)
MCV: 86.4 fL (ref 80.0–100.0)
Monocytes Absolute: 0.6 10*3/uL (ref 0.1–1.0)
Monocytes Relative: 8 %
Neutro Abs: 5.2 10*3/uL (ref 1.7–7.7)
Neutrophils Relative %: 71 %
Platelet Count: 264 10*3/uL (ref 150–400)
RBC: 4.93 MIL/uL (ref 4.22–5.81)
RDW: 13.1 % (ref 11.5–15.5)
WBC Count: 7.3 10*3/uL (ref 4.0–10.5)
nRBC: 0 % (ref 0.0–0.2)

## 2023-05-19 LAB — CMP (CANCER CENTER ONLY)
ALT: 21 U/L (ref 0–44)
AST: 29 U/L (ref 15–41)
Albumin: 4.3 g/dL (ref 3.5–5.0)
Alkaline Phosphatase: 79 U/L (ref 38–126)
Anion gap: 5 (ref 5–15)
BUN: 14 mg/dL (ref 8–23)
CO2: 29 mmol/L (ref 22–32)
Calcium: 9.7 mg/dL (ref 8.9–10.3)
Chloride: 94 mmol/L — ABNORMAL LOW (ref 98–111)
Creatinine: 0.86 mg/dL (ref 0.61–1.24)
GFR, Estimated: >60 mL/min (ref >60)
Glucose, Bld: 80 mg/dL (ref 70–99)
Potassium: 4.3 mmol/L (ref 3.5–5.1)
Sodium: 128 mmol/L — ABNORMAL LOW (ref 135–145)
Total Bilirubin: 0.2 mg/dL — ABNORMAL LOW (ref 0.3–1.2)
Total Protein: 8.1 g/dL (ref 6.5–8.1)

## 2023-05-19 LAB — LACTATE DEHYDROGENASE: LDH: 261 U/L — ABNORMAL HIGH (ref 98–192)

## 2023-05-22 ENCOUNTER — Ambulatory Visit: Payer: Medicare Other | Admitting: Internal Medicine

## 2023-05-25 NOTE — Therapy (Unsigned)
OUTPATIENT SPEECH LANGUAGE PATHOLOGY VOICE EVALUATION   Patient Name: Samuel [name] MRN: [id number] DOB:1956-09-16, 67 y.o., male Today's Date: 05/26/2023  PCP: Tally Joe, MD REFERRING PROVIDER: Tally Joe, MD  END OF SESSION:  End of Session - 05/26/23 1237     Visit Number 1    Number of Visits 17    Date for SLP Re-Evaluation 07/21/23    Authorization Type medicare/ BCBS    SLP Start Time 1022    SLP Stop Time  1105    SLP Time Calculation (min) 43 min    Activity Tolerance Patient tolerated treatment well             Past Medical History:  Diagnosis Date   Asthma    GERD (gastroesophageal reflux disease)    High cholesterol    under control.    History of blood transfusion    Hypothyroidism    Interstitial lung disease (HCC)    Multiple myeloma (HCC)    Perennial allergic rhinitis    Pneumonia    Sciatic pain, right    Seasonal allergic rhinitis    Thyroid disease    Past Surgical History:  Procedure Laterality Date   BRONCHIAL BIOPSY  06/18/2021   Procedure: BRONCHIAL BIOPSIES;  Surgeon: Leslye Peer, MD;  Location: [facility];  Service: Cardiopulmonary;;   BRONCHIAL BIOPSY  08/28/2021   Procedure: BRONCHIAL BIOPSIES;  Surgeon: Josephine Igo, DO;  Location: [facility];  Service: Cardiopulmonary;;   BRONCHIAL WASHINGS  06/18/2021   Procedure: BRONCHIAL WASHINGS;  Surgeon: Leslye Peer, MD;  Location: [facility];  Service: Cardiopulmonary;;   BRONCHIAL WASHINGS  08/28/2021   Procedure: BRONCHIAL WASHINGS;  Surgeon: Josephine Igo, DO;  Location: [facility];  Service: Cardiopulmonary;;   NASAL SINUS SURGERY     NECK SURGERY  1996   VIDEO BRONCHOSCOPY N/A 06/18/2021   Procedure: VIDEO BRONCHOSCOPY WITH FLUORO;  Surgeon: Leslye Peer, MD;  Location: [facility];  Service: Cardiopulmonary;  Laterality: N/A;   VIDEO BRONCHOSCOPY N/A 08/28/2021   Procedure: VIDEO BRONCHOSCOPY WITH FLUORO;  Surgeon: Josephine Igo, DO;  Location: [facility];  Service: Cardiopulmonary;  Laterality: N/A;   Patient Active Problem List   Diagnosis Date Noted   Coronary artery disease 11/28/2022   Mixed hyperlipidemia 11/28/2022   Recurrent sinusitis 11/04/2022   Acute bacterial rhinosinusitis 09/26/2022   Chronic respiratory failure with hypoxia (HCC) 08/01/2022   S/P bronchoscopy    ILD (interstitial lung disease) (HCC)    Preop cardiovascular exam 08/21/2021   Pneumonitis 07/20/2021   Anxiety 07/20/2021   Interstitial pulmonary disease (HCC) 07/20/2021   Abnormal CT of the chest 06/18/2021   Dyspnea 06/15/2021   Hypoxia 06/15/2021   Multiple myeloma (HCC) 03/05/2021   Multiple myeloma not having achieved remission (HCC) 03/05/2021   Bone lesion 02/14/2021   Monoclonal gammopathy 02/14/2021   High cholesterol    Allergic rhinitis 11/23/2020   Mild persistent asthma 11/23/2020   SOB (shortness of breath) 10/04/2011    Onset date: 05/15/23 (referral date)  REFERRING DIAG: R49.0 (ICD-10-CM) - Dysphonia  THERAPY DIAG: Other voice and resonance disorders  Rationale for Evaluation and Treatment: Rehabilitation  SUBJECTIVE:   SUBJECTIVE STATEMENT: "it's a bad day" re: voice  Pt accompanied by: significant other  PERTINENT HISTORY: "Samuel [name] is a 67 y.o. male who presents as a return patient, for follow-up of sinusitis and globus sensation. At time of last visit on 03/14/2023, nasal culture was obtained following thick mucoid drainage noted  in the right nasal cavity. This was abnormal, and demonstrated gram-positive bacilli. Patient was subsequently treated with a 2-week course of oral antibiotics, and presents today for posttreatment imaging. He has continued to use proton pump inhibitor twice daily, but continues to experience frequent throat clearing and globus sensation. He states he has been working on dietary and lifestyle modifications as previously instructed, and has been partially successful. He still has difficulty  with frequent throat clearing and needing to cough often. He also has not been able to obtain humidification for his oxygen, as the DME company stated they needed a prescription.  Last visit 03/14/2023: Samuel [name] is a 67 y.o. male who presents as a new consult, referred by [name], FNP , for evaluation and treatment of sinus issues, hoarseness, and throat symptoms. He is accompanied by his wife.  Patient had history of multiple myeloma, status post treatment with chemotherapy. He developed interstitial lung disease following chemotherapeutic treatment, and is currently followed closely by pulmonology. He also endorses history of significant environmental allergies, and reports that recent allergy testing showed positivity to "everything.". He was previously on immunotherapy, but this was held during his cancer treatment. He is considering starting back on the chemotherapy due to the significance of his symptoms. He is currently using Singulair, antihistamine and Patanase nasal spray on a daily basis. He also reports using nasal saline irrigations on a daily basis. He reports history of deviated nasal septum repair in 1983. He denies history of sinusitis requiring treatment with oral antibiotics in the last 12 months. His primary symptoms are nasal congestion and postnasal drainage. He denies facial pressure, pain, hyposmia, hypogeusia. He has not had any imaging of his head or sinuses. He does occasionally experience headaches. He uses oxygen on a nightly basis, and states that his oxygen is currently not humidified.  He reports symptoms of silent reflux, and states he is currently taking omeprazole on a daily basis. [name] is frequent throat clearing and globus sensation. He also reports occasional hoarseness."  PAIN:  Are you having pain? No  FALLS: Has patient fallen in last 6 months? No  LIVING ENVIRONMENT: Lives with: lives with their family Lives in:  House/apartment  PLOF:Level of assistance: Independent with ADLs, Independent with IADLs Employment: Full-time employment  PATIENT GOALS: "control my breath, control my speech, and relaxation"   OBJECTIVE:   DIAGNOSTIC FINDINGS: "Nasolaryngoscopy performed at last visit demonstrated moderate interarytenoid edema with otherwise normal upper airway exam. Counseled patient to continue twice daily omeprazole for next 2-3 months. We discussed dietary lifestyle modifications for treatment of reflux to include avoiding spicy and acidic foods which exacerbate reflux, avoiding late meals and snacking, avoiding caffeine and stimulants, avoiding carbonated and alcoholic beverages, adequate oral hydration, avoidance of throat clearing and sleeping with the head of bed elevated. Recommended follow-up with gastroenterology if symptoms do not improve after 3 months of twice daily PPI. Offered referral to speech-language pathology due to continued hoarseness and frequent throat clearing, patient was agreeable, order placed today."   COGNITION: Overall cognitive status: Within functional limits for tasks assessed  SOCIAL HISTORY: Occupation: Full time Agricultural engineer intake: optimal but varies - typically 3x16 oz of water Caffeine/alcohol intake:  None Daily voice use: moderate at work, minimal at home   PERCEPTUAL VOICE ASSESSMENT: Voice quality: hoarse, breathy, rough, low vocal intensity, and vocal fatigue Vocal abuse: habitual throat clearing, abnormal breathing pattern, and excessive voice use Resonance: normal Respiratory function: clavicular breathing and speaking on  residual capacity  OBJECTIVE VOICE ASSESSMENT: Maximum phonation time for sustained "ah": 71 (4-5 seconds max)  Conversational loudness average: 68 dB with intentional  Conversational loudness range: 65-70 dB S/z ratio: 1 but limited seconds (4 seconds) (Suggestive of dysfunction >1.0)  PATIENT REPORTED OUTCOME MEASURES  (PROM): Communication Participation Item Bank: 9 "quite a bit" for all situations  but "very much" for getting turn in fast moving conversation  TODAY'S TREATMENT:                                                                                                                                         05-26-23: Briefly discussed 2 sub-systems of voicing (respiratory and phonatory). Dicussed plan to further target diaphragmatic breathing, forward projection of voice, and reduction in throat clears to optimize vocal quality. Pt verbalized understanding and denied further questions at this time.    PATIENT EDUCATION: Education details: see above  Person educated: Patient and Spouse Education method: Medical illustrator Education comprehension: verbalized understanding, returned demonstration, and needs further education  HOME EXERCISE PROGRAM: Abdominal breathing, forward resonance  GOALS: Goals reviewed with patient? Yes  SHORT TERM GOALS: Target date: 06/23/2023  Pt will reduce throat clearing and coughing by 50% at STG date using trained techniques Baseline: > 9, 6 respectively Goal status: INITIAL  2.  Pt will employ abdominal breathing during structured tasks with 80% accuracy given occasional min A  Baseline:  Goal status: INITIAL  3.  Pt will employ abdominal breathing during structured conversation with 80% accuracy given occasional min A Baseline:  Goal status: INITIAL  4.  Pt will maintain clear vocal quality in 5-10 minute structured conversations x2 given occasional min A  Baseline:  Goal status: INITIAL    LONG TERM GOALS: Target date: 07/21/2023  Pt will reduce throat clearing and coughing by 75% at LTG date using trained techniques Baseline:  Goal status: INITIAL  2.  Pt will employ abdominal breathing during unstructured conversation with 80% accuracy given rare min A Baseline:  Goal status: INITIAL  3.  Pt will maintain clear vocal quality in 15+  minute unstructured conversations x2 given rare min A  Baseline:  Goal status: INITIAL  4.  Pt will report improved communication effectiveness via PROM by 2 pts at last ST session  Baseline: CPIB=9 Goal status: INITIAL  ASSESSMENT:  CLINICAL IMPRESSION: Patient is a 67 y.o. M who was seen today for dysphonia. PMHX significant for Multiple Myeloma, interstitial lung disease, and GERD. Following chemo treatment, pt noted changes in breath support impacting his voice. Conversational speech today c/b occasional hoarseness/roughness, intermittent pressed voice, and increased breathy quality as conversation progressed and breath support decreased  Stimulable for clear vocal quality with upright positioning, diaphragmatic breath support, and increased vocal intensity and projection. Presented with throat clearing x9 and coughing x6 although aware of recommendation to reduce throat clearing. Wife reports his rate of breathing  increases while talking and coughing gets worse in afternoon/evening. Currently working full time as Scientist, research (medical) and frequently uses voice at work. Pt would benefit from skilled ST intervention to train vocal hygiene protocol and voice techniques to optimize vocal quality and communication effectiveness.   OBJECTIVE IMPAIRMENTS: include voice disorder. These impairments are limiting patient from effectively communicating at home and in community. Factors affecting potential to achieve goals and functional outcome are co-morbidities. Patient will benefit from skilled SLP services to address above impairments and improve overall function.  REHAB POTENTIAL: Good  PLAN:  SLP FREQUENCY: 2x/week  SLP DURATION: 8 weeks  PLANNED INTERVENTIONS: Language facilitation, Environmental controls, Cueing hierachy, Internal/external aids, Functional tasks, Multimodal communication approach, SLP instruction and feedback, Compensatory strategies, Patient/family education, and  Re-evaluation    Gracy Racer, CCC-SLP 05/26/2023, 12:38 PM

## 2023-05-26 ENCOUNTER — Ambulatory Visit: Payer: Medicare Other | Attending: Family Medicine

## 2023-05-26 DIAGNOSIS — R498 Other voice and resonance disorders: Secondary | ICD-10-CM | POA: Insufficient documentation

## 2023-06-04 ENCOUNTER — Ambulatory Visit: Payer: Medicare Other

## 2023-06-04 DIAGNOSIS — R498 Other voice and resonance disorders: Secondary | ICD-10-CM | POA: Diagnosis not present

## 2023-06-04 NOTE — Patient Instructions (Addendum)
  ABDOMINAL BREATHING (5 mins, 5x/day) Shoulders down - this is a cue to relax Place your hand on your abdomen - this helps you focus on easy abdominal breath support - the best and most relaxed way to breathe Breathe in through your nose and fill your belly with air, watching your hand move outward Breathe out through your mouth and watch your belly move in. An audible "sh"  may help Think of your breathing like a wave (in and out along the shore); no holding your breath  Think of your belly as a balloon, when you fill with air (inhale), the balloon gets bigger. As the air goes out (exhale), the balloon deflates.  If you are having difficulty coordinating this, lay on your back with a plastic cup on your belly and repeat the above steps, watching you belly move up with inhalation and down with exhalations  Practice breathing in and out in front of a mirror, watching your belly Breathe in for a count of 5 and breathe out for a count of 5  Now as you breathe out, get a picture of relaxing in your mind Feel the constant in-out of your breathing with your belly Picture the tension in your throat and chest evaporate like steam, melting away and FEEL it do so Picture your throat opening up so wide that a grapefruit or softball could fit through your throat.  What to Do Instead of Clearing Your Throat Use these strategies instead of clearing your throat. 1. Swallow your saliva. Swallow as hard as you can.  2. Take a drink of water. 3. Suck on ice chips. 4. Use a silent cough. Whisper the word "huh" from your belly without making a sound and then swallow. This is like a cough but without using your voice. 5. Hum on an "M" and then swallow. 6. Use a light, gentle cough (like tapping your vocal folds together) and then swallow. 7. Silently count to 10 and then swallow

## 2023-06-04 NOTE — Therapy (Signed)
OUTPATIENT SPEECH LANGUAGE PATHOLOGY VOICE TREATMENT   Patient Name: Samuel [name] MRN: [id number] DOB:1956-01-30, 67 y.o., male Today's Date: 06/04/2023  PCP: Samuel Joe, MD REFERRING PROVIDER: Tally Joe, MD  END OF SESSION:  End of Session - 06/04/23 1358     Visit Number 2    Number of Visits 17    Date for SLP Re-Evaluation 07/21/23    Authorization Type medicare/ BC[employer]    SLP Start Time 1400    SLP Stop Time  1450    SLP Time Calculation (min) 50 min    Activity Tolerance Patient tolerated treatment well              Past Medical History:  Diagnosis Date   Asthma    GERD (gastroesophageal reflux disease)    High cholesterol    under control.    History of blood transfusion    Hypothyroidism    Interstitial lung disease (HCC)    Multiple myeloma (HCC)    Perennial allergic rhinitis    Pneumonia    Sciatic pain, right    Seasonal allergic rhinitis    Thyroid disease    Past Surgical History:  Procedure Laterality Date   BRONCHIAL BIOPSY  06/18/2021   Procedure: BRONCHIAL BIOPSIES;  Surgeon: Samuel Peer, MD;  Location: [facility];  Service: Cardiopulmonary;;   BRONCHIAL BIOPSY  08/28/2021   Procedure: BRONCHIAL BIOPSIES;  Surgeon: Samuel Igo, DO;  Location: [facility];  Service: Cardiopulmonary;;   BRONCHIAL WASHINGS  06/18/2021   Procedure: BRONCHIAL WASHINGS;  Surgeon: Samuel Peer, MD;  Location: [facility];  Service: Cardiopulmonary;;   BRONCHIAL WASHINGS  08/28/2021   Procedure: BRONCHIAL WASHINGS;  Surgeon: Samuel Igo, DO;  Location: [facility];  Service: Cardiopulmonary;;   NASAL SINUS SURGERY     NECK SURGERY  1996   VIDEO BRONCHOSCOPY N/A 06/18/2021   Procedure: VIDEO BRONCHOSCOPY WITH FLUORO;  Surgeon: Samuel Peer, MD;  Location: [facility];  Service: Cardiopulmonary;  Laterality: N/A;   VIDEO BRONCHOSCOPY N/A 08/28/2021   Procedure: VIDEO BRONCHOSCOPY WITH FLUORO;  Surgeon: Samuel Igo, DO;  Location: [facility];  Service: Cardiopulmonary;  Laterality: N/A;   Patient Active Problem List   Diagnosis Date Noted   Coronary artery disease 11/28/2022   Mixed hyperlipidemia 11/28/2022   Recurrent sinusitis 11/04/2022   Acute bacterial rhinosinusitis 09/26/2022   Chronic respiratory failure with hypoxia (HCC) 08/01/2022   S/P bronchoscopy    ILD (interstitial lung disease) (HCC)    Preop cardiovascular exam 08/21/2021   Pneumonitis 07/20/2021   Anxiety 07/20/2021   Interstitial pulmonary disease (HCC) 07/20/2021   Abnormal CT of the chest 06/18/2021   Dyspnea 06/15/2021   Hypoxia 06/15/2021   Multiple myeloma (HCC) 03/05/2021   Multiple myeloma not having achieved remission (HCC) 03/05/2021   Bone lesion 02/14/2021   Monoclonal gammopathy 02/14/2021   High cholesterol    Allergic rhinitis 11/23/2020   Mild persistent asthma 11/23/2020   SOB (shortness of breath) 10/04/2011    Onset date: 05/15/23 (referral date)  REFERRING DIAG: R49.0 (ICD-10-CM) - Dysphonia  THERAPY DIAG: Other voice and resonance disorders  Rationale for Evaluation and Treatment: Rehabilitation  SUBJECTIVE:   SUBJECTIVE STATEMENT: "I haven't coughed today until I came here" Pt accompanied by: significant other  PERTINENT HISTORY: "Samuel [name] is a 67 y.o. male who presents as a return patient, for follow-up of sinusitis and globus sensation. At time of last visit on 03/14/2023, nasal culture was obtained following thick mucoid  drainage noted in the right nasal cavity. This was abnormal, and demonstrated gram-positive bacilli. Patient was subsequently treated with a 2-week course of oral antibiotics, and presents today for posttreatment imaging. He has continued to use proton pump inhibitor twice daily, but continues to experience frequent throat clearing and globus sensation. He states he has been working on dietary and lifestyle modifications as previously instructed, and has been partially successful. He still  has difficulty with frequent throat clearing and needing to cough often. He also has not been able to obtain humidification for his oxygen, as the DME company stated they needed a prescription.  Last visit 03/14/2023: Samuel [name] is a 67 y.o. male who presents as a new consult, referred by Samuel Ingles, FNP , for evaluation and treatment of sinus issues, hoarseness, and throat symptoms. He is accompanied by his wife.  Patient had history of multiple myeloma, status post treatment with chemotherapy. He developed interstitial lung disease following chemotherapeutic treatment, and is currently followed closely by pulmonology. He also endorses history of significant environmental allergies, and reports that recent allergy testing showed positivity to "everything.". He was previously on immunotherapy, but this was held during his cancer treatment. He is considering starting back on the chemotherapy due to the significance of his symptoms. He is currently using Singulair, antihistamine and Patanase nasal spray on a daily basis. He also reports using nasal saline irrigations on a daily basis. He reports history of deviated nasal septum repair in 1983. He denies history of sinusitis requiring treatment with oral antibiotics in the last 12 months. His primary symptoms are nasal congestion and postnasal drainage. He denies facial pressure, pain, hyposmia, hypogeusia. He has not had any imaging of his head or sinuses. He does occasionally experience headaches. He uses oxygen on a nightly basis, and states that his oxygen is currently not humidified.  He reports symptoms of silent reflux, and states he is currently taking omeprazole on a daily basis. [name] is frequent throat clearing and globus sensation. He also reports occasional hoarseness."  PAIN:  Are you having pain? No  FALLS: Has patient fallen in last 6 months? No  LIVING ENVIRONMENT: Lives with: lives with their family Lives in:  House/apartment  PLOF:Level of assistance: Independent with ADLs, Independent with IADLs Employment: Full-time employment  PATIENT GOALS: "control my breath, control my speech, and relaxation"   OBJECTIVE:   DIAGNOSTIC FINDINGS: "Nasolaryngoscopy performed at last visit demonstrated moderate interarytenoid edema with otherwise normal upper airway exam. Counseled patient to continue twice daily omeprazole for next 2-3 months. We discussed dietary lifestyle modifications for treatment of reflux to include avoiding spicy and acidic foods which exacerbate reflux, avoiding late meals and snacking, avoiding caffeine and stimulants, avoiding carbonated and alcoholic beverages, adequate oral hydration, avoidance of throat clearing and sleeping with the head of bed elevated. Recommended follow-up with gastroenterology if symptoms do not improve after 3 months of twice daily PPI. Offered referral to speech-language pathology due to continued hoarseness and frequent throat clearing, patient was agreeable, order placed today."   COGNITION: Overall cognitive status: Within functional limits for tasks assessed  SOCIAL HISTORY: Occupation: Full time Agricultural engineer intake: optimal but varies - typically 3x16 oz of water Caffeine/alcohol intake:  None Daily voice use: moderate at work, minimal at home   PERCEPTUAL VOICE ASSESSMENT: Voice quality: hoarse, breathy, rough, low vocal intensity, and vocal fatigue Vocal abuse: habitual throat clearing, abnormal breathing pattern, and excessive voice use Resonance: normal Respiratory function: clavicular breathing and  speaking on residual capacity  OBJECTIVE VOICE ASSESSMENT: Maximum phonation time for sustained "ah": 71 (4-5 seconds max)  Conversational loudness average: 68 dB with intentional  Conversational loudness range: 65-70 dB S/z ratio: 1 but limited seconds (4 seconds) (Suggestive of dysfunction >1.0)  PATIENT REPORTED OUTCOME MEASURES  (PROM): Communication Participation Item Bank: 9 "quite a bit" for all situations  but "very much" for getting turn in fast moving conversation  TODAY'S TREATMENT:                                                                                                                                         06-04-23: Pt entered with cough which persisted throughout session. Based on reported s/sx and triggers, SLP suspects chronic cough/VCD. Provided education and handout re: VCD and throat clear alternatives. Trained patient in diaphragmatic breathing and sniff-sniff-blow technique to suppress cough, in which pt able to demo with usual fading to occasional min A. Guided patient in self-analysis of upper body tension given usual tension present in chest/neck. Tension resulted in breath hold during inhale/exhale cycle. Breathing pattern improved with use of trained techniques. Trialed SOVTE, which was impacted by impaired breath support. Will incorporate as able.   05-26-23: Briefly discussed 2 sub-systems of voicing (respiratory and phonatory). Dicussed plan to further target diaphragmatic breathing, forward projection of voice, and reduction in throat clears to optimize vocal quality. Pt verbalized understanding and denied further questions at this time.    PATIENT EDUCATION: Education details: see above  Person educated: Patient and Spouse Education method: Medical illustrator Education comprehension: verbalized understanding, returned demonstration, and needs further education  HOME EXERCISE PROGRAM: Abdominal breathing, forward resonance  GOALS: Goals reviewed with patient? Yes  SHORT TERM GOALS: Target date: 06/23/2023  Pt will reduce throat clearing and coughing by 50% at STG date using trained techniques Baseline: > 9, 6 respectively Goal status: IN PROGRESS  2.  Pt will employ abdominal breathing during structured tasks with 80% accuracy given occasional min A  Baseline:  Goal  status: IN PROGRESS  3.  Pt will employ abdominal breathing during structured conversation with 80% accuracy given occasional min A Baseline:  Goal status: IN PROGRESS  4.  Pt will maintain clear vocal quality in 5-10 minute structured conversations x2 given occasional min A  Baseline:  Goal status: IN PROGRESS    LONG TERM GOALS: Target date: 07/21/2023  Pt will reduce throat clearing and coughing by 75% at LTG date using trained techniques Baseline:  Goal status: IN PROGRESS  2.  Pt will employ abdominal breathing during unstructured conversation with 80% accuracy given rare min A Baseline:  Goal status: IN PROGRESS  3.  Pt will maintain clear vocal quality in 15+ minute unstructured conversations x2 given rare min A  Baseline:  Goal status: IN PROGRESS  4.  Pt will report improved communication effectiveness via PROM by 2 pts at last  ST session  Baseline: CPIB=9 Goal status: IN PROGRESS  ASSESSMENT:  CLINICAL IMPRESSION: Patient is a 67 y.o. M who was seen today for dysphonia. PMHX significant for Multiple Myeloma, interstitial lung disease, and GERD. Following chemo treatment, pt noted changes in breath support impacting his voice. Conversational speech today c/b occasional hoarseness/roughness, intermittent pressed voice, and increased breathy quality as conversation progressed and breath support decreased  Stimulable for clear vocal quality with upright positioning, diaphragmatic breath support, and increased vocal intensity and projection. Presented with throat clearing x9 and coughing x6 although aware of recommendation to reduce throat clearing. Wife reports his rate of breathing increases while talking and coughing gets worse in afternoon/evening. Currently working full time as Scientist, research (medical) and frequently uses voice at work. Pt would benefit from skilled ST intervention to train vocal hygiene protocol and voice techniques to optimize vocal quality and communication  effectiveness.   OBJECTIVE IMPAIRMENTS: include voice disorder. These impairments are limiting patient from effectively communicating at home and in community. Factors affecting potential to achieve goals and functional outcome are co-morbidities. Patient will benefit from skilled SLP services to address above impairments and improve overall function.  REHAB POTENTIAL: Good  PLAN:  SLP FREQUENCY: 2x/week  SLP DURATION: 8 weeks  PLANNED INTERVENTIONS: Language facilitation, Environmental controls, Cueing hierachy, Internal/external aids, Functional tasks, Multimodal communication approach, SLP instruction and feedback, Compensatory strategies, Patient/family education, and Re-evaluation    Gracy Racer, CCC-SLP 06/04/2023, 2:59 PM

## 2023-06-06 ENCOUNTER — Ambulatory Visit: Payer: Medicare Other | Admitting: Speech Pathology

## 2023-06-06 DIAGNOSIS — R498 Other voice and resonance disorders: Secondary | ICD-10-CM | POA: Diagnosis not present

## 2023-06-06 NOTE — Patient Instructions (Signed)
Reflux Raft   Try brain dumping to address stress related to managing daily tasks -- start day by writing down all tasks you need to complete. When you think of new things, add them to your list.   Self-disclosure -- let someone know if you need a minute to breath when you have that really strong urge

## 2023-06-06 NOTE — Therapy (Signed)
OUTPATIENT SPEECH LANGUAGE PATHOLOGY VOICE TREATMENT   Patient Name: Samuel [name] MRN: [id number] DOB:Feb 03, 1956, 67 y.o., male Today's Date: 06/06/2023  PCP: Tally Joe, MD REFERRING PROVIDER: Tally Joe, MD  END OF SESSION:  End of Session - 06/06/23 0933     Visit Number 3    Number of Visits 17    Date for SLP Re-Evaluation 07/21/23    Authorization Type medicare/ BC[employer]    SLP Start Time [phone]    SLP Stop Time  1015    SLP Time Calculation (min) 41 min    Activity Tolerance Patient tolerated treatment well              Past Medical History:  Diagnosis Date   Asthma    GERD (gastroesophageal reflux disease)    High cholesterol    under control.    History of blood transfusion    Hypothyroidism    Interstitial lung disease (HCC)    Multiple myeloma (HCC)    Perennial allergic rhinitis    Pneumonia    Sciatic pain, right    Seasonal allergic rhinitis    Thyroid disease    Past Surgical History:  Procedure Laterality Date   BRONCHIAL BIOPSY  06/18/2021   Procedure: BRONCHIAL BIOPSIES;  Surgeon: Leslye Peer, MD;  Location: [facility];  Service: Cardiopulmonary;;   BRONCHIAL BIOPSY  08/28/2021   Procedure: BRONCHIAL BIOPSIES;  Surgeon: Josephine Igo, DO;  Location: [facility];  Service: Cardiopulmonary;;   BRONCHIAL WASHINGS  06/18/2021   Procedure: BRONCHIAL WASHINGS;  Surgeon: Leslye Peer, MD;  Location: [facility];  Service: Cardiopulmonary;;   BRONCHIAL WASHINGS  08/28/2021   Procedure: BRONCHIAL WASHINGS;  Surgeon: Josephine Igo, DO;  Location: [facility];  Service: Cardiopulmonary;;   NASAL SINUS SURGERY     NECK SURGERY  1996   VIDEO BRONCHOSCOPY N/A 06/18/2021   Procedure: VIDEO BRONCHOSCOPY WITH FLUORO;  Surgeon: Leslye Peer, MD;  Location: [facility];  Service: Cardiopulmonary;  Laterality: N/A;   VIDEO BRONCHOSCOPY N/A 08/28/2021   Procedure: VIDEO BRONCHOSCOPY WITH FLUORO;  Surgeon: Josephine Igo, DO;  Location: [facility];  Service: Cardiopulmonary;  Laterality: N/A;   Patient Active Problem List   Diagnosis Date Noted   Coronary artery disease 11/28/2022   Mixed hyperlipidemia 11/28/2022   Recurrent sinusitis 11/04/2022   Acute bacterial rhinosinusitis 09/26/2022   Chronic respiratory failure with hypoxia (HCC) 08/01/2022   S/P bronchoscopy    ILD (interstitial lung disease) (HCC)    Preop cardiovascular exam 08/21/2021   Pneumonitis 07/20/2021   Anxiety 07/20/2021   Interstitial pulmonary disease (HCC) 07/20/2021   Abnormal CT of the chest 06/18/2021   Dyspnea 06/15/2021   Hypoxia 06/15/2021   Multiple myeloma (HCC) 03/05/2021   Multiple myeloma not having achieved remission (HCC) 03/05/2021   Bone lesion 02/14/2021   Monoclonal gammopathy 02/14/2021   High cholesterol    Allergic rhinitis 11/23/2020   Mild persistent asthma 11/23/2020   SOB (shortness of breath) 10/04/2011    Onset date: 05/15/23 (referral date)  REFERRING DIAG: R49.0 (ICD-10-CM) - Dysphonia  THERAPY DIAG: Other voice and resonance disorders  Rationale for Evaluation and Treatment: Rehabilitation  SUBJECTIVE:   SUBJECTIVE STATEMENT: Pt reports increased awareness of sensation to throat clear and cough, has been implementing strategies previously addressed with increasing success.   PERTINENT HISTORY: "Samuel [name] is a 67 y.o. male who presents as a return patient, for follow-up of sinusitis and globus sensation. At time of last visit on  03/14/2023, nasal culture was obtained following thick mucoid drainage noted in the right nasal cavity. This was abnormal, and demonstrated gram-positive bacilli. Patient was subsequently treated with a 2-week course of oral antibiotics, and presents today for posttreatment imaging. He has continued to use proton pump inhibitor twice daily, but continues to experience frequent throat clearing and globus sensation. He states he has been working on dietary and lifestyle modifications  as previously instructed, and has been partially successful. He still has difficulty with frequent throat clearing and needing to cough often. He also has not been able to obtain humidification for his oxygen, as the DME company stated they needed a prescription.  Last visit 03/14/2023: Samuel [name] is a 67 y.o. male who presents as a new consult, referred by [name], FNP , for evaluation and treatment of sinus issues, hoarseness, and throat symptoms. He is accompanied by his wife.  Patient had history of multiple myeloma, status post treatment with chemotherapy. He developed interstitial lung disease following chemotherapeutic treatment, and is currently followed closely by pulmonology. He also endorses history of significant environmental allergies, and reports that recent allergy testing showed positivity to "everything.". He was previously on immunotherapy, but this was held during his cancer treatment. He is considering starting back on the chemotherapy due to the significance of his symptoms. He is currently using Singulair, antihistamine and Patanase nasal spray on a daily basis. He also reports using nasal saline irrigations on a daily basis. He reports history of deviated nasal septum repair in 1983. He denies history of sinusitis requiring treatment with oral antibiotics in the last 12 months. His primary symptoms are nasal congestion and postnasal drainage. He denies facial pressure, pain, hyposmia, hypogeusia. He has not had any imaging of his head or sinuses. He does occasionally experience headaches. He uses oxygen on a nightly basis, and states that his oxygen is currently not humidified.  He reports symptoms of silent reflux, and states he is currently taking omeprazole on a daily basis. [name] is frequent throat clearing and globus sensation. He also reports occasional hoarseness."  PAIN:  Are you having pain? No  FALLS: Has patient fallen in last 6 months? No  LIVING  ENVIRONMENT: Lives with: lives with their family Lives in: House/apartment  PLOF:Level of assistance: Independent with ADLs, Independent with IADLs Employment: Full-time employment  PATIENT GOALS: "control my breath, control my speech, and relaxation"   OBJECTIVE:   DIAGNOSTIC FINDINGS: "Nasolaryngoscopy performed at last visit demonstrated moderate interarytenoid edema with otherwise normal upper airway exam. Counseled patient to continue twice daily omeprazole for next 2-3 months. We discussed dietary lifestyle modifications for treatment of reflux to include avoiding spicy and acidic foods which exacerbate reflux, avoiding late meals and snacking, avoiding caffeine and stimulants, avoiding carbonated and alcoholic beverages, adequate oral hydration, avoidance of throat clearing and sleeping with the head of bed elevated. Recommended follow-up with gastroenterology if symptoms do not improve after 3 months of twice daily PPI. Offered referral to speech-language pathology due to continued hoarseness and frequent throat clearing, patient was agreeable, order placed today."   COGNITION: Overall cognitive status: Within functional limits for tasks assessed  SOCIAL HISTORY: Occupation: Full time Agricultural engineer intake: optimal but varies - typically 3x16 oz of water Caffeine/alcohol intake:  None Daily voice use: moderate at work, minimal at home   PERCEPTUAL VOICE ASSESSMENT: Voice quality: hoarse, breathy, rough, low vocal intensity, and vocal fatigue Vocal abuse: habitual throat clearing, abnormal breathing pattern, and excessive voice  use Resonance: normal Respiratory function: clavicular breathing and speaking on residual capacity  OBJECTIVE VOICE ASSESSMENT: Maximum phonation time for sustained "ah": 71 (4-5 seconds max)  Conversational loudness average: 68 dB with intentional  Conversational loudness range: 65-70 dB S/z ratio: 1 but limited seconds (4 seconds)  (Suggestive of dysfunction >1.0)  PATIENT REPORTED OUTCOME MEASURES (PROM): Communication Participation Item Bank: 9 "quite a bit" for all situations  but "very much" for getting turn in fast moving conversation  TODAY'S TREATMENT:                                                                                                                                         06/06/23: Reviewed cyclical nature of VCD/chronic throat clearing or coughing. SLP answered pt's questions regarding role of reflux. Pt able to teach back strategies to avoid chronic throat clearing: relaxing shoulders, neck, chest, sniff and blow, hard swallow, water sips. Addressed use of strategies during conversational speech, as pt reporting can be challenging to engage in work meeting/appointments when occurring. Pt demonstrating x1 coughing episode, lasting ~20 seconds, responsive to SLP cues to use water and hard swallow. X6 throat clears, which was minimal compared to instances in which pt demonstrated mod-I employment of alternative. Generated strategy to further address stress in regards to pt perceived trigger for increasing upper body tension.   06-04-23: Pt entered with cough which persisted throughout session. Based on reported s/sx and triggers, SLP suspects chronic cough/VCD. Provided education and handout re: VCD and throat clear alternatives. Trained patient in diaphragmatic breathing and sniff-sniff-blow technique to suppress cough, in which pt able to demo with usual fading to occasional min A. Guided patient in self-analysis of upper body tension given usual tension present in chest/neck. Tension resulted in breath hold during inhale/exhale cycle. Breathing pattern improved with use of trained techniques. Trialed SOVTE, which was impacted by impaired breath support. Will incorporate as able.   05-26-23: Briefly discussed 2 sub-systems of voicing (respiratory and phonatory). Dicussed plan to further target diaphragmatic  breathing, forward projection of voice, and reduction in throat clears to optimize vocal quality. Pt verbalized understanding and denied further questions at this time.    PATIENT EDUCATION: Education details: see above  Person educated: Patient and Spouse Education method: Medical illustrator Education comprehension: verbalized understanding, returned demonstration, and needs further education  HOME EXERCISE PROGRAM: Abdominal breathing, forward resonance  GOALS: Goals reviewed with patient? Yes  SHORT TERM GOALS: Target date: 06/23/2023  Pt will reduce throat clearing and coughing by 50% at STG date using trained techniques Baseline: > 9, 6 respectively Goal status: IN PROGRESS  2.  Pt will employ abdominal breathing during structured tasks with 80% accuracy given occasional min A  Baseline:  Goal status: IN PROGRESS  3.  Pt will employ abdominal breathing during structured conversation with 80% accuracy given occasional min A Baseline:  Goal status: IN PROGRESS  4.  Pt will maintain clear vocal quality in 5-10 minute structured conversations x2 given occasional min A  Baseline:  Goal status: IN PROGRESS    LONG TERM GOALS: Target date: 07/21/2023  Pt will reduce throat clearing and coughing by 75% at LTG date using trained techniques Baseline:  Goal status: IN PROGRESS  2.  Pt will employ abdominal breathing during unstructured conversation with 80% accuracy given rare min A Baseline:  Goal status: IN PROGRESS  3.  Pt will maintain clear vocal quality in 15+ minute unstructured conversations x2 given rare min A  Baseline:  Goal status: IN PROGRESS  4.  Pt will report improved communication effectiveness via PROM by 2 pts at last ST session  Baseline: CPIB=9 Goal status: IN PROGRESS  ASSESSMENT:  CLINICAL IMPRESSION: Patient is a 67 y.o. M who was seen today for dysphonia. PMHX significant for Multiple Myeloma, interstitial lung disease, and GERD.  Following chemo treatment, pt noted changes in breath support impacting his voice. Conversational speech today c/b occasional hoarseness/roughness, intermittent pressed voice, and increased breathy quality as conversation progressed and breath support decreased  Stimulable for clear vocal quality with upright positioning, diaphragmatic breath support, and increased vocal intensity and projection. Presented with throat clearing x9 and coughing x6 although aware of recommendation to reduce throat clearing. Wife reports his rate of breathing increases while talking and coughing gets worse in afternoon/evening. Currently working full time as Scientist, research (medical) and frequently uses voice at work. Pt would benefit from skilled ST intervention to train vocal hygiene protocol and voice techniques to optimize vocal quality and communication effectiveness.   OBJECTIVE IMPAIRMENTS: include voice disorder. These impairments are limiting patient from effectively communicating at home and in community. Factors affecting potential to achieve goals and functional outcome are co-morbidities. Patient will benefit from skilled SLP services to address above impairments and improve overall function.  REHAB POTENTIAL: Good  PLAN:  SLP FREQUENCY: 2x/week  SLP DURATION: 8 weeks  PLANNED INTERVENTIONS: Language facilitation, Environmental controls, Cueing hierachy, Internal/external aids, Functional tasks, Multimodal communication approach, SLP instruction and feedback, Compensatory strategies, Patient/family education, and Re-evaluation    Maia Breslow, CCC-SLP 06/06/2023, 9:34 AM

## 2023-06-09 ENCOUNTER — Ambulatory Visit: Payer: Medicare Other

## 2023-06-09 DIAGNOSIS — R498 Other voice and resonance disorders: Secondary | ICD-10-CM | POA: Diagnosis not present

## 2023-06-09 NOTE — Therapy (Signed)
OUTPATIENT SPEECH LANGUAGE PATHOLOGY VOICE TREATMENT   Patient Name: Samuel [name] MRN: [id number] DOB:11-02-1955, 67 y.o., male Today's Date: 06/09/2023  PCP: Tally Joe, MD REFERRING PROVIDER: Tally Joe, MD  END OF SESSION:  End of Session - 06/09/23 1328     Visit Number 4    Number of Visits 17    Date for SLP Re-Evaluation 07/21/23    Authorization Type medicare/ [employer]    SLP Start Time 1328   arrived late   SLP Stop Time  1400    SLP Time Calculation (min) 32 min    Activity Tolerance Patient tolerated treatment well               Past Medical History:  Diagnosis Date   Asthma    GERD (gastroesophageal reflux disease)    High cholesterol    under control.    History of blood transfusion    Hypothyroidism    Interstitial lung disease (HCC)    Multiple myeloma (HCC)    Perennial allergic rhinitis    Pneumonia    Sciatic pain, right    Seasonal allergic rhinitis    Thyroid disease    Past Surgical History:  Procedure Laterality Date   BRONCHIAL BIOPSY  06/18/2021   Procedure: BRONCHIAL BIOPSIES;  Surgeon: Leslye Peer, MD;  Location: [facility];  Service: Cardiopulmonary;;   BRONCHIAL BIOPSY  08/28/2021   Procedure: BRONCHIAL BIOPSIES;  Surgeon: Josephine Igo, DO;  Location: [facility];  Service: Cardiopulmonary;;   BRONCHIAL WASHINGS  06/18/2021   Procedure: BRONCHIAL WASHINGS;  Surgeon: Leslye Peer, MD;  Location: [facility];  Service: Cardiopulmonary;;   BRONCHIAL WASHINGS  08/28/2021   Procedure: BRONCHIAL WASHINGS;  Surgeon: Josephine Igo, DO;  Location: [facility];  Service: Cardiopulmonary;;   NASAL SINUS SURGERY     NECK SURGERY  1996   VIDEO BRONCHOSCOPY N/A 06/18/2021   Procedure: VIDEO BRONCHOSCOPY WITH FLUORO;  Surgeon: Leslye Peer, MD;  Location: [facility];  Service: Cardiopulmonary;  Laterality: N/A;   VIDEO BRONCHOSCOPY N/A 08/28/2021   Procedure: VIDEO BRONCHOSCOPY WITH FLUORO;  Surgeon: Josephine Igo,  DO;  Location: [facility];  Service: Cardiopulmonary;  Laterality: N/A;   Patient Active Problem List   Diagnosis Date Noted   Coronary artery disease 11/28/2022   Mixed hyperlipidemia 11/28/2022   Recurrent sinusitis 11/04/2022   Acute bacterial rhinosinusitis 09/26/2022   Chronic respiratory failure with hypoxia (HCC) 08/01/2022   S/P bronchoscopy    ILD (interstitial lung disease) (HCC)    Preop cardiovascular exam 08/21/2021   Pneumonitis 07/20/2021   Anxiety 07/20/2021   Interstitial pulmonary disease (HCC) 07/20/2021   Abnormal CT of the chest 06/18/2021   Dyspnea 06/15/2021   Hypoxia 06/15/2021   Multiple myeloma (HCC) 03/05/2021   Multiple myeloma not having achieved remission (HCC) 03/05/2021   Bone lesion 02/14/2021   Monoclonal gammopathy 02/14/2021   High cholesterol    Allergic rhinitis 11/23/2020   Mild persistent asthma 11/23/2020   SOB (shortness of breath) 10/04/2011    Onset date: 05/15/23 (referral date)  REFERRING DIAG: R49.0 (ICD-10-CM) - Dysphonia  THERAPY DIAG: Other voice and resonance disorders  Rationale for Evaluation and Treatment: Rehabilitation  SUBJECTIVE:   SUBJECTIVE STATEMENT: "there was a big improvement"  PERTINENT HISTORY: "Samuel [name] is a 67 y.o. male who presents as a return patient, for follow-up of sinusitis and globus sensation. At time of last visit on 03/14/2023, nasal culture was obtained following thick mucoid drainage noted in the  right nasal cavity. This was abnormal, and demonstrated gram-positive bacilli. Patient was subsequently treated with a 2-week course of oral antibiotics, and presents today for posttreatment imaging. He has continued to use proton pump inhibitor twice daily, but continues to experience frequent throat clearing and globus sensation. He states he has been working on dietary and lifestyle modifications as previously instructed, and has been partially successful. He still has difficulty with frequent  throat clearing and needing to cough often. He also has not been able to obtain humidification for his oxygen, as the DME company stated they needed a prescription.  Last visit 03/14/2023: Samuel [name] is a 67 y.o. male who presents as a new consult, referred by [name], FNP , for evaluation and treatment of sinus issues, hoarseness, and throat symptoms. He is accompanied by his wife.  Patient had history of multiple myeloma, status post treatment with chemotherapy. He developed interstitial lung disease following chemotherapeutic treatment, and is currently followed closely by pulmonology. He also endorses history of significant environmental allergies, and reports that recent allergy testing showed positivity to "everything.". He was previously on immunotherapy, but this was held during his cancer treatment. He is considering starting back on the chemotherapy due to the significance of his symptoms. He is currently using Singulair, antihistamine and Patanase nasal spray on a daily basis. He also reports using nasal saline irrigations on a daily basis. He reports history of deviated nasal septum repair in 1983. He denies history of sinusitis requiring treatment with oral antibiotics in the last 12 months. His primary symptoms are nasal congestion and postnasal drainage. He denies facial pressure, pain, hyposmia, hypogeusia. He has not had any imaging of his head or sinuses. He does occasionally experience headaches. He uses oxygen on a nightly basis, and states that his oxygen is currently not humidified.  He reports symptoms of silent reflux, and states he is currently taking omeprazole on a daily basis. [name] is frequent throat clearing and globus sensation. He also reports occasional hoarseness."  PAIN:  Are you having pain? No  FALLS: Has patient fallen in last 6 months? No  LIVING ENVIRONMENT: Lives with: lives with their family Lives in: House/apartment  PLOF:Level of  assistance: Independent with ADLs, Independent with IADLs Employment: Full-time employment  PATIENT GOALS: "control my breath, control my speech, and relaxation"   OBJECTIVE:   DIAGNOSTIC FINDINGS: "Nasolaryngoscopy performed at last visit demonstrated moderate interarytenoid edema with otherwise normal upper airway exam. Counseled patient to continue twice daily omeprazole for next 2-3 months. We discussed dietary lifestyle modifications for treatment of reflux to include avoiding spicy and acidic foods which exacerbate reflux, avoiding late meals and snacking, avoiding caffeine and stimulants, avoiding carbonated and alcoholic beverages, adequate oral hydration, avoidance of throat clearing and sleeping with the head of bed elevated. Recommended follow-up with gastroenterology if symptoms do not improve after 3 months of twice daily PPI. Offered referral to speech-language pathology due to continued hoarseness and frequent throat clearing, patient was agreeable, order placed today."   COGNITION: Overall cognitive status: Within functional limits for tasks assessed  SOCIAL HISTORY: Occupation: Full time Agricultural engineer intake: optimal but varies - typically 3x16 oz of water Caffeine/alcohol intake:  None Daily voice use: moderate at work, minimal at home   PERCEPTUAL VOICE ASSESSMENT: Voice quality: hoarse, breathy, rough, low vocal intensity, and vocal fatigue Vocal abuse: habitual throat clearing, abnormal breathing pattern, and excessive voice use Resonance: normal Respiratory function: clavicular breathing and speaking on residual capacity  OBJECTIVE VOICE ASSESSMENT: Maximum phonation time for sustained "ah": 71 (4-5 seconds max)  Conversational loudness average: 68 dB with intentional  Conversational loudness range: 65-70 dB S/z ratio: 1 but limited seconds (4 seconds) (Suggestive of dysfunction >1.0)  PATIENT REPORTED OUTCOME MEASURES (PROM): Communication  Participation Item Bank: 9 "quite a bit" for all situations  but "very much" for getting turn in fast moving conversation  TODAY'S TREATMENT:                                                                                                                                         06/09/23: Reported good carryover of trained techniques to suppress cough/throat clearing with benefit endorsed. Pt able to independently reduce/suppress cough/throat clear with ~60% accuracy this session. Occasional min A otherwise needed, mostly to relax upper body. Trialed SOVT and resonant exercises, which were limited d/t breath support and cyclical nature of VCD. Targeted "clear" voice with cue to speak out, with benefit achieved at phrase level. Plan to address next session.   06/06/23: Reviewed cyclical nature of VCD/chronic throat clearing or coughing. SLP answered pt's questions regarding role of reflux. Pt able to teach back strategies to avoid chronic throat clearing: relaxing shoulders, neck, chest, sniff and blow, hard swallow, water sips. Addressed use of strategies during conversational speech, as pt reporting can be challenging to engage in work meeting/appointments when occurring. Pt demonstrating x1 coughing episode, lasting ~20 seconds, responsive to SLP cues to use water and hard swallow. X6 throat clears, which was minimal compared to instances in which pt demonstrated mod-I employment of alternative. Generated strategy to further address stress in regards to pt perceived trigger for increasing upper body tension.   06-04-23: Pt entered with cough which persisted throughout session. Based on reported s/sx and triggers, SLP suspects chronic cough/VCD. Provided education and handout re: VCD and throat clear alternatives. Trained patient in diaphragmatic breathing and sniff-sniff-blow technique to suppress cough, in which pt able to demo with usual fading to occasional min A. Guided patient in self-analysis of upper body  tension given usual tension present in chest/neck. Tension resulted in breath hold during inhale/exhale cycle. Breathing pattern improved with use of trained techniques. Trialed SOVTE, which was impacted by impaired breath support. Will incorporate as able.   05-26-23: Briefly discussed 2 sub-systems of voicing (respiratory and phonatory). Dicussed plan to further target diaphragmatic breathing, forward projection of voice, and reduction in throat clears to optimize vocal quality. Pt verbalized understanding and denied further questions at this time.    PATIENT EDUCATION: Education details: see above  Person educated: Patient and Spouse Education method: Medical illustrator Education comprehension: verbalized understanding, returned demonstration, and needs further education  HOME EXERCISE PROGRAM: Abdominal breathing, forward resonance  GOALS: Goals reviewed with patient? Yes  SHORT TERM GOALS: Target date: 06/23/2023  Pt will reduce throat clearing and coughing by 50% at STG date using trained  techniques Baseline: > 9, 6 respectively Goal status: IN PROGRESS  2.  Pt will employ abdominal breathing during structured tasks with 80% accuracy given occasional min A  Baseline:  Goal status: IN PROGRESS  3.  Pt will employ abdominal breathing during structured conversation with 80% accuracy given occasional min A Baseline:  Goal status: IN PROGRESS  4.  Pt will maintain clear vocal quality in 5-10 minute structured conversations x2 given occasional min A  Baseline:  Goal status: IN PROGRESS    LONG TERM GOALS: Target date: 07/21/2023  Pt will reduce throat clearing and coughing by 75% at LTG date using trained techniques Baseline:  Goal status: IN PROGRESS  2.  Pt will employ abdominal breathing during unstructured conversation with 80% accuracy given rare min A Baseline:  Goal status: IN PROGRESS  3.  Pt will maintain clear vocal quality in 15+ minute unstructured  conversations x2 given rare min A  Baseline:  Goal status: IN PROGRESS  4.  Pt will report improved communication effectiveness via PROM by 2 pts at last ST session  Baseline: CPIB=9 Goal status: IN PROGRESS  ASSESSMENT:  CLINICAL IMPRESSION: Patient is a 67 y.o. M who was seen today for dysphonia. PMHX significant for Multiple Myeloma, interstitial lung disease, and GERD. Conducted ongoing education and instruction of cough suppression techniques, relaxed diaphragmatic breathing, throat clear alternatives, and clear voicing, with improving carryover exhibited. Pt would benefit from skilled ST intervention to train vocal hygiene protocol and voice techniques to optimize vocal quality and communication effectiveness.   OBJECTIVE IMPAIRMENTS: include voice disorder. These impairments are limiting patient from effectively communicating at home and in community. Factors affecting potential to achieve goals and functional outcome are co-morbidities. Patient will benefit from skilled SLP services to address above impairments and improve overall function.  REHAB POTENTIAL: Good  PLAN:  SLP FREQUENCY: 2x/week  SLP DURATION: 8 weeks  PLANNED INTERVENTIONS: Language facilitation, Environmental controls, Cueing hierachy, Internal/external aids, Functional tasks, Multimodal communication approach, SLP instruction and feedback, Compensatory strategies, Patient/family education, and Re-evaluation    Gracy Racer, CCC-SLP 06/09/2023, 3:11 PM

## 2023-06-11 ENCOUNTER — Ambulatory Visit: Payer: Medicare Other

## 2023-06-11 DIAGNOSIS — R498 Other voice and resonance disorders: Secondary | ICD-10-CM | POA: Diagnosis not present

## 2023-06-11 NOTE — Patient Instructions (Signed)
If you notice tension or tickle/itch, start to pre-medicate. Check for relaxed posture and focus on sniff-sniff-blow Focus on breathing pattern walking from car Focus on 5 deep breaths as soon   As the coughing is happening, think of me coaching you "Ragdoll. Sniff-sniff-blow. Ragdoll. Sniff-sniff-blow"

## 2023-06-11 NOTE — Therapy (Signed)
OUTPATIENT SPEECH LANGUAGE PATHOLOGY VOICE TREATMENT   Patient Name: Samuel [name] MRN: [id number] DOB:16-Nov-1955, 67 y.o., male Today's Date: 06/11/2023  PCP: Tally Joe, MD REFERRING PROVIDER: Tally Joe, MD  END OF SESSION:  End of Session - 06/11/23 1229     Visit Number 5    Number of Visits 17    Date for SLP Re-Evaluation 07/21/23    Authorization Type medicare/ BCBS    SLP Start Time 1230    SLP Stop Time  1315    SLP Time Calculation (min) 45 min    Activity Tolerance Patient tolerated treatment well                Past Medical History:  Diagnosis Date   Asthma    GERD (gastroesophageal reflux disease)    High cholesterol    under control.    History of blood transfusion    Hypothyroidism    Interstitial lung disease (HCC)    Multiple myeloma (HCC)    Perennial allergic rhinitis    Pneumonia    Sciatic pain, right    Seasonal allergic rhinitis    Thyroid disease    Past Surgical History:  Procedure Laterality Date   BRONCHIAL BIOPSY  06/18/2021   Procedure: BRONCHIAL BIOPSIES;  Surgeon: Leslye Peer, MD;  Location: [facility];  Service: Cardiopulmonary;;   BRONCHIAL BIOPSY  08/28/2021   Procedure: BRONCHIAL BIOPSIES;  Surgeon: Josephine Igo, DO;  Location: [facility];  Service: Cardiopulmonary;;   BRONCHIAL WASHINGS  06/18/2021   Procedure: BRONCHIAL WASHINGS;  Surgeon: Leslye Peer, MD;  Location: [facility];  Service: Cardiopulmonary;;   BRONCHIAL WASHINGS  08/28/2021   Procedure: BRONCHIAL WASHINGS;  Surgeon: Josephine Igo, DO;  Location: [facility];  Service: Cardiopulmonary;;   NASAL SINUS SURGERY     NECK SURGERY  1996   VIDEO BRONCHOSCOPY N/A 06/18/2021   Procedure: VIDEO BRONCHOSCOPY WITH FLUORO;  Surgeon: Leslye Peer, MD;  Location: [facility];  Service: Cardiopulmonary;  Laterality: N/A;   VIDEO BRONCHOSCOPY N/A 08/28/2021   Procedure: VIDEO BRONCHOSCOPY WITH FLUORO;  Surgeon: Josephine Igo, DO;  Location:  [facility];  Service: Cardiopulmonary;  Laterality: N/A;   Patient Active Problem List   Diagnosis Date Noted   Coronary artery disease 11/28/2022   Mixed hyperlipidemia 11/28/2022   Recurrent sinusitis 11/04/2022   Acute bacterial rhinosinusitis 09/26/2022   Chronic respiratory failure with hypoxia (HCC) 08/01/2022   S/P bronchoscopy    ILD (interstitial lung disease) (HCC)    Preop cardiovascular exam 08/21/2021   Pneumonitis 07/20/2021   Anxiety 07/20/2021   Interstitial pulmonary disease (HCC) 07/20/2021   Abnormal CT of the chest 06/18/2021   Dyspnea 06/15/2021   Hypoxia 06/15/2021   Multiple myeloma (HCC) 03/05/2021   Multiple myeloma not having achieved remission (HCC) 03/05/2021   Bone lesion 02/14/2021   Monoclonal gammopathy 02/14/2021   High cholesterol    Allergic rhinitis 11/23/2020   Mild persistent asthma 11/23/2020   SOB (shortness of breath) 10/04/2011    Onset date: 05/15/23 (referral date)  REFERRING DIAG: R49.0 (ICD-10-CM) - Dysphonia  THERAPY DIAG: Other voice and resonance disorders  Rationale for Evaluation and Treatment: Rehabilitation  SUBJECTIVE:   SUBJECTIVE STATEMENT: Checked pulse ox upon entrance. 95% after walking to ST room   PERTINENT HISTORY: "[name] is a 67 y.o. male who presents as a return patient, for follow-up of sinusitis and globus sensation. At time of last visit on 03/14/2023, nasal culture was obtained following thick  mucoid drainage noted in the right nasal cavity. This was abnormal, and demonstrated gram-positive bacilli. Patient was subsequently treated with a 2-week course of oral antibiotics, and presents today for posttreatment imaging. He has continued to use proton pump inhibitor twice daily, but continues to experience frequent throat clearing and globus sensation. He states he has been working on dietary and lifestyle modifications as previously instructed, and has been partially successful. He still has difficulty  with frequent throat clearing and needing to cough often. He also has not been able to obtain humidification for his oxygen, as the DME company stated they needed a prescription.  Last visit 03/14/2023: [name] is a 67 y.o. male who presents as a new consult, referred by [name], FNP , for evaluation and treatment of sinus issues, hoarseness, and throat symptoms. He is accompanied by his wife.  Patient had history of multiple myeloma, status post treatment with chemotherapy. He developed interstitial lung disease following chemotherapeutic treatment, and is currently followed closely by pulmonology. He also endorses history of significant environmental allergies, and reports that recent allergy testing showed positivity to "everything.". He was previously on immunotherapy, but this was held during his cancer treatment. He is considering starting back on the chemotherapy due to the significance of his symptoms. He is currently using Singulair, antihistamine and Patanase nasal spray on a daily basis. He also reports using nasal saline irrigations on a daily basis. He reports history of deviated nasal septum repair in 1983. He denies history of sinusitis requiring treatment with oral antibiotics in the last 12 months. His primary symptoms are nasal congestion and postnasal drainage. He denies facial pressure, pain, hyposmia, hypogeusia. He has not had any imaging of his head or sinuses. He does occasionally experience headaches. He uses oxygen on a nightly basis, and states that his oxygen is currently not humidified.  He reports symptoms of silent reflux, and states he is currently taking omeprazole on a daily basis. [name] is frequent throat clearing and globus sensation. He also reports occasional hoarseness."  PAIN:  Are you having pain? No  FALLS: Has patient fallen in last 6 months? No  LIVING ENVIRONMENT: Lives with: lives with their family Lives in:  House/apartment  PLOF:Level of assistance: Independent with ADLs, Independent with IADLs Employment: Full-time employment  PATIENT GOALS: "control my breath, control my speech, and relaxation"   OBJECTIVE:   DIAGNOSTIC FINDINGS: "Nasolaryngoscopy performed at last visit demonstrated moderate interarytenoid edema with otherwise normal upper airway exam. Counseled patient to continue twice daily omeprazole for next 2-3 months. We discussed dietary lifestyle modifications for treatment of reflux to include avoiding spicy and acidic foods which exacerbate reflux, avoiding late meals and snacking, avoiding caffeine and stimulants, avoiding carbonated and alcoholic beverages, adequate oral hydration, avoidance of throat clearing and sleeping with the head of bed elevated. Recommended follow-up with gastroenterology if symptoms do not improve after 3 months of twice daily PPI. Offered referral to speech-language pathology due to continued hoarseness and frequent throat clearing, patient was agreeable, order placed today."   COGNITION: Overall cognitive status: Within functional limits for tasks assessed  SOCIAL HISTORY: Occupation: Full time Agricultural engineer intake: optimal but varies - typically 3x16 oz of water Caffeine/alcohol intake:  None Daily voice use: moderate at work, minimal at home   PERCEPTUAL VOICE ASSESSMENT: Voice quality: hoarse, breathy, rough, low vocal intensity, and vocal fatigue Vocal abuse: habitual throat clearing, abnormal breathing pattern, and excessive voice use Resonance: normal Respiratory function: clavicular breathing  and speaking on residual capacity  OBJECTIVE VOICE ASSESSMENT: Maximum phonation time for sustained "ah": 71 (4-5 seconds max)  Conversational loudness average: 68 dB with intentional  Conversational loudness range: 65-70 dB S/z ratio: 1 but limited seconds (4 seconds) (Suggestive of dysfunction >1.0)  PATIENT REPORTED OUTCOME MEASURES  (PROM): Communication Participation Item Bank: 9 "quite a bit" for all situations  but "very much" for getting turn in fast moving conversation  TODAY'S TREATMENT:                                                                                                                                         06/11/23: Reported positive affirmations from colleagues re: improved voice quality and decreased coughing. Today, occasional min A required to ID and reduce upper body tension, use breathing techniques to suppress cough, and increase vocal intensity in conversation. Provided recommendations to aid awareness and mitigation of cough based on initial symptoms and how to manage breath support during transitions periods.   06/09/23: Reported good carryover of trained techniques to suppress cough/throat clearing with benefit endorsed. Pt able to independently reduce/suppress cough/throat clear with ~60% accuracy this session. Occasional min A otherwise needed, mostly to relax upper body. Trialed SOVT and resonant exercises, which were limited d/t breath support and cyclical nature of VCD. Targeted "clear" voice with cue to speak out, with benefit achieved at phrase level. Plan to address next session.   06/06/23: Reviewed cyclical nature of VCD/chronic throat clearing or coughing. SLP answered pt's questions regarding role of reflux. Pt able to teach back strategies to avoid chronic throat clearing: relaxing shoulders, neck, chest, sniff and blow, hard swallow, water sips. Addressed use of strategies during conversational speech, as pt reporting can be challenging to engage in work meeting/appointments when occurring. Pt demonstrating x1 coughing episode, lasting ~20 seconds, responsive to SLP cues to use water and hard swallow. X6 throat clears, which was minimal compared to instances in which pt demonstrated mod-I employment of alternative. Generated strategy to further address stress in regards to pt perceived trigger  for increasing upper body tension.   06-04-23: Pt entered with cough which persisted throughout session. Based on reported s/sx and triggers, SLP suspects chronic cough/VCD. Provided education and handout re: VCD and throat clear alternatives. Trained patient in diaphragmatic breathing and sniff-sniff-blow technique to suppress cough, in which pt able to demo with usual fading to occasional min A. Guided patient in self-analysis of upper body tension given usual tension present in chest/neck. Tension resulted in breath hold during inhale/exhale cycle. Breathing pattern improved with use of trained techniques. Trialed SOVTE, which was impacted by impaired breath support. Will incorporate as able.   05-26-23: Briefly discussed 2 sub-systems of voicing (respiratory and phonatory). Dicussed plan to further target diaphragmatic breathing, forward projection of voice, and reduction in throat clears to optimize vocal quality. Pt verbalized understanding and denied further questions at this time.  PATIENT EDUCATION: Education details: see above  Person educated: Patient and Spouse Education method: Medical illustrator Education comprehension: verbalized understanding, returned demonstration, and needs further education  HOME EXERCISE PROGRAM: Abdominal breathing, forward resonance  GOALS: Goals reviewed with patient? Yes  SHORT TERM GOALS: Target date: 06/23/2023  Pt will reduce throat clearing and coughing by 50% at STG date using trained techniques Baseline: > 9, 6 respectively Goal status: IN PROGRESS  2.  Pt will employ abdominal breathing during structured tasks with 80% accuracy given occasional min A  Baseline:  Goal status: IN PROGRESS  3.  Pt will employ abdominal breathing during structured conversation with 80% accuracy given occasional min A Baseline:  Goal status: IN PROGRESS  4.  Pt will maintain clear vocal quality in 5-10 minute structured conversations x2 given  occasional min A  Baseline:  Goal status: IN PROGRESS    LONG TERM GOALS: Target date: 07/21/2023  Pt will reduce throat clearing and coughing by 75% at LTG date using trained techniques Baseline:  Goal status: IN PROGRESS  2.  Pt will employ abdominal breathing during unstructured conversation with 80% accuracy given rare min A Baseline:  Goal status: IN PROGRESS  3.  Pt will maintain clear vocal quality in 15+ minute unstructured conversations x2 given rare min A  Baseline:  Goal status: IN PROGRESS  4.  Pt will report improved communication effectiveness via PROM by 2 pts at last ST session  Baseline: CPIB=9 Goal status: IN PROGRESS  ASSESSMENT:  CLINICAL IMPRESSION: Patient is a 67 y.o. M who was seen today for dysphonia. PMHX significant for Multiple Myeloma, interstitial lung disease, and GERD. Conducted ongoing education and instruction of cough suppression techniques, relaxed diaphragmatic breathing, throat clear alternatives, and clear voicing, with improving carryover exhibited. Pt would benefit from skilled ST intervention to train vocal hygiene protocol and voice techniques to optimize vocal quality and communication effectiveness.   OBJECTIVE IMPAIRMENTS: include voice disorder. These impairments are limiting patient from effectively communicating at home and in community. Factors affecting potential to achieve goals and functional outcome are co-morbidities. Patient will benefit from skilled SLP services to address above impairments and improve overall function.  REHAB POTENTIAL: Good  PLAN:  SLP FREQUENCY: 2x/week  SLP DURATION: 8 weeks  PLANNED INTERVENTIONS: Language facilitation, Environmental controls, Cueing hierachy, Internal/external aids, Functional tasks, Multimodal communication approach, SLP instruction and feedback, Compensatory strategies, Patient/family education, and Re-evaluation    Gracy Racer, CCC-SLP 06/11/2023, 12:30 PM

## 2023-06-12 ENCOUNTER — Ambulatory Visit (INDEPENDENT_AMBULATORY_CARE_PROVIDER_SITE_OTHER): Payer: Medicare Other | Admitting: Internal Medicine

## 2023-06-12 ENCOUNTER — Encounter: Payer: Self-pay | Admitting: Internal Medicine

## 2023-06-12 VITALS — BP 140/100 | HR 68 | Ht 70.0 in | Wt 205.8 lb

## 2023-06-12 DIAGNOSIS — J849 Interstitial pulmonary disease, unspecified: Secondary | ICD-10-CM

## 2023-06-12 DIAGNOSIS — R0609 Other forms of dyspnea: Secondary | ICD-10-CM

## 2023-06-12 LAB — PULMONARY FUNCTION TEST
DL/VA % pred: 126 %
DL/VA: 5.21 ml/min/mmHg/L
DLCO cor % pred: 35 %
DLCO cor: 9.36 ml/min/mmHg
DLCO unc % pred: 35 %
DLCO unc: 9.36 ml/min/mmHg
FEF 25-75 Pre: 1.48 L/s
FEF2575-%Pred-Pre: 55 %
FEV1-%Pred-Pre: 30 %
FEV1-Pre: 1.05 L
FEV1FVC-%Pred-Pre: 118 %
FEV6-%Pred-Pre: 27 %
FEV6-Pre: 1.18 L
FEV6FVC-%Pred-Pre: 105 %
FVC-%Pred-Pre: 26 %
FVC-Pre: 1.19 L
Pre FEV1/FVC ratio: 88 %
Pre FEV6/FVC Ratio: 100 %

## 2023-06-12 NOTE — Progress Notes (Signed)
Spiro/DLCO performed today. 

## 2023-06-12 NOTE — Patient Instructions (Signed)
Spiro/DLCO performed today. 

## 2023-06-12 NOTE — Patient Instructions (Addendum)
ILD (interstitial lung disease) (HCC) Drug-induced pneumonitis  Currently subjectively stable and with decent sit stand performance PFT shows worsening    Plan  - o2 for goal pulse ox > 88% - get echo next few weeks - get blood BNP 06/12/2023 - will call with these results; based on these test results consider Right heart cath  - spiro and dlco in 16 weeks  Cough and congestion/ASthma/SEasonal allergies  - active at the moment  - RAST panel positive for mild IgE elevation and dust mite allergy   Plan  -per Dr Rolla Callas  Obesity (BMI 30.0-34.9)  - Congratulations on ongoing weight loss that is intentional - Weight  209# on 06/12/2023   Plan -continue weight l;oss  - talk to PCP Tally Joe, MD about weight loss drugs  Myeloma    M protein < 0.8 in July 2024 and  on monitoring plan with consideration of Daratumumab   Plan  - per [facility] and Dr Elise Benne 16 weeks after completing PFT; can consider feno at followup

## 2023-06-12 NOTE — Progress Notes (Signed)
IOV 10/04/2011  67 year old male. Allergies and possible asthma. Hypothyroidism. Anxiety state but no formal diagnosis. 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 Reports dyspnea. INsidious onset "all my life". Increased after starting allergy shots in June 2012. Says during hikes after he gets going it gets better. Same in gym. Notices more when he is starting out or stationary at rest. HE is not sure what it is.  Feels he needs to take a deep breath on occasion. So, now referred to cardiology and pulmonary. Says ICS Qvar in august/sept 2012 made it worse. Outside chart mentions asthma but patient says not sure if he has asthma. But reports childhood hx of asthma diagnosed by a pediatrician at age 74. Says he had similar symptoms.  Up until 1990 used to 10K but even back then had similar symptoms and would need to warm up before feeling better. Then stopped running due to neck problems. Similar during swimming exercise in 1996-1997. Quit swimming 1999. Episodes associated with wheezing but no cough. Unclear if albuterol helps but on xopenex prn esp before walking. This whole thing feels like he is not getting "enough juice". Never had PFT but recollects normal spirometry at Dr Lyla Son office.  He has seen Dr Shon Baton cardiology for above - treadmill and echo and carotid doppler all pending. Current pulmonary modulating drug is Singulair only  Chest x-ray 07/16/2011 is clear per my personal review.  xxxxxxxxxxxxxxxxxxxxxxxxxxxxxxxxxxxxxxxxxxxxxxxxxxxxxxxxxxxxxxxxxx  Inpatient consult 06/16/21 67 y/o male with a history of multiple myeloma diagnosed in 2022, currently undergoing treatment presented to the [facility] emergency department from his hematology oncology appointment with a chief complaint of dyspnea.  The patient smoked cigarettes briefly as a young adult but since then has lived a relatively  healthy lifestyle staying active with exercise and playing golf.  He was diagnosed with multiple myeloma in the spring 2022 when a bruise was discovered while he was receiving a sports massage.  He went for further work-up and was found to have abnormal lab work, this led to a bone marrow biopsy confirming diagnosis of multiple myeloma.  He has been followed by Dr. Leonides Schanz who has been treating him with VRd chemotherapy since Mar 16, 2021.  He is currently in the middle of his fifth cycle.     Over the last several weeks he has been developing progressive shortness of breath with exertion.  He says this has been going on for about 3 to 4 weeks and has significantly worsened in the last week.  This is associated with a dry, rare cough.  He denies fevers, chills, leg swelling, weight gain, or mucus production.  In the last several days (just prior to admission) [place] the [place] and while there felt significant shortness of breath so he presented to Dr. Derek Mound office for further evaluation.  He was sent to the emergency room for evaluation further because of the severity of his shortness of breath.  In the emergency department he was noted to have hypoxemia to an O2 saturation of 87% and an abnormal chest exam.  Pulmonary and critical care medicine was consulted for further evaluation.  He says that throughout his adult life he has had what he calls "huffing" when he talks while exerting himself or with exertion.  He says he has been followed by an allergist and has been treated for asthma at times  over the years.  He does not regularly use an inhaler and he says this is not something that he routinely needs.  He was seen by my partner Dr. Marchelle Gearing at 1 point in 2013 during which time he had a normal chest x-ray and normal lung function testing.   He had COVID in April 2022.   June 15, 2021 admission August 26 CT chest images independently reviewed: No pulmonary embolism, diffuse interlobular septal  thickening, pleural nodularity noted, areas of groundglass opacification with some patchy distribution particularly in the lung base, no significant pleural effusion, mild reactive appearing lymphadenopathy in the mediastinum Acute respiratory failure with hypoxemia in the setting of diffuse parenchymal lung disease of undetermined etiology: This is somewhat of a complex case given his underlying asthma and the fact that he had COVID in 2022 and in April 2022 a CT scan of his abdomen showed some ill-defined interstitial changes in the periphery of his lungs on lung windows in the bases.  Based on the time course of his illness and reports in the literature I am most concerned about [name] lung toxicity (see citation below), though it is also important to evaluate for another underlying and progressive primary pulmonary ILD or less likely an opportunistic infection (history doesn't seem to support this).  [name] can cause eosinophilic pneumonia which we can assess for with a bronchoscopy, but his imaging characteristics aren't typical for this.  It is possible that the non-specific interstitial changes seen on his lungs in April 2022 were related to his recent COVID infection.  I don't think his baseline asthma explains his symptoms, though he did have a high serum eosinophil count a few weeks prior to admission (unclear significance).  Saglam B, Tye [place] M, Ornek S, Keske S, Tabak L, Cakar N, Zeren H, Aytekin S, [place], Ferhanoglu B. Bortezomib induced pulmonary toxicity: a case report and review of the literature. Am J Blood Res. 2020 Dec 15;10(6):407-415. PMID: 40981191; PMCID: [id number].  06/18/21 -   Results for BENAJAMIN, COPLAND" (MRN [id number]) as of 07/12/2021 09:13  Ref. Range 06/18/2021 12:57  Monocyte-Macrophage-Serous Fluid Latest Ref Range: 50 - 90 % 5 (L)  Other Cells, Fluid Latest Units: % CORRELATE WITH CYTOLOGY.  Color, Fluid Latest Ref Range: YELLOW  PINK (A)  Total  Nucleated Cell Count, Fluid Latest Ref Range: 0 - 1,000 cu mm 103  Fluid Type-FCT Unknown BRONCHIAL ALVEOLAR LAVAGE  Lymphs, Fluid Latest Units: % 18  Eos, Fluid Latest Units: % 51  Appearance, Fluid Latest Ref Range: CLEAR  HAZY (A)  Neutrophil Count, Fluid Latest Ref Range: 0 - 25 % 26 (H)  FINAL MICROSCOPIC DIAGNOSIS:  - No malignant cells identified  - Benign bronchial cells and pulmonary macrophages   Titrate off solumedrol and start prednisone 50 mg daily. Decrease by 10 mg weekly    OV 07/12/2021  Subjective:  Patient ID: Samuel [name], male , DOB: [birth year] , age 72 y.o. , MRN: [id number] , ADDRESS: [address] [place] [address] PCP Tally Joe, MD Patient Care Team: Tally Joe, MD as PCP - General (Family Medicine)  This Provider for this visit: Treatment Team:  Attending Provider: Kalman Shan, MD    07/12/2021 -   Chief Complaint  Patient presents with   Hospitalization Follow-up    Pt states he was doing better after being out of the hospital after being placed on steroids. States about a week after being out, states he started having more problems with  SOB and to today, he has had problems with SOB with exertion.   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 -presents for follow-up from the hospital.  I originally met him 10 years ago and then at that point in time he had dyspnea.  He tells me that after that he was exercising and living a good life.  Then in April 2022 got diagnosed incidentally with myeloma multiple.  Says he was caught early.  Then in May 2022 started on [name] and [name].  He says he was doing well with this up until mid July.  Up until then he was walking 1-1/2 2 miles per day 5 days a week.  Then in late July 2022 started noticing shortness of breath.  He met Dr. Leonides Schanz his oncologist on May 25, 2021.  By June 01, 2021 he had severe pulmonary  infiltrates.  He says a part of then he was getting his [name] once a week along with some steroids with each dose.  At this point in time the steroid dose was cut down but he progressed and started getting worse.  Then on June 15, 2021 he was in significant respiratory distress and admitted to the hospital.  I reviewed the hospital records.  He was there for 3 days.  He underwent bronchoscopy with BAL there is also 51% eosinophilia.  Cultures are negative malignant cells are negative.  He was seen by my pulmonary colleagues.  Discussion was held with the [name] other [name] was the etiology.  Given the use and failure it looks like his [name] has been held.  He was discharged on 50 mg prednisone with advised to taper it by 10 mg/week.  Currently is on 20 mg prednisone.  He started playing golf again and feeling really good through Labor Day weekend..  Then on June 29, 2021 his [name] was not given.  Up until this point all chemo was held.  He was given Cytoxan, Zomenta and [name].  The very next in June 30, 2021 he started feeling worse.  He feels [name] is the etiology for this.  On July 06, 2021 he communicated this with Dr. Leonides Schanz and all chemo has been held.  He is being referred to the bone marrow transplant program at [facility] for further evaluation.  At this point in time he says he is much better than when he was in the hospital but definitely not as good as he was prior to getting a rechallenge with [name] on June 29, 2021.  But he notices that he slowly improving.  Walking desaturation test shows that his pulse ox is holding up although he does have a tendency to desaturate.   Did extensive review of the literature.  According to up-to-date [name] is a known etiology for pulm eosinophilia but he got worse after getting rechallenged with [name].  Review of the literature shows that [name] can cause drug-induced pneumonitis and a small fraction of patients.  The  literature is  NO INFOR  as to whether these patients have eosinophilia or not but definitely they seem to be steroid responsive.  This literature is attached below.  In the timeframe also fits in with [name].  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  He currently has significant symptoms of shortness of breath.  He does not have oxygen with him at home.  Simple office walk 185 feet x  3 laps goal with forehead probe 07/12/2021    O2 used ra   Number laps completed 3   Comments about pace slow   Resting Pulse Ox/HR 100% and 69/min   Final Pulse Ox/HR 92% and 81/min   Desaturated </= 88% no   Desaturated <= 3% points Yes 8   Got Tachycardic >/= 90/min no   Symptoms at end of test No complaints   Miscellaneous comments x        Causes of Pulmonary Eosinophilia: from UPTODATE A. Nonsteroidal antiinflammatory drugs (NSAIDs) B. Nitrofurantoin  C. Minocycline D. Sulfonamides/ Sulfamethoxazole E. Ampicillin F. Daptomycin G.Vancomycin H. Dapsone I. Beta-lactam antibiotics J. Nevirapine K. Telaprevir L. Sulfasalazine M. Methotrexate N. Mesalamine O. Amiodarone P. Bleomycin Q. ACE Inhibitor R. Beta blocker S. Hydrochlorothiazides T. L- Tryptophan U. Allopurinol V. Carbamazepine W. Lamotrigine X. Phenytoin Y. Phenindione Z. Fluindione AA. Olanzapine AB. Oxcarbazepine AC. Strontium Ranelate AD. Lenalidomide AE. Radiographic contrast media  Saglam B, Kalyon H, Ozbalak M, Ornek S, Keske S, Tabak L, [place], Zeren H, Aytekin S, [place], Mora B. Bortezomib induced pulmonary toxicity: a case report and review of the literature. Am J Blood Res. 2020 Dec 15;10(6):407-415. PMID: 16109604; PMCID: [id number]. - Bortezomib  related pulmonary toxicities are rarely reported. Although the incidence of Bortezomib induced lung injury (BLI) is unknown, in a large registry study of 1010 MM patients, 45 patients were reported to have BLI by their physician. However, the causality could only be constituted in 26 patients (2.6%), with 5 of them resulting in death despite steroid treatment.In the case reports, the average number of RVD cycles until the toxicity was presented was 6.9, and the period between the development of pulmonary toxicity and the first dose of Bortezomib was 31.1 days -No mention of eosinophilia  Bortezomib therapy-related lung disease in [place] patients with multiple myeloma: Incidence, mortality and clinical characterization Charolette Child [facility] Miyao,2 [place] Masahiko Kusumoto,5 [place] Fumikazu NWGNF,6 Orinda Sugiyama,8 Kiyohiko Hatake,9 Jenelle Mages Fukuda,10 and Genene Churn [id number]  - Of the 1010 patients registered, 45 (4.5%) developed BILD, 5 (0.50%) of whom had fatal cases. The median time to BILD onset from the first bortezomib dose was 14.5 days, and most of the patients responded well to corticosteroid therapy. A retrospective review by the Lung Injury Medical Expert Panel revealed that the types with capillary leak syndrome and hypoxia without infiltrative shadows were uniquely and frequently observed in patients with BIL - no mention of eosinophilia  [email or web address] =- allopurinol complex multisystem Hypersensitivityey   has a past medical history of Asthma, High cholesterol, Perennial allergic rhinitis, Sciatic pain, right, Seasonal allergic rhinitis, and Thyroid disease.   reports that he quit smoki     07/20/2021  - Visit, NP Cherre Huger    67 year old male former smoker followed in our office for shortness of breath and interstitial lung disease.  Established with Dr. Marchelle Gearing.  Initially  consulted when inpatient with our team in August/2022.  Chief complaint at that point time was dyspnea.  He was diagnosed with multiple myeloma in the spring 2022 when a bruise was discovered while he was recovering from a sports massage.  He went for further work-up and he was found to have abnormal lab work this led to a bone marrow  biopsy confirming diagnosis of multiple myeloma.  He has been followed by Dr. Leonides Schanz.  He was in the middle of treating him with VRD chemotherapy since May/20 04/2021.  He was in the middle of his fifth cycle.  Patient also had COVID in April/2022.  Chest CT in April/2022 showed ill-defined interstitial changes in the periphery.  Concern for [name] lung toxicity.  Patient also had a bronchoscopy performed in 06/18/2021.  Eosinophils 51%, lymphs 18%, no malignant cells identified patient was started on a long-term prednisone taper.  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  Still currently having significant symptoms of shortness of breath.    Last seen in our office on 07/12/2021 by Dr. Marchelle Gearing.  Plan of care at that office visit was as follows: Stop [name], check chest x-ray, check CBC with differential, blood ESR, blood IgE, vitamin D, hemoglobin A1c, G6PD, walk test, check Ono at home, change prednisone to 50 mg daily for 2 weeks, then 40 mg daily for 2 weeks, then 30 mg daily for 2 weeks, then 20 mg daily for 2 weeks, 2-week follow-up with APP or Dr. Marchelle Gearing, 4-week follow-up with Dr. Marchelle Gearing and 30-minute time slot.  Per chart review on 07/13/2021 [name] was discontinued.  Patient was also seen by Dr. Leonides Schanz on 07/18/2021 I am unable to view this note.  Patient reporting that weight team would like for him to resume [name] at a maintenance dose of 10 mg.  This is the current plan.  He will see oncology next Friday for blood work.  Patient is scheduled to complete an overnight oximetry test next Tuesday.  He remains  adherent to his prednisone taper 50 mg daily.  Patient reports that this weekend he did play 9 holes of golf on Saturday and on Sunday.  He was limited by his physical exertion.  He also exercised on Tuesday and worked out with a Psychologist, educational.  Patient is eager to get back to baseline physical activity.  Patient reports that on Wednesday (07/18/2021) of this week he had increased shortness of breath, cough, worsened acid reflux and he vomited.  Patient believes that this may also be due to the fact that he ate a dinner meal quite quickly.  This sometimes happens when he does this.  He also reports that his blood pressure was high at that time.  Patient reports that he has been off the [name] for at least 1 month.  Patient has stopped his allopurinol as of 07/18/2021.  Patient and spouse are both frustrated regarding dyspnea and have hopes that he would be improving quicker.  There are also concerns that he may have acute worsened symptoms or an acute infection such as bronchitis.  We will discuss and evaluate for this today.  Walk today in office: 07/20/2021-completed 2 laps on room air, dropped to 93%  07/26/2021- Interim hx  Patient presents today for 1 week follow-up. ILD felt to be related to drug pneumonitis from [name] as well as [name] for his treatment of multiple myeloma. BAL showed significant eosinophilia. Dr. Leonides Schanz lowered dose of [name] to 10mg  daily. CXR on 07/20/21 showed chronic ILD, no definite acute findings. Ambulatory walk during his last visit showed no oxygen desaturations. During his last visit he was ordered for HRCT, PFTs and ONO.   Accompanied by his wife. He had a bad weekend, his respiratory symptoms have been slightly better the last two days. He is currently off BOTH [name] and [name] (may retry [name]  at lower dose). He stopped using BREO d/t throat irritation. Xanax has helped relieves some anxiety and chest tightness. Wife reports that he is sleeping better. He in  on prolonged prednisone taper. He will be starting 40mg  prednisone tomorrow  x 2 weeks. He is taking Singulair and generic fluticasone nasal spray. He has been off allergy shots since August. HRCT and PFTs are scheduled for next week. Awaiting results to be faxed for ONO from [employer].    OV 08/02/2021 -   Subjective:  Patient ID: Samuel [name], male , DOB: Jul 01, 1956 , age 54 y.o. , MRN: [id number] , ADDRESS: [address] [place] [address] PCP Tally Joe, MD Patient Care Team: Tally Joe, MD as PCP - General (Family Medicine)  This Provider for this visit: Treatment Team:  Attending Provider: Kalman Shan, MD  Type of visit: Telephone/Video Circumstance: COVID-19 national emergency Identification of patient RYOT CORNELISON with 05-17-1956 and MRN [id number] - 2 person identifier Risks: Risks, benefits, limitations of telephone visit explained. Patient understood and verbalized agreement to proceed Anyone else on call:  - [phone] Patient location: home + wife on speaker This provider location: 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 CT abdomen/pelvis: Multiple small lytic lesions in the thoracolumbar spine and bilateral pelvis -SPEP: IgG 2,082 (H), M Protein 1.8 (H). IFE shows IgG monoclonal protein with kappa light chain specificity.  -LDH 169, CBC normal, CMP normal except for sodium 131 (L), Chloride 96 (L).   02/14/2021: Establish care with Georga Kaufmann PA-C 02/22/2021: bone marrow biopsy confirms the diagnosis of Multiple Myeloma with a monoclonal plasma cell population.  03/16/2021: Cycle 1 Day 1 of VRd chemotherapy  04/06/2021: Cycle 2 Day 1 of VRd chemotherapy  04/27/2021: Cycle 3 Day 1 of VRd chemotherapy  05/18/2021: Cycle 4 Day 1 of VRd chemotherapy  06/01/2021: drop  dexamethasone to 20mg  PO weekly and start lasix due to shortness of breath.  06/15/2021: Desaturation to 87% on ambulation. HELD velcade today and sent to ED for evaluation.  06/22/2021: Findings consistent with drug reaction the lungs with eosinophils on BAL.  Given these findings we will definitely hold [name] and plan to avoid pomalidomide 06/29/2021: Cycle 1 Day 1 CyBorD chemotherapy   HPI Srithik Quirin Bushnell 67 y.o. -there is a telephone visit.  He is supposed to see me next week but he had a CT scan of the chest 2 days ago and had pulmonary function test today.  He really wanted to discuss the results today itself.  Review of the records indicate that he still on prednisone taper.  He is able to ambulate and desaturated only after 200 feet.  There are some mild nocturnal desaturation.  We started 2 L of nasal cannula oxygen.  His CT scan of the chest shows diffuse groundglass opacity in a pattern that is inconsistent with UIP [less than 40% chance this is UIP] suggestive of alternate pattern.  He says he is able to do weight training exercises well but when he walks on a treadmill or does ambulation that is when it bothers him.  When he rests he is better.  He does desaturate to 80s percent.  He is wondering if this could be from asthma.  I told him otherwise.  Touch base with Dr. Leonides Schanz his oncologist.  He is scheduled  for cyclophosphamide tomorrow along with low-dose [name].  This was held off recently in September 2022.  But oncology is wanting to rechallenge him with low-dose [name].  They wanted approval for this.   ONO RA 07/24/21   - - 45 min spent <88%. 7+ hours was > 90%. OVerall not bad  Plan  Start 2L [place] [facility]   CT Chest data  CT Chest High Resolution  Result Date: 07/31/2021 CLINICAL DATA:  Evaluate for interstitial lung disease EXAM: CT CHEST WITHOUT CONTRAST TECHNIQUE: Multidetector CT imaging of the chest was performed following the standard protocol without intravenous  contrast. High resolution imaging of the lungs, as well as inspiratory and expiratory imaging, was performed. COMPARISON:  Chest CT dated June 15, 2021; abdomen and pelvis CT dated February 02, 2021 FINDINGS: Cardiovascular: Cardiomegaly with trace pericardial effusion. Coronary artery calcifications of the RCA and LAD. Atherosclerotic disease of the thoracic aorta. Mediastinum/Nodes: Esophagus is unremarkable. Atrophic thyroid. Mediastinal lymph nodes are decreased in size when compared with prior exam. Reference AP window lymph node on series 2, image 42 measures 1.1 cm in short axis, previously 1.3 cm. Lungs/Pleura: Central airways are patent. Images are motion degraded. Mild diffuse ground-glass opacity with peribronchovascular and subpleural reticular glass opacities and traction bronchiectasis. No clear craniocaudal predominance. Mild bilateral air trapping. 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 Stable solid right middle lobe pulmonary nodule measuring 3 mm on series 3, image 57. Upper Abdomen: No acute abnormality. Musculoskeletal: No chest wall mass or suspicious bone lesions identified. IMPRESSION: Limited evaluation due to respiratory motion artifact. Within limitations, there are diffuse bilateral ground-glass opacities with peribronchovascular and subpleural reticular opacities, traction bronchiectasis and no clear craniocaudal predominance. Differential considerations include sequela of acute lung injury, NSIP, or fibrotic HP given presence of air trapping. Mild subpleural reticular opacities were present on visualized portions of the lung on prior abdomen and pelvis CT dated February 02, 2021, although majority of findings are new. Findings are suggestive of an alternative diagnosis (not UIP) per consensus guidelines: Diagnosis of Idiopathic Pulmonary Fibrosis: An Official ATS/ERS/JRS/ALAT Clinical Practice Guideline. Am Rosezetta Schlatter Crit Care Med Vol 198, Iss 5, [phone], Jun 21 2017.  Small solid pulmonary nodule the right middle lobe measuring 3 mm. No follow-up needed if patient is low-risk. Non-contrast chest CT can be considered in 12 months if patient is high-risk. This recommendation follows the consensus statement: Guidelines for Management of Incidental Pulmonary Nodules Detected on CT Images: From the Fleischner Society 2017; Radiology 2017; 284:228-243. Aortic Atherosclerosis (ICD10-I70.0). Electronically Signed   By: Allegra Lai M.D.   On: 07/31/2021 12:01         08/16/2021 -  fu drug induced pneumonitis   HPI Jassiah Liberti Mangieri 67 y.o. - desaturated. STarted on portable o2 since yesterday and Is feeling better. On Room air say he is desaturated.  Called to discuss Bronch . He is scheduled to see DR PAtwardhan 08/22/21 wed at 8.30am. PRefers not to have bronch done at that that tme.  Discussed the consensus about having bronchoscopy with lavage to rule out any opportunistic infections.  At this time I took the opportunity of also recommending transbronchial biopsies.  He did have transbronchial biopsies when he was a lot more hypoxemic in August 2022 we will send for histopathology and was nondiagnostic.  This time I recommended we send it off for RNA genomic classifier for UIP it is not a sensitive test but it is specific.  If it comes back  positive then we would know that there is permanency to this and also its a marker of progression potentially.  He is willing to go through this.  Explained we under general anesthesia. Based on schedule will be myself or Dr. Tonia Brooms doing it.  Explained the following risks   Risks of pneumothorax, hemothorax, sedation/anesthesia complications such as cardiac or respiratory arrest or hypotension, stroke and bleeding all explained. Benefits of diagnosis but limitations of non-diagnosis also explained. Patient verbalized understanding and wished to proceed.    We then discussed pulse dose steroids.  Told him we will have to wait  close to a week to make sure there is fungal smears PCP and AFB smears and bacterial culture negative.  At that point we will have to take a decision on giving 1 g Solu-Medrol for the a day for 3 days.  Ideally would need admission he does not want this.  He wants to do with is on an outpatient setting ideally.  Explained to him about the risks with steroids such as hyperglycemia, hypertension but said that I would try to work with the DME company or outpatient/home nursing to see if this would be possible.  He was appreciative.  Also discussed with Dr. Tonia Brooms who is willing to do the biopsy based on schedule of the operating room, myself and the patient if things do not work out.  Sent a secure chat to Dr. Rosemary Holms.  Told him about the patient.  He will give [place] a clearance on 08/22/2021.  Recommended also look for BNP and heart failure.  CT Chest data  No results found.   OV 08/09/2021  Subjective:  Patient ID: Samuel [name], male , DOB: 1956/06/04 , age 3 y.o. , MRN: [id number] , ADDRESS: [address] [place] [address] PCP Tally Joe, MD Patient Care Team: Tally Joe, MD as PCP - General (Family Medicine)  This Provider for this visit: Treatment Team:  Attending Provider: Kalman Shan, MD    08/09/2021 -   Chief Complaint  Patient presents with   Follow-up    Pt states he is about the same since last visit, maybe a little better. Pt is coughing more at times and states he does cough a lot before bed.   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 -returns for follow-up.  He is finishing up 40 mg of prednisone per day.  Restarting 30 mg/day of prednisone tomorrow.  He is not really better.  Today he was able to only walk 2 out of the customary 3 laps.  And he showed a tendency to desaturate.  He says he is extremely anxious.  This is understandable.  He also states  that when he lifts weights in the gym he does not have a problem but when he exerts himself he feels worse.  Previously his nocturnal desaturation test showed abnormality and we recommended night oxygen but he declined per the CMA.  But today he and his wife tell me that they would be interested in getting some oxygen if it would help with shortness of breath.  Simple walking desaturation test does not make him qualify and will need a 6-minute walk test to qualify for portable oxygen.  They are also worried about the lack of improvement.  They are wondering about second opinion.  I did unofficially check with some of my colleagues in the [place].  No clear-cut plan has been  developed.  We discussed about the possibility of visiting Dr. Marcie Bal, ILD group in [place].  They seemed enthused about the idea but wanted me to reach out to Dr. Harrold Donath first.  In terms of his myeloma review of the medical records from office visit 08/03/2021 with Dr. Leonides Schanz and talking to the patient and the labs show the myeloma still in remission.  But Dr. Leonides Schanz is extremely concerned that the myeloma will come back.  Oncology still wants to rechallenge him with [name] but after my personal discussion with Dr. Leonides Schanz on 08/02/2021 and concern for pulmonary toxicity chemotherapy is on hold till patient recovers.  He is also concerned about the presence of coronary artery calcification on his recent CT scan of the chest.  His dad had coronary artery disease diagnosed at 46 and had bypass.  He wants to visit this with Dr. Jacinto Halim again       08/31/2021 -video visit to discuss bronchoscopy results from 08/28/2021 and next step in plan    HPI ROWEN BEICHLER 67 y.o. -his bronchoscopy with lavage 08/28/2021 shows 40% lymphocytes.  Anything greater than 30% is against UIP.  His RNA genomic classifier is in progress and that would be a specific test although not a ensitive test for UIP.  Bacterial cultures are  negative.  He continues have shortness of breath and on room air at rest he is fine but sometimes when he goes to the bathroom he can desaturate into the high 80s.  He is frustrated by his condition.  Our original plan was to ensure no opportunistic or bacterial infections [fully understanding that MTB and fungal infections can take 6 weeks] with the current bronchoscopy and to consider bronchoalveolar lavage.  And then based on this we will schedule pulsed dose steroids.    Recently his G6PD is returned is normal.  He is not on Bactrim for prophylaxis.  Current prednisone Is 20mg  per day.  He prefers outpatient treatment plan.  We discussed the side effects of high-dose steroids including opportunistic infection, anxiety and irritability, hypertension, diabetes, other lab abnormalities.  Explained the upset benefit of potentially improving upon current ILD active inflammatory phase.  He is willing to go through this treatment.  He prefers outpatient.  He will require lab and vital sign monitoring.   his wife wanted to know if this could be sarcoidosis.  She is read some case reports of sarcoidosis and myeloma associated.  I expressed to them the clinical features do not fit in with sarcoidosis.  However we can check for autoimmune and sarcoid features  Results for ZAIDEN, MCCULLEN" (MRN [id number]) as of 08/31/2021 16:38 ENVISIA - NEGATIVE FOR UIP   Ref. Range 06/18/2021 12:57 08/28/2021 16:51  Monocyte-Macrophage-Serous Fluid Latest Ref Range: 50 - 90 % 5 (L) 10 (L)  Other Cells, Fluid Latest Units: % CORRELATE WITH CYTOLOGY. MESOTHELIAL AND BRONCHIAL LINING CELLS  Color, Fluid Latest Ref Range: YELLOW  PINK (A) COLORLESS (A)  Total Nucleated Cell Count, Fluid Latest Ref Range: 0 - 1,000 cu mm 103 183  Fluid Type-FCT Unknown BRONCHIAL ALVEOLAR LAVAGE BRONCHIAL ALVEOLAR LAVAGE  Lymphs, Fluid Latest Units: % 18 40  Eos, Fluid Latest Units: % 51 0  Appearance, Fluid Latest Ref Range: CLEAR  HAZY (A)  CLEAR (A)  Neutrophil Count, Fluid Latest Ref Range: 0 - 25 % 26 (H) 50 (H)    CT Chest data  DG CHEST PORT 1 VIEW  Result Date: 08/28/2021 CLINICAL DATA:  Status  post bronchoscopy. EXAM: PORTABLE CHEST 1 VIEW COMPARISON:  Chest radiographs 07/20/2021 and CT 07/30/2021 FINDINGS: The cardiac silhouette is borderline enlarged. Lung volumes are chronically low and slightly lower than on the prior radiographs. The interstitial markings are chronically increased diffusely. No definite acute airspace consolidation, overt pulmonary edema, sizable pleural effusion, or pneumothorax is identified. Prominent gaseous distension of the stomach is partially visualized. IMPRESSION: Low lung volumes with chronic interstitial changes. Electronically Signed   By: Sebastian Ache M.D.   On: 08/28/2021 19:10   DG C-ARM BRONCHOSCOPY  Result Date: 08/28/2021 C-ARM BRONCHOSCOPY: Fluoroscopy was utilized by the requesting physician.  No radiographic interpretation.       OV 09/25/2021  Subjective:  Patient ID: Samuel [name], male , DOB: [birth year] , age 70 y.o. , MRN: [id number] , ADDRESS: [address] [place] [address] PCP Tally Joe, MD Patient Care Team: Tally Joe, MD as PCP - General (Family Medicine)  This Provider for this visit: Treatment Team:  Attending Provider: Kalman Shan, MD    09/25/2021 -   Chief Complaint  Patient presents with   Follow-up    Pt states that he is beginning to feel better after last visit. States he wears his O2 at 2L majority of the time.  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 Jeanie Sewer - 40mg  once weekly (for multiple myeloma chemotherapy N/V ppx)   04/09/21 - dexamethasone 4mg  tabs - 40  tabs for 28 day supply - Dr. Jeanie Sewer - 40mg  once weekly  (for multiple myeloma chemotherapy N/V ppx)   05/07/21 - dexamethasone 4mg  tabs - 40 tabs for 28 day supply - Dr. Jeanie Sewer - 40mg  once weekly  (for multiple myeloma chemotherapy N/V ppx)   06/06/21 - dexamethasone 4mg  tabs - 40 tabs for 28 day supply - Dr. Jeanie Sewer - decreased to 20mg  once weekly  06/18/21 - prednisone 10mg  tabs - 104 tabs for 34 day supply - Dr. Noralee Stain ------50mg  daily x 7 days, 40 mg daily x 7 days, 30 mg daily x 7 days, 20mg  daily x 7 days, 10mg  daily x 7 days, 5mg  daily x 7 days   07/13/21 - prednisone 10mg  tabs - 296 tab for 30 day supply - Dr. Marchelle Gearing ------50mg  daily x 14 days, 40 mg daily x 14 days, 30mg  daily x 14 days, 20mg  daily thereafter  - Mid Nov 2022  - 1gm solumedrol load x 3 days as outpatient  - 09/25/2021 - 20mg  pred per day   HPI Sudhir Ege Duross 67 y.o. -returns for follow-up.  Since his last visit we did a loading dose of Solu-Medrol 1 g daily x3 days.  This was in mid November 2022.  After that he is gone back to daily prednisone 20 mg/day.  In the midst of the high-dose steroid he did pick up hypertension and we gave him bisoprolol which he says has helped him significantly.  He is run out of the bisoprolol.  I have asked him to contact his primary care physician to manage his hypertension but we will give him a refill.    He is here for follow-up to see his current status.  He tells me that his effort tolerance is better.  He tells me in the gym he is able to do a little bit more work.  This is compared to a few months ago.  He does tell me that gym exercises are easier on him than climbing the stairs.  Stairs - he avoids and gets dyspneic. Not tested his pulse ox on stairs His subjective symptom profile is slightly better compared to October 2022 but his walking desaturation test is around the same.   So suspect amount of his interstitial lung disease might be better but suspect  still remains.  Review of his pulmonary function test from 10 years ago was normal.  In April 2022 he had early ILD.  He currently definitely has ILD.  His RNA genomic classifier is negative.  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  This would make him eligible for nintedanib. He continues on prednisone 20 mg/day.  In terms of his myeloma: He had his wife say that it is still under remission but they are worried about relapse.  They are worried about future direction and treatment of myeloma particularly because he has had issues with treatment that then resulted in acute lung injury.   Of note  - he is frustrated by poor customer service of our office workflows - Rosann Auerbach denies his RNA genomic classifer biopsy    OV 10/19/21  S: call to give update on conversation with Dr Leonides Schanz his hematologist  1. Myeloma - latest dec 2022 blood work back - still under remission. Dr Leonides Schanz indicated that highly unlikely he will be a BMT Candidate for myeloma if his lung. Has appt pending with [facility]. If not a BMT candidate - then cytoxan regimen short term would be used (indicated that is of ptioential benefit to lungs)  2. 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 Would need SLB to ID etiology preciesly but with myelom and Thereapuetic trial with steroids  and his presentation - this has not been a consideratgion till now.  Explained to Dr Leonides Schanz if ILD progresses futher - patient life expectancy is limited. Discussed with patient again and he is agreeable for prednisone 15mg  per day and starting ofev (awaiting donor samples and insurance proces in 2023).   3. WE discussed that probably best to refer to [facility]  trans[facility] and hematology to see if lung transplant would be an option at all if he were to decline esp in setting of myeloma. Maybe a BMT as well . Do not know answers but wil email Dr Thad Ranger at [facility] to get patient in for visit. He might well need a surgical lung biopsy but this can be addressed in due course  Patient and wife agreeable  I spent time emailing Dr Acquanetta Belling trasnplant doc at [facility] - later heard from Dr Thad Ranger- feels that Myleoma will need to be in remssion for >= 5 years before he can be considered lung transplant evaluable. They feel no need to see Mr Ballard in transplant clinicl. I subsequetly d/w Mr Overbaugh - will revert back to holding off lung transplant evaluation. He wil proceed with ofev. He will see [facility] BMT team. Will cosndier a [facility] ILD clinic opinion after d;w hi  2nd Pulm Opinion at [facility] - Dr Sharmon Revere    Comment: The patient seems to have an inflammatory process that has resulted in progressive loss of lung function, worsening hypoxia. Unfortunately on the most recent imaging I do see some signs of fibrosis including a few areas of traction bronchiectasis although I do not see honeycombing or profuse traction bronchiectasis. We did discuss future therapies. I told him that unfortunately since his lung has already suffered injury I think he would be at risk for developing lung injury again and that many chemotherapeutics have been associated lung injury. I also told him these are idiosyncratic reactions and it was not possible to predict who on an individual basis would develop inflammation from chemotherapy to any specific agent. He brought up Cytoxan I told him that while Cytoxan issues for inflammatory lung disease on the other hand it has been associated with pulmonary inflammation. His inflammatory process is steroid responsive which is not unexpected given that eosinophils were found on BAL.  We will check oxygen assessment with exercise to  see how much oxygen as needed with more than ordinary exertion.  Plan:    - Check oxygen assessment  Will discuss with his pulmonary provider Dr. Marchelle Gearing and then Dr. Anne Hahn  Thank you for the opportunity to provide consultation for your patient. If I can be of further assistance please do not hesitate to contact my office.   [facility]   ASSESSMENT / PLAN  1. Interstitial lung disease-severe DLCO reduction (33% predicted) 2. 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 [name] induced)-started August 2022 3. Faint/early ILD present in April 2022 (even prior to starting myeloma treatment) 4. Hypothyroidism-on replacement with Armour thyroid 5. Dyslipidemia 6. History of gout 7. Chronic allergic rhinitis and some sinusitis-on nasal steroids 8. Childhood history of asthma 9. 25 year history of allergy shots (between ages 45 and 41)  30. Multiple myeloma-diagnosed April 2022-received about 5 cycles of [name]/[name]/dexamethasone until August 2022    There definitely appeared to be some very early changes of ILD even back in April 2022 prior to him starting any of his myeloma treatments. However, there definitely appears to be an acute interstitial pneumonitis type picture noted on his CT scan from 06/15/2021. He was taken off all myeloma treatments at that time with concern for drug-induced pneumonitis with both [name] as well as [name] being considered probably agents. He subsequently underwent 1 additional cycle of myeloma treatment with [name] alone (without [name]) and had an acute exacerbation of dyspnea symptoms and ever since then has not received any further myeloma treatment.   I discussed with him that he has been on steroids since about August 2022 and received a steroid bolus in November 2022 with very little change overall in his lung functions. I have mentioned his serial lung function numbers in the HPI above. I do not see any significant change in  these numbers despite him receiving excellent care with Dr. Marchelle Gearing in [place].   His CT chest currently shows mostly fibrotic changes with maybe some active inflammatory component still left (although the extent of active inflammation appears to be very little as compared to the initial scan from August 2022).   I discussed with him that we have very little in terms of treatments to offer him. I do not think the addition of an IL 5 inhibitor or dupilumab would be of any benefit in this situation given that his BAL eosinophil count in November was down to 0%  on steroids along with the fact that his current CT scan does not show much for active inflammation. Dr. Marchelle Gearing had discussed Cytoxan with him, but I think that that would be somewhat aggressive given that he is already got an active bone marrow problem with the myeloma and so I would discourage him from getting Cytoxan.  In terms of treatment options, we are really left with only 1 option which is to rechallenge him with high-dose steroids for the next 6-8 weeks and then reassess both his CT scan as well as pulmonary function studies. With this in mind, I have the following plan for him:  1. Increase oral prednisone from his 10 mg a day that he has been currently using for the past month or so to 60 mg a day. I have outlined to taper for him on the prescription and he will see me back on the 30th of May when he is down to 50 mg a day of prednisone.   2. Continue Bactrim PCP prophylaxis. I have refilled the prescription for him.   3. Continue oxygen supplementation  4. Await the recommendations of our myeloma colleagues in Hematology  5. We cannot refer him for lung transplantation given the active diagnosis of multiple myeloma. This was discussed with him.  Overall, I told him that we likely are dealing with end-stage interstitial lung disease with very little in terms of active inflammation and something that is reversible even with  the prednisone challenge I have outlined. He was given a prescription for Ofev by Dr. Marchelle Gearing, but could not tolerate that due to diarrhea, nausea as well as vomiting. He has an active prescription for Esbriet, but I told him to hold off on that for the next 8 weeks while he is undergoing the prednisone trial so that we do not have any issues with him throwing up on the prednisone and potentially confusing the assessment of treatment response.  If we do not see any improvement in his lung function on CT scan on May 30th, we will slowly taper and discontinue the prednisone and have him start the Esbriet.  With regards to multiple myeloma treatment going forward, I would definitely not challenge him with [name]. I am not so sure about the [name] and if that needs to be given to him in the future, we could potentially consider that under the cover of prednisone immunosuppression. This will need to be careful discussion between his Hematology and Pulmonary team given his severe interstitial lung disease in the lack of any wiggle room if he were to decline/have a flare of pneumonitis.  All questions were answered. The patient was satisfied with the visit. No learning barriers identified during the visit.  OV 12/04/2021  Subjective:  Patient ID: Samuel [name], male , DOB: 1956-01-29 , age 34 y.o. 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 Jeanie Sewer - 40mg  once weekly (for multiple myeloma chemotherapy N/V ppx)   04/09/21 - dexamethasone 4mg  tabs - 40 tabs for 28 day supply - Dr. Jeanie Sewer - 40mg  once weekly  (for multiple myeloma chemotherapy N/V ppx)   05/07/21 - dexamethasone 4mg  tabs - 40 tabs for 28 day supply - Dr. Jeanie Sewer - 40mg  once weekly  (for multiple myeloma chemotherapy N/V ppx)   06/06/21 - dexamethasone 4mg  tabs - 40 tabs for 28 day supply - Dr. Jeanie Sewer - decreased to 20mg  once weekly  06/18/21 - prednisone 10mg  tabs - 104 tabs for 34 day supply - Dr. Noralee Stain ------50mg  daily x 7 days, 40 mg daily x 7 days, 30 mg daily x 7 days, 20mg  daily x 7 days, 10mg  daily x 7 days, 5mg  daily x 7 days   07/13/21 - prednisone 10mg  tabs - 296 tab for 30 day supply - Dr. Marchelle Gearing ------50mg  daily x 14 days, 40 mg daily x 14 days, 30mg  daily x 14 days, 20mg  daily thereafter  - Mid Nov 2022  - 1gm solumedrol load x 3 days as outpatient  - 09/25/2021 - 20mg  pred per day \ -Late December 2022/early January 2023: Start nintedanib - 12/04/21 -prednisone 15mg  per day      12/04/2021 -   Chief Complaint  Patient presents with   Follow-up    Pt states he stopped taking the OFEV a week ago due to having problems with diarrhea, nausea, and some bleeding still. States since he stopped taking it, he has not had any diarrhea the past 2 days and states the bleeding stopped 2 days ago.     HPI Pearson Widmeyer Kral 67 y.o. -returns for follow-up with his wife.  He tells me that in terms of his multiple myeloma he has visited [facility] hematology department and since then is followed with Dr. Leonides Schanz.  He also saw Dr. Iran Sizer at [facility] pulmonary.  I reviewed Dr. Ross Ludwig notes from earlier this month 2023.  The general feeling that I get is that they are very nervous to do any form of chemotherapy  in him for fear of exacerbating his lung disease.  He is extremely worried about this approach.  There is also trepidation about Cytoxan.  He is really worried about what to do if his myeloma came back.  On the other hand he is also worried about his lungs.  He said he did talk to her known physician call Alm Bustard through Zoom meeting in [place] and he has recommended that patient use 10 L of oxygen with exercise.  He wants higher dose concentrator for this.  I was willing to prescribe this.  He is willing to even pay for this out-of-pocket.  He is also recommended sudden breathing exercises.  Patient is trying different breathing exercises with the hope his lungs can heal.  He is aware that he might be dealing with progressive issues.  He was on nintedanib for a month and early February 2023 he called saying he was having diarrhea and also bloody stools.  He has now stopped nintedanib and for the last week he has not had a diarrhea.  For the last few days there is no bloody stools.  He does not want to do this drug again.  The side effects were quite bad.  There is no further bleeding.  He also believes his hypertension  resolved or improved after stopping nintedanib.  He did have a colonoscopy 5 years ago and since then has not had any problems.  His next colonoscopy might be in another 5 years.  At this point in time he is able to do treadmill exercise on 5 L and walk 30 minutes 1.7 mph.  Nevertheless when we walked him on room air here in office he desaturated quickly.  It seems like his distance to desaturation is gotten worse.  He is worried about both the myeloma and the interstitial lung disease.  We discussed options about getting further opinions.  He says he wants to go to a place with his extreme expertise about this.  We discussed the idea about having to go out of state including [place], [place] [facility] and the [facility].  I do remember a name of Dr. Dia Sitter who is professor at [facility] who is an Database administrator in myeloma.  I did mention this name to him.  I have also written to him.  I also written to 1 Dr. Rozell Searing at the pulmonary department at the [facility].  Based on the response we will facilitate a referral.  Meanwhile I did tell him that we need to protect his lungs against fibrosis.  He wants to go down on his prednisone to 10 mg/day because of the side effects of weight gain.  I agreed to do this provisionally.  But I also recommended antifibrotic pirfenidone.  We discussed the side effects of nausea anorexia and occasionally diarrhea.  He wants to reflect on this.  He is willing to meet with the pharmacist on this.  I made a referral.   No results found.    PFT  OV 01/24/2022  Subjective:  Patient ID: Samuel [name], male , DOB: [birth year] , age 30 y.o. , MRN: [id number] , ADDRESS: [address] [place] [address] PCP Tally Joe, MD Patient Care Team: Tally Joe, MD as PCP - General (Family Medicine)  This Provider for this visit: Treatment Team:  Attending Provider: Kalman Shan, MD    01/24/2022 -   Chief Complaint  Patient presents with   Follow-up    PFT performed 12/10/21. Pt recently had a referral with [facility].  Pt states he has been doing okay since last visit   Drug-induced pneumonitis follow-up interstitial lung disease  HPI Talmage Horry Buddenhagen 67 y.o. -[facility].  Dr. Rozell Searing also called me last week.  Decided to go with high-dose prednisone because of some groundglass opacities.  Did not want to do concomitant pirfenidone because of potential side effect profile.  Patient has upcoming follow-up appointment Mar 09, 2022 at [facility] with Dr. Rozell Searing.  Also saw the myeloma specialist.  In case of recurrence some alternatives have been recommended.  He tells me that he is getting more optimistic.  He understands the significance of his disease in the severely but he is doing breathing exercises remaining  optimistic.  He is trying an organic diet.  And therefore his positive state of mind is making him feel better.  He does workout on a treadmill at 2.2 mph.  He covers 1.4 miles in 40 minutes.  He uses 8 L of oxygen for this.   Subjective symptom assessment score is documented below in the slightly better from last visit and significantly better compared to October 2022.  Walking desaturation test is improved from 2 months ago but is similar to follow-up 2022  PFT  OV 04/09/2022  Subjective:  Patient ID: Samuel [name], male , DOB: 01-15-1956 , age 56 y.o. , MRN: [id number] , ADDRESS: [address] [place] [address] PCP Tally Joe, MD Patient Care Team: Tally Joe, MD as PCP - General (Family Medicine)  This Provider for this visit: Treatment Team:  Attending Provider: Kalman Shan, MD    04/09/2022 -   Chief Complaint  Patient presents with   Follow-up    Pt states he has been doing okay since last visit but states he did get covid 6/6 and then after that he has had a cough.     HPI Seyed Zientek Desrochers 67 y.o. 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  He had a second visit to [facility].  He saw Dr. Renita Papa for pulmonary.  In terms of his pulmonary status things were deemed to be stable.  He had lung function studies.  His FVC is actually improved to 1.68 L on 03/19/2022 and his DLCO is stable around 10.3./Slightly improved.  Overall his pulmonary function test is stable/trending in the right direction.  He is lost significant amount of weight it is if his improvement could be because of that.  However he was feeling subjectively improved.  According to the [facility] notes pirfenidone is not being recommended because he is stable but he tells me that the decision was left up to him.  Certainly they want him to preserve his lung function and lose more weight.  Today wanted to talk about  pirfenidone but in the interim immediately after coming back March 27, 2022 he emailed me saying that he had COVID.  He believes he got it at [facility].  He has taken antiviral.  After this he is better but he still having some slightly worse dyspnea on exertion than baseline.  Some slightly more subjective use of oxygen at baseline [baseline exertional pulse ox stable/slightly worse].  More coughing and wheezing than baseline.  In terms of his prednisone therapy for his ILD and this is being tapered currently 7.5 mg/day.  [facility] Dr. Rozell Searing has on a tapering regimen to off but in the middle of this he had increased respiratory symptoms.  Wife is also reporting more sciatica as the prednisone is coming down.  In terms of his hematology he has been seen by [facility] multiple myeloma program and they made specific recommendations that I reviewed.  They are going to be in touch with his local hematologist oncologist Dr. Leonides Schanz but currently under remission.  Weight loss has been emphasized.  We discussed Ozempic for weight loss.      OV 06/06/2022  Subjective:  Patient ID: Samuel [name], male , DOB: 07-11-1956 , age 45 y.o. , MRN: [id number] , ADDRESS: [address] [place] [address] PCP Tally Joe, MD Patient Care Team: Tally Joe, MD as PCP - General (Family Medicine)  This Provider for this visit: Treatment Team:  Attending Provider: Kalman Shan, MD    06/06/2022 -  incent J Lemus 67 y.o. 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  Pt states that he has not been feeling well since getting Covid May 2023 after going to [facility].     HPI Lafayette Meech Weichert 67 y.o. -Returns for follow-up.  He presents with his wife Zella Ball. 2 weeks ago tapered off prednisone. Same time also  at request we started Pirfenidone he has now completed at 2 pills 3 times daily.   He is going to start 3 pills 3 times daily tomorrow.  He says that overall he is not feeling all that well.  His symptom scores of worsened.  He definitely feels more dyspneic.  He is also having worsening cough although his oxygen requirements at home are not any worse.  He is also feeling more tired.  He is having some back pain.  Had MRI and had intrathecal steroid and after that the back pain is better.  He is wondering if all the symptoms are related to coming off prednisone.  At the same time he also states his myeloma is recurring.  His urine M protein is spiking.  He is going to see Dr. Leonides Schanz in September 2023 and get started on the new regimen recommended by [facility] that is potentially less toxic to the lungs     His pulmonary function test is better than December 2022 seems worse than May 2023 when he was at [facility].?  Related to prednisone taper.  His walking desaturation test is stable.          12/04/2022 Follow up : ILD , drug-induced pneumonitis, chronic respiratory failure, asthma Patient returns for a 1 month follow-up.  Patient complains that he continues to have ongoing cough, congestion, nasal drainage, shortness of breath.  As above patient has underlying interstitial lung disease with drug-induced pneumonitis, felt secondary to medications used for treatment of his multiple myeloma.  Medications were stopped.  Patient was treated with a steroid challenge.  Patient did try Esbriet for brief time in fall 2023 but was unable to tolerate.  Patient also had COVID 19 infection in 2022 and 2023 and felt that his cough worsened after each episode. Patient's wife is also sick at home with an upper respiratory infection.  Has noticed that his cough is been worse over the last week.  Cough is very aggravating.  And affects his quality life.  Worse at night.  Patient does have a dog at home.  But says he is hypoallergenic.  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  Does not have a hot tub or basement.  No birds or chickens.  COVID-19 test and influenza test today in the office are negative.  Patient denies any hemoptysis, chest pain, orthopnea.  Does have some discolored nasal discharge as dark in the morning but clears as the day goes on.  He had similar symptoms last month and was treated with a 14-day course of antibiotics.  He was also treated in December with antibiotics and steroids.  Patient has been referred to ENT and consult is pending.  He is currently taking Advair twice daily.  Remains on Claritin daily.   OV 12/26/2022  Subjective:  Patient ID: Samuel [name], male , DOB: Nov 27, 1955 , age 29 y.o. , MRN: [id number] , ADDRESS: [address] [place] [address] PCP Tally Joe, MD Patient Care Team: Tally Joe, MD as PCP - General (Family Medicine)  This Provider for this visit: Treatment Team:  Attending Provider: Kalman Shan, MD    12/26/2022 -   Chief Complaint  Patient presents with   Follow-up    Fatigue, cough and chest congestion today.  Has had some improvement.  Esbriet caused dizziness, GI upset and lethargy.      Follow up : ILD , drug-induced pneumonitis, chronic respiratory failure, asthma Esbriet stopped in  October 2023 due to inability to tolerate due to GI side effects    # IgG Kappa Multiple Myeloma 02/22/2021: bone marrow biopsy confirms the diagnosis of Multiple Myeloma with a monoclonal plasma cell population.   # History of asthma.  Mid February 2020 for mildly elevated IgE and dust mite allergy.  On RAST allergy panel.  HPI Jaze Colantonio Rosales 67 y.o. -returns for follow-up.  Presents with his wife Zella Ball.  He has been having some cough and congestion.  Nurse practitioner Rikki Spearing in mid February 2024 did RAST allergy panel.  IgE is slightly high.  He has dust mite allergy.  He states he is known to have allergies.  He did have allergy shots for 10 years leading up until the recent illness  and then that was stopped.  His wife feels that the allergy shots did help him.  He has met with Dr. Miltonvale Callas and they are discussing whether to restart the allergy shots.  He wanted to know if there is any contraindication.  I felt that he should definitely try allergy shots or at least Xolair.  Although I was not sure whether he should undergo skin testing again.  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  He is able to play golf although very slowly.  Walking desaturation test today was stable very slowly.  He has not had a pulmonary function test and this is scheduled in a few weeks.  I offered to postpone this given his overall subjective symptoms stability and walking desaturation test ability but he wanted to keep it and come back and see me again within the next few to several weeks.  In terms of his myeloma reviewed Dr. Jeanie Sewer notes and his M protein is less than 0.5.  Therefore he is still on monitoring plan with plans to start [facility] protocol if myeloma were to relapse.  He is nervous about it because of the recent ILD.  We have agreed to discuss this again if the situation were to arise.     OV 01/20/2023  Subjective:  Patient ID: Samuel [name], male , DOB: 09-08-1956 , age 39 y.o. , MRN: [id number] , ADDRESS: [address] [place] [address] PCP Tally Joe, MD Patient Care Team: Tally Joe, MD as PCP - General (Family Medicine)  This Provider for this visit: Treatment Team:  Attending Provider: Kalman Shan, MD    01/20/2023 -   Chief Complaint  Patient presents with   Follow-up    F/up PFT     HPI Darci Current Deem 67 y.o. -as I got ready to see him I fell ill and declined to see him. Needs to be rescheduled.        OV 06/12/2023  Subjective:  Patient ID: Samuel [name], male , DOB: 24-Jun-1956 , age 67 y.o. , MRN: [id number] , ADDRESS: [address] [place] [address] PCP  Tally Joe, MD Patient Care Team: Tally Joe, MD as PCP - General (Family Medicine)  This Provider for this visit: Treatment Team:  Attending Provider: Kalman Shan, MD   Follow up : ILD , drug-induced pneumonitis, chronic respiratory failure, asthma Esbriet stopped in October 2023 due to inability to tolerate due to GI side effects    # IgG Kappa Multiple Myeloma 02/22/2021: bone marrow biopsy confirms the diagnosis of Multiple Myeloma with a monoclonal plasma cell population.   # History of asthma.  Mid February  2020 for mildly elevated IgE and dust mite allergy.  On RAST allergy panel.  06/12/2023 -   Chief Complaint  Patient presents with   Follow-up    F/up on PFT    HPI JERMONE CABINESS 67 y.o. -presents for follow-up.  His last visit with me was in March 2024.  And in the April visit had to be canceled because I was sick.  Since then he has seen Dr. Leonides Schanz his myeloma specialist.  At that visit he reported that he has got a new diagnosis of acid reflux and laryngoscopy showed inflammation.  He still reported that he was using 2 to 3 L oxygen at rest and at home with 10 L at exercise.  The M protein itself was deemed stable and continued monitoring decision was taken through a shared decision-making process.  Decision to start treatment was held off.  He also saw speech-language pathologist on 05/26/2023 in terms of his shortness of breath he feels stable.  In fact his subjective symptom scores are stable.  He says that when he does bench press he does not desaturate but for treadmill he will have to use a lot of oxygen.  However pulmonary function test shows a significant decline 12% compared to the recent 1.  He says he is not feeling it.  He says in fact he did a good cooperative PFT.  His sit/stand hypoxemia exercise test was also adequate.  In February 2024 he did not have any pulmonary hypertension.  His last CT scan was in February 2024 of the chest.  There is no lung  cancer.  We discussed about getting BNP and echocardiogram as a screening for pulmonary hypertension and he is interested in this.  The intent would be to treat with inhaled treprostinil if he has pulmonary hypertension.  Meanwhile his M protein levels are higher 0.8.  Therapy is being considered.  I reviewed the intended immunomodulator therapy.  There is no report of any ILD but there is increase of shortness of breath and respiratory infections.   SYMPTOM SCALE - ILD 08/09/2021 09/25/2021 218# 12/04/2021 222# 01/24/22 222# 04/09/2022 215# 06/06/2022 211# 06/12/2023 205#  Current weight    Treadmill 2.2 mph.  As 1.4 miles over 40 minutes.  Uses 8 L oxygen Post may 2nd visit 03/27/22 -2ndcvoid    O2 use ra ra ra ra 2L eert 2L exert O2 with exertion  Shortness of Breath 0 -> 5 scale with 5 being worst (score 6 If unable to do)        At rest 0 0 0 0 0 4 0.5  Simple tasks - showers, clothes change, eating, shaving 2 2 1  0/5 0 3 2  Household (dishes, doing bed, laundry) x na 1 1 1 3 3   Shopping 3 1 1.5 0/5 1 3  0.5  Walking level at own pace 4 2 2 1 1 2  2.5  Walking up Stairs 5 4 3  3.5 3 2 5   Total (30-36) Dyspnea Score 14 9 8.5 6 6 17  14.5  How bad is your cough? 3 1 0.5  3.4 5 Only iwht acid reflux  How bad is your fatigue 0 2 0  3    How bad is nausea 0 0 0  0 5 0  How bad is vomiting?  0 0 0  0 0 0  How bad is diarrhea? 0 0 0  0 4 0  How bad is anxiety? 5 2 1   0.5 0 6  How bad is depression 2 1 1   0.5 0 2  Any chronic pain - if so where and how bad x x x  x  x     Simple office walk 185 feet x  3 laps goal with forehead probe 07/12/2021  08/09/2021  09/25/2021  12/04/2021  01/24/2022  04/09/2022  06/06/2022  12/26/2022  06/12/2023   O2 used ra Ra3 ra ra ra ra ra ra ra  Number laps completed 3 3 but did oly 2 3 3  attempted but stopped at 2 due to deats All 3 las Stopped at [employer] all 3 Stopped at 2   Comments about pace slow slow slow slow Avg pace Avg  slow slow   Resting  Pulse Ox/HR 100% and 69/min 100%ad 74 98% and 75 99% RA and59 98% and HR 60 98% and HR 54 98% nand HR 90 98% n HR 73   Final Pulse Ox/HR 92% and 81/min 93% and 87 91% and 91 86% RA and 85 91% and HR 80 (brief 89%) 88% and HR 80 91% and HR 107 90% an HR 79   Desaturated </= 88% no no no yes ni  no    Desaturated <= 3% points Yes 8 Yes, 7 pponts Yes, 7 points Yes, 13 poins Yes 7 points Yes, 10 points Yes 7 poin    Got Tachycardic >/= 90/min no no yes no no  uyes    Symptoms at end of test No complaints Mod-severe dyspnea Mild dyspnea No complaints none Seemed ok Mild dyspnea    Miscellaneous comments x Worse? stable ? worse          PFT     Latest Ref Rng & Units 06/12/2023   11:30 AM 01/17/2023    2:09 PM 09/26/2022   10:34 AM 08/01/2022    1:57 PM 06/06/2022    1:52 PM 12/10/2021    9:24 AM 10/16/2021    3:56 PM  ILD indicators  FVC-Pre L 1.19  P 1.36  1.32  1.55  1.43  1.49  1.35   FVC-Predicted Pre % 26  P 29  28  33  30  32  29   DLCO uncorrected ml/min/mmHg 9.36  P 12.92  9.61  10.43  13.19  8.64  9.99   DLCO UNC %Pred % 35  P 48  36  39  49  32  37   DLCO Corrected ml/min/mmHg 9.36  P 13.27  9.96  10.58  13.77  9.20  10.04   DLCO COR %Pred % 35  P 49  37  39  51  34  37     P Preliminary result      LAB RESULTS last 96 hours No results found.  LAB RESULTS last 90 days Recent Results (from the past 2160 hour(s))  Kappa/lambda light chains     Status: Abnormal   Collection Time: 03/24/23  9:00 AM  Result Value Ref Range   Kappa free light chain 35.9 (H) 3.3 - 19.4 mg/L   Lambda free light chains 16.6 5.7 - 26.3 mg/L   Kappa, lambda light chain ratio 2.16 (H) 0.26 - 1.65    Comment: (NOTE) Performed At: [facility] [address] [place], [place] [id number] Jolene Schimke MD YQ:[id number]   CMP (Cancer Center only)     Status: Abnormal   Collection Time: 03/24/23  9:00 AM  Result Value Ref Range   Sodium 130 (L) 135 - 145 mmol/L  Potassium 4.0 3.5 - 5.1  mmol/L   Chloride 96 (L) 98 - 111 mmol/L   CO2 28 22 - 32 mmol/L   Glucose, Bld 109 (H) 70 - 99 mg/dL    Comment: Glucose reference range applies only to samples taken after fasting for at least 8 hours.   BUN 17 8 - 23 mg/dL   Creatinine 1.61 0.96 - 1.24 mg/dL   Calcium 9.5 8.9 - 04.5 mg/dL   Total Protein 8.2 (H) 6.5 - 8.1 g/dL   Albumin 4.2 3.5 - 5.0 g/dL   AST 24 15 - 41 U/L   ALT 15 0 - 44 U/L   Alkaline Phosphatase 64 38 - 126 U/L   Total Bilirubin 0.4 0.3 - 1.2 mg/dL   GFR, Estimated >40 >98 mL/min    Comment: (NOTE) Calculated using the CKD-EPI Creatinine Equation (2021)    Anion gap 6 5 - 15    Comment: Performed at [facility], [address], [place], [place] [address]  CBC with Differential (Cancer Center Only)     Status: None   Collection Time: 03/24/23  9:00 AM  Result Value Ref Range   WBC Count 5.9 4.0 - 10.5 K/uL   RBC 4.86 4.22 - 5.81 MIL/uL   Hemoglobin 14.2 13.0 - 17.0 g/dL   HCT 78.2 95.6 - 21.3 %   MCV 86.6 80.0 - 100.0 fL   MCH 29.2 26.0 - 34.0 pg   MCHC 33.7 30.0 - 36.0 g/dL   RDW 08.6 57.8 - 46.9 %   Platelet Count 230 150 - 400 K/uL   nRBC 0.0 0.0 - 0.2 %   Neutrophils Relative % 73 %   Neutro Abs 4.3 1.7 - 7.7 K/uL   Lymphocytes Relative 19 %   Lymphs Abs 1.1 0.7 - 4.0 K/uL   Monocytes Relative 6 %   Monocytes Absolute 0.4 0.1 - 1.0 K/uL   Eosinophils Relative 2 %   Eosinophils Absolute 0.1 0.0 - 0.5 K/uL   Basophils Relative 0 %   Basophils Absolute 0.0 0.0 - 0.1 K/uL   Immature Granulocytes 0 %   Abs Immature Granulocytes 0.01 0.00 - 0.07 K/uL    Comment: Performed at [facility], [address], [place], [place] [address]  Multiple Myeloma Panel (SPEP&IFE w/QIG)     Status: Abnormal   Collection Time: 03/24/23  9:01 AM  Result Value Ref Range   IgG (Immunoglobin G), Serum 1,791 (H) 603 - 1,613 mg/dL   IgA 841 61 - 324 mg/dL   IgM (Immunoglobulin M), Srm 89 20 - 172 mg/dL   Total  Protein ELP 7.1 6.0 - 8.5 g/dL   Albumin SerPl Elph-Mcnc 3.4 2.9 - 4.4 g/dL   Alpha 1 0.3 0.0 - 0.4 g/dL   Alpha2 Glob SerPl Elph-Mcnc 0.8 0.4 - 1.0 g/dL   B-Globulin SerPl Elph-Mcnc 1.0 0.7 - 1.3 g/dL   Gamma Glob SerPl Elph-Mcnc 1.6 0.4 - 1.8 g/dL   M Protein SerPl Elph-Mcnc 0.5 (H) Not Observed g/dL   Globulin, Total 3.7 2.2 - 3.9 g/dL   Albumin/Glob SerPl 1.0 0.7 - 1.7   IFE 1 Comment (A)     Comment: (NOTE) Immunofixation shows IgG monoclonal protein with kappa light chain specificity. PLEASE NOTE: Samples from patients receiving DARZALEX(R) (daratumumab) or SARCLISA(R)(isatuximab-irfc) treatment can appear as an "IgG kappa" and mask a complete response (CR). If this patient is receiving these therapies, this IFE assay interference can be removed by ordering  test number [id number]-"Immunofixation, Daratumumab-Specific, Serum" or 123062-"Immunofixation, Isatuximab-Specific, Serum" and submitting a new sample for testing or by calling the lab to add this test to the current sample.    Please Note Comment     Comment: (NOTE) Protein electrophoresis scan will follow via computer, mail, or courier delivery. Performed At: [facility] [address] [place], [place] [id number] Jolene Schimke MD [id number]   Lactate dehydrogenase (LDH)     Status: Abnormal   Collection Time: 03/24/23  9:01 AM  Result Value Ref Range   LDH 258 (H) 98 - 192 U/L    Comment: Performed at [facility], [address] [place], [place] [address]  Kappa/lambda light chains     Status: Abnormal   Collection Time: 04/21/23  9:33 AM  Result Value Ref Range   Kappa free light chain 42.7 (H) 3.3 - 19.4 mg/L   Lambda free light chains 17.8 5.7 - 26.3 mg/L   Kappa, lambda light chain ratio 2.40 (H) 0.26 - 1.65    Comment: (NOTE) Performed At: [facility] [address] [place], [place] [id number] Jolene Schimke MD FT:[id number]   Multiple Myeloma Panel  (SPEP&IFE w/QIG)     Status: Abnormal   Collection Time: 04/21/23  9:33 AM  Result Value Ref Range   IgG (Immunoglobin G), Serum 1,753 (H) 603 - 1,613 mg/dL   IgA 062 61 - 376 mg/dL   IgM (Immunoglobulin M), Srm 87 20 - 172 mg/dL   Total Protein ELP 7.0 6.0 - 8.5 g/dL   Albumin SerPl Elph-Mcnc 3.5 2.9 - 4.4 g/dL   Alpha 1 0.3 0.0 - 0.4 g/dL   Alpha2 Glob SerPl Elph-Mcnc 0.7 0.4 - 1.0 g/dL   B-Globulin SerPl Elph-Mcnc 0.9 0.7 - 1.3 g/dL   Gamma Glob SerPl Elph-Mcnc 1.6 0.4 - 1.8 g/dL   M Protein SerPl Elph-Mcnc 0.5 (H) Not Observed g/dL   Globulin, Total 3.5 2.2 - 3.9 g/dL   Albumin/Glob SerPl 1.1 0.7 - 1.7   IFE 1 Comment (A)     Comment: (NOTE) Immunofixation shows IgG monoclonal protein with kappa light chain specificity. PLEASE NOTE: Samples from patients receiving DARZALEX(R) (daratumumab) or SARCLISA(R)(isatuximab-irfc) treatment can appear as an "IgG kappa" and mask a complete response (CR). If this patient is receiving these therapies, this IFE assay interference can be removed by ordering test number [id number]-"Immunofixation, Daratumumab-Specific, Serum" or 123062-"Immunofixation, Isatuximab-Specific, Serum" and submitting a new sample for testing or by calling the lab to add this test to the current sample. Polyclonal increase detected in one or more immunoglobulins.    Please Note Comment     Comment: (NOTE) Protein electrophoresis scan will follow via computer, mail, or courier delivery. Performed At: [facility] [address] [place], [place] [id number] Jolene Schimke MD YW:[id number]   Lactate dehydrogenase (LDH)     Status: Abnormal   Collection Time: 04/21/23  9:33 AM  Result Value Ref Range   LDH 250 (H) 98 - 192 U/L    Comment: Performed at [facility], [address] [place], [place] [address]  CMP (Cancer Center only)     Status: Abnormal   Collection Time: 04/21/23  9:33 AM  Result Value Ref Range   Sodium 130 (L)  135 - 145 mmol/L   Potassium 4.5 3.5 - 5.1 mmol/L   Chloride 96 (L) 98 - 111 mmol/L   CO2 30 22 - 32 mmol/L   Glucose, Bld 71 70 - 99 mg/dL  Comment: Glucose reference range applies only to samples taken after fasting for at least 8 hours.   BUN 11 8 - 23 mg/dL   Creatinine 9.62 9.52 - 1.24 mg/dL   Calcium 8.7 (L) 8.9 - 10.3 mg/dL   Total Protein 7.6 6.5 - 8.1 g/dL   Albumin 3.7 3.5 - 5.0 g/dL   AST 32 15 - 41 U/L   ALT 30 0 - 44 U/L   Alkaline Phosphatase 68 38 - 126 U/L   Total Bilirubin 0.3 0.3 - 1.2 mg/dL   GFR, Estimated >84 >13 mL/min    Comment: (NOTE) Calculated using the CKD-EPI Creatinine Equation (2021)    Anion gap 4 (L) 5 - 15    Comment: Performed at [facility], [address] [place], [place] [address]  CBC with Differential (Cancer Center Only)     Status: None   Collection Time: 04/21/23  9:33 AM  Result Value Ref Range   WBC Count 6.0 4.0 - 10.5 K/uL   RBC 4.63 4.22 - 5.81 MIL/uL   Hemoglobin 13.6 13.0 - 17.0 g/dL   HCT 02.7 25.3 - 66.4 %   MCV 88.8 80.0 - 100.0 fL   MCH 29.4 26.0 - 34.0 pg   MCHC 33.1 30.0 - 36.0 g/dL   RDW 40.3 47.4 - 25.9 %   Platelet Count 214 150 - 400 K/uL   nRBC 0.0 0.0 - 0.2 %   Neutrophils Relative % 61 %   Neutro Abs 3.7 1.7 - 7.7 K/uL   Lymphocytes Relative 22 %   Lymphs Abs 1.3 0.7 - 4.0 K/uL   Monocytes Relative 9 %   Monocytes Absolute 0.5 0.1 - 1.0 K/uL   Eosinophils Relative 7 %   Eosinophils Absolute 0.4 0.0 - 0.5 K/uL   Basophils Relative 1 %   Basophils Absolute 0.0 0.0 - 0.1 K/uL   Immature Granulocytes 0 %   Abs Immature Granulocytes 0.01 0.00 - 0.07 K/uL    Comment: Performed at [facility], [address], [place], [place] [address]  Kappa/lambda light chains     Status: Abnormal   Collection Time: 05/19/23  9:20 AM  Result Value Ref Range   Kappa free light chain 35.8 (H) 3.3 - 19.4 mg/L   Lambda free light chains 19.1 5.7 - 26.3 mg/L   Kappa, lambda  light chain ratio 1.87 (H) 0.26 - 1.65    Comment: (NOTE) Performed At: [facility] [address] [place], [place] [id number] Jolene Schimke MD OA:[id number]   Multiple Myeloma Panel (SPEP&IFE w/QIG)     Status: Abnormal   Collection Time: 05/19/23  9:20 AM  Result Value Ref Range   IgG (Immunoglobin G), Serum 2,165 (H) 603 - 1,613 mg/dL   IgA 010 61 - 932 mg/dL   IgM (Immunoglobulin M), Srm 106 20 - 172 mg/dL   Total Protein ELP 7.8 6.0 - 8.5 g/dL   Albumin SerPl Elph-Mcnc 3.7 2.9 - 4.4 g/dL   Alpha 1 0.3 0.0 - 0.4 g/dL   Alpha2 Glob SerPl Elph-Mcnc 0.8 0.4 - 1.0 g/dL   B-Globulin SerPl Elph-Mcnc 1.1 0.7 - 1.3 g/dL   Gamma Glob SerPl Elph-Mcnc 1.9 (H) 0.4 - 1.8 g/dL   M Protein SerPl Elph-Mcnc 0.8 (H) Not Observed g/dL   Globulin, Total 4.1 (H) 2.2 - 3.9 g/dL   Albumin/Glob SerPl 1.0 0.7 - 1.7   IFE 1 Comment (A)     Comment: (NOTE) Immunofixation shows IgG monoclonal  protein with kappa light chain specificity. PLEASE NOTE: Samples from patients receiving DARZALEX(R) (daratumumab) or SARCLISA(R)(isatuximab-irfc) treatment can appear as an "IgG kappa" and mask a complete response (CR). If this patient is receiving these therapies, this IFE assay interference can be removed by ordering test number [id number]-"Immunofixation, Daratumumab-Specific, Serum" or [id number]-"Immunofixation, Isatuximab-Specific, Serum" and submitting a new sample for testing or by calling the lab to add this test to the current sample.    Please Note Comment     Comment: (NOTE) Protein electrophoresis scan will follow via computer, mail, or courier delivery. Performed At: [facility] [address] [place], [place] [id number] Jolene Schimke MD XB:[id number]   Lactate dehydrogenase (LDH)     Status: Abnormal   Collection Time: 05/19/23  9:20 AM  Result Value Ref Range   LDH 261 (H) 98 - 192 U/L    Comment: Performed at [facility], [address]  [place], [place] [address]  CMP (Cancer Center only)     Status: Abnormal   Collection Time: 05/19/23  9:20 AM  Result Value Ref Range   Sodium 128 (L) 135 - 145 mmol/L   Potassium 4.3 3.5 - 5.1 mmol/L   Chloride 94 (L) 98 - 111 mmol/L   CO2 29 22 - 32 mmol/L   Glucose, Bld 80 70 - 99 mg/dL    Comment: Glucose reference range applies only to samples taken after fasting for at least 8 hours.   BUN 14 8 - 23 mg/dL   Creatinine 3.66 4.40 - 1.24 mg/dL   Calcium 9.7 8.9 - 34.7 mg/dL   Total Protein 8.1 6.5 - 8.1 g/dL   Albumin 4.3 3.5 - 5.0 g/dL   AST 29 15 - 41 U/L   ALT 21 0 - 44 U/L   Alkaline Phosphatase 79 38 - 126 U/L   Total Bilirubin 0.2 (L) 0.3 - 1.2 mg/dL   GFR, Estimated >42 >59 mL/min    Comment: (NOTE) Calculated using the CKD-EPI Creatinine Equation (2021)    Anion gap 5 5 - 15    Comment: Performed at [facility], [address] [place], [place] [address]  CBC with Differential (Cancer Center Only)     Status: None   Collection Time: 05/19/23  9:20 AM  Result Value Ref Range   WBC Count 7.3 4.0 - 10.5 K/uL   RBC 4.93 4.22 - 5.81 MIL/uL   Hemoglobin 14.9 13.0 - 17.0 g/dL   HCT 56.4 33.2 - 95.1 %   MCV 86.4 80.0 - 100.0 fL   MCH 30.2 26.0 - 34.0 pg   MCHC 35.0 30.0 - 36.0 g/dL   RDW 88.4 16.6 - 06.3 %   Platelet Count 264 150 - 400 K/uL   nRBC 0.0 0.0 - 0.2 %   Neutrophils Relative % 71 %   Neutro Abs 5.2 1.7 - 7.7 K/uL   Lymphocytes Relative 18 %   Lymphs Abs 1.4 0.7 - 4.0 K/uL   Monocytes Relative 8 %   Monocytes Absolute 0.6 0.1 - 1.0 K/uL   Eosinophils Relative 2 %   Eosinophils Absolute 0.2 0.0 - 0.5 K/uL   Basophils Relative 1 %   Basophils Absolute 0.0 0.0 - 0.1 K/uL   Immature Granulocytes 0 %   Abs Immature Granulocytes 0.01 0.00 - 0.07 K/uL    Comment: Performed at [facility], [address] [place], [place] [address]  Pulmonary Function Test     Status: None (  Preliminary result)   Collection Time:  06/12/23 11:30 AM  Result Value Ref Range   FVC-Pre 1.19 L   FVC-%Pred-Pre 26 %   FEV1-Pre 1.05 L   FEV1-%Pred-Pre 30 %   FEV6-Pre 1.18 L   FEV6-%Pred-Pre 27 %   Pre FEV1/FVC ratio 88 %   FEV1FVC-%Pred-Pre 118 %   Pre FEV6/FVC Ratio 100 %   FEV6FVC-%Pred-Pre 105 %   FEF 25-75 Pre 1.48 L/sec   FEF2575-%Pred-Pre 55 %   DLCO unc 9.36 ml/min/mmHg   DLCO unc % pred 35 %   DLCO cor 9.36 ml/min/mmHg   DLCO cor % pred 35 %   DL/VA 2.95 ml/min/mmHg/L   DL/VA % pred 621 %         has a past medical history of Asthma, GERD (gastroesophageal reflux disease), High cholesterol, History of blood transfusion, Hypothyroidism, Interstitial lung disease (HCC), Multiple myeloma (HCC), Perennial allergic rhinitis, Pneumonia, Sciatic pain, right, Seasonal allergic rhinitis, and Thyroid disease.   reports that he quit smoking about 40 years ago. His smoking use included cigarettes. He started smoking about 43 years ago. He has a 0.3 pack-year smoking history. He has never used smokeless tobacco.  Past Surgical History:  Procedure Laterality Date   BRONCHIAL BIOPSY  06/18/2021   Procedure: BRONCHIAL BIOPSIES;  Surgeon: Leslye Peer, MD;  Location: [facility];  Service: Cardiopulmonary;;   BRONCHIAL BIOPSY  08/28/2021   Procedure: BRONCHIAL BIOPSIES;  Surgeon: Josephine Igo, DO;  Location: [facility];  Service: Cardiopulmonary;;   BRONCHIAL WASHINGS  06/18/2021   Procedure: BRONCHIAL WASHINGS;  Surgeon: Leslye Peer, MD;  Location: [facility];  Service: Cardiopulmonary;;   BRONCHIAL WASHINGS  08/28/2021   Procedure: BRONCHIAL WASHINGS;  Surgeon: Josephine Igo, DO;  Location: [facility];  Service: Cardiopulmonary;;   NASAL SINUS SURGERY     NECK SURGERY  1996   VIDEO BRONCHOSCOPY N/A 06/18/2021   Procedure: VIDEO BRONCHOSCOPY WITH FLUORO;  Surgeon: Leslye Peer, MD;  Location: [facility];  Service: Cardiopulmonary;  Laterality: N/A;   VIDEO BRONCHOSCOPY N/A 08/28/2021    Procedure: VIDEO BRONCHOSCOPY WITH FLUORO;  Surgeon: Josephine Igo, DO;  Location: [facility];  Service: Cardiopulmonary;  Laterality: N/A;    Allergies  Allergen Reactions   Clarithromycin Other (See Comments)    Hiccups   Ofev [Nintedanib] Other (See Comments)    GI bleeding   Penicillins Other (See Comments)    Child hood unsure    Immunization History  Administered Date(s) Administered   Fluad Quad(high Dose 65+) 08/09/2021, 08/01/2022   Influenza Split 10/27/2017   Influenza, High Dose Seasonal PF 12/01/2017, 11/29/2019, 12/19/2020, 12/24/2021   Influenza, Quadrivalent, Recombinant, Inj, Pf 07/28/2019, 08/11/2020   Influenza-Unspecified 11/26/2011, 07/21/2017   PFIZER(Purple Top)SARS-COV-2 Vaccination 12/27/2019, 01/24/2020, 09/21/2020   Tdap 08/20/2005, 09/07/2015   Zoster, Live 11/06/2017, 05/07/2018    Family History  Problem Relation Age of Onset   Hypertension Mother    Allergies Mother    Allergies Father    CAD Father 46   Breast cancer Paternal Grandmother    Hypertension Other    Heart disease Other      Current Outpatient Medications:    acyclovir (ZOVIRAX) 400 MG tablet, Take 1 tablet (400 mg total) by mouth 2 (two) times daily., Disp: 60 tablet, Rfl: 3   albuterol (PROVENTIL) (2.5 MG/3ML) 0.083% nebulizer solution, Take 3 mLs (2.5 mg total) by nebulization every 6 (six) hours as needed for wheezing or shortness of breath., Disp: 120 mL, Rfl:  12   albuterol (VENTOLIN HFA) 108 (90 Base) MCG/ACT inhaler, Inhale 2 puffs into the lungs every 6 hours as needed for wheezing or shortness of breath., Disp: 8.5 g, Rfl: 1   ALPRAZolam (XANAX) 0.5 MG tablet, Take 0.5 mg by mouth at bedtime., Disp: , Rfl:    ASSESS FULL RANGE PEAK METER DEVI, as directed., Disp: , Rfl:    azelastine (ASTELIN) 0.1 % nasal spray, Place 2 sprays into both nostrils 2 (two) times daily. Use in each nostril as directed, Disp: 30 mL, Rfl: 5   bisoprolol (ZEBETA) 5 MG tablet, Take 1  tablet (5 mg total) by mouth daily., Disp: 30 tablet, Rfl: 2   Budeson-Glycopyrrol-Formoterol (BREZTRI AEROSPHERE) 160-9-4.8 MCG/ACT AERO, Inhale 2 puffs into the lungs in the morning and at bedtime., Disp: 10.7 g, Rfl: 5   cholecalciferol (VITAMIN D) 1000 UNITS tablet, Take 3,000 Units by mouth daily., Disp: , Rfl:    Dextromethorphan HBr (DELSYM PO), Take by mouth as needed. For cough, Disp: , Rfl:    dextromethorphan-guaiFENesin (MUCINEX DM) 30-600 MG 12hr tablet, Take 1 tablet by mouth 2 (two) times daily., Disp: , Rfl:    EPINEPHrine (EPI-PEN) 0.3 mg/0.3 mL DEVI, Inject 0.3 mg into the muscle as needed., Disp: , Rfl:    fluticasone (FLONASE) 50 MCG/ACT nasal spray, 1 spray., Disp: , Rfl:    furosemide (LASIX) 20 MG tablet, TAKE ONE TABLET BY MOUTH AS NEEDED FOR FLUID OR EDEMA, Disp: 14 tablet, Rfl: 1   ketoconazole (NIZORAL) 2 % cream, SMARTSIG:1 Application Topical 1 to 2 Times Daily, Disp: , Rfl:    loratadine (CLARITIN) 10 MG tablet, Take 10 mg by mouth daily., Disp: , Rfl:    montelukast (SINGULAIR) 10 MG tablet, Take 10 mg by mouth at bedtime., Disp: , Rfl:    Multiple Vitamin (MULTIVITAMIN) tablet, Take 1 tablet by mouth daily., Disp: , Rfl:    omeprazole (PRILOSEC) 40 MG capsule, Take 1 capsule (40 mg total) by mouth 2 (two) times daily. Take 30 minutes before a meal, Disp: 60 capsule, Rfl: 3   OXYGEN, Inhale 2 L into the lungs continuous. As needed, Disp: , Rfl:    PE-diphenhydrAMINE-DM-GG-APAP (DELSYM DAY NIGHT PO), Take by mouth in the morning and at bedtime., Disp: , Rfl:    rosuvastatin (CRESTOR) 20 MG tablet, Take 1 tablet (20 mg total) by mouth daily., Disp: 30 tablet, Rfl: 3   sildenafil (REVATIO) 20 MG tablet, Take 20 mg by mouth daily as needed (ED)., Disp: , Rfl:    Simethicone (SIMETHICONE ULTRA STRENGTH) 180 MG CAPS, Take 1 capsule (180 mg total) by mouth 3 (three) times daily as needed., Disp: 90 capsule, Rfl: 0   sodium chloride (OCEAN) 0.65 % SOLN nasal spray, Place 1  spray into both nostrils as needed for congestion., Disp: , Rfl:    tamsulosin (FLOMAX) 0.4 MG CAPS capsule, Take 0.4 mg by mouth daily., Disp: , Rfl:    thyroid (ARMOUR) 120 MG tablet, Take 120 mg by mouth daily before breakfast., Disp: , Rfl:    triamcinolone cream (KENALOG) 0.1 %, Apply 1 application topically daily as needed (sun burn itch)., Disp: , Rfl:       Objective:   Vitals:   06/12/23 1340  BP: (!) 140/100  Pulse: 68  SpO2: 97%  Weight: 205 lb 12.8 oz (93.4 kg)  Height: 5\' 10"  (1.778 m)    Estimated body mass index is 29.53 kg/m as calculated from the following:   Height as of  this encounter: 5\' 10"  (1.778 m).   Weight as of this encounter: 205 lb 12.8 oz (93.4 kg).  @WEIGHTCHANGE @  [employer]   06/12/23 1340  Weight: 205 lb 12.8 oz (93.4 kg)     Physical Exam   General: No distress. Looks well O2 at rest: no Cane present: no Sitting in wheel chair: no Frail: no Obese: no Neuro: Alert and Oriented x 3. GCS 15. Speech normal Psych: Pleasant Resp:  Barrel Chest - no.  Wheeze - no, Crackles - minimal some, No overt respiratory distress CVS: Normal heart sounds. Murmurs - no Ext: Stigmata of Connective Tissue Disease - no HEENT: Normal upper airway. PEERL +. No post nasal drip        Assessment:       ICD-10-CM   1. ILD (interstitial lung disease) (HCC)  J84.9     2. Dyspnea on exertion  R06.09          Plan:     Patient Instructions  ILD (interstitial lung disease) (HCC) Drug-induced pneumonitis  Currently subjectively stable and with decent sit stand performance PFT shows worsening    Plan  - o2 for goal pulse ox > 88% - get echo next few weeks - get blood BNP 06/12/2023 - will call with these results; based on these test results consider Right heart cath  - spiro and dlco in 16 weeks  Cough and congestion/ASthma/SEasonal allergies  - active at the moment  - RAST panel positive for mild IgE elevation and dust mite  allergy   Plan  -per Dr [place] Callas  Obesity (BMI 30.0-34.9)  - Congratulations on ongoing weight loss that is intentional - Weight  209# on 06/12/2023   Plan -continue weight l;oss  - talk to PCP Tally Joe, MD about weight loss drugs  Myeloma    M protein < 0.8 in July 2024 and  on monitoring plan with consideration of Daratumumab   Plan  - per [facility] and Dr Elise Benne 16 weeks after completing PFT; can consider feno at followup   FOLLOWUP Return in about 16 weeks (around 10/02/2023) for 30 min visit, ILD, Face to Face Visit.   ( Level 05 visit E&M 2024: Estb >= 40 min   visit type: on-site physical face to visit  in total care time and counseling or/and coordination of care by this undersigned MD - Dr Kalman Shan. This includes one or more of the following on this same day 06/12/2023: pre-charting, chart review, note writing, documentation discussion of test results, diagnostic or treatment recommendations, prognosis, risks and benefits of management options, instructions, education, compliance or risk-factor reduction. It excludes time spent by the CMA or office staff in the care of the patient. Actual time 40 min)    SIGNATURE    Dr. Kalman Shan, M.D., F.C.C.P,  Pulmonary and Critical Care Medicine Staff Physician, [facility] Director - Interstitial Lung Disease  Program  Pulmonary Fibrosis [facility] [place], [place], [address]  Pager: [phone], If no answer or between  15:00h - 7:00h: call [phone] Telephone: [phone]  2:23 PM 06/12/2023

## 2023-06-13 LAB — BRAIN NATRIURETIC PEPTIDE: Pro B Natriuretic peptide (BNP): 21 pg/mL (ref 0.0–100.0)

## 2023-06-15 NOTE — Progress Notes (Signed)
Samuel [name] [phone]   [phone]  ONCOLOGY PROGRESS NOTE  Patient Care Team: Tally Joe, MD as PCP - General (Family Medicine)  Hematological/Oncological History # IgG Kappa Multiple Myeloma 02/02/2021:  Presented to [facility] due to right sided flank tenderness with bruising. CT abdomen/pelvis: Multiple small lytic lesions in the thoracolumbar spine and bilateral pelvis -SPEP: IgG 2,082 (H), M Protein 1.8 (H). IFE shows IgG monoclonal protein with kappa light chain specificity.  -LDH 169, CBC normal, CMP normal except for sodium 131 (L), Chloride 96 (L).   02/14/2021: Establish care with Samuel Kaufmann PA-C 02/22/2021: bone marrow biopsy confirms the diagnosis of Multiple Myeloma with a monoclonal plasma cell population.  03/16/2021: Cycle 1 Day 1 of VRd chemotherapy  04/06/2021: Cycle 2 Day 1 of VRd chemotherapy  04/27/2021: Cycle 3 Day 1 of VRd chemotherapy  05/18/2021: Cycle 4 Day 1 of VRd chemotherapy  06/01/2021: drop dexamethasone to 20mg  PO weekly and start lasix due to shortness of breath.  06/15/2021: Desaturation to 87% on ambulation. HELD velcade today and sent to ED for evaluation.  06/22/2021: Findings consistent with drug reaction the lungs with eosinophils on BAL.  Given these findings we will definitely hold [name] and plan to avoid pomalidomide 06/29/2021: Cycle 1 Day 1 CyBorD chemotherapy 07/05/2021: HOLD chemotherapy given worsening lung function  Interval History:  Samuel [name] 67 y.o. male with medical history significant for IgG Kappa multiple myeloma who presents for a follow up visit. The patient's last visit was on 04/21/2023. He is accompanied by his wife for this visit.  Samuel [name] reports in the interim since her last visit he unfortunately had another COVID infection.  He reports that he also is being treated for GERD which is thought to be a partial cause of his cough.  He reports that he continues to have a lot of shortness  of breath and still coughing but overall he is "been okay".  He is still doing his best to try to exercise and uses oxygen when he does.  He notes that he is golfing but unfortunately is not able to complete a full course.  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  He notes that his weight continues to drop as he is eating less and trying to make dietary changes.  He notes he is also seeing an acupuncturist and a speech therapist.  He denies fevers, chills, night sweats, nausea, vomiting, or diarrhea. He has no other complaints. Rest of the 10 point ROS is listed below.  The bulk of our discussion focused on the next steps moving forward.  His M protein is rising.  We discussed the need to start treatment but he would like to continue monitoring/observation at this time.  MEDICAL HISTORY:  Past Medical History:  Diagnosis Date   Asthma    GERD (gastroesophageal reflux disease)    High cholesterol    under control.    History of blood transfusion    Hypothyroidism    Interstitial lung disease (HCC)    Multiple myeloma (HCC)    Perennial allergic rhinitis    Pneumonia    Sciatic pain, right    Seasonal allergic rhinitis    Thyroid disease     SURGICAL HISTORY: Past  Surgical History:  Procedure Laterality Date   BRONCHIAL BIOPSY  06/18/2021   Procedure: BRONCHIAL BIOPSIES;  Surgeon: Leslye Peer, MD;  Location: [facility];  Service: Cardiopulmonary;;   BRONCHIAL BIOPSY  08/28/2021   Procedure: BRONCHIAL BIOPSIES;  Surgeon: Josephine Igo, DO;  Location: [facility];  Service: Cardiopulmonary;;   BRONCHIAL WASHINGS  06/18/2021   Procedure: BRONCHIAL WASHINGS;  Surgeon: Leslye Peer, MD;  Location: [facility];  Service: Cardiopulmonary;;   BRONCHIAL WASHINGS  08/28/2021   Procedure:  BRONCHIAL WASHINGS;  Surgeon: Josephine Igo, DO;  Location: [facility];  Service: Cardiopulmonary;;   NASAL SINUS SURGERY     NECK SURGERY  1996   VIDEO BRONCHOSCOPY N/A 06/18/2021   Procedure: VIDEO BRONCHOSCOPY WITH FLUORO;  Surgeon: Leslye Peer, MD;  Location: [facility];  Service: Cardiopulmonary;  Laterality: N/A;   VIDEO BRONCHOSCOPY N/A 08/28/2021   Procedure: VIDEO BRONCHOSCOPY WITH FLUORO;  Surgeon: Josephine Igo, DO;  Location: [facility];  Service: Cardiopulmonary;  Laterality: N/A;    SOCIAL HISTORY: Social History   Socioeconomic History   Marital status: Married    Spouse name: Not on file   Number of children: 3   Years of education: Not on file   Highest education level: Not on file  Occupational History   Not on file  Tobacco Use   Smoking status: Former    Current packs/day: 0.00    Average packs/day: 0.1 packs/day for 3.0 years (0.3 ttl pk-yrs)    Types: Cigarettes    Start date: 10/22/1979    Quit date: 10/21/1982    Years since quitting: 40.6   Smokeless tobacco: Never  Vaping Use   Vaping status: Never Used  Substance and Sexual Activity   Alcohol use: Not Currently    Comment: 1 drink daily   Drug use: No   Sexual activity: Not on file  Other Topics Concern   Not on file  Social History Narrative   Not on file   Social Determinants of Health   Financial Resource Strain: Low Risk  (03/15/2022)   [facility], [facility]   Overall Financial Resource Strain (CARDIA)    Difficulty of Paying Living Expenses: Not hard at all  Food Insecurity: Low Risk  (04/04/2023)   [facility]   Hunger Vital Sign    Worried About Running Out of Food in the Last Year: Never true    Ran Out of Food in the Last Year: Never true  Transportation Needs: No Transportation Needs (03/15/2022)   [facility], [facility]   [facility] - Transportation    Lack of Transportation (Medical): No    Lack of  Transportation (Non-Medical): No  Physical Activity: Insufficiently Active (01/10/2022)   [facility], [facility]   Exercise Vital Sign    Days of Exercise per Week: 3 days    Minutes of Exercise per Session: 30 min  Stress: No Stress Concern Present (01/10/2022)   Received from [name], [facility] - Occupational Stress Questionnaire    Feeling of Stress : Only a little  Social Connections: Socially Integrated (01/10/2022)   Received from [facility], [name]   Social Connection and Isolation Panel [NHANES]    Frequency of Communication with Friends and Family: More than three times a week    Frequency of Social Gatherings with Friends and Family: Twice a week    Attends Religious  Services: 1 to 4 times per year    Active Member of Clubs or Organizations: Yes    Attends Banker Meetings: More than 4 times per year    Marital Status: Married  Catering manager Violence: Not At Risk (03/15/2022)   Received from [name], [facility]   Humiliation, Afraid, Rape, and Kick questionnaire    Fear of Current or Ex-Partner: No    Emotionally Abused: No    Physically Abused: No    Sexually Abused: No    FAMILY HISTORY: Family History  Problem Relation Age of Onset   Hypertension Mother    Allergies Mother    Allergies Father    CAD Father 53   Breast cancer Paternal Grandmother    Hypertension Other    Heart disease Other     ALLERGIES:  is allergic to clarithromycin, ofev [nintedanib], and penicillins.  MEDICATIONS:  Current Outpatient Medications  Medication Sig Dispense Refill   Guaifenesin 1200 MG TB12 1 tablet as needed Orally every 12 hrs for 5 days     hydrOXYzine (ATARAX) 25 MG tablet Take 25 mg by mouth every 8 (eight) hours as needed.     levothyroxine (SYNTHROID) 50 MCG tablet Take 50 mcg by mouth daily before breakfast.     acyclovir (ZOVIRAX) 400 MG tablet Take 1 tablet (400 mg total) by  mouth 2 (two) times daily. 60 tablet 3   albuterol (PROVENTIL) (2.5 MG/3ML) 0.083% nebulizer solution Take 3 mLs (2.5 mg total) by nebulization every 6 (six) hours as needed for wheezing or shortness of breath. 120 mL 12   albuterol (VENTOLIN HFA) 108 (90 Base) MCG/ACT inhaler Inhale 2 puffs into the lungs every 6 hours as needed for wheezing or shortness of breath. 8.5 g 1   ALPRAZolam (XANAX) 0.5 MG tablet Take 0.5 mg by mouth at bedtime.     ASSESS FULL RANGE PEAK METER DEVI as directed.     azelastine (ASTELIN) 0.1 % nasal spray Place 2 sprays into both nostrils 2 (two) times daily. Use in each nostril as directed 30 mL 5   bisoprolol (ZEBETA) 5 MG tablet Take 1 tablet (5 mg total) by mouth daily. 30 tablet 2   Budeson-Glycopyrrol-Formoterol (BREZTRI AEROSPHERE) 160-9-4.8 MCG/ACT AERO Inhale 2 puffs into the lungs in the morning and at bedtime. 10.7 g 5   cholecalciferol (VITAMIN D) 1000 UNITS tablet Take 3,000 Units by mouth daily.     Dextromethorphan HBr (DELSYM PO) Take by mouth as needed. For cough     dextromethorphan-guaiFENesin (MUCINEX DM) 30-600 MG 12hr tablet Take 1 tablet by mouth 2 (two) times daily.     EPINEPHrine (EPI-PEN) 0.3 mg/0.3 mL DEVI Inject 0.3 mg into the muscle as needed.     ezetimibe-simvastatin (VYTORIN) 10-40 MG tablet Take 1 tablet by mouth daily.     fluticasone (FLONASE) 50 MCG/ACT nasal spray 1 spray.     ketoconazole (NIZORAL) 2 % cream SMARTSIG:1 Application Topical 1 to 2 Times Daily     loratadine (CLARITIN) 10 MG tablet Take 10 mg by mouth daily.     montelukast (SINGULAIR) 10 MG tablet Take 10 mg by mouth at bedtime.     Multiple Vitamin (MULTIVITAMIN) tablet Take 1 tablet by mouth daily.     omeprazole (PRILOSEC) 40 MG capsule Take 1 capsule (40 mg total) by mouth 2 (two) times daily. Take 30 minutes before a meal 60 capsule 3   OXYGEN Inhale 2 L into the lungs continuous. As needed  sildenafil (REVATIO) 20 MG tablet Take 20 mg by mouth daily as  needed (ED).     Simethicone (SIMETHICONE ULTRA STRENGTH) 180 MG CAPS Take 1 capsule (180 mg total) by mouth 3 (three) times daily as needed. 90 capsule 0   sodium chloride (OCEAN) 0.65 % SOLN nasal spray Place 1 spray into both nostrils as needed for congestion.     tamsulosin (FLOMAX) 0.4 MG CAPS capsule Take 0.4 mg by mouth daily.     thyroid (ARMOUR) 120 MG tablet Take 120 mg by mouth daily before breakfast.     triamcinolone cream (KENALOG) 0.1 % Apply 1 application topically daily as needed (sun burn itch).     Turmeric 400 MG CAPS 1 capsule Orally once a day     No current facility-administered medications for this visit.    REVIEW OF SYSTEMS:   Constitutional: ( - ) fevers, ( - )  chills , ( - ) night sweats Eyes: ( - ) blurriness of vision, ( - ) double vision, ( - ) watery eyes Ears, nose, mouth, throat, and face: ( - ) mucositis, ( - ) sore throat Respiratory: ( - ) cough, ( + ) dyspnea, ( - ) wheezes Cardiovascular: ( - ) palpitation, ( - ) chest discomfort, ( - ) lower extremity swelling Gastrointestinal:  ( - ) nausea, ( - ) heartburn, ( - ) change in bowel habits Skin: ( - ) abnormal skin rashes Lymphatics: ( - ) new lymphadenopathy, ( - ) easy bruising Neurological: ( - ) numbness, ( - ) tingling, ( - ) new weaknesses Behavioral/Psych: ( - ) mood change, ( - ) new changes  All other systems were reviewed with the patient and are negative.  PHYSICAL EXAMINATION: ECOG PERFORMANCE STATUS: 1 - Symptomatic but completely ambulatory  GENERAL: well appearing middle-aged Caucasian male in NAD  SKIN: skin color, texture, turgor are normal, no rashes or significant lesions EYES: conjunctiva are pink and non-injected, sclera clear LUNGS: clear to auscultation and percussion with normal breathing effort HEART: regular rate & rhythm and no murmurs and no lower extremity edema Musculoskeletal: no cyanosis of digits and no clubbing  PSYCH: alert & oriented x 3, fluent speech NEURO:  no focal motor/sensory deficits   LABORATORY DATA:  I have reviewed the data as listed    Latest Ref Rng & Units 06/16/2023    9:10 AM 05/19/2023    9:20 AM 04/21/2023    9:33 AM  CBC  WBC 4.0 - 10.5 K/uL 6.2  7.3  6.0   Hemoglobin 13.0 - 17.0 g/dL 55.7  32.2  02.5   Hematocrit 39.0 - 52.0 % 43.1  42.6  41.1   Platelets 150 - 400 K/uL 230  264  214        Latest Ref Rng & Units 06/16/2023    9:10 AM 05/19/2023    9:20 AM 04/21/2023    9:33 AM  CMP  Glucose 70 - 99 mg/dL 75  80  71   BUN 8 - 23 mg/dL 15  14  11    Creatinine 0.61 - 1.24 mg/dL 4.27  0.62  3.76   Sodium 135 - 145 mmol/L 132  128  130   Potassium 3.5 - 5.1 mmol/L 4.2  4.3  4.5   Chloride 98 - 111 mmol/L 97  94  96   CO2 22 - 32 mmol/L 29  29  30    Calcium 8.9 - 10.3 mg/dL 9.3  9.7  8.7  Total Protein 6.5 - 8.1 g/dL 8.1  8.1  7.6   Total Bilirubin 0.3 - 1.2 mg/dL 0.3  0.2  0.3   Alkaline Phos 38 - 126 U/L 68  79  68   AST 15 - 41 U/L 32  29  32   ALT 0 - 44 U/L 26  21  30      Lab Results  Component Value Date   MPROTEIN 0.8 (H) 05/19/2023   MPROTEIN 0.5 (H) 04/21/2023   MPROTEIN 0.5 (H) 03/24/2023   Lab Results  Component Value Date   KPAFRELGTCHN 35.2 (H) 06/16/2023   KPAFRELGTCHN 35.8 (H) 05/19/2023   KPAFRELGTCHN 42.7 (H) 04/21/2023   LAMBDASER 16.4 06/16/2023   LAMBDASER 19.1 05/19/2023   LAMBDASER 17.8 04/21/2023   KAPLAMBRATIO 2.15 (H) 06/16/2023   KAPLAMBRATIO 1.87 (H) 05/19/2023   KAPLAMBRATIO 2.40 (H) 04/21/2023    RADIOGRAPHIC STUDIES: I have personally reviewed the radiological images as listed and agreed with the findings in the report: lytic lesions in the hip bones bilaterally.  No results found.   ASSESSMENT & PLAN Samuel [name] 67 y.o. male with medical history significant for IgG Kappa multiple myeloma who presents for a follow up visit.   After review of the labs, review of the records, and discussion with the patient the findings are most consistent with an IgG kappa multiple  myeloma.  The patient has 2 lytic lesions within the pelvic bones (more noted in spine on CT scan) as well as 10% plasma cells within the bone marrow biopsy.  This combined with his serological findings confirm the diagnosis of multiple myeloma.  The initial treatment of choice for this patient's multiple myeloma was VRd. This will consist of bortezomib 1.5mg /m2 on days 1, 8, 15, dexamethasone 40mg  on days 1,8, and 15, and revlimid 25mg  PO daily days 1-14. Cycles will consist of 21 days. We will use this regimen to stabilize the patient's myeloma, then make a referral to a BMT [name] of his choosing for consideration of a bone marrow transplant once he has been noted to have a good response (VGPR or better). Zometa was started after dental clearance was obtained. This will be continued x 2 years.   Unfortunately had a drug reaction to [name] and was admitted to the hospital from 06/15/2021 until 06/18/2021.  The patient underwent a B AL which clearly showed evidence of eosinophils.  This is consistent with a drug reaction most likely caused by the patient's [name].  Discussed the case with pulmonology who recommended that we not rechallenge for attempt other drugs in the same class such as pomalidomide.  Given the patient's excellent response to [name] therapy might preference would be to continue that.  As such I would recommend we proceed with CyBorD chemotherapy.  Daratumumab-based therapy could have been considered, but is often paired with an immunologic.  Additionally I would like to preserve those for additional lines of therapy if necessary.  Therefore we will proceed with CyBorD chemotherapy have the patient referred to transplant.  R-IPSS score: Stage 2. PFS of 42 months  # IgG Kappa Multiple Myeloma (t11;14, standard risk)  --diagnostic criteria was met with monoclonal plasma cells in the bone marrow and lytic lesions on the bone --recommend proceeding with VRd chemotherapy as noted  above --patient is young and healthy enough for consideration of BMT, though he is borderline with his age. Will consider referral pending response to treatment.  -- Cycle 1 Day 1 started on 03/16/2021 --decreased dexamethasone to  20mg  PO weekly due to fluid overload. Also started lasix 20mg  PO (changed on 06/01/2021) --plan to HOLD revlimid moving forward after evidence of drug reaction in the lungs. --started CyBorD chemotherapy on 06/29/2021 --patient has reached a VGPR.  Plan: --Labs from last visit were reviewed.White blood cell count 6.2, hemoglobin 14.3, MCV 89.2, and platelets of 230. No cytopenias, renal dysfunction or hypercalcemia. MM panel showed M protein 0.8, SFLC stable.  --If M protein increases above 0.5 g/dL, recommend to resume therapy. Last level noted at 0.5.   --[facility] recommended Dara/[name]/dexamethasone which we agree is a reasonable regimen. Patient was aware of the risks and benefits regarding possible worsening of the lung function. --After detailed discussion with patient he would like to hold on treatment at this time due to his frail lung condition.  We will reassess his labs q 4 weeks and determine the steps moving forward.  If this continues upward trend in his lung function improves he would be amenable to starting treatment. --RTC in 12 weeks with interval 4 week with labs   #Fatigue: --Labs from today don't show clear etiology.  --Patient has chronic hyponatremia but doesn't explain recent symptoms. --Patient has chronic hypothyroidism and was told recently to increase dose. Patient would like a second opinion with endocrinologist. Sent referral to [name].  Appointment has not yet been scheduled, will work to make this happen.  #Drug Reaction in the Lung --confirmed with increased eosinophils on BAL during admission for shortness of breath -- on pirfenidone and steroid taper managed by pulmonologist, Dr. Marchelle Gearing.  #Supportive Care --  chemotherapy education complete --zometa therapy started on 04/05/2021 (4 mg IV q 3 months), dose held today per patient's request. --ASA 81mg  PO daily for thromboprophylaxis on revlimid -- zofran 8mg  q8H PRN and compazine 10mg  PO q6H for nausea -- acyclovir 400mg  PO BID for VCZ prophylaxis -- no pain medication required at this time.   No orders of the defined types were placed in this encounter.  All questions were answered. The patient knows to call the clinic with any problems, questions or concerns.  I have spent a total of 30 minutes minutes of face-to-face and non-face-to-face time, preparing to see the patient,performing a medically appropriate examination, counseling and educating the patient,  documenting clinical information in the electronic health record,  and care coordination.   Ulysees Barns, MD Department of Hematology/Oncology [name] Phone: [phone] Pager: [phone] Email: [email or web address]  06/20/2023 11:09 AM

## 2023-06-16 ENCOUNTER — Inpatient Hospital Stay (HOSPITAL_BASED_OUTPATIENT_CLINIC_OR_DEPARTMENT_OTHER): Payer: Medicare Other | Admitting: Hematology and Oncology

## 2023-06-16 ENCOUNTER — Inpatient Hospital Stay: Payer: Medicare Other | Attending: Hematology and Oncology

## 2023-06-16 VITALS — BP 132/78 | HR 53 | Temp 97.5°F | Resp 20 | Ht 70.0 in | Wt 202.9 lb

## 2023-06-16 DIAGNOSIS — Z79899 Other long term (current) drug therapy: Secondary | ICD-10-CM | POA: Insufficient documentation

## 2023-06-16 DIAGNOSIS — D472 Monoclonal gammopathy: Secondary | ICD-10-CM | POA: Diagnosis not present

## 2023-06-16 DIAGNOSIS — Z7969 Long term (current) use of other immunomodulators and immunosuppressants: Secondary | ICD-10-CM | POA: Diagnosis not present

## 2023-06-16 DIAGNOSIS — Z7961 Long term (current) use of immunomodulator: Secondary | ICD-10-CM | POA: Diagnosis not present

## 2023-06-16 DIAGNOSIS — C9000 Multiple myeloma not having achieved remission: Secondary | ICD-10-CM

## 2023-06-16 DIAGNOSIS — Z7952 Long term (current) use of systemic steroids: Secondary | ICD-10-CM | POA: Insufficient documentation

## 2023-06-16 DIAGNOSIS — Z79624 Long term (current) use of inhibitors of nucleotide synthesis: Secondary | ICD-10-CM | POA: Diagnosis not present

## 2023-06-16 DIAGNOSIS — Z87891 Personal history of nicotine dependence: Secondary | ICD-10-CM | POA: Insufficient documentation

## 2023-06-16 DIAGNOSIS — Z9481 Bone marrow transplant status: Secondary | ICD-10-CM | POA: Insufficient documentation

## 2023-06-16 LAB — CMP (CANCER CENTER ONLY)
ALT: 26 U/L (ref 0–44)
AST: 32 U/L (ref 15–41)
Albumin: 4.2 g/dL (ref 3.5–5.0)
Alkaline Phosphatase: 68 U/L (ref 38–126)
Anion gap: 6 (ref 5–15)
BUN: 15 mg/dL (ref 8–23)
CO2: 29 mmol/L (ref 22–32)
Calcium: 9.3 mg/dL (ref 8.9–10.3)
Chloride: 97 mmol/L — ABNORMAL LOW (ref 98–111)
Creatinine: 0.88 mg/dL (ref 0.61–1.24)
GFR, Estimated: >60 mL/min (ref >60)
Glucose, Bld: 75 mg/dL (ref 70–99)
Potassium: 4.2 mmol/L (ref 3.5–5.1)
Sodium: 132 mmol/L — ABNORMAL LOW (ref 135–145)
Total Bilirubin: 0.3 mg/dL (ref 0.3–1.2)
Total Protein: 8.1 g/dL (ref 6.5–8.1)

## 2023-06-16 LAB — LACTATE DEHYDROGENASE: LDH: 251 U/L — ABNORMAL HIGH (ref 98–192)

## 2023-06-16 LAB — CBC WITH DIFFERENTIAL (CANCER CENTER ONLY)
Abs Immature Granulocytes: 0.01 10*3/uL (ref 0.00–0.07)
Basophils Absolute: 0 10*3/uL (ref 0.0–0.1)
Basophils Relative: 0 %
Eosinophils Absolute: 0.2 10*3/uL (ref 0.0–0.5)
Eosinophils Relative: 3 %
HCT: 43.1 % (ref 39.0–52.0)
Hemoglobin: 14.3 g/dL (ref 13.0–17.0)
Immature Granulocytes: 0 %
Lymphocytes Relative: 15 %
Lymphs Abs: 1 10*3/uL (ref 0.7–4.0)
MCH: 29.6 pg (ref 26.0–34.0)
MCHC: 33.2 g/dL (ref 30.0–36.0)
MCV: 89.2 fL (ref 80.0–100.0)
Monocytes Absolute: 0.5 10*3/uL (ref 0.1–1.0)
Monocytes Relative: 9 %
Neutro Abs: 4.5 10*3/uL (ref 1.7–7.7)
Neutrophils Relative %: 73 %
Platelet Count: 230 10*3/uL (ref 150–400)
RBC: 4.83 MIL/uL (ref 4.22–5.81)
RDW: 13.6 % (ref 11.5–15.5)
WBC Count: 6.2 10*3/uL (ref 4.0–10.5)
nRBC: 0 % (ref 0.0–0.2)

## 2023-06-16 NOTE — Therapy (Unsigned)
OUTPATIENT SPEECH LANGUAGE PATHOLOGY VOICE TREATMENT   Patient Name: Samuel [name] MRN: [id number] DOB:[birth year], 67 y.o., male Today's Date: 06/17/2023  PCP: Tally Joe, MD REFERRING PROVIDER: Tally Joe, MD  END OF SESSION:  End of Session - 06/17/23 0928     Visit Number 6    Number of Visits 17    Date for SLP Re-Evaluation 07/21/23    Authorization Type medicare/ BCBS    SLP Start Time 0930    SLP Stop Time  1015    SLP Time Calculation (min) 45 min    Activity Tolerance Patient tolerated treatment well                 Past Medical History:  Diagnosis Date   Asthma    GERD (gastroesophageal reflux disease)    High cholesterol    under control.    History of blood transfusion    Hypothyroidism    Interstitial lung disease (HCC)    Multiple myeloma (HCC)    Perennial allergic rhinitis    Pneumonia    Sciatic pain, right    Seasonal allergic rhinitis    Thyroid disease    Past Surgical History:  Procedure Laterality Date   BRONCHIAL BIOPSY  06/18/2021   Procedure: BRONCHIAL BIOPSIES;  Surgeon: Leslye Peer, MD;  Location: [facility];  Service: Cardiopulmonary;;   BRONCHIAL BIOPSY  08/28/2021   Procedure: BRONCHIAL BIOPSIES;  Surgeon: Josephine Igo, DO;  Location: [facility];  Service: Cardiopulmonary;;   BRONCHIAL WASHINGS  06/18/2021   Procedure: BRONCHIAL WASHINGS;  Surgeon: Leslye Peer, MD;  Location: [facility];  Service: Cardiopulmonary;;   BRONCHIAL WASHINGS  08/28/2021   Procedure: BRONCHIAL WASHINGS;  Surgeon: Josephine Igo, DO;  Location: [facility];  Service: Cardiopulmonary;;   NASAL SINUS SURGERY     NECK SURGERY  1996   VIDEO BRONCHOSCOPY N/A 06/18/2021   Procedure: VIDEO BRONCHOSCOPY WITH FLUORO;  Surgeon: Leslye Peer, MD;  Location: [facility];  Service: Cardiopulmonary;  Laterality: N/A;   VIDEO BRONCHOSCOPY N/A 08/28/2021   Procedure: VIDEO BRONCHOSCOPY WITH FLUORO;  Surgeon: Josephine Igo, DO;   Location: [facility];  Service: Cardiopulmonary;  Laterality: N/A;   Patient Active Problem List   Diagnosis Date Noted   Coronary artery disease 11/28/2022   Mixed hyperlipidemia 11/28/2022   Recurrent sinusitis 11/04/2022   Acute bacterial rhinosinusitis 09/26/2022   Chronic respiratory failure with hypoxia (HCC) 08/01/2022   S/P bronchoscopy    ILD (interstitial lung disease) (HCC)    Preop cardiovascular exam 08/21/2021   Pneumonitis 07/20/2021   Anxiety 07/20/2021   Interstitial pulmonary disease (HCC) 07/20/2021   Abnormal CT of the chest 06/18/2021   Dyspnea 06/15/2021   Hypoxia 06/15/2021   Multiple myeloma (HCC) 03/05/2021   Multiple myeloma not having achieved remission (HCC) 03/05/2021   Bone lesion 02/14/2021   Monoclonal gammopathy 02/14/2021   High cholesterol    Allergic rhinitis 11/23/2020   Mild persistent asthma 11/23/2020   SOB (shortness of breath) 10/04/2011    Onset date: 05/15/23 (referral date)  REFERRING DIAG: R49.0 (ICD-10-CM) - Dysphonia  THERAPY DIAG: Other voice and resonance disorders  Rationale for Evaluation and Treatment: Rehabilitation  SUBJECTIVE:   SUBJECTIVE STATEMENT: Took call upon entrance to SLP office. Overt increased work of breath noted resulting in clavicular breathing. SLP non-verbally instructed relaxation and deeper breath, which aided resolution of increased work of breath.  PERTINENT HISTORY: "Samuel [name] is a 67 y.o. male who presents as a return  patient, for follow-up of sinusitis and globus sensation. At time of last visit on 03/14/2023, nasal culture was obtained following thick mucoid drainage noted in the right nasal cavity. This was abnormal, and demonstrated gram-positive bacilli. Patient was subsequently treated with a 2-week course of oral antibiotics, and presents today for posttreatment imaging. He has continued to use proton pump inhibitor twice daily, but continues to experience frequent throat clearing and  globus sensation. He states he has been working on dietary and lifestyle modifications as previously instructed, and has been partially successful. He still has difficulty with frequent throat clearing and needing to cough often. He also has not been able to obtain humidification for his oxygen, as the DME company stated they needed a prescription.  Last visit 03/14/2023: Samuel [name] is a 67 y.o. male who presents as a new consult, referred by [name], FNP , for evaluation and treatment of sinus issues, hoarseness, and throat symptoms. He is accompanied by his wife.  Patient had history of multiple myeloma, status post treatment with chemotherapy. He developed interstitial lung disease following chemotherapeutic treatment, and is currently followed closely by pulmonology. He also endorses history of significant environmental allergies, and reports that recent allergy testing showed positivity to "everything.". He was previously on immunotherapy, but this was held during his cancer treatment. He is considering starting back on the chemotherapy due to the significance of his symptoms. He is currently using Singulair, antihistamine and Patanase nasal spray on a daily basis. He also reports using nasal saline irrigations on a daily basis. He reports history of deviated nasal septum repair in 1983. He denies history of sinusitis requiring treatment with oral antibiotics in the last 12 months. His primary symptoms are nasal congestion and postnasal drainage. He denies facial pressure, pain, hyposmia, hypogeusia. He has not had any imaging of his head or sinuses. He does occasionally experience headaches. He uses oxygen on a nightly basis, and states that his oxygen is currently not humidified.  He reports symptoms of silent reflux, and states he is currently taking omeprazole on a daily basis. [name] is frequent throat clearing and globus sensation. He also reports occasional hoarseness."  PAIN:   Are you having pain? No  FALLS: Has patient fallen in last 6 months? No  LIVING ENVIRONMENT: Lives with: lives with their family Lives in: House/apartment  PLOF:Level of assistance: Independent with ADLs, Independent with IADLs Employment: Full-time employment  PATIENT GOALS: "control my breath, control my speech, and relaxation"   OBJECTIVE:   DIAGNOSTIC FINDINGS: "Nasolaryngoscopy performed at last visit demonstrated moderate interarytenoid edema with otherwise normal upper airway exam. Counseled patient to continue twice daily omeprazole for next 2-3 months. We discussed dietary lifestyle modifications for treatment of reflux to include avoiding spicy and acidic foods which exacerbate reflux, avoiding late meals and snacking, avoiding caffeine and stimulants, avoiding carbonated and alcoholic beverages, adequate oral hydration, avoidance of throat clearing and sleeping with the head of bed elevated. Recommended follow-up with gastroenterology if symptoms do not improve after 3 months of twice daily PPI. Offered referral to speech-language pathology due to continued hoarseness and frequent throat clearing, patient was agreeable, order placed today."   COGNITION: Overall cognitive status: Within functional limits for tasks assessed  SOCIAL HISTORY: Occupation: Full time - Social research officer, government intake: optimal but varies - typically 3x16 oz of water Caffeine/alcohol intake:  None Daily voice use: moderate at work, minimal at home   PERCEPTUAL VOICE ASSESSMENT: Voice quality: hoarse, breathy, rough, low vocal intensity,  and vocal fatigue Vocal abuse: habitual throat clearing, abnormal breathing pattern, and excessive voice use Resonance: normal Respiratory function: clavicular breathing and speaking on residual capacity  OBJECTIVE VOICE ASSESSMENT: Maximum phonation time for sustained "ah": 71 (4-5 seconds max)  Conversational loudness average: 68 dB with intentional   Conversational loudness range: 65-70 dB S/z ratio: 1 but limited seconds (4 seconds) (Suggestive of dysfunction >1.0)  PATIENT REPORTED OUTCOME MEASURES (PROM): Communication Participation Item Bank: 9 "quite a bit" for all situations  but "very much" for getting turn in fast moving conversation  TODAY'S TREATMENT:                                                                                                                                         06/17/23: See subjective statement - cues effective to reduce upper body tension and use diaphragmatic breathing when heightened stress exhibite. Suggested setting intent with abdominal breaths x3 prior to answering phone. Rare coughing x2, which subsided with mod A to reduce tension and focus on breathing. Targeted conversation today with intermittent min A required to reduce shoulder tension, focus on abdominal breath with hand on abdomen, and project voice forward. Utilize fidget aid to aid relaxation with mild benefit.   06/11/23: Reported positive affirmations from colleagues re: improved voice quality and decreased coughing. Today, occasional min A required to ID and reduce upper body tension, use breathing techniques to suppress cough, and increase vocal intensity in conversation. Provided recommendations to aid awareness and mitigation of cough based on initial symptoms and how to manage breath support during transitions periods.   06/09/23: Reported good carryover of trained techniques to suppress cough/throat clearing with benefit endorsed. Pt able to independently reduce/suppress cough/throat clear with ~60% accuracy this session. Occasional min A otherwise needed, mostly to relax upper body. Trialed SOVT and resonant exercises, which were limited d/t breath support and cyclical nature of VCD. Targeted "clear" voice with cue to speak out, with benefit achieved at phrase level. Plan to address next session.   06/06/23: Reviewed cyclical nature of  VCD/chronic throat clearing or coughing. SLP answered pt's questions regarding role of reflux. Pt able to teach back strategies to avoid chronic throat clearing: relaxing shoulders, neck, chest, sniff and blow, hard swallow, water sips. Addressed use of strategies during conversational speech, as pt reporting can be challenging to engage in work meeting/appointments when occurring. Pt demonstrating x1 coughing episode, lasting ~20 seconds, responsive to SLP cues to use water and hard swallow. X6 throat clears, which was minimal compared to instances in which pt demonstrated mod-I employment of alternative. Generated strategy to further address stress in regards to pt perceived trigger for increasing upper body tension.   06-04-23: Pt entered with cough which persisted throughout session. Based on reported s/sx and triggers, SLP suspects chronic cough/VCD. Provided education and handout re: VCD and throat clear alternatives. Trained patient in diaphragmatic breathing and sniff-sniff-blow  technique to suppress cough, in which pt able to demo with usual fading to occasional min A. Guided patient in self-analysis of upper body tension given usual tension present in chest/neck. Tension resulted in breath hold during inhale/exhale cycle. Breathing pattern improved with use of trained techniques. Trialed SOVTE, which was impacted by impaired breath support. Will incorporate as able.   05-26-23: Briefly discussed 2 sub-systems of voicing (respiratory and phonatory). Dicussed plan to further target diaphragmatic breathing, forward projection of voice, and reduction in throat clears to optimize vocal quality. Pt verbalized understanding and denied further questions at this time.    PATIENT EDUCATION: Education details: see above  Person educated: Patient and Spouse Education method: Medical illustrator Education comprehension: verbalized understanding, returned demonstration, and needs further  education  HOME EXERCISE PROGRAM: Abdominal breathing, forward resonance  GOALS: Goals reviewed with patient? Yes  SHORT TERM GOALS: Target date: 06/23/2023  Pt will reduce throat clearing and coughing by 50% at STG date using trained techniques Baseline: > 9, 6 respectively Goal status: IN PROGRESS  2.  Pt will employ abdominal breathing during structured tasks with 80% accuracy given occasional min A  Baseline:  Goal status: IN PROGRESS  3.  Pt will employ abdominal breathing during structured conversation with 80% accuracy given occasional min A Baseline:  Goal status: IN PROGRESS  4.  Pt will maintain clear vocal quality in 5-10 minute structured conversations x2 given occasional min A  Baseline:  Goal status: IN PROGRESS    LONG TERM GOALS: Target date: 07/21/2023  Pt will reduce throat clearing and coughing by 75% at LTG date using trained techniques Baseline:  Goal status: IN PROGRESS  2.  Pt will employ abdominal breathing during unstructured conversation with 80% accuracy given rare min A Baseline:  Goal status: IN PROGRESS  3.  Pt will maintain clear vocal quality in 15+ minute unstructured conversations x2 given rare min A  Baseline:  Goal status: IN PROGRESS  4.  Pt will report improved communication effectiveness via PROM by 2 pts at last ST session  Baseline: CPIB=9 Goal status: IN PROGRESS  ASSESSMENT:  CLINICAL IMPRESSION: Patient is a 67 y.o. M who was seen today for dysphonia. PMHX significant for Multiple Myeloma, interstitial lung disease, and GERD. Conducted ongoing education and instruction of cough suppression techniques, relaxed diaphragmatic breathing, throat clear alternatives, and clear voicing, with improving carryover exhibited. Pt would benefit from skilled ST intervention to train vocal hygiene protocol and voice techniques to optimize vocal quality and communication effectiveness.   OBJECTIVE IMPAIRMENTS: include voice disorder. These  impairments are limiting patient from effectively communicating at home and in community. Factors affecting potential to achieve goals and functional outcome are co-morbidities. Patient will benefit from skilled SLP services to address above impairments and improve overall function.  REHAB POTENTIAL: Good  PLAN:  SLP FREQUENCY: 2x/week  SLP DURATION: 8 weeks  PLANNED INTERVENTIONS: Language facilitation, Environmental controls, Cueing hierachy, Internal/external aids, Functional tasks, Multimodal communication approach, SLP instruction and feedback, Compensatory strategies, Patient/family education, and Re-evaluation    Gracy Racer, CCC-SLP 06/17/2023, 11:06 AM

## 2023-06-17 ENCOUNTER — Ambulatory Visit: Payer: Medicare Other

## 2023-06-17 ENCOUNTER — Telehealth: Payer: Self-pay | Admitting: Hematology and Oncology

## 2023-06-17 DIAGNOSIS — R498 Other voice and resonance disorders: Secondary | ICD-10-CM | POA: Diagnosis not present

## 2023-06-17 NOTE — Therapy (Unsigned)
OUTPATIENT SPEECH LANGUAGE PATHOLOGY VOICE TREATMENT   Patient Name: Samuel [name] MRN: [id number] DOB:[birth year], 67 y.o., male Today's Date: 06/18/2023  PCP: Tally Joe, MD REFERRING PROVIDER: Tally Joe, MD  END OF SESSION:  End of Session - 06/18/23 1039     Visit Number 7    Number of Visits 17    Date for SLP Re-Evaluation 07/21/23    Authorization Type medicare/ BCBS    SLP Start Time [phone]    SLP Stop Time  1015    SLP Time Calculation (min) 42 min    Activity Tolerance Patient tolerated treatment well                  Past Medical History:  Diagnosis Date   Asthma    GERD (gastroesophageal reflux disease)    High cholesterol    under control.    History of blood transfusion    Hypothyroidism    Interstitial lung disease (HCC)    Multiple myeloma (HCC)    Perennial allergic rhinitis    Pneumonia    Sciatic pain, right    Seasonal allergic rhinitis    Thyroid disease    Past Surgical History:  Procedure Laterality Date   BRONCHIAL BIOPSY  06/18/2021   Procedure: BRONCHIAL BIOPSIES;  Surgeon: Leslye Peer, MD;  Location: [facility];  Service: Cardiopulmonary;;   BRONCHIAL BIOPSY  08/28/2021   Procedure: BRONCHIAL BIOPSIES;  Surgeon: Josephine Igo, DO;  Location: [facility];  Service: Cardiopulmonary;;   BRONCHIAL WASHINGS  06/18/2021   Procedure: BRONCHIAL WASHINGS;  Surgeon: Leslye Peer, MD;  Location: [facility];  Service: Cardiopulmonary;;   BRONCHIAL WASHINGS  08/28/2021   Procedure: BRONCHIAL WASHINGS;  Surgeon: Josephine Igo, DO;  Location: [facility];  Service: Cardiopulmonary;;   NASAL SINUS SURGERY     NECK SURGERY  1996   VIDEO BRONCHOSCOPY N/A 06/18/2021   Procedure: VIDEO BRONCHOSCOPY WITH FLUORO;  Surgeon: Leslye Peer, MD;  Location: [facility];  Service: Cardiopulmonary;  Laterality: N/A;   VIDEO BRONCHOSCOPY N/A 08/28/2021   Procedure: VIDEO BRONCHOSCOPY WITH FLUORO;  Surgeon: Josephine Igo, DO;   Location: [facility];  Service: Cardiopulmonary;  Laterality: N/A;   Patient Active Problem List   Diagnosis Date Noted   Coronary artery disease 11/28/2022   Mixed hyperlipidemia 11/28/2022   Recurrent sinusitis 11/04/2022   Acute bacterial rhinosinusitis 09/26/2022   Chronic respiratory failure with hypoxia (HCC) 08/01/2022   S/P bronchoscopy    ILD (interstitial lung disease) (HCC)    Preop cardiovascular exam 08/21/2021   Pneumonitis 07/20/2021   Anxiety 07/20/2021   Interstitial pulmonary disease (HCC) 07/20/2021   Abnormal CT of the chest 06/18/2021   Dyspnea 06/15/2021   Hypoxia 06/15/2021   Multiple myeloma (HCC) 03/05/2021   Multiple myeloma not having achieved remission (HCC) 03/05/2021   Bone lesion 02/14/2021   Monoclonal gammopathy 02/14/2021   High cholesterol    Allergic rhinitis 11/23/2020   Mild persistent asthma 11/23/2020   SOB (shortness of breath) 10/04/2011    Onset date: 05/15/23 (referral date)  REFERRING DIAG: R49.0 (ICD-10-CM) - Dysphonia  THERAPY DIAG: Other voice and resonance disorders  Rationale for Evaluation and Treatment: Rehabilitation  SUBJECTIVE:   SUBJECTIVE STATEMENT: Pt reported that he focused on intentional diaphragmatic breathing during a conversation outside of therapy. Brought water bottle as recommended to reduce throat clearing and improve vocal hygiene.  Accompanied by: self  PERTINENT HISTORY: "Samuel [name] is a 67 y.o. male who presents as a  return patient, for follow-up of sinusitis and globus sensation. At time of last visit on 03/14/2023, nasal culture was obtained following thick mucoid drainage noted in the right nasal cavity. This was abnormal, and demonstrated gram-positive bacilli. Patient was subsequently treated with a 2-week course of oral antibiotics, and presents today for posttreatment imaging. He has continued to use proton pump inhibitor twice daily, but continues to experience frequent throat clearing and  globus sensation. He states he has been working on dietary and lifestyle modifications as previously instructed, and has been partially successful. He still has difficulty with frequent throat clearing and needing to cough often. He also has not been able to obtain humidification for his oxygen, as the DME company stated they needed a prescription.  Last visit 03/14/2023: Samuel [name] is a 67 y.o. male who presents as a new consult, referred by [name], FNP , for evaluation and treatment of sinus issues, hoarseness, and throat symptoms. He is accompanied by his wife.  Patient had history of multiple myeloma, status post treatment with chemotherapy. He developed interstitial lung disease following chemotherapeutic treatment, and is currently followed closely by pulmonology. He also endorses history of significant environmental allergies, and reports that recent allergy testing showed positivity to "everything.". He was previously on immunotherapy, but this was held during his cancer treatment. He is considering starting back on the chemotherapy due to the significance of his symptoms. He is currently using Singulair, antihistamine and Patanase nasal spray on a daily basis. He also reports using nasal saline irrigations on a daily basis. He reports history of deviated nasal septum repair in 1983. He denies history of sinusitis requiring treatment with oral antibiotics in the last 12 months. His primary symptoms are nasal congestion and postnasal drainage. He denies facial pressure, pain, hyposmia, hypogeusia. He has not had any imaging of his head or sinuses. He does occasionally experience headaches. He uses oxygen on a nightly basis, and states that his oxygen is currently not humidified.  He reports symptoms of silent reflux, and states he is currently taking omeprazole on a daily basis. [name] is frequent throat clearing and globus sensation. He also reports occasional hoarseness."  PAIN:   Are you having pain? No  FALLS: Has patient fallen in last 6 months? No  LIVING ENVIRONMENT: Lives with: lives with their family Lives in: House/apartment  PLOF:Level of assistance: Independent with ADLs, Independent with IADLs Employment: Full-time employment  PATIENT GOALS: "control my breath, control my speech, and relaxation"   OBJECTIVE:   DIAGNOSTIC FINDINGS: "Nasolaryngoscopy performed at last visit demonstrated moderate interarytenoid edema with otherwise normal upper airway exam. Counseled patient to continue twice daily omeprazole for next 2-3 months. We discussed dietary lifestyle modifications for treatment of reflux to include avoiding spicy and acidic foods which exacerbate reflux, avoiding late meals and snacking, avoiding caffeine and stimulants, avoiding carbonated and alcoholic beverages, adequate oral hydration, avoidance of throat clearing and sleeping with the head of bed elevated. Recommended follow-up with gastroenterology if symptoms do not improve after 3 months of twice daily PPI. Offered referral to speech-language pathology due to continued hoarseness and frequent throat clearing, patient was agreeable, order placed today."   COGNITION: Overall cognitive status: Within functional limits for tasks assessed  SOCIAL HISTORY: Occupation: Full time - Social research officer, government intake: optimal but varies - typically 3x16 oz of water Caffeine/alcohol intake:  None Daily voice use: moderate at work, minimal at home   PERCEPTUAL VOICE ASSESSMENT: Voice quality: hoarse, breathy, rough, low vocal  intensity, and vocal fatigue Vocal abuse: habitual throat clearing, abnormal breathing pattern, and excessive voice use Resonance: normal Respiratory function: clavicular breathing and speaking on residual capacity  OBJECTIVE VOICE ASSESSMENT: Maximum phonation time for sustained "ah": 71 (4-5 seconds max)  Conversational loudness average: 68 dB with intentional   Conversational loudness range: 65-70 dB S/z ratio: 1 but limited seconds (4 seconds) (Suggestive of dysfunction >1.0)  PATIENT REPORTED OUTCOME MEASURES (PROM): Communication Participation Item Bank: 9 "quite a bit" for all situations  but "very much" for getting turn in fast moving conversation  TODAY'S TREATMENT:                                                                                                                                         06/18/23: Demonstrating improved awareness and correction of upper body tension. Minimal throat clearing observed throughout the session. Implementing throat clear alternatives with mod I. Targeted reduction of body tension and increased breath support during structured reading task. Able to reduce upper body tension and use abdominal breathing in sentences and short paragraphs with occasional fading min A. Intermittent but mild coughing attacks noted with occasional cues required to suppress. Updated HEP to include structured reading practice.   06/17/23: See subjective statement - cues effective to reduce upper body tension and use diaphragmatic breathing when heightened stress exhibite. Suggested setting intent with abdominal breaths x3 prior to answering phone. Rare coughing x2, which subsided with mod A to reduce tension and focus on breathing. Targeted conversation today with intermittent min A required to reduce shoulder tension, focus on abdominal breath with hand on abdomen, and project voice forward. Utilize fidget aid to aid relaxation with mild benefit.   06/11/23: Reported positive affirmations from colleagues re: improved voice quality and decreased coughing. Today, occasional min A required to ID and reduce upper body tension, use breathing techniques to suppress cough, and increase vocal intensity in conversation. Provided recommendations to aid awareness and mitigation of cough based on initial symptoms and how to manage breath support during  transitions periods.   06/09/23: Reported good carryover of trained techniques to suppress cough/throat clearing with benefit endorsed. Pt able to independently reduce/suppress cough/throat clear with ~60% accuracy this session. Occasional min A otherwise needed, mostly to relax upper body. Trialed SOVT and resonant exercises, which were limited d/t breath support and cyclical nature of VCD. Targeted "clear" voice with cue to speak out, with benefit achieved at phrase level. Plan to address next session.   06/06/23: Reviewed cyclical nature of VCD/chronic throat clearing or coughing. SLP answered pt's questions regarding role of reflux. Pt able to teach back strategies to avoid chronic throat clearing: relaxing shoulders, neck, chest, sniff and blow, hard swallow, water sips. Addressed use of strategies during conversational speech, as pt reporting can be challenging to engage in work meeting/appointments when occurring. Pt demonstrating x1 coughing episode, lasting ~20 seconds, responsive to SLP  cues to use water and hard swallow. X6 throat clears, which was minimal compared to instances in which pt demonstrated mod-I employment of alternative. Generated strategy to further address stress in regards to pt perceived trigger for increasing upper body tension.   06-04-23: Pt entered with cough which persisted throughout session. Based on reported s/sx and triggers, SLP suspects chronic cough/VCD. Provided education and handout re: VCD and throat clear alternatives. Trained patient in diaphragmatic breathing and sniff-sniff-blow technique to suppress cough, in which pt able to demo with usual fading to occasional min A. Guided patient in self-analysis of upper body tension given usual tension present in chest/neck. Tension resulted in breath hold during inhale/exhale cycle. Breathing pattern improved with use of trained techniques. Trialed SOVTE, which was impacted by impaired breath support. Will incorporate as  able.   05-26-23: Briefly discussed 2 sub-systems of voicing (respiratory and phonatory). Dicussed plan to further target diaphragmatic breathing, forward projection of voice, and reduction in throat clears to optimize vocal quality. Pt verbalized understanding and denied further questions at this time.    PATIENT EDUCATION: Education details: see above  Person educated: Patient and Spouse Education method: Medical illustrator Education comprehension: verbalized understanding, returned demonstration, and needs further education  HOME EXERCISE PROGRAM: Abdominal breathing, forward resonance  GOALS: Goals reviewed with patient? Yes  SHORT TERM GOALS: Target date: 06/23/2023  Pt will reduce throat clearing and coughing by 50% at STG date using trained techniques Baseline: > 9, 6 respectively Goal status: IN PROGRESS  2.  Pt will employ abdominal breathing during structured tasks with 80% accuracy given occasional min A  Baseline:  Goal status: MET  3.  Pt will employ abdominal breathing during structured conversation with 80% accuracy given occasional min A Baseline:  Goal status: IN PROGRESS  4.  Pt will maintain clear vocal quality in 5-10 minute structured conversations x2 given occasional min A  Baseline:  Goal status: IN PROGRESS    LONG TERM GOALS: Target date: 07/21/2023  Pt will reduce throat clearing and coughing by 75% at LTG date using trained techniques Baseline:  Goal status: IN PROGRESS  2.  Pt will employ abdominal breathing during unstructured conversation with 80% accuracy given rare min A Baseline:  Goal status: IN PROGRESS  3.  Pt will maintain clear vocal quality in 15+ minute unstructured conversations x2 given rare min A  Baseline:  Goal status: IN PROGRESS  4.  Pt will report improved communication effectiveness via PROM by 2 pts at last ST session  Baseline: CPIB=9 Goal status: IN PROGRESS  ASSESSMENT:  CLINICAL IMPRESSION: Patient  is a 67 y.o. M who was seen today for dysphonia. PMHX significant for Multiple Myeloma, interstitial lung disease, and GERD. Conducted ongoing education and instruction of cough suppression techniques, relaxed diaphragmatic breathing, throat clear alternatives, and clear voicing, with improving carryover exhibited. Pt would benefit from skilled ST intervention to train vocal hygiene protocol and voice techniques to optimize vocal quality and communication effectiveness.   OBJECTIVE IMPAIRMENTS: include voice disorder. These impairments are limiting patient from effectively communicating at home and in community. Factors affecting potential to achieve goals and functional outcome are co-morbidities. Patient will benefit from skilled SLP services to address above impairments and improve overall function.  REHAB POTENTIAL: Good  PLAN:  SLP FREQUENCY: 2x/week  SLP DURATION: 8 weeks  PLANNED INTERVENTIONS: Language facilitation, Environmental controls, Cueing hierachy, Internal/external aids, Functional tasks, Multimodal communication approach, SLP instruction and feedback, Compensatory strategies, Patient/family education, and Re-evaluation  Delany Loonam, Student-SLP 06/18/2023, 11:34 AM

## 2023-06-17 NOTE — Patient Instructions (Addendum)
Prior to beginning your conversation, find that relaxed but intentional breathing pattern. Set the intent by putting hand on your stomach to cue diaphragmatic breath   Try to establish the bringing water bottle with you whenever you go somewhere  Look into the mirror feedback

## 2023-06-18 ENCOUNTER — Ambulatory Visit: Payer: Medicare Other

## 2023-06-18 DIAGNOSIS — R498 Other voice and resonance disorders: Secondary | ICD-10-CM | POA: Diagnosis not present

## 2023-06-18 LAB — KAPPA/LAMBDA LIGHT CHAINS
Kappa free light chain: 35.2 mg/L — ABNORMAL HIGH (ref 3.3–19.4)
Kappa, lambda light chain ratio: 2.15 — ABNORMAL HIGH (ref 0.26–1.65)
Lambda free light chains: 16.4 mg/L (ref 5.7–26.3)

## 2023-06-20 ENCOUNTER — Encounter: Payer: Self-pay | Admitting: Hematology and Oncology

## 2023-06-20 LAB — MULTIPLE MYELOMA PANEL, SERUM
Albumin SerPl Elph-Mcnc: 3.6 g/dL (ref 2.9–4.4)
Albumin/Glob SerPl: 1.1 (ref 0.7–1.7)
Alpha 1: 0.2 g/dL (ref 0.0–0.4)
Alpha2 Glob SerPl Elph-Mcnc: 0.8 g/dL (ref 0.4–1.0)
B-Globulin SerPl Elph-Mcnc: 0.9 g/dL (ref 0.7–1.3)
Gamma Glob SerPl Elph-Mcnc: 1.7 g/dL (ref 0.4–1.8)
Globulin, Total: 3.6 g/dL (ref 2.2–3.9)
IgA: 254 mg/dL (ref 61–437)
IgG (Immunoglobin G), Serum: 1884 mg/dL — ABNORMAL HIGH (ref 603–1613)
IgM (Immunoglobulin M), Srm: 93 mg/dL (ref 20–172)
M Protein SerPl Elph-Mcnc: 0.8 g/dL — ABNORMAL HIGH
Total Protein ELP: 7.2 g/dL (ref 6.0–8.5)

## 2023-06-24 ENCOUNTER — Ambulatory Visit: Payer: Medicare Other | Attending: Family Medicine

## 2023-06-24 DIAGNOSIS — R498 Other voice and resonance disorders: Secondary | ICD-10-CM | POA: Insufficient documentation

## 2023-06-24 NOTE — Therapy (Signed)
OUTPATIENT SPEECH LANGUAGE PATHOLOGY VOICE TREATMENT   Patient Name: Samuel [name] MRN: [id number] DOB:30-May-1956, 67 y.o., male Today's Date: 06/25/2023  PCP: Tally Joe, MD REFERRING PROVIDER: Tally Joe, MD  END OF SESSION:  End of Session - 06/25/23 0927     Visit Number 9    Number of Visits 17    Date for SLP Re-Evaluation 07/21/23    Authorization Type medicare/ BCBS    SLP Start Time [phone]    SLP Stop Time  0930    SLP Time Calculation (min) 41 min    Activity Tolerance Patient tolerated treatment well                    Past Medical History:  Diagnosis Date   Asthma    GERD (gastroesophageal reflux disease)    High cholesterol    under control.    History of blood transfusion    Hypothyroidism    Interstitial lung disease (HCC)    Multiple myeloma (HCC)    Perennial allergic rhinitis    Pneumonia    Sciatic pain, right    Seasonal allergic rhinitis    Thyroid disease    Past Surgical History:  Procedure Laterality Date   BRONCHIAL BIOPSY  06/18/2021   Procedure: BRONCHIAL BIOPSIES;  Surgeon: Leslye Peer, MD;  Location: [facility];  Service: Cardiopulmonary;;   BRONCHIAL BIOPSY  08/28/2021   Procedure: BRONCHIAL BIOPSIES;  Surgeon: Josephine Igo, DO;  Location: [facility];  Service: Cardiopulmonary;;   BRONCHIAL WASHINGS  06/18/2021   Procedure: BRONCHIAL WASHINGS;  Surgeon: Leslye Peer, MD;  Location: [facility];  Service: Cardiopulmonary;;   BRONCHIAL WASHINGS  08/28/2021   Procedure: BRONCHIAL WASHINGS;  Surgeon: Josephine Igo, DO;  Location: [facility];  Service: Cardiopulmonary;;   NASAL SINUS SURGERY     NECK SURGERY  1996   VIDEO BRONCHOSCOPY N/A 06/18/2021   Procedure: VIDEO BRONCHOSCOPY WITH FLUORO;  Surgeon: Leslye Peer, MD;  Location: [facility];  Service: Cardiopulmonary;  Laterality: N/A;   VIDEO BRONCHOSCOPY N/A 08/28/2021   Procedure: VIDEO BRONCHOSCOPY WITH FLUORO;  Surgeon: Josephine Igo, DO;   Location: [facility];  Service: Cardiopulmonary;  Laterality: N/A;   Patient Active Problem List   Diagnosis Date Noted   Coronary artery disease 11/28/2022   Mixed hyperlipidemia 11/28/2022   Recurrent sinusitis 11/04/2022   Acute bacterial rhinosinusitis 09/26/2022   Chronic respiratory failure with hypoxia (HCC) 08/01/2022   S/P bronchoscopy    ILD (interstitial lung disease) (HCC)    Preop cardiovascular exam 08/21/2021   Pneumonitis 07/20/2021   Anxiety 07/20/2021   Interstitial pulmonary disease (HCC) 07/20/2021   Abnormal CT of the chest 06/18/2021   Dyspnea 06/15/2021   Hypoxia 06/15/2021   Multiple myeloma (HCC) 03/05/2021   Multiple myeloma not having achieved remission (HCC) 03/05/2021   Bone lesion 02/14/2021   Monoclonal gammopathy 02/14/2021   High cholesterol    Allergic rhinitis 11/23/2020   Mild persistent asthma 11/23/2020   SOB (shortness of breath) 10/04/2011    Onset date: 05/15/23 (referral date)  REFERRING DIAG: R49.0 (ICD-10-CM) - Dysphonia  THERAPY DIAG: Other voice and resonance disorders  Rationale for Evaluation and Treatment: Rehabilitation  SUBJECTIVE:   SUBJECTIVE STATEMENT: Pt reports feeling confident speaking during a work related meeting after ST. Endorsed fatigue today and chest congestion over the last several days.  Accompanied by: self  PERTINENT HISTORY: "Samuel [name] is a 67 y.o. male who presents as a return patient, for  follow-up of sinusitis and globus sensation. At time of last visit on 03/14/2023, nasal culture was obtained following thick mucoid drainage noted in the right nasal cavity. This was abnormal, and demonstrated gram-positive bacilli. Patient was subsequently treated with a 2-week course of oral antibiotics, and presents today for posttreatment imaging. He has continued to use proton pump inhibitor twice daily, but continues to experience frequent throat clearing and globus sensation. He states he has been working on  dietary and lifestyle modifications as previously instructed, and has been partially successful. He still has difficulty with frequent throat clearing and needing to cough often. He also has not been able to obtain humidification for his oxygen, as the DME company stated they needed a prescription.  Last visit 03/14/2023: Samuel [name] is a 67 y.o. male who presents as a new consult, referred by [name], FNP , for evaluation and treatment of sinus issues, hoarseness, and throat symptoms. He is accompanied by his wife.  Patient had history of multiple myeloma, status post treatment with chemotherapy. He developed interstitial lung disease following chemotherapeutic treatment, and is currently followed closely by pulmonology. He also endorses history of significant environmental allergies, and reports that recent allergy testing showed positivity to "everything.". He was previously on immunotherapy, but this was held during his cancer treatment. He is considering starting back on the chemotherapy due to the significance of his symptoms. He is currently using Singulair, antihistamine and Patanase nasal spray on a daily basis. He also reports using nasal saline irrigations on a daily basis. He reports history of deviated nasal septum repair in 1983. He denies history of sinusitis requiring treatment with oral antibiotics in the last 12 months. His primary symptoms are nasal congestion and postnasal drainage. He denies facial pressure, pain, hyposmia, hypogeusia. He has not had any imaging of his head or sinuses. He does occasionally experience headaches. He uses oxygen on a nightly basis, and states that his oxygen is currently not humidified.  He reports symptoms of silent reflux, and states he is currently taking omeprazole on a daily basis. [name] is frequent throat clearing and globus sensation. He also reports occasional hoarseness."  PAIN:  Are you having pain? No  FALLS: Has patient  fallen in last 6 months? No  LIVING ENVIRONMENT: Lives with: lives with their family Lives in: House/apartment  PLOF:Level of assistance: Independent with ADLs, Independent with IADLs Employment: Full-time employment  PATIENT GOALS: "control my breath, control my speech, and relaxation"   OBJECTIVE:   DIAGNOSTIC FINDINGS: "Nasolaryngoscopy performed at last visit demonstrated moderate interarytenoid edema with otherwise normal upper airway exam. Counseled patient to continue twice daily omeprazole for next 2-3 months. We discussed dietary lifestyle modifications for treatment of reflux to include avoiding spicy and acidic foods which exacerbate reflux, avoiding late meals and snacking, avoiding caffeine and stimulants, avoiding carbonated and alcoholic beverages, adequate oral hydration, avoidance of throat clearing and sleeping with the head of bed elevated. Recommended follow-up with gastroenterology if symptoms do not improve after 3 months of twice daily PPI. Offered referral to speech-language pathology due to continued hoarseness and frequent throat clearing, patient was agreeable, order placed today."   COGNITION: Overall cognitive status: Within functional limits for tasks assessed  SOCIAL HISTORY: Occupation: Full time - Social research officer, government intake: optimal but varies - typically 3x16 oz of water Caffeine/alcohol intake:  None Daily voice use: moderate at work, minimal at home   PERCEPTUAL VOICE ASSESSMENT: Voice quality: hoarse, breathy, rough, low vocal intensity, and vocal  fatigue Vocal abuse: habitual throat clearing, abnormal breathing pattern, and excessive voice use Resonance: normal Respiratory function: clavicular breathing and speaking on residual capacity  OBJECTIVE VOICE ASSESSMENT: Maximum phonation time for sustained "ah": 71 (4-5 seconds max)  Conversational loudness average: 68 dB with intentional  Conversational loudness range: 65-70 dB S/z ratio: 1  but limited seconds (4 seconds) (Suggestive of dysfunction >1.0)  PATIENT REPORTED OUTCOME MEASURES (PROM): Communication Participation Item Bank: 9 "quite a bit" for all situations  but "very much" for getting turn in fast moving conversation  TODAY'S TREATMENT:                                                                                                                                         06-25-23: Pt exhibiting increasingly independent carryover of cough suppression techniques, throat clear alternatives, and diaphragmatic breathing. Occasional non-verbal min A required to reduce upper body tension and use abdominal breath at the conversational level. No coughing evidenced throughout the session, infrequent throat clears followed by a sip of water. Vocal quality and intensity appeared to be impacted by general fatigue; however, able to demonstrate clear voice with SLP model.   06-24-23: Endorsed some overall improvements in coughing and voice. Endorsed significant coughing attack s/p exercise. Deduced coughing attack prompted by reduced intentional breath support and type of exercise (plank). Provided instruction and demo of inhale/exhale on exertion to reduce VCD during exercise and pre-medicating with learned breathing technique prior to initiating conversation and physical movement. Pt only exhibited cough x1 today and no throat clearing evidenced. Pt able to suppress cough/throat clear x3 independently. Demonstrates improved vocal quality with focus on diaphragmatic breathing and awareness of upper body tension in structured conversation.   06/18/23: Demonstrating improved awareness and correction of upper body tension. Minimal throat clearing observed throughout the session. Implementing throat clear alternatives with mod I. Targeted reduction of body tension and increased breath support during structured reading task. Able to reduce upper body tension and use abdominal breathing in sentences and  short paragraphs with occasional fading min A. Intermittent but mild coughing attacks noted with occasional cues required to suppress. Updated HEP to include structured reading practice.   06/17/23: See subjective statement - cues effective to reduce upper body tension and use diaphragmatic breathing when heightened stress exhibite. Suggested setting intent with abdominal breaths x3 prior to answering phone. Rare coughing x2, which subsided with mod A to reduce tension and focus on breathing. Targeted conversation today with intermittent min A required to reduce shoulder tension, focus on abdominal breath with hand on abdomen, and project voice forward. Utilize fidget aid to aid relaxation with mild benefit.   06/11/23: Reported positive affirmations from colleagues re: improved voice quality and decreased coughing. Today, occasional min A required to ID and reduce upper body tension, use breathing techniques to suppress cough, and increase vocal intensity in conversation. Provided recommendations to aid awareness and  mitigation of cough based on initial symptoms and how to manage breath support during transitions periods.   06/09/23: Reported good carryover of trained techniques to suppress cough/throat clearing with benefit endorsed. Pt able to independently reduce/suppress cough/throat clear with ~60% accuracy this session. Occasional min A otherwise needed, mostly to relax upper body. Trialed SOVT and resonant exercises, which were limited d/t breath support and cyclical nature of VCD. Targeted "clear" voice with cue to speak out, with benefit achieved at phrase level. Plan to address next session.   06/06/23: Reviewed cyclical nature of VCD/chronic throat clearing or coughing. SLP answered pt's questions regarding role of reflux. Pt able to teach back strategies to avoid chronic throat clearing: relaxing shoulders, neck, chest, sniff and blow, hard swallow, water sips. Addressed use of strategies during  conversational speech, as pt reporting can be challenging to engage in work meeting/appointments when occurring. Pt demonstrating x1 coughing episode, lasting ~20 seconds, responsive to SLP cues to use water and hard swallow. X6 throat clears, which was minimal compared to instances in which pt demonstrated mod-I employment of alternative. Generated strategy to further address stress in regards to pt perceived trigger for increasing upper body tension.   06-04-23: Pt entered with cough which persisted throughout session. Based on reported s/sx and triggers, SLP suspects chronic cough/VCD. Provided education and handout re: VCD and throat clear alternatives. Trained patient in diaphragmatic breathing and sniff-sniff-blow technique to suppress cough, in which pt able to demo with usual fading to occasional min A. Guided patient in self-analysis of upper body tension given usual tension present in chest/neck. Tension resulted in breath hold during inhale/exhale cycle. Breathing pattern improved with use of trained techniques. Trialed SOVTE, which was impacted by impaired breath support. Will incorporate as able.   05-26-23: Briefly discussed 2 sub-systems of voicing (respiratory and phonatory). Dicussed plan to further target diaphragmatic breathing, forward projection of voice, and reduction in throat clears to optimize vocal quality. Pt verbalized understanding and denied further questions at this time.    PATIENT EDUCATION: Education details: see above  Person educated: Patient and Spouse Education method: Medical illustrator Education comprehension: verbalized understanding, returned demonstration, and needs further education  HOME EXERCISE PROGRAM: Abdominal breathing, forward resonance  GOALS: Goals reviewed with patient? Yes  SHORT TERM GOALS: Target date: 06/23/2023  Pt will reduce throat clearing and coughing by 50% at STG date using trained techniques Baseline: > 9, 6 respectively;  1/0 at STG date Goal status: MET  2.  Pt will employ abdominal breathing during structured tasks with 80% accuracy given occasional min A  Baseline:  Goal status: MET  3.  Pt will employ abdominal breathing during structured conversation with 80% accuracy given occasional min A Baseline:  Goal status: MET  4.  Pt will maintain clear vocal quality in 5-10 minute structured conversations x2 given occasional min A  Baseline:  Goal status: MET    LONG TERM GOALS: Target date: 07/21/2023  Pt will reduce throat clearing and coughing by 75% at LTG date using trained techniques Baseline:  Goal status: IN PROGRESS  2.  Pt will employ abdominal breathing during unstructured conversation with 80% accuracy given rare min A Baseline:  Goal status: IN PROGRESS  3.  Pt will maintain clear vocal quality in 15+ minute unstructured conversations x2 given rare min A  Baseline:  Goal status: IN PROGRESS  4.  Pt will report improved communication effectiveness via PROM by 2 pts at last ST session  Baseline: CPIB=9 Goal  status: IN PROGRESS  ASSESSMENT:  CLINICAL IMPRESSION: Patient is a 67 y.o. M who was seen today for dysphonia. PMHX significant for Multiple Myeloma, interstitial lung disease, and GERD. Conducted ongoing education and instruction of cough suppression techniques, relaxed diaphragmatic breathing, throat clear alternatives, and clear voicing, with improving carryover exhibited. Pt continuing to benefit from skilled ST intervention, such as training vocal hygiene protocol and voice techniques to optimize vocal quality and communication effectiveness.   OBJECTIVE IMPAIRMENTS: include voice disorder. These impairments are limiting patient from effectively communicating at home and in community. Factors affecting potential to achieve goals and functional outcome are co-morbidities. Patient will benefit from skilled SLP services to address above impairments and improve overall  function.  REHAB POTENTIAL: Good  PLAN:  SLP FREQUENCY: 2x/week  SLP DURATION: 8 weeks  PLANNED INTERVENTIONS: Language facilitation, Environmental controls, Cueing hierachy, Internal/external aids, Functional tasks, Multimodal communication approach, SLP instruction and feedback, Compensatory strategies, Patient/family education, and Re-evaluation    Gracy Racer, CCC-SLP 06/25/2023, 11:26 AM

## 2023-06-24 NOTE — Patient Instructions (Addendum)
  We often use Respiratory Muscle Strength Training (RMST) to optimize breath support for our voice patients. I wanted to inquire if you felt like this might be a beneficial option for him as I do not want to exacerbate cough or increase any hypersensitivity. I have attached the research article I found that showed improvements for the VCD/chronic cough population if you are interested.   [email or web address]

## 2023-06-24 NOTE — Therapy (Signed)
OUTPATIENT SPEECH LANGUAGE PATHOLOGY VOICE TREATMENT   Patient Name: Samuel [name] MRN: [id number] DOB:Mar 14, 1956, 67 y.o., male Today's Date: 06/24/2023  PCP: Tally Joe, MD REFERRING PROVIDER: Tally Joe, MD  END OF SESSION:  End of Session - 06/24/23 0930     Visit Number 8    Number of Visits 17    Date for SLP Re-Evaluation 07/21/23    Authorization Type medicare/ BCBS    SLP Start Time 0930    SLP Stop Time  1015    SLP Time Calculation (min) 45 min    Activity Tolerance Patient tolerated treatment well                   Past Medical History:  Diagnosis Date   Asthma    GERD (gastroesophageal reflux disease)    High cholesterol    under control.    History of blood transfusion    Hypothyroidism    Interstitial lung disease (HCC)    Multiple myeloma (HCC)    Perennial allergic rhinitis    Pneumonia    Sciatic pain, right    Seasonal allergic rhinitis    Thyroid disease    Past Surgical History:  Procedure Laterality Date   BRONCHIAL BIOPSY  06/18/2021   Procedure: BRONCHIAL BIOPSIES;  Surgeon: Leslye Peer, MD;  Location: [facility];  Service: Cardiopulmonary;;   BRONCHIAL BIOPSY  08/28/2021   Procedure: BRONCHIAL BIOPSIES;  Surgeon: Josephine Igo, DO;  Location: [facility];  Service: Cardiopulmonary;;   BRONCHIAL WASHINGS  06/18/2021   Procedure: BRONCHIAL WASHINGS;  Surgeon: Leslye Peer, MD;  Location: [facility];  Service: Cardiopulmonary;;   BRONCHIAL WASHINGS  08/28/2021   Procedure: BRONCHIAL WASHINGS;  Surgeon: Josephine Igo, DO;  Location: [facility];  Service: Cardiopulmonary;;   NASAL SINUS SURGERY     NECK SURGERY  1996   VIDEO BRONCHOSCOPY N/A 06/18/2021   Procedure: VIDEO BRONCHOSCOPY WITH FLUORO;  Surgeon: Leslye Peer, MD;  Location: [facility];  Service: Cardiopulmonary;  Laterality: N/A;   VIDEO BRONCHOSCOPY N/A 08/28/2021   Procedure: VIDEO BRONCHOSCOPY WITH FLUORO;  Surgeon: Josephine Igo, DO;   Location: [facility];  Service: Cardiopulmonary;  Laterality: N/A;   Patient Active Problem List   Diagnosis Date Noted   Coronary artery disease 11/28/2022   Mixed hyperlipidemia 11/28/2022   Recurrent sinusitis 11/04/2022   Acute bacterial rhinosinusitis 09/26/2022   Chronic respiratory failure with hypoxia (HCC) 08/01/2022   S/P bronchoscopy    ILD (interstitial lung disease) (HCC)    Preop cardiovascular exam 08/21/2021   Pneumonitis 07/20/2021   Anxiety 07/20/2021   Interstitial pulmonary disease (HCC) 07/20/2021   Abnormal CT of the chest 06/18/2021   Dyspnea 06/15/2021   Hypoxia 06/15/2021   Multiple myeloma (HCC) 03/05/2021   Multiple myeloma not having achieved remission (HCC) 03/05/2021   Bone lesion 02/14/2021   Monoclonal gammopathy 02/14/2021   High cholesterol    Allergic rhinitis 11/23/2020   Mild persistent asthma 11/23/2020   SOB (shortness of breath) 10/04/2011    Onset date: 05/15/23 (referral date)  REFERRING DIAG: R49.0 (ICD-10-CM) - Dysphonia  THERAPY DIAG: Other voice and resonance disorders  Rationale for Evaluation and Treatment: Rehabilitation  SUBJECTIVE:   SUBJECTIVE STATEMENT: "I did good at times. Really good at times. Other times not good. The hardest thing is relaxing but I'm doing better with that." Accompanied by: self  PERTINENT HISTORY: "Samuel [name] is a 67 y.o. male who presents as a return patient, for follow-up  of sinusitis and globus sensation. At time of last visit on 03/14/2023, nasal culture was obtained following thick mucoid drainage noted in the right nasal cavity. This was abnormal, and demonstrated gram-positive bacilli. Patient was subsequently treated with a 2-week course of oral antibiotics, and presents today for posttreatment imaging. He has continued to use proton pump inhibitor twice daily, but continues to experience frequent throat clearing and globus sensation. He states he has been working on dietary and lifestyle  modifications as previously instructed, and has been partially successful. He still has difficulty with frequent throat clearing and needing to cough often. He also has not been able to obtain humidification for his oxygen, as the DME company stated they needed a prescription.  Last visit 03/14/2023: [name] is a 67 y.o. male who presents as a new consult, referred by [name], FNP , for evaluation and treatment of sinus issues, hoarseness, and throat symptoms. He is accompanied by his wife.  Patient had history of multiple myeloma, status post treatment with chemotherapy. He developed interstitial lung disease following chemotherapeutic treatment, and is currently followed closely by pulmonology. He also endorses history of significant environmental allergies, and reports that recent allergy testing showed positivity to "everything.". He was previously on immunotherapy, but this was held during his cancer treatment. He is considering starting back on the chemotherapy due to the significance of his symptoms. He is currently using Singulair, antihistamine and Patanase nasal spray on a daily basis. He also reports using nasal saline irrigations on a daily basis. He reports history of deviated nasal septum repair in 1983. He denies history of sinusitis requiring treatment with oral antibiotics in the last 12 months. His primary symptoms are nasal congestion and postnasal drainage. He denies facial pressure, pain, hyposmia, hypogeusia. He has not had any imaging of his head or sinuses. He does occasionally experience headaches. He uses oxygen on a nightly basis, and states that his oxygen is currently not humidified.  He reports symptoms of silent reflux, and states he is currently taking omeprazole on a daily basis. [name] is frequent throat clearing and globus sensation. He also reports occasional hoarseness."  PAIN:  Are you having pain? No  FALLS: Has patient fallen in last 6 months?  No  LIVING ENVIRONMENT: Lives with: lives with their family Lives in: House/apartment  PLOF:Level of assistance: Independent with ADLs, Independent with IADLs Employment: Full-time employment  PATIENT GOALS: "control my breath, control my speech, and relaxation"   OBJECTIVE:   DIAGNOSTIC FINDINGS: "Nasolaryngoscopy performed at last visit demonstrated moderate interarytenoid edema with otherwise normal upper airway exam. Counseled patient to continue twice daily omeprazole for next 2-3 months. We discussed dietary lifestyle modifications for treatment of reflux to include avoiding spicy and acidic foods which exacerbate reflux, avoiding late meals and snacking, avoiding caffeine and stimulants, avoiding carbonated and alcoholic beverages, adequate oral hydration, avoidance of throat clearing and sleeping with the head of bed elevated. Recommended follow-up with gastroenterology if symptoms do not improve after 3 months of twice daily PPI. Offered referral to speech-language pathology due to continued hoarseness and frequent throat clearing, patient was agreeable, order placed today."   COGNITION: Overall cognitive status: Within functional limits for tasks assessed  SOCIAL HISTORY: Occupation: Full time - Social research officer, government intake: optimal but varies - typically 3x16 oz of water Caffeine/alcohol intake:  None Daily voice use: moderate at work, minimal at home   PERCEPTUAL VOICE ASSESSMENT: Voice quality: hoarse, breathy, rough, low vocal intensity, and vocal fatigue  Vocal abuse: habitual throat clearing, abnormal breathing pattern, and excessive voice use Resonance: normal Respiratory function: clavicular breathing and speaking on residual capacity  OBJECTIVE VOICE ASSESSMENT: Maximum phonation time for sustained "ah": 71 (4-5 seconds max)  Conversational loudness average: 68 dB with intentional  Conversational loudness range: 65-70 dB S/z ratio: 1 but limited seconds (4  seconds) (Suggestive of dysfunction >1.0)  PATIENT REPORTED OUTCOME MEASURES (PROM): Communication Participation Item Bank: 9 "quite a bit" for all situations  but "very much" for getting turn in fast moving conversation  TODAY'S TREATMENT:                                                                                                                                         06-24-23: Endorsed some overall improvements in coughing and voice. Endorsed significant coughing attack s/p exercise. Deduced coughing attack prompted by reduced intentional breath support and type of exercise (plank). Provided instruction and demo of inhale/exhale on exertion to reduce VCD during exercise and pre-medicating with learned breathing technique prior to initiating conversation and physical movement. Pt only exhibited cough x1 today and no throat clearing evidenced. Pt able to suppress cough/throat clear x3 independently. Demonstrates improved vocal quality with focus on diaphragmatic breathing and awareness of upper body tension in structured conversation.   06/18/23: Demonstrating improved awareness and correction of upper body tension. Minimal throat clearing observed throughout the session. Implementing throat clear alternatives with mod I. Targeted reduction of body tension and increased breath support during structured reading task. Able to reduce upper body tension and use abdominal breathing in sentences and short paragraphs with occasional fading min A. Intermittent but mild coughing attacks noted with occasional cues required to suppress. Updated HEP to include structured reading practice.   06/17/23: See subjective statement - cues effective to reduce upper body tension and use diaphragmatic breathing when heightened stress exhibite. Suggested setting intent with abdominal breaths x3 prior to answering phone. Rare coughing x2, which subsided with mod A to reduce tension and focus on breathing. Targeted conversation  today with intermittent min A required to reduce shoulder tension, focus on abdominal breath with hand on abdomen, and project voice forward. Utilize fidget aid to aid relaxation with mild benefit.   06/11/23: Reported positive affirmations from colleagues re: improved voice quality and decreased coughing. Today, occasional min A required to ID and reduce upper body tension, use breathing techniques to suppress cough, and increase vocal intensity in conversation. Provided recommendations to aid awareness and mitigation of cough based on initial symptoms and how to manage breath support during transitions periods.   06/09/23: Reported good carryover of trained techniques to suppress cough/throat clearing with benefit endorsed. Pt able to independently reduce/suppress cough/throat clear with ~60% accuracy this session. Occasional min A otherwise needed, mostly to relax upper body. Trialed SOVT and resonant exercises, which were limited d/t breath support and cyclical nature of VCD. Targeted "clear"  voice with cue to speak out, with benefit achieved at phrase level. Plan to address next session.   06/06/23: Reviewed cyclical nature of VCD/chronic throat clearing or coughing. SLP answered pt's questions regarding role of reflux. Pt able to teach back strategies to avoid chronic throat clearing: relaxing shoulders, neck, chest, sniff and blow, hard swallow, water sips. Addressed use of strategies during conversational speech, as pt reporting can be challenging to engage in work meeting/appointments when occurring. Pt demonstrating x1 coughing episode, lasting ~20 seconds, responsive to SLP cues to use water and hard swallow. X6 throat clears, which was minimal compared to instances in which pt demonstrated mod-I employment of alternative. Generated strategy to further address stress in regards to pt perceived trigger for increasing upper body tension.   06-04-23: Pt entered with cough which persisted throughout  session. Based on reported s/sx and triggers, SLP suspects chronic cough/VCD. Provided education and handout re: VCD and throat clear alternatives. Trained patient in diaphragmatic breathing and sniff-sniff-blow technique to suppress cough, in which pt able to demo with usual fading to occasional min A. Guided patient in self-analysis of upper body tension given usual tension present in chest/neck. Tension resulted in breath hold during inhale/exhale cycle. Breathing pattern improved with use of trained techniques. Trialed SOVTE, which was impacted by impaired breath support. Will incorporate as able.   05-26-23: Briefly discussed 2 sub-systems of voicing (respiratory and phonatory). Dicussed plan to further target diaphragmatic breathing, forward projection of voice, and reduction in throat clears to optimize vocal quality. Pt verbalized understanding and denied further questions at this time.    PATIENT EDUCATION: Education details: see above  Person educated: Patient and Spouse Education method: Medical illustrator Education comprehension: verbalized understanding, returned demonstration, and needs further education  HOME EXERCISE PROGRAM: Abdominal breathing, forward resonance  GOALS: Goals reviewed with patient? Yes  SHORT TERM GOALS: Target date: 06/23/2023  Pt will reduce throat clearing and coughing by 50% at STG date using trained techniques Baseline: > 9, 6 respectively; 1/0 at STG date Goal status: MET  2.  Pt will employ abdominal breathing during structured tasks with 80% accuracy given occasional min A  Baseline:  Goal status: MET  3.  Pt will employ abdominal breathing during structured conversation with 80% accuracy given occasional min A Baseline:  Goal status: MET  4.  Pt will maintain clear vocal quality in 5-10 minute structured conversations x2 given occasional min A  Baseline:  Goal status: MET    LONG TERM GOALS: Target date: 07/21/2023  Pt will  reduce throat clearing and coughing by 75% at LTG date using trained techniques Baseline:  Goal status: IN PROGRESS  2.  Pt will employ abdominal breathing during unstructured conversation with 80% accuracy given rare min A Baseline:  Goal status: IN PROGRESS  3.  Pt will maintain clear vocal quality in 15+ minute unstructured conversations x2 given rare min A  Baseline:  Goal status: IN PROGRESS  4.  Pt will report improved communication effectiveness via PROM by 2 pts at last ST session  Baseline: CPIB=9 Goal status: IN PROGRESS  ASSESSMENT:  CLINICAL IMPRESSION: Patient is a 67 y.o. M who was seen today for dysphonia. PMHX significant for Multiple Myeloma, interstitial lung disease, and GERD. Conducted ongoing education and instruction of cough suppression techniques, relaxed diaphragmatic breathing, throat clear alternatives, and clear voicing, with improving carryover exhibited. Pt would benefit from skilled ST intervention to train vocal hygiene protocol and voice techniques to optimize vocal quality and  communication effectiveness.   OBJECTIVE IMPAIRMENTS: include voice disorder. These impairments are limiting patient from effectively communicating at home and in community. Factors affecting potential to achieve goals and functional outcome are co-morbidities. Patient will benefit from skilled SLP services to address above impairments and improve overall function.  REHAB POTENTIAL: Good  PLAN:  SLP FREQUENCY: 2x/week  SLP DURATION: 8 weeks  PLANNED INTERVENTIONS: Language facilitation, Environmental controls, Cueing hierachy, Internal/external aids, Functional tasks, Multimodal communication approach, SLP instruction and feedback, Compensatory strategies, Patient/family education, and Re-evaluation    Gracy Racer, CCC-SLP 06/24/2023, 10:35 AM

## 2023-06-25 ENCOUNTER — Ambulatory Visit: Payer: Medicare Other

## 2023-06-25 DIAGNOSIS — R498 Other voice and resonance disorders: Secondary | ICD-10-CM

## 2023-07-01 ENCOUNTER — Ambulatory Visit (HOSPITAL_COMMUNITY): Payer: Medicare Other | Attending: Internal Medicine

## 2023-07-01 DIAGNOSIS — R0609 Other forms of dyspnea: Secondary | ICD-10-CM | POA: Insufficient documentation

## 2023-07-01 LAB — ECHOCARDIOGRAM COMPLETE
Area-P 1/2: 2.23 cm2
S' Lateral: 1.9 cm

## 2023-07-01 NOTE — Therapy (Unsigned)
OUTPATIENT SPEECH LANGUAGE PATHOLOGY VOICE TREATMENT   Patient Name: Samuel [name] MRN: [id number] DOB:1955-11-13, 67 y.o., male Today's Date: 07/01/2023  PCP: Tally Joe, MD REFERRING PROVIDER: Tally Joe, MD  END OF SESSION:           Past Medical History:  Diagnosis Date   Asthma    GERD (gastroesophageal reflux disease)    High cholesterol    under control.    History of blood transfusion    Hypothyroidism    Interstitial lung disease (HCC)    Multiple myeloma (HCC)    Perennial allergic rhinitis    Pneumonia    Sciatic pain, right    Seasonal allergic rhinitis    Thyroid disease    Past Surgical History:  Procedure Laterality Date   BRONCHIAL BIOPSY  06/18/2021   Procedure: BRONCHIAL BIOPSIES;  Surgeon: Leslye Peer, MD;  Location: [facility];  Service: Cardiopulmonary;;   BRONCHIAL BIOPSY  08/28/2021   Procedure: BRONCHIAL BIOPSIES;  Surgeon: Josephine Igo, DO;  Location: [facility];  Service: Cardiopulmonary;;   BRONCHIAL WASHINGS  06/18/2021   Procedure: BRONCHIAL WASHINGS;  Surgeon: Leslye Peer, MD;  Location: [facility];  Service: Cardiopulmonary;;   BRONCHIAL WASHINGS  08/28/2021   Procedure: BRONCHIAL WASHINGS;  Surgeon: Josephine Igo, DO;  Location: [facility];  Service: Cardiopulmonary;;   NASAL SINUS SURGERY     NECK SURGERY  1996   VIDEO BRONCHOSCOPY N/A 06/18/2021   Procedure: VIDEO BRONCHOSCOPY WITH FLUORO;  Surgeon: Leslye Peer, MD;  Location: [facility];  Service: Cardiopulmonary;  Laterality: N/A;   VIDEO BRONCHOSCOPY N/A 08/28/2021   Procedure: VIDEO BRONCHOSCOPY WITH FLUORO;  Surgeon: Josephine Igo, DO;  Location: [facility];  Service: Cardiopulmonary;  Laterality: N/A;   Patient Active Problem List   Diagnosis Date Noted   Coronary artery disease 11/28/2022   Mixed hyperlipidemia 11/28/2022   Recurrent sinusitis 11/04/2022   Acute bacterial rhinosinusitis 09/26/2022   Chronic respiratory failure with  hypoxia (HCC) 08/01/2022   S/P bronchoscopy    ILD (interstitial lung disease) (HCC)    Preop cardiovascular exam 08/21/2021   Pneumonitis 07/20/2021   Anxiety 07/20/2021   Interstitial pulmonary disease (HCC) 07/20/2021   Abnormal CT of the chest 06/18/2021   Dyspnea 06/15/2021   Hypoxia 06/15/2021   Multiple myeloma (HCC) 03/05/2021   Multiple myeloma not having achieved remission (HCC) 03/05/2021   Bone lesion 02/14/2021   Monoclonal gammopathy 02/14/2021   High cholesterol    Allergic rhinitis 11/23/2020   Mild persistent asthma 11/23/2020   SOB (shortness of breath) 10/04/2011    Onset date: 05/15/23 (referral date)  REFERRING DIAG: R49.0 (ICD-10-CM) - Dysphonia  THERAPY DIAG: No diagnosis found.  Rationale for Evaluation and Treatment: Rehabilitation  SUBJECTIVE:   SUBJECTIVE STATEMENT:  Accompanied by: self  PERTINENT HISTORY: "Samuel [name] is a 67 y.o. male who presents as a return patient, for follow-up of sinusitis and globus sensation. At time of last visit on 03/14/2023, nasal culture was obtained following thick mucoid drainage noted in the right nasal cavity. This was abnormal, and demonstrated gram-positive bacilli. Patient was subsequently treated with a 2-week course of oral antibiotics, and presents today for posttreatment imaging. He has continued to use proton pump inhibitor twice daily, but continues to experience frequent throat clearing and globus sensation. He states he has been working on dietary and lifestyle modifications as previously instructed, and has been partially successful. He still has difficulty with frequent throat clearing and needing to cough often.  He also has not been able to obtain humidification for his oxygen, as the DME company stated they needed a prescription.  Last visit 03/14/2023: Samuel [name] is a 67 y.o. male who presents as a new consult, referred by [name], FNP , for evaluation and treatment of sinus issues,  hoarseness, and throat symptoms. He is accompanied by his wife.  Patient had history of multiple myeloma, status post treatment with chemotherapy. He developed interstitial lung disease following chemotherapeutic treatment, and is currently followed closely by pulmonology. He also endorses history of significant environmental allergies, and reports that recent allergy testing showed positivity to "everything.". He was previously on immunotherapy, but this was held during his cancer treatment. He is considering starting back on the chemotherapy due to the significance of his symptoms. He is currently using Singulair, antihistamine and Patanase nasal spray on a daily basis. He also reports using nasal saline irrigations on a daily basis. He reports history of deviated nasal septum repair in 1983. He denies history of sinusitis requiring treatment with oral antibiotics in the last 12 months. His primary symptoms are nasal congestion and postnasal drainage. He denies facial pressure, pain, hyposmia, hypogeusia. He has not had any imaging of his head or sinuses. He does occasionally experience headaches. He uses oxygen on a nightly basis, and states that his oxygen is currently not humidified.  He reports symptoms of silent reflux, and states he is currently taking omeprazole on a daily basis. [name] is frequent throat clearing and globus sensation. He also reports occasional hoarseness."  PAIN:  Are you having pain? No  FALLS: Has patient fallen in last 6 months? No  LIVING ENVIRONMENT: Lives with: lives with their family Lives in: House/apartment  PLOF:Level of assistance: Independent with ADLs, Independent with IADLs Employment: Full-time employment  PATIENT GOALS: "control my breath, control my speech, and relaxation"   OBJECTIVE:   DIAGNOSTIC FINDINGS: "Nasolaryngoscopy performed at last visit demonstrated moderate interarytenoid edema with otherwise normal upper airway exam. Counseled patient to  continue twice daily omeprazole for next 2-3 months. We discussed dietary lifestyle modifications for treatment of reflux to include avoiding spicy and acidic foods which exacerbate reflux, avoiding late meals and snacking, avoiding caffeine and stimulants, avoiding carbonated and alcoholic beverages, adequate oral hydration, avoidance of throat clearing and sleeping with the head of bed elevated. Recommended follow-up with gastroenterology if symptoms do not improve after 3 months of twice daily PPI. Offered referral to speech-language pathology due to continued hoarseness and frequent throat clearing, patient was agreeable, order placed today."   COGNITION: Overall cognitive status: Within functional limits for tasks assessed  SOCIAL HISTORY: Occupation: Full time Agricultural engineer intake: optimal but varies - typically 3x16 oz of water Caffeine/alcohol intake:  None Daily voice use: moderate at work, minimal at home   PERCEPTUAL VOICE ASSESSMENT: Voice quality: hoarse, breathy, rough, low vocal intensity, and vocal fatigue Vocal abuse: habitual throat clearing, abnormal breathing pattern, and excessive voice use Resonance: normal Respiratory function: clavicular breathing and speaking on residual capacity  OBJECTIVE VOICE ASSESSMENT: Maximum phonation time for sustained "ah": 71 (4-5 seconds max)  Conversational loudness average: 68 dB with intentional  Conversational loudness range: 65-70 dB S/z ratio: 1 but limited seconds (4 seconds) (Suggestive of dysfunction >1.0)  PATIENT REPORTED OUTCOME MEASURES (PROM): Communication Participation Item Bank: 9 "quite a bit" for all situations  but "very much" for getting turn in fast moving conversation  TODAY'S TREATMENT:  07-02-23:  06-25-23: Pt exhibiting increasingly independent carryover of cough  suppression techniques, throat clear alternatives, and diaphragmatic breathing. Occasional non-verbal min A required to reduce upper body tension and use abdominal breath at the conversational level. No coughing evidenced throughout the session, infrequent throat clears followed by a sip of water. Vocal quality and intensity appeared to be impacted by general fatigue; however, able to demonstrate clear voice with SLP model.   06-24-23: Endorsed some overall improvements in coughing and voice. Endorsed significant coughing attack s/p exercise. Deduced coughing attack prompted by reduced intentional breath support and type of exercise (plank). Provided instruction and demo of inhale/exhale on exertion to reduce VCD during exercise and pre-medicating with learned breathing technique prior to initiating conversation and physical movement. Pt only exhibited cough x1 today and no throat clearing evidenced. Pt able to suppress cough/throat clear x3 independently. Demonstrates improved vocal quality with focus on diaphragmatic breathing and awareness of upper body tension in structured conversation.   06/18/23: Demonstrating improved awareness and correction of upper body tension. Minimal throat clearing observed throughout the session. Implementing throat clear alternatives with mod I. Targeted reduction of body tension and increased breath support during structured reading task. Able to reduce upper body tension and use abdominal breathing in sentences and short paragraphs with occasional fading min A. Intermittent but mild coughing attacks noted with occasional cues required to suppress. Updated HEP to include structured reading practice.   06/17/23: See subjective statement - cues effective to reduce upper body tension and use diaphragmatic breathing when heightened stress exhibite. Suggested setting intent with abdominal breaths x3 prior to answering phone. Rare coughing x2, which subsided with mod A to reduce  tension and focus on breathing. Targeted conversation today with intermittent min A required to reduce shoulder tension, focus on abdominal breath with hand on abdomen, and project voice forward. Utilize fidget aid to aid relaxation with mild benefit.   06/11/23: Reported positive affirmations from colleagues re: improved voice quality and decreased coughing. Today, occasional min A required to ID and reduce upper body tension, use breathing techniques to suppress cough, and increase vocal intensity in conversation. Provided recommendations to aid awareness and mitigation of cough based on initial symptoms and how to manage breath support during transitions periods.   06/09/23: Reported good carryover of trained techniques to suppress cough/throat clearing with benefit endorsed. Pt able to independently reduce/suppress cough/throat clear with ~60% accuracy this session. Occasional min A otherwise needed, mostly to relax upper body. Trialed SOVT and resonant exercises, which were limited d/t breath support and cyclical nature of VCD. Targeted "clear" voice with cue to speak out, with benefit achieved at phrase level. Plan to address next session.   06/06/23: Reviewed cyclical nature of VCD/chronic throat clearing or coughing. SLP answered pt's questions regarding role of reflux. Pt able to teach back strategies to avoid chronic throat clearing: relaxing shoulders, neck, chest, sniff and blow, hard swallow, water sips. Addressed use of strategies during conversational speech, as pt reporting can be challenging to engage in work meeting/appointments when occurring. Pt demonstrating x1 coughing episode, lasting ~20 seconds, responsive to SLP cues to use water and hard swallow. X6 throat clears, which was minimal compared to instances in which pt demonstrated mod-I employment of alternative. Generated strategy to further address stress in regards to pt perceived trigger for increasing upper body tension.   06-04-23:  Pt entered with cough which persisted throughout session. Based on reported s/sx and triggers, SLP suspects chronic cough/VCD. Provided education and handout  re: VCD and throat clear alternatives. Trained patient in diaphragmatic breathing and sniff-sniff-blow technique to suppress cough, in which pt able to demo with usual fading to occasional min A. Guided patient in self-analysis of upper body tension given usual tension present in chest/neck. Tension resulted in breath hold during inhale/exhale cycle. Breathing pattern improved with use of trained techniques. Trialed SOVTE, which was impacted by impaired breath support. Will incorporate as able.   05-26-23: Briefly discussed 2 sub-systems of voicing (respiratory and phonatory). Dicussed plan to further target diaphragmatic breathing, forward projection of voice, and reduction in throat clears to optimize vocal quality. Pt verbalized understanding and denied further questions at this time.    PATIENT EDUCATION: Education details: see above  Person educated: Patient and Spouse Education method: Medical illustrator Education comprehension: verbalized understanding, returned demonstration, and needs further education  HOME EXERCISE PROGRAM: Abdominal breathing, forward resonance  GOALS: Goals reviewed with patient? Yes  SHORT TERM GOALS: Target date: 06/23/2023  Pt will reduce throat clearing and coughing by 50% at STG date using trained techniques Baseline: > 9, 6 respectively; 1/0 at STG date Goal status: MET  2.  Pt will employ abdominal breathing during structured tasks with 80% accuracy given occasional min A  Baseline:  Goal status: MET  3.  Pt will employ abdominal breathing during structured conversation with 80% accuracy given occasional min A Baseline:  Goal status: MET  4.  Pt will maintain clear vocal quality in 5-10 minute structured conversations x2 given occasional min A  Baseline:  Goal status: MET    LONG  TERM GOALS: Target date: 07/21/2023  Pt will reduce throat clearing and coughing by 75% at LTG date using trained techniques Baseline:  Goal status: IN PROGRESS  2.  Pt will employ abdominal breathing during unstructured conversation with 80% accuracy given rare min A Baseline:  Goal status: IN PROGRESS  3.  Pt will maintain clear vocal quality in 15+ minute unstructured conversations x2 given rare min A  Baseline:  Goal status: IN PROGRESS  4.  Pt will report improved communication effectiveness via PROM by 2 pts at last ST session  Baseline: CPIB=9 Goal status: IN PROGRESS  ASSESSMENT:  CLINICAL IMPRESSION: Patient is a 67 y.o. M who was seen today for dysphonia. PMHX significant for Multiple Myeloma, interstitial lung disease, and GERD. Conducted ongoing education and instruction of cough suppression techniques, relaxed diaphragmatic breathing, throat clear alternatives, and clear voicing, with improving carryover exhibited. Pt continuing to benefit from skilled ST intervention, such as training vocal hygiene protocol and voice techniques to optimize vocal quality and communication effectiveness.   OBJECTIVE IMPAIRMENTS: include voice disorder. These impairments are limiting patient from effectively communicating at home and in community. Factors affecting potential to achieve goals and functional outcome are co-morbidities. Patient will benefit from skilled SLP services to address above impairments and improve overall function.  REHAB POTENTIAL: Good  PLAN:  SLP FREQUENCY: 2x/week  SLP DURATION: 8 weeks  PLANNED INTERVENTIONS: Language facilitation, Environmental controls, Cueing hierachy, Internal/external aids, Functional tasks, Multimodal communication approach, SLP instruction and feedback, Compensatory strategies, Patient/family education, and Re-evaluation    Gracy Racer, CCC-SLP 07/01/2023, 12:15 PM

## 2023-07-02 ENCOUNTER — Ambulatory Visit: Payer: Medicare Other

## 2023-07-02 DIAGNOSIS — R498 Other voice and resonance disorders: Secondary | ICD-10-CM

## 2023-07-02 NOTE — Progress Notes (Signed)
Mild grade 1 heart muscle stiffness . Otherwiwse normal./ Please follow this during your cardiology visit

## 2023-07-02 NOTE — Patient Instructions (Addendum)
Relax Breathing Clear, projected voice Repeat cycle  Loud "ahhh" x5-10 (strong and long as you can, stop if you lose good quality or air)  Loud "ahh" low pitch, mid pitch, high pitch x5  Loud "ahh" high pitch, mid pitch, low pitch x5  Take a deep breath Wide open mouth  Send voice forward (project) Stop if you run out of air

## 2023-07-04 ENCOUNTER — Encounter: Payer: Self-pay | Admitting: Cardiology

## 2023-07-04 ENCOUNTER — Ambulatory Visit: Payer: Medicare Other | Admitting: Cardiology

## 2023-07-04 VITALS — BP 148/87 | HR 54 | Resp 17 | Ht 70.0 in | Wt 203.8 lb

## 2023-07-04 DIAGNOSIS — I10 Essential (primary) hypertension: Secondary | ICD-10-CM | POA: Insufficient documentation

## 2023-07-04 DIAGNOSIS — I2510 Atherosclerotic heart disease of native coronary artery without angina pectoris: Secondary | ICD-10-CM

## 2023-07-04 DIAGNOSIS — E782 Mixed hyperlipidemia: Secondary | ICD-10-CM

## 2023-07-04 MED ORDER — EZETIMIBE-SIMVASTATIN 10-40 MG PO TABS: 1.0000 | ORAL_TABLET | Freq: Every day | ORAL | 3 refills | Status: DC

## 2023-07-04 MED ORDER — ASPIRIN 81 MG PO TBEC: 81.0000 mg | DELAYED_RELEASE_TABLET | Freq: Every day | ORAL | 3 refills | Status: DC

## 2023-07-04 NOTE — Progress Notes (Signed)
Patient referred by Tally Joe, MD for cardiac risk stratification  Subjective:   Samuel [name], male    DOB: 07-Sep-1956, 67 y.o.   MRN: [id number]   Chief Complaint  Patient presents with   Coronary artery disease involving native coronary artery of     HPI  67 y/o Caucasian male with multiple myeloma, interstitial lung disease, drug-induced pneumonitis, coronary calcification  Reviewed recent test results with the patient, details below.  Dyspnea persists, but denies any orthopnea, PND, leg edema symptoms.  He has regular follow-up with hematology/oncology and pulmonology.  He stopped rosuvastatin due to myalgias, is back on ezetimibe rosuvastatin.  Blood pressure elevated today, generally well-controlled.  Initial consultation visit 08/2021: Patient is going to undergo lung biopsy under general anesthesia. he was referred for cardiac risk stratification prior to the same.  Patient is here with his wife today.  He is a Firefighter, continues to work regularly.  He has been fairly active up until May 2022.  He was treated with [name] for multiple myeloma, with which his multiple myeloma is reportedly remission.  However, he developed relatively rapid onset of severe central dyspnea.  He has been seeing Dr. Marchelle Gearing since then with working diagnosis of interstitial lung disease versus drug-induced pneumonitis.  His CT scan has consistently showed bilateral groundglass opacities with peribronchovascular reticular opacities, traction bronchiectasis.  Differential reported on CT chest variance sequela of her acute lung injury, NSIP, or fibrotic HP.   There has been incidental findings of aortic calcification in LAD and RCA calcification.  Patient denies any chest pain symptoms.  Prior to May 2022, he did not have any significant exertional dyspnea.  BMP and NT proBNP has been normal the last few months.  Troponin was also normal.  He smoked in college, none since then.  His  father did have history of coronary artery disease requiring CABG.  Patient is currently on aspirin and statin multiple control lipids.     Current Outpatient Medications:    acyclovir (ZOVIRAX) 400 MG tablet, Take 1 tablet (400 mg total) by mouth 2 (two) times daily., Disp: 60 tablet, Rfl: 3   albuterol (PROVENTIL) (2.5 MG/3ML) 0.083% nebulizer solution, Take 3 mLs (2.5 mg total) by nebulization every 6 (six) hours as needed for wheezing or shortness of breath., Disp: 120 mL, Rfl: 12   albuterol (VENTOLIN HFA) 108 (90 Base) MCG/ACT inhaler, Inhale 2 puffs into the lungs every 6 hours as needed for wheezing or shortness of breath., Disp: 8.5 g, Rfl: 1   ALPRAZolam (XANAX) 0.5 MG tablet, Take 0.5 mg by mouth at bedtime., Disp: , Rfl:    ASSESS FULL RANGE PEAK METER DEVI, as directed., Disp: , Rfl:    azelastine (ASTELIN) 0.1 % nasal spray, Place 2 sprays into both nostrils 2 (two) times daily. Use in each nostril as directed, Disp: 30 mL, Rfl: 5   bisoprolol (ZEBETA) 5 MG tablet, Take 1 tablet (5 mg total) by mouth daily., Disp: 30 tablet, Rfl: 2   Budeson-Glycopyrrol-Formoterol (BREZTRI AEROSPHERE) 160-9-4.8 MCG/ACT AERO, Inhale 2 puffs into the lungs in the morning and at bedtime., Disp: 10.7 g, Rfl: 5   cholecalciferol (VITAMIN D) 1000 UNITS tablet, Take 3,000 Units by mouth daily., Disp: , Rfl:    Dextromethorphan HBr (DELSYM PO), Take by mouth as needed. For cough, Disp: , Rfl:    dextromethorphan-guaiFENesin (MUCINEX DM) 30-600 MG 12hr tablet, Take 1 tablet by mouth 2 (two) times daily., Disp: , Rfl:  EPINEPHrine (EPI-PEN) 0.3 mg/0.3 mL DEVI, Inject 0.3 mg into the muscle as needed., Disp: , Rfl:    ezetimibe-simvastatin (VYTORIN) 10-40 MG tablet, Take 1 tablet by mouth daily., Disp: , Rfl:    fluticasone (FLONASE) 50 MCG/ACT nasal spray, 1 spray., Disp: , Rfl:    Guaifenesin 1200 MG TB12, 1 tablet as needed Orally every 12 hrs for 5 days, Disp: , Rfl:    hydrOXYzine (ATARAX) 25 MG  tablet, Take 25 mg by mouth every 8 (eight) hours as needed., Disp: , Rfl:    ketoconazole (NIZORAL) 2 % cream, SMARTSIG:1 Application Topical 1 to 2 Times Daily, Disp: , Rfl:    levothyroxine (SYNTHROID) 50 MCG tablet, Take 50 mcg by mouth daily before breakfast., Disp: , Rfl:    loratadine (CLARITIN) 10 MG tablet, Take 10 mg by mouth daily., Disp: , Rfl:    montelukast (SINGULAIR) 10 MG tablet, Take 10 mg by mouth at bedtime., Disp: , Rfl:    Multiple Vitamin (MULTIVITAMIN) tablet, Take 1 tablet by mouth daily., Disp: , Rfl:    omeprazole (PRILOSEC) 40 MG capsule, Take 1 capsule (40 mg total) by mouth 2 (two) times daily. Take 30 minutes before a meal, Disp: 60 capsule, Rfl: 3   OXYGEN, Inhale 2 L into the lungs continuous. As needed, Disp: , Rfl:    sildenafil (REVATIO) 20 MG tablet, Take 20 mg by mouth daily as needed (ED)., Disp: , Rfl:    Simethicone (SIMETHICONE ULTRA STRENGTH) 180 MG CAPS, Take 1 capsule (180 mg total) by mouth 3 (three) times daily as needed., Disp: 90 capsule, Rfl: 0   sodium chloride (OCEAN) 0.65 % SOLN nasal spray, Place 1 spray into both nostrils as needed for congestion., Disp: , Rfl:    tamsulosin (FLOMAX) 0.4 MG CAPS capsule, Take 0.4 mg by mouth daily., Disp: , Rfl:    thyroid (ARMOUR) 120 MG tablet, Take 120 mg by mouth daily before breakfast., Disp: , Rfl:    triamcinolone cream (KENALOG) 0.1 %, Apply 1 application topically daily as needed (sun burn itch)., Disp: , Rfl:    Turmeric 400 MG CAPS, 1 capsule Orally once a day, Disp: , Rfl:   Cardiovascular and other pertinent studies:  EK 07/04/2023: Sinus rhythm 53 bpm LVH  Echocardiogram 07/01/2023:  1. Left ventricular ejection fraction, by estimation, is 70 to 75%. The  left ventricle has hyperdynamic function. The left ventricle has no  regional wall motion abnormalities. Left ventricular diastolic parameters  are consistent with Grade I diastolic  dysfunction (impaired relaxation).   2. Right  ventricular systolic function is normal. The right ventricular  size is normal.   3. Left atrial size was mildly dilated.   4. Right atrial size was mildly dilated.   5. The mitral valve is normal in structure. Trivial mitral valve  regurgitation. No evidence of mitral stenosis.   6. The aortic valve is tricuspid. Aortic valve regurgitation is not  visualized. Aortic valve sclerosis is present, with no evidence of aortic  valve stenosis.   Lexiscan Tetrofosmin stress test 12/10/2022: 1 Day Rest/Stress protocol.  Stress EKG is non-diagnostic, as this is pharmacological stress test using Lexiscan. Normal myocardial perfusion without convincing evidence of reversible myocardial ischemia or prior infarct. Left ventricular size normal, thickness preserved, no regional wall motion abnormalities. No prior studies available for comparison. Low risk study.  CT Chest 12/05/2022: 1. Diffuse reticular opacities with associated traction bronchiectasis. No definite evidence of progression. Fibrotic fibrotic hypersensitivity is a possibility, although there  is only mild air trapping. Diffuse ground-glass opacities described on July 30, 2021 and June 15, 2021 priors exam no longer apparent and favored to be due to acute etiology. Findings are indeterminate for UIP per consensus guidelines: Diagnosis of Idiopathic Pulmonary Fibrosis: An Official ATS/ERS/JRS/ALAT Clinical Practice Guideline. Am Rosezetta Schlatter Crit Care Med Vol 198, Iss 5, [phone], Jun 21 2017. 2. Moderate to severe coronary artery calcifications. 3. Unchanged mildly enlarged mediastinal and hilar lymph nodes, likely reactive. 4. Aortic Atherosclerosis (ICD10-I70.0).   CT Chest 11/25/2022: 1. Diffuse reticular opacities with associated traction bronchiectasis. No definite evidence of progression. Fibrotic fibrotic hypersensitivity is a possibility, although there is only mild air trapping. Diffuse ground-glass opacities described  on July 30, 2021 and June 15, 2021 priors exam no longer apparent and favored to be due to acute etiology. Findings are indeterminate for UIP per consensus guidelines: Diagnosis of Idiopathic Pulmonary Fibrosis: An Official ATS/ERS/JRS/ALAT Clinical Practice Guideline. Am Rosezetta Schlatter Crit Care Med Vol 198, Iss 5, [phone], Jun 21 2017. 2. Moderate to severe coronary artery calcifications. 3. Unchanged mildly enlarged mediastinal and hilar lymph nodes, likely reactive. 4. Aortic Atherosclerosis (ICD10-I70.0).  Echocardiogram 06/16/2021:  1. Left ventricular ejection fraction, by estimation, is 65 to 70%. The  left ventricle has normal function. The left ventricle has no regional  wall motion abnormalities. There is mild left ventricular hypertrophy.  Left ventricular diastolic parameters are indeterminate.  Elevated left atrial pressure.   2. Right ventricular systolic function is normal. The right ventricular  size is normal. Tricuspid regurgitation signal is inadequate for assessing  PA pressure.   3. Trivial pericardial effusion is circumferential.   4. The mitral valve is normal in structure. No evidence of mitral valve  regurgitation. No evidence of mitral stenosis.   5. The aortic valve has an indeterminant number of cusps. Aortic valve  regurgitation is not visualized. No aortic stenosis is present.   6. Aortic dilatation noted. There is mild dilatation of the ascending  aorta, measuring 38 mm.     Recent labs: 06/16/2023: Glucose 75, BUN/Cr 15/0.88. EGFR >60. Na/K 132/4.2.  Rest of the CMP normal H/H 14/43. MCV 89. Platelets 230 Chol 127, TG 73, HDL 44, LDL 69  12/23/2022: Glucose 76, BUN/Cr 20/0.96. EGFR >60. Na/K 134/4.2. Rest of the CMP normal H/H 13/40. MCV 88. Platelets 218  11/20/2022: Glucose 84, BUN/Cr 22/1.04. EGFR >60. Na/K 130/4.5. Rest of the CMP normal H/H 13/39. MCV 88. Platelets 260 TSH 0.1 normal  08/2022: Chol 185, TG 96, HDL 56, LDL  111  09/2021; HbA1C 6.2%    Review of Systems  Cardiovascular:  Positive for dyspnea on exertion. Negative for chest pain, leg swelling, palpitations and syncope.         Vitals:   07/04/23 0914  BP: (!) 148/87  Pulse: (!) 54  Resp: 17      Body mass index is 29.24 kg/m. Filed Weights   07/04/23 0914  Weight: 203 lb 12.8 oz (92.4 kg)      Objective:   Physical Exam Vitals and nursing note reviewed.  Constitutional:      General: He is not in acute distress. Neck:     Vascular: No JVD.  Cardiovascular:     Rate and Rhythm: Normal rate and regular rhythm.     Heart sounds: Normal heart sounds. No murmur heard. Pulmonary:     Effort: Pulmonary effort is normal.     Breath sounds: Wheezing present. No rales.  Musculoskeletal:  Right lower leg: No edema.     Left lower leg: No edema.         Assessment & Recommendations:    67 y/o Caucasian male with multiple myeloma, interstitial lung disease, drug-induced pneumonitis, coronary calcification  Coronary calcification: Moderate coronary calcification (CT chest 10/2022). No ischemic on stress testing (11/2022). Structurally normal heart (11/2022). No anginal symptoms. Exertional dyspnea is most likely due to ILD.  Echocardiogram shows only grade 1 diastolic dysfunction, but clinically does not appear to have any volume overload. Recommend aspirin 81 mg daily given coronary calcification, along with statin.  He did not tolerate rosuvastatin due to myalgias, and wants to continue ezetimibe-simvastatin.  This is reasonable.  Hypertension: Blood pressure elevated today, generally well-controlled as per the patient.  He is currently on bisoprolol started by Dr. Azucena Cecil about a year ago.  He certainly has several other options for antihypertensive therapy if needed.  Have encouraged him to keep a log of his blood pressure readings.  If SBP consistently remains >140 mmHg, could consider addition of a diuretic  hydrochlorothiazide 12.5 mg daily.  F/u in 1 year    Elder Negus, MD Pager: [phone] Office: [phone]

## 2023-07-06 NOTE — Therapy (Unsigned)
OUTPATIENT SPEECH LANGUAGE PATHOLOGY VOICE TREATMENT (PROGRESS NOTE)   Patient Name: Samuel [name] MRN: [id number] DOB:[birth year], 67 y.o., male Today's Date: 07/06/2023  PCP: Tally Joe, MD REFERRING PROVIDER: Tally Joe, MD  END OF SESSION:      Speech Therapy Progress Note  Dates of Reporting Period: 05-26-23 to current  Objective Reports of Subjective Statement: Reports positive perception and feedback from others re: voice with overall reduction in coughing and throat clearing noted outside of therapy.   Objective Measurements: Pt demonstrates reduced upper body tension, increased use of abdominal breathing, and improved awareness of positive/negative vocal behaviors/patterns resulting in increased ability to suppress cough and improve vocal quality during structured tasks.   Goal Update: see goals below  Plan: continue per POC  Reason Skilled Services are Required: Pt making good progress towards goals and continues to benefit from skilled ST intervention to maximize vocal quality over longer utterances and in discourse.     Past Medical History:  Diagnosis Date   Asthma    GERD (gastroesophageal reflux disease)    High cholesterol    under control.    History of blood transfusion    Hypothyroidism    Interstitial lung disease (HCC)    Multiple myeloma (HCC)    Perennial allergic rhinitis    Pneumonia    Sciatic pain, right    Seasonal allergic rhinitis    Thyroid disease    Past Surgical History:  Procedure Laterality Date   BRONCHIAL BIOPSY  06/18/2021   Procedure: BRONCHIAL BIOPSIES;  Surgeon: Leslye Peer, MD;  Location: [facility];  Service: Cardiopulmonary;;   BRONCHIAL BIOPSY  08/28/2021   Procedure: BRONCHIAL BIOPSIES;  Surgeon: Josephine Igo, DO;  Location: [facility];  Service: Cardiopulmonary;;   BRONCHIAL WASHINGS  06/18/2021   Procedure: BRONCHIAL WASHINGS;  Surgeon: Leslye Peer, MD;  Location: [facility];  Service:  Cardiopulmonary;;   BRONCHIAL WASHINGS  08/28/2021   Procedure: BRONCHIAL WASHINGS;  Surgeon: Josephine Igo, DO;  Location: [facility];  Service: Cardiopulmonary;;   NASAL SINUS SURGERY     NECK SURGERY  1996   VIDEO BRONCHOSCOPY N/A 06/18/2021   Procedure: VIDEO BRONCHOSCOPY WITH FLUORO;  Surgeon: Leslye Peer, MD;  Location: [facility];  Service: Cardiopulmonary;  Laterality: N/A;   VIDEO BRONCHOSCOPY N/A 08/28/2021   Procedure: VIDEO BRONCHOSCOPY WITH FLUORO;  Surgeon: Josephine Igo, DO;  Location: [facility];  Service: Cardiopulmonary;  Laterality: N/A;   Patient Active Problem List   Diagnosis Date Noted   Essential hypertension 07/04/2023   Coronary artery disease 11/28/2022   Mixed hyperlipidemia 11/28/2022   Recurrent sinusitis 11/04/2022   Acute bacterial rhinosinusitis 09/26/2022   Chronic respiratory failure with hypoxia (HCC) 08/01/2022   S/P bronchoscopy    ILD (interstitial lung disease) (HCC)    Preop cardiovascular exam 08/21/2021   Pneumonitis 07/20/2021   Anxiety 07/20/2021   Interstitial pulmonary disease (HCC) 07/20/2021   Abnormal CT of the chest 06/18/2021   Dyspnea 06/15/2021   Hypoxia 06/15/2021   Multiple myeloma (HCC) 03/05/2021   Multiple myeloma not having achieved remission (HCC) 03/05/2021   Bone lesion 02/14/2021   Monoclonal gammopathy 02/14/2021   High cholesterol    Allergic rhinitis 11/23/2020   Mild persistent asthma 11/23/2020   SOB (shortness of breath) 10/04/2011    Onset date: 05/15/23 (referral date)  REFERRING DIAG: R49.0 (ICD-10-CM) - Dysphonia  THERAPY DIAG: No diagnosis found.  Rationale for Evaluation and Treatment: Rehabilitation  SUBJECTIVE:   SUBJECTIVE STATEMENT: "I'm  just kind of breathy today" Accompanied by: self  PERTINENT HISTORY: "Samuel [name] is a 67 y.o. male who presents as a return patient, for follow-up of sinusitis and globus sensation. At time of last visit on 03/14/2023, nasal culture was  obtained following thick mucoid drainage noted in the right nasal cavity. This was abnormal, and demonstrated gram-positive bacilli. Patient was subsequently treated with a 2-week course of oral antibiotics, and presents today for posttreatment imaging. He has continued to use proton pump inhibitor twice daily, but continues to experience frequent throat clearing and globus sensation. He states he has been working on dietary and lifestyle modifications as previously instructed, and has been partially successful. He still has difficulty with frequent throat clearing and needing to cough often. He also has not been able to obtain humidification for his oxygen, as the DME company stated they needed a prescription.  Last visit 03/14/2023: Samuel [name] is a 67 y.o. male who presents as a new consult, referred by [name], FNP , for evaluation and treatment of sinus issues, hoarseness, and throat symptoms. He is accompanied by his wife.  Patient had history of multiple myeloma, status post treatment with chemotherapy. He developed interstitial lung disease following chemotherapeutic treatment, and is currently followed closely by pulmonology. He also endorses history of significant environmental allergies, and reports that recent allergy testing showed positivity to "everything.". He was previously on immunotherapy, but this was held during his cancer treatment. He is considering starting back on the chemotherapy due to the significance of his symptoms. He is currently using Singulair, antihistamine and Patanase nasal spray on a daily basis. He also reports using nasal saline irrigations on a daily basis. He reports history of deviated nasal septum repair in 1983. He denies history of sinusitis requiring treatment with oral antibiotics in the last 12 months. His primary symptoms are nasal congestion and postnasal drainage. He denies facial pressure, pain, hyposmia, hypogeusia. He has not had any  imaging of his head or sinuses. He does occasionally experience headaches. He uses oxygen on a nightly basis, and states that his oxygen is currently not humidified.  He reports symptoms of silent reflux, and states he is currently taking omeprazole on a daily basis. [name] is frequent throat clearing and globus sensation. He also reports occasional hoarseness."  PAIN:  Are you having pain? No  FALLS: Has patient fallen in last 6 months? No  LIVING ENVIRONMENT: Lives with: lives with their family Lives in: House/apartment  PLOF:Level of assistance: Independent with ADLs, Independent with IADLs Employment: Full-time employment  PATIENT GOALS: "control my breath, control my speech, and relaxation"   OBJECTIVE:   DIAGNOSTIC FINDINGS: "Nasolaryngoscopy performed at last visit demonstrated moderate interarytenoid edema with otherwise normal upper airway exam. Counseled patient to continue twice daily omeprazole for next 2-3 months. We discussed dietary lifestyle modifications for treatment of reflux to include avoiding spicy and acidic foods which exacerbate reflux, avoiding late meals and snacking, avoiding caffeine and stimulants, avoiding carbonated and alcoholic beverages, adequate oral hydration, avoidance of throat clearing and sleeping with the head of bed elevated. Recommended follow-up with gastroenterology if symptoms do not improve after 3 months of twice daily PPI. Offered referral to speech-language pathology due to continued hoarseness and frequent throat clearing, patient was agreeable, order placed today."   COGNITION: Overall cognitive status: Within functional limits for tasks assessed  SOCIAL HISTORY: Occupation: Full time - Social research officer, government intake: optimal but varies - typically 3x16 oz of water Caffeine/alcohol intake:  None Daily voice use: moderate at work, minimal at home   PERCEPTUAL VOICE ASSESSMENT: Voice quality: hoarse, breathy, rough, low vocal  intensity, and vocal fatigue Vocal abuse: habitual throat clearing, abnormal breathing pattern, and excessive voice use Resonance: normal Respiratory function: clavicular breathing and speaking on residual capacity  OBJECTIVE VOICE ASSESSMENT: Maximum phonation time for sustained "ah": 71 (4-5 seconds max)  Conversational loudness average: 68 dB with intentional  Conversational loudness range: 65-70 dB S/z ratio: 1 but limited seconds (4 seconds) (Suggestive of dysfunction >1.0)  PATIENT REPORTED OUTCOME MEASURES (PROM): Communication Participation Item Bank: 9 "quite a bit" for all situations  but "very much" for getting turn in fast moving conversation  TODAY'S TREATMENT:                                                                                                                                         07-02-23: Endorsed baseline breathiness today. Recently received steroid injection/prescription from allergist. Provided rec for increased hydration and use of humidification system d/t complaint of frequent dry throat. Targeted increased vocal intensity and clarity via sustained phonation exercises and pitch glides. Intermittent mod A required to optimize breath support and projection of voice to improve vocal quality. Provided as HEP. In subsequent conversation, pt presented with variations in vocal quality (hoarse versus clear), with pt able to project for improved clarity with usual min A.   06-25-23: Pt exhibiting increasingly independent carryover of cough suppression techniques, throat clear alternatives, and diaphragmatic breathing. Occasional non-verbal min A required to reduce upper body tension and use abdominal breath at the conversational level. No coughing evidenced throughout the session, infrequent throat clears followed by a sip of water. Vocal quality and intensity appeared to be impacted by general fatigue; however, able to demonstrate clear voice with SLP model.   06-24-23:  Endorsed some overall improvements in coughing and voice. Endorsed significant coughing attack s/p exercise. Deduced coughing attack prompted by reduced intentional breath support and type of exercise (plank). Provided instruction and demo of inhale/exhale on exertion to reduce VCD during exercise and pre-medicating with learned breathing technique prior to initiating conversation and physical movement. Pt only exhibited cough x1 today and no throat clearing evidenced. Pt able to suppress cough/throat clear x3 independently. Demonstrates improved vocal quality with focus on diaphragmatic breathing and awareness of upper body tension in structured conversation.   06/18/23: Demonstrating improved awareness and correction of upper body tension. Minimal throat clearing observed throughout the session. Implementing throat clear alternatives with mod I. Targeted reduction of body tension and increased breath support during structured reading task. Able to reduce upper body tension and use abdominal breathing in sentences and short paragraphs with occasional fading min A. Intermittent but mild coughing attacks noted with occasional cues required to suppress. Updated HEP to include structured reading practice.   06/17/23: See subjective statement - cues effective to reduce upper body tension and  use diaphragmatic breathing when heightened stress exhibite. Suggested setting intent with abdominal breaths x3 prior to answering phone. Rare coughing x2, which subsided with mod A to reduce tension and focus on breathing. Targeted conversation today with intermittent min A required to reduce shoulder tension, focus on abdominal breath with hand on abdomen, and project voice forward. Utilize fidget aid to aid relaxation with mild benefit.   06/11/23: Reported positive affirmations from colleagues re: improved voice quality and decreased coughing. Today, occasional min A required to ID and reduce upper body tension, use breathing  techniques to suppress cough, and increase vocal intensity in conversation. Provided recommendations to aid awareness and mitigation of cough based on initial symptoms and how to manage breath support during transitions periods.   06/09/23: Reported good carryover of trained techniques to suppress cough/throat clearing with benefit endorsed. Pt able to independently reduce/suppress cough/throat clear with ~60% accuracy this session. Occasional min A otherwise needed, mostly to relax upper body. Trialed SOVT and resonant exercises, which were limited d/t breath support and cyclical nature of VCD. Targeted "clear" voice with cue to speak out, with benefit achieved at phrase level. Plan to address next session.   06/06/23: Reviewed cyclical nature of VCD/chronic throat clearing or coughing. SLP answered pt's questions regarding role of reflux. Pt able to teach back strategies to avoid chronic throat clearing: relaxing shoulders, neck, chest, sniff and blow, hard swallow, water sips. Addressed use of strategies during conversational speech, as pt reporting can be challenging to engage in work meeting/appointments when occurring. Pt demonstrating x1 coughing episode, lasting ~20 seconds, responsive to SLP cues to use water and hard swallow. X6 throat clears, which was minimal compared to instances in which pt demonstrated mod-I employment of alternative. Generated strategy to further address stress in regards to pt perceived trigger for increasing upper body tension.   06-04-23: Pt entered with cough which persisted throughout session. Based on reported s/sx and triggers, SLP suspects chronic cough/VCD. Provided education and handout re: VCD and throat clear alternatives. Trained patient in diaphragmatic breathing and sniff-sniff-blow technique to suppress cough, in which pt able to demo with usual fading to occasional min A. Guided patient in self-analysis of upper body tension given usual tension present in  chest/neck. Tension resulted in breath hold during inhale/exhale cycle. Breathing pattern improved with use of trained techniques. Trialed SOVTE, which was impacted by impaired breath support. Will incorporate as able.   05-26-23: Briefly discussed 2 sub-systems of voicing (respiratory and phonatory). Dicussed plan to further target diaphragmatic breathing, forward projection of voice, and reduction in throat clears to optimize vocal quality. Pt verbalized understanding and denied further questions at this time.    PATIENT EDUCATION: Education details: see above  Person educated: Patient and Spouse Education method: Medical illustrator Education comprehension: verbalized understanding, returned demonstration, and needs further education  HOME EXERCISE PROGRAM: Abdominal breathing, forward resonance  GOALS: Goals reviewed with patient? Yes  SHORT TERM GOALS: Target date: 06/23/2023  Pt will reduce throat clearing and coughing by 50% at STG date using trained techniques Baseline: > 9, 6 respectively; 1/0 at STG date Goal status: MET  2.  Pt will employ abdominal breathing during structured tasks with 80% accuracy given occasional min A  Baseline:  Goal status: MET  3.  Pt will employ abdominal breathing during structured conversation with 80% accuracy given occasional min A Baseline:  Goal status: MET  4.  Pt will maintain clear vocal quality in 5-10 minute structured conversations x2 given occasional  min A  Baseline:  Goal status: MET    LONG TERM GOALS: Target date: 07/21/2023  Pt will reduce throat clearing and coughing by 75% at LTG date using trained techniques Baseline:  Goal status: IN PROGRESS  2.  Pt will employ abdominal breathing during unstructured conversation with 80% accuracy given rare min A Baseline:  Goal status: IN PROGRESS  3.  Pt will maintain clear vocal quality in 15+ minute unstructured conversations x2 given rare min A  Baseline:  Goal  status: IN PROGRESS  4.  Pt will report improved communication effectiveness via PROM by 2 pts at last ST session  Baseline: CPIB=9 Goal status: IN PROGRESS  ASSESSMENT:  CLINICAL IMPRESSION: Patient is a 67 y.o. M who was seen today for dysphonia. PMHX significant for Multiple Myeloma, interstitial lung disease, and GERD. Conducted ongoing education and instruction of cough suppression techniques, relaxed diaphragmatic breathing, throat clear alternatives, and clear voicing, with improving carryover exhibited. Pt continuing to benefit from skilled ST intervention, such as training vocal hygiene protocol and voice techniques to optimize vocal quality and communication effectiveness.   OBJECTIVE IMPAIRMENTS: include voice disorder. These impairments are limiting patient from effectively communicating at home and in community. Factors affecting potential to achieve goals and functional outcome are co-morbidities. Patient will benefit from skilled SLP services to address above impairments and improve overall function.  REHAB POTENTIAL: Good  PLAN:  SLP FREQUENCY: 2x/week  SLP DURATION: 8 weeks  PLANNED INTERVENTIONS: Language facilitation, Environmental controls, Cueing hierachy, Internal/external aids, Functional tasks, Multimodal communication approach, SLP instruction and feedback, Compensatory strategies, Patient/family education, and Re-evaluation    Gracy Racer, CCC-SLP 07/06/2023, 12:47 PM

## 2023-07-07 ENCOUNTER — Ambulatory Visit: Payer: Medicare Other

## 2023-07-07 DIAGNOSIS — R498 Other voice and resonance disorders: Secondary | ICD-10-CM | POA: Diagnosis not present

## 2023-07-08 NOTE — Therapy (Unsigned)
OUTPATIENT SPEECH LANGUAGE PATHOLOGY VOICE TREATMENT    Patient Name: Samuel [name] MRN: [id number] DOB:31-Jan-1956, 67 y.o., male Today's Date: 07/09/2023  PCP: Tally Joe, MD REFERRING PROVIDER: Tally Joe, MD  END OF SESSION:  End of Session - 07/09/23 0932     Visit Number 12    Number of Visits 17    Date for SLP Re-Evaluation 07/21/23    Authorization Type medicare/ BCBS    SLP Start Time 0932    SLP Stop Time  1015    SLP Time Calculation (min) 43 min    Activity Tolerance Patient tolerated treatment well               Past Medical History:  Diagnosis Date   Asthma    GERD (gastroesophageal reflux disease)    High cholesterol    under control.    History of blood transfusion    Hypothyroidism    Interstitial lung disease (HCC)    Multiple myeloma (HCC)    Perennial allergic rhinitis    Pneumonia    Sciatic pain, right    Seasonal allergic rhinitis    Thyroid disease    Past Surgical History:  Procedure Laterality Date   BRONCHIAL BIOPSY  06/18/2021   Procedure: BRONCHIAL BIOPSIES;  Surgeon: Leslye Peer, MD;  Location: [facility];  Service: Cardiopulmonary;;   BRONCHIAL BIOPSY  08/28/2021   Procedure: BRONCHIAL BIOPSIES;  Surgeon: Josephine Igo, DO;  Location: [facility];  Service: Cardiopulmonary;;   BRONCHIAL WASHINGS  06/18/2021   Procedure: BRONCHIAL WASHINGS;  Surgeon: Leslye Peer, MD;  Location: [facility];  Service: Cardiopulmonary;;   BRONCHIAL WASHINGS  08/28/2021   Procedure: BRONCHIAL WASHINGS;  Surgeon: Josephine Igo, DO;  Location: [facility];  Service: Cardiopulmonary;;   NASAL SINUS SURGERY     NECK SURGERY  1996   VIDEO BRONCHOSCOPY N/A 06/18/2021   Procedure: VIDEO BRONCHOSCOPY WITH FLUORO;  Surgeon: Leslye Peer, MD;  Location: [facility];  Service: Cardiopulmonary;  Laterality: N/A;   VIDEO BRONCHOSCOPY N/A 08/28/2021   Procedure: VIDEO BRONCHOSCOPY WITH FLUORO;  Surgeon: Josephine Igo, DO;  Location:  [facility];  Service: Cardiopulmonary;  Laterality: N/A;   Patient Active Problem List   Diagnosis Date Noted   Essential hypertension 07/04/2023   Coronary artery disease 11/28/2022   Mixed hyperlipidemia 11/28/2022   Recurrent sinusitis 11/04/2022   Acute bacterial rhinosinusitis 09/26/2022   Chronic respiratory failure with hypoxia (HCC) 08/01/2022   S/P bronchoscopy    ILD (interstitial lung disease) (HCC)    Preop cardiovascular exam 08/21/2021   Pneumonitis 07/20/2021   Anxiety 07/20/2021   Interstitial pulmonary disease (HCC) 07/20/2021   Abnormal CT of the chest 06/18/2021   Dyspnea 06/15/2021   Hypoxia 06/15/2021   Multiple myeloma (HCC) 03/05/2021   Multiple myeloma not having achieved remission (HCC) 03/05/2021   Bone lesion 02/14/2021   Monoclonal gammopathy 02/14/2021   High cholesterol    Allergic rhinitis 11/23/2020   Mild persistent asthma 11/23/2020   SOB (shortness of breath) 10/04/2011    Onset date: 05/15/23 (referral date)  REFERRING DIAG: R49.0 (ICD-10-CM) - Dysphonia  THERAPY DIAG: Other voice and resonance disorders  Rationale for Evaluation and Treatment: Rehabilitation  SUBJECTIVE:   SUBJECTIVE STATEMENT: "Little husky this morning" Accompanied by: self  PERTINENT HISTORY: "Samuel [name] is a 67 y.o. male who presents as a return patient, for follow-up of sinusitis and globus sensation. At time of last visit on 03/14/2023, nasal culture was obtained following thick  mucoid drainage noted in the right nasal cavity. This was abnormal, and demonstrated gram-positive bacilli. Patient was subsequently treated with a 2-week course of oral antibiotics, and presents today for posttreatment imaging. He has continued to use proton pump inhibitor twice daily, but continues to experience frequent throat clearing and globus sensation. He states he has been working on dietary and lifestyle modifications as previously instructed, and has been partially successful.  He still has difficulty with frequent throat clearing and needing to cough often. He also has not been able to obtain humidification for his oxygen, as the DME company stated they needed a prescription.  Last visit 03/14/2023: Samuel [name] is a 67 y.o. male who presents as a new consult, referred by Samuel Ingles, FNP , for evaluation and treatment of sinus issues, hoarseness, and throat symptoms. He is accompanied by his wife.  Patient had history of multiple myeloma, status post treatment with chemotherapy. He developed interstitial lung disease following chemotherapeutic treatment, and is currently followed closely by pulmonology. He also endorses history of significant environmental allergies, and reports that recent allergy testing showed positivity to "everything.". He was previously on immunotherapy, but this was held during his cancer treatment. He is considering starting back on the chemotherapy due to the significance of his symptoms. He is currently using Singulair, antihistamine and Patanase nasal spray on a daily basis. He also reports using nasal saline irrigations on a daily basis. He reports history of deviated nasal septum repair in 1983. He denies history of sinusitis requiring treatment with oral antibiotics in the last 12 months. His primary symptoms are nasal congestion and postnasal drainage. He denies facial pressure, pain, hyposmia, hypogeusia. He has not had any imaging of his head or sinuses. He does occasionally experience headaches. He uses oxygen on a nightly basis, and states that his oxygen is currently not humidified.  He reports symptoms of silent reflux, and states he is currently taking omeprazole on a daily basis. [name] is frequent throat clearing and globus sensation. He also reports occasional hoarseness."  PAIN:  Are you having pain? No  FALLS: Has patient fallen in last 6 months? No  LIVING ENVIRONMENT: Lives with: lives with their family Lives in:  House/apartment  PLOF:Level of assistance: Independent with ADLs, Independent with IADLs Employment: Full-time employment  PATIENT GOALS: "control my breath, control my speech, and relaxation"   OBJECTIVE:   DIAGNOSTIC FINDINGS: "Nasolaryngoscopy performed at last visit demonstrated moderate interarytenoid edema with otherwise normal upper airway exam. Counseled patient to continue twice daily omeprazole for next 2-3 months. We discussed dietary lifestyle modifications for treatment of reflux to include avoiding spicy and acidic foods which exacerbate reflux, avoiding late meals and snacking, avoiding caffeine and stimulants, avoiding carbonated and alcoholic beverages, adequate oral hydration, avoidance of throat clearing and sleeping with the head of bed elevated. Recommended follow-up with gastroenterology if symptoms do not improve after 3 months of twice daily PPI. Offered referral to speech-language pathology due to continued hoarseness and frequent throat clearing, patient was agreeable, order placed today."   OBJECTIVE VOICE ASSESSMENT: Maximum phonation time for sustained "ah": 71 (4-5 seconds max)  Conversational loudness average: 68 dB with intentional  Conversational loudness range: 65-70 dB S/z ratio: 1 but limited seconds (4 seconds) (Suggestive of dysfunction >1.0)  PATIENT REPORTED OUTCOME MEASURES (PROM): Communication Participation Item Bank: 9 "quite a bit" for all situations  but "very much" for getting turn in fast moving conversation  TODAY'S TREATMENT:  07-09-23: Endorsed consuming meds/foods that increased reflux symptoms. Targeted SOVT exercises without water to reduce tension and improve vocal quality. Pt required occasional min A to optimize lip seal around straw. Achieved success with sustained hum, glides, accents, and song with  occasional mod cues to reset breath support and send voice through the straw. SLP educated pt on subsystems of voicing to increase pt understanding. Updated HEP to include SOVT with handout provided. Pt reporting subjective improvements in voice/cough and appreciation for SLP team assistance. Affirmed positive progress exhibited thus far.   07-07-23: Pt endorsed improved vocal quality during HEP tasks (see subjective statement). Targeted sustained phonation to increase vocal quality and intensity with optimal breath support. Pt maintained high 70s dB on sustained /a/ for 4-5 seconds with occasional min A to decrease upper body tension. Noted increase in tension as pt attempted to increase volume. SLP introduced and modeled flow phonation to reduce muscle tension. Pt demonstrated flow phonation structured tasks with occasional mod A to reset his breath and send his voice forward. Noted pt needed three to five abdominal breaths between repetitions to regain adequate breathe support. Rare throat clearing and coughing observed throughout the session with occasional min A to use cough suppressant strategies.   07-02-23: Endorsed baseline breathiness today. Recently received steroid injection/prescription from allergist. Provided rec for increased hydration and use of humidification system d/t complaint of frequent dry throat. Targeted increased vocal intensity and clarity via sustained phonation exercises and pitch glides. Intermittent mod A required to optimize breath support and projection of voice to improve vocal quality. Provided as HEP. In subsequent conversation, pt presented with variations in vocal quality (hoarse versus clear), with pt able to project for improved clarity with usual min A.   06-25-23: Pt exhibiting increasingly independent carryover of cough suppression techniques, throat clear alternatives, and diaphragmatic breathing. Occasional non-verbal min A required to reduce upper body tension and use  abdominal breath at the conversational level. No coughing evidenced throughout the session, infrequent throat clears followed by a sip of water. Vocal quality and intensity appeared to be impacted by general fatigue; however, able to demonstrate clear voice with SLP model.   06-24-23: Endorsed some overall improvements in coughing and voice. Endorsed significant coughing attack s/p exercise. Deduced coughing attack prompted by reduced intentional breath support and type of exercise (plank). Provided instruction and demo of inhale/exhale on exertion to reduce VCD during exercise and pre-medicating with learned breathing technique prior to initiating conversation and physical movement. Pt only exhibited cough x1 today and no throat clearing evidenced. Pt able to suppress cough/throat clear x3 independently. Demonstrates improved vocal quality with focus on diaphragmatic breathing and awareness of upper body tension in structured conversation.   06/18/23: Demonstrating improved awareness and correction of upper body tension. Minimal throat clearing observed throughout the session. Implementing throat clear alternatives with mod I. Targeted reduction of body tension and increased breath support during structured reading task. Able to reduce upper body tension and use abdominal breathing in sentences and short paragraphs with occasional fading min A. Intermittent but mild coughing attacks noted with occasional cues required to suppress. Updated HEP to include structured reading practice.   06/17/23: See subjective statement - cues effective to reduce upper body tension and use diaphragmatic breathing when heightened stress exhibite. Suggested setting intent with abdominal breaths x3 prior to answering phone. Rare coughing x2, which subsided with mod A to reduce tension and focus on breathing. Targeted conversation today with intermittent min A required to reduce shoulder  tension, focus on abdominal breath with hand on  abdomen, and project voice forward. Utilize fidget aid to aid relaxation with mild benefit.   06/11/23: Reported positive affirmations from colleagues re: improved voice quality and decreased coughing. Today, occasional min A required to ID and reduce upper body tension, use breathing techniques to suppress cough, and increase vocal intensity in conversation. Provided recommendations to aid awareness and mitigation of cough based on initial symptoms and how to manage breath support during transitions periods.   06/09/23: Reported good carryover of trained techniques to suppress cough/throat clearing with benefit endorsed. Pt able to independently reduce/suppress cough/throat clear with ~60% accuracy this session. Occasional min A otherwise needed, mostly to relax upper body. Trialed SOVT and resonant exercises, which were limited d/t breath support and cyclical nature of VCD. Targeted "clear" voice with cue to speak out, with benefit achieved at phrase level. Plan to address next session.   06/06/23: Reviewed cyclical nature of VCD/chronic throat clearing or coughing. SLP answered pt's questions regarding role of reflux. Pt able to teach back strategies to avoid chronic throat clearing: relaxing shoulders, neck, chest, sniff and blow, hard swallow, water sips. Addressed use of strategies during conversational speech, as pt reporting can be challenging to engage in work meeting/appointments when occurring. Pt demonstrating x1 coughing episode, lasting ~20 seconds, responsive to SLP cues to use water and hard swallow. X6 throat clears, which was minimal compared to instances in which pt demonstrated mod-I employment of alternative. Generated strategy to further address stress in regards to pt perceived trigger for increasing upper body tension.   06-04-23: Pt entered with cough which persisted throughout session. Based on reported s/sx and triggers, SLP suspects chronic cough/VCD. Provided education and handout  re: VCD and throat clear alternatives. Trained patient in diaphragmatic breathing and sniff-sniff-blow technique to suppress cough, in which pt able to demo with usual fading to occasional min A. Guided patient in self-analysis of upper body tension given usual tension present in chest/neck. Tension resulted in breath hold during inhale/exhale cycle. Breathing pattern improved with use of trained techniques. Trialed SOVTE, which was impacted by impaired breath support. Will incorporate as able.   05-26-23: Briefly discussed 2 sub-systems of voicing (respiratory and phonatory). Dicussed plan to further target diaphragmatic breathing, forward projection of voice, and reduction in throat clears to optimize vocal quality. Pt verbalized understanding and denied further questions at this time.    PATIENT EDUCATION: Education details: see above  Person educated: Patient and Spouse Education method: Medical illustrator Education comprehension: verbalized understanding, returned demonstration, and needs further education  HOME EXERCISE PROGRAM: Abdominal breathing, forward resonance  GOALS: Goals reviewed with patient? Yes  SHORT TERM GOALS: Target date: 06/23/2023  Pt will reduce throat clearing and coughing by 50% at STG date using trained techniques Baseline: > 9, 6 respectively; 1/0 at STG date Goal status: MET  2.  Pt will employ abdominal breathing during structured tasks with 80% accuracy given occasional min A  Baseline:  Goal status: MET  3.  Pt will employ abdominal breathing during structured conversation with 80% accuracy given occasional min A Baseline:  Goal status: MET  4.  Pt will maintain clear vocal quality in 5-10 minute structured conversations x2 given occasional min A  Baseline:  Goal status: MET    LONG TERM GOALS: Target date: 07/21/2023  Pt will reduce throat clearing and coughing by 75% at LTG date using trained techniques Baseline:  Goal status: IN  PROGRESS  2.  Pt  will employ abdominal breathing during unstructured conversation with 80% accuracy given rare min A Baseline:  Goal status: IN PROGRESS  3.  Pt will maintain clear vocal quality in 15+ minute unstructured conversations x2 given rare min A  Baseline:  Goal status: IN PROGRESS  4.  Pt will report improved communication effectiveness via PROM by 2 pts at last ST session  Baseline: CPIB=9 Goal status: IN PROGRESS  ASSESSMENT:  CLINICAL IMPRESSION: Patient is a 67 y.o. M who was seen today for dysphonia. PMHX significant for Multiple Myeloma, interstitial lung disease, and GERD. Conducted ongoing education and instruction of cough suppression techniques, relaxed diaphragmatic breathing, throat clear alternatives, and voice exercises, with improving carryover exhibited. Updated HEP for voice exercises. Pt continuing to benefit from skilled ST intervention, such as training vocal hygiene protocol and voice techniques to optimize vocal quality and communication effectiveness.   OBJECTIVE IMPAIRMENTS: include voice disorder. These impairments are limiting patient from effectively communicating at home and in community. Factors affecting potential to achieve goals and functional outcome are co-morbidities. Patient will benefit from skilled SLP services to address above impairments and improve overall function.  REHAB POTENTIAL: Good  PLAN:  SLP FREQUENCY: 2x/week  SLP DURATION: 8 weeks  PLANNED INTERVENTIONS: Language facilitation, Environmental controls, Cueing hierachy, Internal/external aids, Functional tasks, Multimodal communication approach, SLP instruction and feedback, Compensatory strategies, Patient/family education, and Re-evaluation    Gracy Racer, CCC-SLP 07/09/2023, 10:58 AM

## 2023-07-09 ENCOUNTER — Ambulatory Visit: Payer: Medicare Other

## 2023-07-09 DIAGNOSIS — R498 Other voice and resonance disorders: Secondary | ICD-10-CM

## 2023-07-09 NOTE — Patient Instructions (Signed)
Semi occluded vocal tract exercises (SOVTE)  Exercises designed to reduce tension, encourage airflow, and coordinate respiration and phonation. Through narrowing the vocal tract at the lips, we create back pressure which optimizes how the VF vibrate and encourages correct placement of larynx when voicing. The Science Behind Straw Phonation  Use SOVTE as a warm up before prolonged speaking and vocal exercises  These are meant to be relaxed and gentle, NOT forced.  A goal would be 2-3 minutes several times a day and prior to vocal exercises  As always, use good belly breathing while completing SOVTE  1 Breath: First, we're going to practice with no sound. Take a good deep belly breath and exhale through the straw, aiming for big bubbles without breaks.  2 Vowels: Now I want you to try and keep good airflow while turning on your voice. I want you to take a good belly breath. When you exhale, say "oooh" at a comfortable pitch. You should feel the vibration at your lips.  3 Pitch Glides: I want you to start a a low pitch on "oooh" and then glide up to a high pitch and hold. Always use a good belly breath. Now try going the other way, start at a high pitch and glide down to a low pitch and hold.  4 Sirens: Glide up and down as many times as you can on a good, smooth exhale.  5 Song: Pick a familiar song to hum into the straw. Goal is to maintain good breath support, consistent bubbles indicating good airflow. Try happy birthday or national anthem   Vocal Straw Exercises with Samuel [name] on YouTube:  [email or web address]

## 2023-07-10 ENCOUNTER — Ambulatory Visit: Payer: Medicare Other | Admitting: Cardiology

## 2023-07-15 ENCOUNTER — Ambulatory Visit: Payer: Medicare Other

## 2023-07-15 DIAGNOSIS — R498 Other voice and resonance disorders: Secondary | ICD-10-CM | POA: Diagnosis not present

## 2023-07-15 NOTE — Therapy (Signed)
OUTPATIENT SPEECH LANGUAGE PATHOLOGY VOICE TREATMENT    Patient Name: Samuel [name] MRN: [id number] DOB:[birth year], 67 y.o., male Today's Date: 07/15/2023  PCP: Tally Joe, MD REFERRING PROVIDER: Tally Joe, MD  END OF SESSION:  End of Session - 07/15/23 0932     Visit Number 13    Number of Visits 17    Date for SLP Re-Evaluation 07/21/23    Authorization Type medicare/ BCBS    SLP Start Time [id number]    SLP Stop Time  1015    SLP Time Calculation (min) 42 min    Activity Tolerance Patient tolerated treatment well                Past Medical History:  Diagnosis Date   Asthma    GERD (gastroesophageal reflux disease)    High cholesterol    under control.    History of blood transfusion    Hypothyroidism    Interstitial lung disease (HCC)    Multiple myeloma (HCC)    Perennial allergic rhinitis    Pneumonia    Sciatic pain, right    Seasonal allergic rhinitis    Thyroid disease    Past Surgical History:  Procedure Laterality Date   BRONCHIAL BIOPSY  06/18/2021   Procedure: BRONCHIAL BIOPSIES;  Surgeon: Leslye Peer, MD;  Location: [facility];  Service: Cardiopulmonary;;   BRONCHIAL BIOPSY  08/28/2021   Procedure: BRONCHIAL BIOPSIES;  Surgeon: Josephine Igo, DO;  Location: [facility];  Service: Cardiopulmonary;;   BRONCHIAL WASHINGS  06/18/2021   Procedure: BRONCHIAL WASHINGS;  Surgeon: Leslye Peer, MD;  Location: [facility];  Service: Cardiopulmonary;;   BRONCHIAL WASHINGS  08/28/2021   Procedure: BRONCHIAL WASHINGS;  Surgeon: Josephine Igo, DO;  Location: [facility];  Service: Cardiopulmonary;;   NASAL SINUS SURGERY     NECK SURGERY  1996   VIDEO BRONCHOSCOPY N/A 06/18/2021   Procedure: VIDEO BRONCHOSCOPY WITH FLUORO;  Surgeon: Leslye Peer, MD;  Location: [facility];  Service: Cardiopulmonary;  Laterality: N/A;   VIDEO BRONCHOSCOPY N/A 08/28/2021   Procedure: VIDEO BRONCHOSCOPY WITH FLUORO;  Surgeon: Josephine Igo, DO;   Location: [facility];  Service: Cardiopulmonary;  Laterality: N/A;   Patient Active Problem List   Diagnosis Date Noted   Essential hypertension 07/04/2023   Coronary artery disease 11/28/2022   Mixed hyperlipidemia 11/28/2022   Recurrent sinusitis 11/04/2022   Acute bacterial rhinosinusitis 09/26/2022   Chronic respiratory failure with hypoxia (HCC) 08/01/2022   S/P bronchoscopy    ILD (interstitial lung disease) (HCC)    Preop cardiovascular exam 08/21/2021   Pneumonitis 07/20/2021   Anxiety 07/20/2021   Interstitial pulmonary disease (HCC) 07/20/2021   Abnormal CT of the chest 06/18/2021   Dyspnea 06/15/2021   Hypoxia 06/15/2021   Multiple myeloma (HCC) 03/05/2021   Multiple myeloma not having achieved remission (HCC) 03/05/2021   Bone lesion 02/14/2021   Monoclonal gammopathy 02/14/2021   High cholesterol    Allergic rhinitis 11/23/2020   Mild persistent asthma 11/23/2020   SOB (shortness of breath) 10/04/2011    Onset date: 05/15/23 (referral date)  REFERRING DIAG: R49.0 (ICD-10-CM) - Dysphonia  THERAPY DIAG: Other voice and resonance disorders  Rationale for Evaluation and Treatment: Rehabilitation  SUBJECTIVE:   SUBJECTIVE STATEMENT: "I feel worn out" Accompanied by: self  PERTINENT HISTORY: "Samuel [name] is a 67 y.o. male who presents as a return patient, for follow-up of sinusitis and globus sensation. At time of last visit on 03/14/2023, nasal culture was obtained following  thick mucoid drainage noted in the right nasal cavity. This was abnormal, and demonstrated gram-positive bacilli. Patient was subsequently treated with a 2-week course of oral antibiotics, and presents today for posttreatment imaging. He has continued to use proton pump inhibitor twice daily, but continues to experience frequent throat clearing and globus sensation. He states he has been working on dietary and lifestyle modifications as previously instructed, and has been partially successful.  He still has difficulty with frequent throat clearing and needing to cough often. He also has not been able to obtain humidification for his oxygen, as the DME company stated they needed a prescription.  Last visit 03/14/2023: Samuel [name] is a 67 y.o. male who presents as a new consult, referred by Samuel Ingles, FNP , for evaluation and treatment of sinus issues, hoarseness, and throat symptoms. He is accompanied by his wife.  Patient had history of multiple myeloma, status post treatment with chemotherapy. He developed interstitial lung disease following chemotherapeutic treatment, and is currently followed closely by pulmonology. He also endorses history of significant environmental allergies, and reports that recent allergy testing showed positivity to "everything.". He was previously on immunotherapy, but this was held during his cancer treatment. He is considering starting back on the chemotherapy due to the significance of his symptoms. He is currently using Singulair, antihistamine and Patanase nasal spray on a daily basis. He also reports using nasal saline irrigations on a daily basis. He reports history of deviated nasal septum repair in 1983. He denies history of sinusitis requiring treatment with oral antibiotics in the last 12 months. His primary symptoms are nasal congestion and postnasal drainage. He denies facial pressure, pain, hyposmia, hypogeusia. He has not had any imaging of his head or sinuses. He does occasionally experience headaches. He uses oxygen on a nightly basis, and states that his oxygen is currently not humidified.  He reports symptoms of silent reflux, and states he is currently taking omeprazole on a daily basis. [name] is frequent throat clearing and globus sensation. He also reports occasional hoarseness."  PAIN:  Are you having pain? No  FALLS: Has patient fallen in last 6 months? No  LIVING ENVIRONMENT: Lives with: lives with their family Lives in:  House/apartment  PLOF:Level of assistance: Independent with ADLs, Independent with IADLs Employment: Full-time employment  PATIENT GOALS: "control my breath, control my speech, and relaxation"   OBJECTIVE:   DIAGNOSTIC FINDINGS: "Nasolaryngoscopy performed at last visit demonstrated moderate interarytenoid edema with otherwise normal upper airway exam. Counseled patient to continue twice daily omeprazole for next 2-3 months. We discussed dietary lifestyle modifications for treatment of reflux to include avoiding spicy and acidic foods which exacerbate reflux, avoiding late meals and snacking, avoiding caffeine and stimulants, avoiding carbonated and alcoholic beverages, adequate oral hydration, avoidance of throat clearing and sleeping with the head of bed elevated. Recommended follow-up with gastroenterology if symptoms do not improve after 3 months of twice daily PPI. Offered referral to speech-language pathology due to continued hoarseness and frequent throat clearing, patient was agreeable, order placed today."   OBJECTIVE VOICE ASSESSMENT: Maximum phonation time for sustained "ah": 71 (4-5 seconds max)  Conversational loudness average: 68 dB with intentional  Conversational loudness range: 65-70 dB S/z ratio: 1 but limited seconds (4 seconds) (Suggestive of dysfunction >1.0)  PATIENT REPORTED OUTCOME MEASURES (PROM): Communication Participation Item Bank: 9 "quite a bit" for all situations  but "very much" for getting turn in fast moving conversation  TODAY'S TREATMENT:  07-15-23: Endorsed feeling weak over last week, which reflected in low intensity, breathy vocal quality. Instructed diaphragmatic breathing to optimize breath support, which pt able to demo with mod I. Requested repeat education of SOVT exercises d/t lack of completion. Occasional mod A  provided to optimize labial seal, breath support, and intentional intensity. Improved vocal quality following warm up exercises. Pt able to demonstrate clear vocal quality with projection for short structured conversation. Demonstrated awareness of reducing intensity and clarity related to fatigue. Recommended prioritizing clear vocal quality for short durations. Encouraged patient to continue HEP.   07-09-23: Endorsed consuming meds/foods that increased reflux symptoms. Targeted SOVT exercises without water to reduce tension and improve vocal quality. Pt required occasional min A to optimize lip seal around straw. Achieved success with sustained hum, glides, accents, and song with occasional mod cues to reset breath support and send voice through the straw. SLP educated pt on subsystems of voicing to increase pt understanding. Updated HEP to include SOVT with handout provided. Pt reporting subjective improvements in voice/cough and appreciation for SLP team assistance. Affirmed positive progress exhibited thus far.   07-07-23: Pt endorsed improved vocal quality during HEP tasks (see subjective statement). Targeted sustained phonation to increase vocal quality and intensity with optimal breath support. Pt maintained high 70s dB on sustained /a/ for 4-5 seconds with occasional min A to decrease upper body tension. Noted increase in tension as pt attempted to increase volume. SLP introduced and modeled flow phonation to reduce muscle tension. Pt demonstrated flow phonation structured tasks with occasional mod A to reset his breath and send his voice forward. Noted pt needed three to five abdominal breaths between repetitions to regain adequate breathe support. Rare throat clearing and coughing observed throughout the session with occasional min A to use cough suppressant strategies.   07-02-23: Endorsed baseline breathiness today. Recently received steroid injection/prescription from allergist. Provided rec for  increased hydration and use of humidification system d/t complaint of frequent dry throat. Targeted increased vocal intensity and clarity via sustained phonation exercises and pitch glides. Intermittent mod A required to optimize breath support and projection of voice to improve vocal quality. Provided as HEP. In subsequent conversation, pt presented with variations in vocal quality (hoarse versus clear), with pt able to project for improved clarity with usual min A.   06-25-23: Pt exhibiting increasingly independent carryover of cough suppression techniques, throat clear alternatives, and diaphragmatic breathing. Occasional non-verbal min A required to reduce upper body tension and use abdominal breath at the conversational level. No coughing evidenced throughout the session, infrequent throat clears followed by a sip of water. Vocal quality and intensity appeared to be impacted by general fatigue; however, able to demonstrate clear voice with SLP model.   06-24-23: Endorsed some overall improvements in coughing and voice. Endorsed significant coughing attack s/p exercise. Deduced coughing attack prompted by reduced intentional breath support and type of exercise (plank). Provided instruction and demo of inhale/exhale on exertion to reduce VCD during exercise and pre-medicating with learned breathing technique prior to initiating conversation and physical movement. Pt only exhibited cough x1 today and no throat clearing evidenced. Pt able to suppress cough/throat clear x3 independently. Demonstrates improved vocal quality with focus on diaphragmatic breathing and awareness of upper body tension in structured conversation.   06/18/23: Demonstrating improved awareness and correction of upper body tension. Minimal throat clearing observed throughout the session. Implementing throat clear alternatives with mod I. Targeted reduction of body tension and increased breath support during structured reading task.  Able to  reduce upper body tension and use abdominal breathing in sentences and short paragraphs with occasional fading min A. Intermittent but mild coughing attacks noted with occasional cues required to suppress. Updated HEP to include structured reading practice.   06/17/23: See subjective statement - cues effective to reduce upper body tension and use diaphragmatic breathing when heightened stress exhibite. Suggested setting intent with abdominal breaths x3 prior to answering phone. Rare coughing x2, which subsided with mod A to reduce tension and focus on breathing. Targeted conversation today with intermittent min A required to reduce shoulder tension, focus on abdominal breath with hand on abdomen, and project voice forward. Utilize fidget aid to aid relaxation with mild benefit.   06/11/23: Reported positive affirmations from colleagues re: improved voice quality and decreased coughing. Today, occasional min A required to ID and reduce upper body tension, use breathing techniques to suppress cough, and increase vocal intensity in conversation. Provided recommendations to aid awareness and mitigation of cough based on initial symptoms and how to manage breath support during transitions periods.   06/09/23: Reported good carryover of trained techniques to suppress cough/throat clearing with benefit endorsed. Pt able to independently reduce/suppress cough/throat clear with ~60% accuracy this session. Occasional min A otherwise needed, mostly to relax upper body. Trialed SOVT and resonant exercises, which were limited d/t breath support and cyclical nature of VCD. Targeted "clear" voice with cue to speak out, with benefit achieved at phrase level. Plan to address next session.   06/06/23: Reviewed cyclical nature of VCD/chronic throat clearing or coughing. SLP answered pt's questions regarding role of reflux. Pt able to teach back strategies to avoid chronic throat clearing: relaxing shoulders, neck, chest, sniff and  blow, hard swallow, water sips. Addressed use of strategies during conversational speech, as pt reporting can be challenging to engage in work meeting/appointments when occurring. Pt demonstrating x1 coughing episode, lasting ~20 seconds, responsive to SLP cues to use water and hard swallow. X6 throat clears, which was minimal compared to instances in which pt demonstrated mod-I employment of alternative. Generated strategy to further address stress in regards to pt perceived trigger for increasing upper body tension.   06-04-23: Pt entered with cough which persisted throughout session. Based on reported s/sx and triggers, SLP suspects chronic cough/VCD. Provided education and handout re: VCD and throat clear alternatives. Trained patient in diaphragmatic breathing and sniff-sniff-blow technique to suppress cough, in which pt able to demo with usual fading to occasional min A. Guided patient in self-analysis of upper body tension given usual tension present in chest/neck. Tension resulted in breath hold during inhale/exhale cycle. Breathing pattern improved with use of trained techniques. Trialed SOVTE, which was impacted by impaired breath support. Will incorporate as able.   05-26-23: Briefly discussed 2 sub-systems of voicing (respiratory and phonatory). Dicussed plan to further target diaphragmatic breathing, forward projection of voice, and reduction in throat clears to optimize vocal quality. Pt verbalized understanding and denied further questions at this time.    PATIENT EDUCATION: Education details: see above  Person educated: Patient and Spouse Education method: Medical illustrator Education comprehension: verbalized understanding, returned demonstration, and needs further education  HOME EXERCISE PROGRAM: Abdominal breathing, forward resonance  GOALS: Goals reviewed with patient? Yes  SHORT TERM GOALS: Target date: 06/23/2023  Pt will reduce throat clearing and coughing by 50% at  STG date using trained techniques Baseline: > 9, 6 respectively; 1/0 at STG date Goal status: MET  2.  Pt will employ abdominal breathing during  structured tasks with 80% accuracy given occasional min A  Baseline:  Goal status: MET  3.  Pt will employ abdominal breathing during structured conversation with 80% accuracy given occasional min A Baseline:  Goal status: MET  4.  Pt will maintain clear vocal quality in 5-10 minute structured conversations x2 given occasional min A  Baseline:  Goal status: MET    LONG TERM GOALS: Target date: 07/21/2023  Pt will reduce throat clearing and coughing by 75% at LTG date using trained techniques Baseline:  Goal status: IN PROGRESS  2.  Pt will employ abdominal breathing during unstructured conversation with 80% accuracy given rare min A Baseline:  Goal status: IN PROGRESS  3.  Pt will maintain clear vocal quality in 15+ minute unstructured conversations x2 given rare min A  Baseline:  Goal status: IN PROGRESS  4.  Pt will report improved communication effectiveness via PROM by 2 pts at last ST session  Baseline: CPIB=9 Goal status: IN PROGRESS  ASSESSMENT:  CLINICAL IMPRESSION: Patient is a 67 y.o. M who was seen today for dysphonia. PMHX significant for Multiple Myeloma, interstitial lung disease, and GERD. Conducted ongoing education and instruction of cough suppression techniques, relaxed diaphragmatic breathing, throat clear alternatives, and voice exercises, with improving carryover exhibited. Updated HEP for voice exercises. Pt continuing to benefit from skilled ST intervention, such as training vocal hygiene protocol and voice techniques to optimize vocal quality and communication effectiveness.   OBJECTIVE IMPAIRMENTS: include voice disorder. These impairments are limiting patient from effectively communicating at home and in community. Factors affecting potential to achieve goals and functional outcome are co-morbidities.  Patient will benefit from skilled SLP services to address above impairments and improve overall function.  REHAB POTENTIAL: Good  PLAN:  SLP FREQUENCY: 2x/week  SLP DURATION: 8 weeks  PLANNED INTERVENTIONS: Language facilitation, Environmental controls, Cueing hierachy, Internal/external aids, Functional tasks, Multimodal communication approach, SLP instruction and feedback, Compensatory strategies, Patient/family education, and Re-evaluation    Gracy Racer, CCC-SLP 07/15/2023, 9:32 AM

## 2023-07-16 ENCOUNTER — Inpatient Hospital Stay: Payer: Medicare Other | Attending: Hematology and Oncology

## 2023-07-16 DIAGNOSIS — C9000 Multiple myeloma not having achieved remission: Secondary | ICD-10-CM | POA: Diagnosis present

## 2023-07-16 LAB — CMP (CANCER CENTER ONLY)
ALT: 28 U/L (ref 0–44)
AST: 26 U/L (ref 15–41)
Albumin: 4.1 g/dL (ref 3.5–5.0)
Alkaline Phosphatase: 70 U/L (ref 38–126)
Anion gap: 5 (ref 5–15)
BUN: 19 mg/dL (ref 8–23)
CO2: 29 mmol/L (ref 22–32)
Calcium: 9.2 mg/dL (ref 8.9–10.3)
Chloride: 94 mmol/L — ABNORMAL LOW (ref 98–111)
Creatinine: 0.96 mg/dL (ref 0.61–1.24)
GFR, Estimated: >60 mL/min (ref >60)
Glucose, Bld: 90 mg/dL (ref 70–99)
Potassium: 4.2 mmol/L (ref 3.5–5.1)
Sodium: 128 mmol/L — ABNORMAL LOW (ref 135–145)
Total Bilirubin: 0.5 mg/dL (ref 0.3–1.2)
Total Protein: 8.1 g/dL (ref 6.5–8.1)

## 2023-07-16 LAB — CBC WITH DIFFERENTIAL (CANCER CENTER ONLY)
Abs Immature Granulocytes: 0.02 10*3/uL (ref 0.00–0.07)
Basophils Absolute: 0 10*3/uL (ref 0.0–0.1)
Basophils Relative: 0 %
Eosinophils Absolute: 0.2 10*3/uL (ref 0.0–0.5)
Eosinophils Relative: 2 %
HCT: 45.3 % (ref 39.0–52.0)
Hemoglobin: 15.2 g/dL (ref 13.0–17.0)
Immature Granulocytes: 0 %
Lymphocytes Relative: 15 %
Lymphs Abs: 1.7 10*3/uL (ref 0.7–4.0)
MCH: 30 pg (ref 26.0–34.0)
MCHC: 33.6 g/dL (ref 30.0–36.0)
MCV: 89.3 fL (ref 80.0–100.0)
Monocytes Absolute: 0.7 10*3/uL (ref 0.1–1.0)
Monocytes Relative: 6 %
Neutro Abs: 8.4 10*3/uL — ABNORMAL HIGH (ref 1.7–7.7)
Neutrophils Relative %: 77 %
Platelet Count: 217 10*3/uL (ref 150–400)
RBC: 5.07 MIL/uL (ref 4.22–5.81)
RDW: 13.8 % (ref 11.5–15.5)
WBC Count: 11 10*3/uL — ABNORMAL HIGH (ref 4.0–10.5)
nRBC: 0 % (ref 0.0–0.2)

## 2023-07-16 LAB — LACTATE DEHYDROGENASE: LDH: 248 U/L — ABNORMAL HIGH (ref 98–192)

## 2023-07-18 LAB — KAPPA/LAMBDA LIGHT CHAINS
Kappa free light chain: 30.5 mg/L — ABNORMAL HIGH (ref 3.3–19.4)
Kappa, lambda light chain ratio: 1.98 — ABNORMAL HIGH (ref 0.26–1.65)
Lambda free light chains: 15.4 mg/L (ref 5.7–26.3)

## 2023-07-20 LAB — MULTIPLE MYELOMA PANEL, SERUM
Albumin SerPl Elph-Mcnc: 3.6 g/dL (ref 2.9–4.4)
Albumin/Glob SerPl: 1 (ref 0.7–1.7)
Alpha 1: 0.3 g/dL (ref 0.0–0.4)
Alpha2 Glob SerPl Elph-Mcnc: 1 g/dL (ref 0.4–1.0)
B-Globulin SerPl Elph-Mcnc: 1.1 g/dL (ref 0.7–1.3)
Gamma Glob SerPl Elph-Mcnc: 1.7 g/dL (ref 0.4–1.8)
Globulin, Total: 4 g/dL — ABNORMAL HIGH (ref 2.2–3.9)
IgA: 264 mg/dL (ref 61–437)
IgG (Immunoglobin G), Serum: 1875 mg/dL — ABNORMAL HIGH (ref 603–1613)
IgM (Immunoglobulin M), Srm: 117 mg/dL (ref 20–172)
M Protein SerPl Elph-Mcnc: 0.9 g/dL — ABNORMAL HIGH
Total Protein ELP: 7.6 g/dL (ref 6.0–8.5)

## 2023-07-23 ENCOUNTER — Ambulatory Visit: Payer: Medicare Other | Attending: Family Medicine

## 2023-07-23 DIAGNOSIS — R498 Other voice and resonance disorders: Secondary | ICD-10-CM | POA: Diagnosis present

## 2023-07-23 IMAGING — DX DG BONE SURVEY MET
9 of 10 series · 9 of 10 positions shown · non-contrast
Comparison: CT chest 01/06/2022. Chest x-ray 08/28/2021. Lumbar
spine 05/11/2021. Bone survey 12/22/2020.

CLINICAL DATA: Multiple myeloma.

EXAM:
METASTATIC BONE SURVEY

[skull lat]
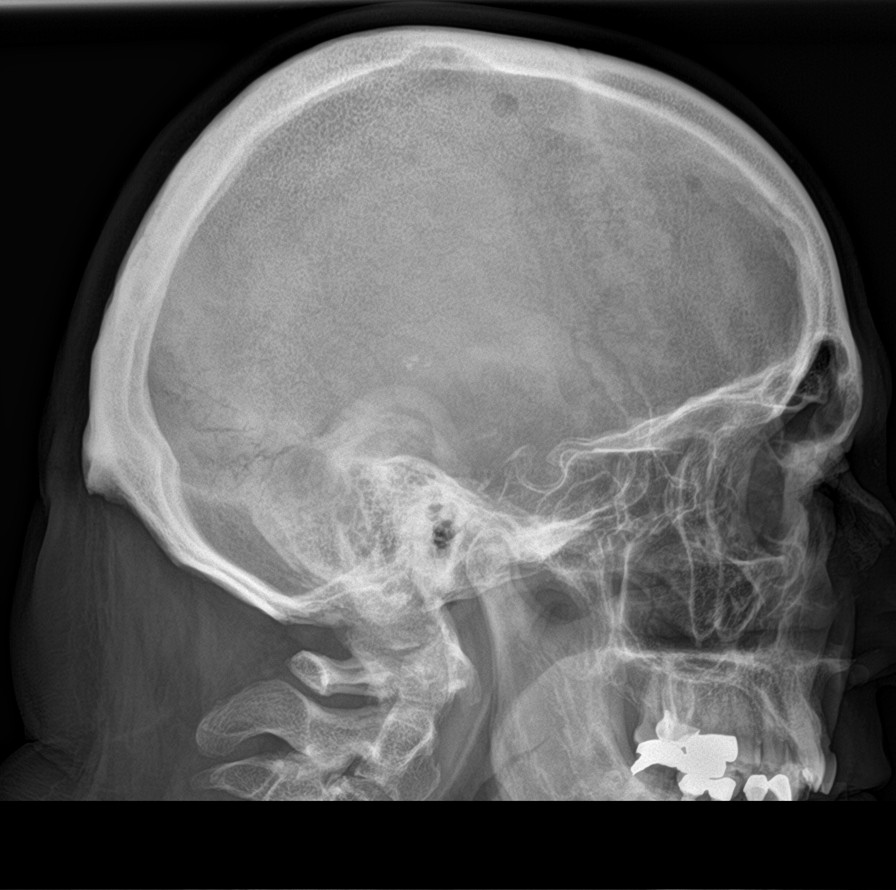

[shoulder ap (1 of 2)]
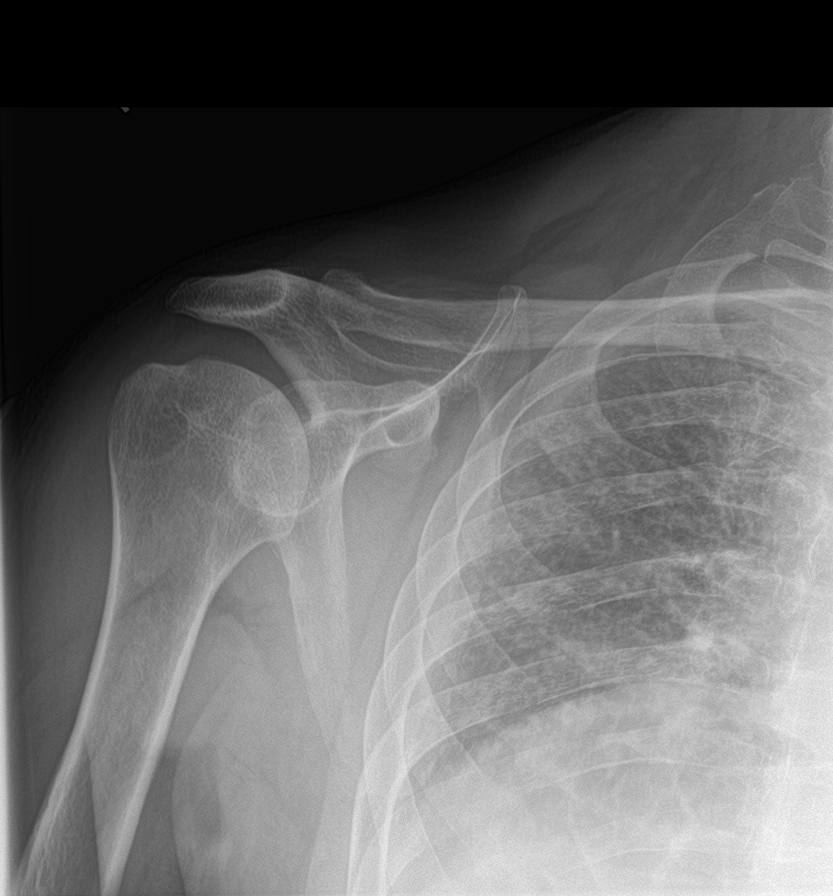

[shoulder ap (2 of 2)]
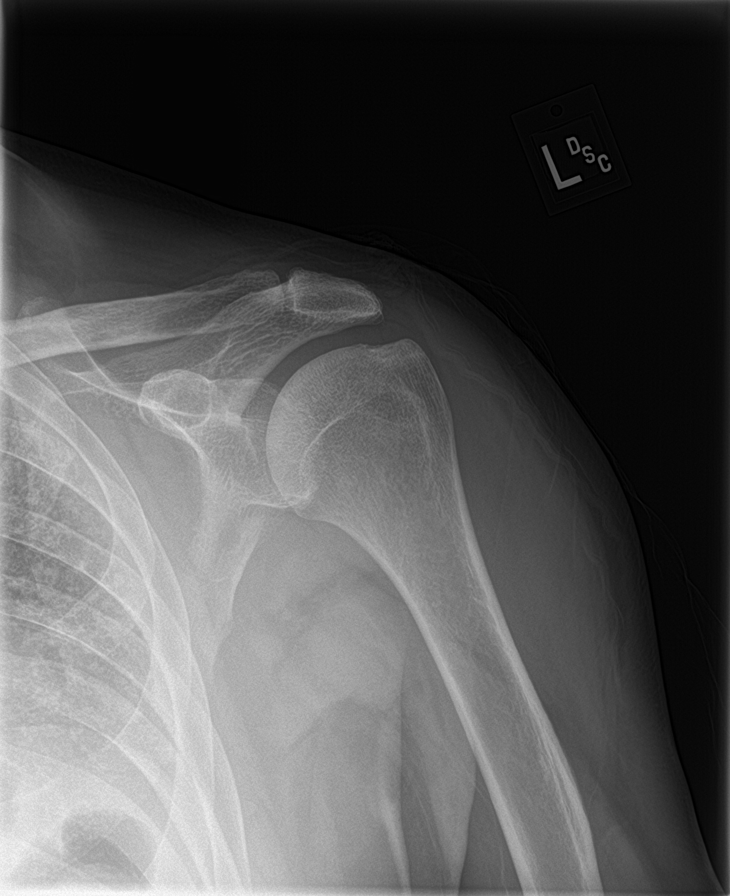

[humerus ap (1 of 2)]
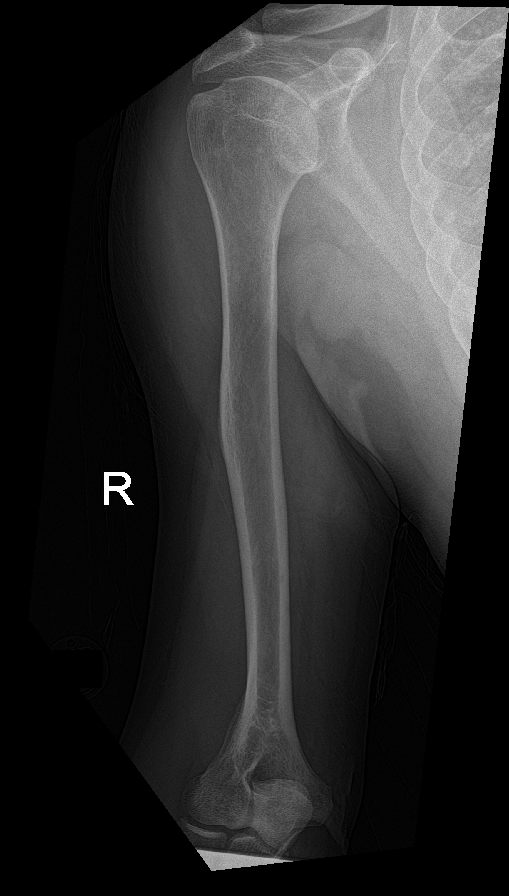

[humerus ap (2 of 2)]
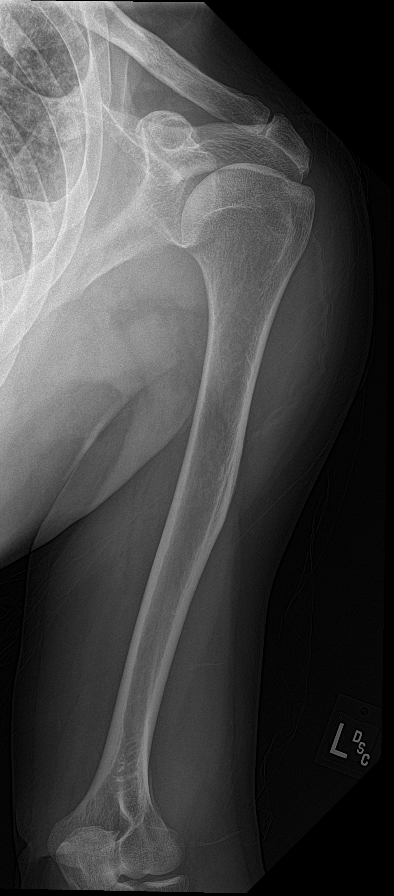

[forearm ap (1 of 2)]
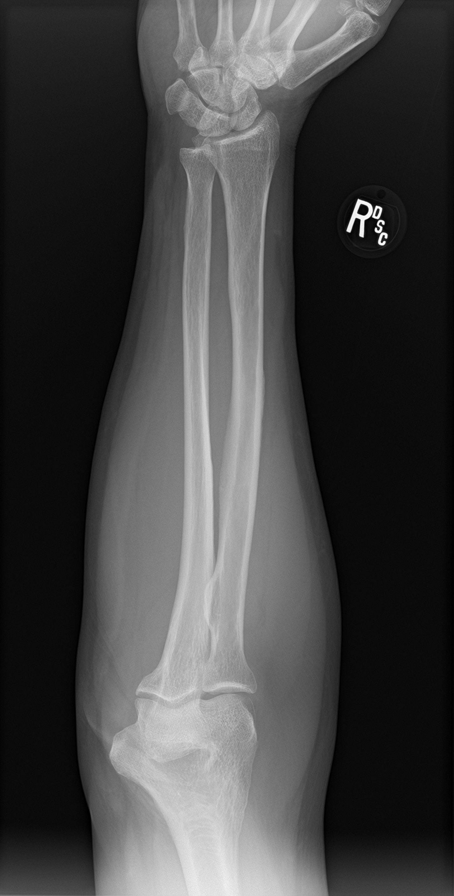

[forearm ap (2 of 2)]
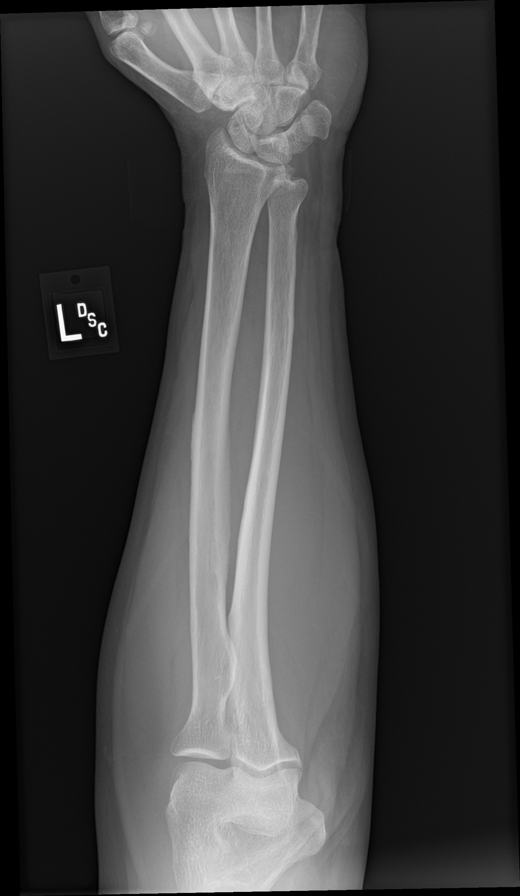

[c-spine ap]
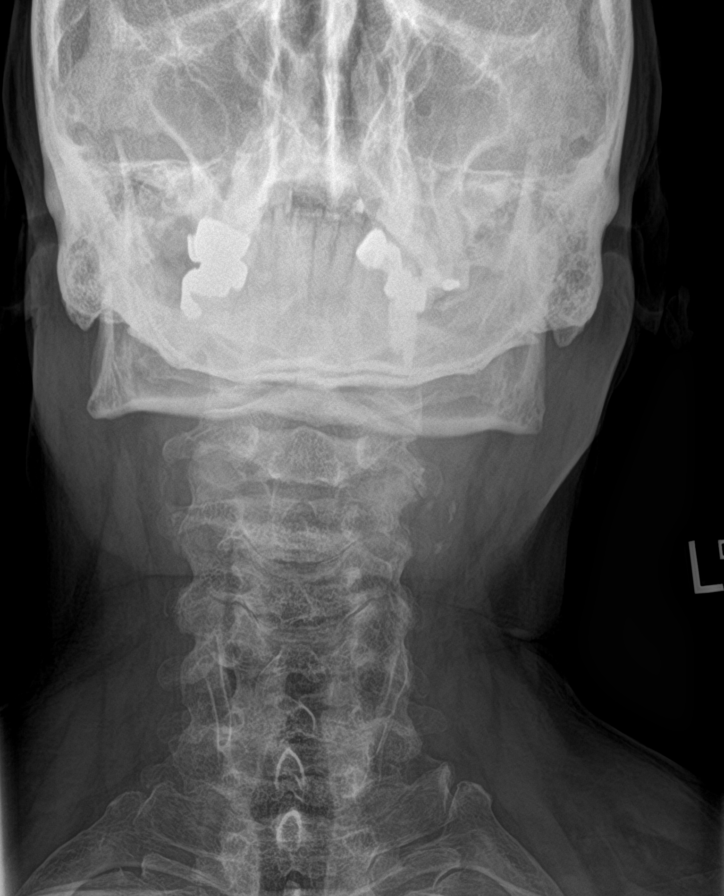

[c-spine lat]
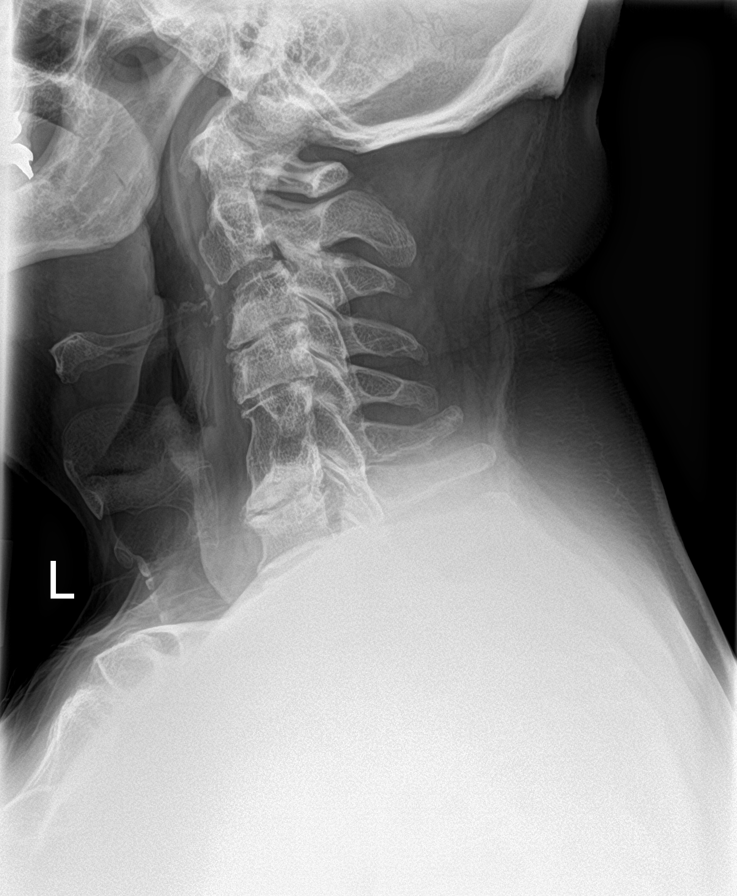

[9 of 10 positions shown; findings below may reference images not displayed]

FINDINGS: Standard imaging of the axial and appendicular skeleton. Lytic
lesions in the skull appear to have slightly progressed in size.
Subtle lytic lesion in the proximal left humeral diaphysis cannot be
excluded. Lytic lesions in the pelvis are again most likely present
but difficult to evaluate due to overlying bowel gas. Stable mild
compression fracture lower thoracic vertebral body. Diffuse
multifocal degenerative change noted throughout the axial and
appendicular skeleton. Low lung volumes with bibasilar atelectasis.
Chronic interstitial changes again noted. Aortoiliac and carotid
atherosclerotic vascular calcification again noted.
IMPRESSION: Lytic lesions in the skull appear to slightly progressed in size.
Subtle lytic lesion in the proximal left humeral diaphysis cannot be
excluded. Lytic lesions in the pelvis are again most likely present
but difficult to evaluate due to overlying bowel gas.

## 2023-07-23 NOTE — Therapy (Signed)
OUTPATIENT SPEECH LANGUAGE PATHOLOGY VOICE TREATMENT ([facility])   Patient Name: Samuel [name] MRN: [id number] DOB:02-24-1956, 67 y.o., male Today's Date: 07/23/2023  PCP: Tally Joe, MD REFERRING PROVIDER: Tally Joe, MD  END OF SESSION:  End of Session - 07/23/23 0935     Visit Number 14    Number of Visits 20    Date for SLP Re-Evaluation 09/03/23    Authorization Type medicare/ BC[employer]    SLP Start Time [phone]   late arrival   SLP Stop Time  0930    SLP Time Calculation (min) 38 min    Activity Tolerance Patient tolerated treatment well                 Past Medical History:  Diagnosis Date   Asthma    GERD (gastroesophageal reflux disease)    High cholesterol    under control.    History of blood transfusion    Hypothyroidism    Interstitial lung disease (HCC)    Multiple myeloma (HCC)    Perennial allergic rhinitis    Pneumonia    Sciatic pain, right    Seasonal allergic rhinitis    Thyroid disease    Past Surgical History:  Procedure Laterality Date   BRONCHIAL BIOPSY  06/18/2021   Procedure: BRONCHIAL BIOPSIES;  Surgeon: Leslye Peer, MD;  Location: [facility];  Service: Cardiopulmonary;;   BRONCHIAL BIOPSY  08/28/2021   Procedure: BRONCHIAL BIOPSIES;  Surgeon: Josephine Igo, DO;  Location: [facility];  Service: Cardiopulmonary;;   BRONCHIAL WASHINGS  06/18/2021   Procedure: BRONCHIAL WASHINGS;  Surgeon: Leslye Peer, MD;  Location: [facility];  Service: Cardiopulmonary;;   BRONCHIAL WASHINGS  08/28/2021   Procedure: BRONCHIAL WASHINGS;  Surgeon: Josephine Igo, DO;  Location: [facility];  Service: Cardiopulmonary;;   NASAL SINUS SURGERY     NECK SURGERY  1996   VIDEO BRONCHOSCOPY N/A 06/18/2021   Procedure: VIDEO BRONCHOSCOPY WITH FLUORO;  Surgeon: Leslye Peer, MD;  Location: [facility];  Service: Cardiopulmonary;  Laterality: N/A;   VIDEO BRONCHOSCOPY N/A 08/28/2021   Procedure: VIDEO BRONCHOSCOPY WITH FLUORO;  Surgeon: Josephine Igo, DO;  Location: [facility];  Service: Cardiopulmonary;  Laterality: N/A;   Patient Active Problem List   Diagnosis Date Noted   Essential hypertension 07/04/2023   Coronary artery disease 11/28/2022   Mixed hyperlipidemia 11/28/2022   Recurrent sinusitis 11/04/2022   Acute bacterial rhinosinusitis 09/26/2022   Chronic respiratory failure with hypoxia (HCC) 08/01/2022   S/P bronchoscopy    ILD (interstitial lung disease) (HCC)    Preop cardiovascular exam 08/21/2021   Pneumonitis 07/20/2021   Anxiety 07/20/2021   Interstitial pulmonary disease (HCC) 07/20/2021   Abnormal CT of the chest 06/18/2021   Dyspnea 06/15/2021   Hypoxia 06/15/2021   Multiple myeloma (HCC) 03/05/2021   Multiple myeloma not having achieved remission (HCC) 03/05/2021   Bone lesion 02/14/2021   Monoclonal gammopathy 02/14/2021   High cholesterol    Allergic rhinitis 11/23/2020   Mild persistent asthma 11/23/2020   SOB (shortness of breath) 10/04/2011    Onset date: 05/15/23 (referral date)  REFERRING DIAG: R49.0 (ICD-10-CM) - Dysphonia  THERAPY DIAG: Other voice and resonance disorders  Rationale for Evaluation and Treatment: Rehabilitation  SUBJECTIVE:   SUBJECTIVE STATEMENT: "I've been feeling stronger since last week." Accompanied by: self  PERTINENT HISTORY: "Athan Ulep is a 67 y.o. male who presents as a return patient, for follow-up of sinusitis and globus sensation. At time of last visit  on 03/14/2023, nasal culture was obtained following thick mucoid drainage noted in the right nasal cavity. This was abnormal, and demonstrated gram-positive bacilli. Patient was subsequently treated with a 2-week course of oral antibiotics, and presents today for posttreatment imaging. He has continued to use proton pump inhibitor twice daily, but continues to experience frequent throat clearing and globus sensation. He states he has been working on dietary and lifestyle modifications as previously  instructed, and has been partially successful. He still has difficulty with frequent throat clearing and needing to cough often. He also has not been able to obtain humidification for his oxygen, as the DME company stated they needed a prescription.  Last visit 03/14/2023: Safaree Berte is a 67 y.o. male who presents as a new consult, referred by [name], FNP , for evaluation and treatment of sinus issues, hoarseness, and throat symptoms. He is accompanied by his wife.  Patient had history of multiple myeloma, status post treatment with chemotherapy. He developed interstitial lung disease following chemotherapeutic treatment, and is currently followed closely by pulmonology. He also endorses history of significant environmental allergies, and reports that recent allergy testing showed positivity to "everything.". He was previously on immunotherapy, but this was held during his cancer treatment. He is considering starting back on the chemotherapy due to the significance of his symptoms. He is currently using Singulair, antihistamine and Patanase nasal spray on a daily basis. He also reports using nasal saline irrigations on a daily basis. He reports history of deviated nasal septum repair in 1983. He denies history of sinusitis requiring treatment with oral antibiotics in the last 12 months. His primary symptoms are nasal congestion and postnasal drainage. He denies facial pressure, pain, hyposmia, hypogeusia. He has not had any imaging of his head or sinuses. He does occasionally experience headaches. He uses oxygen on a nightly basis, and states that his oxygen is currently not humidified.  He reports symptoms of silent reflux, and states he is currently taking omeprazole on a daily basis. [name] is frequent throat clearing and globus sensation. He also reports occasional hoarseness."  PAIN:  Are you having pain? No  FALLS: Has patient fallen in last 6 months? No  LIVING  ENVIRONMENT: Lives with: lives with their family Lives in: House/apartment  PLOF:Level of assistance: Independent with ADLs, Independent with IADLs Employment: Full-time employment  PATIENT GOALS: "control my breath, control my speech, and relaxation"   OBJECTIVE:   DIAGNOSTIC FINDINGS: "Nasolaryngoscopy performed at last visit demonstrated moderate interarytenoid edema with otherwise normal upper airway exam. Counseled patient to continue twice daily omeprazole for next 2-3 months. We discussed dietary lifestyle modifications for treatment of reflux to include avoiding spicy and acidic foods which exacerbate reflux, avoiding late meals and snacking, avoiding caffeine and stimulants, avoiding carbonated and alcoholic beverages, adequate oral hydration, avoidance of throat clearing and sleeping with the head of bed elevated. Recommended follow-up with gastroenterology if symptoms do not improve after 3 months of twice daily PPI. Offered referral to speech-language pathology due to continued hoarseness and frequent throat clearing, patient was agreeable, order placed today."   OBJECTIVE VOICE ASSESSMENT: Maximum phonation time for sustained "ah": 71 (4-5 seconds max)  Conversational loudness average: 68 dB with intentional  Conversational loudness range: 65-70 dB S/z ratio: 1 but limited seconds (4 seconds) (Suggestive of dysfunction >1.0)  PATIENT REPORTED OUTCOME MEASURES (PROM): Communication Participation Item Bank: 9 "quite a bit" for all situations  but "very much" for getting turn in fast moving conversation  TODAY'S TREATMENT:  07-23-23: Entered with clear vocal quality and reported completing SOVT HEP intermittently. Targeted SOVT exercises to reduce tension in vocal folds and improve vocal quality. Aware he can sustain exhale longer than before during  exercises (averaged 6 sec for breath only). Completed hum, accents, and happy birthday into water given occasional fading to rare min A to relax upper body tension and increase breath support. Maintained 15 minute conversation with clear vocal quality and minimal upper body tension independently. Pt endorsed increasing awareness of body tension in the community; however, his wife occasionally cues him to relax which causes frustration. SLP counseled pt that the strategies he is learning will take time to master in the community. No coughing or throat clearing observed throughout session.   07-15-23: Endorsed feeling weak over last week, which reflected in low intensity, breathy vocal quality. Instructed diaphragmatic breathing to optimize breath support, which pt able to demo with mod I. Requested repeat education of SOVT exercises d/t lack of completion. Occasional mod A provided to optimize labial seal, breath support, and intentional intensity. Improved vocal quality following warm up exercises. Pt able to demonstrate clear vocal quality with projection for short structured conversation. Demonstrated awareness of reducing intensity and clarity related to fatigue. Recommended prioritizing clear vocal quality for short durations. Encouraged patient to continue HEP.   07-09-23: Endorsed consuming meds/foods that increased reflux symptoms. Targeted SOVT exercises without water to reduce tension and improve vocal quality. Pt required occasional min A to optimize lip seal around straw. Achieved success with sustained hum, glides, accents, and song with occasional mod cues to reset breath support and send voice through the straw. SLP educated pt on subsystems of voicing to increase pt understanding. Updated HEP to include SOVT with handout provided. Pt reporting subjective improvements in voice/cough and appreciation for SLP team assistance. Affirmed positive progress exhibited thus far.   07-07-23: Pt endorsed  improved vocal quality during HEP tasks (see subjective statement). Targeted sustained phonation to increase vocal quality and intensity with optimal breath support. Pt maintained high 70s dB on sustained /a/ for 4-5 seconds with occasional min A to decrease upper body tension. Noted increase in tension as pt attempted to increase volume. SLP introduced and modeled flow phonation to reduce muscle tension. Pt demonstrated flow phonation structured tasks with occasional mod A to reset his breath and send his voice forward. Noted pt needed three to five abdominal breaths between repetitions to regain adequate breathe support. Rare throat clearing and coughing observed throughout the session with occasional min A to use cough suppressant strategies.   07-02-23: Endorsed baseline breathiness today. Recently received steroid injection/prescription from allergist. Provided rec for increased hydration and use of humidification system d/t complaint of frequent dry throat. Targeted increased vocal intensity and clarity via sustained phonation exercises and pitch glides. Intermittent mod A required to optimize breath support and projection of voice to improve vocal quality. Provided as HEP. In subsequent conversation, pt presented with variations in vocal quality (hoarse versus clear), with pt able to project for improved clarity with usual min A.   06-25-23: Pt exhibiting increasingly independent carryover of cough suppression techniques, throat clear alternatives, and diaphragmatic breathing. Occasional non-verbal min A required to reduce upper body tension and use abdominal breath at the conversational level. No coughing evidenced throughout the session, infrequent throat clears followed by a sip of water. Vocal quality and intensity appeared to be impacted by general fatigue; however, able to demonstrate clear voice with SLP model.   06-24-23: Endorsed some overall  improvements in coughing and voice. Endorsed significant  coughing attack s/p exercise. Deduced coughing attack prompted by reduced intentional breath support and type of exercise (plank). Provided instruction and demo of inhale/exhale on exertion to reduce VCD during exercise and pre-medicating with learned breathing technique prior to initiating conversation and physical movement. Pt only exhibited cough x1 today and no throat clearing evidenced. Pt able to suppress cough/throat clear x3 independently. Demonstrates improved vocal quality with focus on diaphragmatic breathing and awareness of upper body tension in structured conversation.   06/18/23: Demonstrating improved awareness and correction of upper body tension. Minimal throat clearing observed throughout the session. Implementing throat clear alternatives with mod I. Targeted reduction of body tension and increased breath support during structured reading task. Able to reduce upper body tension and use abdominal breathing in sentences and short paragraphs with occasional fading min A. Intermittent but mild coughing attacks noted with occasional cues required to suppress. Updated HEP to include structured reading practice.   06/17/23: See subjective statement - cues effective to reduce upper body tension and use diaphragmatic breathing when heightened stress exhibite. Suggested setting intent with abdominal breaths x3 prior to answering phone. Rare coughing x2, which subsided with mod A to reduce tension and focus on breathing. Targeted conversation today with intermittent min A required to reduce shoulder tension, focus on abdominal breath with hand on abdomen, and project voice forward. Utilize fidget aid to aid relaxation with mild benefit.   06/11/23: Reported positive affirmations from colleagues re: improved voice quality and decreased coughing. Today, occasional min A required to ID and reduce upper body tension, use breathing techniques to suppress cough, and increase vocal intensity in conversation.  Provided recommendations to aid awareness and mitigation of cough based on initial symptoms and how to manage breath support during transitions periods.   06/09/23: Reported good carryover of trained techniques to suppress cough/throat clearing with benefit endorsed. Pt able to independently reduce/suppress cough/throat clear with ~60% accuracy this session. Occasional min A otherwise needed, mostly to relax upper body. Trialed SOVT and resonant exercises, which were limited d/t breath support and cyclical nature of VCD. Targeted "clear" voice with cue to speak out, with benefit achieved at phrase level. Plan to address next session.   06/06/23: Reviewed cyclical nature of VCD/chronic throat clearing or coughing. SLP answered pt's questions regarding role of reflux. Pt able to teach back strategies to avoid chronic throat clearing: relaxing shoulders, neck, chest, sniff and blow, hard swallow, water sips. Addressed use of strategies during conversational speech, as pt reporting can be challenging to engage in work meeting/appointments when occurring. Pt demonstrating x1 coughing episode, lasting ~20 seconds, responsive to SLP cues to use water and hard swallow. X6 throat clears, which was minimal compared to instances in which pt demonstrated mod-I employment of alternative. Generated strategy to further address stress in regards to pt perceived trigger for increasing upper body tension.   06-04-23: Pt entered with cough which persisted throughout session. Based on reported s/sx and triggers, SLP suspects chronic cough/VCD. Provided education and handout re: VCD and throat clear alternatives. Trained patient in diaphragmatic breathing and sniff-sniff-blow technique to suppress cough, in which pt able to demo with usual fading to occasional min A. Guided patient in self-analysis of upper body tension given usual tension present in chest/neck. Tension resulted in breath hold during inhale/exhale cycle. Breathing  pattern improved with use of trained techniques. Trialed SOVTE, which was impacted by impaired breath support. Will incorporate as able.   05-26-23: Briefly  discussed 2 sub-systems of voicing (respiratory and phonatory). Dicussed plan to further target diaphragmatic breathing, forward projection of voice, and reduction in throat clears to optimize vocal quality. Pt verbalized understanding and denied further questions at this time.    PATIENT EDUCATION: Education details: see above  Person educated: Patient and Spouse Education method: Medical illustrator Education comprehension: verbalized understanding, returned demonstration, and needs further education  HOME EXERCISE PROGRAM: Abdominal breathing, forward resonance  GOALS: Goals reviewed with patient? Yes  SHORT TERM GOALS: Target date: 06/23/2023  Pt will reduce throat clearing and coughing by 50% at STG date using trained techniques Baseline: > 9, 6 respectively; 1/0 at STG date Goal status: MET  2.  Pt will employ abdominal breathing during structured tasks with 80% accuracy given occasional min A  Baseline:  Goal status: MET  3.  Pt will employ abdominal breathing during structured conversation with 80% accuracy given occasional min A Baseline:  Goal status: MET  4.  Pt will maintain clear vocal quality in 5-10 minute structured conversations x2 given occasional min A  Baseline:  Goal status: MET    LONG TERM GOALS: Target date: 07/21/2023 (09/03/2023 for recert)   Pt will reduce throat clearing and coughing by 75% at LTG date using trained techniques Baseline:  Goal status: MET  2.  Pt will employ abdominal breathing during unstructured conversation with 80% accuracy given rare min A Baseline:  Goal status: MET  3.  Pt will maintain clear vocal quality in 15+ minute unstructured conversations x2 given rare min A  Baseline: 07-23-23 Goal status: IN PROGRESS (at recert)  4.  Pt will carryover trained voice  techniques during community outings x2 with less frequent cues required by wife  Baseline:  Goal status: NEW (at recert)  5.  Pt will report improved communication effectiveness via PROM by 2 pts at last ST session  Baseline: CPIB=9 Goal status: IN PROGRESS (at recert)  ASSESSMENT:  CLINICAL IMPRESSION: Patient is a 67 y.o. M who was seen today for dysphonia. PMHX significant for Multiple Myeloma, interstitial lung disease, and GERD. Conducted ongoing education and instruction of relaxed diaphragmatic breathing and vocal techniques, with improving carryover exhibited for longer utterances and increasingly into discourse. No coughing or throat clearing evidenced today, which is significant progress compared to initial sessions. Pt continuing to benefit from skilled ST intervention, such as training vocal hygiene protocol and voice techniques to optimize vocal quality and communication effectiveness.   OBJECTIVE IMPAIRMENTS: include voice disorder. These impairments are limiting patient from effectively communicating at home and in community. Factors affecting potential to achieve goals and functional outcome are co-morbidities. Patient will benefit from skilled SLP services to address above impairments and improve overall function.  REHAB POTENTIAL: Good  PLAN:  SLP FREQUENCY: 2x/week (decrease to 1x/week)  SLP DURATION: 8 weeks (+ 6 weeks for recert)  PLANNED INTERVENTIONS: Language facilitation, Environmental controls, Cueing hierachy, Internal/external aids, Functional tasks, Multimodal communication approach, SLP instruction and feedback, Compensatory strategies, Patient/family education, and Re-evaluation    Gracy Racer, CCC-SLP 07/23/2023, 12:58 PM

## 2023-07-28 NOTE — Therapy (Unsigned)
OUTPATIENT SPEECH LANGUAGE PATHOLOGY VOICE TREATMENT (RECERT)   Patient Name: Samuel [name] MRN: [id number] DOB:08-13-1956, 67 y.o., male Today's Date: 07/29/2023  PCP: Tally Joe, MD REFERRING PROVIDER: Tally Joe, MD  END OF SESSION:  End of Session - 07/29/23 1231     Visit Number 15    Number of Visits 20    Date for SLP Re-Evaluation 09/03/23    Authorization Type medicare/ BCBS    SLP Start Time 1232    SLP Stop Time  1315    SLP Time Calculation (min) 43 min    Activity Tolerance Patient tolerated treatment well                  Past Medical History:  Diagnosis Date   Asthma    GERD (gastroesophageal reflux disease)    High cholesterol    under control.    History of blood transfusion    Hypothyroidism    Interstitial lung disease (HCC)    Multiple myeloma (HCC)    Perennial allergic rhinitis    Pneumonia    Sciatic pain, right    Seasonal allergic rhinitis    Thyroid disease    Past Surgical History:  Procedure Laterality Date   BRONCHIAL BIOPSY  06/18/2021   Procedure: BRONCHIAL BIOPSIES;  Surgeon: Leslye Peer, MD;  Location: [facility];  Service: Cardiopulmonary;;   BRONCHIAL BIOPSY  08/28/2021   Procedure: BRONCHIAL BIOPSIES;  Surgeon: Josephine Igo, DO;  Location: [facility];  Service: Cardiopulmonary;;   BRONCHIAL WASHINGS  06/18/2021   Procedure: BRONCHIAL WASHINGS;  Surgeon: Leslye Peer, MD;  Location: [facility];  Service: Cardiopulmonary;;   BRONCHIAL WASHINGS  08/28/2021   Procedure: BRONCHIAL WASHINGS;  Surgeon: Josephine Igo, DO;  Location: [facility];  Service: Cardiopulmonary;;   NASAL SINUS SURGERY     NECK SURGERY  1996   VIDEO BRONCHOSCOPY N/A 06/18/2021   Procedure: VIDEO BRONCHOSCOPY WITH FLUORO;  Surgeon: Leslye Peer, MD;  Location: [facility];  Service: Cardiopulmonary;  Laterality: N/A;   VIDEO BRONCHOSCOPY N/A 08/28/2021   Procedure: VIDEO BRONCHOSCOPY WITH FLUORO;  Surgeon: Josephine Igo,  DO;  Location: [facility];  Service: Cardiopulmonary;  Laterality: N/A;   Patient Active Problem List   Diagnosis Date Noted   Essential hypertension 07/04/2023   Coronary artery disease 11/28/2022   Mixed hyperlipidemia 11/28/2022   Recurrent sinusitis 11/04/2022   Acute bacterial rhinosinusitis 09/26/2022   Chronic respiratory failure with hypoxia (HCC) 08/01/2022   S/P bronchoscopy    ILD (interstitial lung disease) (HCC)    Preop cardiovascular exam 08/21/2021   Pneumonitis 07/20/2021   Anxiety 07/20/2021   Interstitial pulmonary disease (HCC) 07/20/2021   Abnormal CT of the chest 06/18/2021   Dyspnea 06/15/2021   Hypoxia 06/15/2021   Multiple myeloma (HCC) 03/05/2021   Multiple myeloma not having achieved remission (HCC) 03/05/2021   Bone lesion 02/14/2021   Monoclonal gammopathy 02/14/2021   High cholesterol    Allergic rhinitis 11/23/2020   Mild persistent asthma 11/23/2020   SOB (shortness of breath) 10/04/2011    Onset date: 05/15/23 (referral date)  REFERRING DIAG: R49.0 (ICD-10-CM) - Dysphonia  THERAPY DIAG: Other voice and resonance disorders  Rationale for Evaluation and Treatment: Rehabilitation  SUBJECTIVE:   SUBJECTIVE STATEMENT: "I'm really stuffy today. It's affecting my breathing a little."  Accompanied by: self  PERTINENT HISTORY: "Samuel [name] is a 67 y.o. male who presents as a return patient, for follow-up of sinusitis and globus sensation. At time of  last visit on 03/14/2023, nasal culture was obtained following thick mucoid drainage noted in the right nasal cavity. This was abnormal, and demonstrated gram-positive bacilli. Patient was subsequently treated with a 2-week course of oral antibiotics, and presents today for posttreatment imaging. He has continued to use proton pump inhibitor twice daily, but continues to experience frequent throat clearing and globus sensation. He states he has been working on dietary and lifestyle modifications as  previously instructed, and has been partially successful. He still has difficulty with frequent throat clearing and needing to cough often. He also has not been able to obtain humidification for his oxygen, as the DME company stated they needed a prescription.  Last visit 03/14/2023: Samuel [name] is a 67 y.o. male who presents as a new consult, referred by [name], FNP , for evaluation and treatment of sinus issues, hoarseness, and throat symptoms. He is accompanied by his wife.  Patient had history of multiple myeloma, status post treatment with chemotherapy. He developed interstitial lung disease following chemotherapeutic treatment, and is currently followed closely by pulmonology. He also endorses history of significant environmental allergies, and reports that recent allergy testing showed positivity to "everything.". He was previously on immunotherapy, but this was held during his cancer treatment. He is considering starting back on the chemotherapy due to the significance of his symptoms. He is currently using Singulair, antihistamine and Patanase nasal spray on a daily basis. He also reports using nasal saline irrigations on a daily basis. He reports history of deviated nasal septum repair in 1983. He denies history of sinusitis requiring treatment with oral antibiotics in the last 12 months. His primary symptoms are nasal congestion and postnasal drainage. He denies facial pressure, pain, hyposmia, hypogeusia. He has not had any imaging of his head or sinuses. He does occasionally experience headaches. He uses oxygen on a nightly basis, and states that his oxygen is currently not humidified.  He reports symptoms of silent reflux, and states he is currently taking omeprazole on a daily basis. [name] is frequent throat clearing and globus sensation. He also reports occasional hoarseness."  PAIN: Are you having pain? No  FALLS: Has patient fallen in last 6 months? No  LIVING  ENVIRONMENT: Lives with: lives with their family Lives in: House/apartment  PLOF:Level of assistance: Independent with ADLs, Independent with IADLs Employment: Full-time employment  PATIENT GOALS: "control my breath, control my speech, and relaxation"   OBJECTIVE:   PATIENT REPORTED OUTCOME MEASURES (PROM): Communication Participation Item Bank: 9 "quite a bit" for all situations  but "very much" for getting turn in fast moving conversation  TODAY'S TREATMENT:                                                                                                                                         07-29-23: Reported ongoing HEP completion. Endorsed overall improvement of SOVT exercises, although some days are better  than others. Targeted SOVTE to reduce vocal tension and improve clarity. Completed sustained exhale, hum, and glides into water with occasional min A to reduce upper body tension and take reset breaths. Introduced straw talking exercise with short phrases to improve forward resonance. Used negative practice to alternate between back-focused voice and forward resonance to improve awareness of vocal quality. Accurately read aloud a list of 8-9 word sentences using forward resonance with occasional min A. Back-focused voice more apparent at the end of sentences. Benefited from cues to break up long sentences with a pause and reset with a straw or hum.  07-23-23: Entered with clear vocal quality and reported completing SOVT HEP intermittently. Targeted SOVT exercises to reduce tension in vocal folds and improve vocal quality. Aware he can sustain exhale longer than before during exercises (averaged 6 sec for breath only). Completed hum, accents, and happy birthday into water given occasional fading to rare min A to relax upper body tension and increase breath support. Maintained 15 minute conversation with clear vocal quality and minimal upper body tension independently. Pt endorsed increasing  awareness of body tension in the community; however, his wife occasionally cues him to relax which causes frustration. SLP counseled pt that the strategies he is learning will take time to master in the community. No coughing or throat clearing observed throughout session.   07-15-23: Endorsed feeling weak over last week, which reflected in low intensity, breathy vocal quality. Instructed diaphragmatic breathing to optimize breath support, which pt able to demo with mod I. Requested repeat education of SOVT exercises d/t lack of completion. Occasional mod A provided to optimize labial seal, breath support, and intentional intensity. Improved vocal quality following warm up exercises. Pt able to demonstrate clear vocal quality with projection for short structured conversation. Demonstrated awareness of reducing intensity and clarity related to fatigue. Recommended prioritizing clear vocal quality for short durations. Encouraged patient to continue HEP.   07-09-23: Endorsed consuming meds/foods that increased reflux symptoms. Targeted SOVT exercises without water to reduce tension and improve vocal quality. Pt required occasional min A to optimize lip seal around straw. Achieved success with sustained hum, glides, accents, and song with occasional mod cues to reset breath support and send voice through the straw. SLP educated pt on subsystems of voicing to increase pt understanding. Updated HEP to include SOVT with handout provided. Pt reporting subjective improvements in voice/cough and appreciation for SLP team assistance. Affirmed positive progress exhibited thus far.   07-07-23: Pt endorsed improved vocal quality during HEP tasks (see subjective statement). Targeted sustained phonation to increase vocal quality and intensity with optimal breath support. Pt maintained high 70s dB on sustained /a/ for 4-5 seconds with occasional min A to decrease upper body tension. Noted increase in tension as pt attempted to  increase volume. SLP introduced and modeled flow phonation to reduce muscle tension. Pt demonstrated flow phonation structured tasks with occasional mod A to reset his breath and send his voice forward. Noted pt needed three to five abdominal breaths between repetitions to regain adequate breathe support. Rare throat clearing and coughing observed throughout the session with occasional min A to use cough suppressant strategies.   07-02-23: Endorsed baseline breathiness today. Recently received steroid injection/prescription from allergist. Provided rec for increased hydration and use of humidification system d/t complaint of frequent dry throat. Targeted increased vocal intensity and clarity via sustained phonation exercises and pitch glides. Intermittent mod A required to optimize breath support and projection of voice to improve vocal quality. Provided  as HEP. In subsequent conversation, pt presented with variations in vocal quality (hoarse versus clear), with pt able to project for improved clarity with usual min A.   06-25-23: Pt exhibiting increasingly independent carryover of cough suppression techniques, throat clear alternatives, and diaphragmatic breathing. Occasional non-verbal min A required to reduce upper body tension and use abdominal breath at the conversational level. No coughing evidenced throughout the session, infrequent throat clears followed by a sip of water. Vocal quality and intensity appeared to be impacted by general fatigue; however, able to demonstrate clear voice with SLP model.   06-24-23: Endorsed some overall improvements in coughing and voice. Endorsed significant coughing attack s/p exercise. Deduced coughing attack prompted by reduced intentional breath support and type of exercise (plank). Provided instruction and demo of inhale/exhale on exertion to reduce VCD during exercise and pre-medicating with learned breathing technique prior to initiating conversation and physical  movement. Pt only exhibited cough x1 today and no throat clearing evidenced. Pt able to suppress cough/throat clear x3 independently. Demonstrates improved vocal quality with focus on diaphragmatic breathing and awareness of upper body tension in structured conversation.   06/18/23: Demonstrating improved awareness and correction of upper body tension. Minimal throat clearing observed throughout the session. Implementing throat clear alternatives with mod I. Targeted reduction of body tension and increased breath support during structured reading task. Able to reduce upper body tension and use abdominal breathing in sentences and short paragraphs with occasional fading min A. Intermittent but mild coughing attacks noted with occasional cues required to suppress. Updated HEP to include structured reading practice.   06/17/23: See subjective statement - cues effective to reduce upper body tension and use diaphragmatic breathing when heightened stress exhibite. Suggested setting intent with abdominal breaths x3 prior to answering phone. Rare coughing x2, which subsided with mod A to reduce tension and focus on breathing. Targeted conversation today with intermittent min A required to reduce shoulder tension, focus on abdominal breath with hand on abdomen, and project voice forward. Utilize fidget aid to aid relaxation with mild benefit.   06/11/23: Reported positive affirmations from colleagues re: improved voice quality and decreased coughing. Today, occasional min A required to ID and reduce upper body tension, use breathing techniques to suppress cough, and increase vocal intensity in conversation. Provided recommendations to aid awareness and mitigation of cough based on initial symptoms and how to manage breath support during transitions periods.   06/09/23: Reported good carryover of trained techniques to suppress cough/throat clearing with benefit endorsed. Pt able to independently reduce/suppress  cough/throat clear with ~60% accuracy this session. Occasional min A otherwise needed, mostly to relax upper body. Trialed SOVT and resonant exercises, which were limited d/t breath support and cyclical nature of VCD. Targeted "clear" voice with cue to speak out, with benefit achieved at phrase level. Plan to address next session.   06/06/23: Reviewed cyclical nature of VCD/chronic throat clearing or coughing. SLP answered pt's questions regarding role of reflux. Pt able to teach back strategies to avoid chronic throat clearing: relaxing shoulders, neck, chest, sniff and blow, hard swallow, water sips. Addressed use of strategies during conversational speech, as pt reporting can be challenging to engage in work meeting/appointments when occurring. Pt demonstrating x1 coughing episode, lasting ~20 seconds, responsive to SLP cues to use water and hard swallow. X6 throat clears, which was minimal compared to instances in which pt demonstrated mod-I employment of alternative. Generated strategy to further address stress in regards to pt perceived trigger for increasing upper body  tension.   06-04-23: Pt entered with cough which persisted throughout session. Based on reported s/sx and triggers, SLP suspects chronic cough/VCD. Provided education and handout re: VCD and throat clear alternatives. Trained patient in diaphragmatic breathing and sniff-sniff-blow technique to suppress cough, in which pt able to demo with usual fading to occasional min A. Guided patient in self-analysis of upper body tension given usual tension present in chest/neck. Tension resulted in breath hold during inhale/exhale cycle. Breathing pattern improved with use of trained techniques. Trialed SOVTE, which was impacted by impaired breath support. Will incorporate as able.    PATIENT EDUCATION: Education details: see above  Person educated: Patient and Spouse Education method: Medical illustrator Education comprehension:  verbalized understanding, returned demonstration, and needs further education  HOME EXERCISE PROGRAM: Abdominal breathing, forward resonance  GOALS: Goals reviewed with patient? Yes  SHORT TERM GOALS: Target date: 06/23/2023  Pt will reduce throat clearing and coughing by 50% at STG date using trained techniques Baseline: > 9, 6 respectively; 1/0 at STG date Goal status: MET  2.  Pt will employ abdominal breathing during structured tasks with 80% accuracy given occasional min A  Baseline:  Goal status: MET  3.  Pt will employ abdominal breathing during structured conversation with 80% accuracy given occasional min A Baseline:  Goal status: MET  4.  Pt will maintain clear vocal quality in 5-10 minute structured conversations x2 given occasional min A  Baseline:  Goal status: MET    LONG TERM GOALS: Target date: 07/21/2023 (09/03/2023 for recert)   Pt will reduce throat clearing and coughing by 75% at LTG date using trained techniques Baseline:  Goal status: MET  2.  Pt will employ abdominal breathing during unstructured conversation with 80% accuracy given rare min A Baseline:  Goal status: MET  3.  Pt will maintain clear vocal quality in 15+ minute unstructured conversations x2 given rare min A  Baseline: 07-23-23 Goal status: IN PROGRESS (at recert)  4.  Pt will carryover trained voice techniques during community outings x2 with less frequent cues required by wife  Baseline:  Goal status: NEW (at recert)  5.  Pt will report improved communication effectiveness via PROM by 2 pts at last ST session  Baseline: CPIB=9 Goal status: IN PROGRESS (at recert)  ASSESSMENT:  CLINICAL IMPRESSION: Patient is a 67 y.o. M who was seen today for dysphonia. PMHX significant for Multiple Myeloma, interstitial lung disease, and GERD. Conducted ongoing education and instruction of relaxed diaphragmatic breathing and vocal techniques, with improving carryover exhibited for longer  utterances and increasingly into discourse. Rare throat clearing and coughing observed today, likely due to reports of congestion and potential oncoming cold. Pt continuing to benefit from skilled ST intervention, such as training vocal hygiene protocol and voice techniques to optimize vocal quality and communication effectiveness.   OBJECTIVE IMPAIRMENTS: include voice disorder. These impairments are limiting patient from effectively communicating at home and in community. Factors affecting potential to achieve goals and functional outcome are co-morbidities. Patient will benefit from skilled SLP services to address above impairments and improve overall function.  REHAB POTENTIAL: Good  PLAN:  SLP FREQUENCY: 2x/week (decrease to 1x/week)  SLP DURATION: 8 weeks (+ 6 weeks for recert)  PLANNED INTERVENTIONS: Language facilitation, Environmental controls, Cueing hierachy, Internal/external aids, Functional tasks, Multimodal communication approach, SLP instruction and feedback, Compensatory strategies, Patient/family education, and Re-evaluation    Delany Loonam, Student-SLP 07/29/2023, 2:05 PM

## 2023-07-29 ENCOUNTER — Ambulatory Visit: Payer: Medicare Other

## 2023-07-29 DIAGNOSIS — R498 Other voice and resonance disorders: Secondary | ICD-10-CM | POA: Diagnosis not present

## 2023-07-31 ENCOUNTER — Other Ambulatory Visit: Payer: Self-pay | Admitting: Hematology and Oncology

## 2023-08-06 ENCOUNTER — Ambulatory Visit: Payer: Medicare Other

## 2023-08-06 DIAGNOSIS — R498 Other voice and resonance disorders: Secondary | ICD-10-CM | POA: Diagnosis not present

## 2023-08-06 NOTE — Therapy (Signed)
OUTPATIENT SPEECH LANGUAGE PATHOLOGY VOICE TREATMENT (RECERT)   Patient Name: Samuel [name] MRN: [id number] DOB:10-Jul-1956, 67 y.o., male Today's Date: 08/06/2023  PCP: Tally Joe, MD REFERRING PROVIDER: Tally Joe, MD  END OF SESSION:  End of Session - 08/06/23 0934     Visit Number 16    Number of Visits 20    Date for SLP Re-Evaluation 09/03/23    Authorization Type medicare/ BC[employer]    SLP Start Time [phone]    SLP Stop Time  1015    SLP Time Calculation (min) 42 min    Activity Tolerance Patient tolerated treatment well                   Past Medical History:  Diagnosis Date   Asthma    GERD (gastroesophageal reflux disease)    High cholesterol    under control.    History of blood transfusion    Hypothyroidism    Interstitial lung disease (HCC)    Multiple myeloma (HCC)    Perennial allergic rhinitis    Pneumonia    Sciatic pain, right    Seasonal allergic rhinitis    Thyroid disease    Past Surgical History:  Procedure Laterality Date   BRONCHIAL BIOPSY  06/18/2021   Procedure: BRONCHIAL BIOPSIES;  Surgeon: Leslye Peer, MD;  Location: [facility];  Service: Cardiopulmonary;;   BRONCHIAL BIOPSY  08/28/2021   Procedure: BRONCHIAL BIOPSIES;  Surgeon: Josephine Igo, DO;  Location: [facility];  Service: Cardiopulmonary;;   BRONCHIAL WASHINGS  06/18/2021   Procedure: BRONCHIAL WASHINGS;  Surgeon: Leslye Peer, MD;  Location: [facility];  Service: Cardiopulmonary;;   BRONCHIAL WASHINGS  08/28/2021   Procedure: BRONCHIAL WASHINGS;  Surgeon: Josephine Igo, DO;  Location: [facility];  Service: Cardiopulmonary;;   NASAL SINUS SURGERY     NECK SURGERY  1996   VIDEO BRONCHOSCOPY N/A 06/18/2021   Procedure: VIDEO BRONCHOSCOPY WITH FLUORO;  Surgeon: Leslye Peer, MD;  Location: [facility];  Service: Cardiopulmonary;  Laterality: N/A;   VIDEO BRONCHOSCOPY N/A 08/28/2021   Procedure: VIDEO BRONCHOSCOPY WITH FLUORO;  Surgeon: Josephine Igo, DO;  Location: [facility];  Service: Cardiopulmonary;  Laterality: N/A;   Patient Active Problem List   Diagnosis Date Noted   Essential hypertension 07/04/2023   Coronary artery disease 11/28/2022   Mixed hyperlipidemia 11/28/2022   Recurrent sinusitis 11/04/2022   Acute bacterial rhinosinusitis 09/26/2022   Chronic respiratory failure with hypoxia (HCC) 08/01/2022   S/P bronchoscopy    ILD (interstitial lung disease) (HCC)    Preop cardiovascular exam 08/21/2021   Pneumonitis 07/20/2021   Anxiety 07/20/2021   Interstitial pulmonary disease (HCC) 07/20/2021   Abnormal CT of the chest 06/18/2021   Dyspnea 06/15/2021   Hypoxia 06/15/2021   Multiple myeloma (HCC) 03/05/2021   Multiple myeloma not having achieved remission (HCC) 03/05/2021   Bone lesion 02/14/2021   Monoclonal gammopathy 02/14/2021   High cholesterol    Allergic rhinitis 11/23/2020   Mild persistent asthma 11/23/2020   SOB (shortness of breath) 10/04/2011    Onset date: 05/15/23 (referral date)  REFERRING DIAG: R49.0 (ICD-10-CM) - Dysphonia  THERAPY DIAG: Other voice and resonance disorders  Rationale for Evaluation and Treatment: Rehabilitation  SUBJECTIVE:   SUBJECTIVE STATEMENT: "I lost my homework but I still tried to practice."  Accompanied by: self  PERTINENT HISTORY: "Samuel [name] is a 67 y.o. male who presents as a return patient, for follow-up of sinusitis and globus sensation. At time  of last visit on 03/14/2023, nasal culture was obtained following thick mucoid drainage noted in the right nasal cavity. This was abnormal, and demonstrated gram-positive bacilli. Patient was subsequently treated with a 2-week course of oral antibiotics, and presents today for posttreatment imaging. He has continued to use proton pump inhibitor twice daily, but continues to experience frequent throat clearing and globus sensation. He states he has been working on dietary and lifestyle modifications as previously  instructed, and has been partially successful. He still has difficulty with frequent throat clearing and needing to cough often. He also has not been able to obtain humidification for his oxygen, as the DME company stated they needed a prescription.  Last visit 03/14/2023: Samuel [name] is a 67 y.o. male who presents as a new consult, referred by [name], FNP , for evaluation and treatment of sinus issues, hoarseness, and throat symptoms. He is accompanied by his wife.  Patient had history of multiple myeloma, status post treatment with chemotherapy. He developed interstitial lung disease following chemotherapeutic treatment, and is currently followed closely by pulmonology. He also endorses history of significant environmental allergies, and reports that recent allergy testing showed positivity to "everything". He was previously on immunotherapy, but this was held during his cancer treatment. He is considering starting back on the chemotherapy due to the significance of his symptoms. He is currently using Singulair, antihistamine and Patanase nasal spray on a daily basis. He also reports using nasal saline irrigations on a daily basis. He reports history of deviated nasal septum repair in 1983. He denies history of sinusitis requiring treatment with oral antibiotics in the last 12 months. His primary symptoms are nasal congestion and postnasal drainage. He denies facial pressure, pain, hyposmia, hypogeusia. He has not had any imaging of his head or sinuses. He does occasionally experience headaches. He uses oxygen on a nightly basis, and states that his oxygen is currently not humidified.  He reports symptoms of silent reflux, and states he is currently taking omeprazole on a daily basis. [name] is frequent throat clearing and globus sensation. He also reports occasional hoarseness."  PAIN: Are you having pain? No  FALLS: Has patient fallen in last 6 months? No  LIVING ENVIRONMENT: Lives  with: lives with their family Lives in: House/apartment  PLOF:Level of assistance: Independent with ADLs, Independent with IADLs Employment: Full-time employment  PATIENT GOALS: "control my breath, control my speech, and relaxation"   OBJECTIVE:   PATIENT REPORTED OUTCOME MEASURES (PROM): Communication Participation Item Bank: 9 "quite a bit" for all situations  but "very much" for getting turn in fast moving conversation  TODAY'S TREATMENT:                                                                                                                                         08-06-23: Entered with hoarse vocal quality that improved independently, with pt stating that he focused  on using forward resonance. Maintained relaxed appearance, strong breath support, and clear vocal quality during 20-minute unstructured conversation. SLP directed pt to walk down the hall and return to therapy room to read a short passage x2 to target increased breath support and clear vocal quality in speech following exertion. Successfully completed task with occasional fading to rare min A, with improved breath support and vocal quality noted in the 2nd attempt. Benefited from cues to reset before beginning to read and take intentional pauses between sentences. Pt self-rated first attempt a 7.5 and second attempt an 8.5-9 out of 10. Addressed breath support during exertion by completing walking and talking task. Pt maintained structured conversation while completing laps around therapy gym with occasional mod fading to min A to take breaths in between sentences and stay relaxed. Increased success noted in additional trial.   07-29-23: Reported ongoing HEP completion. Endorsed overall improvement of SOVT exercises, although some days are better than others. Targeted SOVTE to reduce vocal tension and improve clarity. Completed sustained exhale, hum, and glides into water with occasional min A to reduce upper body tension and  take reset breaths. Introduced straw talking exercise with short phrases to improve forward resonance. Used negative practice to alternate between back-focused voice and forward resonance to improve awareness of vocal quality. Accurately read aloud a list of 8-9 word sentences using forward resonance with occasional min A. Back-focused voice more apparent at the end of sentences. Benefited from cues to break up long sentences with a pause and reset with a straw or hum.  07-23-23: Entered with clear vocal quality and reported completing SOVT HEP intermittently. Targeted SOVT exercises to reduce tension in vocal folds and improve vocal quality. Aware he can sustain exhale longer than before during exercises (averaged 6 sec for breath only). Completed hum, accents, and happy birthday into water given occasional fading to rare min A to relax upper body tension and increase breath support. Maintained 15 minute conversation with clear vocal quality and minimal upper body tension independently. Pt endorsed increasing awareness of body tension in the community; however, his wife occasionally cues him to relax which causes frustration. SLP counseled pt that the strategies he is learning will take time to master in the community. No coughing or throat clearing observed throughout session.   07-15-23: Endorsed feeling weak over last week, which reflected in low intensity, breathy vocal quality. Instructed diaphragmatic breathing to optimize breath support, which pt able to demo with mod I. Requested repeat education of SOVT exercises d/t lack of completion. Occasional mod A provided to optimize labial seal, breath support, and intentional intensity. Improved vocal quality following warm up exercises. Pt able to demonstrate clear vocal quality with projection for short structured conversation. Demonstrated awareness of reducing intensity and clarity related to fatigue. Recommended prioritizing clear vocal quality for short  durations. Encouraged patient to continue HEP.   07-09-23: Endorsed consuming meds/foods that increased reflux symptoms. Targeted SOVT exercises without water to reduce tension and improve vocal quality. Pt required occasional min A to optimize lip seal around straw. Achieved success with sustained hum, glides, accents, and song with occasional mod cues to reset breath support and send voice through the straw. SLP educated pt on subsystems of voicing to increase pt understanding. Updated HEP to include SOVT with handout provided. Pt reporting subjective improvements in voice/cough and appreciation for SLP team assistance. Affirmed positive progress exhibited thus far.   07-07-23: Pt endorsed improved vocal quality during HEP tasks (see subjective statement). Targeted sustained  phonation to increase vocal quality and intensity with optimal breath support. Pt maintained high 70s dB on sustained /a/ for 4-5 seconds with occasional min A to decrease upper body tension. Noted increase in tension as pt attempted to increase volume. SLP introduced and modeled flow phonation to reduce muscle tension. Pt demonstrated flow phonation structured tasks with occasional mod A to reset his breath and send his voice forward. Noted pt needed three to five abdominal breaths between repetitions to regain adequate breathe support. Rare throat clearing and coughing observed throughout the session with occasional min A to use cough suppressant strategies.   07-02-23: Endorsed baseline breathiness today. Recently received steroid injection/prescription from allergist. Provided rec for increased hydration and use of humidification system d/t complaint of frequent dry throat. Targeted increased vocal intensity and clarity via sustained phonation exercises and pitch glides. Intermittent mod A required to optimize breath support and projection of voice to improve vocal quality. Provided as HEP. In subsequent conversation, pt presented with  variations in vocal quality (hoarse versus clear), with pt able to project for improved clarity with usual min A.   06-25-23: Pt exhibiting increasingly independent carryover of cough suppression techniques, throat clear alternatives, and diaphragmatic breathing. Occasional non-verbal min A required to reduce upper body tension and use abdominal breath at the conversational level. No coughing evidenced throughout the session, infrequent throat clears followed by a sip of water. Vocal quality and intensity appeared to be impacted by general fatigue; however, able to demonstrate clear voice with SLP model.   06-24-23: Endorsed some overall improvements in coughing and voice. Endorsed significant coughing attack s/p exercise. Deduced coughing attack prompted by reduced intentional breath support and type of exercise (plank). Provided instruction and demo of inhale/exhale on exertion to reduce VCD during exercise and pre-medicating with learned breathing technique prior to initiating conversation and physical movement. Pt only exhibited cough x1 today and no throat clearing evidenced. Pt able to suppress cough/throat clear x3 independently. Demonstrates improved vocal quality with focus on diaphragmatic breathing and awareness of upper body tension in structured conversation.   06/18/23: Demonstrating improved awareness and correction of upper body tension. Minimal throat clearing observed throughout the session. Implementing throat clear alternatives with mod I. Targeted reduction of body tension and increased breath support during structured reading task. Able to reduce upper body tension and use abdominal breathing in sentences and short paragraphs with occasional fading min A. Intermittent but mild coughing attacks noted with occasional cues required to suppress. Updated HEP to include structured reading practice.   06/17/23: See subjective statement - cues effective to reduce upper body tension and use  diaphragmatic breathing when heightened stress exhibite. Suggested setting intent with abdominal breaths x3 prior to answering phone. Rare coughing x2, which subsided with mod A to reduce tension and focus on breathing. Targeted conversation today with intermittent min A required to reduce shoulder tension, focus on abdominal breath with hand on abdomen, and project voice forward. Utilize fidget aid to aid relaxation with mild benefit.   06/11/23: Reported positive affirmations from colleagues re: improved voice quality and decreased coughing. Today, occasional min A required to ID and reduce upper body tension, use breathing techniques to suppress cough, and increase vocal intensity in conversation. Provided recommendations to aid awareness and mitigation of cough based on initial symptoms and how to manage breath support during transitions periods.   06/09/23: Reported good carryover of trained techniques to suppress cough/throat clearing with benefit endorsed. Pt able to independently reduce/suppress cough/throat clear with ~  60% accuracy this session. Occasional min A otherwise needed, mostly to relax upper body. Trialed SOVT and resonant exercises, which were limited d/t breath support and cyclical nature of VCD. Targeted "clear" voice with cue to speak out, with benefit achieved at phrase level. Plan to address next session.   06/06/23: Reviewed cyclical nature of VCD/chronic throat clearing or coughing. SLP answered pt's questions regarding role of reflux. Pt able to teach back strategies to avoid chronic throat clearing: relaxing shoulders, neck, chest, sniff and blow, hard swallow, water sips. Addressed use of strategies during conversational speech, as pt reporting can be challenging to engage in work meeting/appointments when occurring. Pt demonstrating x1 coughing episode, lasting ~20 seconds, responsive to SLP cues to use water and hard swallow. X6 throat clears, which was minimal compared to  instances in which pt demonstrated mod-I employment of alternative. Generated strategy to further address stress in regards to pt perceived trigger for increasing upper body tension.   06-04-23: Pt entered with cough which persisted throughout session. Based on reported s/sx and triggers, SLP suspects chronic cough/VCD. Provided education and handout re: VCD and throat clear alternatives. Trained patient in diaphragmatic breathing and sniff-sniff-blow technique to suppress cough, in which pt able to demo with usual fading to occasional min A. Guided patient in self-analysis of upper body tension given usual tension present in chest/neck. Tension resulted in breath hold during inhale/exhale cycle. Breathing pattern improved with use of trained techniques. Trialed SOVTE, which was impacted by impaired breath support. Will incorporate as able.    PATIENT EDUCATION: Education details: see above  Person educated: Patient and Spouse Education method: Medical illustrator Education comprehension: verbalized understanding, returned demonstration, and needs further education  HOME EXERCISE PROGRAM: Abdominal breathing, forward resonance  GOALS: Goals reviewed with patient? Yes  SHORT TERM GOALS: Target date: 06/23/2023  Pt will reduce throat clearing and coughing by 50% at STG date using trained techniques Baseline: > 9, 6 respectively; 1/0 at STG date Goal status: MET  2.  Pt will employ abdominal breathing during structured tasks with 80% accuracy given occasional min A  Baseline:  Goal status: MET  3.  Pt will employ abdominal breathing during structured conversation with 80% accuracy given occasional min A Baseline:  Goal status: MET  4.  Pt will maintain clear vocal quality in 5-10 minute structured conversations x2 given occasional min A  Baseline:  Goal status: MET    LONG TERM GOALS: Target date: 07/21/2023 (09/03/2023 for recert)   Pt will reduce throat clearing and  coughing by 75% at LTG date using trained techniques Baseline:  Goal status: MET  2.  Pt will employ abdominal breathing during unstructured conversation with 80% accuracy given rare min A Baseline:  Goal status: MET  3.  Pt will maintain clear vocal quality in 15+ minute unstructured conversations x2 given rare min A  Baseline: 07-23-23, 08-06-23 Goal status: MET   4.  Pt will carryover trained voice techniques during community outings x2 with less frequent cues required by wife  Baseline:  Goal status: NEW (at recert)  5.  Pt will report improved communication effectiveness via PROM by 2 pts at last ST session  Baseline: CPIB=9 Goal status: IN PROGRESS (at recert)  ASSESSMENT:  CLINICAL IMPRESSION: Patient is a 67 y.o. M who was seen today for dysphonia. PMHX significant for Multiple Myeloma, interstitial lung disease, and GERD. Conducted ongoing education and instruction of relaxed diaphragmatic breathing and vocal techniques, with improving carryover exhibited for discourse and  exertion tasks. Rare throat clearing and coughing observed today. Pt continuing to benefit from skilled ST intervention, such as training vocal hygiene protocol and voice techniques to optimize vocal quality and communication effectiveness.   OBJECTIVE IMPAIRMENTS: include voice disorder. These impairments are limiting patient from effectively communicating at home and in community. Factors affecting potential to achieve goals and functional outcome are co-morbidities. Patient will benefit from skilled SLP services to address above impairments and improve overall function.  REHAB POTENTIAL: Good  PLAN:  SLP FREQUENCY: 2x/week (decrease to 1x/week)  SLP DURATION: 8 weeks (+ 6 weeks for recert)  PLANNED INTERVENTIONS: Language facilitation, Environmental controls, Cueing hierachy, Internal/external aids, Functional tasks, Multimodal communication approach, SLP instruction and feedback, Compensatory  strategies, Patient/family education, and Re-evaluation    Delany Loonam, Student-SLP 08/06/2023, 11:59 AM

## 2023-08-11 NOTE — Therapy (Unsigned)
OUTPATIENT SPEECH LANGUAGE PATHOLOGY VOICE TREATMENT    Patient Name: Samuel [name] MRN: [id number] DOB:[birth year], 67 y.o., male Today's Date: 08/12/2023  PCP: Tally Joe, MD REFERRING PROVIDER: Tally Joe, MD  END OF SESSION:  End of Session - 08/12/23 1020     Visit Number 17    Number of Visits 20    Date for SLP Re-Evaluation 09/03/23    Authorization Type medicare/ BCBS    SLP Start Time 1018    SLP Stop Time  1100    SLP Time Calculation (min) 42 min    Activity Tolerance Patient tolerated treatment well                    Past Medical History:  Diagnosis Date   Asthma    GERD (gastroesophageal reflux disease)    High cholesterol    under control.    History of blood transfusion    Hypothyroidism    Interstitial lung disease (HCC)    Multiple myeloma (HCC)    Perennial allergic rhinitis    Pneumonia    Sciatic pain, right    Seasonal allergic rhinitis    Thyroid disease    Past Surgical History:  Procedure Laterality Date   BRONCHIAL BIOPSY  06/18/2021   Procedure: BRONCHIAL BIOPSIES;  Surgeon: Leslye Peer, MD;  Location: [facility];  Service: Cardiopulmonary;;   BRONCHIAL BIOPSY  08/28/2021   Procedure: BRONCHIAL BIOPSIES;  Surgeon: Josephine Igo, DO;  Location: [facility];  Service: Cardiopulmonary;;   BRONCHIAL WASHINGS  06/18/2021   Procedure: BRONCHIAL WASHINGS;  Surgeon: Leslye Peer, MD;  Location: [facility];  Service: Cardiopulmonary;;   BRONCHIAL WASHINGS  08/28/2021   Procedure: BRONCHIAL WASHINGS;  Surgeon: Josephine Igo, DO;  Location: [facility];  Service: Cardiopulmonary;;   NASAL SINUS SURGERY     NECK SURGERY  1996   VIDEO BRONCHOSCOPY N/A 06/18/2021   Procedure: VIDEO BRONCHOSCOPY WITH FLUORO;  Surgeon: Leslye Peer, MD;  Location: [facility];  Service: Cardiopulmonary;  Laterality: N/A;   VIDEO BRONCHOSCOPY N/A 08/28/2021   Procedure: VIDEO BRONCHOSCOPY WITH FLUORO;  Surgeon: Josephine Igo, DO;   Location: [facility];  Service: Cardiopulmonary;  Laterality: N/A;   Patient Active Problem List   Diagnosis Date Noted   Essential hypertension 07/04/2023   Coronary artery disease 11/28/2022   Mixed hyperlipidemia 11/28/2022   Recurrent sinusitis 11/04/2022   Acute bacterial rhinosinusitis 09/26/2022   Chronic respiratory failure with hypoxia (HCC) 08/01/2022   S/P bronchoscopy    ILD (interstitial lung disease) (HCC)    Preop cardiovascular exam 08/21/2021   Pneumonitis 07/20/2021   Anxiety 07/20/2021   Interstitial pulmonary disease (HCC) 07/20/2021   Abnormal CT of the chest 06/18/2021   Dyspnea 06/15/2021   Hypoxia 06/15/2021   Multiple myeloma (HCC) 03/05/2021   Multiple myeloma not having achieved remission (HCC) 03/05/2021   Bone lesion 02/14/2021   Monoclonal gammopathy 02/14/2021   High cholesterol    Allergic rhinitis 11/23/2020   Mild persistent asthma 11/23/2020   SOB (shortness of breath) 10/04/2011    Onset date: 05/15/23 (referral date)  REFERRING DIAG: R49.0 (ICD-10-CM) - Dysphonia  THERAPY DIAG: Other voice and resonance disorders  Rationale for Evaluation and Treatment: Rehabilitation  SUBJECTIVE:   SUBJECTIVE STATEMENT: "much much better" re: voice Accompanied by: self  PERTINENT HISTORY: "Samuel [name] is a 67 y.o. male who presents as a return patient, for follow-up of sinusitis and globus sensation. At time of last visit on 03/14/2023,  nasal culture was obtained following thick mucoid drainage noted in the right nasal cavity. This was abnormal, and demonstrated gram-positive bacilli. Patient was subsequently treated with a 2-week course of oral antibiotics, and presents today for posttreatment imaging. He has continued to use proton pump inhibitor twice daily, but continues to experience frequent throat clearing and globus sensation. He states he has been working on dietary and lifestyle modifications as previously instructed, and has been partially  successful. He still has difficulty with frequent throat clearing and needing to cough often. He also has not been able to obtain humidification for his oxygen, as the DME company stated they needed a prescription.  Last visit 03/14/2023: Samuel [name] is a 67 y.o. male who presents as a new consult, referred by [name], FNP , for evaluation and treatment of sinus issues, hoarseness, and throat symptoms. He is accompanied by his wife.  Patient had history of multiple myeloma, status post treatment with chemotherapy. He developed interstitial lung disease following chemotherapeutic treatment, and is currently followed closely by pulmonology. He also endorses history of significant environmental allergies, and reports that recent allergy testing showed positivity to "everything". He was previously on immunotherapy, but this was held during his cancer treatment. He is considering starting back on the chemotherapy due to the significance of his symptoms. He is currently using Singulair, antihistamine and Patanase nasal spray on a daily basis. He also reports using nasal saline irrigations on a daily basis. He reports history of deviated nasal septum repair in 1983. He denies history of sinusitis requiring treatment with oral antibiotics in the last 12 months. His primary symptoms are nasal congestion and postnasal drainage. He denies facial pressure, pain, hyposmia, hypogeusia. He has not had any imaging of his head or sinuses. He does occasionally experience headaches. He uses oxygen on a nightly basis, and states that his oxygen is currently not humidified.  He reports symptoms of silent reflux, and states he is currently taking omeprazole on a daily basis. [name] is frequent throat clearing and globus sensation. He also reports occasional hoarseness."  PAIN: Are you having pain? No  FALLS: Has patient fallen in last 6 months? No  LIVING ENVIRONMENT: Lives with: lives with their  family Lives in: House/apartment  PLOF:Level of assistance: Independent with ADLs, Independent with IADLs Employment: Full-time employment  PATIENT GOALS: "control my breath, control my speech, and relaxation"   OBJECTIVE:   PATIENT REPORTED OUTCOME MEASURES (PROM): Communication Participation Item Bank: 9 "quite a bit" for all situations  but "very much" for getting turn in fast moving conversation  TODAY'S TREATMENT:                                                                                                                                         08-12-23: Reports ongoing improvements in voice as well as breathing with less frequent cues provided by wife to use strategies.  Continue to target use of diaphragmatic breathing, intentional upper body relaxation, and forward projection of voice in dual tasking scenarios. Pt aware of increased tension and reduced abdominal breathing during transition periods that include dividing attention between talking and other task (walking, answering phone). Focused on being intentional with relaxation and breath support to improve vocal quality and intensity during targeted dual tasking activities. Occasional min cues required to reduce tension, optimize breathing pattern, and project voice during these tasks.   08-06-23: Entered with hoarse vocal quality that improved independently, with pt stating that he focused on using forward resonance. Maintained relaxed appearance, strong breath support, and clear vocal quality during 20-minute unstructured conversation. SLP directed pt to walk down the hall and return to therapy room to read a short passage x2 to target increased breath support and clear vocal quality in speech following exertion. Successfully completed task with occasional fading to rare min A, with improved breath support and vocal quality noted in the 2nd attempt. Benefited from cues to reset before beginning to read and take intentional pauses  between sentences. Pt self-rated first attempt a 7.5 and second attempt an 8.5-9 out of 10. Addressed breath support during exertion by completing walking and talking task. Pt maintained structured conversation while completing laps around therapy gym with occasional mod fading to min A to take breaths in between sentences and stay relaxed. Increased success noted in additional trial.   07-29-23: Reported ongoing HEP completion. Endorsed overall improvement of SOVT exercises, although some days are better than others. Targeted SOVTE to reduce vocal tension and improve clarity. Completed sustained exhale, hum, and glides into water with occasional min A to reduce upper body tension and take reset breaths. Introduced straw talking exercise with short phrases to improve forward resonance. Used negative practice to alternate between back-focused voice and forward resonance to improve awareness of vocal quality. Accurately read aloud a list of 8-9 word sentences using forward resonance with occasional min A. Back-focused voice more apparent at the end of sentences. Benefited from cues to break up long sentences with a pause and reset with a straw or hum.  07-23-23: Entered with clear vocal quality and reported completing SOVT HEP intermittently. Targeted SOVT exercises to reduce tension in vocal folds and improve vocal quality. Aware he can sustain exhale longer than before during exercises (averaged 6 sec for breath only). Completed hum, accents, and happy birthday into water given occasional fading to rare min A to relax upper body tension and increase breath support. Maintained 15 minute conversation with clear vocal quality and minimal upper body tension independently. Pt endorsed increasing awareness of body tension in the community; however, his wife occasionally cues him to relax which causes frustration. SLP counseled pt that the strategies he is learning will take time to master in the community. No coughing  or throat clearing observed throughout session.   07-15-23: Endorsed feeling weak over last week, which reflected in low intensity, breathy vocal quality. Instructed diaphragmatic breathing to optimize breath support, which pt able to demo with mod I. Requested repeat education of SOVT exercises d/t lack of completion. Occasional mod A provided to optimize labial seal, breath support, and intentional intensity. Improved vocal quality following warm up exercises. Pt able to demonstrate clear vocal quality with projection for short structured conversation. Demonstrated awareness of reducing intensity and clarity related to fatigue. Recommended prioritizing clear vocal quality for short durations. Encouraged patient to continue HEP.   07-09-23: Endorsed consuming meds/foods that increased reflux symptoms. Targeted SOVT  exercises without water to reduce tension and improve vocal quality. Pt required occasional min A to optimize lip seal around straw. Achieved success with sustained hum, glides, accents, and song with occasional mod cues to reset breath support and send voice through the straw. SLP educated pt on subsystems of voicing to increase pt understanding. Updated HEP to include SOVT with handout provided. Pt reporting subjective improvements in voice/cough and appreciation for SLP team assistance. Affirmed positive progress exhibited thus far.   07-07-23: Pt endorsed improved vocal quality during HEP tasks (see subjective statement). Targeted sustained phonation to increase vocal quality and intensity with optimal breath support. Pt maintained high 70s dB on sustained /a/ for 4-5 seconds with occasional min A to decrease upper body tension. Noted increase in tension as pt attempted to increase volume. SLP introduced and modeled flow phonation to reduce muscle tension. Pt demonstrated flow phonation structured tasks with occasional mod A to reset his breath and send his voice forward. Noted pt needed three to  five abdominal breaths between repetitions to regain adequate breathe support. Rare throat clearing and coughing observed throughout the session with occasional min A to use cough suppressant strategies.   07-02-23: Endorsed baseline breathiness today. Recently received steroid injection/prescription from allergist. Provided rec for increased hydration and use of humidification system d/t complaint of frequent dry throat. Targeted increased vocal intensity and clarity via sustained phonation exercises and pitch glides. Intermittent mod A required to optimize breath support and projection of voice to improve vocal quality. Provided as HEP. In subsequent conversation, pt presented with variations in vocal quality (hoarse versus clear), with pt able to project for improved clarity with usual min A.   06-25-23: Pt exhibiting increasingly independent carryover of cough suppression techniques, throat clear alternatives, and diaphragmatic breathing. Occasional non-verbal min A required to reduce upper body tension and use abdominal breath at the conversational level. No coughing evidenced throughout the session, infrequent throat clears followed by a sip of water. Vocal quality and intensity appeared to be impacted by general fatigue; however, able to demonstrate clear voice with SLP model.   06-24-23: Endorsed some overall improvements in coughing and voice. Endorsed significant coughing attack s/p exercise. Deduced coughing attack prompted by reduced intentional breath support and type of exercise (plank). Provided instruction and demo of inhale/exhale on exertion to reduce VCD during exercise and pre-medicating with learned breathing technique prior to initiating conversation and physical movement. Pt only exhibited cough x1 today and no throat clearing evidenced. Pt able to suppress cough/throat clear x3 independently. Demonstrates improved vocal quality with focus on diaphragmatic breathing and awareness of upper  body tension in structured conversation.   06/18/23: Demonstrating improved awareness and correction of upper body tension. Minimal throat clearing observed throughout the session. Implementing throat clear alternatives with mod I. Targeted reduction of body tension and increased breath support during structured reading task. Able to reduce upper body tension and use abdominal breathing in sentences and short paragraphs with occasional fading min A. Intermittent but mild coughing attacks noted with occasional cues required to suppress. Updated HEP to include structured reading practice.   06/17/23: See subjective statement - cues effective to reduce upper body tension and use diaphragmatic breathing when heightened stress exhibite. Suggested setting intent with abdominal breaths x3 prior to answering phone. Rare coughing x2, which subsided with mod A to reduce tension and focus on breathing. Targeted conversation today with intermittent min A required to reduce shoulder tension, focus on abdominal breath with hand on abdomen, and  project voice forward. Utilize fidget aid to aid relaxation with mild benefit.   06/11/23: Reported positive affirmations from colleagues re: improved voice quality and decreased coughing. Today, occasional min A required to ID and reduce upper body tension, use breathing techniques to suppress cough, and increase vocal intensity in conversation. Provided recommendations to aid awareness and mitigation of cough based on initial symptoms and how to manage breath support during transitions periods.   06/09/23: Reported good carryover of trained techniques to suppress cough/throat clearing with benefit endorsed. Pt able to independently reduce/suppress cough/throat clear with ~60% accuracy this session. Occasional min A otherwise needed, mostly to relax upper body. Trialed SOVT and resonant exercises, which were limited d/t breath support and cyclical nature of VCD. Targeted "clear" voice  with cue to speak out, with benefit achieved at phrase level. Plan to address next session.   06/06/23: Reviewed cyclical nature of VCD/chronic throat clearing or coughing. SLP answered pt's questions regarding role of reflux. Pt able to teach back strategies to avoid chronic throat clearing: relaxing shoulders, neck, chest, sniff and blow, hard swallow, water sips. Addressed use of strategies during conversational speech, as pt reporting can be challenging to engage in work meeting/appointments when occurring. Pt demonstrating x1 coughing episode, lasting ~20 seconds, responsive to SLP cues to use water and hard swallow. X6 throat clears, which was minimal compared to instances in which pt demonstrated mod-I employment of alternative. Generated strategy to further address stress in regards to pt perceived trigger for increasing upper body tension.   06-04-23: Pt entered with cough which persisted throughout session. Based on reported s/sx and triggers, SLP suspects chronic cough/VCD. Provided education and handout re: VCD and throat clear alternatives. Trained patient in diaphragmatic breathing and sniff-sniff-blow technique to suppress cough, in which pt able to demo with usual fading to occasional min A. Guided patient in self-analysis of upper body tension given usual tension present in chest/neck. Tension resulted in breath hold during inhale/exhale cycle. Breathing pattern improved with use of trained techniques. Trialed SOVTE, which was impacted by impaired breath support. Will incorporate as able.    PATIENT EDUCATION: Education details: see above  Person educated: Patient and Spouse Education method: Medical illustrator Education comprehension: verbalized understanding, returned demonstration, and needs further education  HOME EXERCISE PROGRAM: Abdominal breathing, forward resonance  GOALS: Goals reviewed with patient? Yes  SHORT TERM GOALS: Target date: 06/23/2023  Pt will reduce  throat clearing and coughing by 50% at STG date using trained techniques Baseline: > 9, 6 respectively; 1/0 at STG date Goal status: MET  2.  Pt will employ abdominal breathing during structured tasks with 80% accuracy given occasional min A  Baseline:  Goal status: MET  3.  Pt will employ abdominal breathing during structured conversation with 80% accuracy given occasional min A Baseline:  Goal status: MET  4.  Pt will maintain clear vocal quality in 5-10 minute structured conversations x2 given occasional min A  Baseline:  Goal status: MET    LONG TERM GOALS: Target date: 07/21/2023 (09/03/2023 for recert)   Pt will reduce throat clearing and coughing by 75% at LTG date using trained techniques Baseline:  Goal status: MET  2.  Pt will employ abdominal breathing during unstructured conversation with 80% accuracy given rare min A Baseline:  Goal status: MET  3.  Pt will maintain clear vocal quality in 15+ minute unstructured conversations x2 given rare min A  Baseline: 07-23-23, 08-06-23 Goal status: MET   4.  Pt  will carryover trained voice techniques during community outings x2 with less frequent cues required by wife  Baseline: 08-12-23,  Goal status: IN PROGRESS  5.  Pt will report improved communication effectiveness via PROM by 2 pts at last ST session  Baseline: CPIB=9 Goal status: IN PROGRESS (at recert)  ASSESSMENT:  CLINICAL IMPRESSION: Patient is a 67 y.o. M who was seen today for dysphonia. PMHX significant for Multiple Myeloma, interstitial lung disease, and GERD. Conducted ongoing education and instruction of relaxed diaphragmatic breathing and vocal techniques, with improving carryover exhibited for discourse and exertion tasks. Rare throat clearing and coughing observed today. Pt continuing to benefit from skilled ST intervention, such as training vocal hygiene protocol and voice techniques to optimize vocal quality and communication effectiveness.    OBJECTIVE IMPAIRMENTS: include voice disorder. These impairments are limiting patient from effectively communicating at home and in community. Factors affecting potential to achieve goals and functional outcome are co-morbidities. Patient will benefit from skilled SLP services to address above impairments and improve overall function.  REHAB POTENTIAL: Good  PLAN:  SLP FREQUENCY: 2x/week (decrease to 1x/week)  SLP DURATION: 8 weeks (+ 6 weeks for recert)  PLANNED INTERVENTIONS: Language facilitation, Environmental controls, Cueing hierachy, Internal/external aids, Functional tasks, Multimodal communication approach, SLP instruction and feedback, Compensatory strategies, Patient/family education, and Re-evaluation    Gracy Racer, CCC-SLP 08/12/2023, 11:29 AM

## 2023-08-12 ENCOUNTER — Ambulatory Visit: Payer: Medicare Other

## 2023-08-12 DIAGNOSIS — R498 Other voice and resonance disorders: Secondary | ICD-10-CM

## 2023-08-12 NOTE — Patient Instructions (Signed)
Walking and talking is multi-tasking. Establish your breathing pattern first. Stay relaxed.   Tasks to trial:  Moving ball back and forth between hands Saying words in order in alphabet  Name items you see in certain colors (ex: everything that is red)

## 2023-08-13 ENCOUNTER — Other Ambulatory Visit: Payer: Self-pay | Admitting: Physician Assistant

## 2023-08-13 ENCOUNTER — Inpatient Hospital Stay: Payer: Medicare Other | Attending: Hematology and Oncology

## 2023-08-13 ENCOUNTER — Inpatient Hospital Stay (HOSPITAL_BASED_OUTPATIENT_CLINIC_OR_DEPARTMENT_OTHER): Payer: Medicare Other | Admitting: Physician Assistant

## 2023-08-13 ENCOUNTER — Other Ambulatory Visit: Payer: Self-pay

## 2023-08-13 VITALS — BP 130/87 | HR 57 | Temp 97.9°F | Resp 18 | Ht 70.0 in | Wt 202.0 lb

## 2023-08-13 DIAGNOSIS — Z7961 Long term (current) use of immunomodulator: Secondary | ICD-10-CM | POA: Insufficient documentation

## 2023-08-13 DIAGNOSIS — C9000 Multiple myeloma not having achieved remission: Secondary | ICD-10-CM

## 2023-08-13 DIAGNOSIS — Z79624 Long term (current) use of inhibitors of nucleotide synthesis: Secondary | ICD-10-CM | POA: Insufficient documentation

## 2023-08-13 DIAGNOSIS — Z7982 Long term (current) use of aspirin: Secondary | ICD-10-CM | POA: Diagnosis not present

## 2023-08-13 DIAGNOSIS — Z87891 Personal history of nicotine dependence: Secondary | ICD-10-CM | POA: Diagnosis not present

## 2023-08-13 DIAGNOSIS — Z79899 Other long term (current) drug therapy: Secondary | ICD-10-CM | POA: Diagnosis not present

## 2023-08-13 DIAGNOSIS — Z7969 Long term (current) use of other immunomodulators and immunosuppressants: Secondary | ICD-10-CM | POA: Diagnosis not present

## 2023-08-13 LAB — CBC WITH DIFFERENTIAL (CANCER CENTER ONLY)
Abs Immature Granulocytes: 0.02 10*3/uL (ref 0.00–0.07)
Basophils Absolute: 0 10*3/uL (ref 0.0–0.1)
Basophils Relative: 0 %
Eosinophils Absolute: 0.1 10*3/uL (ref 0.0–0.5)
Eosinophils Relative: 2 %
HCT: 43.8 % (ref 39.0–52.0)
Hemoglobin: 14.7 g/dL (ref 13.0–17.0)
Immature Granulocytes: 0 %
Lymphocytes Relative: 17 %
Lymphs Abs: 1.2 10*3/uL (ref 0.7–4.0)
MCH: 30.1 pg (ref 26.0–34.0)
MCHC: 33.6 g/dL (ref 30.0–36.0)
MCV: 89.8 fL (ref 80.0–100.0)
Monocytes Absolute: 0.4 10*3/uL (ref 0.1–1.0)
Monocytes Relative: 6 %
Neutro Abs: 5.2 10*3/uL (ref 1.7–7.7)
Neutrophils Relative %: 75 %
Platelet Count: 222 10*3/uL (ref 150–400)
RBC: 4.88 MIL/uL (ref 4.22–5.81)
RDW: 13.5 % (ref 11.5–15.5)
WBC Count: 7 10*3/uL (ref 4.0–10.5)
nRBC: 0 % (ref 0.0–0.2)

## 2023-08-13 LAB — CMP (CANCER CENTER ONLY)
ALT: 33 U/L (ref 0–44)
AST: 34 U/L (ref 15–41)
Albumin: 4.2 g/dL (ref 3.5–5.0)
Alkaline Phosphatase: 70 U/L (ref 38–126)
Anion gap: 5 (ref 5–15)
BUN: 16 mg/dL (ref 8–23)
CO2: 29 mmol/L (ref 22–32)
Calcium: 9.7 mg/dL (ref 8.9–10.3)
Chloride: 97 mmol/L — ABNORMAL LOW (ref 98–111)
Creatinine: 0.93 mg/dL (ref 0.61–1.24)
GFR, Estimated: >60 mL/min (ref >60)
Glucose, Bld: 99 mg/dL (ref 70–99)
Potassium: 3.9 mmol/L (ref 3.5–5.1)
Sodium: 131 mmol/L — ABNORMAL LOW (ref 135–145)
Total Bilirubin: 0.5 mg/dL (ref 0.3–1.2)
Total Protein: 8.2 g/dL — ABNORMAL HIGH (ref 6.5–8.1)

## 2023-08-13 LAB — LACTATE DEHYDROGENASE: LDH: 279 U/L — ABNORMAL HIGH (ref 98–192)

## 2023-08-13 NOTE — Progress Notes (Signed)
[facility] [phone]   [phone]  ONCOLOGY PROGRESS NOTE  Patient Care Team: Tally Joe, MD as PCP - General (Family Medicine)  Hematological/Oncological History # IgG Kappa Multiple Myeloma 02/02/2021:  Presented to [facility] due to right sided flank tenderness with bruising. CT abdomen/pelvis: Multiple small lytic lesions in the thoracolumbar spine and bilateral pelvis -SPEP: IgG 2,082 (H), M Protein 1.8 (H). IFE shows IgG monoclonal protein with kappa light chain specificity.  -LDH 169, CBC normal, CMP normal except for sodium 131 (L), Chloride 96 (L).   02/14/2021: Establish care with Samuel Kaufmann PA-C 02/22/2021: bone marrow biopsy confirms the diagnosis of Multiple Myeloma with a monoclonal plasma cell population.  03/16/2021: Cycle 1 Day 1 of VRd chemotherapy  04/06/2021: Cycle 2 Day 1 of VRd chemotherapy  04/27/2021: Cycle 3 Day 1 of VRd chemotherapy  05/18/2021: Cycle 4 Day 1 of VRd chemotherapy  06/01/2021: drop dexamethasone to 20mg  PO weekly and start lasix due to shortness of breath.  06/15/2021: Desaturation to 87% on ambulation. HELD velcade today and sent to ED for evaluation.  06/22/2021: Findings consistent with drug reaction the lungs with eosinophils on BAL.  Given these findings we will definitely hold [name] and plan to avoid pomalidomide 06/29/2021: Cycle 1 Day 1 CyBorD chemotherapy 07/05/2021: HOLD chemotherapy given worsening lung function  Interval History:  Samuel [name] 67 y.o. male with medical history significant for IgG Kappa multiple myeloma who presents for a follow up visit. The patient's last visit was on 06/16/2023. He is accompanied by his wife for this visit.  Mr. Fudala reports in the interim since her last visit, he denies any changes to his health. He reports his shortness of breath is more pronounced recently and mainly with exertion. He continues to exercise with supplemental oxygen to try to improve his lung  capacity. He does have some fatigue towards the end of the day but continues to work full time. He denies nausea, vomiting or bowel habit changes. He denies easy bruising or signs of bleeding. He deneis any new bone or back pain.  He denies fevers, chills, night sweats, chest pain or cough. He has no other complaints. Rest of the 10 point ROS is listed below.   MEDICAL HISTORY:  Past Medical History:  Diagnosis Date   Asthma    GERD (gastroesophageal reflux disease)    High cholesterol    under control.    History of blood transfusion    Hypothyroidism    Interstitial lung disease (HCC)    Multiple myeloma (HCC)    Perennial allergic rhinitis    Pneumonia    Sciatic pain, right    Seasonal allergic rhinitis    Thyroid disease     SURGICAL HISTORY: Past Surgical History:  Procedure Laterality Date   BRONCHIAL BIOPSY  06/18/2021   Procedure: BRONCHIAL BIOPSIES;  Surgeon: Leslye Peer, MD;  Location: [facility];  Service: Cardiopulmonary;;   BRONCHIAL BIOPSY  08/28/2021   Procedure: BRONCHIAL BIOPSIES;  Surgeon: Josephine Igo, DO;  Location: [facility];  Service: Cardiopulmonary;;   BRONCHIAL WASHINGS  06/18/2021   Procedure: BRONCHIAL WASHINGS;  Surgeon: Leslye Peer, MD;  Location: [facility];  Service: Cardiopulmonary;;   BRONCHIAL WASHINGS  08/28/2021   Procedure: BRONCHIAL WASHINGS;  Surgeon: Josephine Igo, DO;  Location: [facility];  Service: Cardiopulmonary;;   NASAL SINUS SURGERY     NECK SURGERY  1996   VIDEO BRONCHOSCOPY N/A 06/18/2021   Procedure: VIDEO BRONCHOSCOPY WITH  FLUORO;  Surgeon: Leslye Peer, MD;  Location: [facility];  Service: Cardiopulmonary;  Laterality: N/A;   VIDEO BRONCHOSCOPY N/A 08/28/2021   Procedure: VIDEO BRONCHOSCOPY WITH FLUORO;  Surgeon: Josephine Igo, DO;  Location: [facility];  Service: Cardiopulmonary;  Laterality: N/A;    SOCIAL HISTORY: Social History   Socioeconomic History   Marital status: Married    Spouse  name: Not on file   Number of children: 3   Years of education: Not on file   Highest education level: Not on file  Occupational History   Not on file  Tobacco Use   Smoking status: Former    Current packs/day: 0.00    Average packs/day: 0.1 packs/day for 3.0 years (0.3 ttl pk-yrs)    Types: Cigarettes    Start date: 10/22/1979    Quit date: 10/21/1982    Years since quitting: 40.8   Smokeless tobacco: Never  Vaping Use   Vaping status: Never Used  Substance and Sexual Activity   Alcohol use: Not Currently    Comment: 1 drink daily   Drug use: No   Sexual activity: Not on file  Other Topics Concern   Not on file  Social History Narrative   Not on file   Social Determinants of Health   Financial Resource Strain: Low Risk  (03/15/2022)   Received from [facility], [facility]   Overall Financial Resource Strain (CARDIA)    Difficulty of Paying Living Expenses: Not hard at all  Food Insecurity: Low Risk  (04/04/2023)   [facility]   Hunger Vital Sign    Worried About Running Out of Food in the Last Year: Never true    Ran Out of Food in the Last Year: Never true  Transportation Needs: No Transportation Needs (03/15/2022)   [facility], [facility]   [facility] - Transportation    Lack of Transportation (Medical): No    Lack of Transportation (Non-Medical): No  Physical Activity: Insufficiently Active (01/10/2022)   Received from [facility], [facility]   Exercise Vital Sign    Days of Exercise per Week: 3 days    Minutes of Exercise per Session: 30 min  Stress: No Stress Concern Present (01/10/2022)   Received from [facility], [facility] - Occupational Stress Questionnaire    Feeling of Stress : Only a little  Social Connections: Socially Integrated (01/10/2022)   Received from [facility], [facility]   Social Connection and Isolation Panel [NHANES]    Frequency of Communication with  Friends and Family: More than three times a week    Frequency of Social Gatherings with Friends and Family: Twice a week    Attends Religious Services: 1 to 4 times per year    Active Member of [employer] or Organizations: Yes    Attends Engineer, structural: More than 4 times per year    Marital Status: Married  Catering manager Violence: Not At Risk (03/15/2022)   Received from [facility], [facility]   Humiliation, Afraid, Rape, and Kick questionnaire    Fear of Current or Ex-Partner: No    Emotionally Abused: No    Physically Abused: No    Sexually Abused: No    FAMILY HISTORY: Family History  Problem Relation Age of Onset   Hypertension Mother    Allergies Mother    Allergies Father    CAD Father 5   Breast cancer Paternal Grandmother  Hypertension Other    Heart disease Other     ALLERGIES:  is allergic to clarithromycin, ofev [nintedanib], and penicillins.  MEDICATIONS:  Current Outpatient Medications  Medication Sig Dispense Refill   acyclovir (ZOVIRAX) 400 MG tablet Take 1 tablet (400 mg total) by mouth 2 (two) times daily. 60 tablet 3   albuterol (PROVENTIL) (2.5 MG/3ML) 0.083% nebulizer solution Take 3 mLs (2.5 mg total) by nebulization every 6 (six) hours as needed for wheezing or shortness of breath. 120 mL 12   albuterol (VENTOLIN HFA) 108 (90 Base) MCG/ACT inhaler Inhale 2 puffs into the lungs every 6 hours as needed for wheezing or shortness of breath. 8.5 g 1   ALPRAZolam (XANAX) 0.5 MG tablet Take 0.5 mg by mouth at bedtime.     aspirin EC 81 MG tablet Take 1 tablet (81 mg total) by mouth daily. Swallow whole. 90 tablet 3   ASSESS FULL RANGE PEAK METER DEVI as directed.     azelastine (ASTELIN) 0.1 % nasal spray Place 2 sprays into both nostrils 2 (two) times daily. Use in each nostril as directed 30 mL 5   bisoprolol (ZEBETA) 5 MG tablet Take 1 tablet (5 mg total) by mouth daily. 30 tablet 2   Budeson-Glycopyrrol-Formoterol (BREZTRI AEROSPHERE)  160-9-4.8 MCG/ACT AERO Inhale 2 puffs into the lungs in the morning and at bedtime. 10.7 g 5   cholecalciferol (VITAMIN D) 1000 UNITS tablet Take 3,000 Units by mouth daily.     Dextromethorphan HBr (DELSYM PO) Take by mouth as needed. For cough     dextromethorphan-guaiFENesin (MUCINEX DM) 30-600 MG 12hr tablet Take 1 tablet by mouth 2 (two) times daily.     EPINEPHrine (EPI-PEN) 0.3 mg/0.3 mL DEVI Inject 0.3 mg into the muscle as needed.     ezetimibe-simvastatin (VYTORIN) 10-40 MG tablet Take 1 tablet by mouth daily. 90 tablet 3   fluticasone (FLONASE) 50 MCG/ACT nasal spray 1 spray.     Guaifenesin 1200 MG TB12 1 tablet as needed Orally every 12 hrs for 5 days     hydrOXYzine (ATARAX) 25 MG tablet Take 25 mg by mouth every 8 (eight) hours as needed.     ketoconazole (NIZORAL) 2 % cream SMARTSIG:1 Application Topical 1 to 2 Times Daily     levothyroxine (SYNTHROID) 50 MCG tablet Take 50 mcg by mouth daily before breakfast.     loratadine (CLARITIN) 10 MG tablet Take 10 mg by mouth daily.     montelukast (SINGULAIR) 10 MG tablet Take 10 mg by mouth at bedtime.     Multiple Vitamin (MULTIVITAMIN) tablet Take 1 tablet by mouth daily.     omeprazole (PRILOSEC) 40 MG capsule Take 1 capsule (40 mg total) by mouth 2 (two) times daily. Take 30 minutes before a meal 60 capsule 3   OXYGEN Inhale 2 L into the lungs continuous. As needed     sildenafil (REVATIO) 20 MG tablet Take 20 mg by mouth daily as needed (ED).     Simethicone (SIMETHICONE ULTRA STRENGTH) 180 MG CAPS Take 1 capsule (180 mg total) by mouth 3 (three) times daily as needed. 90 capsule 0   sodium chloride (OCEAN) 0.65 % SOLN nasal spray Place 1 spray into both nostrils as needed for congestion.     tamsulosin (FLOMAX) 0.4 MG CAPS capsule Take 0.4 mg by mouth daily.     thyroid (ARMOUR) 120 MG tablet Take 120 mg by mouth daily before breakfast.     triamcinolone cream (KENALOG) 0.1 % Apply  1 application topically daily as needed (sun  burn itch).     Turmeric 400 MG CAPS 1 capsule Orally once a day     No current facility-administered medications for this visit.    REVIEW OF SYSTEMS:   Constitutional: ( - ) fevers, ( - )  chills , ( - ) night sweats Eyes: ( - ) blurriness of vision, ( - ) double vision, ( - ) watery eyes Ears, nose, mouth, throat, and face: ( - ) mucositis, ( - ) sore throat Respiratory: ( - ) cough, ( + ) dyspnea, ( - ) wheezes Cardiovascular: ( - ) palpitation, ( - ) chest discomfort, ( - ) lower extremity swelling Gastrointestinal:  ( - ) nausea, ( - ) heartburn, ( - ) change in bowel habits Skin: ( - ) abnormal skin rashes Lymphatics: ( - ) new lymphadenopathy, ( - ) easy bruising Neurological: ( - ) numbness, ( - ) tingling, ( - ) new weaknesses Behavioral/Psych: ( - ) mood change, ( - ) new changes  All other systems were reviewed with the patient and are negative.  PHYSICAL EXAMINATION: ECOG PERFORMANCE STATUS: 1 - Symptomatic but completely ambulatory  GENERAL: well appearing middle-aged Caucasian male in NAD  SKIN: skin color, texture, turgor are normal, no rashes or significant lesions EYES: conjunctiva are pink and non-injected, sclera clear LUNGS: clear to auscultation and percussion with normal breathing effort HEART: regular rate & rhythm and no murmurs and no lower extremity edema Musculoskeletal: no cyanosis of digits and no clubbing  PSYCH: alert & oriented x 3, fluent speech NEURO: no focal motor/sensory deficits   LABORATORY DATA:  I have reviewed the data as listed    Latest Ref Rng & Units 08/13/2023    8:21 AM 07/16/2023    8:02 AM 06/16/2023    9:10 AM  CBC  WBC 4.0 - 10.5 K/uL 7.0  11.0  6.2   Hemoglobin 13.0 - 17.0 g/dL 23.5  57.3  22.0   Hematocrit 39.0 - 52.0 % 43.8  45.3  43.1   Platelets 150 - 400 K/uL 222  217  230        Latest Ref Rng & Units 07/16/2023    8:02 AM 06/16/2023    9:10 AM 05/19/2023    9:20 AM  CMP  Glucose 70 - 99 mg/dL 90  75  80   BUN  8 - 23 mg/dL 19  15  14    Creatinine 0.61 - 1.24 mg/dL 2.54  2.70  6.23   Sodium 135 - 145 mmol/L 128  132  128   Potassium 3.5 - 5.1 mmol/L 4.2  4.2  4.3   Chloride 98 - 111 mmol/L 94  97  94   CO2 22 - 32 mmol/L 29  29  29    Calcium 8.9 - 10.3 mg/dL 9.2  9.3  9.7   Total Protein 6.5 - 8.1 g/dL 8.1  8.1  8.1   Total Bilirubin 0.3 - 1.2 mg/dL 0.5  0.3  0.2   Alkaline Phos 38 - 126 U/L 70  68  79   AST 15 - 41 U/L 26  32  29   ALT 0 - 44 U/L 28  26  21      Lab Results  Component Value Date   MPROTEIN 0.9 (H) 07/16/2023   MPROTEIN 0.8 (H) 06/16/2023   MPROTEIN 0.8 (H) 05/19/2023   Lab Results  Component Value Date   KPAFRELGTCHN 30.5 (H) 07/16/2023  KPAFRELGTCHN 35.2 (H) 06/16/2023   KPAFRELGTCHN 35.8 (H) 05/19/2023   LAMBDASER 15.4 07/16/2023   LAMBDASER 16.4 06/16/2023   LAMBDASER 19.1 05/19/2023   KAPLAMBRATIO 1.98 (H) 07/16/2023   KAPLAMBRATIO 2.15 (H) 06/16/2023   KAPLAMBRATIO 1.87 (H) 05/19/2023    RADIOGRAPHIC STUDIES: I have personally reviewed the radiological images as listed and agreed with the findings in the report: lytic lesions in the hip bones bilaterally.  No results found.   ASSESSMENT & PLAN NASSER BLATZ is a 67 y.o. male with medical history significant for IgG Kappa multiple myeloma who presents for a follow up visit.   After review of the labs, review of the records, and discussion with the patient the findings are most consistent with an IgG kappa multiple myeloma.  The patient has 2 lytic lesions within the pelvic bones (more noted in spine on CT scan) as well as 10% plasma cells within the bone marrow biopsy.  This combined with his serological findings confirm the diagnosis of multiple myeloma.  The initial treatment of choice for this patient's multiple myeloma was VRd. This will consist of bortezomib 1.5mg /m2 on days 1, 8, 15, dexamethasone 40mg  on days 1,8, and 15, and revlimid 25mg  PO daily days 1-14. Cycles will consist of 21 days. We will  use this regimen to stabilize the patient's myeloma, then make a referral to a BMT center of his choosing for consideration of a bone marrow transplant once he has been noted to have a good response (VGPR or better). Zometa was started after dental clearance was obtained. This will be continued x 2 years.   Unfortunately had a drug reaction to [name] and was admitted to the hospital from 06/15/2021 until 06/18/2021.  The patient underwent a B AL which clearly showed evidence of eosinophils.  This is consistent with a drug reaction most likely caused by the patient's [name].  Discussed the case with pulmonology who recommended that we not rechallenge for attempt other drugs in the same class such as pomalidomide.  Given the patient's excellent response to [facility] therapy might preference would be to continue that.  As such I would recommend we proceed with CyBorD chemotherapy.  Daratumumab-based therapy could have been considered, but is often paired with an immunologic.  Additionally I would like to preserve those for additional lines of therapy if necessary.  Therefore we will proceed with CyBorD chemotherapy have the patient referred to transplant.  R-IPSS score: Stage 2. PFS of 42 months  # IgG Kappa Multiple Myeloma (t11;14, standard risk)  --diagnostic criteria was met with monoclonal plasma cells in the bone marrow and lytic lesions on the bone --recommend proceeding with VRd chemotherapy as noted above --patient is young and healthy enough for consideration of BMT, though he is borderline with his age. Will consider referral pending response to treatment.  -- Cycle 1 Day 1 started on 03/16/2021 --decreased dexamethasone to 20mg  PO weekly due to fluid overload. Also started lasix 20mg  PO (changed on 06/01/2021) --plan to HOLD revlimid moving forward after evidence of drug reaction in the lungs. --started CyBorD chemotherapy on 06/29/2021 but held starting 07/05/2021 given worsening lung  function --patient has reached a VGPR.  Plan: --Labs from today were reviewed with patient. CBC is unremarkable without any cytopenias. Creatinine and calcium levels are normal. Myeloma panel pending today --M protein is trending up, with last myeloma panel from 07/16/2023 measuring M protein at 0.9 g/dL. --Discussed Dr. Derek Mound recommendation to resume treatment since M protein has increased above 0.5  g/dL. --[facility] recommended Dara/[name]/dexamethasone which we agree is a reasonable regimen. Patient was aware of the risks and benefits regarding possible worsening of the lung function. --Discussed the risk of waiting to resume treatment including end organ damage including cytopenias, renal dysfunction and pathologic fractures.  --Patient would like to make his final decision based on the myeloma labs drawn today.  --RTC in 12 weeks with interval 4 week with labs    #Drug Reaction in the Lung --confirmed with increased eosinophils on BAL during admission for shortness of breath --on pirfenidone and steroid taper managed by pulmonologist, Dr. Marchelle Gearing.  #Supportive Care -- chemotherapy education complete --zometa therapy started on 04/05/2021 (4 mg IV q 3 months), treatment on hold per patient's request. --ASA 81mg  PO daily for thromboprophylaxis on revlimid -- zofran 8mg  q8H PRN and compazine 10mg  PO q6H for nausea -- acyclovir 400mg  PO BID for VCZ prophylaxis -- no pain medication required at this time.   No orders of the defined types were placed in this encounter.  All questions were answered. The patient knows to call the clinic with any problems, questions or concerns.  I have spent a total of 30 minutes minutes of face-to-face and non-face-to-face time, preparing to see the patient,performing a medically appropriate examination, counseling and educating the patient,  documenting clinical information in the electronic health record,  and care coordination.   Samuel Kaufmann  PA-C Dept of Hematology and Oncology [facility] Phone: [phone]   08/13/2023 8:43 AM

## 2023-08-15 LAB — KAPPA/LAMBDA LIGHT CHAINS
Kappa free light chain: 38.2 mg/L — ABNORMAL HIGH (ref 3.3–19.4)
Kappa, lambda light chain ratio: 2.37 — ABNORMAL HIGH (ref 0.26–1.65)
Lambda free light chains: 16.1 mg/L (ref 5.7–26.3)

## 2023-08-18 LAB — MULTIPLE MYELOMA PANEL, SERUM
Albumin SerPl Elph-Mcnc: 3.6 g/dL (ref 2.9–4.4)
Albumin/Glob SerPl: 1 (ref 0.7–1.7)
Alpha 1: 0.2 g/dL (ref 0.0–0.4)
Alpha2 Glob SerPl Elph-Mcnc: 0.8 g/dL (ref 0.4–1.0)
B-Globulin SerPl Elph-Mcnc: 1 g/dL (ref 0.7–1.3)
Gamma Glob SerPl Elph-Mcnc: 1.6 g/dL (ref 0.4–1.8)
Globulin, Total: 3.7 g/dL (ref 2.2–3.9)
IgA: 265 mg/dL (ref 61–437)
IgG (Immunoglobin G), Serum: 1828 mg/dL — ABNORMAL HIGH (ref 603–1613)
IgM (Immunoglobulin M), Srm: 116 mg/dL (ref 20–172)
M Protein SerPl Elph-Mcnc: 0.7 g/dL — ABNORMAL HIGH
Total Protein ELP: 7.3 g/dL (ref 6.0–8.5)

## 2023-08-25 NOTE — Therapy (Unsigned)
OUTPATIENT SPEECH LANGUAGE PATHOLOGY VOICE TREATMENT    Patient Name: Samuel [name] MRN: [id number] DOB:[birth year], 67 y.o., male Today's Date: 08/26/2023  PCP: Tally Joe, MD REFERRING PROVIDER: Tally Joe, MD  END OF SESSION:  End of Session - 08/26/23 1102     Visit Number 18    Number of Visits 20    Date for SLP Re-Evaluation 09/03/23    Authorization Type medicare/ BCBS    SLP Start Time [phone]    SLP Stop Time  1015    SLP Time Calculation (min) 39 min    Activity Tolerance Patient tolerated treatment well               Past Medical History:  Diagnosis Date   Asthma    GERD (gastroesophageal reflux disease)    High cholesterol    under control.    History of blood transfusion    Hypothyroidism    Interstitial lung disease (HCC)    Multiple myeloma (HCC)    Perennial allergic rhinitis    Pneumonia    Sciatic pain, right    Seasonal allergic rhinitis    Thyroid disease    Past Surgical History:  Procedure Laterality Date   BRONCHIAL BIOPSY  06/18/2021   Procedure: BRONCHIAL BIOPSIES;  Surgeon: Leslye Peer, MD;  Location: [facility];  Service: Cardiopulmonary;;   BRONCHIAL BIOPSY  08/28/2021   Procedure: BRONCHIAL BIOPSIES;  Surgeon: Josephine Igo, DO;  Location: [facility];  Service: Cardiopulmonary;;   BRONCHIAL WASHINGS  06/18/2021   Procedure: BRONCHIAL WASHINGS;  Surgeon: Leslye Peer, MD;  Location: [facility];  Service: Cardiopulmonary;;   BRONCHIAL WASHINGS  08/28/2021   Procedure: BRONCHIAL WASHINGS;  Surgeon: Josephine Igo, DO;  Location: [facility];  Service: Cardiopulmonary;;   NASAL SINUS SURGERY     NECK SURGERY  1996   VIDEO BRONCHOSCOPY N/A 06/18/2021   Procedure: VIDEO BRONCHOSCOPY WITH FLUORO;  Surgeon: Leslye Peer, MD;  Location: [facility];  Service: Cardiopulmonary;  Laterality: N/A;   VIDEO BRONCHOSCOPY N/A 08/28/2021   Procedure: VIDEO BRONCHOSCOPY WITH FLUORO;  Surgeon: Josephine Igo, DO;  Location:  [facility];  Service: Cardiopulmonary;  Laterality: N/A;   Patient Active Problem List   Diagnosis Date Noted   Essential hypertension 07/04/2023   Coronary artery disease 11/28/2022   Mixed hyperlipidemia 11/28/2022   Recurrent sinusitis 11/04/2022   Acute bacterial rhinosinusitis 09/26/2022   Chronic respiratory failure with hypoxia (HCC) 08/01/2022   S/P bronchoscopy    ILD (interstitial lung disease) (HCC)    Preop cardiovascular exam 08/21/2021   Pneumonitis 07/20/2021   Anxiety 07/20/2021   Interstitial pulmonary disease (HCC) 07/20/2021   Abnormal CT of the chest 06/18/2021   Dyspnea 06/15/2021   Hypoxia 06/15/2021   Multiple myeloma (HCC) 03/05/2021   Multiple myeloma not having achieved remission (HCC) 03/05/2021   Bone lesion 02/14/2021   Monoclonal gammopathy 02/14/2021   High cholesterol    Allergic rhinitis 11/23/2020   Mild persistent asthma 11/23/2020   SOB (shortness of breath) 10/04/2011    Onset date: 05/15/23 (referral date)  REFERRING DIAG: R49.0 (ICD-10-CM) - Dysphonia  THERAPY DIAG: Other voice and resonance disorders  Rationale for Evaluation and Treatment: Rehabilitation  SUBJECTIVE:   SUBJECTIVE STATEMENT: "I'm having a lot congestion from asthma" Accompanied by: self  PERTINENT HISTORY: "Samuel [name] is a 66 y.o. male who presents as a return patient, for follow-up of sinusitis and globus sensation. At time of last visit on 03/14/2023, nasal culture was  obtained following thick mucoid drainage noted in the right nasal cavity. This was abnormal, and demonstrated gram-positive bacilli. Patient was subsequently treated with a 2-week course of oral antibiotics, and presents today for posttreatment imaging. He has continued to use proton pump inhibitor twice daily, but continues to experience frequent throat clearing and globus sensation. He states he has been working on dietary and lifestyle modifications as previously instructed, and has been  partially successful. He still has difficulty with frequent throat clearing and needing to cough often. He also has not been able to obtain humidification for his oxygen, as the DME company stated they needed a prescription.  Last visit 03/14/2023: Samuel [name] is a 67 y.o. male who presents as a new consult, referred by Samuel Ingles, FNP , for evaluation and treatment of sinus issues, hoarseness, and throat symptoms. He is accompanied by his wife.  Patient had history of multiple myeloma, status post treatment with chemotherapy. He developed interstitial lung disease following chemotherapeutic treatment, and is currently followed closely by pulmonology. He also endorses history of significant environmental allergies, and reports that recent allergy testing showed positivity to "everything". He was previously on immunotherapy, but this was held during his cancer treatment. He is considering starting back on the chemotherapy due to the significance of his symptoms. He is currently using Singulair, antihistamine and Patanase nasal spray on a daily basis. He also reports using nasal saline irrigations on a daily basis. He reports history of deviated nasal septum repair in 1983. He denies history of sinusitis requiring treatment with oral antibiotics in the last 12 months. His primary symptoms are nasal congestion and postnasal drainage. He denies facial pressure, pain, hyposmia, hypogeusia. He has not had any imaging of his head or sinuses. He does occasionally experience headaches. He uses oxygen on a nightly basis, and states that his oxygen is currently not humidified.  He reports symptoms of silent reflux, and states he is currently taking omeprazole on a daily basis. [name] is frequent throat clearing and globus sensation. He also reports occasional hoarseness."  PAIN: Are you having pain? No  FALLS: Has patient fallen in last 6 months? No  LIVING ENVIRONMENT: Lives with: lives with their  family Lives in: House/apartment  PLOF:Level of assistance: Independent with ADLs, Independent with IADLs Employment: Full-time employment  PATIENT GOALS: "control my breath, control my speech, and relaxation"   OBJECTIVE:   PATIENT REPORTED OUTCOME MEASURES (PROM): Communication Participation Item Bank: 9 "quite a bit" for all situations  but "very much" for getting turn in fast moving conversation  TODAY'S TREATMENT:                                                                                                                                         08-26-23: Reports feeling under the weather during the past week and experiencing congestion from asthma today. SLP led discussion about preparing for work  meetings occurring after ST. Recommended taking 5 to 10 minutes before meetings to reset breathing and prepare for prolonged speech. Pt identified x2 strategies to focus on during meeting with mod I. In 30 minute unstructured conversation, pt experienced occasional wet vocal quality, likely due to congestion, but independently self-corrected. Otherwise maintained strong breath support and clear vocal quality with rare min A and x3 cough/throat clears. Pt endorses overall improvements in conversational speech, but wants to continue targeting respiratory support.   08-12-23: Reports ongoing improvements in voice as well as breathing with less frequent cues provided by wife to use strategies. Continue to target use of diaphragmatic breathing, intentional upper body relaxation, and forward projection of voice in dual tasking scenarios. Pt aware of increased tension and reduced abdominal breathing during transition periods that include dividing attention between talking and other task (walking, answering phone). Focused on being intentional with relaxation and breath support to improve vocal quality and intensity during targeted dual tasking activities. Occasional min cues required to reduce tension,  optimize breathing pattern, and project voice during these tasks.   08-06-23: Entered with hoarse vocal quality that improved independently, with pt stating that he focused on using forward resonance. Maintained relaxed appearance, strong breath support, and clear vocal quality during 20-minute unstructured conversation. SLP directed pt to walk down the hall and return to therapy room to read a short passage x2 to target increased breath support and clear vocal quality in speech following exertion. Successfully completed task with occasional fading to rare min A, with improved breath support and vocal quality noted in the 2nd attempt. Benefited from cues to reset before beginning to read and take intentional pauses between sentences. Pt self-rated first attempt a 7.5 and second attempt an 8.5-9 out of 10. Addressed breath support during exertion by completing walking and talking task. Pt maintained structured conversation while completing laps around therapy gym with occasional mod fading to min A to take breaths in between sentences and stay relaxed. Increased success noted in additional trial.   07-29-23: Reported ongoing HEP completion. Endorsed overall improvement of SOVT exercises, although some days are better than others. Targeted SOVTE to reduce vocal tension and improve clarity. Completed sustained exhale, hum, and glides into water with occasional min A to reduce upper body tension and take reset breaths. Introduced straw talking exercise with short phrases to improve forward resonance. Used negative practice to alternate between back-focused voice and forward resonance to improve awareness of vocal quality. Accurately read aloud a list of 8-9 word sentences using forward resonance with occasional min A. Back-focused voice more apparent at the end of sentences. Benefited from cues to break up long sentences with a pause and reset with a straw or hum.  07-23-23: Entered with clear vocal quality and  reported completing SOVT HEP intermittently. Targeted SOVT exercises to reduce tension in vocal folds and improve vocal quality. Aware he can sustain exhale longer than before during exercises (averaged 6 sec for breath only). Completed hum, accents, and happy birthday into water given occasional fading to rare min A to relax upper body tension and increase breath support. Maintained 15 minute conversation with clear vocal quality and minimal upper body tension independently. Pt endorsed increasing awareness of body tension in the community; however, his wife occasionally cues him to relax which causes frustration. SLP counseled pt that the strategies he is learning will take time to master in the community. No coughing or throat clearing observed throughout session.   07-15-23: Endorsed feeling weak over  last week, which reflected in low intensity, breathy vocal quality. Instructed diaphragmatic breathing to optimize breath support, which pt able to demo with mod I. Requested repeat education of SOVT exercises d/t lack of completion. Occasional mod A provided to optimize labial seal, breath support, and intentional intensity. Improved vocal quality following warm up exercises. Pt able to demonstrate clear vocal quality with projection for short structured conversation. Demonstrated awareness of reducing intensity and clarity related to fatigue. Recommended prioritizing clear vocal quality for short durations. Encouraged patient to continue HEP.   07-09-23: Endorsed consuming meds/foods that increased reflux symptoms. Targeted SOVT exercises without water to reduce tension and improve vocal quality. Pt required occasional min A to optimize lip seal around straw. Achieved success with sustained hum, glides, accents, and song with occasional mod cues to reset breath support and send voice through the straw. SLP educated pt on subsystems of voicing to increase pt understanding. Updated HEP to include SOVT with  handout provided. Pt reporting subjective improvements in voice/cough and appreciation for SLP team assistance. Affirmed positive progress exhibited thus far.   07-07-23: Pt endorsed improved vocal quality during HEP tasks (see subjective statement). Targeted sustained phonation to increase vocal quality and intensity with optimal breath support. Pt maintained high 70s dB on sustained /a/ for 4-5 seconds with occasional min A to decrease upper body tension. Noted increase in tension as pt attempted to increase volume. SLP introduced and modeled flow phonation to reduce muscle tension. Pt demonstrated flow phonation structured tasks with occasional mod A to reset his breath and send his voice forward. Noted pt needed three to five abdominal breaths between repetitions to regain adequate breathe support. Rare throat clearing and coughing observed throughout the session with occasional min A to use cough suppressant strategies.   07-02-23: Endorsed baseline breathiness today. Recently received steroid injection/prescription from allergist. Provided rec for increased hydration and use of humidification system d/t complaint of frequent dry throat. Targeted increased vocal intensity and clarity via sustained phonation exercises and pitch glides. Intermittent mod A required to optimize breath support and projection of voice to improve vocal quality. Provided as HEP. In subsequent conversation, pt presented with variations in vocal quality (hoarse versus clear), with pt able to project for improved clarity with usual min A.   06-25-23: Pt exhibiting increasingly independent carryover of cough suppression techniques, throat clear alternatives, and diaphragmatic breathing. Occasional non-verbal min A required to reduce upper body tension and use abdominal breath at the conversational level. No coughing evidenced throughout the session, infrequent throat clears followed by a sip of water. Vocal quality and intensity appeared  to be impacted by general fatigue; however, able to demonstrate clear voice with SLP model.   06-24-23: Endorsed some overall improvements in coughing and voice. Endorsed significant coughing attack s/p exercise. Deduced coughing attack prompted by reduced intentional breath support and type of exercise (plank). Provided instruction and demo of inhale/exhale on exertion to reduce VCD during exercise and pre-medicating with learned breathing technique prior to initiating conversation and physical movement. Pt only exhibited cough x1 today and no throat clearing evidenced. Pt able to suppress cough/throat clear x3 independently. Demonstrates improved vocal quality with focus on diaphragmatic breathing and awareness of upper body tension in structured conversation.   06/18/23: Demonstrating improved awareness and correction of upper body tension. Minimal throat clearing observed throughout the session. Implementing throat clear alternatives with mod I. Targeted reduction of body tension and increased breath support during structured reading task. Able to reduce upper body  tension and use abdominal breathing in sentences and short paragraphs with occasional fading min A. Intermittent but mild coughing attacks noted with occasional cues required to suppress. Updated HEP to include structured reading practice.   06/17/23: See subjective statement - cues effective to reduce upper body tension and use diaphragmatic breathing when heightened stress exhibite. Suggested setting intent with abdominal breaths x3 prior to answering phone. Rare coughing x2, which subsided with mod A to reduce tension and focus on breathing. Targeted conversation today with intermittent min A required to reduce shoulder tension, focus on abdominal breath with hand on abdomen, and project voice forward. Utilize fidget aid to aid relaxation with mild benefit.   06/11/23: Reported positive affirmations from colleagues re: improved voice quality and  decreased coughing. Today, occasional min A required to ID and reduce upper body tension, use breathing techniques to suppress cough, and increase vocal intensity in conversation. Provided recommendations to aid awareness and mitigation of cough based on initial symptoms and how to manage breath support during transitions periods.   06/09/23: Reported good carryover of trained techniques to suppress cough/throat clearing with benefit endorsed. Pt able to independently reduce/suppress cough/throat clear with ~60% accuracy this session. Occasional min A otherwise needed, mostly to relax upper body. Trialed SOVT and resonant exercises, which were limited d/t breath support and cyclical nature of VCD. Targeted "clear" voice with cue to speak out, with benefit achieved at phrase level. Plan to address next session.   06/06/23: Reviewed cyclical nature of VCD/chronic throat clearing or coughing. SLP answered pt's questions regarding role of reflux. Pt able to teach back strategies to avoid chronic throat clearing: relaxing shoulders, neck, chest, sniff and blow, hard swallow, water sips. Addressed use of strategies during conversational speech, as pt reporting can be challenging to engage in work meeting/appointments when occurring. Pt demonstrating x1 coughing episode, lasting ~20 seconds, responsive to SLP cues to use water and hard swallow. X6 throat clears, which was minimal compared to instances in which pt demonstrated mod-I employment of alternative. Generated strategy to further address stress in regards to pt perceived trigger for increasing upper body tension.   06-04-23: Pt entered with cough which persisted throughout session. Based on reported s/sx and triggers, SLP suspects chronic cough/VCD. Provided education and handout re: VCD and throat clear alternatives. Trained patient in diaphragmatic breathing and sniff-sniff-blow technique to suppress cough, in which pt able to demo with usual fading to  occasional min A. Guided patient in self-analysis of upper body tension given usual tension present in chest/neck. Tension resulted in breath hold during inhale/exhale cycle. Breathing pattern improved with use of trained techniques. Trialed SOVTE, which was impacted by impaired breath support. Will incorporate as able.    PATIENT EDUCATION: Education details: see above  Person educated: Patient and Spouse Education method: Medical illustrator Education comprehension: verbalized understanding, returned demonstration, and needs further education  HOME EXERCISE PROGRAM: Abdominal breathing, forward resonance  GOALS: Goals reviewed with patient? Yes  SHORT TERM GOALS: Target date: 06/23/2023  Pt will reduce throat clearing and coughing by 50% at STG date using trained techniques Baseline: > 9, 6 respectively; 1/0 at STG date Goal status: MET  2.  Pt will employ abdominal breathing during structured tasks with 80% accuracy given occasional min A  Baseline:  Goal status: MET  3.  Pt will employ abdominal breathing during structured conversation with 80% accuracy given occasional min A Baseline:  Goal status: MET  4.  Pt will maintain clear vocal quality in  5-10 minute structured conversations x2 given occasional min A  Baseline:  Goal status: MET    LONG TERM GOALS: Target date: 07/21/2023 (09/03/2023 for recert)   Pt will reduce throat clearing and coughing by 75% at LTG date using trained techniques Baseline:  Goal status: MET  2.  Pt will employ abdominal breathing during unstructured conversation with 80% accuracy given rare min A Baseline:  Goal status: MET  3.  Pt will maintain clear vocal quality in 15+ minute unstructured conversations x2 given rare min A  Baseline: 07-23-23, 08-06-23 Goal status: MET   4.  Pt will carryover trained voice techniques during community outings x2 with less frequent cues required by wife  Baseline: 08-12-23 Goal status: IN  PROGRESS  5.  Pt will report improved communication effectiveness via PROM by 2 pts at last ST session  Baseline: CPIB=9 Goal status: IN PROGRESS (at recert)  ASSESSMENT:  CLINICAL IMPRESSION: Patient is a 67 y.o. M who was seen today for dysphonia. PMHX significant for Multiple Myeloma, interstitial lung disease, and GERD. Conducted ongoing education and instruction of relaxed diaphragmatic breathing and vocal techniques, with improving carryover exhibited for discourse and exertion tasks. Rare throat clearing and coughing observed today. Pt continuing to benefit from skilled ST intervention, such as training vocal hygiene protocol and voice techniques to optimize vocal quality and communication effectiveness.   OBJECTIVE IMPAIRMENTS: include voice disorder. These impairments are limiting patient from effectively communicating at home and in community. Factors affecting potential to achieve goals and functional outcome are co-morbidities. Patient will benefit from skilled SLP services to address above impairments and improve overall function.  REHAB POTENTIAL: Good  PLAN:  SLP FREQUENCY: 2x/week (decrease to 1x/week)  SLP DURATION: 8 weeks (+ 6 weeks for recert)  PLANNED INTERVENTIONS: Language facilitation, Environmental controls, Cueing hierachy, Internal/external aids, Functional tasks, Multimodal communication approach, SLP instruction and feedback, Compensatory strategies, Patient/family education, and Re-evaluation    Gracy Racer, CCC-SLP 08/26/2023, 11:02 AM

## 2023-08-26 ENCOUNTER — Ambulatory Visit: Payer: Medicare Other | Attending: Family Medicine

## 2023-08-26 DIAGNOSIS — R498 Other voice and resonance disorders: Secondary | ICD-10-CM | POA: Diagnosis present

## 2023-09-08 ENCOUNTER — Ambulatory Visit: Payer: Medicare Other

## 2023-09-08 ENCOUNTER — Other Ambulatory Visit: Payer: Self-pay | Admitting: Internal Medicine

## 2023-09-08 DIAGNOSIS — R498 Other voice and resonance disorders: Secondary | ICD-10-CM | POA: Diagnosis not present

## 2023-09-08 NOTE — Patient Instructions (Signed)
Inspiratory Muscle Strength Training (IMST):   Your device is currently set at 26 cm H2O.   Exercises: 5 sets of 5 reps for 25 total reps for 5/7 days a week. Rest 1-2 minutes in between each set. We are aiming to do this for at least 4 weeks. We will periodically reassess to ensure the device is at the correct resistance to maximize your performance.   Instructions:  Place nose clip on.  Breath out all the way.  Take a short, strong inhale in. Sounds kind of like a bike pump.   Do this 5 times.  Rest 1-2 minutes in between. Take more time if needed.  Repeat process for 5 total sets.   Disclaimer: if you feel light headed or dizzy, stop and take a break. If it gets too challenging, stop the exercises and let your SLP know. Please don't alter the pressure gauge. If you have any medical concerns related to using the device, please contact your MD.

## 2023-09-08 NOTE — Therapy (Signed)
OUTPATIENT SPEECH LANGUAGE PATHOLOGY VOICE TREATMENT (RECERT)   Patient Name: Samuel [name] MRN: [id number] DOB:[birth year], 67 y.o., male Today's Date: 09/08/2023  PCP: Tally Joe, MD REFERRING PROVIDER: Tally Joe, MD  END OF SESSION:  End of Session - 09/08/23 0802     Visit Number 19    Number of Visits 23    Date for SLP Re-Evaluation 11/03/23    Authorization Type medicare/ BCBS    SLP Start Time 0803    SLP Stop Time  0845    SLP Time Calculation (min) 42 min    Activity Tolerance Patient tolerated treatment well                Past Medical History:  Diagnosis Date   Asthma    GERD (gastroesophageal reflux disease)    High cholesterol    under control.    History of blood transfusion    Hypothyroidism    Interstitial lung disease (HCC)    Multiple myeloma (HCC)    Perennial allergic rhinitis    Pneumonia    Sciatic pain, right    Seasonal allergic rhinitis    Thyroid disease    Past Surgical History:  Procedure Laterality Date   BRONCHIAL BIOPSY  06/18/2021   Procedure: BRONCHIAL BIOPSIES;  Surgeon: Leslye Peer, MD;  Location: [facility];  Service: Cardiopulmonary;;   BRONCHIAL BIOPSY  08/28/2021   Procedure: BRONCHIAL BIOPSIES;  Surgeon: Josephine Igo, DO;  Location: [facility];  Service: Cardiopulmonary;;   BRONCHIAL WASHINGS  06/18/2021   Procedure: BRONCHIAL WASHINGS;  Surgeon: Leslye Peer, MD;  Location: [facility];  Service: Cardiopulmonary;;   BRONCHIAL WASHINGS  08/28/2021   Procedure: BRONCHIAL WASHINGS;  Surgeon: Josephine Igo, DO;  Location: [facility];  Service: Cardiopulmonary;;   NASAL SINUS SURGERY     NECK SURGERY  1996   VIDEO BRONCHOSCOPY N/A 06/18/2021   Procedure: VIDEO BRONCHOSCOPY WITH FLUORO;  Surgeon: Leslye Peer, MD;  Location: [facility];  Service: Cardiopulmonary;  Laterality: N/A;   VIDEO BRONCHOSCOPY N/A 08/28/2021   Procedure: VIDEO BRONCHOSCOPY WITH FLUORO;  Surgeon: Josephine Igo, DO;   Location: [facility];  Service: Cardiopulmonary;  Laterality: N/A;   Patient Active Problem List   Diagnosis Date Noted   Essential hypertension 07/04/2023   Coronary artery disease 11/28/2022   Mixed hyperlipidemia 11/28/2022   Recurrent sinusitis 11/04/2022   Acute bacterial rhinosinusitis 09/26/2022   Chronic respiratory failure with hypoxia (HCC) 08/01/2022   S/P bronchoscopy    ILD (interstitial lung disease) (HCC)    Preop cardiovascular exam 08/21/2021   Pneumonitis 07/20/2021   Anxiety 07/20/2021   Interstitial pulmonary disease (HCC) 07/20/2021   Abnormal CT of the chest 06/18/2021   Dyspnea 06/15/2021   Hypoxia 06/15/2021   Multiple myeloma (HCC) 03/05/2021   Multiple myeloma not having achieved remission (HCC) 03/05/2021   Bone lesion 02/14/2021   Monoclonal gammopathy 02/14/2021   High cholesterol    Allergic rhinitis 11/23/2020   Mild persistent asthma 11/23/2020   SOB (shortness of breath) 10/04/2011    Onset date: 05/15/23 (referral date)  REFERRING DIAG: R49.0 (ICD-10-CM) - Dysphonia  THERAPY DIAG: Other voice and resonance disorders  Rationale for Evaluation and Treatment: Rehabilitation  SUBJECTIVE:   SUBJECTIVE STATEMENT: "The asthma has made it really challenging" Accompanied by: self  PERTINENT HISTORY: "Samuel [name] is a 68 y.o. male who presents as a return patient, for follow-up of sinusitis and globus sensation. At time of last visit on 03/14/2023, nasal culture  was obtained following thick mucoid drainage noted in the right nasal cavity. This was abnormal, and demonstrated gram-positive bacilli. Patient was subsequently treated with a 2-week course of oral antibiotics, and presents today for posttreatment imaging. He has continued to use proton pump inhibitor twice daily, but continues to experience frequent throat clearing and globus sensation. He states he has been working on dietary and lifestyle modifications as previously instructed, and has  been partially successful. He still has difficulty with frequent throat clearing and needing to cough often. He also has not been able to obtain humidification for his oxygen, as the DME company stated they needed a prescription.  Last visit 03/14/2023: [name] is a 67 y.o. male who presents as a new consult, referred by [name], FNP , for evaluation and treatment of sinus issues, hoarseness, and throat symptoms. He is accompanied by his wife.  Patient had history of multiple myeloma, status post treatment with chemotherapy. He developed interstitial lung disease following chemotherapeutic treatment, and is currently followed closely by pulmonology. He also endorses history of significant environmental allergies, and reports that recent allergy testing showed positivity to "everything". He was previously on immunotherapy, but this was held during his cancer treatment. He is considering starting back on the chemotherapy due to the significance of his symptoms. He is currently using Singulair, antihistamine and Patanase nasal spray on a daily basis. He also reports using nasal saline irrigations on a daily basis. He reports history of deviated nasal septum repair in 1983. He denies history of sinusitis requiring treatment with oral antibiotics in the last 12 months. His primary symptoms are nasal congestion and postnasal drainage. He denies facial pressure, pain, hyposmia, hypogeusia. He has not had any imaging of his head or sinuses. He does occasionally experience headaches. He uses oxygen on a nightly basis, and states that his oxygen is currently not humidified.  He reports symptoms of silent reflux, and states he is currently taking omeprazole on a daily basis. [name] is frequent throat clearing and globus sensation. He also reports occasional hoarseness."  PAIN: Are you having pain? No  FALLS: Has patient fallen in last 6 months? No  LIVING ENVIRONMENT: Lives with: lives with  their family Lives in: House/apartment  PLOF:Level of assistance: Independent with ADLs, Independent with IADLs Employment: Full-time employment  PATIENT GOALS: "control my breath, control my speech, and relaxation"   OBJECTIVE:   PATIENT REPORTED OUTCOME MEASURES (PROM): Communication Participation Item Bank: 9 "quite a bit" for all situations  but "very much" for getting turn in fast moving conversation  TODAY'S TREATMENT:                                                                                                                                         09-08-23: Continues to c/o significant asthma impacting respiratory function and subsequently voice. Initiated education and instruction of Administrator, arts (IMST)  to address impaired breath support impacting vocal intensity and clarity. Measured MIP across 3 trials: 35, 29, 17. Modified provided IMST device to 75% of MIP (35): 26.25 cm H2O. Instructed patient on how to use device and perform recommended exercises (see pt instructions for HEP). Pt able to demo 25 reps with occasional min A. Current device setting was deemed appropriate. Re-assess MIP next session.   08-26-23: Reports feeling under the weather during the past week and experiencing congestion from asthma today. SLP led discussion about preparing for work meetings occurring after ST. Recommended taking 5 to 10 minutes before meetings to reset breathing and prepare for prolonged speech. Pt identified x2 strategies to focus on during meeting with mod I. In 30 minute unstructured conversation, pt experienced occasional wet vocal quality, likely due to congestion, but independently self-corrected. Otherwise maintained strong breath support and clear vocal quality with rare min A and x3 cough/throat clears. Pt endorses overall improvements in conversational speech, but wants to continue targeting respiratory support.   08-12-23: Reports ongoing improvements in voice  as well as breathing with less frequent cues provided by wife to use strategies. Continue to target use of diaphragmatic breathing, intentional upper body relaxation, and forward projection of voice in dual tasking scenarios. Pt aware of increased tension and reduced abdominal breathing during transition periods that include dividing attention between talking and other task (walking, answering phone). Focused on being intentional with relaxation and breath support to improve vocal quality and intensity during targeted dual tasking activities. Occasional min cues required to reduce tension, optimize breathing pattern, and project voice during these tasks.   08-06-23: Entered with hoarse vocal quality that improved independently, with pt stating that he focused on using forward resonance. Maintained relaxed appearance, strong breath support, and clear vocal quality during 20-minute unstructured conversation. SLP directed pt to walk down the hall and return to therapy room to read a short passage x2 to target increased breath support and clear vocal quality in speech following exertion. Successfully completed task with occasional fading to rare min A, with improved breath support and vocal quality noted in the 2nd attempt. Benefited from cues to reset before beginning to read and take intentional pauses between sentences. Pt self-rated first attempt a 7.5 and second attempt an 8.5-9 out of 10. Addressed breath support during exertion by completing walking and talking task. Pt maintained structured conversation while completing laps around therapy gym with occasional mod fading to min A to take breaths in between sentences and stay relaxed. Increased success noted in additional trial.   07-29-23: Reported ongoing HEP completion. Endorsed overall improvement of SOVT exercises, although some days are better than others. Targeted SOVTE to reduce vocal tension and improve clarity. Completed sustained exhale, hum, and  glides into water with occasional min A to reduce upper body tension and take reset breaths. Introduced straw talking exercise with short phrases to improve forward resonance. Used negative practice to alternate between back-focused voice and forward resonance to improve awareness of vocal quality. Accurately read aloud a list of 8-9 word sentences using forward resonance with occasional min A. Back-focused voice more apparent at the end of sentences. Benefited from cues to break up long sentences with a pause and reset with a straw or hum.  07-23-23: Entered with clear vocal quality and reported completing SOVT HEP intermittently. Targeted SOVT exercises to reduce tension in vocal folds and improve vocal quality. Aware he can sustain exhale longer than before during exercises (averaged 6 sec for breath only).  Completed hum, accents, and happy birthday into water given occasional fading to rare min A to relax upper body tension and increase breath support. Maintained 15 minute conversation with clear vocal quality and minimal upper body tension independently. Pt endorsed increasing awareness of body tension in the community; however, his wife occasionally cues him to relax which causes frustration. SLP counseled pt that the strategies he is learning will take time to master in the community. No coughing or throat clearing observed throughout session.    PATIENT EDUCATION: Education details: see above  Person educated: Patient and Spouse Education method: Medical illustrator Education comprehension: verbalized understanding, returned demonstration, and needs further education  HOME EXERCISE PROGRAM: Abdominal breathing, forward resonance  GOALS: Goals reviewed with patient? Yes  SHORT TERM GOALS: Target date: 06/23/2023  Pt will reduce throat clearing and coughing by 50% at STG date using trained techniques Baseline: > 9, 6 respectively; 1/0 at STG date Goal status: MET  2.  Pt will  employ abdominal breathing during structured tasks with 80% accuracy given occasional min A  Baseline:  Goal status: MET  3.  Pt will employ abdominal breathing during structured conversation with 80% accuracy given occasional min A Baseline:  Goal status: MET  4.  Pt will maintain clear vocal quality in 5-10 minute structured conversations x2 given occasional min A  Baseline:  Goal status: MET    LONG TERM GOALS: Target date: 07/21/2023 (11/03/2023 for recert)   Pt will reduce throat clearing and coughing by 75% at LTG date using trained techniques Baseline:  Goal status: MET  2.  Pt will employ abdominal breathing during unstructured conversation with 80% accuracy given rare min A Baseline:  Goal status: MET  3.  Pt will maintain clear vocal quality in 15+ minute unstructured conversations x2 given rare min A  Baseline: 07-23-23, 08-06-23 Goal status: MET   4.  Pt will carryover trained voice techniques during community outings x2 with less frequent cues required by wife  Baseline: 08-12-23 Goal status: IN PROGRESS (at recert)  5.  Pt will complete IMST HEP with rare min A resulting in increased MIP at 4 weeks Baseline: MIP=26.25 Goal status: NEW (at recert)  6.  Pt will report improved communication effectiveness via PROM by 2 pts at last ST session  Baseline: CPIB=9 Goal status: IN PROGRESS (at recert)  ASSESSMENT:  CLINICAL IMPRESSION: Patient is a 67 y.o. M who was seen today for dysphonia. PMHX significant for Multiple Myeloma, interstitial lung disease, and GERD. Conducted ongoing education and instruction of relaxed diaphragmatic breathing and vocal techniques. Initiated education and instruction of IMST to optimize breath support and vocal quality. Additional education and training required to maximize carryover and performance with new intervention. Pt continuing to benefit from skilled ST intervention, such as training vocal hygiene protocol and voice techniques to  optimize vocal quality and communication effectiveness.   OBJECTIVE IMPAIRMENTS: include voice disorder. These impairments are limiting patient from effectively communicating at home and in community. Factors affecting potential to achieve goals and functional outcome are co-morbidities. Patient will benefit from skilled SLP services to address above impairments and improve overall function.  REHAB POTENTIAL: Good  PLAN:  SLP FREQUENCY: 2x/week (decrease to every other week)  SLP DURATION: 8 weeks (+ 8 additional weeks for recert)  PLANNED INTERVENTIONS: Language facilitation, Environmental controls, Cueing hierachy, Internal/external aids, Functional tasks, Multimodal communication approach, SLP instruction and feedback, Compensatory strategies, Patient/family education, and Re-evaluation    Gracy Racer, CCC-SLP 09/08/2023, 9:06  AM

## 2023-09-09 ENCOUNTER — Telehealth: Payer: Self-pay | Admitting: Pharmacy Technician

## 2023-09-09 ENCOUNTER — Other Ambulatory Visit (HOSPITAL_COMMUNITY): Payer: Self-pay

## 2023-09-09 ENCOUNTER — Telehealth: Payer: Self-pay | Admitting: Internal Medicine

## 2023-09-09 NOTE — Telephone Encounter (Signed)
PT would like to see Dr. Marchelle Gearing for asthma exacerbation. No appts avail. Even if he can see him televisit or call something in.

## 2023-09-09 NOTE — Telephone Encounter (Signed)
I can see him Friday, 09/12/2023 at 11:45 AM or can start clinic early at 8:15 AM.  See him in a quick 15-minute visit.  Meawnile, Take prednisone 40 mg daily x 2 days, then 20mg  daily x 2 days, then 10mg  daily x 2 days, then 5mg  daily x 2 days and stop or go to baseline dose

## 2023-09-09 NOTE — Telephone Encounter (Signed)
Pharmacy Patient Advocate Encounter   Received notification from CoverMyMeds that prior authorization for ezetimibe-simvstatin is required/requested.   Insurance verification completed.   The patient is insured through  [employer]  .   Per test claim: PA required; PA submitted to above mentioned insurance via CoverMyMeds Key/confirmation #/EOC [id number] Status is pending

## 2023-09-10 ENCOUNTER — Inpatient Hospital Stay: Payer: Medicare Other | Attending: Hematology and Oncology

## 2023-09-10 DIAGNOSIS — C9000 Multiple myeloma not having achieved remission: Secondary | ICD-10-CM | POA: Diagnosis present

## 2023-09-10 LAB — CMP (CANCER CENTER ONLY)
ALT: 21 U/L (ref 0–44)
AST: 29 U/L (ref 15–41)
Albumin: 4.3 g/dL (ref 3.5–5.0)
Alkaline Phosphatase: 78 U/L (ref 38–126)
Anion gap: 5 (ref 5–15)
BUN: 13 mg/dL (ref 8–23)
CO2: 31 mmol/L (ref 22–32)
Calcium: 9.8 mg/dL (ref 8.9–10.3)
Chloride: 93 mmol/L — ABNORMAL LOW (ref 98–111)
Creatinine: 0.93 mg/dL (ref 0.61–1.24)
GFR, Estimated: >60 mL/min (ref >60)
Glucose, Bld: 94 mg/dL (ref 70–99)
Potassium: 4.3 mmol/L (ref 3.5–5.1)
Sodium: 129 mmol/L — ABNORMAL LOW (ref 135–145)
Total Bilirubin: 0.5 mg/dL (ref <1.2)
Total Protein: 8.6 g/dL — ABNORMAL HIGH (ref 6.5–8.1)

## 2023-09-10 LAB — CBC WITH DIFFERENTIAL (CANCER CENTER ONLY)
Abs Immature Granulocytes: 0.01 10*3/uL (ref 0.00–0.07)
Basophils Absolute: 0 10*3/uL (ref 0.0–0.1)
Basophils Relative: 0 %
Eosinophils Absolute: 0.2 10*3/uL (ref 0.0–0.5)
Eosinophils Relative: 4 %
HCT: 45.5 % (ref 39.0–52.0)
Hemoglobin: 15.5 g/dL (ref 13.0–17.0)
Immature Granulocytes: 0 %
Lymphocytes Relative: 20 %
Lymphs Abs: 1.1 10*3/uL (ref 0.7–4.0)
MCH: 30.3 pg (ref 26.0–34.0)
MCHC: 34.1 g/dL (ref 30.0–36.0)
MCV: 88.9 fL (ref 80.0–100.0)
Monocytes Absolute: 0.4 10*3/uL (ref 0.1–1.0)
Monocytes Relative: 8 %
Neutro Abs: 3.7 10*3/uL (ref 1.7–7.7)
Neutrophils Relative %: 68 %
Platelet Count: 235 10*3/uL (ref 150–400)
RBC: 5.12 MIL/uL (ref 4.22–5.81)
RDW: 12.8 % (ref 11.5–15.5)
WBC Count: 5.4 10*3/uL (ref 4.0–10.5)
nRBC: 0 % (ref 0.0–0.2)

## 2023-09-10 LAB — LACTATE DEHYDROGENASE: LDH: 287 U/L — ABNORMAL HIGH (ref 98–192)

## 2023-09-10 MED ORDER — PREDNISONE 10 MG PO TABS: ORAL_TABLET | ORAL | 0 refills | Status: DC

## 2023-09-10 NOTE — Telephone Encounter (Signed)
I called and spoke with the pt and notified of response per MR  He is able to come 09/12/23 at 8:15 am  Appt scheduled and pred taper sent

## 2023-09-11 LAB — KAPPA/LAMBDA LIGHT CHAINS
Kappa free light chain: 43.2 mg/L — ABNORMAL HIGH (ref 3.3–19.4)
Kappa, lambda light chain ratio: 2.54 — ABNORMAL HIGH (ref 0.26–1.65)
Lambda free light chains: 17 mg/L (ref 5.7–26.3)

## 2023-09-11 NOTE — Telephone Encounter (Signed)
Pharmacy Patient Advocate Encounter  Received notification from  [facility] medicare  that Prior Authorization for ezetimibe-simvastatin has been APPROVED from 09/10/23 to indefinite   PA #/Case ID/Reference #: [id number]

## 2023-09-12 ENCOUNTER — Emergency Department (HOSPITAL_BASED_OUTPATIENT_CLINIC_OR_DEPARTMENT_OTHER)
Admission: EM | Admit: 2023-09-12 | Discharge: 2023-09-12 | Disposition: A | Discharge status: Home / Self Care | Payer: Medicare Other | Attending: Emergency Medicine | Admitting: Emergency Medicine

## 2023-09-12 ENCOUNTER — Telehealth: Payer: Self-pay | Admitting: Internal Medicine

## 2023-09-12 ENCOUNTER — Encounter: Payer: Self-pay | Admitting: Internal Medicine

## 2023-09-12 ENCOUNTER — Ambulatory Visit: Payer: Medicare Other | Admitting: Internal Medicine

## 2023-09-12 ENCOUNTER — Encounter (HOSPITAL_BASED_OUTPATIENT_CLINIC_OR_DEPARTMENT_OTHER): Payer: Self-pay

## 2023-09-12 ENCOUNTER — Emergency Department (HOSPITAL_BASED_OUTPATIENT_CLINIC_OR_DEPARTMENT_OTHER): Payer: Medicare Other

## 2023-09-12 ENCOUNTER — Other Ambulatory Visit: Payer: Self-pay

## 2023-09-12 VITALS — BP 116/64 | HR 62 | Temp 97.7°F | Ht 70.0 in | Wt 201.0 lb

## 2023-09-12 DIAGNOSIS — R0602 Shortness of breath: Secondary | ICD-10-CM | POA: Diagnosis present

## 2023-09-12 DIAGNOSIS — Z20822 Contact with and (suspected) exposure to covid-19: Secondary | ICD-10-CM | POA: Diagnosis not present

## 2023-09-12 DIAGNOSIS — Z7982 Long term (current) use of aspirin: Secondary | ICD-10-CM | POA: Insufficient documentation

## 2023-09-12 DIAGNOSIS — J849 Interstitial pulmonary disease, unspecified: Secondary | ICD-10-CM | POA: Diagnosis not present

## 2023-09-12 DIAGNOSIS — J40 Bronchitis, not specified as acute or chronic: Secondary | ICD-10-CM | POA: Diagnosis not present

## 2023-09-12 DIAGNOSIS — T50905A Adverse effect of unspecified drugs, medicaments and biological substances, initial encounter: Secondary | ICD-10-CM

## 2023-09-12 DIAGNOSIS — J984 Other disorders of lung: Secondary | ICD-10-CM

## 2023-09-12 DIAGNOSIS — R0609 Other forms of dyspnea: Secondary | ICD-10-CM

## 2023-09-12 LAB — RESP PANEL BY RT-PCR (RSV, FLU A&B, COVID)  RVPGX2
Influenza A by PCR: NEGATIVE
Influenza B by PCR: NEGATIVE
Resp Syncytial Virus by PCR: NEGATIVE
SARS Coronavirus 2 by RT PCR: NEGATIVE

## 2023-09-12 LAB — BASIC METABOLIC PANEL
Anion gap: 7 (ref 5–15)
BUN: 18 mg/dL (ref 8–23)
CO2: 25 mmol/L (ref 22–32)
Calcium: 9.4 mg/dL (ref 8.9–10.3)
Chloride: 97 mmol/L — ABNORMAL LOW (ref 98–111)
Creatinine, Ser: 0.93 mg/dL (ref 0.61–1.24)
GFR, Estimated: >60 mL/min (ref >60)
Glucose, Bld: 130 mg/dL — ABNORMAL HIGH (ref 70–99)
Potassium: 4.6 mmol/L (ref 3.5–5.1)
Sodium: 129 mmol/L — ABNORMAL LOW (ref 135–145)

## 2023-09-12 LAB — CBC WITH DIFFERENTIAL/PLATELET
Abs Immature Granulocytes: 0.02 10*3/uL (ref 0.00–0.07)
Basophils Absolute: 0 10*3/uL (ref 0.0–0.1)
Basophils Relative: 0 %
Eosinophils Absolute: 0 10*3/uL (ref 0.0–0.5)
Eosinophils Relative: 0 %
HCT: 44.7 % (ref 39.0–52.0)
Hemoglobin: 15.2 g/dL (ref 13.0–17.0)
Immature Granulocytes: 0 %
Lymphocytes Relative: 10 %
Lymphs Abs: 0.5 10*3/uL — ABNORMAL LOW (ref 0.7–4.0)
MCH: 30.1 pg (ref 26.0–34.0)
MCHC: 34 g/dL (ref 30.0–36.0)
MCV: 88.5 fL (ref 80.0–100.0)
Monocytes Absolute: 0.1 10*3/uL (ref 0.1–1.0)
Monocytes Relative: 1 %
Neutro Abs: 5 10*3/uL (ref 1.7–7.7)
Neutrophils Relative %: 89 %
Platelets: 282 10*3/uL (ref 150–400)
RBC: 5.05 MIL/uL (ref 4.22–5.81)
RDW: 13.3 % (ref 11.5–15.5)
WBC: 5.6 10*3/uL (ref 4.0–10.5)
nRBC: 0 % (ref 0.0–0.2)

## 2023-09-12 LAB — D-DIMER, QUANTITATIVE: D-Dimer, Quant: 0.62 ug{FEU}/mL — ABNORMAL HIGH (ref <0.50)

## 2023-09-12 LAB — BRAIN NATRIURETIC PEPTIDE: Pro B Natriuretic peptide (BNP): 35 pg/mL (ref 0.0–100.0)

## 2023-09-12 MED ORDER — PREDNISONE 10 MG PO TABS: ORAL_TABLET | ORAL | 0 refills | Status: DC

## 2023-09-12 MED ORDER — IPRATROPIUM-ALBUTEROL 0.5-2.5 (3) MG/3ML IN SOLN
3.0000 mL | Freq: Once | RESPIRATORY_TRACT | Status: AC
  Administered 2023-09-12: 3 mL via RESPIRATORY_TRACT
  Filled 2023-09-12: qty 3

## 2023-09-12 MED ORDER — AZITHROMYCIN 250 MG PO TABS: ORAL_TABLET | ORAL | 0 refills | Status: DC

## 2023-09-12 MED ORDER — IOHEXOL 350 MG/ML SOLN
100.0000 mL | Freq: Once | INTRAVENOUS | Status: AC | PRN
  Administered 2023-09-12: 75 mL via INTRAVENOUS

## 2023-09-12 MED ORDER — ALBUTEROL SULFATE HFA 108 (90 BASE) MCG/ACT IN AERS: 2.0000 | INHALATION_SPRAY | RESPIRATORY_TRACT | Status: DC | PRN

## 2023-09-12 NOTE — Telephone Encounter (Signed)
    D-dimer high  Plan    - CTA chest today URGENT - > if not go to ER     SIGNATURE    Dr. Kalman Shan, M.D., F.C.C.P,  Pulmonary and Critical Care Medicine Staff Physician, [facility] Center Director - Interstitial Lung Disease  Program  Pulmonary Fibrosis [facility] [place], [place], [address]   Pager: [phone], If no answer  -> Check AMION or Try [phone] Telephone (clinical office): [phone] Telephone (research): [phone]  3:21 PM 09/12/2023     LABS    PULMONARY No results for input(s): "PHART", "PCO2ART", "PO2ART", "HCO3", "TCO2", "O2SAT" in the last 168 hours.  Invalid input(s): "PCO2", "PO2"  CBC Recent Labs  Lab 09/10/23 0808  HGB 15.5  HCT 45.5  WBC 5.4  PLT 235    COAGULATION No results for input(s): "INR" in the last 168 hours.  CARDIAC  No results for input(s): "TROPONINI" in the last 168 hours. Recent Labs  Lab 09/12/23 1018  PROBNP 35.0     CHEMISTRY Recent Labs  Lab 09/10/23 0808  NA 129*  K 4.3  CL 93*  CO2 31  GLUCOSE 94  BUN 13  CREATININE 0.93  CALCIUM 9.8   Estimated Creatinine Clearance: 87.5 mL/min (by C-G formula based on SCr of 0.93 mg/dL).   LIVER Recent Labs  Lab 09/10/23 0808  AST 29  ALT 21  ALKPHOS 78  BILITOT 0.5  PROT 8.6*  ALBUMIN 4.3     INFECTIOUS No results for input(s): "LATICACIDVEN", "PROCALCITON" in the last 168 hours.   ENDOCRINE CBG (last 3)  No results for input(s): "GLUCAP" in the last 72 hours.       IMAGING x48h  - image(s) personally visualized  -   highlighted in bold No results found.

## 2023-09-12 NOTE — Telephone Encounter (Signed)
Spoke to patient and wife - go to ER today 09/12/2023

## 2023-09-12 NOTE — Patient Instructions (Addendum)
ILD (interstitial lung disease) (HCC) Drug-induced pneumonitis Cough and congestion/ASthma/SEasonal allergies =  - RAST panel positive for mild IgE elevation and dust mite allergy    - Unclear what is causing your decompensation.  Unclear if it is worsening ILD or reactive airway disease  Plan -Do blood work today for D-dimer and BNP - STAT 09/12/2023  -Results might not be available till after the weekend  -Call the paging service or answering service tonight or tomorrow for results  -Take Z-Pak -Change of prednisone taper as follows  - Please take Take prednisone 40mg  once daily x 3 days, then 30mg  once daily x 3 days, then 20mg  once daily x 3 days, then prednisone 10mg  once daily  x 3 days and go to baseline dose  -Get high-resolution CT chest supine and prone in the next few weeks to a few days    Myeloma    M protein < 0.8 in July 2024 and  on .7 I Oct 2024  on monitoring plan with consideration of Daratumumab   Plan  - per [facility] and Dr Elise Benne -Video visit or face-to-face nurse practitioner within the next few weeks to discuss results

## 2023-09-12 NOTE — Addendum Note (Signed)
Addended byClyda Greener M on: 09/12/2023 10:38 AM   Modules accepted: Orders

## 2023-09-12 NOTE — ED Provider Notes (Signed)
[place] EMERGENC[facility] DEPARTMENT AT [facility] Provider Note   CSN: [id number] Arrival date & time: 09/12/23  1712     History  Chief Complaint  Patient presents with   Shortness of Breath    Samuel [name] is a 67 y.o. male.  Patient here with shortness of breath.  History of interstitial lung disease.  Sent here for CT scan of his chest to rule out blood clots.  Currently has multiple myeloma and interstitial lung disease.  He is chronically on 2 to 3 L of oxygen.  Primary doctor started him on steroids and antibiotics but had elevated D-dimer and sent for further evaluation.  He is not having any chest pain fever chills.  The history is provided by the patient.       Home Medications Prior to Admission medications   Medication Sig Start Date End Date Taking? Authorizing Provider  acyclovir (ZOVIRAX) 400 MG tablet Take 1 tablet (400 mg total) by mouth 2 (two) times daily. 03/05/21   Ulysees Barns IV, MD  albuterol (PROVENTIL) (2.5 MG/3ML) 0.083% nebulizer solution USE ONE VIAL IN NEBULIZER EVERY 6 HOURS AS NEEDED WHEEZING OR SHORTNESS OF BREATH 09/12/23   Kalman Shan, MD  albuterol (VENTOLIN HFA) 108 (90 Base) MCG/ACT inhaler Inhale 2 puffs into the lungs every 6 hours as needed for wheezing or shortness of breath. 07/31/23   Jaci Standard, MD  ALPRAZolam Prudy Feeler) 0.5 MG tablet Take 0.5 mg by mouth at bedtime.    [provider]  ARMOUR THYROID 90 MG tablet Take 90 mg by mouth daily. 09/11/23   [provider]  aspirin EC 81 MG tablet Take 1 tablet (81 mg total) by mouth daily. Swallow whole. 07/04/23   Patwardhan, Anabel Bene, MD  ASSESS FULL RANGE PEAK METER DEVI as directed. 07/15/11   [provider]  azelastine (ASTELIN) 0.1 % nasal spray Place 2 sprays into both nostrils 2 (two) times daily. Use in each nostril as directed 11/04/22   Cobb, Ruby Cola, NP  azithromycin (ZITHROMAX) 250 MG tablet Take 2 tablets on day one, then one  tablet, days 2-5. 09/12/23   Kalman Shan, MD  bisoprolol (ZEBETA) 5 MG tablet Take 1 tablet (5 mg total) by mouth daily. 10/17/21   Kalman Shan, MD  Budeson-Glycopyrrol-Formoterol (BREZTRI AEROSPHERE) 160-9-4.8 MCG/ACT AERO Inhale 2 puffs into the lungs in the morning and at bedtime. 12/04/22   Parrett, Virgel Bouquet, NP  cholecalciferol (VITAMIN D) 1000 UNITS tablet Take 3,000 Units by mouth daily.    [provider]  Dextromethorphan HBr (DELSYM PO) Take by mouth as needed. For cough    [provider]  dextromethorphan-guaiFENesin (MUCINEX DM) 30-600 MG 12hr tablet Take 1 tablet by mouth 2 (two) times daily.    [provider]  EPINEPHrine (EPI-PEN) 0.3 mg/0.3 mL DEVI Inject 0.3 mg into the muscle as needed.    [provider]  ezetimibe-simvastatin (VYTORIN) 10-40 MG tablet Take 1 tablet by mouth daily. 07/04/23   Patwardhan, Manish J, MD  fluticasone (FLONASE) 50 MCG/ACT nasal spray 1 spray.    [provider]  Guaifenesin 1200 MG TB12 1 tablet as needed Orally every 12 hrs for 5 days 05/05/23   [provider]  hydrOXYzine (ATARAX) 25 MG tablet Take 25 mg by mouth every 8 (eight) hours as needed. 05/31/23   [provider]  ibuprofen (ADVIL) 200 MG tablet Take 200 mg by mouth every 6 (six) hours as needed.  [provider]  ketoconazole (NIZORAL) 2 % cream SMARTSIG:1 Application Topical 1 to 2 Times Daily 09/21/21   [provider]  levothyroxine (SYNTHROID) 50 MCG tablet Take 50 mcg by mouth daily before breakfast. 02/10/23   [provider]  loratadine (CLARITIN) 10 MG tablet Take 10 mg by mouth daily.    [provider]  montelukast (SINGULAIR) 10 MG tablet Take 10 mg by mouth at bedtime.    [provider]  Multiple Vitamin (MULTIVITAMIN) tablet Take 1 tablet by mouth daily.    [provider]  omeprazole (PRILOSEC) 40 MG capsule Take 1 capsule (40 mg total) by mouth 2  (two) times daily. Take 30 minutes before a meal 04/21/23   Jaci Standard, MD  OXYGEN Inhale 2 L into the lungs continuous. As needed    [provider]  predniSONE (DELTASONE) 10 MG tablet 4 x 2 days, 2 x 2 days, 1 x 2 days, 1/2 x 2 days, then stop 09/10/23   Kalman Shan, MD  predniSONE (DELTASONE) 10 MG tablet Take 40 mg once daily x 3 days, then 30 mg once daily x 3 days, then 20 mg once daily x 3 days, then 10 mg once daily x 3 days and go to baseline dose. 09/12/23   Kalman Shan, MD  sildenafil (REVATIO) 20 MG tablet Take 20 mg by mouth daily as needed (ED). 07/12/19   [provider]  Simethicone (SIMETHICONE ULTRA STRENGTH) 180 MG CAPS Take 1 capsule (180 mg total) by mouth 3 (three) times daily as needed. 01/06/22   Sabas Sous, MD  sodium chloride (OCEAN) 0.65 % SOLN nasal spray Place 1 spray into both nostrils as needed for congestion.    [provider]  tamsulosin (FLOMAX) 0.4 MG CAPS capsule Take 0.4 mg by mouth daily.    [provider]  triamcinolone cream (KENALOG) 0.1 % Apply 1 application topically daily as needed (sun burn itch). 01/15/21   [provider]  Turmeric 400 MG CAPS 1 capsule Orally once a day    [provider]  UNABLE TO FIND Med Name: Allergy injections once weekly    [provider]      Allergies    Clarithromycin, Ofev [nintedanib], and Penicillins    Review of Systems   Review of Systems  Physical Exam Updated Vital Signs BP (!) 150/97 (BP Location: Right Arm)   Pulse 70   Temp 98.8 F (37.1 C) (Oral)   Resp (!) 23   Ht 5\' 10"  (1.778 m)   Wt 92 kg   SpO2 100%   BMI 29.10 kg/m  Physical Exam Vitals and nursing note reviewed.  Constitutional:      General: He is not in acute distress.    Appearance: He is well-developed.  HENT:     Head: Normocephalic and atraumatic.     Mouth/Throat:     Mouth: Mucous membranes are moist.  Eyes:     Extraocular Movements:  Extraocular movements intact.     Conjunctiva/sclera: Conjunctivae normal.     Pupils: Pupils are equal, round, and reactive to light.  Cardiovascular:     Rate and Rhythm: Normal rate and regular rhythm.     Pulses: Normal pulses.     Heart sounds: Normal heart sounds. No murmur heard. Pulmonary:     Effort: Pulmonary effort is normal. No respiratory distress.     Breath sounds: Wheezing present.  Abdominal:     Palpations: Abdomen is soft.  Tenderness: There is no abdominal tenderness.  Musculoskeletal:        General: No swelling. Normal range of motion.     Cervical back: Normal range of motion and neck supple.     Right lower leg: No edema.     Left lower leg: No edema.  Skin:    General: Skin is warm and dry.     Capillary Refill: Capillary refill takes less than 2 seconds.  Neurological:     General: No focal deficit present.     Mental Status: He is alert.  Psychiatric:        Mood and Affect: Mood normal.     ED Results / Procedures / Treatments   Labs (all labs ordered are listed, but only abnormal results are displayed) Labs Reviewed  CBC WITH DIFFERENTIAL/PLATELET - Abnormal; Notable for the following components:      Result Value   Lymphs Abs 0.5 (*)    All other components within normal limits  BASIC METABOLIC PANEL - Abnormal; Notable for the following components:   Sodium 129 (*)    Chloride 97 (*)    Glucose, Bld 130 (*)    All other components within normal limits  RESP PANEL BY RT-PCR (RSV, FLU A&B, COVID)  [id number]    EKG EKG Interpretation Date/Time:  Friday September 12 2023 17:26:44 EST Ventricular Rate:  82 PR Interval:  190 QRS Duration:  92 QT Interval:  352 QTC Calculation: 411 R Axis:   -15  Text Interpretation: Normal sinus rhythm Left ventricular hypertrophy with repolarization abnormality ( R in aVL ) Abnormal ECG When compared with ECG of 06-Jan-2022 01:38, Vent. rate has increased BY  28 BPM Confirmed by Virgina Norfolk [phone]) on  09/12/2023 6:25:00 PM  Radiology CT Angio Chest PE W and/or Wo Contrast  Result Date: 09/12/2023 CLINICAL DATA:  Shortness of breath. EXAM: CT ANGIOGRAPHY CHEST WITH CONTRAST TECHNIQUE: Multidetector CT imaging of the chest was performed using the standard protocol during bolus administration of intravenous contrast. Multiplanar CT image reconstructions and MIPs were obtained to evaluate the vascular anatomy. RADIATION DOSE REDUCTION: This exam was performed according to the departmental dose-optimization program which includes automated exposure control, adjustment of the mA and/or kV according to patient size and/or use of iterative reconstruction technique. CONTRAST:  75mL OMNIPAQUE IOHEXOL 350 MG/ML SOLN COMPARISON:  High-resolution chest CT 06/15/2023 FINDINGS: Cardiovascular: There are no filling defects within the pulmonary arteries to suggest pulmonary embolus. Aortic atherosclerosis without acute aortic findings. Borderline cardiomegaly with coronary artery calcifications. No pericardial effusion. Mediastinum/Nodes: Chronic mild mediastinal and hilar adenopathy. Reference left hilar node measuring 10 mm series 4, image 56. Unremarkable appearance of the esophagus. Lungs/Pleura: Lower lung volumes from prior CT. Subpleural and peribronchovascular reticular opacities with bronchiectasis. Mild diffuse ground-glass opacity, particularly in the lung bases, increased. This may represent superimposed infection or edema. No focal consolidation. Trachea and central airways are clear. Upper Abdomen: No acute findings. Musculoskeletal: Patient with history of multiple myeloma, subtle lesion in the posterior left sixth rib is unchanged from prior and likely myomatous, series 6, image 70. There is also a small left inferior scapular lucent lesion, series 6, image 78. No evidence of bony destructive change. Remote healed right anterior rib fractures. Review of the MIP images confirms the above findings. IMPRESSION:  1. No pulmonary embolus. 2. Lower lung volumes from prior CT. Chronic interstitial lung disease. Mild diffuse ground-glass opacity, particularly in the lung bases, increased from prior. This may represent superimposed infection  or edema. 3. Small presumed myelomatous lesions in the posterior left sixth rib and left inferior scapula. These are unchanged from prior. 4. Coronary artery calcifications. Aortic Atherosclerosis (ICD10-I70.0). Electronically Signed   By: Narda Rutherford M.D.   On: 09/12/2023 20:53    Procedures Procedures    Medications Ordered in ED Medications  ipratropium-albuterol (DUONEB) 0.5-2.5 (3) MG/3ML nebulizer solution 3 mL (3 mLs Nebulization Given 09/12/23 2010)  iohexol (OMNIPAQUE) 350 MG/ML injection 100 mL (75 mLs Intravenous Contrast Given 09/12/23 2000)    ED Course/ Medical Decision Making/ A&P                                 Medical Decision Making Amount and/or Complexity of Data Reviewed Labs: ordered. Radiology: ordered.  Risk Prescription drug management.   Samuel [name] is here with shortness of breath.  Sent for CT scan by his pulmonologist due to elevated D-dimer.  He is on steroids and antibiotics that was started today for presumed bronchitis.  Patient has a little bit of wheezing on exam but is well-appearing.  No signs of volume overload.  Is not having any chest pain.  I have no concern for ACS or volume overload.  He did have unremarkable BMP done outpatient as well today.  EKG shows sinus rhythm.  No ischemic changes.  Will get CBC BMP and CT scan of his chest to further evaluate for PE or infectious process.  Will give breathing treatment.  Differential diagnosis likely COPD exacerbation/bronchitis.  Labs unremarkable.  I reviewed interpreted labs.  CT scan shows no pulmonary embolism.  Maybe a little bit of increased groundglass opacity in the lung bases per radiology report.  Otherwise no other new changes.  He is already been prescribed  antibiotics and steroids today I think is reasonable to treat for possible bronchitis/pneumonia.  I have no concern for edema.  No concern for ACS.  Patient discharged in good condition.  Understands return precautions.  Patient understands to start antibiotics and steroids.  Follow-up with his primary doctor.  Told to return if symptoms worsen.  This chart was dictated using voice recognition software.  Despite best efforts to proofread,  errors can occur which can change the documentation meaning.         Final Clinical Impression(s) / ED Diagnoses Final diagnoses:  Bronchitis    Rx / DC Orders ED Discharge Orders     None         Virgina Norfolk, DO 09/12/23 2109

## 2023-09-12 NOTE — ED Triage Notes (Signed)
Patient arrives with complaints of shortness of breath and he wears 2L-3L of oxygen on a regular basis. Sent here due to having an Elevated D-Dimer and to Rule out clots.    Currently has Multiple Myeloma and Interstitial Lung Disease.

## 2023-09-12 NOTE — Addendum Note (Signed)
Addended byClyda Greener M on: 09/12/2023 09:53 AM   Modules accepted: Orders

## 2023-09-12 NOTE — ED Notes (Signed)
RT titrated oxygen from  4 Lpm to 3 Lpm. Pt respiratory status stable on current setting w/BLBS clr/clr dim w/no distress noted at this time. RT will continue to monitor while at [facility].

## 2023-09-12 NOTE — ED Notes (Signed)
Pt sat goal per Pulmonology Ramaswamy is >88% w/his ILD.    ILD (interstitial lung disease) (HCC) Drug-induced pneumonitis   PFT shows worsening     Plan  - o2 for goal pulse ox > 88%   09/12/23 2100  Oxygen Therapy/Pulse Ox  O2 Therapy Oxygen  O2 Flow Rate (L/min) 3 L/min  FiO2 (%) 32 %

## 2023-09-12 NOTE — Progress Notes (Signed)
IOV 10/04/2011  67 year old male. Allergies and possible asthma. Hypothyroidism. Anxiety state but no formal diagnosis. 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 Reports dyspnea. INsidious onset "all my life". Increased after starting allergy shots in June 2012. Says during hikes after he gets going it gets better. Same in gym. Notices more when he is starting out or stationary at rest. HE is not sure what it is.  Feels he needs to take a deep breath on occasion. So, now referred to cardiology and pulmonary. Says ICS Qvar in august/sept 2012 made it worse. Outside chart mentions asthma but patient says not sure if he has asthma. But reports childhood hx of asthma diagnosed by a pediatrician at age 66. Says he had similar symptoms.  Up until 1990 used to 10K but even back then had similar symptoms and would need to warm up before feeling better. Then stopped running due to neck problems. Similar during swimming exercise in 1996-1997. Quit swimming 1999. Episodes associated with wheezing but no cough. Unclear if albuterol helps but on xopenex prn esp before walking. This whole thing feels like he is not getting "enough juice". Never had PFT but recollects normal spirometry at Dr Samuel [name] office.  He has seen Dr Samuel [name] cardiology for above - treadmill and echo and carotid doppler all pending. Current pulmonary modulating drug is Singulair only  Chest x-ray 07/16/2011 is clear per my personal review.  xxxxxxxxxxxxxxxxxxxxxxxxxxxxxxxxxxxxxxxxxxxxxxxxxxxxxxxxxxxxxxxxxx  Inpatient consult 06/16/21 67 y/o male with a history of multiple myeloma diagnosed in 2022, currently undergoing treatment presented to the [facility] emergency department from his hematology oncology appointment with a chief complaint of dyspnea.  The patient smoked cigarettes briefly as a young adult but since then has lived a relatively  healthy lifestyle staying active with exercise and playing golf.  He was diagnosed with multiple myeloma in the spring 2022 when a bruise was discovered while he was receiving a sports massage.  He went for further work-up and was found to have abnormal lab work, this led to a bone marrow biopsy confirming diagnosis of multiple myeloma.  He has been followed by Dr. Leonides [name] who has been treating him with VRd chemotherapy since Mar 16, 2021.  He is currently in the middle of his fifth cycle.     Over the last several weeks he has been developing progressive shortness of breath with exertion.  He says this has been going on for about 3 to 4 weeks and has significantly worsened in the last week.  This is associated with a dry, rare cough.  He denies fevers, chills, leg swelling, weight gain, or mucus production.  In the last several days (just prior to admission) [place] and while there felt significant shortness of breath so he presented to Dr. Derek [name] office for further evaluation.  He was sent to the emergency room for evaluation further because of the severity of his shortness of breath.  In the emergency department he was noted to have hypoxemia to an O2 saturation of 87% and an abnormal chest exam.  Pulmonary and critical care medicine was consulted for further evaluation.  He says that throughout his adult life he has had what he calls "huffing" when he talks while exerting himself or with exertion.  He says he has been followed by an allergist and has been treated for asthma at times  over the years.  He does not regularly use an inhaler and he says this is not something that he routinely needs.  He was seen by my partner Dr. Marchelle [name] at 1 point in 2013 during which time he had a normal chest x-ray and normal lung function testing.   He had COVID in April 2022.   June 15, 2021 admission August 26 CT chest images independently reviewed: No pulmonary embolism, diffuse interlobular septal  thickening, pleural nodularity noted, areas of groundglass opacification with some patchy distribution particularly in the lung base, no significant pleural effusion, mild reactive appearing lymphadenopathy in the mediastinum Acute respiratory failure with hypoxemia in the setting of diffuse parenchymal lung disease of undetermined etiology: This is somewhat of a complex case given his underlying asthma and the fact that he had COVID in 2022 and in April 2022 a CT scan of his abdomen showed some ill-defined interstitial changes in the periphery of his lungs on lung windows in the bases.  Based on the time course of his illness and reports in the literature I Samuel most concerned about Samuel [name] lung toxicity (see citation below), though it is also important to evaluate for another underlying and progressive primary pulmonary ILD or less likely an opportunistic infection (history doesn't seem to support this).  Samuel [name] can cause eosinophilic pneumonia which we can assess for with a bronchoscopy, but his imaging characteristics aren't typical for this.  It is possible that the non-specific interstitial changes seen on his lungs in April 2022 were related to his recent COVID infection.  I don't think his baseline asthma explains his symptoms, though he did have a high serum eosinophil count a few weeks prior to admission (unclear significance).  Samuel [name], Samuel [name], Samuel [name], Samuel [name], Samuel [name], Samuel [name], Samuel [name], Samuel [name], Samuel [name], Samuel [name]. Bortezomib induced pulmonary toxicity: a case report and review of the literature. Samuel [name]. 2020 Dec 15;10(6):407-415. PMID: 14782956; PMCID: [id number].  06/18/21 -   Results for Samuel [name]" (MRN [id number]) as of 07/12/2021 09:13  Ref. Range 06/18/2021 12:57  Monocyte-Macrophage-Serous Fluid Latest Ref Range: 50 - 90 % 5 ([name])  Other Cells, Fluid Latest Units: % CORRELATE WITH CYTOLOGY.  Color, Fluid Latest Ref Range: YELLOW  PINK (A)  Total  Nucleated Cell Count, Fluid Latest Ref Range: 0 - 1,000 cu mm 103  Fluid Type-FCT Unknown BRONCHIAL ALVEOLAR LAVAGE  Lymphs, Fluid Latest Units: % 18  Eos, Fluid Latest Units: % 51  Appearance, Fluid Latest Ref Range: CLEAR  HAZY (A)  Neutrophil Count, Fluid Latest Ref Range: 0 - 25 % 26 ([name])  FINAL MICROSCOPIC DIAGNOSIS:  - No malignant cells identified  - Benign bronchial cells and pulmonary macrophages   Titrate off solumedrol and start prednisone 50 mg daily. Decrease by 10 mg weekly    OV 07/12/2021  Subjective:  Patient ID: Samuel [name], male , DOB: [birth year] , age 51 y.o. , MRN: [id number] , ADDRESS: [address] [place] [address] PCP Tally Joe, MD Patient Care Team: Tally Joe, MD as PCP - General (Family Medicine)  This Provider for this visit: Treatment Team:  Attending Provider: Kalman Shan, MD    07/12/2021 -   Chief Complaint  Patient presents with   Hospitalization Follow-up    Pt states he was doing better after being out of the hospital after being placed on steroids. States about a week after being out, states he started having more problems with  SOB and to today, he has had problems with SOB with exertion.   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 -presents for follow-up from the hospital.  I originally met him 10 years ago and then at that point in time he had dyspnea.  He tells me that after that he was exercising and living a good life.  Then in April 2022 got diagnosed incidentally with myeloma multiple.  Says he was caught early.  Then in May 2022 started on Samuel [name] and Samuel [name].  He says he was doing well with this up until mid July.  Up until then he was walking 1-1/2 2 miles per day 5 days a week.  Then in late July 2022 started noticing shortness of breath.  He met Dr. Leonides [name] his oncologist on May 25, 2021.  By June 01, 2021 he had severe pulmonary  infiltrates.  He says a part of then he was getting his Samuel [name] once a week along with some steroids with each dose.  At this point in time the steroid dose was cut down but he progressed and started getting worse.  Then on June 15, 2021 he was in significant respiratory distress and admitted to the hospital.  I reviewed the hospital records.  He was there for 3 days.  He underwent bronchoscopy with BAL there is also 51% eosinophilia.  Cultures are negative malignant cells are negative.  He was seen by my pulmonary colleagues.  Discussion was held with the Samuel [name] other Samuel [name] was the etiology.  Given the use and failure it looks like his Samuel [name] has been held.  He was discharged on 50 mg prednisone with advised to taper it by 10 mg/week.  Currently is on 20 mg prednisone.  He started playing golf again and feeling really good through Labor Day weekend..  Then on June 29, 2021 his Samuel [name] was not given.  Up until this point all chemo was held.  He was given Cytoxan, Zomenta and Samuel [name].  The very next in June 30, 2021 he started feeling worse.  He feels Samuel [name] is the etiology for this.  On July 06, 2021 he communicated this with Dr. Leonides [name] and all chemo has been held.  He is being referred to the bone marrow transplant program at [facility] for further evaluation.  At this point in time he says he is much better than when he was in the hospital but definitely not as good as he was prior to getting a rechallenge with Samuel [name] on June 29, 2021.  But he notices that he slowly improving.  Walking desaturation test shows that his pulse ox is holding up although he does have a tendency to desaturate.   Did extensive review of the literature.  According to up-to-date Samuel [name] is a known etiology for pulm eosinophilia but he got worse after getting rechallenged with Samuel [name].  Review of the literature shows that Samuel [name] can cause drug-induced pneumonitis and a small fraction of patients.  The  literature is  NO INFOR  as to whether these patients have eosinophilia or not but definitely they seem to be steroid responsive.  This literature is attached below.  In the timeframe also fits in with Samuel [name].  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  He currently has significant symptoms of shortness of breath.  He does not have oxygen with him at home.  Simple office walk 185 feet x  3 laps goal with forehead probe 07/12/2021    O2 used ra   Number laps completed 3   Comments about pace slow   Resting Pulse Ox/HR 100% and 69/min   Final Pulse Ox/HR 92% and 81/min   Desaturated </= 88% no   Desaturated <= 3% points Yes 8   Got Tachycardic >/= 90/min no   Symptoms at end of test No complaints   Miscellaneous comments x        Causes of Pulmonary Eosinophilia: from UPTODATE A. Nonsteroidal antiinflammatory drugs (NSAIDs) [name]. Nitrofurantoin  C. Minocycline D. Sulfonamides/ Sulfamethoxazole E. Ampicillin F. Daptomycin G.Vancomycin [name]. Dapsone I. Beta-lactam antibiotics J. Nevirapine K. Telaprevir [name]. Sulfasalazine [name]. Methotrexate [name]. Mesalamine O. Amiodarone P. Bleomycin Q. ACE Inhibitor R. Beta blocker [name]. Hydrochlorothiazides T. [name]- Tryptophan U. Allopurinol V. Carbamazepine W. Lamotrigine X. Phenytoin Y. Phenindione Z. Fluindione AA. Olanzapine AB. Oxcarbazepine AC. Strontium Ranelate AD. Lenalidomide AE. Radiographic contrast media  Samuel [name], Kalyon [name], [place] [name], Samuel [name], Samuel [name], Samuel [name], [place], Samuel [name], Samuel [name], [place], [place] [name]. Bortezomib induced pulmonary toxicity: a case report and review of the literature. Samuel [name]. 2020 Dec 15;10(6):407-415. PMID: 16109604; PMCID: [id number]. - Bortezomib  related pulmonary toxicities are rarely reported. Although the incidence of Bortezomib induced lung injury (BLI) is unknown, in a large registry study of 1010 MM patients, 45 patients were reported to have BLI by their physician. However, the causality could only be constituted in 26 patients (2.6%), with 5 of them resulting in death despite steroid treatment.In the case reports, the average number of RVD cycles until the toxicity was presented was 6.9, and the period between the development of pulmonary toxicity and the first dose of Bortezomib was 31.1 days -No mention of eosinophilia  Bortezomib therapy-related lung disease in [place] patients with multiple myeloma: Incidence, mortality and clinical characterization Charolette Child [facility] Miyao,2 Brandon Masahiko Kusumoto,5 [place] Fumikazu NWGNF,6 Excel Sugiyama,8 Kiyohiko Hatake,9 Jenelle Mages Fukuda,10 and Genene Churn [id number]  - Of the 1010 patients registered, 45 (4.5%) developed BILD, 5 (0.50%) of whom had fatal cases. The median time to BILD onset from the first bortezomib dose was 14.5 days, and most of the patients responded well to corticosteroid therapy. A retrospective review by the Lung Injury Medical Expert Panel revealed that the types with capillary leak syndrome and hypoxia without infiltrative shadows were uniquely and frequently observed in patients with BIL - no mention of eosinophilia  [email or web address].[email or web address] =- allopurinol complex multisystem Hypersensitivityey   has a past medical history of Asthma, High cholesterol, Perennial allergic rhinitis, Sciatic pain, right, Seasonal allergic rhinitis, and Thyroid disease.   reports that he quit smoki     07/20/2021  - Visit, NP Cherre Huger    67 year old male former smoker followed in our office for shortness of breath and interstitial lung disease.  Established with Dr. Marchelle [name].  Initially  consulted when inpatient with our team in August/2022.  Chief complaint at that point time was dyspnea.  He was diagnosed with multiple myeloma in the spring 2022 when a bruise was discovered while he was recovering from a sports massage.  He went for further work-up and he was found to have abnormal lab work this led to a bone marrow  biopsy confirming diagnosis of multiple myeloma.  He has been followed by Dr. Leonides [name].  He was in the middle of treating him with VRD chemotherapy since May/20 04/2021.  He was in the middle of his fifth cycle.  Patient also had COVID in April/2022.  Chest CT in April/2022 showed ill-defined interstitial changes in the periphery.  Concern for Samuel [name] lung toxicity.  Patient also had a bronchoscopy performed in 06/18/2021.  Eosinophils 51%, lymphs 18%, no malignant cells identified patient was started on a long-term prednisone taper.  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  Still currently having significant symptoms of shortness of breath.    Last seen in our office on 07/12/2021 by Dr. Marchelle [name].  Plan of care at that office visit was as follows: Stop Samuel [name], check chest x-ray, check CBC with differential, blood ESR, blood IgE, vitamin D, hemoglobin A1c, G6PD, walk test, check Ono at home, change prednisone to 50 mg daily for 2 weeks, then 40 mg daily for 2 weeks, then 30 mg daily for 2 weeks, then 20 mg daily for 2 weeks, 2-week follow-up with APP or Dr. Marchelle [name], 4-week follow-up with Dr. Marchelle [name] and 30-minute time slot.  Per chart review on 07/13/2021 Samuel [name] was discontinued.  Patient was also seen by Dr. Leonides [name] on 07/18/2021 I Samuel unable to view this note.  Patient reporting that weight team would like for him to resume Samuel [name] at a maintenance dose of 10 mg.  This is the current plan.  He will see oncology next Friday for blood work.  Patient is scheduled to complete an overnight oximetry test next Tuesday.  He remains  adherent to his prednisone taper 50 mg daily.  Patient reports that this weekend he did play 9 holes of golf on Saturday and on Sunday.  He was limited by his physical exertion.  He also exercised on Tuesday and worked out with a Psychologist, educational.  Patient is eager to get back to baseline physical activity.  Patient reports that on Wednesday (07/18/2021) of this week he had increased shortness of breath, cough, worsened acid reflux and he vomited.  Patient believes that this may also be due to the fact that he ate a dinner meal quite quickly.  This sometimes happens when he does this.  He also reports that his blood pressure was high at that time.  Patient reports that he has been off the Samuel [name] for at least 1 month.  Patient has stopped his allopurinol as of 07/18/2021.  Patient and spouse are both frustrated regarding dyspnea and have hopes that he would be improving quicker.  There are also concerns that he may have acute worsened symptoms or an acute infection such as bronchitis.  We will discuss and evaluate for this today.  Walk today in office: 07/20/2021-completed 2 laps on room air, dropped to 93%  07/26/2021- Interim hx  Patient presents today for 1 week follow-up. ILD felt to be related to drug pneumonitis from Samuel [name] as well as Samuel [name] for his treatment of multiple myeloma. BAL showed significant eosinophilia. Dr. Leonides [name] lowered dose of Samuel [name] to 10mg  daily. CXR on 07/20/21 showed chronic ILD, no definite acute findings. Ambulatory walk during his last visit showed no oxygen desaturations. During his last visit he was ordered for HRCT, PFTs and ONO.   Accompanied by his wife. He had a bad weekend, his respiratory symptoms have been slightly better the last two days. He is currently off BOTH Samuel [name] and Samuel [name] (may retry Samuel [name]  at lower dose). He stopped using BREO d/t throat irritation. Xanax has helped relieves some anxiety and chest tightness. Wife reports that he is sleeping better. He in  on prolonged prednisone taper. He will be starting 40mg  prednisone tomorrow  x 2 weeks. He is taking Singulair and generic fluticasone nasal spray. He has been off allergy shots since August. HRCT and PFTs are scheduled for next week. Awaiting results to be faxed for ONO from [employer].    OV 08/02/2021 -   Subjective:  Patient ID: Samuel [name], male , DOB: 02/17/1956 , age 21 y.o. , MRN: [id number] , ADDRESS: [address] [place] [address] PCP Tally Joe, MD Patient Care Team: Tally Joe, MD as PCP - General (Family Medicine)  This Provider for this visit: Treatment Team:  Attending Provider: Kalman Shan, MD  Type of visit: Telephone/Video Circumstance: COVID-19 national emergency Identification of patient OVERTON SAMBERG with 07-22-1956 and MRN [id number] - 2 person identifier Risks: Risks, benefits, limitations of telephone visit explained. Patient understood and verbalized agreement to proceed Anyone else on call:  - [phone] Patient location: home + wife on speaker This provider location: 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 CT abdomen/pelvis: Multiple small lytic lesions in the thoracolumbar spine and bilateral pelvis -SPEP: IgG 2,082 ([name]), [name] Protein 1.8 ([name]). IFE shows IgG monoclonal protein with kappa light chain specificity.  -LDH 169, CBC normal, CMP normal except for sodium 131 ([name]), Chloride 96 ([name]).   02/14/2021: Establish care with Georga Kaufmann PA-C 02/22/2021: bone marrow biopsy confirms the diagnosis of Multiple Myeloma with a monoclonal plasma cell population.  03/16/2021: Cycle 1 Day 1 of VRd chemotherapy  04/06/2021: Cycle 2 Day 1 of VRd chemotherapy  04/27/2021: Cycle 3 Day 1 of VRd chemotherapy  05/18/2021: Cycle 4 Day 1 of VRd chemotherapy  06/01/2021: drop  dexamethasone to 20mg  PO weekly and start lasix due to shortness of breath.  06/15/2021: Desaturation to 87% on ambulation. HELD Samuel [name] today and sent to ED for evaluation.  06/22/2021: Findings consistent with drug reaction the lungs with eosinophils on BAL.  Given these findings we will definitely hold Samuel [name] and plan to avoid pomalidomide 06/29/2021: Cycle 1 Day 1 CyBorD chemotherapy   HPI Samuel [name] 67 y.o. -there is a telephone visit.  He is supposed to see me next week but he had a CT scan of the chest 2 days ago and had pulmonary function test today.  He really wanted to discuss the results today itself.  Review of the records indicate that he still on prednisone taper.  He is able to ambulate and desaturated only after 200 feet.  There are some mild nocturnal desaturation.  We started 2 [name] of nasal cannula oxygen.  His CT scan of the chest shows diffuse groundglass opacity in a pattern that is inconsistent with UIP [less than 40% chance this is UIP] suggestive of alternate pattern.  He says he is able to do weight training exercises well but when he walks on a treadmill or does ambulation that is when it bothers him.  When he rests he is better.  He does desaturate to 80s percent.  He is wondering if this could be from asthma.  I told him otherwise.  Touch base with Dr. Leonides [name] his oncologist.  He is scheduled  for cyclophosphamide tomorrow along with low-dose Samuel [name].  This was held off recently in September 2022.  But oncology is wanting to rechallenge him with low-dose Samuel [name].  They wanted approval for this.   ONO RA 07/24/21   - - 45 min spent <88%. 7+ hours was > 90%. OVerall not bad  Plan  Start 2L [place] QHS   CT Chest data  CT Chest High Resolution  Result Date: 07/31/2021 CLINICAL DATA:  Evaluate for interstitial lung disease EXAM: CT CHEST WITHOUT CONTRAST TECHNIQUE: Multidetector CT imaging of the chest was performed following the standard protocol without intravenous  contrast. High resolution imaging of the lungs, as well as inspiratory and expiratory imaging, was performed. COMPARISON:  Chest CT dated June 15, 2021; abdomen and pelvis CT dated February 02, 2021 FINDINGS: Cardiovascular: Cardiomegaly with trace pericardial effusion. Coronary artery calcifications of the RCA and LAD. Atherosclerotic disease of the thoracic aorta. Mediastinum/Nodes: Esophagus is unremarkable. Atrophic thyroid. Mediastinal lymph nodes are decreased in size when compared with prior exam. Reference AP window lymph node on series 2, image 42 measures 1.1 cm in short axis, previously 1.3 cm. Lungs/Pleura: Central airways are patent. Images are motion degraded. Mild diffuse ground-glass opacity with peribronchovascular and subpleural reticular glass opacities and traction bronchiectasis. No clear craniocaudal predominance. Mild bilateral air trapping. 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 Stable solid right middle lobe pulmonary nodule measuring 3 mm on series 3, image 57. Upper Abdomen: No acute abnormality. Musculoskeletal: No chest wall mass or suspicious bone lesions identified. IMPRESSION: Limited evaluation due to respiratory motion artifact. Within limitations, there are diffuse bilateral ground-glass opacities with peribronchovascular and subpleural reticular opacities, traction bronchiectasis and no clear craniocaudal predominance. Differential considerations include sequela of acute lung injury, NSIP, or fibrotic HP given presence of air trapping. Mild subpleural reticular opacities were present on visualized portions of the lung on prior abdomen and pelvis CT dated February 02, 2021, although majority of findings are new. Findings are suggestive of an alternative diagnosis (not UIP) per consensus guidelines: Diagnosis of Idiopathic Pulmonary Fibrosis: An Official ATS/ERS/JRS/ALAT Clinical Practice Guideline. Samuel Rosezetta Schlatter Crit Care Med Vol 198, Iss 5, [phone], Jun 21 2017.  Small solid pulmonary nodule the right middle lobe measuring 3 mm. No follow-up needed if patient is low-risk. Non-contrast chest CT can be considered in 12 months if patient is high-risk. This recommendation follows the consensus statement: Guidelines for Management of Incidental Pulmonary Nodules Detected on CT Images: From the Fleischner Society 2017; Radiology 2017; 284:228-243. Aortic Atherosclerosis (ICD10-I70.0). Electronically Signed   By: Allegra Lai [name].D.   On: 07/31/2021 12:01         08/16/2021 -  fu drug induced pneumonitis   HPI Samuel [name] 67 y.o. - desaturated. STarted on portable o2 since yesterday and Is feeling better. On Room air say he is desaturated.  Called to discuss Bronch . He is scheduled to see DR PAtwardhan 08/22/21 wed at 8.30am. PRefers not to have bronch done at that that tme.  Discussed the consensus about having bronchoscopy with lavage to rule out any opportunistic infections.  At this time I took the opportunity of also recommending transbronchial biopsies.  He did have transbronchial biopsies when he was a lot more hypoxemic in August 2022 we will send for histopathology and was nondiagnostic.  This time I recommended we send it off for RNA genomic classifier for UIP it is not a sensitive test but it is specific.  If it comes back  positive then we would know that there is permanency to this and also its a marker of progression potentially.  He is willing to go through this.  Explained we under general anesthesia. Based on schedule will be myself or Dr. Tonia Brooms doing it.  Explained the following risks   Risks of pneumothorax, hemothorax, sedation/anesthesia complications such as cardiac or respiratory arrest or hypotension, stroke and bleeding all explained. Benefits of diagnosis but limitations of non-diagnosis also explained. Patient verbalized understanding and wished to proceed.    We then discussed pulse dose steroids.  Told him we will have to wait  close to a week to make sure there is fungal smears PCP and AFB smears and bacterial culture negative.  At that point we will have to take a decision on giving 1 g Solu-Medrol for the a day for 3 days.  Ideally would need admission he does not want this.  He wants to do with is on an outpatient setting ideally.  Explained to him about the risks with steroids such as hyperglycemia, hypertension but said that I would try to work with the DME company or outpatient/home nursing to see if this would be possible.  He was appreciative.  Also discussed with Dr. Tonia Brooms who is willing to do the biopsy based on schedule of the operating room, myself and the patient if things do not work out.  Sent a secure chat to Dr. Rosemary Holms.  Told him about the patient.  He will give [place] a clearance on 08/22/2021.  Recommended also look for BNP and heart failure.  CT Chest data  No results found.   OV 08/09/2021  Subjective:  Patient ID: Samuel [name], male , DOB: January 13, 1956 , age 47 y.o. , MRN: [id number] , ADDRESS: [address] [place] [address] PCP Tally Joe, MD Patient Care Team: Tally Joe, MD as PCP - General (Family Medicine)  This Provider for this visit: Treatment Team:  Attending Provider: Kalman Shan, MD    08/09/2021 -   Chief Complaint  Patient presents with   Follow-up    Pt states he is about the same since last visit, maybe a little better. Pt is coughing more at times and states he does cough a lot before bed.   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 -returns for follow-up.  He is finishing up 40 mg of prednisone per day.  Restarting 30 mg/day of prednisone tomorrow.  He is not really better.  Today he was able to only walk 2 out of the customary 3 laps.  And he showed a tendency to desaturate.  He says he is extremely anxious.  This is understandable.  He also states  that when he lifts weights in the gym he does not have a problem but when he exerts himself he feels worse.  Previously his nocturnal desaturation test showed abnormality and we recommended night oxygen but he declined per the CMA.  But today he and his wife tell me that they would be interested in getting some oxygen if it would help with shortness of breath.  Simple walking desaturation test does not make him qualify and will need a 6-minute walk test to qualify for portable oxygen.  They are also worried about the lack of improvement.  They are wondering about second opinion.  I did unofficially check with some of my colleagues in the [place].  No clear-cut plan has been  developed.  We discussed about the possibility of visiting Dr. Marcie Bal, ILD group in [place].  They seemed enthused about the idea but wanted me to reach out to Dr. Harrold Donath first.  In terms of his myeloma review of the medical records from office visit 08/03/2021 with Dr. Leonides [name] and talking to the patient and the labs show the myeloma still in remission.  But Dr. Leonides [name] is extremely concerned that the myeloma will come back.  Oncology still wants to rechallenge him with Samuel [name] but after my personal discussion with Dr. Leonides [name] on 08/02/2021 and concern for pulmonary toxicity chemotherapy is on hold till patient recovers.  He is also concerned about the presence of coronary artery calcification on his recent CT scan of the chest.  His dad had coronary artery disease diagnosed at 2 and had bypass.  He wants to visit this with Dr. Jacinto Halim again       08/31/2021 -video visit to discuss bronchoscopy results from 08/28/2021 and next step in plan    HPI Samuel [name] 67 y.o. -his bronchoscopy with lavage 08/28/2021 shows 40% lymphocytes.  Anything greater than 30% is against UIP.  His RNA genomic classifier is in progress and that would be a specific test although not a ensitive test for UIP.  Bacterial cultures are  negative.  He continues have shortness of breath and on room air at rest he is fine but sometimes when he goes to the bathroom he can desaturate into the high 80s.  He is frustrated by his condition.  Our original plan was to ensure no opportunistic or bacterial infections [fully understanding that MTB and fungal infections can take 6 weeks] with the current bronchoscopy and to consider bronchoalveolar lavage.  And then based on this we will schedule pulsed dose steroids.    Recently his G6PD is returned is normal.  He is not on Bactrim for prophylaxis.  Current prednisone Is 20mg  per day.  He prefers outpatient treatment plan.  We discussed the side effects of high-dose steroids including opportunistic infection, anxiety and irritability, hypertension, diabetes, other lab abnormalities.  Explained the upset benefit of potentially improving upon current ILD active inflammatory phase.  He is willing to go through this treatment.  He prefers outpatient.  He will require lab and vital sign monitoring.   his wife wanted to know if this could be sarcoidosis.  She is read some case reports of sarcoidosis and myeloma associated.  I expressed to them the clinical features do not fit in with sarcoidosis.  However we can check for autoimmune and sarcoid features  Results for KWELI, VANDEMARK" (MRN [id number]) as of 08/31/2021 16:38 ENVISIA - NEGATIVE FOR UIP   Ref. Range 06/18/2021 12:57 08/28/2021 16:51  Monocyte-Macrophage-Serous Fluid Latest Ref Range: 50 - 90 % 5 ([name]) 10 ([name])  Other Cells, Fluid Latest Units: % CORRELATE WITH CYTOLOGY. MESOTHELIAL AND BRONCHIAL LINING CELLS  Color, Fluid Latest Ref Range: YELLOW  PINK (A) COLORLESS (A)  Total Nucleated Cell Count, Fluid Latest Ref Range: 0 - 1,000 cu mm 103 183  Fluid Type-FCT Unknown BRONCHIAL ALVEOLAR LAVAGE BRONCHIAL ALVEOLAR LAVAGE  Lymphs, Fluid Latest Units: % 18 40  Eos, Fluid Latest Units: % 51 0  Appearance, Fluid Latest Ref Range: CLEAR  HAZY (A)  CLEAR (A)  Neutrophil Count, Fluid Latest Ref Range: 0 - 25 % 26 ([name]) 50 ([name])    CT Chest data  DG CHEST PORT 1 VIEW  Result Date: 08/28/2021 CLINICAL DATA:  Status  post bronchoscopy. EXAM: PORTABLE CHEST 1 VIEW COMPARISON:  Chest radiographs 07/20/2021 and CT 07/30/2021 FINDINGS: The cardiac silhouette is borderline enlarged. Lung volumes are chronically low and slightly lower than on the prior radiographs. The interstitial markings are chronically increased diffusely. No definite acute airspace consolidation, overt pulmonary edema, sizable pleural effusion, or pneumothorax is identified. Prominent gaseous distension of the stomach is partially visualized. IMPRESSION: Low lung volumes with chronic interstitial changes. Electronically Signed   By: Sebastian Ache [name].D.   On: 08/28/2021 19:10   DG C-ARM BRONCHOSCOPY  Result Date: 08/28/2021 C-ARM BRONCHOSCOPY: Fluoroscopy was utilized by the requesting physician.  No radiographic interpretation.       OV 09/25/2021  Subjective:  Patient ID: Samuel [name], male , DOB: 1956/03/05 , age 67 y.o. , MRN: [id number] , ADDRESS: [address] [place] [address] PCP Tally Joe, MD Patient Care Team: Tally Joe, MD as PCP - General (Family Medicine)  This Provider for this visit: Treatment Team:  Attending Provider: Kalman Shan, MD    09/25/2021 -   Chief Complaint  Patient presents with   Follow-up    Pt states that he is beginning to feel better after last visit. States he wears his O2 at 2L majority of the time.  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 Jeanie Sewer - 40mg  once weekly (for multiple myeloma chemotherapy [name]/V ppx)   04/09/21 - dexamethasone 4mg  tabs - 40  tabs for 28 day supply - Dr. Jeanie Sewer - 40mg  once weekly  (for multiple myeloma chemotherapy [name]/V ppx)   05/07/21 - dexamethasone 4mg  tabs - 40 tabs for 28 day supply - Dr. Jeanie Sewer - 40mg  once weekly  (for multiple myeloma chemotherapy [name]/V ppx)   06/06/21 - dexamethasone 4mg  tabs - 40 tabs for 28 day supply - Dr. Jeanie Sewer - decreased to 20mg  once weekly  06/18/21 - prednisone 10mg  tabs - 104 tabs for 34 day supply - Dr. Noralee Stain ------50mg  daily x 7 days, 40 mg daily x 7 days, 30 mg daily x 7 days, 20mg  daily x 7 days, 10mg  daily x 7 days, 5mg  daily x 7 days   07/13/21 - prednisone 10mg  tabs - 296 tab for 30 day supply - Dr. Marchelle [name] ------50mg  daily x 14 days, 40 mg daily x 14 days, 30mg  daily x 14 days, 20mg  daily thereafter  - Mid Nov 2022  - 1gm solumedrol load x 3 days as outpatient  - 09/25/2021 - 20mg  pred per day   HPI Yuren Eberts Blossom 67 y.o. -returns for follow-up.  Since his last visit we did a loading dose of Solu-Medrol 1 g daily x3 days.  This was in mid November 2022.  After that he is gone back to daily prednisone 20 mg/day.  In the midst of the high-dose steroid he did pick up hypertension and we gave him bisoprolol which he says has helped him significantly.  He is run out of the bisoprolol.  I have asked him to contact his primary care physician to manage his hypertension but we will give him a refill.    He is here for follow-up to see his current status.  He tells me that his effort tolerance is better.  He tells me in the gym he is able to do a little bit more work.  This is compared to a few months ago.  He does tell me that gym exercises are easier on him than climbing the stairs.  Stairs - he avoids and gets dyspneic. Not tested his pulse ox on stairs His subjective symptom profile is slightly better compared to October 2022 but his walking desaturation test is around the same.   So suspect amount of his interstitial lung disease might be better but suspect  still remains.  Review of his pulmonary function test from 10 years ago was normal.  In April 2022 he had early ILD.  He currently definitely has ILD.  His RNA genomic classifier is negative.  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  This would make him eligible for nintedanib. He continues on prednisone 20 mg/day.  In terms of his myeloma: He had his wife say that it is still under remission but they are worried about relapse.  They are worried about future direction and treatment of myeloma particularly because he has had issues with treatment that then resulted in acute lung injury.   Of note  - he is frustrated by poor customer service of our office workflows - Rosann Auerbach denies his RNA genomic classifer biopsy    OV 10/19/21  [name]: call to give update on conversation with Dr Samuel [name] his hematologist  1. Myeloma - latest dec 2022 blood work back - still under remission. Dr Samuel [name] indicated that highly unlikely he will be a BMT Candidate for myeloma if his lung. Has appt pending with [facility]. If not a BMT candidate - then cytoxan regimen short term would be used (indicated that is of ptioential benefit to lungs)  2. 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 Would need SLB to ID etiology preciesly but with myelom and Thereapuetic trial with steroids  and his presentation - this has not been a consideratgion till now.  Explained to Dr Samuel [name] if ILD progresses futher - patient life expectancy is limited. Discussed with patient again and he is agreeable for prednisone 15mg  per day and starting ofev (awaiting donor samples and insurance proces in 2023).   3. WE discussed that probably best to refer to [facility]  trans[facility] and hematology to see if lung transplant would be an option at all if he were to decline esp in setting of myeloma. Maybe a BMT as well . Do not know answers but wil email Dr Thad Ranger at [name] to get patient in for visit. He might well need a surgical lung biopsy but this can be addressed in due course  Patient and wife agreeable  I spent time emailing Dr Acquanetta Belling trasnplant doc at [facility] - later heard from Dr Thad Ranger- feels that Myleoma will need to be in remssion for >= 5 years before he can be considered lung transplant evaluable. They feel no need to see Mr Voisine in transplant clinicl. I subsequetly d/w Mr Mear - will revert back to holding off lung transplant evaluation. He wil proceed with ofev. He will see [facility] BMT team. Will cosndier a [facility] ILD clinic opinion after d;w hi  2nd Pulm Opinion at [facility] - Dr Sharmon Revere    Comment: The patient seems to have an inflammatory process that has resulted in progressive loss of lung function, worsening hypoxia. Unfortunately on the most recent imaging I do see some signs of fibrosis including a few areas of traction bronchiectasis although I do not see honeycombing or profuse traction bronchiectasis. We did discuss future therapies. I told him that unfortunately since his lung has already suffered injury I think he would be at risk for developing lung injury again and that many chemotherapeutics have been associated lung injury. I also told him these are idiosyncratic reactions and it was not possible to predict who on an individual basis would develop inflammation from chemotherapy to any specific agent. He brought up Cytoxan I told him that while Cytoxan issues for inflammatory lung disease on the other hand it has been associated with pulmonary inflammation. His inflammatory process is steroid responsive which is not unexpected given that eosinophils were found on BAL.  We will check oxygen assessment with exercise to  see how much oxygen as needed with more than ordinary exertion.  Plan:    - Check oxygen assessment  Will discuss with his pulmonary provider Dr. Marchelle [name] and then Dr. Anne Hahn  Thank you for the opportunity to provide consultation for your patient. If I can be of further assistance please do not hesitate to contact my office.   [facility]   ASSESSMENT / PLAN  1. Interstitial lung disease-severe DLCO reduction (33% predicted) 2. 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 Samuel [name] induced)-started August 2022 3. Faint/early ILD present in April 2022 (even prior to starting myeloma treatment) 4. Hypothyroidism-on replacement with Armour thyroid 5. Dyslipidemia 6. History of gout 7. Chronic allergic rhinitis and some sinusitis-on nasal steroids 8. Childhood history of asthma 9. 25 year history of allergy shots (between ages 82 and 33)  26. Multiple myeloma-diagnosed April 2022-received about 5 cycles of Samuel [name]/Samuel [name]/dexamethasone until August 2022    There definitely appeared to be some very early changes of ILD even back in April 2022 prior to him starting any of his myeloma treatments. However, there definitely appears to be an acute interstitial pneumonitis type picture noted on his CT scan from 06/15/2021. He was taken off all myeloma treatments at that time with concern for drug-induced pneumonitis with both Samuel [name] as well as Samuel [name] being considered probably agents. He subsequently underwent 1 additional cycle of myeloma treatment with Samuel [name] alone (without Samuel [name]) and had an acute exacerbation of dyspnea symptoms and ever since then has not received any further myeloma treatment.   I discussed with him that he has been on steroids since about August 2022 and received a steroid bolus in November 2022 with very little change overall in his lung functions. I have mentioned his serial lung function numbers in the HPI above. I do not see any significant change in  these numbers despite him receiving excellent care with Dr. Marchelle [name] in [place].   His CT chest currently shows mostly fibrotic changes with maybe some active inflammatory component still left (although the extent of active inflammation appears to be very little as compared to the initial scan from August 2022).   I discussed with him that we have very little in terms of treatments to offer him. I do not think the addition of an IL 5 inhibitor or dupilumab would be of any benefit in this situation given that his BAL eosinophil count in November was down to 0%  on steroids along with the fact that his current CT scan does not show much for active inflammation. Dr. Marchelle [name] had discussed Cytoxan with him, but I think that that would be somewhat aggressive given that he is already got an active bone marrow problem with the myeloma and so I would discourage him from getting Cytoxan.  In terms of treatment options, we are really left with only 1 option which is to rechallenge him with high-dose steroids for the next 6-8 weeks and then reassess both his CT scan as well as pulmonary function studies. With this in mind, I have the following plan for him:  1. Increase oral prednisone from his 10 mg a day that he has been currently using for the past month or so to 60 mg a day. I have outlined to taper for him on the prescription and he will see me back on the 30th of May when he is down to 50 mg a day of prednisone.   2. Continue Bactrim PCP prophylaxis. I have refilled the prescription for him.   3. Continue oxygen supplementation  4. Await the recommendations of our myeloma colleagues in Hematology  5. We cannot refer him for lung transplantation given the active diagnosis of multiple myeloma. This was discussed with him.  Overall, I told him that we likely are dealing with end-stage interstitial lung disease with very little in terms of active inflammation and something that is reversible even with  the prednisone challenge I have outlined. He was given a prescription for Ofev by Dr. Marchelle [name], but could not tolerate that due to diarrhea, nausea as well as vomiting. He has an active prescription for Esbriet, but I told him to hold off on that for the next 8 weeks while he is undergoing the prednisone trial so that we do not have any issues with him throwing up on the prednisone and potentially confusing the assessment of treatment response.  If we do not see any improvement in his lung function on CT scan on May 30th, we will slowly taper and discontinue the prednisone and have him start the Esbriet.  With regards to multiple myeloma treatment going forward, I would definitely not challenge him with Samuel [name]. I Samuel not so sure about the Samuel [name] and if that needs to be given to him in the future, we could potentially consider that under the cover of prednisone immunosuppression. This will need to be careful discussion between his Hematology and Pulmonary team given his severe interstitial lung disease in the lack of any wiggle room if he were to decline/have a flare of pneumonitis.  All questions were answered. The patient was satisfied with the visit. No learning barriers identified during the visit.  OV 12/04/2021  Subjective:  Patient ID: Samuel [name], male , DOB: 04-08-1956 , age 49 y.o. , MRN: [id number] , ADDRESS: [address] [place] [address] PCP Tally Joe, MD Patient Care Team: Tally Joe, MD as PCP - General (Family Medicine)  This Provider for this visit: Treatment Team:  Attending Provider: Kalman Shan, MD  History of COVID-19 in the April  2022 Undiagnosed early ILD in the April 2022 Drug-induced interstitial lung disease - progressive phenotype - Aug 2022 BAL: pulm eosinophilia 51% in the setting of multiple myeloma chemotherapy - aug 2022  - Nov 2022 - BAL 40% lympocytoss 0% eos  - declined by [facility] for transplant eval dec 2022 - needs 5 year sof myelmoma  remission    Prednisone history: 03/05/21 -  dexamethasone 4mg  tabs - 40 tabs for 28 day supply - Dr. Jeanie Sewer - 40mg  once weekly (for multiple myeloma chemotherapy [name]/V ppx)   04/09/21 - dexamethasone 4mg  tabs - 40 tabs for 28 day supply - Dr. Jeanie Sewer - 40mg  once weekly  (for multiple myeloma chemotherapy [name]/V ppx)   05/07/21 - dexamethasone 4mg  tabs - 40 tabs for 28 day supply - Dr. Jeanie Sewer - 40mg  once weekly  (for multiple myeloma chemotherapy [name]/V ppx)   06/06/21 - dexamethasone 4mg  tabs - 40 tabs for 28 day supply - Dr. Jeanie Sewer - decreased to 20mg  once weekly  06/18/21 - prednisone 10mg  tabs - 104 tabs for 34 day supply - Dr. Noralee Stain ------50mg  daily x 7 days, 40 mg daily x 7 days, 30 mg daily x 7 days, 20mg  daily x 7 days, 10mg  daily x 7 days, 5mg  daily x 7 days   07/13/21 - prednisone 10mg  tabs - 296 tab for 30 day supply - Dr. Marchelle [name] ------50mg  daily x 14 days, 40 mg daily x 14 days, 30mg  daily x 14 days, 20mg  daily thereafter  - Mid Nov 2022  - 1gm solumedrol load x 3 days as outpatient  - 09/25/2021 - 20mg  pred per day \ -Late December 2022/early January 2023: Start nintedanib - 12/04/21 -prednisone 15mg  per day      12/04/2021 -   Chief Complaint  Patient presents with   Follow-up    Pt states he stopped taking the OFEV a week ago due to having problems with diarrhea, nausea, and some bleeding still. States since he stopped taking it, he has not had any diarrhea the past 2 days and states the bleeding stopped 2 days ago.     HPI Samuel [name] 67 y.o. -returns for follow-up with his wife.  He tells me that in terms of his multiple myeloma he has visited [facility] hematology department and since then is followed with Dr. Leonides [name].  He also saw Dr. Iran Sizer at [facility] pulmonary.  I reviewed Dr. Ross Ludwig notes from earlier this month 2023.  The general feeling that I get is that they are very nervous to do any form of chemotherapy  in him for fear of exacerbating his lung disease.  He is extremely worried about this approach.  There is also trepidation about Cytoxan.  He is really worried about what to do if his myeloma came back.  On the other hand he is also worried about his lungs.  He said he did talk to her known physician call Alm Bustard through Zoom meeting in [place] and he has recommended that patient use 10 [name] of oxygen with exercise.  He wants higher dose concentrator for this.  I was willing to prescribe this.  He is willing to even pay for this out-of-pocket.  He is also recommended sudden breathing exercises.  Patient is trying different breathing exercises with the hope his lungs can heal.  He is aware that he might be dealing with progressive issues.  He was on nintedanib for a month and early February 2023 he called saying he was having diarrhea and also bloody stools.  He has now stopped nintedanib and for the last week he has not had a diarrhea.  For the last few days there is no bloody stools.  He does not want to do this drug again.  The side effects were quite bad.  There is no further bleeding.  He also believes his hypertension  resolved or improved after stopping nintedanib.  He did have a colonoscopy 5 years ago and since then has not had any problems.  His next colonoscopy might be in another 5 years.  At this point in time he is able to do treadmill exercise on 5 [name] and walk 30 minutes 1.7 mph.  Nevertheless when we walked him on room air here in office he desaturated quickly.  It seems like his distance to desaturation is gotten worse.  He is worried about both the myeloma and the interstitial lung disease.  We discussed options about getting further opinions.  He says he wants to go to a place with his extreme expertise about this.  We discussed the idea about having to go out of state including [place], [place] [facility] and the [facility].  I do remember a name of Dr. Dia Sitter who is professor at [facility] who is an Database administrator in myeloma.  I did mention this name to him.  I have also written to him.  I also written to 1 Dr. Rozell Searing at the pulmonary department at the [facility].  Based on the response we will facilitate a referral.  Meanwhile I did tell him that we need to protect his lungs against fibrosis.  He wants to go down on his prednisone to 10 mg/day because of the side effects of weight gain.  I agreed to do this provisionally.  But I also recommended antifibrotic pirfenidone.  We discussed the side effects of nausea anorexia and occasionally diarrhea.  He wants to reflect on this.  He is willing to meet with the pharmacist on this.  I made a referral.   No results found.    PFT  OV 01/24/2022  Subjective:  Patient ID: Samuel [name], male , DOB: 11-Jun-1956 , age 9 y.o. , MRN: [id number] , ADDRESS: [address] [place] [address] PCP Tally Joe, MD Patient Care Team: Tally Joe, MD as PCP - General (Family Medicine)  This Provider for this visit: Treatment Team:  Attending Provider: Kalman Shan, MD    01/24/2022 -   Chief Complaint  Patient presents with   Follow-up    PFT performed 12/10/21. Pt recently had a referral with [facility].  Pt states he has been doing okay since last visit   Drug-induced pneumonitis follow-up interstitial lung disease  HPI Kiko Holste Drum 67 y.o. -[facility].  Dr. Rozell Searing also called me last week.  Decided to go with high-dose prednisone because of some groundglass opacities.  Did not want to do concomitant pirfenidone because of potential side effect profile.  Patient has upcoming follow-up appointment Mar 09, 2022 at [facility] with Dr. Rozell Searing.  Also saw the myeloma specialist.  In case of recurrence some alternatives have been recommended.  He tells me that he is getting more optimistic.  He understands the significance of his disease in the severely but he is doing breathing exercises remaining  optimistic.  He is trying an organic diet.  And therefore his positive state of mind is making him feel better.  He does workout on a treadmill at 2.2 mph.  He covers 1.4 miles in 40 minutes.  He uses 8 [name] of oxygen for this.   Subjective symptom assessment score is documented below in the slightly better from last visit and significantly better compared to October 2022.  Walking desaturation test is improved from 2 months ago but is similar to follow-up 2022  PFT  OV 04/09/2022  Subjective:  Patient ID: Samuel [name], male , DOB: Jan 28, 1956 , age 46 y.o. , MRN: [id number] , ADDRESS: [address] [place] [address] PCP Tally Joe, MD Patient Care Team: Tally Joe, MD as PCP - General (Family Medicine)  This Provider for this visit: Treatment Team:  Attending Provider: Kalman Shan, MD    04/09/2022 -   Chief Complaint  Patient presents with   Follow-up    Pt states he has been doing okay since last visit but states he did get covid 6/6 and then after that he has had a cough.     HPI Esai Thaut Panebianco 67 y.o. 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  He had a second visit to [facility].  He saw Dr. Renita Papa for pulmonary.  In terms of his pulmonary status things were deemed to be stable.  He had lung function studies.  His FVC is actually improved to 1.68 [name] on 03/19/2022 and his DLCO is stable around 10.3./Slightly improved.  Overall his pulmonary function test is stable/trending in the right direction.  He is lost significant amount of weight it is if his improvement could be because of that.  However he was feeling subjectively improved.  According to the [facility] notes pirfenidone is not being recommended because he is stable but he tells me that the decision was left up to him.  Certainly they want him to preserve his lung function and lose more weight.  Today wanted to talk about  pirfenidone but in the interim immediately after coming back March 27, 2022 he emailed me saying that he had COVID.  He believes he got it at [facility]  He has taken antiviral.  After this he is better but he still having some slightly worse dyspnea on exertion than baseline.  Some slightly more subjective use of oxygen at baseline [baseline exertional pulse ox stable/slightly worse].  More coughing and wheezing than baseline.  In terms of his prednisone therapy for his ILD and this is being tapered currently 7.5 mg/day.  [facility] Dr. Rozell Searing has on a tapering regimen to off but in the middle of this he had increased respiratory symptoms.  Wife is also reporting more sciatica as the prednisone is coming down.  In terms of his hematology he has been seen by [facility] multiple myeloma program and they made specific recommendations that I reviewed.  They are going to be in touch with his local hematologist oncologist Dr. Leonides [name] but currently under remission.  Weight loss has been emphasized.  We discussed Ozempic for weight loss.      OV 06/06/2022  Subjective:  Patient ID: Samuel [name], male , DOB: Oct 17, 1956 , age 26 y.o. , MRN: [id number] , ADDRESS: [address] [place] [address] PCP Tally Joe, MD Patient Care Team: Tally Joe, MD as PCP - General (Family Medicine)  This Provider for this visit: Treatment Team:  Attending Provider: Kalman Shan, MD    06/06/2022 -  incent J Taft 67 y.o. 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  Pt states that he has not been feeling well since getting Covid May 2023 after going to [facility].     HPI Samuel [name] 67 y.o. -Returns for follow-up.  He presents with his wife Zella Ball. 2 weeks ago tapered off prednisone. Same time also  at request we started Pirfenidone he has now completed at 2 pills 3 times daily.   He is going to start 3 pills 3 times daily tomorrow.  He says that overall he is not feeling all that well.  His symptom scores of worsened.  He definitely feels more dyspneic.  He is also having worsening cough although his oxygen requirements at home are not any worse.  He is also feeling more tired.  He is having some back pain.  Had MRI and had intrathecal steroid and after that the back pain is better.  He is wondering if all the symptoms are related to coming off prednisone.  At the same time he also states his myeloma is recurring.  His urine [name] protein is spiking.  He is going to see Dr. Leonides [name] in September 2023 and get started on the new regimen recommended by [facility] that is potentially less toxic to the lungs     His pulmonary function test is better than December 2022 seems worse than May 2023 when he was at [facility].?  Related to prednisone taper.  His walking desaturation test is stable.          12/04/2022 Follow up : ILD , drug-induced pneumonitis, chronic respiratory failure, asthma Patient returns for a 1 month follow-up.  Patient complains that he continues to have ongoing cough, congestion, nasal drainage, shortness of breath.  As above patient has underlying interstitial lung disease with drug-induced pneumonitis, felt secondary to medications used for treatment of his multiple myeloma.  Medications were stopped.  Patient was treated with a steroid challenge.  Patient did try Esbriet for brief time in fall 2023 but was unable to tolerate.  Patient also had COVID 19 infection in 2022 and 2023 and felt that his cough worsened after each episode. Patient'[name] wife is also sick at home with an upper respiratory infection.  Has noticed that his cough is been worse over the last week.  Cough is very aggravating.  And affects his quality life.  Worse at night.  Patient does have a dog at home.  But says he is hypoallergenic.  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  Does not have a hot tub or basement.  No birds or chickens.  COVID-19 test and influenza test today in the office are negative.  Patient denies any hemoptysis, chest pain, orthopnea.  Does have some discolored nasal discharge as dark in the morning but clears as the day goes on.  He had similar symptoms last month and was treated with a 14-day course of antibiotics.  He was also treated in December with antibiotics and steroids.  Patient has been referred to ENT and consult is pending.  He is currently taking Advair twice daily.  Remains on Claritin daily.   OV 12/26/2022  Subjective:  Patient ID: Samuel [name], male , DOB: [birth year] , age 22 y.o. , MRN: [id number] , ADDRESS: [address] [place] [address] PCP Tally Joe, MD Patient Care Team: Tally Joe, MD as PCP - General (Family Medicine)  This Provider for this visit: Treatment Team:  Attending Provider: Kalman Shan, MD    12/26/2022 -   Chief Complaint  Patient presents with   Follow-up    Fatigue, cough and chest congestion today.  Has had some improvement.  Esbriet caused dizziness, GI upset and lethargy.      Follow up : ILD , drug-induced pneumonitis, chronic respiratory failure, asthma Esbriet stopped in  October 2023 due to inability to tolerate due to GI side effects    # IgG Kappa Multiple Myeloma 02/22/2021: bone marrow biopsy confirms the diagnosis of Multiple Myeloma with a monoclonal plasma cell population.   # History of asthma.  Mid February 2020 for mildly elevated IgE and dust mite allergy.  On RAST allergy panel.  HPI Nyzaiah Baylis Raine 67 y.o. -returns for follow-up.  Presents with his wife Zella Ball.  He has been having some cough and congestion.  Nurse practitioner Rikki Spearing in mid February 2024 did RAST allergy panel.  IgE is slightly high.  He has dust mite allergy.  He states he is known to have allergies.  He did have allergy shots for 10 years leading up until the recent illness  and then that was stopped.  His wife feels that the allergy shots did help him.  He has met with Dr. [place] [name] and they are discussing whether to restart the allergy shots.  He wanted to know if there is any contraindication.  I felt that he should definitely try allergy shots or at least Xolair.  Although I was not sure whether he should undergo skin testing again.  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  He is able to play golf although very slowly.  Walking desaturation test today was stable very slowly.  He has not had a pulmonary function test and this is scheduled in a few weeks.  I offered to postpone this given his overall subjective symptoms stability and walking desaturation test ability but he wanted to keep it and come back and see me again within the next few to several weeks.  In terms of his myeloma reviewed Dr. Jeanie Sewer notes and his [name] protein is less than 0.5.  Therefore he is still on monitoring plan with plans to start [facility] protocol if myeloma were to relapse.  He is nervous about it because of the recent ILD.  We have agreed to discuss this again if the situation were to arise.     OV 01/20/2023  Subjective:  Patient ID: Samuel [name], male , DOB: [birth year] , age 58 y.o. , MRN: [id number] , ADDRESS: [address] [place] [address] PCP Tally Joe, MD Patient Care Team: Tally Joe, MD as PCP - General (Family Medicine)  This Provider for this visit: Treatment Team:  Attending Provider: Kalman Shan, MD    01/20/2023 -   Chief Complaint  Patient presents with   Follow-up    F/up PFT     HPI Samuel [name] 67 y.o. -as I got ready to see him I fell ill and declined to see him. Needs to be rescheduled.        OV 06/12/2023  Subjective:  Patient ID: Samuel [name], male , DOB: Nov 10, 1955 , age 42 y.o. , MRN: [id number] , ADDRESS: [address] [place] [address] PCP  Tally Joe, MD Patient Care Team: Tally Joe, MD as PCP - General (Family Medicine)  This Provider for this visit: Treatment Team:  Attending Provider: Kalman Shan, MD     06/12/2023 -   Chief Complaint  Patient presents with   Follow-up    F/up on PFT    HPI Samuel [name] 67 y.o. -presents for follow-up.  His last visit with me was in March 2024.  And in the April visit had to be canceled because I was sick.  Since  then he has seen Dr. Leonides [name] his myeloma specialist.  At that visit he reported that he has got a new diagnosis of acid reflux and laryngoscopy showed inflammation.  He still reported that he was using 2 to 3 [name] oxygen at rest and at home with 10 [name] at exercise.  The [name] protein itself was deemed stable and continued monitoring decision was taken through a shared decision-making process.  Decision to start treatment was held off.  He also saw speech-language pathologist on 05/26/2023 in terms of his shortness of breath he feels stable.  In fact his subjective symptom scores are stable.  He says that when he does bench press he does not desaturate but for treadmill he will have to use a lot of oxygen.  However pulmonary function test shows a significant decline 12% compared to the recent 1.  He says he is not feeling it.  He says in fact he did a good cooperative PFT.  His sit/stand hypoxemia exercise test was also adequate.  In February 2024 he did not have any pulmonary hypertension.  His last CT scan was in February 2024 of the chest.  There is no lung cancer.  We discussed about getting BNP and echocardiogram as a screening for pulmonary hypertension and he is interested in this.  The intent would be to treat with inhaled treprostinil if he has pulmonary hypertension.  Meanwhile his [name] protein levels are higher 0.8.  Therapy is being considered.  I reviewed the intended immunomodulator therapy.  There is no report of any ILD but there is increase of shortness of breath and  respiratory infections.   OV 09/12/2023  Subjective:  Patient ID: Samuel [name], male , DOB: January 23, 1956 , age 29 y.o. , MRN: [id number] , ADDRESS: [address] [place] [address] PCP Tally Joe, MD Patient Care Team: Tally Joe, MD as PCP - General (Family Medicine)  This Provider for this visit: Treatment Team:  Attending Provider: Kalman Shan, MD    09/12/2023 -   Chief Complaint  Patient presents with   Acute Visit    SOB with exertion, cough, nasal and chest congestion, chest tightness, wheezing x 3 weeks.   Follow up : ILD , drug-induced pneumonitis, chronic respiratory failure, asthma Esbriet stopped in October 2023 due to inability to tolerate due to GI side effects    # IgG Kappa Multiple Myeloma 02/22/2021: bone marrow biopsy confirms the diagnosis of Multiple Myeloma with a monoclonal plasma cell population.   # History of asthma.  Mid February 2020 for mildly elevated IgE and dust mite allergy.  On RAST allergy panel.  HPI Samuel [name] 67 y.o. -last seen in August 2024.  There is an acute visit.  His wife Zella Ball is here with him.  She is worried that he is declining.  She tells me that he is no longer even able to walk up the golf course and he stopped playing golf a few weeks ago.  Patient states that he was playing 9 holes of golf and walking and talking and doing really well.  A few months ago he was drinking turmeric protein shakes and had bad acid reflux and after that started going to speech therapy and was really helping him.  Then in the last 3 weeks because of the change in season to fall with the onset of drop in fall leaves he started having increased nasal congestion, chest congestion, hacking cough constantly clearing his throat worsening dyspnea.  Pending.  A  lot of shallow breaths.  Sometimes he feels the shallow breaths are from anxiety he is clearing his throat a lot.  He also feels his chest is in spasms.  He stopped playing golf a week  ago.  He was only playing 3 holes last week and got short of breath.  Exertional pulse ox sometimes at home dropped to the 60s.  He had blood work 2 days ago and other than sodium labs are normal and [name] protein is 0.7 in October with a November 1 still pending.  Reviewed speech therapy notes.  Last high-resolution CT chest February 2024 without change  Echo September 2024 with grade 1 diastolic dysfunction but good ejection fraction.  He started a 8-day prednisone taper today that I called in 2 days ago  Review of medication shows he is on Ventolin, aspirin, Breztri, Flonase, guaifenesin, Singulair, Prilosec and now turmeric capsules.  He is also on sildenafil.  Not on anticoagulation.  SYMPTOM SCALE - ILD 08/09/2021 09/25/2021 218# 12/04/2021 222# 01/24/22 222# 04/09/2022 215# 06/06/2022 211# 06/12/2023 205# 09/12/2023   Current weight    Treadmill 2.2 mph.  As 1.4 miles over 40 minutes.  Uses 8 [name] oxygen Post may 2nd visit 03/27/22 -2ndcvoid     O2 use ra ra ra ra 2L eert 2L exert O2 with exertion   Shortness of Breath 0 -> 5 scale with 5 being worst (score 6 If unable to do)         At rest 0 0 0 0 0 4 0.5 2  Simple tasks - showers, clothes change, eating, shaving 2 2 1  0/5 0 3 2 3   Household (dishes, doing bed, laundry) x na 1 1 1 3 3 4   Shopping 3 1 1.5 0/5 1 3  0.5 3  Walking level at own pace 4 2 2 1 1 2  2.5 4  Walking up Stairs 5 4 3  3.5 3 2 5 5   Total (30-36) Dyspnea Score 14 9 8.5 6 6 17  14.5 21  How bad is your cough? 3 1 0.5  3.4 5 Only iwht acid reflux 4  How bad is your fatigue 0 2 0  3   3  How bad is nausea 0 0 0  0 5 0 1  How bad is vomiting?  0 0 0  0 0 0 0  How bad is diarrhea? 0 0 0  0 4 0 1  How bad is anxiety? 5 2 1   0.5 0 6 3  How bad is depression 2 1 1   0.5 0 2 3  Any chronic pain - if so where and how bad x x x  x  x 0     Simple office walk 185 feet x  3 laps goal with forehead probe 07/12/2021  08/09/2021  09/25/2021  12/04/2021  01/24/2022  04/09/2022   06/06/2022  12/26/2022  06/12/2023 09/12/2023   O2 used ra Ra3 ra ra ra ra ra ra ra  Number laps completed 3 3 but did oly 2 3 3  attempted but stopped at 2 due to deats All 3 las Stopped at [employer] all 3 Stopped at 2 1 lap and even that in half away  Comments about pace slow slow slow slow Avg pace Avg  slow slow   Resting Pulse Ox/HR 100% and 69/min 100%ad 74 98% and 75 99% RA and59 98% and HR 60 98% and HR 54 98% nand HR 90 98% [name] HR 73 100% and HR 64  Final Pulse Ox/HR 92% and 81/min 93% and 87 91% and 91 86% RA and 85 91% and HR 80 (brief 89%) 88% and HR 80 91% and HR 107 90% an HR 79 92% and HR 67 - half lap  Desaturated </= 88% no no no yes ni  no    Desaturated <= 3% points Yes 8 Yes, 7 pponts Yes, 7 points Yes, 13 poins Yes 7 points Yes, 10 points Yes 7 poin    Got Tachycardic >/= 90/min no no yes no no  uyes    Symptoms at end of test No complaints Mod-severe dyspnea Mild dyspnea No complaints none Seemed ok Mild dyspnea    Miscellaneous comments x Worse? stable ? worse     Reduced efforr tolerance   Allergies  Allergen Reactions   Clarithromycin Other (See Comments)    Hiccups   Ofev [Nintedanib] Other (See Comments)    GI bleeding   Penicillins Other (See Comments)    Child hood unsure     PFT     Latest Ref Rng & Units 06/12/2023   11:30 Samuel 01/17/2023    2:09 PM 09/26/2022   10:34 Samuel 08/01/2022    1:57 PM 06/06/2022    1:52 PM 12/10/2021    9:24 Samuel 10/16/2021    3:56 PM  ILD indicators  FVC-Pre [name] 1.19  1.36  1.32  1.55  1.43  1.49  1.35   FVC-Predicted Pre % 26  29  28   33  30  32  29   DLCO uncorrected ml/min/mmHg 9.36  12.92  9.61  10.43  13.19  8.64  9.99   DLCO UNC %Pred % 35  48  36  39  49  32  37   DLCO Corrected ml/min/mmHg 9.36  13.27  9.96  10.58  13.77  9.20  10.04   DLCO COR %Pred % 35  49  37  39  51  34  37       LAB RESULTS last 96 hours No results found.  LAB RESULTS last 90 days Recent Results (from the past 2160 hour([name]))  Kappa/lambda  light chains     Status: Abnormal   Collection Time: 06/16/23  9:10 Samuel  Result Value Ref Range   Kappa free light chain 35.2 ([name]) 3.3 - 19.4 mg/[name]   Lambda free light chains 16.4 5.7 - 26.3 mg/[name]   Kappa, lambda light chain ratio 2.15 ([name]) 0.26 - 1.65    Comment: (NOTE) Performed At: [facility] [address] [place], [place] [id number] Jolene Schimke MD QV:[id number]   CMP (Cancer Center only)     Status: Abnormal   Collection Time: 06/16/23  9:10 Samuel  Result Value Ref Range   Sodium 132 ([name]) 135 - 145 mmol/[name]   Potassium 4.2 3.5 - 5.1 mmol/[name]   Chloride 97 ([name]) 98 - 111 mmol/[name]   CO2 29 22 - 32 mmol/[name]   Glucose, Bld 75 70 - 99 mg/dL    Comment: Glucose reference range applies only to samples taken after fasting for at least 8 hours.   BUN 15 8 - 23 mg/dL   Creatinine 3.29 5.18 - 1.24 mg/dL   Calcium 9.3 8.9 - 84.1 mg/dL   Total Protein 8.1 6.5 - 8.1 g/dL   Albumin 4.2 3.5 - 5.0 g/dL   AST 32 15 - 41 U/[name]   ALT 26 0 - 44 U/[name]   Alkaline Phosphatase 68 38 - 126 U/[name]   Total Bilirubin 0.3  0.3 - 1.2 mg/dL   GFR, Estimated >16 >10 mL/min    Comment: (NOTE) Calculated using the CKD-EPI Creatinine Equation (2021)    Anion gap 6 5 - 15    Comment: Performed at [facility], [address], [place], [place] [address]  CBC with Differential (Cancer Center Only)     Status: None   Collection Time: 06/16/23  9:10 Samuel  Result Value Ref Range   WBC Count 6.2 4.0 - 10.5 K/uL   RBC 4.83 4.22 - 5.81 MIL/uL   Hemoglobin 14.3 13.0 - 17.0 g/dL   HCT 40.9 81.1 - 91.4 %   MCV 89.2 80.0 - 100.0 fL   MCH 29.6 26.0 - 34.0 pg   MCHC 33.2 30.0 - 36.0 g/dL   RDW 78.2 95.6 - 21.3 %   Platelet Count 230 150 - 400 K/uL   nRBC 0.0 0.0 - 0.2 %   Neutrophils Relative % 73 %   Neutro Abs 4.5 1.7 - 7.7 K/uL   Lymphocytes Relative 15 %   Lymphs Abs 1.0 0.7 - 4.0 K/uL   Monocytes Relative 9 %   Monocytes Absolute 0.5 0.1 - 1.0 K/uL   Eosinophils Relative 3 %   Eosinophils  Absolute 0.2 0.0 - 0.5 K/uL   Basophils Relative 0 %   Basophils Absolute 0.0 0.0 - 0.1 K/uL   Immature Granulocytes 0 %   Abs Immature Granulocytes 0.01 0.00 - 0.07 K/uL    Comment: Performed at [facility], [address] [place], [place] [address]  Multiple Myeloma Panel (SPEP&IFE w/QIG)     Status: Abnormal   Collection Time: 06/16/23  9:11 Samuel  Result Value Ref Range   IgG (Immunoglobin G), Serum 1,884 ([name]) 603 - 1,613 mg/dL   IgA 846 61 - 962 mg/dL   IgM (Immunoglobulin [name]), Srm 93 20 - 172 mg/dL   Total Protein ELP 7.2 6.0 - 8.5 g/dL   Albumin SerPl Elph-Mcnc 3.6 2.9 - 4.4 g/dL   Alpha 1 0.2 0.0 - 0.4 g/dL   Alpha2 Glob SerPl Elph-Mcnc 0.8 0.4 - 1.0 g/dL   [name]-Globulin SerPl Elph-Mcnc 0.9 0.7 - 1.3 g/dL   Gamma Glob SerPl Elph-Mcnc 1.7 0.4 - 1.8 g/dL   [name] Protein SerPl Elph-Mcnc 0.8 ([name]) Not Observed g/dL   Globulin, Total 3.6 2.2 - 3.9 g/dL   Albumin/Glob SerPl 1.1 0.7 - 1.7   IFE 1 Comment (A)     Comment: (NOTE) Immunofixation shows IgG monoclonal protein with kappa light chain specificity. PLEASE NOTE: Samples from patients receiving DARZALEX(R) (daratumumab) or SARCLISA(R)(isatuximab-irfc) treatment can appear as an "IgG kappa" and mask a complete response (CR). If this patient is receiving these therapies, this IFE assay interference can be removed by ordering test number 123218-"Immunofixation, Daratumumab-Specific, Serum" or 123062-"Immunofixation, Isatuximab-Specific, Serum" and submitting a new sample for testing or by calling the lab to add this test to the current sample.    Please Note Comment     Comment: (NOTE) Protein electrophoresis scan will follow via computer, mail, or courier delivery. Performed At: [facility] [address] [place], [place] [id number] Jolene Schimke MD [id number]   Lactate dehydrogenase (LDH)     Status: Abnormal   Collection Time: 06/16/23  9:11 Samuel  Result Value Ref Range   LDH 251 ([name]) 98 -  192 U/[name]    Comment: Performed at [facility], [address], [place], [place] [address]  ECHOCARDIOGRAM COMPLETE  Status: None   Collection Time: 07/01/23  8:58 Samuel  Result Value Ref Range   Area-P 1/2 2.23 cm2   [name]' Lateral 1.90 cm   Est EF 70 - 75%   Kappa/lambda light chains     Status: Abnormal   Collection Time: 07/16/23  8:02 Samuel  Result Value Ref Range   Kappa free light chain 30.5 ([name]) 3.3 - 19.4 mg/[name]   Lambda free light chains 15.4 5.7 - 26.3 mg/[name]   Kappa, lambda light chain ratio 1.98 ([name]) 0.26 - 1.65    Comment: (NOTE) Performed At: [facility] [address] [place], [place] [id number] Jolene Schimke MD NW:[id number]   CMP (Cancer Center only)     Status: Abnormal   Collection Time: 07/16/23  8:02 Samuel  Result Value Ref Range   Sodium 128 ([name]) 135 - 145 mmol/[name]   Potassium 4.2 3.5 - 5.1 mmol/[name]   Chloride 94 ([name]) 98 - 111 mmol/[name]   CO2 29 22 - 32 mmol/[name]   Glucose, Bld 90 70 - 99 mg/dL    Comment: Glucose reference range applies only to samples taken after fasting for at least 8 hours.   BUN 19 8 - 23 mg/dL   Creatinine 5.78 4.69 - 1.24 mg/dL   Calcium 9.2 8.9 - 62.9 mg/dL   Total Protein 8.1 6.5 - 8.1 g/dL   Albumin 4.1 3.5 - 5.0 g/dL   AST 26 15 - 41 U/[name]   ALT 28 0 - 44 U/[name]   Alkaline Phosphatase 70 38 - 126 U/[name]   Total Bilirubin 0.5 0.3 - 1.2 mg/dL   GFR, Estimated >52 >84 mL/min    Comment: (NOTE) Calculated using the CKD-EPI Creatinine Equation (2021)    Anion gap 5 5 - 15    Comment: Performed at [facility], [address] [place], [place] [address]  CBC with Differential (Cancer Center Only)     Status: Abnormal   Collection Time: 07/16/23  8:02 Samuel  Result Value Ref Range   WBC Count 11.0 ([name]) 4.0 - 10.5 K/uL   RBC 5.07 4.22 - 5.81 MIL/uL   Hemoglobin 15.2 13.0 - 17.0 g/dL   HCT 01.0 27.2 - 53.6 %   MCV 89.3 80.0 - 100.0 fL   MCH 30.0 26.0 - 34.0 pg   MCHC 33.6 30.0 - 36.0 g/dL   RDW 64.4 03.4  - 74.2 %   Platelet Count 217 150 - 400 K/uL   nRBC 0.0 0.0 - 0.2 %   Neutrophils Relative % 77 %   Neutro Abs 8.4 ([name]) 1.7 - 7.7 K/uL   Lymphocytes Relative 15 %   Lymphs Abs 1.7 0.7 - 4.0 K/uL   Monocytes Relative 6 %   Monocytes Absolute 0.7 0.1 - 1.0 K/uL   Eosinophils Relative 2 %   Eosinophils Absolute 0.2 0.0 - 0.5 K/uL   Basophils Relative 0 %   Basophils Absolute 0.0 0.0 - 0.1 K/uL   Immature Granulocytes 0 %   Abs Immature Granulocytes 0.02 0.00 - 0.07 K/uL    Comment: Performed at [facility], [address], [place], [place] [address]  Multiple Myeloma Panel (SPEP&IFE w/QIG)     Status: Abnormal   Collection Time: 07/16/23  8:03 Samuel  Result Value Ref Range   IgG (Immunoglobin G), Serum 1,875 ([name]) 603 - 1,613 mg/dL   IgA 875 61 - 643 mg/dL   IgM (Immunoglobulin [name]), Srm 117 20 - 172 mg/dL  Total Protein ELP 7.6 6.0 - 8.5 g/dL   Albumin SerPl Elph-Mcnc 3.6 2.9 - 4.4 g/dL   Alpha 1 0.3 0.0 - 0.4 g/dL   Alpha2 Glob SerPl Elph-Mcnc 1.0 0.4 - 1.0 g/dL   [name]-Globulin SerPl Elph-Mcnc 1.1 0.7 - 1.3 g/dL   Gamma Glob SerPl Elph-Mcnc 1.7 0.4 - 1.8 g/dL   [name] Protein SerPl Elph-Mcnc 0.9 ([name]) Not Observed g/dL   Globulin, Total 4.0 ([name]) 2.2 - 3.9 g/dL   Albumin/Glob SerPl 1.0 0.7 - 1.7   IFE 1 Comment (A)     Comment: (NOTE) Immunofixation shows IgG monoclonal protein with kappa light chain specificity. PLEASE NOTE: Samples from patients receiving DARZALEX(R) (daratumumab) or SARCLISA(R)(isatuximab-irfc) treatment can appear as an "IgG kappa" and mask a complete response (CR). If this patient is receiving these therapies, this IFE assay interference can be removed by ordering test number 123218-"Immunofixation, Daratumumab-Specific, Serum" or 123062-"Immunofixation, Isatuximab-Specific, Serum" and submitting a new sample for testing or by calling the lab to add this test to the current sample.    Please Note Comment     Comment: (NOTE) Protein  electrophoresis scan will follow via computer, mail, or courier delivery. Performed At: [facility] [address] [place], [place] [id number] Jolene Schimke MD MV:[id number]   Lactate dehydrogenase (LDH)     Status: Abnormal   Collection Time: 07/16/23  8:03 Samuel  Result Value Ref Range   LDH 248 ([name]) 98 - 192 U/[name]    Comment: Performed at [facility], [address] [place], [place] [address]  CBC with Differential (Cancer Center Only)     Status: None   Collection Time: 08/13/23  8:21 Samuel  Result Value Ref Range   WBC Count 7.0 4.0 - 10.5 K/uL   RBC 4.88 4.22 - 5.81 MIL/uL   Hemoglobin 14.7 13.0 - 17.0 g/dL   HCT 44.0 10.2 - 72.5 %   MCV 89.8 80.0 - 100.0 fL   MCH 30.1 26.0 - 34.0 pg   MCHC 33.6 30.0 - 36.0 g/dL   RDW 36.6 44.0 - 34.7 %   Platelet Count 222 150 - 400 K/uL   nRBC 0.0 0.0 - 0.2 %   Neutrophils Relative % 75 %   Neutro Abs 5.2 1.7 - 7.7 K/uL   Lymphocytes Relative 17 %   Lymphs Abs 1.2 0.7 - 4.0 K/uL   Monocytes Relative 6 %   Monocytes Absolute 0.4 0.1 - 1.0 K/uL   Eosinophils Relative 2 %   Eosinophils Absolute 0.1 0.0 - 0.5 K/uL   Basophils Relative 0 %   Basophils Absolute 0.0 0.0 - 0.1 K/uL   Immature Granulocytes 0 %   Abs Immature Granulocytes 0.02 0.00 - 0.07 K/uL    Comment: Performed at [facility], [address] [place], [place] [address]  CMP (Cancer Center only)     Status: Abnormal   Collection Time: 08/13/23  8:21 Samuel  Result Value Ref Range   Sodium 131 ([name]) 135 - 145 mmol/[name]   Potassium 3.9 3.5 - 5.1 mmol/[name]   Chloride 97 ([name]) 98 - 111 mmol/[name]   CO2 29 22 - 32 mmol/[name]   Glucose, Bld 99 70 - 99 mg/dL    Comment: Glucose reference range applies only to samples taken after fasting for at least 8 hours.   BUN 16 8 - 23 mg/dL   Creatinine 6.38 7.56 - 1.24 mg/dL   Calcium 9.7 8.9 - 43.3 mg/dL   Total Protein  8.2 ([name]) 6.5 - 8.1 g/dL   Albumin 4.2 3.5 - 5.0 g/dL   AST 34 15 - 41 U/[name]   ALT  33 0 - 44 U/[name]   Alkaline Phosphatase 70 38 - 126 U/[name]   Total Bilirubin 0.5 0.3 - 1.2 mg/dL   GFR, Estimated >16 >10 mL/min    Comment: (NOTE) Calculated using the CKD-EPI Creatinine Equation (2021)    Anion gap 5 5 - 15    Comment: Performed at [facility], [name] [place], [place] [address]  Multiple Myeloma Panel (SPEP&IFE w/QIG)     Status: Abnormal   Collection Time: 08/13/23  8:21 Samuel  Result Value Ref Range   IgG (Immunoglobin G), Serum 1,828 ([name]) 603 - 1,613 mg/dL   IgA 409 61 - 811 mg/dL   IgM (Immunoglobulin [name]), Srm 116 20 - 172 mg/dL   Total Protein ELP 7.3 6.0 - 8.5 g/dL   Albumin SerPl Elph-Mcnc 3.6 2.9 - 4.4 g/dL   Alpha 1 0.2 0.0 - 0.4 g/dL   Alpha2 Glob SerPl Elph-Mcnc 0.8 0.4 - 1.0 g/dL   [name]-Globulin SerPl Elph-Mcnc 1.0 0.7 - 1.3 g/dL   Gamma Glob SerPl Elph-Mcnc 1.6 0.4 - 1.8 g/dL   [name] Protein SerPl Elph-Mcnc 0.7 ([name]) Not Observed g/dL   Globulin, Total 3.7 2.2 - 3.9 g/dL   Albumin/Glob SerPl 1.0 0.7 - 1.7   IFE 1 Comment (A)     Comment: (NOTE) Immunofixation shows IgG monoclonal protein with kappa light chain specificity. PLEASE NOTE: Samples from patients receiving DARZALEX(R) (daratumumab) or SARCLISA(R)(isatuximab-irfc) treatment can appear as an "IgG kappa" and mask a complete response (CR). If this patient is receiving these therapies, this IFE assay interference can be removed by ordering test number 123218-"Immunofixation, Daratumumab-Specific, Serum" or 123062-"Immunofixation, Isatuximab-Specific, Serum" and submitting a new sample for testing or by calling the lab to add this test to the current sample. Polyclonal increase detected in one or more immunoglobulins.    Please Note Comment     Comment: (NOTE) Protein electrophoresis scan will follow via computer, mail, or courier delivery. Performed At: [facility] [address] [place], [place] [id number] Jolene Schimke MD OZ:[id number]   Kappa/lambda  light chains     Status: Abnormal   Collection Time: 08/13/23  8:21 Samuel  Result Value Ref Range   Kappa free light chain 38.2 ([name]) 3.3 - 19.4 mg/[name]   Lambda free light chains 16.1 5.7 - 26.3 mg/[name]   Kappa, lambda light chain ratio 2.37 ([name]) 0.26 - 1.65    Comment: (NOTE) Performed At: [facility] [address] [place], [place] [id number] Jolene Schimke MD KG:[id number]   Lactate dehydrogenase (LDH)     Status: Abnormal   Collection Time: 08/13/23  8:21 Samuel  Result Value Ref Range   LDH 279 ([name]) 98 - 192 U/[name]    Comment: Performed at [facility], [address] [place], [place] [address]  Kappa/lambda light chains     Status: Abnormal   Collection Time: 09/10/23  8:08 Samuel  Result Value Ref Range   Kappa free light chain 43.2 ([name]) 3.3 - 19.4 mg/[name]   Lambda free light chains 17.0 5.7 - 26.3 mg/[name]   Kappa, lambda light chain ratio 2.54 ([name]) 0.26 - 1.65    Comment: (NOTE) Performed At: [facility] [address] [place], [place] [id number] Jolene Schimke MD OV:[id number]   Lactate dehydrogenase (LDH)     Status: Abnormal   Collection Time:  09/10/23  8:08 Samuel  Result Value Ref Range   LDH 287 ([name]) 98 - 192 U/[name]    Comment: Performed at [facility], [address], [place], [place] [address]  CMP (Cancer Center only)     Status: Abnormal   Collection Time: 09/10/23  8:08 Samuel  Result Value Ref Range   Sodium 129 ([name]) 135 - 145 mmol/[name]   Potassium 4.3 3.5 - 5.1 mmol/[name]   Chloride 93 ([name]) 98 - 111 mmol/[name]   CO2 31 22 - 32 mmol/[name]   Glucose, Bld 94 70 - 99 mg/dL    Comment: Glucose reference range applies only to samples taken after fasting for at least 8 hours.   BUN 13 8 - 23 mg/dL   Creatinine 3.24 4.01 - 1.24 mg/dL   Calcium 9.8 8.9 - 02.7 mg/dL   Total Protein 8.6 ([name]) 6.5 - 8.1 g/dL   Albumin 4.3 3.5 - 5.0 g/dL   AST 29 15 - 41 U/[name]   ALT 21 0 - 44 U/[name]   Alkaline Phosphatase 78 38 - 126 U/[name]   Total Bilirubin 0.5 <1.2 mg/dL    GFR, Estimated >25 >36 mL/min    Comment: (NOTE) Calculated using the CKD-EPI Creatinine Equation (2021)    Anion gap 5 5 - 15    Comment: Performed at [facility], [address], [place], [place] [address]  CBC with Differential (Cancer Center Only)     Status: None   Collection Time: 09/10/23  8:08 Samuel  Result Value Ref Range   WBC Count 5.4 4.0 - 10.5 K/uL   RBC 5.12 4.22 - 5.81 MIL/uL   Hemoglobin 15.5 13.0 - 17.0 g/dL   HCT 47.4 25.9 - 56.3 %   MCV 88.9 80.0 - 100.0 fL   MCH 30.3 26.0 - 34.0 pg   MCHC 34.1 30.0 - 36.0 g/dL   RDW 87.5 64.3 - 32.9 %   Platelet Count 235 150 - 400 K/uL   nRBC 0.0 0.0 - 0.2 %   Neutrophils Relative % 68 %   Neutro Abs 3.7 1.7 - 7.7 K/uL   Lymphocytes Relative 20 %   Lymphs Abs 1.1 0.7 - 4.0 K/uL   Monocytes Relative 8 %   Monocytes Absolute 0.4 0.1 - 1.0 K/uL   Eosinophils Relative 4 %   Eosinophils Absolute 0.2 0.0 - 0.5 K/uL   Basophils Relative 0 %   Basophils Absolute 0.0 0.0 - 0.1 K/uL   Immature Granulocytes 0 %   Abs Immature Granulocytes 0.01 0.00 - 0.07 K/uL    Comment: Performed at [facility], [address], [place], [place] [address]         has a past medical history of Asthma, GERD (gastroesophageal reflux disease), High cholesterol, History of blood transfusion, Hypothyroidism, Interstitial lung disease (HCC), Multiple myeloma (HCC), Perennial allergic rhinitis, Pneumonia, Sciatic pain, right, Seasonal allergic rhinitis, and Thyroid disease.   reports that he quit smoking about 40 years ago. His smoking use included cigarettes. He started smoking about 43 years ago. He has a 0.3 pack-year smoking history. He has never used smokeless tobacco.  Past Surgical History:  Procedure Laterality Date   BRONCHIAL BIOPSY  06/18/2021   Procedure: BRONCHIAL BIOPSIES;  Surgeon: Leslye Peer, MD;  Location: [facility];  Service: Cardiopulmonary;;   BRONCHIAL BIOPSY  08/28/2021    Procedure: BRONCHIAL BIOPSIES;  Surgeon: Josephine Igo, DO;  Location: [facility];  Service: Cardiopulmonary;;  BRONCHIAL WASHINGS  06/18/2021   Procedure: BRONCHIAL WASHINGS;  Surgeon: Leslye Peer, MD;  Location: [facility];  Service: Cardiopulmonary;;   BRONCHIAL WASHINGS  08/28/2021   Procedure: BRONCHIAL WASHINGS;  Surgeon: Josephine Igo, DO;  Location: [facility];  Service: Cardiopulmonary;;   NASAL SINUS SURGERY     NECK SURGERY  1996   VIDEO BRONCHOSCOPY [name]/A 06/18/2021   Procedure: VIDEO BRONCHOSCOPY WITH FLUORO;  Surgeon: Leslye Peer, MD;  Location: [facility];  Service: Cardiopulmonary;  Laterality: [name]/A;   VIDEO BRONCHOSCOPY [name]/A 08/28/2021   Procedure: VIDEO BRONCHOSCOPY WITH FLUORO;  Surgeon: Josephine Igo, DO;  Location: [facility];  Service: Cardiopulmonary;  Laterality: [name]/A;    Allergies  Allergen Reactions   Clarithromycin Other (See Comments)    Hiccups   Ofev [Nintedanib] Other (See Comments)    GI bleeding   Penicillins Other (See Comments)    Child hood unsure    Immunization History  Administered Date([name]) Administered   Fluad Quad(high Dose 65+) 08/09/2021, 08/01/2022   Influenza Split 10/27/2017   Influenza, High Dose Seasonal PF 12/01/2017, 11/29/2019, 12/19/2020, 12/24/2021   Influenza, Quadrivalent, Recombinant, Inj, Pf 07/28/2019, 08/11/2020   Influenza-Unspecified 11/26/2011, 07/21/2017   PFIZER(Purple Top)SARS-COV-2 Vaccination 12/27/2019, 01/24/2020, 09/21/2020   Tdap 08/20/2005, 09/07/2015   Zoster, Live 11/06/2017, 05/07/2018    Family History  Problem Relation Age of Onset   Hypertension Mother    Allergies Mother    Allergies Father    CAD Father 63   Breast cancer Paternal Grandmother    Hypertension Other    Heart disease Other      Current Outpatient Medications:    acyclovir (ZOVIRAX) 400 MG tablet, Take 1 tablet (400 mg total) by mouth 2 (two) times daily., Disp: 60 tablet, Rfl: 3   albuterol (PROVENTIL) (2.5  MG/3ML) 0.083% nebulizer solution, Take 3 mLs (2.5 mg total) by nebulization every 6 (six) hours as needed for wheezing or shortness of breath., Disp: 120 mL, Rfl: 12   albuterol (VENTOLIN HFA) 108 (90 Base) MCG/ACT inhaler, Inhale 2 puffs into the lungs every 6 hours as needed for wheezing or shortness of breath., Disp: 8.5 g, Rfl: 1   ALPRAZolam (XANAX) 0.5 MG tablet, Take 0.5 mg by mouth at bedtime., Disp: , Rfl:    ARMOUR THYROID 90 MG tablet, Take 90 mg by mouth daily., Disp: , Rfl:    aspirin EC 81 MG tablet, Take 1 tablet (81 mg total) by mouth daily. Swallow whole., Disp: 90 tablet, Rfl: 3   ASSESS FULL RANGE PEAK METER DEVI, as directed., Disp: , Rfl:    azelastine (ASTELIN) 0.1 % nasal spray, Place 2 sprays into both nostrils 2 (two) times daily. Use in each nostril as directed, Disp: 30 mL, Rfl: 5   bisoprolol (ZEBETA) 5 MG tablet, Take 1 tablet (5 mg total) by mouth daily., Disp: 30 tablet, Rfl: 2   Budeson-Glycopyrrol-Formoterol (BREZTRI AEROSPHERE) 160-9-4.8 MCG/ACT AERO, Inhale 2 puffs into the lungs in the morning and at bedtime., Disp: 10.7 g, Rfl: 5   cholecalciferol (VITAMIN D) 1000 UNITS tablet, Take 3,000 Units by mouth daily., Disp: , Rfl:    Dextromethorphan HBr (DELSYM PO), Take by mouth as needed. For cough, Disp: , Rfl:    dextromethorphan-guaiFENesin (MUCINEX DM) 30-600 MG 12hr tablet, Take 1 tablet by mouth 2 (two) times daily., Disp: , Rfl:    EPINEPHrine (EPI-PEN) 0.3 mg/0.3 mL DEVI, Inject 0.3 mg into the muscle as needed., Disp: , Rfl:    ezetimibe-simvastatin (VYTORIN)  10-40 MG tablet, Take 1 tablet by mouth daily., Disp: 90 tablet, Rfl: 3   fluticasone (FLONASE) 50 MCG/ACT nasal spray, 1 spray., Disp: , Rfl:    Guaifenesin 1200 MG TB12, 1 tablet as needed Orally every 12 hrs for 5 days, Disp: , Rfl:    hydrOXYzine (ATARAX) 25 MG tablet, Take 25 mg by mouth every 8 (eight) hours as needed., Disp: , Rfl:    ibuprofen (ADVIL) 200 MG tablet, Take 200 mg by mouth every 6  (six) hours as needed., Disp: , Rfl:    ketoconazole (NIZORAL) 2 % cream, SMARTSIG:1 Application Topical 1 to 2 Times Daily, Disp: , Rfl:    levothyroxine (SYNTHROID) 50 MCG tablet, Take 50 mcg by mouth daily before breakfast., Disp: , Rfl:    loratadine (CLARITIN) 10 MG tablet, Take 10 mg by mouth daily., Disp: , Rfl:    montelukast (SINGULAIR) 10 MG tablet, Take 10 mg by mouth at bedtime., Disp: , Rfl:    Multiple Vitamin (MULTIVITAMIN) tablet, Take 1 tablet by mouth daily., Disp: , Rfl:    omeprazole (PRILOSEC) 40 MG capsule, Take 1 capsule (40 mg total) by mouth 2 (two) times daily. Take 30 minutes before a meal, Disp: 60 capsule, Rfl: 3   OXYGEN, Inhale 2 [name] into the lungs continuous. As needed, Disp: , Rfl:    predniSONE (DELTASONE) 10 MG tablet, 4 x 2 days, 2 x 2 days, 1 x 2 days, 1/2 x 2 days, then stop, Disp: 15 tablet, Rfl: 0   sildenafil (REVATIO) 20 MG tablet, Take 20 mg by mouth daily as needed (ED)., Disp: , Rfl:    Simethicone (SIMETHICONE ULTRA STRENGTH) 180 MG CAPS, Take 1 capsule (180 mg total) by mouth 3 (three) times daily as needed., Disp: 90 capsule, Rfl: 0   sodium chloride (OCEAN) 0.65 % SOLN nasal spray, Place 1 spray into both nostrils as needed for congestion., Disp: , Rfl:    tamsulosin (FLOMAX) 0.4 MG CAPS capsule, Take 0.4 mg by mouth daily., Disp: , Rfl:    triamcinolone cream (KENALOG) 0.1 %, Apply 1 application topically daily as needed (sun burn itch)., Disp: , Rfl:    Turmeric 400 MG CAPS, 1 capsule Orally once a day, Disp: , Rfl:    UNABLE TO FIND, Med Name: Allergy injections once weekly, Disp: , Rfl:       Objective:   Vitals:   09/12/23 0844  BP: 116/64  Pulse: 62  Temp: 97.7 F (36.5 C)  TempSrc: Oral  SpO2: 94%  Weight: 201 lb (91.2 kg)  Height: 5\' 10"  (1.778 [name])    Estimated body mass index is 28.84 kg/[name] as calculated from the following:   Height as of this encounter: 5\' 10"  (1.778 [name]).   Weight as of this encounter: 201 lb (91.2  kg).  @WEIGHTCHANGE @  [employer]   09/12/23 0844  Weight: 201 lb (91.2 kg)     Physical Exam   General: No distress. Looks same. Some  O2 at rest: none Cane present: no Sitting in wheel chair: no Frail: no Obese: no Neuro: Alert and Oriented x 3. GCS 15. Speech normal Psych: Pleasant Resp:  Barrel Chest - no.  Wheeze - no, Crackles - UL, No overt respiratory distress CVS: Normal heart sounds. Murmurs - no Ext: Stigmata of Connective Tissue Disease - no HEENT: Normal upper airway. PEERL +. No post nasal drip. MILD BLEPHARITIS LEFT EYE LID        Assessment:  ICD-10-CM   1. ILD (interstitial lung disease) (HCC)  J84.9     2. 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  He is dropping pulse ox 8% for the much shorter distance.  Do not know if you are dealing with ILD flareup or cardiac congestion or just reactive airway disease.  Nevertheless I would deem this is a significant deterioration.  Will expand/extend his prednisone taper.  Will add Z-Pak.  Will also rule out blood clot and check BNP.  He and his wife have been told that they will have to follow the results on MyChart and if it is abnormal call the answering service.  This because today is Friday.    Plan:     Patient Instructions  ILD (interstitial lung disease) (HCC) Drug-induced pneumonitis Cough and congestion/ASthma/SEasonal allergies =  - RAST panel positive for mild IgE elevation and dust mite allergy    - Unclear what is causing your decompensation.  Unclear if it is worsening ILD or reactive airway disease  Plan -Do blood work today for D-dimer and BNP - STAT 09/12/2023  -Results might not be available till after the weekend  -Call the paging service or answering service tonight or tomorrow for results  -Take Z-Pak -Change of prednisone taper as follows  - Please take Take prednisone 40mg  once daily x 3 days, then 30mg   once daily x 3 days, then 20mg  once daily x 3 days, then prednisone 10mg  once daily  x 3 days and go to baseline dose  -Get high-resolution CT chest supine and prone in the next few weeks to a few days    Myeloma    [name] protein < 0.8 in July 2024 and  on .7 I Oct 2024  on monitoring plan with consideration of Daratumumab   Plan  - per [facility] and Dr Elise Benne -Video visit or face-to-face nurse practitioner within the next few weeks to discuss results   FOLLOWUP Return in about 2 weeks (around 09/26/2023) for with any of the APPS.    SIGNATURE    Dr. Kalman Shan, [name].D., F.C.C.P,  Pulmonary and Critical Care Medicine Staff Physician, [name] Center Director - Interstitial Lung Disease  Program  Pulmonary Fibrosis [facility] [place], [place], [address]  Pager: [phone], If no answer or between  15:00h - 7:00h: call [phone] Telephone: [phone]  9:30 Samuel 09/12/2023

## 2023-09-14 LAB — MULTIPLE MYELOMA PANEL, SERUM
Albumin SerPl Elph-Mcnc: 3.7 g/dL (ref 2.9–4.4)
Albumin/Glob SerPl: 0.9 (ref 0.7–1.7)
Alpha 1: 0.3 g/dL (ref 0.0–0.4)
Alpha2 Glob SerPl Elph-Mcnc: 1 g/dL (ref 0.4–1.0)
B-Globulin SerPl Elph-Mcnc: 1.3 g/dL (ref 0.7–1.3)
Gamma Glob SerPl Elph-Mcnc: 1.9 g/dL — ABNORMAL HIGH (ref 0.4–1.8)
Globulin, Total: 4.4 g/dL — ABNORMAL HIGH (ref 2.2–3.9)
IgA: 298 mg/dL (ref 61–437)
IgG (Immunoglobin G), Serum: 1907 mg/dL — ABNORMAL HIGH (ref 603–1613)
IgM (Immunoglobulin M), Srm: 125 mg/dL (ref 20–172)
M Protein SerPl Elph-Mcnc: 0.7 g/dL — ABNORMAL HIGH
Total Protein ELP: 8.1 g/dL (ref 6.0–8.5)

## 2023-09-15 NOTE — Telephone Encounter (Signed)
Noted  

## 2023-09-24 ENCOUNTER — Ambulatory Visit: Payer: Medicare Other | Attending: Family Medicine

## 2023-09-24 DIAGNOSIS — R498 Other voice and resonance disorders: Secondary | ICD-10-CM | POA: Diagnosis present

## 2023-09-24 NOTE — Therapy (Signed)
OUTPATIENT SPEECH LANGUAGE PATHOLOGY VOICE TREATMENT (PROGRESS NOTE)   Patient Name: Samuel [name] MRN: [id number] DOB:1955-12-06, 67 y.o., male Today's Date: 09/24/2023  PCP: Samuel Joe, MD REFERRING PROVIDER: Tally Joe, MD  END OF SESSION:  End of Session - 09/24/23 1029     Visit Number 20    Number of Visits 23    Date for SLP Re-Evaluation 11/03/23    Authorization Type medicare/ BCBS    SLP Start Time 0930    SLP Stop Time  1018    SLP Time Calculation (min) 48 min    Activity Tolerance Patient tolerated treatment well             Speech Therapy Progress Note  Dates of Reporting Period: 07/02/23 to 09/24/23  Objective Reports of Subjective Statement: Reports overall improvement of voice quality and reduction of chronic cough/throat clearing behaviors. Endorses positive changes since beginning IMST exercises.   Objective Measurements: Pt exhibits occasional throat clearing, but successfully demonstrates cough suppression techniques and abdominal breathing pattern. Achieves clear and strong voicing in unstructured conversation, however is limited d/t poor respiratory support. He has increased MIP significantly over 2 weeks of completing IMST HEP.   Goal Update: see goals below  Plan: continue per POC  Reason Skilled Services are Required: Pt continues to benefit from skilled ST intervention to maximize vocal quality and respiratory support at the discourse level and within community settings.     Past Medical History:  Diagnosis Date   Asthma    GERD (gastroesophageal reflux disease)    High cholesterol    under control.    History of blood transfusion    Hypothyroidism    Interstitial lung disease (HCC)    Multiple myeloma (HCC)    Perennial allergic rhinitis    Pneumonia    Sciatic pain, right    Seasonal allergic rhinitis    Thyroid disease    Past Surgical History:  Procedure Laterality Date   BRONCHIAL BIOPSY  06/18/2021   Procedure:  BRONCHIAL BIOPSIES;  Surgeon: Samuel Peer, MD;  Location: [facility];  Service: Cardiopulmonary;;   BRONCHIAL BIOPSY  08/28/2021   Procedure: BRONCHIAL BIOPSIES;  Surgeon: Samuel Igo, DO;  Location: [facility];  Service: Cardiopulmonary;;   BRONCHIAL WASHINGS  06/18/2021   Procedure: BRONCHIAL WASHINGS;  Surgeon: Samuel Peer, MD;  Location: [facility];  Service: Cardiopulmonary;;   BRONCHIAL WASHINGS  08/28/2021   Procedure: BRONCHIAL WASHINGS;  Surgeon: Samuel Igo, DO;  Location: [facility];  Service: Cardiopulmonary;;   NASAL SINUS SURGERY     NECK SURGERY  1996   VIDEO BRONCHOSCOPY N/A 06/18/2021   Procedure: VIDEO BRONCHOSCOPY WITH FLUORO;  Surgeon: Samuel Peer, MD;  Location: [facility];  Service: Cardiopulmonary;  Laterality: N/A;   VIDEO BRONCHOSCOPY N/A 08/28/2021   Procedure: VIDEO BRONCHOSCOPY WITH FLUORO;  Surgeon: Samuel Igo, DO;  Location: [facility];  Service: Cardiopulmonary;  Laterality: N/A;   Patient Active Problem List   Diagnosis Date Noted   Essential hypertension 07/04/2023   Coronary artery disease 11/28/2022   Mixed hyperlipidemia 11/28/2022   Recurrent sinusitis 11/04/2022   Acute bacterial rhinosinusitis 09/26/2022   Chronic respiratory failure with hypoxia (HCC) 08/01/2022   S/P bronchoscopy    ILD (interstitial lung disease) (HCC)    Preop cardiovascular exam 08/21/2021   Pneumonitis 07/20/2021   Anxiety 07/20/2021   Interstitial pulmonary disease (HCC) 07/20/2021   Abnormal CT of the chest 06/18/2021   Dyspnea 06/15/2021   Hypoxia 06/15/2021  Multiple myeloma (HCC) 03/05/2021   Multiple myeloma not having achieved remission (HCC) 03/05/2021   Bone lesion 02/14/2021   Monoclonal gammopathy 02/14/2021   High cholesterol    Allergic rhinitis 11/23/2020   Mild persistent asthma 11/23/2020   SOB (shortness of breath) 10/04/2011    Onset date: 05/15/23 (referral date)  REFERRING DIAG: R49.0 (ICD-10-CM) -  Dysphonia  THERAPY DIAG: Other voice and resonance disorders  Rationale for Evaluation and Treatment: Rehabilitation  SUBJECTIVE:   SUBJECTIVE STATEMENT: "I have been doing it everyday" re: IMST HEP Accompanied by: self  PERTINENT HISTORY: "Samuel [name] is a 66 y.o. male who presents as a return patient, for follow-up of sinusitis and globus sensation. At time of last visit on 03/14/2023, nasal culture was obtained following thick mucoid drainage noted in the right nasal cavity. This was abnormal, and demonstrated gram-positive bacilli. Patient was subsequently treated with a 2-week course of oral antibiotics, and presents today for posttreatment imaging. He has continued to use proton pump inhibitor twice daily, but continues to experience frequent throat clearing and globus sensation. He states he has been working on dietary and lifestyle modifications as previously instructed, and has been partially successful. He still has difficulty with frequent throat clearing and needing to cough often. He also has not been able to obtain humidification for his oxygen, as the DME company stated they needed a prescription.  Last visit 03/14/2023: Samuel [name] is a 67 y.o. male who presents as a new consult, referred by Samuel Ingles, FNP , for evaluation and treatment of sinus issues, hoarseness, and throat symptoms. He is accompanied by his wife.  Patient had history of multiple myeloma, status post treatment with chemotherapy. He developed interstitial lung disease following chemotherapeutic treatment, and is currently followed closely by pulmonology. He also endorses history of significant environmental allergies, and reports that recent allergy testing showed positivity to "everything". He was previously on immunotherapy, but this was held during his cancer treatment. He is considering starting back on the chemotherapy due to the significance of his symptoms. He is currently using Singulair,  antihistamine and Patanase nasal spray on a daily basis. He also reports using nasal saline irrigations on a daily basis. He reports history of deviated nasal septum repair in 1983. He denies history of sinusitis requiring treatment with oral antibiotics in the last 12 months. His primary symptoms are nasal congestion and postnasal drainage. He denies facial pressure, pain, hyposmia, hypogeusia. He has not had any imaging of his head or sinuses. He does occasionally experience headaches. He uses oxygen on a nightly basis, and states that his oxygen is currently not humidified.  He reports symptoms of silent reflux, and states he is currently taking omeprazole on a daily basis. [name] is frequent throat clearing and globus sensation. He also reports occasional hoarseness."  PAIN: Are you having pain? No  FALLS: Has patient fallen in last 6 months? No  LIVING ENVIRONMENT: Lives with: lives with their family Lives in: House/apartment  PLOF:Level of assistance: Independent with ADLs, Independent with IADLs Employment: Full-time employment  PATIENT GOALS: "control my breath, control my speech, and relaxation"   OBJECTIVE:   PATIENT REPORTED OUTCOME MEASURES (PROM): Communication Participation Item Bank: 9 "quite a bit" for all situations  but "very much" for getting turn in fast moving conversation  TODAY'S TREATMENT:  09-24-23: Visited ED on 09/12/23 d/t shortness of breath. Began course of antibiotics for suspected bronchitis/pneumonia and symptoms have resolved. Despite recent respiratory challenges, pt reports wanting to continue IMST exercises as he has been completing daily HEP and feels it is significantly improving respiratory strength. Additionally, a friend provided positive affirmation of voice strength and quality over 2 hour conversation. In previous  session, SLP set device to 26.25 cm H20 based on MIP calculation and instructed pt not to adjust it, however the device was set to 33 cm H20 when pt entered. Reassessed MIP today across 3 trials: 80, 58, 75. Adjusted IMST device to 75% of MIP (80): 60 cm H20 (SLP set to max setting: 41 cm H20). Pt demo'd 25 reps with rare min A. Reviewed reflux precautions as pt endorses increased globus sensation/congestion in throat. Pt would like f/u with ENT for second opinion; requested today. Recommended inquiring about pulmonary rehab at upcoming appointment d/t success with IMST thus far. Re-assess MIP next session.   09-08-23: Continues to c/o significant asthma impacting respiratory function and subsequently voice. Initiated education and instruction of Administrator, arts (IMST) to address impaired breath support impacting vocal intensity and clarity. Measured MIP across 3 trials: 35, 29, 17. Modified provided IMST device to 75% of MIP (35): 26.25 cm H2O. Instructed patient on how to use device and perform recommended exercises (see pt instructions for HEP). Pt able to demo 25 reps with occasional min A. Current device setting was deemed appropriate. Re-assess MIP next session.   08-26-23: Reports feeling under the weather during the past week and experiencing congestion from asthma today. SLP led discussion about preparing for work meetings occurring after ST. Recommended taking 5 to 10 minutes before meetings to reset breathing and prepare for prolonged speech. Pt identified x2 strategies to focus on during meeting with mod I. In 30 minute unstructured conversation, pt experienced occasional wet vocal quality, likely due to congestion, but independently self-corrected. Otherwise maintained strong breath support and clear vocal quality with rare min A and x3 cough/throat clears. Pt endorses overall improvements in conversational speech, but wants to continue targeting respiratory support.    08-12-23: Reports ongoing improvements in voice as well as breathing with less frequent cues provided by wife to use strategies. Continue to target use of diaphragmatic breathing, intentional upper body relaxation, and forward projection of voice in dual tasking scenarios. Pt aware of increased tension and reduced abdominal breathing during transition periods that include dividing attention between talking and other task (walking, answering phone). Focused on being intentional with relaxation and breath support to improve vocal quality and intensity during targeted dual tasking activities. Occasional min cues required to reduce tension, optimize breathing pattern, and project voice during these tasks.   08-06-23: Entered with hoarse vocal quality that improved independently, with pt stating that he focused on using forward resonance. Maintained relaxed appearance, strong breath support, and clear vocal quality during 20-minute unstructured conversation. SLP directed pt to walk down the hall and return to therapy room to read a short passage x2 to target increased breath support and clear vocal quality in speech following exertion. Successfully completed task with occasional fading to rare min A, with improved breath support and vocal quality noted in the 2nd attempt. Benefited from cues to reset before beginning to read and take intentional pauses between sentences. Pt self-rated first attempt a 7.5 and second attempt an 8.5-9 out of 10. Addressed breath support during exertion by completing walking and talking task. Pt  maintained structured conversation while completing laps around therapy gym with occasional mod fading to min A to take breaths in between sentences and stay relaxed. Increased success noted in additional trial.   07-29-23: Reported ongoing HEP completion. Endorsed overall improvement of SOVT exercises, although some days are better than others. Targeted SOVTE to reduce vocal tension and  improve clarity. Completed sustained exhale, hum, and glides into water with occasional min A to reduce upper body tension and take reset breaths. Introduced straw talking exercise with short phrases to improve forward resonance. Used negative practice to alternate between back-focused voice and forward resonance to improve awareness of vocal quality. Accurately read aloud a list of 8-9 word sentences using forward resonance with occasional min A. Back-focused voice more apparent at the end of sentences. Benefited from cues to break up long sentences with a pause and reset with a straw or hum.  07-23-23: Entered with clear vocal quality and reported completing SOVT HEP intermittently. Targeted SOVT exercises to reduce tension in vocal folds and improve vocal quality. Aware he can sustain exhale longer than before during exercises (averaged 6 sec for breath only). Completed hum, accents, and happy birthday into water given occasional fading to rare min A to relax upper body tension and increase breath support. Maintained 15 minute conversation with clear vocal quality and minimal upper body tension independently. Pt endorsed increasing awareness of body tension in the community; however, his wife occasionally cues him to relax which causes frustration. SLP counseled pt that the strategies he is learning will take time to master in the community. No coughing or throat clearing observed throughout session.    PATIENT EDUCATION: Education details: see above  Person educated: Patient and Spouse Education method: Medical illustrator Education comprehension: verbalized understanding, returned demonstration, and needs further education  HOME EXERCISE PROGRAM: Abdominal breathing, forward resonance  GOALS: Goals reviewed with patient? Yes  SHORT TERM GOALS: Target date: 06/23/2023  Pt will reduce throat clearing and coughing by 50% at STG date using trained techniques Baseline: > 9, 6 respectively;  1/0 at STG date Goal status: MET  2.  Pt will employ abdominal breathing during structured tasks with 80% accuracy given occasional min A  Baseline:  Goal status: MET  3.  Pt will employ abdominal breathing during structured conversation with 80% accuracy given occasional min A Baseline:  Goal status: MET  4.  Pt will maintain clear vocal quality in 5-10 minute structured conversations x2 given occasional min A  Baseline:  Goal status: MET    LONG TERM GOALS: Target date: 07/21/2023 (11/03/2023 for recert)   Pt will reduce throat clearing and coughing by 75% at LTG date using trained techniques Baseline:  Goal status: MET  2.  Pt will employ abdominal breathing during unstructured conversation with 80% accuracy given rare min A Baseline:  Goal status: MET  3.  Pt will maintain clear vocal quality in 15+ minute unstructured conversations x2 given rare min A  Baseline: 07-23-23, 08-06-23 Goal status: MET   4.  Pt will carryover trained voice techniques during community outings x2 with less frequent cues required by wife  Baseline: 08-12-23, 09-24-23 Goal status: MET  5.  Pt will complete IMST HEP with rare min A resulting in increased MIP at 4 weeks Baseline: MIP=26.25 Goal status: IN PROGRESS (at recert)  6.  Pt will report improved communication effectiveness via PROM by 2 pts at last ST session  Baseline: CPIB=9 Goal status: IN PROGRESS (at recert)  ASSESSMENT:  CLINICAL IMPRESSION: Patient is a 67 y.o. M who was seen today for dysphonia. PMHX significant for Multiple Myeloma, interstitial lung disease, and GERD. Conducted ongoing education of IMST to optimize breath support and vocal quality. Reviewed reflux recommendations and throat clear alternatives d/t pt report of increased globus sensation/throat congestion. Pt continuing to benefit from skilled ST intervention, such as training vocal hygiene protocol and voice techniques to optimize vocal quality and communication  effectiveness.   OBJECTIVE IMPAIRMENTS: include voice disorder. These impairments are limiting patient from effectively communicating at home and in community. Factors affecting potential to achieve goals and functional outcome are co-morbidities. Patient will benefit from skilled SLP services to address above impairments and improve overall function.  REHAB POTENTIAL: Good  PLAN:  SLP FREQUENCY: 2x/week (decrease to every other week)  SLP DURATION: 8 weeks (+ 8 additional weeks for recert)  PLANNED INTERVENTIONS: Language facilitation, Environmental controls, Cueing hierachy, Internal/external aids, Functional tasks, Multimodal communication approach, SLP instruction and feedback, Compensatory strategies, Patient/family education, and Re-evaluation    Gracy Racer, CCC-SLP 09/24/2023, 10:59 AM

## 2023-09-24 NOTE — Patient Instructions (Addendum)
WHEN DO I TAKE REFLUXRAFT? Take 1 teaspoon (5ml), or more as needed or as directed by your healthcare practitioner, after meals and/or before bed. May repeat after meals throughout the day as needed. It can also be taken after alcohol, which can be a particularly potent stimulator of acid reflux.  IS IT OK TO DRINK WATER OR ANY OTHER FLUIDS AFTER TAKING REFLUXRAFT? Yes, you can drink water after taking RefluxRaft but it will temporarily displace the raft formed by the alginate. However, the protective raft will shortly float back to the top of the stomach contents and provide the protective benefit and relief of reflux symptoms.  CAN I TAKE REFLUXRAFT IF I AM ON PROTON PUMP INHIBITORS (PPIS)? Yes. However, RefluxRaft requires some acid in the stomach to activate the raft, so it may be less effective if also taking a high-dose PPI at the same time.  HOW OFTEN CAN I TAKE REFLUXRAFT? RefluxRaft is a natural supplement that can be taken as often as you like, but we recommend after meals (particularly larger, spicy, meals) and at bedtimes for a total of 4 times per day. It can also be taken after alcohol, which can be a particularly potent stimulator of acid reflux.  LONG LASTING RELIEF The "raft" lasts about 4 hours, providing lasting relief so you can finally enjoy meals and get a good night's sleep.   For additional FAQ, visit this link: [email or web address]  For home page of website, visit this link: [email or web address]

## 2023-09-25 ENCOUNTER — Telehealth: Payer: Self-pay | Admitting: Otolaryngology

## 2023-09-25 NOTE — Telephone Encounter (Signed)
Called and left vmail to sch apt with Dr Irene Pap, provider had message Dr Irene Pap and said we could sch him within the next two week.

## 2023-09-26 ENCOUNTER — Encounter: Payer: Self-pay | Admitting: Adult Health

## 2023-09-26 ENCOUNTER — Telehealth: Payer: Medicare Other | Admitting: Adult Health

## 2023-09-26 ENCOUNTER — Telehealth: Payer: Self-pay | Admitting: Adult Health

## 2023-09-26 DIAGNOSIS — J849 Interstitial pulmonary disease, unspecified: Secondary | ICD-10-CM

## 2023-09-26 DIAGNOSIS — J4531 Mild persistent asthma with (acute) exacerbation: Secondary | ICD-10-CM | POA: Diagnosis not present

## 2023-09-26 DIAGNOSIS — J9611 Chronic respiratory failure with hypoxia: Secondary | ICD-10-CM

## 2023-09-26 NOTE — Assessment & Plan Note (Signed)
Continue on O2 with activity and At bedtime

## 2023-09-26 NOTE — Progress Notes (Signed)
Virtual Visit via Video Note  I connected with Samuel [name] on 09/26/23 at  3:00 PM EST by a video enabled telemedicine application and verified that I am speaking with the correct person using two identifiers.  Location: Patient: Home  Provider: Office    I discussed the limitations of evaluation and management by telemedicine and the availability of in person appointments. The patient expressed understanding and agreed to proceed.  History of Present Illness: 67 year old male former smoker followed for interstitial lung disease, drug-induced pneumonitis (multiple myeloma drug treatment) and chronic respiratory failure and asthma Medical history significant for multiple myeloma diagnosed in 2022 followed by oncology  Today's video visit is a 2 week  follow-up for interstitial lung disease, drug-induced pneumonitis, chronic respiratory failure and asthma.  Patient was seen last visit 2 weeks ago with acute symptoms of cough, congestion and shortness of breath.  Was treated for possible asthmatic bronchitic exacerbation with a Z-Pak and a prednisone taper.  Lab work showed normal BNP.   Flu and COVID was negative.  White blood cell count was normal D-dimer was minimally elevated.  CT chest was negative for PE.  Showed chronic interstitial lung disease with mildly increased groundglass opacities, unchanged lesions in the left sixth rib and the left inferior scapula.  Since last visit patient is feeling much improved cough and congestion have decreased.  Shortness of breath has improved.  Patient is trying to get back to being more active is currently walking on the treadmill.  Has also been doing some breathing exercises.  Has been working with speech therapy.  We discussed his test results in detail.  Baseline get winded with activity.  We discussed pulmonary rehab, very interested in this.  Has a few days of prednisone left. Remains on Breztri Twice daily. Remains on O2 2l/m with activity and At  bedtime    Past Medical History:  Diagnosis Date   Asthma    GERD (gastroesophageal reflux disease)    High cholesterol    under control.    History of blood transfusion    Hypothyroidism    Interstitial lung disease (HCC)    Multiple myeloma (HCC)    Perennial allergic rhinitis    Pneumonia    Sciatic pain, right    Seasonal allergic rhinitis    Thyroid disease    Current Outpatient Medications on File Prior to Visit  Medication Sig Dispense Refill   acyclovir (ZOVIRAX) 400 MG tablet Take 1 tablet (400 mg total) by mouth 2 (two) times daily. 60 tablet 3   albuterol (PROVENTIL) (2.5 MG/3ML) 0.083% nebulizer solution USE ONE VIAL IN NEBULIZER EVERY 6 HOURS AS NEEDED WHEEZING OR SHORTNESS OF BREATH 120 mL 12   albuterol (VENTOLIN HFA) 108 (90 Base) MCG/ACT inhaler Inhale 2 puffs into the lungs every 6 hours as needed for wheezing or shortness of breath. 8.5 g 1   ALPRAZolam (XANAX) 0.5 MG tablet Take 0.5 mg by mouth at bedtime.     ARMOUR THYROID 90 MG tablet Take 90 mg by mouth daily.     aspirin EC 81 MG tablet Take 1 tablet (81 mg total) by mouth daily. Swallow whole. 90 tablet 3   ASSESS FULL RANGE PEAK METER DEVI as directed.     azelastine (ASTELIN) 0.1 % nasal spray Place 2 sprays into both nostrils 2 (two) times daily. Use in each nostril as directed 30 mL 5   azithromycin (ZITHROMAX) 250 MG tablet Take 2 tablets on day one, then one tablet,  days 2-5. 6 tablet 0   bisoprolol (ZEBETA) 5 MG tablet Take 1 tablet (5 mg total) by mouth daily. 30 tablet 2   Budeson-Glycopyrrol-Formoterol (BREZTRI AEROSPHERE) 160-9-4.8 MCG/ACT AERO Inhale 2 puffs into the lungs in the morning and at bedtime. 10.7 g 5   cholecalciferol (VITAMIN D) 1000 UNITS tablet Take 3,000 Units by mouth daily.     Dextromethorphan HBr (DELSYM PO) Take by mouth as needed. For cough     dextromethorphan-guaiFENesin (MUCINEX DM) 30-600 MG 12hr tablet Take 1 tablet by mouth 2 (two) times daily.     EPINEPHrine  (EPI-PEN) 0.3 mg/0.3 mL DEVI Inject 0.3 mg into the muscle as needed.     ezetimibe-simvastatin (VYTORIN) 10-40 MG tablet Take 1 tablet by mouth daily. 90 tablet 3   fluticasone (FLONASE) 50 MCG/ACT nasal spray 1 spray.     Guaifenesin 1200 MG TB12 1 tablet as needed Orally every 12 hrs for 5 days     hydrOXYzine (ATARAX) 25 MG tablet Take 25 mg by mouth every 8 (eight) hours as needed.     ibuprofen (ADVIL) 200 MG tablet Take 200 mg by mouth every 6 (six) hours as needed.     ketoconazole (NIZORAL) 2 % cream SMARTSIG:1 Application Topical 1 to 2 Times Daily     levothyroxine (SYNTHROID) 50 MCG tablet Take 50 mcg by mouth daily before breakfast.     loratadine (CLARITIN) 10 MG tablet Take 10 mg by mouth daily.     montelukast (SINGULAIR) 10 MG tablet Take 10 mg by mouth at bedtime.     Multiple Vitamin (MULTIVITAMIN) tablet Take 1 tablet by mouth daily.     omeprazole (PRILOSEC) 40 MG capsule Take 1 capsule (40 mg total) by mouth 2 (two) times daily. Take 30 minutes before a meal 60 capsule 3   OXYGEN Inhale 2 L into the lungs continuous. As needed     predniSONE (DELTASONE) 10 MG tablet 4 x 2 days, 2 x 2 days, 1 x 2 days, 1/2 x 2 days, then stop 15 tablet 0   predniSONE (DELTASONE) 10 MG tablet Take 40 mg once daily x 3 days, then 30 mg once daily x 3 days, then 20 mg once daily x 3 days, then 10 mg once daily x 3 days and go to baseline dose. 30 tablet 0   sildenafil (REVATIO) 20 MG tablet Take 20 mg by mouth daily as needed (ED).     Simethicone (SIMETHICONE ULTRA STRENGTH) 180 MG CAPS Take 1 capsule (180 mg total) by mouth 3 (three) times daily as needed. 90 capsule 0   sodium chloride (OCEAN) 0.65 % SOLN nasal spray Place 1 spray into both nostrils as needed for congestion.     tamsulosin (FLOMAX) 0.4 MG CAPS capsule Take 0.4 mg by mouth daily.     triamcinolone cream (KENALOG) 0.1 % Apply 1 application topically daily as needed (sun burn itch).     Turmeric 400 MG CAPS 1 capsule Orally once  a day     UNABLE TO FIND Med Name: Allergy injections once weekly     No current facility-administered medications on file prior to visit.         Observations/Objective: 06/15/2021 PFT FVC 33, FEV1 37, ratio 83, TLC 59, DLCOcor 46.  No BD 06/16/2021 echo: EF 65-70%, LVH. RV size and function nl 07/30/2021 HRCT chest:Mediastinal lymph nodes are decreased in size when compared to prior.  Central airways are patent.  Mild diffuse groundglass opacity with peribronchovascular and  subpleural reticular glass opacities as well as traction bronchiectasis.  No clear craniocaudal gradient.  There is mild bilateral air trapping.  There is possible honeycomb change of the anterior left upper lobe.  There is a stable solid right middle lobe pulmonary nodule measuring 3 mm.  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 10/16/2021 PFT: FVC 29, FEV1 36, ratio 92, DLCO corrected 37 12/10/2021 PFT: FVC 32, FEV1 40, ratio 94, DLCO corrected 34 01/06/2022 CTA chest: No evidence of PE.  There is cardiomegaly and a small pericardial effusion.  Lung volumes are small.  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  No acute process noted. 06/06/2022 PFT: FVC 30, FEV1 36, ratio 88, DLCO corrected for alveolar volume 51 11/04/2022 PFT: FVC 28, FEV1 32, ratio 85, DLCOcor 37 High-resolution CT chest November 25, 2022 shows diffuse reticular opacities with associated traction bronchiectasis, no definite evidence of progression, previous diffuse groundglass opacities from October 2022 and August 2022 have resolved.  Findings are indeterminate for UIP   bronchoscopy performed in 06/18/2021.  Eosinophils 51%, lymphs 18%, no malignant cells identified patient was started on a long-term prednisone taper. -Significant eosinophilia in the BAL the bronc is suggestive of drug-induced pneumonitis.    Esbriet stopped in October 2023 due to inability to tolerate due to GI side effects     # IgG Kappa Multiple  Myeloma 02/22/2021: bone marrow biopsy confirms the diagnosis of Multiple Myeloma with a monoclonal plasma cell population.   Assessment and Plan: ILD /Hx of Drug induced Pneumonitis -recent flare-finish steroids. Refer to pulmonary rehab. Change HRCT for 3 months.   Asthma-recent flare now improving -finish steroids   O2 RF -continue on O2 2l/m with activity and At bedtime     Patient Instructions  Delsym 2 tsp Twice daily for cough As needed   Tessalon Three times a day  for cough As needed   Finish Prednisone as directed.  Continue on Breztri 2 puffs Twice daily  , rinse after use.  Albuterol inhaler /neb As needed   Continue on Singulair daily  Reschedule HRCT in 3 months.  Refer to pulmonary rehab.  Follow up with Dr. Marchelle Gearing in 3 months and As needed   Please contact office for sooner follow up if symptoms do not improve or worsen or seek emergency care       Follow Up Instructions:    I discussed the assessment and treatment plan with the patient. The patient was provided an opportunity to ask questions and all were answered. The patient agreed with the plan and demonstrated an understanding of the instructions.   The patient was advised to call back or seek an in-person evaluation if the symptoms worsen or if the condition fails to improve as anticipated.  I provided 30  minutes of non-face-to-face time during this encounter.   Rubye Oaks, NP

## 2023-09-26 NOTE — Patient Instructions (Addendum)
Delsym 2 tsp Twice daily for cough As needed   Tessalon Three times a day  for cough As needed   Finish Prednisone as directed.  Continue on Breztri 2 puffs Twice daily  , rinse after use.  Albuterol inhaler /neb As needed   Continue on Singulair daily  Reschedule HRCT in 3 months.  Refer to pulmonary rehab.  Follow up with Dr. Marchelle Gearing in 3 months and As needed   Please contact office for sooner follow up if symptoms do not improve or worsen or seek emergency care

## 2023-09-26 NOTE — Assessment & Plan Note (Signed)
Recent flare - resolving  Continue on current regimen

## 2023-09-26 NOTE — Telephone Encounter (Signed)
Order placed from [facility] , d/c HRCT chest for 09/30/23, new order placed for HRCT chest in March.  Also please refer to pulmonary rehab under Dr. Marchelle Gearing for diagnosis of ILD

## 2023-09-26 NOTE — Assessment & Plan Note (Signed)
ILD/hx of Drug induced pneumonitis - recent flare with bronchitis.  CT chest neg for PE. Showed mild increased GGO - d/c HRCT chest next week, move to 3 months  Finish steroids .  Refer to pulmonary rehab.   Plan  Patient Instructions  Delsym 2 tsp Twice daily for cough As needed   Tessalon Three times a day  for cough As needed   Finish Prednisone as directed.  Continue on Breztri 2 puffs Twice daily  , rinse after use.  Albuterol inhaler /neb As needed   Continue on Singulair daily  Reschedule HRCT in 3 months.  Refer to pulmonary rehab.  Follow up with Dr. Marchelle Gearing in 3 months and As needed   Please contact office for sooner follow up if symptoms do not improve or worsen or seek emergency care

## 2023-09-29 NOTE — Telephone Encounter (Signed)
Pulmonary rehab order has been placed.  Nothing further needed.

## 2023-09-30 ENCOUNTER — Inpatient Hospital Stay: Admission: RE | Admit: 2023-09-30 | Payer: Medicare Other | Source: Ambulatory Visit

## 2023-10-01 ENCOUNTER — Institutional Professional Consult (permissible substitution) (INDEPENDENT_AMBULATORY_CARE_PROVIDER_SITE_OTHER): Payer: Medicare Other | Admitting: Otolaryngology

## 2023-10-02 ENCOUNTER — Telehealth (HOSPITAL_COMMUNITY): Payer: Self-pay

## 2023-10-02 NOTE — Telephone Encounter (Signed)
Pt insurance is active and benefits verified through [employer]. Co-pay $0.00, DED $240.00/$240.00 met, out of pocket $0.00/$0.00 met, co-insurance 20%. No pre-authorization required. 10/02/23 @ 3:37PM   2ndary insurance is active and benefits verified through [employer]. Co-pay $0.00, DED $0.00/$0.00 met, out of pocket $0.00/$0.00 met, co-insurance 0%. No pre-authorization required. 10/02/23 @ 3:37PM

## 2023-10-02 NOTE — Telephone Encounter (Signed)
Pt stated he does not know the days he will be doing S.T, adv pt he will not be able to attend S.T and P.R on the same days. Patient verbalized understanding.

## 2023-10-02 NOTE — Telephone Encounter (Signed)
Called patient to see if he was interested in participating in the Pulmonary Rehab Program. Patient stated yes. Patient will come in for orientation on 10/06/23 @ 9:00AM and will attend the 8:15AM exercise class.   Pensions consultant.

## 2023-10-06 ENCOUNTER — Encounter (HOSPITAL_COMMUNITY)
Admission: RE | Admit: 2023-10-06 | Discharge: 2023-10-06 | Disposition: A | Discharge status: Home / Self Care | Payer: Medicare Other | Source: Ambulatory Visit | Attending: Internal Medicine | Admitting: Internal Medicine

## 2023-10-06 ENCOUNTER — Encounter (HOSPITAL_COMMUNITY): Payer: Self-pay

## 2023-10-06 VITALS — BP 122/78 | HR 64 | Wt 207.9 lb

## 2023-10-06 DIAGNOSIS — J849 Interstitial pulmonary disease, unspecified: Secondary | ICD-10-CM | POA: Insufficient documentation

## 2023-10-06 NOTE — Progress Notes (Signed)
Pulmonary Rehab Orientation Physical Assessment Note  Physical assessment reveals patient is alert and oriented x 4. Heart rate is normal, breath sounds crackles to auscultation. Pt denies chonic cough. Bowel sounds present x4 quads. Pt denies abdominal discomfort, nausea, vomiting, diarrhea or constipation. Grip strength equal, strong. Distal pulses +3; no swelling to lower extremities.   Essie Hart, RN, BSN

## 2023-10-06 NOTE — Progress Notes (Signed)
Pulmonary Individual Treatment Plan  Patient Details  Name: Samuel [name] MRN: [id number] Date of Birth: April 08, 1956 Referring Provider:   Doristine Devoid Pulmonary Rehab Walk Test from 10/06/2023 in [facility]  Referring Provider Ramaswamy       Initial Encounter Date:  Flowsheet Row Pulmonary Rehab Walk Test from 10/06/2023 in [facility]  Date 10/06/23       Visit Diagnosis: ILD (interstitial lung disease) (HCC)  Patient's Home Medications on Admission:   Current Outpatient Medications:    acyclovir (ZOVIRAX) 400 MG tablet, Take 1 tablet (400 mg total) by mouth 2 (two) times daily., Disp: 60 tablet, Rfl: 3   albuterol (PROVENTIL) (2.5 MG/3ML) 0.083% nebulizer solution, USE ONE VIAL IN NEBULIZER EVERY 6 HOURS AS NEEDED WHEEZING OR SHORTNESS OF BREATH, Disp: 120 mL, Rfl: 12   albuterol (VENTOLIN HFA) 108 (90 Base) MCG/ACT inhaler, Inhale 2 puffs into the lungs every 6 hours as needed for wheezing or shortness of breath., Disp: 8.5 g, Rfl: 1   ALPRAZolam (XANAX) 0.5 MG tablet, Take 0.5 mg by mouth at bedtime., Disp: , Rfl:    ARMOUR THYROID 90 MG tablet, Take 90 mg by mouth daily., Disp: , Rfl:    ASSESS FULL RANGE PEAK METER DEVI, as directed., Disp: , Rfl:    azelastine (ASTELIN) 0.1 % nasal spray, Place 2 sprays into both nostrils 2 (two) times daily. Use in each nostril as directed, Disp: 30 mL, Rfl: 5   azithromycin (ZITHROMAX) 250 MG tablet, Take 2 tablets on day one, then one tablet, days 2-5., Disp: 6 tablet, Rfl: 0   bisoprolol (ZEBETA) 5 MG tablet, Take 1 tablet (5 mg total) by mouth daily., Disp: 30 tablet, Rfl: 2   Budeson-Glycopyrrol-Formoterol (BREZTRI AEROSPHERE) 160-9-4.8 MCG/ACT AERO, Inhale 2 puffs into the lungs in the morning and at bedtime., Disp: 10.7 g, Rfl: 5   cholecalciferol (VITAMIN D) 1000 UNITS tablet, Take 3,000 Units by mouth daily.,  Disp: , Rfl:    Dextromethorphan HBr (DELSYM PO), Take by mouth as needed. For cough, Disp: , Rfl:    dextromethorphan-guaiFENesin (MUCINEX DM) 30-600 MG 12hr tablet, Take 1 tablet by mouth 2 (two) times daily., Disp: , Rfl:    EPINEPHrine (EPI-PEN) 0.3 mg/0.3 mL DEVI, Inject 0.3 mg into the muscle as needed., Disp: , Rfl:    ezetimibe-simvastatin (VYTORIN) 10-40 MG tablet, Take 1 tablet by mouth daily., Disp: 90 tablet, Rfl: 3   fluticasone (FLONASE) 50 MCG/ACT nasal spray, 1 spray., Disp: , Rfl:    hydrOXYzine (ATARAX) 25 MG tablet, Take 25 mg by mouth every 8 (eight) hours as needed., Disp: , Rfl:    ibuprofen (ADVIL) 200 MG tablet, Take 200 mg by mouth every 6 (six) hours as needed., Disp: , Rfl:    ketoconazole (NIZORAL) 2 % cream, SMARTSIG:1 Application Topical 1 to 2 Times Daily, Disp: , Rfl:    levothyroxine (SYNTHROID) 50 MCG tablet, Take 50 mcg by mouth daily before breakfast., Disp: , Rfl:    loratadine (CLARITIN) 10 MG tablet, Take 10 mg by mouth daily., Disp: , Rfl:    montelukast (SINGULAIR) 10 MG tablet, Take 10 mg by mouth at bedtime., Disp: , Rfl:    Multiple Vitamin (MULTIVITAMIN) tablet, Take 1 tablet by mouth daily., Disp: , Rfl:    omeprazole (PRILOSEC) 40 MG capsule, Take 1 capsule (40 mg total) by mouth 2 (two) times daily. Take 30  minutes before a meal, Disp: 60 capsule, Rfl: 3   OXYGEN, Inhale 2 L into the lungs continuous. As needed, Disp: , Rfl:    sildenafil (REVATIO) 20 MG tablet, Take 20 mg by mouth daily as needed (ED)., Disp: , Rfl:    sodium chloride (OCEAN) 0.65 % SOLN nasal spray, Place 1 spray into both nostrils as needed for congestion., Disp: , Rfl:    tamsulosin (FLOMAX) 0.4 MG CAPS capsule, Take 0.4 mg by mouth daily., Disp: , Rfl:    triamcinolone cream (KENALOG) 0.1 %, Apply 1 application topically daily as needed (sun burn itch)., Disp: , Rfl:    Turmeric 400 MG CAPS, 1 capsule Orally once a day, Disp: , Rfl:    aspirin EC 81 MG tablet, Take 1 tablet (81  mg total) by mouth daily. Swallow whole. (Patient not taking: Reported on 10/06/2023), Disp: 90 tablet, Rfl: 3   Guaifenesin 1200 MG TB12, 1 tablet as needed Orally every 12 hrs for 5 days (Patient not taking: Reported on 10/06/2023), Disp: , Rfl:    predniSONE (DELTASONE) 10 MG tablet, 4 x 2 days, 2 x 2 days, 1 x 2 days, 1/2 x 2 days, then stop (Patient not taking: Reported on 10/06/2023), Disp: 15 tablet, Rfl: 0   predniSONE (DELTASONE) 10 MG tablet, Take 40 mg once daily x 3 days, then 30 mg once daily x 3 days, then 20 mg once daily x 3 days, then 10 mg once daily x 3 days and go to baseline dose. (Patient not taking: Reported on 10/06/2023), Disp: 30 tablet, Rfl: 0   Simethicone (SIMETHICONE ULTRA STRENGTH) 180 MG CAPS, Take 1 capsule (180 mg total) by mouth 3 (three) times daily as needed. (Patient not taking: Reported on 10/06/2023), Disp: 90 capsule, Rfl: 0   UNABLE TO FIND, Med Name: Allergy injections once weekly, Disp: , Rfl:   Past Medical History: Past Medical History:  Diagnosis Date   Asthma    GERD (gastroesophageal reflux disease)    High cholesterol    under control.    History of blood transfusion    Hypothyroidism    Interstitial lung disease (HCC)    Multiple myeloma (HCC)    Perennial allergic rhinitis    Pneumonia    Sciatic pain, right    Seasonal allergic rhinitis    Thyroid disease     Tobacco Use: Social History   Tobacco Use  Smoking Status Former   Current packs/day: 0.00   Average packs/day: 0.1 packs/day for 3.0 years (0.3 ttl pk-yrs)   Types: Cigarettes   Start date: 10/22/1979   Quit date: 10/21/1982   Years since quitting: 40.9  Smokeless Tobacco Never    Labs: Review Flowsheet       Latest Ref Rng & Units 06/15/2021 07/12/2021 09/27/2021  Labs for ITP Cardiac and Pulmonary Rehab  Hemoglobin A1c 4.6 - 6.5 % - 5.7  6.2   PH, Arterial 7.350 - 7.450 7.445  - -  PCO2 arterial 32.0 - 48.0 mmHg 34.0  - -  Bicarbonate 20.0 - 28.0 mmol/L 23.0  - -   O2 Saturation % 97.6  - -    Capillary Blood Glucose: No results found for: "GLUCAP"   Pulmonary Assessment Scores:  Pulmonary Assessment Scores     Row Name 10/06/23 1017         ADL UCSD   ADL Phase Entry     SOB Score total 63       CAT Score  CAT Score 19       mMRC Score   mMRC Score 4             UCSD: Self-administered rating of dyspnea associated with activities of daily living (ADLs) 6-point scale (0 = "not at all" to 5 = "maximal or unable to do because of breathlessness")  Scoring Scores range from 0 to 120.  Minimally important difference is 5 units  CAT: CAT can identify the health impairment of COPD patients and is better correlated with disease progression.  CAT has a scoring range of zero to 40. The CAT score is classified into four groups of low (less than 10), medium (10 - 20), high (21-30) and very high (31-40) based on the impact level of disease on health status. A CAT score over 10 suggests significant symptoms.  A worsening CAT score could be explained by an exacerbation, poor medication adherence, poor inhaler technique, or progression of COPD or comorbid conditions.  CAT MCID is 2 points  mMRC: mMRC (Modified Medical Research Council) Dyspnea Scale is used to assess the degree of baseline functional disability in patients of respiratory disease due to dyspnea. No minimal important difference is established. A decrease in score of 1 point or greater is considered a positive change.   Pulmonary Function Assessment:  Pulmonary Function Assessment - 10/06/23 0957       Breath   Bilateral Breath Sounds Rales    Shortness of Breath Yes;Limiting activity;Panic with Shortness of Breath;Fear of Shortness of Breath             Exercise Target Goals: Exercise Program Goal: Individual exercise prescription set using results from initial 6 min walk test and THRR while considering  patient's activity barriers and safety.   Exercise  Prescription Goal: Initial exercise prescription builds to 30-45 minutes a day of aerobic activity, 2-3 days per week.  Home exercise guidelines will be given to patient during program as part of exercise prescription that the participant will acknowledge.  Activity Barriers & Risk Stratification:  Activity Barriers & Cardiac Risk Stratification - 10/06/23 0951       Activity Barriers & Cardiac Risk Stratification   Activity Barriers Deconditioning;Muscular Weakness;Shortness of Breath    Cardiac Risk Stratification Moderate             6 Minute Walk:  6 Minute Walk     Row Name 10/06/23 1043         6 Minute Walk   Phase Initial     Distance 730 feet     Distance Feet Change 730 ft     Walk Time 6 minutes     # of Rest Breaks 0     MPH 1.38     METS 1.7     RPE 12     Perceived Dyspnea  1.5     VO2 Peak 5.97     Symptoms No     Resting HR 56 bpm     Resting Oxygen Saturation  95 %     Exercise Oxygen Saturation  during 6 min walk 89 %     Max Ex. HR 74 bpm     Max Ex. BP 128/70     2 Minute Post BP 112/68       Interval HR   1 Minute HR 68     2 Minute HR 72     3 Minute HR 74     4 Minute HR 74  5 Minute HR 74     6 Minute HR 74     2 Minute Post HR 57     Interval Heart Rate? Yes       Interval Oxygen   Interval Oxygen? Yes     Baseline Oxygen Saturation % 95 %     1 Minute Oxygen Saturation % 97 %     1 Minute Liters of Oxygen 2 L     2 Minute Oxygen Saturation % 96 %     2 Minute Liters of Oxygen 2 L     3 Minute Oxygen Saturation % 91 %     3 Minute Liters of Oxygen 2 L     4 Minute Oxygen Saturation % 89 %     4 Minute Liters of Oxygen 2 L     5 Minute Oxygen Saturation % 90 %     5 Minute Liters of Oxygen 2 L     6 Minute Oxygen Saturation % 90 %     6 Minute Liters of Oxygen 2 L     2 Minute Post Oxygen Saturation % 97 %     2 Minute Post Liters of Oxygen 2 L              Oxygen Initial Assessment:  Oxygen Initial Assessment -  10/06/23 0952       Home Oxygen   Home Oxygen Device Home Concentrator;E-Tanks;Portable Concentrator    Sleep Oxygen Prescription Continuous    Liters per minute 2    Home Exercise Oxygen Prescription Continuous    Liters per minute 2    Home Resting Oxygen Prescription Continuous    Liters per minute 2    Compliance with Home Oxygen Use Yes      Initial 6 min Walk   Oxygen Used Continuous    Liters per minute 2      Program Oxygen Prescription   Program Oxygen Prescription Continuous    Liters per minute 2      Intervention   Short Term Goals To learn and exhibit compliance with exercise, home and travel O2 prescription;To learn and understand importance of monitoring SPO2 with pulse oximeter and demonstrate accurate use of the pulse oximeter.;To learn and understand importance of maintaining oxygen saturations>88%;To learn and demonstrate proper pursed lip breathing techniques or other breathing techniques. ;To learn and demonstrate proper use of respiratory medications    Long  Term Goals Exhibits compliance with exercise, home  and travel O2 prescription;Maintenance of O2 saturations>88%;Compliance with respiratory medication;Verbalizes importance of monitoring SPO2 with pulse oximeter and return demonstration;Exhibits proper breathing techniques, such as pursed lip breathing or other method taught during program session;Demonstrates proper use of MDI's             Oxygen Re-Evaluation:   Oxygen Discharge (Final Oxygen Re-Evaluation):   Initial Exercise Prescription:  Initial Exercise Prescription - 10/06/23 1000       Date of Initial Exercise RX and Referring Provider   Date 10/06/23    Referring Provider Ramaswamy    Expected Discharge Date 01/01/23      Oxygen   Oxygen Continuous    Liters 2    Maintain Oxygen Saturation 88% or higher      Treadmill   MPH 2    Grade 1    Minutes 15      Recumbant Elliptical   Level 1    RPM 40    Watts 50    Minutes  15  Prescription Details   Frequency (times per week) 2    Duration Progress to 30 minutes of continuous aerobic without signs/symptoms of physical distress      Intensity   THRR 40-80% of Max Heartrate 61-122    Ratings of Perceived Exertion 11-13    Perceived Dyspnea 0-4      Progression   Progression Continue to progress workloads to maintain intensity without signs/symptoms of physical distress.      Resistance Training   Training Prescription Yes    Weight blue bands    Reps 10-15             Perform Capillary Blood Glucose checks as needed.  Exercise Prescription Changes:   Exercise Comments:   Exercise Goals and Review:   Exercise Goals     Row Name 10/06/23 0952             Exercise Goals   Increase Physical Activity Yes       Intervention Provide advice, education, support and counseling about physical activity/exercise needs.;Develop an individualized exercise prescription for aerobic and resistive training based on initial evaluation findings, risk stratification, comorbidities and participant's personal goals.       Expected Outcomes Short Term: Attend rehab on a regular basis to increase amount of physical activity.;Long Term: Add in home exercise to make exercise part of routine and to increase amount of physical activity.;Long Term: Exercising regularly at least 3-5 days a week.       Increase Strength and Stamina Yes       Intervention Provide advice, education, support and counseling about physical activity/exercise needs.;Develop an individualized exercise prescription for aerobic and resistive training based on initial evaluation findings, risk stratification, comorbidities and participant's personal goals.       Expected Outcomes Short Term: Increase workloads from initial exercise prescription for resistance, speed, and METs.;Short Term: Perform resistance training exercises routinely during rehab and add in resistance training at home;Long  Term: Improve cardiorespiratory fitness, muscular endurance and strength as measured by increased METs and functional capacity ( )       Able to understand and use rate of perceived exertion (RPE) scale Yes       Intervention Provide education and explanation on how to use RPE scale       Expected Outcomes Short Term: Able to use RPE daily in rehab to express subjective intensity level;Long Term:  Able to use RPE to guide intensity level when exercising independently       Able to understand and use Dyspnea scale Yes       Intervention Provide education and explanation on how to use Dyspnea scale       Expected Outcomes Short Term: Able to use Dyspnea scale daily in rehab to express subjective sense of shortness of breath during exertion;Long Term: Able to use Dyspnea scale to guide intensity level when exercising independently       Knowledge and understanding of Target Heart Rate Range (THRR) Yes       Intervention Provide education and explanation of THRR including how the numbers were predicted and where they are located for reference       Expected Outcomes Short Term: Able to state/look up THRR;Long Term: Able to use THRR to govern intensity when exercising independently;Short Term: Able to use daily as guideline for intensity in rehab       Understanding of Exercise Prescription Yes       Intervention Provide education, explanation, and written materials on patient's individual  exercise prescription       Expected Outcomes Short Term: Able to explain program exercise prescription;Long Term: Able to explain home exercise prescription to exercise independently                Exercise Goals Re-Evaluation :   Discharge Exercise Prescription (Final Exercise Prescription Changes):   Nutrition:  Target Goals: Understanding of nutrition guidelines, daily intake of sodium 1500mg , cholesterol 200mg , calories 30% from fat and 7% or less from saturated fats, daily to have 5 or more  servings of fruits and vegetables.  Biometrics:    Nutrition Therapy Plan and Nutrition Goals:   Nutrition Assessments:  MEDIFICTS Score Key: >=70 Need to make dietary changes  40-70 Heart Healthy Diet <= 40 Therapeutic Level Cholesterol Diet   Picture Your Plate Scores: <40 Unhealthy dietary pattern with much room for improvement. 41-50 Dietary pattern unlikely to meet recommendations for good health and room for improvement. 51-60 More healthful dietary pattern, with some room for improvement.  >60 Healthy dietary pattern, although there may be some specific behaviors that could be improved.    Nutrition Goals Re-Evaluation:   Nutrition Goals Discharge (Final Nutrition Goals Re-Evaluation):   Psychosocial: Target Goals: Acknowledge presence or absence of significant depression and/or stress, maximize coping skills, provide positive support system. Participant is able to verbalize types and ability to use techniques and skills needed for reducing stress and depression.  Initial Review & Psychosocial Screening:  Initial Psych Review & Screening - 10/06/23 0949       Initial Review   Current issues with Current Anxiety/Panic;Current Psychotropic Meds;Current Stress Concerns    Source of Stress Concerns Chronic Illness;Unable to participate in former interests or hobbies    Comments Pt is stressed about his health. He also admits to having days of sadness because he is not able to do activities such as hiking anymore. He has good support from his family and friends.      Family Dynamics   Good Support System? Yes      Barriers   Psychosocial barriers to participate in program The patient should benefit from training in stress management and relaxation.;Psychosocial barriers identified (see note)      Screening Interventions   Interventions Encouraged to exercise    Expected Outcomes Long Term Goal: Stressors or current issues are controlled or eliminated.;Short Term  goal: Utilizing psychosocial counselor, staff and physician to assist with identification of specific Stressors or current issues interfering with healing process. Setting desired goal for each stressor or current issue identified.             Quality of Life Scores:  Scores of 19 and below usually indicate a poorer quality of life in these areas.  A difference of  2-3 points is a clinically meaningful difference.  A difference of 2-3 points in the total score of the Quality of Life Index has been associated with significant improvement in overall quality of life, self-image, physical symptoms, and general health in studies assessing change in quality of life.  PHQ-9: Review Flowsheet       10/06/2023  Depression screen PHQ 2/9  Decreased Interest 1  Down, Depressed, Hopeless 1  PHQ - 2 Score 2  Altered sleeping 0  Tired, decreased energy 0  Change in appetite 0  Feeling bad or failure about yourself  1  Trouble concentrating 0  Moving slowly or fidgety/restless 1  Suicidal thoughts 0  PHQ-9 Score 4  Difficult doing work/chores Somewhat difficult  Interpretation of Total Score  Total Score Depression Severity:  1-4 = Minimal depression, 5-9 = Mild depression, 10-14 = Moderate depression, 15-19 = Moderately severe depression, 20-27 = Severe depression   Psychosocial Evaluation and Intervention:  Psychosocial Evaluation - 10/06/23 0950       Psychosocial Evaluation & Interventions   Interventions Encouraged to exercise with the program and follow exercise prescription;Stress management education    Comments [name] admits to having stress due to his current health issues. He also admits to being "a little down". some days due to not being able to do certain activities anymore.  He is currently taking psychostropic meds. He denies needing a referral for a therapist at this time.    Expected Outcomes For [name] to have less stress throughout the program    Continue Psychosocial  Services  No Follow up required             Psychosocial Re-Evaluation:   Psychosocial Discharge (Final Psychosocial Re-Evaluation):   Education: Education Goals: Education classes will be provided on a weekly basis, covering required topics. Participant will state understanding/return demonstration of topics presented.  Learning Barriers/Preferences:  Learning Barriers/Preferences - 10/06/23 0950       Learning Barriers/Preferences   Learning Barriers Sight    Learning Preferences Individual Instruction;Written Material             Education Topics: Know Your Numbers Group instruction that is supported by a PowerPoint presentation. Instructor discusses importance of knowing and understanding resting, exercise, and post-exercise oxygen saturation, heart rate, and blood pressure. Oxygen saturation, heart rate, blood pressure, rating of perceived exertion, and dyspnea are reviewed along with a normal range for these values.    Exercise for the Pulmonary Patient Group instruction that is supported by a PowerPoint presentation. Instructor discusses benefits of exercise, core components of exercise, frequency, duration, and intensity of an exercise routine, importance of utilizing pulse oximetry during exercise, safety while exercising, and options of places to exercise outside of rehab.    MET Level  Group instruction provided by PowerPoint, verbal discussion, and written material to support subject matter. Instructor reviews what METs are and how to increase METs.    Pulmonary Medications Verbally interactive group education provided by instructor with focus on inhaled medications and proper administration.   Anatomy and Physiology of the Respiratory System Group instruction provided by PowerPoint, verbal discussion, and written material to support subject matter. Instructor reviews respiratory cycle and anatomical components of the respiratory system and their  functions. Instructor also reviews differences in obstructive and restrictive respiratory diseases with examples of each.    Oxygen Safety Group instruction provided by PowerPoint, verbal discussion, and written material to support subject matter. There is an overview of "What is Oxygen" and "Why do we need it".  Instructor also reviews how to create a safe environment for oxygen use, the importance of using oxygen as prescribed, and the risks of noncompliance. There is a brief discussion on traveling with oxygen and resources the patient may utilize.   Oxygen Use Group instruction provided by PowerPoint, verbal discussion, and written material to discuss how supplemental oxygen is prescribed and different types of oxygen supply systems. Resources for more information are provided.    Breathing Techniques Group instruction that is supported by demonstration and informational handouts. Instructor discusses the benefits of pursed lip and diaphragmatic breathing and detailed demonstration on how to perform both.     Risk Factor Reduction Group instruction that is supported by a PowerPoint presentation.  Instructor discusses the definition of a risk factor, different risk factors for pulmonary disease, and how the heart and lungs work together.   Pulmonary Diseases Group instruction provided by PowerPoint, verbal discussion, and written material to support subject matter. Instructor gives an overview of the different type of pulmonary diseases. There is also a discussion on risk factors and symptoms as well as ways to manage the diseases.   Stress and Energy Conservation Group instruction provided by PowerPoint, verbal discussion, and written material to support subject matter. Instructor gives an overview of stress and the impact it can have on the body. Instructor also reviews ways to reduce stress. There is also a discussion on energy conservation and ways to conserve energy throughout the  day.   Warning Signs and Symptoms Group instruction provided by PowerPoint, verbal discussion, and written material to support subject matter. Instructor reviews warning signs and symptoms of stroke, heart attack, cold and flu. Instructor also reviews ways to prevent the spread of infection.   Other Education Group or individual verbal, written, or video instructions that support the educational goals of the pulmonary rehab program.    Knowledge Questionnaire Score:  Knowledge Questionnaire Score - 10/06/23 1019       Knowledge Questionnaire Score   Pre Score 17/18             Core Components/Risk Factors/Patient Goals at Admission:  Personal Goals and Risk Factors at Admission - 10/06/23 0951       Core Components/Risk Factors/Patient Goals on Admission   Improve shortness of breath with ADL's Yes    Intervention Provide education, individualized exercise plan and daily activity instruction to help decrease symptoms of SOB with activities of daily living.    Expected Outcomes Short Term: Improve cardiorespiratory fitness to achieve a reduction of symptoms when performing ADLs;Long Term: Be able to perform more ADLs without symptoms or delay the onset of symptoms    Increase knowledge of respiratory medications and ability to use respiratory devices properly  Yes    Intervention Provide education and demonstration as needed of appropriate use of medications, inhalers, and oxygen therapy.    Expected Outcomes Short Term: Achieves understanding of medications use. Understands that oxygen is a medication prescribed by physician. Demonstrates appropriate use of inhaler and oxygen therapy.;Long Term: Maintain appropriate use of medications, inhalers, and oxygen therapy.    Stress Yes    Intervention Offer individual and/or small group education and counseling on adjustment to heart disease, stress management and health-related lifestyle change. Teach and support self-help  strategies.;Refer participants experiencing significant psychosocial distress to appropriate mental health specialists for further evaluation and treatment. When possible, include family members and significant others in education/counseling sessions.    Expected Outcomes Short Term: Participant demonstrates changes in health-related behavior, relaxation and other stress management skills, ability to obtain effective social support, and compliance with psychotropic medications if prescribed.;Long Term: Emotional wellbeing is indicated by absence of clinically significant psychosocial distress or social isolation.             Core Components/Risk Factors/Patient Goals Review:    Core Components/Risk Factors/Patient Goals at Discharge (Final Review):    ITP Comments:   Comments: Dr. Mechele Collin is Medical Director for Pulmonary Rehab at [facility].

## 2023-10-06 NOTE — Progress Notes (Signed)
Samuel [name] 67 y.o. male Pulmonary Rehab Orientation Note This patient who was referred to Pulmonary Rehab by Dr. Marchelle Gearing with the diagnosis of ILD arrived today in Cardiac and Pulmonary Rehab. He arrived ambulatory with normal gait. He does carry portable oxygen. [employer] is the provider for their DME. Per patient, Samuel [name] uses oxygen continuously. Color good, skin warm and dry. Patient is oriented to time and place. Patient's medical history, psychosocial health, and medications reviewed. Psychosocial assessment reveals patient lives with spouse. 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 Patient hobbies include reading and playing golf . Patient reports his stress level is moderate. Areas of stress/anxiety include health. Patient does exhibit signs of depression. Signs of depression include anxiety and sadness. PHQ2/9 score 2/4. Samuel [name] shows good  coping skills with positive outlook on life. Offered emotional support and reassurance. Will continue to monitor. Physical assessment performed by Nurse pick: Essie Hart RN. Please see their orientation physical assessment note. Samuel [name] reports he  does take medications as prescribed. Patient states he  follows a regular  diet. The patient reports no specific efforts to gain or lose weight.. Patient's weight will be monitored closely. Demonstration and practice of PLB using pulse oximeter. Samuel [name] able to return demonstration satisfactorily. Safety and hand hygiene in the exercise area reviewed with patient. Samuel [name] voices understanding of the information reviewed. Department expectations discussed with patient and achievable goals were set. The patient shows enthusiasm about attending the program and we look forward to working with Samuel [name]. Samuel [name] completed a 6 min walk test today and is scheduled to begin exercise on 10/14/23 @0815 .   [id number] Samuel [name], BSRT

## 2023-10-07 ENCOUNTER — Encounter (INDEPENDENT_AMBULATORY_CARE_PROVIDER_SITE_OTHER): Payer: Self-pay | Admitting: Otolaryngology

## 2023-10-07 ENCOUNTER — Ambulatory Visit (INDEPENDENT_AMBULATORY_CARE_PROVIDER_SITE_OTHER): Payer: Medicare Other | Admitting: Otolaryngology

## 2023-10-07 ENCOUNTER — Other Ambulatory Visit (HOSPITAL_COMMUNITY): Payer: Self-pay | Admitting: Otolaryngology

## 2023-10-07 VITALS — BP 129/77 | HR 72 | Ht 70.0 in

## 2023-10-07 DIAGNOSIS — K219 Gastro-esophageal reflux disease without esophagitis: Secondary | ICD-10-CM | POA: Diagnosis not present

## 2023-10-07 DIAGNOSIS — J383 Other diseases of vocal cords: Secondary | ICD-10-CM

## 2023-10-07 DIAGNOSIS — J3089 Other allergic rhinitis: Secondary | ICD-10-CM

## 2023-10-07 DIAGNOSIS — R053 Chronic cough: Secondary | ICD-10-CM

## 2023-10-07 DIAGNOSIS — R059 Cough, unspecified: Secondary | ICD-10-CM

## 2023-10-07 DIAGNOSIS — R490 Dysphonia: Secondary | ICD-10-CM

## 2023-10-07 DIAGNOSIS — Z9981 Dependence on supplemental oxygen: Secondary | ICD-10-CM

## 2023-10-07 DIAGNOSIS — R1310 Dysphagia, unspecified: Secondary | ICD-10-CM

## 2023-10-07 DIAGNOSIS — R0981 Nasal congestion: Secondary | ICD-10-CM

## 2023-10-07 DIAGNOSIS — R0982 Postnasal drip: Secondary | ICD-10-CM

## 2023-10-07 DIAGNOSIS — R0989 Other specified symptoms and signs involving the circulatory and respiratory systems: Secondary | ICD-10-CM

## 2023-10-07 DIAGNOSIS — Z8709 Personal history of other diseases of the respiratory system: Secondary | ICD-10-CM

## 2023-10-07 MED ORDER — AZELASTINE HCL 0.1 % NA SOLN: 2.0000 | Freq: Two times a day (BID) | NASAL | 12 refills | Status: DC

## 2023-10-07 MED ORDER — CETIRIZINE HCL 10 MG PO TABS: 10.0000 mg | ORAL_TABLET | Freq: Every day | ORAL | 11 refills | Status: DC

## 2023-10-07 NOTE — Progress Notes (Signed)
ENT CONSULT:  Reason for Consult: dysphonia chronic cough and throat clearing    HPI: Discussed the use of AI scribe software for clinical note transcription with the patient, who gave verbal consent to proceed.  History of Present Illness   The patient is 67 yoM  with hx of interstitial lung disease, presents with chronic cough and sensation of mucus in the throat, which have been ongoing for several years and have worsened with the progression of their lung condition. The patient reports a history of multiple myeloma treatment, which led to the diagnosis of interstitial lung disease in September 2022 which was thought to be caused by medication side effect. The patient denies any history of stroke or heart attack.  The patient uses oxygen intermittently, during exercise, at night during sleep, and occasionally while watching TV. They recently experienced an exacerbation of their lung disease, which was managed with steroids. The patient also reports a history of environmental allergies and was on allergy shots for approximately ten years, which they felt were beneficial. However, they discontinued the shots due to their overall health condition.  The patient has been managing their nasal drainage with saline rinses daily, azelastine, and Flonase, usually once in the morning. They also take Claritin almost daily. For reflux, they take omeprazole. They take mucinex as needed, and recently started Reflux Raft about a month ago. The patient also occasionally uses Delsym for cough.  The patient reports occasional difficulty swallowing, but denies any specific food causing more problems. They also report occasional coughing or choking with liquids. The patient has noticed voice changes, which seem to worsen towards the end of the day. They have been seeing a speech therapist for these issues and have one session remaining.   The patient has been experiencing these symptoms for several years, but they  have been exacerbated by their lung condition.   Records Reviewed:  Video Visit with Pulm 09/26/23  67 year old male former smoker followed for interstitial lung disease, drug-induced pneumonitis (multiple myeloma drug treatment) and chronic respiratory failure and asthma Medical history significant for multiple myeloma diagnosed in 2022 followed by oncology   Today's video visit is a 2 week  follow-up for interstitial lung disease, drug-induced pneumonitis, chronic respiratory failure and asthma.  Patient was seen last visit 2 weeks ago with acute symptoms of cough, congestion and shortness of breath.  Was treated for possible asthmatic bronchitic exacerbation with a Z-Pak and a prednisone taper.  Lab work showed normal BNP.   Flu and COVID was negative.  White blood cell count was normal D-dimer was minimally elevated.  CT chest was negative for PE.  Showed chronic interstitial lung disease with mildly increased groundglass opacities, unchanged lesions in the left sixth rib and the left inferior scapula.  Since last visit patient is feeling much improved cough and congestion have decreased.  Shortness of breath has improved.  Patient is trying to get back to being more active is currently walking on the treadmill.  Has also been doing some breathing exercises.  Has been working with speech therapy.  We discussed his test results in detail.  Baseline get winded with activity.  We discussed pulmonary rehab, very interested in this.  Has a few days of prednisone left. Remains on Breztri Twice daily. Remains on O2 2l/m with activity and At bedtime    ILD /Hx of Drug induced Pneumonitis -recent flare-finish steroids. Refer to pulmonary rehab. Change HRCT for 3 months.    Asthma-recent flare now improving -finish  steroids    O2 RF -continue on O2 2l/m with activity and At bedtime  SLP notes (multiple)  Reports overall improvement of voice quality and reduction of chronic cough/throat clearing  behaviors. Endorses positive changes since beginning IMST exercises.    Objective Measurements: Pt exhibits occasional throat clearing, but successfully demonstrates cough suppression techniques and abdominal breathing pattern. Achieves clear and strong voicing in unstructured conversation, however is limited d/t poor respiratory support. He has increased MIP significantly over 2 weeks of completing IMST HEP.  Samuel [name] is a 67 y.o. male who presents as a return patient, for follow-up of sinusitis and globus sensation. At time of last visit on 03/14/2023, nasal culture was obtained following thick mucoid drainage noted in the right nasal cavity. This was abnormal, and demonstrated gram-positive bacilli. Patient was subsequently treated with a 2-week course of oral antibiotics, and presents today for posttreatment imaging. He has continued to use proton pump inhibitor twice daily, but continues to experience frequent throat clearing and globus sensation. He states he has been working on dietary and lifestyle modifications as previously instructed, and has been partially successful. He still has difficulty with frequent throat clearing and needing to cough often. He also has not been able to obtain humidification for his oxygen, as the DME company stated they needed a prescription.    Last visit 03/14/2023: Javarous Matulewicz is a 67 y.o. male who presents as a new consult, referred by [name], FNP , for evaluation and treatment of sinus issues, hoarseness, and throat symptoms. He is accompanied by his wife.  Patient had history of multiple myeloma, status post treatment with chemotherapy. He developed interstitial lung disease following chemotherapeutic treatment, and is currently followed closely by pulmonology. He also endorses history of significant environmental allergies, and reports that recent allergy testing showed positivity to "everything". He was previously on immunotherapy, but this  was held during his cancer treatment. He is considering starting back on the chemotherapy due to the significance of his symptoms. He is currently using Singulair, antihistamine and Patanase nasal spray on a daily basis. He also reports using nasal saline irrigations on a daily basis. He reports history of deviated nasal septum repair in 1983. He denies history of sinusitis requiring treatment with oral antibiotics in the last 12 months. His primary symptoms are nasal congestion and postnasal drainage. He denies facial pressure, pain, hyposmia, hypogeusia. He has not had any imaging of his head or sinuses. He does occasionally experience headaches. He uses oxygen on a nightly basis, and states that his oxygen is currently not humidified.  He reports symptoms of silent reflux, and states he is currently taking omeprazole on a daily basis. [name] is frequent throat clearing and globus sensation. He also reports occasional hoarseness."   A/P 09-24-23: Visited ED on 09/12/23 d/t shortness of breath. Began course of antibiotics for suspected bronchitis/pneumonia and symptoms have resolved. Despite recent respiratory challenges, pt reports wanting to continue IMST exercises as he has been completing daily HEP and feels it is significantly improving respiratory strength. Additionally, a friend provided positive affirmation of voice strength and quality over 2 hour conversation. In previous session, SLP set device to 26.25 cm H20 based on MIP calculation and instructed pt not to adjust it, however the device was set to 33 cm H20 when pt entered. Reassessed MIP today across 3 trials: 80, 58, 75. Adjusted IMST device to 75% of MIP (80): 60 cm H20 (SLP set to max setting: 41 cm H20). Pt demo'd  25 reps with rare min A. Reviewed reflux precautions as pt endorses increased globus sensation/congestion in throat. Pt would like f/u with ENT for second opinion; requested today. Recommended inquiring about pulmonary rehab at  upcoming appointment d/t success with IMST thus far. Re-assess MIP next session.    09-08-23: Continues to c/o significant asthma impacting respiratory function and subsequently voice. Initiated education and instruction of Administrator, arts (IMST) to address impaired breath support impacting vocal intensity and clarity. Measured MIP across 3 trials: 35, 29, 17. Modified provided IMST device to 75% of MIP (35): 26.25 cm H2O. Instructed patient on how to use device and perform recommended exercises (see pt instructions for HEP). Pt able to demo 25 reps with occasional min A. Current device setting was deemed appropriate. Re-assess MIP next session.    08-26-23: Reports feeling under the weather during the past week and experiencing congestion from asthma today. SLP led discussion about preparing for work meetings occurring after ST. Recommended taking 5 to 10 minutes before meetings to reset breathing and prepare for prolonged speech. Pt identified x2 strategies to focus on during meeting with mod I. In 30 minute unstructured conversation, pt experienced occasional wet vocal quality, likely due to congestion, but independently self-corrected. Otherwise maintained strong breath support and clear vocal quality with rare min A and x3 cough/throat clears. Pt endorses overall improvements in conversational speech, but wants to continue targeting respiratory support.    08-12-23: Reports ongoing improvements in voice as well as breathing with less frequent cues provided by wife to use strategies. Continue to target use of diaphragmatic breathing, intentional upper body relaxation, and forward projection of voice in dual tasking scenarios. Pt aware of increased tension and reduced abdominal breathing during transition periods that include dividing attention between talking and other task (walking, answering phone). Focused on being intentional with relaxation and breath support to improve vocal  quality and intensity during targeted dual tasking activities. Occasional min cues required to reduce tension, optimize breathing pattern, and project voice during these tasks.    08-06-23: Entered with hoarse vocal quality that improved independently, with pt stating that he focused on using forward resonance. Maintained relaxed appearance, strong breath support, and clear vocal quality during 20-minute unstructured conversation. SLP directed pt to walk down the hall and return to therapy room to read a short passage x2 to target increased breath support and clear vocal quality in speech following exertion. Successfully completed task with occasional fading to rare min A, with improved breath support and vocal quality noted in the 2nd attempt. Benefited from cues to reset before beginning to read and take intentional pauses between sentences. Pt self-rated first attempt a 7.5 and second attempt an 8.5-9 out of 10. Addressed breath support during exertion by completing walking and talking task. Pt maintained structured conversation while completing laps around therapy gym with occasional mod fading to min A to take breaths in between sentences and stay relaxed. Increased success noted in additional trial.    07-29-23: Reported ongoing HEP completion. Endorsed overall improvement of SOVT exercises, although some days are better than others. Targeted SOVTE to reduce vocal tension and improve clarity. Completed sustained exhale, hum, and glides into water with occasional min A to reduce upper body tension and take reset breaths. Introduced straw talking exercise with short phrases to improve forward resonance. Used negative practice to alternate between back-focused voice and forward resonance to improve awareness of vocal quality. Accurately read aloud a list of 8-9 word sentences using  forward resonance with occasional min A. Back-focused voice more apparent at the end of sentences. Benefited from cues to break up  long sentences with a pause and reset with a straw or hum.   07-23-23: Entered with clear vocal quality and reported completing SOVT HEP intermittently. Targeted SOVT exercises to reduce tension in vocal folds and improve vocal quality. Aware he can sustain exhale longer than before during exercises (averaged 6 sec for breath only). Completed hum, accents, and happy birthday into water given occasional fading to rare min A to relax upper body tension and increase breath support. Maintained 15 minute conversation with clear vocal quality and minimal upper body tension independently. Pt endorsed increasing awareness of body tension in the community; however, his wife occasionally cues him to relax which causes frustration. SLP counseled pt that the strategies he is learning will take time to master in the community. No coughing or throat clearing observed throughout session.  Past Medical History:  Diagnosis Date   Asthma    GERD (gastroesophageal reflux disease)    High cholesterol    under control.    History of blood transfusion    Hypothyroidism    Interstitial lung disease (HCC)    Multiple myeloma (HCC)    Perennial allergic rhinitis    Pneumonia    Sciatic pain, right    Seasonal allergic rhinitis    Thyroid disease     Past Surgical History:  Procedure Laterality Date   BRONCHIAL BIOPSY  06/18/2021   Procedure: BRONCHIAL BIOPSIES;  Surgeon: Leslye Peer, MD;  Location: [facility];  Service: Cardiopulmonary;;   BRONCHIAL BIOPSY  08/28/2021   Procedure: BRONCHIAL BIOPSIES;  Surgeon: Josephine Igo, DO;  Location: [facility];  Service: Cardiopulmonary;;   BRONCHIAL WASHINGS  06/18/2021   Procedure: BRONCHIAL WASHINGS;  Surgeon: Leslye Peer, MD;  Location: [facility];  Service: Cardiopulmonary;;   BRONCHIAL WASHINGS  08/28/2021   Procedure: BRONCHIAL WASHINGS;  Surgeon: Josephine Igo, DO;  Location: [facility];  Service: Cardiopulmonary;;   NASAL SINUS SURGERY     NECK  SURGERY  1996   VIDEO BRONCHOSCOPY N/A 06/18/2021   Procedure: VIDEO BRONCHOSCOPY WITH FLUORO;  Surgeon: Leslye Peer, MD;  Location: [facility];  Service: Cardiopulmonary;  Laterality: N/A;   VIDEO BRONCHOSCOPY N/A 08/28/2021   Procedure: VIDEO BRONCHOSCOPY WITH FLUORO;  Surgeon: Josephine Igo, DO;  Location: [facility];  Service: Cardiopulmonary;  Laterality: N/A;    Family History  Problem Relation Age of Onset   Hypertension Mother    Allergies Mother    Allergies Father    CAD Father 22   Breast cancer Paternal Grandmother    Hypertension Other    Heart disease Other     Social History:  reports that he quit smoking about 40 years ago. His smoking use included cigarettes. He started smoking about 43 years ago. He has a 0.3 pack-year smoking history. He has never used smokeless tobacco. He reports that he does not currently use alcohol. He reports that he does not use drugs.  Allergies:  Allergies  Allergen Reactions   Clarithromycin Other (See Comments)    Hiccups   Ofev [Nintedanib] Other (See Comments)    GI bleeding   Penicillins Other (See Comments)    Child hood unsure    Medications: I have reviewed the patient's current medications.  The PMH, PSH, Medications, Allergies, and SH were reviewed and updated.  ROS: Constitutional: Negative for fever, weight loss and weight gain. Cardiovascular:  Negative for chest pain and dyspnea on exertion. Respiratory: Is not experiencing shortness of breath at rest. Gastrointestinal: Negative for nausea and vomiting. Neurological: Negative for headaches. Psychiatric: The patient is not nervous/anxious  Blood pressure 129/77, pulse 72, height 5\' 10"  (1.778 m), SpO2 91%.  PHYSICAL EXAM:  Exam: General: Well-developed, well-nourished Communication and Voice: Clear pitch and clarity Respiratory Respiratory effort: Equal inspiration and expiration without stridor Cardiovascular Peripheral Vascular: Warm extremities  with equal color/perfusion Eyes: No nystagmus with equal extraocular motion bilaterally Neuro/Psych/Balance: Patient oriented to person, place, and time; Appropriate mood and affect; Gait is intact with no imbalance; Cranial nerves I-XII are intact Head and Face Inspection: Normocephalic and atraumatic without mass or lesion Palpation: Facial skeleton intact without bony stepoffs Salivary Glands: No mass or tenderness Facial Strength: Facial motility symmetric and full bilaterally ENT Pinna: External ear intact and fully developed External canal: Canal is patent with intact skin Tympanic Membrane: Clear and mobile External Nose: No scar or anatomic deformity Internal Nose: Septum is relatively straight. No polyp, or purulence. Mucosal edema and erythema present.  Bilateral inferior turbinate hypertrophy. S Lips, Teeth, and gums: Mucosa and teeth intact and viable TMJ: No pain to palpation with full mobility Oral cavity/oropharynx: No erythema or exudate, no lesions present Nasopharynx: No mass or lesion with intact mucosa Hypopharynx: Intact mucosa without pooling of secretions Larynx Glottic: Full true vocal cord mobility without lesion or mass, VF atrophy b/l  Supraglottic: Normal appearing epiglottis and AE folds Interarytenoid Space: Moderate pachydermia&edema Subglottic Space: Patent without lesion or edema Neck Neck and Trachea: Midline trachea without mass or lesion Thyroid: No mass or nodularity Lymphatics: No lymphadenopathy  Procedure: Summary of Video-Laryngeal-Stroboscopy: b/l VF atrophy glottic insufficiency, supraglottic compression, no lesions, mucosal wave is symmetric and intact, pooling of secretions in both pyriform sinuses, worse on the left  Preoperative diagnosis: hoarseness dysphagia and chronic cough   Postoperative diagnosis:   same + GERD LPR and post-nasal drainage  Procedure: Flexible fiberoptic laryngoscopy with stroboscopy (40981)  Surgeon: Ashok Croon, MD  Anesthesia: Topical lidocaine and Afrin  Complications: None  Condition is stable throughout exam  Indications and consent:   The patient presents to the clinic with hoarseness. All the risks, benefits, and potential complications were reviewed with the patient preoperatively and informed verbal consent was obtained.  Procedure: The patient was seated upright in the exam chair.   Topical lidocaine and Afrin were applied to the nasal cavity. After adequate anesthesia had occurred, the flexible telescope was passed into the nasal cavity. The nasopharynx was patent without mass or lesion. The scope was passed behind the soft palate and directed toward the base of tongue. The base of tongue was visualized and was symmetric with no apparent masses or abnormal appearing tissue. There were no signs of a mass or pooling of secretions in the piriform sinuses. The supraglottic structures were normal.  The true vocal cords are mobile. The medial edges were bowed. Closure was incomplete with supraglottic compression. Periodicity present. The mucosal wave and amplitude were intact. There is moderate interarytenoid pachydermia and post cricoid edema. The mucosa appears without lesions.   The laryngoscope was then slowly withdrawn and the patient tolerated the procedure well. There were no complications or blood loss.   Studies Reviewed: CT chest 09/12/23 IMPRESSION: 1. No pulmonary embolus. 2. Lower lung volumes from prior CT. Chronic interstitial lung disease. Mild diffuse ground-glass opacity, particularly in the lung bases, increased from prior. This may represent superimposed infection or edema.  3. Small presumed myelomatous lesions in the posterior left sixth rib and left inferior scapula. These are unchanged from prior. 4. Coronary artery calcifications.  Assessment/Plan: Encounter Diagnoses  Name Primary?   Age-related vocal fold atrophy    Dysphagia, unspecified type     Chronic nasal congestion    Environmental and seasonal allergies    Post-nasal drip    Chronic GERD    Chronic cough Yes   Chronic throat clearing    History of chronic lung disease    Oxygen dependent    Glottic insufficiency     Assessment and Plan    Chronic Cough and Throat Clearing Chronic cough and throat clearing, likely multifactorial including post-nasal drip, chronic GERD, and vocal fold atrophy. Symptoms exacerbated by interstitial lung disease and nasal cannula O2 requirement. Examination revealed pooling of secretions in pyriform sinuses concerning for dysphagia. 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  Discussed voice therapy as first-line therapy. Reviewed potential benefits and risks of current medications and new treatments such as steroid nasal rinses and switching antihistamines. - Order swallow study to evaluate for dysphagia (MBS esophagram) - Refer to speech therapy for voice therapy for vocal fold atrophy glottic insufficiency - Continue Azelastine for chronic nasal congestion - Switch from Claritin to Zyrtec - Add budesonide or mometasone to nasal rinses - Use Vaseline to prevent nasal dryness - Continue omeprazole and reflux raft, especially at night - Continue Delsym and Mucinex as needed for cough  Chronic Nasal Congestion Post-Nasal Drip Post-nasal drip contributing to chronic cough and throat clearing. Examination showed clear drainage in the back of the nose and nasal irritation likely due to allergies and O2 supplementation via nasal cannula. Discussed benefits of saline nasal rinses and potential for steroid nasal rinses to cover a larger surface area. Informed about slight risk of epistaxis with oxygen therapy and importance of using Vaseline to prevent nasal dryness. - Continue saline nasal rinses - Add budesonide or mometasone to nasal rinses - Switch from Claritin to Zyrtec - Continue azelastine 2 puffs b/l nares  twice daily - Use Vaseline to prevent nasal dryness  Chronic Gastroesophageal Reflux Disease (GERD) GERD contributing to chronic throat clearing and cough.  Discussed importance of taking omeprazole and reflux raft, especially at night, and potential benefits of using reflux raft after large meals during the day. - Continue omeprazole 40 mg daily - Continue reflux raft, especially at night - Consider using reflux raft after large meals during the day  Chronic dysphonia and Vocal Fold Atrophy/glottic insufficiency on strobe exam Vocal fold atrophy likely contributing to voice changes. Examination revealed age-appropriate thinning of the vocal folds but vocal folds were mobile without masses or lesions.  - Refer to speech therapy for voice therapy   Follow-up - Schedule follow-up after swallow study and voice therapy - Fax Rx for steroid for nasal rinses - Schedule MBS esophagram    Thank you for allowing me to participate in the care of this patient. Please do not hesitate to contact me with any questions or concerns.   Ashok Croon, MD Otolaryngology [facility] Phone: [phone] Fax: [phone]    10/07/2023, 7:48 PM

## 2023-10-07 NOTE — Patient Instructions (Signed)
-   stop Flonase and start steroid rinses for the nose twice daily - stop Claritin and start Zyrtec - continue Azelastine twice daily  - schedule swallow study - do voice therapy for vocal fold thinning  - return after testing

## 2023-10-08 ENCOUNTER — Ambulatory Visit: Payer: Medicare Other

## 2023-10-08 ENCOUNTER — Inpatient Hospital Stay: Payer: Medicare Other | Attending: Hematology and Oncology

## 2023-10-08 DIAGNOSIS — R498 Other voice and resonance disorders: Secondary | ICD-10-CM

## 2023-10-08 DIAGNOSIS — C9000 Multiple myeloma not having achieved remission: Secondary | ICD-10-CM | POA: Diagnosis present

## 2023-10-08 LAB — CBC WITH DIFFERENTIAL (CANCER CENTER ONLY)
Abs Immature Granulocytes: 0.02 10*3/uL (ref 0.00–0.07)
Basophils Absolute: 0 10*3/uL (ref 0.0–0.1)
Basophils Relative: 1 %
Eosinophils Absolute: 0.8 10*3/uL — ABNORMAL HIGH (ref 0.0–0.5)
Eosinophils Relative: 14 %
HCT: 45.1 % (ref 39.0–52.0)
Hemoglobin: 15.4 g/dL (ref 13.0–17.0)
Immature Granulocytes: 0 %
Lymphocytes Relative: 23 %
Lymphs Abs: 1.3 10*3/uL (ref 0.7–4.0)
MCH: 31.1 pg (ref 26.0–34.0)
MCHC: 34.1 g/dL (ref 30.0–36.0)
MCV: 91.1 fL (ref 80.0–100.0)
Monocytes Absolute: 0.4 10*3/uL (ref 0.1–1.0)
Monocytes Relative: 8 %
Neutro Abs: 3 10*3/uL (ref 1.7–7.7)
Neutrophils Relative %: 54 %
Platelet Count: 202 10*3/uL (ref 150–400)
RBC: 4.95 MIL/uL (ref 4.22–5.81)
RDW: 13.3 % (ref 11.5–15.5)
WBC Count: 5.5 10*3/uL (ref 4.0–10.5)
nRBC: 0 % (ref 0.0–0.2)

## 2023-10-08 LAB — CMP (CANCER CENTER ONLY)
ALT: 31 U/L (ref 0–44)
AST: 33 U/L (ref 15–41)
Albumin: 4.2 g/dL (ref 3.5–5.0)
Alkaline Phosphatase: 64 U/L (ref 38–126)
Anion gap: 5 (ref 5–15)
BUN: 18 mg/dL (ref 8–23)
CO2: 30 mmol/L (ref 22–32)
Calcium: 9.4 mg/dL (ref 8.9–10.3)
Chloride: 98 mmol/L (ref 98–111)
Creatinine: 1 mg/dL (ref 0.61–1.24)
GFR, Estimated: >60 mL/min (ref >60)
Glucose, Bld: 85 mg/dL (ref 70–99)
Potassium: 4.4 mmol/L (ref 3.5–5.1)
Sodium: 133 mmol/L — ABNORMAL LOW (ref 135–145)
Total Bilirubin: 0.5 mg/dL (ref <1.2)
Total Protein: 7.8 g/dL (ref 6.5–8.1)

## 2023-10-08 LAB — LACTATE DEHYDROGENASE: LDH: 298 U/L — ABNORMAL HIGH (ref 98–192)

## 2023-10-08 NOTE — Patient Instructions (Signed)
Gargle with water and spit out after using inhaler each time   Be VERY mindful about throat clearing  Use reflux raft as recommended by Irene Pap

## 2023-10-08 NOTE — Therapy (Signed)
OUTPATIENT SPEECH LANGUAGE PATHOLOGY VOICE TREATMENT    Patient Name: Samuel [name] MRN: [id number] DOB:1956-01-13, 67 y.o., male Today's Date: 10/08/2023  PCP: Tally Joe, MD REFERRING PROVIDER: Tally Joe, MD  END OF SESSION:  End of Session - 10/08/23 1406     Visit Number 21    Number of Visits 23    Date for SLP Re-Evaluation 11/03/23    Authorization Type medicare/ BCBS    SLP Start Time 1400    SLP Stop Time  1445    SLP Time Calculation (min) 45 min    Activity Tolerance Patient tolerated treatment well               Past Medical History:  Diagnosis Date   Asthma    GERD (gastroesophageal reflux disease)    High cholesterol    under control.    History of blood transfusion    Hypothyroidism    Interstitial lung disease (HCC)    Multiple myeloma (HCC)    Perennial allergic rhinitis    Pneumonia    Sciatic pain, right    Seasonal allergic rhinitis    Thyroid disease    Past Surgical History:  Procedure Laterality Date   BRONCHIAL BIOPSY  06/18/2021   Procedure: BRONCHIAL BIOPSIES;  Surgeon: Leslye Peer, MD;  Location: [facility];  Service: Cardiopulmonary;;   BRONCHIAL BIOPSY  08/28/2021   Procedure: BRONCHIAL BIOPSIES;  Surgeon: Josephine Igo, DO;  Location: [facility];  Service: Cardiopulmonary;;   BRONCHIAL WASHINGS  06/18/2021   Procedure: BRONCHIAL WASHINGS;  Surgeon: Leslye Peer, MD;  Location: [facility];  Service: Cardiopulmonary;;   BRONCHIAL WASHINGS  08/28/2021   Procedure: BRONCHIAL WASHINGS;  Surgeon: Josephine Igo, DO;  Location: [facility];  Service: Cardiopulmonary;;   NASAL SINUS SURGERY     NECK SURGERY  1996   VIDEO BRONCHOSCOPY N/A 06/18/2021   Procedure: VIDEO BRONCHOSCOPY WITH FLUORO;  Surgeon: Leslye Peer, MD;  Location: [facility];  Service: Cardiopulmonary;  Laterality: N/A;   VIDEO BRONCHOSCOPY N/A 08/28/2021   Procedure: VIDEO BRONCHOSCOPY WITH FLUORO;  Surgeon: Josephine Igo, DO;   Location: [facility];  Service: Cardiopulmonary;  Laterality: N/A;   Patient Active Problem List   Diagnosis Date Noted   Essential hypertension 07/04/2023   Coronary artery disease 11/28/2022   Mixed hyperlipidemia 11/28/2022   Recurrent sinusitis 11/04/2022   Acute bacterial rhinosinusitis 09/26/2022   Chronic respiratory failure with hypoxia (HCC) 08/01/2022   S/P bronchoscopy    ILD (interstitial lung disease) (HCC)    Preop cardiovascular exam 08/21/2021   Pneumonitis 07/20/2021   Anxiety 07/20/2021   Interstitial pulmonary disease (HCC) 07/20/2021   Abnormal CT of the chest 06/18/2021   Dyspnea 06/15/2021   Hypoxia 06/15/2021   Multiple myeloma (HCC) 03/05/2021   Multiple myeloma not having achieved remission (HCC) 03/05/2021   Bone lesion 02/14/2021   Monoclonal gammopathy 02/14/2021   High cholesterol    Allergic rhinitis 11/23/2020   Mild persistent asthma 11/23/2020   SOB (shortness of breath) 10/04/2011    Onset date: 05/15/23 (referral date)  REFERRING DIAG: R49.0 (ICD-10-CM) - Dysphonia  THERAPY DIAG: Other voice and resonance disorders  Rationale for Evaluation and Treatment: Rehabilitation  SUBJECTIVE:   SUBJECTIVE STATEMENT: Enters with new onset hoarseness and overt SOB.  Accompanied by: self  PERTINENT HISTORY: "Samuel [name] is a 67 y.o. male who presents as a return patient, for follow-up of sinusitis and globus sensation. At time of last visit on 03/14/2023, nasal  culture was obtained following thick mucoid drainage noted in the right nasal cavity. This was abnormal, and demonstrated gram-positive bacilli. Patient was subsequently treated with a 2-week course of oral antibiotics, and presents today for posttreatment imaging. He has continued to use proton pump inhibitor twice daily, but continues to experience frequent throat clearing and globus sensation. He states he has been working on dietary and lifestyle modifications as previously instructed, and  has been partially successful. He still has difficulty with frequent throat clearing and needing to cough often. He also has not been able to obtain humidification for his oxygen, as the DME company stated they needed a prescription.  Last visit 03/14/2023: Samuel [name] is a 67 y.o. male who presents as a new consult, referred by [name], FNP , for evaluation and treatment of sinus issues, hoarseness, and throat symptoms. He is accompanied by his wife.  Patient had history of multiple myeloma, status post treatment with chemotherapy. He developed interstitial lung disease following chemotherapeutic treatment, and is currently followed closely by pulmonology. He also endorses history of significant environmental allergies, and reports that recent allergy testing showed positivity to "everything". He was previously on immunotherapy, but this was held during his cancer treatment. He is considering starting back on the chemotherapy due to the significance of his symptoms. He is currently using Singulair, antihistamine and Patanase nasal spray on a daily basis. He also reports using nasal saline irrigations on a daily basis. He reports history of deviated nasal septum repair in 1983. He denies history of sinusitis requiring treatment with oral antibiotics in the last 12 months. His primary symptoms are nasal congestion and postnasal drainage. He denies facial pressure, pain, hyposmia, hypogeusia. He has not had any imaging of his head or sinuses. He does occasionally experience headaches. He uses oxygen on a nightly basis, and states that his oxygen is currently not humidified.  He reports symptoms of silent reflux, and states he is currently taking omeprazole on a daily basis. [name] is frequent throat clearing and globus sensation. He also reports occasional hoarseness."  PAIN: Are you having pain? No  FALLS: Has patient fallen in last 6 months? No  LIVING ENVIRONMENT: Lives with: lives  with their family Lives in: House/apartment  PLOF:Level of assistance: Independent with ADLs, Independent with IADLs Employment: Full-time employment  PATIENT GOALS: "control my breath, control my speech, and relaxation"   OBJECTIVE:   DIAGNOSTIC FINDINGS: ENT 04/04/23:  "Nasolaryngoscopy performed at last visit demonstrated moderate interarytenoid edema with otherwise normal upper airway exam. Counseled patient to continue twice daily omeprazole for next 2-3 months. We discussed dietary lifestyle modifications for treatment of reflux to include avoiding spicy and acidic foods which exacerbate reflux, avoiding late meals and snacking, avoiding caffeine and stimulants, avoiding carbonated and alcoholic beverages, adequate oral hydration, avoidance of throat clearing and sleeping with the head of bed elevated. Recommended follow-up with gastroenterology if symptoms do not improve after 3 months of twice daily PPI. Offered referral to speech-language pathology due to continued hoarseness and frequent throat clearing, patient was agreeable, order placed today."   ENT 10/07/23 "Chronic Cough and Throat Clearing Chronic cough and throat clearing, likely multifactorial including post-nasal drip, chronic GERD, and vocal fold atrophy. Symptoms exacerbated by interstitial lung disease and nasal cannula O2 requirement. Examination revealed pooling of secretions in pyriform sinuses concerning for dysphagia. 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  Discussed voice therapy as first-line therapy. Reviewed potential benefits and risks of current medications and new  treatments such as steroid nasal rinses and switching antihistamines. - Order swallow study to evaluate for dysphagia (MBS esophagram) - Refer to speech therapy for voice therapy for vocal fold atrophy glottic insufficiency - Continue Azelastine for chronic nasal congestion - Switch from Claritin to Zyrtec - Add  budesonide or mometasone to nasal rinses - Use Vaseline to prevent nasal dryness - Continue omeprazole and reflux raft, especially at night - Continue Delsym and Mucinex as needed for cough   Chronic Nasal Congestion Post-Nasal Drip Post-nasal drip contributing to chronic cough and throat clearing. Examination showed clear drainage in the back of the nose and nasal irritation likely due to allergies and O2 supplementation via nasal cannula. Discussed benefits of saline nasal rinses and potential for steroid nasal rinses to cover a larger surface area. Informed about slight risk of epistaxis with oxygen therapy and importance of using Vaseline to prevent nasal dryness. - Continue saline nasal rinses - Add budesonide or mometasone to nasal rinses - Switch from Claritin to Zyrtec - Continue azelastine 2 puffs b/l nares twice daily - Use Vaseline to prevent nasal dryness   Chronic Gastroesophageal Reflux Disease (GERD) GERD contributing to chronic throat clearing and cough.  Discussed importance of taking omeprazole and reflux raft, especially at night, and potential benefits of using reflux raft after large meals during the day. 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 Examination revealed age-appropriate thinning of the vocal folds but vocal folds were mobile without masses or lesions.  - Refer to speech therapy for voice therapy/"    PATIENT REPORTED OUTCOME MEASURES (PROM): Communication Participation Item Bank: 9 "quite a bit" for all situations  but "very much" for getting turn in fast moving conversation  TODAY'S TREATMENT:                                                                                                                                          10-08-23: Overt SOB and hoarseness exhibited this session, which was notably worse than last session. Recommended f/u with pulmonologist d/t persistent difficulty breathing impacting voice and overall functioning. Recently saw ENT and pulmonary rehab. Re-educated ENT results and recommendations, including being increasingly intentional with reducing throat clearing and taking reflux raft for reflux management. Suggested gargling with water after inhaler use d/t SLP research re: dysphonia and inhalers. Plan to initiate new interventions, circumlaryngeal message and PhoRTE, next session pending pt respiratory baseline to address newly diagnosed vocal fold atrophy and supraglottic compression.   09-24-23: Visited ED on 09/12/23 d/t shortness of breath. Began course of antibiotics for suspected bronchitis/pneumonia and symptoms have resolved. Despite recent respiratory challenges, pt reports wanting to continue IMST exercises as he has been  completing daily HEP and feels it is significantly improving respiratory strength. Additionally, a friend provided positive affirmation of voice strength and quality over 2 hour conversation. In previous session, SLP set device to 26.25 cm H20 based on MIP calculation and instructed pt not to adjust it, however the device was set to 33 cm H20 when pt entered. Reassessed MIP today across 3 trials: 80, 58, 75. Adjusted IMST device to 75% of MIP (80): 60 cm H20 (SLP set to max setting: 41 cm H20). Pt demo'd 25 reps with rare min A. Reviewed reflux precautions as pt endorses increased globus sensation/congestion in throat. Pt would like f/u with ENT for second opinion; requested today. Recommended inquiring about pulmonary rehab at upcoming appointment d/t success with IMST thus far. Re-assess MIP next session.   09-08-23: Continues to c/o significant asthma impacting respiratory function and subsequently voice. Initiated education and instruction of Set designer (IMST) to address impaired breath support impacting vocal intensity and clarity. Measured MIP across 3 trials: 35, 29, 17. Modified provided IMST device to 75% of MIP (35): 26.25 cm H2O. Instructed patient on how to use device and perform recommended exercises (see pt instructions for HEP). Pt able to demo 25 reps with occasional min A. Current device setting was deemed appropriate. Re-assess MIP next session.   08-26-23: Reports feeling under the weather during the past week and experiencing congestion from asthma today. SLP led discussion about preparing for work meetings occurring after ST. Recommended taking 5 to 10 minutes before meetings to reset breathing and prepare for prolonged speech. Pt identified x2 strategies to focus on during meeting with mod I. In 30 minute unstructured conversation, pt experienced occasional wet vocal quality, likely due to congestion, but independently self-corrected. Otherwise maintained strong breath support and clear vocal quality with rare min A and x3 cough/throat clears. Pt endorses overall improvements in conversational speech, but wants to continue targeting respiratory support.   08-12-23: Reports ongoing improvements in voice as well as breathing with less frequent cues provided by wife to use strategies. Continue to target use of diaphragmatic breathing, intentional upper body relaxation, and forward projection of voice in dual tasking scenarios. Pt aware of increased tension and reduced abdominal breathing during transition periods that include dividing attention between talking and other task (walking, answering phone). Focused on being intentional with relaxation and breath support to improve vocal quality and intensity during targeted dual tasking activities. Occasional min cues required to reduce tension, optimize breathing pattern, and project voice during these tasks.   08-06-23: Entered with hoarse vocal quality that improved independently, with pt  stating that he focused on using forward resonance. Maintained relaxed appearance, strong breath support, and clear vocal quality during 20-minute unstructured conversation. SLP directed pt to walk down the hall and return to therapy room to read a short passage x2 to target increased breath support and clear vocal quality in speech following exertion. Successfully completed task with occasional fading to rare min A, with improved breath support and vocal quality noted in the 2nd attempt. Benefited from cues to reset before beginning to read and take intentional pauses between sentences. Pt self-rated first attempt a 7.5 and second attempt an 8.5-9 out of 10. Addressed breath support during exertion by completing walking and talking task. Pt maintained structured conversation while completing laps around therapy gym with occasional mod fading to min A to take breaths in between sentences and stay relaxed. Increased success noted in additional trial.   07-29-23: Reported  ongoing HEP completion. Endorsed overall improvement of SOVT exercises, although some days are better than others. Targeted SOVTE to reduce vocal tension and improve clarity. Completed sustained exhale, hum, and glides into water with occasional min A to reduce upper body tension and take reset breaths. Introduced straw talking exercise with short phrases to improve forward resonance. Used negative practice to alternate between back-focused voice and forward resonance to improve awareness of vocal quality. Accurately read aloud a list of 8-9 word sentences using forward resonance with occasional min A. Back-focused voice more apparent at the end of sentences. Benefited from cues to break up long sentences with a pause and reset with a straw or hum.  07-23-23: Entered with clear vocal quality and reported completing SOVT HEP intermittently. Targeted SOVT exercises to reduce tension in vocal folds and improve vocal quality. Aware he can sustain  exhale longer than before during exercises (averaged 6 sec for breath only). Completed hum, accents, and happy birthday into water given occasional fading to rare min A to relax upper body tension and increase breath support. Maintained 15 minute conversation with clear vocal quality and minimal upper body tension independently. Pt endorsed increasing awareness of body tension in the community; however, his wife occasionally cues him to relax which causes frustration. SLP counseled pt that the strategies he is learning will take time to master in the community. No coughing or throat clearing observed throughout session.    PATIENT EDUCATION: Education details: see above  Person educated: Patient and Spouse Education method: Medical illustrator Education comprehension: verbalized understanding, returned demonstration, and needs further education  HOME EXERCISE PROGRAM: Abdominal breathing, forward resonance  GOALS: Goals reviewed with patient? Yes  SHORT TERM GOALS: Target date: 06/23/2023  Pt will reduce throat clearing and coughing by 50% at STG date using trained techniques Baseline: > 9, 6 respectively; 1/0 at STG date Goal status: MET  2.  Pt will employ abdominal breathing during structured tasks with 80% accuracy given occasional min A  Baseline:  Goal status: MET  3.  Pt will employ abdominal breathing during structured conversation with 80% accuracy given occasional min A Baseline:  Goal status: MET  4.  Pt will maintain clear vocal quality in 5-10 minute structured conversations x2 given occasional min A  Baseline:  Goal status: MET    LONG TERM GOALS: Target date: 07/21/2023 (11/03/2023 for recert)   Pt will reduce throat clearing and coughing by 75% at LTG date using trained techniques Baseline:  Goal status: MET  2.  Pt will employ abdominal breathing during unstructured conversation with 80% accuracy given rare min A Baseline:  Goal status: MET  3.  Pt  will maintain clear vocal quality in 15+ minute unstructured conversations x2 given rare min A  Baseline: 07-23-23, 08-06-23 Goal status: MET   4.  Pt will carryover trained voice techniques during community outings x2 with less frequent cues required by wife  Baseline: 08-12-23, 09-24-23 Goal status: MET  5.  Pt will complete IMST HEP with rare min A resulting in increased MIP at 4 weeks Baseline: MIP=26.25 Goal status: MET  6.  Pt will report improved communication effectiveness via PROM by 2 pts at last ST session  Baseline: CPIB=9 Goal status: IN PROGRESS (at recert)  ASSESSMENT:  CLINICAL IMPRESSION: Patient is a 67 y.o. M who was seen today for dysphonia. PMHX significant for Multiple Myeloma, interstitial lung disease, and GERD. Conducted ongoing education of IMST to optimize breath support and vocal quality. Reviewed reflux  recommendations and throat clear alternatives d/t pt report of increased globus sensation/throat congestion. Due to new dx of VF atrophy and supraglottic compression, educate and train additional voice exercises next session pending respiratory baseline. Pt continuing to benefit from skilled ST intervention, such as training vocal hygiene protocol and voice techniques to optimize vocal quality and communication effectiveness.   OBJECTIVE IMPAIRMENTS: include voice disorder. These impairments are limiting patient from effectively communicating at home and in community. Factors affecting potential to achieve goals and functional outcome are co-morbidities. Patient will benefit from skilled SLP services to address above impairments and improve overall function.  REHAB POTENTIAL: Good  PLAN:  SLP FREQUENCY: 2x/week (decrease to every other week)  SLP DURATION: 8 weeks (+ 8 additional weeks for recert)  PLANNED INTERVENTIONS: Language facilitation, Environmental controls, Cueing hierachy, Internal/external aids, Functional tasks, Multimodal communication approach,  SLP instruction and feedback, Compensatory strategies, Patient/family education, and Re-evaluation    Gracy Racer, CCC-SLP 10/08/2023, 3:01 PM

## 2023-10-09 LAB — KAPPA/LAMBDA LIGHT CHAINS
Kappa free light chain: 39.3 mg/L — ABNORMAL HIGH (ref 3.3–19.4)
Kappa, lambda light chain ratio: 2.23 — ABNORMAL HIGH (ref 0.26–1.65)
Lambda free light chains: 17.6 mg/L (ref 5.7–26.3)

## 2023-10-10 ENCOUNTER — Telehealth: Payer: Self-pay | Admitting: Adult Health

## 2023-10-10 MED ORDER — LEVOFLOXACIN 500 MG PO TABS: 500.0000 mg | ORAL_TABLET | Freq: Every day | ORAL | 0 refills | Status: DC

## 2023-10-10 NOTE — Telephone Encounter (Signed)
Feels he still has bronchitis with increased cough and congestion with thick mucus.  Feels that this is making him cough.  Feels that he still has infection with thick discolored mucus. No fever.  Appetite is good no nausea vomiting diarrhea.  Remains on oxygen 2 L with activity and at bedtime.  Uses increased oxygen with exercise.  Has been exercising daily.  Did not exercise today.  Does get more winded with activities.  Recent CT chest negative for PE.  Has a few days left over prednisone.  Patient says he was doing some better but over the last 5 to 6 days cough and congestion have slowly progressed.  Patient denies any recent travel.  No known sick contacts.  No hemoptysis.  Will call in Levaquin for 7 days.  No cardiac history or QT C prolongation history Patient is to take with food. Has a penicillin allergy.  Previously took Z-Pak.  Patient advised if symptoms are not improving he is to contact us for sooner follow-up.  Or go to the emergency room  Please contact office for sooner follow up if symptoms do not improve or worsen or seek emergency care   Patient is to come into the office for a follow-up visit January 7 at 11 AM.  Patient is aware of appointment.

## 2023-10-10 NOTE — Telephone Encounter (Signed)
Appointment set up for 10/28/2023 at 11 am per [facility] NP.  Nothing further needed.

## 2023-10-10 NOTE — Telephone Encounter (Signed)
Was recetly  Rx'd Zpack and steroids. Still not well. Can he get another round  Antibx that is possibly stronger?  Ilda Basset is [facility]  His # is [phone]

## 2023-10-10 NOTE — Telephone Encounter (Signed)
Spoke with the pt  He c/o increased DOE and chest tightnessfor the past 6 days  He has minimal wheezing and cough  He has some dry cough He feels like mucus is stuck in his throat- sees ENT and is scheduled for "scope" early Jan 2025 He is taking breztri, delsym, mucinex  Recently had zpack  He has not had fevers, aches  Tammy- please advise, thanks!  Allergies  Allergen Reactions   Clarithromycin Other (See Comments)    Hiccups   Ofev [Nintedanib] Other (See Comments)    GI bleeding   Penicillins Other (See Comments)    Child hood unsure

## 2023-10-13 ENCOUNTER — Other Ambulatory Visit: Payer: Self-pay | Admitting: Adult Health

## 2023-10-14 ENCOUNTER — Encounter (HOSPITAL_COMMUNITY)
Admission: RE | Admit: 2023-10-14 | Discharge: 2023-10-14 | Disposition: A | Discharge status: Home / Self Care | Payer: Medicare Other | Source: Ambulatory Visit | Attending: Internal Medicine

## 2023-10-14 VITALS — Wt 209.7 lb

## 2023-10-14 DIAGNOSIS — J849 Interstitial pulmonary disease, unspecified: Secondary | ICD-10-CM

## 2023-10-14 NOTE — Progress Notes (Signed)
Pulmonary Individual Treatment Plan  Patient Details  Name: Samuel [name] MRN: [id number] Date of Birth: 1956-07-01 Referring Provider:   Doristine Devoid Pulmonary Rehab Walk Test from 10/06/2023 in [facility]  Referring Provider Ramaswamy       Initial Encounter Date:  Flowsheet Row Pulmonary Rehab Walk Test from 10/06/2023 in [facility]  Date 10/06/23       Visit Diagnosis: ILD (interstitial lung disease) (HCC)  Patient's Home Medications on Admission:   Current Outpatient Medications:    acyclovir (ZOVIRAX) 400 MG tablet, Take 1 tablet (400 mg total) by mouth 2 (two) times daily., Disp: 60 tablet, Rfl: 3   albuterol (PROVENTIL) (2.5 MG/3ML) 0.083% nebulizer solution, USE ONE VIAL IN NEBULIZER EVERY 6 HOURS AS NEEDED WHEEZING OR SHORTNESS OF BREATH, Disp: 120 mL, Rfl: 12   albuterol (VENTOLIN HFA) 108 (90 Base) MCG/ACT inhaler, Inhale 2 puffs into the lungs every 6 hours as needed for wheezing or shortness of breath., Disp: 8.5 g, Rfl: 1   ALPRAZolam (XANAX) 0.5 MG tablet, Take 0.5 mg by mouth at bedtime., Disp: , Rfl:    ARMOUR THYROID 90 MG tablet, Take 90 mg by mouth daily., Disp: , Rfl:    ASSESS FULL RANGE PEAK METER DEVI, as directed., Disp: , Rfl:    azithromycin (ZITHROMAX) 250 MG tablet, Take 2 tablets on day one, then one tablet, days 2-5., Disp: 6 tablet, Rfl: 0   bisoprolol (ZEBETA) 5 MG tablet, Take 1 tablet (5 mg total) by mouth daily., Disp: 30 tablet, Rfl: 2   cholecalciferol (VITAMIN D) 1000 UNITS tablet, Take 3,000 Units by mouth daily., Disp: , Rfl:    Dextromethorphan HBr (DELSYM PO), Take by mouth as needed. For cough, Disp: , Rfl:    dextromethorphan-guaiFENesin (MUCINEX DM) 30-600 MG 12hr tablet, Take 1 tablet by mouth 2 (two) times daily., Disp: , Rfl:    EPINEPHrine (EPI-PEN) 0.3 mg/0.3 mL DEVI, Inject 0.3 mg into the muscle as needed.,  Disp: , Rfl:    ezetimibe-simvastatin (VYTORIN) 10-40 MG tablet, Take 1 tablet by mouth daily., Disp: 90 tablet, Rfl: 3   fluticasone (FLONASE) 50 MCG/ACT nasal spray, 1 spray., Disp: , Rfl:    hydrOXYzine (ATARAX) 25 MG tablet, Take 25 mg by mouth every 8 (eight) hours as needed., Disp: , Rfl:    ibuprofen (ADVIL) 200 MG tablet, Take 200 mg by mouth every 6 (six) hours as needed., Disp: , Rfl:    ketoconazole (NIZORAL) 2 % cream, SMARTSIG:1 Application Topical 1 to 2 Times Daily, Disp: , Rfl:    levothyroxine (SYNTHROID) 50 MCG tablet, Take 50 mcg by mouth daily before breakfast., Disp: , Rfl:    montelukast (SINGULAIR) 10 MG tablet, Take 10 mg by mouth at bedtime., Disp: , Rfl:    Multiple Vitamin (MULTIVITAMIN) tablet, Take 1 tablet by mouth daily., Disp: , Rfl:    omeprazole (PRILOSEC) 40 MG capsule, Take 1 capsule (40 mg total) by mouth 2 (two) times daily. Take 30 minutes before a meal, Disp: 60 capsule, Rfl: 3   OXYGEN, Inhale 2 L into the lungs continuous. As needed, Disp: , Rfl:    sildenafil (REVATIO) 20 MG tablet, Take 20 mg by mouth daily as needed (ED)., Disp: , Rfl:    sodium chloride (OCEAN) 0.65 % SOLN nasal spray, Place 1 spray into both nostrils as needed for congestion., Disp: , Rfl:  tamsulosin (FLOMAX) 0.4 MG CAPS capsule, Take 0.4 mg by mouth daily., Disp: , Rfl:    triamcinolone cream (KENALOG) 0.1 %, Apply 1 application topically daily as needed (sun burn itch)., Disp: , Rfl:    Turmeric 400 MG CAPS, 1 capsule Orally once a day, Disp: , Rfl:    aspirin EC 81 MG tablet, Take 1 tablet (81 mg total) by mouth daily. Swallow whole., Disp: 90 tablet, Rfl: 3   azelastine (ASTELIN) 0.1 % nasal spray, Place 2 sprays into both nostrils 2 (two) times daily. Use in each nostril as directed, Disp: 30 mL, Rfl: 12   Budeson-Glycopyrrol-Formoterol (BREZTRI AEROSPHERE) 160-9-4.8 MCG/ACT AERO, Inhale 2 puffs into the lungs in the morning and at bedtime., Disp: 10.7 g, Rfl: 11   cetirizine  (ZYRTEC) 10 MG tablet, Take 1 tablet (10 mg total) by mouth daily., Disp: 30 tablet, Rfl: 11   Guaifenesin 1200 MG TB12, , Disp: , Rfl:    levofloxacin (LEVAQUIN) 500 MG tablet, Take 1 tablet (500 mg total) by mouth daily., Disp: 7 tablet, Rfl: 0   predniSONE (DELTASONE) 10 MG tablet, 4 x 2 days, 2 x 2 days, 1 x 2 days, 1/2 x 2 days, then stop, Disp: 15 tablet, Rfl: 0   predniSONE (DELTASONE) 10 MG tablet, Take 40 mg once daily x 3 days, then 30 mg once daily x 3 days, then 20 mg once daily x 3 days, then 10 mg once daily x 3 days and go to baseline dose., Disp: 30 tablet, Rfl: 0   Simethicone (SIMETHICONE ULTRA STRENGTH) 180 MG CAPS, Take 1 capsule (180 mg total) by mouth 3 (three) times daily as needed., Disp: 90 capsule, Rfl: 0   UNABLE TO FIND, Med Name: Allergy injections once weekly, Disp: , Rfl:   Past Medical History: Past Medical History:  Diagnosis Date   Asthma    GERD (gastroesophageal reflux disease)    High cholesterol    under control.    History of blood transfusion    Hypothyroidism    Interstitial lung disease (HCC)    Multiple myeloma (HCC)    Perennial allergic rhinitis    Pneumonia    Sciatic pain, right    Seasonal allergic rhinitis    Thyroid disease     Tobacco Use: Social History   Tobacco Use  Smoking Status Former   Current packs/day: 0.00   Average packs/day: 0.1 packs/day for 3.0 years (0.3 ttl pk-yrs)   Types: Cigarettes   Start date: 10/22/1979   Quit date: 10/21/1982   Years since quitting: 41.0  Smokeless Tobacco Never    Labs: Review Flowsheet       Latest Ref Rng & Units 06/15/2021 07/12/2021 09/27/2021  Labs for ITP Cardiac and Pulmonary Rehab  Hemoglobin A1c 4.6 - 6.5 % - 5.7  6.2   PH, Arterial 7.350 - 7.450 7.445  - -  PCO2 arterial 32.0 - 48.0 mmHg 34.0  - -  Bicarbonate 20.0 - 28.0 mmol/L 23.0  - -  O2 Saturation % 97.6  - -    Capillary Blood Glucose: No results found for: "GLUCAP"   Pulmonary Assessment Scores:  Pulmonary  Assessment Scores     Row Name 10/06/23 1017         ADL UCSD   ADL Phase Entry     SOB Score total 63       CAT Score   CAT Score 19       mMRC Score   mMRC Score  4             UCSD: Self-administered rating of dyspnea associated with activities of daily living (ADLs) 6-point scale (0 = "not at all" to 5 = "maximal or unable to do because of breathlessness")  Scoring Scores range from 0 to 120.  Minimally important difference is 5 units  CAT: CAT can identify the health impairment of COPD patients and is better correlated with disease progression.  CAT has a scoring range of zero to 40. The CAT score is classified into four groups of low (less than 10), medium (10 - 20), high (21-30) and very high (31-40) based on the impact level of disease on health status. A CAT score over 10 suggests significant symptoms.  A worsening CAT score could be explained by an exacerbation, poor medication adherence, poor inhaler technique, or progression of COPD or comorbid conditions.  CAT MCID is 2 points  mMRC: mMRC (Modified Medical Research Council) Dyspnea Scale is used to assess the degree of baseline functional disability in patients of respiratory disease due to dyspnea. No minimal important difference is established. A decrease in score of 1 point or greater is considered a positive change.   Pulmonary Function Assessment:  Pulmonary Function Assessment - 10/06/23 0957       Breath   Bilateral Breath Sounds Rales    Shortness of Breath Yes;Limiting activity;Panic with Shortness of Breath;Fear of Shortness of Breath             Exercise Target Goals: Exercise Program Goal: Individual exercise prescription set using results from initial 6 min walk test and THRR while considering  patient's activity barriers and safety.   Exercise Prescription Goal: Initial exercise prescription builds to 30-45 minutes a day of aerobic activity, 2-3 days per week.  Home exercise guidelines  will be given to patient during program as part of exercise prescription that the participant will acknowledge.  Activity Barriers & Risk Stratification:  Activity Barriers & Cardiac Risk Stratification - 10/06/23 0951       Activity Barriers & Cardiac Risk Stratification   Activity Barriers Deconditioning;Muscular Weakness;Shortness of Breath    Cardiac Risk Stratification Moderate             6 Minute Walk:  6 Minute Walk     Row Name 10/06/23 1043         6 Minute Walk   Phase Initial     Distance 730 feet     Distance Feet Change 730 ft     Walk Time 6 minutes     # of Rest Breaks 0     MPH 1.38     METS 1.7     RPE 12     Perceived Dyspnea  1.5     VO2 Peak 5.97     Symptoms No     Resting HR 56 bpm     Resting Oxygen Saturation  95 %     Exercise Oxygen Saturation  during 6 min walk 89 %     Max Ex. HR 74 bpm     Max Ex. BP 128/70     2 Minute Post BP 112/68       Interval HR   1 Minute HR 68     2 Minute HR 72     3 Minute HR 74     4 Minute HR 74     5 Minute HR 74     6 Minute HR 74  2 Minute Post HR 57     Interval Heart Rate? Yes       Interval Oxygen   Interval Oxygen? Yes     Baseline Oxygen Saturation % 95 %     1 Minute Oxygen Saturation % 97 %     1 Minute Liters of Oxygen 2 L     2 Minute Oxygen Saturation % 96 %     2 Minute Liters of Oxygen 2 L     3 Minute Oxygen Saturation % 91 %     3 Minute Liters of Oxygen 2 L     4 Minute Oxygen Saturation % 89 %     4 Minute Liters of Oxygen 2 L     5 Minute Oxygen Saturation % 90 %     5 Minute Liters of Oxygen 2 L     6 Minute Oxygen Saturation % 90 %     6 Minute Liters of Oxygen 2 L     2 Minute Post Oxygen Saturation % 97 %     2 Minute Post Liters of Oxygen 2 L              Oxygen Initial Assessment:  Oxygen Initial Assessment - 10/06/23 0952       Home Oxygen   Home Oxygen Device Home Concentrator;E-Tanks;Portable Concentrator    Sleep Oxygen Prescription Continuous     Liters per minute 2    Home Exercise Oxygen Prescription Continuous    Liters per minute 2    Home Resting Oxygen Prescription Continuous    Liters per minute 2    Compliance with Home Oxygen Use Yes      Initial 6 min Walk   Oxygen Used Continuous    Liters per minute 2      Program Oxygen Prescription   Program Oxygen Prescription Continuous    Liters per minute 2      Intervention   Short Term Goals To learn and exhibit compliance with exercise, home and travel O2 prescription;To learn and understand importance of monitoring SPO2 with pulse oximeter and demonstrate accurate use of the pulse oximeter.;To learn and understand importance of maintaining oxygen saturations>88%;To learn and demonstrate proper pursed lip breathing techniques or other breathing techniques. ;To learn and demonstrate proper use of respiratory medications    Long  Term Goals Exhibits compliance with exercise, home  and travel O2 prescription;Maintenance of O2 saturations>88%;Compliance with respiratory medication;Verbalizes importance of monitoring SPO2 with pulse oximeter and return demonstration;Exhibits proper breathing techniques, such as pursed lip breathing or other method taught during program session;Demonstrates proper use of MDI's             Oxygen Re-Evaluation:  Oxygen Re-Evaluation     Row Name 10/08/23 1036             Program Oxygen Prescription   Program Oxygen Prescription Continuous       Liters per minute 2         Home Oxygen   Home Oxygen Device Home Concentrator;E-Tanks;Portable Concentrator       Sleep Oxygen Prescription Continuous       Liters per minute 2       Home Exercise Oxygen Prescription Continuous       Liters per minute 2       Home Resting Oxygen Prescription Continuous       Liters per minute 2       Compliance with Home Oxygen Use Yes  Goals/Expected Outcomes   Short Term Goals To learn and exhibit compliance with exercise, home and travel O2  prescription;To learn and understand importance of monitoring SPO2 with pulse oximeter and demonstrate accurate use of the pulse oximeter.;To learn and understand importance of maintaining oxygen saturations>88%;To learn and demonstrate proper pursed lip breathing techniques or other breathing techniques. ;To learn and demonstrate proper use of respiratory medications       Long  Term Goals Exhibits compliance with exercise, home  and travel O2 prescription;Maintenance of O2 saturations>88%;Compliance with respiratory medication;Verbalizes importance of monitoring SPO2 with pulse oximeter and return demonstration;Exhibits proper breathing techniques, such as pursed lip breathing or other method taught during program session;Demonstrates proper use of MDI's       Goals/Expected Outcomes Compliance and understanding of oxygen saturation monitoring and breathing techniques to decrease shortness of breath.                Oxygen Discharge (Final Oxygen Re-Evaluation):  Oxygen Re-Evaluation - 10/08/23 1036       Program Oxygen Prescription   Program Oxygen Prescription Continuous    Liters per minute 2      Home Oxygen   Home Oxygen Device Home Concentrator;E-Tanks;Portable Concentrator    Sleep Oxygen Prescription Continuous    Liters per minute 2    Home Exercise Oxygen Prescription Continuous    Liters per minute 2    Home Resting Oxygen Prescription Continuous    Liters per minute 2    Compliance with Home Oxygen Use Yes      Goals/Expected Outcomes   Short Term Goals To learn and exhibit compliance with exercise, home and travel O2 prescription;To learn and understand importance of monitoring SPO2 with pulse oximeter and demonstrate accurate use of the pulse oximeter.;To learn and understand importance of maintaining oxygen saturations>88%;To learn and demonstrate proper pursed lip breathing techniques or other breathing techniques. ;To learn and demonstrate proper use of respiratory  medications    Long  Term Goals Exhibits compliance with exercise, home  and travel O2 prescription;Maintenance of O2 saturations>88%;Compliance with respiratory medication;Verbalizes importance of monitoring SPO2 with pulse oximeter and return demonstration;Exhibits proper breathing techniques, such as pursed lip breathing or other method taught during program session;Demonstrates proper use of MDI's    Goals/Expected Outcomes Compliance and understanding of oxygen saturation monitoring and breathing techniques to decrease shortness of breath.             Initial Exercise Prescription:  Initial Exercise Prescription - 10/06/23 1000       Date of Initial Exercise RX and Referring Provider   Date 10/06/23    Referring Provider Ramaswamy    Expected Discharge Date 01/01/23      Oxygen   Oxygen Continuous    Liters 2    Maintain Oxygen Saturation 88% or higher      Treadmill   MPH 2    Grade 1    Minutes 15      Recumbant Elliptical   Level 1    RPM 40    Watts 50    Minutes 15      Prescription Details   Frequency (times per week) 2    Duration Progress to 30 minutes of continuous aerobic without signs/symptoms of physical distress      Intensity   THRR 40-80% of Max Heartrate 61-122    Ratings of Perceived Exertion 11-13    Perceived Dyspnea 0-4      Progression   Progression Continue to progress workloads to  maintain intensity without signs/symptoms of physical distress.      Resistance Training   Training Prescription Yes    Weight blue bands    Reps 10-15             Perform Capillary Blood Glucose checks as needed.  Exercise Prescription Changes:   Exercise Comments:   Exercise Goals and Review:   Exercise Goals     Row Name 10/06/23 0952             Exercise Goals   Increase Physical Activity Yes       Intervention Provide advice, education, support and counseling about physical activity/exercise needs.;Develop an individualized  exercise prescription for aerobic and resistive training based on initial evaluation findings, risk stratification, comorbidities and participant's personal goals.       Expected Outcomes Short Term: Attend rehab on a regular basis to increase amount of physical activity.;Long Term: Add in home exercise to make exercise part of routine and to increase amount of physical activity.;Long Term: Exercising regularly at least 3-5 days a week.       Increase Strength and Stamina Yes       Intervention Provide advice, education, support and counseling about physical activity/exercise needs.;Develop an individualized exercise prescription for aerobic and resistive training based on initial evaluation findings, risk stratification, comorbidities and participant's personal goals.       Expected Outcomes Short Term: Increase workloads from initial exercise prescription for resistance, speed, and METs.;Short Term: Perform resistance training exercises routinely during rehab and add in resistance training at home;Long Term: Improve cardiorespiratory fitness, muscular endurance and strength as measured by increased METs and functional capacity ( )       Able to understand and use rate of perceived exertion (RPE) scale Yes       Intervention Provide education and explanation on how to use RPE scale       Expected Outcomes Short Term: Able to use RPE daily in rehab to express subjective intensity level;Long Term:  Able to use RPE to guide intensity level when exercising independently       Able to understand and use Dyspnea scale Yes       Intervention Provide education and explanation on how to use Dyspnea scale       Expected Outcomes Short Term: Able to use Dyspnea scale daily in rehab to express subjective sense of shortness of breath during exertion;Long Term: Able to use Dyspnea scale to guide intensity level when exercising independently       Knowledge and understanding of Target Heart Rate Range (THRR) Yes        Intervention Provide education and explanation of THRR including how the numbers were predicted and where they are located for reference       Expected Outcomes Short Term: Able to state/look up THRR;Long Term: Able to use THRR to govern intensity when exercising independently;Short Term: Able to use daily as guideline for intensity in rehab       Understanding of Exercise Prescription Yes       Intervention Provide education, explanation, and written materials on patient's individual exercise prescription       Expected Outcomes Short Term: Able to explain program exercise prescription;Long Term: Able to explain home exercise prescription to exercise independently                Exercise Goals Re-Evaluation :  Exercise Goals Re-Evaluation     Row Name 10/08/23 1035  Exercise Goal Re-Evaluation   Exercise Goals Review Increase Physical Activity;Able to understand and use Dyspnea scale;Understanding of Exercise Prescription;Increase Strength and Stamina;Knowledge and understanding of Target Heart Rate Range (THRR);Able to understand and use rate of perceived exertion (RPE) scale       Comments Samuel [name] is scheduled to begin exercise on 12/24. Will monitor and progress as able.       Expected Outcomes Through exercise at rehab and home, the patient will decrease shortness of breath with daily activities and feel confident in carrying out an exercise regimen at home.                Discharge Exercise Prescription (Final Exercise Prescription Changes):   Nutrition:  Target Goals: Understanding of nutrition guidelines, daily intake of sodium 1500mg , cholesterol 200mg , calories 30% from fat and 7% or less from saturated fats, daily to have 5 or more servings of fruits and vegetables.  Biometrics:    Nutrition Therapy Plan and Nutrition Goals:   Nutrition Assessments:  MEDIFICTS Score Key: >=70 Need to make dietary changes  40-70 Heart Healthy Diet <= 40  Therapeutic Level Cholesterol Diet   Picture Your Plate Scores: <01 Unhealthy dietary pattern with much room for improvement. 41-50 Dietary pattern unlikely to meet recommendations for good health and room for improvement. 51-60 More healthful dietary pattern, with some room for improvement.  >60 Healthy dietary pattern, although there may be some specific behaviors that could be improved.    Nutrition Goals Re-Evaluation:   Nutrition Goals Discharge (Final Nutrition Goals Re-Evaluation):   Psychosocial: Target Goals: Acknowledge presence or absence of significant depression and/or stress, maximize coping skills, provide positive support system. Participant is able to verbalize types and ability to use techniques and skills needed for reducing stress and depression.  Initial Review & Psychosocial Screening:  Initial Psych Review & Screening - 10/06/23 0949       Initial Review   Current issues with Current Anxiety/Panic;Current Psychotropic Meds;Current Stress Concerns    Source of Stress Concerns Chronic Illness;Unable to participate in former interests or hobbies    Comments Pt is stressed about his health. He also admits to having days of sadness because he is not able to do activities such as hiking anymore. He has good support from his family and friends.      Family Dynamics   Good Support System? Yes      Barriers   Psychosocial barriers to participate in program The patient should benefit from training in stress management and relaxation.;Psychosocial barriers identified (see note)      Screening Interventions   Interventions Encouraged to exercise    Expected Outcomes Long Term Goal: Stressors or current issues are controlled or eliminated.;Short Term goal: Utilizing psychosocial counselor, staff and physician to assist with identification of specific Stressors or current issues interfering with healing process. Setting desired goal for each stressor or current issue  identified.             Quality of Life Scores:  Scores of 19 and below usually indicate a poorer quality of life in these areas.  A difference of  2-3 points is a clinically meaningful difference.  A difference of 2-3 points in the total score of the Quality of Life Index has been associated with significant improvement in overall quality of life, self-image, physical symptoms, and general health in studies assessing change in quality of life.  PHQ-9: Review Flowsheet       10/06/2023  Depression screen [facility] 2/9  Decreased Interest 1  Down, Depressed, Hopeless 1  PHQ - 2 Score 2  Altered sleeping 0  Tired, decreased energy 0  Change in appetite 0  Feeling bad or failure about yourself  1  Trouble concentrating 0  Moving slowly or fidgety/restless 1  Suicidal thoughts 0  PHQ-9 Score 4  Difficult doing work/chores Somewhat difficult   Interpretation of Total Score  Total Score Depression Severity:  1-4 = Minimal depression, 5-9 = Mild depression, 10-14 = Moderate depression, 15-19 = Moderately severe depression, 20-27 = Severe depression   Psychosocial Evaluation and Intervention:  Psychosocial Evaluation - 10/06/23 0950       Psychosocial Evaluation & Interventions   Interventions Encouraged to exercise with the program and follow exercise prescription;Stress management education    Comments Samuel [name] admits to having stress due to his current health issues. He also admits to being "a little down". some days due to not being able to do certain activities anymore.  He is currently taking psychostropic meds. He denies needing a referral for a therapist at this time.    Expected Outcomes For Samuel [name] to have less stress throughout the program    Continue Psychosocial Services  No Follow up required             Psychosocial Re-Evaluation:  Psychosocial Re-Evaluation     Row Name 10/08/23 1212             Psychosocial Re-Evaluation   Current issues with Current  Depression;Current Psychotropic Meds;Current Stress Concerns       Comments No changes since orienation. Samuel [name] is scheduled to start the program on 10/14/23.       Expected Outcomes For Samuel [name] to attend PR free of any psy/soc barriers or concerns. For Samuel [name] to use positive coping mechanisms to reduce his stress.       Interventions Encouraged to attend Pulmonary Rehabilitation for the exercise       Continue Psychosocial Services  No Follow up required                Psychosocial Discharge (Final Psychosocial Re-Evaluation):  Psychosocial Re-Evaluation - 10/08/23 1212       Psychosocial Re-Evaluation   Current issues with Current Depression;Current Psychotropic Meds;Current Stress Concerns    Comments No changes since orienation. Samuel [name] is scheduled to start the program on 10/14/23.    Expected Outcomes For Samuel [name] to attend PR free of any psy/soc barriers or concerns. For Samuel [name] to use positive coping mechanisms to reduce his stress.    Interventions Encouraged to attend Pulmonary Rehabilitation for the exercise    Continue Psychosocial Services  No Follow up required             Education: Education Goals: Education classes will be provided on a weekly basis, covering required topics. Participant will state understanding/return demonstration of topics presented.  Learning Barriers/Preferences:  Learning Barriers/Preferences - 10/06/23 0950       Learning Barriers/Preferences   Learning Barriers Sight    Learning Preferences Individual Instruction;Written Material             Education Topics: Know Your Numbers Group instruction that is supported by a PowerPoint presentation. Instructor discusses importance of knowing and understanding resting, exercise, and post-exercise oxygen saturation, heart rate, and blood pressure. Oxygen saturation, heart rate, blood pressure, rating of perceived exertion, and dyspnea are reviewed along with a normal range for these values.    Exercise for  the Pulmonary Patient Group instruction that is supported by a  PowerPoint presentation. Instructor discusses benefits of exercise, core components of exercise, frequency, duration, and intensity of an exercise routine, importance of utilizing pulse oximetry during exercise, safety while exercising, and options of places to exercise outside of rehab.    MET Level  Group instruction provided by PowerPoint, verbal discussion, and written material to support subject matter. Instructor reviews what METs are and how to increase METs.    Pulmonary Medications Verbally interactive group education provided by instructor with focus on inhaled medications and proper administration.   Anatomy and Physiology of the Respiratory System Group instruction provided by PowerPoint, verbal discussion, and written material to support subject matter. Instructor reviews respiratory cycle and anatomical components of the respiratory system and their functions. Instructor also reviews differences in obstructive and restrictive respiratory diseases with examples of each.    Oxygen Safety Group instruction provided by PowerPoint, verbal discussion, and written material to support subject matter. There is an overview of "What is Oxygen" and "Why do we need it".  Instructor also reviews how to create a safe environment for oxygen use, the importance of using oxygen as prescribed, and the risks of noncompliance. There is a brief discussion on traveling with oxygen and resources the patient may utilize.   Oxygen Use Group instruction provided by PowerPoint, verbal discussion, and written material to discuss how supplemental oxygen is prescribed and different types of oxygen supply systems. Resources for more information are provided.    Breathing Techniques Group instruction that is supported by demonstration and informational handouts. Instructor discusses the benefits of pursed lip and diaphragmatic breathing and  detailed demonstration on how to perform both.     Risk Factor Reduction Group instruction that is supported by a PowerPoint presentation. Instructor discusses the definition of a risk factor, different risk factors for pulmonary disease, and how the heart and lungs work together.   Pulmonary Diseases Group instruction provided by PowerPoint, verbal discussion, and written material to support subject matter. Instructor gives an overview of the different type of pulmonary diseases. There is also a discussion on risk factors and symptoms as well as ways to manage the diseases.   Stress and Energy Conservation Group instruction provided by PowerPoint, verbal discussion, and written material to support subject matter. Instructor gives an overview of stress and the impact it can have on the body. Instructor also reviews ways to reduce stress. There is also a discussion on energy conservation and ways to conserve energy throughout the day.   Warning Signs and Symptoms Group instruction provided by PowerPoint, verbal discussion, and written material to support subject matter. Instructor reviews warning signs and symptoms of stroke, heart attack, cold and flu. Instructor also reviews ways to prevent the spread of infection.   Other Education Group or individual verbal, written, or video instructions that support the educational goals of the pulmonary rehab program.    Knowledge Questionnaire Score:  Knowledge Questionnaire Score - 10/06/23 1019       Knowledge Questionnaire Score   Pre Score 17/18             Core Components/Risk Factors/Patient Goals at Admission:  Personal Goals and Risk Factors at Admission - 10/06/23 0951       Core Components/Risk Factors/Patient Goals on Admission   Improve shortness of breath with ADL's Yes    Intervention Provide education, individualized exercise plan and daily activity instruction to help decrease symptoms of SOB with activities of daily  living.    Expected Outcomes Short Term: Improve cardiorespiratory fitness  to achieve a reduction of symptoms when performing ADLs;Long Term: Be able to perform more ADLs without symptoms or delay the onset of symptoms    Increase knowledge of respiratory medications and ability to use respiratory devices properly  Yes    Intervention Provide education and demonstration as needed of appropriate use of medications, inhalers, and oxygen therapy.    Expected Outcomes Short Term: Achieves understanding of medications use. Understands that oxygen is a medication prescribed by physician. Demonstrates appropriate use of inhaler and oxygen therapy.;Long Term: Maintain appropriate use of medications, inhalers, and oxygen therapy.    Stress Yes    Intervention Offer individual and/or small group education and counseling on adjustment to heart disease, stress management and health-related lifestyle change. Teach and support self-help strategies.;Refer participants experiencing significant psychosocial distress to appropriate mental health specialists for further evaluation and treatment. When possible, include family members and significant others in education/counseling sessions.    Expected Outcomes Short Term: Participant demonstrates changes in health-related behavior, relaxation and other stress management skills, ability to obtain effective social support, and compliance with psychotropic medications if prescribed.;Long Term: Emotional wellbeing is indicated by absence of clinically significant psychosocial distress or social isolation.             Core Components/Risk Factors/Patient Goals Review:   Goals and Risk Factor Review     Row Name 10/08/23 1220             Core Components/Risk Factors/Patient Goals Review   Personal Goals Review Develop more efficient breathing techniques such as purse lipped breathing and diaphragmatic breathing and practicing self-pacing with activity.;Increase  knowledge of respiratory medications and ability to use respiratory devices properly.;Improve shortness of breath with ADL's;Stress       Review No changes since orientation. Samuel [name] is scheduled to start the program on 10/14/23       Expected Outcomes For Samuel [name] to develop more efficient breathing techniques such as purse lipped breathing and diaphragmatic breathing; and practicing self-pacing activities, to increase his knowledge of respiratory medications and ability to use respiratory devices properly, improve his shortness of breath with ADLs, and to reduce stress.                Core Components/Risk Factors/Patient Goals at Discharge (Final Review):   Goals and Risk Factor Review - 10/08/23 1220       Core Components/Risk Factors/Patient Goals Review   Personal Goals Review Develop more efficient breathing techniques such as purse lipped breathing and diaphragmatic breathing and practicing self-pacing with activity.;Increase knowledge of respiratory medications and ability to use respiratory devices properly.;Improve shortness of breath with ADL's;Stress    Review No changes since orientation. Samuel [name] is scheduled to start the program on 10/14/23    Expected Outcomes For Samuel [name] to develop more efficient breathing techniques such as purse lipped breathing and diaphragmatic breathing; and practicing self-pacing activities, to increase his knowledge of respiratory medications and ability to use respiratory devices properly, improve his shortness of breath with ADLs, and to reduce stress.             ITP Comments:Pt is making expected progress toward Pulmonary Rehab goals after completing 0 session(s). Recommend continued exercise, life style modification, education, and utilization of breathing techniques to increase stamina and strength, while also decreasing shortness of breath with exertion.  Dr. Mechele Collin is Medical Director for Pulmonary Rehab at [facility].

## 2023-10-14 NOTE — Progress Notes (Signed)
Daily Session Note  Patient Details  Name: TAYQUAN DECOU MRN: [id number] Date of Birth: [birth year] Referring Provider:   Doristine Devoid Pulmonary Rehab Walk Test from 10/06/2023 in [facility]  Referring Provider Ramaswamy       Encounter Date: 10/14/2023  Check In:  Session Check In - 10/14/23 0819       Check-In   Supervising physician immediately available to respond to emergencies [facility] MD immediately available    Physician(s) Edd Fabian, NP    Location [facility]    Staff Present Raford Pitcher, MS, ACSM-CEP, Exercise Physiologist;Mary Gerre Scull, RN, Fuller Plan, RT    Virtual Visit No    Medication changes reported     No    Fall or balance concerns reported    No    Tobacco Cessation No Change    Warm-up and Cool-down Performed as group-led instruction    Resistance Training Performed Yes    VAD Patient? No    PAD/SET Patient? No      Pain Assessment   Currently in Pain? No/denies    Multiple Pain Sites No             Capillary Blood Glucose: No results found for this or any previous visit (from the past 24 hours).   Exercise Prescription Changes - 10/14/23 0900       Response to Exercise   Blood Pressure (Admit) 128/80    Blood Pressure (Exercise) 152/70    Blood Pressure (Exit) 120/66    Heart Rate (Admit) 64 bpm    Heart Rate (Exercise) 80 bpm    Heart Rate (Exit) 77 bpm    Oxygen Saturation (Admit) 100 %    Oxygen Saturation (Exercise) 87 %    Oxygen Saturation (Exit) 97 %    Rating of Perceived Exertion (Exercise) 13    Perceived Dyspnea (Exercise) 1    Duration Continue with 30 min of aerobic exercise without signs/symptoms of physical distress.    Intensity THRR unchanged      Progression   Progression Continue to progress workloads to maintain intensity without signs/symptoms of physical distress.    Average METs 1.9      Resistance Training   Training  Prescription Yes    Weight blue bands    Reps 10-15    Time 10 Minutes      Interval Training   Interval Training Yes    Equipment Treadmill;Recumbant Elliptical      Oxygen   Oxygen Continuous    Liters 8      Treadmill   MPH 1.5    Grade 0    Minutes 15      Recumbant Elliptical   Level 1    RPM 40    Minutes 15    METs 1.8             Social History   Tobacco Use  Smoking Status Former   Current packs/day: 0.00   Average packs/day: 0.1 packs/day for 3.0 years (0.3 ttl pk-yrs)   Types: Cigarettes   Start date: 10/22/1979   Quit date: 10/21/1982   Years since quitting: 41.0  Smokeless Tobacco Never    Goals Met:  Exercise tolerated well No report of concerns or symptoms today Strength training completed today  Goals Unmet:  Not Applicable  Comments: Service time is from 0810 to 0940.    Dr. Mechele Collin is Medical Director for Pulmonary Rehab at  [facility]

## 2023-10-16 ENCOUNTER — Encounter (HOSPITAL_COMMUNITY)
Admission: RE | Admit: 2023-10-16 | Discharge: 2023-10-16 | Disposition: A | Discharge status: Home / Self Care | Payer: Medicare Other | Source: Ambulatory Visit | Attending: Internal Medicine | Admitting: Internal Medicine

## 2023-10-16 DIAGNOSIS — J849 Interstitial pulmonary disease, unspecified: Secondary | ICD-10-CM | POA: Diagnosis not present

## 2023-10-16 NOTE — Progress Notes (Signed)
Daily Session Note  Patient Details  Name: Samuel [name] MRN: [id number] Date of Birth: 03/27/1956 Referring Provider:   Doristine Devoid Pulmonary Rehab Walk Test from 10/06/2023 in [facility], [facility]  Referring Provider Ramaswamy       Encounter Date: 10/16/2023  Check In:  Session Check In - 10/16/23 0820       Check-In   Supervising physician immediately available to respond to emergencies [facility] MD immediately available    Physician(s) Reather Littler, NP    Location [facility]    Staff Present Raford Pitcher, MS, ACSM-CEP, Exercise Physiologist;Diamonique Ruedas Gerre Scull, RN, Fuller Plan, RT    Virtual Visit No    Medication changes reported     No    Fall or balance concerns reported    No    Tobacco Cessation No Change    Warm-up and Cool-down Performed as group-led instruction    Resistance Training Performed Yes    VAD Patient? No    PAD/SET Patient? No      Pain Assessment   Currently in Pain? No/denies    Multiple Pain Sites No             Capillary Blood Glucose: No results found for this or any previous visit (from the past 24 hours).    Social History   Tobacco Use  Smoking Status Former   Current packs/day: 0.00   Average packs/day: 0.1 packs/day for 3.0 years (0.3 ttl pk-yrs)   Types: Cigarettes   Start date: 10/22/1979   Quit date: 10/21/1982   Years since quitting: 41.0  Smokeless Tobacco Never    Goals Met:  Exercise tolerated well No report of concerns or symptoms today Strength training completed today  Goals Unmet:  Not Applicable  Comments: Service time is from 0816 to 0926    Dr. Mechele Collin is Medical Director for Pulmonary Rehab at [facility].

## 2023-10-21 ENCOUNTER — Encounter (HOSPITAL_COMMUNITY)
Admission: RE | Admit: 2023-10-21 | Discharge: 2023-10-21 | Disposition: A | Discharge status: Home / Self Care | Payer: Medicare Other | Source: Ambulatory Visit | Attending: Internal Medicine

## 2023-10-21 DIAGNOSIS — J849 Interstitial pulmonary disease, unspecified: Secondary | ICD-10-CM

## 2023-10-21 LAB — MULTIPLE MYELOMA PANEL, SERUM
Albumin SerPl Elph-Mcnc: 3.6 g/dL (ref 2.9–4.4)
Albumin/Glob SerPl: 1 (ref 0.7–1.7)
Alpha 1: 0.3 g/dL (ref 0.0–0.4)
Alpha2 Glob SerPl Elph-Mcnc: 0.9 g/dL (ref 0.4–1.0)
B-Globulin SerPl Elph-Mcnc: 1.1 g/dL (ref 0.7–1.3)
Gamma Glob SerPl Elph-Mcnc: 1.5 g/dL (ref 0.4–1.8)
Globulin, Total: 3.8 g/dL (ref 2.2–3.9)
IgA: 260 mg/dL (ref 61–437)
IgG (Immunoglobin G), Serum: 1650 mg/dL — ABNORMAL HIGH (ref 603–1613)
IgM (Immunoglobulin M), Srm: 120 mg/dL (ref 20–172)
M Protein SerPl Elph-Mcnc: 0.6 g/dL — ABNORMAL HIGH
Total Protein ELP: 7.4 g/dL (ref 6.0–8.5)

## 2023-10-21 NOTE — Progress Notes (Signed)
 Daily Session Note  Patient Details  Name: Samuel [name] MRN: [id number] Date of Birth: [birth year] Referring Provider:   Conrad Ports Pulmonary Rehab Walk Test from 10/06/2023 in [facility]  Referring Provider Ramaswamy       Encounter Date: 10/21/2023  Check In:  Session Check In - 10/21/23 9166       Check-In   Supervising physician immediately available to respond to emergencies [facility] MD immediately available    Physician(s) Orren Fabry, NP    [facility]    Staff Present Johnnie Moats, MS, ACSM-CEP, Exercise Physiologist;Amaria Mundorf Harvy, RN, Wetzel Sharps, RT    Virtual Visit No    Medication changes reported     No    Fall or balance concerns reported    No    Tobacco Cessation No Change    Warm-up and Cool-down Performed as group-led instruction    Resistance Training Performed Yes    VAD Patient? No    PAD/SET Patient? No      Pain Assessment   Currently in Pain? No/denies    Multiple Pain Sites No             Capillary Blood Glucose: No results found for this or any previous visit (from the past 24 hours).    Social History   Tobacco Use  Smoking Status Former   Current packs/day: 0.00   Average packs/day: 0.1 packs/day for 3.0 years (0.3 ttl pk-yrs)   Types: Cigarettes   Start date: 10/22/1979   Quit date: 10/21/1982   Years since quitting: 41.0  Smokeless Tobacco Never    Goals Met:  Exercise tolerated well No report of concerns or symptoms today Strength training completed today  Goals Unmet:  Not Applicable  Comments: Service time is from 0808 to 0920    Dr. Slater Staff is Medical Director for Pulmonary Rehab at [facility].

## 2023-10-23 ENCOUNTER — Encounter (HOSPITAL_COMMUNITY): Payer: Medicare Other

## 2023-10-27 ENCOUNTER — Ambulatory Visit: Payer: Medicare Other

## 2023-10-27 NOTE — Therapy (Deleted)
 OUTPATIENT SPEECH LANGUAGE PATHOLOGY VOICE TREATMENT    Patient Name: Samuel [name] MRN: [id number] DOB:August 12, 1956, 68 y.o., male Today's Date: 10/27/2023  PCP: Seabron Lenis, MD REFERRING PROVIDER: Seabron Lenis, MD  END OF SESSION:      Past Medical History:  Diagnosis Date   Asthma    GERD (gastroesophageal reflux disease)    High cholesterol    under control.    History of blood transfusion    Hypothyroidism    Interstitial lung disease (HCC)    Multiple myeloma (HCC)    Perennial allergic rhinitis    Pneumonia    Sciatic pain, right    Seasonal allergic rhinitis    Thyroid  disease    Past Surgical History:  Procedure Laterality Date   BRONCHIAL BIOPSY  06/18/2021   Procedure: BRONCHIAL BIOPSIES;  Surgeon: Shelah Lamar RAMAN, MD;  Location: [facility];  Service: Cardiopulmonary;;   BRONCHIAL BIOPSY  08/28/2021   Procedure: BRONCHIAL BIOPSIES;  Surgeon: Brenna Adine CROME, DO;  Location: [facility];  Service: Cardiopulmonary;;   BRONCHIAL WASHINGS  06/18/2021   Procedure: BRONCHIAL WASHINGS;  Surgeon: Shelah Lamar RAMAN, MD;  Location: [facility];  Service: Cardiopulmonary;;   BRONCHIAL WASHINGS  08/28/2021   Procedure: BRONCHIAL WASHINGS;  Surgeon: Brenna Adine CROME, DO;  Location: [facility];  Service: Cardiopulmonary;;   NASAL SINUS SURGERY     NECK SURGERY  1996   VIDEO BRONCHOSCOPY N/A 06/18/2021   Procedure: VIDEO BRONCHOSCOPY WITH FLUORO;  Surgeon: Shelah Lamar RAMAN, MD;  Location: [facility];  Service: Cardiopulmonary;  Laterality: N/A;   VIDEO BRONCHOSCOPY N/A 08/28/2021   Procedure: VIDEO BRONCHOSCOPY WITH FLUORO;  Surgeon: Brenna Adine CROME, DO;  Location: [facility];  Service: Cardiopulmonary;  Laterality: N/A;   Patient Active Problem List   Diagnosis Date Noted   Essential hypertension 07/04/2023   Coronary artery disease 11/28/2022   Mixed hyperlipidemia 11/28/2022   Recurrent sinusitis 11/04/2022   Acute bacterial rhinosinusitis 09/26/2022    Chronic respiratory failure with hypoxia (HCC) 08/01/2022   S/P bronchoscopy    ILD (interstitial lung disease) (HCC)    Preop cardiovascular exam 08/21/2021   Pneumonitis 07/20/2021   Anxiety 07/20/2021   Interstitial pulmonary disease (HCC) 07/20/2021   Abnormal CT of the chest 06/18/2021   Dyspnea 06/15/2021   Hypoxia 06/15/2021   Multiple myeloma (HCC) 03/05/2021   Multiple myeloma not having achieved remission (HCC) 03/05/2021   Bone lesion 02/14/2021   Monoclonal gammopathy 02/14/2021   High cholesterol    Allergic rhinitis 11/23/2020   Mild persistent asthma 11/23/2020   SOB (shortness of breath) 10/04/2011    Onset date: 05/15/23 (referral date)  REFERRING DIAG: R49.0 (ICD-10-CM) - Dysphonia  THERAPY DIAG: No diagnosis found.  Rationale for Evaluation and Treatment: Rehabilitation  SUBJECTIVE:   SUBJECTIVE STATEMENT: Enters with new onset hoarseness and overt SOB.  Accompanied by: self  PERTINENT HISTORY: Samuel [name] is a 68 y.o. male who presents as a return patient, for follow-up of sinusitis and globus sensation. At time of last visit on 03/14/2023, nasal culture was obtained following thick mucoid drainage noted in the right nasal cavity. This was abnormal, and demonstrated gram-positive bacilli. Patient was subsequently treated with a 2-week course of oral antibiotics, and presents today for posttreatment imaging. He has continued to use proton pump inhibitor twice daily, but continues to experience frequent throat clearing and globus sensation. He states he has been working on dietary and lifestyle modifications as previously instructed, and has been partially successful. He still has difficulty  with frequent throat clearing and needing to cough often. He also has not been able to obtain humidification for his oxygen, as the DME company stated they needed a prescription.  Last visit 03/14/2023: Samuel [name] is a 68 y.o. male who presents as a new consult, referred  by Malachy Crank [place] , FNP , for evaluation and treatment of sinus issues, hoarseness, and throat symptoms. He is accompanied by his wife.  Patient had history of multiple myeloma, status post treatment with chemotherapy. He developed interstitial lung disease following chemotherapeutic treatment, and is currently followed closely by pulmonology. He also endorses history of significant environmental allergies, and reports that recent allergy  testing showed positivity to everything. He was previously on immunotherapy, but this was held during his cancer treatment. He is considering starting back on the chemotherapy due to the significance of his symptoms. He is currently using Singulair , antihistamine and Patanase nasal spray on a daily basis. He also reports using nasal saline irrigations on a daily basis. He reports history of deviated nasal septum repair in 1983. He denies history of sinusitis requiring treatment with oral antibiotics in the last 12 months. His primary symptoms are nasal congestion and postnasal drainage. He denies facial pressure, pain, hyposmia, hypogeusia. He has not had any imaging of his head or sinuses. He does occasionally experience headaches. He uses oxygen on a nightly basis, and states that his oxygen is currently not humidified.  He reports symptoms of silent reflux, and states he is currently taking omeprazole  on a daily basis. [name] is frequent throat clearing and globus sensation. He also reports occasional hoarseness.  PAIN: Are you having pain? No  FALLS: Has patient fallen in last 6 months? No  LIVING ENVIRONMENT: Lives with: lives with their family Lives in: House/apartment  PLOF:Level of assistance: Independent with ADLs, Independent with IADLs Employment: Full-time employment  PATIENT GOALS: control my breath, control my speech, and relaxation   OBJECTIVE:   DIAGNOSTIC FINDINGS: ENT 04/04/23:  Nasolaryngoscopy performed at last visit  demonstrated moderate interarytenoid edema with otherwise normal upper airway exam. Counseled patient to continue twice daily omeprazole  for next 2-3 months. We discussed dietary lifestyle modifications for treatment of reflux to include avoiding spicy and acidic foods which exacerbate reflux, avoiding late meals and snacking, avoiding caffeine and stimulants, avoiding carbonated and alcoholic beverages, adequate oral hydration, avoidance of throat clearing and sleeping with the head of bed elevated. Recommended follow-up with gastroenterology if symptoms do not improve after 3 months of twice daily PPI. Offered referral to speech-language pathology due to continued hoarseness and frequent throat clearing, patient was agreeable, order placed today.   ENT 10/07/23 Chronic Cough and Throat Clearing Chronic cough and throat clearing, likely multifactorial including post-nasal drip, chronic GERD, and vocal fold atrophy. Symptoms exacerbated by interstitial lung disease and nasal cannula O2 requirement. Examination revealed pooling of secretions in pyriform sinuses concerning for dysphagia. 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  Discussed voice therapy as first-line therapy. Reviewed potential benefits and risks of current medications and new treatments such as steroid nasal rinses and switching antihistamines. - Order swallow study to evaluate for dysphagia (MBS esophagram) - Refer to speech therapy for voice therapy for vocal fold atrophy glottic insufficiency - Continue Azelastine  for chronic nasal congestion - Switch from Claritin to Zyrtec  - Add budesonide or mometasone to nasal rinses - Use Vaseline to prevent nasal dryness - Continue omeprazole  and reflux raft, especially at night - Continue Delsym and Mucinex as needed for cough  Chronic Nasal Congestion Post-Nasal Drip Post-nasal drip contributing to chronic cough and throat clearing. Examination showed  clear drainage in the back of the nose and nasal irritation likely due to allergies and O2 supplementation via nasal cannula. Discussed benefits of saline nasal rinses and potential for steroid nasal rinses to cover a larger surface area. Informed about slight risk of epistaxis with oxygen therapy and importance of using Vaseline to prevent nasal dryness. - Continue saline nasal rinses - Add budesonide or mometasone to nasal rinses - Switch from Claritin to Zyrtec  - Continue azelastine  2 puffs b/l nares twice daily - Use Vaseline to prevent nasal dryness   Chronic Gastroesophageal Reflux Disease (GERD) GERD contributing to chronic throat clearing and cough.  Discussed importance of taking omeprazole  and reflux raft, especially at night, and potential benefits of using reflux raft after large meals during the day. 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 Examination revealed age-appropriate thinning of the vocal folds but vocal folds were mobile without masses or lesions.  - Refer to speech therapy for voice therapy/    PATIENT REPORTED OUTCOME MEASURES (PROM): Communication Participation Item Bank: 9 quite a bit for all situations  but very much for getting turn in fast moving conversation  TODAY'S TREATMENT:                                                                                                                                         10/27/23:  10-08-23: Overt SOB and hoarseness exhibited this session, which was notably worse than last session. Recommended f/u with pulmonologist d/t persistent difficulty breathing impacting voice and overall functioning. Recently saw ENT and pulmonary rehab. Re-educated ENT results and recommendations, including  being increasingly intentional with reducing throat clearing and taking reflux raft for reflux management. Suggested gargling with water after inhaler use d/t SLP research re: dysphonia and inhalers. Plan to initiate new interventions, circumlaryngeal message and PhoRTE, next session pending pt respiratory baseline to address newly diagnosed vocal fold atrophy and supraglottic compression.   09-24-23: Visited ED on 09/12/23 d/t shortness of breath. Began course of antibiotics for suspected bronchitis/pneumonia and symptoms have resolved. Despite recent respiratory challenges, pt reports wanting to continue IMST exercises as he has been completing daily HEP and feels it is significantly improving respiratory strength. Additionally, a friend provided positive affirmation of voice strength and quality over 2 hour conversation. In previous session, SLP set device to 26.25 cm H20 based on MIP calculation and instructed pt not to adjust it, however the device was set to 33 cm H20 when pt entered. Reassessed MIP today across 3 trials: 80, 58, 75. Adjusted IMST device to 75% of MIP (80): 60 cm H20 (  SLP set to max setting: 41 cm H20). Pt demo'd 25 reps with rare min A. Reviewed reflux precautions as pt endorses increased globus sensation/congestion in throat. Pt would like f/u with ENT for second opinion; requested today. Recommended inquiring about pulmonary rehab at upcoming appointment d/t success with IMST thus far. Re-assess MIP next session.   09-08-23: Continues to c/o significant asthma impacting respiratory function and subsequently voice. Initiated education and instruction of Administrator, Arts (IMST) to address impaired breath support impacting vocal intensity and clarity. Measured MIP across 3 trials: 35, 29, 17. Modified provided IMST device to 75% of MIP (35): 26.25 cm H2O. Instructed patient on how to use device and perform recommended exercises (see pt instructions for HEP). Pt able to  demo 25 reps with occasional min A. Current device setting was deemed appropriate. Re-assess MIP next session.   08-26-23: Reports feeling under the weather during the past week and experiencing congestion from asthma today. SLP led discussion about preparing for work meetings occurring after ST. Recommended taking 5 to 10 minutes before meetings to reset breathing and prepare for prolonged speech. Pt identified x2 strategies to focus on during meeting with mod I. In 30 minute unstructured conversation, pt experienced occasional wet vocal quality, likely due to congestion, but independently self-corrected. Otherwise maintained strong breath support and clear vocal quality with rare min A and x3 cough/throat clears. Pt endorses overall improvements in conversational speech, but wants to continue targeting respiratory support.   08-12-23: Reports ongoing improvements in voice as well as breathing with less frequent cues provided by wife to use strategies. Continue to target use of diaphragmatic breathing, intentional upper body relaxation, and forward projection of voice in dual tasking scenarios. Pt aware of increased tension and reduced abdominal breathing during transition periods that include dividing attention between talking and other task (walking, answering phone). Focused on being intentional with relaxation and breath support to improve vocal quality and intensity during targeted dual tasking activities. Occasional min cues required to reduce tension, optimize breathing pattern, and project voice during these tasks.   08-06-23: Entered with hoarse vocal quality that improved independently, with pt stating that he focused on using forward resonance. Maintained relaxed appearance, strong breath support, and clear vocal quality during 20-minute unstructured conversation. SLP directed pt to walk down the hall and return to therapy room to read a short passage x2 to target increased breath support and clear  vocal quality in speech following exertion. Successfully completed task with occasional fading to rare min A, with improved breath support and vocal quality noted in the 2nd attempt. Benefited from cues to reset before beginning to read and take intentional pauses between sentences. Pt self-rated first attempt a 7.5 and second attempt an 8.5-9 out of 10. Addressed breath support during exertion by completing walking and talking task. Pt maintained structured conversation while completing laps around therapy gym with occasional mod fading to min A to take breaths in between sentences and stay relaxed. Increased success noted in additional trial.   07-29-23: Reported ongoing HEP completion. Endorsed overall improvement of SOVT exercises, although some days are better than others. Targeted SOVTE to reduce vocal tension and improve clarity. Completed sustained exhale, hum, and glides into water with occasional min A to reduce upper body tension and take reset breaths. Introduced straw talking exercise with short phrases to improve forward resonance. Used negative practice to alternate between back-focused voice and forward resonance to improve awareness of vocal quality. Accurately read aloud a list  of 8-9 word sentences using forward resonance with occasional min A. Back-focused voice more apparent at the end of sentences. Benefited from cues to break up long sentences with a pause and reset with a straw or hum.  07-23-23: Entered with clear vocal quality and reported completing SOVT HEP intermittently. Targeted SOVT exercises to reduce tension in vocal folds and improve vocal quality. Aware he can sustain exhale longer than before during exercises (averaged 6 sec for breath only). Completed hum, accents, and happy birthday into water given occasional fading to rare min A to relax upper body tension and increase breath support. Maintained 15 minute conversation with clear vocal quality and minimal upper body tension  independently. Pt endorsed increasing awareness of body tension in the community; however, his wife occasionally cues him to relax which causes frustration. SLP counseled pt that the strategies he is learning will take time to master in the community. No coughing or throat clearing observed throughout session.    PATIENT EDUCATION: Education details: see above  Person educated: Patient and Spouse Education method: Medical Illustrator Education comprehension: verbalized understanding, returned demonstration, and needs further education  HOME EXERCISE PROGRAM: Abdominal breathing, forward resonance  GOALS: Goals reviewed with patient? Yes  SHORT TERM GOALS: Target date: 06/23/2023  Pt will reduce throat clearing and coughing by 50% at STG date using trained techniques Baseline: > 9, 6 respectively; 1/0 at STG date Goal status: MET  2.  Pt will employ abdominal breathing during structured tasks with 80% accuracy given occasional min A  Baseline:  Goal status: MET  3.  Pt will employ abdominal breathing during structured conversation with 80% accuracy given occasional min A Baseline:  Goal status: MET  4.  Pt will maintain clear vocal quality in 5-10 minute structured conversations x2 given occasional min A  Baseline:  Goal status: MET    LONG TERM GOALS: Target date: 07/21/2023 (11/03/2023 for recert)   Pt will reduce throat clearing and coughing by 75% at LTG date using trained techniques Baseline:  Goal status: MET  2.  Pt will employ abdominal breathing during unstructured conversation with 80% accuracy given rare min A Baseline:  Goal status: MET  3.  Pt will maintain clear vocal quality in 15+ minute unstructured conversations x2 given rare min A  Baseline: 07-23-23, 08-06-23 Goal status: MET   4.  Pt will carryover trained voice techniques during community outings x2 with less frequent cues required by wife  Baseline: 08-12-23, 09-24-23 Goal status: MET  5.   Pt will complete IMST HEP with rare min A resulting in increased MIP at 4 weeks Baseline: MIP=26.25 Goal status: MET  6.  Pt will report improved communication effectiveness via PROM by 2 pts at last ST session  Baseline: CPIB=9 Goal status: IN PROGRESS (at recert)  ASSESSMENT:  CLINICAL IMPRESSION: Patient is a 68 y.o. M who was seen today for dysphonia. PMHX significant for Multiple Myeloma, interstitial lung disease, and GERD. Conducted ongoing education of IMST to optimize breath support and vocal quality. Reviewed reflux recommendations and throat clear alternatives d/t pt report of increased globus sensation/throat congestion. Due to new dx of VF atrophy and supraglottic compression, educate and train additional voice exercises next session pending respiratory baseline. Pt continuing to benefit from skilled ST intervention, such as training vocal hygiene protocol and voice techniques to optimize vocal quality and communication effectiveness.   OBJECTIVE IMPAIRMENTS: include voice disorder. These impairments are limiting patient from effectively communicating at home and in community. Factors  affecting potential to achieve goals and functional outcome are co-morbidities. Patient will benefit from skilled SLP services to address above impairments and improve overall function.  REHAB POTENTIAL: Good  PLAN:  SLP FREQUENCY: 2x/week (decrease to every other week)  SLP DURATION: 8 weeks (+ 8 additional weeks for recert)  PLANNED INTERVENTIONS: Language facilitation, Environmental controls, Cueing hierachy, Internal/external aids, Functional tasks, Multimodal communication approach, SLP instruction and feedback, Compensatory strategies, Patient/family education, and Re-evaluation    Comer LILLETTE Louder, CCC-SLP 10/27/2023, 7:46 AM

## 2023-10-28 ENCOUNTER — Ambulatory Visit: Payer: Medicare Other | Admitting: Adult Health

## 2023-10-28 ENCOUNTER — Encounter: Payer: Self-pay | Admitting: Adult Health

## 2023-10-28 ENCOUNTER — Telehealth (HOSPITAL_COMMUNITY): Payer: Self-pay | Admitting: *Deleted

## 2023-10-28 ENCOUNTER — Encounter (HOSPITAL_COMMUNITY): Payer: Medicare Other

## 2023-10-28 ENCOUNTER — Ambulatory Visit (INDEPENDENT_AMBULATORY_CARE_PROVIDER_SITE_OTHER): Payer: Medicare Other

## 2023-10-28 VITALS — BP 126/80 | Temp 97.4°F

## 2023-10-28 DIAGNOSIS — J849 Interstitial pulmonary disease, unspecified: Secondary | ICD-10-CM | POA: Diagnosis not present

## 2023-10-28 DIAGNOSIS — K219 Gastro-esophageal reflux disease without esophagitis: Secondary | ICD-10-CM

## 2023-10-28 DIAGNOSIS — J4531 Mild persistent asthma with (acute) exacerbation: Secondary | ICD-10-CM | POA: Diagnosis not present

## 2023-10-28 MED ORDER — PREDNISONE 10 MG PO TABS: ORAL_TABLET | ORAL | 0 refills | Status: DC

## 2023-10-28 NOTE — Telephone Encounter (Signed)
 Spoke with patient and wife . Advised Chest xray shows no sign of acute Pneumonia . Chronic ILD changes.  Please contact office for sooner follow up if symptoms do not improve or worsen or seek emergency care

## 2023-10-28 NOTE — Telephone Encounter (Signed)
 Jim left voice mail message, out today due to not feeling well.

## 2023-10-28 NOTE — Telephone Encounter (Signed)
 PT ret your call. Please try again @ [phone]

## 2023-10-28 NOTE — Patient Instructions (Addendum)
 Sputum culture.  Prednisone  20mg  daily for 1 week, then 10mg  daily for 2 weeks and 5mg  daily for 2 weeks and hold at this dose Liquid Mucinex Twice daily  As needed   Add Flutter valve Twice daily   Delsym 2 tsp Twice daily for cough As needed   Tessalon  Three times a day  for cough As needed   Continue on Breztri  2 puffs Twice daily  , rinse after use.  Albuterol  inhaler /neb As needed   Continue on Singulair  daily  Continue on pulmonary rehab  Swallow evaluation as planned.  Chest xray today  Continue on oxygen 3 L at rest and 8 L with exercise. Follow up with Dr. Geronimo in 3-4 weeks and As needed   Please contact office for sooner follow up if symptoms do not improve or worsen or seek emergency care

## 2023-10-28 NOTE — Progress Notes (Signed)
 @Patient  ID: Samuel [name], male    DOB: 12-16-1955, 68 y.o.   MRN: [id number]  Chief Complaint  Patient presents with   Follow-up   Discussed the use of AI scribe software for clinical note transcription with the patient, who gave verbal consent to proceed.  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  No BD 06/16/2021 echo: EF 65-70%, LVH. RV size and function nl 07/30/2021 HRCT chest:Mediastinal lymph nodes are decreased in size when compared to prior.  Central airways are patent.  Mild diffuse groundglass opacity with peribronchovascular and subpleural reticular glass opacities as well as traction bronchiectasis.  No clear craniocaudal gradient.  There is mild bilateral air trapping.  There is possible honeycomb change of the anterior left upper lobe.  There is a stable solid right middle lobe pulmonary nodule measuring 3 mm.  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 10/16/2021 PFT: FVC 29, FEV1 36, ratio 92, DLCO corrected 37 12/10/2021 PFT: FVC 32, FEV1 40, ratio 94, DLCO corrected 34 01/06/2022 CTA chest: No evidence of PE.  There is cardiomegaly and a small pericardial effusion.  Lung volumes are small.  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  No acute process noted. 06/06/2022 PFT: FVC 30, FEV1 36, ratio 88, DLCO corrected for alveolar volume 51 11/04/2022 PFT: FVC 28, FEV1 32, ratio 85, DLCOcor 37 High-resolution CT chest November 25, 2022 shows diffuse reticular opacities with associated traction bronchiectasis, no definite evidence of progression, previous diffuse groundglass opacities from October 2022 and August 2022 have resolved.   Findings are indeterminate for UIP   bronchoscopy performed in 06/18/2021.  Eosinophils 51%, lymphs 18%, no malignant cells identified patient was started on a long-term prednisone  taper. -Significant eosinophilia in the BAL the bronc is suggestive of drug-induced pneumonitis.    OFEV  stopped in 2022 -intolerant  Esbriet  stopped in October 2023 due to inability to tolerate due to GI side effects    # IgG Kappa Multiple Myeloma 02/22/2021: bone marrow biopsy confirms the diagnosis of Multiple Myeloma with a monoclonal plasma cell population.   10/28/2023 Follow up: ILD, Drug Induced Pneumonitis, O2 RF, Asthma  Patient presents for a work in visit for ongoing respiratory symptoms.  Patient is followed for interstitial lung disease.  History of drug-induced pneumonitis felt secondary to multiple myeloma drug treatment.  Over the last 4 months patient has had a slow progressive decline.  Has had decreased activity tolerance increased oxygen requirements.  Patient was seen in August with decline in lung function.  He was set up for a 2D echo that was completed on July 01, 2023 that showed preserved EF and grade 1 diastolic dysfunction.  In November patient had increased shortness of breath and increased oxygen requirements.  D-dimer was minimally elevated.  Flu and COVID swabs were negative.  BNP was normal.  White blood cell count was normal.  CT chest angio was negative for PE.  Showed mild diffuse groundglass opacities increased in the bases.  Stable lesions of the left sixth rib and left inferior scapula. He was treated with a Z-Pak and prednisone  taper with limited improvement.  Seen in virtual clinic on September 26, 2023 with some improvement and  recommended to continue on prednisone  taper.  He remains on Breztri  twice daily.  Singulair  daily.  He was referred to pulmonary rehab.  He was called in Levaquin  2 weeks ago for ongoing cough and congestion.  Cough is minimally productive.  He says it is hard  for him to get up any mucus.  Patient says he does feel some better when he is on prednisone . Patient has been seen by ENT and has been referred to speech therapy and has an upcoming swallow evaluation plan. Patient continues to have increased shortness of breath with minimum activities.  Has decreased activity tolerance.  Now uses oxygen at all times.  Currently on oxygen 3 L at rest.  Uses 8 L of oxygen with exercise.  O2 saturations decreased on POC/pulsing oxygen.  Patient is now unable to use POC if he walks for any amount of distance.  He continues to follow with oncology for his multiple myeloma.  The patient has been on oxygen therapy since September 2022, but the need for it has escalated recently. He reports that without the oxygen, he becomes winded very quickly. The patient also notes a feeling of mucus stuck in his throat, which he believes may be contributing to his breathing difficulties.  The patient has a history of COVID-19 infection, which he contracted three times in last couple of years. He reports that his lung function has never fully recovered since these infections.   The patient has tried antifibrotic medications-Esbriet  and Ofev  in the past, but he was unable to tolerate the side effects.           Allergies  Allergen Reactions   Clarithromycin Other (See Comments)    Hiccups   Ofev  [Nintedanib] Other (See Comments)    GI bleeding   Penicillins Other (See Comments)    Child hood unsure    Immunization History  Administered Date(s) Administered   Fluad Quad(high Dose 65+) 08/09/2021, 08/01/2022, 07/22/2023   Influenza Split 10/27/2017   Influenza, High Dose Seasonal PF 12/01/2017, 11/29/2019, 12/19/2020, 12/24/2021   Influenza, Quadrivalent, Recombinant, Inj, Pf 07/28/2019, 08/11/2020   Influenza-Unspecified 11/26/2011, 07/21/2017   PFIZER(Purple Top)SARS-COV-2 Vaccination 12/27/2019, 01/24/2020, 09/21/2020   Tdap 08/20/2005, 09/07/2015   Zoster, Live  11/06/2017, 05/07/2018    Past Medical History:  Diagnosis Date   Asthma    GERD (gastroesophageal reflux disease)    High cholesterol    under control.    History of blood transfusion    Hypothyroidism    Interstitial lung disease (HCC)    Multiple myeloma (HCC)    Perennial allergic rhinitis    Pneumonia    Sciatic pain, right    Seasonal allergic rhinitis    Thyroid  disease     Tobacco History: Social History   Tobacco Use  Smoking Status Former   Current packs/day: 0.00   Average packs/day: 0.1 packs/day for 3.0 years (0.3 ttl pk-yrs)   Types: Cigarettes   Start date: 10/22/1979   Quit date: 10/21/1982   Years since quitting: 41.0  Smokeless Tobacco Never   Counseling given: Not Answered   Outpatient Medications Prior to Visit  Medication Sig Dispense Refill   acyclovir  (ZOVIRAX ) 400 MG tablet Take 1 tablet (400 mg total) by mouth 2 (two) times daily. 60 tablet 3   albuterol  (PROVENTIL ) (2.5 MG/3ML) 0.083% nebulizer solution USE ONE VIAL IN NEBULIZER EVERY 6 HOURS AS NEEDED WHEEZING OR SHORTNESS OF BREATH 120 mL 12   albuterol  (VENTOLIN  HFA) 108 (90 Base) MCG/ACT inhaler Inhale 2  puffs into the lungs every 6 hours as needed for wheezing or shortness of breath. 8.5 g 1   ALPRAZolam  (XANAX ) 0.5 MG tablet Take 0.5 mg by mouth at bedtime.     ARMOUR THYROID  90 MG tablet Take 90 mg by mouth daily.     aspirin  EC 81 MG tablet Take 1 tablet (81 mg total) by mouth daily. Swallow whole. 90 tablet 3   ASSESS FULL RANGE PEAK METER DEVI as directed.     azelastine  (ASTELIN ) 0.1 % nasal spray Place 2 sprays into both nostrils 2 (two) times daily. Use in each nostril as directed 30 mL 12   azithromycin  (ZITHROMAX ) 250 MG tablet Take 2 tablets on day one, then one tablet, days 2-5. 6 tablet 0   bisoprolol  (ZEBETA ) 5 MG tablet Take 1 tablet (5 mg total) by mouth daily. 30 tablet 2   Budeson-Glycopyrrol-Formoterol (BREZTRI  AEROSPHERE) 160-9-4.8 MCG/ACT AERO Inhale 2 puffs into the  lungs in the morning and at bedtime. 10.7 g 11   cetirizine  (ZYRTEC ) 10 MG tablet Take 1 tablet (10 mg total) by mouth daily. 30 tablet 11   cholecalciferol (VITAMIN D) 1000 UNITS tablet Take 3,000 Units by mouth daily.     Dextromethorphan HBr (DELSYM PO) Take by mouth as needed. For cough     dextromethorphan-guaiFENesin (MUCINEX DM) 30-600 MG 12hr tablet Take 1 tablet by mouth 2 (two) times daily.     EPINEPHrine  (EPI-PEN) 0.3 mg/0.3 mL DEVI Inject 0.3 mg into the muscle as needed.     ezetimibe -simvastatin  (VYTORIN ) 10-40 MG tablet Take 1 tablet by mouth daily. 90 tablet 3   fluticasone  (FLONASE ) 50 MCG/ACT nasal spray 1 spray.     Guaifenesin 1200 MG TB12      hydrOXYzine  (ATARAX ) 25 MG tablet Take 25 mg by mouth every 8 (eight) hours as needed.     ibuprofen (ADVIL) 200 MG tablet Take 200 mg by mouth every 6 (six) hours as needed.     ketoconazole (NIZORAL) 2 % cream SMARTSIG:1 Application Topical 1 to 2 Times Daily     levofloxacin  (LEVAQUIN ) 500 MG tablet Take 1 tablet (500 mg total) by mouth daily. 7 tablet 0   levothyroxine  (SYNTHROID ) 50 MCG tablet Take 50 mcg by mouth daily before breakfast.     montelukast  (SINGULAIR ) 10 MG tablet Take 10 mg by mouth at bedtime.     Multiple Vitamin (MULTIVITAMIN) tablet Take 1 tablet by mouth daily.     omeprazole  (PRILOSEC) 40 MG capsule Take 1 capsule (40 mg total) by mouth 2 (two) times daily. Take 30 minutes before a meal 60 capsule 3   OXYGEN Inhale 2 L into the lungs continuous. As needed     sildenafil (REVATIO) 20 MG tablet Take 20 mg by mouth daily as needed (ED).     Simethicone  (SIMETHICONE  ULTRA STRENGTH) 180 MG CAPS Take 1 capsule (180 mg total) by mouth 3 (three) times daily as needed. 90 capsule 0   sodium chloride  (OCEAN) 0.65 % SOLN nasal spray Place 1 spray into both nostrils as needed for congestion.     tamsulosin  (FLOMAX ) 0.4 MG CAPS capsule Take 0.4 mg by mouth daily.     triamcinolone  cream (KENALOG ) 0.1 % Apply 1  application topically daily as needed (sun burn itch).     Turmeric 400 MG CAPS 1 capsule Orally once a day     UNABLE TO FIND Med Name: Allergy  injections once weekly     predniSONE  (DELTASONE ) 10 MG tablet 4  x 2 days, 2 x 2 days, 1 x 2 days, 1/2 x 2 days, then stop 15 tablet 0   predniSONE  (DELTASONE ) 10 MG tablet Take 40 mg once daily x 3 days, then 30 mg once daily x 3 days, then 20 mg once daily x 3 days, then 10 mg once daily x 3 days and go to baseline dose. 30 tablet 0   No facility-administered medications prior to visit.     Review of Systems:   Constitutional:   No  weight loss, night sweats,  Fevers, chills, +fatigue, or  lassitude.  HEENT:   No headaches,  Difficulty swallowing,  Tooth/dental problems, or  Sore throat,                No sneezing, itching, ear ache, nasal congestion, post nasal drip,   CV:  No chest pain,  Orthopnea, PND, swelling in lower extremities, anasarca, dizziness, palpitations, syncope.   GI  No heartburn, indigestion, abdominal pain, nausea, vomiting, diarrhea, change in bowel habits, loss of appetite, bloody stools.   Resp:  No chest wall deformity  Skin: no rash or lesions.  GU: no dysuria, change in color of urine, no urgency or frequency.  No flank pain, no hematuria   MS:  No joint pain or swelling.  No decreased range of motion.  No back pain.    Physical Exam  BP 126/80   Temp (!) 97.4 F (36.3 C)   GEN: A/Ox3; pleasant , NAD, elderly, on oxygen   HEENT:  [place]/AT,  EACs-clear, TMs-wnl, NOSE-clear, THROAT-clear, no lesions, no postnasal drip or exudate noted.   NECK:  Supple w/ fair ROM; no JVD; normal carotid impulses w/o bruits; no thyromegaly or nodules palpated; no lymphadenopathy.    RESP bibasilar crackles no accessory muscle use, no dullness to percussion  CARD:  RRR, no m/r/g, no peripheral edema, pulses intact, no cyanosis or clubbing.  GI:   Soft & nt; nml bowel sounds; no organomegaly or masses detected.   Musco:  Warm bil, no deformities or joint swelling noted.   Neuro: alert, no focal deficits noted.    Skin: Warm, no lesions or rashes    Lab Results:  CBC    Component Value Date/Time   WBC 5.5 10/08/2023 0817   WBC 5.6 09/12/2023 1743   RBC 4.95 10/08/2023 0817   HGB 15.4 10/08/2023 0817   HCT 45.1 10/08/2023 0817   PLT 202 10/08/2023 0817   MCV 91.1 10/08/2023 0817   MCH 31.1 10/08/2023 0817   MCHC 34.1 10/08/2023 0817   RDW 13.3 10/08/2023 0817   LYMPHSABS 1.3 10/08/2023 0817   MONOABS 0.4 10/08/2023 0817   EOSABS 0.8 (H) 10/08/2023 0817   BASOSABS 0.0 10/08/2023 0817    BMET    Component Value Date/Time   NA 133 (L) 10/08/2023 0817   K 4.4 10/08/2023 0817   CL 98 10/08/2023 0817   CO2 30 10/08/2023 0817   GLUCOSE 85 10/08/2023 0817   BUN 18 10/08/2023 0817   CREATININE 1.00 10/08/2023 0817   CALCIUM  9.4 10/08/2023 0817   GFRNONAA >60 10/08/2023 0817    BNP    Component Value Date/Time   BNP 32.3 06/15/2021 1416    ProBNP    Component Value Date/Time   PROBNP 35.0 09/12/2023 1018    Imaging: DG Chest 2 View Result Date: 10/28/2023 CLINICAL DATA:  Cough, dyspnea. EXAM: CHEST - 2 VIEW COMPARISON:  September 12, 2023.  August 28, 2021. FINDINGS: Stable cardiomediastinal silhouette.  Stable coarse interstitial densities are noted throughout both lungs consistent with history of chronic interstitial lung disease. No definite acute abnormality is noted. Bony thorax is unremarkable. IMPRESSION: Stable chronic lung opacities as noted above. Electronically Signed   By: Lynwood Landy Raddle M.D.   On: 10/28/2023 12:34    Administration History     None          Latest Ref Rng & Units 06/12/2023   11:30 AM 01/17/2023    2:09 PM 09/26/2022   10:34 AM 08/01/2022    1:57 PM 06/06/2022    1:52 PM 12/10/2021    9:24 AM 10/16/2021    3:56 PM  PFT Results  FVC-Pre L 1.19  1.36  1.32  1.55  1.43  1.49  1.35   FVC-Predicted Pre % 26  29  28   33  30  32  29   Pre FEV1/FVC %  % 88  90  85  94  88  94  92   FEV1-Pre L 1.05  1.23  1.12  1.46  1.26  1.40  1.24   FEV1-Predicted Pre % 30  36  32  42  36  40  36   DLCO uncorrected ml/min/mmHg 9.36  12.92  9.61  10.43  13.19  8.64  9.99   DLCO UNC% % 35  48  36  39  49  32  37   DLCO corrected ml/min/mmHg 9.36  13.27  9.96  10.58  13.77  9.20  10.04   DLCO COR %Predicted % 35  49  37  39  51  34  37   DLVA Predicted % 126  127  107  110  128  97  109     No results found for: NITRICOXIDE      Assessment & Plan:  Assessment and Plan    Interstitial Lung Disease (ILD)-patient has had clinical decline over the last 4 months.  PFTs showed decreased lung capacity.  2D echo showed some grade 1 diastolic dysfunction.  No significant pulmonary hypertension.  May need to consider referral for right heart cath if increased oxygen demands continue.  CT chest was negative for PE. He discontinued previous antifibrotic medications (Esbriet , Ofev ) due to  side effects.  For now we will place on a low-dose steroid taper.  Steroid patient education given.    Chronic respiratory failure.  Patient has increased oxygen demands.  Continue on oxygen 3 L continuous flow and 8 L with exercise.  Currently unable to tolerate POC device.  Have asked him to discuss with his DME company-looking into a portable continuous flow oxygen device-such as the Eclipse device that will go up to 3l/m continuous flow.   Chronic asthmatic bronchitis-slow to resolve exacerbation.  Will treat with a slow steroid patient education given.  Continue on triple therapy maintenance inhaler.  Add spacer to Breztri .  Add in flutter valve. Hold on additional antibiotics at this time.  Chest x-ray is pending.  Check sputum culture   Gastroesophageal Reflux Disease (GERD) GERD may be contributing to his throat mucus sensation and potential aspiration issues. We will continue his current GERD medication and evaluate the results of the upcoming swallow test and  additional tests with ENT and speech therapy.  Multiple Myeloma Continue follow-up with oncology General Health Maintenance He is managing complex health conditions with multiple specialists. We encourage regular follow-ups with all specialists, maintain current medications and therapies as prescribed, and ensure vaccinations are up to date.  Physical deconditioning.  Continue with pulmonary rehab as tolerated  Dysphagia-continue follow-up with ENT and swallow evaluation as planned continue with speech therapy   Follow-up We will follow up with Dr. Geronimo in 3-4 weeks, coordinate with pulmonary rehab and speech therapy, monitor oxygen levels and adjust therapy as needed, review the results of the chest x-ray and sputum culture, and evaluate the effectiveness of the prednisone  taper and other treatments.        I spent   51 minutes dedicated to the care of this patient on the date of this encounter to include pre-visit review of records, face-to-face time with the patient discussing conditions above, post visit ordering of testing, clinical documentation with the electronic health record, making appropriate referrals as documented, and communicating necessary findings to members of the patients care team.    Madelin Stank, NP 10/28/2023

## 2023-10-30 ENCOUNTER — Encounter (HOSPITAL_COMMUNITY)
Admission: RE | Admit: 2023-10-30 | Discharge: 2023-10-30 | Disposition: A | Discharge status: Home / Self Care | Payer: Medicare Other | Source: Ambulatory Visit | Attending: Internal Medicine | Admitting: Internal Medicine

## 2023-10-30 ENCOUNTER — Telehealth (HOSPITAL_COMMUNITY): Payer: Self-pay

## 2023-10-30 DIAGNOSIS — J849 Interstitial pulmonary disease, unspecified: Secondary | ICD-10-CM | POA: Insufficient documentation

## 2023-10-30 NOTE — Telephone Encounter (Signed)
 Received a message on voicemail that Samuel [name] was still not feeling well.

## 2023-11-02 NOTE — Therapy (Signed)
 OUTPATIENT SPEECH LANGUAGE PATHOLOGY VOICE TREATMENT    Patient Name: Samuel [name] MRN: [id number] DOB:03-06-1956, 68 y.o., male Today's Date: 11/03/2023  PCP: Seabron Lenis, MD REFERRING PROVIDER: Seabron Lenis, MD  END OF SESSION:  End of Session - 11/03/23 1015     Visit Number 22    Number of Visits 30    Date for SLP Re-Evaluation 12/29/23   recert for 8 weeks   Authorization Type medicare/ BCBS    SLP Start Time 1015    SLP Stop Time  1105    SLP Time Calculation (min) 50 min    Activity Tolerance Patient tolerated treatment well                Past Medical History:  Diagnosis Date   Asthma    GERD (gastroesophageal reflux disease)    High cholesterol    under control.    History of blood transfusion    Hypothyroidism    Interstitial lung disease (HCC)    Multiple myeloma (HCC)    Perennial allergic rhinitis    Pneumonia    Sciatic pain, right    Seasonal allergic rhinitis    Thyroid  disease    Past Surgical History:  Procedure Laterality Date   BRONCHIAL BIOPSY  06/18/2021   Procedure: BRONCHIAL BIOPSIES;  Surgeon: Shelah Lamar RAMAN, MD;  Location: [facility];  Service: Cardiopulmonary;;   BRONCHIAL BIOPSY  08/28/2021   Procedure: BRONCHIAL BIOPSIES;  Surgeon: Brenna Adine CROME, DO;  Location: [facility];  Service: Cardiopulmonary;;   BRONCHIAL WASHINGS  06/18/2021   Procedure: BRONCHIAL WASHINGS;  Surgeon: Shelah Lamar RAMAN, MD;  Location: [facility];  Service: Cardiopulmonary;;   BRONCHIAL WASHINGS  08/28/2021   Procedure: BRONCHIAL WASHINGS;  Surgeon: Brenna Adine CROME, DO;  Location: [facility];  Service: Cardiopulmonary;;   NASAL SINUS SURGERY     NECK SURGERY  1996   VIDEO BRONCHOSCOPY N/A 06/18/2021   Procedure: VIDEO BRONCHOSCOPY WITH FLUORO;  Surgeon: Shelah Lamar RAMAN, MD;  Location: [facility];  Service: Cardiopulmonary;  Laterality: N/A;   VIDEO BRONCHOSCOPY N/A 08/28/2021   Procedure: VIDEO BRONCHOSCOPY WITH FLUORO;  Surgeon: Brenna Adine CROME, DO;  Location: [facility];  Service: Cardiopulmonary;  Laterality: N/A;   Patient Active Problem List   Diagnosis Date Noted   Essential hypertension 07/04/2023   Coronary artery disease 11/28/2022   Mixed hyperlipidemia 11/28/2022   Recurrent sinusitis 11/04/2022   Acute bacterial rhinosinusitis 09/26/2022   Chronic respiratory failure with hypoxia (HCC) 08/01/2022   S/P bronchoscopy    ILD (interstitial lung disease) (HCC)    Preop cardiovascular exam 08/21/2021   Pneumonitis 07/20/2021   Anxiety 07/20/2021   Interstitial pulmonary disease (HCC) 07/20/2021   Abnormal CT of the chest 06/18/2021   Dyspnea 06/15/2021   Hypoxia 06/15/2021   Multiple myeloma (HCC) 03/05/2021   Multiple myeloma not having achieved remission (HCC) 03/05/2021   Bone lesion 02/14/2021   Monoclonal gammopathy 02/14/2021   High cholesterol    Allergic rhinitis 11/23/2020   Mild persistent asthma 11/23/2020   SOB (shortness of breath) 10/04/2011    Onset date: 05/15/23 (referral date)  REFERRING DIAG: R49.0 (ICD-10-CM) - Dysphonia  THERAPY DIAG: Other voice and resonance disorders  Dysphagia, unspecified type  Rationale for Evaluation and Treatment: Rehabilitation  SUBJECTIVE:   SUBJECTIVE STATEMENT: Entered with wife and portable oxygen.  Accompanied by: self  PERTINENT HISTORY: Samuel [name] is a 68 y.o. male who presents as a return patient, for follow-up of sinusitis and globus sensation.  At time of last visit on 03/14/2023, nasal culture was obtained following thick mucoid drainage noted in the right nasal cavity. This was abnormal, and demonstrated gram-positive bacilli. Patient was subsequently treated with a 2-week course of oral antibiotics, and presents today for posttreatment imaging. He has continued to use proton pump inhibitor twice daily, but continues to experience frequent throat clearing and globus sensation. He states he has been working on dietary and lifestyle  modifications as previously instructed, and has been partially successful. He still has difficulty with frequent throat clearing and needing to cough often. He also has not been able to obtain humidification for his oxygen, as the DME company stated they needed a prescription.  Last visit 03/14/2023: Samuel [name] is a 68 y.o. male who presents as a new consult, referred by Samuel [name] , FNP , for evaluation and treatment of sinus issues, hoarseness, and throat symptoms. He is accompanied by his wife.  Patient had history of multiple myeloma, status post treatment with chemotherapy. He developed interstitial lung disease following chemotherapeutic treatment, and is currently followed closely by pulmonology. He also endorses history of significant environmental allergies, and reports that recent allergy  testing showed positivity to everything. He was previously on immunotherapy, but this was held during his cancer treatment. He is considering starting back on the chemotherapy due to the significance of his symptoms. He is currently using Singulair , antihistamine and Patanase nasal spray on a daily basis. He also reports using nasal saline irrigations on a daily basis. He reports history of deviated nasal septum repair in 1983. He denies history of sinusitis requiring treatment with oral antibiotics in the last 12 months. His primary symptoms are nasal congestion and postnasal drainage. He denies facial pressure, pain, hyposmia, hypogeusia. He has not had any imaging of his head or sinuses. He does occasionally experience headaches. He uses oxygen on a nightly basis, and states that his oxygen is currently not humidified.  He reports symptoms of silent reflux, and states he is currently taking omeprazole  on a daily basis. [name] is frequent throat clearing and globus sensation. He also reports occasional hoarseness.  PAIN: Are you having pain? No  FALLS: Has patient fallen in last 6 months?  No  LIVING ENVIRONMENT: Lives with: lives with their family Lives in: House/apartment  PLOF:Level of assistance: Independent with ADLs, Independent with IADLs Employment: Full-time employment  PATIENT GOALS: control my breath, control my speech, and relaxation   OBJECTIVE:   DIAGNOSTIC FINDINGS: ENT 04/04/23:  Nasolaryngoscopy performed at last visit demonstrated moderate interarytenoid edema with otherwise normal upper airway exam. Counseled patient to continue twice daily omeprazole  for next 2-3 months. We discussed dietary lifestyle modifications for treatment of reflux to include avoiding spicy and acidic foods which exacerbate reflux, avoiding late meals and snacking, avoiding caffeine and stimulants, avoiding carbonated and alcoholic beverages, adequate oral hydration, avoidance of throat clearing and sleeping with the head of bed elevated. Recommended follow-up with gastroenterology if symptoms do not improve after 3 months of twice daily PPI. Offered referral to speech-language pathology due to continued hoarseness and frequent throat clearing, patient was agreeable, order placed today.   ENT 10/07/23 Chronic Cough and Throat Clearing Chronic cough and throat clearing, likely multifactorial including post-nasal drip, chronic GERD, and vocal fold atrophy. Symptoms exacerbated by interstitial lung disease and nasal cannula O2 requirement. Examination revealed pooling of secretions in pyriform sinuses concerning for dysphagia. 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  Discussed voice therapy as first-line therapy. Reviewed potential  benefits and risks of current medications and new treatments such as steroid nasal rinses and switching antihistamines. - Order swallow study to evaluate for dysphagia (MBS esophagram) - Refer to speech therapy for voice therapy for vocal fold atrophy glottic insufficiency - Continue Azelastine  for chronic nasal  congestion - Switch from Claritin to Zyrtec  - Add budesonide or mometasone to nasal rinses - Use Vaseline to prevent nasal dryness - Continue omeprazole  and reflux raft, especially at night - Continue Delsym and Mucinex as needed for cough   Chronic Nasal Congestion Post-Nasal Drip Post-nasal drip contributing to chronic cough and throat clearing. Examination showed clear drainage in the back of the nose and nasal irritation likely due to allergies and O2 supplementation via nasal cannula. Discussed benefits of saline nasal rinses and potential for steroid nasal rinses to cover a larger surface area. Informed about slight risk of epistaxis with oxygen therapy and importance of using Vaseline to prevent nasal dryness. - Continue saline nasal rinses - Add budesonide or mometasone to nasal rinses - Switch from Claritin to Zyrtec  - Continue azelastine  2 puffs b/l nares twice daily - Use Vaseline to prevent nasal dryness   Chronic Gastroesophageal Reflux Disease (GERD) GERD contributing to chronic throat clearing and cough.  Discussed importance of taking omeprazole  and reflux raft, especially at night, and potential benefits of using reflux raft after large meals during the day. 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 Examination revealed age-appropriate thinning of the vocal folds but vocal folds were mobile without masses or lesions.  - Refer to speech therapy for voice therapy/    PATIENT REPORTED OUTCOME MEASURES (PROM): Communication Participation Item Bank: 9 quite a bit for all situations  but very much for getting turn in fast moving conversation  TODAY'S TREATMENT:                                                                                                                                          11/03/23: Entered with portable oxygen (pulse; 3 L). Wife, [name], present this session. Desaturated to 87% following short walk to SLP office. Steadily rose to 91% and 98% given rest break. Voice c/b hoarse, strained quality with dysfluent rate associated with impaired breath support. Did mildly improve throughout session; however, limited maintenance noted d/t impaired breath support. Continues to complain of throat congestion being main barrier to improved voicing. Dicussed previously recommended vocal hygiene practices, including reduction of throat clearing, increased hydration to thin mucous, and strict adherence to reflux recommendations. Inconsistent but increasing carryover of recommendations reported over last week, with benefit. Educated patient on vocal impact of vocal cord atrophy and muscle tension dysphonia on current voice, with compounding factors  of impaired breath support and reflux. Scheduled for MBSS in two days per patient report of dysphagia to ENT. Will review documentation and recommendations and modify POC accordingly. May benefit from FEES d/t pt complaint of pharyngeal congestion. Recert completed d/t decline in speech and recent concern for dysphagia.   10-08-23: Overt SOB and hoarseness exhibited this session, which was notably worse than last session. Recommended f/u with pulmonologist d/t persistent difficulty breathing impacting voice and overall functioning. Recently saw ENT and pulmonary rehab. Re-educated ENT results and recommendations, including being increasingly intentional with reducing throat clearing and taking reflux raft for reflux management. Suggested gargling with water after inhaler use d/t SLP research re: dysphonia and inhalers. Plan to initiate new interventions, circumlaryngeal message and PhoRTE, next session pending pt respiratory baseline to address newly diagnosed vocal fold atrophy and supraglottic  compression.   09-24-23: Visited ED on 09/12/23 d/t shortness of breath. Began course of antibiotics for suspected bronchitis/pneumonia and symptoms have resolved. Despite recent respiratory challenges, pt reports wanting to continue IMST exercises as he has been completing daily HEP and feels it is significantly improving respiratory strength. Additionally, a friend provided positive affirmation of voice strength and quality over 2 hour conversation. In previous session, SLP set device to 26.25 cm H20 based on MIP calculation and instructed pt not to adjust it, however the device was set to 33 cm H20 when pt entered. Reassessed MIP today across 3 trials: 80, 58, 75. Adjusted IMST device to 75% of MIP (80): 60 cm H20 (SLP set to max setting: 41 cm H20). Pt demo'd 25 reps with rare min A. Reviewed reflux precautions as pt endorses increased globus sensation/congestion in throat. Pt would like f/u with ENT for second opinion; requested today. Recommended inquiring about pulmonary rehab at upcoming appointment d/t success with IMST thus far. Re-assess MIP next session.   09-08-23: Continues to c/o significant asthma impacting respiratory function and subsequently voice. Initiated education and instruction of Administrator, Arts (IMST) to address impaired breath support impacting vocal intensity and clarity. Measured MIP across 3 trials: 35, 29, 17. Modified provided IMST device to 75% of MIP (35): 26.25 cm H2O. Instructed patient on how to use device and perform recommended exercises (see pt instructions for HEP). Pt able to demo 25 reps with occasional min A. Current device setting was deemed appropriate. Re-assess MIP next session.   08-26-23: Reports feeling under the weather during the past week and experiencing congestion from asthma today. SLP led discussion about preparing for work meetings occurring after ST. Recommended taking 5 to 10 minutes before meetings to reset breathing and prepare  for prolonged speech. Pt identified x2 strategies to focus on during meeting with mod I. In 30 minute unstructured conversation, pt experienced occasional wet vocal quality, likely due to congestion, but independently self-corrected. Otherwise maintained strong breath support and clear vocal quality with rare min A and x3 cough/throat clears. Pt endorses overall improvements in conversational speech, but wants to continue targeting respiratory support.   08-12-23: Reports ongoing improvements in voice as well as breathing with less frequent cues provided by wife to use strategies. Continue to target use of diaphragmatic breathing, intentional upper body relaxation, and forward projection of voice in dual tasking scenarios. Pt aware of increased tension and reduced abdominal breathing during transition periods that include dividing attention between talking and other task (walking, answering phone). Focused on being intentional with relaxation and breath support to improve vocal quality and intensity during targeted dual tasking activities.  Occasional min cues required to reduce tension, optimize breathing pattern, and project voice during these tasks.   08-06-23: Entered with hoarse vocal quality that improved independently, with pt stating that he focused on using forward resonance. Maintained relaxed appearance, strong breath support, and clear vocal quality during 20-minute unstructured conversation. SLP directed pt to walk down the hall and return to therapy room to read a short passage x2 to target increased breath support and clear vocal quality in speech following exertion. Successfully completed task with occasional fading to rare min A, with improved breath support and vocal quality noted in the 2nd attempt. Benefited from cues to reset before beginning to read and take intentional pauses between sentences. Pt self-rated first attempt a 7.5 and second attempt an 8.5-9 out of 10. Addressed breath support  during exertion by completing walking and talking task. Pt maintained structured conversation while completing laps around therapy gym with occasional mod fading to min A to take breaths in between sentences and stay relaxed. Increased success noted in additional trial.   07-29-23: Reported ongoing HEP completion. Endorsed overall improvement of SOVT exercises, although some days are better than others. Targeted SOVTE to reduce vocal tension and improve clarity. Completed sustained exhale, hum, and glides into water with occasional min A to reduce upper body tension and take reset breaths. Introduced straw talking exercise with short phrases to improve forward resonance. Used negative practice to alternate between back-focused voice and forward resonance to improve awareness of vocal quality. Accurately read aloud a list of 8-9 word sentences using forward resonance with occasional min A. Back-focused voice more apparent at the end of sentences. Benefited from cues to break up long sentences with a pause and reset with a straw or hum.  07-23-23: Entered with clear vocal quality and reported completing SOVT HEP intermittently. Targeted SOVT exercises to reduce tension in vocal folds and improve vocal quality. Aware he can sustain exhale longer than before during exercises (averaged 6 sec for breath only). Completed hum, accents, and happy birthday into water given occasional fading to rare min A to relax upper body tension and increase breath support. Maintained 15 minute conversation with clear vocal quality and minimal upper body tension independently. Pt endorsed increasing awareness of body tension in the community; however, his wife occasionally cues him to relax which causes frustration. SLP counseled pt that the strategies he is learning will take time to master in the community. No coughing or throat clearing observed throughout session.    PATIENT EDUCATION: Education details: see above  Person  educated: Patient and Spouse Education method: Medical Illustrator Education comprehension: verbalized understanding, returned demonstration, and needs further education  HOME EXERCISE PROGRAM: Abdominal breathing, forward resonance  GOALS: Goals reviewed with patient? Yes  SHORT TERM GOALS: Target date: 06/23/2023 (12/01/2023 for recert)  Pt will reduce throat clearing and coughing by 50% at STG date using trained techniques Baseline: > 9, 6 respectively; 1/0 at STG date Goal status: MET  2.  Pt will employ abdominal breathing during structured tasks with 80% accuracy given occasional min A  Baseline:  Goal status: MET  3.  Pt will employ abdominal breathing during structured conversation with 80% accuracy given occasional min A Baseline:  Goal status: MET  4.  Pt will maintain clear vocal quality in 5-10 minute structured conversations x2 given occasional min A  Baseline:  Goal status: MET  5.  Pt will carryover recommendations and complete HEP as recommended x3 sessions  Baseline:  Goal status:  NEW (at recert)  6.  Pt will achieve clear vocal clarity with use of trained techniques in short, structured conversations 3-5 mins x2 sessions  Baseline:  Goal status: NEW (at recert)  7.  Pt will demonstrate improved swallow function with recommended strategies and/or exercises given rare min A x2 sessions Baseline:  Goal status: NEW  (at recert)    LONG TERM GOALS: Target date: 07/21/2023 (12/29/2023 for recert)   Pt will reduce throat clearing and coughing by 75% at LTG date using trained techniques Baseline:  Goal status: MET  2.  Pt will employ abdominal breathing during unstructured conversation with 80% accuracy given rare min A Baseline:  Goal status: MET  3.  Pt will maintain clear vocal quality in 15+ minute unstructured conversations x2 given rare min A  Baseline: 07-23-23, 08-06-23 Goal status: MET   4.  Pt will carryover trained voice techniques  during community outings x2 with less frequent cues required by wife  Baseline: 08-12-23, 09-24-23 Goal status: MET  5.  Pt will complete IMST HEP with rare min A resulting in increased MIP at 4 weeks Baseline: MIP=26.25 Goal status: MET  6.  Pt will achieve clear vocal clarity with use of trained techniques in unstructured conversations 10+ minutes x2 session  Baseline:  Goal status: NEW (at recert)  7.  Pt will report improved communication effectiveness via PROM by 2 pts at last ST session  Baseline: CPIB=9 Goal status: IN PROGRESS (at recert)  8.  Pt will report improved swallow function with recommended strategies and/or exercises at home with mod I Baseline:  Goal status: NEW (at recert)  ASSESSMENT:  CLINICAL IMPRESSION: Patient is a 68 y.o. M who was seen today for dysphonia. PMHX significant for Multiple Myeloma, interstitial lung disease, and GERD. Recent decline in health has impacted vocal clarity, respiratory support for talking and swallowing, and recent reports of occasional dysphagia. MBSS scheduled this week; updated POC based on results. Due to recent dx of VF atrophy and supraglottic compression, pt would benefit from additional education and training of additional voice exercises to optimize vocal quality. Pt continuing to benefit from skilled ST intervention, such as training vocal hygiene protocol and voice techniques to optimize vocal quality and communication effectiveness.   OBJECTIVE IMPAIRMENTS: include voice disorder. These impairments are limiting patient from effectively communicating at home and in community. Factors affecting potential to achieve goals and functional outcome are co-morbidities. Patient will benefit from skilled SLP services to address above impairments and improve overall function.  REHAB POTENTIAL: Good  PLAN:  SLP FREQUENCY: 1-2x/week  SLP DURATION: 8 weeks (+ 8 additional weeks for recert)  PLANNED INTERVENTIONS: Language  facilitation, Environmental controls, Cueing hierachy, Internal/external aids, Functional tasks, Multimodal communication approach, SLP instruction and feedback, Compensatory strategies, Patient/family education, and Re-evaluation    Comer LILLETTE Louder, CCC-SLP 11/03/2023, 11:06 AM

## 2023-11-03 ENCOUNTER — Telehealth (HOSPITAL_COMMUNITY): Payer: Self-pay

## 2023-11-03 ENCOUNTER — Ambulatory Visit: Payer: Medicare Other | Attending: Family Medicine

## 2023-11-03 DIAGNOSIS — R498 Other voice and resonance disorders: Secondary | ICD-10-CM | POA: Diagnosis present

## 2023-11-03 DIAGNOSIS — R1310 Dysphagia, unspecified: Secondary | ICD-10-CM | POA: Insufficient documentation

## 2023-11-03 NOTE — Telephone Encounter (Signed)
ATC patient x1.  LVM to return call. 

## 2023-11-03 NOTE — Telephone Encounter (Signed)
 Please see office note 10/28/23 regarding O2 use .  Please call to see if he needs office visit .

## 2023-11-03 NOTE — Patient Instructions (Addendum)
 ENT recommendations:  - Continue reflux raft, especially at night - Consider using reflux raft after large meals during the day    Semi-occluded vocal tract exercises (SOVTE)  These allow your vocal folds to vibrate without excess tension and promotes high placement of the voice  Use SOVTE as a warm up before prolonged speaking and vocal exercises  Watch Vocal Straw Exercises with Danise Pacer on YouTube: [email or web address]  Make sure your lips are rounded and sealed around straw   Exercises: (2-3x each exercise)  Blow air through straw  Hum through straw  Hum (low to high pitch) through straw  Hum (high to low pitch) through straw   Hum "autonation" through [employer] "happy birthday" through [employer] air through straw into water  Hum through straw into water  Hum (low to high pitch) through straw into water  Hum (high to low pitch) through straw into water   Hum "autonation" through straw into water   Hum "happy birthday" through straw into water   A goal would be 2-3 minutes several times a day and prior to vocal exercises  As always, use good belly breathing while completing SOVTE

## 2023-11-03 NOTE — Telephone Encounter (Signed)
 Samuel [name] called the Pulmonary Rehab office today stating he needed advice for his continuing shortness of breath. Speaking to Samuel [name] he had broken sentences due to his shortness of breath. RN advised pt to go to Urgent Care/ER, but pt refused. Stated he was worse last week and saw pulmonary who placed him on steroids.   Samuel [name] disclosed that he wears his POC on 3L (which is max) and drops to the 60s while walking. Advised of dangerous situation and reiterated to him the need for tanks. Informed him I would contact his pulmonologist for an order but Samuel [name] stated he did not want to give up the POC. Samuel [name] stated he uses his concentrator at home which goes up to 10L. Pt needs 8L with exertion at [facility].   Pt reported that his sentence structure is related to his larynx and not illness. Stated that he saw speech therapy earlier this AM. Pt stated that his wife needs to carry his POC for him because the extra exertion makes his oxygen drop lower.   Again, advised pt to go to urgent care/ER for assessment. Will place on medical hold from Pulmonary Rehab until symptoms resolve. Informed pt that I would forward note to pulm for any other recommendations.

## 2023-11-04 ENCOUNTER — Encounter (HOSPITAL_COMMUNITY): Admission: RE | Admit: 2023-11-04 | Payer: Medicare Other | Source: Ambulatory Visit

## 2023-11-05 ENCOUNTER — Ambulatory Visit (HOSPITAL_COMMUNITY)
Admission: RE | Admit: 2023-11-05 | Discharge: 2023-11-05 | Disposition: A | Discharge status: Home / Self Care | Payer: Medicare Other | Source: Ambulatory Visit | Attending: *Deleted | Admitting: *Deleted

## 2023-11-05 ENCOUNTER — Ambulatory Visit (HOSPITAL_COMMUNITY)
Admission: RE | Admit: 2023-11-05 | Discharge: 2023-11-05 | Disposition: A | Discharge status: Home / Self Care | Payer: Medicare Other | Source: Ambulatory Visit | Attending: Otolaryngology

## 2023-11-05 DIAGNOSIS — R1310 Dysphagia, unspecified: Secondary | ICD-10-CM | POA: Diagnosis not present

## 2023-11-05 DIAGNOSIS — R09A2 Foreign body sensation, throat: Secondary | ICD-10-CM | POA: Diagnosis not present

## 2023-11-05 DIAGNOSIS — C9000 Multiple myeloma not having achieved remission: Secondary | ICD-10-CM | POA: Insufficient documentation

## 2023-11-05 DIAGNOSIS — R059 Cough, unspecified: Secondary | ICD-10-CM

## 2023-11-05 DIAGNOSIS — R053 Chronic cough: Secondary | ICD-10-CM | POA: Insufficient documentation

## 2023-11-05 DIAGNOSIS — J849 Interstitial pulmonary disease, unspecified: Secondary | ICD-10-CM | POA: Diagnosis present

## 2023-11-05 DIAGNOSIS — R0989 Other specified symptoms and signs involving the circulatory and respiratory systems: Secondary | ICD-10-CM | POA: Diagnosis not present

## 2023-11-05 DIAGNOSIS — K219 Gastro-esophageal reflux disease without esophagitis: Secondary | ICD-10-CM | POA: Diagnosis not present

## 2023-11-05 NOTE — Evaluation (Signed)
 Modified Barium Swallow Study  Patient Details  Name: Samuel [name] MRN: [id number] Date of Birth: 20-Jan-1956  Today's Date: 11/05/2023  Modified Barium Swallow completed.  Full report located under Chart Review in the Imaging Section.  History of Present Illness Pt is a 68 yo male referred for OP MBS by ENT. 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 There was also pooling of secretions in his pyriform sinuses (L>R). He has been working with OP SLP for dysphonia, frequent throat clearing. He feels like he is getting improvement using his IMST specifically. PMH includes: ILD (intermittent use of oxygen, has been using more consistently lately), globus sensation, chronic cough, GERD, multiple myeloma, former smoker, neck surgery, allergies   Clinical Impression Pt's oropharyngeal swallow is functional with adequate efficiency and safety. He has trace amounts of residue in his pyriform sinuses after the swallow, noted more with liquids than with solids. He does not have a lot of anterior hyoid excursion but he has no other overt pharyngeal deficits. No penetration or aspiration observed. Note that pt did report feeling like the purees and solids got stuck in his throat, which elicited coughing and throat clearing in an attempt to clear. When correlated with imaging, there was no residual in his pharynx. Question a referrred sensation and primary esophageal component (pt having esophagram after this study). Would continue regular solids and thin liquids with use of esophageal precautions. Would defer f/u to primary OP SLP.  Factors that may increase risk of adverse event in presence of aspiration Roderick Civatte & Jessy Morocco 2021): Respiratory or GI disease  Swallow Evaluation Recommendations Recommendations: PO diet PO Diet Recommendation: Regular;Thin liquids (Level 0) Liquid Administration via: Cup;Straw Medication Administration: Whole meds  with liquid Supervision: Patient able to self-feed Swallowing strategies  : Slow rate;Small bites/sips;Follow solids with liquids Postural changes: Position pt fully upright for meals;Stay upright 30-60 min after meals Oral care recommendations: Oral care BID (2x/day)      Beth Brooke., M.A. CCC-SLP Acute Rehabilitation Services Office [phone]  Secure chat preferred  11/05/2023,1:51 PM

## 2023-11-06 ENCOUNTER — Inpatient Hospital Stay: Payer: Medicare Other | Admitting: Hematology and Oncology

## 2023-11-06 ENCOUNTER — Inpatient Hospital Stay: Payer: Medicare Other | Attending: Hematology and Oncology

## 2023-11-06 VITALS — BP 124/88 | HR 63 | Temp 98.2°F | Resp 18 | Wt 205.9 lb

## 2023-11-06 DIAGNOSIS — Z7969 Long term (current) use of other immunomodulators and immunosuppressants: Secondary | ICD-10-CM | POA: Diagnosis not present

## 2023-11-06 DIAGNOSIS — C9000 Multiple myeloma not having achieved remission: Secondary | ICD-10-CM | POA: Diagnosis present

## 2023-11-06 DIAGNOSIS — Z87891 Personal history of nicotine dependence: Secondary | ICD-10-CM | POA: Insufficient documentation

## 2023-11-06 DIAGNOSIS — D472 Monoclonal gammopathy: Secondary | ICD-10-CM

## 2023-11-06 DIAGNOSIS — Z7952 Long term (current) use of systemic steroids: Secondary | ICD-10-CM | POA: Insufficient documentation

## 2023-11-06 DIAGNOSIS — Z79899 Other long term (current) drug therapy: Secondary | ICD-10-CM | POA: Insufficient documentation

## 2023-11-06 DIAGNOSIS — Z7961 Long term (current) use of immunomodulator: Secondary | ICD-10-CM | POA: Insufficient documentation

## 2023-11-06 DIAGNOSIS — Z7982 Long term (current) use of aspirin: Secondary | ICD-10-CM | POA: Insufficient documentation

## 2023-11-06 DIAGNOSIS — Z79624 Long term (current) use of inhibitors of nucleotide synthesis: Secondary | ICD-10-CM | POA: Insufficient documentation

## 2023-11-06 LAB — CBC WITH DIFFERENTIAL (CANCER CENTER ONLY)
Abs Immature Granulocytes: 0.05 10*3/uL (ref 0.00–0.07)
Basophils Absolute: 0 10*3/uL (ref 0.0–0.1)
Basophils Relative: 0 %
Eosinophils Absolute: 0.8 10*3/uL — ABNORMAL HIGH (ref 0.0–0.5)
Eosinophils Relative: 8 %
HCT: 39.8 % (ref 39.0–52.0)
Hemoglobin: 13.6 g/dL (ref 13.0–17.0)
Immature Granulocytes: 1 %
Lymphocytes Relative: 21 %
Lymphs Abs: 2.1 10*3/uL (ref 0.7–4.0)
MCH: 30.4 pg (ref 26.0–34.0)
MCHC: 34.2 g/dL (ref 30.0–36.0)
MCV: 88.8 fL (ref 80.0–100.0)
Monocytes Absolute: 0.8 10*3/uL (ref 0.1–1.0)
Monocytes Relative: 9 %
Neutro Abs: 6.1 10*3/uL (ref 1.7–7.7)
Neutrophils Relative %: 61 %
Platelet Count: 249 10*3/uL (ref 150–400)
RBC: 4.48 MIL/uL (ref 4.22–5.81)
RDW: 12.7 % (ref 11.5–15.5)
WBC Count: 9.9 10*3/uL (ref 4.0–10.5)
nRBC: 0 % (ref 0.0–0.2)

## 2023-11-06 LAB — CMP (CANCER CENTER ONLY)
ALT: 18 U/L (ref 0–44)
AST: 23 U/L (ref 15–41)
Albumin: 4 g/dL (ref 3.5–5.0)
Alkaline Phosphatase: 58 U/L (ref 38–126)
Anion gap: 6 (ref 5–15)
BUN: 18 mg/dL (ref 8–23)
CO2: 31 mmol/L (ref 22–32)
Calcium: 9.3 mg/dL (ref 8.9–10.3)
Chloride: 97 mmol/L — ABNORMAL LOW (ref 98–111)
Creatinine: 0.9 mg/dL (ref 0.61–1.24)
GFR, Estimated: >60 mL/min (ref >60)
Glucose, Bld: 89 mg/dL (ref 70–99)
Potassium: 3.6 mmol/L (ref 3.5–5.1)
Sodium: 134 mmol/L — ABNORMAL LOW (ref 135–145)
Total Bilirubin: 0.4 mg/dL (ref 0.0–1.2)
Total Protein: 7.7 g/dL (ref 6.5–8.1)

## 2023-11-06 LAB — LACTATE DEHYDROGENASE: LDH: 247 U/L — ABNORMAL HIGH (ref 98–192)

## 2023-11-06 NOTE — Progress Notes (Signed)
[facility] [phone]   [phone]  ONCOLOGY PROGRESS NOTE  Patient Care Team: Tally Joe, MD as PCP - General (Family Medicine)  Hematological/Oncological History # IgG Kappa Multiple Myeloma 02/02/2021:  Presented to [facility] due to right sided flank tenderness with bruising. CT abdomen/pelvis: Multiple small lytic lesions in the thoracolumbar spine and bilateral pelvis -SPEP: IgG 2,082 (H), M Protein 1.8 (H). IFE shows IgG monoclonal protein with kappa light chain specificity.  -LDH 169, CBC normal, CMP normal except for sodium 131 (L), Chloride 96 (L).   02/14/2021: Establish care with Georga Kaufmann PA-C 02/22/2021: bone marrow biopsy confirms the diagnosis of Multiple Myeloma with a monoclonal plasma cell population.  03/16/2021: Cycle 1 Day 1 of VRd chemotherapy  04/06/2021: Cycle 2 Day 1 of VRd chemotherapy  04/27/2021: Cycle 3 Day 1 of VRd chemotherapy  05/18/2021: Cycle 4 Day 1 of VRd chemotherapy  06/01/2021: drop dexamethasone to 20mg  PO weekly and start lasix due to shortness of breath.  06/15/2021: Desaturation to 87% on ambulation. HELD velcade today and sent to ED for evaluation.  06/22/2021: Findings consistent with drug reaction the lungs with eosinophils on BAL.  Given these findings we will definitely hold [name] and plan to avoid pomalidomide 06/29/2021: Cycle 1 Day 1 CyBorD chemotherapy 07/05/2021: HOLD chemotherapy given worsening lung function  Interval History:  Samuel [name] 68 y.o. male with medical history significant for IgG Kappa multiple myeloma who presents for a follow up visit. The patient's last visit was on 06/16/2023. He is accompanied by his wife for this visit.  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  He reports that over the last 4 to 6 weeks he has had a tough flare of his lungs were his producing more mucus and having a  cough.  He notes he had had a laryngoscopy with ENT and noted that there was some inflammation/irritation of his vocal cords, most likely due to coughing.  He reports his oxygen levels are typically pretty good at home but with exertion can sometimes drop down considerably.  He does have home portable oxygen.  His oxygen sat today was less than 90% on arrival he was put on 2 L of O2 in the clinic.  He reports that he is continuing to work with pulmonology in order to get to the root of why he is had this recent worsening.  He was seen in the emergency department.  He has been working with speech therapy and had been improving but with this recent worsening he notes he "fell off a cliff".  He notes his weight has been steady.  He denies fevers, chills, night sweats, chest pain or cough. He has no other complaints. Rest of the 10 point ROS is listed below.  Today we had a detailed discussion about steps moving forward.  We noted that his M protein is elevated and he does require start of treatment at some point, however given his unstable lung function this would be too risky to begin at this time.  He voices understanding and wanted to continue monitoring at this time.   MEDICAL HISTORY:  Past Medical History:  Diagnosis Date   Asthma    GERD (gastroesophageal reflux disease)    High cholesterol    under control.    History of blood transfusion    Hypothyroidism    Interstitial lung disease (HCC)  Multiple myeloma (HCC)    Perennial allergic rhinitis    Pneumonia    Sciatic pain, right    Seasonal allergic rhinitis    Thyroid disease     SURGICAL HISTORY: Past Surgical History:  Procedure Laterality Date   BRONCHIAL BIOPSY  06/18/2021   Procedure: BRONCHIAL BIOPSIES;  Surgeon: Leslye Peer, MD;  Location: [facility];  Service: Cardiopulmonary;;   BRONCHIAL BIOPSY  08/28/2021   Procedure: BRONCHIAL BIOPSIES;  Surgeon: Josephine Igo, DO;  Location: [facility];  Service:  Cardiopulmonary;;   BRONCHIAL WASHINGS  06/18/2021   Procedure: BRONCHIAL WASHINGS;  Surgeon: Leslye Peer, MD;  Location: [facility];  Service: Cardiopulmonary;;   BRONCHIAL WASHINGS  08/28/2021   Procedure: BRONCHIAL WASHINGS;  Surgeon: Josephine Igo, DO;  Location: [facility];  Service: Cardiopulmonary;;   NASAL SINUS SURGERY     NECK SURGERY  1996   VIDEO BRONCHOSCOPY N/A 06/18/2021   Procedure: VIDEO BRONCHOSCOPY WITH FLUORO;  Surgeon: Leslye Peer, MD;  Location: [facility];  Service: Cardiopulmonary;  Laterality: N/A;   VIDEO BRONCHOSCOPY N/A 08/28/2021   Procedure: VIDEO BRONCHOSCOPY WITH FLUORO;  Surgeon: Josephine Igo, DO;  Location: [facility];  Service: Cardiopulmonary;  Laterality: N/A;    SOCIAL HISTORY: Social History   Socioeconomic History   Marital status: Married    Spouse name: Not on file   Number of children: 3   Years of education: Not on file   Highest education level: Bachelor's degree (e.g., BA, AB, BS)  Occupational History   Not on file  Tobacco Use   Smoking status: Former    Current packs/day: 0.00    Average packs/day: 0.1 packs/day for 3.0 years (0.3 ttl pk-yrs)    Types: Cigarettes    Start date: 10/22/1979    Quit date: 10/21/1982    Years since quitting: 41.0   Smokeless tobacco: Never  Vaping Use   Vaping status: Never Used  Substance and Sexual Activity   Alcohol use: Not Currently    Comment: 1 drink daily   Drug use: No   Sexual activity: Not on file  Other Topics Concern   Not on file  Social History Narrative   Not on file   Social Drivers of Health   Financial Resource Strain: Low Risk  (03/15/2022)   Received from [facility], [facility]   Overall Financial Resource Strain (CARDIA)    Difficulty of Paying Living Expenses: Not hard at all  Food Insecurity: Low Risk  (04/04/2023)   [facility]   Hunger Vital Sign    Worried About Running Out of Food in the Last Year: Never true     Ran Out of Food in the Last Year: Never true  Transportation Needs: No Transportation Needs (03/15/2022)   Received [facility], [facility]   [facility] - Transportation    Lack of Transportation (Medical): No    Lack of Transportation (Non-Medical): No  Physical Activity: Insufficiently Active (01/10/2022)   Received from [facility], [facility]   Exercise Vital Sign    Days of Exercise per Week: 3 days    Minutes of Exercise per Session: 30 min  Stress: No Stress Concern Present (01/10/2022)   Received from [facility], [facility] of Occupational Health - Occupational Stress Questionnaire    Feeling of Stress : Only a little  Social Connections: Socially Integrated (01/10/2022)   Received from [facility], [facility]   Social  Connection and Isolation Panel [NHANES]    Frequency of Communication with Friends and Family: More than three times a week    Frequency of Social Gatherings with Friends and Family: Twice a week    Attends Religious Services: 1 to 4 times per year    Active Member of [employer] or Organizations: Yes    Attends Engineer, structural: More than 4 times per year    Marital Status: Married  Catering manager Violence: Not At Risk (03/15/2022)   Received [facility], [facility]   Humiliation, Afraid, Rape, and Kick questionnaire    Fear of Current or Ex-Partner: No    Emotionally Abused: No    Physically Abused: No    Sexually Abused: No    FAMILY HISTORY: Family History  Problem Relation Age of Onset   Hypertension Mother    Allergies Mother    Allergies Father    CAD Father 78   Breast cancer Paternal Grandmother    Hypertension Other    Heart disease Other     ALLERGIES:  is allergic to clarithromycin, ofev [nintedanib], and penicillins.  MEDICATIONS:  Current Outpatient Medications  Medication Sig Dispense Refill   acyclovir (ZOVIRAX) 400 MG tablet Take 1 tablet (400 mg total) by mouth 2 (two) times daily. 60  tablet 3   albuterol (PROVENTIL) (2.5 MG/3ML) 0.083% nebulizer solution USE ONE VIAL IN NEBULIZER EVERY 6 HOURS AS NEEDED WHEEZING OR SHORTNESS OF BREATH 120 mL 12   albuterol (VENTOLIN HFA) 108 (90 Base) MCG/ACT inhaler Inhale 2 puffs into the lungs every 6 hours as needed for wheezing or shortness of breath. 8.5 g 1   ALPRAZolam (XANAX) 0.5 MG tablet Take 0.5 mg by mouth at bedtime.     ARMOUR THYROID 90 MG tablet Take 90 mg by mouth daily.     aspirin EC 81 MG tablet Take 1 tablet (81 mg total) by mouth daily. Swallow whole. 90 tablet 3   ASSESS FULL RANGE PEAK METER DEVI as directed.     azelastine (ASTELIN) 0.1 % nasal spray Place 2 sprays into both nostrils 2 (two) times daily. Use in each nostril as directed 30 mL 12   azithromycin (ZITHROMAX) 250 MG tablet Take 2 tablets on day one, then one tablet, days 2-5. 6 tablet 0   bisoprolol (ZEBETA) 5 MG tablet Take 1 tablet (5 mg total) by mouth daily. 30 tablet 2   Budeson-Glycopyrrol-Formoterol (BREZTRI AEROSPHERE) 160-9-4.8 MCG/ACT AERO Inhale 2 puffs into the lungs in the morning and at bedtime. 10.7 g 11   cetirizine (ZYRTEC) 10 MG tablet Take 1 tablet (10 mg total) by mouth daily. 30 tablet 11   cholecalciferol (VITAMIN D) 1000 UNITS tablet Take 3,000 Units by mouth daily.     Dextromethorphan HBr (DELSYM PO) Take by mouth as needed. For cough     dextromethorphan-guaiFENesin (MUCINEX DM) 30-600 MG 12hr tablet Take 1 tablet by mouth 2 (two) times daily.     EPINEPHrine (EPI-PEN) 0.3 mg/0.3 mL DEVI Inject 0.3 mg into the muscle as needed.     ezetimibe-simvastatin (VYTORIN) 10-40 MG tablet Take 1 tablet by mouth daily. 90 tablet 3   fluticasone (FLONASE) 50 MCG/ACT nasal spray 1 spray.     Guaifenesin 1200 MG TB12      hydrOXYzine (ATARAX) 25 MG tablet Take 25 mg by mouth every 8 (eight) hours as needed.     ibuprofen (ADVIL) 200 MG tablet Take 200 mg by mouth every 6 (six) hours as needed.  ketoconazole (NIZORAL) 2 % cream SMARTSIG:1  Application Topical 1 to 2 Times Daily     levofloxacin (LEVAQUIN) 500 MG tablet Take 1 tablet (500 mg total) by mouth daily. 7 tablet 0   levothyroxine (SYNTHROID) 50 MCG tablet Take 50 mcg by mouth daily before breakfast.     montelukast (SINGULAIR) 10 MG tablet Take 10 mg by mouth at bedtime.     Multiple Vitamin (MULTIVITAMIN) tablet Take 1 tablet by mouth daily.     omeprazole (PRILOSEC) 40 MG capsule Take 1 capsule (40 mg total) by mouth 2 (two) times daily. Take 30 minutes before a meal 60 capsule 3   OXYGEN Inhale 2 L into the lungs continuous. As needed     predniSONE (DELTASONE) 10 MG tablet 2 tabs daily for 1 week then 1 tab daily for 2 weeks and 1/2 tab daily until back in office. 60 tablet 0   sildenafil (REVATIO) 20 MG tablet Take 20 mg by mouth daily as needed (ED).     Simethicone (SIMETHICONE ULTRA STRENGTH) 180 MG CAPS Take 1 capsule (180 mg total) by mouth 3 (three) times daily as needed. 90 capsule 0   sodium chloride (OCEAN) 0.65 % SOLN nasal spray Place 1 spray into both nostrils as needed for congestion.     tamsulosin (FLOMAX) 0.4 MG CAPS capsule Take 0.4 mg by mouth daily.     triamcinolone cream (KENALOG) 0.1 % Apply 1 application topically daily as needed (sun burn itch).     Turmeric 400 MG CAPS 1 capsule Orally once a day     UNABLE TO FIND Med Name: Allergy injections once weekly     No current facility-administered medications for this visit.    REVIEW OF SYSTEMS:   Constitutional: ( - ) fevers, ( - )  chills , ( - ) night sweats Eyes: ( - ) blurriness of vision, ( - ) double vision, ( - ) watery eyes Ears, nose, mouth, throat, and face: ( - ) mucositis, ( - ) sore throat Respiratory: ( - ) cough, ( + ) dyspnea, ( - ) wheezes Cardiovascular: ( - ) palpitation, ( - ) chest discomfort, ( - ) lower extremity swelling Gastrointestinal:  ( - ) nausea, ( - ) heartburn, ( - ) change in bowel habits Skin: ( - ) abnormal skin rashes Lymphatics: ( - ) new  lymphadenopathy, ( - ) easy bruising Neurological: ( - ) numbness, ( - ) tingling, ( - ) new weaknesses Behavioral/Psych: ( - ) mood change, ( - ) new changes  All other systems were reviewed with the patient and are negative.  PHYSICAL EXAMINATION: ECOG PERFORMANCE STATUS: 1 - Symptomatic but completely ambulatory  GENERAL: well appearing middle-aged Caucasian male in NAD  SKIN: skin color, texture, turgor are normal, no rashes or significant lesions EYES: conjunctiva are pink and non-injected, sclera clear LUNGS: clear to auscultation and percussion with normal breathing effort HEART: regular rate & rhythm and no murmurs and no lower extremity edema Musculoskeletal: no cyanosis of digits and no clubbing  PSYCH: alert & oriented x 3, fluent speech NEURO: no focal motor/sensory deficits   LABORATORY DATA:  I have reviewed the data as listed    Latest Ref Rng & Units 10/08/2023    8:17 AM 09/12/2023    5:43 PM 09/10/2023    8:08 AM  CBC  WBC 4.0 - 10.5 K/uL 5.5  5.6  5.4   Hemoglobin 13.0 - 17.0 g/dL 16.1  09.6  15.5  Hematocrit 39.0 - 52.0 % 45.1  44.7  45.5   Platelets 150 - 400 K/uL 202  282  235        Latest Ref Rng & Units 10/08/2023    8:17 AM 09/12/2023    5:43 PM 09/10/2023    8:08 AM  CMP  Glucose 70 - 99 mg/dL 85  161  94   BUN 8 - 23 mg/dL 18  18  13    Creatinine 0.61 - 1.24 mg/dL 0.96  0.45  4.09   Sodium 135 - 145 mmol/L 133  129  129   Potassium 3.5 - 5.1 mmol/L 4.4  4.6  4.3   Chloride 98 - 111 mmol/L 98  97  93   CO2 22 - 32 mmol/L 30  25  31    Calcium 8.9 - 10.3 mg/dL 9.4  9.4  9.8   Total Protein 6.5 - 8.1 g/dL 7.8   8.6   Total Bilirubin <1.2 mg/dL 0.5   0.5   Alkaline Phos 38 - 126 U/L 64   78   AST 15 - 41 U/L 33   29   ALT 0 - 44 U/L 31   21     Lab Results  Component Value Date   MPROTEIN 0.6 (H) 10/08/2023   MPROTEIN 0.7 (H) 09/10/2023   MPROTEIN 0.7 (H) 08/13/2023   Lab Results  Component Value Date   KPAFRELGTCHN 39.3 (H)  10/08/2023   KPAFRELGTCHN 43.2 (H) 09/10/2023   KPAFRELGTCHN 38.2 (H) 08/13/2023   LAMBDASER 17.6 10/08/2023   LAMBDASER 17.0 09/10/2023   LAMBDASER 16.1 08/13/2023   KAPLAMBRATIO 2.23 (H) 10/08/2023   KAPLAMBRATIO 2.54 (H) 09/10/2023   KAPLAMBRATIO 2.37 (H) 08/13/2023    RADIOGRAPHIC STUDIES: I have personally reviewed the radiological images as listed and agreed with the findings in the report: lytic lesions in the hip bones bilaterally.  DG ESOPHAGUS W SINGLE CM (SOL OR THIN BA) Result Date: 11/05/2023 CLINICAL DATA:  Patient with a history of multiple myeloma with interstitial lung disease presents today with coughing during meals, raspy voice and globus sensation EXAM: ESOPHAGUS/BARIUM SWALLOW/TABLET STUDY TECHNIQUE: Combined double and single contrast examination was performed using effervescent crystals, high-density barium, and thin liquid barium. This exam was performed by APP name, and was supervised and interpreted by Rad name. FLUOROSCOPY: Radiation Exposure Index (as provided by the fluoroscopic device): 29.50 mGy Kerma COMPARISON:  None Available. FINDINGS: Swallowing: Appears normal. No vestibular penetration or aspiration seen. Pharynx: Unremarkable. Esophagus: Normal appearance. Esophageal motility: Mild dysmotility with intermittent tertiary contractions. Hiatal Hernia: None. Gastroesophageal reflux: None visualized. Ingested 13mm barium tablet: Passed normally Other: None. IMPRESSION: Mild dysmotility with intermittent tertiary contractions. Otherwise normal study. Electronically Signed   By: Marliss Coots M.D.   On: 11/05/2023 15:33   DG SWALLOW FUNC OP MEDICARE SPEECH PATH Result Date: 11/05/2023 CLINICAL DATA:  Patient with a history of multiple myeloma and interstitial lung disease presents today with complaints of coughing during meals, raspy voice and globus sensation. EXAM: MODIFIED BARIUM SWALLOW TECHNIQUE: Different consistencies of barium were administered orally to the  patient by the Speech Pathologist. Imaging of the pharynx was performed in the lateral projection. Alwyn Ren NP was present in the fluoroscopy room during this study, which was supervised and interpreted by Dr. Elby Showers. Radiologist, not in attendance for the exam. Different consistencies of barium were administered orally to the patient by the Speech Pathologist. Imaging of the pharynx was performed in the lateral projection. The radiologist was present  in the fluoroscopy room for this study, providing personal supervision. FLUOROSCOPY: Radiation Exposure Index (as provided by the fluoroscopic device): 8.10 mGy Kerma COMPARISON:  None Available. FINDINGS: Vestibular  Penetration:  None seen. Aspiration:  None seen. Other:  None. IMPRESSION: Normal swallow evaluation. Please refer to the Speech Pathologists report for complete details and recommendations. Electronically Signed   By: Marliss Coots M.D.   On: 11/05/2023 15:32   DG Chest 2 View Result Date: 10/28/2023 CLINICAL DATA:  Cough, dyspnea. EXAM: CHEST - 2 VIEW COMPARISON:  September 12, 2023.  August 28, 2021. FINDINGS: Stable cardiomediastinal silhouette. Stable coarse interstitial densities are noted throughout both lungs consistent with history of chronic interstitial lung disease. No definite acute abnormality is noted. Bony thorax is unremarkable. IMPRESSION: Stable chronic lung opacities as noted above. Electronically Signed   By: Lupita Raider M.D.   On: 10/28/2023 12:34     ASSESSMENT & PLAN Samuel [name] is a 68 y.o. male with medical history significant for IgG Kappa multiple myeloma who presents for a follow up visit.   After review of the labs, review of the records, and discussion with the patient the findings are most consistent with an IgG kappa multiple myeloma.  The patient has 2 lytic lesions within the pelvic bones (more noted in spine on CT scan) as well as 10% plasma cells within the bone marrow biopsy.  This combined  with his serological findings confirm the diagnosis of multiple myeloma.  The initial treatment of choice for this patient's multiple myeloma was VRd. This will consist of bortezomib 1.5mg /m2 on days 1, 8, 15, dexamethasone 40mg  on days 1,8, and 15, and revlimid 25mg  PO daily days 1-14. Cycles will consist of 21 days. We will use this regimen to stabilize the patient's myeloma, then make a referral to a BMT center of his choosing for consideration of a bone marrow transplant once he has been noted to have a good response (VGPR or better). Zometa was started after dental clearance was obtained. This will be continued x 2 years.   Unfortunately had a drug reaction to [name] and was admitted to the hospital from 06/15/2021 until 06/18/2021.  The patient underwent a B AL which clearly showed evidence of eosinophils.  This is consistent with a drug reaction most likely caused by the patient's [name].  Discussed the case with pulmonology who recommended that we not rechallenge for attempt other drugs in the same class such as pomalidomide.  Given the patient's excellent response to [facility] therapy might preference would be to continue that.  As such I would recommend we proceed with CyBorD chemotherapy.  Daratumumab-based therapy could have been considered, but is often paired with an immunologic.  Additionally I would like to preserve those for additional lines of therapy if necessary.  Therefore we will proceed with CyBorD chemotherapy have the patient referred to transplant.  R-IPSS score: Stage 2. PFS of 42 months  # IgG Kappa Multiple Myeloma (t11;14, standard risk)  --diagnostic criteria was met with monoclonal plasma cells in the bone marrow and lytic lesions on the bone --recommend proceeding with VRd chemotherapy as noted above --patient is young and healthy enough for consideration of BMT, though he is borderline with his age. Will consider referral pending response to treatment.  -- Cycle 1  Day 1 started on 03/16/2021 --decreased dexamethasone to 20mg  PO weekly due to fluid overload. Also started lasix 20mg  PO (changed on 06/01/2021) --plan to HOLD revlimid moving forward after evidence of  drug reaction in the lungs. --started CyBorD chemotherapy on 06/29/2021 but held starting 07/05/2021 given worsening lung function --patient has reached a VGPR.  Plan: --Labs from today were reviewed with patient. CBC is unremarkable without any cytopenias. Creatinine and calcium levels are normal. Myeloma panel pending today --M protein is stable, with last myeloma panel from 10/08/2023 measuring M protein at 0.6 g/dL. --Discussed the need to start treatment, however given his worsening lung function this would be inadvisable at this time. --[facility] recommended Dara/[name]/dexamethasone which we agree is a reasonable regimen. Patient was aware of the risks and benefits regarding possible worsening of the lung function. --Discussed the risk of waiting to resume treatment including end organ damage including cytopenias, renal dysfunction and pathologic fractures.  --Labs today show white blood cell 9.9, hemoglobin 13.6, MCV 88.8, platelets 249 --RTC in 12 weeks with interval 4 week with labs   # Drug Reaction in the Lung --confirmed with increased eosinophils on BAL during admission for shortness of breath --on pirfenidone and steroid taper managed by pulmonologist, Dr. Marchelle Gearing.  # Supportive Care -- chemotherapy education complete --zometa therapy started on 04/05/2021 (4 mg IV q 3 months), treatment on hold per patient's request. --ASA 81mg  PO daily for thromboprophylaxis on revlimid -- zofran 8mg  q8H PRN and compazine 10mg  PO q6H for nausea -- acyclovir 400mg  PO BID for VCZ prophylaxis -- no pain medication required at this time.   No orders of the defined types were placed in this encounter.  All questions were answered. The patient knows to call the clinic with any problems,  questions or concerns.  I have spent a total of 30 minutes minutes of face-to-face and non-face-to-face time, preparing to see the patient,performing a medically appropriate examination, counseling and educating the patient,  documenting clinical information in the electronic health record,  and care coordination.   Ulysees Barns, MD Department of Hematology/Oncology [facility] Phone: [phone] Pager: [phone] Email: [email or web address]    11/06/2023 7:26 AM

## 2023-11-07 LAB — KAPPA/LAMBDA LIGHT CHAINS
Kappa free light chain: 34.1 mg/L — ABNORMAL HIGH (ref 3.3–19.4)
Kappa, lambda light chain ratio: 2.54 — ABNORMAL HIGH (ref 0.26–1.65)
Lambda free light chains: 13.4 mg/L (ref 5.7–26.3)

## 2023-11-10 ENCOUNTER — Telehealth: Payer: Self-pay | Admitting: Adult Health

## 2023-11-10 NOTE — Telephone Encounter (Signed)
Patient would like portable oxygen tanks. States is private pay. Patient uses [employer]. Patient phone number is [phone].

## 2023-11-10 NOTE — Addendum Note (Signed)
Encounter addended by: Essie Hart, RN on: 11/10/2023 10:01 AM  Actions taken: Flowsheet data copied forward, Flowsheet accepted

## 2023-11-11 ENCOUNTER — Encounter (HOSPITAL_COMMUNITY)
Admission: RE | Admit: 2023-11-11 | Discharge: 2023-11-11 | Disposition: A | Discharge status: Home / Self Care | Payer: Medicare Other | Source: Ambulatory Visit | Attending: Internal Medicine | Admitting: Internal Medicine

## 2023-11-11 DIAGNOSIS — J849 Interstitial pulmonary disease, unspecified: Secondary | ICD-10-CM

## 2023-11-11 NOTE — Telephone Encounter (Signed)
PT states he is returning Heathers call. Please try again @ [phone]

## 2023-11-12 ENCOUNTER — Telehealth: Payer: Self-pay | Admitting: *Deleted

## 2023-11-12 DIAGNOSIS — J9611 Chronic respiratory failure with hypoxia: Secondary | ICD-10-CM

## 2023-11-12 NOTE — Progress Notes (Signed)
Pulmonary Individual Treatment Plan  Patient Details  Name: Samuel [name] MRN: [id number] Date of Birth: 12-31-1955 Referring Provider:   Doristine Devoid Pulmonary Rehab Walk Test from 10/06/2023 in [facility]  Referring Provider Ramaswamy       Initial Encounter Date:  Flowsheet Row Pulmonary Rehab Walk Test from 10/06/2023 in [facility]  Date 10/06/23       Visit Diagnosis: ILD (interstitial lung disease) (HCC)  Patient's Home Medications on Admission:   Current Outpatient Medications:    acyclovir (ZOVIRAX) 400 MG tablet, Take 1 tablet (400 mg total) by mouth 2 (two) times daily., Disp: 60 tablet, Rfl: 3   albuterol (PROVENTIL) (2.5 MG/3ML) 0.083% nebulizer solution, USE ONE VIAL IN NEBULIZER EVERY 6 HOURS AS NEEDED WHEEZING OR SHORTNESS OF BREATH, Disp: 120 mL, Rfl: 12   albuterol (VENTOLIN HFA) 108 (90 Base) MCG/ACT inhaler, Inhale 2 puffs into the lungs every 6 hours as needed for wheezing or shortness of breath., Disp: 8.5 g, Rfl: 1   ALPRAZolam (XANAX) 0.5 MG tablet, Take 0.5 mg by mouth at bedtime., Disp: , Rfl:    ARMOUR THYROID 90 MG tablet, Take 90 mg by mouth daily., Disp: , Rfl:    aspirin EC 81 MG tablet, Take 1 tablet (81 mg total) by mouth daily. Swallow whole., Disp: 90 tablet, Rfl: 3   ASSESS FULL RANGE PEAK METER DEVI, as directed., Disp: , Rfl:    azelastine (ASTELIN) 0.1 % nasal spray, Place 2 sprays into both nostrils 2 (two) times daily. Use in each nostril as directed, Disp: 30 mL, Rfl: 12   bisoprolol (ZEBETA) 5 MG tablet, Take 1 tablet (5 mg total) by mouth daily., Disp: 30 tablet, Rfl: 2   Budeson-Glycopyrrol-Formoterol (BREZTRI AEROSPHERE) 160-9-4.8 MCG/ACT AERO, Inhale 2 puffs into the lungs in the morning and at bedtime., Disp: 10.7 g, Rfl: 11   cetirizine (ZYRTEC) 10 MG tablet, Take 1 tablet (10 mg total) by mouth daily., Disp: 30  tablet, Rfl: 11   cholecalciferol (VITAMIN D) 1000 UNITS tablet, Take 3,000 Units by mouth daily., Disp: , Rfl:    Dextromethorphan HBr (DELSYM PO), Take by mouth as needed. For cough, Disp: , Rfl:    dextromethorphan-guaiFENesin (MUCINEX DM) 30-600 MG 12hr tablet, Take 1 tablet by mouth 2 (two) times daily., Disp: , Rfl:    EPINEPHrine (EPI-PEN) 0.3 mg/0.3 mL DEVI, Inject 0.3 mg into the muscle as needed., Disp: , Rfl:    ezetimibe-simvastatin (VYTORIN) 10-40 MG tablet, Take 1 tablet by mouth daily., Disp: 90 tablet, Rfl: 3   fluticasone (FLONASE) 50 MCG/ACT nasal spray, 1 spray., Disp: , Rfl:    Guaifenesin 1200 MG TB12, , Disp: , Rfl:    hydrOXYzine (ATARAX) 25 MG tablet, Take 25 mg by mouth every 8 (eight) hours as needed., Disp: , Rfl:    ibuprofen (ADVIL) 200 MG tablet, Take 200 mg by mouth every 6 (six) hours as needed., Disp: , Rfl:    ketoconazole (NIZORAL) 2 % cream, SMARTSIG:1 Application Topical 1 to 2 Times Daily, Disp: , Rfl:    levothyroxine (SYNTHROID) 50 MCG tablet, Take 50 mcg by mouth daily before breakfast., Disp: , Rfl:    montelukast (SINGULAIR) 10 MG tablet, Take 10 mg by mouth at bedtime., Disp: , Rfl:    Multiple Vitamin (MULTIVITAMIN) tablet, Take 1 tablet by mouth daily., Disp: , Rfl:    omeprazole (PRILOSEC) 40 MG  capsule, Take 1 capsule (40 mg total) by mouth 2 (two) times daily. Take 30 minutes before a meal, Disp: 60 capsule, Rfl: 3   OXYGEN, Inhale 2 L into the lungs continuous. As needed, Disp: , Rfl:    predniSONE (DELTASONE) 10 MG tablet, 2 tabs daily for 1 week then 1 tab daily for 2 weeks and 1/2 tab daily until back in office., Disp: 60 tablet, Rfl: 0   sildenafil (REVATIO) 20 MG tablet, Take 20 mg by mouth daily as needed (ED)., Disp: , Rfl:    Simethicone (SIMETHICONE ULTRA STRENGTH) 180 MG CAPS, Take 1 capsule (180 mg total) by mouth 3 (three) times daily as needed., Disp: 90 capsule, Rfl: 0   sodium chloride (OCEAN) 0.65 % SOLN nasal spray, Place 1 spray  into both nostrils as needed for congestion., Disp: , Rfl:    tamsulosin (FLOMAX) 0.4 MG CAPS capsule, Take 0.4 mg by mouth daily., Disp: , Rfl:    triamcinolone cream (KENALOG) 0.1 %, Apply 1 application topically daily as needed (sun burn itch)., Disp: , Rfl:    Turmeric 400 MG CAPS, 1 capsule Orally once a day, Disp: , Rfl:    UNABLE TO FIND, Med Name: Allergy injections once weekly, Disp: , Rfl:   Past Medical History: Past Medical History:  Diagnosis Date   Asthma    GERD (gastroesophageal reflux disease)    High cholesterol    under control.    History of blood transfusion    Hypothyroidism    Interstitial lung disease (HCC)    Multiple myeloma (HCC)    Perennial allergic rhinitis    Pneumonia    Sciatic pain, right    Seasonal allergic rhinitis    Thyroid disease     Tobacco Use: Social History   Tobacco Use  Smoking Status Former   Current packs/day: 0.00   Average packs/day: 0.1 packs/day for 3.0 years (0.3 ttl pk-yrs)   Types: Cigarettes   Start date: 10/22/1979   Quit date: 10/21/1982   Years since quitting: 41.0  Smokeless Tobacco Never    Labs: Review Flowsheet       Latest Ref Rng & Units 06/15/2021 07/12/2021 09/27/2021  Labs for ITP Cardiac and Pulmonary Rehab  Hemoglobin A1c 4.6 - 6.5 % - 5.7  6.2   PH, Arterial 7.350 - 7.450 7.445  - -  PCO2 arterial 32.0 - 48.0 mmHg 34.0  - -  Bicarbonate 20.0 - 28.0 mmol/L 23.0  - -  O2 Saturation % 97.6  - -    Capillary Blood Glucose: No results found for: "GLUCAP"   Pulmonary Assessment Scores:  Pulmonary Assessment Scores     Row Name 10/06/23 1017         ADL UCSD   ADL Phase Entry     SOB Score total 63       CAT Score   CAT Score 19       mMRC Score   mMRC Score 4             UCSD: Self-administered rating of dyspnea associated with activities of daily living (ADLs) 6-point scale (0 = "not at all" to 5 = "maximal or unable to do because of breathlessness")  Scoring Scores range from 0  to 120.  Minimally important difference is 5 units  CAT: CAT can identify the health impairment of COPD patients and is better correlated with disease progression.  CAT has a scoring range of zero to 40. The CAT score is  classified into four groups of low (less than 10), medium (10 - 20), high (21-30) and very high (31-40) based on the impact level of disease on health status. A CAT score over 10 suggests significant symptoms.  A worsening CAT score could be explained by an exacerbation, poor medication adherence, poor inhaler technique, or progression of COPD or comorbid conditions.  CAT MCID is 2 points  mMRC: mMRC (Modified Medical Research Council) Dyspnea Scale is used to assess the degree of baseline functional disability in patients of respiratory disease due to dyspnea. No minimal important difference is established. A decrease in score of 1 point or greater is considered a positive change.   Pulmonary Function Assessment:  Pulmonary Function Assessment - 10/06/23 0957       Breath   Bilateral Breath Sounds Rales    Shortness of Breath Yes;Limiting activity;Panic with Shortness of Breath;Fear of Shortness of Breath             Exercise Target Goals: Exercise Program Goal: Individual exercise prescription set using results from initial 6 min walk test and THRR while considering  patient's activity barriers and safety.   Exercise Prescription Goal: Initial exercise prescription builds to 30-45 minutes a day of aerobic activity, 2-3 days per week.  Home exercise guidelines will be given to patient during program as part of exercise prescription that the participant will acknowledge.  Activity Barriers & Risk Stratification:  Activity Barriers & Cardiac Risk Stratification - 10/06/23 0951       Activity Barriers & Cardiac Risk Stratification   Activity Barriers Deconditioning;Muscular Weakness;Shortness of Breath    Cardiac Risk Stratification Moderate             6  Minute Walk:  6 Minute Walk     Row Name 10/06/23 1043         6 Minute Walk   Phase Initial     Distance 730 feet     Distance Feet Change 730 ft     Walk Time 6 minutes     # of Rest Breaks 0     MPH 1.38     METS 1.7     RPE 12     Perceived Dyspnea  1.5     VO2 Peak 5.97     Symptoms No     Resting HR 56 bpm     Resting Oxygen Saturation  95 %     Exercise Oxygen Saturation  during 6 min walk 89 %     Max Ex. HR 74 bpm     Max Ex. BP 128/70     2 Minute Post BP 112/68       Interval HR   1 Minute HR 68     2 Minute HR 72     3 Minute HR 74     4 Minute HR 74     5 Minute HR 74     6 Minute HR 74     2 Minute Post HR 57     Interval Heart Rate? Yes       Interval Oxygen   Interval Oxygen? Yes     Baseline Oxygen Saturation % 95 %     1 Minute Oxygen Saturation % 97 %     1 Minute Liters of Oxygen 2 L     2 Minute Oxygen Saturation % 96 %     2 Minute Liters of Oxygen 2 L     3 Minute Oxygen Saturation %  91 %     3 Minute Liters of Oxygen 2 L     4 Minute Oxygen Saturation % 89 %     4 Minute Liters of Oxygen 2 L     5 Minute Oxygen Saturation % 90 %     5 Minute Liters of Oxygen 2 L     6 Minute Oxygen Saturation % 90 %     6 Minute Liters of Oxygen 2 L     2 Minute Post Oxygen Saturation % 97 %     2 Minute Post Liters of Oxygen 2 L              Oxygen Initial Assessment:  Oxygen Initial Assessment - 11/03/23 0900       Home Oxygen   Home Oxygen Device Home Concentrator;E-Tanks;Portable Concentrator    Sleep Oxygen Prescription Continuous    Liters per minute 2    Home Exercise Oxygen Prescription Continuous    Liters per minute 2    Home Resting Oxygen Prescription Continuous    Liters per minute 2    Compliance with Home Oxygen Use Yes      Program Oxygen Prescription   Program Oxygen Prescription Continuous    Liters per minute 2      Intervention   Short Term Goals To learn and exhibit compliance with exercise, home and travel O2  prescription;To learn and understand importance of monitoring SPO2 with pulse oximeter and demonstrate accurate use of the pulse oximeter.;To learn and understand importance of maintaining oxygen saturations>88%;To learn and demonstrate proper pursed lip breathing techniques or other breathing techniques. ;To learn and demonstrate proper use of respiratory medications    Long  Term Goals Exhibits compliance with exercise, home  and travel O2 prescription;Maintenance of O2 saturations>88%;Compliance with respiratory medication;Verbalizes importance of monitoring SPO2 with pulse oximeter and return demonstration;Exhibits proper breathing techniques, such as pursed lip breathing or other method taught during program session;Demonstrates proper use of MDI's             Oxygen Re-Evaluation:  Oxygen Re-Evaluation     Row Name 10/08/23 1036             Program Oxygen Prescription   Program Oxygen Prescription Continuous       Liters per minute 2         Home Oxygen   Home Oxygen Device Home Concentrator;E-Tanks;Portable Concentrator       Sleep Oxygen Prescription Continuous       Liters per minute 2       Home Exercise Oxygen Prescription Continuous       Liters per minute 2       Home Resting Oxygen Prescription Continuous       Liters per minute 2       Compliance with Home Oxygen Use Yes         Goals/Expected Outcomes   Short Term Goals To learn and exhibit compliance with exercise, home and travel O2 prescription;To learn and understand importance of monitoring SPO2 with pulse oximeter and demonstrate accurate use of the pulse oximeter.;To learn and understand importance of maintaining oxygen saturations>88%;To learn and demonstrate proper pursed lip breathing techniques or other breathing techniques. ;To learn and demonstrate proper use of respiratory medications       Long  Term Goals Exhibits compliance with exercise, home  and travel O2 prescription;Maintenance of O2  saturations>88%;Compliance with respiratory medication;Verbalizes importance of monitoring SPO2 with pulse oximeter and return demonstration;Exhibits proper breathing  techniques, such as pursed lip breathing or other method taught during program session;Demonstrates proper use of MDI's       Goals/Expected Outcomes Compliance and understanding of oxygen saturation monitoring and breathing techniques to decrease shortness of breath.                Oxygen Discharge (Final Oxygen Re-Evaluation):  Oxygen Re-Evaluation - 10/08/23 1036       Program Oxygen Prescription   Program Oxygen Prescription Continuous    Liters per minute 2      Home Oxygen   Home Oxygen Device Home Concentrator;E-Tanks;Portable Concentrator    Sleep Oxygen Prescription Continuous    Liters per minute 2    Home Exercise Oxygen Prescription Continuous    Liters per minute 2    Home Resting Oxygen Prescription Continuous    Liters per minute 2    Compliance with Home Oxygen Use Yes      Goals/Expected Outcomes   Short Term Goals To learn and exhibit compliance with exercise, home and travel O2 prescription;To learn and understand importance of monitoring SPO2 with pulse oximeter and demonstrate accurate use of the pulse oximeter.;To learn and understand importance of maintaining oxygen saturations>88%;To learn and demonstrate proper pursed lip breathing techniques or other breathing techniques. ;To learn and demonstrate proper use of respiratory medications    Long  Term Goals Exhibits compliance with exercise, home  and travel O2 prescription;Maintenance of O2 saturations>88%;Compliance with respiratory medication;Verbalizes importance of monitoring SPO2 with pulse oximeter and return demonstration;Exhibits proper breathing techniques, such as pursed lip breathing or other method taught during program session;Demonstrates proper use of MDI's    Goals/Expected Outcomes Compliance and understanding of oxygen saturation  monitoring and breathing techniques to decrease shortness of breath.             Initial Exercise Prescription:  Initial Exercise Prescription - 10/06/23 1000       Date of Initial Exercise RX and Referring Provider   Date 10/06/23    Referring Provider Ramaswamy    Expected Discharge Date 01/01/23      Oxygen   Oxygen Continuous    Liters 2    Maintain Oxygen Saturation 88% or higher      Treadmill   MPH 2    Grade 1    Minutes 15      Recumbant Elliptical   Level 1    RPM 40    Watts 50    Minutes 15      Prescription Details   Frequency (times per week) 2    Duration Progress to 30 minutes of continuous aerobic without signs/symptoms of physical distress      Intensity   THRR 40-80% of Max Heartrate 61-122    Ratings of Perceived Exertion 11-13    Perceived Dyspnea 0-4      Progression   Progression Continue to progress workloads to maintain intensity without signs/symptoms of physical distress.      Resistance Training   Training Prescription Yes    Weight blue bands    Reps 10-15             Perform Capillary Blood Glucose checks as needed.  Exercise Prescription Changes:   Exercise Prescription Changes     Row Name 10/14/23 0900 10/21/23 0938 10/28/23 0900         Response to Exercise   Blood Pressure (Admit) 128/80 102/64 --     Blood Pressure (Exercise) 152/70 128/68 --     Blood Pressure (  Exit) 120/66 106/68 --     Heart Rate (Admit) 64 bpm 67 bpm --     Heart Rate (Exercise) 80 bpm 83 bpm --     Heart Rate (Exit) 77 bpm 80 bpm --     Oxygen Saturation (Admit) 100 % 95 % --     Oxygen Saturation (Exercise) 87 % 93 % --     Oxygen Saturation (Exit) 97 % 100 % --     Rating of Perceived Exertion (Exercise) 13 12 --     Perceived Dyspnea (Exercise) 1 2 --     Duration Continue with 30 min of aerobic exercise without signs/symptoms of physical distress. Continue with 30 min of aerobic exercise without signs/symptoms of physical  distress. --     Intensity THRR unchanged THRR unchanged --       Progression   Progression Continue to progress workloads to maintain intensity without signs/symptoms of physical distress. Continue to progress workloads to maintain intensity without signs/symptoms of physical distress. --     Average METs 1.9 -- --       Resistance Training   Training Prescription Yes Yes --     Weight blue bands blue bands --     Reps 10-15 10-15 --     Time 10 Minutes 10 Minutes --       Interval Training   Interval Training Yes -- --     Equipment Treadmill;Recumbant Elliptical -- --       Oxygen   Oxygen Continuous Continuous --     Liters 8 3-8 --       Treadmill   MPH 1.5 1.8 --     Grade 0 0 --     Minutes 15 15 --     METs -- 2.3 --       Recumbant Elliptical   Level 1 3 --     RPM 40 45 --     Watts -- 51 --     Minutes 15 15 --     METs 1.8 2 --       Oxygen   Maintain Oxygen Saturation -- 88% or higher --              Exercise Comments:   Exercise Comments     Row Name 10/14/23 0946           Exercise Comments Pt completed first day of group exercise. He exercised on the recumbent elliptical for 15 min, level 1, METs 1.8. He then walked on the treadmill for 15 min, speed 1.5, 0 incline. Pt required increased O2 to 8L on TM. He was able to perform warm up and cool down standing, including squats. Tolerated exercise fair, admits he is tired. Discussed METs with good reception.                Exercise Goals and Review:   Exercise Goals     Row Name 10/06/23 [phone]             Exercise Goals   Increase Physical Activity Yes       Intervention Provide advice, education, support and counseling about physical activity/exercise needs.;Develop an individualized exercise prescription for aerobic and resistive training based on initial evaluation findings, risk stratification, comorbidities and participant's personal goals.       Expected Outcomes Short Term:  Attend rehab on a regular basis to increase amount of physical activity.;Long Term: Add in home exercise to make exercise part of routine and  to increase amount of physical activity.;Long Term: Exercising regularly at least 3-5 days a week.       Increase Strength and Stamina Yes       Intervention Provide advice, education, support and counseling about physical activity/exercise needs.;Develop an individualized exercise prescription for aerobic and resistive training based on initial evaluation findings, risk stratification, comorbidities and participant's personal goals.       Expected Outcomes Short Term: Increase workloads from initial exercise prescription for resistance, speed, and METs.;Short Term: Perform resistance training exercises routinely during rehab and add in resistance training at home;Long Term: Improve cardiorespiratory fitness, muscular endurance and strength as measured by increased METs and functional capacity ( )       Able to understand and use rate of perceived exertion (RPE) scale Yes       Intervention Provide education and explanation on how to use RPE scale       Expected Outcomes Short Term: Able to use RPE daily in rehab to express subjective intensity level;Long Term:  Able to use RPE to guide intensity level when exercising independently       Able to understand and use Dyspnea scale Yes       Intervention Provide education and explanation on how to use Dyspnea scale       Expected Outcomes Short Term: Able to use Dyspnea scale daily in rehab to express subjective sense of shortness of breath during exertion;Long Term: Able to use Dyspnea scale to guide intensity level when exercising independently       Knowledge and understanding of Target Heart Rate Range (THRR) Yes       Intervention Provide education and explanation of THRR including how the numbers were predicted and where they are located for reference       Expected Outcomes Short Term: Able to state/look up  THRR;Long Term: Able to use THRR to govern intensity when exercising independently;Short Term: Able to use daily as guideline for intensity in rehab       Understanding of Exercise Prescription Yes       Intervention Provide education, explanation, and written materials on patient's individual exercise prescription       Expected Outcomes Short Term: Able to explain program exercise prescription;Long Term: Able to explain home exercise prescription to exercise independently                Exercise Goals Re-Evaluation :  Exercise Goals Re-Evaluation     Row Name 10/08/23 1035 11/03/23 0857           Exercise Goal Re-Evaluation   Exercise Goals Review Increase Physical Activity;Able to understand and use Dyspnea scale;Understanding of Exercise Prescription;Increase Strength and Stamina;Knowledge and understanding of Target Heart Rate Range (THRR);Able to understand and use rate of perceived exertion (RPE) scale Increase Physical Activity;Able to understand and use Dyspnea scale;Understanding of Exercise Prescription;Increase Strength and Stamina;Knowledge and understanding of Target Heart Rate Range (THRR);Able to understand and use rate of perceived exertion (RPE) scale      Comments Samuel [name] is scheduled to begin exercise on 12/24. Will monitor and progress as able. Samuel [name] has completed 3 exercise sessions. He exercises for 15 min on the recumbent elliptical and treadmill. Samuel [name] averages 2 METs at level 3 on the recumbent elliptical and 2.3 METs at 1.8 mph on the treadmill. He performs the warmup and cooldown standing without limitations. It is too soon to notate any discernable progressions. Samuel [name] did miss class the past week due to illness. Will continue to monitor  and progress as able.      Expected Outcomes Through exercise at rehab and home, the patient will decrease shortness of breath with daily activities and feel confident in carrying out an exercise regimen at home. Through exercise at rehab and  home, the patient will decrease shortness of breath with daily activities and feel confident in carrying out an exercise regimen at home.               Discharge Exercise Prescription (Final Exercise Prescription Changes):  Exercise Prescription Changes - 10/28/23 0900       Response to Exercise   Blood Pressure (Admit) --    Blood Pressure (Exercise) --    Blood Pressure (Exit) --    Heart Rate (Admit) --    Heart Rate (Exercise) --    Heart Rate (Exit) --    Oxygen Saturation (Admit) --    Oxygen Saturation (Exercise) --    Oxygen Saturation (Exit) --    Rating of Perceived Exertion (Exercise) --    Perceived Dyspnea (Exercise) --    Duration --    Intensity --      Progression   Progression --      Resistance Training   Training Prescription --    Weight --    Reps --    Time --      Oxygen   Oxygen --    Liters --      Treadmill   MPH --    Grade --    Minutes --      Recumbant Elliptical   Level --    RPM --    Watts --    Minutes --    METs --      Oxygen   Maintain Oxygen Saturation --             Nutrition:  Target Goals: Understanding of nutrition guidelines, daily intake of sodium 1500mg , cholesterol 200mg , calories 30% from fat and 7% or less from saturated fats, daily to have 5 or more servings of fruits and vegetables.  Biometrics:    Nutrition Therapy Plan and Nutrition Goals:   Nutrition Assessments:  MEDIFICTS Score Key: >=70 Need to make dietary changes  40-70 Heart Healthy Diet <= 40 Therapeutic Level Cholesterol Diet   Picture Your Plate Scores: <87 Unhealthy dietary pattern with much room for improvement. 41-50 Dietary pattern unlikely to meet recommendations for good health and room for improvement. 51-60 More healthful dietary pattern, with some room for improvement.  >60 Healthy dietary pattern, although there may be some specific behaviors that could be improved.    Nutrition Goals  Re-Evaluation:   Nutrition Goals Discharge (Final Nutrition Goals Re-Evaluation):   Psychosocial: Target Goals: Acknowledge presence or absence of significant depression and/or stress, maximize coping skills, provide positive support system. Participant is able to verbalize types and ability to use techniques and skills needed for reducing stress and depression.  Initial Review & Psychosocial Screening:  Initial Psych Review & Screening - 10/06/23 0949       Initial Review   Current issues with Current Anxiety/Panic;Current Psychotropic Meds;Current Stress Concerns    Source of Stress Concerns Chronic Illness;Unable to participate in former interests or hobbies    Comments Pt is stressed about his health. He also admits to having days of sadness because he is not able to do activities such as hiking anymore. He has good support from his family and friends.      Family Dynamics  Good Support System? Yes      Barriers   Psychosocial barriers to participate in program The patient should benefit from training in stress management and relaxation.;Psychosocial barriers identified (see note)      Screening Interventions   Interventions Encouraged to exercise    Expected Outcomes Long Term Goal: Stressors or current issues are controlled or eliminated.;Short Term goal: Utilizing psychosocial counselor, staff and physician to assist with identification of specific Stressors or current issues interfering with healing process. Setting desired goal for each stressor or current issue identified.             Quality of Life Scores:  Scores of 19 and below usually indicate a poorer quality of life in these areas.  A difference of  2-3 points is a clinically meaningful difference.  A difference of 2-3 points in the total score of the Quality of Life Index has been associated with significant improvement in overall quality of life, self-image, physical symptoms, and general health in studies  assessing change in quality of life.  PHQ-9: Review Flowsheet       10/06/2023  Depression screen PHQ 2/9  Decreased Interest 1  Down, Depressed, Hopeless 1  PHQ - 2 Score 2  Altered sleeping 0  Tired, decreased energy 0  Change in appetite 0  Feeling bad or failure about yourself  1  Trouble concentrating 0  Moving slowly or fidgety/restless 1  Suicidal thoughts 0  PHQ-9 Score 4  Difficult doing work/chores Somewhat difficult   Interpretation of Total Score  Total Score Depression Severity:  1-4 = Minimal depression, 5-9 = Mild depression, 10-14 = Moderate depression, 15-19 = Moderately severe depression, 20-27 = Severe depression   Psychosocial Evaluation and Intervention:  Psychosocial Evaluation - 10/06/23 0950       Psychosocial Evaluation & Interventions   Interventions Encouraged to exercise with the program and follow exercise prescription;Stress management education    Comments Samuel [name] admits to having stress due to his current health issues. He also admits to being "a little down". some days due to not being able to do certain activities anymore.  He is currently taking psychostropic meds. He denies needing a referral for a therapist at this time.    Expected Outcomes For Samuel [name] to have less stress throughout the program    Continue Psychosocial Services  No Follow up required             Psychosocial Re-Evaluation:  Psychosocial Re-Evaluation     Row Name 10/08/23 1212 11/10/23 0954           Psychosocial Re-Evaluation   Current issues with Current Depression;Current Psychotropic Meds;Current Stress Concerns Current Depression;Current Psychotropic Meds;Current Stress Concerns      Comments No changes since orienation. Samuel [name] is scheduled to start the program on 10/14/23. Samuel [name] has been placed on medical hold due to his worsening disease. His last class was on 10/21/23. He is currently being seen by speech therapy, oncology, and pulmonary.      Expected Outcomes For  Samuel [name] to attend PR free of any psy/soc barriers or concerns. For Samuel [name] to use positive coping mechanisms to reduce his stress. For Samuel [name] to attend PR free of any psy/soc barriers or concerns. For Samuel [name] to use positive coping mechanisms to reduce his stress.      Interventions Encouraged to attend Pulmonary Rehabilitation for the exercise Encouraged to attend Pulmonary Rehabilitation for the exercise      Continue Psychosocial Services  No Follow up required Follow  up required by staff               Psychosocial Discharge (Final Psychosocial Re-Evaluation):  Psychosocial Re-Evaluation - 11/10/23 0954       Psychosocial Re-Evaluation   Current issues with Current Depression;Current Psychotropic Meds;Current Stress Concerns    Comments Samuel [name] has been placed on medical hold due to his worsening disease. His last class was on 10/21/23. He is currently being seen by speech therapy, oncology, and pulmonary.    Expected Outcomes For Samuel [name] to attend PR free of any psy/soc barriers or concerns. For Samuel [name] to use positive coping mechanisms to reduce his stress.    Interventions Encouraged to attend Pulmonary Rehabilitation for the exercise    Continue Psychosocial Services  Follow up required by staff             Education: Education Goals: Education classes will be provided on a weekly basis, covering required topics. Participant will state understanding/return demonstration of topics presented.  Learning Barriers/Preferences:  Learning Barriers/Preferences - 10/06/23 0950       Learning Barriers/Preferences   Learning Barriers Sight    Learning Preferences Individual Instruction;Written Material             Education Topics: Know Your Numbers Group instruction that is supported by a PowerPoint presentation. Instructor discusses importance of knowing and understanding resting, exercise, and post-exercise oxygen saturation, heart rate, and blood pressure. Oxygen saturation, heart rate, blood  pressure, rating of perceived exertion, and dyspnea are reviewed along with a normal range for these values.    Exercise for the Pulmonary Patient Group instruction that is supported by a PowerPoint presentation. Instructor discusses benefits of exercise, core components of exercise, frequency, duration, and intensity of an exercise routine, importance of utilizing pulse oximetry during exercise, safety while exercising, and options of places to exercise outside of rehab.  Flowsheet Row PULMONARY REHAB OTHER RESPIRATORY from 10/16/2023 in [facility]  Date 10/16/23  Educator EP  Instruction Review Code 1- Verbalizes Understanding       MET Level  Group instruction provided by PowerPoint, verbal discussion, and written material to support subject matter. Instructor reviews what METs are and how to increase METs.    Pulmonary Medications Verbally interactive group education provided by instructor with focus on inhaled medications and proper administration.   Anatomy and Physiology of the Respiratory System Group instruction provided by PowerPoint, verbal discussion, and written material to support subject matter. Instructor reviews respiratory cycle and anatomical components of the respiratory system and their functions. Instructor also reviews differences in obstructive and restrictive respiratory diseases with examples of each.    Oxygen Safety Group instruction provided by PowerPoint, verbal discussion, and written material to support subject matter. There is an overview of "What is Oxygen" and "Why do we need it".  Instructor also reviews how to create a safe environment for oxygen use, the importance of using oxygen as prescribed, and the risks of noncompliance. There is a brief discussion on traveling with oxygen and resources the patient may utilize.   Oxygen Use Group instruction provided by PowerPoint, verbal discussion,  and written material to discuss how supplemental oxygen is prescribed and different types of oxygen supply systems. Resources for more information are provided.    Breathing Techniques Group instruction that is supported by demonstration and informational handouts. Instructor discusses the benefits of pursed lip and diaphragmatic breathing and detailed demonstration on how to perform both.  Risk Factor Reduction Group instruction that is supported by a PowerPoint presentation. Instructor discusses the definition of a risk factor, different risk factors for pulmonary disease, and how the heart and lungs work together.   Pulmonary Diseases Group instruction provided by PowerPoint, verbal discussion, and written material to support subject matter. Instructor gives an overview of the different type of pulmonary diseases. There is also a discussion on risk factors and symptoms as well as ways to manage the diseases.   Stress and Energy Conservation Group instruction provided by PowerPoint, verbal discussion, and written material to support subject matter. Instructor gives an overview of stress and the impact it can have on the body. Instructor also reviews ways to reduce stress. There is also a discussion on energy conservation and ways to conserve energy throughout the day.   Warning Signs and Symptoms Group instruction provided by PowerPoint, verbal discussion, and written material to support subject matter. Instructor reviews warning signs and symptoms of stroke, heart attack, cold and flu. Instructor also reviews ways to prevent the spread of infection.   Other Education Group or individual verbal, written, or video instructions that support the educational goals of the pulmonary rehab program.    Knowledge Questionnaire Score:  Knowledge Questionnaire Score - 10/06/23 1019       Knowledge Questionnaire Score   Pre Score 17/18             Core Components/Risk Factors/Patient  Goals at Admission:  Personal Goals and Risk Factors at Admission - 10/06/23 0951       Core Components/Risk Factors/Patient Goals on Admission   Improve shortness of breath with ADL's Yes    Intervention Provide education, individualized exercise plan and daily activity instruction to help decrease symptoms of SOB with activities of daily living.    Expected Outcomes Short Term: Improve cardiorespiratory fitness to achieve a reduction of symptoms when performing ADLs;Long Term: Be able to perform more ADLs without symptoms or delay the onset of symptoms    Increase knowledge of respiratory medications and ability to use respiratory devices properly  Yes    Intervention Provide education and demonstration as needed of appropriate use of medications, inhalers, and oxygen therapy.    Expected Outcomes Short Term: Achieves understanding of medications use. Understands that oxygen is a medication prescribed by physician. Demonstrates appropriate use of inhaler and oxygen therapy.;Long Term: Maintain appropriate use of medications, inhalers, and oxygen therapy.    Stress Yes    Intervention Offer individual and/or small group education and counseling on adjustment to heart disease, stress management and health-related lifestyle change. Teach and support self-help strategies.;Refer participants experiencing significant psychosocial distress to appropriate mental health specialists for further evaluation and treatment. When possible, include family members and significant others in education/counseling sessions.    Expected Outcomes Short Term: Participant demonstrates changes in health-related behavior, relaxation and other stress management skills, ability to obtain effective social support, and compliance with psychotropic medications if prescribed.;Long Term: Emotional wellbeing is indicated by absence of clinically significant psychosocial distress or social isolation.             Core  Components/Risk Factors/Patient Goals Review:   Goals and Risk Factor Review     Row Name 10/08/23 1220 11/10/23 0956           Core Components/Risk Factors/Patient Goals Review   Personal Goals Review Develop more efficient breathing techniques such as purse lipped breathing and diaphragmatic breathing and practicing self-pacing with activity.;Increase knowledge of respiratory medications and ability  to use respiratory devices properly.;Improve shortness of breath with ADL's;Stress Develop more efficient breathing techniques such as purse lipped breathing and diaphragmatic breathing and practicing self-pacing with activity.;Increase knowledge of respiratory medications and ability to use respiratory devices properly.;Improve shortness of breath with ADL's;Stress      Review No changes since orientation. Samuel [name] is scheduled to start the program on 10/14/23 Samuel [name] has been placed on medical hold due to his worsening disease. He attended 3 classes so far. Unable to assess goals yet. His last class was on 10/21/23. He is currently being seen by speech therapy, oncology, and pulmonary. He will continue to benefit from PR for nutrition, education, exercise, and lifestyle modification.      Expected Outcomes For Samuel [name] to develop more efficient breathing techniques such as purse lipped breathing and diaphragmatic breathing; and practicing self-pacing activities, to increase his knowledge of respiratory medications and ability to use respiratory devices properly, improve his shortness of breath with ADLs, and to reduce stress. For Samuel [name] to develop more efficient breathing techniques such as purse lipped breathing and diaphragmatic breathing; and practicing self-pacing activities, to increase his knowledge of respiratory medications and ability to use respiratory devices properly, improve his shortness of breath with ADLs, and to reduce stress.               Core Components/Risk Factors/Patient Goals at Discharge  (Final Review):   Goals and Risk Factor Review - 11/10/23 0956       Core Components/Risk Factors/Patient Goals Review   Personal Goals Review Develop more efficient breathing techniques such as purse lipped breathing and diaphragmatic breathing and practicing self-pacing with activity.;Increase knowledge of respiratory medications and ability to use respiratory devices properly.;Improve shortness of breath with ADL's;Stress    Review Samuel [name] has been placed on medical hold due to his worsening disease. He attended 3 classes so far. Unable to assess goals yet. His last class was on 10/21/23. He is currently being seen by speech therapy, oncology, and pulmonary. He will continue to benefit from PR for nutrition, education, exercise, and lifestyle modification.    Expected Outcomes For Samuel [name] to develop more efficient breathing techniques such as purse lipped breathing and diaphragmatic breathing; and practicing self-pacing activities, to increase his knowledge of respiratory medications and ability to use respiratory devices properly, improve his shortness of breath with ADLs, and to reduce stress.             ITP Comments: Pt is making expected progress toward Pulmonary Rehab goals after completing 3 session(s). Recommend continued exercise, life style modification, education, and utilization of breathing techniques to increase stamina and strength, while also decreasing shortness of breath with exertion.  Dr. Mechele Collin is Medical Director for Pulmonary Rehab at [facility].

## 2023-11-12 NOTE — Telephone Encounter (Signed)
PT calling again about issues getting his self pay 02. I do not see an order. He keeps asking for Herbert Seta because she called him back last and left a message. Please call PT to advise. TY.

## 2023-11-12 NOTE — Telephone Encounter (Signed)
PT calling again about issues getting his self pay 02. I do not see an order. He keeps asking for Herbert Seta because she called him back last and left a message. Please call PT to advise. TY.         Electronically signed by Loni Dolly at 11/12/2023  8:17 AM  New encounter opened as other encounters could not be edited.  Spoke with patient, he states that he wanted to keep the portable oxygen that he has at home as well as his continuous home concentrator.  He knows he needs continuous flow oxygen when he goes out and want to private pay to rent portable oxygen tanks to allow him to use 8L with exertion. He says he spoke with [employer] and all they need is an order for the portable tanks.   I advised him I would get a message to Tammy and once I hear back from her I will call him back.  He verbalized understanding.  Tammy, please advise if ok to place order for patient to private pay for portable oxygen tanks.  He was told by [employer] that all he needs is an order.  Thank you.

## 2023-11-13 ENCOUNTER — Encounter (HOSPITAL_COMMUNITY): Admission: RE | Admit: 2023-11-13 | Payer: Medicare Other | Source: Ambulatory Visit

## 2023-11-13 LAB — MULTIPLE MYELOMA PANEL, SERUM
Albumin SerPl Elph-Mcnc: 3.5 g/dL (ref 2.9–4.4)
Albumin/Glob SerPl: 1 (ref 0.7–1.7)
Alpha 1: 0.2 g/dL (ref 0.0–0.4)
Alpha2 Glob SerPl Elph-Mcnc: 0.9 g/dL (ref 0.4–1.0)
B-Globulin SerPl Elph-Mcnc: 1 g/dL (ref 0.7–1.3)
Gamma Glob SerPl Elph-Mcnc: 1.7 g/dL (ref 0.4–1.8)
Globulin, Total: 3.8 g/dL (ref 2.2–3.9)
IgA: 275 mg/dL (ref 61–437)
IgG (Immunoglobin G), Serum: 1812 mg/dL — ABNORMAL HIGH (ref 603–1613)
IgM (Immunoglobulin M), Srm: 116 mg/dL (ref 20–172)
M Protein SerPl Elph-Mcnc: 0.6 g/dL — ABNORMAL HIGH
Total Protein ELP: 7.3 g/dL (ref 6.0–8.5)

## 2023-11-13 NOTE — Telephone Encounter (Signed)
Called and spoke with patient, he states that he did speak with Tammy regarding the portable system that has continuous flow oxygen, the eclipse 5 that goes to 3L.  He states he is going to research this and let us know.  For now, he just wants [place] to order E tanks for him to have to walk from where he works and parks to his office so he can use 8L to get there.  He states once he gets to his office he can use his other portable system.  He wants to be able to refill the portable tanks at home.  I asked him if he has a self fill home concentrator at home.  He states his wife said they do.  I advised him to call [employer] to verify that he does have that refill system and to find out what size tanks you can fill with it.  He verbalized understanding.  He will let us know what he decides about the Eclipse 5.  Order for E tanks, he is requesting 5 at at time to be self pay to rent.

## 2023-11-13 NOTE — Telephone Encounter (Signed)
That is fine for order for portable tanks   If he is looking for a portable system-self pay that does continuous flow - The Eclipse 5 is one option that delivers up to 3 l/m continous flow . Please see Last office note.   Tried to call patient to discuss no answer. [id number] .

## 2023-11-17 ENCOUNTER — Ambulatory Visit: Payer: Medicare Other

## 2023-11-17 DIAGNOSIS — R1310 Dysphagia, unspecified: Secondary | ICD-10-CM

## 2023-11-17 DIAGNOSIS — R498 Other voice and resonance disorders: Secondary | ICD-10-CM

## 2023-11-17 NOTE — Patient Instructions (Addendum)
Homework:  Do your massages (watch video below)  YouTube: Laryngeal Massage & Myofascial Release for a Tight Throat (6 Exercises) Relax your upper body (chest, shoulder, necks) Focus on your breathing (in and out like a wave)  In through your nose  Out through your mouth (blow out with some noise or "shhh")  Add an occasional "ooo" during this breathing routine  Think reverse megaphone (open vocal tract, rounded lips) Keep it forward but short (2-3 seconds)

## 2023-11-17 NOTE — Therapy (Signed)
OUTPATIENT SPEECH LANGUAGE PATHOLOGY VOICE TREATMENT    Patient Name: Samuel [name] MRN: [id number] DOB:02-Oct-1956, 68 y.o., male Today's Date: 11/17/2023  PCP: Tally Joe, MD REFERRING PROVIDER: Tally Joe, MD  END OF SESSION:  End of Session - 11/17/23 0922     Visit Number 23    Number of Visits 30    Date for SLP Re-Evaluation 12/29/23    Authorization Type medicare/ BCBS    SLP Start Time [phone]    SLP Stop Time  0930    SLP Time Calculation (min) 40 min    Activity Tolerance Patient tolerated treatment well                 Past Medical History:  Diagnosis Date   Asthma    GERD (gastroesophageal reflux disease)    High cholesterol    under control.    History of blood transfusion    Hypothyroidism    Interstitial lung disease (HCC)    Multiple myeloma (HCC)    Perennial allergic rhinitis    Pneumonia    Sciatic pain, right    Seasonal allergic rhinitis    Thyroid disease    Past Surgical History:  Procedure Laterality Date   BRONCHIAL BIOPSY  06/18/2021   Procedure: BRONCHIAL BIOPSIES;  Surgeon: Leslye Peer, MD;  Location: [facility];  Service: Cardiopulmonary;;   BRONCHIAL BIOPSY  08/28/2021   Procedure: BRONCHIAL BIOPSIES;  Surgeon: Josephine Igo, DO;  Location: [facility];  Service: Cardiopulmonary;;   BRONCHIAL WASHINGS  06/18/2021   Procedure: BRONCHIAL WASHINGS;  Surgeon: Leslye Peer, MD;  Location: [facility];  Service: Cardiopulmonary;;   BRONCHIAL WASHINGS  08/28/2021   Procedure: BRONCHIAL WASHINGS;  Surgeon: Josephine Igo, DO;  Location: [facility];  Service: Cardiopulmonary;;   NASAL SINUS SURGERY     NECK SURGERY  1996   VIDEO BRONCHOSCOPY N/A 06/18/2021   Procedure: VIDEO BRONCHOSCOPY WITH FLUORO;  Surgeon: Leslye Peer, MD;  Location: [facility];  Service: Cardiopulmonary;  Laterality: N/A;   VIDEO BRONCHOSCOPY N/A 08/28/2021   Procedure: VIDEO BRONCHOSCOPY WITH FLUORO;  Surgeon: Josephine Igo, DO;   Location: [facility];  Service: Cardiopulmonary;  Laterality: N/A;   Patient Active Problem List   Diagnosis Date Noted   Essential hypertension 07/04/2023   Coronary artery disease 11/28/2022   Mixed hyperlipidemia 11/28/2022   Recurrent sinusitis 11/04/2022   Acute bacterial rhinosinusitis 09/26/2022   Chronic respiratory failure with hypoxia (HCC) 08/01/2022   S/P bronchoscopy    ILD (interstitial lung disease) (HCC)    Preop cardiovascular exam 08/21/2021   Pneumonitis 07/20/2021   Anxiety 07/20/2021   Interstitial pulmonary disease (HCC) 07/20/2021   Abnormal CT of the chest 06/18/2021   Dyspnea 06/15/2021   Hypoxia 06/15/2021   Multiple myeloma (HCC) 03/05/2021   Multiple myeloma not having achieved remission (HCC) 03/05/2021   Bone lesion 02/14/2021   Monoclonal gammopathy 02/14/2021   High cholesterol    Allergic rhinitis 11/23/2020   Mild persistent asthma 11/23/2020   SOB (shortness of breath) 10/04/2011    Onset date: 05/15/23 (referral date)  REFERRING DIAG: R49.0 (ICD-10-CM) - Dysphonia  THERAPY DIAG: Other voice and resonance disorders  Dysphagia, unspecified type  Rationale for Evaluation and Treatment: Rehabilitation  SUBJECTIVE:   SUBJECTIVE STATEMENT: Entered with wife and portable oxygen, Pt shared "doesn't require support from oxygen as often when seated and relaxed." Accompanied by: spouse  PERTINENT HISTORY: "Samuel [name] is a 68 y.o. male who presents as a return  patient, for follow-up of sinusitis and globus sensation. At time of last visit on 03/14/2023, nasal culture was obtained following thick mucoid drainage noted in the right nasal cavity. This was abnormal, and demonstrated gram-positive bacilli. Patient was subsequently treated with a 2-week course of oral antibiotics, and presents today for posttreatment imaging. He has continued to use proton pump inhibitor twice daily, but continues to experience frequent throat clearing and globus  sensation. He states he has been working on dietary and lifestyle modifications as previously instructed, and has been partially successful. He still has difficulty with frequent throat clearing and needing to cough often. He also has not been able to obtain humidification for his oxygen, as the DME company stated they needed a prescription.  Last visit 03/14/2023: Rodricus Candelaria is a 68 y.o. male who presents as a new consult, referred by [name], FNP , for evaluation and treatment of sinus issues, hoarseness, and throat symptoms. He is accompanied by his wife.  Patient had history of multiple myeloma, status post treatment with chemotherapy. He developed interstitial lung disease following chemotherapeutic treatment, and is currently followed closely by pulmonology. He also endorses history of significant environmental allergies, and reports that recent allergy testing showed positivity to "everything". He was previously on immunotherapy, but this was held during his cancer treatment. He is considering starting back on the chemotherapy due to the significance of his symptoms. He is currently using Singulair, antihistamine and Patanase nasal spray on a daily basis. He also reports using nasal saline irrigations on a daily basis. He reports history of deviated nasal septum repair in 1983. He denies history of sinusitis requiring treatment with oral antibiotics in the last 12 months. His primary symptoms are nasal congestion and postnasal drainage. He denies facial pressure, pain, hyposmia, hypogeusia. He has not had any imaging of his head or sinuses. He does occasionally experience headaches. He uses oxygen on a nightly basis, and states that his oxygen is currently not humidified.  He reports symptoms of silent reflux, and states he is currently taking omeprazole on a daily basis. [name] is frequent throat clearing and globus sensation. He also reports occasional hoarseness."  PAIN: Are you  having pain? No  FALLS: Has patient fallen in last 6 months? No  LIVING ENVIRONMENT: Lives with: lives with their family Lives in: House/apartment  PLOF:Level of assistance: Independent with ADLs, Independent with IADLs Employment: Full-time employment  PATIENT GOALS: "control my breath, control my speech, and relaxation"   OBJECTIVE:   DIAGNOSTIC FINDINGS: MBSS 11/05/23:  Pt's oropharyngeal swallow is functional with adequate efficiency and safety. He has trace amounts of residue in his pyriform sinuses after the swallow, noted more with liquids than with solids. He does not have a lot of anterior hyoid excursion but he has no other overt pharyngeal deficits. No penetration or aspiration observed. Note that pt did report feeling like the purees and solids got stuck in his throat, which elicited coughing and throat clearing in an attempt to clear. When correlated with imaging, there was no residual in his pharynx. Question a referrred sensation and primary esophageal component (pt having esophagram after this study). Would continue regular solids and thin liquids with use of esophageal precautions. Would defer f/u to primary OP SLP.  ENT 04/04/23:  "Nasolaryngoscopy performed at last visit demonstrated moderate interarytenoid edema with otherwise normal upper airway exam. Counseled patient to continue twice daily omeprazole for next 2-3 months. We discussed dietary lifestyle modifications for treatment of reflux to include avoiding  spicy and acidic foods which exacerbate reflux, avoiding late meals and snacking, avoiding caffeine and stimulants, avoiding carbonated and alcoholic beverages, adequate oral hydration, avoidance of throat clearing and sleeping with the head of bed elevated. Recommended follow-up with gastroenterology if symptoms do not improve after 3 months of twice daily PPI. Offered referral to speech-language pathology due to continued hoarseness and frequent throat clearing,  patient was agreeable, order placed today."   ENT 10/07/23 "Chronic Cough and Throat Clearing Chronic cough and throat clearing, likely multifactorial including post-nasal drip, chronic GERD, and vocal fold atrophy. Symptoms exacerbated by interstitial lung disease and nasal cannula O2 requirement. Examination revealed pooling of secretions in pyriform sinuses concerning for dysphagia. 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  Discussed voice therapy as first-line therapy. Reviewed potential benefits and risks of current medications and new treatments such as steroid nasal rinses and switching antihistamines. - Order swallow study to evaluate for dysphagia (MBS esophagram) - Refer to speech therapy for voice therapy for vocal fold atrophy glottic insufficiency - Continue Azelastine for chronic nasal congestion - Switch from Claritin to Zyrtec - Add budesonide or mometasone to nasal rinses - Use Vaseline to prevent nasal dryness - Continue omeprazole and reflux raft, especially at night - Continue Delsym and Mucinex as needed for cough   Chronic Nasal Congestion Post-Nasal Drip Post-nasal drip contributing to chronic cough and throat clearing. Examination showed clear drainage in the back of the nose and nasal irritation likely due to allergies and O2 supplementation via nasal cannula. Discussed benefits of saline nasal rinses and potential for steroid nasal rinses to cover a larger surface area. Informed about slight risk of epistaxis with oxygen therapy and importance of using Vaseline to prevent nasal dryness. - Continue saline nasal rinses - Add budesonide or mometasone to nasal rinses - Switch from Claritin to Zyrtec - Continue azelastine 2 puffs b/l nares twice daily - Use Vaseline to prevent nasal dryness   Chronic Gastroesophageal Reflux Disease (GERD) GERD contributing to chronic throat clearing and cough.  Discussed importance of taking omeprazole and  reflux raft, especially at night, and potential benefits of using reflux raft after large meals during the day. 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 Examination revealed age-appropriate thinning of the vocal folds but vocal folds were mobile without masses or lesions.  - Refer to speech therapy for voice therapy/"    PATIENT REPORTED OUTCOME MEASURES (PROM): Communication Participation Item Bank: 9 "quite a bit" for all situations  but "very much" for getting turn in fast moving conversation  TODAY'S TREATMENT:         11/17/23: Enters with portable oxygen. Doffed when O2 at comfortable level. Consuming Reflux Raft as recommended, with reported benefit. ST provided strategies to aid consistent administration of PPI before breakfast. Recent MBSS was [facility]. Reported overall mild improvement in breathing and voice since last session. Re-educated recommended voice techniques from prior sessions, including focusing on "forward focused speech"/forward focused resonance. ST encouraged upright posture and whole body relaxation. Introduced and modeled circumlaryngeal massage x1 and diaphragmatic breathing to target relaxation of muscles surrounding the larynx and improved breath support. ST then introduced flow phonation targeting optimized airflow and reduced laryngeal tension. Pt participated effectively, experienced some difficulty relaxing, noted "holding breath" in between inhale  and exhale. Pt performance improved with prompting, modeling and and mod-A. ST plans to facilitate practice with flow phonation exercises in future sessions. Increased but inconsistent clear vocal quality noted at end of session.  11/03/23: Entered with portable oxygen (pulse; 3 L). Wife, Zella Ball,  present this session. Desaturated to 87% following short walk to SLP office. Steadily rose to 91% and 98% given rest break. Voice c/b hoarse, strained quality with dysfluent rate associated with impaired breath support. Did mildly improve throughout session; however, limited maintenance noted d/t impaired breath support. Continues to complain of "throat congestion" being main barrier to improved voicing. Dicussed previously recommended vocal hygiene practices, including reduction of throat clearing, increased hydration to thin mucous, and strict adherence to reflux recommendations. Inconsistent but increasing carryover of recommendations reported over last week, with benefit. 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 Scheduled for MBSS in two days per patient report of dysphagia to ENT. Will review documentation and recommendations and modify POC accordingly. May benefit from FEES d/t pt complaint of pharyngeal congestion. Recert completed d/t decline in speech and recent concern for dysphagia.   10-08-23: Overt SOB and hoarseness exhibited this session, which was notably worse than last session. Recommended f/u with pulmonologist d/t persistent difficulty breathing impacting voice and overall functioning. Recently saw ENT and pulmonary rehab. Re-educated ENT results and recommendations, including being increasingly intentional with reducing throat clearing and taking reflux raft for reflux management. Suggested gargling with water after inhaler use d/t SLP research re: dysphonia and inhalers. Plan to initiate new interventions, circumlaryngeal message and PhoRTE, next session pending pt respiratory baseline to address newly diagnosed vocal fold atrophy and supraglottic compression.   09-24-23: Visited ED on 09/12/23 d/t shortness of breath. Began course of antibiotics for suspected  bronchitis/pneumonia and symptoms have resolved. Despite recent respiratory challenges, pt reports wanting to continue IMST exercises as he has been completing daily HEP and feels it is significantly improving respiratory strength. Additionally, a friend provided positive affirmation of voice strength and quality over 2 hour conversation. In previous session, SLP set device to 26.25 cm H20 based on MIP calculation and instructed pt not to adjust it, however the device was set to 33 cm H20 when pt entered. Reassessed MIP today across 3 trials: 80, 58, 75. Adjusted IMST device to 75% of MIP (80): 60 cm H20 (SLP set to max setting: 41 cm H20). Pt demo'd 25 reps with rare min A. Reviewed reflux precautions as pt endorses increased globus sensation/congestion in throat. Pt would like f/u with ENT for second opinion; requested today. Recommended inquiring about pulmonary rehab at upcoming appointment d/t success with IMST thus far. Re-assess MIP next session.   09-08-23: Continues to c/o significant asthma impacting respiratory function and subsequently voice. Initiated education and instruction of Administrator, arts (IMST) to address impaired breath support impacting vocal intensity and clarity. Measured MIP across 3 trials: 35, 29, 17. Modified provided IMST device to 75% of MIP (35): 26.25 cm H2O. Instructed patient on how to use device and perform recommended exercises (see pt instructions for HEP). Pt able to demo 25 reps with occasional min A. Current device setting was deemed appropriate. Re-assess MIP next session.   08-26-23: Reports feeling under the weather during the past week and experiencing congestion from asthma today. SLP led discussion about preparing for work meetings occurring after ST. Recommended taking 5 to 10 minutes  before meetings to reset breathing and prepare for prolonged speech. Pt identified x2 strategies to focus on during meeting with mod I. In 30 minute unstructured  conversation, pt experienced occasional wet vocal quality, likely due to congestion, but independently self-corrected. Otherwise maintained strong breath support and clear vocal quality with rare min A and x3 cough/throat clears. Pt endorses overall improvements in conversational speech, but wants to continue targeting respiratory support.   08-12-23: Reports ongoing improvements in voice as well as breathing with less frequent cues provided by wife to use strategies. Continue to target use of diaphragmatic breathing, intentional upper body relaxation, and forward projection of voice in dual tasking scenarios. Pt aware of increased tension and reduced abdominal breathing during transition periods that include dividing attention between talking and other task (walking, answering phone). Focused on being intentional with relaxation and breath support to improve vocal quality and intensity during targeted dual tasking activities. Occasional min cues required to reduce tension, optimize breathing pattern, and project voice during these tasks.   08-06-23: Entered with hoarse vocal quality that improved independently, with pt stating that he focused on using forward resonance. Maintained relaxed appearance, strong breath support, and clear vocal quality during 20-minute unstructured conversation. SLP directed pt to walk down the hall and return to therapy room to read a short passage x2 to target increased breath support and clear vocal quality in speech following exertion. Successfully completed task with occasional fading to rare min A, with improved breath support and vocal quality noted in the 2nd attempt. Benefited from cues to reset before beginning to read and take intentional pauses between sentences. Pt self-rated first attempt a 7.5 and second attempt an 8.5-9 out of 10. Addressed breath support during exertion by completing walking and talking task. Pt maintained structured conversation while completing  laps around therapy gym with occasional mod fading to min A to take breaths in between sentences and stay relaxed. Increased success noted in additional trial.   07-29-23: Reported ongoing HEP completion. Endorsed overall improvement of SOVT exercises, although some days are better than others. Targeted SOVTE to reduce vocal tension and improve clarity. Completed sustained exhale, hum, and glides into water with occasional min A to reduce upper body tension and take reset breaths. Introduced straw talking exercise with short phrases to improve forward resonance. Used negative practice to alternate between back-focused voice and forward resonance to improve awareness of vocal quality. Accurately read aloud a list of 8-9 word sentences using forward resonance with occasional min A. Back-focused voice more apparent at the end of sentences. Benefited from cues to break up long sentences with a pause and reset with a straw or hum.  07-23-23: Entered with clear vocal quality and reported completing SOVT HEP intermittently. Targeted SOVT exercises to reduce tension in vocal folds and improve vocal quality. Aware he can sustain exhale longer than before during exercises (averaged 6 sec for breath only). Completed hum, accents, and happy birthday into water given occasional fading to rare min A to relax upper body tension and increase breath support. Maintained 15 minute conversation with clear vocal quality and minimal upper body tension independently. Pt endorsed increasing awareness of body tension in the community; however, his wife occasionally cues him to relax which causes frustration. SLP counseled pt that the strategies he is learning will take time to master in the community. No coughing or throat clearing observed throughout session.    PATIENT EDUCATION: Education details: see above  Person educated: Patient and Spouse Education method:  Explanation and Demonstration Education comprehension: verbalized  understanding, returned demonstration, and needs further education  HOME EXERCISE PROGRAM: Abdominal breathing, forward resonance  GOALS: Goals reviewed with patient? Yes  SHORT TERM GOALS: Target date: 06/23/2023 (12/01/2023 for recert)  Pt will reduce throat clearing and coughing by 50% at STG date using trained techniques Baseline: > 9, 6 respectively; 1/0 at STG date Goal status: MET  2.  Pt will employ abdominal breathing during structured tasks with 80% accuracy given occasional min A  Baseline:  Goal status: MET  3.  Pt will employ abdominal breathing during structured conversation with 80% accuracy given occasional min A Baseline:  Goal status: MET  4.  Pt will maintain clear vocal quality in 5-10 minute structured conversations x2 given occasional min A  Baseline:  Goal status: MET  5.  Pt will carryover recommendations and complete HEP as recommended x3 sessions  Baseline:  Goal status: ONGOING (at recert)  6.  Pt will achieve clear vocal clarity with use of trained techniques in short, structured conversations 3-5 mins x2 sessions  Baseline:  Goal status: ONGOING (at recert)  7.  Pt will demonstrate improved swallow function with recommended strategies and/or exercises given rare min A x2 sessions Baseline:  Goal status: ONGOING  (at recert)    LONG TERM GOALS: Target date: 07/21/2023 (12/29/2023 for recert)   Pt will reduce throat clearing and coughing by 75% at LTG date using trained techniques Baseline:  Goal status: MET  2.  Pt will employ abdominal breathing during unstructured conversation with 80% accuracy given rare min A Baseline:  Goal status: MET  3.  Pt will maintain clear vocal quality in 15+ minute unstructured conversations x2 given rare min A  Baseline: 07-23-23, 08-06-23 Goal status: MET   4.  Pt will carryover trained voice techniques during community outings x2 with less frequent cues required by wife  Baseline: 08-12-23, 09-24-23 Goal  status: MET  5.  Pt will complete IMST HEP with rare min A resulting in increased MIP at 4 weeks Baseline: MIP=26.25 Goal status: MET  6.  Pt will achieve clear vocal clarity with use of trained techniques in unstructured conversations 10+ minutes x2 session  Baseline:  Goal status: ONGOING (at recert)  7.  Pt will report improved communication effectiveness via PROM by 2 pts at last ST session  Baseline: CPIB=9 Goal status: IN PROGRESS (at recert)  8.  Pt will report improved swallow function with recommended strategies and/or exercises at home with mod I Baseline:  Goal status: ONGOING (at recert)  ASSESSMENT:  CLINICAL IMPRESSION: Patient is a 68 y.o. M who was seen today for dysphonia. PMHX significant for Multiple Myeloma, interstitial lung disease, and GERD. Recent decline in health has impacted vocal clarity, and respiratory support for talking and swallowing. MBSS displayed no cause for concern and sufficient swallow. Due to dx of VF atrophy and supraglottic compression, pt continues to benefit from additional education and training of additional voice exercises to optimize vocal quality. Pt continuing to benefit from skilled ST intervention, such as training vocal hygiene protocol and voice techniques to optimize vocal quality and communication effectiveness.   OBJECTIVE IMPAIRMENTS: include voice disorder. These impairments are limiting patient from effectively communicating at home and in community. Factors affecting potential to achieve goals and functional outcome are co-morbidities. Patient will benefit from skilled SLP services to address above impairments and improve overall function.  REHAB POTENTIAL: Good  PLAN:  SLP FREQUENCY: 1-2x/week (pt elects for every other week)  SLP DURATION: 8 weeks (+ 8 additional weeks for recert)  PLANNED INTERVENTIONS: Language facilitation, Environmental controls, Cueing hierachy, Internal/external aids, Functional tasks, Multimodal  communication approach, SLP instruction and feedback, Compensatory strategies, Patient/family education, and Re-evaluation    Gracy Racer, CCC-SLP 11/17/2023, 11:06 AM

## 2023-11-18 ENCOUNTER — Encounter (HOSPITAL_COMMUNITY): Admission: RE | Admit: 2023-11-18 | Payer: Medicare Other | Source: Ambulatory Visit

## 2023-11-18 ENCOUNTER — Telehealth (HOSPITAL_COMMUNITY): Payer: Self-pay

## 2023-11-18 NOTE — Telephone Encounter (Signed)
Called to check on Jim. Left VM with department number.

## 2023-11-18 NOTE — Telephone Encounter (Signed)
[name] returned my call. He is feeling somewhat better. His E tanks have been delivered. [name] is wanting to return to [place] on 12/02/23. He is currently still walking and feels this return date will suit his schedule best. Kerin Perna I will call him if I don't see him on 2/11. [name] voiced understanding.

## 2023-11-18 NOTE — Telephone Encounter (Signed)
Duplicate message closing this encounter

## 2023-11-20 ENCOUNTER — Other Ambulatory Visit: Payer: Medicare Other

## 2023-11-20 ENCOUNTER — Encounter (HOSPITAL_COMMUNITY): Payer: Medicare Other

## 2023-11-20 ENCOUNTER — Other Ambulatory Visit: Payer: Medicare Other | Admitting: Internal Medicine

## 2023-11-20 DIAGNOSIS — J4531 Mild persistent asthma with (acute) exacerbation: Secondary | ICD-10-CM

## 2023-11-24 NOTE — Telephone Encounter (Signed)
[facility] asking for records to be faxed, but they have access to Epic

## 2023-11-24 NOTE — Telephone Encounter (Signed)
Boneta Lucks from [employer] states needs 6 minute walk test and office notes for oxygen. Boneta Lucks phone number is [phone]. Fax number is [phone].

## 2023-11-25 ENCOUNTER — Encounter (HOSPITAL_COMMUNITY): Payer: Medicare Other

## 2023-11-25 NOTE — Telephone Encounter (Signed)
Minta Balsam; Kathyrn Sheriff; Darcel Smalling I will send it to the team. I will let you know if anything else is needed.  Nothing else needed at this time

## 2023-11-26 LAB — RESPIRATORY CULTURE OR RESPIRATORY AND SPUTUM CULTURE: MICRO NUMBER:: 16019816

## 2023-11-27 ENCOUNTER — Encounter (HOSPITAL_COMMUNITY)
Admission: RE | Admit: 2023-11-27 | Discharge: 2023-11-27 | Disposition: A | Discharge status: Home / Self Care | Payer: Medicare Other | Source: Ambulatory Visit | Attending: Internal Medicine | Admitting: Internal Medicine

## 2023-11-27 DIAGNOSIS — J849 Interstitial pulmonary disease, unspecified: Secondary | ICD-10-CM | POA: Insufficient documentation

## 2023-11-28 ENCOUNTER — Ambulatory Visit (INDEPENDENT_AMBULATORY_CARE_PROVIDER_SITE_OTHER): Payer: Medicare Other | Admitting: Adult Health

## 2023-11-28 ENCOUNTER — Encounter: Payer: Self-pay | Admitting: Adult Health

## 2023-11-28 VITALS — BP 120/75 | HR 63 | Ht 70.0 in | Wt 206.6 lb

## 2023-11-28 DIAGNOSIS — J849 Interstitial pulmonary disease, unspecified: Secondary | ICD-10-CM | POA: Diagnosis not present

## 2023-11-28 DIAGNOSIS — J4530 Mild persistent asthma, uncomplicated: Secondary | ICD-10-CM

## 2023-11-28 DIAGNOSIS — C9000 Multiple myeloma not having achieved remission: Secondary | ICD-10-CM | POA: Diagnosis not present

## 2023-11-28 DIAGNOSIS — J9611 Chronic respiratory failure with hypoxia: Secondary | ICD-10-CM | POA: Diagnosis not present

## 2023-11-28 MED ORDER — PREDNISONE 5 MG PO TABS
ORAL_TABLET | ORAL | 1 refills | Status: DC
Start: 2023-11-28 — End: 2024-03-19

## 2023-11-28 NOTE — Assessment & Plan Note (Signed)
 Slightly decreased oxygen demand.  Continue on oxygen to maintain O2 saturations greater than 88 to 90%  Plan  Patient Instructions  Continue on Prednisone  5mg  daily for 3 weeks and then every other day until seen back in office.  Liquid Mucinex Twice daily  As needed   Flutter valve Twice daily   Delsym 2 tsp Twice daily for cough As needed   Tessalon  Three times a day  for cough As needed   Continue on Breztri  2 puffs Twice daily  , rinse after use.  Albuterol  inhaler /neb As needed   Continue on Singulair  daily  Pulmonary rehab as planned.  CT chest next month as planned.  Continue on oxygen 3 L at rest and 8 L with exercise. Follow up with Dr. Geronimo in 6 weeks and As needed   Please contact office for sooner follow up if symptoms do not improve or worsen or seek emergency care

## 2023-11-28 NOTE — Assessment & Plan Note (Signed)
 Continue follow-up with oncology

## 2023-11-28 NOTE — Progress Notes (Signed)
 @Patient  ID: Samuel [name], male    DOB: [birth year], 68 y.o.   MRN: [id number]  Chief Complaint  Patient presents with   Follow-up    Referring provider: Seabron Lenis, MD  HPI: 68 year old male former smoker followed for ILD, drug-induced pneumonitis (multiple myeloma drug treatment) and chronic respiratory failure and asthma Medical history significant for multiple myeloma diagnosed in 2022 followed by oncology  TEST/EVENTS :  06/16/2021 echo: EF 65-70%, LVH. RV size and function nl 2D echo that was completed on July 01, 2023 that showed preserved EF and grade 1 diastolic dysfunction.   06/15/2021 PFT FVC 33, FEV1 37, ratio 83, TLC 59, DLCOcor 46.  No BD 10/16/2021 PFT: FVC 29, FEV1 36, ratio 92, DLCO corrected 37 12/10/2021 PFT: FVC 32, FEV1 40, ratio 94, DLCO corrected 34 06/06/2022 PFT: FVC 30, FEV1 36, ratio 88, DLCO corrected for alveolar volume 51 11/04/2022 PFT: FVC 28, FEV1 32, ratio 85, DLCOcor 37 05/2023 FVC 26%, DLCO 35  0/07/2021 HRCT chest:Mediastinal lymph nodes are decreased in size when compared to prior.  Ce it ntral airways are patent.  Mild diffuse groundglass opacity with peribronchovascular and subpleural reticular glass opacities as well as traction bronchiectasis.  No clear craniocaudal gradient.  There is mild bilateral air trapping.  There is possible honeycomb change of the anterior left upper lobe.  There is a stable solid right middle lobe pulmonary nodule measuring 3 mm.  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 01/06/2022 CTA chest: No evidence of PE.  There is cardiomegaly and a small pericardial effusion.  Lung volumes are small.  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  No acute process noted. High-resolution CT chest November 25, 2022 shows diffuse reticular opacities with associated traction bronchiectasis, no definite evidence of progression, previous diffuse groundglass opacities from October 2022 and August  2022 have resolved.  Findings are indeterminate for UIP   bronchoscopy performed in 06/18/2021.  Eosinophils 51%, lymphs 18%, no malignant cells identified patient was started on a long-term prednisone  taper. -Significant eosinophilia in the BAL the bronc is suggestive of drug-induced pneumonitis.    OFEV  stopped in 2022 -intolerant  Esbriet  stopped in October 2023 due to inability to tolerate due to GI side effects     # IgG Kappa Multiple Myeloma 02/22/2021: bone marrow biopsy confirms the diagnosis of Multiple Myeloma with a monoclonal plasma cell population.   11/28/2023 Follow up ; ILD, drug-induced pneumonitis, chronic respiratory failure, asthma Patient presents for a 1 month follow-up.  Patient is followed for interstitial lung disease in the setting of drug-induced pneumonitis but disease felt secondary to multiple myeloma drug treatment) patient was seen last visit after a progressive clinical decline with decreased activity tolerance and increased oxygen requirement.  PFTs in August showed decreased lung capacity.  2D echo in September 2024 showed preserved EF and grade 1 diastolic dysfunction.  Lab work with an elevated D-dimer with subsequent CT angio was negative for PE.  Showed mild diffuse groundglass opacities slightly increased in the bases.  And stable lesions on the ribs and scapula.  He was treated for a possible asthmatic bronchitis in early December with prednisone  and a Z-Pak.  He had ongoing symptoms and was called in Levaquin .  Last visit patient did have improvement in cough and congestion however continued to have decreased activity tolerance and increased shortness of breath.  He was placed on a low prednisone  burst with prednisone  20 mg for 1 week, 10 mg for 2 weeks and  then 5 mg daily. He returns today with some improvement.  Patient says he has been able to do slightly more activities and has been able to turn oxygen down to 1 L at rest and 3 L with activity.  He still uses 8  to 9 L with exercise on the treadmill.  A modified barium swallow that showed normal swallow.  Some mild dysmotility.  He is going to see speech therap/ENT for an evaluation.  He also has been referred to pulmonary rehab and begin this week he denies any increased cough, congestion.  Chest x-ray last visit showed stable chronic changes.  Sputum culture with an adequate.  He has an upcoming high-resolution CT chest in March  Allergies  Allergen Reactions   Clarithromycin Other (See Comments)    Hiccups   Ofev  [Nintedanib] Other (See Comments)    GI bleeding   Penicillins Other (See Comments)    Child hood unsure    Immunization History  Administered Date(s) Administered   Fluad Quad(high Dose 65+) 08/09/2021, 08/01/2022, 07/22/2023   Influenza Split 10/27/2017   Influenza, High Dose Seasonal PF 12/01/2017, 11/29/2019, 12/19/2020, 12/24/2021   Influenza, Quadrivalent, Recombinant, Inj, Pf 07/28/2019, 08/11/2020   Influenza-Unspecified 11/26/2011, 07/21/2017   PFIZER(Purple Top)SARS-COV-2 Vaccination 12/27/2019, 01/24/2020, 09/21/2020   Tdap 08/20/2005, 09/07/2015   Zoster, Live 11/06/2017, 05/07/2018    Past Medical History:  Diagnosis Date   Asthma    GERD (gastroesophageal reflux disease)    High cholesterol    under control.    History of blood transfusion    Hypothyroidism    Interstitial lung disease (HCC)    Multiple myeloma (HCC)    Perennial allergic rhinitis    Pneumonia    Sciatic pain, right    Seasonal allergic rhinitis    Thyroid  disease     Tobacco History: Social History   Tobacco Use  Smoking Status Former   Current packs/day: 0.00   Average packs/day: 0.1 packs/day for 3.0 years (0.3 ttl pk-yrs)   Types: Cigarettes   Start date: 10/22/1979   Quit date: 10/21/1982   Years since quitting: 41.1  Smokeless Tobacco Never   Counseling given: Not Answered   Outpatient Medications Prior to Visit  Medication Sig Dispense Refill   acyclovir  (ZOVIRAX ) 400 MG  tablet Take 1 tablet (400 mg total) by mouth 2 (two) times daily. 60 tablet 3   albuterol  (PROVENTIL ) (2.5 MG/3ML) 0.083% nebulizer solution USE ONE VIAL IN NEBULIZER EVERY 6 HOURS AS NEEDED WHEEZING OR SHORTNESS OF BREATH 120 mL 12   albuterol  (VENTOLIN  HFA) 108 (90 Base) MCG/ACT inhaler Inhale 2 puffs into the lungs every 6 hours as needed for wheezing or shortness of breath. 8.5 g 1   ALPRAZolam  (XANAX ) 0.5 MG tablet Take 0.5 mg by mouth at bedtime.     ARMOUR THYROID  90 MG tablet Take 90 mg by mouth daily.     aspirin  EC 81 MG tablet Take 1 tablet (81 mg total) by mouth daily. Swallow whole. 90 tablet 3   ASSESS FULL RANGE PEAK METER DEVI as directed.     azelastine  (ASTELIN ) 0.1 % nasal spray Place 2 sprays into both nostrils 2 (two) times daily. Use in each nostril as directed 30 mL 12   bisoprolol  (ZEBETA ) 5 MG tablet Take 1 tablet (5 mg total) by mouth daily. 30 tablet 2   Budeson-Glycopyrrol-Formoterol (BREZTRI  AEROSPHERE) 160-9-4.8 MCG/ACT AERO Inhale 2 puffs into the lungs in the morning and at bedtime. 10.7 g 11   cetirizine  (ZYRTEC )  10 MG tablet Take 1 tablet (10 mg total) by mouth daily. 30 tablet 11   cholecalciferol (VITAMIN D) 1000 UNITS tablet Take 3,000 Units by mouth daily.     Dextromethorphan HBr (DELSYM PO) Take by mouth as needed. For cough     dextromethorphan-guaiFENesin (MUCINEX DM) 30-600 MG 12hr tablet Take 1 tablet by mouth 2 (two) times daily.     EPINEPHrine  (EPI-PEN) 0.3 mg/0.3 mL DEVI Inject 0.3 mg into the muscle as needed.     ezetimibe -simvastatin  (VYTORIN ) 10-40 MG tablet Take 1 tablet by mouth daily. 90 tablet 3   fluticasone  (FLONASE ) 50 MCG/ACT nasal spray 1 spray.     Guaifenesin 1200 MG TB12      hydrOXYzine  (ATARAX ) 25 MG tablet Take 25 mg by mouth every 8 (eight) hours as needed.     ibuprofen (ADVIL) 200 MG tablet Take 200 mg by mouth every 6 (six) hours as needed.     ketoconazole (NIZORAL) 2 % cream SMARTSIG:1 Application Topical 1 to 2 Times Daily      levothyroxine  (SYNTHROID ) 50 MCG tablet Take 50 mcg by mouth daily before breakfast.     montelukast  (SINGULAIR ) 10 MG tablet Take 10 mg by mouth at bedtime.     Multiple Vitamin (MULTIVITAMIN) tablet Take 1 tablet by mouth daily.     omeprazole  (PRILOSEC) 40 MG capsule Take 1 capsule (40 mg total) by mouth 2 (two) times daily. Take 30 minutes before a meal 60 capsule 3   OXYGEN Inhale 2 L into the lungs continuous. As needed     sildenafil (REVATIO) 20 MG tablet Take 20 mg by mouth daily as needed (ED).     Simethicone  (SIMETHICONE  ULTRA STRENGTH) 180 MG CAPS Take 1 capsule (180 mg total) by mouth 3 (three) times daily as needed. 90 capsule 0   sodium chloride  (OCEAN) 0.65 % SOLN nasal spray Place 1 spray into both nostrils as needed for congestion.     tamsulosin  (FLOMAX ) 0.4 MG CAPS capsule Take 0.4 mg by mouth daily.     triamcinolone  cream (KENALOG ) 0.1 % Apply 1 application topically daily as needed (sun burn itch).     Turmeric 400 MG CAPS 1 capsule Orally once a day     UNABLE TO FIND Med Name: Allergy  injections once weekly     predniSONE  (DELTASONE ) 10 MG tablet 2 tabs daily for 1 week then 1 tab daily for 2 weeks and 1/2 tab daily until back in office. 60 tablet 0   No facility-administered medications prior to visit.     Review of Systems:   Constitutional:   No  weight loss, night sweats,  Fevers, chills, +fatigue, or  lassitude.  HEENT:   No headaches,  Difficulty swallowing,  Tooth/dental problems, or  Sore throat,                No sneezing, itching, ear ache, nasal congestion, post nasal drip,   CV:  No chest pain,  Orthopnea, PND, swelling in lower extremities, anasarca, dizziness, palpitations, syncope.   GI  No heartburn, indigestion, abdominal pain, nausea, vomiting, diarrhea, change in bowel habits, loss of appetite, bloody stools.   Resp: .  No chest wall deformity  Skin: no rash or lesions.  GU: no dysuria, change in color of urine, no urgency or  frequency.  No flank pain, no hematuria   MS:  No joint pain or swelling.  No decreased range of motion.  No back pain.    Physical Exam  BP 120/75 (BP Location: Left Arm, Patient Position: Sitting, Cuff Size: Large)   Pulse 63   Ht 5' 10 (1.778 m)   Wt 206 lb 9.6 oz (93.7 kg)   SpO2 96%   BMI 29.64 kg/m   GEN: A/Ox3; pleasant , NAD, well nourished , on O2    HEENT:  [place]/AT,  NOSE-clear, THROAT-clear, no lesions, no postnasal drip or exudate noted.   NECK:  Supple w/ fair ROM; no JVD; normal carotid impulses w/o bruits; no thyromegaly or nodules palpated; no lymphadenopathy.    RESP  BB crackles  no accessory muscle use, no dullness to percussion  CARD:  RRR, no m/r/g, no peripheral edema, pulses intact, no cyanosis or clubbing.  GI:   Soft & nt; nml bowel sounds; no organomegaly or masses detected.   Musco: Warm bil, no deformities or joint swelling noted.   Neuro: alert, no focal deficits noted.    Skin: Warm, no lesions or rashes    Lab Results:  CBC    Component Value Date/Time   WBC 9.9 11/06/2023 0802   WBC 5.6 09/12/2023 1743   RBC 4.48 11/06/2023 0802   HGB 13.6 11/06/2023 0802   HCT 39.8 11/06/2023 0802   PLT 249 11/06/2023 0802   MCV 88.8 11/06/2023 0802   MCH 30.4 11/06/2023 0802   MCHC 34.2 11/06/2023 0802   RDW 12.7 11/06/2023 0802   LYMPHSABS 2.1 11/06/2023 0802   MONOABS 0.8 11/06/2023 0802   EOSABS 0.8 (H) 11/06/2023 0802   BASOSABS 0.0 11/06/2023 0802    BMET    Component Value Date/Time   NA 134 (L) 11/06/2023 0802   K 3.6 11/06/2023 0802   CL 97 (L) 11/06/2023 0802   CO2 31 11/06/2023 0802   GLUCOSE 89 11/06/2023 0802   BUN 18 11/06/2023 0802   CREATININE 0.90 11/06/2023 0802   CALCIUM  9.3 11/06/2023 0802   GFRNONAA >60 11/06/2023 0802    BNP    Component Value Date/Time   BNP 32.3 06/15/2021 1416    ProBNP    Component Value Date/Time   PROBNP 35.0 09/12/2023 1018    Imaging: DG ESOPHAGUS W SINGLE CM (SOL OR THIN  BA) Result Date: 11/05/2023 CLINICAL DATA:  Patient with a history of multiple myeloma with interstitial lung disease presents today with coughing during meals, raspy voice and globus sensation EXAM: ESOPHAGUS/BARIUM SWALLOW/TABLET STUDY TECHNIQUE: Combined double and single contrast examination was performed using effervescent crystals, high-density barium, and thin liquid barium. This exam was performed by APP name, and was supervised and interpreted by Rad name. FLUOROSCOPY: Radiation Exposure Index (as provided by the fluoroscopic device): 29.50 mGy Kerma COMPARISON:  None Available. FINDINGS: Swallowing: Appears normal. No vestibular penetration or aspiration seen. Pharynx: Unremarkable. Esophagus: Normal appearance. Esophageal motility: Mild dysmotility with intermittent tertiary contractions. Hiatal Hernia: None. Gastroesophageal reflux: None visualized. Ingested 13mm barium tablet: Passed normally Other: None. IMPRESSION: Mild dysmotility with intermittent tertiary contractions. Otherwise normal study. Electronically Signed   By: Ester Sides M.D.   On: 11/05/2023 15:33   DG SWALLOW FUNC OP MEDICARE SPEECH PATH Result Date: 11/05/2023 CLINICAL DATA:  Patient with a history of multiple myeloma and interstitial lung disease presents today with complaints of coughing during meals, raspy voice and globus sensation. EXAM: MODIFIED BARIUM SWALLOW TECHNIQUE: Different consistencies of barium were administered orally to the patient by the Speech Pathologist. Imaging of the pharynx was performed in the lateral projection. Warren Dais NP was present in the fluoroscopy room during this study,  which was supervised and interpreted by Dr. Jennefer. Radiologist, not in attendance for the exam. Different consistencies of barium were administered orally to the patient by the Speech Pathologist. Imaging of the pharynx was performed in the lateral projection. The radiologist was present in the fluoroscopy room for this  study, providing personal supervision. FLUOROSCOPY: Radiation Exposure Index (as provided by the fluoroscopic device): 8.10 mGy Kerma COMPARISON:  None Available. FINDINGS: Vestibular  Penetration:  None seen. Aspiration:  None seen. Other:  None. IMPRESSION: Normal swallow evaluation. Please refer to the Speech Pathologists report for complete details and recommendations. Electronically Signed   By: Ester Jennefer M.D.   On: 11/05/2023 15:32    Administration History     None          Latest Ref Rng & Units 06/12/2023   11:30 AM 01/17/2023    2:09 PM 09/26/2022   10:34 AM 08/01/2022    1:57 PM 06/06/2022    1:52 PM 12/10/2021    9:24 AM 10/16/2021    3:56 PM  PFT Results  FVC-Pre L 1.19  1.36  1.32  1.55  1.43  1.49  1.35   FVC-Predicted Pre % 26  29  28   33  30  32  29   Pre FEV1/FVC % % 88  90  85  94  88  94  92   FEV1-Pre L 1.05  1.23  1.12  1.46  1.26  1.40  1.24   FEV1-Predicted Pre % 30  36  32  42  36  40  36   DLCO uncorrected ml/min/mmHg 9.36  12.92  9.61  10.43  13.19  8.64  9.99   DLCO UNC% % 35  48  36  39  49  32  37   DLCO corrected ml/min/mmHg 9.36  13.27  9.96  10.58  13.77  9.20  10.04   DLCO COR %Predicted % 35  49  37  39  51  34  37   DLVA Predicted % 126  127  107  110  128  97  109     No results found for: NITRICOXIDE      Assessment & Plan:   ILD (interstitial lung disease) (HCC) Interstitial lung disease in the setting of drug-induced pneumonitis questionable recent flare with some perceived benefit and decreased oxygen demands on short prednisone  burst.  Will continue on prednisone  5 mg daily for the next 3 weeks and then every other day until seen back in the office at that time most likely will be able to discontinue.  Has a high-resolution CT chest planned in 1 month.  Will follow-up right after this to review results and evaluate clinical status.  Previously intolerant to Esbriet  and Ofev .  2D echo in follow-up 2024 showed grade 1 diastolic  dysfunction.  If clinical decline persist could consider right heart cath to rule out underlying pulmonary hypertension  Plan  Patient Instructions  Continue on Prednisone  5mg  daily for 3 weeks and then every other day until seen back in office.  Liquid Mucinex Twice daily  As needed   Flutter valve Twice daily   Delsym 2 tsp Twice daily for cough As needed   Tessalon  Three times a day  for cough As needed   Continue on Breztri  2 puffs Twice daily  , rinse after use.  Albuterol  inhaler /neb As needed   Continue on Singulair  daily  Pulmonary rehab as planned.  CT chest next month as planned.  Continue on oxygen 3 L at rest and 8 L with exercise. Follow up with Dr. Geronimo in 6 weeks and As needed   Please contact office for sooner follow up if symptoms do not improve or worsen or seek emergency care      Chronic respiratory failure with hypoxia (HCC) Slightly decreased oxygen demand.  Continue on oxygen to maintain O2 saturations greater than 88 to 90%  Plan  Patient Instructions  Continue on Prednisone  5mg  daily for 3 weeks and then every other day until seen back in office.  Liquid Mucinex Twice daily  As needed   Flutter valve Twice daily   Delsym 2 tsp Twice daily for cough As needed   Tessalon  Three times a day  for cough As needed   Continue on Breztri  2 puffs Twice daily  , rinse after use.  Albuterol  inhaler /neb As needed   Continue on Singulair  daily  Pulmonary rehab as planned.  CT chest next month as planned.  Continue on oxygen 3 L at rest and 8 L with exercise. Follow up with Dr. Geronimo in 6 weeks and As needed   Please contact office for sooner follow up if symptoms do not improve or worsen or seek emergency care      Mild persistent asthma Appears to be at baseline.  Continue on current regimen  Plan  . Patient Instructions  Continue on Prednisone  5mg  daily for 3 weeks and then every other day until seen back in office.  Liquid Mucinex Twice daily   As needed   Flutter valve Twice daily   Delsym 2 tsp Twice daily for cough As needed   Tessalon  Three times a day  for cough As needed   Continue on Breztri  2 puffs Twice daily  , rinse after use.  Albuterol  inhaler /neb As needed   Continue on Singulair  daily  Pulmonary rehab as planned.  CT chest next month as planned.  Continue on oxygen 3 L at rest and 8 L with exercise. Follow up with Dr. Geronimo in 6 weeks and As needed   Please contact office for sooner follow up if symptoms do not improve or worsen or seek emergency care      Multiple myeloma [facility]) Continue follow-up with oncology     Madelin Stank, NP 11/28/2023

## 2023-11-28 NOTE — Patient Instructions (Addendum)
 Continue on Prednisone  5mg  daily for 3 weeks and then every other day until seen back in office.  Liquid Mucinex Twice daily  As needed   Flutter valve Twice daily   Delsym 2 tsp Twice daily for cough As needed   Tessalon  Three times a day  for cough As needed   Continue on Breztri  2 puffs Twice daily  , rinse after use.  Albuterol  inhaler /neb As needed   Continue on Singulair  daily  Pulmonary rehab as planned.  CT chest next month as planned.  Continue on oxygen 3 L at rest and 8 L with exercise. Follow up with Dr. Geronimo in 6 weeks and As needed   Please contact office for sooner follow up if symptoms do not improve or worsen or seek emergency care

## 2023-11-28 NOTE — Assessment & Plan Note (Signed)
 Appears to be at baseline.  Continue on current regimen  Plan  . Patient Instructions  Continue on Prednisone  5mg  daily for 3 weeks and then every other day until seen back in office.  Liquid Mucinex Twice daily  As needed   Flutter valve Twice daily   Delsym 2 tsp Twice daily for cough As needed   Tessalon  Three times a day  for cough As needed   Continue on Breztri  2 puffs Twice daily  , rinse after use.  Albuterol  inhaler /neb As needed   Continue on Singulair  daily  Pulmonary rehab as planned.  CT chest next month as planned.  Continue on oxygen 3 L at rest and 8 L with exercise. Follow up with Dr. Geronimo in 6 weeks and As needed   Please contact office for sooner follow up if symptoms do not improve or worsen or seek emergency care

## 2023-11-28 NOTE — Assessment & Plan Note (Signed)
 Interstitial lung disease in the setting of drug-induced pneumonitis questionable recent flare with some perceived benefit and decreased oxygen demands on short prednisone  burst.  Will continue on prednisone  5 mg daily for the next 3 weeks and then every other day until seen back in the office at that time most likely will be able to discontinue.  Has a high-resolution CT chest planned in 1 month.  Will follow-up right after this to review results and evaluate clinical status.  Previously intolerant to Esbriet  and Ofev .  2D echo in follow-up 2024 showed grade 1 diastolic dysfunction.  If clinical decline persist could consider right heart cath to rule out underlying pulmonary hypertension  Plan  Patient Instructions  Continue on Prednisone  5mg  daily for 3 weeks and then every other day until seen back in office.  Liquid Mucinex Twice daily  As needed   Flutter valve Twice daily   Delsym 2 tsp Twice daily for cough As needed   Tessalon  Three times a day  for cough As needed   Continue on Breztri  2 puffs Twice daily  , rinse after use.  Albuterol  inhaler /neb As needed   Continue on Singulair  daily  Pulmonary rehab as planned.  CT chest next month as planned.  Continue on oxygen 3 L at rest and 8 L with exercise. Follow up with Dr. Geronimo in 6 weeks and As needed   Please contact office for sooner follow up if symptoms do not improve or worsen or seek emergency care

## 2023-12-01 ENCOUNTER — Ambulatory Visit: Payer: Medicare Other | Attending: Family Medicine

## 2023-12-01 DIAGNOSIS — R498 Other voice and resonance disorders: Secondary | ICD-10-CM | POA: Diagnosis present

## 2023-12-01 NOTE — Telephone Encounter (Signed)
 Pt has not had six minute walk If he needs qualifying walk please let us  know

## 2023-12-01 NOTE — Therapy (Signed)
 OUTPATIENT SPEECH LANGUAGE PATHOLOGY VOICE TREATMENT    Patient Name: Samuel [name] MRN: [id number] DOB:[birth year], 68 y.o., male Today's Date: 12/01/2023  PCP: Rae Bugler, MD REFERRING PROVIDER: Rae Bugler, MD  END OF SESSION:  End of Session - 12/01/23 0932     Visit Number 23    Number of Visits 30    Date for SLP Re-Evaluation 12/29/23    Authorization Type medicare/ BCBS    SLP Start Time [phone]    SLP Stop Time  0932    SLP Time Calculation (min) 41 min    Activity Tolerance Patient tolerated treatment well                  Past Medical History:  Diagnosis Date   Asthma    GERD (gastroesophageal reflux disease)    High cholesterol    under control.    History of blood transfusion    Hypothyroidism    Interstitial lung disease (HCC)    Multiple myeloma (HCC)    Perennial allergic rhinitis    Pneumonia    Sciatic pain, right    Seasonal allergic rhinitis    Thyroid disease    Past Surgical History:  Procedure Laterality Date   BRONCHIAL BIOPSY  06/18/2021   Procedure: BRONCHIAL BIOPSIES;  Surgeon: Denson Flake, MD;  Location: [facility];  Service: Cardiopulmonary;;   BRONCHIAL BIOPSY  08/28/2021   Procedure: BRONCHIAL BIOPSIES;  Surgeon: Prudy Brownie, DO;  Location: [facility];  Service: Cardiopulmonary;;   BRONCHIAL WASHINGS  06/18/2021   Procedure: BRONCHIAL WASHINGS;  Surgeon: Denson Flake, MD;  Location: [facility];  Service: Cardiopulmonary;;   BRONCHIAL WASHINGS  08/28/2021   Procedure: BRONCHIAL WASHINGS;  Surgeon: Prudy Brownie, DO;  Location: [facility];  Service: Cardiopulmonary;;   NASAL SINUS SURGERY     NECK SURGERY  1996   VIDEO BRONCHOSCOPY N/A 06/18/2021   Procedure: VIDEO BRONCHOSCOPY WITH FLUORO;  Surgeon: Denson Flake, MD;  Location: [facility];  Service: Cardiopulmonary;  Laterality: N/A;   VIDEO BRONCHOSCOPY N/A 08/28/2021   Procedure: VIDEO BRONCHOSCOPY WITH FLUORO;  Surgeon: Prudy Brownie, DO;   Location: [facility];  Service: Cardiopulmonary;  Laterality: N/A;   Patient Active Problem List   Diagnosis Date Noted   Essential hypertension 07/04/2023   Coronary artery disease 11/28/2022   Mixed hyperlipidemia 11/28/2022   Recurrent sinusitis 11/04/2022   Acute bacterial rhinosinusitis 09/26/2022   Chronic respiratory failure with hypoxia (HCC) 08/01/2022   S/P bronchoscopy    ILD (interstitial lung disease) (HCC)    Preop cardiovascular exam 08/21/2021   Pneumonitis 07/20/2021   Anxiety 07/20/2021   Interstitial pulmonary disease (HCC) 07/20/2021   Abnormal CT of the chest 06/18/2021   Dyspnea 06/15/2021   Hypoxia 06/15/2021   Multiple myeloma (HCC) 03/05/2021   Multiple myeloma not having achieved remission (HCC) 03/05/2021   Bone lesion 02/14/2021   Monoclonal gammopathy 02/14/2021   High cholesterol    Allergic rhinitis 11/23/2020   Mild persistent asthma 11/23/2020   SOB (shortness of breath) 10/04/2011    Onset date: 05/15/23 (referral date)  REFERRING DIAG: R49.0 (ICD-10-CM) - Dysphonia  THERAPY DIAG: Other voice and resonance disorders  Rationale for Evaluation and Treatment: Rehabilitation  SUBJECTIVE:   SUBJECTIVE STATEMENT: Entered w/ portable oxygen, Pt shared on 2 liters of oxygen today, noted improvements in voice/breath support. Accompanied by: self  PERTINENT HISTORY: "Samuel [name] is a 68 y.o. male who presents as a return patient, for follow-up of sinusitis  and globus sensation. At time of last visit on 03/14/2023, nasal culture was obtained following thick mucoid drainage noted in the right nasal cavity. This was abnormal, and demonstrated gram-positive bacilli. Patient was subsequently treated with a 2-week course of oral antibiotics, and presents today for posttreatment imaging. He has continued to use proton pump inhibitor twice daily, but continues to experience frequent throat clearing and globus sensation. He states he has been working on  dietary and lifestyle modifications as previously instructed, and has been partially successful. He still has difficulty with frequent throat clearing and needing to cough often. He also has not been able to obtain humidification for his oxygen, as the DME company stated they needed a prescription.  Last visit 03/14/2023: Samuel [name] is a 68 y.o. male who presents as a new consult, referred by Girard Lam Virginia , FNP , for evaluation and treatment of sinus issues, hoarseness, and throat symptoms. He is accompanied by his wife.  Patient had history of multiple myeloma, status post treatment with chemotherapy. He developed interstitial lung disease following chemotherapeutic treatment, and is currently followed closely by pulmonology. He also endorses history of significant environmental allergies, and reports that recent allergy  testing showed positivity to "everything". He was previously on immunotherapy, but this was held during his cancer treatment. He is considering starting back on the chemotherapy due to the significance of his symptoms. He is currently using Singulair , antihistamine and Patanase nasal spray on a daily basis. He also reports using nasal saline irrigations on a daily basis. He reports history of deviated nasal septum repair in 1983. He denies history of sinusitis requiring treatment with oral antibiotics in the last 12 months. His primary symptoms are nasal congestion and postnasal drainage. He denies facial pressure, pain, hyposmia, hypogeusia. He has not had any imaging of his head or sinuses. He does occasionally experience headaches. He uses oxygen on a nightly basis, and states that his oxygen is currently not humidified.  He reports symptoms of silent reflux, and states he is currently taking omeprazole  on a daily basis. Samuel [name] is frequent throat clearing and globus sensation. He also reports occasional hoarseness."  PAIN: Are you having pain? No  FALLS: Has patient fallen  in last 6 months? No  LIVING ENVIRONMENT: Lives with: lives with their family Lives in: House/apartment  PLOF:Level of assistance: Independent with ADLs, Independent with IADLs Employment: Full-time employment  PATIENT GOALS: "control my breath, control my speech, and relaxation"   OBJECTIVE:   DIAGNOSTIC FINDINGS: MBSS 11/05/23:  Pt's oropharyngeal swallow is functional with adequate efficiency and safety. He has trace amounts of residue in his pyriform sinuses after the swallow, noted more with liquids than with solids. He does not have a lot of anterior hyoid excursion but he has no other overt pharyngeal deficits. No penetration or aspiration observed. Note that pt did report feeling like the purees and solids got stuck in his throat, which elicited coughing and throat clearing in an attempt to clear. When correlated with imaging, there was no residual in his pharynx. Question a referrred sensation and primary esophageal component (pt having esophagram after this study). Would continue regular solids and thin liquids with use of esophageal precautions. Would defer f/u to primary OP SLP.  ENT 04/04/23:  "Nasolaryngoscopy performed at last visit demonstrated moderate interarytenoid edema with otherwise normal upper airway exam. Counseled patient to continue twice daily omeprazole  for next 2-3 months. We discussed dietary lifestyle modifications for treatment of reflux to include avoiding spicy and acidic foods which  exacerbate reflux, avoiding late meals and snacking, avoiding caffeine and stimulants, avoiding carbonated and alcoholic beverages, adequate oral hydration, avoidance of throat clearing and sleeping with the head of bed elevated. Recommended follow-up with gastroenterology if symptoms do not improve after 3 months of twice daily PPI. Offered referral to speech-language pathology due to continued hoarseness and frequent throat clearing, patient was agreeable, order placed today."   ENT  10/07/23 "Chronic Cough and Throat Clearing Chronic cough and throat clearing, likely multifactorial including post-nasal drip, chronic GERD, and vocal fold atrophy. Symptoms exacerbated by interstitial lung disease and nasal cannula O2 requirement. Examination revealed pooling of secretions in pyriform sinuses concerning for dysphagia. 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  Discussed voice therapy as first-line therapy. Reviewed potential benefits and risks of current medications and new treatments such as steroid nasal rinses and switching antihistamines. - Order swallow study to evaluate for dysphagia (MBS esophagram) - Refer to speech therapy for voice therapy for vocal fold atrophy glottic insufficiency - Continue Azelastine  for chronic nasal congestion - Switch from Claritin to Zyrtec  - Add budesonide or mometasone to nasal rinses - Use Vaseline to prevent nasal dryness - Continue omeprazole  and reflux raft, especially at night - Continue Delsym and Mucinex as needed for cough   Chronic Nasal Congestion Post-Nasal Drip Post-nasal drip contributing to chronic cough and throat clearing. Examination showed clear drainage in the back of the nose and nasal irritation likely due to allergies and O2 supplementation via nasal cannula. Discussed benefits of saline nasal rinses and potential for steroid nasal rinses to cover a larger surface area. Informed about slight risk of epistaxis with oxygen therapy and importance of using Vaseline to prevent nasal dryness. - Continue saline nasal rinses - Add budesonide or mometasone to nasal rinses - Switch from Claritin to Zyrtec  - Continue azelastine  2 puffs b/l nares twice daily - Use Vaseline to prevent nasal dryness   Chronic Gastroesophageal Reflux Disease (GERD) GERD contributing to chronic throat clearing and cough.  Discussed importance of taking omeprazole  and reflux raft, especially at night, and potential  benefits of using reflux raft after large meals during the day. 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 Examination revealed age-appropriate thinning of the vocal folds but vocal folds were mobile without masses or lesions.  - Refer to speech therapy for voice therapy/"    PATIENT REPORTED OUTCOME MEASURES (PROM): Communication Participation Item Bank: 9 "quite a bit" for all situations  but "very much" for getting turn in fast moving conversation  TODAY'S TREATMENT:         12/01/23: Entered with portable oxygen. O2 level at 94% after walking in, pt on 2 liters of oxygen this morning. SLP noted evident improvements in pt's voice and breath support today compared to last sessions. Pt also reports completion of circum-laryngeal exercises at home, confirms they have been helpful for reducing tension. ST reintroduced flow phonation exercises targeting breath support. Pt participated effectively given modeling, mod-A, and intermittent breaks for smooth, intentional diaphragmatic breathing. ST initiated [employer] [facility] ([facility]) targeting clear, intentional voice in functional conversation. Pt required occasional verbal cues to consistently project a clear, strong voice. ST plans to continue flow phonation and functional conversation training interventions in future sessions.  11/17/23: Enters with portable oxygen.  Doffed when O2 at comfortable level. Consuming Reflux Raft as recommended, with reported benefit. ST provided strategies to aid consistent administration of PPI before breakfast. Recent MBSS was [facility]. Reported overall mild improvement in breathing and voice since last session. Re-educated recommended voice techniques from prior sessions, including  focusing on "forward focused speech"/forward focused resonance. ST encouraged upright posture and whole body relaxation. Introduced and modeled circumlaryngeal massage x1 and diaphragmatic breathing to target relaxation of muscles surrounding the larynx and improved breath support. ST then introduced flow phonation targeting optimized airflow and reduced laryngeal tension. Pt participated effectively, experienced some difficulty relaxing, noted "holding breath" in between inhale and exhale. Pt performance improved with prompting, modeling and and mod-A. ST plans to facilitate practice with flow phonation exercises in future sessions. Increased but inconsistent clear vocal quality noted at end of session.  11/03/23: Entered with portable oxygen (pulse; 3 L). Wife, Samuel [name], present this session. Desaturated to 87% following short walk to SLP office. Steadily rose to 91% and 98% given rest break. Voice c/b hoarse, strained quality with dysfluent rate associated with impaired breath support. Did mildly improve throughout session; however, limited maintenance noted d/t impaired breath support. Continues to complain of "throat congestion" being main barrier to improved voicing. Dicussed previously recommended vocal hygiene practices, including reduction of throat clearing, increased hydration to thin mucous, and strict adherence to reflux recommendations. Inconsistent but increasing carryover of recommendations reported over last week, with benefit. 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 Scheduled for MBSS in two days per patient report of dysphagia to ENT. Will review documentation and recommendations and modify POC accordingly. May benefit from FEES d/t pt complaint of pharyngeal congestion. Recert completed d/t decline in speech and recent concern for dysphagia.   10-08-23: Overt SOB and  hoarseness exhibited this session, which was notably worse than last session. Recommended f/u with pulmonologist d/t persistent difficulty breathing impacting voice and overall functioning. Recently saw ENT and pulmonary rehab. Re-educated ENT results and recommendations, including being increasingly intentional with reducing throat clearing and taking reflux raft for reflux management. Suggested gargling with water after inhaler use d/t SLP research re: dysphonia and inhalers. Plan to initiate new interventions, circumlaryngeal message and PhoRTE, next session pending pt respiratory baseline to address newly diagnosed vocal fold atrophy and supraglottic compression.   09-24-23: Visited ED on 09/12/23 d/t shortness of breath. Began course of antibiotics for suspected bronchitis/pneumonia and symptoms have resolved. Despite recent respiratory challenges, pt reports wanting to continue IMST exercises as he has been completing daily HEP and feels it is significantly improving respiratory strength. Additionally, a friend provided positive affirmation of voice strength and quality over 2 hour conversation. In previous session, SLP set device to 26.25 cm H20 based on MIP calculation and instructed pt not to adjust it, however the device was set to 33 cm H20 when pt entered. Reassessed MIP today across 3 trials: 80, 58, 75. Adjusted IMST device to 75% of MIP (80): 60 cm H20 (SLP set to max setting: 41 cm H20). Pt demo'd 25 reps with rare min A. Reviewed reflux precautions as pt endorses increased globus sensation/congestion in throat. Pt would like f/u with ENT for second opinion; requested today. Recommended inquiring about pulmonary rehab at upcoming appointment d/t success with IMST thus far. Re-assess MIP next session.   09-08-23: Continues to c/o significant asthma impacting respiratory function and subsequently voice. Initiated education  and instruction of Inspiratory Muscle Strength Training (IMST) to address  impaired breath support impacting vocal intensity and clarity. Measured MIP across 3 trials: 35, 29, 17. Modified provided IMST device to 75% of MIP (35): 26.25 cm H2O. Instructed patient on how to use device and perform recommended exercises (see pt instructions for HEP). Pt able to demo 25 reps with occasional min A. Current device setting was deemed appropriate. Re-assess MIP next session.   08-26-23: Reports feeling under the weather during the past week and experiencing congestion from asthma today. SLP led discussion about preparing for work meetings occurring after ST. Recommended taking 5 to 10 minutes before meetings to reset breathing and prepare for prolonged speech. Pt identified x2 strategies to focus on during meeting with mod I. In 30 minute unstructured conversation, pt experienced occasional wet vocal quality, likely due to congestion, but independently self-corrected. Otherwise maintained strong breath support and clear vocal quality with rare min A and x3 cough/throat clears. Pt endorses overall improvements in conversational speech, but wants to continue targeting respiratory support.   08-12-23: Reports ongoing improvements in voice as well as breathing with less frequent cues provided by wife to use strategies. Continue to target use of diaphragmatic breathing, intentional upper body relaxation, and forward projection of voice in dual tasking scenarios. Pt aware of increased tension and reduced abdominal breathing during transition periods that include dividing attention between talking and other task (walking, answering phone). Focused on being intentional with relaxation and breath support to improve vocal quality and intensity during targeted dual tasking activities. Occasional min cues required to reduce tension, optimize breathing pattern, and project voice during these tasks.   08-06-23: Entered with hoarse vocal quality that improved independently, with pt stating that he focused on  using forward resonance. Maintained relaxed appearance, strong breath support, and clear vocal quality during 20-minute unstructured conversation. SLP directed pt to walk down the hall and return to therapy room to read a short passage x2 to target increased breath support and clear vocal quality in speech following exertion. Successfully completed task with occasional fading to rare min A, with improved breath support and vocal quality noted in the 2nd attempt. Benefited from cues to reset before beginning to read and take intentional pauses between sentences. Pt self-rated first attempt a 7.5 and second attempt an 8.5-9 out of 10. Addressed breath support during exertion by completing walking and talking task. Pt maintained structured conversation while completing laps around therapy gym with occasional mod fading to min A to take breaths in between sentences and stay relaxed. Increased success noted in additional trial.   07-29-23: Reported ongoing HEP completion. Endorsed overall improvement of SOVT exercises, although some days are better than others. Targeted SOVTE to reduce vocal tension and improve clarity. Completed sustained exhale, hum, and glides into water with occasional min A to reduce upper body tension and take reset breaths. Introduced straw talking exercise with short phrases to improve forward resonance. Used negative practice to alternate between back-focused voice and forward resonance to improve awareness of vocal quality. Accurately read aloud a list of 8-9 word sentences using forward resonance with occasional min A. Back-focused voice more apparent at the end of sentences. Benefited from cues to break up long sentences with a pause and reset with a straw or hum.  07-23-23: Entered with clear vocal quality and reported completing SOVT HEP intermittently. Targeted SOVT exercises to reduce tension in vocal folds and improve vocal quality. Aware he can sustain exhale longer than before  during  exercises (averaged 6 sec for breath only). Completed hum, accents, and happy birthday into water given occasional fading to rare min A to relax upper body tension and increase breath support. Maintained 15 minute conversation with clear vocal quality and minimal upper body tension independently. Pt endorsed increasing awareness of body tension in the community; however, his wife occasionally cues him to relax which causes frustration. SLP counseled pt that the strategies he is learning will take time to master in the community. No coughing or throat clearing observed throughout session.    PATIENT EDUCATION: Education details: see above  Person educated: Patient and Spouse Education method: Medical illustrator Education comprehension: verbalized understanding, returned demonstration, and needs further education  HOME EXERCISE PROGRAM: Abdominal breathing, forward resonance  GOALS: Goals reviewed with patient? Yes  SHORT TERM GOALS: Target date: 06/23/2023 (12/01/2023 for recert)  Pt will reduce throat clearing and coughing by 50% at STG date using trained techniques Baseline: > 9, 6 respectively; 1/0 at STG date Goal status: MET  2.  Pt will employ abdominal breathing during structured tasks with 80% accuracy given occasional min A  Baseline:  Goal status: MET  3.  Pt will employ abdominal breathing during structured conversation with 80% accuracy given occasional min A Baseline:  Goal status: MET  4.  Pt will maintain clear vocal quality in 5-10 minute structured conversations x2 given occasional min A  Baseline:  Goal status: MET  5.  Pt will carryover recommendations and complete HEP as recommended x3 sessions  Baseline:  Goal status: ONGOING (at recert)  6.  Pt will achieve clear vocal clarity with use of trained techniques in short, structured conversations 3-5 mins x2 sessions  Baseline:  12-01-23 Goal status: ONGOING (at recert)  7.  Pt will demonstrate improved  swallow function with recommended strategies and/or exercises given rare min A x2 sessions Baseline:  Goal status: ONGOING  (at recert)    LONG TERM GOALS: Target date: 07/21/2023 (12/29/2023 for recert)   Pt will reduce throat clearing and coughing by 75% at LTG date using trained techniques Baseline:  Goal status: MET  2.  Pt will employ abdominal breathing during unstructured conversation with 80% accuracy given rare min A Baseline:  Goal status: MET  3.  Pt will maintain clear vocal quality in 15+ minute unstructured conversations x2 given rare min A  Baseline: 07-23-23, 08-06-23 Goal status: MET   4.  Pt will carryover trained voice techniques during community outings x2 with less frequent cues required by wife  Baseline: 08-12-23, 09-24-23 Goal status: MET  5.  Pt will complete IMST HEP with rare min A resulting in increased MIP at 4 weeks Baseline: MIP=26.25 Goal status: MET  6.  Pt will achieve clear vocal clarity with use of trained techniques in unstructured conversations 10+ minutes x2 session  Baseline:  Goal status: ONGOING (at recert)  7.  Pt will report improved communication effectiveness via PROM by 2 pts at last ST session  Baseline: CPIB=9 Goal status: IN PROGRESS (at recert)  8.  Pt will report improved swallow function with recommended strategies and/or exercises at home with mod I Baseline:  Goal status: ONGOING (at recert)  ASSESSMENT:  CLINICAL IMPRESSION: Patient is a 68 y.o. M who was seen today for dysphonia. PMHX significant for Multiple Myeloma, interstitial lung disease, and GERD. MBSS displayed no cause for concern and sufficient swallow. Due to dx of VF atrophy and supraglottic compression, pt continues to benefit from additional education and  training of additional voice exercises to optimize vocal quality. Pt continuing to benefit from skilled ST intervention, such as training vocal hygiene protocol and voice techniques to optimize vocal  quality and communication effectiveness.   OBJECTIVE IMPAIRMENTS: include voice disorder. These impairments are limiting patient from effectively communicating at home and in community. Factors affecting potential to achieve goals and functional outcome are co-morbidities. Patient will benefit from skilled SLP services to address above impairments and improve overall function.  REHAB POTENTIAL: Good  PLAN:  SLP FREQUENCY: 1-2x/week (pt elects for every other week)  SLP DURATION: 8 weeks (+ 8 additional weeks for recert)  PLANNED INTERVENTIONS: Language facilitation, Environmental controls, Cueing hierachy, Internal/external aids, Functional tasks, Multimodal communication approach, SLP instruction and feedback, Compensatory strategies, Patient/family education, and Re-evaluation    Tamar Fairly, CCC-SLP 12/01/2023, 11:20 AM

## 2023-12-01 NOTE — Telephone Encounter (Signed)
 Cleotilde Dago; Minnie Amber; Tucker, Dolanda; Cain, Mitchell I am not seeing the walk test results (from POC)

## 2023-12-02 ENCOUNTER — Encounter (HOSPITAL_COMMUNITY)
Admission: RE | Admit: 2023-12-02 | Discharge: 2023-12-02 | Disposition: A | Discharge status: Home / Self Care | Payer: Medicare Other | Source: Ambulatory Visit | Attending: Internal Medicine | Admitting: Internal Medicine

## 2023-12-02 DIAGNOSIS — J849 Interstitial pulmonary disease, unspecified: Secondary | ICD-10-CM

## 2023-12-02 NOTE — Telephone Encounter (Signed)
Pt needs walk for POC    We did not order POC Pt uses 8 liters with exertion - not using POC

## 2023-12-02 NOTE — Telephone Encounter (Signed)
Order has been sent to [facility] they have sent it off to be processed

## 2023-12-02 NOTE — Progress Notes (Signed)
Daily Session Note  Patient Details  Name: Samuel [name] MRN: [id number] Date of Birth: Sep 11, 1956 Referring Provider:   Doristine Devoid [facility] Rehab Walk Test from 10/06/2023 in [facility]  Referring Provider Ramaswamy       Encounter Date: 12/02/2023  Check In:  Session Check In - 12/02/23 0809       Check-In   Supervising physician immediately available to respond to emergencies [facility] MD immediately available    Physician(s) Hoover Browns, NP    Location [facility]    Staff Present Raford Pitcher, MS, ACSM-CEP, Exercise Physiologist;Gayna Braddy Erin Sons BS, ACSM-CEP, Exercise Physiologist    Virtual Visit No    Medication changes reported     No    Fall or balance concerns reported    No    Tobacco Cessation No Change    Warm-up and Cool-down Performed as group-led instruction    Resistance Training Performed Yes    VAD Patient? No    PAD/SET Patient? No      Pain Assessment   Currently in Pain? No/denies    Multiple Pain Sites No             Capillary Blood Glucose: No results found for this or any previous visit (from the past 24 hours).    Social History   Tobacco Use  Smoking Status Former   Current packs/day: 0.00   Average packs/day: 0.1 packs/day for 3.0 years (0.3 ttl pk-yrs)   Types: Cigarettes   Start date: 10/22/1979   Quit date: 10/21/1982   Years since quitting: 41.1  Smokeless Tobacco Never    Goals Met:  Proper associated with RPD/PD & O2 Sat Independence with exercise equipment Exercise tolerated well No report of concerns or symptoms today Strength training completed today  Goals Unmet:  Not Applicable  Comments: Service time is from 0802 to 0915.    Dr. Mechele Collin is Medical Director for Pulmonary Rehab at [facility] [place].

## 2023-12-03 ENCOUNTER — Inpatient Hospital Stay: Payer: Medicare Other | Attending: Hematology and Oncology

## 2023-12-03 DIAGNOSIS — C9000 Multiple myeloma not having achieved remission: Secondary | ICD-10-CM | POA: Insufficient documentation

## 2023-12-03 LAB — CBC WITH DIFFERENTIAL (CANCER CENTER ONLY)
Abs Immature Granulocytes: 0.02 10*3/uL (ref 0.00–0.07)
Basophils Absolute: 0 10*3/uL (ref 0.0–0.1)
Basophils Relative: 0 %
Eosinophils Absolute: 0.2 10*3/uL (ref 0.0–0.5)
Eosinophils Relative: 3 %
HCT: 41.7 % (ref 39.0–52.0)
Hemoglobin: 14 g/dL (ref 13.0–17.0)
Immature Granulocytes: 0 %
Lymphocytes Relative: 23 %
Lymphs Abs: 1.6 10*3/uL (ref 0.7–4.0)
MCH: 30.8 pg (ref 26.0–34.0)
MCHC: 33.6 g/dL (ref 30.0–36.0)
MCV: 91.9 fL (ref 80.0–100.0)
Monocytes Absolute: 0.4 10*3/uL (ref 0.1–1.0)
Monocytes Relative: 6 %
Neutro Abs: 4.6 10*3/uL (ref 1.7–7.7)
Neutrophils Relative %: 68 %
Platelet Count: 231 10*3/uL (ref 150–400)
RBC: 4.54 MIL/uL (ref 4.22–5.81)
RDW: 12.8 % (ref 11.5–15.5)
WBC Count: 6.6 10*3/uL (ref 4.0–10.5)
nRBC: 0 % (ref 0.0–0.2)

## 2023-12-03 LAB — CMP (CANCER CENTER ONLY)
ALT: 34 U/L (ref 0–44)
AST: 31 U/L (ref 15–41)
Albumin: 4 g/dL (ref 3.5–5.0)
Alkaline Phosphatase: 57 U/L (ref 38–126)
Anion gap: 4 — ABNORMAL LOW (ref 5–15)
BUN: 13 mg/dL (ref 8–23)
CO2: 32 mmol/L (ref 22–32)
Calcium: 9.3 mg/dL (ref 8.9–10.3)
Chloride: 97 mmol/L — ABNORMAL LOW (ref 98–111)
Creatinine: 0.87 mg/dL (ref 0.61–1.24)
GFR, Estimated: >60 mL/min (ref >60)
Glucose, Bld: 105 mg/dL — ABNORMAL HIGH (ref 70–99)
Potassium: 3.9 mmol/L (ref 3.5–5.1)
Sodium: 133 mmol/L — ABNORMAL LOW (ref 135–145)
Total Bilirubin: 0.5 mg/dL (ref 0.0–1.2)
Total Protein: 7.9 g/dL (ref 6.5–8.1)

## 2023-12-03 LAB — LACTATE DEHYDROGENASE: LDH: 279 U/L — ABNORMAL HIGH (ref 98–192)

## 2023-12-04 ENCOUNTER — Encounter (HOSPITAL_COMMUNITY)
Admission: RE | Admit: 2023-12-04 | Discharge: 2023-12-04 | Disposition: A | Discharge status: Home / Self Care | Payer: Medicare Other | Source: Ambulatory Visit | Attending: Internal Medicine

## 2023-12-04 DIAGNOSIS — J849 Interstitial pulmonary disease, unspecified: Secondary | ICD-10-CM | POA: Diagnosis not present

## 2023-12-04 LAB — KAPPA/LAMBDA LIGHT CHAINS
Kappa free light chain: 37.4 mg/L — ABNORMAL HIGH (ref 3.3–19.4)
Kappa, lambda light chain ratio: 2.4 — ABNORMAL HIGH (ref 0.26–1.65)
Lambda free light chains: 15.6 mg/L (ref 5.7–26.3)

## 2023-12-04 NOTE — Progress Notes (Signed)
Daily Session Note  Patient Details  Name: Samuel [name] MRN: [id number] Date of Birth: August 14, 1956 Referring Provider:   Doristine Devoid [facility]monary Rehab Walk Test from 10/06/2023 in [facility]  Referring Provider Ramaswamy       Encounter Date: 12/04/2023  Check In:  Session Check In - 12/04/23 0831       Check-In   Supervising physician immediately available to respond to emergencies [facility] MD immediately available    Physician(s) Rise Paganini, NP    Location [facility]    Staff Present Raford Pitcher, MS, ACSM-CEP, Exercise Physiologist;Areta Terwilliger Synthia Innocent, RN, BSN    Virtual Visit No    Medication changes reported     No    Fall or balance concerns reported    No    Tobacco Cessation No Change    Warm-up and Cool-down Performed as group-led instruction    Resistance Training Performed Yes    VAD Patient? No    PAD/SET Patient? No      Pain Assessment   Currently in Pain? No/denies    Multiple Pain Sites No             Capillary Blood Glucose: No results found for this or any previous visit (from the past 24 hours).    Social History   Tobacco Use  Smoking Status Former   Current packs/day: 0.00   Average packs/day: 0.1 packs/day for 3.0 years (0.3 ttl pk-yrs)   Types: Cigarettes   Start date: 10/22/1979   Quit date: 10/21/1982   Years since quitting: 41.1  Smokeless Tobacco Never    Goals Met:  Proper associated with RPD/PD & O2 Sat Independence with exercise equipment Exercise tolerated well No report of concerns or symptoms today Strength training completed today  Goals Unmet:  Not Applicable  Comments: Service time is from 0813 to 0929.    Dr. Mechele Collin is Medical Director for Pulmonary Rehab at [facility].

## 2023-12-05 NOTE — Telephone Encounter (Signed)
Patient has portable oxygen in tanks as he does not tolerate pulsed oxygen.  He is paying out of pocket for rental of portable tanks.  Patient has been in office and has the oxygen.  Nothing further needed.

## 2023-12-07 LAB — MULTIPLE MYELOMA PANEL, SERUM
Albumin SerPl Elph-Mcnc: 3.6 g/dL (ref 2.9–4.4)
Albumin/Glob SerPl: 1 (ref 0.7–1.7)
Alpha 1: 0.2 g/dL (ref 0.0–0.4)
Alpha2 Glob SerPl Elph-Mcnc: 0.8 g/dL (ref 0.4–1.0)
B-Globulin SerPl Elph-Mcnc: 1 g/dL (ref 0.7–1.3)
Gamma Glob SerPl Elph-Mcnc: 1.6 g/dL (ref 0.4–1.8)
Globulin, Total: 3.7 g/dL (ref 2.2–3.9)
IgA: 300 mg/dL (ref 61–437)
IgG (Immunoglobin G), Serum: 1713 mg/dL — ABNORMAL HIGH (ref 603–1613)
IgM (Immunoglobulin M), Srm: 115 mg/dL (ref 20–172)
M Protein SerPl Elph-Mcnc: 0.6 g/dL — ABNORMAL HIGH
Total Protein ELP: 7.3 g/dL (ref 6.0–8.5)

## 2023-12-08 ENCOUNTER — Ambulatory Visit (INDEPENDENT_AMBULATORY_CARE_PROVIDER_SITE_OTHER): Payer: Medicare Other | Admitting: Otolaryngology

## 2023-12-08 ENCOUNTER — Encounter (INDEPENDENT_AMBULATORY_CARE_PROVIDER_SITE_OTHER): Payer: Self-pay | Admitting: Otolaryngology

## 2023-12-08 VITALS — BP 144/76 | HR 65

## 2023-12-08 DIAGNOSIS — R1310 Dysphagia, unspecified: Secondary | ICD-10-CM

## 2023-12-08 DIAGNOSIS — J3089 Other allergic rhinitis: Secondary | ICD-10-CM

## 2023-12-08 DIAGNOSIS — R0981 Nasal congestion: Secondary | ICD-10-CM

## 2023-12-08 DIAGNOSIS — Z9981 Dependence on supplemental oxygen: Secondary | ICD-10-CM

## 2023-12-08 DIAGNOSIS — J383 Other diseases of vocal cords: Secondary | ICD-10-CM

## 2023-12-08 DIAGNOSIS — R053 Chronic cough: Secondary | ICD-10-CM | POA: Diagnosis not present

## 2023-12-08 DIAGNOSIS — R490 Dysphonia: Secondary | ICD-10-CM | POA: Diagnosis not present

## 2023-12-08 DIAGNOSIS — R0982 Postnasal drip: Secondary | ICD-10-CM | POA: Diagnosis not present

## 2023-12-08 DIAGNOSIS — Z8709 Personal history of other diseases of the respiratory system: Secondary | ICD-10-CM

## 2023-12-08 DIAGNOSIS — K219 Gastro-esophageal reflux disease without esophagitis: Secondary | ICD-10-CM

## 2023-12-08 DIAGNOSIS — R0989 Other specified symptoms and signs involving the circulatory and respiratory systems: Secondary | ICD-10-CM

## 2023-12-08 NOTE — Progress Notes (Signed)
ENT Progress Note:   Update 12/08/2023  Discussed the use of AI scribe software for clinical note transcription with the patient, who gave verbal consent to proceed.  History of Present Illness   Samuel [name] "Samuel [name]" is a 68 year old male with interstitial lung disease who presents with persistent throat congestion and phlegm sensation.  He experiences persistent throat congestion and a sensation of phlegm that he is unable to clear.  He continues to experience this sensation despite using a reflux raft and taking omeprazole, which he feels has helped somewhat. Found speech therapy helpful and voice is overall better.   He has a history of bronchitis in November, 2024. His cough has decreased, but he still feels that air does not flow freely, describing a sensation of not being able to take breaths deep enough. He uses nasal sprays and antihistamines, including Flonase and Claritin or Zyrtec, and performs nasal rinses, which have reduced postnasal drainage but not completely resolved it. He has interstitial lung disease, which has worsened his symptoms, on O2 supplementation 24/7 with a CT scan scheduled for March 2025.  His voice is stronger after voice therapy but varies, and he speaks with effort. He correlates this with his lung function.  Records Reviewed:  Speech Therapy Notes 11/03/23 11/03/23: Entered with portable oxygen (pulse; 3 L). Wife, Zella Ball, present this session. Desaturated to 87% following short walk to SLP office. Steadily rose to 91% and 98% given rest break. Voice c/b hoarse, strained quality with dysfluent rate associated with impaired breath support. Did mildly improve throughout session; however, limited maintenance noted d/t impaired breath support. Continues to complain of "throat congestion" being main barrier to improved voicing. Dicussed previously recommended vocal hygiene practices, including reduction of throat clearing, increased hydration to thin mucous, and strict  adherence to reflux recommendations. Inconsistent but increasing carryover of recommendations reported over last week, with benefit. 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 Scheduled for MBSS in two days per patient report of dysphagia to ENT. Will review documentation and recommendations and modify POC accordingly. May benefit from FEES d/t pt complaint of pharyngeal congestion. Recert completed d/t decline in speech and recent concern for dysphagia.    10-08-23: Overt SOB and hoarseness exhibited this session, which was notably worse than last session. Recommended f/u with pulmonologist d/t persistent difficulty breathing impacting voice and overall functioning. Recently saw ENT and pulmonary rehab. Re-educated ENT results and recommendations, including being increasingly intentional with reducing throat clearing and taking reflux raft for reflux management. Suggested gargling with water after inhaler use d/t SLP research re: dysphonia and inhalers. Plan to initiate new interventions, circumlaryngeal message and PhoRTE, next session pending pt respiratory baseline to address newly diagnosed vocal fold atrophy and supraglottic compression.    Initial Evaluation  Reason for Consult: dysphonia chronic cough and throat clearing    HPI: Discussed the use of AI scribe software for clinical note transcription with the patient, who gave verbal consent to proceed.  History of Present Illness   The patient is 68 yoM  with hx of interstitial lung disease, presents with chronic cough and sensation of mucus in the throat, which have been ongoing for several years and have worsened with the progression of their lung condition. The patient reports a history of multiple myeloma treatment, which led to the diagnosis of interstitial lung disease in September 2022 which was thought to be  caused  by medication side effect. The patient denies any history of stroke or heart attack.  The patient uses oxygen intermittently, during exercise, at night during sleep, and occasionally while watching TV. They recently experienced an exacerbation of their lung disease, which was managed with steroids. The patient also reports a history of environmental allergies and was on allergy shots for approximately ten years, which they felt were beneficial. However, they discontinued the shots due to their overall health condition.  The patient has been managing their nasal drainage with saline rinses daily, azelastine, and Flonase, usually once in the morning. They also take Claritin almost daily. For reflux, they take omeprazole. They take mucinex as needed, and recently started Reflux Raft about a month ago. The patient also occasionally uses Delsym for cough.  The patient reports occasional difficulty swallowing, but denies any specific food causing more problems. They also report occasional coughing or choking with liquids. The patient has noticed voice changes, which seem to worsen towards the end of the day. They have been seeing a speech therapist for these issues and have one session remaining.   The patient has been experiencing these symptoms for several years, but they have been exacerbated by their lung condition.   Records Reviewed:  Video Visit with Pulm 09/26/23  68 year old male former smoker followed for interstitial lung disease, drug-induced pneumonitis (multiple myeloma drug treatment) and chronic respiratory failure and asthma Medical history significant for multiple myeloma diagnosed in 2022 followed by oncology   Today's video visit is a 2 week  follow-up for interstitial lung disease, drug-induced pneumonitis, chronic respiratory failure and asthma.  Patient was seen last visit 2 weeks ago with acute symptoms of cough, congestion and shortness of breath.  Was treated for possible asthmatic  bronchitic exacerbation with a Z-Pak and a prednisone taper.  Lab work showed normal BNP.   Flu and COVID was negative.  White blood cell count was normal D-dimer was minimally elevated.  CT chest was negative for PE.  Showed chronic interstitial lung disease with mildly increased groundglass opacities, unchanged lesions in the left sixth rib and the left inferior scapula.  Since last visit patient is feeling much improved cough and congestion have decreased.  Shortness of breath has improved.  Patient is trying to get back to being more active is currently walking on the treadmill.  Has also been doing some breathing exercises.  Has been working with speech therapy.  We discussed his test results in detail.  Baseline get winded with activity.  We discussed pulmonary rehab, very interested in this.  Has a few days of prednisone left. Remains on Breztri Twice daily. Remains on O2 2l/m with activity and At bedtime    ILD /Hx of Drug induced Pneumonitis -recent flare-finish steroids. Refer to pulmonary rehab. Change HRCT for 3 months.    Asthma-recent flare now improving -finish steroids    O2 RF -continue on O2 2l/m with activity and At bedtime  SLP notes (multiple)  Reports overall improvement of voice quality and reduction of chronic cough/throat clearing behaviors. Endorses positive changes since beginning IMST exercises.    Objective Measurements: Pt exhibits occasional throat clearing, but successfully demonstrates cough suppression techniques and abdominal breathing pattern. Achieves clear and strong voicing in unstructured conversation, however is limited d/t poor respiratory support. He has increased MIP significantly over 2 weeks of completing IMST HEP.  Shafer Swamy is a 68 y.o. male who presents as a return patient, for follow-up of sinusitis and globus sensation.  At time of last visit on 03/14/2023, nasal culture was obtained following thick mucoid drainage noted in the right nasal  cavity. This was abnormal, and demonstrated gram-positive bacilli. Patient was subsequently treated with a 2-week course of oral antibiotics, and presents today for posttreatment imaging. He has continued to use proton pump inhibitor twice daily, but continues to experience frequent throat clearing and globus sensation. He states he has been working on dietary and lifestyle modifications as previously instructed, and has been partially successful. He still has difficulty with frequent throat clearing and needing to cough often. He also has not been able to obtain humidification for his oxygen, as the DME company stated they needed a prescription.    Last visit 03/14/2023: Ean Gettel is a 68 y.o. male who presents as a new consult, referred by [name], FNP , for evaluation and treatment of sinus issues, hoarseness, and throat symptoms. He is accompanied by his wife.  Patient had history of multiple myeloma, status post treatment with chemotherapy. He developed interstitial lung disease following chemotherapeutic treatment, and is currently followed closely by pulmonology. He also endorses history of significant environmental allergies, and reports that recent allergy testing showed positivity to "everything". He was previously on immunotherapy, but this was held during his cancer treatment. He is considering starting back on the chemotherapy due to the significance of his symptoms. He is currently using Singulair, antihistamine and Patanase nasal spray on a daily basis. He also reports using nasal saline irrigations on a daily basis. He reports history of deviated nasal septum repair in 1983. He denies history of sinusitis requiring treatment with oral antibiotics in the last 12 months. His primary symptoms are nasal congestion and postnasal drainage. He denies facial pressure, pain, hyposmia, hypogeusia. He has not had any imaging of his head or sinuses. He does occasionally experience  headaches. He uses oxygen on a nightly basis, and states that his oxygen is currently not humidified.  He reports symptoms of silent reflux, and states he is currently taking omeprazole on a daily basis. [name] is frequent throat clearing and globus sensation. He also reports occasional hoarseness."   A/P 09-24-23: Visited ED on 09/12/23 d/t shortness of breath. Began course of antibiotics for suspected bronchitis/pneumonia and symptoms have resolved. Despite recent respiratory challenges, pt reports wanting to continue IMST exercises as he has been completing daily HEP and feels it is significantly improving respiratory strength. Additionally, a friend provided positive affirmation of voice strength and quality over 2 hour conversation. In previous session, SLP set device to 26.25 cm H20 based on MIP calculation and instructed pt not to adjust it, however the device was set to 33 cm H20 when pt entered. Reassessed MIP today across 3 trials: 80, 58, 75. Adjusted IMST device to 75% of MIP (80): 60 cm H20 (SLP set to max setting: 41 cm H20). Pt demo'd 25 reps with rare min A. Reviewed reflux precautions as pt endorses increased globus sensation/congestion in throat. Pt would like f/u with ENT for second opinion; requested today. Recommended inquiring about pulmonary rehab at upcoming appointment d/t success with IMST thus far. Re-assess MIP next session.    09-08-23: Continues to c/o significant asthma impacting respiratory function and subsequently voice. Initiated education and instruction of Administrator, arts (IMST) to address impaired breath support impacting vocal intensity and clarity. Measured MIP across 3 trials: 35, 29, 17. Modified provided IMST device to 75% of MIP (35): 26.25 cm H2O. Instructed patient on how to use device  and perform recommended exercises (see pt instructions for HEP). Pt able to demo 25 reps with occasional min A. Current device setting was deemed appropriate.  Re-assess MIP next session.    08-26-23: Reports feeling under the weather during the past week and experiencing congestion from asthma today. SLP led discussion about preparing for work meetings occurring after ST. Recommended taking 5 to 10 minutes before meetings to reset breathing and prepare for prolonged speech. Pt identified x2 strategies to focus on during meeting with mod I. In 30 minute unstructured conversation, pt experienced occasional wet vocal quality, likely due to congestion, but independently self-corrected. Otherwise maintained strong breath support and clear vocal quality with rare min A and x3 cough/throat clears. Pt endorses overall improvements in conversational speech, but wants to continue targeting respiratory support.    08-12-23: Reports ongoing improvements in voice as well as breathing with less frequent cues provided by wife to use strategies. Continue to target use of diaphragmatic breathing, intentional upper body relaxation, and forward projection of voice in dual tasking scenarios. Pt aware of increased tension and reduced abdominal breathing during transition periods that include dividing attention between talking and other task (walking, answering phone). Focused on being intentional with relaxation and breath support to improve vocal quality and intensity during targeted dual tasking activities. Occasional min cues required to reduce tension, optimize breathing pattern, and project voice during these tasks.    08-06-23: Entered with hoarse vocal quality that improved independently, with pt stating that he focused on using forward resonance. Maintained relaxed appearance, strong breath support, and clear vocal quality during 20-minute unstructured conversation. SLP directed pt to walk down the hall and return to therapy room to read a short passage x2 to target increased breath support and clear vocal quality in speech following exertion. Successfully completed task with  occasional fading to rare min A, with improved breath support and vocal quality noted in the 2nd attempt. Benefited from cues to reset before beginning to read and take intentional pauses between sentences. Pt self-rated first attempt a 7.5 and second attempt an 8.5-9 out of 10. Addressed breath support during exertion by completing walking and talking task. Pt maintained structured conversation while completing laps around therapy gym with occasional mod fading to min A to take breaths in between sentences and stay relaxed. Increased success noted in additional trial.    07-29-23: Reported ongoing HEP completion. Endorsed overall improvement of SOVT exercises, although some days are better than others. Targeted SOVTE to reduce vocal tension and improve clarity. Completed sustained exhale, hum, and glides into water with occasional min A to reduce upper body tension and take reset breaths. Introduced straw talking exercise with short phrases to improve forward resonance. Used negative practice to alternate between back-focused voice and forward resonance to improve awareness of vocal quality. Accurately read aloud a list of 8-9 word sentences using forward resonance with occasional min A. Back-focused voice more apparent at the end of sentences. Benefited from cues to break up long sentences with a pause and reset with a straw or hum.   07-23-23: Entered with clear vocal quality and reported completing SOVT HEP intermittently. Targeted SOVT exercises to reduce tension in vocal folds and improve vocal quality. Aware he can sustain exhale longer than before during exercises (averaged 6 sec for breath only). Completed hum, accents, and happy birthday into water given occasional fading to rare min A to relax upper body tension and increase breath support. Maintained 15 minute conversation with clear vocal quality  and minimal upper body tension independently. Pt endorsed increasing awareness of body tension in the  community; however, his wife occasionally cues him to relax which causes frustration. SLP counseled pt that the strategies he is learning will take time to master in the community. No coughing or throat clearing observed throughout session.  Past Medical History:  Diagnosis Date   Asthma    GERD (gastroesophageal reflux disease)    High cholesterol    under control.    History of blood transfusion    Hypothyroidism    Interstitial lung disease (HCC)    Multiple myeloma (HCC)    Perennial allergic rhinitis    Pneumonia    Sciatic pain, right    Seasonal allergic rhinitis    Thyroid disease     Past Surgical History:  Procedure Laterality Date   BRONCHIAL BIOPSY  06/18/2021   Procedure: BRONCHIAL BIOPSIES;  Surgeon: Leslye Peer, MD;  Location: [facility];  Service: Cardiopulmonary;;   BRONCHIAL BIOPSY  08/28/2021   Procedure: BRONCHIAL BIOPSIES;  Surgeon: Josephine Igo, DO;  Location: [facility];  Service: Cardiopulmonary;;   BRONCHIAL WASHINGS  06/18/2021   Procedure: BRONCHIAL WASHINGS;  Surgeon: Leslye Peer, MD;  Location: [facility];  Service: Cardiopulmonary;;   BRONCHIAL WASHINGS  08/28/2021   Procedure: BRONCHIAL WASHINGS;  Surgeon: Josephine Igo, DO;  Location: [facility];  Service: Cardiopulmonary;;   NASAL SINUS SURGERY     NECK SURGERY  1996   VIDEO BRONCHOSCOPY N/A 06/18/2021   Procedure: VIDEO BRONCHOSCOPY WITH FLUORO;  Surgeon: Leslye Peer, MD;  Location: [facility];  Service: Cardiopulmonary;  Laterality: N/A;   VIDEO BRONCHOSCOPY N/A 08/28/2021   Procedure: VIDEO BRONCHOSCOPY WITH FLUORO;  Surgeon: Josephine Igo, DO;  Location: [facility];  Service: Cardiopulmonary;  Laterality: N/A;    Family History  Problem Relation Age of Onset   Hypertension Mother    Allergies Mother    Allergies Father    CAD Father 49   Breast cancer Paternal Grandmother    Hypertension Other    Heart disease Other     Social History:  reports that he  quit smoking about 41 years ago. His smoking use included cigarettes. He started smoking about 44 years ago. He has a 0.3 pack-year smoking history. He has never used smokeless tobacco. He reports that he does not currently use alcohol. He reports that he does not use drugs.  Allergies:  Allergies  Allergen Reactions   Clarithromycin Other (See Comments)    Hiccups   Ofev [Nintedanib] Other (See Comments)    GI bleeding   Penicillins Other (See Comments)    Child hood unsure    Medications: I have reviewed the patient's current medications.  The PMH, PSH, Medications, Allergies, and SH were reviewed and updated.  ROS: Constitutional: Negative for fever, weight loss and weight gain. Cardiovascular: Negative for chest pain and dyspnea on exertion. Respiratory: Is not experiencing shortness of breath at rest. Gastrointestinal: Negative for nausea and vomiting. Neurological: Negative for headaches. Psychiatric: The patient is not nervous/anxious  Blood pressure (!) 144/76, pulse 65, SpO2 92%.  PHYSICAL EXAM:  Exam: General: Well-developed, well-nourished on O2 supplementation, pauses during long sentences  Communication and Voice:slightly raspy Respiratory Respiratory effort: Equal inspiration and expiration without stridor Cardiovascular Peripheral Vascular: Warm extremities with equal color/perfusion Eyes: No nystagmus with equal extraocular motion bilaterally Neuro/Psych/Balance: Patient oriented to person, place, and time; Appropriate mood and affect; Gait is intact with no imbalance; Cranial nerves I-XII  are intact Head and Face Inspection: Normocephalic and atraumatic without mass or lesion Facial Strength: Facial motility symmetric and full bilaterally ENT Pinna: External ear intact and fully developed External canal: Canal is patent with intact skin Tympanic Membrane: Clear and mobile External Nose: No scar or anatomic deformity Internal Nose: Septum is relatively  straight. No polyp, or purulence. Mucosal edema and erythema present.  Bilateral inferior turbinate hypertrophy.  Lips, Teeth, and gums: Mucosa and teeth intact and viable TMJ: No pain to palpation with full mobility Oral cavity/oropharynx: No erythema or exudate, no lesions present Nasopharynx: No mass or lesion with intact mucosa Hypopharynx: Intact mucosa without pooling of secretions Larynx Glottic: Full true vocal cord mobility without lesion or mass, VF atrophy b/l and supraglottic compression Supraglottic: Normal appearing epiglottis and AE folds Interarytenoid Space: Moderate pachydermia&edema Subglottic Space: Patent without lesion or edema Neck Neck and Trachea: Midline trachea without mass or lesion Thyroid: No mass or nodularity Lymphatics: No lymphadenopathy  Procedure: Summary of Video-Laryngeal-Stroboscopy: b/l VF atrophy glottic insufficiency, supraglottic compression, no lesions, mucosal wave is symmetric and intact, but limited evaluation 2/2 supraglottic compression, pooling of secretions/saliva in both pyriform sinuses, worse on the left, subglottis is clear  Preoperative diagnosis: hoarseness dysphagia and chronic cough   Postoperative diagnosis:   same + GERD LPR and post-nasal drainage  Procedure: Flexible fiberoptic laryngoscopy with stroboscopy ([id number])  Surgeon: Ashok Croon, MD  Anesthesia: Topical lidocaine and Afrin  Complications: None  Condition is stable throughout exam  Indications and consent:   The patient presents to the clinic with hoarseness. All the risks, benefits, and potential complications were reviewed with the patient preoperatively and informed verbal consent was obtained.  Procedure: The patient was seated upright in the exam chair.   Topical lidocaine and Afrin were applied to the nasal cavity. After adequate anesthesia had occurred, the flexible telescope was passed into the nasal cavity. The nasopharynx was patent without mass or  lesion. The scope was passed behind the soft palate and directed toward the base of tongue. The base of tongue was visualized and was symmetric with no apparent masses or abnormal appearing tissue. There were no signs of a mass or pooling of secretions in the piriform sinuses. The supraglottic structures were normal.  The true vocal cords are mobile. The medial edges were bowed. Closure was incomplete with supraglottic compression. Periodicity present. The mucosal wave and amplitude were intact. There is moderate interarytenoid pachydermia and post cricoid edema. The mucosa appears without lesions.   The laryngoscope was then slowly withdrawn and the patient tolerated the procedure well. There were no complications or blood loss.   Studies Reviewed: CT chest 09/12/23 IMPRESSION: 1. No pulmonary embolus. 2. Lower lung volumes from prior CT. Chronic interstitial lung disease. Mild diffuse ground-glass opacity, particularly in the lung bases, increased from prior. This may represent superimposed infection or edema. 3. Small presumed myelomatous lesions in the posterior left sixth rib and left inferior scapula. These are unchanged from prior. 4. Coronary artery calcifications.  MBS/esophagram 11/05/23 CLINICAL DATA:  Patient with a history of multiple myeloma with interstitial lung disease presents today with coughing during meals, raspy voice and globus sensation  Esophagram: IMPRESSION: Mild dysmotility with intermittent tertiary contractions. Otherwise normal study.  MBS notes  Pt is a 68 yo male referred for OP MBS by ENT. 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 There was also pooling of secretions in his pyriform sinuses (L>R). He has been working  with OP SLP for dysphonia, frequent throat clearing. He feels like he is getting improvement using his IMST specifically. PMH includes: ILD (intermittent use of oxygen, has been  using more consistently lately), globus sensation, chronic cough, GERD, multiple myeloma, former smoker, neck surgery, allergies     Pt's oropharyngeal swallow is functional with adequate efficiency and safety. He has trace amounts of residue in his pyriform sinuses after the swallow, noted more with liquids than with solids. He does not have a lot of anterior hyoid excursion but he has no other overt pharyngeal deficits. No penetration or aspiration observed. Note that pt did report feeling like the purees and solids got stuck in his throat, which elicited coughing and throat clearing in an attempt to clear. When correlated with imaging, there was no residual in his pharynx. Question a referrred sensation and primary esophageal component (pt having esophagram after this study). Would continue regular solids and thin liquids with use of esophageal precautions. Would defer f/u to primary OP SLP.   Assessment/Plan: Encounter Diagnoses  Name Primary?   Age-related vocal fold atrophy    Dysphagia, unspecified type    Chronic nasal congestion    Environmental and seasonal allergies    Post-nasal drip    Chronic GERD    Chronic cough Yes   Chronic throat clearing    History of chronic lung disease    Oxygen dependent    Glottic insufficiency      Assessment and Plan    Chronic Cough and Throat Clearing Chronic cough and throat clearing, likely multifactorial including post-nasal drip, chronic GERD, and vocal fold atrophy. Symptoms exacerbated by interstitial lung disease and nasal cannula O2 requirement. Examination revealed pooling of secretions in pyriform sinuses concerning for dysphagia. 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  Discussed voice therapy as first-line therapy. Reviewed potential benefits and risks of current medications and new treatments such as steroid nasal rinses and switching antihistamines. - Order swallow study to evaluate for dysphagia  (MBS esophagram) - Refer to speech therapy for voice therapy for vocal fold atrophy glottic insufficiency - Continue Azelastine for chronic nasal congestion - Switch from Claritin to Zyrtec - Add budesonide or mometasone to nasal rinses - Use Vaseline to prevent nasal dryness - Continue omeprazole and reflux raft, especially at night - Continue Delsym and Mucinex as needed for cough  Chronic Nasal Congestion Post-Nasal Drip Post-nasal drip contributing to chronic cough and throat clearing. Examination showed clear drainage in the back of the nose and nasal irritation likely due to allergies and O2 supplementation via nasal cannula. Discussed benefits of saline nasal rinses and potential for steroid nasal rinses to cover a larger surface area. Informed about slight risk of epistaxis with oxygen therapy and importance of using Vaseline to prevent nasal dryness. - Continue saline nasal rinses - Add budesonide or mometasone to nasal rinses - Switch from Claritin to Zyrtec - Continue azelastine 2 puffs b/l nares twice daily - Use Vaseline to prevent nasal dryness  Chronic Gastroesophageal Reflux Disease (GERD) GERD contributing to chronic throat clearing and cough.  Discussed importance of taking omeprazole and reflux raft, especially at night, and potential benefits of using reflux raft after large meals during the day. - Continue omeprazole 40 mg daily - Continue reflux raft, especially at night - Consider using reflux raft after large meals during the day  Chronic dysphonia and Vocal Fold Atrophy/glottic insufficiency on strobe exam Vocal fold atrophy likely contributing to voice changes. Examination revealed age-appropriate thinning  of the vocal folds but vocal folds were mobile without masses or lesions.  - Refer to speech therapy for voice therapy   Follow-up - Schedule follow-up after swallow study and voice therapy - Schedule MBS esophagram  Update 12/08/2023 Assessment and Plan     Chronic Throat clearing  and Phlegm Sensation/Globus, chronic cough  Persistent sensation of phlegm in the throat, difficulty expelling secretions, and intermittent voice changes. Some improvement with speech therapy and reflux management. Examination shows clear secretions in both pyriforms, VF atrophy and supraglottic compression, and mild findings c/w GERD LPR. Differential includes postnasal drainage and reflux changes triggering issues with phlegm and weaker cough strength 2/2 hx of poor lung function. Discussed FEES exam to evaluate swallowing function and secretions (his MBS showed normal oropharyngeal swallow and esophagram showed mild dysmotility but no other findings). Explained that FEES involves feeding colored substances while a scope is in the nose to observe if secretions get caught anywhere. FEES could help determine if the sensation of things getting caught is real or perceived. Treatments for weak cough due to lung disease include injection augmentation, but these may not be ideal due to his poor lung function. Emphasized management of postnasal drainage and reflux. - Refer for FEES exam - Continue reflux management with Reflux Raft after each meal and Prilosec - Continue Flonase and antihistamine (Claritin or Zyrtec) - Continue nasal saline rinses (ok to hold off on adding steroids and do rinses with salt alone) - Follow up in April after lung CT/Pulm visit and FEES results  Dysphagia, coughing with liquids  Mild esophageal dysmotility noted on esophagram, with normal swallow function on MBS. Symptoms may contribute to sensation of food or secretions getting caught in the throat. - Monitor symptoms - Continue current management strategies for GEDR - FEES  Interstitial Lung Disease Ongoing management by pulmonologist. Recent exacerbations and history of bronchitis. Lung function impacts ability to cough/expel secretions effectively. Awaiting results of lung CT scan in March. Weak  cough due to lung disease affects ability to expel secretions. - Review lung CT results in April follow-up - Coordinate care with pulmonologist Dr. Monica Becton  General Health Maintenance Emphasized importance of continuing current medications and therapies to manage symptoms. - Continue Reflux Raft after each meal - Continue Flonase and antihistamine (Claritin or Zyrtec) - Continue nasal saline rinses  Dysphonia, VF atrophy/glottic insufficiency on videostrobe today  - continue speech therapy  - will consider VF injection augmentation if warranted in the future   Follow-up - Schedule follow-up appointment in April     I spent 30 minutes in total face-to-face time and in reviewing records during pre-charting, more than 50% of which was spent in counseling and coordination of care, reviewing test results, reviewing medications and treatment regimen and/or in discussing or reviewing the diagnosis, the prognosis and treatment options. Pertinent laboratory and imaging test results that were available during this visit with the patient were reviewed by me and considered in my medical decision making (see chart for details).     Ashok Croon, MD Otolaryngology [facility] Phone: [phone] Fax: [phone]    12/08/2023, 11:17 AM

## 2023-12-09 ENCOUNTER — Encounter (HOSPITAL_COMMUNITY)
Admission: RE | Admit: 2023-12-09 | Discharge: 2023-12-09 | Disposition: A | Discharge status: Home / Self Care | Payer: Medicare Other | Source: Ambulatory Visit | Attending: Internal Medicine

## 2023-12-09 VITALS — Wt 211.0 lb

## 2023-12-09 DIAGNOSIS — J849 Interstitial pulmonary disease, unspecified: Secondary | ICD-10-CM | POA: Diagnosis not present

## 2023-12-09 NOTE — Progress Notes (Signed)
Daily Session Note  Patient Details  Name: Samuel [name] MRN: [id number] Date of Birth: 01-19-1956 Referring Provider:   Doristine Devoid Pulmonary Rehab Walk Test from 10/06/2023 in [facility]  Referring Provider Ramaswamy       Encounter Date: 12/09/2023  Check In:  Session Check In - 12/09/23 1333       Check-In   Supervising physician immediately available to respond to emergencies [facility] MD immediately available    Physician(s) Carlyon Shadow, NP    Location [facility]    Staff Present Raford Pitcher, MS, ACSM-CEP, Exercise Physiologist;Ryelee Albee Synthia Innocent, RN, BSN;Randi Reeve BS, ACSM-CEP, Exercise Physiologist;Johnny Hale Bogus, MS, Exercise Physiologist    Virtual Visit No    Medication changes reported     No    Fall or balance concerns reported    No    Tobacco Cessation No Change    Warm-up and Cool-down Performed as group-led instruction    Resistance Training Performed Yes    VAD Patient? No    PAD/SET Patient? No      Pain Assessment   Currently in Pain? No/denies    Multiple Pain Sites No             Capillary Blood Glucose: No results found for this or any previous visit (from the past 24 hours).   Exercise Prescription Changes - 12/09/23 1400       Response to Exercise   Blood Pressure (Admit) 136/76    Blood Pressure (Exercise) 156/70    Blood Pressure (Exit) 130/70    Heart Rate (Admit) 82 bpm    Heart Rate (Exercise) 85 bpm    Heart Rate (Exit) 92 bpm    Oxygen Saturation (Admit) 97 %    Oxygen Saturation (Exercise) 93 %    Oxygen Saturation (Exit) 97 %    Rating of Perceived Exertion (Exercise) 13    Perceived Dyspnea (Exercise) 3    Duration Continue with 30 min of aerobic exercise without signs/symptoms of physical distress.    Intensity THRR unchanged      Progression   Progression Continue to progress workloads to maintain intensity without  signs/symptoms of physical distress.      Resistance Training   Training Prescription Yes    Weight blue bands    Reps 10-15    Time 10 Minutes      Oxygen   Oxygen Continuous    Liters 3-10      Treadmill   MPH 1.5    Grade 0    Minutes 15    METs 1.9      Recumbant Elliptical   Level 3    Minutes 15    METs 2.1      Oxygen   Maintain Oxygen Saturation 88% or higher             Social History   Tobacco Use  Smoking Status Former   Current packs/day: 0.00   Average packs/day: 0.1 packs/day for 3.0 years (0.3 ttl pk-yrs)   Types: Cigarettes   Start date: 10/22/1979   Quit date: 10/21/1982   Years since quitting: 41.1  Smokeless Tobacco Never    Goals Met:  Proper associated with RPD/PD & O2 Sat Independence with exercise equipment Exercise tolerated well No report of concerns or symptoms today Strength training completed today  Goals Unmet:  Not Applicable  Comments: Service time is from 1309 to 1442.  Dr. Mechele Collin is Medical Director for Pulmonary Rehab at [facility].

## 2023-12-10 ENCOUNTER — Ambulatory Visit: Payer: Medicare Other | Admitting: Internal Medicine

## 2023-12-10 NOTE — Progress Notes (Signed)
Pulmonary Individual Treatment Plan  Patient Details  Name: Samuel [name] MRN: [id number] Date of Birth: [birth year] Referring Provider:   Doristine Devoid Pulmonary Rehab Walk Test from 10/06/2023 in [facility]  Referring Provider Ramaswamy       Initial Encounter Date:  Flowsheet Row Pulmonary Rehab Walk Test from 10/06/2023 in [facility]  Date 10/06/23       Visit Diagnosis: ILD (interstitial lung disease) (HCC)  Patient's Home Medications on Admission:   Current Outpatient Medications:    acyclovir (ZOVIRAX) 400 MG tablet, Take 1 tablet (400 mg total) by mouth 2 (two) times daily., Disp: 60 tablet, Rfl: 3   albuterol (PROVENTIL) (2.5 MG/3ML) 0.083% nebulizer solution, USE ONE VIAL IN NEBULIZER EVERY 6 HOURS AS NEEDED WHEEZING OR SHORTNESS OF BREATH, Disp: 120 mL, Rfl: 12   albuterol (VENTOLIN HFA) 108 (90 Base) MCG/ACT inhaler, Inhale 2 puffs into the lungs every 6 hours as needed for wheezing or shortness of breath., Disp: 8.5 g, Rfl: 1   ALPRAZolam (XANAX) 0.5 MG tablet, Take 0.5 mg by mouth at bedtime., Disp: , Rfl:    ARMOUR THYROID 90 MG tablet, Take 90 mg by mouth daily., Disp: , Rfl:    aspirin EC 81 MG tablet, Take 1 tablet (81 mg total) by mouth daily. Swallow whole., Disp: 90 tablet, Rfl: 3   ASSESS FULL RANGE PEAK METER DEVI, as directed., Disp: , Rfl:    azelastine (ASTELIN) 0.1 % nasal spray, Place 2 sprays into both nostrils 2 (two) times daily. Use in each nostril as directed, Disp: 30 mL, Rfl: 12   bisoprolol (ZEBETA) 5 MG tablet, Take 1 tablet (5 mg total) by mouth daily., Disp: 30 tablet, Rfl: 2   Budeson-Glycopyrrol-Formoterol (BREZTRI AEROSPHERE) 160-9-4.8 MCG/ACT AERO, Inhale 2 puffs into the lungs in the morning and at bedtime., Disp: 10.7 g, Rfl: 11   cetirizine (ZYRTEC) 10 MG tablet, Take 1 tablet (10 mg total) by mouth daily., Disp: 30  tablet, Rfl: 11   cholecalciferol (VITAMIN D) 1000 UNITS tablet, Take 3,000 Units by mouth daily., Disp: , Rfl:    Dextromethorphan HBr (DELSYM PO), Take by mouth as needed. For cough, Disp: , Rfl:    dextromethorphan-guaiFENesin (MUCINEX DM) 30-600 MG 12hr tablet, Take 1 tablet by mouth 2 (two) times daily., Disp: , Rfl:    EPINEPHrine (EPI-PEN) 0.3 mg/0.3 mL DEVI, Inject 0.3 mg into the muscle as needed., Disp: , Rfl:    ezetimibe-simvastatin (VYTORIN) 10-40 MG tablet, Take 1 tablet by mouth daily., Disp: 90 tablet, Rfl: 3   fluticasone (FLONASE) 50 MCG/ACT nasal spray, 1 spray., Disp: , Rfl:    Guaifenesin 1200 MG TB12, , Disp: , Rfl:    hydrOXYzine (ATARAX) 25 MG tablet, Take 25 mg by mouth every 8 (eight) hours as needed., Disp: , Rfl:    ibuprofen (ADVIL) 200 MG tablet, Take 200 mg by mouth every 6 (six) hours as needed., Disp: , Rfl:    ketoconazole (NIZORAL) 2 % cream, SMARTSIG:1 Application Topical 1 to 2 Times Daily, Disp: , Rfl:    levothyroxine (SYNTHROID) 50 MCG tablet, Take 50 mcg by mouth daily before breakfast., Disp: , Rfl:    montelukast (SINGULAIR) 10 MG tablet, Take 10 mg by mouth at bedtime., Disp: , Rfl:    Multiple Vitamin (MULTIVITAMIN) tablet, Take 1 tablet by mouth daily., Disp: , Rfl:    omeprazole (PRILOSEC) 40 MG  capsule, Take 1 capsule (40 mg total) by mouth 2 (two) times daily. Take 30 minutes before a meal, Disp: 60 capsule, Rfl: 3   OXYGEN, Inhale 2 L into the lungs continuous. As needed, Disp: , Rfl:    predniSONE (DELTASONE) 5 MG tablet, Daily for 3 weeks and then every other day, Disp: 30 tablet, Rfl: 1   sildenafil (REVATIO) 20 MG tablet, Take 20 mg by mouth daily as needed (ED)., Disp: , Rfl:    Simethicone (SIMETHICONE ULTRA STRENGTH) 180 MG CAPS, Take 1 capsule (180 mg total) by mouth 3 (three) times daily as needed., Disp: 90 capsule, Rfl: 0   sodium chloride (OCEAN) 0.65 % SOLN nasal spray, Place 1 spray into both nostrils as needed for congestion., Disp:  , Rfl:    tamsulosin (FLOMAX) 0.4 MG CAPS capsule, Take 0.4 mg by mouth daily., Disp: , Rfl:    triamcinolone cream (KENALOG) 0.1 %, Apply 1 application topically daily as needed (sun burn itch)., Disp: , Rfl:    Turmeric 400 MG CAPS, 1 capsule Orally once a day, Disp: , Rfl:    UNABLE TO FIND, Med Name: Allergy injections once weekly, Disp: , Rfl:   Past Medical History: Past Medical History:  Diagnosis Date   Asthma    GERD (gastroesophageal reflux disease)    High cholesterol    under control.    History of blood transfusion    Hypothyroidism    Interstitial lung disease (HCC)    Multiple myeloma (HCC)    Perennial allergic rhinitis    Pneumonia    Sciatic pain, right    Seasonal allergic rhinitis    Thyroid disease     Tobacco Use: Social History   Tobacco Use  Smoking Status Former   Current packs/day: 0.00   Average packs/day: 0.1 packs/day for 3.0 years (0.3 ttl pk-yrs)   Types: Cigarettes   Start date: 10/22/1979   Quit date: 10/21/1982   Years since quitting: 41.1  Smokeless Tobacco Never    Labs: Review Flowsheet       Latest Ref Rng & Units 06/15/2021 07/12/2021 09/27/2021  Labs for ITP Cardiac and Pulmonary Rehab  Hemoglobin A1c 4.6 - 6.5 % - 5.7  6.2   PH, Arterial 7.350 - 7.450 7.445  - -  PCO2 arterial 32.0 - 48.0 mmHg 34.0  - -  Bicarbonate 20.0 - 28.0 mmol/L 23.0  - -  O2 Saturation % 97.6  - -    Capillary Blood Glucose: No results found for: "GLUCAP"   Pulmonary Assessment Scores:  Pulmonary Assessment Scores     Row Name 10/06/23 1017         ADL UCSD   ADL Phase Entry     SOB Score total 63       CAT Score   CAT Score 19       mMRC Score   mMRC Score 4             UCSD: Self-administered rating of dyspnea associated with activities of daily living (ADLs) 6-point scale (0 = "not at all" to 5 = "maximal or unable to do because of breathlessness")  Scoring Scores range from 0 to 120.  Minimally important difference is 5  units  CAT: CAT can identify the health impairment of COPD patients and is better correlated with disease progression.  CAT has a scoring range of zero to 40. The CAT score is classified into four groups of low (less than 10), medium (10 -  20), high (21-30) and very high (31-40) based on the impact level of disease on health status. A CAT score over 10 suggests significant symptoms.  A worsening CAT score could be explained by an exacerbation, poor medication adherence, poor inhaler technique, or progression of COPD or comorbid conditions.  CAT MCID is 2 points  mMRC: mMRC (Modified Medical Research Council) Dyspnea Scale is used to assess the degree of baseline functional disability in patients of respiratory disease due to dyspnea. No minimal important difference is established. A decrease in score of 1 point or greater is considered a positive change.   Pulmonary Function Assessment:  Pulmonary Function Assessment - 10/06/23 0957       Breath   Bilateral Breath Sounds Rales    Shortness of Breath Yes;Limiting activity;Panic with Shortness of Breath;Fear of Shortness of Breath             Exercise Target Goals: Exercise Program Goal: Individual exercise prescription set using results from initial 6 min walk test and THRR while considering  patient's activity barriers and safety.   Exercise Prescription Goal: Initial exercise prescription builds to 30-45 minutes a day of aerobic activity, 2-3 days per week.  Home exercise guidelines will be given to patient during program as part of exercise prescription that the participant will acknowledge.  Activity Barriers & Risk Stratification:  Activity Barriers & Cardiac Risk Stratification - 10/06/23 0951       Activity Barriers & Cardiac Risk Stratification   Activity Barriers Deconditioning;Muscular Weakness;Shortness of Breath    Cardiac Risk Stratification Moderate             6 Minute Walk:  6 Minute Walk     Row Name  10/06/23 1043         6 Minute Walk   Phase Initial     Distance 730 feet     Distance Feet Change 730 ft     Walk Time 6 minutes     # of Rest Breaks 0     MPH 1.38     METS 1.7     RPE 12     Perceived Dyspnea  1.5     VO2 Peak 5.97     Symptoms No     Resting HR 56 bpm     Resting Oxygen Saturation  95 %     Exercise Oxygen Saturation  during 6 min walk 89 %     Max Ex. HR 74 bpm     Max Ex. BP 128/70     2 Minute Post BP 112/68       Interval HR   1 Minute HR 68     2 Minute HR 72     3 Minute HR 74     4 Minute HR 74     5 Minute HR 74     6 Minute HR 74     2 Minute Post HR 57     Interval Heart Rate? Yes       Interval Oxygen   Interval Oxygen? Yes     Baseline Oxygen Saturation % 95 %     1 Minute Oxygen Saturation % 97 %     1 Minute Liters of Oxygen 2 L     2 Minute Oxygen Saturation % 96 %     2 Minute Liters of Oxygen 2 L     3 Minute Oxygen Saturation % 91 %     3 Minute Liters of Oxygen 2  L     4 Minute Oxygen Saturation % 89 %     4 Minute Liters of Oxygen 2 L     5 Minute Oxygen Saturation % 90 %     5 Minute Liters of Oxygen 2 L     6 Minute Oxygen Saturation % 90 %     6 Minute Liters of Oxygen 2 L     2 Minute Post Oxygen Saturation % 97 %     2 Minute Post Liters of Oxygen 2 L              Oxygen Initial Assessment:  Oxygen Initial Assessment - 11/03/23 0900       Home Oxygen   Home Oxygen Device Home Concentrator;E-Tanks;Portable Concentrator    Sleep Oxygen Prescription Continuous    Liters per minute 2    Home Exercise Oxygen Prescription Continuous    Liters per minute 2    Home Resting Oxygen Prescription Continuous    Liters per minute 2    Compliance with Home Oxygen Use Yes      Program Oxygen Prescription   Program Oxygen Prescription Continuous    Liters per minute 2      Intervention   Short Term Goals To learn and exhibit compliance with exercise, home and travel O2 prescription;To learn and understand  importance of monitoring SPO2 with pulse oximeter and demonstrate accurate use of the pulse oximeter.;To learn and understand importance of maintaining oxygen saturations>88%;To learn and demonstrate proper pursed lip breathing techniques or other breathing techniques. ;To learn and demonstrate proper use of respiratory medications    Long  Term Goals Exhibits compliance with exercise, home  and travel O2 prescription;Maintenance of O2 saturations>88%;Compliance with respiratory medication;Verbalizes importance of monitoring SPO2 with pulse oximeter and return demonstration;Exhibits proper breathing techniques, such as pursed lip breathing or other method taught during program session;Demonstrates proper use of MDI's             Oxygen Re-Evaluation:  Oxygen Re-Evaluation     Row Name 10/08/23 1036 12/01/23 1106           Program Oxygen Prescription   Program Oxygen Prescription Continuous Continuous      Liters per minute 2 2        Home Oxygen   Home Oxygen Device Home Concentrator;E-Tanks;Portable Concentrator Home Concentrator;E-Tanks;Portable Concentrator      Sleep Oxygen Prescription Continuous Continuous      Liters per minute 2 2      Home Exercise Oxygen Prescription Continuous Continuous      Liters per minute 2 2      Home Resting Oxygen Prescription Continuous Continuous      Liters per minute 2 2      Compliance with Home Oxygen Use Yes Yes        Goals/Expected Outcomes   Short Term Goals To learn and exhibit compliance with exercise, home and travel O2 prescription;To learn and understand importance of monitoring SPO2 with pulse oximeter and demonstrate accurate use of the pulse oximeter.;To learn and understand importance of maintaining oxygen saturations>88%;To learn and demonstrate proper pursed lip breathing techniques or other breathing techniques. ;To learn and demonstrate proper use of respiratory medications To learn and exhibit compliance with exercise, home  and travel O2 prescription;To learn and understand importance of monitoring SPO2 with pulse oximeter and demonstrate accurate use of the pulse oximeter.;To learn and understand importance of maintaining oxygen saturations>88%;To learn and demonstrate proper pursed lip breathing techniques or other  breathing techniques. ;To learn and demonstrate proper use of respiratory medications      Long  Term Goals Exhibits compliance with exercise, home  and travel O2 prescription;Maintenance of O2 saturations>88%;Compliance with respiratory medication;Verbalizes importance of monitoring SPO2 with pulse oximeter and return demonstration;Exhibits proper breathing techniques, such as pursed lip breathing or other method taught during program session;Demonstrates proper use of MDI's Exhibits compliance with exercise, home  and travel O2 prescription;Maintenance of O2 saturations>88%;Compliance with respiratory medication;Verbalizes importance of monitoring SPO2 with pulse oximeter and return demonstration;Exhibits proper breathing techniques, such as pursed lip breathing or other method taught during program session;Demonstrates proper use of MDI's      Goals/Expected Outcomes Compliance and understanding of oxygen saturation monitoring and breathing techniques to decrease shortness of breath. --               Oxygen Discharge (Final Oxygen Re-Evaluation):  Oxygen Re-Evaluation - 12/01/23 1106       Program Oxygen Prescription   Program Oxygen Prescription Continuous    Liters per minute 2      Home Oxygen   Home Oxygen Device Home Concentrator;E-Tanks;Portable Concentrator    Sleep Oxygen Prescription Continuous    Liters per minute 2    Home Exercise Oxygen Prescription Continuous    Liters per minute 2    Home Resting Oxygen Prescription Continuous    Liters per minute 2    Compliance with Home Oxygen Use Yes      Goals/Expected Outcomes   Short Term Goals To learn and exhibit compliance with  exercise, home and travel O2 prescription;To learn and understand importance of monitoring SPO2 with pulse oximeter and demonstrate accurate use of the pulse oximeter.;To learn and understand importance of maintaining oxygen saturations>88%;To learn and demonstrate proper pursed lip breathing techniques or other breathing techniques. ;To learn and demonstrate proper use of respiratory medications    Long  Term Goals Exhibits compliance with exercise, home  and travel O2 prescription;Maintenance of O2 saturations>88%;Compliance with respiratory medication;Verbalizes importance of monitoring SPO2 with pulse oximeter and return demonstration;Exhibits proper breathing techniques, such as pursed lip breathing or other method taught during program session;Demonstrates proper use of MDI's             Initial Exercise Prescription:  Initial Exercise Prescription - 10/06/23 1000       Date of Initial Exercise RX and Referring Provider   Date 10/06/23    Referring Provider Ramaswamy    Expected Discharge Date 01/01/23      Oxygen   Oxygen Continuous    Liters 2    Maintain Oxygen Saturation 88% or higher      Treadmill   MPH 2    Grade 1    Minutes 15      Recumbant Elliptical   Level 1    RPM 40    Watts 50    Minutes 15      Prescription Details   Frequency (times per week) 2    Duration Progress to 30 minutes of continuous aerobic without signs/symptoms of physical distress      Intensity   THRR 40-80% of Max Heartrate 61-122    Ratings of Perceived Exertion 11-13    Perceived Dyspnea 0-4      Progression   Progression Continue to progress workloads to maintain intensity without signs/symptoms of physical distress.      Resistance Training   Training Prescription Yes    Weight blue bands    Reps 10-15  Perform Capillary Blood Glucose checks as needed.  Exercise Prescription Changes:   Exercise Prescription Changes     Row Name 10/14/23 0900 10/21/23  0938 10/28/23 0900 12/09/23 1400       Response to Exercise   Blood Pressure (Admit) 128/80 102/64 -- 136/76    Blood Pressure (Exercise) 152/70 128/68 -- 156/70    Blood Pressure (Exit) 120/66 106/68 -- 130/70    Heart Rate (Admit) 64 bpm 67 bpm -- 82 bpm    Heart Rate (Exercise) 80 bpm 83 bpm -- 85 bpm    Heart Rate (Exit) 77 bpm 80 bpm -- 92 bpm    Oxygen Saturation (Admit) 100 % 95 % -- 97 %    Oxygen Saturation (Exercise) 87 % 93 % -- 93 %    Oxygen Saturation (Exit) 97 % 100 % -- 97 %    Rating of Perceived Exertion (Exercise) 13 12 -- 13    Perceived Dyspnea (Exercise) 1 2 -- 3    Duration Continue with 30 min of aerobic exercise without signs/symptoms of physical distress. Continue with 30 min of aerobic exercise without signs/symptoms of physical distress. -- Continue with 30 min of aerobic exercise without signs/symptoms of physical distress.    Intensity THRR unchanged THRR unchanged -- THRR unchanged      Progression   Progression Continue to progress workloads to maintain intensity without signs/symptoms of physical distress. Continue to progress workloads to maintain intensity without signs/symptoms of physical distress. -- Continue to progress workloads to maintain intensity without signs/symptoms of physical distress.    Average METs 1.9 -- -- --      Paramedic Prescription Yes Yes -- Yes    Weight blue bands blue bands -- blue bands    Reps 10-15 10-15 -- 10-15    Time 10 Minutes 10 Minutes -- 10 Minutes      Interval Training   Interval Training Yes -- -- --    Equipment Treadmill;Recumbant Elliptical -- -- --      Oxygen   Oxygen Continuous Continuous -- Continuous    Liters 8 3-8 -- 3-10      Treadmill   MPH 1.5 1.8 -- 1.5    Grade 0 0 -- 0    Minutes 15 15 -- 15    METs -- 2.3 -- 1.9      Recumbant Elliptical   Level 1 3 -- 3    RPM 40 45 -- --    Watts -- 51 -- --    Minutes 15 15 -- 15    METs 1.8 2 -- 2.1      Oxygen    Maintain Oxygen Saturation -- 88% or higher -- 88% or higher             Exercise Comments:   Exercise Comments     Row Name 10/14/23 0946           Exercise Comments Pt completed first day of group exercise. He exercised on the recumbent elliptical for 15 min, level 1, METs 1.8. He then walked on the treadmill for 15 min, speed 1.5, 0 incline. Pt required increased O2 to 8L on TM. He was able to perform warm up and cool down standing, including squats. Tolerated exercise fair, admits he is tired. Discussed METs with good reception.                Exercise Goals and Review:   Exercise Goals  Row Name 10/06/23 0952             Exercise Goals   Increase Physical Activity Yes       Intervention Provide advice, education, support and counseling about physical activity/exercise needs.;Develop an individualized exercise prescription for aerobic and resistive training based on initial evaluation findings, risk stratification, comorbidities and participant's personal goals.       Expected Outcomes Short Term: Attend rehab on a regular basis to increase amount of physical activity.;Long Term: Add in home exercise to make exercise part of routine and to increase amount of physical activity.;Long Term: Exercising regularly at least 3-5 days a week.       Increase Strength and Stamina Yes       Intervention Provide advice, education, support and counseling about physical activity/exercise needs.;Develop an individualized exercise prescription for aerobic and resistive training based on initial evaluation findings, risk stratification, comorbidities and participant's personal goals.       Expected Outcomes Short Term: Increase workloads from initial exercise prescription for resistance, speed, and METs.;Short Term: Perform resistance training exercises routinely during rehab and add in resistance training at home;Long Term: Improve cardiorespiratory fitness, muscular endurance and  strength as measured by increased METs and functional capacity ( )       Able to understand and use rate of perceived exertion (RPE) scale Yes       Intervention Provide education and explanation on how to use RPE scale       Expected Outcomes Short Term: Able to use RPE daily in rehab to express subjective intensity level;Long Term:  Able to use RPE to guide intensity level when exercising independently       Able to understand and use Dyspnea scale Yes       Intervention Provide education and explanation on how to use Dyspnea scale       Expected Outcomes Short Term: Able to use Dyspnea scale daily in rehab to express subjective sense of shortness of breath during exertion;Long Term: Able to use Dyspnea scale to guide intensity level when exercising independently       Knowledge and understanding of Target Heart Rate Range (THRR) Yes       Intervention Provide education and explanation of THRR including how the numbers were predicted and where they are located for reference       Expected Outcomes Short Term: Able to state/look up THRR;Long Term: Able to use THRR to govern intensity when exercising independently;Short Term: Able to use daily as guideline for intensity in rehab       Understanding of Exercise Prescription Yes       Intervention Provide education, explanation, and written materials on patient's individual exercise prescription       Expected Outcomes Short Term: Able to explain program exercise prescription;Long Term: Able to explain home exercise prescription to exercise independently                Exercise Goals Re-Evaluation :  Exercise Goals Re-Evaluation     Row Name 10/08/23 1035 11/03/23 0857 12/01/23 1100         Exercise Goal Re-Evaluation   Exercise Goals Review Increase Physical Activity;Able to understand and use Dyspnea scale;Understanding of Exercise Prescription;Increase Strength and Stamina;Knowledge and understanding of Target Heart Rate Range  (THRR);Able to understand and use rate of perceived exertion (RPE) scale Increase Physical Activity;Able to understand and use Dyspnea scale;Understanding of Exercise Prescription;Increase Strength and Stamina;Knowledge and understanding of Target Heart Rate Range (THRR);Able to understand  and use rate of perceived exertion (RPE) scale Increase Physical Activity;Able to understand and use Dyspnea scale;Understanding of Exercise Prescription;Increase Strength and Stamina;Knowledge and understanding of Target Heart Rate Range (THRR);Able to understand and use rate of perceived exertion (RPE) scale     Comments [name] is scheduled to begin exercise on 12/24. Will monitor and progress as able. [name] has completed 3 exercise sessions. He exercises for 15 min on the recumbent elliptical and treadmill. Jim averages 2 METs at level 3 on the recumbent elliptical and 2.3 METs at 1.8 mph on the treadmill. He performs the warmup and cooldown standing without limitations. It is too soon to notate any discernable progressions. [name] did miss class the past week due to illness. Will continue to monitor and progress as able. [name] has completed 3 exercise sessions. He exercises for 15 min on the recumbent elliptical and treadmill. Jim averages 2 METs at level 3 on the recumbent elliptical and 2.3 METs at 1.8 mph on the treadmill. He performs the warmup and cooldown standing without limitations.[name] has missed several sessions due to medical hold and personal committments. He plans to come back 2/11. Will continue to monitor and progress as able.     Expected Outcomes Through exercise at rehab and home, the patient will decrease shortness of breath with daily activities and feel confident in carrying out an exercise regimen at home. Through exercise at rehab and home, the patient will decrease shortness of breath with daily activities and feel confident in carrying out an exercise regimen at home. Through exercise at rehab and home, the  patient will decrease shortness of breath with daily activities and feel confident in carrying out an exercise regimen at home.              Discharge Exercise Prescription (Final Exercise Prescription Changes):  Exercise Prescription Changes - 12/09/23 1400       Response to Exercise   Blood Pressure (Admit) 136/76    Blood Pressure (Exercise) 156/70    Blood Pressure (Exit) 130/70    Heart Rate (Admit) 82 bpm    Heart Rate (Exercise) 85 bpm    Heart Rate (Exit) 92 bpm    Oxygen Saturation (Admit) 97 %    Oxygen Saturation (Exercise) 93 %    Oxygen Saturation (Exit) 97 %    Rating of Perceived Exertion (Exercise) 13    Perceived Dyspnea (Exercise) 3    Duration Continue with 30 min of aerobic exercise without signs/symptoms of physical distress.    Intensity THRR unchanged      Progression   Progression Continue to progress workloads to maintain intensity without signs/symptoms of physical distress.      Resistance Training   Training Prescription Yes    Weight blue bands    Reps 10-15    Time 10 Minutes      Oxygen   Oxygen Continuous    Liters 3-10      Treadmill   MPH 1.5    Grade 0    Minutes 15    METs 1.9      Recumbant Elliptical   Level 3    Minutes 15    METs 2.1      Oxygen   Maintain Oxygen Saturation 88% or higher             Nutrition:  Target Goals: Understanding of nutrition guidelines, daily intake of sodium 1500mg , cholesterol 200mg , calories 30% from fat and 7% or less from saturated fats, daily to have  5 or more servings of fruits and vegetables.  Biometrics:    Nutrition Therapy Plan and Nutrition Goals:   Nutrition Assessments:  MEDIFICTS Score Key: >=70 Need to make dietary changes  40-70 Heart Healthy Diet <= 40 Therapeutic Level Cholesterol Diet   Picture Your Plate Scores: <16 Unhealthy dietary pattern with much room for improvement. 41-50 Dietary pattern unlikely to meet recommendations for good health and  room for improvement. 51-60 More healthful dietary pattern, with some room for improvement.  >60 Healthy dietary pattern, although there may be some specific behaviors that could be improved.    Nutrition Goals Re-Evaluation:   Nutrition Goals Discharge (Final Nutrition Goals Re-Evaluation):   Psychosocial: Target Goals: Acknowledge presence or absence of significant depression and/or stress, maximize coping skills, provide positive support system. Participant is able to verbalize types and ability to use techniques and skills needed for reducing stress and depression.  Initial Review & Psychosocial Screening:  Initial Psych Review & Screening - 10/06/23 0949       Initial Review   Current issues with Current Anxiety/Panic;Current Psychotropic Meds;Current Stress Concerns    Source of Stress Concerns Chronic Illness;Unable to participate in former interests or hobbies    Comments Pt is stressed about his health. He also admits to having days of sadness because he is not able to do activities such as hiking anymore. He has good support from his family and friends.      Family Dynamics   Good Support System? Yes      Barriers   Psychosocial barriers to participate in program The patient should benefit from training in stress management and relaxation.;Psychosocial barriers identified (see note)      Screening Interventions   Interventions Encouraged to exercise    Expected Outcomes Long Term Goal: Stressors or current issues are controlled or eliminated.;Short Term goal: Utilizing psychosocial counselor, staff and physician to assist with identification of specific Stressors or current issues interfering with healing process. Setting desired goal for each stressor or current issue identified.             Quality of Life Scores:  Scores of 19 and below usually indicate a poorer quality of life in these areas.  A difference of  2-3 points is a clinically meaningful difference.  A  difference of 2-3 points in the total score of the Quality of Life Index has been associated with significant improvement in overall quality of life, self-image, physical symptoms, and general health in studies assessing change in quality of life.  PHQ-9: Review Flowsheet       10/06/2023  Depression screen PHQ 2/9  Decreased Interest 1  Down, Depressed, Hopeless 1  PHQ - 2 Score 2  Altered sleeping 0  Tired, decreased energy 0  Change in appetite 0  Feeling bad or failure about yourself  1  Trouble concentrating 0  Moving slowly or fidgety/restless 1  Suicidal thoughts 0  PHQ-9 Score 4  Difficult doing work/chores Somewhat difficult   Interpretation of Total Score  Total Score Depression Severity:  1-4 = Minimal depression, 5-9 = Mild depression, 10-14 = Moderate depression, 15-19 = Moderately severe depression, 20-27 = Severe depression   Psychosocial Evaluation and Intervention:  Psychosocial Evaluation - 10/06/23 0950       Psychosocial Evaluation & Interventions   Interventions Encouraged to exercise with the program and follow exercise prescription;Stress management education    Comments [name] admits to having stress due to his current health issues. He also admits to  being "a little down". some days due to not being able to do certain activities anymore.  He is currently taking psychostropic meds. He denies needing a referral for a therapist at this time.    Expected Outcomes For [name] to have less stress throughout the program    Continue Psychosocial Services  No Follow up required             Psychosocial Re-Evaluation:  Psychosocial Re-Evaluation     Row Name 10/08/23 1212 11/10/23 0954 12/03/23 1501         Psychosocial Re-Evaluation   Current issues with Current Depression;Current Psychotropic Meds;Current Stress Concerns Current Depression;Current Psychotropic Meds;Current Stress Concerns Current Depression;Current Psychotropic Meds;Current Stress Concerns      Comments No changes since orienation. [name] is scheduled to start the program on 10/14/23. [name] has been placed on medical hold due to his worsening disease. His last class was on 10/21/23. He is currently being seen by speech therapy, oncology, and pulmonary. [name] has returned to class and is in good spirits. He is glad to be back in class. He feels his depression is stable at this time. No new psychosocial barriers or concerns at this time.     Expected Outcomes For [name] to attend PR free of any psy/soc barriers or concerns. For [name] to use positive coping mechanisms to reduce his stress. For [name] to attend PR free of any psy/soc barriers or concerns. For [name] to use positive coping mechanisms to reduce his stress. For [name] to attend PR free of any psy/soc barriers or concerns. For [name] to use positive coping mechanisms to reduce his stress.     Interventions Encouraged to attend Pulmonary Rehabilitation for the exercise Encouraged to attend Pulmonary Rehabilitation for the exercise Encouraged to attend Pulmonary Rehabilitation for the exercise     Continue Psychosocial Services  No Follow up required Follow up required by staff Follow up required by staff              Psychosocial Discharge (Final Psychosocial Re-Evaluation):  Psychosocial Re-Evaluation - 12/03/23 1501       Psychosocial Re-Evaluation   Current issues with Current Depression;Current Psychotropic Meds;Current Stress Concerns    Comments [name] has returned to class and is in good spirits. He is glad to be back in class. He feels his depression is stable at this time. No new psychosocial barriers or concerns at this time.    Expected Outcomes For [name] to attend PR free of any psy/soc barriers or concerns. For [name] to use positive coping mechanisms to reduce his stress.    Interventions Encouraged to attend Pulmonary Rehabilitation for the exercise    Continue Psychosocial Services  Follow up required by staff              Education: Education Goals: Education classes will be provided on a weekly basis, covering required topics. Participant will state understanding/return demonstration of topics presented.  Learning Barriers/Preferences:  Learning Barriers/Preferences - 10/06/23 0950       Learning Barriers/Preferences   Learning Barriers Sight    Learning Preferences Individual Instruction;Written Material             Education Topics: Know Your Numbers Group instruction that is supported by a PowerPoint presentation. Instructor discusses importance of knowing and understanding resting, exercise, and post-exercise oxygen saturation, heart rate, and blood pressure. Oxygen saturation, heart rate, blood pressure, rating of perceived exertion, and dyspnea are reviewed along with a normal range for these values.  Exercise for the Pulmonary Patient Group instruction that is supported by a PowerPoint presentation. Instructor discusses benefits of exercise, core components of exercise, frequency, duration, and intensity of an exercise routine, importance of utilizing pulse oximetry during exercise, safety while exercising, and options of places to exercise outside of rehab.  Flowsheet Row PULMONARY REHAB OTHER RESPIRATORY from 10/16/2023 in [facility]  Date 10/16/23  Educator EP  Instruction Review Code 1- Verbalizes Understanding       MET Level  Group instruction provided by PowerPoint, verbal discussion, and written material to support subject matter. Instructor reviews what METs are and how to increase METs.    Pulmonary Medications Verbally interactive group education provided by instructor with focus on inhaled medications and proper administration.   Anatomy and Physiology of the Respiratory System Group instruction provided by PowerPoint, verbal discussion, and written material to support subject matter. Instructor reviews  respiratory cycle and anatomical components of the respiratory system and their functions. Instructor also reviews differences in obstructive and restrictive respiratory diseases with examples of each.    Oxygen Safety Group instruction provided by PowerPoint, verbal discussion, and written material to support subject matter. There is an overview of "What is Oxygen" and "Why do we need it".  Instructor also reviews how to create a safe environment for oxygen use, the importance of using oxygen as prescribed, and the risks of noncompliance. There is a brief discussion on traveling with oxygen and resources the patient may utilize.   Oxygen Use Group instruction provided by PowerPoint, verbal discussion, and written material to discuss how supplemental oxygen is prescribed and different types of oxygen supply systems. Resources for more information are provided.    Breathing Techniques Group instruction that is supported by demonstration and informational handouts. Instructor discusses the benefits of pursed lip and diaphragmatic breathing and detailed demonstration on how to perform both.     Risk Factor Reduction Group instruction that is supported by a PowerPoint presentation. Instructor discusses the definition of a risk factor, different risk factors for pulmonary disease, and how the heart and lungs work together. Flowsheet Row PULMONARY REHAB OTHER RESPIRATORY from 12/04/2023 in [facility]  Date 12/04/23  Educator EP  Instruction Review Code 1- Verbalizes Understanding       Pulmonary Diseases Group instruction provided by PowerPoint, verbal discussion, and written material to support subject matter. Instructor gives an overview of the different type of pulmonary diseases. There is also a discussion on risk factors and symptoms as well as ways to manage the diseases.   Stress and Energy Conservation Group instruction  provided by PowerPoint, verbal discussion, and written material to support subject matter. Instructor gives an overview of stress and the impact it can have on the body. Instructor also reviews ways to reduce stress. There is also a discussion on energy conservation and ways to conserve energy throughout the day.   Warning Signs and Symptoms Group instruction provided by PowerPoint, verbal discussion, and written material to support subject matter. Instructor reviews warning signs and symptoms of stroke, heart attack, cold and flu. Instructor also reviews ways to prevent the spread of infection.   Other Education Group or individual verbal, written, or video instructions that support the educational goals of the pulmonary rehab program.    Knowledge Questionnaire Score:  Knowledge Questionnaire Score - 10/06/23 1019       Knowledge Questionnaire Score   Pre  Score 17/18             Core Components/Risk Factors/Patient Goals at Admission:  Personal Goals and Risk Factors at Admission - 10/06/23 0951       Core Components/Risk Factors/Patient Goals on Admission   Improve shortness of breath with ADL's Yes    Intervention Provide education, individualized exercise plan and daily activity instruction to help decrease symptoms of SOB with activities of daily living.    Expected Outcomes Short Term: Improve cardiorespiratory fitness to achieve a reduction of symptoms when performing ADLs;Long Term: Be able to perform more ADLs without symptoms or delay the onset of symptoms    Increase knowledge of respiratory medications and ability to use respiratory devices properly  Yes    Intervention Provide education and demonstration as needed of appropriate use of medications, inhalers, and oxygen therapy.    Expected Outcomes Short Term: Achieves understanding of medications use. Understands that oxygen is a medication prescribed by physician. Demonstrates appropriate use of inhaler and oxygen  therapy.;Long Term: Maintain appropriate use of medications, inhalers, and oxygen therapy.    Stress Yes    Intervention Offer individual and/or small group education and counseling on adjustment to heart disease, stress management and health-related lifestyle change. Teach and support self-help strategies.;Refer participants experiencing significant psychosocial distress to appropriate mental health specialists for further evaluation and treatment. When possible, include family members and significant others in education/counseling sessions.    Expected Outcomes Short Term: Participant demonstrates changes in health-related behavior, relaxation and other stress management skills, ability to obtain effective social support, and compliance with psychotropic medications if prescribed.;Long Term: Emotional wellbeing is indicated by absence of clinically significant psychosocial distress or social isolation.             Core Components/Risk Factors/Patient Goals Review:   Goals and Risk Factor Review     Row Name 10/08/23 1220 11/10/23 0956 12/03/23 1504         Core Components/Risk Factors/Patient Goals Review   Personal Goals Review Develop more efficient breathing techniques such as purse lipped breathing and diaphragmatic breathing and practicing self-pacing with activity.;Increase knowledge of respiratory medications and ability to use respiratory devices properly.;Improve shortness of breath with ADL's;Stress Develop more efficient breathing techniques such as purse lipped breathing and diaphragmatic breathing and practicing self-pacing with activity.;Increase knowledge of respiratory medications and ability to use respiratory devices properly.;Improve shortness of breath with ADL's;Stress Improve shortness of breath with ADL's;Develop more efficient breathing techniques such as purse lipped breathing and diaphragmatic breathing and practicing self-pacing with activity.;Increase knowledge of  respiratory medications and ability to use respiratory devices properly.;Stress     Review No changes since orientation. [name] is scheduled to start the program on 10/14/23 [name] has been placed on medical hold due to his worsening disease. He attended 3 classes so far. Unable to assess goals yet. His last class was on 10/21/23. He is currently being seen by speech therapy, oncology, and pulmonary. He will continue to benefit from PR for nutrition, education, exercise, and lifestyle modification. [name] has returned back to class and has attended on session.  Goal progressing for developing more efficient breathing techniques such as purse lipped breathing and diaphragmatic breathing; and practicing self-pacing with activity. Goal progressing for improving shortness of breath with ADL's. Goal progressing for increase knowledge of respiratory medications and ability to use respiratory devices properly. Goal progressing for improving stress.  We will continue to monitor her progress throughout the program.     Expected Outcomes  For [name] to develop more efficient breathing techniques such as purse lipped breathing and diaphragmatic breathing; and practicing self-pacing activities, to increase his knowledge of respiratory medications and ability to use respiratory devices properly, improve his shortness of breath with ADLs, and to reduce stress. For [name] to develop more efficient breathing techniques such as purse lipped breathing and diaphragmatic breathing; and practicing self-pacing activities, to increase his knowledge of respiratory medications and ability to use respiratory devices properly, improve his shortness of breath with ADLs, and to reduce stress. For [name] to develop more efficient breathing techniques such as purse lipped breathing and diaphragmatic breathing; and practicing self-pacing activities, to increase his knowledge of respiratory medications and ability to use respiratory devices properly, improve his  shortness of breath with ADLs, and to reduce stress.              Core Components/Risk Factors/Patient Goals at Discharge (Final Review):   Goals and Risk Factor Review - 12/03/23 1504       Core Components/Risk Factors/Patient Goals Review   Personal Goals Review Improve shortness of breath with ADL's;Develop more efficient breathing techniques such as purse lipped breathing and diaphragmatic breathing and practicing self-pacing with activity.;Increase knowledge of respiratory medications and ability to use respiratory devices properly.;Stress    Review [name] has returned back to class and has attended on session.  Goal progressing for developing more efficient breathing techniques such as purse lipped breathing and diaphragmatic breathing; and practicing self-pacing with activity. Goal progressing for improving shortness of breath with ADL's. Goal progressing for increase knowledge of respiratory medications and ability to use respiratory devices properly. Goal progressing for improving stress.  We will continue to monitor her progress throughout the program.    Expected Outcomes For [name] to develop more efficient breathing techniques such as purse lipped breathing and diaphragmatic breathing; and practicing self-pacing activities, to increase his knowledge of respiratory medications and ability to use respiratory devices properly, improve his shortness of breath with ADLs, and to reduce stress.             ITP Comments: Pt is making expected progress toward Pulmonary Rehab goals after completing 6 session(s). Recommend continued exercise, life style modification, education, and utilization of breathing techniques to increase stamina and strength, while also decreasing shortness of breath with exertion.  Dr. Mechele Collin is Medical Director for Pulmonary Rehab at [facility].

## 2023-12-11 ENCOUNTER — Encounter (HOSPITAL_COMMUNITY): Payer: Medicare Other

## 2023-12-15 ENCOUNTER — Ambulatory Visit: Payer: Medicare Other

## 2023-12-15 DIAGNOSIS — R498 Other voice and resonance disorders: Secondary | ICD-10-CM | POA: Diagnosis not present

## 2023-12-15 NOTE — Patient Instructions (Signed)
-   Try yoga; exercise that focuses on breathing, relaxing our muscles and smooth controlled breathing, reduced tension is the goal.  - Practice exercise/movement with item ~20 pounds (weight of tank) to stimulate carrying around the tank, even if it seems "light"/"not too heavy" still requires additional effort/breath support and could impact effort needed for daily movements/interactions.

## 2023-12-15 NOTE — Therapy (Signed)
 OUTPATIENT SPEECH LANGUAGE PATHOLOGY VOICE TREATMENT    Patient Name: Samuel [name] MRN: [id number] DOB:May 15, 1956, 68 y.o., male Today's Date: 12/15/2023  PCP: Tally Joe, MD REFERRING PROVIDER: Tally Joe, MD  END OF SESSION:  End of Session - 12/15/23 1158     Visit Number 24    Number of Visits 30    Date for SLP Re-Evaluation 12/29/23    Authorization Type medicare/ BCBS    SLP Start Time 1158    SLP Stop Time  1230    SLP Time Calculation (min) 32 min    Activity Tolerance Patient tolerated treatment well              Past Medical History:  Diagnosis Date   Asthma    GERD (gastroesophageal reflux disease)    High cholesterol    under control.    History of blood transfusion    Hypothyroidism    Interstitial lung disease (HCC)    Multiple myeloma (HCC)    Perennial allergic rhinitis    Pneumonia    Sciatic pain, right    Seasonal allergic rhinitis    Thyroid disease    Past Surgical History:  Procedure Laterality Date   BRONCHIAL BIOPSY  06/18/2021   Procedure: BRONCHIAL BIOPSIES;  Surgeon: Leslye Peer, MD;  Location: [facility];  Service: Cardiopulmonary;;   BRONCHIAL BIOPSY  08/28/2021   Procedure: BRONCHIAL BIOPSIES;  Surgeon: Josephine Igo, DO;  Location: [facility];  Service: Cardiopulmonary;;   BRONCHIAL WASHINGS  06/18/2021   Procedure: BRONCHIAL WASHINGS;  Surgeon: Leslye Peer, MD;  Location: [facility];  Service: Cardiopulmonary;;   BRONCHIAL WASHINGS  08/28/2021   Procedure: BRONCHIAL WASHINGS;  Surgeon: Josephine Igo, DO;  Location: [facility];  Service: Cardiopulmonary;;   NASAL SINUS SURGERY     NECK SURGERY  1996   VIDEO BRONCHOSCOPY N/A 06/18/2021   Procedure: VIDEO BRONCHOSCOPY WITH FLUORO;  Surgeon: Leslye Peer, MD;  Location: [facility];  Service: Cardiopulmonary;  Laterality: N/A;   VIDEO BRONCHOSCOPY N/A 08/28/2021   Procedure: VIDEO BRONCHOSCOPY WITH FLUORO;  Surgeon: Josephine Igo, DO;  Location:  [facility];  Service: Cardiopulmonary;  Laterality: N/A;   Patient Active Problem List   Diagnosis Date Noted   Essential hypertension 07/04/2023   Coronary artery disease 11/28/2022   Mixed hyperlipidemia 11/28/2022   Recurrent sinusitis 11/04/2022   Acute bacterial rhinosinusitis 09/26/2022   Chronic respiratory failure with hypoxia (HCC) 08/01/2022   S/P bronchoscopy    ILD (interstitial lung disease) (HCC)    Preop cardiovascular exam 08/21/2021   Pneumonitis 07/20/2021   Anxiety 07/20/2021   Interstitial pulmonary disease (HCC) 07/20/2021   Abnormal CT of the chest 06/18/2021   Dyspnea 06/15/2021   Hypoxia 06/15/2021   Multiple myeloma (HCC) 03/05/2021   Multiple myeloma not having achieved remission (HCC) 03/05/2021   Bone lesion 02/14/2021   Monoclonal gammopathy 02/14/2021   High cholesterol    Allergic rhinitis 11/23/2020   Mild persistent asthma 11/23/2020   SOB (shortness of breath) 10/04/2011    Onset date: 05/15/23 (referral date)  REFERRING DIAG: R49.0 (ICD-10-CM) - Dysphonia  THERAPY DIAG: Other voice and resonance disorders  Rationale for Evaluation and Treatment: Rehabilitation  SUBJECTIVE:   SUBJECTIVE STATEMENT: "Lousy" Accompanied by: self  PERTINENT HISTORY: "Samuel [name] is a 68 y.o. male who presents as a return patient, for follow-up of sinusitis and globus sensation. At time of last visit on 03/14/2023, nasal culture was obtained following thick mucoid drainage noted in  the right nasal cavity. This was abnormal, and demonstrated gram-positive bacilli. Patient was subsequently treated with a 2-week course of oral antibiotics, and presents today for posttreatment imaging. He has continued to use proton pump inhibitor twice daily, but continues to experience frequent throat clearing and globus sensation. He states he has been working on dietary and lifestyle modifications as previously instructed, and has been partially successful. He still has  difficulty with frequent throat clearing and needing to cough often. He also has not been able to obtain humidification for his oxygen, as the DME company stated they needed a prescription.  Last visit 03/14/2023: Samuel [name] is a 68 y.o. male who presents as a new consult, referred by [name], FNP , for evaluation and treatment of sinus issues, hoarseness, and throat symptoms. He is accompanied by his wife.  Patient had history of multiple myeloma, status post treatment with chemotherapy. He developed interstitial lung disease following chemotherapeutic treatment, and is currently followed closely by pulmonology. He also endorses history of significant environmental allergies, and reports that recent allergy testing showed positivity to "everything". He was previously on immunotherapy, but this was held during his cancer treatment. He is considering starting back on the chemotherapy due to the significance of his symptoms. He is currently using Singulair, antihistamine and Patanase nasal spray on a daily basis. He also reports using nasal saline irrigations on a daily basis. He reports history of deviated nasal septum repair in 1983. He denies history of sinusitis requiring treatment with oral antibiotics in the last 12 months. His primary symptoms are nasal congestion and postnasal drainage. He denies facial pressure, pain, hyposmia, hypogeusia. He has not had any imaging of his head or sinuses. He does occasionally experience headaches. He uses oxygen on a nightly basis, and states that his oxygen is currently not humidified.  He reports symptoms of silent reflux, and states he is currently taking omeprazole on a daily basis. [name] is frequent throat clearing and globus sensation. He also reports occasional hoarseness."  PAIN: Are you having pain? No  FALLS: Has patient fallen in last 6 months? No  LIVING ENVIRONMENT: Lives with: lives with their family Lives in:  House/apartment  PLOF:Level of assistance: Independent with ADLs, Independent with IADLs Employment: Full-time employment  PATIENT GOALS: "control my breath, control my speech, and relaxation"   OBJECTIVE:   DIAGNOSTIC FINDINGS: MBSS 11/05/23:  Pt's oropharyngeal swallow is functional with adequate efficiency and safety. He has trace amounts of residue in his pyriform sinuses after the swallow, noted more with liquids than with solids. He does not have a lot of anterior hyoid excursion but he has no other overt pharyngeal deficits. No penetration or aspiration observed. Note that pt did report feeling like the purees and solids got stuck in his throat, which elicited coughing and throat clearing in an attempt to clear. When correlated with imaging, there was no residual in his pharynx. Question a referrred sensation and primary esophageal component (pt having esophagram after this study). Would continue regular solids and thin liquids with use of esophageal precautions. Would defer f/u to primary OP SLP.  ENT 04/04/23:  "Nasolaryngoscopy performed at last visit demonstrated moderate interarytenoid edema with otherwise normal upper airway exam. Counseled patient to continue twice daily omeprazole for next 2-3 months. We discussed dietary lifestyle modifications for treatment of reflux to include avoiding spicy and acidic foods which exacerbate reflux, avoiding late meals and snacking, avoiding caffeine and stimulants, avoiding carbonated and alcoholic beverages, adequate oral hydration, avoidance  of throat clearing and sleeping with the head of bed elevated. Recommended follow-up with gastroenterology if symptoms do not improve after 3 months of twice daily PPI. Offered referral to speech-language pathology due to continued hoarseness and frequent throat clearing, patient was agreeable, order placed today."   ENT 10/07/23 "Chronic Cough and Throat Clearing Chronic cough and throat clearing, likely  multifactorial including post-nasal drip, chronic GERD, and vocal fold atrophy. Symptoms exacerbated by interstitial lung disease and nasal cannula O2 requirement. Examination revealed pooling of secretions in pyriform sinuses concerning for dysphagia. 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  Discussed voice therapy as first-line therapy. Reviewed potential benefits and risks of current medications and new treatments such as steroid nasal rinses and switching antihistamines. - Order swallow study to evaluate for dysphagia (MBS esophagram) - Refer to speech therapy for voice therapy for vocal fold atrophy glottic insufficiency - Continue Azelastine for chronic nasal congestion - Switch from Claritin to Zyrtec - Add budesonide or mometasone to nasal rinses - Use Vaseline to prevent nasal dryness - Continue omeprazole and reflux raft, especially at night - Continue Delsym and Mucinex as needed for cough   Chronic Nasal Congestion Post-Nasal Drip Post-nasal drip contributing to chronic cough and throat clearing. Examination showed clear drainage in the back of the nose and nasal irritation likely due to allergies and O2 supplementation via nasal cannula. Discussed benefits of saline nasal rinses and potential for steroid nasal rinses to cover a larger surface area. Informed about slight risk of epistaxis with oxygen therapy and importance of using Vaseline to prevent nasal dryness. - Continue saline nasal rinses - Add budesonide or mometasone to nasal rinses - Switch from Claritin to Zyrtec - Continue azelastine 2 puffs b/l nares twice daily - Use Vaseline to prevent nasal dryness   Chronic Gastroesophageal Reflux Disease (GERD) GERD contributing to chronic throat clearing and cough.  Discussed importance of taking omeprazole and reflux raft, especially at night, and potential benefits of using reflux raft after large meals during the day. 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 Examination revealed age-appropriate thinning of the vocal folds but vocal folds were mobile without masses or lesions.  - Refer to speech therapy for voice therapy/"    PATIENT REPORTED OUTCOME MEASURES (PROM): Communication Participation Item Bank: 9 "quite a bit" for all situations  but "very much" for getting turn in fast moving conversation  TODAY'S TREATMENT:         12/15/23:  Pt entered with portable oxygen (5L). Reported illness over weekend exacerbating SOB. Independently implemented strategy to alternate conversation to listener to focus on regaining breath support. Able to optimize upper body relaxation and reduce SOB using diaphragmatic breathing given occasional cues. Re-educated recommendations for cough suppression and throat clear alternatives as intermittent coughing observed causing overt tension and SOB. Occasional cues required to relax and use pursed lip breathing technique. Suspect chronic cough and hypersensitivity in larynx contributing to MTD. Once relaxed and utilizing diaphragmatic breathing, clear voicing in discourse achieved. Provided additional recommendations to aid breath support at home (see pt instructions).   12/01/23: Entered with portable oxygen. O2 level at 94% after walking in, pt on 2 liters of oxygen this morning. SLP noted evident improvements in pt's voice and breath support today compared  to last sessions. Pt also reports completion of circum-laryngeal exercises at home, confirms they have been helpful for reducing tension. ST reintroduced flow phonation exercises targeting breath support. Pt participated effectively given modeling, mod-A, and intermittent breaks for smooth, intentional diaphragmatic breathing.  ST initiated [employer] [facility] ([facility]) targeting clear, intentional voice in functional conversation. Pt required occasional verbal cues to consistently project a clear, strong voice. ST plans to continue flow phonation and functional conversation training interventions in future sessions.  11/17/23: Enters with portable oxygen. Doffed when O2 at comfortable level. Consuming Reflux Raft as recommended, with reported benefit. ST provided strategies to aid consistent administration of PPI before breakfast. Recent MBSS was [facility]. Reported overall mild improvement in breathing and voice since last session. Re-educated recommended voice techniques from prior sessions, including focusing on "forward focused speech"/forward focused resonance. ST encouraged upright posture and whole body relaxation. Introduced and modeled circumlaryngeal massage x1 and diaphragmatic breathing to target relaxation of muscles surrounding the larynx and improved breath support. ST then introduced flow phonation targeting optimized airflow and reduced laryngeal tension. Pt participated effectively, experienced some difficulty relaxing, noted "holding breath" in between inhale and exhale. Pt performance improved with prompting, modeling and and mod-A. ST plans to facilitate practice with flow phonation exercises in future sessions. Increased but inconsistent clear vocal quality noted at end of session.  11/03/23: Entered with portable oxygen (pulse; 3 L). Wife, Zella Ball, present this session. Desaturated to 87% following short walk to SLP office. Steadily rose to 91% and 98% given rest break. Voice c/b hoarse, strained quality with dysfluent rate associated with impaired breath support. Did mildly improve throughout session; however, limited maintenance noted d/t impaired breath support. Continues to complain of "throat congestion" being main barrier to improved voicing. Dicussed previously recommended vocal hygiene practices, including  reduction of throat clearing, increased hydration to thin mucous, and strict adherence to reflux recommendations. Inconsistent but increasing carryover of recommendations reported over last week, with benefit. 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 Scheduled for MBSS in two days per patient report of dysphagia to ENT. Will review documentation and recommendations and modify POC accordingly. May benefit from FEES d/t pt complaint of pharyngeal congestion. Recert completed d/t decline in speech and recent concern for dysphagia.   10-08-23: Overt SOB and hoarseness exhibited this session, which was notably worse than last session. Recommended f/u with pulmonologist d/t persistent difficulty breathing impacting voice and overall functioning. Recently saw ENT and pulmonary rehab. Re-educated ENT results and recommendations, including being increasingly intentional with reducing throat clearing and taking reflux raft for reflux management. Suggested gargling with water after inhaler use d/t SLP research re: dysphonia and inhalers. Plan to initiate new interventions, circumlaryngeal message and PhoRTE, next session pending pt respiratory baseline to address newly diagnosed vocal fold atrophy and supraglottic compression.   09-24-23: Visited ED on 09/12/23 d/t shortness of breath. Began course of antibiotics for suspected bronchitis/pneumonia and symptoms have resolved. Despite recent respiratory challenges, pt reports wanting to continue IMST exercises as he has been completing daily HEP and feels it is significantly improving respiratory strength. Additionally, a friend provided positive affirmation of voice strength and quality over 2 hour conversation. In previous session, SLP set device to 26.25 cm H20 based on MIP calculation and instructed pt not to adjust it, however the device was set to  33 cm H20 when pt entered. Reassessed MIP today across  3 trials: 80, 58, 75. Adjusted IMST device to 75% of MIP (80): 60 cm H20 (SLP set to max setting: 41 cm H20). Pt demo'd 25 reps with rare min A. Reviewed reflux precautions as pt endorses increased globus sensation/congestion in throat. Pt would like f/u with ENT for second opinion; requested today. Recommended inquiring about pulmonary rehab at upcoming appointment d/t success with IMST thus far. Re-assess MIP next session.   09-08-23: Continues to c/o significant asthma impacting respiratory function and subsequently voice. Initiated education and instruction of Administrator, arts (IMST) to address impaired breath support impacting vocal intensity and clarity. Measured MIP across 3 trials: 35, 29, 17. Modified provided IMST device to 75% of MIP (35): 26.25 cm H2O. Instructed patient on how to use device and perform recommended exercises (see pt instructions for HEP). Pt able to demo 25 reps with occasional min A. Current device setting was deemed appropriate. Re-assess MIP next session.   08-26-23: Reports feeling under the weather during the past week and experiencing congestion from asthma today. SLP led discussion about preparing for work meetings occurring after ST. Recommended taking 5 to 10 minutes before meetings to reset breathing and prepare for prolonged speech. Pt identified x2 strategies to focus on during meeting with mod I. In 30 minute unstructured conversation, pt experienced occasional wet vocal quality, likely due to congestion, but independently self-corrected. Otherwise maintained strong breath support and clear vocal quality with rare min A and x3 cough/throat clears. Pt endorses overall improvements in conversational speech, but wants to continue targeting respiratory support.   08-12-23: Reports ongoing improvements in voice as well as breathing with less frequent cues provided by wife to use strategies. Continue  to target use of diaphragmatic breathing, intentional upper body relaxation, and forward projection of voice in dual tasking scenarios. Pt aware of increased tension and reduced abdominal breathing during transition periods that include dividing attention between talking and other task (walking, answering phone). Focused on being intentional with relaxation and breath support to improve vocal quality and intensity during targeted dual tasking activities. Occasional min cues required to reduce tension, optimize breathing pattern, and project voice during these tasks.   08-06-23: Entered with hoarse vocal quality that improved independently, with pt stating that he focused on using forward resonance. Maintained relaxed appearance, strong breath support, and clear vocal quality during 20-minute unstructured conversation. SLP directed pt to walk down the hall and return to therapy room to read a short passage x2 to target increased breath support and clear vocal quality in speech following exertion. Successfully completed task with occasional fading to rare min A, with improved breath support and vocal quality noted in the 2nd attempt. Benefited from cues to reset before beginning to read and take intentional pauses between sentences. Pt self-rated first attempt a 7.5 and second attempt an 8.5-9 out of 10. Addressed breath support during exertion by completing walking and talking task. Pt maintained structured conversation while completing laps around therapy gym with occasional mod fading to min A to take breaths in between sentences and stay relaxed. Increased success noted in additional trial.   07-29-23: Reported ongoing HEP completion. Endorsed overall improvement of SOVT exercises, although some days are better than others. Targeted SOVTE to reduce vocal tension and improve clarity. Completed sustained exhale, hum, and glides into water with occasional min A to reduce upper body tension and take reset breaths.  Introduced straw talking exercise with short phrases to improve forward resonance. Used negative practice to alternate between  back-focused voice and forward resonance to improve awareness of vocal quality. Accurately read aloud a list of 8-9 word sentences using forward resonance with occasional min A. Back-focused voice more apparent at the end of sentences. Benefited from cues to break up long sentences with a pause and reset with a straw or hum.  07-23-23: Entered with clear vocal quality and reported completing SOVT HEP intermittently. Targeted SOVT exercises to reduce tension in vocal folds and improve vocal quality. Aware he can sustain exhale longer than before during exercises (averaged 6 sec for breath only). Completed hum, accents, and happy birthday into water given occasional fading to rare min A to relax upper body tension and increase breath support. Maintained 15 minute conversation with clear vocal quality and minimal upper body tension independently. Pt endorsed increasing awareness of body tension in the community; however, his wife occasionally cues him to relax which causes frustration. SLP counseled pt that the strategies he is learning will take time to master in the community. No coughing or throat clearing observed throughout session.    PATIENT EDUCATION: Education details: see above  Person educated: Patient and Spouse Education method: Medical illustrator Education comprehension: verbalized understanding, returned demonstration, and needs further education  HOME EXERCISE PROGRAM: Abdominal breathing, forward resonance  GOALS: Goals reviewed with patient? Yes  SHORT TERM GOALS: Target date: 06/23/2023 (12/01/2023 for recert)  Pt will reduce throat clearing and coughing by 50% at STG date using trained techniques Baseline: > 9, 6 respectively; 1/0 at STG date Goal status: MET  2.  Pt will employ abdominal breathing during structured tasks with 80% accuracy given  occasional min A  Baseline:  Goal status: MET  3.  Pt will employ abdominal breathing during structured conversation with 80% accuracy given occasional min A Baseline:  Goal status: MET  4.  Pt will maintain clear vocal quality in 5-10 minute structured conversations x2 given occasional min A  Baseline:  Goal status: MET  5.  Pt will carryover recommendations and complete HEP as recommended x3 sessions  Baseline:  Goal status: PARTIALLY MET  6.  Pt will achieve clear vocal clarity with use of trained techniques in short, structured conversations 3-5 mins x2 sessions  Baseline:  12-01-23, 12-15-23 Goal status: MET  7.  Pt will demonstrate improved swallow function with recommended strategies and/or exercises given rare min A x2 sessions Baseline:  Goal status: DEFERRED    LONG TERM GOALS: Target date: 07/21/2023 (12/29/2023 for recert)   Pt will reduce throat clearing and coughing by 75% at LTG date using trained techniques Baseline:  Goal status: MET  2.  Pt will employ abdominal breathing during unstructured conversation with 80% accuracy given rare min A Baseline:  Goal status: MET  3.  Pt will maintain clear vocal quality in 15+ minute unstructured conversations x2 given rare min A  Baseline: 07-23-23, 08-06-23 Goal status: MET   4.  Pt will carryover trained voice techniques during community outings x2 with less frequent cues required by wife  Baseline: 08-12-23, 09-24-23 Goal status: MET  5.  Pt will complete IMST HEP with rare min A resulting in increased MIP at 4 weeks Baseline: MIP=26.25 Goal status: MET  6.  Pt will achieve clear vocal clarity with use of trained techniques in unstructured conversations 10+ minutes x2 session  Baseline:  Goal status: ONGOING (at recert)  7.  Pt will report improved communication effectiveness via PROM by 2 pts at last ST session  Baseline: CPIB=9 Goal status: IN  PROGRESS (at recert)  8.  Pt will report improved swallow  function with recommended strategies and/or exercises at home with mod I Baseline:  Goal status: ONGOING (at recert)  ASSESSMENT:  CLINICAL IMPRESSION: Patient is a 68 y.o. M who was seen today for dysphonia. PMHX significant for Multiple Myeloma, interstitial lung disease, and GERD. MBSS displayed no cause for concern and sufficient swallow. ENT recommended FEES. Due to dx of VF atrophy and supraglottic compression, pt continues to benefit from additional education and training of additional voice exercises to optimize vocal quality. Pt continuing to benefit from skilled ST intervention, such as training vocal hygiene protocol and voice techniques to optimize vocal quality and communication effectiveness.   OBJECTIVE IMPAIRMENTS: include voice disorder. These impairments are limiting patient from effectively communicating at home and in community. Factors affecting potential to achieve goals and functional outcome are co-morbidities. Patient will benefit from skilled SLP services to address above impairments and improve overall function.  REHAB POTENTIAL: Good  PLAN:  SLP FREQUENCY: 1-2x/week (pt elects for every other week)  SLP DURATION: 8 weeks (+ 8 additional weeks for recert)  PLANNED INTERVENTIONS: Language facilitation, Environmental controls, Cueing hierachy, Internal/external aids, Functional tasks, Multimodal communication approach, SLP instruction and feedback, Compensatory strategies, Patient/family education, and Re-evaluation    Gracy Racer, CCC-SLP 12/15/2023, 3:02 PM

## 2023-12-16 ENCOUNTER — Encounter (HOSPITAL_COMMUNITY)
Admission: RE | Admit: 2023-12-16 | Discharge: 2023-12-16 | Disposition: A | Discharge status: Home / Self Care | Payer: Medicare Other | Source: Ambulatory Visit | Attending: Internal Medicine

## 2023-12-16 DIAGNOSIS — J849 Interstitial pulmonary disease, unspecified: Secondary | ICD-10-CM | POA: Diagnosis not present

## 2023-12-16 NOTE — Progress Notes (Signed)
 Daily Session Note  Patient Details  Name: Samuel [name] MRN: [id number] Date of Birth: 1956/08/04 Referring Provider:   Doristine Devoid Pulmonary Rehab Walk Test from 10/06/2023 in [facility]  Referring Provider Ramaswamy       Encounter Date: 12/16/2023  Check In:  Session Check In - 12/16/23 4098       Check-In   Supervising physician immediately available to respond to emergencies [facility] MD immediately available    Physician(s) Robin Searing, NP    Location [facility]    Staff Present Raford Pitcher, MS, ACSM-CEP, Exercise Physiologist;Adrijana Haros Katrinka Blazing, RT    Virtual Visit No    Medication changes reported     No    Fall or balance concerns reported    No    Tobacco Cessation No Change    Warm-up and Cool-down Performed as group-led instruction    Resistance Training Performed Yes    VAD Patient? No    PAD/SET Patient? No      Pain Assessment   Currently in Pain? No/denies    Multiple Pain Sites No             Capillary Blood Glucose: No results found for this or any previous visit (from the past 24 hours).    Social History   Tobacco Use  Smoking Status Former   Current packs/day: 0.00   Average packs/day: 0.1 packs/day for 3.0 years (0.3 ttl pk-yrs)   Types: Cigarettes   Start date: 10/22/1979   Quit date: 10/21/1982   Years since quitting: 41.1  Smokeless Tobacco Never    Goals Met:  Proper associated with RPD/PD & O2 Sat Independence with exercise equipment Exercise tolerated well No report of concerns or symptoms today Strength training completed today  Goals Unmet:  Not Applicable  Comments: Service time is from 0811 to 0935.    Dr. Mechele Collin is Medical Director for Pulmonary Rehab at [facility].

## 2023-12-18 ENCOUNTER — Encounter (HOSPITAL_COMMUNITY)
Admission: RE | Admit: 2023-12-18 | Discharge: 2023-12-18 | Disposition: A | Discharge status: Home / Self Care | Payer: Medicare Other | Source: Ambulatory Visit | Attending: Internal Medicine

## 2023-12-18 DIAGNOSIS — J849 Interstitial pulmonary disease, unspecified: Secondary | ICD-10-CM

## 2023-12-18 NOTE — Progress Notes (Signed)
 Daily Session Note  Patient Details  Name: FINNEAN CERAMI MRN: [id number] Date of Birth: 1955/12/28 Referring Provider:   Doristine Devoid Pulmonary Rehab Walk Test from 10/06/2023 in [facility]  Referring Provider Ramaswamy       Encounter Date: 12/18/2023  Check In:  Session Check In - 12/18/23 1308       Check-In   Supervising physician immediately available to respond to emergencies [facility] MD immediately available    Physician(s) Bernadene Person, NP    [facility]    Staff Present Raford Pitcher, MS, ACSM-CEP, Exercise Physiologist;Casey Synthia Innocent, RN, BSN;Yorel Redder BS, ACSM-CEP, Exercise Physiologist;Samantha Belarus, RD, LDN    Virtual Visit No    Medication changes reported     No    Fall or balance concerns reported    No    Tobacco Cessation No Change    Warm-up and Cool-down Performed as group-led instruction    Resistance Training Performed Yes    VAD Patient? No    PAD/SET Patient? No      Pain Assessment   Currently in Pain? No/denies             Capillary Blood Glucose: No results found for this or any previous visit (from the past 24 hours).    Social History   Tobacco Use  Smoking Status Former   Current packs/day: 0.00   Average packs/day: 0.1 packs/day for 3.0 years (0.3 ttl pk-yrs)   Types: Cigarettes   Start date: 10/22/1979   Quit date: 10/21/1982   Years since quitting: 41.1  Smokeless Tobacco Never    Goals Met:  Exercise tolerated well No report of concerns or symptoms today Strength training completed today  Goals Unmet:  Not Applicable  Comments: Service time is from 0815 to 0930.    Dr. Mechele Collin is Medical Director for Pulmonary Rehab at [facility].

## 2023-12-19 ENCOUNTER — Telehealth: Payer: Self-pay | Admitting: Adult Health

## 2023-12-19 NOTE — Telephone Encounter (Signed)
 [employer] needs a copy of the patient's walk test. Fax number [phone] Attention: [employer]

## 2023-12-19 NOTE — Telephone Encounter (Signed)
 How can I print the 6 min walk? When I tried to look, it set it up as if the pt was here doing the walk now.

## 2023-12-22 ENCOUNTER — Telehealth (INDEPENDENT_AMBULATORY_CARE_PROVIDER_SITE_OTHER): Payer: Self-pay

## 2023-12-22 NOTE — Telephone Encounter (Signed)
 I do not see a recent six minute walk test.  PCC's, will the walk test in the [facility] note suffice?

## 2023-12-22 NOTE — Telephone Encounter (Signed)
 I called [employer] and left a message for confirmation. I also sent a message to [employer] through [employer] for confirmation. I will provide an update once I receive a response.

## 2023-12-22 NOTE — Telephone Encounter (Signed)
 Spoke with the patient, there was some confusion as to his SLP, I explained that was the speech therapy he has been doing, he asked about another procedure that was to be done. I looked at Dr Margarito Liner last office note and it mentioned a VF injection if symptoms had not improved with therapy, the patient understood, I also confirmed his April appointment.

## 2023-12-23 ENCOUNTER — Encounter (HOSPITAL_COMMUNITY)
Admission: RE | Admit: 2023-12-23 | Discharge: 2023-12-23 | Disposition: A | Discharge status: Home / Self Care | Payer: Medicare Other | Source: Ambulatory Visit | Attending: Internal Medicine | Admitting: Internal Medicine

## 2023-12-23 VITALS — Wt 204.4 lb

## 2023-12-23 DIAGNOSIS — J849 Interstitial pulmonary disease, unspecified: Secondary | ICD-10-CM | POA: Diagnosis present

## 2023-12-23 NOTE — Progress Notes (Signed)
 Daily Session Note  Patient Details  Name: Samuel [name] MRN: [id number] Date of Birth: Mar 06, 1956 Referring Provider:   Doristine Devoid Pulmonary Rehab Walk Test from 10/06/2023 in [facility]  Referring Provider Ramaswamy       Encounter Date: 12/23/2023  Check In:  Session Check In - 12/23/23 4098       Check-In   Supervising physician immediately available to respond to emergencies [facility] MD immediately available    Physician(s) Bernadene Person, NP    [facility]    Staff Present Raford Pitcher, MS, ACSM-CEP, Exercise Physiologist;Kaitlyn Skowron Synthia Innocent, RN, BSN;Randi Reeve BS, ACSM-CEP, Exercise Physiologist;Samantha Belarus, RD, LDN    Virtual Visit No    Medication changes reported     No    Fall or balance concerns reported    No    Tobacco Cessation No Change    Warm-up and Cool-down Performed as group-led instruction    Resistance Training Performed Yes    VAD Patient? No    PAD/SET Patient? No      Pain Assessment   Currently in Pain? No/denies    Multiple Pain Sites No             Capillary Blood Glucose: No results found for this or any previous visit (from the past 24 hours).   Exercise Prescription Changes - 12/23/23 0900       Response to Exercise   Blood Pressure (Admit) 124/78    Blood Pressure (Exercise) 150/78    Blood Pressure (Exit) 126/66    Heart Rate (Admit) 59 bpm    Heart Rate (Exercise) 72 bpm    Heart Rate (Exit) 63 bpm    Oxygen Saturation (Admit) 100 %    Oxygen Saturation (Exercise) 95 %    Oxygen Saturation (Exit) 98 %    Rating of Perceived Exertion (Exercise) 11    Perceived Dyspnea (Exercise) 1    Duration Continue with 30 min of aerobic exercise without signs/symptoms of physical distress.    Intensity THRR unchanged      Progression   Progression Continue to progress workloads to maintain intensity without signs/symptoms of physical  distress.      Resistance Training   Training Prescription Yes    Weight blue bands    Reps 10-15    Time 10 Minutes      Oxygen   Oxygen Continuous    Liters 3-10      Treadmill   MPH 1.7    Grade 1    Minutes 15    METs 2.4      Recumbant Elliptical   Level 4    Minutes 15    METs 2.4      Oxygen   Maintain Oxygen Saturation 88% or higher             Social History   Tobacco Use  Smoking Status Former   Current packs/day: 0.00   Average packs/day: 0.1 packs/day for 3.0 years (0.3 ttl pk-yrs)   Types: Cigarettes   Start date: 10/22/1979   Quit date: 10/21/1982   Years since quitting: 41.2  Smokeless Tobacco Never    Goals Met:  Proper associated with RPD/PD & O2 Sat Independence with exercise equipment Exercise tolerated well No report of concerns or symptoms today Strength training completed today  Goals Unmet:  Not Applicable  Comments: Service time is from 0814 to [phone].    Dr.  Mechele Collin is Medical Director for Pulmonary Rehab at [facility].

## 2023-12-24 ENCOUNTER — Ambulatory Visit
Admission: RE | Admit: 2023-12-24 | Discharge: 2023-12-24 | Disposition: A | Discharge status: Home / Self Care | Payer: Medicare Other | Source: Ambulatory Visit | Attending: Internal Medicine | Admitting: Internal Medicine

## 2023-12-24 DIAGNOSIS — J849 Interstitial pulmonary disease, unspecified: Secondary | ICD-10-CM

## 2023-12-24 NOTE — Telephone Encounter (Addendum)
 Samuel [name] is going to send the [facility] note over to another department within [employer] to confirm if this will suffice. She has my direct number and will contact me to let me know the results.

## 2023-12-25 ENCOUNTER — Encounter (HOSPITAL_COMMUNITY)
Admission: RE | Admit: 2023-12-25 | Discharge: 2023-12-25 | Disposition: A | Discharge status: Home / Self Care | Payer: Medicare Other | Source: Ambulatory Visit | Attending: Internal Medicine | Admitting: Internal Medicine

## 2023-12-25 DIAGNOSIS — J849 Interstitial pulmonary disease, unspecified: Secondary | ICD-10-CM

## 2023-12-25 NOTE — Progress Notes (Signed)
 Daily Session Note  Patient Details  Name: Samuel [name] MRN: [id number] Date of Birth: 09-24-1956 Referring Provider:   Doristine Devoid Pulmonary Rehab Walk Test from 10/06/2023 in [facility]  Referring Provider Ramaswamy       Encounter Date: 12/25/2023  Check In:  Session Check In - 12/25/23 0835       Check-In   Supervising physician immediately available to respond to emergencies [facility] MD immediately available    Physician(s) Robin Searing, NP    [facility]    Staff Present Raford Pitcher, MS, ACSM-CEP, Exercise Physiologist;Armida Vickroy Synthia Innocent, RN, BSN;Randi Reeve BS, ACSM-CEP, Exercise Physiologist    Virtual Visit No    Medication changes reported     No    Fall or balance concerns reported    No    Tobacco Cessation No Change    Warm-up and Cool-down Performed as group-led instruction    Resistance Training Performed Yes    VAD Patient? No    PAD/SET Patient? No      Pain Assessment   Currently in Pain? No/denies             Capillary Blood Glucose: No results found for this or any previous visit (from the past 24 hours).    Social History   Tobacco Use  Smoking Status Former   Current packs/day: 0.00   Average packs/day: 0.1 packs/day for 3.0 years (0.3 ttl pk-yrs)   Types: Cigarettes   Start date: 10/22/1979   Quit date: 10/21/1982   Years since quitting: 41.2  Smokeless Tobacco Never    Goals Met:  Proper associated with RPD/PD & O2 Sat Independence with exercise equipment Exercise tolerated well No report of concerns or symptoms today Strength training completed today  Goals Unmet:  Not Applicable  Comments: Service time is from 0816 to 0936.    Dr. Mechele Collin is Medical Director for Pulmonary Rehab at [facility].

## 2023-12-26 NOTE — Telephone Encounter (Signed)
 I received confirmation from [facility] regarding whether documenting the stats/6-minute walk test information in the Patient Care Coordinator Note would be approved by billing. The billing department stated that this would not be acceptable. It's suggested to have the information on the [facility] note on the progress notes.

## 2023-12-29 ENCOUNTER — Ambulatory Visit: Payer: Medicare Other | Attending: Family Medicine

## 2023-12-29 DIAGNOSIS — R059 Cough, unspecified: Secondary | ICD-10-CM | POA: Diagnosis present

## 2023-12-29 DIAGNOSIS — R498 Other voice and resonance disorders: Secondary | ICD-10-CM | POA: Diagnosis present

## 2023-12-29 NOTE — Telephone Encounter (Signed)
 What is needed- is this a DME /Insurance O[employer] qualification . Why did he need a 6 min walk test?  He is coming into the office on 01/13/24 please verify what is needed so it can be documented  at this visit.

## 2023-12-29 NOTE — Patient Instructions (Addendum)
 Speech strategies:  Relax shoulders If your body or voice is tense, breathe out through pursed lips Break up speech pattern into shorter phrases Do not whisper or press our your voice  Be clear & project your voice!

## 2023-12-29 NOTE — Therapy (Signed)
 OUTPATIENT SPEECH LANGUAGE PATHOLOGY VOICE TREATMENT (DISCHARGE)   Patient Name: Samuel [name] MRN: [id number] DOB:17-Apr-1956, 68 y.o., male Today's Date: 12/29/2023  PCP: Tally Joe, MD REFERRING PROVIDER: Tally Joe, MD  END OF SESSION:  End of Session - 12/29/23 0853     Visit Number 25    Number of Visits 30    Date for SLP Re-Evaluation 12/29/23    Authorization Type medicare/ BC[employer]    SLP Start Time [phone]    SLP Stop Time  0930    SLP Time Calculation (min) 37 min    Activity Tolerance Patient tolerated treatment well            SPEECH THERAPY DISCHARGE SUMMARY  Visits from Start of Care: 25  Current functional level related to goals / functional outcomes: Presents with overall improvements in vocal quality and breath support with extensive education and training of voice exercises and techniques since beginning ST in August 2024. Suspect ongoing intermittent fluctuations in voice and breath support secondary to ILD.   Remaining deficits: ILD, reduced breath support    Education / Equipment: Cough suppression, throat clear alternatives, SOVTE, resonant voice, circumlaryngeal massage, IMST  Patient agrees to discharge. Patient goals were met. Patient is being discharged due to maximized rehab potential.     Past Medical History:  Diagnosis Date   Asthma    GERD (gastroesophageal reflux disease)    High cholesterol    under control.    History of blood transfusion    Hypothyroidism    Interstitial lung disease (HCC)    Multiple myeloma (HCC)    Perennial allergic rhinitis    Pneumonia    Sciatic pain, right    Seasonal allergic rhinitis    Thyroid disease    Past Surgical History:  Procedure Laterality Date   BRONCHIAL BIOPSY  06/18/2021   Procedure: BRONCHIAL BIOPSIES;  Surgeon: Leslye Peer, MD;  Location: [facility];  Service: Cardiopulmonary;;   BRONCHIAL BIOPSY  08/28/2021   Procedure: BRONCHIAL BIOPSIES;  Surgeon: Josephine Igo,  DO;  Location: [facility];  Service: Cardiopulmonary;;   BRONCHIAL WASHINGS  06/18/2021   Procedure: BRONCHIAL WASHINGS;  Surgeon: Leslye Peer, MD;  Location: [facility];  Service: Cardiopulmonary;;   BRONCHIAL WASHINGS  08/28/2021   Procedure: BRONCHIAL WASHINGS;  Surgeon: Josephine Igo, DO;  Location: [facility];  Service: Cardiopulmonary;;   NASAL SINUS SURGERY     NECK SURGERY  1996   VIDEO BRONCHOSCOPY N/A 06/18/2021   Procedure: VIDEO BRONCHOSCOPY WITH FLUORO;  Surgeon: Leslye Peer, MD;  Location: [facility];  Service: Cardiopulmonary;  Laterality: N/A;   VIDEO BRONCHOSCOPY N/A 08/28/2021   Procedure: VIDEO BRONCHOSCOPY WITH FLUORO;  Surgeon: Josephine Igo, DO;  Location: [facility];  Service: Cardiopulmonary;  Laterality: N/A;   Patient Active Problem List   Diagnosis Date Noted   Essential hypertension 07/04/2023   Coronary artery disease 11/28/2022   Mixed hyperlipidemia 11/28/2022   Recurrent sinusitis 11/04/2022   Acute bacterial rhinosinusitis 09/26/2022   Chronic respiratory failure with hypoxia (HCC) 08/01/2022   S/P bronchoscopy    ILD (interstitial lung disease) (HCC)    Preop cardiovascular exam 08/21/2021   Pneumonitis 07/20/2021   Anxiety 07/20/2021   Interstitial pulmonary disease (HCC) 07/20/2021   Abnormal CT of the chest 06/18/2021   Dyspnea 06/15/2021   Hypoxia 06/15/2021   Multiple myeloma (HCC) 03/05/2021   Multiple myeloma not having achieved remission (HCC) 03/05/2021   Bone lesion 02/14/2021   Monoclonal gammopathy  02/14/2021   High cholesterol    Allergic rhinitis 11/23/2020   Mild persistent asthma 11/23/2020   SOB (shortness of breath) 10/04/2011    Onset date: 05/15/23 (referral date)  REFERRING DIAG: R49.0 (ICD-10-CM) - Dysphonia  THERAPY DIAG: Other voice and resonance disorders  Cough, unspecified type  Rationale for Evaluation and Treatment: Rehabilitation  SUBJECTIVE:   SUBJECTIVE STATEMENT: Endorsed running  behind due to time change resulting in feeling rushed. SOB observed despite supplemental oxygen. Cued strategies to optimize breath support and relax upper body upon arrival to ST to aid vocal quality.  Accompanied by: self  PERTINENT HISTORY: "Samuel [name] is a 68 y.o. male who presents as a return patient, for follow-up of sinusitis and globus sensation. At time of last visit on 03/14/2023, nasal culture was obtained following thick mucoid drainage noted in the right nasal cavity. This was abnormal, and demonstrated gram-positive bacilli. Patient was subsequently treated with a 2-week course of oral antibiotics, and presents today for posttreatment imaging. He has continued to use proton pump inhibitor twice daily, but continues to experience frequent throat clearing and globus sensation. He states he has been working on dietary and lifestyle modifications as previously instructed, and has been partially successful. He still has difficulty with frequent throat clearing and needing to cough often. He also has not been able to obtain humidification for his oxygen, as the DME company stated they needed a prescription.  Last visit 03/14/2023: Samuel [name] is a 68 y.o. male who presents as a new consult, referred by [name], FNP , for evaluation and treatment of sinus issues, hoarseness, and throat symptoms. He is accompanied by his wife.  Patient had history of multiple myeloma, status post treatment with chemotherapy. He developed interstitial lung disease following chemotherapeutic treatment, and is currently followed closely by pulmonology. He also endorses history of significant environmental allergies, and reports that recent allergy testing showed positivity to "everything". He was previously on immunotherapy, but this was held during his cancer treatment. He is considering starting back on the chemotherapy due to the significance of his symptoms. He is currently using Singulair,  antihistamine and Patanase nasal spray on a daily basis. He also reports using nasal saline irrigations on a daily basis. He reports history of deviated nasal septum repair in 1983. He denies history of sinusitis requiring treatment with oral antibiotics in the last 12 months. His primary symptoms are nasal congestion and postnasal drainage. He denies facial pressure, pain, hyposmia, hypogeusia. He has not had any imaging of his head or sinuses. He does occasionally experience headaches. He uses oxygen on a nightly basis, and states that his oxygen is currently not humidified.  He reports symptoms of silent reflux, and states he is currently taking omeprazole on a daily basis. [name] is frequent throat clearing and globus sensation. He also reports occasional hoarseness."  PAIN: Are you having pain? No  FALLS: Has patient fallen in last 6 months? No  LIVING ENVIRONMENT: Lives with: lives with their family Lives in: House/apartment  PLOF:Level of assistance: Independent with ADLs, Independent with IADLs Employment: Full-time employment  PATIENT GOALS: "control my breath, control my speech, and relaxation"   OBJECTIVE:   DIAGNOSTIC FINDINGS: MBSS 11/05/23:  Pt's oropharyngeal swallow is functional with adequate efficiency and safety. He has trace amounts of residue in his pyriform sinuses after the swallow, noted more with liquids than with solids. He does not have a lot of anterior hyoid excursion but he has no other overt pharyngeal deficits. No  penetration or aspiration observed. Note that pt did report feeling like the purees and solids got stuck in his throat, which elicited coughing and throat clearing in an attempt to clear. When correlated with imaging, there was no residual in his pharynx. Question a referrred sensation and primary esophageal component (pt having esophagram after this study). Would continue regular solids and thin liquids with use of esophageal precautions. Would defer f/u  to primary OP SLP.  ENT 04/04/23:  "Nasolaryngoscopy performed at last visit demonstrated moderate interarytenoid edema with otherwise normal upper airway exam. Counseled patient to continue twice daily omeprazole for next 2-3 months. We discussed dietary lifestyle modifications for treatment of reflux to include avoiding spicy and acidic foods which exacerbate reflux, avoiding late meals and snacking, avoiding caffeine and stimulants, avoiding carbonated and alcoholic beverages, adequate oral hydration, avoidance of throat clearing and sleeping with the head of bed elevated. Recommended follow-up with gastroenterology if symptoms do not improve after 3 months of twice daily PPI. Offered referral to speech-language pathology due to continued hoarseness and frequent throat clearing, patient was agreeable, order placed today."   ENT 10/07/23 "Chronic Cough and Throat Clearing Chronic cough and throat clearing, likely multifactorial including post-nasal drip, chronic GERD, and vocal fold atrophy. Symptoms exacerbated by interstitial lung disease and nasal cannula O2 requirement. Examination revealed pooling of secretions in pyriform sinuses concerning for dysphagia. 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  Discussed voice therapy as first-line therapy. Reviewed potential benefits and risks of current medications and new treatments such as steroid nasal rinses and switching antihistamines. - Order swallow study to evaluate for dysphagia (MBS esophagram) - Refer to speech therapy for voice therapy for vocal fold atrophy glottic insufficiency - Continue Azelastine for chronic nasal congestion - Switch from Claritin to Zyrtec - Add budesonide or mometasone to nasal rinses - Use Vaseline to prevent nasal dryness - Continue omeprazole and reflux raft, especially at night - Continue Delsym and Mucinex as needed for cough   Chronic Nasal Congestion Post-Nasal Drip Post-nasal  drip contributing to chronic cough and throat clearing. Examination showed clear drainage in the back of the nose and nasal irritation likely due to allergies and O2 supplementation via nasal cannula. Discussed benefits of saline nasal rinses and potential for steroid nasal rinses to cover a larger surface area. Informed about slight risk of epistaxis with oxygen therapy and importance of using Vaseline to prevent nasal dryness. - Continue saline nasal rinses - Add budesonide or mometasone to nasal rinses - Switch from Claritin to Zyrtec - Continue azelastine 2 puffs b/l nares twice daily - Use Vaseline to prevent nasal dryness   Chronic Gastroesophageal Reflux Disease (GERD) GERD contributing to chronic throat clearing and cough.  Discussed importance of taking omeprazole and reflux raft, especially at night, and potential benefits of using reflux raft after large meals during the day. 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 Examination revealed age-appropriate thinning of the vocal folds but vocal folds were mobile without masses or lesions.  - Refer to speech therapy for voice therapy/"    PATIENT REPORTED OUTCOME MEASURES (PROM): Communication Participation Item Bank: 9 "quite a bit" for all situations  but "very much" for getting turn in fast moving conversation  TODAY'S TREATMENT:  12/29/23: Entered with reduced breath support following exertion. Recommended intentional focus on abdominal breathing and upper body relaxation to optimize breath support. Intermittent verbal and visual cues provided for relaxation and cough suppression techniques in light of strained voice and occasional tense coughing. Improved clarity and intensity of voice exhibited  with cued clear speech, projection, and chunking strategy for shorter phrases. Provided handout with recommendations. Discharge completed today as pt able to teach back recommended strategies and demonstrate with varied cues related to severity of breathing status. Pt is agreeable to ST discharge today as education and training completed.   12/15/23:  Pt entered with portable oxygen (5L). Reported illness over weekend exacerbating SOB. Independently implemented strategy to alternate conversation to listener to focus on regaining breath support. Able to optimize upper body relaxation and reduce SOB using diaphragmatic breathing given occasional cues. Re-educated recommendations for cough suppression and throat clear alternatives as intermittent coughing observed causing overt tension and SOB. Occasional cues required to relax and use pursed lip breathing technique. Suspect chronic cough and hypersensitivity in larynx contributing to MTD. Once relaxed and utilizing diaphragmatic breathing, clear voicing in discourse achieved. Provided additional recommendations to aid breath support at home (see pt instructions).   12/01/23: Entered with portable oxygen. O2 level at 94% after walking in, pt on 2 liters of oxygen this morning. SLP noted evident improvements in pt's voice and breath support today compared to last sessions. Pt also reports completion of circum-laryngeal exercises at home, confirms they have been helpful for reducing tension. ST reintroduced flow phonation exercises targeting breath support. Pt participated effectively given modeling, mod-A, and intermittent breaks for smooth, intentional diaphragmatic breathing. ST initiated [employer] [facility] ([facility]) targeting clear, intentional voice in functional conversation. Pt required occasional verbal cues to consistently project a clear, strong voice. ST plans to continue flow phonation and functional conversation training interventions in future  sessions.  11/17/23: Enters with portable oxygen. Doffed when O2 at comfortable level. Consuming Reflux Raft as recommended, with reported benefit. ST provided strategies to aid consistent administration of PPI before breakfast. Recent MBSS was [facility]. Reported overall mild improvement in breathing and voice since last session. Re-educated recommended voice techniques from prior sessions, including focusing on "forward focused speech"/forward focused resonance. ST encouraged upright posture and whole body relaxation. Introduced and modeled circumlaryngeal massage x1 and diaphragmatic breathing to target relaxation of muscles surrounding the larynx and improved breath support. ST then introduced flow phonation targeting optimized airflow and reduced laryngeal tension. Pt participated effectively, experienced some difficulty relaxing, noted "holding breath" in between inhale and exhale. Pt performance improved with prompting, modeling and and mod-A. ST plans to facilitate practice with flow phonation exercises in future sessions. Increased but inconsistent clear vocal quality noted at end of session.  11/03/23: Entered with portable oxygen (pulse; 3 L). Wife, Zella Ball, present this session. Desaturated to 87% following short walk to SLP office. Steadily rose to 91% and 98% given rest break. Voice c/b hoarse, strained quality with dysfluent rate associated with impaired breath support. Did mildly improve throughout session; however, limited maintenance noted d/t impaired breath support. Continues to complain of "throat congestion" being main barrier to improved voicing. Dicussed previously recommended vocal hygiene practices, including reduction of throat clearing, increased hydration to thin mucous, and strict adherence to reflux recommendations. Inconsistent but increasing carryover of recommendations reported over last week, with benefit. Educated patient on vocal impact of vocal cord atrophy and muscle tension  dysphonia on current voice, with compounding factors of impaired breath support  and reflux. Scheduled for MBSS in two days per patient report of dysphagia to ENT. Will review documentation and recommendations and modify POC accordingly. May benefit from FEES d/t pt complaint of pharyngeal congestion. Recert completed d/t decline in speech and recent concern for dysphagia.   10-08-23: Overt SOB and hoarseness exhibited this session, which was notably worse than last session. Recommended f/u with pulmonologist d/t persistent difficulty breathing impacting voice and overall functioning. Recently saw ENT and pulmonary rehab. Re-educated ENT results and recommendations, including being increasingly intentional with reducing throat clearing and taking reflux raft for reflux management. Suggested gargling with water after inhaler use d/t SLP research re: dysphonia and inhalers. Plan to initiate new interventions, circumlaryngeal message and PhoRTE, next session pending pt respiratory baseline to address newly diagnosed vocal fold atrophy and supraglottic compression.   09-24-23: Visited ED on 09/12/23 d/t shortness of breath. Began course of antibiotics for suspected bronchitis/pneumonia and symptoms have resolved. Despite recent respiratory challenges, pt reports wanting to continue IMST exercises as he has been completing daily HEP and feels it is significantly improving respiratory strength. Additionally, a friend provided positive affirmation of voice strength and quality over 2 hour conversation. In previous session, SLP set device to 26.25 cm H20 based on MIP calculation and instructed pt not to adjust it, however the device was set to 33 cm H20 when pt entered. Reassessed MIP today across 3 trials: 80, 58, 75. Adjusted IMST device to 75% of MIP (80): 60 cm H20 (SLP set to max setting: 41 cm H20). Pt demo'd 25 reps with rare min A. Reviewed reflux precautions as pt endorses increased globus sensation/congestion in  throat. Pt would like f/u with ENT for second opinion; requested today. Recommended inquiring about pulmonary rehab at upcoming appointment d/t success with IMST thus far. Re-assess MIP next session.   09-08-23: Continues to c/o significant asthma impacting respiratory function and subsequently voice. Initiated education and instruction of Administrator, arts (IMST) to address impaired breath support impacting vocal intensity and clarity. Measured MIP across 3 trials: 35, 29, 17. Modified provided IMST device to 75% of MIP (35): 26.25 cm H2O. Instructed patient on how to use device and perform recommended exercises (see pt instructions for HEP). Pt able to demo 25 reps with occasional min A. Current device setting was deemed appropriate. Re-assess MIP next session.   08-26-23: Reports feeling under the weather during the past week and experiencing congestion from asthma today. SLP led discussion about preparing for work meetings occurring after ST. Recommended taking 5 to 10 minutes before meetings to reset breathing and prepare for prolonged speech. Pt identified x2 strategies to focus on during meeting with mod I. In 30 minute unstructured conversation, pt experienced occasional wet vocal quality, likely due to congestion, but independently self-corrected. Otherwise maintained strong breath support and clear vocal quality with rare min A and x3 cough/throat clears. Pt endorses overall improvements in conversational speech, but wants to continue targeting respiratory support.   08-12-23: Reports ongoing improvements in voice as well as breathing with less frequent cues provided by wife to use strategies. Continue to target use of diaphragmatic breathing, intentional upper body relaxation, and forward projection of voice in dual tasking scenarios. Pt aware of increased tension and reduced abdominal breathing during transition periods that include dividing attention between talking and other  task (walking, answering phone). Focused on being intentional with relaxation and breath support to improve vocal quality and intensity during targeted dual tasking activities. Occasional min cues required  to reduce tension, optimize breathing pattern, and project voice during these tasks.   08-06-23: Entered with hoarse vocal quality that improved independently, with pt stating that he focused on using forward resonance. Maintained relaxed appearance, strong breath support, and clear vocal quality during 20-minute unstructured conversation. SLP directed pt to walk down the hall and return to therapy room to read a short passage x2 to target increased breath support and clear vocal quality in speech following exertion. Successfully completed task with occasional fading to rare min A, with improved breath support and vocal quality noted in the 2nd attempt. Benefited from cues to reset before beginning to read and take intentional pauses between sentences. Pt self-rated first attempt a 7.5 and second attempt an 8.5-9 out of 10. Addressed breath support during exertion by completing walking and talking task. Pt maintained structured conversation while completing laps around therapy gym with occasional mod fading to min A to take breaths in between sentences and stay relaxed. Increased success noted in additional trial.   07-29-23: Reported ongoing HEP completion. Endorsed overall improvement of SOVT exercises, although some days are better than others. Targeted SOVTE to reduce vocal tension and improve clarity. Completed sustained exhale, hum, and glides into water with occasional min A to reduce upper body tension and take reset breaths. Introduced straw talking exercise with short phrases to improve forward resonance. Used negative practice to alternate between back-focused voice and forward resonance to improve awareness of vocal quality. Accurately read aloud a list of 8-9 word sentences using forward resonance  with occasional min A. Back-focused voice more apparent at the end of sentences. Benefited from cues to break up long sentences with a pause and reset with a straw or hum.  07-23-23: Entered with clear vocal quality and reported completing SOVT HEP intermittently. Targeted SOVT exercises to reduce tension in vocal folds and improve vocal quality. Aware he can sustain exhale longer than before during exercises (averaged 6 sec for breath only). Completed hum, accents, and happy birthday into water given occasional fading to rare min A to relax upper body tension and increase breath support. Maintained 15 minute conversation with clear vocal quality and minimal upper body tension independently. Pt endorsed increasing awareness of body tension in the community; however, his wife occasionally cues him to relax which causes frustration. SLP counseled pt that the strategies he is learning will take time to master in the community. No coughing or throat clearing observed throughout session.    PATIENT EDUCATION: Education details: see above  Person educated: Patient and Spouse Education method: Medical illustrator Education comprehension: verbalized understanding, returned demonstration, and needs further education  HOME EXERCISE PROGRAM: Abdominal breathing, forward resonance  GOALS: Goals reviewed with patient? Yes  SHORT TERM GOALS: Target date: 06/23/2023 (12/01/2023 for recert)  Pt will reduce throat clearing and coughing by 50% at STG date using trained techniques Baseline: > 9, 6 respectively; 1/0 at STG date Goal status: MET  2.  Pt will employ abdominal breathing during structured tasks with 80% accuracy given occasional min A  Baseline:  Goal status: MET  3.  Pt will employ abdominal breathing during structured conversation with 80% accuracy given occasional min A Baseline:  Goal status: MET  4.  Pt will maintain clear vocal quality in 5-10 minute structured conversations x2  given occasional min A  Baseline:  Goal status: MET  5.  Pt will carryover recommendations and complete HEP as recommended x3 sessions  Baseline:  Goal status: PARTIALLY MET  6.  Pt will achieve clear vocal clarity with use of trained techniques in short, structured conversations 3-5 mins x2 sessions  Baseline:  12-01-23, 12-15-23 Goal status: MET  7.  Pt will demonstrate improved swallow function with recommended strategies and/or exercises given rare min A x2 sessions Baseline:  Goal status: DEFERRED    LONG TERM GOALS: Target date: 07/21/2023 (12/29/2023 for recert)   Pt will reduce throat clearing and coughing by 75% at LTG date using trained techniques Baseline:  Goal status: MET  2.  Pt will employ abdominal breathing during unstructured conversation with 80% accuracy given rare min A Baseline:  Goal status: MET  3.  Pt will maintain clear vocal quality in 15+ minute unstructured conversations x2 given rare min A  Baseline: 07-23-23, 08-06-23 Goal status: MET   4.  Pt will carryover trained voice techniques during community outings x2 with less frequent cues required by wife  Baseline: 08-12-23, 09-24-23 Goal status: MET  5.  Pt will complete IMST HEP with rare min A resulting in increased MIP at 4 weeks Baseline: MIP=26.25 Goal status: MET  6.  Pt will achieve clear vocal clarity with use of trained techniques in unstructured conversations 10+ minutes x2 session  Baseline:  Goal status: MET   7.  Pt will report improved communication effectiveness via PROM by 2 pts at last ST session  Baseline: CPIB= 9; 20 Goal status:MET   8.  Pt will report improved swallow function with recommended strategies and/or exercises at home with mod I Baseline:  Goal status: DEFERRED   ASSESSMENT:  CLINICAL IMPRESSION: Patient is a 68 y.o. M who was seen today for dysphonia. PMHX significant for Multiple Myeloma, interstitial lung disease, and GERD. MBSS displayed no cause for  concern and sufficient swallow. ENT recommended FEES. Completed education and training of voice exercises and techniques to optimize vocal quality. Able to carryover with good accuracy dependent on severity of breathing impairment. Breath support continues to be greatest limitation for additional progress. Discharge summary completed today as SLP education and instruction complete.   OBJECTIVE IMPAIRMENTS: include voice disorder. These impairments are limiting patient from effectively communicating at home and in community. Factors affecting potential to achieve goals and functional outcome are co-morbidities. Patient will benefit from skilled SLP services to address above impairments and improve overall function.  REHAB POTENTIAL: Good  PLAN:  SLP FREQUENCY: 1-2x/week (pt elects for every other week)  SLP DURATION: 8 weeks (+ 8 additional weeks for recert)  PLANNED INTERVENTIONS: Language facilitation, Environmental controls, Cueing hierachy, Internal/external aids, Functional tasks, Multimodal communication approach, SLP instruction and feedback, Compensatory strategies, Patient/family education, and Re-evaluation    Gracy Racer, CCC-SLP 12/29/2023, 8:54 AM

## 2023-12-30 ENCOUNTER — Encounter (HOSPITAL_COMMUNITY)
Admission: RE | Admit: 2023-12-30 | Discharge: 2023-12-30 | Disposition: A | Discharge status: Home / Self Care | Payer: Medicare Other | Source: Ambulatory Visit | Attending: Internal Medicine

## 2023-12-30 DIAGNOSIS — J849 Interstitial pulmonary disease, unspecified: Secondary | ICD-10-CM

## 2023-12-30 NOTE — Progress Notes (Signed)
 Daily Session Note  Patient Details  Name: Samuel [name] MRN: [id number] Date of Birth: 25-Sep-1956 Referring Provider:   Doristine Devoid Pulmonary Rehab Walk Test from 10/06/2023 in [facility]  Referring Provider Ramaswamy       Encounter Date: 12/30/2023  Check In:  Session Check In - 12/30/23 7829       Check-In   Supervising physician immediately available to respond to emergencies [facility] MD immediately available    Physician(s) Tereso Newcomer, PA    Location [facility]    Staff Present Raford Pitcher, MS, ACSM-CEP, Exercise Physiologist;Casey Erin Sons BS, ACSM-CEP, Exercise Physiologist    Virtual Visit No    Medication changes reported     No    Fall or balance concerns reported    No    Tobacco Cessation No Change    Warm-up and Cool-down Performed as group-led instruction    Resistance Training Performed Yes    VAD Patient? No    PAD/SET Patient? No      Pain Assessment   Currently in Pain? No/denies    Multiple Pain Sites No             Capillary Blood Glucose: No results found for this or any previous visit (from the past 24 hours).    Social History   Tobacco Use  Smoking Status Former   Current packs/day: 0.00   Average packs/day: 0.1 packs/day for 3.0 years (0.3 ttl pk-yrs)   Types: Cigarettes   Start date: 10/22/1979   Quit date: 10/21/1982   Years since quitting: 41.2  Smokeless Tobacco Never    Goals Met:  Exercise tolerated well No report of concerns or symptoms today Strength training completed today  Goals Unmet:  Not Applicable  Comments: Service time is from 0809 to 0922.    Dr. Mechele Collin is Medical Director for Pulmonary Rehab at [facility].

## 2023-12-31 ENCOUNTER — Inpatient Hospital Stay: Payer: Medicare Other | Attending: Hematology and Oncology

## 2023-12-31 DIAGNOSIS — C9000 Multiple myeloma not having achieved remission: Secondary | ICD-10-CM | POA: Diagnosis present

## 2023-12-31 LAB — CBC WITH DIFFERENTIAL (CANCER CENTER ONLY)
Abs Immature Granulocytes: 0.03 10*3/uL (ref 0.00–0.07)
Basophils Absolute: 0 10*3/uL (ref 0.0–0.1)
Basophils Relative: 0 %
Eosinophils Absolute: 0.5 10*3/uL (ref 0.0–0.5)
Eosinophils Relative: 6 %
HCT: 38.8 % — ABNORMAL LOW (ref 39.0–52.0)
Hemoglobin: 13 g/dL (ref 13.0–17.0)
Immature Granulocytes: 0 %
Lymphocytes Relative: 18 %
Lymphs Abs: 1.4 10*3/uL (ref 0.7–4.0)
MCH: 30.7 pg (ref 26.0–34.0)
MCHC: 33.5 g/dL (ref 30.0–36.0)
MCV: 91.5 fL (ref 80.0–100.0)
Monocytes Absolute: 0.5 10*3/uL (ref 0.1–1.0)
Monocytes Relative: 7 %
Neutro Abs: 5.3 10*3/uL (ref 1.7–7.7)
Neutrophils Relative %: 69 %
Platelet Count: 263 10*3/uL (ref 150–400)
RBC: 4.24 MIL/uL (ref 4.22–5.81)
RDW: 12.8 % (ref 11.5–15.5)
WBC Count: 7.7 10*3/uL (ref 4.0–10.5)
nRBC: 0 % (ref 0.0–0.2)

## 2023-12-31 LAB — CMP (CANCER CENTER ONLY)
ALT: 18 U/L (ref 0–44)
AST: 23 U/L (ref 15–41)
Albumin: 3.9 g/dL (ref 3.5–5.0)
Alkaline Phosphatase: 50 U/L (ref 38–126)
Anion gap: 3 — ABNORMAL LOW (ref 5–15)
BUN: 18 mg/dL (ref 8–23)
CO2: 31 mmol/L (ref 22–32)
Calcium: 8.7 mg/dL — ABNORMAL LOW (ref 8.9–10.3)
Chloride: 98 mmol/L (ref 98–111)
Creatinine: 0.9 mg/dL (ref 0.61–1.24)
GFR, Estimated: >60 mL/min (ref >60)
Glucose, Bld: 96 mg/dL (ref 70–99)
Potassium: 4.1 mmol/L (ref 3.5–5.1)
Sodium: 132 mmol/L — ABNORMAL LOW (ref 135–145)
Total Bilirubin: 0.4 mg/dL (ref 0.0–1.2)
Total Protein: 7.4 g/dL (ref 6.5–8.1)

## 2023-12-31 LAB — LACTATE DEHYDROGENASE: LDH: 247 U/L — ABNORMAL HIGH (ref 98–192)

## 2024-01-01 ENCOUNTER — Encounter (HOSPITAL_COMMUNITY)
Admission: RE | Admit: 2024-01-01 | Discharge: 2024-01-01 | Disposition: A | Discharge status: Home / Self Care | Payer: Medicare Other | Source: Ambulatory Visit | Attending: Internal Medicine | Admitting: Internal Medicine

## 2024-01-01 VITALS — Wt 203.9 lb

## 2024-01-01 DIAGNOSIS — J849 Interstitial pulmonary disease, unspecified: Secondary | ICD-10-CM | POA: Diagnosis not present

## 2024-01-01 LAB — KAPPA/LAMBDA LIGHT CHAINS
Kappa free light chain: 46.8 mg/L — ABNORMAL HIGH (ref 3.3–19.4)
Kappa, lambda light chain ratio: 2.06 — ABNORMAL HIGH (ref 0.26–1.65)
Lambda free light chains: 22.7 mg/L (ref 5.7–26.3)

## 2024-01-01 NOTE — Telephone Encounter (Signed)
 Will need to be done at ov this month with Dr. Marchelle Gearing

## 2024-01-01 NOTE — Telephone Encounter (Signed)
 Pt needs to be requalified for the POC. They just needs to prove to insurance that he can still maintain the POC.

## 2024-01-01 NOTE — Progress Notes (Signed)
 Daily Session Note  Patient Details  Name: Samuel [name] MRN: [id number] Date of Birth: 01-10-1956 Referring Provider:   Doristine Devoid Pulmonary Rehab Walk Test from 10/06/2023 in [facility]  Referring Provider Ramaswamy       Encounter Date: 01/01/2024  Check In:  Session Check In - 01/01/24 0848       Check-In   Supervising physician immediately available to respond to emergencies [facility] MD immediately available    Physician(s) Oris Drone, NP    Location [facility]    Staff Present Raford Pitcher, MS, ACSM-CEP, Exercise Physiologist;Hodan Wurtz Erin Sons BS, ACSM-CEP, Exercise Physiologist;Samantha Belarus, RD, LDN    Virtual Visit No    Medication changes reported     No    Fall or balance concerns reported    No    Tobacco Cessation No Change    Warm-up and Cool-down Performed as group-led instruction    Resistance Training Performed Yes    VAD Patient? No    PAD/SET Patient? No      Pain Assessment   Currently in Pain? No/denies    Multiple Pain Sites No             Capillary Blood Glucose: No results found for this or any previous visit (from the past 24 hours).    Social History   Tobacco Use  Smoking Status Former   Current packs/day: 0.00   Average packs/day: 0.1 packs/day for 3.0 years (0.3 ttl pk-yrs)   Types: Cigarettes   Start date: 10/22/1979   Quit date: 10/21/1982   Years since quitting: 41.2  Smokeless Tobacco Never    Goals Met:  Proper associated with RPD/PD & O2 Sat Independence with exercise equipment Exercise tolerated well No report of concerns or symptoms today Strength training completed today  Goals Unmet:  Not Applicable  Comments: Service time is from 0812 to 0933.    Dr. Mechele Collin is Medical Director for Pulmonary Rehab at [facility].

## 2024-01-02 ENCOUNTER — Telehealth: Payer: Self-pay | Admitting: Internal Medicine

## 2024-01-02 NOTE — Telephone Encounter (Signed)
 Called and spoke with Elease Hashimoto ([employer]) and made her aware the the patient has an appointment with Dr. Marchelle Gearing on 3/25 and the issue can be addressed during that visit.  She stated she would put a note in his records.  Nothing further needed.

## 2024-01-02 NOTE — Telephone Encounter (Signed)
 Please call and post to him the CT results. I explained the delay w/those results. He was anxious because he is used to seeing them right away on his [facility].

## 2024-01-02 NOTE — Telephone Encounter (Signed)
 Will update him once results are received  He has already been made aware of delay from radiology

## 2024-01-04 LAB — MULTIPLE MYELOMA PANEL, SERUM
Albumin SerPl Elph-Mcnc: 3.4 g/dL (ref 2.9–4.4)
Albumin/Glob SerPl: 1 (ref 0.7–1.7)
Alpha 1: 0.2 g/dL (ref 0.0–0.4)
Alpha2 Glob SerPl Elph-Mcnc: 0.8 g/dL (ref 0.4–1.0)
B-Globulin SerPl Elph-Mcnc: 1 g/dL (ref 0.7–1.3)
Gamma Glob SerPl Elph-Mcnc: 1.7 g/dL (ref 0.4–1.8)
Globulin, Total: 3.7 g/dL (ref 2.2–3.9)
IgA: 335 mg/dL (ref 61–437)
IgG (Immunoglobin G), Serum: 1742 mg/dL — ABNORMAL HIGH (ref 603–1613)
IgM (Immunoglobulin M), Srm: 115 mg/dL (ref 20–172)
M Protein SerPl Elph-Mcnc: 0.5 g/dL — ABNORMAL HIGH
Total Protein ELP: 7.1 g/dL (ref 6.0–8.5)

## 2024-01-05 ENCOUNTER — Telehealth: Payer: Self-pay | Admitting: Internal Medicine

## 2024-01-05 NOTE — Telephone Encounter (Signed)
   Samuel [name] emailed me saying his CT scan results are still not back.  Could you please follow-up with radiology  xxxxxxxxxxxxxxxxxxxxx Hi Dr R.  Hope you have been doing well.  Back on March 5th I had a CT Scan done on my lung and my wife and I have been anxiously awaiting the results.     I understand that there is a shortage of radiologists so hopeful this report will get to Mychart soon.    Anything you can you to help is much appreciated.       Also, back in a November 2024 CT Scan the the comments were that Pulmonary Edema could be a factor which my understanding is fluid buildup in the lungs. If that's the case would Lasik help?  Thanks, as always  [name] Kama, CLU, AAM

## 2024-01-06 ENCOUNTER — Encounter (HOSPITAL_COMMUNITY)
Admission: RE | Admit: 2024-01-06 | Discharge: 2024-01-06 | Disposition: A | Discharge status: Home / Self Care | Payer: Medicare Other | Source: Ambulatory Visit | Attending: Internal Medicine | Admitting: Internal Medicine

## 2024-01-06 DIAGNOSIS — J849 Interstitial pulmonary disease, unspecified: Secondary | ICD-10-CM

## 2024-01-06 NOTE — Telephone Encounter (Signed)
 Results shared ith patient via email

## 2024-01-06 NOTE — Telephone Encounter (Signed)
 Called & spoke with [name]. Pt would like to know results of ct scan. I Informed pt that scans are taking 2-3 wks to be read. Contacted reading room to move scan up. Pt verbalized understanding. Informed pt we would be in contact when ct results are in. Please advise Dr. Marchelle Gearing.

## 2024-01-07 ENCOUNTER — Encounter: Payer: Self-pay | Admitting: Internal Medicine

## 2024-01-07 NOTE — Progress Notes (Signed)
 Pulmonary Individual Treatment Plan  Patient Details  Name: Samuel [name] MRN: [id number] Date of Birth: Mar 05, 1956 Referring Provider:   Doristine Devoid Pulmonary Rehab Walk Test from 10/06/2023 in [facility]  Referring Provider Ramaswamy       Initial Encounter Date:  Flowsheet Row Pulmonary Rehab Walk Test from 10/06/2023 in [facility]  Date 10/06/23       Visit Diagnosis: ILD (interstitial lung disease) (HCC)  Patient's Home Medications on Admission:   Current Outpatient Medications:    acyclovir (ZOVIRAX) 400 MG tablet, Take 1 tablet (400 mg total) by mouth 2 (two) times daily., Disp: 60 tablet, Rfl: 3   albuterol (PROVENTIL) (2.5 MG/3ML) 0.083% nebulizer solution, USE ONE VIAL IN NEBULIZER EVERY 6 HOURS AS NEEDED WHEEZING OR SHORTNESS OF BREATH, Disp: 120 mL, Rfl: 12   albuterol (VENTOLIN HFA) 108 (90 Base) MCG/ACT inhaler, Inhale 2 puffs into the lungs every 6 hours as needed for wheezing or shortness of breath., Disp: 8.5 g, Rfl: 1   ALPRAZolam (XANAX) 0.5 MG tablet, Take 0.5 mg by mouth at bedtime., Disp: , Rfl:    ARMOUR THYROID 90 MG tablet, Take 90 mg by mouth daily., Disp: , Rfl:    aspirin EC 81 MG tablet, Take 1 tablet (81 mg total) by mouth daily. Swallow whole., Disp: 90 tablet, Rfl: 3   ASSESS FULL RANGE PEAK METER DEVI, as directed., Disp: , Rfl:    azelastine (ASTELIN) 0.1 % nasal spray, Place 2 sprays into both nostrils 2 (two) times daily. Use in each nostril as directed, Disp: 30 mL, Rfl: 12   bisoprolol (ZEBETA) 5 MG tablet, Take 1 tablet (5 mg total) by mouth daily., Disp: 30 tablet, Rfl: 2   Budeson-Glycopyrrol-Formoterol (BREZTRI AEROSPHERE) 160-9-4.8 MCG/ACT AERO, Inhale 2 puffs into the lungs in the morning and at bedtime., Disp: 10.7 g, Rfl: 11   cetirizine (ZYRTEC) 10 MG tablet, Take 1 tablet (10 mg total) by mouth daily., Disp: 30  tablet, Rfl: 11   cholecalciferol (VITAMIN D) 1000 UNITS tablet, Take 3,000 Units by mouth daily., Disp: , Rfl:    Dextromethorphan HBr (DELSYM PO), Take by mouth as needed. For cough, Disp: , Rfl:    dextromethorphan-guaiFENesin (MUCINEX DM) 30-600 MG 12hr tablet, Take 1 tablet by mouth 2 (two) times daily., Disp: , Rfl:    EPINEPHrine (EPI-PEN) 0.3 mg/0.3 mL DEVI, Inject 0.3 mg into the muscle as needed., Disp: , Rfl:    ezetimibe-simvastatin (VYTORIN) 10-40 MG tablet, Take 1 tablet by mouth daily., Disp: 90 tablet, Rfl: 3   fluticasone (FLONASE) 50 MCG/ACT nasal spray, 1 spray., Disp: , Rfl:    Guaifenesin 1200 MG TB12, , Disp: , Rfl:    hydrOXYzine (ATARAX) 25 MG tablet, Take 25 mg by mouth every 8 (eight) hours as needed., Disp: , Rfl:    ibuprofen (ADVIL) 200 MG tablet, Take 200 mg by mouth every 6 (six) hours as needed., Disp: , Rfl:    ketoconazole (NIZORAL) 2 % cream, SMARTSIG:1 Application Topical 1 to 2 Times Daily, Disp: , Rfl:    levothyroxine (SYNTHROID) 50 MCG tablet, Take 50 mcg by mouth daily before breakfast., Disp: , Rfl:    montelukast (SINGULAIR) 10 MG tablet, Take 10 mg by mouth at bedtime., Disp: , Rfl:    Multiple Vitamin (MULTIVITAMIN) tablet, Take 1 tablet by mouth daily., Disp: , Rfl:    omeprazole (PRILOSEC) 40 MG  capsule, Take 1 capsule (40 mg total) by mouth 2 (two) times daily. Take 30 minutes before a meal, Disp: 60 capsule, Rfl: 3   OXYGEN, Inhale 2 L into the lungs continuous. As needed, Disp: , Rfl:    predniSONE (DELTASONE) 5 MG tablet, Daily for 3 weeks and then every other day, Disp: 30 tablet, Rfl: 1   sildenafil (REVATIO) 20 MG tablet, Take 20 mg by mouth daily as needed (ED)., Disp: , Rfl:    Simethicone (SIMETHICONE ULTRA STRENGTH) 180 MG CAPS, Take 1 capsule (180 mg total) by mouth 3 (three) times daily as needed., Disp: 90 capsule, Rfl: 0   sodium chloride (OCEAN) 0.65 % SOLN nasal spray, Place 1 spray into both nostrils as needed for congestion., Disp:  , Rfl:    tamsulosin (FLOMAX) 0.4 MG CAPS capsule, Take 0.4 mg by mouth daily., Disp: , Rfl:    triamcinolone cream (KENALOG) 0.1 %, Apply 1 application topically daily as needed (sun burn itch)., Disp: , Rfl:    Turmeric 400 MG CAPS, 1 capsule Orally once a day, Disp: , Rfl:    UNABLE TO FIND, Med Name: Allergy injections once weekly, Disp: , Rfl:   Past Medical History: Past Medical History:  Diagnosis Date   Asthma    GERD (gastroesophageal reflux disease)    High cholesterol    under control.    History of blood transfusion    Hypothyroidism    Interstitial lung disease (HCC)    Multiple myeloma (HCC)    Perennial allergic rhinitis    Pneumonia    Sciatic pain, right    Seasonal allergic rhinitis    Thyroid disease     Tobacco Use: Social History   Tobacco Use  Smoking Status Former   Current packs/day: 0.00   Average packs/day: 0.1 packs/day for 3.0 years (0.3 ttl pk-yrs)   Types: Cigarettes   Start date: 10/22/1979   Quit date: 10/21/1982   Years since quitting: 41.2  Smokeless Tobacco Never    Labs: Review Flowsheet       Latest Ref Rng & Units 06/15/2021 07/12/2021 09/27/2021  Labs for ITP Cardiac and Pulmonary Rehab  Hemoglobin A1c 4.6 - 6.5 % - 5.7  6.2   PH, Arterial 7.350 - 7.450 7.445  - -  PCO2 arterial 32.0 - 48.0 mmHg 34.0  - -  Bicarbonate 20.0 - 28.0 mmol/L 23.0  - -  O2 Saturation % 97.6  - -    Capillary Blood Glucose: No results found for: "GLUCAP"   Pulmonary Assessment Scores:  Pulmonary Assessment Scores     Row Name 10/06/23 1017         ADL UCSD   ADL Phase Entry     SOB Score total 63       CAT Score   CAT Score 19       mMRC Score   mMRC Score 4             UCSD: Self-administered rating of dyspnea associated with activities of daily living (ADLs) 6-point scale (0 = "not at all" to 5 = "maximal or unable to do because of breathlessness")  Scoring Scores range from 0 to 120.  Minimally important difference is 5  units  CAT: CAT can identify the health impairment of COPD patients and is better correlated with disease progression.  CAT has a scoring range of zero to 40. The CAT score is classified into four groups of low (less than 10), medium (10 -  20), high (21-30) and very high (31-40) based on the impact level of disease on health status. A CAT score over 10 suggests significant symptoms.  A worsening CAT score could be explained by an exacerbation, poor medication adherence, poor inhaler technique, or progression of COPD or comorbid conditions.  CAT MCID is 2 points  mMRC: mMRC (Modified Medical Research Council) Dyspnea Scale is used to assess the degree of baseline functional disability in patients of respiratory disease due to dyspnea. No minimal important difference is established. A decrease in score of 1 point or greater is considered a positive change.   Pulmonary Function Assessment:  Pulmonary Function Assessment - 10/06/23 0957       Breath   Bilateral Breath Sounds Rales    Shortness of Breath Yes;Limiting activity;Panic with Shortness of Breath;Fear of Shortness of Breath             Exercise Target Goals: Exercise Program Goal: Individual exercise prescription set using results from initial 6 min walk test and THRR while considering  patient's activity barriers and safety.   Exercise Prescription Goal: Initial exercise prescription builds to 30-45 minutes a day of aerobic activity, 2-3 days per week.  Home exercise guidelines will be given to patient during program as part of exercise prescription that the participant will acknowledge.  Activity Barriers & Risk Stratification:  Activity Barriers & Cardiac Risk Stratification - 10/06/23 0951       Activity Barriers & Cardiac Risk Stratification   Activity Barriers Deconditioning;Muscular Weakness;Shortness of Breath    Cardiac Risk Stratification Moderate             6 Minute Walk:  6 Minute Walk     Row Name  10/06/23 1043         6 Minute Walk   Phase Initial     Distance 730 feet     Distance Feet Change 730 ft     Walk Time 6 minutes     # of Rest Breaks 0     MPH 1.38     METS 1.7     RPE 12     Perceived Dyspnea  1.5     VO2 Peak 5.97     Symptoms No     Resting HR 56 bpm     Resting Oxygen Saturation  95 %     Exercise Oxygen Saturation  during 6 min walk 89 %     Max Ex. HR 74 bpm     Max Ex. BP 128/70     2 Minute Post BP 112/68       Interval HR   1 Minute HR 68     2 Minute HR 72     3 Minute HR 74     4 Minute HR 74     5 Minute HR 74     6 Minute HR 74     2 Minute Post HR 57     Interval Heart Rate? Yes       Interval Oxygen   Interval Oxygen? Yes     Baseline Oxygen Saturation % 95 %     1 Minute Oxygen Saturation % 97 %     1 Minute Liters of Oxygen 2 L     2 Minute Oxygen Saturation % 96 %     2 Minute Liters of Oxygen 2 L     3 Minute Oxygen Saturation % 91 %     3 Minute Liters of Oxygen 2  L     4 Minute Oxygen Saturation % 89 %     4 Minute Liters of Oxygen 2 L     5 Minute Oxygen Saturation % 90 %     5 Minute Liters of Oxygen 2 L     6 Minute Oxygen Saturation % 90 %     6 Minute Liters of Oxygen 2 L     2 Minute Post Oxygen Saturation % 97 %     2 Minute Post Liters of Oxygen 2 L              Oxygen Initial Assessment:  Oxygen Initial Assessment - 11/03/23 0900       Home Oxygen   Home Oxygen Device Home Concentrator;E-Tanks;Portable Concentrator    Sleep Oxygen Prescription Continuous    Liters per minute 2    Home Exercise Oxygen Prescription Continuous    Liters per minute 2    Home Resting Oxygen Prescription Continuous    Liters per minute 2    Compliance with Home Oxygen Use Yes      Program Oxygen Prescription   Program Oxygen Prescription Continuous    Liters per minute 2      Intervention   Short Term Goals To learn and exhibit compliance with exercise, home and travel O2 prescription;To learn and understand  importance of monitoring SPO2 with pulse oximeter and demonstrate accurate use of the pulse oximeter.;To learn and understand importance of maintaining oxygen saturations>88%;To learn and demonstrate proper pursed lip breathing techniques or other breathing techniques. ;To learn and demonstrate proper use of respiratory medications    Long  Term Goals Exhibits compliance with exercise, home  and travel O2 prescription;Maintenance of O2 saturations>88%;Compliance with respiratory medication;Verbalizes importance of monitoring SPO2 with pulse oximeter and return demonstration;Exhibits proper breathing techniques, such as pursed lip breathing or other method taught during program session;Demonstrates proper use of MDI's             Oxygen Re-Evaluation:  Oxygen Re-Evaluation     Row Name 10/08/23 1036 12/01/23 1106 12/31/23 0903         Program Oxygen Prescription   Program Oxygen Prescription Continuous Continuous Continuous     Liters per minute 2 2 8      Comments -- -- 97% on 8L       Home Oxygen   Home Oxygen Device Home Concentrator;E-Tanks;Portable Concentrator Home Concentrator;E-Tanks;Portable Concentrator Home Concentrator;E-Tanks;Portable Concentrator     Sleep Oxygen Prescription Continuous Continuous Continuous     Liters per minute 2 2 2      Home Exercise Oxygen Prescription Continuous Continuous Continuous     Liters per minute 2 2 2      Home Resting Oxygen Prescription Continuous Continuous Continuous     Liters per minute 2 2 2      Compliance with Home Oxygen Use Yes Yes Yes       Goals/Expected Outcomes   Short Term Goals To learn and exhibit compliance with exercise, home and travel O2 prescription;To learn and understand importance of monitoring SPO2 with pulse oximeter and demonstrate accurate use of the pulse oximeter.;To learn and understand importance of maintaining oxygen saturations>88%;To learn and demonstrate proper pursed lip breathing techniques or other  breathing techniques. ;To learn and demonstrate proper use of respiratory medications To learn and exhibit compliance with exercise, home and travel O2 prescription;To learn and understand importance of monitoring SPO2 with pulse oximeter and demonstrate accurate use of the pulse oximeter.;To learn and understand importance of maintaining  oxygen saturations>88%;To learn and demonstrate proper pursed lip breathing techniques or other breathing techniques. ;To learn and demonstrate proper use of respiratory medications To learn and exhibit compliance with exercise, home and travel O2 prescription;To learn and understand importance of monitoring SPO2 with pulse oximeter and demonstrate accurate use of the pulse oximeter.;To learn and understand importance of maintaining oxygen saturations>88%;To learn and demonstrate proper pursed lip breathing techniques or other breathing techniques. ;To learn and demonstrate proper use of respiratory medications     Long  Term Goals Exhibits compliance with exercise, home  and travel O2 prescription;Maintenance of O2 saturations>88%;Compliance with respiratory medication;Verbalizes importance of monitoring SPO2 with pulse oximeter and return demonstration;Exhibits proper breathing techniques, such as pursed lip breathing or other method taught during program session;Demonstrates proper use of MDI's Exhibits compliance with exercise, home  and travel O2 prescription;Maintenance of O2 saturations>88%;Compliance with respiratory medication;Verbalizes importance of monitoring SPO2 with pulse oximeter and return demonstration;Exhibits proper breathing techniques, such as pursed lip breathing or other method taught during program session;Demonstrates proper use of MDI's Exhibits compliance with exercise, home  and travel O2 prescription;Maintenance of O2 saturations>88%;Compliance with respiratory medication;Verbalizes importance of monitoring SPO2 with pulse oximeter and return  demonstration;Exhibits proper breathing techniques, such as pursed lip breathing or other method taught during program session;Demonstrates proper use of MDI's     Goals/Expected Outcomes Compliance and understanding of oxygen saturation monitoring and breathing techniques to decrease shortness of breath. -- Compliance and understanding of oxygen saturation monitoring and breathing techniques to decrease shortness of breath.              Oxygen Discharge (Final Oxygen Re-Evaluation):  Oxygen Re-Evaluation - 12/31/23 0903       Program Oxygen Prescription   Program Oxygen Prescription Continuous    Liters per minute 8    Comments 97% on 8L      Home Oxygen   Home Oxygen Device Home Concentrator;E-Tanks;Portable Concentrator    Sleep Oxygen Prescription Continuous    Liters per minute 2    Home Exercise Oxygen Prescription Continuous    Liters per minute 2    Home Resting Oxygen Prescription Continuous    Liters per minute 2    Compliance with Home Oxygen Use Yes      Goals/Expected Outcomes   Short Term Goals To learn and exhibit compliance with exercise, home and travel O2 prescription;To learn and understand importance of monitoring SPO2 with pulse oximeter and demonstrate accurate use of the pulse oximeter.;To learn and understand importance of maintaining oxygen saturations>88%;To learn and demonstrate proper pursed lip breathing techniques or other breathing techniques. ;To learn and demonstrate proper use of respiratory medications    Long  Term Goals Exhibits compliance with exercise, home  and travel O2 prescription;Maintenance of O2 saturations>88%;Compliance with respiratory medication;Verbalizes importance of monitoring SPO2 with pulse oximeter and return demonstration;Exhibits proper breathing techniques, such as pursed lip breathing or other method taught during program session;Demonstrates proper use of MDI's    Goals/Expected Outcomes Compliance and understanding of  oxygen saturation monitoring and breathing techniques to decrease shortness of breath.             Initial Exercise Prescription:  Initial Exercise Prescription - 10/06/23 1000       Date of Initial Exercise RX and Referring Provider   Date 10/06/23    Referring Provider Ramaswamy    Expected Discharge Date 01/01/23      Oxygen   Oxygen Continuous    Liters 2    Maintain  Oxygen Saturation 88% or higher      Treadmill   MPH 2    Grade 1    Minutes 15      Recumbant Elliptical   Level 1    RPM 40    Watts 50    Minutes 15      Prescription Details   Frequency (times per week) 2    Duration Progress to 30 minutes of continuous aerobic without signs/symptoms of physical distress      Intensity   THRR 40-80% of Max Heartrate 61-122    Ratings of Perceived Exertion 11-13    Perceived Dyspnea 0-4      Progression   Progression Continue to progress workloads to maintain intensity without signs/symptoms of physical distress.      Resistance Training   Training Prescription Yes    Weight blue bands    Reps 10-15             Perform Capillary Blood Glucose checks as needed.  Exercise Prescription Changes:   Exercise Prescription Changes     Row Name 10/14/23 0900 10/21/23 0938 10/28/23 0900 12/09/23 1400 12/23/23 0900     Response to Exercise   Blood Pressure (Admit) 128/80 102/64 -- 136/76 124/78   Blood Pressure (Exercise) 152/70 128/68 -- 156/70 150/78   Blood Pressure (Exit) 120/66 106/68 -- 130/70 126/66   Heart Rate (Admit) 64 bpm 67 bpm -- 82 bpm 59 bpm   Heart Rate (Exercise) 80 bpm 83 bpm -- 85 bpm 72 bpm   Heart Rate (Exit) 77 bpm 80 bpm -- 92 bpm 63 bpm   Oxygen Saturation (Admit) 100 % 95 % -- 97 % 100 %   Oxygen Saturation (Exercise) 87 % 93 % -- 93 % 95 %   Oxygen Saturation (Exit) 97 % 100 % -- 97 % 98 %   Rating of Perceived Exertion (Exercise) 13 12 -- 13 11   Perceived Dyspnea (Exercise) 1 2 -- 3 1   Duration Continue with 30 min of  aerobic exercise without signs/symptoms of physical distress. Continue with 30 min of aerobic exercise without signs/symptoms of physical distress. -- Continue with 30 min of aerobic exercise without signs/symptoms of physical distress. Continue with 30 min of aerobic exercise without signs/symptoms of physical distress.   Intensity THRR unchanged THRR unchanged -- THRR unchanged THRR unchanged     Progression   Progression Continue to progress workloads to maintain intensity without signs/symptoms of physical distress. Continue to progress workloads to maintain intensity without signs/symptoms of physical distress. -- Continue to progress workloads to maintain intensity without signs/symptoms of physical distress. Continue to progress workloads to maintain intensity without signs/symptoms of physical distress.   Average METs 1.9 -- -- -- --     Paramedic Prescription Yes Yes -- Yes Yes   Weight blue bands blue bands -- blue bands blue bands   Reps 10-15 10-15 -- 10-15 10-15   Time 10 Minutes 10 Minutes -- 10 Minutes 10 Minutes     Interval Training   Interval Training Yes -- -- -- --   Equipment Treadmill;Recumbant Elliptical -- -- -- --     Oxygen   Oxygen Continuous Continuous -- Continuous Continuous   Liters 8 3-8 -- 3-10 3-10     Treadmill   MPH 1.5 1.8 -- 1.5 1.7   Grade 0 0 -- 0 1   Minutes 15 15 -- 15 15   METs -- 2.3 -- 1.9 2.4  Recumbant Elliptical   Level 1 3 -- 3 4   RPM 40 45 -- -- --   Watts -- 51 -- -- --   Minutes 15 15 -- 15 15   METs 1.8 2 -- 2.1 2.4     Oxygen   Maintain Oxygen Saturation -- 88% or higher -- 88% or higher 88% or higher    Row Name 01/01/24 0912             Response to Exercise   Blood Pressure (Admit) 114/60       Blood Pressure (Exit) 112/70       Heart Rate (Admit) 62 bpm       Heart Rate (Exercise) 79 bpm       Heart Rate (Exit) 64 bpm       Oxygen Saturation (Admit) 100 %  6L       Oxygen Saturation  (Exercise) 95 %  8L       Oxygen Saturation (Exit) 99 %  6L       Rating of Perceived Exertion (Exercise) 12       Perceived Dyspnea (Exercise) 1       Duration Continue with 30 min of aerobic exercise without signs/symptoms of physical distress.       Intensity THRR unchanged         Progression   Progression Continue to progress workloads to maintain intensity without signs/symptoms of physical distress.         Resistance Training   Training Prescription Yes       Weight black bands       Reps 10-15       Time 10 Minutes         Oxygen   Oxygen Continuous       Liters 6-8         Treadmill   MPH 1.5       Grade 1       Minutes 15       METs 2.2         Recumbant Elliptical   Level 4       Minutes 15       METs 2.7         Oxygen   Maintain Oxygen Saturation 88% or higher                Exercise Comments:   Exercise Comments     Row Name 10/14/23 0946           Exercise Comments Pt completed first day of group exercise. He exercised on the recumbent elliptical for 15 min, level 1, METs 1.8. He then walked on the treadmill for 15 min, speed 1.5, 0 incline. Pt required increased O2 to 8L on TM. He was able to perform warm up and cool down standing, including squats. Tolerated exercise fair, admits he is tired. Discussed METs with good reception.                Exercise Goals and Review:   Exercise Goals     Row Name 10/06/23 [phone]             Exercise Goals   Increase Physical Activity Yes       Intervention Provide advice, education, support and counseling about physical activity/exercise needs.;Develop an individualized exercise prescription for aerobic and resistive training based on initial evaluation findings, risk stratification, comorbidities and participant's personal goals.       Expected Outcomes Short Term:  Attend rehab on a regular basis to increase amount of physical activity.;Long Term: Add in home exercise to make exercise part of  routine and to increase amount of physical activity.;Long Term: Exercising regularly at least 3-5 days a week.       Increase Strength and Stamina Yes       Intervention Provide advice, education, support and counseling about physical activity/exercise needs.;Develop an individualized exercise prescription for aerobic and resistive training based on initial evaluation findings, risk stratification, comorbidities and participant's personal goals.       Expected Outcomes Short Term: Increase workloads from initial exercise prescription for resistance, speed, and METs.;Short Term: Perform resistance training exercises routinely during rehab and add in resistance training at home;Long Term: Improve cardiorespiratory fitness, muscular endurance and strength as measured by increased METs and functional capacity ( )       Able to understand and use rate of perceived exertion (RPE) scale Yes       Intervention Provide education and explanation on how to use RPE scale       Expected Outcomes Short Term: Able to use RPE daily in rehab to express subjective intensity level;Long Term:  Able to use RPE to guide intensity level when exercising independently       Able to understand and use Dyspnea scale Yes       Intervention Provide education and explanation on how to use Dyspnea scale       Expected Outcomes Short Term: Able to use Dyspnea scale daily in rehab to express subjective sense of shortness of breath during exertion;Long Term: Able to use Dyspnea scale to guide intensity level when exercising independently       Knowledge and understanding of Target Heart Rate Range (THRR) Yes       Intervention Provide education and explanation of THRR including how the numbers were predicted and where they are located for reference       Expected Outcomes Short Term: Able to state/look up THRR;Long Term: Able to use THRR to govern intensity when exercising independently;Short Term: Able to use daily as guideline for  intensity in rehab       Understanding of Exercise Prescription Yes       Intervention Provide education, explanation, and written materials on patient's individual exercise prescription       Expected Outcomes Short Term: Able to explain program exercise prescription;Long Term: Able to explain home exercise prescription to exercise independently                Exercise Goals Re-Evaluation :  Exercise Goals Re-Evaluation     Row Name 10/08/23 1035 11/03/23 0857 12/01/23 1100 12/31/23 0901       Exercise Goal Re-Evaluation   Exercise Goals Review Increase Physical Activity;Able to understand and use Dyspnea scale;Understanding of Exercise Prescription;Increase Strength and Stamina;Knowledge and understanding of Target Heart Rate Range (THRR);Able to understand and use rate of perceived exertion (RPE) scale Increase Physical Activity;Able to understand and use Dyspnea scale;Understanding of Exercise Prescription;Increase Strength and Stamina;Knowledge and understanding of Target Heart Rate Range (THRR);Able to understand and use rate of perceived exertion (RPE) scale Increase Physical Activity;Able to understand and use Dyspnea scale;Understanding of Exercise Prescription;Increase Strength and Stamina;Knowledge and understanding of Target Heart Rate Range (THRR);Able to understand and use rate of perceived exertion (RPE) scale Increase Physical Activity;Able to understand and use Dyspnea scale;Understanding of Exercise Prescription;Increase Strength and Stamina;Knowledge and understanding of Target Heart Rate Range (THRR);Able to understand and use rate of perceived exertion (  RPE) scale    Comments Samuel [name] is scheduled to begin exercise on 12/24. Will monitor and progress as able. Samuel [name] has completed 3 exercise sessions. He exercises for 15 min on the recumbent elliptical and treadmill. Jim averages 2 METs at level 3 on the recumbent elliptical and 2.3 METs at 1.8 mph on the treadmill. He performs the  warmup and cooldown standing without limitations. It is too soon to notate any discernable progressions. Samuel [name] did miss class the past week due to illness. Will continue to monitor and progress as able. Samuel [name] has completed 3 exercise sessions. He exercises for 15 min on the recumbent elliptical and treadmill. Jim averages 2 METs at level 3 on the recumbent elliptical and 2.3 METs at 1.8 mph on the treadmill. He performs the warmup and cooldown standing without limitations.Samuel [name] has missed several sessions due to medical hold and personal committments. He plans to come back 2/11. Will continue to monitor and progress as able. Samuel [name] has completed 11 exercise sessions. He exercises for 15 min on the recumbent elliptical and treadmill. Jim averages 1.8 METs at level 4 on the recumbent elliptical and 1.8 METs at 1.2 mph on the treadmill. He performs the warmup and cooldown standing without limitations. Samuel [name] has increased his level on the recumbent elliptical, although METs have not increased. His treadmill speed has decreased. Samuel [name] is have a hard time progressing right now due to his shortness of breath. He has noticed an increase in his shortness of breath. Will continue to monitor and progress as able.    Expected Outcomes Through exercise at rehab and home, the patient will decrease shortness of breath with daily activities and feel confident in carrying out an exercise regimen at home. Through exercise at rehab and home, the patient will decrease shortness of breath with daily activities and feel confident in carrying out an exercise regimen at home. Through exercise at rehab and home, the patient will decrease shortness of breath with daily activities and feel confident in carrying out an exercise regimen at home. Through exercise at rehab and home, the patient will decrease shortness of breath with daily activities and feel confident in carrying out an exercise regimen at home.             Discharge Exercise  Prescription (Final Exercise Prescription Changes):  Exercise Prescription Changes - 01/01/24 0912       Response to Exercise   Blood Pressure (Admit) 114/60    Blood Pressure (Exit) 112/70    Heart Rate (Admit) 62 bpm    Heart Rate (Exercise) 79 bpm    Heart Rate (Exit) 64 bpm    Oxygen Saturation (Admit) 100 %   6L   Oxygen Saturation (Exercise) 95 %   8L   Oxygen Saturation (Exit) 99 %   6L   Rating of Perceived Exertion (Exercise) 12    Perceived Dyspnea (Exercise) 1    Duration Continue with 30 min of aerobic exercise without signs/symptoms of physical distress.    Intensity THRR unchanged      Progression   Progression Continue to progress workloads to maintain intensity without signs/symptoms of physical distress.      Resistance Training   Training Prescription Yes    Weight black bands    Reps 10-15    Time 10 Minutes      Oxygen   Oxygen Continuous    Liters 6-8      Treadmill   MPH 1.5    Grade 1  Minutes 15    METs 2.2      Recumbant Elliptical   Level 4    Minutes 15    METs 2.7      Oxygen   Maintain Oxygen Saturation 88% or higher             Nutrition:  Target Goals: Understanding of nutrition guidelines, daily intake of sodium 1500mg , cholesterol 200mg , calories 30% from fat and 7% or less from saturated fats, daily to have 5 or more servings of fruits and vegetables.  Biometrics:    Nutrition Therapy Plan and Nutrition Goals:  Nutrition Therapy & Goals - 12/29/23 0953       Nutrition Therapy   Diet General Healthy Diet      Personal Nutrition Goals   Nutrition Goal Patient to improve diet quality by using the plate method as a guide for meal planning to include lean protein/plant protein, fruits, vegetables, whole grains, nonfat dairy as part of a well-balanced diet.   Goal in progress.   Comments Goals in progress. 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 He has tried multiple  dietary interventions in the past including organic food diet, turmeric protein shakes, etc. He continues regular follow-up with oncology, pulmonology, speech therapy; he has also received multiple opinions from [facility], [facility], [place], etc. He is down 2.9# since starting with our program. Patient will continue to benefit from participation in pulmonary rehab for nutrition, exercise, and lifestyle modification support.      Intervention Plan   Intervention Prescribe, educate and counsel regarding individualized specific dietary modifications aiming towards targeted core components such as weight, hypertension, lipid management, diabetes, heart failure and other comorbidities.;Nutrition handout(s) given to patient.    Expected Outcomes Short Term Goal: Understand basic principles of dietary content, such as calories, fat, sodium, cholesterol and nutrients.;Long Term Goal: Adherence to prescribed nutrition plan.             Nutrition Assessments:  MEDIFICTS Score Key: >=70 Need to make dietary changes  40-70 Heart Healthy Diet <= 40 Therapeutic Level Cholesterol Diet   Picture Your Plate Scores: <56 Unhealthy dietary pattern with much room for improvement. 41-50 Dietary pattern unlikely to meet recommendations for good health and room for improvement. 51-60 More healthful dietary pattern, with some room for improvement.  >60 Healthy dietary pattern, although there may be some specific behaviors that could be improved.    Nutrition Goals Re-Evaluation:  Nutrition Goals Re-Evaluation     Row Name 12/29/23 0953             Goals   Current Weight 205 lb 0.4 oz (93 kg)  weight from last attended session on 12/25/22       Comment CBC WNL       Expected Outcome Goals in progress. 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 He has tried multiple dietary interventions in the past including organic food diet, turmeric protein  shakes, etc. He continues regular follow-up with oncology, pulmonology, speech therapy; he has also received multiple opinions from [facility], [facility], [place], etc. He is down 2.9# since starting with our program. Patient will continue to benefit from participation in pulmonary rehab for nutrition, exercise, and lifestyle modification support.                Nutrition Goals Discharge (Final Nutrition Goals Re-Evaluation):  Nutrition Goals Re-Evaluation - 12/29/23 2130  Goals   Current Weight 205 lb 0.4 oz (93 kg)   weight from last attended session on 12/25/22   Comment CBC WNL    Expected Outcome Goals in progress. 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 He has tried multiple dietary interventions in the past including organic food diet, turmeric protein shakes, etc. He continues regular follow-up with oncology, pulmonology, speech therapy; he has also received multiple opinions from [facility], [facility], [place], etc. He is down 2.9# since starting with our program. Patient will continue to benefit from participation in pulmonary rehab for nutrition, exercise, and lifestyle modification support.             Psychosocial: Target Goals: Acknowledge presence or absence of significant depression and/or stress, maximize coping skills, provide positive support system. Participant is able to verbalize types and ability to use techniques and skills needed for reducing stress and depression.  Initial Review & Psychosocial Screening:  Initial Psych Review & Screening - 10/06/23 0949       Initial Review   Current issues with Current Anxiety/Panic;Current Psychotropic Meds;Current Stress Concerns    Source of Stress Concerns Chronic Illness;Unable to participate in former interests or hobbies    Comments Pt is stressed about his health. He also admits to having days of sadness because he is not able to do activities such  as hiking anymore. He has good support from his family and friends.      Family Dynamics   Good Support System? Yes      Barriers   Psychosocial barriers to participate in program The patient should benefit from training in stress management and relaxation.;Psychosocial barriers identified (see note)      Screening Interventions   Interventions Encouraged to exercise    Expected Outcomes Long Term Goal: Stressors or current issues are controlled or eliminated.;Short Term goal: Utilizing psychosocial counselor, staff and physician to assist with identification of specific Stressors or current issues interfering with healing process. Setting desired goal for each stressor or current issue identified.             Quality of Life Scores:  Scores of 19 and below usually indicate a poorer quality of life in these areas.  A difference of  2-3 points is a clinically meaningful difference.  A difference of 2-3 points in the total score of the Quality of Life Index has been associated with significant improvement in overall quality of life, self-image, physical symptoms, and general health in studies assessing change in quality of life.  PHQ-9: Review Flowsheet       10/06/2023  Depression screen PHQ 2/9  Decreased Interest 1  Down, Depressed, Hopeless 1  PHQ - 2 Score 2  Altered sleeping 0  Tired, decreased energy 0  Change in appetite 0  Feeling bad or failure about yourself  1  Trouble concentrating 0  Moving slowly or fidgety/restless 1  Suicidal thoughts 0  PHQ-9 Score 4  Difficult doing work/chores Somewhat difficult   Interpretation of Total Score  Total Score Depression Severity:  1-4 = Minimal depression, 5-9 = Mild depression, 10-14 = Moderate depression, 15-19 = Moderately severe depression, 20-27 = Severe depression   Psychosocial Evaluation and Intervention:  Psychosocial Evaluation - 10/06/23 0950       Psychosocial Evaluation & Interventions   Interventions  Encouraged to exercise with the program and follow exercise prescription;Stress management education    Comments Samuel [name] admits to having stress due to  his current health issues. He also admits to being "a little down". some days due to not being able to do certain activities anymore.  He is currently taking psychostropic meds. He denies needing a referral for a therapist at this time.    Expected Outcomes For Samuel [name] to have less stress throughout the program    Continue Psychosocial Services  No Follow up required             Psychosocial Re-Evaluation:  Psychosocial Re-Evaluation     Row Name 10/08/23 1212 11/10/23 0954 12/03/23 1501 12/29/23 1336       Psychosocial Re-Evaluation   Current issues with Current Depression;Current Psychotropic Meds;Current Stress Concerns Current Depression;Current Psychotropic Meds;Current Stress Concerns Current Depression;Current Psychotropic Meds;Current Stress Concerns Current Depression;Current Psychotropic Meds;Current Stress Concerns    Comments No changes since orienation. Samuel [name] is scheduled to start the program on 10/14/23. Samuel [name] has been placed on medical hold due to his worsening disease. His last class was on 10/21/23. He is currently being seen by speech therapy, oncology, and pulmonary. Samuel [name] has returned to class and is in good spirits. He is glad to be back in class. He feels his depression is stable at this time. No new psychosocial barriers or concerns at this time. Samuel [name] denies any new psychosocial barriers or concerns at this time. He feels his stress and depression are controlled at this time.    Expected Outcomes For Samuel [name] to attend PR free of any psy/soc barriers or concerns. For Samuel [name] to use positive coping mechanisms to reduce his stress. For Samuel [name] to attend PR free of any psy/soc barriers or concerns. For Samuel [name] to use positive coping mechanisms to reduce his stress. For Samuel [name] to attend PR free of any psy/soc barriers or concerns. For Samuel [name] to use positive coping  mechanisms to reduce his stress. For Samuel [name] to attend PR free of any psy/soc barriers or concerns. For Samuel [name] to use positive coping mechanisms to reduce his stress.    Interventions Encouraged to attend Pulmonary Rehabilitation for the exercise Encouraged to attend Pulmonary Rehabilitation for the exercise Encouraged to attend Pulmonary Rehabilitation for the exercise Encouraged to attend Pulmonary Rehabilitation for the exercise    Continue Psychosocial Services  No Follow up required Follow up required by staff Follow up required by staff No Follow up required             Psychosocial Discharge (Final Psychosocial Re-Evaluation):  Psychosocial Re-Evaluation - 12/29/23 1336       Psychosocial Re-Evaluation   Current issues with Current Depression;Current Psychotropic Meds;Current Stress Concerns    Comments Samuel [name] denies any new psychosocial barriers or concerns at this time. He feels his stress and depression are controlled at this time.    Expected Outcomes For Samuel [name] to attend PR free of any psy/soc barriers or concerns. For Samuel [name] to use positive coping mechanisms to reduce his stress.    Interventions Encouraged to attend Pulmonary Rehabilitation for the exercise    Continue Psychosocial Services  No Follow up required             Education: Education Goals: Education classes will be provided on a weekly basis, covering required topics. Participant will state understanding/return demonstration of topics presented.  Learning Barriers/Preferences:  Learning Barriers/Preferences - 10/06/23 0950       Learning Barriers/Preferences   Learning Barriers Sight    Learning Preferences Individual Instruction;Written Material             Education Topics: Know Your Numbers Group  instruction that is supported by a PowerPoint presentation. Instructor discusses importance of knowing and understanding resting, exercise, and post-exercise oxygen saturation, heart rate, and blood pressure.  Oxygen saturation, heart rate, blood pressure, rating of perceived exertion, and dyspnea are reviewed along with a normal range for these values.    Exercise for the Pulmonary Patient Group instruction that is supported by a PowerPoint presentation. Instructor discusses benefits of exercise, core components of exercise, frequency, duration, and intensity of an exercise routine, importance of utilizing pulse oximetry during exercise, safety while exercising, and options of places to exercise outside of rehab.  Flowsheet Row PULMONARY REHAB OTHER RESPIRATORY from 10/16/2023 in [facility]  Date 10/16/23  Educator EP  Instruction Review Code 1- Verbalizes Understanding       MET Level  Group instruction provided by PowerPoint, verbal discussion, and written material to support subject matter. Instructor reviews what METs are and how to increase METs.  Flowsheet Row PULMONARY REHAB OTHER RESPIRATORY from 12/16/2023 in [facility]  Date 12/16/23  Educator EP  Instruction Review Code 1- Verbalizes Understanding       Pulmonary Medications Verbally interactive group education provided by instructor with focus on inhaled medications and proper administration.   Anatomy and Physiology of the Respiratory System Group instruction provided by PowerPoint, verbal discussion, and written material to support subject matter. Instructor reviews respiratory cycle and anatomical components of the respiratory system and their functions. Instructor also reviews differences in obstructive and restrictive respiratory diseases with examples of each.  Flowsheet Row PULMONARY REHAB OTHER RESPIRATORY from 12/25/2023 in [facility]  Date 12/25/23  Educator RT  Instruction Review Code 1- Verbalizes Understanding       Oxygen Safety Group  instruction provided by PowerPoint, verbal discussion, and written material to support subject matter. There is an overview of "What is Oxygen" and "Why do we need it".  Instructor also reviews how to create a safe environment for oxygen use, the importance of using oxygen as prescribed, and the risks of noncompliance. There is a brief discussion on traveling with oxygen and resources the patient may utilize.   Oxygen Use Group instruction provided by PowerPoint, verbal discussion, and written material to discuss how supplemental oxygen is prescribed and different types of oxygen supply systems. Resources for more information are provided.    Breathing Techniques Group instruction that is supported by demonstration and informational handouts. Instructor discusses the benefits of pursed lip and diaphragmatic breathing and detailed demonstration on how to perform both.     Risk Factor Reduction Group instruction that is supported by a PowerPoint presentation. Instructor discusses the definition of a risk factor, different risk factors for pulmonary disease, and how the heart and lungs work together. Flowsheet Row PULMONARY REHAB OTHER RESPIRATORY from 12/04/2023 in [facility] for Heart, Vascular, [facility] Lung [facility]  Date 12/04/23  Educator EP  Instruction Review Code 1- Verbalizes Understanding       Pulmonary Diseases Group instruction provided by PowerPoint, verbal discussion, and written material to support subject matter. Instructor gives an overview of the different type of pulmonary diseases. There is also a discussion on risk factors and symptoms as well as ways to manage the diseases. Flowsheet Row PULMONARY REHAB OTHER RESPIRATORY from 12/18/2023 in [facility] for Heart, Vascular, & Lung Health  Date 12/18/23  Educator RT  Instruction Review Code 1- Verbalizes Understanding       Stress and Energy Conservation Group instruction  provided by PowerPoint, verbal discussion, and written material to support subject matter. Instructor gives an overview of stress and the impact it can have on the body. Instructor also reviews ways to reduce stress. There is also a discussion on energy conservation and ways to conserve energy throughout the day.   Warning Signs and Symptoms Group instruction provided by PowerPoint, verbal discussion, and written material to support subject matter. Instructor reviews warning signs and symptoms of stroke, heart attack, cold and flu. Instructor also reviews ways to prevent the spread of infection.   Other Education Group or individual verbal, written, or video instructions that support the educational goals of the pulmonary rehab program.    Knowledge Questionnaire Score:  Knowledge Questionnaire Score - 10/06/23 1019       Knowledge Questionnaire Score   Pre Score 17/18             Core Components/Risk Factors/Patient Goals at Admission:  Personal Goals and Risk Factors at Admission - 10/06/23 0951       Core Components/Risk Factors/Patient Goals on Admission   Improve shortness of breath with ADL's Yes    Intervention Provide education, individualized exercise plan and daily activity instruction to help decrease symptoms of SOB with activities of daily living.    Expected Outcomes Short Term: Improve cardiorespiratory fitness to achieve a reduction of symptoms when performing ADLs;Long Term: Be able to perform more ADLs without symptoms or delay the onset of symptoms    Increase knowledge of respiratory medications and ability to use respiratory devices properly  Yes    Intervention Provide education and demonstration as needed of appropriate use of medications, inhalers, and oxygen therapy.    Expected Outcomes Short Term: Achieves understanding of medications use. Understands that oxygen is a medication prescribed by physician. Demonstrates appropriate use of inhaler and oxygen  therapy.;Long Term: Maintain appropriate use of medications, inhalers, and oxygen therapy.    Stress Yes    Intervention Offer individual and/or small group education and counseling on adjustment to heart disease, stress management and health-related lifestyle change. Teach and support self-help strategies.;Refer participants experiencing significant psychosocial distress to appropriate mental health specialists for further evaluation and treatment. When possible, include family members and significant others in education/counseling sessions.    Expected Outcomes Short Term: Participant demonstrates changes in health-related behavior, relaxation and other stress management skills, ability to obtain effective social support, and compliance with psychotropic medications if prescribed.;Long Term: Emotional wellbeing is indicated by absence of clinically significant psychosocial distress or social isolation.             Core Components/Risk Factors/Patient Goals Review:   Goals and Risk Factor Review     Row Name 10/08/23 1220 11/10/23 0956 12/03/23 1504 12/29/23 1339       Core Components/Risk Factors/Patient Goals Review   Personal Goals Review Develop more efficient breathing techniques such as purse lipped breathing and diaphragmatic breathing and practicing self-pacing with activity.;Increase knowledge of respiratory medications and ability to use respiratory devices properly.;Improve shortness of breath with ADL's;Stress Develop more efficient breathing techniques such as purse lipped breathing and diaphragmatic breathing and practicing self-pacing with activity.;Increase knowledge of respiratory medications and ability to use respiratory devices properly.;Improve shortness of breath with ADL's;Stress Improve shortness of breath with ADL's;Develop more efficient breathing techniques such as purse lipped breathing and diaphragmatic breathing and practicing self-pacing with activity.;Increase  knowledge of respiratory medications and ability to use respiratory devices properly.;Stress Improve shortness of breath with ADL's;Develop more efficient breathing techniques such as purse lipped breathing and diaphragmatic breathing and practicing self-pacing with activity.;Increase knowledge of respiratory medications and ability to use respiratory devices properly.;Stress    Review No changes since orientation. Samuel [name] is scheduled to start the program on 10/14/23 Samuel [name] has been placed on medical hold due to his worsening disease. He attended 3 classes so far. Unable to assess goals yet. His last class was on 10/21/23. He is currently being seen by speech therapy, oncology, and pulmonary. He will continue to benefit from PR for nutrition, education, exercise, and lifestyle modification. Samuel [name] has returned back to class and has attended on session.  Goal progressing for developing more efficient breathing techniques such as purse lipped breathing and diaphragmatic breathing; and practicing self-pacing with activity. Goal progressing for improving shortness of breath with ADL's. Goal progressing for increase knowledge of respiratory medications and ability to use respiratory devices properly. Goal progressing for improving stress.  We will continue to monitor her progress throughout the program. Goal progressing for developing more efficient breathing techniques such as purse lipped breathing and diaphragmatic breathing; and practicing self-pacing with activity. Goal progressing for improving shortness of breath with ADL's. Samuel [name] is currently using 4-8L O2 while exercising to keep sats >88%. Goal met for increase knowledge of respiratory medications and ability to use respiratory devices properly. Samuel [name] has demonstrated proper use of his MDI to staff. Goal met for improving stress. Samuel [name] states his stress has greatly reduced since starting the program. We will continue to monitor his progress throughout the program.     Expected Outcomes For Samuel [name] to develop more efficient breathing techniques such as purse lipped breathing and diaphragmatic breathing; and practicing self-pacing activities, to increase his knowledge of respiratory medications and ability to use respiratory devices properly, improve his shortness of breath with ADLs, and to reduce stress. For Samuel [name] to develop more efficient breathing techniques such as purse lipped breathing and diaphragmatic breathing; and practicing self-pacing activities, to increase his knowledge of respiratory medications and ability to use respiratory devices properly, improve his shortness of breath with ADLs, and to reduce stress. For Samuel [name] to develop more efficient breathing techniques such as purse lipped breathing and diaphragmatic breathing; and practicing self-pacing activities, to increase his knowledge of respiratory medications and ability to use respiratory devices properly, improve his shortness of breath with ADLs, and to reduce stress. For Samuel [name] to develop more efficient breathing techniques such as purse lipped breathing and diaphragmatic breathing; and practicing self-pacing activities and improve his shortness of breath with ADLs             Core Components/Risk Factors/Patient Goals at Discharge (Final Review):   Goals and Risk Factor Review - 12/29/23 1339       Core Components/Risk Factors/Patient Goals Review   Personal Goals Review Improve shortness of breath with ADL's;Develop more efficient breathing techniques such as purse lipped breathing and diaphragmatic breathing and practicing self-pacing with activity.;Increase knowledge of respiratory medications and ability to use respiratory devices properly.;Stress    Review Goal progressing for developing more efficient breathing techniques such as purse lipped breathing and diaphragmatic breathing; and practicing self-pacing with activity. Goal progressing for improving shortness of breath with ADL's. Samuel [name] is  currently using 4-8L O2 while exercising to keep sats >88%. Goal met for increase knowledge of respiratory medications and ability to use respiratory devices properly. Samuel [name] has demonstrated proper use of his  MDI to staff. Goal met for improving stress. Samuel [name] states his stress has greatly reduced since starting the program. We will continue to monitor his progress throughout the program.    Expected Outcomes For Samuel [name] to develop more efficient breathing techniques such as purse lipped breathing and diaphragmatic breathing; and practicing self-pacing activities and improve his shortness of breath with ADLs             ITP Comments: Pt is making expected progress toward Pulmonary Rehab goals after completing 12 session(s). Recommend continued exercise, life style modification, education, and utilization of breathing techniques to increase stamina and strength, while also decreasing shortness of breath with exertion.     Comments: Dr. Mechele Collin is Medical Director for Pulmonary Rehab at [facility].

## 2024-01-07 NOTE — Progress Notes (Signed)
 Result emailed to patient. Wil discuss right heart cath at viisit

## 2024-01-08 ENCOUNTER — Encounter (HOSPITAL_COMMUNITY)
Admission: RE | Admit: 2024-01-08 | Discharge: 2024-01-08 | Disposition: A | Discharge status: Home / Self Care | Payer: Medicare Other | Source: Ambulatory Visit | Attending: Internal Medicine

## 2024-01-08 DIAGNOSIS — J849 Interstitial pulmonary disease, unspecified: Secondary | ICD-10-CM

## 2024-01-08 NOTE — Progress Notes (Signed)
 Daily Session Note  Patient Details  Name: Samuel [name] MRN: [id number] Date of Birth: 11-23-1955 Referring Provider:   Doristine Devoid Pulmonary Rehab Walk Test from 10/06/2023 in [facility]  Referring Provider Ramaswamy       Encounter Date: 01/08/2024  Check In:  Session Check In - 01/08/24 0831       Check-In   Supervising physician immediately available to respond to emergencies [facility] MD immediately available    Physician(s) Reather Littler, NP    Location [facility]    Staff Present Raford Pitcher, MS, ACSM-CEP, Exercise Physiologist;Casey Erin Sons BS, ACSM-CEP, Exercise Physiologist;Mary Gerre Scull, RN, BSN    Virtual Visit No    Medication changes reported     No    Fall or balance concerns reported    No    Tobacco Cessation No Change    Warm-up and Cool-down Performed as group-led instruction    Resistance Training Performed Yes    VAD Patient? No    PAD/SET Patient? No      Pain Assessment   Currently in Pain? No/denies    Multiple Pain Sites No             Capillary Blood Glucose: No results found for this or any previous visit (from the past 24 hours).    Social History   Tobacco Use  Smoking Status Former   Current packs/day: 0.00   Average packs/day: 0.1 packs/day for 3.0 years (0.3 ttl pk-yrs)   Types: Cigarettes   Start date: 10/22/1979   Quit date: 10/21/1982   Years since quitting: 41.2  Smokeless Tobacco Never    Goals Met:  Proper associated with RPD/PD & O2 Sat Exercise tolerated well No report of concerns or symptoms today Strength training completed today  Goals Unmet:  Not Applicable  Comments: Service time is from 0809 to 0915.    Dr. Mechele Collin is Medical Director for Pulmonary Rehab at [facility].

## 2024-01-13 ENCOUNTER — Encounter (HOSPITAL_COMMUNITY): Admission: RE | Admit: 2024-01-13 | Payer: Medicare Other | Source: Ambulatory Visit

## 2024-01-13 ENCOUNTER — Telehealth (HOSPITAL_COMMUNITY): Payer: Self-pay | Admitting: *Deleted

## 2024-01-13 ENCOUNTER — Encounter: Payer: Self-pay | Admitting: Internal Medicine

## 2024-01-13 ENCOUNTER — Ambulatory Visit: Payer: Medicare Other | Admitting: Internal Medicine

## 2024-01-13 ENCOUNTER — Telehealth: Payer: Self-pay | Admitting: Internal Medicine

## 2024-01-13 VITALS — BP 120/70 | HR 63 | Temp 97.0°F | Ht 70.0 in | Wt 205.0 lb

## 2024-01-13 DIAGNOSIS — J9611 Chronic respiratory failure with hypoxia: Secondary | ICD-10-CM

## 2024-01-13 DIAGNOSIS — J984 Other disorders of lung: Secondary | ICD-10-CM

## 2024-01-13 DIAGNOSIS — J849 Interstitial pulmonary disease, unspecified: Secondary | ICD-10-CM | POA: Diagnosis not present

## 2024-01-13 DIAGNOSIS — I288 Other diseases of pulmonary vessels: Secondary | ICD-10-CM

## 2024-01-13 DIAGNOSIS — R053 Chronic cough: Secondary | ICD-10-CM | POA: Diagnosis not present

## 2024-01-13 DIAGNOSIS — J698 Pneumonitis due to inhalation of other solids and liquids: Secondary | ICD-10-CM

## 2024-01-13 DIAGNOSIS — R0609 Other forms of dyspnea: Secondary | ICD-10-CM

## 2024-01-13 MED ORDER — HYDROCODONE BIT-HOMATROP MBR 5-1.5 MG/5ML PO SOLN: 5.0000 mL | Freq: Four times a day (QID) | ORAL | 0 refills | Status: DC | PRN

## 2024-01-13 NOTE — Progress Notes (Signed)
 IOV 10/04/2011  68 year old male. Allergies and possible asthma. Hypothyroidism. Anxiety state but no formal diagnosis. Has history of claustrophobia as well Non-smoker. Firefighter. Samuel [name] with CAD - [name]/p CABG at age 78 (now 63y)  Referred by Samuel [name] [name]. Reports dyspnea. INsidious onset "all my life". Increased after starting allergy shots in June 2012. Says during hikes after he gets going it gets better. Same in gym. Notices more when he is starting out or stationary at rest. HE is not sure what it is.  Feels he needs to take a deep breath on occasion. So, now referred to cardiology and pulmonary. Says ICS Qvar in Samuel [name]/sept 2012 made it worse. Outside chart mentions asthma but patient says not sure if he has asthma. But reports childhood hx of asthma diagnosed by a pediatrician at age 58. Says he had similar symptoms.  Up until 1990 used to 10K but even back then had similar symptoms and would need to warm up before feeling better. Then stopped running due to neck problems. Similar during swimming exercise in 1996-1997. Quit swimming 1999. Episodes associated with wheezing but no cough. Unclear if albuterol helps but on xopenex prn esp before walking. This whole thing feels like he is not getting "enough juice". Never had PFT but recollects normal spirometry at Samuel [name] [name] office.  He has seen Samuel [name] [name] cardiology for above - treadmill and echo and carotid doppler all pending. Current pulmonary modulating drug is Singulair only  Chest x-ray 07/16/2011 is clear per my personal review.  xxxxxxxxxxxxxxxxxxxxxxxxxxxxxxxxxxxxxxxxxxxxxxxxxxxxxxxxxxxxxxxxxx  Inpatient consult 06/16/21 68 y/o male with a history of multiple myeloma diagnosed in 2022, currently undergoing treatment presented to the [facility] emergency department from his hematology oncology appointment with a chief complaint of dyspnea.  The patient smoked cigarettes briefly as a young adult but since then has lived a relatively  healthy lifestyle staying active with exercise and playing golf.  He was diagnosed with multiple myeloma in the spring 2022 when a bruise was discovered while he was receiving a sports massage.  He went for further work-up and was found to have abnormal lab work, this led to a bone marrow biopsy confirming diagnosis of multiple myeloma.  He has been followed by Samuel. Leonides [name] who has been treating him with VRd chemotherapy since Mar 16, 2021.  He is currently in the middle of his fifth cycle.     Over the last several weeks he has been developing progressive shortness of breath with exertion.  He says this has been going on for about 3 to 4 weeks and has significantly worsened in the last week.  This is associated with a dry, rare cough.  He denies fevers, chills, leg swelling, weight gain, or mucus production.  In the last several days (just prior to admission) he went [place] the [place] and while there felt significant shortness of breath so he presented to Samuel. Derek [name] office for further evaluation.  He was sent to the emergency room for evaluation further because of the severity of his shortness of breath.  In the emergency department he was noted to have hypoxemia to an O2 saturation of 87% and an abnormal chest exam.  Pulmonary and critical care medicine was consulted for further evaluation.  He says that throughout his adult life he has had what he calls "huffing" when he talks while exerting himself or with exertion.  He says he has been followed by an allergist and has been treated for asthma at times  over the years.  He does not regularly use an inhaler and he says this is not something that he routinely needs.  He was seen by my partner Samuel. Marchelle [name] at 1 point in 2013 during which time he had a normal chest x-ray and normal lung function testing.   He had COVID in April 2022.   Samuel [name] 26, 2022 admission Samuel [name] 26 CT chest images independently reviewed: No pulmonary embolism, diffuse interlobular septal  thickening, pleural nodularity noted, areas of groundglass opacification with some patchy distribution particularly in the lung base, no significant pleural effusion, mild reactive appearing lymphadenopathy in the mediastinum Acute respiratory failure with hypoxemia in the setting of diffuse parenchymal lung disease of undetermined etiology: This is somewhat of a complex case given his underlying asthma and the fact that he had COVID in 2022 and in April 2022 a CT scan of his abdomen showed some ill-defined interstitial changes in the periphery of his lungs on lung windows in the bases.  Based on the time course of his illness and reports in the literature I am most concerned about Samuel [name] [name] lung toxicity (see citation below), though it is also important to evaluate for another underlying and progressive primary pulmonary ILD or less likely an opportunistic infection (history doesn't seem to support this).  Samuel [name] [name] can cause eosinophilic pneumonia which we can assess for with a bronchoscopy, but his imaging characteristics aren't typical for this.  It is possible that the non-specific interstitial changes seen on his lungs in April 2022 were related to his recent COVID infection.  I don't think his baseline asthma explains his symptoms, though he did have a high serum eosinophil count a few weeks prior to admission (unclear significance).  Samuel [name] [name], Samuel [name] [name], Samuel [name] [name], Samuel [name] [name], Samuel [name] [name], Samuel [name] [name], Samuel [name] [name], Samuel [name] [name], Samuel [name] [name], Samuel [name] [name]. Bortezomib induced pulmonary toxicity: a case report and review of the literature. Am J Blood Res. 2020 Dec 15;10(6):407-415. PMID: 30865784; PMCID: [id number].  06/18/21 -   Results for Samuel [name] [name]" (MRN [id number]) as of 07/12/2021 09:13  Ref. Range 06/18/2021 12:57  Monocyte-Macrophage-Serous Fluid Latest Ref Range: 50 - 90 % 5 ([name])  Other Cells, Fluid Latest Units: % CORRELATE WITH CYTOLOGY.  Color, Fluid Latest Ref Range: YELLOW  PINK (A)  Total  Nucleated Cell Count, Fluid Latest Ref Range: 0 - 1,000 cu mm 103  Fluid Type-FCT Unknown BRONCHIAL ALVEOLAR LAVAGE  Lymphs, Fluid Latest Units: % 18  Eos, Fluid Latest Units: % 51  Appearance, Fluid Latest Ref Range: CLEAR  HAZY (A)  Neutrophil Count, Fluid Latest Ref Range: 0 - 25 % 26 ([name])  FINAL MICROSCOPIC DIAGNOSIS:  - No malignant cells identified  - Benign bronchial cells and pulmonary macrophages   Titrate off solumedrol and start prednisone 50 mg daily. Decrease by 10 mg weekly    OV 07/12/2021  Subjective:  Patient ID: Samuel [name] [name], male , DOB: 1956-05-12 , age 47 y.o. , MRN: [id number] , ADDRESS: [address] [place] [address] PCP Tally Joe, MD Patient Care Team: Tally Joe, MD as PCP - General (Family Medicine)  This Provider for this visit: Treatment Team:  Attending Provider: Kalman Shan, MD    07/12/2021 -   Chief Complaint  Patient presents with   Hospitalization Follow-up    Pt states he was doing better after being out of the hospital after being placed on steroids. States about a week after being out, states he started having more problems with  SOB and to today, he has had problems with SOB with exertion.   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 -presents for follow-up from the hospital.  I originally met him 10 years ago and then at that point in time he had dyspnea.  He tells me that after that he was exercising and living a good life.  Then in April 2022 got diagnosed incidentally with myeloma multiple.  Says he was caught early.  Then in May 2022 started on Samuel [name] [name] and Samuel [name] [name].  He says he was doing well with this up until mid July.  Up until then he was walking 1-1/2 2 miles per day 5 days a week.  Then in late July 2022 started noticing shortness of breath.  He met Samuel. Leonides [name] his oncologist on Samuel [name] 5, 2022.  By Samuel [name] 12, 2022 he had severe pulmonary  infiltrates.  He says a part of then he was getting his Samuel [name] [name] once a week along with some steroids with each dose.  At this point in time the steroid dose was cut down but he progressed and started getting worse.  Then on Samuel [name] 26, 2022 he was in significant respiratory distress and admitted to the hospital.  I reviewed the hospital records.  He was there for 3 days.  He underwent bronchoscopy with BAL there is also 51% eosinophilia.  Cultures are negative malignant cells are negative.  He was seen by my pulmonary colleagues.  Discussion was held with the Samuel [name] [name] other Samuel [name] [name] was the etiology.  Given the use and failure it looks like his Samuel [name] [name] has been held.  He was discharged on 50 mg prednisone with advised to taper it by 10 mg/week.  Currently is on 20 mg prednisone.  He started playing golf again and feeling really good through Labor Day weekend..  Then on June 29, 2021 his Samuel [name] [name] was not given.  Up until this point all chemo was held.  He was given Cytoxan, Zomenta and Samuel [name] [name].  The very next in June 30, 2021 he started feeling worse.  He feels Samuel [name] [name] is the etiology for this.  On July 06, 2021 he communicated this with Samuel. Leonides [name] and all chemo has been held.  He is being referred to the bone marrow transplant program at [facility] for further evaluation.  At this point in time he says he is much better than when he was in the hospital but definitely not as good as he was prior to getting a rechallenge with Samuel [name] [name] on June 29, 2021.  But he notices that he slowly improving.  Walking desaturation test shows that his pulse ox is holding up although he does have a tendency to desaturate.   Did extensive review of the literature.  According to up-to-date Samuel [name] [name] is a known etiology for pulm eosinophilia but he got worse after getting rechallenged with Samuel [name] [name].  Review of the literature shows that Samuel [name] [name] can cause drug-induced pneumonitis and a small fraction of patients.  The  literature is  NO INFOR  as to whether these patients have eosinophilia or not but definitely they seem to be steroid responsive.  This literature is attached below.  In the timeframe also fits in with Samuel [name] [name].  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  He currently has significant symptoms of shortness of breath.  He does not have oxygen with him at home.  Simple office walk 185 feet x  3 laps goal with forehead probe 07/12/2021    O2 used ra   Number laps completed 3   Comments about pace slow   Resting Pulse Ox/HR 100% and 69/min   Final Pulse Ox/HR 92% and 81/min   Desaturated </= 88% no   Desaturated <= 3% points Yes 8   Got Tachycardic >/= 90/min no   Symptoms at end of test No complaints   Miscellaneous comments x        Causes of Pulmonary Eosinophilia: from UPTODATE A. Nonsteroidal antiinflammatory drugs (NSAIDs) [name]. Nitrofurantoin  C. Minocycline D. Sulfonamides/ Sulfamethoxazole E. Ampicillin F. Daptomycin G.Vancomycin [name]. Dapsone I. Beta-lactam antibiotics J. Nevirapine K. Telaprevir [name]. Sulfasalazine [name]. Methotrexate [name]. Mesalamine O. Amiodarone P. Bleomycin Q. ACE Inhibitor R. Beta blocker [name]. Hydrochlorothiazides T. [name]- Tryptophan U. Allopurinol V. Carbamazepine W. Lamotrigine X. Phenytoin Y. Phenindione Z. Fluindione AA. Olanzapine AB. Oxcarbazepine AC. Strontium Ranelate AD. Lenalidomide AE. Radiographic contrast media  Samuel [name] [name], Kalyon [name], [place] [name], Samuel [name] [name], Samuel [name] [name], Samuel [name] [name], [place], Samuel [name] [name], Samuel [name] [name], [place], [place] [name]. Bortezomib induced pulmonary toxicity: a case report and review of the literature. Am J Blood Res. 2020 Dec 15;10(6):407-415. PMID: 30865784; PMCID: [id number]. - Bortezomib  related pulmonary toxicities are rarely reported. Although the incidence of Bortezomib induced lung injury (BLI) is unknown, in a large registry study of 1010 MM patients, 45 patients were reported to have BLI by their physician. However, the causality could only be constituted in 26 patients (2.6%), with 5 of them resulting in death despite steroid treatment.In the case reports, the average number of RVD cycles until the toxicity was presented was 6.9, and the period between the development of pulmonary toxicity and the first dose of Bortezomib was 31.1 days -No mention of eosinophilia  Bortezomib therapy-related lung disease in [place] patients with multiple myeloma: Incidence, mortality and clinical characterization Charolette Child [facility] Miyao,2 Milligan Masahiko Kusumoto,5 [place] Fumikazu LKGMW,1 Ashton Sugiyama,8 Kiyohiko Hatake,9 Jenelle Mages Fukuda,10 and Genene Churn [id number]  - Of the 1010 patients registered, 45 (4.5%) developed BILD, 5 (0.50%) of whom had fatal cases. The median time to BILD onset from the first bortezomib dose was 14.5 days, and most of the patients responded well to corticosteroid therapy. A retrospective review by the Lung Injury Medical Expert Panel revealed that the types with capillary leak syndrome and hypoxia without infiltrative shadows were uniquely and frequently observed in patients with BIL - no mention of eosinophilia  [email or web address].com/ =- allopurinol complex multisystem Hypersensitivityey   has a past medical history of Asthma, High cholesterol, Perennial allergic rhinitis, Sciatic pain, right, Seasonal allergic rhinitis, and Thyroid disease.   reports that he quit smoki     07/20/2021  - Visit, NP Cherre Huger    68 year old male former smoker followed in our office for shortness of breath and interstitial lung disease.  Established with Samuel. Marchelle [name].  Initially  consulted when inpatient with our team in Samuel [name]/2022.  Chief complaint at that point time was dyspnea.  He was diagnosed with multiple myeloma in the spring 2022 when a bruise was discovered while he was recovering from a sports massage.  He went for further work-up and he was found to have abnormal lab work this led to a bone marrow  biopsy confirming diagnosis of multiple myeloma.  He has been followed by Samuel. Leonides [name].  He was in the middle of treating him with VRD chemotherapy since May/20 04/2021.  He was in the middle of his fifth cycle.  Patient also had COVID in April/2022.  Chest CT in April/2022 showed ill-defined interstitial changes in the periphery.  Concern for Samuel [name] [name] lung toxicity.  Patient also had a bronchoscopy performed in 06/18/2021.  Eosinophils 51%, lymphs 18%, no malignant cells identified patient was started on a long-term prednisone taper.  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  Still currently having significant symptoms of shortness of breath.    Last seen in our office on 07/12/2021 by Samuel. Marchelle [name].  Plan of care at that office visit was as follows: Stop Samuel [name] [name], check chest x-ray, check CBC with differential, blood ESR, blood IgE, vitamin D, hemoglobin A1c, G6PD, walk test, check Ono at home, change prednisone to 50 mg daily for 2 weeks, then 40 mg daily for 2 weeks, then 30 mg daily for 2 weeks, then 20 mg daily for 2 weeks, 2-week follow-up with APP or Samuel. Marchelle [name], 4-week follow-up with Samuel. Marchelle [name] and 30-minute time slot.  Per chart review on 07/13/2021 Samuel [name] [name] was discontinued.  Patient was also seen by Samuel. Leonides [name] on 07/18/2021 I am unable to view this note.  Patient reporting that weight team would like for him to resume Samuel [name] [name] at a maintenance dose of 10 mg.  This is the current plan.  He will see oncology next Friday for blood work.  Patient is scheduled to complete an overnight oximetry test next Tuesday.  He remains  adherent to his prednisone taper 50 mg daily.  Patient reports that this weekend he did play 9 holes of golf on Saturday and on Sunday.  He was limited by his physical exertion.  He also exercised on Tuesday and worked out with a Psychologist, educational.  Patient is eager to get back to baseline physical activity.  Patient reports that on Wednesday (07/18/2021) of this week he had increased shortness of breath, cough, worsened acid reflux and he vomited.  Patient believes that this may also be due to the fact that he ate a dinner meal quite quickly.  This sometimes happens when he does this.  He also reports that his blood pressure was high at that time.  Patient reports that he has been off the Samuel [name] [name] for at least 1 month.  Patient has stopped his allopurinol as of 07/18/2021.  Patient and spouse are both frustrated regarding dyspnea and have hopes that he would be improving quicker.  There are also concerns that he may have acute worsened symptoms or an acute infection such as bronchitis.  We will discuss and evaluate for this today.  Walk today in office: 07/20/2021-completed 2 laps on room air, dropped to 93%  07/26/2021- Interim hx  Patient presents today for 1 week follow-up. ILD felt to be related to drug pneumonitis from Samuel [name] [name] as well as Samuel [name] [name] for his treatment of multiple myeloma. BAL showed significant eosinophilia. Samuel. Leonides [name] lowered dose of Samuel [name] [name] to 10mg  daily. CXR on 07/20/21 showed chronic ILD, no definite acute findings. Ambulatory walk during his last visit showed no oxygen desaturations. During his last visit he was ordered for HRCT, PFTs and ONO.   Accompanied by his wife. He had a bad weekend, his respiratory symptoms have been slightly better the last two days. He is currently off BOTH Samuel [name] [name] and Samuel [name] [name] (may retry Samuel [name] [name]  at lower dose). He stopped using BREO d/t throat irritation. Xanax has helped relieves some anxiety and chest tightness. Wife reports that he is sleeping better. He in  on prolonged prednisone taper. He will be starting 40mg  prednisone tomorrow  x 2 weeks. He is taking Singulair and generic fluticasone nasal spray. He has been off allergy shots since Samuel [name]. HRCT and PFTs are scheduled for next week. Awaiting results to be faxed for ONO from [employer].    OV 08/02/2021 -   Subjective:  Patient ID: Samuel [name] [name], male , DOB: 16-Jun-1956 , age 74 y.o. , MRN: [id number] , ADDRESS: [address] [place] [address] PCP Tally Joe, MD Patient Care Team: Tally Joe, MD as PCP - General (Family Medicine)  This Provider for this visit: Treatment Team:  Attending Provider: Kalman Shan, MD  Type of visit: Telephone/Video Circumstance: COVID-19 national emergency Identification of patient Samuel [name] [name] with 02-27-1956 and MRN [id number] - 2 person identifier Risks: Risks, benefits, limitations of telephone visit explained. 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 CT abdomen/pelvis: Multiple small lytic lesions in the thoracolumbar spine and bilateral pelvis -SPEP: IgG 2,082 ([name]), [name] Protein 1.8 ([name]). IFE shows IgG monoclonal protein with kappa light chain specificity.  -LDH 169, CBC normal, CMP normal except for sodium 131 ([name]), Chloride 96 ([name]).   02/14/2021: Establish care with Georga Kaufmann PA-C 02/22/2021: bone marrow biopsy confirms the diagnosis of Multiple Myeloma with a monoclonal plasma cell population.  03/16/2021: Cycle 1 Day 1 of VRd chemotherapy  04/06/2021: Cycle 2 Day 1 of VRd chemotherapy  04/27/2021: Cycle 3 Day 1 of VRd chemotherapy  05/18/2021: Cycle 4 Day 1 of VRd chemotherapy  06/01/2021: drop  dexamethasone to 20mg  PO weekly and start lasix due to shortness of breath.  06/15/2021: Desaturation to 87% on ambulation. HELD Samuel [name] [name] today and sent to ED for evaluation.  06/22/2021: Findings consistent with drug reaction the lungs with eosinophils on BAL.  Given these findings we will definitely hold Samuel [name] [name] and plan to avoid pomalidomide 06/29/2021: Cycle 1 Day 1 CyBorD chemotherapy   HPI Samuel [name] [name] 68 y.o. -there is a telephone visit.  He is supposed to see me next week but he had a CT scan of the chest 2 days ago and had pulmonary function test today.  He really wanted to discuss the results today itself.  Review of the records indicate that he still on prednisone taper.  He is able to ambulate and desaturated only after 200 feet.  There are some mild nocturnal desaturation.  We started 2 [name] of nasal cannula oxygen.  His CT scan of the chest shows diffuse groundglass opacity in a pattern that is inconsistent with UIP [less than 40% chance this is UIP] suggestive of alternate pattern.  He says he is able to do weight training exercises well but when he walks on a treadmill or does ambulation that is when it bothers him.  When he rests he is better.  He does desaturate to 80s percent.  He is wondering if this could be from asthma.  I told him otherwise.  Touch base with Samuel. Leonides [name] his oncologist.  He is scheduled  for cyclophosphamide tomorrow along with low-dose Samuel [name] [name].  This was held off recently in September 2022.  But oncology is wanting to rechallenge him with low-dose Samuel [name] [name].  They wanted approval for this.   ONO RA 07/24/21   - - 45 min spent <88%. 7+ hours was > 90%. OVerall not bad  Plan  Start 2L [place] QHS   CT Chest data  CT Chest High Resolution  Result Date: 07/31/2021 CLINICAL DATA:  Evaluate for interstitial lung disease EXAM: CT CHEST WITHOUT CONTRAST TECHNIQUE: Multidetector CT imaging of the chest was performed following the standard protocol without intravenous  contrast. High resolution imaging of the lungs, as well as inspiratory and expiratory imaging, was performed. COMPARISON:  Chest CT dated Samuel [name] 26, 2022; abdomen and pelvis CT dated February 02, 2021 FINDINGS: Cardiovascular: Cardiomegaly with trace pericardial effusion. Coronary artery calcifications of the RCA and LAD. Atherosclerotic disease of the thoracic aorta. Mediastinum/Nodes: Esophagus is unremarkable. Atrophic thyroid. Mediastinal lymph nodes are decreased in size when compared with prior exam. Reference AP window lymph node on series 2, image 42 measures 1.1 cm in short axis, previously 1.3 cm. Lungs/Pleura: Central airways are patent. Images are motion degraded. Mild diffuse ground-glass opacity with peribronchovascular and subpleural reticular glass opacities and traction bronchiectasis. No clear craniocaudal predominance. Mild bilateral air trapping. 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 Stable solid right middle lobe pulmonary nodule measuring 3 mm on series 3, image 57. Upper Abdomen: No acute abnormality. Musculoskeletal: No chest wall mass or suspicious bone lesions identified. IMPRESSION: Limited evaluation due to respiratory motion artifact. Within limitations, there are diffuse bilateral ground-glass opacities with peribronchovascular and subpleural reticular opacities, traction bronchiectasis and no clear craniocaudal predominance. Differential considerations include sequela of acute lung injury, NSIP, or fibrotic HP given presence of air trapping. Mild subpleural reticular opacities were present on visualized portions of the lung on prior abdomen and pelvis CT dated February 02, 2021, although majority of findings are new. Findings are suggestive of an alternative diagnosis (not UIP) per consensus guidelines: Diagnosis of Idiopathic Pulmonary Fibrosis: An Official ATS/ERS/JRS/ALAT Clinical Practice Guideline. Am Rosezetta Schlatter Crit Care Med Vol 198, Iss 5, [phone], Jun 21 2017.  Small solid pulmonary nodule the right middle lobe measuring 3 mm. No follow-up needed if patient is low-risk. Non-contrast chest CT can be considered in 12 months if patient is high-risk. This recommendation follows the consensus statement: Guidelines for Management of Incidental Pulmonary Nodules Detected on CT Images: From the Fleischner Society 2017; Radiology 2017; 284:228-243. Aortic Atherosclerosis (ICD10-I70.0). Electronically Signed   By: Allegra Lai [name].D.   On: 07/31/2021 12:01         08/16/2021 -  fu drug induced pneumonitis   HPI Samuel [name] [name] 68 y.o. - desaturated. STarted on portable o2 since yesterday and Is feeling better. On Room air say he is desaturated.  Called to discuss Bronch . He is scheduled to see Samuel PAtwardhan 08/22/21 wed at 8.30am. PRefers not to have bronch done at that that tme.  Discussed the consensus about having bronchoscopy with lavage to rule out any opportunistic infections.  At this time I took the opportunity of also recommending transbronchial biopsies.  He did have transbronchial biopsies when he was a lot more hypoxemic in Samuel [name] 2022 we will send for histopathology and was nondiagnostic.  This time I recommended we send it off for RNA genomic classifier for UIP it is not a sensitive test but it is specific.  If it comes back  positive then we would know that there is permanency to this and also its a marker of progression potentially.  He is willing to go through this.  Explained we under general anesthesia. Based on schedule will be myself or Samuel. Tonia Brooms doing it.  Explained the following risks   Risks of pneumothorax, hemothorax, sedation/anesthesia complications such as cardiac or respiratory arrest or hypotension, stroke and bleeding all explained. Benefits of diagnosis but limitations of non-diagnosis also explained. Patient verbalized understanding and wished to proceed.    We then discussed pulse dose steroids.  Told him we will have to wait  close to a week to make sure there is fungal smears PCP and AFB smears and bacterial culture negative.  At that point we will have to take a decision on giving 1 g Solu-Medrol for the a day for 3 days.  Ideally would need admission he does not want this.  He wants to do with is on an outpatient setting ideally.  Explained to him about the risks with steroids such as hyperglycemia, hypertension but said that I would try to work with the DME company or outpatient/home nursing to see if this would be possible.  He was appreciative.  Also discussed with Samuel. Tonia Brooms who is willing to do the biopsy based on schedule of the operating room, myself and the patient if things do not work out.  Sent a secure chat to Samuel. Rosemary Holms.  Told him about the patient.  He will give [place] a clearance on 08/22/2021.  Recommended also look for BNP and heart failure.  CT Chest data  No results found.   OV 08/09/2021  Subjective:  Patient ID: Samuel [name] [name], male , DOB: 1956/02/12 , age 35 y.o. , MRN: [id number] , ADDRESS: [address] [place] [address] PCP Tally Joe, MD Patient Care Team: Tally Joe, MD as PCP - General (Family Medicine)  This Provider for this visit: Treatment Team:  Attending Provider: Kalman Shan, MD    08/09/2021 -   Chief Complaint  Patient presents with   Follow-up    Pt states he is about the same since last visit, maybe a little better. Pt is coughing more at times and states he does cough a lot before bed.   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 -returns for follow-up.  He is finishing up 40 mg of prednisone per day.  Restarting 30 mg/day of prednisone tomorrow.  He is not really better.  Today he was able to only walk 2 out of the customary 3 laps.  And he showed a tendency to desaturate.  He says he is extremely anxious.  This is understandable.  He also states  that when he lifts weights in the gym he does not have a problem but when he exerts himself he feels worse.  Previously his nocturnal desaturation test showed abnormality and we recommended night oxygen but he declined per the CMA.  But today he and his wife tell me that they would be interested in getting some oxygen if it would help with shortness of breath.  Simple walking desaturation test does not make him qualify and will need a 6-minute walk test to qualify for portable oxygen.  They are also worried about the lack of improvement.  They are wondering about second opinion.  I did unofficially check with some of my colleagues in the [place].  No clear-cut plan has been  developed.  We discussed about the possibility of visiting Samuel. Marcie Bal, ILD group in [place].  They seemed enthused about the idea but wanted me to reach out to Samuel. Harrold Donath first.  In terms of his myeloma review of the medical records from office visit 08/03/2021 with Samuel. Leonides [name] and talking to the patient and the labs show the myeloma still in remission.  But Samuel. Leonides [name] is extremely concerned that the myeloma will come back.  Oncology still wants to rechallenge him with Samuel [name] [name] but after my personal discussion with Samuel. Leonides [name] on 08/02/2021 and concern for pulmonary toxicity chemotherapy is on hold till patient recovers.  He is also concerned about the presence of coronary artery calcification on his recent CT scan of the chest.  His Samuel [name] had coronary artery disease diagnosed at 84 and had bypass.  He wants to visit this with Samuel. Jacinto Halim again       08/31/2021 -video visit to discuss bronchoscopy results from 08/28/2021 and next step in plan    HPI Samuel [name] [name] 68 y.o. -his bronchoscopy with lavage 08/28/2021 shows 40% lymphocytes.  Anything greater than 30% is against UIP.  His RNA genomic classifier is in progress and that would be a specific test although not a ensitive test for UIP.  Bacterial cultures are  negative.  He continues have shortness of breath and on room air at rest he is fine but sometimes when he goes to the bathroom he can desaturate into the high 80s.  He is frustrated by his condition.  Our original plan was to ensure no opportunistic or bacterial infections [fully understanding that MTB and fungal infections can take 6 weeks] with the current bronchoscopy and to consider bronchoalveolar lavage.  And then based on this we will schedule pulsed dose steroids.    Recently his G6PD is returned is normal.  He is not on Bactrim for prophylaxis.  Current prednisone Is 20mg  per day.  He prefers outpatient treatment plan.  We discussed the side effects of high-dose steroids including opportunistic infection, anxiety and irritability, hypertension, diabetes, other lab abnormalities.  Explained the upset benefit of potentially improving upon current ILD active inflammatory phase.  He is willing to go through this treatment.  He prefers outpatient.  He will require lab and vital sign monitoring.   his wife wanted to know if this could be sarcoidosis.  She is read some case reports of sarcoidosis and myeloma associated.  I expressed to them the clinical features do not fit in with sarcoidosis.  However we can check for autoimmune and sarcoid features  Results for ROMYN, BOSWELL" (MRN [id number]) as of 08/31/2021 16:38 ENVISIA - NEGATIVE FOR UIP   Ref. Range 06/18/2021 12:57 08/28/2021 16:51  Monocyte-Macrophage-Serous Fluid Latest Ref Range: 50 - 90 % 5 ([name]) 10 ([name])  Other Cells, Fluid Latest Units: % CORRELATE WITH CYTOLOGY. MESOTHELIAL AND BRONCHIAL LINING CELLS  Color, Fluid Latest Ref Range: YELLOW  PINK (A) COLORLESS (A)  Total Nucleated Cell Count, Fluid Latest Ref Range: 0 - 1,000 cu mm 103 183  Fluid Type-FCT Unknown BRONCHIAL ALVEOLAR LAVAGE BRONCHIAL ALVEOLAR LAVAGE  Lymphs, Fluid Latest Units: % 18 40  Eos, Fluid Latest Units: % 51 0  Appearance, Fluid Latest Ref Range: CLEAR  HAZY (A)  CLEAR (A)  Neutrophil Count, Fluid Latest Ref Range: 0 - 25 % 26 ([name]) 50 ([name])    CT Chest data  DG CHEST PORT 1 VIEW  Result Date: 08/28/2021 CLINICAL DATA:  Status  post bronchoscopy. EXAM: PORTABLE CHEST 1 VIEW COMPARISON:  Chest radiographs 07/20/2021 and CT 07/30/2021 FINDINGS: The cardiac silhouette is borderline enlarged. Lung volumes are chronically low and slightly lower than on the prior radiographs. The interstitial markings are chronically increased diffusely. No definite acute airspace consolidation, overt pulmonary edema, sizable pleural effusion, or pneumothorax is identified. Prominent gaseous distension of the stomach is partially visualized. IMPRESSION: Low lung volumes with chronic interstitial changes. Electronically Signed   By: Sebastian Ache [name].D.   On: 08/28/2021 19:10   DG C-ARM BRONCHOSCOPY  Result Date: 08/28/2021 C-ARM BRONCHOSCOPY: Fluoroscopy was utilized by the requesting physician.  No radiographic interpretation.       OV 09/25/2021  Subjective:  Patient ID: Samuel [name] [name], male , DOB: [birth year] , age 64 y.o. , MRN: [id number] , ADDRESS: [address] [place] [address] PCP Tally Joe, MD Patient Care Team: Tally Joe, MD as PCP - General (Family Medicine)  This Provider for this visit: Treatment Team:  Attending Provider: Kalman Shan, MD    09/25/2021 -   Chief Complaint  Patient presents with   Follow-up    Pt states that he is beginning to feel better after last visit. States he wears his O2 at 2L majority of the time.  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 Jeanie Sewer - 40mg  once weekly (for multiple myeloma chemotherapy [name]/V ppx)   04/09/21 - dexamethasone 4mg  tabs - 40  tabs for 28 day supply - Samuel. Jeanie Sewer - 40mg  once weekly  (for multiple myeloma chemotherapy [name]/V ppx)   05/07/21 - dexamethasone 4mg  tabs - 40 tabs for 28 day supply - Samuel. Jeanie Sewer - 40mg  once weekly  (for multiple myeloma chemotherapy [name]/V ppx)   06/06/21 - dexamethasone 4mg  tabs - 40 tabs for 28 day supply - Samuel. Jeanie Sewer - decreased to 20mg  once weekly  06/18/21 - prednisone 10mg  tabs - 104 tabs for 34 day supply - Samuel. Noralee Stain ------50mg  daily x 7 days, 40 mg daily x 7 days, 30 mg daily x 7 days, 20mg  daily x 7 days, 10mg  daily x 7 days, 5mg  daily x 7 days   07/13/21 - prednisone 10mg  tabs - 296 tab for 30 day supply - Samuel. Marchelle [name] ------50mg  daily x 14 days, 40 mg daily x 14 days, 30mg  daily x 14 days, 20mg  daily thereafter  - Mid Nov 2022  - 1gm solumedrol load x 3 days as outpatient  - 09/25/2021 - 20mg  pred per day   HPI Samuel [name] [name] 68 y.o. -returns for follow-up.  Since his last visit we did a loading dose of Solu-Medrol 1 g daily x3 days.  This was in mid November 2022.  After that he is gone back to daily prednisone 20 mg/day.  In the midst of the high-dose steroid he did pick up hypertension and we gave him bisoprolol which he says has helped him significantly.  He is run out of the bisoprolol.  I have asked him to contact his primary care physician to manage his hypertension but we will give him a refill.    He is here for follow-up to see his current status.  He tells me that his effort tolerance is better.  He tells me in the gym he is able to do a little bit more work.  This is compared to a few months ago.  He does tell me that gym exercises are easier on him than climbing the stairs.  Stairs - he avoids and gets dyspneic. Not tested his pulse ox on stairs His subjective symptom profile is slightly better compared to October 2022 but his walking desaturation test is around the same.   So suspect amount of his interstitial lung disease might be better but suspect  still remains.  Review of his pulmonary function test from 10 years ago was normal.  In April 2022 he had early ILD.  He currently definitely has ILD.  His RNA genomic classifier is negative.  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  This would make him eligible for nintedanib. He continues on prednisone 20 mg/day.  In terms of his myeloma: He had his wife say that it is still under remission but they are worried about relapse.  They are worried about future direction and treatment of myeloma particularly because he has had issues with treatment that then resulted in acute lung injury.   Of note  - he is frustrated by poor customer service of our office workflows - Rosann Auerbach denies his RNA genomic classifer biopsy    OV 10/19/21  [name]: call to give update on conversation with Samuel [name] [name] his hematologist  1. Myeloma - latest dec 2022 blood work back - still under remission. Samuel [name] [name] indicated that highly unlikely he will be a BMT Candidate for myeloma if his lung. Has appt pending with [facility]. If not a BMT candidate - then cytoxan regimen short term would be used (indicated that is of ptioential benefit to lungs)  2. 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 Would need SLB to ID etiology preciesly but with myelom and Thereapuetic trial with steroids  and his presentation - this has not been a consideratgion till now.  Explained to Samuel [name] [name] if ILD progresses futher - patient life expectancy is limited. Discussed with patient again and he is agreeable for prednisone 15mg  per day and starting ofev (awaiting donor samples and insurance proces in 2023).   3. WE discussed that probably best to refer to [facility]  trans[facility] and hematology to see if lung transplant would be an option at all if he were to decline esp in setting of myeloma. Maybe a BMT as well . Do not know answers but wil email Samuel Thad Ranger at [facility] to get patient in for visit. He might well need a surgical lung biopsy but this can be addressed in due course  Patient and wife agreeable  I spent time emailing Samuel Acquanetta Belling trasnplant doc at [facility] - later heard from Samuel Thad Ranger- feels that Myleoma will need to be in remssion for >= 5 years before he can be considered lung transplant evaluable. They feel no need to see Mr Holtman in transplant clinicl. I subsequetly d/w Mr Steiner - will revert back to holding off lung transplant evaluation. He wil proceed with ofev. He will see [facility] BMT team. Will cosndier a [facility] ILD clinic opinion after d;w hi  2nd Pulm Opinion at [facility] - Samuel Sharmon Revere    Comment: The patient seems to have an inflammatory process that has resulted in progressive loss of lung function, worsening hypoxia. Unfortunately on the most recent imaging I do see some signs of fibrosis including a few areas of traction bronchiectasis although I do not see honeycombing or profuse traction bronchiectasis. We did discuss future therapies. I told him that unfortunately since his lung has already suffered injury I think he would be at risk for developing lung injury again and that many chemotherapeutics have been associated lung injury. I also told him these are idiosyncratic reactions and it was not possible to predict who on an individual basis would develop inflammation from chemotherapy to any specific agent. He brought up Cytoxan I told him that while Cytoxan issues for inflammatory lung disease on the other hand it has been associated with pulmonary inflammation. His inflammatory process is steroid responsive which is not unexpected given that eosinophils were found on BAL.  We will check oxygen assessment with exercise to  see how much oxygen as needed with more than ordinary exertion.  Plan:    - Check oxygen assessment  Will discuss with his pulmonary provider Samuel. Marchelle [name] and then Samuel. Anne Hahn  Thank you for the opportunity to provide consultation for your patient. If I can be of further assistance please do not hesitate to contact my office.   [facility]   ASSESSMENT / PLAN  1. Interstitial lung disease-severe DLCO reduction (33% predicted) 2. 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 Samuel [name] [name] induced)-started Samuel [name] 2022 3. Faint/early ILD present in April 2022 (even prior to starting myeloma treatment) 4. Hypothyroidism-on replacement with Armour thyroid 5. Dyslipidemia 6. History of gout 7. Chronic allergic rhinitis and some sinusitis-on nasal steroids 8. Childhood history of asthma 9. 25 year history of allergy shots (between ages 59 and 57)  35. Multiple myeloma-diagnosed April 2022-received about 5 cycles of Samuel [name] [name]/Samuel [name] [name]/dexamethasone until Samuel [name] 2022    There definitely appeared to be some very early changes of ILD even back in April 2022 prior to him starting any of his myeloma treatments. However, there definitely appears to be an acute interstitial pneumonitis type picture noted on his CT scan from 06/15/2021. He was taken off all myeloma treatments at that time with concern for drug-induced pneumonitis with both Samuel [name] [name] as well as Samuel [name] [name] being considered probably agents. He subsequently underwent 1 additional cycle of myeloma treatment with Samuel [name] [name] alone (without Samuel [name] [name]) and had an acute exacerbation of dyspnea symptoms and ever since then has not received any further myeloma treatment.   I discussed with him that he has been on steroids since about Samuel [name] 2022 and received a steroid bolus in November 2022 with very little change overall in his lung functions. I have mentioned his serial lung function numbers in the HPI above. I do not see any significant change in  these numbers despite him receiving excellent care with Samuel. Marchelle [name] in [place].   His CT chest currently shows mostly fibrotic changes with maybe some active inflammatory component still left (although the extent of active inflammation appears to be very little as compared to the initial scan from Samuel [name] 2022).   I discussed with him that we have very little in terms of treatments to offer him. I do not think the addition of an IL 5 inhibitor or dupilumab would be of any benefit in this situation given that his BAL eosinophil count in November was down to 0%  on steroids along with the fact that his current CT scan does not show much for active inflammation. Samuel. Marchelle [name] had discussed Cytoxan with him, but I think that that would be somewhat aggressive given that he is already got an active bone marrow problem with the myeloma and so I would discourage him from getting Cytoxan.  In terms of treatment options, we are really left with only 1 option which is to rechallenge him with high-dose steroids for the next 6-8 weeks and then reassess both his CT scan as well as pulmonary function studies. With this in mind, I have the following plan for him:  1. Increase oral prednisone from his 10 mg a day that he has been currently using for the past month or so to 60 mg a day. I have outlined to taper for him on the prescription and he will see me back on the 30th of May when he is down to 50 mg a day of prednisone.   2. Continue Bactrim PCP prophylaxis. I have refilled the prescription for him.   3. Continue oxygen supplementation  4. Await the recommendations of our myeloma colleagues in Hematology  5. We cannot refer him for lung transplantation given the active diagnosis of multiple myeloma. This was discussed with him.  Overall, I told him that we likely are dealing with end-stage interstitial lung disease with very little in terms of active inflammation and something that is reversible even with  the prednisone challenge I have outlined. He was given a prescription for Ofev by Samuel. Marchelle [name], but could not tolerate that due to diarrhea, nausea as well as vomiting. He has an active prescription for Esbriet, but I told him to hold off on that for the next 8 weeks while he is undergoing the prednisone trial so that we do not have any issues with him throwing up on the prednisone and potentially confusing the assessment of treatment response.  If we do not see any improvement in his lung function on CT scan on May 30th, we will slowly taper and discontinue the prednisone and have him start the Esbriet.  With regards to multiple myeloma treatment going forward, I would definitely not challenge him with Samuel [name] [name]. I am not so sure about the Samuel [name] [name] and if that needs to be given to him in the future, we could potentially consider that under the cover of prednisone immunosuppression. This will need to be careful discussion between his Hematology and Pulmonary team given his severe interstitial lung disease in the lack of any wiggle room if he were to decline/have a flare of pneumonitis.  All questions were answered. The patient was satisfied with the visit. No learning barriers identified during the visit.  OV 12/04/2021  Subjective:  Patient ID: Samuel [name] [name], male , DOB: May 22, 1956 , age 68 y.o. , MRN: [id number] , ADDRESS: [address] [place] [address] PCP Tally Joe, MD Patient Care Team: Tally Joe, MD as PCP - General (Family Medicine)  This Provider for this visit: Treatment Team:  Attending Provider: Kalman Shan, MD  History of COVID-19 in the April  2022 Undiagnosed early ILD in the April 2022 Drug-induced interstitial lung disease - progressive phenotype - Aug 2022 BAL: pulm eosinophilia 51% in the setting of multiple myeloma chemotherapy - aug 2022  - Nov 2022 - BAL 40% lympocytoss 0% eos  - declined by [facility] for transplant eval dec 2022 - needs 5 year sof myelmoma  remission    Prednisone history: 03/05/21 -  dexamethasone 4mg  tabs - 40 tabs for 28 day supply - Samuel. Jeanie Sewer - 40mg  once weekly (for multiple myeloma chemotherapy [name]/V ppx)   04/09/21 - dexamethasone 4mg  tabs - 40 tabs for 28 day supply - Samuel. Jeanie Sewer - 40mg  once weekly  (for multiple myeloma chemotherapy [name]/V ppx)   05/07/21 - dexamethasone 4mg  tabs - 40 tabs for 28 day supply - Samuel. Jeanie Sewer - 40mg  once weekly  (for multiple myeloma chemotherapy [name]/V ppx)   06/06/21 - dexamethasone 4mg  tabs - 40 tabs for 28 day supply - Samuel. Jeanie Sewer - decreased to 20mg  once weekly  06/18/21 - prednisone 10mg  tabs - 104 tabs for 34 day supply - Samuel. Noralee Stain ------50mg  daily x 7 days, 40 mg daily x 7 days, 30 mg daily x 7 days, 20mg  daily x 7 days, 10mg  daily x 7 days, 5mg  daily x 7 days   07/13/21 - prednisone 10mg  tabs - 296 tab for 30 day supply - Samuel. Marchelle [name] ------50mg  daily x 14 days, 40 mg daily x 14 days, 30mg  daily x 14 days, 20mg  daily thereafter  - Mid Nov 2022  - 1gm solumedrol load x 3 days as outpatient  - 09/25/2021 - 20mg  pred per day \ -Late December 2022/early January 2023: Start nintedanib - 12/04/21 -prednisone 15mg  per day      12/04/2021 -   Chief Complaint  Patient presents with   Follow-up    Pt states he stopped taking the OFEV a week ago due to having problems with diarrhea, nausea, and some bleeding still. States since he stopped taking it, he has not had any diarrhea the past 2 days and states the bleeding stopped 2 days ago.     HPI Samuel [name] [name] 68 y.o. -returns for follow-up with his wife.  He tells me that in terms of his multiple myeloma he has visited [facility] hematology department and since then is followed with Samuel. Leonides [name].  He also saw Samuel. Iran Sizer at [facility] pulmonary.  I reviewed Samuel. Ross Ludwig notes from earlier this month 2023.  The general feeling that I get is that they are very nervous to do any form of chemotherapy  in him for fear of exacerbating his lung disease.  He is extremely worried about this approach.  There is also trepidation about Cytoxan.  He is really worried about what to do if his myeloma came back.  On the other hand he is also worried about his lungs.  He said he did talk to her known physician call Alm Bustard through Zoom meeting in [place] and he has recommended that patient use 10 [name] of oxygen with exercise.  He wants higher dose concentrator for this.  I was willing to prescribe this.  He is willing to even pay for this out-of-pocket.  He is also recommended sudden breathing exercises.  Patient is trying different breathing exercises with the hope his lungs can heal.  He is aware that he might be dealing with progressive issues.  He was on nintedanib for a month and early February 2023 he called saying he was having diarrhea and also bloody stools.  He has now stopped nintedanib and for the last week he has not had a diarrhea.  For the last few days there is no bloody stools.  He does not want to do this drug again.  The side effects were quite bad.  There is no further bleeding.  He also believes his hypertension  resolved or improved after stopping nintedanib.  He did have a colonoscopy 5 years ago and since then has not had any problems.  His next colonoscopy might be in another 5 years.  At this point in time he is able to do treadmill exercise on 5 [name] and walk 30 minutes 1.7 mph.  Nevertheless when we walked him on room air here in office he desaturated quickly.  It seems like his distance to desaturation is gotten worse.  He is worried about both the myeloma and the interstitial lung disease.  We discussed options about getting further opinions.  He says he wants to go to a place with his extreme expertise about this.  We discussed the idea about having to go out of state including [place], [facility] and the [facility].  I do remember a name of Samuel. Dia Sitter who is professor at [employer] who is an Database administrator in myeloma.  I did mention this name to him.  I have also written to him.  I also written to 1 Samuel. Rozell Searing at the pulmonary department at the [facility].  Based on the response we will facilitate a referral.  Meanwhile I did tell him that we need to protect his lungs against fibrosis.  He wants to go down on his prednisone to 10 mg/day because of the side effects of weight gain.  I agreed to do this provisionally.  But I also recommended antifibrotic pirfenidone.  We discussed the side effects of nausea anorexia and occasionally diarrhea.  He wants to reflect on this.  He is willing to meet with the pharmacist on this.  I made a referral.   No results found.    PFT  OV 01/24/2022  Subjective:  Patient ID: Samuel [name] [name], male , DOB: 05/09/1956 , age 71 y.o. , MRN: [id number] , ADDRESS: [address] [place] [address] PCP Tally Joe, MD Patient Care Team: Tally Joe, MD as PCP - General (Family Medicine)  This Provider for this visit: Treatment Team:  Attending Provider: Kalman Shan, MD    01/24/2022 -   Chief Complaint  Patient presents with   Follow-up    PFT performed 12/10/21. Pt recently had a referral with [facility].  Pt states he has been doing okay since last visit   Drug-induced pneumonitis follow-up interstitial lung disease  HPI Samuel [name] [name] 68 y.o. -[facility].  Samuel. Rozell Searing also called me last week.  Decided to go with high-dose prednisone because of some groundglass opacities.  Did not want to do concomitant pirfenidone because of potential side effect profile.  Patient has upcoming follow-up appointment Mar 09, 2022 at [facility] with Samuel. Rozell Searing.  Also saw the myeloma specialist.  In case of recurrence some alternatives have been recommended.  He tells me that he is getting more optimistic.  He understands the significance of his disease in the severely but he is doing breathing exercises remaining  optimistic.  He is trying an organic diet.  And therefore his positive state of mind is making him feel better.  He does workout on a treadmill at 2.2 mph.  He covers 1.4 miles in 40 minutes.  He uses 8 [name] of oxygen for this.   Subjective symptom assessment score is documented below in the slightly better from last visit and significantly better compared to October 2022.  Walking desaturation test is improved from 2 months ago but is similar to follow-up 2022  PFT  OV 04/09/2022  Subjective:  Patient ID: Samuel [name] [name], male , DOB: 1955-12-13 , age 65 y.o. , MRN: [id number] , ADDRESS: [address] [place] [address] PCP Tally Joe, MD Patient Care Team: Tally Joe, MD as PCP - General (Family Medicine)  This Provider for this visit: Treatment Team:  Attending Provider: Kalman Shan, MD    04/09/2022 -   Chief Complaint  Patient presents with   Follow-up    Pt states he has been doing okay since last visit but states he did get covid 6/6 and then after that he has had a cough.     HPI Samuel [name] [name] 68 y.o. 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  He had a second visit to [facility].  He saw Samuel. Renita Papa for pulmonary.  In terms of his pulmonary status things were deemed to be stable.  He had lung function studies.  His FVC is actually improved to 1.68 [name] on 03/19/2022 and his DLCO is stable around 10.3./Slightly improved.  Overall his pulmonary function test is stable/trending in the right direction.  He is lost significant amount of weight it is if his improvement could be because of that.  However he was feeling subjectively improved.  According to the [name] notes pirfenidone is not being recommended because he is stable but he tells me that the decision was left up to him.  Certainly they want him to preserve his lung function and lose more weight.  Today wanted to talk about  pirfenidone but in the interim immediately after coming back March 27, 2022 he emailed me saying that he had COVID.  He believes he got it at [facility].  He has taken antiviral.  After this he is better but he still having some slightly worse dyspnea on exertion than baseline.  Some slightly more subjective use of oxygen at baseline [baseline exertional pulse ox stable/slightly worse].  More coughing and wheezing than baseline.  In terms of his prednisone therapy for his ILD and this is being tapered currently 7.5 mg/day.  [facility] Samuel. Rozell Searing has on a tapering regimen to off but in the middle of this he had increased respiratory symptoms.  Wife is also reporting more sciatica as the prednisone is coming down.  In terms of his hematology he has been seen by [facility] multiple myeloma program and they made specific recommendations that I reviewed.  They are going to be in touch with his local hematologist oncologist Samuel. Leonides [name] but currently under remission.  Weight loss has been emphasized.  We discussed Ozempic for weight loss.      OV 06/06/2022  Subjective:  Patient ID: Samuel [name] [name], male , DOB: 09-30-1956 , age 89 y.o. , MRN: [id number] , ADDRESS: [address] [place] [address] PCP Tally Joe, MD Patient Care Team: Tally Joe, MD as PCP - General (Family Medicine)  This Provider for this visit: Treatment Team:  Attending Provider: Kalman Shan, MD    06/06/2022 -  incent J Seldon 68 y.o. 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  Pt states that he has not been feeling well since getting Covid May 2023 after going to [facility].     HPI Samuel [name] [name] 68 y.o. -Returns for follow-up.  He presents with his wife Zella Ball. 2 weeks ago tapered off prednisone. Same time also  at request we started Pirfenidone he has now completed at 2 pills 3 times daily.   He is going to start 3 pills 3 times daily tomorrow.  He says that overall he is not feeling all that well.  His symptom scores of worsened.  He definitely feels more dyspneic.  He is also having worsening cough although his oxygen requirements at home are not any worse.  He is also feeling more tired.  He is having some back pain.  Had MRI and had intrathecal steroid and after that the back pain is better.  He is wondering if all the symptoms are related to coming off prednisone.  At the same time he also states his myeloma is recurring.  His urine [name] protein is spiking.  He is going to see Samuel. Leonides [name] in September 2023 and get started on the new regimen recommended by [facility] that is potentially less toxic to the lungs     His pulmonary function test is better than December 2022 seems worse than May 2023 when he was at [facility]  Related to prednisone taper.  His walking desaturation test is stable.          12/04/2022 Follow up : ILD , drug-induced pneumonitis, chronic respiratory failure, asthma Patient returns for a 1 month follow-up.  Patient complains that he continues to have ongoing cough, congestion, nasal drainage, shortness of breath.  As above patient has underlying interstitial lung disease with drug-induced pneumonitis, felt secondary to medications used for treatment of his multiple myeloma.  Medications were stopped.  Patient was treated with a steroid challenge.  Patient did try Esbriet for brief time in fall 2023 but was unable to tolerate.  Patient also had COVID 19 infection in 2022 and 2023 and felt that his cough worsened after each episode. Patient'[name] wife is also sick at home with an upper respiratory infection.  Has noticed that his cough is been worse over the last week.  Cough is very aggravating.  And affects his quality life.  Worse at night.  Patient does have a dog at home.  But says he is hypoallergenic.  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  Does not have a hot tub or basement.  No birds or chickens.  COVID-19 test and influenza test today in the office are negative.  Patient denies any hemoptysis, chest pain, orthopnea.  Does have some discolored nasal discharge as dark in the morning but clears as the day goes on.  He had similar symptoms last month and was treated with a 14-day course of antibiotics.  He was also treated in December with antibiotics and steroids.  Patient has been referred to ENT and consult is pending.  He is currently taking Advair twice daily.  Remains on Claritin daily.   OV 12/26/2022  Subjective:  Patient ID: Samuel [name] [name], male , DOB: 1956/01/01 , age 89 y.o. , MRN: [id number] , ADDRESS: [address] [place] [address] PCP Tally Joe, MD Patient Care Team: Tally Joe, MD as PCP - General (Family Medicine)  This Provider for this visit: Treatment Team:  Attending Provider: Kalman Shan, MD    12/26/2022 -   Chief Complaint  Patient presents with   Follow-up    Fatigue, cough and chest congestion today.  Has had some improvement.  Esbriet caused dizziness, GI upset and lethargy.      Follow up : ILD , drug-induced pneumonitis, chronic respiratory failure, asthma Esbriet stopped in  October 2023 due to inability to tolerate due to GI side effects    # IgG Kappa Multiple Myeloma 02/22/2021: bone marrow biopsy confirms the diagnosis of Multiple Myeloma with a monoclonal plasma cell population.   # History of asthma.  Mid February 2020 for mildly elevated IgE and dust mite allergy.  On RAST allergy panel.  HPI Weylyn Ricciuti Rasco 68 y.o. -returns for follow-up.  Presents with his wife Zella Ball.  He has been having some cough and congestion.  Nurse practitioner Rikki Spearing in mid February 2024 did RAST allergy panel.  IgE is slightly high.  He has dust mite allergy.  He states he is known to have allergies.  He did have allergy shots for 10 years leading up until the recent illness  and then that was stopped.  His wife feels that the allergy shots did help him.  He has met with Samuel. [place] [name] and they are discussing whether to restart the allergy shots.  He wanted to know if there is any contraindication.  I felt that he should definitely try allergy shots or at least Xolair.  Although I was not sure whether he should undergo skin testing again.  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  He is able to play golf although very slowly.  Walking desaturation test today was stable very slowly.  He has not had a pulmonary function test and this is scheduled in a few weeks.  I offered to postpone this given his overall subjective symptoms stability and walking desaturation test ability but he wanted to keep it and come back and see me again within the next few to several weeks.  In terms of his myeloma reviewed Samuel. Jeanie Sewer notes and his [name] protein is less than 0.5.  Therefore he is still on monitoring plan with plans to start [facility] protocol if myeloma were to relapse.  He is nervous about it because of the recent ILD.  We have agreed to discuss this again if the situation were to arise.     OV 01/20/2023  Subjective:  Patient ID: Samuel [name] [name], male , DOB: Mar 09, 1956 , age 53 y.o. , MRN: [id number] , ADDRESS: [address] [place] [address] PCP Tally Joe, MD Patient Care Team: Tally Joe, MD as PCP - General (Family Medicine)  This Provider for this visit: Treatment Team:  Attending Provider: Kalman Shan, MD    01/20/2023 -   Chief Complaint  Patient presents with   Follow-up    F/up PFT     HPI Samuel [name] [name] 67 y.o. -as I got ready to see him I fell ill and declined to see him. Needs to be rescheduled.        OV 06/12/2023  Subjective:  Patient ID: Samuel [name] [name], male , DOB: 1956-03-12 , age 21 y.o. , MRN: [id number] , ADDRESS: [address] [place] [address] PCP  Tally Joe, MD Patient Care Team: Tally Joe, MD as PCP - General (Family Medicine)  This Provider for this visit: Treatment Team:  Attending Provider: Kalman Shan, MD     06/12/2023 -   Chief Complaint  Patient presents with   Follow-up    F/up on PFT    HPI Samuel [name] Pustejovsky Fry 68 y.o. -presents for follow-up.  His last visit with me was in March 2024.  And in the April visit had to be canceled because I was sick.  Since  then he has seen Samuel. Leonides [name] his myeloma specialist.  At that visit he reported that he has got a new diagnosis of acid reflux and laryngoscopy showed inflammation.  He still reported that he was using 2 to 3 [name] oxygen at rest and at home with 10 [name] at exercise.  The [name] protein itself was deemed stable and continued monitoring decision was taken through a shared decision-making process.  Decision to start treatment was held off.  He also saw speech-language pathologist on 05/26/2023 in terms of his shortness of breath he feels stable.  In fact his subjective symptom scores are stable.  He says that when he does bench press he does not desaturate but for treadmill he will have to use a lot of oxygen.  However pulmonary function test shows a significant decline 12% compared to the recent 1.  He says he is not feeling it.  He says in fact he did a good cooperative PFT.  His sit/stand hypoxemia exercise test was also adequate.  In February 2024 he did not have any pulmonary hypertension.  His last CT scan was in February 2024 of the chest.  There is no lung cancer.  We discussed about getting BNP and echocardiogram as a screening for pulmonary hypertension and he is interested in this.  The intent would be to treat with inhaled treprostinil if he has pulmonary hypertension.  Meanwhile his [name] protein levels are higher 0.8.  Therapy is being considered.  I reviewed the intended immunomodulator therapy.  There is no report of any ILD but there is increase of shortness of breath and  respiratory infections.   OV 09/12/2023  Subjective:  Patient ID: Samuel [name] [name], male , DOB: [birth year] , age 56 y.o. , MRN: [id number] , ADDRESS: [address] [place] [address] PCP Tally Joe, MD Patient Care Team: Tally Joe, MD as PCP - General (Family Medicine)  This Provider for this visit: Treatment Team:  Attending Provider: Kalman Shan, MD    09/12/2023 -   Chief Complaint  Patient presents with   Acute Visit    SOB with exertion, cough, nasal and chest congestion, chest tightness, wheezing x 3 weeks.     HPI ANTONEO GHRIST 68 y.o. -last seen in Samuel [name] 2024.  There is an acute visit.  His wife Zella Ball is here with him.  She is worried that he is declining.  She tells me that he is no longer even able to walk up the golf course and he stopped playing golf a few weeks ago.  Patient states that he was playing 9 holes of golf and walking and talking and doing really well.  A few months ago he was drinking turmeric protein shakes and had bad acid reflux and after that started going to speech therapy and was really helping him.  Then in the last 3 weeks because of the change in season to fall with the onset of drop in fall leaves he started having increased nasal congestion, chest congestion, hacking cough constantly clearing his throat worsening dyspnea.  Pending.  A lot of shallow breaths.  Sometimes he feels the shallow breaths are from anxiety he is clearing his throat a lot.  He also feels his chest is in spasms.  He stopped playing golf a week ago.  He was only playing 3 holes last week and got short of breath.  Exertional pulse ox sometimes at home dropped to the 60s.  He had blood work 2 days ago and  other than sodium labs are normal and [name] protein is 0.7 in October with a November 1 still pending.  Reviewed speech therapy notes.  Last high-resolution CT chest February 2024 without change  Echo September 2024 with grade 1 diastolic dysfunction but good  ejection fraction.  He started a 8-day prednisone taper today that I called in 2 days ago  Review of medication shows he is on Ventolin, aspirin, Breztri, Flonase, guaifenesin, Singulair, Prilosec and now turmeric capsules.  He is also on sildenafil.  Not on anticoagulation.           11/28/2023 Follow up ; ILD, drug-induced pneumonitis, chronic respiratory failure, asthma Patient presents for a 1 month follow-up.  Patient is followed for interstitial lung disease in the setting of drug-induced pneumonitis but disease felt secondary to multiple myeloma drug treatment) patient was seen last visit after a progressive clinical decline with decreased activity tolerance and increased oxygen requirement.  PFTs in Samuel [name] showed decreased lung capacity.  2D echo in September 2024 showed preserved EF and grade 1 diastolic dysfunction.  Lab work with an elevated D-dimer with subsequent CT angio was negative for PE.  Showed mild diffuse groundglass opacities slightly increased in the bases.  And stable lesions on the ribs and scapula.  He was treated for a possible asthmatic bronchitis in early December with prednisone and a Z-Pak.  He had ongoing symptoms and was called in Levaquin.  Last visit patient did have improvement in cough and congestion however continued to have decreased activity tolerance and increased shortness of breath.  He was placed on a low prednisone burst with prednisone 20 mg for 1 week, 10 mg for 2 weeks and then 5 mg daily. He returns today with some improvement.  Patient says he has been able to do slightly more activities and has been able to turn oxygen down to 1 [name] at rest and 3 [name] with activity.  He still uses 8 to 9 [name] with exercise on the treadmill.  A modified barium swallow that showed normal swallow.  Some mild dysmotility.  He is going to see speech therap/ENT for an evaluation.  He also has been referred to pulmonary rehab and begin this week he denies any increased cough,  congestion.  Chest x-ray last visit showed stable chronic changes.  Sputum culture with an adequate.  He has an upcoming high-resolution CT chest in March    OV 01/13/2024  Subjective:  Patient ID: Samuel [name] [name], male , DOB: November 17, 1955 , age 5 y.o. , MRN: [id number] , ADDRESS: [address] [place] [address] PCP Tally Joe, MD Patient Care Team: Tally Joe, MD as PCP - General (Family Medicine)  This Provider for this visit: Treatment Team:  Attending Provider: Kalman Shan, MD    01/13/2024 -   Chief Complaint  Patient presents with   Follow-up    Sob, can't breathe, go over recent CT Scan   Follow up : ILD , drug-induced pneumonitis, chronic respiratory failure, asthma Esbriet stopped in October 2023 due to inability to tolerate due to GI side effects   Ofev spped in 2022 -intolerant  Esbriet stopped in October 2023 due to inability to tolerate due to GI side effects   # IgG Kappa Multiple Myeloma 02/22/2021: bone marrow biopsy confirms the diagnosis of Multiple Myeloma with a monoclonal plasma cell population.   # History of asthma.  Mid February 2020 for mildly elevated IgE and dust mite allergy.  On RAST allergy panel.  HPI Wick[place]fe  J Halteman 68 y.o. -presents for follow-up.  Since I last saw him several months ago he has declined.  He says since Christmas he is now desaturating even on room air at rest or with minimal exertion.  Pulse ox was 89% room air at rest.  He has been intolerant to previous antifibrotic'[name].  He is really frustrated by his decline.  He feels his nasal passages are blocked and he is not able to take a deep breath.  He has visited ENT and they have reassured him.  I did indicate to him with his oxygen demands the nasal passage might not be wide enough to supply the air that he is demanding because of his ILD.  Nevertheless he does have some sinus drainage issues.  We did a CT scan of the chest and clearly he has progression in his ILD.  In  addition he is enlarged pulmonary arteries indicated was suggestive of pulmonary hypertension.  I have recommended right heart catheterization and have reached out to Samuel. Patterson Hammersmith by electronic medical record.  To facilitate right heart cath.  He is also dealing with significant cough with some dry and white sputum.  I recommend opioids for cough control.  He is open to this idea.  Made a prescription  In terms of his myeloma his [name] protein is down to 0.5 and he feels it is in remission.      SYMPTOM SCALE - ILD 08/09/2021 09/25/2021 218# 12/04/2021 222# 01/24/22 222# 04/09/2022 215# 06/06/2022 211# 06/12/2023 205# 09/12/2023   Current weight    Treadmill 2.2 mph.  As 1.4 miles over 40 minutes.  Uses 8 [name] oxygen Post may 2nd visit 03/27/22 -2ndcvoid     O2 use ra ra ra ra 2L eert 2L exert O2 with exertion   Shortness of Breath 0 -> 5 scale with 5 being worst (score 6 If unable to do)         At rest 0 0 0 0 0 4 0.5 2  Simple tasks - showers, clothes change, eating, shaving 2 2 1  0/5 0 3 2 3   Household (dishes, doing bed, laundry) x na 1 1 1 3 3 4   Shopping 3 1 1.5 0/5 1 3  0.5 3  Walking level at own pace 4 2 2 1 1 2  2.5 4  Walking up Stairs 5 4 3  3.5 3 2 5 5   Total (30-36) Dyspnea Score 14 9 8.5 6 6 17  14.5 21  How bad is your cough? 3 1 0.5  3.4 5 Only iwht acid reflux 4  How bad is your fatigue 0 2 0  3   3  How bad is nausea 0 0 0  0 5 0 1  How bad is vomiting?  0 0 0  0 0 0 0  How bad is diarrhea? 0 0 0  0 4 0 1  How bad is anxiety? 5 2 1   0.5 0 6 3  How bad is depression 2 1 1   0.5 0 2 3  Any chronic pain - if so where and how bad x x x  x  x 0     Simple office walk 185 feet x  3 laps goal with forehead probe 07/12/2021  08/09/2021  09/25/2021  12/04/2021  01/24/2022  04/09/2022  06/06/2022  12/26/2022  06/12/2023 09/12/2023   O2 used ra Ra3 ra ra ra ra ra ra ra  Number laps completed 3 3 but did oly 2 3 3  attempted  but stopped at 2 due to deats All 3 las Stopped at [employer] all 3 Stopped at 2 1 lap and even that in half away  Comments about pace slow slow slow slow Avg pace Avg  slow slow   Resting Pulse Ox/HR 100% and 69/min 100%ad 74 98% and 75 99% RA and59 98% and HR 60 98% and HR 54 98% nand HR 90 98% [name] HR 73 100% and HR 64  Final Pulse Ox/HR 92% and 81/min 93% and 87 91% and 91 86% RA and 85 91% and HR 80 (brief 89%) 88% and HR 80 91% and HR 107 90% an HR 79 92% and HR 67 - half lap  Desaturated </= 88% no no no yes ni  no    Desaturated <= 3% points Yes 8 Yes, 7 pponts Yes, 7 points Yes, 13 poins Yes 7 points Yes, 10 points Yes 7 poin    Got Tachycardic >/= 90/min no no yes no no  uyes    Symptoms at end of test No complaints Mod-severe dyspnea Mild dyspnea No complaints none Seemed ok Mild dyspnea    Miscellaneous comments x Worse? stable ? worse     Reduced efforr tolerance    CT Chest data from date: *12/24/23  - personally visualized and independently interpreted : yes - my findings are: as below  IMPRESSION: 1. Pulmonary parenchymal pattern of interstitial lung disease, as detailed above, stable from 09/12/2023 but progressive from 07/30/2021. 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 Findings are indeterminate for UIP per consensus guidelines: Diagnosis of Idiopathic Pulmonary Fibrosis: An Official ATS/ERS/JRS/ALAT Clinical Practice Guideline. Am Rosezetta Schlatter Crit Care Med Vol 198, Iss 5, [phone], Jun 21 2017. 2. Aortic atherosclerosis (ICD10-I70.0). Coronary artery calcification. 3. Enlarged pulmonic trunk, indicative of pulmonary arterial hypertension.     Electronically Signed   By: Leanna Battles [name].D.   On: 01/06/2024 15:48  PFT     Latest Ref Rng & Units 06/12/2023   11:30 AM 01/17/2023    2:09 PM 09/26/2022   10:34 AM 08/01/2022    1:57 PM 06/06/2022    1:52 PM 12/10/2021    9:24 AM 10/16/2021    3:56 PM  PFT Results  FVC-Pre [name] 1.19  1.36  1.32  1.55  1.43  1.49  1.35   FVC-Predicted Pre % 26  29  28   33   30  32  29   Pre FEV1/FVC % % 88  90  85  94  88  94  92   FEV1-Pre [name] 1.05  1.23  1.12  1.46  1.26  1.40  1.24   FEV1-Predicted Pre % 30  36  32  42  36  40  36   DLCO uncorrected ml/min/mmHg 9.36  12.92  9.61  10.43  13.19  8.64  9.99   DLCO UNC% % 35  48  36  39  49  32  37   DLCO corrected ml/min/mmHg 9.36  13.27  9.96  10.58  13.77  9.20  10.04   DLCO COR %Predicted % 35  49  37  39  51  34  37   DLVA Predicted % 126  127  107  110  128  97  109        LAB RESULTS last 96 hours No results found.       has a past medical history of Asthma, GERD (gastroesophageal reflux disease), High cholesterol, History of  blood transfusion, Hypothyroidism, Interstitial lung disease (HCC), Multiple myeloma (HCC), Perennial allergic rhinitis, Pneumonia, Sciatic pain, right, Seasonal allergic rhinitis, and Thyroid disease.   reports that he quit smoking about 41 years ago. His smoking use included cigarettes. He started smoking about 44 years ago. He has a 0.3 pack-year smoking history. He has never used smokeless tobacco.  Past Surgical History:  Procedure Laterality Date   BRONCHIAL BIOPSY  06/18/2021   Procedure: BRONCHIAL BIOPSIES;  Surgeon: Leslye Peer, MD;  Location: [facility];  Service: Cardiopulmonary;;   BRONCHIAL BIOPSY  08/28/2021   Procedure: BRONCHIAL BIOPSIES;  Surgeon: Josephine Igo, DO;  Location: [facility];  Service: Cardiopulmonary;;   BRONCHIAL WASHINGS  06/18/2021   Procedure: BRONCHIAL WASHINGS;  Surgeon: Leslye Peer, MD;  Location: [facility];  Service: Cardiopulmonary;;   BRONCHIAL WASHINGS  08/28/2021   Procedure: BRONCHIAL WASHINGS;  Surgeon: Josephine Igo, DO;  Location: [facility];  Service: Cardiopulmonary;;   NASAL SINUS SURGERY     NECK SURGERY  1996   VIDEO BRONCHOSCOPY [name]/A 06/18/2021   Procedure: VIDEO BRONCHOSCOPY WITH FLUORO;  Surgeon: Leslye Peer, MD;  Location: [facility];  Service: Cardiopulmonary;  Laterality: [name]/A;   VIDEO  BRONCHOSCOPY [name]/A 08/28/2021   Procedure: VIDEO BRONCHOSCOPY WITH FLUORO;  Surgeon: Josephine Igo, DO;  Location: [facility];  Service: Cardiopulmonary;  Laterality: [name]/A;    Allergies  Allergen Reactions   Clarithromycin Other (See Comments)    Hiccups   Ofev [Nintedanib] Other (See Comments)    GI bleeding   Penicillins Other (See Comments)    Child hood unsure    Immunization History  Administered Date([name]) Administered   Fluad Quad(high Dose 65+) 08/09/2021, 08/01/2022, 07/22/2023   Influenza Split 10/27/2017   Influenza, High Dose Seasonal PF 12/01/2017, 11/29/2019, 12/19/2020, 12/24/2021   Influenza, Quadrivalent, Recombinant, Inj, Pf 07/28/2019, 08/11/2020   Influenza-Unspecified 11/26/2011, 07/21/2017   PFIZER(Purple Top)SARS-COV-2 Vaccination 12/27/2019, 01/24/2020, 09/21/2020   Tdap 08/20/2005, 09/07/2015   Zoster, Live 11/06/2017, 05/07/2018    Family History  Problem Relation Age of Onset   Hypertension Mother    Allergies Mother    Allergies Father    CAD Father 55   Breast cancer Paternal Grandmother    Hypertension Other    Heart disease Other      Current Outpatient Medications:    acyclovir (ZOVIRAX) 400 MG tablet, Take 1 tablet (400 mg total) by mouth 2 (two) times daily., Disp: 60 tablet, Rfl: 3   albuterol (PROVENTIL) (2.5 MG/3ML) 0.083% nebulizer solution, USE ONE VIAL IN NEBULIZER EVERY 6 HOURS AS NEEDED WHEEZING OR SHORTNESS OF BREATH, Disp: 120 mL, Rfl: 12   albuterol (VENTOLIN HFA) 108 (90 Base) MCG/ACT inhaler, Inhale 2 puffs into the lungs every 6 hours as needed for wheezing or shortness of breath., Disp: 8.5 g, Rfl: 1   ALPRAZolam (XANAX) 0.5 MG tablet, Take 0.5 mg by mouth at bedtime., Disp: , Rfl:    ARMOUR THYROID 90 MG tablet, Take 90 mg by mouth daily., Disp: , Rfl:    aspirin EC 81 MG tablet, Take 1 tablet (81 mg total) by mouth daily. Swallow whole., Disp: 90 tablet, Rfl: 3   ASSESS FULL RANGE PEAK METER DEVI, as directed., Disp: , Rfl:     azelastine (ASTELIN) 0.1 % nasal spray, Place 2 sprays into both nostrils 2 (two) times daily. Use in each nostril as directed, Disp: 30 mL, Rfl: 12   bisoprolol (ZEBETA) 5 MG tablet, Take 1 tablet (5 mg total)  by mouth daily., Disp: 30 tablet, Rfl: 2   Budeson-Glycopyrrol-Formoterol (BREZTRI AEROSPHERE) 160-9-4.8 MCG/ACT AERO, Inhale 2 puffs into the lungs in the morning and at bedtime., Disp: 10.7 g, Rfl: 11   cetirizine (ZYRTEC) 10 MG tablet, Take 1 tablet (10 mg total) by mouth daily., Disp: 30 tablet, Rfl: 11   cholecalciferol (VITAMIN D) 1000 UNITS tablet, Take 3,000 Units by mouth daily., Disp: , Rfl:    Dextromethorphan HBr (DELSYM PO), Take by mouth as needed. For cough, Disp: , Rfl:    dextromethorphan-guaiFENesin (MUCINEX DM) 30-600 MG 12hr tablet, Take 1 tablet by mouth 2 (two) times daily., Disp: , Rfl:    EPINEPHrine (EPI-PEN) 0.3 mg/0.3 mL DEVI, Inject 0.3 mg into the muscle as needed., Disp: , Rfl:    ezetimibe-simvastatin (VYTORIN) 10-40 MG tablet, Take 1 tablet by mouth daily., Disp: 90 tablet, Rfl: 3   fluticasone (FLONASE) 50 MCG/ACT nasal spray, 1 spray., Disp: , Rfl:    Guaifenesin 1200 MG TB12, , Disp: , Rfl:    HYDROcodone bit-homatropine (HYCODAN) 5-1.5 MG/5ML syrup, Take 5 mLs by mouth every 6 (six) hours as needed for cough., Disp: 240 mL, Rfl: 0   hydrOXYzine (ATARAX) 25 MG tablet, Take 25 mg by mouth every 8 (eight) hours as needed., Disp: , Rfl:    ibuprofen (ADVIL) 200 MG tablet, Take 200 mg by mouth every 6 (six) hours as needed., Disp: , Rfl:    ketoconazole (NIZORAL) 2 % cream, SMARTSIG:1 Application Topical 1 to 2 Times Daily, Disp: , Rfl:    levothyroxine (SYNTHROID) 50 MCG tablet, Take 50 mcg by mouth daily before breakfast., Disp: , Rfl:    montelukast (SINGULAIR) 10 MG tablet, Take 10 mg by mouth at bedtime., Disp: , Rfl:    Multiple Vitamin (MULTIVITAMIN) tablet, Take 1 tablet by mouth daily., Disp: , Rfl:    omeprazole (PRILOSEC) 40 MG capsule, Take 1  capsule (40 mg total) by mouth 2 (two) times daily. Take 30 minutes before a meal, Disp: 60 capsule, Rfl: 3   OXYGEN, Inhale 2 [name] into the lungs continuous. As needed, Disp: , Rfl:    predniSONE (DELTASONE) 5 MG tablet, Daily for 3 weeks and then every other day, Disp: 30 tablet, Rfl: 1   sildenafil (REVATIO) 20 MG tablet, Take 20 mg by mouth daily as needed (ED)., Disp: , Rfl:    Simethicone (SIMETHICONE ULTRA STRENGTH) 180 MG CAPS, Take 1 capsule (180 mg total) by mouth 3 (three) times daily as needed., Disp: 90 capsule, Rfl: 0   sodium chloride (OCEAN) 0.65 % SOLN nasal spray, Place 1 spray into both nostrils as needed for congestion., Disp: , Rfl:    tamsulosin (FLOMAX) 0.4 MG CAPS capsule, Take 0.4 mg by mouth daily., Disp: , Rfl:    triamcinolone cream (KENALOG) 0.1 %, Apply 1 application topically daily as needed (sun burn itch)., Disp: , Rfl:    Turmeric 400 MG CAPS, 1 capsule Orally once a day, Disp: , Rfl:    UNABLE TO FIND, Med Name: Allergy injections once weekly, Disp: , Rfl:       Objective:   Vitals:   01/13/24 1320  BP: 120/70  Pulse: 63  Temp: (!) 97 F (36.1 C)  TempSrc: Oral  SpO2: 97%  Weight: 205 lb (93 kg)  Height: 5\' 10"  (1.778 [name])    Estimated body mass index is 29.41 kg/[name] as calculated from the following:   Height as of this encounter: 5\' 10"  (1.778 [name]).   Weight as  of this encounter: 205 lb (93 kg).  @WEIGHTCHANGE @  [employer]   01/13/24 1320  Weight: 205 lb (93 kg)     Physical Exam   General: No distress.  Looks sad O2 at rest: yes Cane present: no Sitting in wheel chair: no Frail: non Obese: o Neuro: Alert and Oriented x 3. GCS 15. Speech normal Psych: Pleasant Resp:  Barrel Chest - no.  Wheeze - no, Crackles - yes base, No overt respiratory distress CVS: Normal heart sounds. Murmurs - no Ext: Stigmata of Connective Tissue Disease - no HEENT: Normal upper airway. PEERL +. No post nasal drip        Assessment:       ICD-10-CM    1. Chronic respiratory failure with hypoxia (HCC)  J96.11 Pulmonary function test    2. ILD (interstitial lung disease) (HCC)  J84.9 Pulmonary function test    3. Drug-induced pneumonitis  J98.4 Pulmonary function test   T50.905A     4. Enlarged pulmonary artery (HCC)  I28.8 Pulmonary function test    5. Dyspnea on exertion  R06.09 Pulmonary function test    6. Chronic cough  R05.3 Pulmonary function test         Plan:     Patient Instructions  ILD (interstitial lung disease) (HCC) Drug-induced pneumonitis Cough and congestion/ASthma/SEasonal allergies =  - RAST panel positive for mild IgE elevation and dust mite allergy Enlarged pulmonary artery  =- Fibrosis is worse;  - causing lot of cough - concern for pulmonary hypertension as well  Plan -Recommend right heart catheterization  -Sent a message to Samuel. Rosemary Holms  -Start Hycodan cough syrup 5 mg 4 times daily as needed  -Prescription sent [facility] [please note it is a controlled substance]  -Do spirometry and DLCO in 6 weeks  Myeloma    [name] protein < 0.8 in July 2024 and 0.5 in March 2025  Plan  - per [facility] and Samuel Elise Benne 6 weeks face-to-face visit 30-minute slot with Samuel. Marchelle [name] but after spirometry   FOLLOWUP Return for 30 min visit, after Cleda Daub and DLCO, Chronic Respiratory Failure, Face to Face Visit.    SIGNATURE    Samuel. Kalman Shan, [name].D., F.C.C.P,  Pulmonary and Critical Care Medicine Staff Physician, [facility] Center Director - Interstitial Lung Disease  Program  Pulmonary Fibrosis [facility] at [facility] [place], [place], [address]  Pager: [phone], If no answer or between  15:00h - 7:00h: call [phone] Telephone: [phone]  5:46 PM 01/13/2024  HIGh Complexity  OFFICE   2021 E/[name] guidelines, first released in 2021, with minor revisions added in 2023. Must meet the requirements for 2 out of 3 dimensions to qualify.     Number and complexity of problems addressed Amount and/or complexity of data reviewed Risk of complications and/or morbidity  Severe exacerbation of chronic illness  Acute or chronic illnesses that may pose a threat to life or bodily function, e.g., multiple trauma, acute MI, pulmonary embolus, severe respiratory distress, progressive rheumatoid arthritis, psychiatric illness with potential threat to self or others, peritonitis, acute renal failure, abrupt change in neurological status Must meet the requirements for 2 of 3 of the categories)  Category 1: Tests and documents, historian  Any combination of 3 of the following:  Assessment requiring an independent historian  Review of prior external note([name]) from each unique source  Review of results of each unique test  Ordering  of each unique test    Category 2: Interpretation of tests    Independent interpretation of a test performed by another physician/other qualified health care professional (not separately reported)  Category 3: Discuss management/tests  Discussion of management or test interpretation with external physician/other qualified health care professional/appropriate source (not separately reported)  HIGH risk of morbidity from additional diagnostic testing or treatment Examples only:  Drug therapy requiring intensive monitoring for toxicity  Decision for elective major surgery with identified pateint or procedure risk factors  Decision regarding hospitalization or escalation of level of care  Decision for DNR or to de-escalate care   Parenteral controlled  substances

## 2024-01-13 NOTE — Telephone Encounter (Signed)
 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 HAs Drug indiced ILD and has declined. PA is enlarted on CT NOW . Failed anti fibrotics. Please consider standard of care ANTI FIBROTIC. HE has myelom and might not be a  good candidate for Phinder study  Pleae cnsider [facility] - he is anxious to get it done ASAP

## 2024-01-13 NOTE — Telephone Encounter (Signed)
 Pt called to st he is congested and he will not be able to attend PR today. Has an appt later today with his pulmonologist.  Ethelda Chick BS, ACSM-CEP 01/13/2024 7:48 AM

## 2024-01-13 NOTE — Telephone Encounter (Signed)
 Happy to get it done ASAP.  Brooke, please bring him in to see me in a week, okay to double book, followed by right heart cath.  Thanks MJP

## 2024-01-13 NOTE — Patient Instructions (Addendum)
 ILD (interstitial lung disease) (HCC) Drug-induced pneumonitis Cough and congestion/ASthma/SEasonal allergies =  - RAST panel positive for mild IgE elevation and dust mite allergy Enlarged pulmonary artery  =- Fibrosis is worse;  - causing lot of cough - concern for pulmonary hypertension as well  Plan -Recommend right heart catheterization  -Sent a message to Dr. Rosemary Holms  -Start Hycodan cough syrup 5 mg 4 times daily as needed  -Prescription [facility] [place] [facility] [please note it is a controlled substance]  -Do spirometry and DLCO in 6 weeks  Myeloma    M protein < 0.8 in July 2024 and 0.5 in March 2025  Plan  - per [facility] and Dr Elise Benne 6 weeks face-to-face visit 30-minute slot with Dr. Marchelle Gearing but after spirometry

## 2024-01-14 NOTE — Telephone Encounter (Addendum)
 Pt scheduled for appointment on 3/31 with Dr. Rosemary Holms.

## 2024-01-15 ENCOUNTER — Encounter (HOSPITAL_COMMUNITY)
Admission: RE | Admit: 2024-01-15 | Discharge: 2024-01-15 | Disposition: A | Discharge status: Home / Self Care | Payer: Medicare Other | Source: Ambulatory Visit | Attending: Internal Medicine | Admitting: Internal Medicine

## 2024-01-15 VITALS — Wt 202.8 lb

## 2024-01-15 DIAGNOSIS — J849 Interstitial pulmonary disease, unspecified: Secondary | ICD-10-CM

## 2024-01-15 NOTE — Telephone Encounter (Signed)
 NFN

## 2024-01-19 ENCOUNTER — Ambulatory Visit: Attending: Cardiology | Admitting: Cardiology

## 2024-01-19 ENCOUNTER — Encounter: Payer: Self-pay | Admitting: Cardiology

## 2024-01-19 ENCOUNTER — Other Ambulatory Visit: Payer: Self-pay

## 2024-01-19 VITALS — BP 129/78 | HR 63 | Ht 71.0 in | Wt 197.0 lb

## 2024-01-19 DIAGNOSIS — I2510 Atherosclerotic heart disease of native coronary artery without angina pectoris: Secondary | ICD-10-CM | POA: Diagnosis present

## 2024-01-19 DIAGNOSIS — I272 Pulmonary hypertension, unspecified: Secondary | ICD-10-CM | POA: Diagnosis present

## 2024-01-19 NOTE — Progress Notes (Signed)
 Virtual Visit via Telephone Note   Because of Samuel [name]'s co-morbid illnesses, he is at least at moderate risk for complications without adequate follow up.  This format is felt to be most appropriate for this patient at this time.  The patient did not have access to video technology/had technical difficulties with video requiring transitioning to audio format only (telephone).  All issues noted in this document were discussed and addressed.  No physical exam could be performed with this format.  Please refer to the patient's chart for his consent to telehealth for [facility].    Date:  01/19/2024   ID:  Samuel [name], DOB 1956/06/13, MRN [id number] The patient was identified using 2 identifiers.  Patient Location: Home Provider Location: Home Office   PCP:  Samuel Joe, MD    [facility] Providers Cardiologist:  Samuel Negus, MD     Evaluation Performed:  Follow-Up Visit  Chief Complaint:  Pulmonary hypertension  History of Present Illness:    Samuel [name] is a 68 y.o. male with multiple myeloma, interstitial lung disease, drug-induced pneumonitis, coronary calcification  Recently, patient's pulmonary fibrosis is currently with significantly increased cough symptoms. Dr. Marchelle [name] is suspecting pulmonary hypertension given send respiratory failure noted on CT scan..  Therefore, referred back to me for consideration for right heart catheterization.    Review of Systems  Cardiovascular:  Negative for chest pain, dyspnea on exertion, leg swelling, palpitations and syncope.  Respiratory:  Positive for cough.     All other systems reviewed and are negative.   Prior CV studies:   The following studies were reviewed today:  Echocardiogram 07/01/2023: 1. Left ventricular ejection fraction, by estimation, is 70 to 75%. The  left ventricle has hyperdynamic function. The left ventricle has no  regional wall motion abnormalities. Left  ventricular diastolic parameters  are consistent with Grade I diastolic  dysfunction (impaired relaxation).   2. Right ventricular systolic function is normal. The right ventricular  size is normal.   3. Left atrial size was mildly dilated.   4. Right atrial size was mildly dilated.   5. The mitral valve is normal in structure. Trivial mitral valve  regurgitation. No evidence of mitral stenosis.   6. The aortic valve is tricuspid. Aortic valve regurgitation is not  visualized. Aortic valve sclerosis is present, with no evidence of aortic  valve stenosis.    Labs/Other Tests and Data Reviewed:    Recent Labs: 09/12/2023: Pro B Natriuretic peptide (BNP) 35.0 12/31/2023: ALT 18; BUN 18; Creatinine 0.90; Hemoglobin 13.0; Platelet Count 263; Potassium 4.1; Sodium 132       Objective:    Vital Signs:   None. Televisit  Physical exam: Not performer. Telephone visit  ASSESSMENT & PLAN:    68 y/o male w/multiple myeloma, interstitial lung disease, drug-induced pneumonitis, coronary calcification  Pulmonary hypertension: Enlarged pulmonary artery, noted on CT scan in the setting of interstitial lung disease, concern for group 3 pulmonary hypertension.  Right heart catheterization.    I have reviewed the risks, indications, and alternatives to cardiac catheterization with the patient. Risks include but are not limited to bleeding, infection, vascular injury, stroke, myocardial infection, arrhythmia, kidney injury, radiation-related injury in the case of prolonged fluoroscopy use, emergency cardiac surgery, and death. The patient understands the risks of serious complication is 1-2 in 1000 with diagnostic cardiac cath and 1-2% or less with angioplasty/stenting.   Coronary calcification: Moderate coronary calcification (CT chest 10/2022). No ischemic on stress  testing (11/2022). Structurally normal heart (11/2022). No anginal symptoms. Exertional dyspnea is most likely due to ILD.  Continue  aspirin 81 mg daily given coronary calcification, along with statin.  He did not tolerate rosuvastatin due to myalgias, and wants to continue ezetimibe-simvastatin.  This is reasonable.        Informed Consent   Shared Decision Making/Informed Consent The risks [stroke (1 in 1000), death (1 in 1000), kidney failure [usually temporary] (1 in 500), bleeding (1 in 200), allergic reaction [possibly serious] (1 in 200)], benefits (diagnostic support and management of coronary artery disease) and alternatives of a cardiac catheterization were discussed in detail with Samuel [name] and he is willing to proceed.        Time:   Today, I have spent 15 minutes with the patient with telehealth technology discussing the above problems.      Signed, Samuel Negus, MD  01/19/2024 1:43 PM    [facility]

## 2024-01-19 NOTE — Progress Notes (Signed)
  Patient Consent for Virtual Visit        Samuel [name] has provided verbal consent on 01/19/2024 for a virtual visit (video or telephone).   CONSENT FOR VIRTUAL VISIT FOR:  Samuel [name]  By participating in this virtual visit I agree to the following:  I hereby voluntarily request, consent and authorize [facility] and its employed or contracted physicians, physician assistants, nurse practitioners or other licensed health care professionals (the Practitioner), to provide me with telemedicine health care services (the "Services") as deemed necessary by the treating Practitioner. I acknowledge and consent to receive the Services by the Practitioner via telemedicine. I understand that the telemedicine visit will involve communicating with the Practitioner through live audiovisual communication technology and the disclosure of certain medical information by electronic transmission. I acknowledge that I have been given the opportunity to request an in-person assessment or other available alternative prior to the telemedicine visit and am voluntarily participating in the telemedicine visit.  I understand that I have the right to withhold or withdraw my consent to the use of telemedicine in the course of my care at any time, without affecting my right to future care or treatment, and that the Practitioner or I may terminate the telemedicine visit at any time. I understand that I have the right to inspect all information obtained and/or recorded in the course of the telemedicine visit and may receive copies of available information for a reasonable fee.  I understand that some of the potential risks of receiving the Services via telemedicine include:  Delay or interruption in medical evaluation due to technological equipment failure or disruption; Information transmitted may not be sufficient (e.g. poor resolution of images) to allow for appropriate medical decision making by the  Practitioner; and/or  In rare instances, security protocols could fail, causing a breach of personal health information.  Furthermore, I acknowledge that it is my responsibility to provide information about my medical history, conditions and care that is complete and accurate to the best of my ability. I acknowledge that Practitioner's advice, recommendations, and/or decision may be based on factors not within their control, such as incomplete or inaccurate data provided by me or distortions of diagnostic images or specimens that may result from electronic transmissions. I understand that the practice of medicine is not an exact science and that Practitioner makes no warranties or guarantees regarding treatment outcomes. I acknowledge that a copy of this consent can be made available to me via my patient portal [facility] MyChart), or I can request a printed copy by calling the office of [facility].    I understand that my insurance will be billed for this visit.   I have read or had this consent read to me. I understand the contents of this consent, which adequately explains the benefits and risks of the Services being provided via telemedicine.  I have been provided ample opportunity to ask questions regarding this consent and the Services and have had my questions answered to my satisfaction. I give my informed consent for the services to be provided through the use of telemedicine in my medical care

## 2024-01-20 ENCOUNTER — Telehealth (HOSPITAL_COMMUNITY): Payer: Self-pay | Admitting: *Deleted

## 2024-01-20 ENCOUNTER — Encounter (HOSPITAL_COMMUNITY): Admission: RE | Admit: 2024-01-20 | Payer: Medicare Other | Source: Ambulatory Visit

## 2024-01-20 NOTE — Telephone Encounter (Signed)
 Patient left message on department voicemail today. He has chest and nasal congestion, therefore will not attend pulmonary rehab today.

## 2024-01-20 NOTE — Addendum Note (Signed)
 Addended by: Neita Goodnight B on: 01/20/2024 06:08 PM   Modules accepted: Orders

## 2024-01-21 ENCOUNTER — Telehealth (HOSPITAL_COMMUNITY): Payer: Self-pay

## 2024-01-21 ENCOUNTER — Telehealth: Payer: Self-pay | Admitting: *Deleted

## 2024-01-21 NOTE — Telephone Encounter (Addendum)
 Right Heart Cath scheduled at [facility] for: Friday January 23, 2024 9 AM Arrival time [facility] Main Entrance A at: 7 AM  Nothing to eat after midnight prior to procedure, clear liquids until 5 AM day of procedure.  Medication instructions: -Usual morning medications can be taken with sips of water.  Plan to go home the same day, you will only stay overnight if medically necessary.  You must have responsible adult to drive you home.  Someone must be with you the first 24 hours after you arrive home.  Reviewed procedure instructions with patient.

## 2024-01-21 NOTE — Telephone Encounter (Signed)
 Called [name] to check on him since has had congestion. Advised him to see Dr. Marchelle Gearing if his congestion is not improving. Also requesting he come at 8:30 am tomorrow for PR.

## 2024-01-22 ENCOUNTER — Telehealth (HOSPITAL_COMMUNITY): Payer: Self-pay

## 2024-01-22 ENCOUNTER — Encounter (HOSPITAL_COMMUNITY)
Admission: RE | Admit: 2024-01-22 | Discharge: 2024-01-22 | Disposition: A | Discharge status: Home / Self Care | Payer: Medicare Other | Source: Ambulatory Visit | Attending: Internal Medicine | Admitting: Internal Medicine

## 2024-01-22 ENCOUNTER — Other Ambulatory Visit: Payer: Self-pay | Admitting: Internal Medicine

## 2024-01-22 DIAGNOSIS — J849 Interstitial pulmonary disease, unspecified: Secondary | ICD-10-CM | POA: Insufficient documentation

## 2024-01-22 NOTE — Telephone Encounter (Signed)
 Called pt to check on him. Pt is still having nasal congestion and bloating. Plans to return next week.

## 2024-01-22 NOTE — Telephone Encounter (Signed)
 Samuel [name]  He emailed saying that [facility] is on backorder for [employer] and he wanted know what other pharmacies will have it.  I am actually heading out of town and I will not be able to do fingerprint signature till Monday.  I have emailed him to ask him what pharmacy-he can call and find out and let me know and I can inform you but I am afraid it will be till Monday unless somebody else here can do it      SIGNATURE    Dr. Kalman Shan, M.D., F.C.C.P,  Pulmonary and Critical Care Medicine Staff Physician, [facility] Director - Interstitial Lung Disease  Program  Pulmonary Fibrosis [facility] at [facility] [place], [place], [address]   Pager: [phone], If no answer  -> Check AMION or Try [phone] Telephone (clinical office): [phone] Telephone (research): [phone]  5:28 PM 01/22/2024

## 2024-01-22 NOTE — Telephone Encounter (Signed)
Hycodan done

## 2024-01-23 ENCOUNTER — Ambulatory Visit (HOSPITAL_COMMUNITY)
Admission: RE | Admit: 2024-01-23 | Discharge: 2024-01-23 | Disposition: A | Discharge status: Home / Self Care | Attending: Cardiology | Admitting: Cardiology

## 2024-01-23 ENCOUNTER — Encounter (HOSPITAL_COMMUNITY): Payer: Self-pay | Admitting: Cardiology

## 2024-01-23 ENCOUNTER — Other Ambulatory Visit: Payer: Self-pay

## 2024-01-23 ENCOUNTER — Encounter (HOSPITAL_COMMUNITY)
Admission: RE | Disposition: A | Discharge status: Home / Self Care | Payer: Self-pay | Source: Home / Self Care | Attending: Cardiology

## 2024-01-23 DIAGNOSIS — I272 Pulmonary hypertension, unspecified: Secondary | ICD-10-CM | POA: Insufficient documentation

## 2024-01-23 DIAGNOSIS — Z7982 Long term (current) use of aspirin: Secondary | ICD-10-CM | POA: Diagnosis not present

## 2024-01-23 DIAGNOSIS — I2510 Atherosclerotic heart disease of native coronary artery without angina pectoris: Secondary | ICD-10-CM | POA: Diagnosis not present

## 2024-01-23 DIAGNOSIS — Z79899 Other long term (current) drug therapy: Secondary | ICD-10-CM | POA: Diagnosis not present

## 2024-01-23 DIAGNOSIS — J8410 Pulmonary fibrosis, unspecified: Secondary | ICD-10-CM | POA: Insufficient documentation

## 2024-01-23 DIAGNOSIS — C9000 Multiple myeloma not having achieved remission: Secondary | ICD-10-CM | POA: Diagnosis not present

## 2024-01-23 HISTORY — PX: RIGHT HEART CATH: CATH118263

## 2024-01-23 LAB — POCT I-STAT EG7
Acid-Base Excess: 4 mmol/L — ABNORMAL HIGH (ref 0.0–2.0)
Acid-Base Excess: 5 mmol/L — ABNORMAL HIGH (ref 0.0–2.0)
Bicarbonate: 31 mmol/L — ABNORMAL HIGH (ref 20.0–28.0)
Bicarbonate: 32 mmol/L — ABNORMAL HIGH (ref 20.0–28.0)
Calcium, Ion: 1.18 mmol/L (ref 1.15–1.40)
Calcium, Ion: 1.21 mmol/L (ref 1.15–1.40)
HCT: 36 % — ABNORMAL LOW (ref 39.0–52.0)
HCT: 37 % — ABNORMAL LOW (ref 39.0–52.0)
Hemoglobin: 12.2 g/dL — ABNORMAL LOW (ref 13.0–17.0)
Hemoglobin: 12.6 g/dL — ABNORMAL LOW (ref 13.0–17.0)
O2 Saturation: 72 %
O2 Saturation: 75 %
Potassium: 4.1 mmol/L (ref 3.5–5.1)
Potassium: 4.2 mmol/L (ref 3.5–5.1)
Sodium: 136 mmol/L (ref 135–145)
Sodium: 136 mmol/L (ref 135–145)
TCO2: 33 mmol/L — ABNORMAL HIGH (ref 22–32)
TCO2: 34 mmol/L — ABNORMAL HIGH (ref 22–32)
pCO2, Ven: 54.6 mmHg (ref 44–60)
pCO2, Ven: 55.8 mmHg (ref 44–60)
pH, Ven: 7.362 (ref 7.25–7.43)
pH, Ven: 7.366 (ref 7.25–7.43)
pO2, Ven: 41 mmHg (ref 32–45)
pO2, Ven: 42 mmHg (ref 32–45)

## 2024-01-23 SURGERY — RIGHT HEART CATH
Anesthesia: LOCAL

## 2024-01-23 MED ORDER — FENTANYL CITRATE (PF) 100 MCG/2ML IJ SOLN
INTRAMUSCULAR | Status: DC | PRN
  Administered 2024-01-23: 25 ug via INTRAVENOUS

## 2024-01-23 MED ORDER — SODIUM CHLORIDE 0.9% FLUSH: 3.0000 mL | Freq: Two times a day (BID) | INTRAVENOUS | Status: DC

## 2024-01-23 MED ORDER — SODIUM CHLORIDE 0.9% FLUSH: 3.0000 mL | INTRAVENOUS | Status: DC | PRN

## 2024-01-23 MED ORDER — LIDOCAINE HCL (PF) 1 % IJ SOLN
INTRAMUSCULAR | Status: DC | PRN
  Administered 2024-01-23: 2 mL

## 2024-01-23 MED ORDER — FENTANYL CITRATE (PF) 100 MCG/2ML IJ SOLN
INTRAMUSCULAR | Status: AC
  Filled 2024-01-23: qty 2

## 2024-01-23 MED ORDER — ONDANSETRON HCL 4 MG/2ML IJ SOLN: 4.0000 mg | Freq: Four times a day (QID) | INTRAMUSCULAR | Status: DC | PRN

## 2024-01-23 MED ORDER — ACETAMINOPHEN 325 MG PO TABS: 650.0000 mg | ORAL_TABLET | ORAL | Status: DC | PRN

## 2024-01-23 MED ORDER — SODIUM CHLORIDE 0.9 % IV SOLN: 250.0000 mL | INTRAVENOUS | Status: DC | PRN

## 2024-01-23 MED ORDER — HEPARIN (PORCINE) IN NACL 1000-0.9 UT/500ML-% IV SOLN
INTRAVENOUS | Status: DC | PRN
  Administered 2024-01-23: 500 mL

## 2024-01-23 MED ORDER — HYDRALAZINE HCL 20 MG/ML IJ SOLN: 10.0000 mg | INTRAMUSCULAR | Status: DC | PRN

## 2024-01-23 MED ORDER — LABETALOL HCL 5 MG/ML IV SOLN: 10.0000 mg | INTRAVENOUS | Status: DC | PRN

## 2024-01-23 MED ORDER — MIDAZOLAM HCL 2 MG/2ML IJ SOLN
INTRAMUSCULAR | Status: DC | PRN
  Administered 2024-01-23: 1 mg via INTRAVENOUS

## 2024-01-23 MED ORDER — LIDOCAINE HCL (PF) 1 % IJ SOLN
INTRAMUSCULAR | Status: AC
  Filled 2024-01-23: qty 30

## 2024-01-23 MED ORDER — MIDAZOLAM HCL 2 MG/2ML IJ SOLN
INTRAMUSCULAR | Status: AC
  Filled 2024-01-23: qty 2

## 2024-01-23 SURGICAL SUPPLY — 4 items
CATH BALLN WEDGE 5F 110CM (CATHETERS) IMPLANT
SHEATH GLIDE SLENDER 4/5FR (SHEATH) IMPLANT
TRANSDUCER W/STOPCOCK (MISCELLANEOUS) IMPLANT
TUBING ART PRESS 72 MALE/FEM (TUBING) IMPLANT

## 2024-01-23 NOTE — Discharge Instructions (Signed)

## 2024-01-23 NOTE — Interval H&P Note (Signed)
 History and Physical Interval Note:  01/23/2024 10:03 AM  Samuel [name]  has presented today for surgery, with the diagnosis of hp.  The various methods of treatment have been discussed with the patient and family. After consideration of risks, benefits and other options for treatment, the patient has consented to  Procedure(s): RIGHT HEART CATH (N/A) as a surgical intervention.  The patient's history has been reviewed, patient examined, no change in status, stable for surgery.  I have reviewed the patient's chart and labs.  Questions were answered to the patient's satisfaction.     Samuel [name]

## 2024-01-23 NOTE — H&P (Signed)
 Televisit note 01/19/2024 copied for documentation     Virtual Visit via Telephone Note   Because of Samuel [name]'s co-morbid illnesses, he is at least at moderate risk for complications without adequate follow up.  This format is felt to be most appropriate for this patient at this time.  The patient did not have access to video technology/had technical difficulties with video requiring transitioning to audio format only (telephone).  All issues noted in this document were discussed and addressed.  No physical exam could be performed with this format.  Please refer to the patient's chart for his consent to telehealth for [facility].    Date:  01/23/2024   ID:  Samuel [name], DOB 15-Nov-1955, MRN [id number] The patient was identified using 2 identifiers.  Patient Location: Home Provider Location: Home Office   PCP:  Tally Joe, MD   [facility] Providers Cardiologist:  Elder Negus, MD     Evaluation Performed:  Follow-Up Visit  Chief Complaint:  Pulmonary hypertension  History of Present Illness:    Samuel [name] is a 68 y.o. male with multiple myeloma, interstitial lung disease, drug-induced pneumonitis, coronary calcification  Recently, patient's pulmonary fibrosis is currently with significantly increased cough symptoms. Dr. Marchelle Gearing is suspecting pulmonary hypertension given send respiratory failure noted on CT scan..  Therefore, referred back to me for consideration for right heart catheterization.    Review of Systems  Cardiovascular:  Negative for chest pain, dyspnea on exertion, leg swelling, palpitations and syncope.  Respiratory:  Positive for cough.     All other systems reviewed and are negative.   Prior CV studies:   The following studies were reviewed today:  Echocardiogram 07/01/2023: 1. Left ventricular ejection fraction, by estimation, is 70 to 75%. The  left ventricle has hyperdynamic function. The left ventricle has no   regional wall motion abnormalities. Left ventricular diastolic parameters  are consistent with Grade I diastolic  dysfunction (impaired relaxation).   2. Right ventricular systolic function is normal. The right ventricular  size is normal.   3. Left atrial size was mildly dilated.   4. Right atrial size was mildly dilated.   5. The mitral valve is normal in structure. Trivial mitral valve  regurgitation. No evidence of mitral stenosis.   6. The aortic valve is tricuspid. Aortic valve regurgitation is not  visualized. Aortic valve sclerosis is present, with no evidence of aortic  valve stenosis.    Labs/Other Tests and Data Reviewed:    Recent Labs: 09/12/2023: Pro B Natriuretic peptide (BNP) 35.0 12/31/2023: ALT 18; BUN 18; Creatinine 0.90; Hemoglobin 13.0; Platelet Count 263; Potassium 4.1; Sodium 132       Objective:    Vital Signs:   None. Televisit  Physical exam: Not performer. Telephone visit  ASSESSMENT & PLAN:    68 y/o male w/multiple myeloma, interstitial lung disease, drug-induced pneumonitis, coronary calcification  Pulmonary hypertension: Enlarged pulmonary artery, noted on CT scan in the setting of interstitial lung disease, concern for group 3 pulmonary hypertension.  Right heart catheterization.    I have reviewed the risks, indications, and alternatives to cardiac catheterization with the patient. Risks include but are not limited to bleeding, infection, vascular injury, stroke, myocardial infection, arrhythmia, kidney injury, radiation-related injury in the case of prolonged fluoroscopy use, emergency cardiac surgery, and death. The patient understands the risks of serious complication is 1-2 in 1000 with diagnostic cardiac cath and 1-2% or less with angioplasty/stenting.   Coronary calcification: Moderate  coronary calcification (CT chest 10/2022). No ischemic on stress testing (11/2022). Structurally normal heart (11/2022). No anginal symptoms. Exertional  dyspnea is most likely due to ILD.  Continue aspirin 81 mg daily given coronary calcification, along with statin.  He did not tolerate rosuvastatin due to myalgias, and wants to continue ezetimibe-simvastatin.  This is reasonable.        Informed Consent   Shared Decision Making/Informed Consent The risks [stroke (1 in 1000), death (1 in 1000), kidney failure [usually temporary] (1 in 500), bleeding (1 in 200), allergic reaction [possibly serious] (1 in 200)], benefits (diagnostic support and management of coronary artery disease) and alternatives of a cardiac catheterization were discussed in detail with Samuel [name] and he is willing to proceed.        Time:   Today, I have spent 15 minutes with the patient with telehealth technology discussing the above problems.      Signed, Elder Negus, MD  01/23/2024 9:54 AM    [facility]

## 2024-01-26 ENCOUNTER — Other Ambulatory Visit (HOSPITAL_BASED_OUTPATIENT_CLINIC_OR_DEPARTMENT_OTHER): Payer: Self-pay

## 2024-01-26 ENCOUNTER — Encounter: Payer: Self-pay | Admitting: Hematology and Oncology

## 2024-01-26 MED ORDER — HYDROCODONE BIT-HOMATROP MBR 5-1.5 MG/5ML PO SOLN
5.0000 mL | Freq: Four times a day (QID) | ORAL | 0 refills | Status: DC | PRN
  Filled 2024-01-26: qty 240, 12d supply, fill #0

## 2024-01-26 MED ORDER — HYDROCODONE BIT-HOMATROP MBR 5-1.5 MG/5ML PO SOLN: 5.0000 mL | Freq: Four times a day (QID) | ORAL | 0 refills | Status: DC | PRN

## 2024-01-26 NOTE — Telephone Encounter (Signed)
ssent

## 2024-01-27 ENCOUNTER — Other Ambulatory Visit: Payer: Self-pay

## 2024-01-27 ENCOUNTER — Encounter (HOSPITAL_COMMUNITY)
Admission: RE | Admit: 2024-01-27 | Discharge: 2024-01-27 | Disposition: A | Discharge status: Home / Self Care | Payer: Medicare Other | Source: Ambulatory Visit | Attending: Internal Medicine | Admitting: Internal Medicine

## 2024-01-27 DIAGNOSIS — J849 Interstitial pulmonary disease, unspecified: Secondary | ICD-10-CM | POA: Diagnosis present

## 2024-01-27 NOTE — Progress Notes (Signed)
 Home Exercise Prescription I have reviewed a Home Exercise Prescription with Samuel [name] is currently exercising at home. He walks on his treadmill at home 2-3 days/wk for 20-30 min/day. I encouraged him to exercise most days of the week. He agreed with my recommendations. [name] also mentioned using his local gym for resistance training. I recommended doing resistance exercises after aerobic exercise. [name] is very motivated to exercise and improve his functional capacity. I am him confident in him carrying out an exercise regimen at home. The patient stated that their goals were to improve health. We reviewed exercise guidelines, target heart rate during exercise, RPE Scale, weather conditions, endpoints for exercise, warmup and cool down. The patient is encouraged to come to me with any questions. I will continue to follow up with the patient to assist them with progression and safety. Spent 15 min with patient discussing home exercise plan and goals  Joya San, MS, ACSM-CEP 01/27/2024 9:39 AM

## 2024-01-27 NOTE — Progress Notes (Signed)
 Daily Session Note  Patient Details  Name: Samuel [name] MRN: [id number] Date of Birth: [birth year] Referring Provider:   Doristine Devoid Pulmonary Rehab Walk Test from 10/06/2023 in [facility]  Referring Provider Ramaswamy       Encounter Date: 01/27/2024  Check In:  Session Check In - 01/27/24 0093       Check-In   Supervising physician immediately available to respond to emergencies [facility] MD immediately available    Physician(s) Rise Paganini, NP    Location [facility]    Staff Present Raford Pitcher, MS, ACSM-CEP, Exercise Physiologist;Randi Dionisio Paschal, ACSM-CEP, Exercise Physiologist;Mary Gerre Scull, RN, Fuller Plan, RT    Virtual Visit No    Medication changes reported     No    Fall or balance concerns reported    No    Tobacco Cessation No Change    Warm-up and Cool-down Performed as group-led instruction    Resistance Training Performed Yes    VAD Patient? No    PAD/SET Patient? No      Pain Assessment   Currently in Pain? No/denies    Multiple Pain Sites No             Capillary Blood Glucose: No results found for this or any previous visit (from the past 24 hours).    Social History   Tobacco Use  Smoking Status Former   Current packs/day: 0.00   Average packs/day: 0.1 packs/day for 3.0 years (0.3 ttl pk-yrs)   Types: Cigarettes   Start date: 10/22/1979   Quit date: 10/21/1982   Years since quitting: 41.2  Smokeless Tobacco Never    Goals Met:  Proper associated with RPD/PD & O2 Sat Independence with exercise equipment Exercise tolerated well No report of concerns or symptoms today Strength training completed today  Goals Unmet:  Not Applicable  Comments: Service time is from 0808 to 0926.    Dr. Mechele Collin is Medical Director for Pulmonary Rehab at [facility].

## 2024-01-29 ENCOUNTER — Encounter (HOSPITAL_COMMUNITY)
Admission: RE | Admit: 2024-01-29 | Discharge: 2024-01-29 | Disposition: A | Discharge status: Home / Self Care | Payer: Medicare Other | Source: Ambulatory Visit | Attending: Internal Medicine

## 2024-01-29 ENCOUNTER — Telehealth (HOSPITAL_COMMUNITY): Payer: Self-pay

## 2024-01-29 ENCOUNTER — Inpatient Hospital Stay (HOSPITAL_BASED_OUTPATIENT_CLINIC_OR_DEPARTMENT_OTHER): Payer: Medicare Other | Admitting: Hematology and Oncology

## 2024-01-29 ENCOUNTER — Inpatient Hospital Stay: Payer: Medicare Other

## 2024-01-29 ENCOUNTER — Telehealth: Payer: Self-pay | Admitting: Adult Health

## 2024-01-29 VITALS — BP 114/72 | HR 64 | Temp 97.5°F | Resp 18 | Wt 201.2 lb

## 2024-01-29 DIAGNOSIS — Z7969 Long term (current) use of other immunomodulators and immunosuppressants: Secondary | ICD-10-CM | POA: Insufficient documentation

## 2024-01-29 DIAGNOSIS — C9000 Multiple myeloma not having achieved remission: Secondary | ICD-10-CM

## 2024-01-29 DIAGNOSIS — Z79624 Long term (current) use of inhibitors of nucleotide synthesis: Secondary | ICD-10-CM | POA: Insufficient documentation

## 2024-01-29 DIAGNOSIS — J849 Interstitial pulmonary disease, unspecified: Secondary | ICD-10-CM

## 2024-01-29 DIAGNOSIS — Z79899 Other long term (current) drug therapy: Secondary | ICD-10-CM | POA: Insufficient documentation

## 2024-01-29 DIAGNOSIS — Z87891 Personal history of nicotine dependence: Secondary | ICD-10-CM | POA: Insufficient documentation

## 2024-01-29 DIAGNOSIS — J9611 Chronic respiratory failure with hypoxia: Secondary | ICD-10-CM

## 2024-01-29 DIAGNOSIS — Z7961 Long term (current) use of immunomodulator: Secondary | ICD-10-CM | POA: Insufficient documentation

## 2024-01-29 LAB — CMP (CANCER CENTER ONLY)
ALT: 17 U/L (ref 0–44)
AST: 25 U/L (ref 15–41)
Albumin: 4.1 g/dL (ref 3.5–5.0)
Alkaline Phosphatase: 55 U/L (ref 38–126)
Anion gap: 5 (ref 5–15)
BUN: 20 mg/dL (ref 8–23)
CO2: 32 mmol/L (ref 22–32)
Calcium: 9.5 mg/dL (ref 8.9–10.3)
Chloride: 98 mmol/L (ref 98–111)
Creatinine: 0.9 mg/dL (ref 0.61–1.24)
GFR, Estimated: >60 mL/min (ref >60)
Glucose, Bld: 102 mg/dL — ABNORMAL HIGH (ref 70–99)
Potassium: 4.5 mmol/L (ref 3.5–5.1)
Sodium: 135 mmol/L (ref 135–145)
Total Bilirubin: 0.4 mg/dL (ref 0.0–1.2)
Total Protein: 8.7 g/dL — ABNORMAL HIGH (ref 6.5–8.1)

## 2024-01-29 LAB — CBC WITH DIFFERENTIAL (CANCER CENTER ONLY)
Abs Immature Granulocytes: 0.02 10*3/uL (ref 0.00–0.07)
Basophils Absolute: 0 10*3/uL (ref 0.0–0.1)
Basophils Relative: 0 %
Eosinophils Absolute: 0.1 10*3/uL (ref 0.0–0.5)
Eosinophils Relative: 1 %
HCT: 39.7 % (ref 39.0–52.0)
Hemoglobin: 13.3 g/dL (ref 13.0–17.0)
Immature Granulocytes: 0 %
Lymphocytes Relative: 13 %
Lymphs Abs: 1.2 10*3/uL (ref 0.7–4.0)
MCH: 30.4 pg (ref 26.0–34.0)
MCHC: 33.5 g/dL (ref 30.0–36.0)
MCV: 90.8 fL (ref 80.0–100.0)
Monocytes Absolute: 0.5 10*3/uL (ref 0.1–1.0)
Monocytes Relative: 5 %
Neutro Abs: 7.3 10*3/uL (ref 1.7–7.7)
Neutrophils Relative %: 81 %
Platelet Count: 317 10*3/uL (ref 150–400)
RBC: 4.37 MIL/uL (ref 4.22–5.81)
RDW: 12.2 % (ref 11.5–15.5)
WBC Count: 9.2 10*3/uL (ref 4.0–10.5)
nRBC: 0 % (ref 0.0–0.2)

## 2024-01-29 LAB — LACTATE DEHYDROGENASE: LDH: 261 U/L — ABNORMAL HIGH (ref 98–192)

## 2024-01-29 NOTE — Telephone Encounter (Signed)
 Ordered

## 2024-01-29 NOTE — Progress Notes (Signed)
 [facility] [phone]   [phone]  ONCOLOGY PROGRESS NOTE  Patient Care Team: Tally Joe, MD as PCP - General (Family Medicine) Elder Negus, MD as PCP - Cardiology (Cardiology)  Hematological/Oncological History # IgG Kappa Multiple Myeloma 02/02/2021:  Presented to [facility] due to right sided flank tenderness with bruising. CT abdomen/pelvis: Multiple small lytic lesions in the thoracolumbar spine and bilateral pelvis -SPEP: IgG 2,082 (H), M Protein 1.8 (H). IFE shows IgG monoclonal protein with kappa light chain specificity.  -LDH 169, CBC normal, CMP normal except for sodium 131 (L), Chloride 96 (L).   02/14/2021: Establish care with Georga Kaufmann PA-C 02/22/2021: bone marrow biopsy confirms the diagnosis of Multiple Myeloma with a monoclonal plasma cell population.  03/16/2021: Cycle 1 Day 1 of VRd chemotherapy  04/06/2021: Cycle 2 Day 1 of VRd chemotherapy  04/27/2021: Cycle 3 Day 1 of VRd chemotherapy  05/18/2021: Cycle 4 Day 1 of VRd chemotherapy  06/01/2021: drop dexamethasone to 20mg  PO weekly and start lasix due to shortness of breath.  06/15/2021: Desaturation to 87% on ambulation. HELD velcade today and sent to ED for evaluation.  06/22/2021: Findings consistent with drug reaction the lungs with eosinophils on BAL.  Given these findings we will definitely hold [name] and plan to avoid pomalidomide 06/29/2021: Cycle 1 Day 1 CyBorD chemotherapy 07/05/2021: HOLD chemotherapy given worsening lung function  Interval History:  Samuel [name] 68 y.o. male with medical history significant for IgG Kappa multiple myeloma who presents for a follow up visit. The patient's last visit was on 11/06/2023. He is accompanied by his wife for this visit.  Mr. Walth reports he continues to struggle with respiratory issues.  He feels like his coronary disease is progressing.  He is having issues with congestion and "the pollen has not helped".  He reports  lately he feels like a "mucus machine".  He is currently using his oxygen 24 hours a day.  He did recently have an evaluation with ENT in the right heart cath which did show borderline pulmonary hypertension.  He reports that he also went to pulmonary rehab today.  He is doing his best ready lot of protein including red meat, fish, chicken.  He reports he is had no infectious symptoms such as runny nose or sore throat.Marland Kitchen  He denies fevers, chills, night sweats, chest pain or cough. He has no other complaints. Rest of the 10 point ROS is listed below.  Today we had a detailed discussion about steps moving forward.  We noted that his M protein is elevated (but fortunately improving) and he does require start of treatment at some point, however given his unstable lung function this would be too risky to begin at this time.  He voices understanding and wanted to continue monitoring at this time.   MEDICAL HISTORY:  Past Medical History:  Diagnosis Date   Asthma    GERD (gastroesophageal reflux disease)    High cholesterol    under control.    History of blood transfusion    Hypothyroidism    Interstitial lung disease (HCC)    Multiple myeloma (HCC)    Perennial allergic rhinitis    Pneumonia    Sciatic pain, right    Seasonal allergic rhinitis    Thyroid disease     SURGICAL HISTORY: Past Surgical History:  Procedure Laterality Date   BRONCHIAL BIOPSY  06/18/2021   Procedure: BRONCHIAL BIOPSIES;  Surgeon: Leslye Peer, MD;  Location: [facility];  Service: Cardiopulmonary;;   BRONCHIAL BIOPSY  08/28/2021   Procedure: BRONCHIAL BIOPSIES;  Surgeon: Josephine Igo, DO;  Location: [facility];  Service: Cardiopulmonary;;   BRONCHIAL WASHINGS  06/18/2021   Procedure: BRONCHIAL WASHINGS;  Surgeon: Leslye Peer, MD;  Location: [facility];  Service: Cardiopulmonary;;   BRONCHIAL WASHINGS  08/28/2021   Procedure: BRONCHIAL WASHINGS;  Surgeon: Josephine Igo, DO;  Location: [facility];   Service: Cardiopulmonary;;   NASAL SINUS SURGERY     NECK SURGERY  1996   RIGHT HEART CATH N/A 01/23/2024   Procedure: RIGHT HEART CATH;  Surgeon: Elder Negus, MD;  Location: [facility];  Service: Cardiovascular;  Laterality: N/A;   VIDEO BRONCHOSCOPY N/A 06/18/2021   Procedure: VIDEO BRONCHOSCOPY WITH FLUORO;  Surgeon: Leslye Peer, MD;  Location: [facility];  Service: Cardiopulmonary;  Laterality: N/A;   VIDEO BRONCHOSCOPY N/A 08/28/2021   Procedure: VIDEO BRONCHOSCOPY WITH FLUORO;  Surgeon: Josephine Igo, DO;  Location: [facility];  Service: Cardiopulmonary;  Laterality: N/A;    SOCIAL HISTORY: Social History   Socioeconomic History   Marital status: Married    Spouse name: Not on file   Number of children: 3   Years of education: Not on file   Highest education level: Bachelor's degree (e.g., BA, AB, BS)  Occupational History   Not on file  Tobacco Use   Smoking status: Former    Current packs/day: 0.00    Average packs/day: 0.1 packs/day for 3.0 years (0.3 ttl pk-yrs)    Types: Cigarettes    Start date: 10/22/1979    Quit date: 10/21/1982    Years since quitting: 41.3   Smokeless tobacco: Never  Vaping Use   Vaping status: Never Used  Substance and Sexual Activity   Alcohol use: Not Currently    Comment: 1 drink daily   Drug use: No   Sexual activity: Not on file  Other Topics Concern   Not on file  Social History Narrative   Not on file   Social Drivers of Health   Financial Resource Strain: Low Risk  (03/15/2022)   Received from [facility], [facility]   Overall Financial Resource Strain (CARDIA)    Difficulty of Paying Living Expenses: Not hard at all  Food Insecurity: Low Risk  (01/14/2024)   Received from [facility]   Hunger Vital Sign    Worried About Running Out of Food in the Last Year: Never true    Ran Out of Food in the Last Year: Never true  Transportation Needs: No Transportation Needs (01/14/2024)   Received from [employer]    In the past 12 months, has lack of reliable transportation kept you from medical appointments, meetings, work or from getting things needed for daily living? : No  Physical Activity: Insufficiently Active (01/10/2022)   Received from [facility], [facility]   Exercise Vital Sign    Days of Exercise per Week: 3 days    Minutes of Exercise per Session: 30 min  Stress: No Stress Concern Present (01/10/2022)   Received from [facility], [facility] of Occupational Health - Occupational Stress Questionnaire    Feeling of Stress : Only a little  Social Connections: Socially Integrated (01/10/2022)   Received from [facility], [facility]   Social Connection and Isolation Panel [NHANES]    Frequency of Communication with Friends and Family: More than three times a week    Frequency of  Social Gatherings with Friends and Family: Twice a week    Attends Religious Services: 1 to 4 times per year    Active Member of [employer] or Organizations: Yes    Attends Engineer, structural: More than 4 times per year    Marital Status: Married  Catering manager Violence: Not At Risk (03/15/2022)   Received from [facility], [facility]   Humiliation, Afraid, Rape, and Kick questionnaire    Fear of Current or Ex-Partner: No    Emotionally Abused: No    Physically Abused: No    Sexually Abused: No    FAMILY HISTORY: Family History  Problem Relation Age of Onset   Hypertension Mother    Allergies Mother    Allergies Father    CAD Father 33   Breast cancer Paternal Grandmother    Hypertension Other    Heart disease Other     ALLERGIES:  is allergic to clarithromycin, ofev [nintedanib], and penicillins.  MEDICATIONS:  Current Outpatient Medications  Medication Sig Dispense Refill   levothyroxine (SYNTHROID) 50 MCG tablet Take 50 mcg by mouth every morning.     acyclovir (ZOVIRAX) 400 MG tablet Take 1 tablet (400 mg total) by mouth 2 (two) times  daily. (Patient taking differently: Take 400 mg by mouth daily.) 60 tablet 3   albuterol (PROVENTIL) (2.5 MG/3ML) 0.083% nebulizer solution USE ONE VIAL IN NEBULIZER EVERY 6 HOURS AS NEEDED WHEEZING OR SHORTNESS OF BREATH 120 mL 12   albuterol (VENTOLIN HFA) 108 (90 Base) MCG/ACT inhaler Inhale 2 puffs into the lungs every 6 hours as needed for wheezing or shortness of breath. 8.5 g 1   ALPRAZolam (XANAX) 0.5 MG tablet Take 0.5 mg by mouth at bedtime.     ASSESS FULL RANGE PEAK METER DEVI as directed.     azelastine (ASTELIN) 0.1 % nasal spray Place 2 sprays into both nostrils 2 (two) times daily. Use in each nostril as directed (Patient taking differently: Place 2 sprays into both nostrils 2 (two) times daily as needed for allergies or rhinitis. Use in each nostril as directed) 30 mL 12   bisoprolol (ZEBETA) 5 MG tablet Take 1 tablet (5 mg total) by mouth daily. 30 tablet 2   Budeson-Glycopyrrol-Formoterol (BREZTRI AEROSPHERE) 160-9-4.8 MCG/ACT AERO Inhale 2 puffs into the lungs in the morning and at bedtime. 10.7 g 11   cetirizine (ZYRTEC) 10 MG tablet Take 1 tablet (10 mg total) by mouth daily. 30 tablet 11   cholecalciferol (VITAMIN D) 1000 UNITS tablet Take 3,000 Units by mouth daily.     dextromethorphan (DELSYM) 30 MG/5ML liquid Take 30 mg by mouth as needed for cough.     EPINEPHrine (EPI-PEN) 0.3 mg/0.3 mL DEVI Inject 0.3 mg into the muscle as needed.     ezetimibe-simvastatin (VYTORIN) 10-40 MG tablet Take 1 tablet by mouth daily. 90 tablet 3   fluticasone (FLONASE) 50 MCG/ACT nasal spray Place 1 spray into both nostrils daily as needed for allergies or rhinitis.     HYDROcodone bit-homatropine (HYCODAN) 5-1.5 MG/5ML syrup Take 5 mLs by mouth every 6 (six) hours as needed for cough. 240 mL 0   ibuprofen (ADVIL) 200 MG tablet Take 200 mg by mouth every 6 (six) hours as needed for moderate pain (pain score 4-6).     ketoconazole (NIZORAL) 2 % cream Apply 1 Application topically 2 (two) times  daily as needed for irritation.     liothyronine (CYTOMEL) 5 MCG tablet Take 5 mcg by mouth daily.  montelukast (SINGULAIR) 10 MG tablet Take 10 mg by mouth at bedtime.     Multiple Vitamin (MULTIVITAMIN) tablet Take 1 tablet by mouth daily.     omeprazole (PRILOSEC) 40 MG capsule Take 1 capsule (40 mg total) by mouth 2 (two) times daily. Take 30 minutes before a meal 60 capsule 3   OXYGEN Inhale 2 L into the lungs continuous.     predniSONE (DELTASONE) 5 MG tablet Daily for 3 weeks and then every other day (Patient taking differently: Take 5 mg by mouth daily with breakfast. Daily for 3 weeks and then every other day) 30 tablet 1   sildenafil (REVATIO) 20 MG tablet Take 20 mg by mouth daily as needed (ED).     Simethicone (SIMETHICONE ULTRA STRENGTH) 180 MG CAPS Take 1 capsule (180 mg total) by mouth 3 (three) times daily as needed. 90 capsule 0   sodium chloride (OCEAN) 0.65 % SOLN nasal spray Place 1 spray into both nostrils as needed for congestion.     tamsulosin (FLOMAX) 0.4 MG CAPS capsule Take 0.4 mg by mouth daily.     triamcinolone cream (KENALOG) 0.1 % Apply 1 application topically daily as needed (sun burn itch).     Turmeric 400 MG CAPS Take 1 capsule by mouth daily.     No current facility-administered medications for this visit.    REVIEW OF SYSTEMS:   Constitutional: ( - ) fevers, ( - )  chills , ( - ) night sweats Eyes: ( - ) blurriness of vision, ( - ) double vision, ( - ) watery eyes Ears, nose, mouth, throat, and face: ( - ) mucositis, ( - ) sore throat Respiratory: ( - ) cough, ( + ) dyspnea, ( - ) wheezes Cardiovascular: ( - ) palpitation, ( - ) chest discomfort, ( - ) lower extremity swelling Gastrointestinal:  ( - ) nausea, ( - ) heartburn, ( - ) change in bowel habits Skin: ( - ) abnormal skin rashes Lymphatics: ( - ) new lymphadenopathy, ( - ) easy bruising Neurological: ( - ) numbness, ( - ) tingling, ( - ) new weaknesses Behavioral/Psych: ( - ) mood change, (  - ) new changes  All other systems were reviewed with the patient and are negative.  PHYSICAL EXAMINATION: ECOG PERFORMANCE STATUS: 1 - Symptomatic but completely ambulatory  GENERAL: well appearing middle-aged Caucasian male in NAD  SKIN: skin color, texture, turgor are normal, no rashes or significant lesions EYES: conjunctiva are pink and non-injected, sclera clear LUNGS: clear to auscultation and percussion with normal breathing effort HEART: regular rate & rhythm and no murmurs and no lower extremity edema Musculoskeletal: no cyanosis of digits and no clubbing  PSYCH: alert & oriented x 3, fluent speech NEURO: no focal motor/sensory deficits   LABORATORY DATA:  I have reviewed the data as listed    Latest Ref Rng & Units 01/29/2024   10:22 AM 01/23/2024   10:20 AM 12/31/2023    8:15 AM  CBC  WBC 4.0 - 10.5 K/uL 9.2   7.7   Hemoglobin 13.0 - 17.0 g/dL 62.1  30.8    65.7  84.6   Hematocrit 39.0 - 52.0 % 39.7  36.0    37.0  38.8   Platelets 150 - 400 K/uL 317   263        Latest Ref Rng & Units 01/29/2024   10:22 AM 01/23/2024   10:20 AM 12/31/2023    8:15 AM  CMP  Glucose 70 -  99 mg/dL 161   96   BUN 8 - 23 mg/dL 20   18   Creatinine 0.96 - 1.24 mg/dL 0.45   4.09   Sodium 811 - 145 mmol/L 135  136    136  132   Potassium 3.5 - 5.1 mmol/L 4.5  4.1    4.2  4.1   Chloride 98 - 111 mmol/L 98   98   CO2 22 - 32 mmol/L 32   31   Calcium 8.9 - 10.3 mg/dL 9.5   8.7   Total Protein 6.5 - 8.1 g/dL 8.7   7.4   Total Bilirubin 0.0 - 1.2 mg/dL 0.4   0.4   Alkaline Phos 38 - 126 U/L 55   50   AST 15 - 41 U/L 25   23   ALT 0 - 44 U/L 17   18     Lab Results  Component Value Date   MPROTEIN 0.5 (H) 12/31/2023   MPROTEIN 0.6 (H) 12/03/2023   MPROTEIN 0.6 (H) 11/06/2023   Lab Results  Component Value Date   KPAFRELGTCHN 46.8 (H) 12/31/2023   KPAFRELGTCHN 37.4 (H) 12/03/2023   KPAFRELGTCHN 34.1 (H) 11/06/2023   LAMBDASER 22.7 12/31/2023   LAMBDASER 15.6 12/03/2023    LAMBDASER 13.4 11/06/2023   KAPLAMBRATIO 2.06 (H) 12/31/2023   KAPLAMBRATIO 2.40 (H) 12/03/2023   KAPLAMBRATIO 2.54 (H) 11/06/2023    RADIOGRAPHIC STUDIES: I have personally reviewed the radiological images as listed and agreed with the findings in the report: lytic lesions in the hip bones bilaterally.  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 male with medical history significant for IgG Kappa multiple myeloma who presents for a follow up visit.   After review of the labs, review of the records, and discussion with the patient the findings are most consistent with an IgG kappa multiple myeloma.  The patient has 2 lytic lesions within the pelvic bones (more noted in spine on CT scan) as well as 10% plasma cells within the bone marrow biopsy.  This combined with his serological findings confirm the diagnosis of multiple myeloma.  The initial treatment of choice for this patient's multiple myeloma was VRd. This will consist of bortezomib 1.5mg /m2 on days 1, 8, 15, dexamethasone 40mg  on days 1,8, and 15, and revlimid 25mg  PO daily days 1-14. Cycles will consist of 21 days. We will use this regimen to stabilize the patient's myeloma, then make a referral to a BMT center of his choosing for consideration of a bone marrow transplant once he has been noted to have a good response (VGPR or better). Zometa was started after dental clearance was obtained. This will be continued x 2 years.   Unfortunately had a drug reaction to [name] and was admitted to the hospital from 06/15/2021 until 06/18/2021.  The patient underwent a B AL which clearly  showed evidence of eosinophils.  This is consistent with a drug reaction most likely caused by the patient's [name].  Discussed the case with pulmonology who recommended that we not rechallenge for attempt other drugs in the same class such as pomalidomide.  Given the patient's excellent response to [facility] therapy might preference would be to continue that.  As such I would recommend we proceed with CyBorD chemotherapy.  Daratumumab-based therapy could have been considered, but is often paired with an immunologic.  Additionally I would like to preserve those for additional lines of therapy if necessary.  Therefore we will proceed with CyBorD chemotherapy have the patient referred to transplant.  R-IPSS score: Stage 2. PFS of 42 months  # IgG Kappa Multiple Myeloma (t11;14, standard risk)  --diagnostic criteria was met with monoclonal plasma cells in the bone marrow and lytic lesions on the bone --recommend proceeding with VRd chemotherapy as noted above --patient is young and healthy enough for consideration of BMT, though he is borderline with his age. Will consider referral pending response to treatment.  -- Cycle 1 Day 1 started on 03/16/2021 --decreased dexamethasone to 20mg  PO weekly due to fluid overload. Also started lasix 20mg  PO (changed on 06/01/2021) --plan to HOLD revlimid moving forward after evidence of drug reaction in the lungs. --started CyBorD chemotherapy on 06/29/2021 but held starting 07/05/2021 given worsening lung function --patient has reached a VGPR.  Plan: --Labs from today were reviewed with patient. CBC is unremarkable without any cytopenias. Creatinine and calcium levels are normal. Myeloma panel pending today --M protein is stable, with last myeloma panel from 10/08/2023 measuring M protein at 0.6 g/dL. --Discussed the need to start treatment, however given his worsening lung function this would be inadvisable at this time. --[facility] recommended  Dara/[name]/dexamethasone which we agree is a reasonable regimen. Patient was aware of the risks and benefits regarding possible worsening of the lung function. --Discussed the risk of waiting to resume treatment including end organ damage including cytopenias, renal dysfunction and pathologic fractures.  --Labs today show white blood cell 9.2, hemoglobin 13.3, MCV 90.8, platelets 317. Cr0.90 --RTC in 12 weeks with interval 4 week with labs   # Drug Reaction in the Lung --confirmed with increased eosinophils on BAL during admission for shortness of breath --on pirfenidone and steroid taper managed by pulmonologist, Dr. Marchelle Gearing.  # Supportive Care -- chemotherapy education complete --zometa therapy started on 04/05/2021 (4 mg IV q 3 months), treatment on hold per patient's request. -- zofran 8mg  q8H PRN and compazine 10mg  PO q6H for nausea -- acyclovir 400mg  PO BID for VCZ prophylaxis -- no pain medication required at this time.   No orders of the defined types were placed in this encounter.  All questions were answered. The patient knows to call the clinic with any problems, questions or concerns.  I have spent a total of 30 minutes minutes of face-to-face and non-face-to-face time, preparing to see the patient,performing a medically appropriate examination, counseling and educating the patient,  documenting clinical information in the electronic health record,  and care coordination.   Ulysees Barns, MD Department of Hematology/Oncology [facility] at [facility] Phone: [phone] Pager: [phone] Email: [email or web address]    01/29/2024 2:02 PM

## 2024-01-29 NOTE — Telephone Encounter (Signed)
-----   Message from Thornton sent at 01/29/2024  1:57 PM EDT ----- Regarding: RE: Oxymizer order Yes order sent . ----- Message ----- From: Essie Hart, RN Sent: 01/29/2024   9:00 AM EDT To: Julio Sicks, NP; Kalman Shan, MD Subject: Oxymizer order                                 Can you please order him an oxymizer for Pulmonary Rehab? He is wearing 8-10L for exercise.

## 2024-01-29 NOTE — Progress Notes (Signed)
 Daily Session Note  Patient Details  Name: Samuel [name] MRN: [id number] Date of Birth: Oct 07, 1956 Referring Provider:   Doristine Devoid Pulmonary Rehab Walk Test from 10/06/2023 in [facility]  Referring Provider Ramaswamy       Encounter Date: 01/29/2024  Check In:  Session Check In - 01/29/24 0815       Check-In   Supervising physician immediately available to respond to emergencies [facility] MD immediately available    Physician(s) Robin Searing, NP    Location [facility]    Staff Present Raford Pitcher, MS, ACSM-CEP, Exercise Physiologist;Cledith Abdou Erin Sons BS, ACSM-CEP, Exercise Physiologist;Mary Gerre Scull, RN, BSN    Virtual Visit No    Medication changes reported     No    Fall or balance concerns reported    No    Tobacco Cessation No Change    Warm-up and Cool-down Performed as group-led instruction    Resistance Training Performed Yes    VAD Patient? No    PAD/SET Patient? No      Pain Assessment   Currently in Pain? No/denies    Multiple Pain Sites No             Capillary Blood Glucose: No results found for this or any previous visit (from the past 24 hours).    Social History   Tobacco Use  Smoking Status Former   Current packs/day: 0.00   Average packs/day: 0.1 packs/day for 3.0 years (0.3 ttl pk-yrs)   Types: Cigarettes   Start date: 10/22/1979   Quit date: 10/21/1982   Years since quitting: 41.3  Smokeless Tobacco Never    Goals Met:  Proper associated with RPD/PD & O2 Sat Independence with exercise equipment Exercise tolerated well No report of concerns or symptoms today Strength training completed today  Goals Unmet:  Not Applicable  Comments: Service time is from 0807 to 0930.    Dr. Mechele Collin is Medical Director for Pulmonary Rehab at [facility].

## 2024-01-29 NOTE — Telephone Encounter (Signed)
-----   Message from Nurse Corrie Dandy B sent at 01/08/2024  8:44 AM EDT ----- Regarding: Oxymizer order Can you please order him an oxymizer for Pulmonary Rehab? He is wearing 8-10L for exercise.

## 2024-01-30 ENCOUNTER — Telehealth: Payer: Self-pay | Admitting: Internal Medicine

## 2024-01-30 LAB — KAPPA/LAMBDA LIGHT CHAINS
Kappa free light chain: 55.2 mg/L — ABNORMAL HIGH (ref 3.3–19.4)
Kappa, lambda light chain ratio: 2.36 — ABNORMAL HIGH (ref 0.26–1.65)
Lambda free light chains: 23.4 mg/L (ref 5.7–26.3)

## 2024-01-30 NOTE — Telephone Encounter (Signed)
 Your schedule has been filled except for 15 min slot at 4 pm 4/17- I will hold. Are you okay to use this slot? "

## 2024-01-30 NOTE — Telephone Encounter (Signed)
 There is 4pm x 30 min avilble

## 2024-01-30 NOTE — Telephone Encounter (Signed)
 [name] had right heart catheterization.  I will let him know via email that this is borderline and unlikely insurance will approve time also but we can try.  Can you please start paperwork for this.  I am copying pharmacy team as well.  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

## 2024-02-01 LAB — MULTIPLE MYELOMA PANEL, SERUM
Albumin SerPl Elph-Mcnc: 3.5 g/dL (ref 2.9–4.4)
Albumin/Glob SerPl: 0.9 (ref 0.7–1.7)
Alpha 1: 0.3 g/dL (ref 0.0–0.4)
Alpha2 Glob SerPl Elph-Mcnc: 1 g/dL (ref 0.4–1.0)
B-Globulin SerPl Elph-Mcnc: 1 g/dL (ref 0.7–1.3)
Gamma Glob SerPl Elph-Mcnc: 1.9 g/dL — ABNORMAL HIGH (ref 0.4–1.8)
Globulin, Total: 4.3 g/dL — ABNORMAL HIGH (ref 2.2–3.9)
IgA: 353 mg/dL (ref 61–437)
IgG (Immunoglobin G), Serum: 2264 mg/dL — ABNORMAL HIGH (ref 603–1613)
IgM (Immunoglobulin M), Srm: 120 mg/dL (ref 20–172)
M Protein SerPl Elph-Mcnc: 0.6 g/dL — ABNORMAL HIGH
Total Protein ELP: 7.8 g/dL (ref 6.0–8.5)

## 2024-02-02 ENCOUNTER — Telehealth: Payer: Self-pay | Admitting: Pharmacist

## 2024-02-02 ENCOUNTER — Other Ambulatory Visit: Payer: Self-pay | Admitting: Hematology and Oncology

## 2024-02-02 NOTE — Telephone Encounter (Signed)
 Patient will be new start to [name] DPI. May not meet criteria to qualify  Submitted a Prior Authorization request to [employer] for [name] DPI via CoverMyMeds. Will update once we receive a response.  Key: [id number]   [name] referral form placed on Dr. Mardell Shade computer desk area in [place] since he has clinic tomorrow. There are portions of application tha t

## 2024-02-02 NOTE — Telephone Encounter (Signed)
 Pt is scheduled 4/17 at 4:00. Pt is aware and voiced his understanding.  Nothing further needed.

## 2024-02-03 ENCOUNTER — Encounter (HOSPITAL_COMMUNITY)
Admission: RE | Admit: 2024-02-03 | Discharge: 2024-02-03 | Disposition: A | Discharge status: Home / Self Care | Payer: Medicare Other | Source: Ambulatory Visit | Attending: Internal Medicine

## 2024-02-03 ENCOUNTER — Encounter: Payer: Self-pay | Admitting: Hematology and Oncology

## 2024-02-03 VITALS — Wt 215.4 lb

## 2024-02-03 DIAGNOSIS — J849 Interstitial pulmonary disease, unspecified: Secondary | ICD-10-CM

## 2024-02-03 NOTE — Progress Notes (Signed)
 Daily Session Note  Patient Details  Name: Samuel [name] MRN: [id number] Date of Birth: 1956/09/21 Referring Provider:   Gattis Kass Pulmonary Rehab Walk Test from 10/06/2023 in [facility]  Referring Provider Ramaswamy       Encounter Date: 02/03/2024  Check In:  Session Check In - 02/03/24 4742       Check-In   Supervising physician immediately available to respond to emergencies [facility] MD immediately available    Physician(s) Palmer Bobo, NP    Location [facility]    Staff Present Atlas Lea, MS, ACSM-CEP, Exercise Physiologist;Casey Mayme Spearman BS, ACSM-CEP, Exercise Physiologist;Jaclin Finks Arlester Ladd, RN, BSN    Virtual Visit No    Medication changes reported     No    Fall or balance concerns reported    No    Tobacco Cessation No Change    Warm-up and Cool-down Performed as group-led instruction    Resistance Training Performed Yes    VAD Patient? No    PAD/SET Patient? No      Pain Assessment   Currently in Pain? No/denies    Multiple Pain Sites No             Capillary Blood Glucose: No results found for this or any previous visit (from the past 24 hours).   Exercise Prescription Changes - 02/03/24 0900       Response to Exercise   Blood Pressure (Admit) 130/80    Blood Pressure (Exercise) 148/82    Blood Pressure (Exit) 108/70    Heart Rate (Admit) 97 bpm   used albuterol tx   Heart Rate (Exercise) 132 bpm    Heart Rate (Exit) 99 bpm    Oxygen Saturation (Admit) 100 %   8L   Oxygen Saturation (Exercise) 94 %   10L   Oxygen Saturation (Exit) 99 %   10L   Rating of Perceived Exertion (Exercise) 12    Perceived Dyspnea (Exercise) 1    Duration Continue with 30 min of aerobic exercise without signs/symptoms of physical distress.    Intensity THRR unchanged      Progression   Progression Continue to progress workloads to maintain intensity without signs/symptoms of  physical distress.      Resistance Training   Training Prescription Yes    Weight black bands    Reps 10-15    Time 10 Minutes      Oxygen   Oxygen Continuous    Liters 8-15L      Treadmill   MPH --   1.4 decreased to 1   Grade 0    Minutes 15    METs 1.7      Recumbant Elliptical   Level 3    RPM 48    Watts 66    Minutes 15    METs 2.7      Oxygen   Maintain Oxygen Saturation 88% or higher             Social History   Tobacco Use  Smoking Status Former   Current packs/day: 0.00   Average packs/day: 0.1 packs/day for 3.0 years (0.3 ttl pk-yrs)   Types: Cigarettes   Start date: 10/22/1979   Quit date: 10/21/1982   Years since quitting: 41.3  Smokeless Tobacco Never    Goals Met:  Exercise tolerated well No report of concerns or symptoms today Strength training completed today  Goals Unmet:  O2 Sat, Pt had  to be titrated up to 15L after coughing bout. Pt refused to let staff titrate oxygen back down to baseline  Comments: Service time is from 0810 to 0924    Dr. Genetta Kenning is Medical Director for Pulmonary Rehab at [facility]

## 2024-02-03 NOTE — Progress Notes (Signed)
  PSYCHOSOCIAL ASSESSMENT   Patient psychosocial assessment reveals patient shows fair coping skills with neutral outlook. Mental health barriers to rehab participation identified. Offered emotional support and reassurance. Agreeable for ambulatory referral to [facility] with verbalizing understanding this is a separate cost from Cardiac/Pulmonary rehab and there may be a financial obligation depending upon their insurance plan coverage.  PHQ9 - 4  Pt having health related anxiety due to new dx of pulm HTN and starting treatment.

## 2024-02-03 NOTE — Telephone Encounter (Signed)
 Received notification from [facility] regarding a prior authorization for [name] DPI. Authorization has been APPROVED from 02/02/2024 to 10/20/2024. Approval letter sent to scan center.  Authorization # [id number]  Geraldene Kleine, PharmD, MPH, BCPS, CPP Clinical Pharmacist (Rheumatology and Pulmonology)

## 2024-02-04 NOTE — Progress Notes (Signed)
 Pulmonary Individual Treatment Plan  Patient Details  Name: Samuel [name] MRN: [id number] Date of Birth: 15-May-1956 Referring Provider:   Doristine Devoid Pulmonary Rehab Walk Test from 10/06/2023 in [facility]  Referring Provider Ramaswamy       Initial Encounter Date:  Flowsheet Row Pulmonary Rehab Walk Test from 10/06/2023 in [facility]  Date 10/06/23       Visit Diagnosis: ILD (interstitial lung disease) (HCC)  Patient's Home Medications on Admission:   Current Outpatient Medications:    acyclovir (ZOVIRAX) 400 MG tablet, Take 1 tablet (400 mg total) by mouth 2 (two) times daily. (Patient taking differently: Take 400 mg by mouth daily.), Disp: 60 tablet, Rfl: 3   albuterol (PROVENTIL) (2.5 MG/3ML) 0.083% nebulizer solution, USE ONE VIAL IN NEBULIZER EVERY 6 HOURS AS NEEDED WHEEZING OR SHORTNESS OF BREATH, Disp: 120 mL, Rfl: 12   albuterol (VENTOLIN HFA) 108 (90 Base) MCG/ACT inhaler, Inhale 2 puffs into the lungs every 6 hours as needed for wheezing or shortness of breath., Disp: 8.5 g, Rfl: 1   ALPRAZolam (XANAX) 0.5 MG tablet, Take 0.5 mg by mouth at bedtime., Disp: , Rfl:    ASSESS FULL RANGE PEAK METER DEVI, as directed., Disp: , Rfl:    azelastine (ASTELIN) 0.1 % nasal spray, Place 2 sprays into both nostrils 2 (two) times daily. Use in each nostril as directed (Patient taking differently: Place 2 sprays into both nostrils 2 (two) times daily as needed for allergies or rhinitis. Use in each nostril as directed), Disp: 30 mL, Rfl: 12   bisoprolol (ZEBETA) 5 MG tablet, Take 1 tablet (5 mg total) by mouth daily., Disp: 30 tablet, Rfl: 2   Budeson-Glycopyrrol-Formoterol (BREZTRI AEROSPHERE) 160-9-4.8 MCG/ACT AERO, Inhale 2 puffs into the lungs in the morning and at bedtime., Disp: 10.7 g, Rfl: 11   cetirizine (ZYRTEC) 10 MG tablet, Take 1 tablet (10 mg total) by  mouth daily., Disp: 30 tablet, Rfl: 11   cholecalciferol (VITAMIN D) 1000 UNITS tablet, Take 3,000 Units by mouth daily., Disp: , Rfl:    dextromethorphan (DELSYM) 30 MG/5ML liquid, Take 30 mg by mouth as needed for cough., Disp: , Rfl:    EPINEPHrine (EPI-PEN) 0.3 mg/0.3 mL DEVI, Inject 0.3 mg into the muscle as needed., Disp: , Rfl:    ezetimibe-simvastatin (VYTORIN) 10-40 MG tablet, Take 1 tablet by mouth daily., Disp: 90 tablet, Rfl: 3   fluticasone (FLONASE) 50 MCG/ACT nasal spray, Place 1 spray into both nostrils daily as needed for allergies or rhinitis., Disp: , Rfl:    HYDROcodone bit-homatropine (HYCODAN) 5-1.5 MG/5ML syrup, Take 5 mLs by mouth every 6 (six) hours as needed for cough., Disp: 240 mL, Rfl: 0   ibuprofen (ADVIL) 200 MG tablet, Take 200 mg by mouth every 6 (six) hours as needed for moderate pain (pain score 4-6)., Disp: , Rfl:    ketoconazole (NIZORAL) 2 % cream, Apply 1 Application topically 2 (two) times daily as needed for irritation., Disp: , Rfl:    levothyroxine (SYNTHROID) 50 MCG tablet, Take 50 mcg by mouth every morning., Disp: , Rfl:    liothyronine (CYTOMEL) 5 MCG tablet, Take 5 mcg by mouth daily., Disp: , Rfl:    montelukast (SINGULAIR) 10 MG tablet, Take 10 mg by mouth at bedtime., Disp: , Rfl:    Multiple Vitamin (MULTIVITAMIN) tablet, Take 1 tablet by mouth daily., Disp: , Rfl:  omeprazole (PRILOSEC) 40 MG capsule, Take 1 capsule (40 mg total) by mouth 2 (two) times daily. Take 30 minutes before a meal, Disp: 60 capsule, Rfl: 3   OXYGEN, Inhale 2 L into the lungs continuous., Disp: , Rfl:    predniSONE (DELTASONE) 5 MG tablet, Daily for 3 weeks and then every other day (Patient taking differently: Take 5 mg by mouth daily with breakfast. Daily for 3 weeks and then every other day), Disp: 30 tablet, Rfl: 1   sildenafil (REVATIO) 20 MG tablet, Take 20 mg by mouth daily as needed (ED)., Disp: , Rfl:    Simethicone (SIMETHICONE ULTRA STRENGTH) 180 MG CAPS, Take 1  capsule (180 mg total) by mouth 3 (three) times daily as needed., Disp: 90 capsule, Rfl: 0   sodium chloride (OCEAN) 0.65 % SOLN nasal spray, Place 1 spray into both nostrils as needed for congestion., Disp: , Rfl:    tamsulosin (FLOMAX) 0.4 MG CAPS capsule, Take 0.4 mg by mouth daily., Disp: , Rfl:    triamcinolone cream (KENALOG) 0.1 %, Apply 1 application topically daily as needed (sun burn itch)., Disp: , Rfl:    Turmeric 400 MG CAPS, Take 1 capsule by mouth daily., Disp: , Rfl:   Past Medical History: Past Medical History:  Diagnosis Date   Asthma    GERD (gastroesophageal reflux disease)    High cholesterol    under control.    History of blood transfusion    Hypothyroidism    Interstitial lung disease (HCC)    Multiple myeloma (HCC)    Perennial allergic rhinitis    Pneumonia    Sciatic pain, right    Seasonal allergic rhinitis    Thyroid disease     Tobacco Use: Social History   Tobacco Use  Smoking Status Former   Current packs/day: 0.00   Average packs/day: 0.1 packs/day for 3.0 years (0.3 ttl pk-yrs)   Types: Cigarettes   Start date: 10/22/1979   Quit date: 10/21/1982   Years since quitting: 41.3  Smokeless Tobacco Never    Labs: Review Flowsheet       Latest Ref Rng & Units 06/15/2021 07/12/2021 09/27/2021 01/23/2024  Labs for ITP Cardiac and Pulmonary Rehab  Hemoglobin A1c 4.6 - 6.5 % - 5.7  6.2  -  PH, Arterial 7.350 - 7.450 7.445  - - -  PCO2 arterial 32.0 - 48.0 mmHg 34.0  - - -  Bicarbonate 20.0 - 28.0 mmol/L 20.0 - 28.0 mmol/L 23.0  - - 31.0  32.0   TCO2 22 - 32 mmol/L 22 - 32 mmol/L - - - 33  34   O2 Saturation % % 97.6  - - 75  72     Details       Multiple values from one day are sorted in reverse-chronological order         Capillary Blood Glucose: No results found for: "GLUCAP"   Pulmonary Assessment Scores:  Pulmonary Assessment Scores     Row Name 10/06/23 1017 02/03/24 0851       ADL UCSD   ADL Phase Entry Exit    SOB Score  total 63 72      CAT Score   CAT Score 19 28      mMRC Score   mMRC Score 4 3            UCSD: Self-administered rating of dyspnea associated with activities of daily living (ADLs) 6-point scale (0 = "not at all" to 5 = "maximal  or unable to do because of breathlessness")  Scoring Scores range from 0 to 120.  Minimally important difference is 5 units  CAT: CAT can identify the health impairment of COPD patients and is better correlated with disease progression.  CAT has a scoring range of zero to 40. The CAT score is classified into four groups of low (less than 10), medium (10 - 20), high (21-30) and very high (31-40) based on the impact level of disease on health status. A CAT score over 10 suggests significant symptoms.  A worsening CAT score could be explained by an exacerbation, poor medication adherence, poor inhaler technique, or progression of COPD or comorbid conditions.  CAT MCID is 2 points  mMRC: mMRC (Modified Medical Research Council) Dyspnea Scale is used to assess the degree of baseline functional disability in patients of respiratory disease due to dyspnea. No minimal important difference is established. A decrease in score of 1 point or greater is considered a positive change.   Pulmonary Function Assessment:  Pulmonary Function Assessment - 10/06/23 0957       Breath   Bilateral Breath Sounds Rales    Shortness of Breath Yes;Limiting activity;Panic with Shortness of Breath;Fear of Shortness of Breath             Exercise Target Goals: Exercise Program Goal: Individual exercise prescription set using results from initial 6 min walk test and THRR while considering  patient's activity barriers and safety.   Exercise Prescription Goal: Initial exercise prescription builds to 30-45 minutes a day of aerobic activity, 2-3 days per week.  Home exercise guidelines will be given to patient during program as part of exercise prescription that the participant will  acknowledge.  Activity Barriers & Risk Stratification:  Activity Barriers & Cardiac Risk Stratification - 10/06/23 0951       Activity Barriers & Cardiac Risk Stratification   Activity Barriers Deconditioning;Muscular Weakness;Shortness of Breath    Cardiac Risk Stratification Moderate             6 Minute Walk:  6 Minute Walk     Row Name 10/06/23 1043         6 Minute Walk   Phase Initial     Distance 730 feet     Distance Feet Change 730 ft     Walk Time 6 minutes     # of Rest Breaks 0     MPH 1.38     METS 1.7     RPE 12     Perceived Dyspnea  1.5     VO2 Peak 5.97     Symptoms No     Resting HR 56 bpm     Resting Oxygen Saturation  95 %     Exercise Oxygen Saturation  during 6 min walk 89 %     Max Ex. HR 74 bpm     Max Ex. BP 128/70     2 Minute Post BP 112/68       Interval HR   1 Minute HR 68     2 Minute HR 72     3 Minute HR 74     4 Minute HR 74     5 Minute HR 74     6 Minute HR 74     2 Minute Post HR 57     Interval Heart Rate? Yes       Interval Oxygen   Interval Oxygen? Yes     Baseline Oxygen Saturation % 95 %  1 Minute Oxygen Saturation % 97 %     1 Minute Liters of Oxygen 2 L     2 Minute Oxygen Saturation % 96 %     2 Minute Liters of Oxygen 2 L     3 Minute Oxygen Saturation % 91 %     3 Minute Liters of Oxygen 2 L     4 Minute Oxygen Saturation % 89 %     4 Minute Liters of Oxygen 2 L     5 Minute Oxygen Saturation % 90 %     5 Minute Liters of Oxygen 2 L     6 Minute Oxygen Saturation % 90 %     6 Minute Liters of Oxygen 2 L     2 Minute Post Oxygen Saturation % 97 %     2 Minute Post Liters of Oxygen 2 L              Oxygen Initial Assessment:  Oxygen Initial Assessment - 11/03/23 0900       Home Oxygen   Home Oxygen Device Home Concentrator;E-Tanks;Portable Concentrator    Sleep Oxygen Prescription Continuous    Liters per minute 2    Home Exercise Oxygen Prescription Continuous    Liters per minute 2     Home Resting Oxygen Prescription Continuous    Liters per minute 2    Compliance with Home Oxygen Use Yes      Program Oxygen Prescription   Program Oxygen Prescription Continuous    Liters per minute 2      Intervention   Short Term Goals To learn and exhibit compliance with exercise, home and travel O2 prescription;To learn and understand importance of monitoring SPO2 with pulse oximeter and demonstrate accurate use of the pulse oximeter.;To learn and understand importance of maintaining oxygen saturations>88%;To learn and demonstrate proper pursed lip breathing techniques or other breathing techniques. ;To learn and demonstrate proper use of respiratory medications    Long  Term Goals Exhibits compliance with exercise, home  and travel O2 prescription;Maintenance of O2 saturations>88%;Compliance with respiratory medication;Verbalizes importance of monitoring SPO2 with pulse oximeter and return demonstration;Exhibits proper breathing techniques, such as pursed lip breathing or other method taught during program session;Demonstrates proper use of MDI's             Oxygen Re-Evaluation:  Oxygen Re-Evaluation     Row Name 10/08/23 1036 12/01/23 1106 12/31/23 0903 01/26/24 0932       Program Oxygen Prescription   Program Oxygen Prescription Continuous Continuous Continuous Continuous    Liters per minute 2 2 8 10     Comments -- -- 97% on 8L 91% on 10L      Home Oxygen   Home Oxygen Device Home Concentrator;E-Tanks;Portable Concentrator Home Concentrator;E-Tanks;Portable Concentrator Home Concentrator;E-Tanks;Portable Concentrator Home Concentrator;E-Tanks;Portable Concentrator    Sleep Oxygen Prescription Continuous Continuous Continuous Continuous    Liters per minute 2 2 2 2     Home Exercise Oxygen Prescription Continuous Continuous Continuous Continuous    Liters per minute 2 2 2 2     Home Resting Oxygen Prescription Continuous Continuous Continuous Continuous    Liters per  minute 2 2 2 2     Compliance with Home Oxygen Use Yes Yes Yes Yes      Goals/Expected Outcomes   Short Term Goals To learn and exhibit compliance with exercise, home and travel O2 prescription;To learn and understand importance of monitoring SPO2 with pulse oximeter and demonstrate accurate use of the pulse oximeter.;To  learn and understand importance of maintaining oxygen saturations>88%;To learn and demonstrate proper pursed lip breathing techniques or other breathing techniques. ;To learn and demonstrate proper use of respiratory medications To learn and exhibit compliance with exercise, home and travel O2 prescription;To learn and understand importance of monitoring SPO2 with pulse oximeter and demonstrate accurate use of the pulse oximeter.;To learn and understand importance of maintaining oxygen saturations>88%;To learn and demonstrate proper pursed lip breathing techniques or other breathing techniques. ;To learn and demonstrate proper use of respiratory medications To learn and exhibit compliance with exercise, home and travel O2 prescription;To learn and understand importance of monitoring SPO2 with pulse oximeter and demonstrate accurate use of the pulse oximeter.;To learn and understand importance of maintaining oxygen saturations>88%;To learn and demonstrate proper pursed lip breathing techniques or other breathing techniques. ;To learn and demonstrate proper use of respiratory medications To learn and exhibit compliance with exercise, home and travel O2 prescription;To learn and understand importance of monitoring SPO2 with pulse oximeter and demonstrate accurate use of the pulse oximeter.;To learn and understand importance of maintaining oxygen saturations>88%;To learn and demonstrate proper pursed lip breathing techniques or other breathing techniques. ;To learn and demonstrate proper use of respiratory medications    Long  Term Goals Exhibits compliance with exercise, home  and travel O2  prescription;Maintenance of O2 saturations>88%;Compliance with respiratory medication;Verbalizes importance of monitoring SPO2 with pulse oximeter and return demonstration;Exhibits proper breathing techniques, such as pursed lip breathing or other method taught during program session;Demonstrates proper use of MDI's Exhibits compliance with exercise, home  and travel O2 prescription;Maintenance of O2 saturations>88%;Compliance with respiratory medication;Verbalizes importance of monitoring SPO2 with pulse oximeter and return demonstration;Exhibits proper breathing techniques, such as pursed lip breathing or other method taught during program session;Demonstrates proper use of MDI's Exhibits compliance with exercise, home  and travel O2 prescription;Maintenance of O2 saturations>88%;Compliance with respiratory medication;Verbalizes importance of monitoring SPO2 with pulse oximeter and return demonstration;Exhibits proper breathing techniques, such as pursed lip breathing or other method taught during program session;Demonstrates proper use of MDI's Exhibits compliance with exercise, home  and travel O2 prescription;Maintenance of O2 saturations>88%;Compliance with respiratory medication;Verbalizes importance of monitoring SPO2 with pulse oximeter and return demonstration;Exhibits proper breathing techniques, such as pursed lip breathing or other method taught during program session;Demonstrates proper use of MDI's    Goals/Expected Outcomes Compliance and understanding of oxygen saturation monitoring and breathing techniques to decrease shortness of breath. -- Compliance and understanding of oxygen saturation monitoring and breathing techniques to decrease shortness of breath. Compliance and understanding of oxygen saturation monitoring and breathing techniques to decrease shortness of breath.             Oxygen Discharge (Final Oxygen Re-Evaluation):  Oxygen Re-Evaluation - 01/26/24 0932       Program  Oxygen Prescription   Program Oxygen Prescription Continuous    Liters per minute 10    Comments 91% on 10L      Home Oxygen   Home Oxygen Device Home Concentrator;E-Tanks;Portable Concentrator    Sleep Oxygen Prescription Continuous    Liters per minute 2    Home Exercise Oxygen Prescription Continuous    Liters per minute 2    Home Resting Oxygen Prescription Continuous    Liters per minute 2    Compliance with Home Oxygen Use Yes      Goals/Expected Outcomes   Short Term Goals To learn and exhibit compliance with exercise, home and travel O2 prescription;To learn and understand importance of monitoring SPO2 with pulse oximeter  and demonstrate accurate use of the pulse oximeter.;To learn and understand importance of maintaining oxygen saturations>88%;To learn and demonstrate proper pursed lip breathing techniques or other breathing techniques. ;To learn and demonstrate proper use of respiratory medications    Long  Term Goals Exhibits compliance with exercise, home  and travel O2 prescription;Maintenance of O2 saturations>88%;Compliance with respiratory medication;Verbalizes importance of monitoring SPO2 with pulse oximeter and return demonstration;Exhibits proper breathing techniques, such as pursed lip breathing or other method taught during program session;Demonstrates proper use of MDI's    Goals/Expected Outcomes Compliance and understanding of oxygen saturation monitoring and breathing techniques to decrease shortness of breath.             Initial Exercise Prescription:  Initial Exercise Prescription - 10/06/23 1000       Date of Initial Exercise RX and Referring Provider   Date 10/06/23    Referring Provider Ramaswamy    Expected Discharge Date 01/01/23      Oxygen   Oxygen Continuous    Liters 2    Maintain Oxygen Saturation 88% or higher      Treadmill   MPH 2    Grade 1    Minutes 15      Recumbant Elliptical   Level 1    RPM 40    Watts 50    Minutes 15       Prescription Details   Frequency (times per week) 2    Duration Progress to 30 minutes of continuous aerobic without signs/symptoms of physical distress      Intensity   THRR 40-80% of Max Heartrate 61-122    Ratings of Perceived Exertion 11-13    Perceived Dyspnea 0-4      Progression   Progression Continue to progress workloads to maintain intensity without signs/symptoms of physical distress.      Resistance Training   Training Prescription Yes    Weight blue bands    Reps 10-15             Perform Capillary Blood Glucose checks as needed.  Exercise Prescription Changes:   Exercise Prescription Changes     Row Name 10/14/23 0900 10/21/23 0938 10/28/23 0900 12/09/23 1400 12/23/23 0900     Response to Exercise   Blood Pressure (Admit) 128/80 102/64 -- 136/76 124/78   Blood Pressure (Exercise) 152/70 128/68 -- 156/70 150/78   Blood Pressure (Exit) 120/66 106/68 -- 130/70 126/66   Heart Rate (Admit) 64 bpm 67 bpm -- 82 bpm 59 bpm   Heart Rate (Exercise) 80 bpm 83 bpm -- 85 bpm 72 bpm   Heart Rate (Exit) 77 bpm 80 bpm -- 92 bpm 63 bpm   Oxygen Saturation (Admit) 100 % 95 % -- 97 % 100 %   Oxygen Saturation (Exercise) 87 % 93 % -- 93 % 95 %   Oxygen Saturation (Exit) 97 % 100 % -- 97 % 98 %   Rating of Perceived Exertion (Exercise) 13 12 -- 13 11   Perceived Dyspnea (Exercise) 1 2 -- 3 1   Duration Continue with 30 min of aerobic exercise without signs/symptoms of physical distress. Continue with 30 min of aerobic exercise without signs/symptoms of physical distress. -- Continue with 30 min of aerobic exercise without signs/symptoms of physical distress. Continue with 30 min of aerobic exercise without signs/symptoms of physical distress.   Intensity THRR unchanged THRR unchanged -- THRR unchanged THRR unchanged     Progression   Progression Continue to progress workloads to maintain  intensity without signs/symptoms of physical distress. Continue to progress  workloads to maintain intensity without signs/symptoms of physical distress. -- Continue to progress workloads to maintain intensity without signs/symptoms of physical distress. Continue to progress workloads to maintain intensity without signs/symptoms of physical distress.   Average METs 1.9 -- -- -- --     Paramedic Prescription Yes Yes -- Yes Yes   Weight blue bands blue bands -- blue bands blue bands   Reps 10-15 10-15 -- 10-15 10-15   Time 10 Minutes 10 Minutes -- 10 Minutes 10 Minutes     Interval Training   Interval Training Yes -- -- -- --   Equipment Treadmill;Recumbant Elliptical -- -- -- --     Oxygen   Oxygen Continuous Continuous -- Continuous Continuous   Liters 8 3-8 -- 3-10 3-10     Treadmill   MPH 1.5 1.8 -- 1.5 1.7   Grade 0 0 -- 0 1   Minutes 15 15 -- 15 15   METs -- 2.3 -- 1.9 2.4     Recumbant Elliptical   Level 1 3 -- 3 4   RPM 40 45 -- -- --   Watts -- 51 -- -- --   Minutes 15 15 -- 15 15   METs 1.8 2 -- 2.1 2.4     Oxygen   Maintain Oxygen Saturation -- 88% or higher -- 88% or higher 88% or higher    Row Name 01/01/24 0912 01/15/24 0955 02/03/24 0900         Response to Exercise   Blood Pressure (Admit) 114/60 126/68 130/80     Blood Pressure (Exercise) -- -- 148/82     Blood Pressure (Exit) 112/70 126/64 108/70     Heart Rate (Admit) 62 bpm 68 bpm 97 bpm  used albuterol tx     Heart Rate (Exercise) 79 bpm 86 bpm 132 bpm     Heart Rate (Exit) 64 bpm 69 bpm 99 bpm     Oxygen Saturation (Admit) 100 %  6L 100 % 100 %  8L     Oxygen Saturation (Exercise) 95 %  8L 91 % 94 %  10L     Oxygen Saturation (Exit) 99 %  6L 99 % 99 %  10L     Rating of Perceived Exertion (Exercise) 12 13 12      Perceived Dyspnea (Exercise) 1 2 1      Duration Continue with 30 min of aerobic exercise without signs/symptoms of physical distress. Continue with 30 min of aerobic exercise without signs/symptoms of physical distress. Continue with 30 min of  aerobic exercise without signs/symptoms of physical distress.     Intensity THRR unchanged THRR unchanged THRR unchanged       Progression   Progression Continue to progress workloads to maintain intensity without signs/symptoms of physical distress. Continue to progress workloads to maintain intensity without signs/symptoms of physical distress. Continue to progress workloads to maintain intensity without signs/symptoms of physical distress.       Resistance Training   Training Prescription Yes Yes Yes     Weight black bands black bands black bands     Reps 10-15 10-15 10-15     Time 10 Minutes 10 Minutes 10 Minutes       Oxygen   Oxygen Continuous Continuous Continuous     Liters 6-8 10 8-15L       Treadmill   MPH 1.5 2 --  1.4 decreased to 1  Grade 1 0 0     Minutes 15 15 15      METs 2.2 2.4 1.7       Recumbant Elliptical   Level 4 4 3      RPM -- -- 48     Watts -- -- 66     Minutes 15 15 15      METs 2.7 2.1 2.7       Oxygen   Maintain Oxygen Saturation 88% or higher 88% or higher 88% or higher              Exercise Comments:   Exercise Comments     Row Name 10/14/23 0946 01/27/24 0934         Exercise Comments Pt completed first day of group exercise. He exercised on the recumbent elliptical for 15 min, level 1, METs 1.8. He then walked on the treadmill for 15 min, speed 1.5, 0 incline. Pt required increased O2 to 8L on TM. He was able to perform warm up and cool down standing, including squats. Tolerated exercise fair, admits he is tired. Discussed METs with good reception. Completed home exercise plan. Samuel [name] is currently exercising at home. He walks on his treadmill at home 2-3 days/wk for 20-30 min/day. I encouraged him to exercise most days of the week. He agreed with my recommendations. Samuel [name] also mentioned using his local gym for resistance training. I recommended doing resistance exercises after aerobic exercise. Samuel [name] is very motivated to exercise and improve  his functional capacity. I am him confident in him carrying out an exercise regimen at home.               Exercise Goals and Review:   Exercise Goals     Row Name 10/06/23 [id number]             Exercise Goals   Increase Physical Activity Yes       Intervention Provide advice, education, support and counseling about physical activity/exercise needs.;Develop an individualized exercise prescription for aerobic and resistive training based on initial evaluation findings, risk stratification, comorbidities and participant's personal goals.       Expected Outcomes Short Term: Attend rehab on a regular basis to increase amount of physical activity.;Long Term: Add in home exercise to make exercise part of routine and to increase amount of physical activity.;Long Term: Exercising regularly at least 3-5 days a week.       Increase Strength and Stamina Yes       Intervention Provide advice, education, support and counseling about physical activity/exercise needs.;Develop an individualized exercise prescription for aerobic and resistive training based on initial evaluation findings, risk stratification, comorbidities and participant's personal goals.       Expected Outcomes Short Term: Increase workloads from initial exercise prescription for resistance, speed, and METs.;Short Term: Perform resistance training exercises routinely during rehab and add in resistance training at home;Long Term: Improve cardiorespiratory fitness, muscular endurance and strength as measured by increased METs and functional capacity ( )       Able to understand and use rate of perceived exertion (RPE) scale Yes       Intervention Provide education and explanation on how to use RPE scale       Expected Outcomes Short Term: Able to use RPE daily in rehab to express subjective intensity level;Long Term:  Able to use RPE to guide intensity level when exercising independently       Able to understand and use Dyspnea scale Yes  Intervention Provide education and explanation on how to use Dyspnea scale       Expected Outcomes Short Term: Able to use Dyspnea scale daily in rehab to express subjective sense of shortness of breath during exertion;Long Term: Able to use Dyspnea scale to guide intensity level when exercising independently       Knowledge and understanding of Target Heart Rate Range (THRR) Yes       Intervention Provide education and explanation of THRR including how the numbers were predicted and where they are located for reference       Expected Outcomes Short Term: Able to state/look up THRR;Long Term: Able to use THRR to govern intensity when exercising independently;Short Term: Able to use daily as guideline for intensity in rehab       Understanding of Exercise Prescription Yes       Intervention Provide education, explanation, and written materials on patient's individual exercise prescription       Expected Outcomes Short Term: Able to explain program exercise prescription;Long Term: Able to explain home exercise prescription to exercise independently                Exercise Goals Re-Evaluation :  Exercise Goals Re-Evaluation     Row Name 10/08/23 1035 11/03/23 0857 12/01/23 1100 12/31/23 0901 01/26/24 0930     Exercise Goal Re-Evaluation   Exercise Goals Review Increase Physical Activity;Able to understand and use Dyspnea scale;Understanding of Exercise Prescription;Increase Strength and Stamina;Knowledge and understanding of Target Heart Rate Range (THRR);Able to understand and use rate of perceived exertion (RPE) scale Increase Physical Activity;Able to understand and use Dyspnea scale;Understanding of Exercise Prescription;Increase Strength and Stamina;Knowledge and understanding of Target Heart Rate Range (THRR);Able to understand and use rate of perceived exertion (RPE) scale Increase Physical Activity;Able to understand and use Dyspnea scale;Understanding of Exercise Prescription;Increase  Strength and Stamina;Knowledge and understanding of Target Heart Rate Range (THRR);Able to understand and use rate of perceived exertion (RPE) scale Increase Physical Activity;Able to understand and use Dyspnea scale;Understanding of Exercise Prescription;Increase Strength and Stamina;Knowledge and understanding of Target Heart Rate Range (THRR);Able to understand and use rate of perceived exertion (RPE) scale Increase Physical Activity;Able to understand and use Dyspnea scale;Understanding of Exercise Prescription;Increase Strength and Stamina;Knowledge and understanding of Target Heart Rate Range (THRR);Able to understand and use rate of perceived exertion (RPE) scale   Comments Samuel [name] is scheduled to begin exercise on 12/24. Will monitor and progress as able. Samuel [name] has completed 3 exercise sessions. He exercises for 15 min on the recumbent elliptical and treadmill. Jim averages 2 METs at level 3 on the recumbent elliptical and 2.3 METs at 1.8 mph on the treadmill. He performs the warmup and cooldown standing without limitations. It is too soon to notate any discernable progressions. Samuel [name] did miss class the past week due to illness. Will continue to monitor and progress as able. Samuel [name] has completed 3 exercise sessions. He exercises for 15 min on the recumbent elliptical and treadmill. Jim averages 2 METs at level 3 on the recumbent elliptical and 2.3 METs at 1.8 mph on the treadmill. He performs the warmup and cooldown standing without limitations.Samuel [name] has missed several sessions due to medical hold and personal committments. He plans to come back 2/11. Will continue to monitor and progress as able. Samuel [name] has completed 11 exercise sessions. He exercises for 15 min on the recumbent elliptical and treadmill. Jim averages 1.8 METs at level 4 on the recumbent elliptical and 1.8 METs at 1.2 mph on the  treadmill. He performs the warmup and cooldown standing without limitations. Samuel [name] has increased his level on the recumbent  elliptical, although METs have not increased. His treadmill speed has decreased. Samuel [name] is have a hard time progressing right now due to his shortness of breath. He has noticed an increase in his shortness of breath. Will continue to monitor and progress as able. Samuel [name] has completed 14 exercise sessions. He exercises for 15 min on the recumbent elliptical and treadmill. Jim averages 2.1 METs at level 4 on the recumbent elliptical and 2. at 2.0 mph on the treadmill. He performs the warmup and cooldown standing without limitations. Samuel [name] has not increased his level on the recumbent elliptical, although METs have increased. Samuel [name] has also increased his speed on the treadmill. He is becoming deconditioned as it seems his respiratory symptoms have worsened. Samuel [name] has misses several classes due to illness. Will continue to monitor and progress as able.   Expected Outcomes Through exercise at rehab and home, the patient will decrease shortness of breath with daily activities and feel confident in carrying out an exercise regimen at home. Through exercise at rehab and home, the patient will decrease shortness of breath with daily activities and feel confident in carrying out an exercise regimen at home. Through exercise at rehab and home, the patient will decrease shortness of breath with daily activities and feel confident in carrying out an exercise regimen at home. Through exercise at rehab and home, the patient will decrease shortness of breath with daily activities and feel confident in carrying out an exercise regimen at home. Through exercise at rehab and home, the patient will decrease shortness of breath with daily activities and feel confident in carrying out an exercise regimen at home.            Discharge Exercise Prescription (Final Exercise Prescription Changes):  Exercise Prescription Changes - 02/03/24 0900       Response to Exercise   Blood Pressure (Admit) 130/80    Blood Pressure (Exercise)  148/82    Blood Pressure (Exit) 108/70    Heart Rate (Admit) 97 bpm   used albuterol tx   Heart Rate (Exercise) 132 bpm    Heart Rate (Exit) 99 bpm    Oxygen Saturation (Admit) 100 %   8L   Oxygen Saturation (Exercise) 94 %   10L   Oxygen Saturation (Exit) 99 %   10L   Rating of Perceived Exertion (Exercise) 12    Perceived Dyspnea (Exercise) 1    Duration Continue with 30 min of aerobic exercise without signs/symptoms of physical distress.    Intensity THRR unchanged      Progression   Progression Continue to progress workloads to maintain intensity without signs/symptoms of physical distress.      Resistance Training   Training Prescription Yes    Weight black bands    Reps 10-15    Time 10 Minutes      Oxygen   Oxygen Continuous    Liters 8-15L      Treadmill   MPH --   1.4 decreased to 1   Grade 0    Minutes 15    METs 1.7      Recumbant Elliptical   Level 3    RPM 48    Watts 66    Minutes 15    METs 2.7      Oxygen   Maintain Oxygen Saturation 88% or higher  Nutrition:  Target Goals: Understanding of nutrition guidelines, daily intake of sodium 1500mg , cholesterol 200mg , calories 30% from fat and 7% or less from saturated fats, daily to have 5 or more servings of fruits and vegetables.  Biometrics:    Nutrition Therapy Plan and Nutrition Goals:  Nutrition Therapy & Goals - 01/29/24 0924       Nutrition Therapy   Diet General Healthy Diet      Personal Nutrition Goals   Nutrition Goal Patient to improve diet quality by using the plate method as a guide for meal planning to include lean protein/plant protein, fruits, vegetables, whole grains, nonfat dairy as part of a well-balanced diet.   Goal in progress.   Comments Goals in progress. 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 He has tried multiple dietary interventions in the past including organic food diet, turmeric protein shakes, etc.  He continues regular follow-up with oncology, pulmonology, speech therapy; he has also received multiple opinions from [facility], [facility], [place], etc. Right Heart Cath on 01/23/24 shows "borderline pulmonary hypertension". He is down ~6# since starting with our program. While pulmonology has previously recommended weight loss and even weight loss drugs, this weight loss is unintentional and likely related to increased caloric needs/disease progression, etc. Discussed strategies and gave handouts for weight maintenance, regular protein intake to maintain lean muscle mass,etc. Patient will continue to benefit from participation in pulmonary rehab for nutrition, exercise, and lifestyle modification support.      Intervention Plan   Intervention Prescribe, educate and counsel regarding individualized specific dietary modifications aiming towards targeted core components such as weight, hypertension, lipid management, diabetes, heart failure and other comorbidities.;Nutrition handout(s) given to patient.    Expected Outcomes Short Term Goal: Understand basic principles of dietary content, such as calories, fat, sodium, cholesterol and nutrients.;Long Term Goal: Adherence to prescribed nutrition plan.             Nutrition Assessments:  MEDIFICTS Score Key: >=70 Need to make dietary changes  40-70 Heart Healthy Diet <= 40 Therapeutic Level Cholesterol Diet   Picture Your Plate Scores: <16 Unhealthy dietary pattern with much room for improvement. 41-50 Dietary pattern unlikely to meet recommendations for good health and room for improvement. 51-60 More healthful dietary pattern, with some room for improvement.  >60 Healthy dietary pattern, although there may be some specific behaviors that could be improved.    Nutrition Goals Re-Evaluation:  Nutrition Goals Re-Evaluation     Row Name 12/29/23 0953 01/29/24 0924           Goals   Current Weight 205 lb 0.4 oz (93 kg)  weight  from last attended session on 12/25/22 201 lb 15.1 oz (91.6 kg)      Comment CBC WNL no new labs at this time.      Expected Outcome Goals in progress. 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 He has tried multiple dietary interventions in the past including organic food diet, turmeric protein shakes, etc. He continues regular follow-up with oncology, pulmonology, speech therapy; he has also received multiple opinions from [facility], [facility], [place], etc. He is down 2.9# since starting with our program. Patient will continue to benefit from participation in pulmonary rehab for nutrition, exercise, and lifestyle modification support. Goals in progress. 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 He has tried multiple dietary interventions in the past  including organic food diet, turmeric protein shakes, etc. He continues regular follow-up with oncology, pulmonology, speech therapy; he has also received multiple opinions from [facility], [facility], [place], etc. Right Heart Cath on 01/23/24 shows "borderline pulmonary hypertension". He is down ~6# since starting with our program. While pulmonology has previously recommended weight loss and even weight loss drugs, this weight loss is unintentional and likely related to increased caloric needs/disease progression, etc. Discussed strategies and gave handouts for weight maintenance, regular protein intake to maintain lean muscle mass,etc. Patient will continue to benefit from participation in pulmonary rehab for nutrition, exercise, and lifestyle modification support.               Nutrition Goals Discharge (Final Nutrition Goals Re-Evaluation):  Nutrition Goals Re-Evaluation - 01/29/24 0924       Goals   Current Weight 201 lb 15.1 oz (91.6 kg)    Comment no new labs at this time.    Expected Outcome Goals in progress. Samuel [name] has medical history of  drug induced ILD, CAD, HTN, hyperlipidemia, multiple myleoma. He has tried multiple dietary interventions in the past including organic food diet, turmeric protein shakes, etc. He continues regular follow-up with oncology, pulmonology, speech therapy; he has also received multiple opinions from [facility], [facility], [place], etc. Right Heart Cath on 01/23/24 shows "borderline pulmonary hypertension". He is down ~6# since starting with our program. While pulmonology has previously recommended weight loss and even weight loss drugs, this weight loss is unintentional and likely related to increased caloric needs/disease progression, etc. Discussed strategies and gave handouts for weight maintenance, regular protein intake to maintain lean muscle mass,etc. Patient will continue to benefit from participation in pulmonary rehab for nutrition, exercise, and lifestyle modification support.             Psychosocial: Target Goals: Acknowledge presence or absence of significant depression and/or stress, maximize coping skills, provide positive support system. Participant is able to verbalize types and ability to use techniques and skills needed for reducing stress and depression.  Initial Review & Psychosocial Screening:  Initial Psych Review & Screening - 10/06/23 0949       Initial Review   Current issues with Current Anxiety/Panic;Current Psychotropic Meds;Current Stress Concerns    Source of Stress Concerns Chronic Illness;Unable to participate in former interests or hobbies    Comments Pt is stressed about his health. He also admits to having days of sadness because he is not able to do activities such as hiking anymore. He has good support from his family and friends.      Family Dynamics   Good Support System? Yes      Barriers   Psychosocial barriers to participate in program The patient should benefit from training in stress management and relaxation.;Psychosocial barriers identified  (see note)      Screening Interventions   Interventions Encouraged to exercise    Expected Outcomes Long Term Goal: Stressors or current issues are controlled or eliminated.;Short Term goal: Utilizing psychosocial counselor, staff and physician to assist with identification of specific Stressors or current issues interfering with healing process. Setting desired goal for each stressor or current issue identified.             Quality of Life Scores:  Scores of 19 and below usually indicate a poorer quality of life in these areas.  A difference of  2-3 points is a clinically meaningful difference.  A difference of 2-3 points in the total score of  the Quality of Life Index has been associated with significant improvement in overall quality of life, self-image, physical symptoms, and general health in studies assessing change in quality of life.  PHQ-9: Review Flowsheet       02/03/2024 10/06/2023  Depression screen PHQ 2/9  Decreased Interest 0 1  Down, Depressed, Hopeless 1 1  PHQ - 2 Score 1 2  Altered sleeping 0 0  Tired, decreased energy 1 0  Change in appetite 0 0  Feeling bad or failure about yourself  1 1  Trouble concentrating 1 0  Moving slowly or fidgety/restless 0 1  Suicidal thoughts 0 0  PHQ-9 Score 4 4  Difficult doing work/chores Somewhat difficult Somewhat difficult   Interpretation of Total Score  Total Score Depression Severity:  1-4 = Minimal depression, 5-9 = Mild depression, 10-14 = Moderate depression, 15-19 = Moderately severe depression, 20-27 = Severe depression   Psychosocial Evaluation and Intervention:  Psychosocial Evaluation - 10/06/23 0950       Psychosocial Evaluation & Interventions   Interventions Encouraged to exercise with the program and follow exercise prescription;Stress management education    Comments Samuel [name] admits to having stress due to his current health issues. He also admits to being "a little down". some days due to not being able  to do certain activities anymore.  He is currently taking psychostropic meds. He denies needing a referral for a therapist at this time.    Expected Outcomes For Samuel [name] to have less stress throughout the program    Continue Psychosocial Services  No Follow up required             Psychosocial Re-Evaluation:  Psychosocial Re-Evaluation     Row Name 10/08/23 1212 11/10/23 0954 12/03/23 1501 12/29/23 1336 01/26/24 1139     Psychosocial Re-Evaluation   Current issues with Current Depression;Current Psychotropic Meds;Current Stress Concerns Current Depression;Current Psychotropic Meds;Current Stress Concerns Current Depression;Current Psychotropic Meds;Current Stress Concerns Current Depression;Current Psychotropic Meds;Current Stress Concerns Current Depression;Current Psychotropic Meds;Current Stress Concerns   Comments No changes since orienation. Samuel [name] is scheduled to start the program on 10/14/23. Samuel [name] has been placed on medical hold due to his worsening disease. His last class was on 10/21/23. He is currently being seen by speech therapy, oncology, and pulmonary. Samuel [name] has returned to class and is in good spirits. He is glad to be back in class. He feels his depression is stable at this time. No new psychosocial barriers or concerns at this time. Samuel [name] denies any new psychosocial barriers or concerns at this time. He feels his stress and depression are controlled at this time. Samuel [name] has recently had to undergo a RHC to assess for PH. He feels discouraged about his health. He is trying to stay hopeful, but states "it is hard". He states he gets a little glimpse of hope when he comes to class. He is compliant with taking his psychotropic meds. He denies needing a referral to a mental health specialist at this time.   Expected Outcomes For Samuel [name] to attend PR free of any psy/soc barriers or concerns. For Samuel [name] to use positive coping mechanisms to reduce his stress. For Samuel [name] to attend PR free of any psy/soc barriers or  concerns. For Samuel [name] to use positive coping mechanisms to reduce his stress. For Samuel [name] to attend PR free of any psy/soc barriers or concerns. For Samuel [name] to use positive coping mechanisms to reduce his stress. For Samuel [name] to attend PR free of any psy/soc barriers or concerns. For Samuel [name]  to use positive coping mechanisms to reduce his stress. For Samuel [name] to attend PR free of any psy/soc barriers or concerns. For Samuel [name] to use positive coping mechanisms to reduce his stress.   Interventions Encouraged to attend Pulmonary Rehabilitation for the exercise Encouraged to attend Pulmonary Rehabilitation for the exercise Encouraged to attend Pulmonary Rehabilitation for the exercise Encouraged to attend Pulmonary Rehabilitation for the exercise Encouraged to attend Pulmonary Rehabilitation for the exercise   Continue Psychosocial Services  No Follow up required Follow up required by staff Follow up required by staff No Follow up required No Follow up required            Psychosocial Discharge (Final Psychosocial Re-Evaluation):  Psychosocial Re-Evaluation - 01/26/24 1139       Psychosocial Re-Evaluation   Current issues with Current Depression;Current Psychotropic Meds;Current Stress Concerns    Comments Samuel [name] has recently had to undergo a RHC to assess for PH. He feels discouraged about his health. He is trying to stay hopeful, but states "it is hard". He states he gets a little glimpse of hope when he comes to class. He is compliant with taking his psychotropic meds. He denies needing a referral to a mental health specialist at this time.    Expected Outcomes For Samuel [name] to attend PR free of any psy/soc barriers or concerns. For Samuel [name] to use positive coping mechanisms to reduce his stress.    Interventions Encouraged to attend Pulmonary Rehabilitation for the exercise    Continue Psychosocial Services  No Follow up required             Education: Education Goals: Education classes will be provided on a weekly basis, covering  required topics. Participant will state understanding/return demonstration of topics presented.  Learning Barriers/Preferences:  Learning Barriers/Preferences - 10/06/23 0950       Learning Barriers/Preferences   Learning Barriers Sight    Learning Preferences Individual Instruction;Written Material             Education Topics: Know Your Numbers Group instruction that is supported by a PowerPoint presentation. Instructor discusses importance of knowing and understanding resting, exercise, and post-exercise oxygen saturation, heart rate, and blood pressure. Oxygen saturation, heart rate, blood pressure, rating of perceived exertion, and dyspnea are reviewed along with a normal range for these values.    Exercise for the Pulmonary Patient Group instruction that is supported by a PowerPoint presentation. Instructor discusses benefits of exercise, core components of exercise, frequency, duration, and intensity of an exercise routine, importance of utilizing pulse oximetry during exercise, safety while exercising, and options of places to exercise outside of rehab.  Flowsheet Row PULMONARY REHAB OTHER RESPIRATORY from 01/15/2024 in [facility]  Date 01/15/24  Educator EP  Instruction Review Code 1- Verbalizes Understanding       MET Level  Group instruction provided by PowerPoint, verbal discussion, and written material to support subject matter. Instructor reviews what METs are and how to increase METs.  Flowsheet Row PULMONARY REHAB OTHER RESPIRATORY from 12/16/2023 in [facility]  Date 12/16/23  Educator EP  Instruction Review Code 1- Verbalizes Understanding       Pulmonary Medications Verbally interactive group education provided by instructor with focus on inhaled medications and proper administration. Flowsheet Row PULMONARY REHAB OTHER RESPIRATORY from  01/08/2024 in [facility]  Date 01/08/24  Educator RT  Instruction Review Code 1- Chiropodist and Physiology of the Respiratory System Group instruction provided by PowerPoint, verbal discussion, and written material to support subject matter. Instructor reviews respiratory cycle and anatomical components of the respiratory system and their functions. Instructor also reviews differences in obstructive and restrictive respiratory diseases with examples of each.  Flowsheet Row PULMONARY REHAB OTHER RESPIRATORY from 12/25/2023 in [facility]  Date 12/25/23  Educator RT  Instruction Review Code 1- Verbalizes Understanding       Oxygen Safety Group instruction provided by PowerPoint, verbal discussion, and written material to support subject matter. There is an overview of "What is Oxygen" and "Why do we need it".  Instructor also reviews how to create a safe environment for oxygen use, the importance of using oxygen as prescribed, and the risks of noncompliance. There is a brief discussion on traveling with oxygen and resources the patient may utilize. Flowsheet Row PULMONARY REHAB OTHER RESPIRATORY from 01/29/2024 in [facility]  Date 01/29/24  Educator RN  Instruction Review Code 1- Verbalizes Understanding       Oxygen Use Group instruction provided by PowerPoint, verbal discussion, and written material to discuss how supplemental oxygen is prescribed and different types of oxygen supply systems. Resources for more information are provided.    Breathing Techniques Group instruction that is supported by demonstration and informational handouts. Instructor discusses the benefits of pursed lip and diaphragmatic breathing and detailed demonstration on how to perform both.     Risk Factor  Reduction Group instruction that is supported by a PowerPoint presentation. Instructor discusses the definition of a risk factor, different risk factors for pulmonary disease, and how the heart and lungs work together. Flowsheet Row PULMONARY REHAB OTHER RESPIRATORY from 12/04/2023 in [facility]  Date 12/04/23  Educator EP  Instruction Review Code 1- Verbalizes Understanding       Pulmonary Diseases Group instruction provided by PowerPoint, verbal discussion, and written material to support subject matter. Instructor gives an overview of the different type of pulmonary diseases. There is also a discussion on risk factors and symptoms as well as ways to manage the diseases. Flowsheet Row PULMONARY REHAB OTHER RESPIRATORY from 12/18/2023 in [facility]  Date 12/18/23  Educator RT  Instruction Review Code 1- Verbalizes Understanding       Stress and Energy Conservation Group instruction provided by PowerPoint, verbal discussion, and written material to support subject matter. Instructor gives an overview of stress and the impact it can have on the body. Instructor also reviews ways to reduce stress. There is also a discussion on energy conservation and ways to conserve energy throughout the day.   Warning Signs and Symptoms Group instruction provided by PowerPoint, verbal discussion, and written material to support subject matter. Instructor reviews warning signs and symptoms of stroke, heart attack, cold and flu. Instructor also reviews ways to prevent the spread of infection.   Other Education Group or individual verbal, written, or video instructions that support the educational goals of the pulmonary rehab program.    Knowledge Questionnaire Score:  Knowledge Questionnaire Score - 02/03/24 0851       Knowledge Questionnaire Score   Post Score 17/18              Core Components/Risk  Factors/Patient Goals at Admission:  Personal Goals and Risk Factors at Admission - 10/06/23 0951       Core Components/Risk Factors/Patient Goals on Admission   Improve shortness of breath with ADL's Yes    Intervention Provide education, individualized exercise plan and daily activity instruction to help decrease symptoms of SOB with activities of daily living.    Expected Outcomes Short Term: Improve cardiorespiratory fitness to achieve a reduction of symptoms when performing ADLs;Long Term: Be able to perform more ADLs without symptoms or delay the onset of symptoms    Increase knowledge of respiratory medications and ability to use respiratory devices properly  Yes    Intervention Provide education and demonstration as needed of appropriate use of medications, inhalers, and oxygen therapy.    Expected Outcomes Short Term: Achieves understanding of medications use. Understands that oxygen is a medication prescribed by physician. Demonstrates appropriate use of inhaler and oxygen therapy.;Long Term: Maintain appropriate use of medications, inhalers, and oxygen therapy.    Stress Yes    Intervention Offer individual and/or small group education and counseling on adjustment to heart disease, stress management and health-related lifestyle change. Teach and support self-help strategies.;Refer participants experiencing significant psychosocial distress to appropriate mental health specialists for further evaluation and treatment. When possible, include family members and significant others in education/counseling sessions.    Expected Outcomes Short Term: Participant demonstrates changes in health-related behavior, relaxation and other stress management skills, ability to obtain effective social support, and compliance with psychotropic medications if prescribed.;Long Term: Emotional wellbeing is indicated by absence of clinically significant psychosocial distress or social  isolation.             Core Components/Risk Factors/Patient Goals Review:   Goals and Risk Factor Review     Row Name 10/08/23 1220 11/10/23 0956 12/03/23 1504 12/29/23 1339 01/26/24 1144     Core Components/Risk Factors/Patient Goals Review   Personal Goals Review Develop more efficient breathing techniques such as purse lipped breathing and diaphragmatic breathing and practicing self-pacing with activity.;Increase knowledge of respiratory medications and ability to use respiratory devices properly.;Improve shortness of breath with ADL's;Stress Develop more efficient breathing techniques such as purse lipped breathing and diaphragmatic breathing and practicing self-pacing with activity.;Increase knowledge of respiratory medications and ability to use respiratory devices properly.;Improve shortness of breath with ADL's;Stress Improve shortness of breath with ADL's;Develop more efficient breathing techniques such as purse lipped breathing and diaphragmatic breathing and practicing self-pacing with activity.;Increase knowledge of respiratory medications and ability to use respiratory devices properly.;Stress Improve shortness of breath with ADL's;Develop more efficient breathing techniques such as purse lipped breathing and diaphragmatic breathing and practicing self-pacing with activity.;Increase knowledge of respiratory medications and ability to use respiratory devices properly.;Stress Improve shortness of breath with ADL's;Develop more efficient breathing techniques such as purse lipped breathing and diaphragmatic breathing and practicing self-pacing with activity.   Review No changes since orientation. Samuel [name] is scheduled to start the program on 10/14/23 Samuel [name] has been placed on medical hold due to his worsening disease. He attended 3 classes so far. Unable to assess goals yet. His last class was on 10/21/23. He is currently being seen by speech therapy, oncology, and pulmonary. He will continue to  benefit from PR for nutrition, education, exercise, and lifestyle modification. Samuel [name] has returned back to class and has attended on session.  Goal progressing for developing more efficient breathing techniques such as purse lipped breathing and diaphragmatic breathing; and practicing self-pacing with activity. Goal progressing for improving shortness of breath  with ADL's. Goal progressing for increase knowledge of respiratory medications and ability to use respiratory devices properly. Goal progressing for improving stress.  We will continue to monitor her progress throughout the program. Goal progressing for developing more efficient breathing techniques such as purse lipped breathing and diaphragmatic breathing; and practicing self-pacing with activity. Goal progressing for improving shortness of breath with ADL's. Samuel [name] is currently using 4-8L O2 while exercising to keep sats >88%. Goal met for increase knowledge of respiratory medications and ability to use respiratory devices properly. Samuel [name] has demonstrated proper use of his MDI to staff. Goal met for improving stress. Samuel [name] states his stress has greatly reduced since starting the program. We will continue to monitor his progress throughout the program. Monthly review of patient's Core Components/Risk Factors/Patient Goals are as follows: Goal met for developing more efficient breathing techniques such as purse lipped breathing and diaphragmatic breathing; and practicing self-pacing with activity. He is able to demonstrate purse lip breathing when he gets short of breath. He also knows how to self pace based on his rate of perceived exertion score. He has also been working on his diaphragmatic breathing at home. Goal progressing for improving shortness of breath with ADL's. Samuel [name] is currently using 8-10L O2 while exercising to keep sats >88%. We will continue to monitor his progress throughout the program.   Expected Outcomes For Samuel [name] to develop more efficient  breathing techniques such as purse lipped breathing and diaphragmatic breathing; and practicing self-pacing activities, to increase his knowledge of respiratory medications and ability to use respiratory devices properly, improve his shortness of breath with ADLs, and to reduce stress. For Samuel [name] to develop more efficient breathing techniques such as purse lipped breathing and diaphragmatic breathing; and practicing self-pacing activities, to increase his knowledge of respiratory medications and ability to use respiratory devices properly, improve his shortness of breath with ADLs, and to reduce stress. For Samuel [name] to develop more efficient breathing techniques such as purse lipped breathing and diaphragmatic breathing; and practicing self-pacing activities, to increase his knowledge of respiratory medications and ability to use respiratory devices properly, improve his shortness of breath with ADLs, and to reduce stress. For Samuel [name] to develop more efficient breathing techniques such as purse lipped breathing and diaphragmatic breathing; and practicing self-pacing activities and improve his shortness of breath with ADLs To improve shortness of breath with ADL's.            Core Components/Risk Factors/Patient Goals at Discharge (Final Review):   Goals and Risk Factor Review - 01/26/24 1144       Core Components/Risk Factors/Patient Goals Review   Personal Goals Review Improve shortness of breath with ADL's;Develop more efficient breathing techniques such as purse lipped breathing and diaphragmatic breathing and practicing self-pacing with activity.    Review Monthly review of patient's Core Components/Risk Factors/Patient Goals are as follows: Goal met for developing more efficient breathing techniques such as purse lipped breathing and diaphragmatic breathing; and practicing self-pacing with activity. He is able to demonstrate purse lip breathing when he gets short of breath. He also knows how to self pace  based on his rate of perceived exertion score. He has also been working on his diaphragmatic breathing at home. Goal progressing for improving shortness of breath with ADL's. Samuel [name] is currently using 8-10L O2 while exercising to keep sats >88%. We will continue to monitor his progress throughout the program.    Expected Outcomes To improve shortness of breath with ADL's.  ITP Comments:Pt is making expected progress toward Pulmonary Rehab goals after completing 17 session(s). Recommend continued exercise, life style modification, education, and utilization of breathing techniques to increase stamina and strength, while also decreasing shortness of breath with exertion.  Dr. Genetta Kenning is Medical Director for Pulmonary Rehab at [facility].     Comments:

## 2024-02-05 ENCOUNTER — Encounter (HOSPITAL_COMMUNITY)
Admission: RE | Admit: 2024-02-05 | Discharge: 2024-02-05 | Disposition: A | Discharge status: Home / Self Care | Payer: Medicare Other | Source: Ambulatory Visit | Attending: Internal Medicine | Admitting: Internal Medicine

## 2024-02-05 ENCOUNTER — Ambulatory Visit (INDEPENDENT_AMBULATORY_CARE_PROVIDER_SITE_OTHER): Admitting: Internal Medicine

## 2024-02-05 ENCOUNTER — Ambulatory Visit (INDEPENDENT_AMBULATORY_CARE_PROVIDER_SITE_OTHER): Payer: Medicare Other | Admitting: Otolaryngology

## 2024-02-05 ENCOUNTER — Encounter: Payer: Self-pay | Admitting: Internal Medicine

## 2024-02-05 VITALS — BP 148/82 | HR 58 | Ht 70.0 in | Wt 202.3 lb

## 2024-02-05 VITALS — BP 131/81 | HR 74

## 2024-02-05 DIAGNOSIS — R0981 Nasal congestion: Secondary | ICD-10-CM | POA: Diagnosis not present

## 2024-02-05 DIAGNOSIS — K219 Gastro-esophageal reflux disease without esophagitis: Secondary | ICD-10-CM

## 2024-02-05 DIAGNOSIS — I2723 Pulmonary hypertension due to lung diseases and hypoxia: Secondary | ICD-10-CM | POA: Diagnosis not present

## 2024-02-05 DIAGNOSIS — J849 Interstitial pulmonary disease, unspecified: Secondary | ICD-10-CM

## 2024-02-05 DIAGNOSIS — J383 Other diseases of vocal cords: Secondary | ICD-10-CM

## 2024-02-05 DIAGNOSIS — R0982 Postnasal drip: Secondary | ICD-10-CM | POA: Diagnosis not present

## 2024-02-05 DIAGNOSIS — J3089 Other allergic rhinitis: Secondary | ICD-10-CM

## 2024-02-05 DIAGNOSIS — R1310 Dysphagia, unspecified: Secondary | ICD-10-CM

## 2024-02-05 DIAGNOSIS — R0989 Other specified symptoms and signs involving the circulatory and respiratory systems: Secondary | ICD-10-CM | POA: Diagnosis not present

## 2024-02-05 DIAGNOSIS — R053 Chronic cough: Secondary | ICD-10-CM

## 2024-02-05 DIAGNOSIS — J9611 Chronic respiratory failure with hypoxia: Secondary | ICD-10-CM | POA: Diagnosis not present

## 2024-02-05 DIAGNOSIS — Z9981 Dependence on supplemental oxygen: Secondary | ICD-10-CM

## 2024-02-05 DIAGNOSIS — R0609 Other forms of dyspnea: Secondary | ICD-10-CM

## 2024-02-05 DIAGNOSIS — Z8709 Personal history of other diseases of the respiratory system: Secondary | ICD-10-CM

## 2024-02-05 DIAGNOSIS — R490 Dysphonia: Secondary | ICD-10-CM

## 2024-02-05 NOTE — Progress Notes (Signed)
 Daily Session Note  Patient Details  Name: Samuel [name] MRN: [id number] Date of Birth: 13-Feb-1956 Referring Provider:   Gattis Kass Pulmonary Rehab Walk Test from 10/06/2023 in [facility]  Referring Provider Ramaswamy       Encounter Date: 02/05/2024  Check In:  Session Check In - 02/05/24 0818       Check-In   Supervising physician immediately available to respond to emergencies [facility] MD immediately available    Physician(s) Rejeana Card, NP    Location [facility]    Staff Present Atlas Lea, MS, ACSM-CEP, Exercise Physiologist;Casey Mayme Spearman BS, ACSM-CEP, Exercise Physiologist;Mary Arlester Ladd, RN, BSN    Virtual Visit No    Medication changes reported     No    Fall or balance concerns reported    No    Tobacco Cessation No Change    Warm-up and Cool-down Performed as group-led instruction    Resistance Training Performed Yes    VAD Patient? No    PAD/SET Patient? No      Pain Assessment   Currently in Pain? No/denies    Pain Score 0-No pain    Multiple Pain Sites No             Capillary Blood Glucose: No results found for this or any previous visit (from the past 24 hours).    Social History   Tobacco Use  Smoking Status Former   Current packs/day: 0.00   Average packs/day: 0.1 packs/day for 3.0 years (0.3 ttl pk-yrs)   Types: Cigarettes   Start date: 10/22/1979   Quit date: 10/21/1982   Years since quitting: 41.3  Smokeless Tobacco Never    Goals Met:  Proper associated with RPD/PD & O2 Sat Exercise tolerated well No report of concerns or symptoms today Strength training completed today  Goals Unmet:  Not Applicable  Comments: Service time is from 0811 to 0915. Pt completed post 6 MWT.   Dr. Genetta Kenning is Medical Director for Pulmonary Rehab at [facility].

## 2024-02-05 NOTE — Patient Instructions (Addendum)
 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 Mean pressure  Plan  -start [name] NEBULIZER per protocol  - await insurance approval  ILD (interstitial lung disease) (HCC) Drug-induced pneumonitis  =- Fibrosis is worse;  - intolerance to anti fibrotics  Plan - supportive care  Chronic respiratory failure  Plan  - continue o2 as before  Myeloma    M protein < 0.8 in July 2024 and 0.5 in March 2025 and 0.6 in April 2025  Plan  - per [facility] and Dr Rosaline Coma  Anxiety  - affecting quality of life but glad you are now talking about it and addressing it  Plan  - meditation + talk t PCP  Chronic Cough Cough and congestion/ASthma/SEasonal allergies =  - RAST panel positive for mild IgE elevation and dust mite allerg  -Continue Hycodan cough syrup 5 mg 4 times daily as needed  Folowup May 2025 previiusly scheduled

## 2024-02-05 NOTE — Progress Notes (Signed)
 ENT Progress Note:   Update 02/05/2024  Discussed the use of AI scribe software for clinical note transcription with the patient, who gave verbal consent to proceed.  History of Present Illness   Samuel [name] "Samuel [name]" is a 68 year old male with interstitial lung disease, on supplemental O2, who presents with persistent throat clearing and phlegm sensation.  He experiences persistent throat clearing and a sensation of phlegm that he is unable to clear.  He continues to experience this sensation despite using a reflux raft and taking omeprazole, which he feels has helped somewhat. Found speech therapy helpful and voice is overall better.    His cough has decreased, but he still feels that air does not flow freely through nares describing a sensation of not being able to take breaths deep enough. He uses nasal sprays and antihistamines, including Flonase and Claritin or Zyrtec, and performs nasal rinses (with steroids sometimes), which have reduced postnasal drainage but not completely resolved it. He has interstitial lung disease, which has worsened his symptoms, on O2 supplementation 24/7 with a CT chest being stable compared to the last scant.   His voice is stronger after voice therapy but varies, and he speaks with effort. He correlates this with his lung function.  Records Reviewed:  Speech Therapy Notes 11/03/23 11/03/23: Entered with portable oxygen (pulse; 3 L). Wife, Samuel [name], present this session. Desaturated to 87% following short walk to SLP office. Steadily rose to 91% and 98% given rest break. Voice c/b hoarse, strained quality with dysfluent rate associated with impaired breath support. Did mildly improve throughout session; however, limited maintenance noted d/t impaired breath support. Continues to complain of "throat congestion" being main barrier to improved voicing. Dicussed previously recommended vocal hygiene practices, including reduction of throat clearing, increased hydration to  thin mucous, and strict adherence to reflux recommendations. Inconsistent but increasing carryover of recommendations reported over last week, with benefit. 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 Scheduled for MBSS in two days per patient report of dysphagia to ENT. Will review documentation and recommendations and modify POC accordingly. May benefit from FEES d/t pt complaint of pharyngeal congestion. Recert completed d/t decline in speech and recent concern for dysphagia.    10-08-23: Overt SOB and hoarseness exhibited this session, which was notably worse than last session. Recommended f/u with pulmonologist d/t persistent difficulty breathing impacting voice and overall functioning. Recently saw ENT and pulmonary rehab. Re-educated ENT results and recommendations, including being increasingly intentional with reducing throat clearing and taking reflux raft for reflux management. Suggested gargling with water after inhaler use d/t SLP research re: dysphonia and inhalers. Plan to initiate new interventions, circumlaryngeal message and PhoRTE, next session pending pt respiratory baseline to address newly diagnosed vocal fold atrophy and supraglottic compression.    Initial Evaluation  Reason for Consult: dysphonia chronic cough and throat clearing    HPI: Discussed the use of AI scribe software for clinical note transcription with the patient, who gave verbal consent to proceed.  History of Present Illness   The patient is 68 yoM  with hx of interstitial lung disease, presents with chronic cough and sensation of mucus in the throat, which have been ongoing for several years and have worsened with the progression of their lung condition. The patient reports a history of multiple myeloma treatment, which led to the diagnosis of interstitial lung disease in September 2022 which  was thought to be caused by medication side effect. The patient denies any history of stroke or heart attack.  The patient uses oxygen intermittently, during exercise, at night during sleep, and occasionally while watching TV. They recently experienced an exacerbation of their lung disease, which was managed with steroids. The patient also reports a history of environmental allergies and was on allergy shots for approximately ten years, which they felt were beneficial. However, they discontinued the shots due to their overall health condition.  The patient has been managing their nasal drainage with saline rinses daily, azelastine, and Flonase, usually once in the morning. They also take Claritin almost daily. For reflux, they take omeprazole. They take mucinex as needed, and recently started Reflux Raft about a month ago. The patient also occasionally uses Delsym for cough.  The patient reports occasional difficulty swallowing, but denies any specific food causing more problems. They also report occasional coughing or choking with liquids. The patient has noticed voice changes, which seem to worsen towards the end of the day. They have been seeing a speech therapist for these issues and have one session remaining.   The patient has been experiencing these symptoms for several years, but they have been exacerbated by their lung condition.   Records Reviewed:  Video Visit with Pulm 09/26/23  68 year old male former smoker followed for interstitial lung disease, drug-induced pneumonitis (multiple myeloma drug treatment) and chronic respiratory failure and asthma Medical history significant for multiple myeloma diagnosed in 2022 followed by oncology   Today's video visit is a 2 week  follow-up for interstitial lung disease, drug-induced pneumonitis, chronic respiratory failure and asthma.  Patient was seen last visit 2 weeks ago with acute symptoms of cough, congestion and shortness of breath.  Was  treated for possible asthmatic bronchitic exacerbation with a Z-Pak and a prednisone taper.  Lab work showed normal BNP.   Flu and COVID was negative.  White blood cell count was normal D-dimer was minimally elevated.  CT chest was negative for PE.  Showed chronic interstitial lung disease with mildly increased groundglass opacities, unchanged lesions in the left sixth rib and the left inferior scapula.  Since last visit patient is feeling much improved cough and congestion have decreased.  Shortness of breath has improved.  Patient is trying to get back to being more active is currently walking on the treadmill.  Has also been doing some breathing exercises.  Has been working with speech therapy.  We discussed his test results in detail.  Baseline get winded with activity.  We discussed pulmonary rehab, very interested in this.  Has a few days of prednisone left. Remains on Breztri Twice daily. Remains on O2 2l/m with activity and At bedtime    ILD /Hx of Drug induced Pneumonitis -recent flare-finish steroids. Refer to pulmonary rehab. Change HRCT for 3 months.    Asthma-recent flare now improving -finish steroids    O2 RF -continue on O2 2l/m with activity and At bedtime  SLP notes (multiple)  Reports overall improvement of voice quality and reduction of chronic cough/throat clearing behaviors. Endorses positive changes since beginning IMST exercises.    Objective Measurements: Pt exhibits occasional throat clearing, but successfully demonstrates cough suppression techniques and abdominal breathing pattern. Achieves clear and strong voicing in unstructured conversation, however is limited d/t poor respiratory support. He has increased MIP significantly over 2 weeks of completing IMST HEP.  Samuel [name] is a 68 y.o. male who presents as a return patient, for follow-up of sinusitis  and globus sensation. At time of last visit on 03/14/2023, nasal culture was obtained following thick mucoid drainage  noted in the right nasal cavity. This was abnormal, and demonstrated gram-positive bacilli. Patient was subsequently treated with a 2-week course of oral antibiotics, and presents today for posttreatment imaging. He has continued to use proton pump inhibitor twice daily, but continues to experience frequent throat clearing and globus sensation. He states he has been working on dietary and lifestyle modifications as previously instructed, and has been partially successful. He still has difficulty with frequent throat clearing and needing to cough often. He also has not been able to obtain humidification for his oxygen, as the DME company stated they needed a prescription.    Last visit 03/14/2023: Samuel [name] is a 68 y.o. male who presents as a new consult, referred by [name], FNP , for evaluation and treatment of sinus issues, hoarseness, and throat symptoms. He is accompanied by his wife.  Patient had history of multiple myeloma, status post treatment with chemotherapy. He developed interstitial lung disease following chemotherapeutic treatment, and is currently followed closely by pulmonology. He also endorses history of significant environmental allergies, and reports that recent allergy testing showed positivity to "everything". He was previously on immunotherapy, but this was held during his cancer treatment. He is considering starting back on the chemotherapy due to the significance of his symptoms. He is currently using Singulair, antihistamine and Patanase nasal spray on a daily basis. He also reports using nasal saline irrigations on a daily basis. He reports history of deviated nasal septum repair in 1983. He denies history of sinusitis requiring treatment with oral antibiotics in the last 12 months. His primary symptoms are nasal congestion and postnasal drainage. He denies facial pressure, pain, hyposmia, hypogeusia. He has not had any imaging of his head or sinuses. He does  occasionally experience headaches. He uses oxygen on a nightly basis, and states that his oxygen is currently not humidified.  He reports symptoms of silent reflux, and states he is currently taking omeprazole on a daily basis. Samuel [name] is frequent throat clearing and globus sensation. He also reports occasional hoarseness."   A/P 09-24-23: Visited ED on 09/12/23 d/t shortness of breath. Began course of antibiotics for suspected bronchitis/pneumonia and symptoms have resolved. Despite recent respiratory challenges, pt reports wanting to continue IMST exercises as he has been completing daily HEP and feels it is significantly improving respiratory strength. Additionally, a friend provided positive affirmation of voice strength and quality over 2 hour conversation. In previous session, SLP set device to 26.25 cm H20 based on MIP calculation and instructed pt not to adjust it, however the device was set to 33 cm H20 when pt entered. Reassessed MIP today across 3 trials: 80, 58, 75. Adjusted IMST device to 75% of MIP (80): 60 cm H20 (SLP set to max setting: 41 cm H20). Pt demo'd 25 reps with rare min A. Reviewed reflux precautions as pt endorses increased globus sensation/congestion in throat. Pt would like f/u with ENT for second opinion; requested today. Recommended inquiring about pulmonary rehab at upcoming appointment d/t success with IMST thus far. Re-assess MIP next session.    09-08-23: Continues to c/o significant asthma impacting respiratory function and subsequently voice. Initiated education and instruction of Administrator, arts (IMST) to address impaired breath support impacting vocal intensity and clarity. Measured MIP across 3 trials: 35, 29, 17. Modified provided IMST device to 75% of MIP (35): 26.25 cm H2O. Instructed patient on how  to use device and perform recommended exercises (see pt instructions for HEP). Pt able to demo 25 reps with occasional min A. Current device setting was  deemed appropriate. Re-assess MIP next session.    08-26-23: Reports feeling under the weather during the past week and experiencing congestion from asthma today. SLP led discussion about preparing for work meetings occurring after ST. Recommended taking 5 to 10 minutes before meetings to reset breathing and prepare for prolonged speech. Pt identified x2 strategies to focus on during meeting with mod I. In 30 minute unstructured conversation, pt experienced occasional wet vocal quality, likely due to congestion, but independently self-corrected. Otherwise maintained strong breath support and clear vocal quality with rare min A and x3 cough/throat clears. Pt endorses overall improvements in conversational speech, but wants to continue targeting respiratory support.    08-12-23: Reports ongoing improvements in voice as well as breathing with less frequent cues provided by wife to use strategies. Continue to target use of diaphragmatic breathing, intentional upper body relaxation, and forward projection of voice in dual tasking scenarios. Pt aware of increased tension and reduced abdominal breathing during transition periods that include dividing attention between talking and other task (walking, answering phone). Focused on being intentional with relaxation and breath support to improve vocal quality and intensity during targeted dual tasking activities. Occasional min cues required to reduce tension, optimize breathing pattern, and project voice during these tasks.    08-06-23: Entered with hoarse vocal quality that improved independently, with pt stating that he focused on using forward resonance. Maintained relaxed appearance, strong breath support, and clear vocal quality during 20-minute unstructured conversation. SLP directed pt to walk down the hall and return to therapy room to read a short passage x2 to target increased breath support and clear vocal quality in speech following exertion. Successfully  completed task with occasional fading to rare min A, with improved breath support and vocal quality noted in the 2nd attempt. Benefited from cues to reset before beginning to read and take intentional pauses between sentences. Pt self-rated first attempt a 7.5 and second attempt an 8.5-9 out of 10. Addressed breath support during exertion by completing walking and talking task. Pt maintained structured conversation while completing laps around therapy gym with occasional mod fading to min A to take breaths in between sentences and stay relaxed. Increased success noted in additional trial.    07-29-23: Reported ongoing HEP completion. Endorsed overall improvement of SOVT exercises, although some days are better than others. Targeted SOVTE to reduce vocal tension and improve clarity. Completed sustained exhale, hum, and glides into water with occasional min A to reduce upper body tension and take reset breaths. Introduced straw talking exercise with short phrases to improve forward resonance. Used negative practice to alternate between back-focused voice and forward resonance to improve awareness of vocal quality. Accurately read aloud a list of 8-9 word sentences using forward resonance with occasional min A. Back-focused voice more apparent at the end of sentences. Benefited from cues to break up long sentences with a pause and reset with a straw or hum.   07-23-23: Entered with clear vocal quality and reported completing SOVT HEP intermittently. Targeted SOVT exercises to reduce tension in vocal folds and improve vocal quality. Aware he can sustain exhale longer than before during exercises (averaged 6 sec for breath only). Completed hum, accents, and happy birthday into water given occasional fading to rare min A to relax upper body tension and increase breath support. Maintained 15 minute conversation with  clear vocal quality and minimal upper body tension independently. Pt endorsed increasing awareness of body  tension in the community; however, his wife occasionally cues him to relax which causes frustration. SLP counseled pt that the strategies he is learning will take time to master in the community. No coughing or throat clearing observed throughout session.  Past Medical History:  Diagnosis Date   Asthma    GERD (gastroesophageal reflux disease)    High cholesterol    under control.    History of blood transfusion    Hypothyroidism    Interstitial lung disease (HCC)    Multiple myeloma (HCC)    Perennial allergic rhinitis    Pneumonia    Sciatic pain, right    Seasonal allergic rhinitis    Thyroid disease     Past Surgical History:  Procedure Laterality Date   BRONCHIAL BIOPSY  06/18/2021   Procedure: BRONCHIAL BIOPSIES;  Surgeon: Denson Flake, MD;  Location: [facility];  Service: Cardiopulmonary;;   BRONCHIAL BIOPSY  08/28/2021   Procedure: BRONCHIAL BIOPSIES;  Surgeon: Prudy Brownie, DO;  Location: [facility];  Service: Cardiopulmonary;;   BRONCHIAL WASHINGS  06/18/2021   Procedure: BRONCHIAL WASHINGS;  Surgeon: Denson Flake, MD;  Location: [facility];  Service: Cardiopulmonary;;   BRONCHIAL WASHINGS  08/28/2021   Procedure: BRONCHIAL WASHINGS;  Surgeon: Prudy Brownie, DO;  Location: [facility];  Service: Cardiopulmonary;;   NASAL SINUS SURGERY     NECK SURGERY  1996   RIGHT HEART CATH N/A 01/23/2024   Procedure: RIGHT HEART CATH;  Surgeon: Cody Das, MD;  Location: [facility];  Service: Cardiovascular;  Laterality: N/A;   VIDEO BRONCHOSCOPY N/A 06/18/2021   Procedure: VIDEO BRONCHOSCOPY WITH FLUORO;  Surgeon: Denson Flake, MD;  Location: [facility];  Service: Cardiopulmonary;  Laterality: N/A;   VIDEO BRONCHOSCOPY N/A 08/28/2021   Procedure: VIDEO BRONCHOSCOPY WITH FLUORO;  Surgeon: Prudy Brownie, DO;  Location: [facility];  Service: Cardiopulmonary;  Laterality: N/A;    Family History  Problem Relation Age of Onset   Hypertension  Mother    Allergies Mother    Allergies Father    CAD Father 57   Breast cancer Paternal Grandmother    Hypertension Other    Heart disease Other     Social History:  reports that he quit smoking about 41 years ago. His smoking use included cigarettes. He started smoking about 44 years ago. He has a 0.3 pack-year smoking history. He has never used smokeless tobacco. He reports that he does not currently use alcohol. He reports that he does not use drugs.  Allergies:  Allergies  Allergen Reactions   Clarithromycin Other (See Comments)    Hiccups   Ofev [Nintedanib] Other (See Comments)    GI bleeding   Penicillins Other (See Comments)    Child hood unsure    Medications: I have reviewed the patient's current medications.  The PMH, PSH, Medications, Allergies, and SH were reviewed and updated.  ROS: Constitutional: Negative for fever, weight loss and weight gain. Cardiovascular: Negative for chest pain and dyspnea on exertion. Respiratory: Is not experiencing shortness of breath at rest. Gastrointestinal: Negative for nausea and vomiting. Neurological: Negative for headaches. Psychiatric: The patient is not nervous/anxious  Blood pressure 131/81, pulse 74, SpO2 91%.  PHYSICAL EXAM:  Exam: General: Well-developed, well-nourished on O2 supplementation, pauses during long sentences  Communication and Voice:slightly raspy Respiratory Respiratory effort: Equal inspiration and expiration without stridor Cardiovascular Peripheral Vascular: Warm  extremities with equal color/perfusion Eyes: No nystagmus with equal extraocular motion bilaterally Neuro/Psych/Balance: Patient oriented to person, place, and time; Appropriate mood and affect; Gait is intact with no imbalance; Cranial nerves I-XII are intact Head and Face Inspection: Normocephalic and atraumatic without mass or lesion Facial Strength: Facial motility symmetric and full bilaterally ENT Pinna: External ear intact and  fully developed External canal: Canal is patent with intact skin Tympanic Membrane: Clear and mobile External Nose: No scar or anatomic deformity Internal Nose: Septum is relatively straight. No polyp, or purulence. Mucosal edema and erythema present. Clear thick mucus in nasal passages.  Bilateral inferior turbinate hypertrophy.  Lips, Teeth, and gums: Mucosa and teeth intact and viable TMJ: No pain to palpation with full mobility Oral cavity/oropharynx: No erythema or exudate, no lesions present Nasopharynx: No mass or lesion with intact mucosa Hypopharynx: Intact mucosa without pooling of secretions Larynx Glottic: Full true vocal cord mobility without lesion or mass, VF atrophy b/l and supraglottic compression Supraglottic: Normal appearing epiglottis and AE folds Interarytenoid Space: Moderate pachydermia&edema Subglottic Space: Patent without lesion or edema Neck Neck and Trachea: Midline trachea without mass or lesion Thyroid: No mass or nodularity Lymphatics: No lymphadenopathy  Procedure:  Preoperative diagnosis: chronic cough and phlegm sensation   Postoperative diagnosis:   Same + GERD LPR  Procedure: Flexible fiberoptic laryngoscopy  Surgeon: Kenwood Rosiak, MD  Anesthesia: Topical lidocaine and Afrin Complications: None Condition is stable throughout exam  Indications and consent:  The patient presents to the clinic with above symptoms. Indirect laryngoscopy view was incomplete. Thus it was recommended that they undergo a flexible fiberoptic laryngoscopy. All of the risks, benefits, and potential complications were reviewed with the patient preoperatively and verbal informed consent was obtained.  Procedure: The patient was seated upright in the clinic. Topical lidocaine and Afrin were applied to the nasal cavity. After adequate anesthesia had occurred, I then proceeded to pass the flexible telescope into the nasal cavity. The nasal cavity was patent without  rhinorrhea or polyp. The nasopharynx was also patent without mass or lesion. The base of tongue was visualized and was normal. There were no signs of pooling of secretions in the piriform sinuses. The true vocal folds were mobile bilaterally. There were no signs of glottic or supraglottic mucosal lesion or mass. There was moderate interarytenoid pachydermia and post cricoid edema. The telescope was then slowly withdrawn and the patient tolerated the procedure throughout.    PROCEDURE NOTE: nasal endoscopy  Preoperative diagnosis: chronic nasal congestion and nasal obstruction symptoms  Postoperative diagnosis: same  Procedure: Diagnostic nasal endoscopy (16109)  Surgeon: Artice Last, M.D.  Anesthesia: Topical lidocaine and Afrin  H&P REVIEW: The patient's history and physical were reviewed today prior to procedure. All medications were reviewed and updated as well. Complications: None Condition is stable throughout exam Indications and consent: The patient presents with symptoms of chronic sinusitis not responding to previous therapies. All the risks, benefits, and potential complications were reviewed with the patient preoperatively and informed consent was obtained. The time out was completed with confirmation of the correct procedure.   Procedure: The patient was seated upright in the clinic. Topical lidocaine and Afrin were applied to the nasal cavity. After adequate anesthesia had occurred, the rigid nasal endoscope was passed into the nasal cavity. The nasal mucosa, turbinates, septum, and sinus drainage pathways were visualized bilaterally. This revealed no purulence or significant secretions that might be cultured. There were no polyps or sites of significant inflammation. The mucosa was intact  and there was no crusting present. The scope was then slowly withdrawn and the patient tolerated the procedure well. There were no complications or blood loss.    Studies Reviewed: CT  chest 09/12/23 IMPRESSION: 1. No pulmonary embolus. 2. Lower lung volumes from prior CT. Chronic interstitial lung disease. Mild diffuse ground-glass opacity, particularly in the lung bases, increased from prior. This may represent superimposed infection or edema. 3. Small presumed myelomatous lesions in the posterior left sixth rib and left inferior scapula. These are unchanged from prior. 4. Coronary artery calcifications.  MBS/esophagram 11/05/23 CLINICAL DATA:  Patient with a history of multiple myeloma with interstitial lung disease presents today with coughing during meals, raspy voice and globus sensation  Esophagram: IMPRESSION: Mild dysmotility with intermittent tertiary contractions. Otherwise normal study.  MBS notes  Pt is a 68 yo male referred for OP MBS by ENT. 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 There was also pooling of secretions in his pyriform sinuses (L>R). He has been working with OP SLP for dysphonia, frequent throat clearing. He feels like he is getting improvement using his IMST specifically. PMH includes: ILD (intermittent use of oxygen, has been using more consistently lately), globus sensation, chronic cough, GERD, multiple myeloma, former smoker, neck surgery, allergies     Pt's oropharyngeal swallow is functional with adequate efficiency and safety. He has trace amounts of residue in his pyriform sinuses after the swallow, noted more with liquids than with solids. He does not have a lot of anterior hyoid excursion but he has no other overt pharyngeal deficits. No penetration or aspiration observed. Note that pt did report feeling like the purees and solids got stuck in his throat, which elicited coughing and throat clearing in an attempt to clear. When correlated with imaging, there was no residual in his pharynx. Question a referrred sensation and primary esophageal component (pt having esophagram  after this study). Would continue regular solids and thin liquids with use of esophageal precautions. Would defer f/u to primary OP SLP.   Assessment/Plan: Encounter Diagnoses  Name Primary?   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 Symptoms exacerbated by interstitial lung disease and nasal cannula O2 requirement. Examination revealed pooling of secretions in pyriform sinuses concerning for dysphagia. 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  Discussed voice therapy as first-line therapy. Reviewed potential benefits and risks of current medications and new treatments such as steroid nasal rinses and switching antihistamines. - Order swallow study to evaluate for dysphagia (MBS esophagram) - Refer to speech therapy for voice therapy for vocal fold atrophy glottic insufficiency - Continue Azelastine for chronic nasal congestion - Switch from Claritin to Zyrtec - Add budesonide or mometasone to nasal rinses - Use Vaseline to prevent nasal dryness - Continue omeprazole and reflux raft, especially at night - Continue Delsym and Mucinex as needed for cough  Chronic Nasal Congestion Post-Nasal Drip Post-nasal drip contributing to chronic cough  and throat clearing. Examination showed clear drainage in the back of the nose and nasal irritation likely due to allergies and O2 supplementation via nasal cannula. Discussed benefits of saline nasal rinses and potential for steroid nasal rinses to cover a larger surface  area. Informed about slight risk of epistaxis with oxygen therapy and importance of using Vaseline to prevent nasal dryness. - Continue saline nasal rinses - Add budesonide or mometasone to nasal rinses - Switch from Claritin to Zyrtec - Continue azelastine 2 puffs b/l nares twice daily - Use Vaseline to prevent nasal dryness  Chronic Gastroesophageal Reflux Disease (GERD) GERD contributing to chronic throat clearing and cough.  Discussed importance of taking omeprazole and reflux raft, especially at night, and potential benefits of using reflux raft after large meals during the day. - Continue omeprazole 40 mg daily - Continue reflux raft, especially at night - Consider using reflux raft after large meals during the day  Chronic dysphonia and Vocal Fold Atrophy/glottic insufficiency on strobe exam Vocal fold atrophy likely contributing to voice changes. Examination revealed age-appropriate thinning of the vocal folds but vocal folds were mobile without masses or lesions.  - Refer to speech therapy for voice therapy   Follow-up - Schedule follow-up after swallow study and voice therapy - Schedule MBS esophagram  Last OV Assessment and Plan    Chronic Throat clearing  and Phlegm Sensation/Globus, chronic cough  Persistent sensation of phlegm in the throat, difficulty expelling secretions, and intermittent voice changes. Some improvement with speech therapy and reflux management. Examination shows clear secretions in both pyriforms, VF atrophy and supraglottic compression, and mild findings c/w GERD LPR. Differential includes postnasal drainage and reflux changes triggering issues with phlegm and weaker cough strength 2/2 hx of poor lung function. Discussed FEES exam to evaluate swallowing function and secretions (his MBS showed normal oropharyngeal swallow and esophagram showed mild dysmotility but no other findings). Explained that FEES involves feeding colored substances while a scope is  in the nose to observe if secretions get caught anywhere. FEES could help determine if the sensation of things getting caught is real or perceived. Treatments for weak cough due to lung disease include injection augmentation, but these may not be ideal due to his poor lung function. Emphasized management of postnasal drainage and reflux. - Refer for FEES exam - Continue reflux management with Reflux Raft after each meal and Prilosec - Continue Flonase and antihistamine (Claritin or Zyrtec) - Continue nasal saline rinses (ok to hold off on adding steroids and do rinses with salt alone) - Follow up in April after lung CT/Pulm visit and FEES results  Dysphagia, coughing with liquids  Mild esophageal dysmotility noted on esophagram, with normal swallow function on MBS. Symptoms may contribute to sensation of food or secretions getting caught in the throat. - Monitor symptoms - Continue current management strategies for GER - FEES  Interstitial Lung Disease Ongoing management by pulmonologist. Recent exacerbations and history of bronchitis. Lung function impacts ability to cough/expel secretions effectively. Awaiting results of lung CT scan in March. Weak cough due to lung disease affects ability to expel secretions. - Review lung CT results in April follow-up - Coordinate care with pulmonologist Dr. Aubery Blare  General Health Maintenance Emphasized importance of continuing current medications and therapies to manage symptoms. - Continue Reflux Raft after each meal - Continue Flonase and antihistamine (Claritin or Zyrtec) - Continue nasal saline rinses  Dysphonia, VF atrophy/glottic insufficiency on videostrobe today  - continue speech therapy  - will consider  VF injection augmentation if warranted in the future   Follow-up - Schedule follow-up appointment in April   Update 02/05/2024 Assessment and Plan Assessment & Plan Chronic Cough Chronic cough possibly related to lung disease but  could also  be due to postnasal drainage, GERD LPR . Normal swallow study excludes aspiration. Reflux raft beneficial for reflux management. Overall he feels the cough is better. His main issue now is trouble with taking deep breaths and nasal obstruction - Continue Zyrtec, Claritin, azelastine, and Flonase. - Continue reflux raft. - Encourage nasal saline rinses. - Consider steroid nasal rinses three times weekly and nasal saline rinses daily - Consider trying Navage  Chronic Nasal Congestion Nasal obstruction Nasal congestion likely due to postnasal drainage from allergies, vasomotor rhinitis, or nasal cannula oxygen use. Slight septal deviation and turbinate hypertrophy noted, but his sx are most likely related to ongoing chronic nasal inflammation. Surgical intervention not recommended 2/2 lung disease and risk of anesthesia, and a need to control allergies and post-nasal drainage even if surgery is performed. We discussed that septo/ITR will not provide complete sx relief. We also discussed that lung disease could be playing a role in the sensation of having trouble taking deep enough breaths - Encourage regular nasal saline rinses. - Consider steroid nasal rinses three times weekly.   GERD LPR Reflux raft improved symptoms, possibly contributing to cough and throat clearing. Contains melatonin for nighttime reflux control. - Continue reflux raft. - diet and lifestyle changes to minimize reflux  - continue Prilosec  Pulmonary Fibrosis Pulmonary fibrosis stable on CT from November to March. Worsening breathing and increased oxygen dependency noted. Managed by pulmonary physician. - Continue pulmonary rehabilitation and follow-up with pulmonary physician.  Environmental Allergies Multiple allergies with previous benefit from allergy shots. Recent attempt to restart shots coincided with worsening symptoms of lung disease - Discuss with allergist the possibility of resuming allergy shots  considering lung disease.   I spent 30 minutes in total face-to-face time and in reviewing records during pre-charting, more than 50% of which was spent in counseling and coordination of care, reviewing test results, reviewing medications and treatment regimen and/or in discussing or reviewing the diagnosis, the prognosis and treatment options. Pertinent laboratory and imaging test results that were available during this visit with the patient were reviewed by me and considered in my medical decision making (see chart for details).     Artice Last, MD Otolaryngology [facility] Phone: [phone] Fax: [phone]    02/05/2024, 6:23 PM

## 2024-02-05 NOTE — Progress Notes (Signed)
 IOV 10/04/2011  68 year old male. Allergies and possible asthma. Hypothyroidism. Anxiety state but no formal diagnosis. 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 Reports dyspnea. INsidious onset "all my life". Increased after starting allergy shots in June 2012. Says during hikes after he gets going it gets better. Same in gym. Notices more when he is starting out or stationary at rest. HE is not sure what it is.  Feels he needs to take a deep breath on occasion. So, now referred to cardiology and pulmonary. Says ICS Qvar in august/sept 2012 made it worse. Outside chart mentions asthma but patient says not sure if he has asthma. But reports childhood hx of asthma diagnosed by a pediatrician at age 42. Says he had similar symptoms.  Up until 1990 used to 10K but even back then had similar symptoms and would need to warm up before feeling better. Then stopped running due to neck problems. Similar during swimming exercise in 1996-1997. Quit swimming 1999. Episodes associated with wheezing but no cough. Unclear if albuterol helps but on xopenex prn esp before walking. This whole thing feels like he is not getting "enough juice". Never had PFT but recollects normal spirometry at Dr Lyla Son office.  He has seen Dr Shon Baton cardiology for above - treadmill and echo and carotid doppler all pending. Current pulmonary modulating drug is Singulair only  Chest x-ray 07/16/2011 is clear per my personal review.  xxxxxxxxxxxxxxxxxxxxxxxxxxxxxxxxxxxxxxxxxxxxxxxxxxxxxxxxxxxxxxxxxx  Inpatient consult 06/16/21 68 y/o male with a history of multiple myeloma diagnosed in 2022, currently undergoing treatment presented to the [facility] emergency department from his hematology oncology appointment with a chief complaint of dyspnea.  The patient smoked cigarettes briefly as a young adult but since then has lived a relatively  healthy lifestyle staying active with exercise and playing golf.  He was diagnosed with multiple myeloma in the spring 2022 when a bruise was discovered while he was receiving a sports massage.  He went for further work-up and was found to have abnormal lab work, this led to a bone marrow biopsy confirming diagnosis of multiple myeloma.  He has been followed by Dr. Leonides Schanz who has been treating him with VRd chemotherapy since Mar 16, 2021.  He is currently in the middle of his fifth cycle.     Over the last several weeks he has been developing progressive shortness of breath with exertion.  He says this has been going on for about 3 to 4 weeks and has significantly worsened in the last week.  This is associated with a dry, rare cough.  He denies fevers, chills, leg swelling, weight gain, or mucus production.  In the last several days (just prior to admission) [place] the [place] and while there felt significant shortness of breath so he presented to Dr. Derek Mound office for further evaluation.  He was sent to the emergency room for evaluation further because of the severity of his shortness of breath.  In the emergency department he was noted to have hypoxemia to an O2 saturation of 87% and an abnormal chest exam.  Pulmonary and critical care medicine was consulted for further evaluation.  He says that throughout his adult life he has had what he calls "huffing" when he talks while exerting himself or with exertion.  He says he has been followed by an allergist and has been treated for asthma at  times over the years.  He does not regularly use an inhaler and he says this is not something that he routinely needs.  He was seen by my partner Dr. Marchelle Gearing at 1 point in 2013 during which time he had a normal chest x-ray and normal lung function testing.   He had COVID in April 2022.   June 15, 2021 admission August 26 CT chest images independently reviewed: No pulmonary embolism, diffuse interlobular septal  thickening, pleural nodularity noted, areas of groundglass opacification with some patchy distribution particularly in the lung base, no significant pleural effusion, mild reactive appearing lymphadenopathy in the mediastinum Acute respiratory failure with hypoxemia in the setting of diffuse parenchymal lung disease of undetermined etiology: This is somewhat of a complex case given his underlying asthma and the fact that he had COVID in 2022 and in April 2022 a CT scan of his abdomen showed some ill-defined interstitial changes in the periphery of his lungs on lung windows in the bases.  Based on the time course of his illness and reports in the literature I am most concerned about [name] lung toxicity (see citation below), though it is also important to evaluate for another underlying and progressive primary pulmonary ILD or less likely an opportunistic infection (history doesn't seem to support this).  [name] can cause eosinophilic pneumonia which we can assess for with a bronchoscopy, but his imaging characteristics aren't typical for this.  It is possible that the non-specific interstitial changes seen on his lungs in April 2022 were related to his recent COVID infection.  I don't think his baseline asthma explains his symptoms, though he did have a high serum eosinophil count a few weeks prior to admission (unclear significance).  Saglam B, Tye [place] M, Ornek S, Keske S, Tabak L, Cakar N, Zeren H, Aytekin S, Topaz, Ferhanoglu B. Bortezomib induced pulmonary toxicity: a case report and review of the literature. Am J Blood Res. 2020 Dec 15;10(6):407-415. PMID: 96295284; PMCID: [id number].  06/18/21 -   Results for RIHAN, SCHUELER" (MRN [id number]) as of 07/12/2021 09:13  Ref. Range 06/18/2021 12:57  Monocyte-Macrophage-Serous Fluid Latest Ref Range: 50 - 90 % 5 (L)  Other Cells, Fluid Latest Units: % CORRELATE WITH CYTOLOGY.  Color, Fluid Latest Ref Range: YELLOW  PINK (A)  Total  Nucleated Cell Count, Fluid Latest Ref Range: 0 - 1,000 cu mm 103  Fluid Type-FCT Unknown BRONCHIAL ALVEOLAR LAVAGE  Lymphs, Fluid Latest Units: % 18  Eos, Fluid Latest Units: % 51  Appearance, Fluid Latest Ref Range: CLEAR  HAZY (A)  Neutrophil Count, Fluid Latest Ref Range: 0 - 25 % 26 (H)  FINAL MICROSCOPIC DIAGNOSIS:  - No malignant cells identified  - Benign bronchial cells and pulmonary macrophages   Titrate off solumedrol and start prednisone 50 mg daily. Decrease by 10 mg weekly    OV 07/12/2021  Subjective:  Patient ID: Karlton Lemon, male , DOB: 1956/04/24 , age 61 y.o. , MRN: [id number] , ADDRESS: [address] [place] [address] PCP Tally Joe, MD Patient Care Team: Tally Joe, MD as PCP - General (Family Medicine)  This Provider for this visit: Treatment Team:  Attending Provider: Kalman Shan, MD    07/12/2021 -   Chief Complaint  Patient presents with   Hospitalization Follow-up    Pt states he was doing better after being out of the hospital after being placed on steroids. States about a week after being out, states he started having more problems  with SOB and to today, he has had problems with SOB with exertion.   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 -presents for follow-up from the hospital.  I originally met him 10 years ago and then at that point in time he had dyspnea.  He tells me that after that he was exercising and living a good life.  Then in April 2022 got diagnosed incidentally with myeloma multiple.  Says he was caught early.  Then in May 2022 started on [name] and [name].  He says he was doing well with this up until mid July.  Up until then he was walking 1-1/2 2 miles per day 5 days a week.  Then in late July 2022 started noticing shortness of breath.  He met Dr. Rosaline Coma his oncologist on May 25, 2021.  By June 01, 2021 he had severe pulmonary  infiltrates.  He says a part of then he was getting his [name] once a week along with some steroids with each dose.  At this point in time the steroid dose was cut down but he progressed and started getting worse.  Then on June 15, 2021 he was in significant respiratory distress and admitted to the hospital.  I reviewed the hospital records.  He was there for 3 days.  He underwent bronchoscopy with BAL there is also 51% eosinophilia.  Cultures are negative malignant cells are negative.  He was seen by my pulmonary colleagues.  Discussion was held with the [name] other [name] was the etiology.  Given the use and failure it looks like his [name] has been held.  He was discharged on 50 mg prednisone with advised to taper it by 10 mg/week.  Currently is on 20 mg prednisone.  He started playing golf again and feeling really good through Labor Day weekend..  Then on June 29, 2021 his [name] was not given.  Up until this point all chemo was held.  He was given Cytoxan, Zomenta and [name].  The very next in June 30, 2021 he started feeling worse.  He feels [name] is the etiology for this.  On July 06, 2021 he communicated this with Dr. Rosaline Coma and all chemo has been held.  He is being referred to the bone marrow transplant program at [facility] for further evaluation.  At this point in time he says he is much better than when he was in the hospital but definitely not as good as he was prior to getting a rechallenge with [name] on June 29, 2021.  But he notices that he slowly improving.  Walking desaturation test shows that his pulse ox is holding up although he does have a tendency to desaturate.   Did extensive review of the literature.  According to up-to-date [name] is a known etiology for pulm eosinophilia but he got worse after getting rechallenged with [name].  Review of the literature shows that [name] can cause drug-induced pneumonitis and a small fraction of patients.  The  literature is  NO INFOR  as to whether these patients have eosinophilia or not but definitely they seem to be steroid responsive.  This literature is attached below.  In the timeframe also fits in with [name].  He is also noticed to be on allopurinol which rarely can cause hypersensitivity syndrome or dermatitis, hepatitis and eosinophilia - ADDRESS Syndrome but he did not have thse features of rash etc     Nevertheless significant eosinophilia in  the BAL 51% suggest drug-induced pneumonitis.  He currently has significant symptoms of shortness of breath.  He does not have oxygen with him at home.  Simple office walk 185 feet x  3 laps goal with forehead probe 07/12/2021    O2 used ra   Number laps completed 3   Comments about pace slow   Resting Pulse Ox/HR 100% and 69/min   Final Pulse Ox/HR 92% and 81/min   Desaturated </= 88% no   Desaturated <= 3% points Yes 8   Got Tachycardic >/= 90/min no   Symptoms at end of test No complaints   Miscellaneous comments x        Causes of Pulmonary Eosinophilia: from UPTODATE A. Nonsteroidal antiinflammatory drugs (NSAIDs) B. Nitrofurantoin  C. Minocycline D. Sulfonamides/ Sulfamethoxazole E. Ampicillin F. Daptomycin G.Vancomycin H. Dapsone I. Beta-lactam antibiotics J. Nevirapine K. Telaprevir L. Sulfasalazine M. Methotrexate N. Mesalamine O. Amiodarone P. Bleomycin Q. ACE Inhibitor R. Beta blocker S. Hydrochlorothiazides T. L- Tryptophan U. Allopurinol V. Carbamazepine W. Lamotrigine X. Phenytoin Y. Phenindione Z. Fluindione AA. Olanzapine AB. Oxcarbazepine AC. Strontium Ranelate AD. Lenalidomide AE. Radiographic contrast media  Saglam B, Kalyon H, Ozbalak M, Ornek S, Keske S, Tabak L, [place], Zeren H, Aytekin S, Ashley, Sledge B. Bortezomib induced pulmonary toxicity: a case report and review of the literature. Am J Blood Res. 2020 Dec 15;10(6):407-415. PMID: 16109604; PMCID: [id number]. - Bortezomib  related pulmonary toxicities are rarely reported. Although the incidence of Bortezomib induced lung injury (BLI) is unknown, in a large registry study of 1010 MM patients, 45 patients were reported to have BLI by their physician. However, the causality could only be constituted in 26 patients (2.6%), with 5 of them resulting in death despite steroid treatment.In the case reports, the average number of RVD cycles until the toxicity was presented was 6.9, and the period between the development of pulmonary toxicity and the first dose of Bortezomib was 31.1 days -No mention of eosinophilia  Bortezomib therapy-related lung disease in [place] patients with multiple myeloma: Incidence, mortality and clinical characterization Charolette Child [facility] Miyao,2 [place] Masahiko Kusumoto,5 Chauvin Fumikazu NWGNF,6 [place] Sugiyama,8 Kiyohiko Hatake,9 Jenelle Mages Fukuda,10 and Genene Churn [id number]  - Of the 1010 patients registered, 45 (4.5%) developed BILD, 5 (0.50%) of whom had fatal cases. The median time to BILD onset from the first bortezomib dose was 14.5 days, and most of the patients responded well to corticosteroid therapy. A retrospective review by the Lung Injury Medical Expert Panel revealed that the types with capillary leak syndrome and hypoxia without infiltrative shadows were uniquely and frequently observed in patients with BIL - no mention of eosinophilia  [email or web address] =- allopurinol complex multisystem Hypersensitivityey   has a past medical history of Asthma, High cholesterol, Perennial allergic rhinitis, Sciatic pain, right, Seasonal allergic rhinitis, and Thyroid disease.   reports that he quit smoki     07/20/2021  - Visit, NP Cherre Huger    68 year old male former smoker followed in our office for shortness of breath and interstitial lung disease.  Established with Dr. Marchelle Gearing.  Initially  consulted when inpatient with our team in August/2022.  Chief complaint at that point time was dyspnea.  He was diagnosed with multiple myeloma in the spring 2022 when a bruise was discovered while he was recovering from a sports massage.  He went for further work-up and he was found to have abnormal lab work this led to a bone  marrow biopsy confirming diagnosis of multiple myeloma.  He has been followed by Dr. Leonides Schanz.  He was in the middle of treating him with VRD chemotherapy since May/20 04/2021.  He was in the middle of his fifth cycle.  Patient also had COVID in April/2022.  Chest CT in April/2022 showed ill-defined interstitial changes in the periphery.  Concern for [name] lung toxicity.  Patient also had a bronchoscopy performed in 06/18/2021.  Eosinophils 51%, lymphs 18%, no malignant cells identified patient was started on a long-term prednisone taper.  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  Still currently having significant symptoms of shortness of breath.    Last seen in our office on 07/12/2021 by Dr. Marchelle Gearing.  Plan of care at that office visit was as follows: Stop [name], check chest x-ray, check CBC with differential, blood ESR, blood IgE, vitamin D, hemoglobin A1c, G6PD, walk test, check Ono at home, change prednisone to 50 mg daily for 2 weeks, then 40 mg daily for 2 weeks, then 30 mg daily for 2 weeks, then 20 mg daily for 2 weeks, 2-week follow-up with APP or Dr. Marchelle Gearing, 4-week follow-up with Dr. Marchelle Gearing and 30-minute time slot.  Per chart review on 07/13/2021 [name] was discontinued.  Patient was also seen by Dr. Leonides Schanz on 07/18/2021 I am unable to view this note.  Patient reporting that weight team would like for him to resume [name] at a maintenance dose of 10 mg.  This is the current plan.  He will see oncology next Friday for blood work.  Patient is scheduled to complete an overnight oximetry test next Tuesday.  He remains  adherent to his prednisone taper 50 mg daily.  Patient reports that this weekend he did play 9 holes of golf on Saturday and on Sunday.  He was limited by his physical exertion.  He also exercised on Tuesday and worked out with a Psychologist, educational.  Patient is eager to get back to baseline physical activity.  Patient reports that on Wednesday (07/18/2021) of this week he had increased shortness of breath, cough, worsened acid reflux and he vomited.  Patient believes that this may also be due to the fact that he ate a dinner meal quite quickly.  This sometimes happens when he does this.  He also reports that his blood pressure was high at that time.  Patient reports that he has been off the [name] for at least 1 month.  Patient has stopped his allopurinol as of 07/18/2021.  Patient and spouse are both frustrated regarding dyspnea and have hopes that he would be improving quicker.  There are also concerns that he may have acute worsened symptoms or an acute infection such as bronchitis.  We will discuss and evaluate for this today.  Walk today in office: 07/20/2021-completed 2 laps on room air, dropped to 93%  07/26/2021- Interim hx  Patient presents today for 1 week follow-up. ILD felt to be related to drug pneumonitis from [name] as well as [name] for his treatment of multiple myeloma. BAL showed significant eosinophilia. Dr. Leonides Schanz lowered dose of [name] to 10mg  daily. CXR on 07/20/21 showed chronic ILD, no definite acute findings. Ambulatory walk during his last visit showed no oxygen desaturations. During his last visit he was ordered for HRCT, PFTs and ONO.   Accompanied by his wife. He had a bad weekend, his respiratory symptoms have been slightly better the last two days. He is currently off BOTH [name] and [name] (may retry  [name] at lower dose). He stopped using BREO d/t throat irritation. Xanax has helped relieves some anxiety and chest tightness. Wife reports that he is sleeping better. He in  on prolonged prednisone taper. He will be starting 40mg  prednisone tomorrow  x 2 weeks. He is taking Singulair and generic fluticasone nasal spray. He has been off allergy shots since August. HRCT and PFTs are scheduled for next week. Awaiting results to be faxed for ONO from [employer].    OV 08/02/2021 -   Subjective:  Patient ID: Karlton Lemon, male , DOB: Feb 04, 1956 , age 4 y.o. , MRN: [id number] , ADDRESS: [address] [place] [address] PCP Tally Joe, MD Patient Care Team: Tally Joe, MD as PCP - General (Family Medicine)  This Provider for this visit: Treatment Team:  Attending Provider: Kalman Shan, MD  Type of visit: Telephone/Video Circumstance: COVID-19 national emergency Identification of patient WILMAR PRABHAKAR with 1955/12/31 and MRN [id number] - 2 person identifier Risks: Risks, benefits, limitations of telephone visit explained. Patient understood and verbalized agreement to proceed Anyone else on call:  - [phone] Patient location: home + wife on speaker This provider location: 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 CT abdomen/pelvis: Multiple small lytic lesions in the thoracolumbar spine and bilateral pelvis -SPEP: IgG 2,082 (H), M Protein 1.8 (H). IFE shows IgG monoclonal protein with kappa light chain specificity.  -LDH 169, CBC normal, CMP normal except for sodium 131 (L), Chloride 96 (L).   02/14/2021: Establish care with Georga Kaufmann PA-C 02/22/2021: bone marrow biopsy confirms the diagnosis of Multiple Myeloma with a monoclonal plasma cell population.  03/16/2021: Cycle 1 Day 1 of VRd chemotherapy  04/06/2021: Cycle 2 Day 1 of VRd chemotherapy  04/27/2021: Cycle 3 Day 1 of VRd chemotherapy  05/18/2021: Cycle 4 Day 1 of VRd chemotherapy  06/01/2021: drop  dexamethasone to 20mg  PO weekly and start lasix due to shortness of breath.  06/15/2021: Desaturation to 87% on ambulation. HELD velcade today and sent to ED for evaluation.  06/22/2021: Findings consistent with drug reaction the lungs with eosinophils on BAL.  Given these findings we will definitely hold [name] and plan to avoid pomalidomide 06/29/2021: Cycle 1 Day 1 CyBorD chemotherapy   HPI Jarius Dieudonne Shimizu 68 y.o. -there is a telephone visit.  He is supposed to see me next week but he had a CT scan of the chest 2 days ago and had pulmonary function test today.  He really wanted to discuss the results today itself.  Review of the records indicate that he still on prednisone taper.  He is able to ambulate and desaturated only after 200 feet.  There are some mild nocturnal desaturation.  We started 2 L of nasal cannula oxygen.  His CT scan of the chest shows diffuse groundglass opacity in a pattern that is inconsistent with UIP [less than 40% chance this is UIP] suggestive of alternate pattern.  He says he is able to do weight training exercises well but when he walks on a treadmill or does ambulation that is when it bothers him.  When he rests he is better.  He does desaturate to 80s percent.  He is wondering if this could be from asthma.  I told him otherwise.  Touch base with Dr. Leonides Schanz his oncologist.  He is  scheduled for cyclophosphamide tomorrow along with low-dose [name].  This was held off recently in September 2022.  But oncology is wanting to rechallenge him with low-dose [name].  They wanted approval for this.   ONO RA 07/24/21   - - 45 min spent <88%. 7+ hours was > 90%. OVerall not bad  Plan  Start 2L [facility] QHS   CT Chest data  CT Chest High Resolution  Result Date: 07/31/2021 CLINICAL DATA:  Evaluate for interstitial lung disease EXAM: CT CHEST WITHOUT CONTRAST TECHNIQUE: Multidetector CT imaging of the chest was performed following the standard protocol without intravenous  contrast. High resolution imaging of the lungs, as well as inspiratory and expiratory imaging, was performed. COMPARISON:  Chest CT dated June 15, 2021; abdomen and pelvis CT dated February 02, 2021 FINDINGS: Cardiovascular: Cardiomegaly with trace pericardial effusion. Coronary artery calcifications of the RCA and LAD. Atherosclerotic disease of the thoracic aorta. Mediastinum/Nodes: Esophagus is unremarkable. Atrophic thyroid. Mediastinal lymph nodes are decreased in size when compared with prior exam. Reference AP window lymph node on series 2, image 42 measures 1.1 cm in short axis, previously 1.3 cm. Lungs/Pleura: Central airways are patent. Images are motion degraded. Mild diffuse ground-glass opacity with peribronchovascular and subpleural reticular glass opacities and traction bronchiectasis. No clear craniocaudal predominance. Mild bilateral air trapping. 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 Stable solid right middle lobe pulmonary nodule measuring 3 mm on series 3, image 57. Upper Abdomen: No acute abnormality. Musculoskeletal: No chest wall mass or suspicious bone lesions identified. IMPRESSION: Limited evaluation due to respiratory motion artifact. Within limitations, there are diffuse bilateral ground-glass opacities with peribronchovascular and subpleural reticular opacities, traction bronchiectasis and no clear craniocaudal predominance. Differential considerations include sequela of acute lung injury, NSIP, or fibrotic HP given presence of air trapping. Mild subpleural reticular opacities were present on visualized portions of the lung on prior abdomen and pelvis CT dated February 02, 2021, although majority of findings are new. Findings are suggestive of an alternative diagnosis (not UIP) per consensus guidelines: Diagnosis of Idiopathic Pulmonary Fibrosis: An Official ATS/ERS/JRS/ALAT Clinical Practice Guideline. Am Annie Barton Crit Care Med Vol 198, Iss 5, [phone], Jun 21 2017.  Small solid pulmonary nodule the right middle lobe measuring 3 mm. No follow-up needed if patient is low-risk. Non-contrast chest CT can be considered in 12 months if patient is high-risk. This recommendation follows the consensus statement: Guidelines for Management of Incidental Pulmonary Nodules Detected on CT Images: From the Fleischner Society 2017; Radiology 2017; 284:228-243. Aortic Atherosclerosis (ICD10-I70.0). Electronically Signed   By: Avelino Lek M.D.   On: 07/31/2021 12:01         08/16/2021 -  fu drug induced pneumonitis   HPI Daelon Dunivan Weyrauch 68 y.o. - desaturated. STarted on portable o2 since yesterday and Is feeling better. On Room air say he is desaturated.  Called to discuss Bronch . He is scheduled to see DR PAtwardhan 08/22/21 wed at 8.30am. PRefers not to have bronch done at that that tme.  Discussed the consensus about having bronchoscopy with lavage to rule out any opportunistic infections.  At this time I took the opportunity of also recommending transbronchial biopsies.  He did have transbronchial biopsies when he was a lot more hypoxemic in August 2022 we will send for histopathology and was nondiagnostic.  This time I recommended we send it off for RNA genomic classifier for UIP it is not a sensitive test but it is specific.  If it comes  back positive then we would know that there is permanency to this and also its a marker of progression potentially.  He is willing to go through this.  Explained we under general anesthesia. Based on schedule will be myself or Dr. Tonia Brooms doing it.  Explained the following risks   Risks of pneumothorax, hemothorax, sedation/anesthesia complications such as cardiac or respiratory arrest or hypotension, stroke and bleeding all explained. Benefits of diagnosis but limitations of non-diagnosis also explained. Patient verbalized understanding and wished to proceed.    We then discussed pulse dose steroids.  Told him we will have to wait  close to a week to make sure there is fungal smears PCP and AFB smears and bacterial culture negative.  At that point we will have to take a decision on giving 1 g Solu-Medrol for the a day for 3 days.  Ideally would need admission he does not want this.  He wants to do with is on an outpatient setting ideally.  Explained to him about the risks with steroids such as hyperglycemia, hypertension but said that I would try to work with the DME company or outpatient/home nursing to see if this would be possible.  He was appreciative.  Also discussed with Dr. Tonia Brooms who is willing to do the biopsy based on schedule of the operating room, myself and the patient if things do not work out.  Sent a secure chat to Dr. Rosemary Holms.  Told him about the patient.  He will give [place] a clearance on 08/22/2021.  Recommended also look for BNP and heart failure.  CT Chest data  No results found.   OV 08/09/2021  Subjective:  Patient ID: Karlton Lemon, male , DOB: 1956/08/19 , age 81 y.o. , MRN: [id number] , ADDRESS: [address] [place] [address] PCP Tally Joe, MD Patient Care Team: Tally Joe, MD as PCP - General (Family Medicine)  This Provider for this visit: Treatment Team:  Attending Provider: Kalman Shan, MD    08/09/2021 -   Chief Complaint  Patient presents with   Follow-up    Pt states he is about the same since last visit, maybe a little better. Pt is coughing more at times and states he does cough a lot before bed.   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 -returns for follow-up.  He is finishing up 40 mg of prednisone per day.  Restarting 30 mg/day of prednisone tomorrow.  He is not really better.  Today he was able to only walk 2 out of the customary 3 laps.  And he showed a tendency to desaturate.  He says he is extremely anxious.  This is understandable.  He also states  that when he lifts weights in the gym he does not have a problem but when he exerts himself he feels worse.  Previously his nocturnal desaturation test showed abnormality and we recommended night oxygen but he declined per the CMA.  But today he and his wife tell me that they would be interested in getting some oxygen if it would help with shortness of breath.  Simple walking desaturation test does not make him qualify and will need a 6-minute walk test to qualify for portable oxygen.  They are also worried about the lack of improvement.  They are wondering about second opinion.  I did unofficially check with some of my colleagues in the [place].  No clear-cut plan has  been developed.  We discussed about the possibility of visiting Dr. Marcie Bal, ILD group in [place].  They seemed enthused about the idea but wanted me to reach out to Dr. Harrold Donath first.  In terms of his myeloma review of the medical records from office visit 08/03/2021 with Dr. Leonides Schanz and talking to the patient and the labs show the myeloma still in remission.  But Dr. Leonides Schanz is extremely concerned that the myeloma will come back.  Oncology still wants to rechallenge him with [name] but after my personal discussion with Dr. Leonides Schanz on 08/02/2021 and concern for pulmonary toxicity chemotherapy is on hold till patient recovers.  He is also concerned about the presence of coronary artery calcification on his recent CT scan of the chest.  His dad had coronary artery disease diagnosed at 51 and had bypass.  He wants to visit this with Dr. Jacinto Halim again       08/31/2021 -video visit to discuss bronchoscopy results from 08/28/2021 and next step in plan    HPI SAAGAR TORTORELLA 68 y.o. -his bronchoscopy with lavage 08/28/2021 shows 40% lymphocytes.  Anything greater than 30% is against UIP.  His RNA genomic classifier is in progress and that would be a specific test although not a ensitive test for UIP.  Bacterial cultures are  negative.  He continues have shortness of breath and on room air at rest he is fine but sometimes when he goes to the bathroom he can desaturate into the high 80s.  He is frustrated by his condition.  Our original plan was to ensure no opportunistic or bacterial infections [fully understanding that MTB and fungal infections can take 6 weeks] with the current bronchoscopy and to consider bronchoalveolar lavage.  And then based on this we will schedule pulsed dose steroids.    Recently his G6PD is returned is normal.  He is not on Bactrim for prophylaxis.  Current prednisone Is 20mg  per day.  He prefers outpatient treatment plan.  We discussed the side effects of high-dose steroids including opportunistic infection, anxiety and irritability, hypertension, diabetes, other lab abnormalities.  Explained the upset benefit of potentially improving upon current ILD active inflammatory phase.  He is willing to go through this treatment.  He prefers outpatient.  He will require lab and vital sign monitoring.   his wife wanted to know if this could be sarcoidosis.  She is read some case reports of sarcoidosis and myeloma associated.  I expressed to them the clinical features do not fit in with sarcoidosis.  However we can check for autoimmune and sarcoid features  Results for TATSUYA, OKRAY" (MRN [id number]) as of 08/31/2021 16:38 ENVISIA - NEGATIVE FOR UIP   Ref. Range 06/18/2021 12:57 08/28/2021 16:51  Monocyte-Macrophage-Serous Fluid Latest Ref Range: 50 - 90 % 5 (L) 10 (L)  Other Cells, Fluid Latest Units: % CORRELATE WITH CYTOLOGY. MESOTHELIAL AND BRONCHIAL LINING CELLS  Color, Fluid Latest Ref Range: YELLOW  PINK (A) COLORLESS (A)  Total Nucleated Cell Count, Fluid Latest Ref Range: 0 - 1,000 cu mm 103 183  Fluid Type-FCT Unknown BRONCHIAL ALVEOLAR LAVAGE BRONCHIAL ALVEOLAR LAVAGE  Lymphs, Fluid Latest Units: % 18 40  Eos, Fluid Latest Units: % 51 0  Appearance, Fluid Latest Ref Range: CLEAR  HAZY (A)  CLEAR (A)  Neutrophil Count, Fluid Latest Ref Range: 0 - 25 % 26 (H) 50 (H)    CT Chest data  DG CHEST PORT 1 VIEW  Result Date: 08/28/2021 CLINICAL DATA:  Status post bronchoscopy. EXAM: PORTABLE CHEST 1 VIEW COMPARISON:  Chest radiographs 07/20/2021 and CT 07/30/2021 FINDINGS: The cardiac silhouette is borderline enlarged. Lung volumes are chronically low and slightly lower than on the prior radiographs. The interstitial markings are chronically increased diffusely. No definite acute airspace consolidation, overt pulmonary edema, sizable pleural effusion, or pneumothorax is identified. Prominent gaseous distension of the stomach is partially visualized. IMPRESSION: Low lung volumes with chronic interstitial changes. Electronically Signed   By: Sebastian Ache M.D.   On: 08/28/2021 19:10   DG C-ARM BRONCHOSCOPY  Result Date: 08/28/2021 C-ARM BRONCHOSCOPY: Fluoroscopy was utilized by the requesting physician.  No radiographic interpretation.       OV 09/25/2021  Subjective:  Patient ID: Karlton Lemon, male , DOB: 1956-03-01 , age 34 y.o. , MRN: [id number] , ADDRESS: [address] [place] [address] PCP Tally Joe, MD Patient Care Team: Tally Joe, MD as PCP - General (Family Medicine)  This Provider for this visit: Treatment Team:  Attending Provider: Kalman Shan, MD    09/25/2021 -   Chief Complaint  Patient presents with   Follow-up    Pt states that he is beginning to feel better after last visit. States he wears his O2 at 2L majority of the time.  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 Jeanie Sewer - 40mg  once weekly (for multiple myeloma chemotherapy N/V ppx)   04/09/21 - dexamethasone 4mg  tabs - 40  tabs for 28 day supply - Dr. Jeanie Sewer - 40mg  once weekly  (for multiple myeloma chemotherapy N/V ppx)   05/07/21 - dexamethasone 4mg  tabs - 40 tabs for 28 day supply - Dr. Jeanie Sewer - 40mg  once weekly  (for multiple myeloma chemotherapy N/V ppx)   06/06/21 - dexamethasone 4mg  tabs - 40 tabs for 28 day supply - Dr. Jeanie Sewer - decreased to 20mg  once weekly  06/18/21 - prednisone 10mg  tabs - 104 tabs for 34 day supply - Dr. Noralee Stain ------50mg  daily x 7 days, 40 mg daily x 7 days, 30 mg daily x 7 days, 20mg  daily x 7 days, 10mg  daily x 7 days, 5mg  daily x 7 days   07/13/21 - prednisone 10mg  tabs - 296 tab for 30 day supply - Dr. Marchelle Gearing ------50mg  daily x 14 days, 40 mg daily x 14 days, 30mg  daily x 14 days, 20mg  daily thereafter  - Mid Nov 2022  - 1gm solumedrol load x 3 days as outpatient  - 09/25/2021 - 20mg  pred per day   HPI Faris Coolman Loescher 68 y.o. -returns for follow-up.  Since his last visit we did a loading dose of Solu-Medrol 1 g daily x3 days.  This was in mid November 2022.  After that he is gone back to daily prednisone 20 mg/day.  In the midst of the high-dose steroid he did pick up hypertension and we gave him bisoprolol which he says has helped him significantly.  He is run out of the bisoprolol.  I have asked him to contact his primary care physician to manage his hypertension but we will give him a refill.    He is here for follow-up to see his current status.  He tells me that his effort tolerance is better.  He tells me in the gym he is able to do a little bit more work.  This is compared to a few months ago.  He does tell me that gym exercises are easier on him than climbing the stairs.  Stairs - he avoids and gets dyspneic. Not tested his pulse ox on stairs His subjective symptom profile is slightly better compared to October 2022 but his walking desaturation test is around the same.   So suspect amount of his interstitial lung disease might be better but suspect  still remains.  Review of his pulmonary function test from 10 years ago was normal.  In April 2022 he had early ILD.  He currently definitely has ILD.  His RNA genomic classifier is negative.  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  This would make him eligible for nintedanib. He continues on prednisone 20 mg/day.  In terms of his myeloma: He had his wife say that it is still under remission but they are worried about relapse.  They are worried about future direction and treatment of myeloma particularly because he has had issues with treatment that then resulted in acute lung injury.   Of note  - he is frustrated by poor customer service of our office workflows - Rosann Auerbach denies his RNA genomic classifer biopsy    OV 10/19/21  S: call to give update on conversation with Dr Leonides Schanz his hematologist  1. Myeloma - latest dec 2022 blood work back - still under remission. Dr Leonides Schanz indicated that highly unlikely he will be a BMT Candidate for myeloma if his lung. Has appt pending with [facility]. If not a BMT candidate - then cytoxan regimen short term would be used (indicated that is of ptioential benefit to lungs)  2. 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 Would need SLB to ID etiology preciesly but with myelom and Thereapuetic trial with steroids  and his presentation - this has not been a consideratgion till now.  Explained to Dr Leonides Schanz if ILD progresses futher - patient life expectancy is limited. Discussed with patient again and he is agreeable for prednisone 15mg  per day and starting ofev (awaiting donor samples and insurance proces in 2023).   3. WE discussed that probably best to refer to [facility]  transplant and hematology to see if lung transplant would be an option at all if he were to decline esp in setting of myeloma. Maybe a BMT as well . Do not know answers but wil email Dr Thad Ranger at [facility] to get patient in for visit. He might well need a surgical lung biopsy but this can be addressed in due course  Patient and wife agreeable  I spent time emailing Dr Acquanetta Belling trasnplant doc at [facility] - later heard from Dr Thad Ranger- feels that Myleoma will need to be in remssion for >= 5 years before he can be considered lung transplant evaluable. They feel no need to see Mr Paver in transplant clinicl. I subsequetly d/w Mr Monforte - will revert back to holding off lung transplant evaluation. He wil proceed with ofev. He will see [facility] BMT team. Will cosndier a [facility] ILD clinic opinion after d;w hi  2nd Pulm Opinion at [facility] - Dr Jinx Mourning    Comment: The patient seems to have an inflammatory process that has resulted in progressive loss of lung function, worsening hypoxia. Unfortunately on the most recent imaging I do see some signs of fibrosis including a few areas of traction bronchiectasis although I do not see honeycombing or profuse traction bronchiectasis. We did discuss future therapies. I told him that unfortunately since his lung has already suffered injury I think he would be at risk for developing lung injury again and that many chemotherapeutics have been associated lung injury. I also told him these are idiosyncratic reactions and it was not possible to predict who on an individual basis would develop inflammation from chemotherapy to any specific agent. He brought up Cytoxan I told him that while Cytoxan issues for inflammatory lung disease on the other hand it has been associated with pulmonary inflammation. His inflammatory process is steroid responsive which is not unexpected given that eosinophils were found on BAL.  We will check oxygen assessment with exercise to  see how much oxygen as needed with more than ordinary exertion.  Plan:    - Check oxygen assessment  Will discuss with his pulmonary provider Dr. Bertrum Brodie and then Dr. Bernetta Brilliant  Thank you for the opportunity to provide consultation for your patient. If I can be of further assistance please do not hesitate to contact my office.   [facility]   ASSESSMENT / PLAN  1. Interstitial lung disease-severe DLCO reduction (33% predicted) 2. 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 [name] induced)-started August 2022 3. Faint/early ILD present in April 2022 (even prior to starting myeloma treatment) 4. Hypothyroidism-on replacement with Armour thyroid 5. Dyslipidemia 6. History of gout 7. Chronic allergic rhinitis and some sinusitis-on nasal steroids 8. Childhood history of asthma 9. 25 year history of allergy shots (between ages 70 and 67)  43. Multiple myeloma-diagnosed April 2022-received about 5 cycles of [name]/[name]/dexamethasone until August 2022    There definitely appeared to be some very early changes of ILD even back in April 2022 prior to him starting any of his myeloma treatments. However, there definitely appears to be an acute interstitial pneumonitis type picture noted on his CT scan from 06/15/2021. He was taken off all myeloma treatments at that time with concern for drug-induced pneumonitis with both [name] as well as [name] being considered probably agents. He subsequently underwent 1 additional cycle of myeloma treatment with [name] alone (without [name]) and had an acute exacerbation of dyspnea symptoms and ever since then has not received any further myeloma treatment.   I discussed with him that he has been on steroids since about August 2022 and received a steroid bolus in November 2022 with very little change overall in his lung functions. I have mentioned his serial lung function numbers in the HPI above. I do not see any significant change in  these numbers despite him receiving excellent care with Dr. Bertrum Brodie in [place].   His CT chest currently shows mostly fibrotic changes with maybe some active inflammatory component still left (although the extent of active inflammation appears to be very little as compared to the initial scan from August 2022).   I discussed with him that we have very little in terms of treatments to offer him. I do not think the addition of an IL 5 inhibitor or dupilumab would be of any benefit in this situation given that his BAL eosinophil count in November was down to 0%  on steroids along with the fact that his current CT scan does not show much for active inflammation. Dr. Marchelle Gearing had discussed Cytoxan with him, but I think that that would be somewhat aggressive given that he is already got an active bone marrow problem with the myeloma and so I would discourage him from getting Cytoxan.  In terms of treatment options, we are really left with only 1 option which is to rechallenge him with high-dose steroids for the next 6-8 weeks and then reassess both his CT scan as well as pulmonary function studies. With this in mind, I have the following plan for him:  1. Increase oral prednisone from his 10 mg a day that he has been currently using for the past month or so to 60 mg a day. I have outlined to taper for him on the prescription and he will see me back on the 30th of May when he is down to 50 mg a day of prednisone.   2. Continue Bactrim PCP prophylaxis. I have refilled the prescription for him.   3. Continue oxygen supplementation  4. Await the recommendations of our myeloma colleagues in Hematology  5. We cannot refer him for lung transplantation given the active diagnosis of multiple myeloma. This was discussed with him.  Overall, I told him that we likely are dealing with end-stage interstitial lung disease with very little in terms of active inflammation and something that is reversible even with  the prednisone challenge I have outlined. He was given a prescription for Ofev by Dr. Marchelle Gearing, but could not tolerate that due to diarrhea, nausea as well as vomiting. He has an active prescription for Esbriet, but I told him to hold off on that for the next 8 weeks while he is undergoing the prednisone trial so that we do not have any issues with him throwing up on the prednisone and potentially confusing the assessment of treatment response.  If we do not see any improvement in his lung function on CT scan on May 30th, we will slowly taper and discontinue the prednisone and have him start the Esbriet.  With regards to multiple myeloma treatment going forward, I would definitely not challenge him with [name]. I am not so sure about the [name] and if that needs to be given to him in the future, we could potentially consider that under the cover of prednisone immunosuppression. This will need to be careful discussion between his Hematology and Pulmonary team given his severe interstitial lung disease in the lack of any wiggle room if he were to decline/have a flare of pneumonitis.  All questions were answered. The patient was satisfied with the visit. No learning barriers identified during the visit.  OV 12/04/2021  Subjective:  Patient ID: Karlton Lemon, male , DOB: 1956/06/12 , age 27 y.o. , MRN: [id number] , ADDRESS: [address] 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 Jeanie Sewer - 40mg  once weekly (for multiple myeloma chemotherapy N/V ppx)   04/09/21 - dexamethasone 4mg  tabs - 40 tabs for 28 day supply - Dr. Jeanie Sewer - 40mg  once weekly  (for multiple myeloma chemotherapy N/V ppx)   05/07/21 - dexamethasone 4mg  tabs - 40 tabs for 28 day supply - Dr. Jeanie Sewer - 40mg  once weekly  (for multiple myeloma chemotherapy N/V ppx)   06/06/21 - dexamethasone 4mg  tabs - 40 tabs for 28 day supply - Dr. Jeanie Sewer - decreased to 20mg  once weekly  06/18/21 - prednisone 10mg  tabs - 104 tabs for 34 day supply - Dr. Noralee Stain ------50mg  daily x 7 days, 40 mg daily x 7 days, 30 mg daily x 7 days, 20mg  daily x 7 days, 10mg  daily x 7 days, 5mg  daily x 7 days   07/13/21 - prednisone 10mg  tabs - 296 tab for 30 day supply - Dr. Marchelle Gearing ------50mg  daily x 14 days, 40 mg daily x 14 days, 30mg  daily x 14 days, 20mg  daily thereafter  - Mid Nov 2022  - 1gm solumedrol load x 3 days as outpatient  - 09/25/2021 - 20mg  pred per day \ -Late December 2022/early January 2023: Start nintedanib - 12/04/21 -prednisone 15mg  per day      12/04/2021 -   Chief Complaint  Patient presents with   Follow-up    Pt states he stopped taking the OFEV a week ago due to having problems with diarrhea, nausea, and some bleeding still. States since he stopped taking it, he has not had any diarrhea the past 2 days and states the bleeding stopped 2 days ago.     HPI Faizon Capozzi Dutson 68 y.o. -returns for follow-up with his wife.  He tells me that in terms of his multiple myeloma he has visited [facility] hematology department and since then is followed with Dr. Leonides Schanz.  He also saw Dr. Iran Sizer at [facility] pulmonary.  I reviewed Dr. Ross Ludwig notes from earlier this month 2023.  The general feeling that I get is that they are very nervous to do any form of chemotherapy  in him for fear of exacerbating his lung disease.  He is extremely worried about this approach.  There is also trepidation about Cytoxan.  He is really worried about what to do if his myeloma came back.  On the other hand he is also worried about his lungs.  He said he did talk to her known physician call Alm Bustard through Zoom meeting in [place] and he has recommended that patient use 10 L of oxygen with exercise.  He wants higher dose concentrator for this.  I was willing to prescribe this.  He is willing to even pay for this out-of-pocket.  He is also recommended sudden breathing exercises.  Patient is trying different breathing exercises with the hope his lungs can heal.  He is aware that he might be dealing with progressive issues.  He was on nintedanib for a month and early February 2023 he called saying he was having diarrhea and also bloody stools.  He has now stopped nintedanib and for the last week he has not had a diarrhea.  For the last few days there is no bloody stools.  He does not want to do this drug again.  The side effects were quite bad.  There is no further bleeding.  He also believes his hypertension  resolved or improved after stopping nintedanib.  He did have a colonoscopy 5 years ago and since then has not had any problems.  His next colonoscopy might be in another 5 years.  At this point in time he is able to do treadmill exercise on 5 L and walk 30 minutes 1.7 mph.  Nevertheless when we walked him on room air here in office he desaturated quickly.  It seems like his distance to desaturation is gotten worse.  He is worried about both the myeloma and the interstitial lung disease.  We discussed options about getting further opinions.  He says he wants to go to a place with his extreme expertise about this.  We discussed the idea about having to go out of state including [place], [facility] and the [facility].  I do remember a name of Dr. Dia Sitter who is professor at [facility] who is an Database administrator in myeloma.  I did mention this name to him.  I have also written to him.  I also written to 1 Dr. Rozell Searing at the pulmonary department at the [facility].  Based on the response we will facilitate a referral.  Meanwhile I did tell him that we need to protect his lungs against fibrosis.  He wants to go down on his prednisone to 10 mg/day because of the side effects of weight gain.  I agreed to do this provisionally.  But I also recommended antifibrotic pirfenidone.  We discussed the side effects of nausea anorexia and occasionally diarrhea.  He wants to reflect on this.  He is willing to meet with the pharmacist on this.  I made a referral.   No results found.    PFT  OV 01/24/2022  Subjective:  Patient ID: Karlton Lemon, male , DOB: [birth year] , age 76 y.o. , MRN: [id number] , ADDRESS: [address] [place] [address] PCP Tally Joe, MD Patient Care Team: Tally Joe, MD as PCP - General (Family Medicine)  This Provider for this visit: Treatment Team:  Attending Provider: Kalman Shan, MD    01/24/2022 -   Chief Complaint  Patient presents with   Follow-up    PFT performed 12/10/21. Pt recently had a referral with [facility].  Pt states he has been doing okay since last visit   Drug-induced pneumonitis follow-up interstitial lung disease  HPI Kelsey Edman Strebeck 68 y.o. -[facility].  Dr. Rozell Searing also called me last week.  Decided to go with high-dose prednisone because of some groundglass opacities.  Did not want to do concomitant pirfenidone because of potential side effect profile.  Patient has upcoming follow-up appointment Mar 09, 2022 at [facility] with Dr. Rozell Searing.  Also saw the myeloma specialist.  In case of recurrence some alternatives have been recommended.  He tells me that he is getting more optimistic.  He understands the significance of his disease in the severely but he is doing breathing exercises remaining  optimistic.  He is trying an organic diet.  And therefore his positive state of mind is making him feel better.  He does workout on a treadmill at 2.2 mph.  He covers 1.4 miles in 40 minutes.  He uses 8 L of oxygen for this.   Subjective symptom assessment score is documented below in the slightly better from last visit and significantly better compared to October 2022.  Walking desaturation test is improved from 2 months ago but is similar to follow-up 2022  PFT  OV 04/09/2022  Subjective:  Patient ID: Laure Polo, male , DOB: May 01, 1956 , age 64 y.o. , MRN: [id number] , ADDRESS: [address] [place] [address] PCP Rae Bugler, MD Patient Care Team: Rae Bugler, MD as PCP - General (Family Medicine)  This Provider for this visit: Treatment Team:  Attending Provider: Maire Scot, MD    04/09/2022 -   Chief Complaint  Patient presents with   Follow-up    Pt states he has been doing okay since last visit but states he did get covid 6/6 and then after that he has had a cough.     HPI Jadden Yim Vanyo 68 y.o. 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  He had a second visit to [facility].  He saw Dr. Karie Ou for pulmonary.  In terms of his pulmonary status things were deemed to be stable.  He had lung function studies.  His FVC is actually improved to 1.68 L on 03/19/2022 and his DLCO is stable around 10.3./Slightly improved.  Overall his pulmonary function test is stable/trending in the right direction.  He is lost significant amount of weight it is if his improvement could be because of that.  However he was feeling subjectively improved.  According to the [facility] notes pirfenidone is not being recommended because he is stable but he tells me that the decision was left up to him.  Certainly they want him to preserve his lung function and lose more weight.  Today wanted to talk about  pirfenidone but in the interim immediately after coming back March 27, 2022 he emailed me saying that he had COVID.  He believes he got it at [facility].  He has taken antiviral.  After this he is better but he still having some slightly worse dyspnea on exertion than baseline.  Some slightly more subjective use of oxygen at baseline [baseline exertional pulse ox stable/slightly worse].  More coughing and wheezing than baseline.  In terms of his prednisone therapy for his ILD and this is being tapered currently 7.5 mg/day.  [facility] Dr. Virlinda Grimmer has on a tapering regimen to off but in the middle of this he had increased respiratory symptoms.  Wife is also reporting more sciatica as the prednisone is coming down.  In terms of his hematology he has been seen by [facility] multiple myeloma program and they made specific recommendations that I reviewed.  They are going to be in touch with his local hematologist oncologist Dr. Rosaline Coma but currently under remission.  Weight loss has been emphasized.  We discussed Ozempic for weight loss.      OV 06/06/2022  Subjective:  Patient ID: Laure Polo, male , DOB: 1956-07-16 , age 60 y.o. , MRN: [id number] , ADDRESS: [address] [place] [address] PCP Rae Bugler, MD Patient Care Team: Rae Bugler, MD as PCP - General (Family Medicine)  This Provider for this visit: Treatment Team:  Attending Provider: Maire Scot, MD    06/06/2022 -  incent J Tarman 68 y.o. 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  Pt states that he has not been feeling well since getting Covid May 2023 after going to [facility].     HPI Terris J Lofland 68 y.o. -Returns for follow-up.  He presents with his wife Corbin Dess. 2 weeks ago tapered off prednisone. Same time also  at request we started Pirfenidone he has now completed at 2 pills 3 times daily.   He is going to start 3 pills 3 times daily tomorrow.  He says that overall he is not feeling all that well.  His symptom scores of worsened.  He definitely feels more dyspneic.  He is also having worsening cough although his oxygen requirements at home are not any worse.  He is also feeling more tired.  He is having some back pain.  Had MRI and had intrathecal steroid and after that the back pain is better.  He is wondering if all the symptoms are related to coming off prednisone.  At the same time he also states his myeloma is recurring.  His urine M protein is spiking.  He is going to see Dr. Rosaline Coma in September 2023 and get started on the new regimen recommended by [facility] that is potentially less toxic to the lungs     His pulmonary function test is better than December 2022 seems worse than May 2023 when he was at [facility]  Related to prednisone taper.  His walking desaturation test is stable.          12/04/2022 Follow up : ILD , drug-induced pneumonitis, chronic respiratory failure, asthma Patient returns for a 1 month follow-up.  Patient complains that he continues to have ongoing cough, congestion, nasal drainage, shortness of breath.  As above patient has underlying interstitial lung disease with drug-induced pneumonitis, felt secondary to medications used for treatment of his multiple myeloma.  Medications were stopped.  Patient was treated with a steroid challenge.  Patient did try Esbriet for brief time in fall 2023 but was unable to tolerate.  Patient also had COVID 19 infection in 2022 and 2023 and felt that his cough worsened after each episode. Patient's wife is also sick at home with an upper respiratory infection.  Has noticed that his cough is been worse over the last week.  Cough is very aggravating.  And affects his quality life.  Worse at night.  Patient does have a dog at home.  But says he is hypoallergenic.  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  Does not have a hot tub or basement.  No birds or chickens.  COVID-19 test and influenza test today in the office are negative.  Patient denies any hemoptysis, chest pain, orthopnea.  Does have some discolored nasal discharge as dark in the morning but clears as the day goes on.  He had similar symptoms last month and was treated with a 14-day course of antibiotics.  He was also treated in December with antibiotics and steroids.  Patient has been referred to ENT and consult is pending.  He is currently taking Advair twice daily.  Remains on Claritin daily.   OV 12/26/2022  Subjective:  Patient ID: Laure Polo, male , DOB: 03-Apr-1956 , age 65 y.o. , MRN: [id number] , ADDRESS: [address] [place] [address] PCP Rae Bugler, MD Patient Care Team: Rae Bugler, MD as PCP - General (Family Medicine)  This Provider for this visit: Treatment Team:  Attending Provider: Maire Scot, MD    12/26/2022 -   Chief Complaint  Patient presents with   Follow-up    Fatigue, cough and chest congestion today.  Has had some improvement.  Esbriet caused dizziness, GI upset and lethargy.      Follow up : ILD , drug-induced pneumonitis, chronic respiratory failure, asthma Esbriet stopped in  October 2023 due to inability to tolerate due to GI side effects    # IgG Kappa Multiple Myeloma 02/22/2021: bone marrow biopsy confirms the diagnosis of Multiple Myeloma with a monoclonal plasma cell population.   # History of asthma.  Mid February 2020 for mildly elevated IgE and dust mite allergy.  On RAST allergy panel.  HPI Mantaj Chamberlin Chrostowski 68 y.o. -returns for follow-up.  Presents with his wife Corbin Dess.  He has been having some cough and congestion.  Nurse practitioner Frances Ingles in mid February 2024 did RAST allergy panel.  IgE is slightly high.  He has dust mite allergy.  He states he is known to have allergies.  He did have allergy shots for 10 years leading up until the recent illness  and then that was stopped.  His wife feels that the allergy shots did help him.  He has met with Dr. Almeda Jacobs and they are discussing whether to restart the allergy shots.  He wanted to know if there is any contraindication.  I felt that he should definitely try allergy shots or at least Xolair.  Although I was not sure whether he should undergo skin testing again.  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  He is able to play golf although very slowly.  Walking desaturation test today was stable very slowly.  He has not had a pulmonary function test and this is scheduled in a few weeks.  I offered to postpone this given his overall subjective symptoms stability and walking desaturation test ability but he wanted to keep it and come back and see me again within the next few to several weeks.  In terms of his myeloma reviewed Dr. Amparo Balk notes and his M protein is less than 0.5.  Therefore he is still on monitoring plan with plans to start [facility] protocol if myeloma were to relapse.  He is nervous about it because of the recent ILD.  We have agreed to discuss this again if the situation were to arise.     OV 01/20/2023  Subjective:  Patient ID: Laure Polo, male , DOB: 30-Dec-1955 , age 40 y.o. , MRN: [id number] , ADDRESS: [address] [place] [address] PCP Rae Bugler, MD Patient Care Team: Rae Bugler, MD as PCP - General (Family Medicine)  This Provider for this visit: Treatment Team:  Attending Provider: Maire Scot, MD    01/20/2023 -   Chief Complaint  Patient presents with   Follow-up    F/up PFT     HPI Thedora Finlay Dino 68 y.o. -as I got ready to see him I fell ill and declined to see him. Needs to be rescheduled.        OV 06/12/2023  Subjective:  Patient ID: Laure Polo, male , DOB: Aug 27, 1956 , age 46 y.o. , MRN: [id number] , ADDRESS: [address] [place] [address] PCP  Rae Bugler, MD Patient Care Team: Rae Bugler, MD as PCP - General (Family Medicine)  This Provider for this visit: Treatment Team:  Attending Provider: Maire Scot, MD     06/12/2023 -   Chief Complaint  Patient presents with   Follow-up    F/up on PFT    HPI Jaxsen Bernhart Parmelee 68 y.o. -presents for follow-up.  His last visit with me was in March 2024.  And in the April visit had to be canceled because I was sick.  Since  then he has seen Dr. Rosaline Coma his myeloma specialist.  At that visit he reported that he has got a new diagnosis of acid reflux and laryngoscopy showed inflammation.  He still reported that he was using 2 to 3 L oxygen at rest and at home with 10 L at exercise.  The M protein itself was deemed stable and continued monitoring decision was taken through a shared decision-making process.  Decision to start treatment was held off.  He also saw speech-language pathologist on 05/26/2023 in terms of his shortness of breath he feels stable.  In fact his subjective symptom scores are stable.  He says that when he does bench press he does not desaturate but for treadmill he will have to use a lot of oxygen.  However pulmonary function test shows a significant decline 12% compared to the recent 1.  He says he is not feeling it.  He says in fact he did a good cooperative PFT.  His sit/stand hypoxemia exercise test was also adequate.  In February 2024 he did not have any pulmonary hypertension.  His last CT scan was in February 2024 of the chest.  There is no lung cancer.  We discussed about getting BNP and echocardiogram as a screening for pulmonary hypertension and he is interested in this.  The intent would be to treat with inhaled treprostinil if he has pulmonary hypertension.  Meanwhile his M protein levels are higher 0.8.  Therapy is being considered.  I reviewed the intended immunomodulator therapy.  There is no report of any ILD but there is increase of shortness of breath and  respiratory infections.   OV 09/12/2023  Subjective:  Patient ID: Laure Polo, male , DOB: Oct 20, 1956 , age 42 y.o. , MRN: [id number] , ADDRESS: [address] [place] [address] PCP Rae Bugler, MD Patient Care Team: Rae Bugler, MD as PCP - General (Family Medicine)  This Provider for this visit: Treatment Team:  Attending Provider: Maire Scot, MD    09/12/2023 -   Chief Complaint  Patient presents with   Acute Visit    SOB with exertion, cough, nasal and chest congestion, chest tightness, wheezing x 3 weeks.     HPI REBEKAH SPRINKLE 68 y.o. -last seen in August 2024.  There is an acute visit.  His wife Corbin Dess is here with him.  She is worried that he is declining.  She tells me that he is no longer even able to walk up the golf course and he stopped playing golf a few weeks ago.  Patient states that he was playing 9 holes of golf and walking and talking and doing really well.  A few months ago he was drinking turmeric protein shakes and had bad acid reflux and after that started going to speech therapy and was really helping him.  Then in the last 3 weeks because of the change in season to fall with the onset of drop in fall leaves he started having increased nasal congestion, chest congestion, hacking cough constantly clearing his throat worsening dyspnea.  Pending.  A lot of shallow breaths.  Sometimes he feels the shallow breaths are from anxiety he is clearing his throat a lot.  He also feels his chest is in spasms.  He stopped playing golf a week ago.  He was only playing 3 holes last week and got short of breath.  Exertional pulse ox sometimes at home dropped to the 60s.  He had blood work 2 days ago and  other than sodium labs are normal and M protein is 0.7 in October with a November 1 still pending.  Reviewed speech therapy notes.  Last high-resolution CT chest February 2024 without change  Echo September 2024 with grade 1 diastolic dysfunction but good  ejection fraction.  He started a 8-day prednisone taper today that I called in 2 days ago  Review of medication shows he is on Ventolin, aspirin, Breztri, Flonase, guaifenesin, Singulair, Prilosec and now turmeric capsules.  He is also on sildenafil.  Not on anticoagulation.           11/28/2023 Follow up ; ILD, drug-induced pneumonitis, chronic respiratory failure, asthma Patient presents for a 1 month follow-up.  Patient is followed for interstitial lung disease in the setting of drug-induced pneumonitis but disease felt secondary to multiple myeloma drug treatment) patient was seen last visit after a progressive clinical decline with decreased activity tolerance and increased oxygen requirement.  PFTs in August showed decreased lung capacity.  2D echo in September 2024 showed preserved EF and grade 1 diastolic dysfunction.  Lab work with an elevated D-dimer with subsequent CT angio was negative for PE.  Showed mild diffuse groundglass opacities slightly increased in the bases.  And stable lesions on the ribs and scapula.  He was treated for a possible asthmatic bronchitis in early December with prednisone and a Z-Pak.  He had ongoing symptoms and was called in Levaquin.  Last visit patient did have improvement in cough and congestion however continued to have decreased activity tolerance and increased shortness of breath.  He was placed on a low prednisone burst with prednisone 20 mg for 1 week, 10 mg for 2 weeks and then 5 mg daily. He returns today with some improvement.  Patient says he has been able to do slightly more activities and has been able to turn oxygen down to 1 L at rest and 3 L with activity.  He still uses 8 to 9 L with exercise on the treadmill.  A modified barium swallow that showed normal swallow.  Some mild dysmotility.  He is going to see speech therap/ENT for an evaluation.  He also has been referred to pulmonary rehab and begin this week he denies any increased cough,  congestion.  Chest x-ray last visit showed stable chronic changes.  Sputum culture with an adequate.  He has an upcoming high-resolution CT chest in March    OV 01/13/2024  Subjective:  Patient ID: Laure Polo, male , DOB: 24-Aug-1956 , age 75 y.o. , MRN: [id number] , ADDRESS: [address] [place] [address] PCP Rae Bugler, MD Patient Care Team: Rae Bugler, MD as PCP - General (Family Medicine)  This Provider for this visit: Treatment Team:  Attending Provider: Maire Scot, MD    01/13/2024 -   Chief Complaint  Patient presents with   Follow-up    Sob, can't breathe, go over recent CT Scan     HPI Enrigue Hashimi Sorenson 68 y.o. -presents for follow-up.  Since I last saw him several months ago he has declined.  He says since Christmas he is now desaturating even on room air at rest or with minimal exertion.  Pulse ox was 89% room air at rest.  He has been intolerant to previous antifibrotic's.  He is really frustrated by his decline.  He feels his nasal passages are blocked and he is not able to take a deep breath.  He has visited ENT and they have reassured him.  I did  indicate to him with his oxygen demands the nasal passage might not be wide enough to supply the air that he is demanding because of his ILD.  Nevertheless he does have some sinus drainage issues.  We did a CT scan of the chest and clearly he has progression in his ILD.  In addition he is enlarged pulmonary arteries indicated was suggestive of pulmonary hypertension.  I have recommended right heart catheterization and have reached out to Dr. Patwrdaan by electronic medical record.  To facilitate right heart cath.  He is also dealing with significant cough with some dry and white sputum.  I recommend opioids for cough control.  He is open to this idea.  Made a prescription  In terms of his myeloma his M protein is down to 0.5 and he feels it is in remission.   CT Chest data from date: *12/24/23  - personally  visualized and independently interpreted : yes - my findings are: as below  IMPRESSION: 1. Pulmonary parenchymal pattern of interstitial lung disease, as detailed above, stable from 09/12/2023 but progressive from 07/30/2021. 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 Findings are indeterminate for UIP per consensus guidelines: Diagnosis of Idiopathic Pulmonary Fibrosis: An Official ATS/ERS/JRS/ALAT Clinical Practice Guideline. Am Annie Barton Crit Care Med Vol 198, Iss 5, [phone], Jun 21 2017. 2. Aortic atherosclerosis (ICD10-I70.0). Coronary artery calcification. 3. Enlarged pulmonic trunk, indicative of pulmonary arterial hypertension.     Electronically Signed   By: Shearon Denis M.D.   On: 01/06/2024 15:48   OV 02/05/2024  Subjective:  Patient ID: Laure Polo, male , DOB: September 18, 1956 , age 89 y.o. , MRN: [id number] , ADDRESS: [address] [place] [address] PCP Rae Bugler, MD Patient Care Team: Rae Bugler, MD as PCP - General (Family Medicine) Cody Das, MD as PCP - Cardiology (Cardiology)  This Provider for this visit: Treatment Team:  Attending Provider: Maire Scot, MD  Follow up : ILD , drug-induced pneumonitis, chronic respiratory failure, asthma Esbriet stopped in October 2023 due to inability to tolerate due to GI side effects   Ofev spped in 2022 -intolerant  Esbriet stopped in October 2023 due to inability to tolerate due to GI side effects   # IgG Kappa Multiple Myeloma 02/22/2021: bone marrow biopsy confirms the diagnosis of Multiple Myeloma with a monoclonal plasma cell population.   # History of asthma.  Mid February 2020 for mildly elevated IgE and dust mite allergy.  On RAST allergy panel.  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 -returns for  follow-up after right heart catheterization January 25, 2024.  Mean pulmonary artery pressure is 21, capillary wedge pressure is 4.  Overall he states he is doing stable.  He feels his dyspnea is better.  He recognizes that anxiety is contributing significantly to dyspnea he states he is addressing this.  In addition his cough is better because of Hycodan.  He wanted to discuss therapy.  His wife is here as well.  We discussed inhaled treprostinil.  He says he worried about the side effects of the drug.  He prefers the nebulizer as opposed to dry powdered inhaler.  We talked about my experience of 100 for patients not tolerating the drug but also works there is 53 reversible involving cough, sore throat, chest tightness, chest  burn, headache, dizziness.  These are the common ones.  After weighing the pros and cons he is decided to try the nebulizer.  He does understand his pressures 21 mmHg and he meets the definition.  This is in variance with increased study.  However I did feel it was indicated and it could be of potential benefit which is improvement in 6-minute walk distance and also shortness of breath.  There might be a moderate effect on pulmonary fibrosis itself.        SYMPTOM SCALE - ILD 08/09/2021 09/25/2021 218# 12/04/2021 222# 01/24/22 222# 04/09/2022 215# 06/06/2022 211# 06/12/2023 205# 09/12/2023   Current weight    Treadmill 2.2 mph.  As 1.4 miles over 40 minutes.  Uses 8 L oxygen Post may 2nd visit 03/27/22 -2ndcvoid     O2 use ra ra ra ra 2L eert 2L exert O2 with exertion   Shortness of Breath 0 -> 5 scale with 5 being worst (score 6 If unable to do)         At rest 0 0 0 0 0 4 0.5 2  Simple tasks - showers, clothes change, eating, shaving 2 2 1  0/5 0 3 2 3   Household (dishes, doing bed, laundry) x na 1 1 1 3 3 4   Shopping 3 1 1.5 0/5 1 3  0.5 3  Walking level at own pace 4 2 2 1 1 2  2.5 4  Walking up Stairs 5 4 3  3.5 3 2 5 5   Total (30-36) Dyspnea Score 14 9 8.5 6 6 17  14.5 21  How  bad is your cough? 3 1 0.5  3.4 5 Only iwht acid reflux 4  How bad is your fatigue 0 2 0  3   3  How bad is nausea 0 0 0  0 5 0 1  How bad is vomiting?  0 0 0  0 0 0 0  How bad is diarrhea? 0 0 0  0 4 0 1  How bad is anxiety? 5 2 1   0.5 0 6 3  How bad is depression 2 1 1   0.5 0 2 3  Any chronic pain - if so where and how bad x x x  x  x 0     Simple office walk 185 feet x  3 laps goal with forehead probe 07/12/2021  08/09/2021  09/25/2021  12/04/2021  01/24/2022  04/09/2022  06/06/2022  12/26/2022  06/12/2023 09/12/2023   O2 used ra Ra3 ra ra ra ra ra ra ra  Number laps completed 3 3 but did oly 2 3 3  attempted but stopped at 2 due to deats All 3 las Stopped at [employer] all 3 Stopped at 2 1 lap and even that in half away  Comments about pace slow slow slow slow Avg pace Avg  slow slow   Resting Pulse Ox/HR 100% and 69/min 100%ad 74 98% and 75 99% RA and59 98% and HR 60 98% and HR 54 98% nand HR 90 98% n HR 73 100% and HR 64  Final Pulse Ox/HR 92% and 81/min 93% and 87 91% and 91 86% RA and 85 91% and HR 80 (brief 89%) 88% and HR 80 91% and HR 107 90% an HR 79 92% and HR 67 - half lap  Desaturated </= 88% no no no yes ni  no    Desaturated <= 3% points Yes 8 Yes, 7 pponts Yes, 7 points Yes, 13 poins Yes 7 points Yes,  10 points Yes 7 poin    Got Tachycardic >/= 90/min no no yes no no  uyes    Symptoms at end of test No complaints Mod-severe dyspnea Mild dyspnea No complaints none Seemed ok Mild dyspnea    Miscellaneous comments x Worse? stable ? worse     Reduced efforr tolerance     PFT     Latest Ref Rng & Units 06/12/2023   11:30 AM 01/17/2023    2:09 PM 09/26/2022   10:34 AM 08/01/2022    1:57 PM 06/06/2022    1:52 PM 12/10/2021    9:24 AM 10/16/2021    3:56 PM  PFT Results  FVC-Pre L 1.19  1.36  1.32  1.55  1.43  1.49  1.35   FVC-Predicted Pre % 26  29  28   33  30  32  29   Pre FEV1/FVC % % 88  90  85  94  88  94  92   FEV1-Pre L 1.05  1.23  1.12  1.46  1.26  1.40  1.24    FEV1-Predicted Pre % 30  36  32  42  36  40  36   DLCO uncorrected ml/min/mmHg 9.36  12.92  9.61  10.43  13.19  8.64  9.99   DLCO UNC% % 35  48  36  39  49  32  37   DLCO corrected ml/min/mmHg 9.36  13.27  9.96  10.58  13.77  9.20  10.04   DLCO COR %Predicted % 35  49  37  39  51  34  37   DLVA Predicted % 126  127  107  110  128  97  109        LAB RESULTS last 96 hours No results found.       has a past medical history of Asthma, GERD (gastroesophageal reflux disease), High cholesterol, History of blood transfusion, Hypothyroidism, Interstitial lung disease (HCC), Multiple myeloma (HCC), Perennial allergic rhinitis, Pneumonia, Sciatic pain, right, Seasonal allergic rhinitis, and Thyroid disease.   reports that he quit smoking about 41 years ago. His smoking use included cigarettes. He started smoking about 44 years ago. He has a 0.3 pack-year smoking history. He has never used smokeless tobacco.  Past Surgical History:  Procedure Laterality Date   BRONCHIAL BIOPSY  06/18/2021   Procedure: BRONCHIAL BIOPSIES;  Surgeon: Leslye Peer, MD;  Location: [facility];  Service: Cardiopulmonary;;   BRONCHIAL BIOPSY  08/28/2021   Procedure: BRONCHIAL BIOPSIES;  Surgeon: Josephine Igo, DO;  Location: [facility];  Service: Cardiopulmonary;;   BRONCHIAL WASHINGS  06/18/2021   Procedure: BRONCHIAL WASHINGS;  Surgeon: Leslye Peer, MD;  Location: [facility];  Service: Cardiopulmonary;;   BRONCHIAL WASHINGS  08/28/2021   Procedure: BRONCHIAL WASHINGS;  Surgeon: Josephine Igo, DO;  Location: [facility];  Service: Cardiopulmonary;;   NASAL SINUS SURGERY     NECK SURGERY  1996   RIGHT HEART CATH N/A 01/23/2024   Procedure: RIGHT HEART CATH;  Surgeon: Elder Negus, MD;  Location: [facility];  Service: Cardiovascular;  Laterality: N/A;   VIDEO BRONCHOSCOPY N/A 06/18/2021   Procedure: VIDEO BRONCHOSCOPY WITH FLUORO;  Surgeon: Leslye Peer, MD;  Location: [facility];  Service: Cardiopulmonary;  Laterality: N/A;   VIDEO BRONCHOSCOPY N/A 08/28/2021   Procedure: VIDEO BRONCHOSCOPY WITH FLUORO;  Surgeon: Josephine Igo, DO;  Location: [facility];  Service: Cardiopulmonary;  Laterality: N/A;    Allergies  Allergen Reactions   Clarithromycin Other (See Comments)    Hiccups   Ofev [Nintedanib] Other (See Comments)    GI bleeding   Penicillins Other (See Comments)    Child hood unsure    Immunization History  Administered Date(s) Administered   Fluad Quad(high Dose 65+) 08/09/2021, 08/01/2022, 07/22/2023   Influenza Split 10/27/2017   Influenza, High Dose Seasonal PF 12/01/2017, 11/29/2019, 12/19/2020, 12/24/2021   Influenza, Quadrivalent, Recombinant, Inj, Pf 07/28/2019, 08/11/2020   Influenza-Unspecified 11/26/2011, 07/21/2017   PFIZER(Purple Top)SARS-COV-2 Vaccination 12/27/2019, 01/24/2020, 09/21/2020   Tdap 08/20/2005, 09/07/2015   Zoster, Live 11/06/2017, 05/07/2018    Family History  Problem Relation Age of Onset   Hypertension Mother    Allergies Mother    Allergies Father    CAD Father 69   Breast cancer Paternal Grandmother    Hypertension Other    Heart disease Other      Current Outpatient Medications:    acyclovir (ZOVIRAX) 400 MG tablet, Take 1 tablet (400 mg total) by mouth 2 (two) times daily. (Patient taking differently: Take 400 mg by mouth daily.), Disp: 60 tablet, Rfl: 3   albuterol (PROVENTIL) (2.5 MG/3ML) 0.083% nebulizer solution, USE ONE VIAL IN NEBULIZER EVERY 6 HOURS AS NEEDED WHEEZING OR SHORTNESS OF BREATH, Disp: 120 mL, Rfl: 12   albuterol (VENTOLIN HFA) 108 (90 Base) MCG/ACT inhaler, Inhale 2 puffs into the lungs every 6 hours as needed for wheezing or shortness of breath., Disp: 8.5 g, Rfl: 1   ALPRAZolam (XANAX) 0.5 MG tablet, Take 0.5 mg by mouth at bedtime., Disp: , Rfl:    ASSESS FULL RANGE PEAK METER DEVI, as directed., Disp: , Rfl:    azelastine (ASTELIN) 0.1 % nasal spray, Place 2 sprays  into both nostrils 2 (two) times daily. Use in each nostril as directed (Patient taking differently: Place 2 sprays into both nostrils 2 (two) times daily as needed for allergies or rhinitis. Use in each nostril as directed), Disp: 30 mL, Rfl: 12   bisoprolol (ZEBETA) 5 MG tablet, Take 1 tablet (5 mg total) by mouth daily., Disp: 30 tablet, Rfl: 2   Budeson-Glycopyrrol-Formoterol (BREZTRI AEROSPHERE) 160-9-4.8 MCG/ACT AERO, Inhale 2 puffs into the lungs in the morning and at bedtime., Disp: 10.7 g, Rfl: 11   cetirizine (ZYRTEC) 10 MG tablet, Take 1 tablet (10 mg total) by mouth daily., Disp: 30 tablet, Rfl: 11   cholecalciferol (VITAMIN D) 1000 UNITS tablet, Take 3,000 Units by mouth daily., Disp: , Rfl:    dextromethorphan (DELSYM) 30 MG/5ML liquid, Take 30 mg by mouth as needed for cough., Disp: , Rfl:    EPINEPHrine (EPI-PEN) 0.3 mg/0.3 mL DEVI, Inject 0.3 mg into the muscle as needed., Disp: , Rfl:    ezetimibe-simvastatin (VYTORIN) 10-40 MG tablet, Take 1 tablet by mouth daily., Disp: 90 tablet, Rfl: 3   fluticasone (FLONASE) 50 MCG/ACT nasal spray, Place 1 spray into both nostrils daily as needed for allergies or rhinitis., Disp: , Rfl:    HYDROcodone bit-homatropine (HYCODAN) 5-1.5 MG/5ML syrup, Take 5 mLs by mouth every 6 (six) hours as needed for cough., Disp: 240 mL, Rfl: 0   ibuprofen (ADVIL) 200 MG tablet, Take 200 mg by mouth every 6 (six) hours as needed for moderate pain (pain score 4-6)., Disp: , Rfl:    ketoconazole (NIZORAL) 2 % cream, Apply 1 Application topically 2 (two) times daily as needed for irritation., Disp: , Rfl:    levothyroxine (SYNTHROID) 50 MCG tablet, Take 50 mcg by mouth every morning.,  Disp: , Rfl:    liothyronine (CYTOMEL) 5 MCG tablet, Take 5 mcg by mouth daily., Disp: , Rfl:    montelukast (SINGULAIR) 10 MG tablet, Take 10 mg by mouth at bedtime., Disp: , Rfl:    Multiple Vitamin (MULTIVITAMIN) tablet, Take 1 tablet by mouth daily., Disp: , Rfl:    omeprazole  (PRILOSEC) 40 MG capsule, Take 1 capsule (40 mg total) by mouth 2 (two) times daily. Take 30 minutes before a meal, Disp: 60 capsule, Rfl: 3   OXYGEN, Inhale 2 L into the lungs continuous., Disp: , Rfl:    predniSONE (DELTASONE) 5 MG tablet, Daily for 3 weeks and then every other day (Patient taking differently: Take 5 mg by mouth daily with breakfast. Daily for 3 weeks and then every other day), Disp: 30 tablet, Rfl: 1   sildenafil (REVATIO) 20 MG tablet, Take 20 mg by mouth daily as needed (ED)., Disp: , Rfl:    Simethicone (SIMETHICONE ULTRA STRENGTH) 180 MG CAPS, Take 1 capsule (180 mg total) by mouth 3 (three) times daily as needed., Disp: 90 capsule, Rfl: 0   sodium chloride (OCEAN) 0.65 % SOLN nasal spray, Place 1 spray into both nostrils as needed for congestion., Disp: , Rfl:    tamsulosin (FLOMAX) 0.4 MG CAPS capsule, Take 0.4 mg by mouth daily., Disp: , Rfl:    triamcinolone cream (KENALOG) 0.1 %, Apply 1 application topically daily as needed (sun burn itch)., Disp: , Rfl:    Turmeric 400 MG CAPS, Take 1 capsule by mouth daily., Disp: , Rfl:       Objective:   Vitals:   02/05/24 1604  BP: (!) 148/82  Pulse: (!) 58  SpO2: 99%  Weight: 202 lb 4.8 oz (91.8 kg)  Height: 5\' 10"  (1.778 m)    Estimated body mass index is 29.03 kg/m as calculated from the following:   Height as of this encounter: 5\' 10"  (1.778 m).   Weight as of this encounter: 202 lb 4.8 oz (91.8 kg).  @WEIGHTCHANGE @  Filed Weights   02/05/24 1604  Weight: 202 lb 4.8 oz (91.8 kg)     Physical Exam l  General: No distress. Looks well O2 at rest: yes Cane present: no Sitting in wheel chair: no Frail: no Obese: no Neuro: Alert and Oriented x 3. GCS 15. Speech normal Psych: Pleasant Resp:  No overt respiratory distress         Assessment:       ICD-10-CM   1. WHO group 3 pulmonary arterial hypertension (HCC)  I27.23     2. ILD (interstitial lung disease) (HCC)  J84.9     3. Chronic  respiratory failure with hypoxia (HCC)  J96.11     4. Dyspnea on exertion  R06.09     5. Chronic cough  R05.3          Plan:     Patient Instructions  WHO-3 Pulmonary Hypertension - new diagnosis 01/23/24. Mean pressure  Plan  -start [name] NEBULIZER per protocol  - await insurance approval  ILD (interstitial lung disease) (HCC) Drug-induced pneumonitis  =- Fibrosis is worse;  - intolerance to anti fibrotics  Plan - supportive care  Chronic respiratory failure  Plan  - continue o2 as before  Myeloma    M protein < 0.8 in July 2024 and 0.5 in March 2025 and 0.6 in April 2025  Plan  - per [facility] and Dr Leonides Schanz  Anxiety  - affecting quality of life but glad you  are now talking about it and addressing it  Plan  - meditation + talk t PCP  Chronic Cough Cough and congestion/ASthma/SEasonal allergies =  - RAST panel positive for mild IgE elevation and dust mite allerg  -Continue Hycodan cough syrup 5 mg 4 times daily as needed  Folowup May 2025 previiusly scheduled   FOLLOWUP Return for with Dr Bertrum Brodie - May 2025.    SIGNATURE    Dr. Maire Scot, M.D., F.C.C.P,  Pulmonary and Critical Care Medicine Staff Physician, [facility] Director - Interstitial Lung Disease  Program  Pulmonary Fibrosis [facility] at [facility] [place], [place], [address]  Pager: [phone], If no answer or between  15:00h - 7:00h: call [phone] Telephone: [phone]  4:51 PM 02/05/2024

## 2024-02-06 NOTE — Progress Notes (Signed)
 Discharge Progress Report  Patient Details  Name: Samuel [name] MRN: [id number] Date of Birth: Jun 08, 1956 Referring Provider:   Gattis Kass Pulmonary Rehab Walk Test from 10/06/2023 in [facility]  Referring Provider Ramaswamy        Number of Visits: 55  Reason for Discharge:  Patient reached a stable level of exercise. Patient independent in their exercise. Patient has met program and personal goals.  Smoking History:  Social History   Tobacco Use  Smoking Status Former   Current packs/day: 0.00   Average packs/day: 0.1 packs/day for 3.0 years (0.3 ttl pk-yrs)   Types: Cigarettes   Start date: 10/22/1979   Quit date: 10/21/1982   Years since quitting: 41.3  Smokeless Tobacco Never    Diagnosis:  ILD (interstitial lung disease) (HCC)  ADL [facility]:  Pulmonary Assessment Scores     Row Name 10/06/23 1017 02/03/24 0851       ADL [facility]   ADL Phase Entry Exit    SOB Score total 63 72      CAT Score   CAT Score 19 28      mMRC Score   mMRC Score 4 3             Initial Exercise Prescription:  Initial Exercise Prescription - 10/06/23 1000       Date of Initial Exercise RX and Referring Provider   Date 10/06/23    Referring Provider Ramaswamy    Expected Discharge Date 01/01/23      Oxygen   Oxygen Continuous    Liters 2    Maintain Oxygen Saturation 88% or higher      Treadmill   MPH 2    Grade 1    Minutes 15      Recumbant Elliptical   Level 1    RPM 40    Watts 50    Minutes 15      Prescription Details   Frequency (times per week) 2    Duration Progress to 30 minutes of continuous aerobic without signs/symptoms of physical distress      Intensity   THRR 40-80% of Max Heartrate 61-122    Ratings of Perceived Exertion 11-13    Perceived Dyspnea 0-4      Progression   Progression Continue to progress workloads to maintain intensity without signs/symptoms of physical distress.       Resistance Training   Training Prescription Yes    Weight blue bands    Reps 10-15             Discharge Exercise Prescription (Final Exercise Prescription Changes):  Exercise Prescription Changes - 02/03/24 0900       Response to Exercise   Blood Pressure (Admit) 130/80    Blood Pressure (Exercise) 148/82    Blood Pressure (Exit) 108/70    Heart Rate (Admit) 97 bpm   used albuterol  tx   Heart Rate (Exercise) 132 bpm    Heart Rate (Exit) 99 bpm    Oxygen Saturation (Admit) 100 %   8L   Oxygen Saturation (Exercise) 94 %   10L   Oxygen Saturation (Exit) 99 %   10L   Rating of Perceived Exertion (Exercise) 12    Perceived Dyspnea (Exercise) 1    Duration Continue with 30 min of aerobic exercise without signs/symptoms of physical distress.    Intensity THRR unchanged      Progression   Progression Continue to progress workloads  to maintain intensity without signs/symptoms of physical distress.      Resistance Training   Training Prescription Yes    Weight black bands    Reps 10-15    Time 10 Minutes      Oxygen   Oxygen Continuous    Liters 8-15L      Treadmill   MPH --   1.4 decreased to 1   Grade 0    Minutes 15    METs 1.7      Recumbant Elliptical   Level 3    RPM 48    Watts 66    Minutes 15    METs 2.7      Oxygen   Maintain Oxygen Saturation 88% or higher             Functional Capacity:  6 Minute Walk     Row Name 10/06/23 1043 02/05/24 0945       6 Minute Walk   Phase Initial Discharge    Distance 730 feet 925 feet    Distance % Change -- 26.71 %    Distance Feet Change 730 ft 925 ft    Walk Time 6 minutes 6 minutes    # of Rest Breaks 0 0    MPH 1.38 0.7    METS 1.7 2.1    RPE 12 13    Perceived Dyspnea  1.5 2    VO2 Peak 5.97 7.36    Symptoms No No    Resting HR 56 bpm 63 bpm    Resting BP -- 130/82    Resting Oxygen Saturation  95 % 63 %    Exercise Oxygen Saturation  during 6 min walk 89 % 87 %    Max Ex. HR 74  bpm 84 bpm    Max Ex. BP 128/70 122/74    2 Minute Post BP 112/68 112/70      Interval HR   1 Minute HR 68 75    2 Minute HR 72 81    3 Minute HR 74 84    4 Minute HR 74 77    5 Minute HR 74 76    6 Minute HR 74 78    2 Minute Post HR 57 62    Interval Heart Rate? Yes Yes      Interval Oxygen   Interval Oxygen? Yes Yes    Baseline Oxygen Saturation % 95 % 99 %    1 Minute Oxygen Saturation % 97 % 100 %    1 Minute Liters of Oxygen 2 L 0 L    2 Minute Oxygen Saturation % 96 % 96 %    2 Minute Liters of Oxygen 2 L 0 L    3 Minute Oxygen Saturation % 91 % 93 %    3 Minute Liters of Oxygen 2 L 0 L    4 Minute Oxygen Saturation % 89 % 87 %    4 Minute Liters of Oxygen 2 L 8 L  increased to 10L    5 Minute Oxygen Saturation % 90 % 97 %    5 Minute Liters of Oxygen 2 L 10 L    6 Minute Oxygen Saturation % 90 % 97 %    6 Minute Liters of Oxygen 2 L 10 L    2 Minute Post Oxygen Saturation % 97 % 98 %    2 Minute Post Liters of Oxygen 2 L 10 L  Psychological, QOL, Others - Outcomes: PHQ 2/9:    02/03/2024    8:51 AM 10/06/2023   10:16 AM  Depression screen PHQ 2/9  Decreased Interest 0 1  Down, Depressed, Hopeless 1 1  PHQ - 2 Score 1 2  Altered sleeping 0 0  Tired, decreased energy 1 0  Change in appetite 0 0  Feeling bad or failure about yourself  1 1  Trouble concentrating 1 0  Moving slowly or fidgety/restless 0 1  Suicidal thoughts 0 0  PHQ-9 Score 4 4  Difficult doing work/chores Somewhat difficult Somewhat difficult    Quality of Life:   Personal Goals: Goals established at orientation with interventions provided to work toward goal.  Personal Goals and Risk Factors at Admission - 10/06/23 0951       Core Components/Risk Factors/Patient Goals on Admission   Improve shortness of breath with ADL's Yes    Intervention Provide education, individualized exercise plan and daily activity instruction to help decrease symptoms of SOB with activities  of daily living.    Expected Outcomes Short Term: Improve cardiorespiratory fitness to achieve a reduction of symptoms when performing ADLs;Long Term: Be able to perform more ADLs without symptoms or delay the onset of symptoms    Increase knowledge of respiratory medications and ability to use respiratory devices properly  Yes    Intervention Provide education and demonstration as needed of appropriate use of medications, inhalers, and oxygen therapy.    Expected Outcomes Short Term: Achieves understanding of medications use. Understands that oxygen is a medication prescribed by physician. Demonstrates appropriate use of inhaler and oxygen therapy.;Long Term: Maintain appropriate use of medications, inhalers, and oxygen therapy.    Stress Yes    Intervention Offer individual and/or small group education and counseling on adjustment to heart disease, stress management and health-related lifestyle change. Teach and support self-help strategies.;Refer participants experiencing significant psychosocial distress to appropriate mental health specialists for further evaluation and treatment. When possible, include family members and significant others in education/counseling sessions.    Expected Outcomes Short Term: Participant demonstrates changes in health-related behavior, relaxation and other stress management skills, ability to obtain effective social support, and compliance with psychotropic medications if prescribed.;Long Term: Emotional wellbeing is indicated by absence of clinically significant psychosocial distress or social isolation.              Personal Goals Discharge:  Goals and Risk Factor Review     Row Name 10/08/23 1220 11/10/23 0956 12/03/23 1504 12/29/23 1339 01/26/24 1144     Core Components/Risk Factors/Patient Goals Review   Personal Goals Review Develop more efficient breathing techniques such as purse lipped breathing and diaphragmatic breathing and practicing self-pacing  with activity.;Increase knowledge of respiratory medications and ability to use respiratory devices properly.;Improve shortness of breath with ADL's;Stress Develop more efficient breathing techniques such as purse lipped breathing and diaphragmatic breathing and practicing self-pacing with activity.;Increase knowledge of respiratory medications and ability to use respiratory devices properly.;Improve shortness of breath with ADL's;Stress Improve shortness of breath with ADL's;Develop more efficient breathing techniques such as purse lipped breathing and diaphragmatic breathing and practicing self-pacing with activity.;Increase knowledge of respiratory medications and ability to use respiratory devices properly.;Stress Improve shortness of breath with ADL's;Develop more efficient breathing techniques such as purse lipped breathing and diaphragmatic breathing and practicing self-pacing with activity.;Increase knowledge of respiratory medications and ability to use respiratory devices properly.;Stress Improve shortness of breath with ADL's;Develop more efficient breathing techniques such as purse lipped breathing and diaphragmatic  breathing and practicing self-pacing with activity.   Review No changes since orientation. Samuel [name] is scheduled to start the program on 10/14/23 Samuel [name] has been placed on medical hold due to his worsening disease. He attended 3 classes so far. Unable to assess goals yet. His last class was on 10/21/23. He is currently being seen by speech therapy, oncology, and pulmonary. He will continue to benefit from PR for nutrition, education, exercise, and lifestyle modification. Samuel [name] has returned back to class and has attended on session.  Goal progressing for developing more efficient breathing techniques such as purse lipped breathing and diaphragmatic breathing; and practicing self-pacing with activity. Goal progressing for improving shortness of breath with ADL's. Goal progressing for increase knowledge  of respiratory medications and ability to use respiratory devices properly. Goal progressing for improving stress.  We will continue to monitor her progress throughout the program. Goal progressing for developing more efficient breathing techniques such as purse lipped breathing and diaphragmatic breathing; and practicing self-pacing with activity. Goal progressing for improving shortness of breath with ADL's. Samuel [name] is currently using 4-8L O2 while exercising to keep sats >88%. Goal met for increase knowledge of respiratory medications and ability to use respiratory devices properly. Samuel [name] has demonstrated proper use of his MDI to staff. Goal met for improving stress. Samuel [name] states his stress has greatly reduced since starting the program. We will continue to monitor his progress throughout the program. Monthly review of patient's Core Components/Risk Factors/Patient Goals are as follows: Goal met for developing more efficient breathing techniques such as purse lipped breathing and diaphragmatic breathing; and practicing self-pacing with activity. He is able to demonstrate purse lip breathing when he gets short of breath. He also knows how to self pace based on his rate of perceived exertion score. He has also been working on his diaphragmatic breathing at home. Goal progressing for improving shortness of breath with ADL's. Samuel [name] is currently using 8-10L O2 while exercising to keep sats >88%. We will continue to monitor his progress throughout the program.   Expected Outcomes For Samuel [name] to develop more efficient breathing techniques such as purse lipped breathing and diaphragmatic breathing; and practicing self-pacing activities, to increase his knowledge of respiratory medications and ability to use respiratory devices properly, improve his shortness of breath with ADLs, and to reduce stress. For Samuel [name] to develop more efficient breathing techniques such as purse lipped breathing and diaphragmatic breathing; and practicing  self-pacing activities, to increase his knowledge of respiratory medications and ability to use respiratory devices properly, improve his shortness of breath with ADLs, and to reduce stress. For Samuel [name] to develop more efficient breathing techniques such as purse lipped breathing and diaphragmatic breathing; and practicing self-pacing activities, to increase his knowledge of respiratory medications and ability to use respiratory devices properly, improve his shortness of breath with ADLs, and to reduce stress. For Samuel [name] to develop more efficient breathing techniques such as purse lipped breathing and diaphragmatic breathing; and practicing self-pacing activities and improve his shortness of breath with ADLs To improve shortness of breath with ADL's.    Row Name 02/06/24 1057             Core Components/Risk Factors/Patient Goals Review   Personal Goals Review Improve shortness of breath with ADL's       Review Samuel [name] graduated the Pulmonary Rehab program on 02/06/24 completing 18 sessions. Samuel [name]'s Core Components/Risk Factors/Patient Goals are as follows: Goal not met for improving shortness of breath with ADL's. Samuel [name] started the program using 2L of O2  with exertion. At graduation he needed 10-15L O2 while exercising to keep sats >88%. Because of his disease progression his oxygen demands increased. His shortness of breath score increased from a 63 to a 72 and his CAT increased from a 19 to 28. Samuel [name] enjoyed coming to class and is being followed closely by his medical team.       Expected Outcomes To improve shortness of breath with ADL's post Pulmonary Rehab and to maintain O2 saturations >88%.                Exercise Goals and Review:  Exercise Goals     Row Name 10/06/23 [phone]             Exercise Goals   Increase Physical Activity Yes       Intervention Provide advice, education, support and counseling about physical activity/exercise needs.;Develop an individualized exercise prescription for  aerobic and resistive training based on initial evaluation findings, risk stratification, comorbidities and participant's personal goals.       Expected Outcomes Short Term: Attend rehab on a regular basis to increase amount of physical activity.;Long Term: Add in home exercise to make exercise part of routine and to increase amount of physical activity.;Long Term: Exercising regularly at least 3-5 days a week.       Increase Strength and Stamina Yes       Intervention Provide advice, education, support and counseling about physical activity/exercise needs.;Develop an individualized exercise prescription for aerobic and resistive training based on initial evaluation findings, risk stratification, comorbidities and participant's personal goals.       Expected Outcomes Short Term: Increase workloads from initial exercise prescription for resistance, speed, and METs.;Short Term: Perform resistance training exercises routinely during rehab and add in resistance training at home;Long Term: Improve cardiorespiratory fitness, muscular endurance and strength as measured by increased METs and functional capacity ( )       Able to understand and use rate of perceived exertion (RPE) scale Yes       Intervention Provide education and explanation on how to use RPE scale       Expected Outcomes Short Term: Able to use RPE daily in rehab to express subjective intensity level;Long Term:  Able to use RPE to guide intensity level when exercising independently       Able to understand and use Dyspnea scale Yes       Intervention Provide education and explanation on how to use Dyspnea scale       Expected Outcomes Short Term: Able to use Dyspnea scale daily in rehab to express subjective sense of shortness of breath during exertion;Long Term: Able to use Dyspnea scale to guide intensity level when exercising independently       Knowledge and understanding of Target Heart Rate Range (THRR) Yes       Intervention Provide  education and explanation of THRR including how the numbers were predicted and where they are located for reference       Expected Outcomes Short Term: Able to state/look up THRR;Long Term: Able to use THRR to govern intensity when exercising independently;Short Term: Able to use daily as guideline for intensity in rehab       Understanding of Exercise Prescription Yes       Intervention Provide education, explanation, and written materials on patient's individual exercise prescription       Expected Outcomes Short Term: Able to explain program exercise prescription;Long Term: Able to explain home exercise prescription to exercise independently  Exercise Goals Re-Evaluation:  Exercise Goals Re-Evaluation     Row Name 10/08/23 1035 11/03/23 0857 12/01/23 1100 12/31/23 0901 01/26/24 0930     Exercise Goal Re-Evaluation   Exercise Goals Review Increase Physical Activity;Able to understand and use Dyspnea scale;Understanding of Exercise Prescription;Increase Strength and Stamina;Knowledge and understanding of Target Heart Rate Range (THRR);Able to understand and use rate of perceived exertion (RPE) scale Increase Physical Activity;Able to understand and use Dyspnea scale;Understanding of Exercise Prescription;Increase Strength and Stamina;Knowledge and understanding of Target Heart Rate Range (THRR);Able to understand and use rate of perceived exertion (RPE) scale Increase Physical Activity;Able to understand and use Dyspnea scale;Understanding of Exercise Prescription;Increase Strength and Stamina;Knowledge and understanding of Target Heart Rate Range (THRR);Able to understand and use rate of perceived exertion (RPE) scale Increase Physical Activity;Able to understand and use Dyspnea scale;Understanding of Exercise Prescription;Increase Strength and Stamina;Knowledge and understanding of Target Heart Rate Range (THRR);Able to understand and use rate of perceived exertion (RPE) scale  Increase Physical Activity;Able to understand and use Dyspnea scale;Understanding of Exercise Prescription;Increase Strength and Stamina;Knowledge and understanding of Target Heart Rate Range (THRR);Able to understand and use rate of perceived exertion (RPE) scale   Comments Samuel [name] is scheduled to begin exercise on 12/24. Will monitor and progress as able. Samuel [name] has completed 3 exercise sessions. He exercises for 15 min on the recumbent elliptical and treadmill. Samuel [name] averages 2 METs at level 3 on the recumbent elliptical and 2.3 METs at 1.8 mph on the treadmill. He performs the warmup and cooldown standing without limitations. It is too soon to notate any discernable progressions. Samuel [name] did miss class the past week due to illness. Will continue to monitor and progress as able. Samuel [name] has completed 3 exercise sessions. He exercises for 15 min on the recumbent elliptical and treadmill. Samuel [name] averages 2 METs at level 3 on the recumbent elliptical and 2.3 METs at 1.8 mph on the treadmill. He performs the warmup and cooldown standing without limitations.Samuel [name] has missed several sessions due to medical hold and personal committments. He plans to come back 2/11. Will continue to monitor and progress as able. Samuel [name] has completed 11 exercise sessions. He exercises for 15 min on the recumbent elliptical and treadmill. Samuel [name] averages 1.8 METs at level 4 on the recumbent elliptical and 1.8 METs at 1.2 mph on the treadmill. He performs the warmup and cooldown standing without limitations. Samuel [name] has increased his level on the recumbent elliptical, although METs have not increased. His treadmill speed has decreased. Samuel [name] is have a hard time progressing right now due to his shortness of breath. He has noticed an increase in his shortness of breath. Will continue to monitor and progress as able. Samuel [name] has completed 14 exercise sessions. He exercises for 15 min on the recumbent elliptical and treadmill. Samuel [name] averages 2.1 METs at level 4 on the recumbent  elliptical and 2. at 2.0 mph on the treadmill. He performs the warmup and cooldown standing without limitations. Samuel [name] has not increased his level on the recumbent elliptical, although METs have increased. Samuel [name] has also increased his speed on the treadmill. He is becoming deconditioned as it seems his respiratory symptoms have worsened. Samuel [name] has misses several classes due to illness. Will continue to monitor and progress as able.   Expected Outcomes Through exercise at rehab and home, the patient will decrease shortness of breath with daily activities and feel confident in carrying out an exercise regimen at home. Through exercise at rehab and home, the patient will decrease shortness of  breath with daily activities and feel confident in carrying out an exercise regimen at home. Through exercise at rehab and home, the patient will decrease shortness of breath with daily activities and feel confident in carrying out an exercise regimen at home. Through exercise at rehab and home, the patient will decrease shortness of breath with daily activities and feel confident in carrying out an exercise regimen at home. Through exercise at rehab and home, the patient will decrease shortness of breath with daily activities and feel confident in carrying out an exercise regimen at home.    Row Name 02/05/24 1520             Exercise Goal Re-Evaluation   Exercise Goals Review Increase Physical Activity;Able to understand and use Dyspnea scale;Understanding of Exercise Prescription;Increase Strength and Stamina;Knowledge and understanding of Target Heart Rate Range (THRR);Able to understand and use rate of perceived exertion (RPE) scale       Comments Samuel [name] has completed 18 exercise sessions. His peak METs were 2.7 on the recumbent elliptical and 2.4 on the treadmill. Pt will continue to exercise on treadmill at home.       Expected Outcomes Through exercise at rehab and home, the patient will decrease shortness of  breath with daily activities and feel confident in carrying out an exercise regimen at home.                Nutrition & Weight - Outcomes:    Nutrition:  Nutrition Therapy & Goals - 01/29/24 0924       Nutrition Therapy   Diet General Healthy Diet      Personal Nutrition Goals   Nutrition Goal Patient to improve diet quality by using the plate method as a guide for meal planning to include lean protein/plant protein, fruits, vegetables, whole grains, nonfat dairy as part of a well-balanced diet.   Goal in progress.   Comments Goals in progress. 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 He has tried multiple dietary interventions in the past including organic food diet, turmeric protein shakes, etc. He continues regular follow-up with oncology, pulmonology, speech therapy; he has also received multiple opinions from [employer], [facility], [place], etc. Right Heart Cath on 01/23/24 shows "borderline pulmonary hypertension". He is down ~6# since starting with our program. While pulmonology has previously recommended weight loss and even weight loss drugs, this weight loss is unintentional and likely related to increased caloric needs/disease progression, etc. Discussed strategies and gave handouts for weight maintenance, regular protein intake to maintain lean muscle mass,etc. Patient will continue to benefit from participation in pulmonary rehab for nutrition, exercise, and lifestyle modification support.      Intervention Plan   Intervention Prescribe, educate and counsel regarding individualized specific dietary modifications aiming towards targeted core components such as weight, hypertension, lipid management, diabetes, heart failure and other comorbidities.;Nutrition handout(s) given to patient.    Expected Outcomes Short Term Goal: Understand basic principles of dietary content, such as calories, fat, sodium, cholesterol and  nutrients.;Long Term Goal: Adherence to prescribed nutrition plan.             Nutrition Discharge:   Education Questionnaire Score:  Knowledge Questionnaire Score - 02/03/24 0851       Knowledge Questionnaire Score   Post Score 17/18            Samuel [name] graduated the Pulmonary Rehab Program on 02/05/24 completing 18 sessions. His psy/soc post program PHQ 2 & 9  were 1 & 4. He states that his anxiety is worse due to the recent diagnosis of pulmonary hypertension and starting new medication. Spoke to patient about his mental health and patient agreeable for a referral to a mental health professional. He feels discouraged about his health and his disease progression. He is trying to stay hopeful, but states "it is hard". He states he gets a little glimpse of hope when he comes to class. He is compliant with taking his psychotropic meds. He has great support from his wife.   Samuel [name]'s Core Components/Risk Factors/Patient Goals are as follows: Goal not met for improving shortness of breath with ADL's. Samuel [name] started the program using 2L of O2 with exertion. At graduation he needed 10-15L O2 while exercising to keep sats >88%. Because of his disease progression his oxygen demands increased. His shortness of breath score increased from a 63 to a 72 and his CAT increased from a 19 to 28. Samuel [name] enjoyed coming to class and is being followed closely by his medical team.   Goals reviewed with patient; copy given to patient.

## 2024-02-18 ENCOUNTER — Telehealth: Payer: Self-pay | Admitting: Pharmacist

## 2024-02-18 MED ORDER — HYDROCODONE BIT-HOMATROP MBR 5-1.5 MG/5ML PO SOLN: 5.0000 mL | Freq: Four times a day (QID) | ORAL | 0 refills | Status: DC | PRN

## 2024-02-18 NOTE — Telephone Encounter (Signed)
 Plse do a hycodan refill

## 2024-02-18 NOTE — Telephone Encounter (Signed)
 Done let him knoiw

## 2024-02-18 NOTE — Telephone Encounter (Signed)
 Pt notified rx snet

## 2024-02-18 NOTE — Telephone Encounter (Signed)
 For some reason, patient's [name] forms were faxed by either front desk or CMA when they should have been placed with pharmacy team as we had placed on sticky note.  [name] enrollment completed and faxed to [facility]. Rx form indicates nebulization formulation which will not be covered under Part B based on PH-ILD criteria. DPI is already approved. D/w with Dr. Bertrum Brodie and patient - in agreement to move forward with DPI formulation to prevent delay in care  Submitted completed referral paperwork to [facility] for [name] along with HRCT, right heart cath results, and most recent progress note.  Fax# [phone] Phone# [phone]  Geraldene Kleine, PharmD, MPH, BCPS, CPP Clinical Pharmacist (Rheumatology and Pulmonology)

## 2024-02-18 NOTE — Telephone Encounter (Signed)
 Pt called requesting refill on Hycodan . Routing to Dr. Bertrum Brodie and Gurney Lefort. Patient advised that I am unable to refill controlled medications

## 2024-02-19 ENCOUNTER — Ambulatory Visit (INDEPENDENT_AMBULATORY_CARE_PROVIDER_SITE_OTHER): Payer: Self-pay | Admitting: Behavioral Health

## 2024-02-19 DIAGNOSIS — R4589 Other symptoms and signs involving emotional state: Secondary | ICD-10-CM

## 2024-02-19 DIAGNOSIS — F329 Major depressive disorder, single episode, unspecified: Secondary | ICD-10-CM | POA: Diagnosis not present

## 2024-02-19 DIAGNOSIS — F419 Anxiety disorder, unspecified: Secondary | ICD-10-CM

## 2024-02-19 NOTE — Progress Notes (Signed)
   Deneise Lever, LMFT

## 2024-02-19 NOTE — Progress Notes (Signed)
 Samuel [name] Behavioral Health Counselor Initial Adult Exam  Name: Samuel [name] Date: 05/20/2024 MRN: [id number] DOB: 04-05-1956 PCP: Samuel Lenis, MD  Time spent: 60 min Caregility video; Pt & Wife Samuel [name] are present today on Caregility video; they have privacy & understand the risks/limitations of telehealth. They consent to Tx today. Provider working remotely from [employer]. Time In: 3:00pm Time Out: 4:00pm  Guardian/Payee:  Medicare [name] A & B    Paperwork requested: No   Reason for Visit /Presenting Problem: Heightened anx/dep & stress due to rapid health status changing invlg Pulmonary issues.  Mental Status Exam: Appearance:   Casual and Neat     Behavior:  Appropriate and Sharing  Motor:  Normal  Speech/Language:   Clear and Coherent  Affect:  Appropriate  Mood:  anxious  Thought process:  normal  Thought content:    Rumination  Sensory/Perceptual disturbances:    WNL  Orientation:  oriented to person, place, time/date, and situation  Attention:  Good  Concentration:  Good  Memory:  WNL  Fund of knowledge:   Good; need psychoedu re: [place] COVID & Respiratory issues/disease  Insight:    Good  Judgment:   Good  Impulse Control:  Good   Risk Assessment: Danger to Self:  No Self-injurious Behavior: No Danger to Others: No Duty to Warn:no Physical Aggression / Violence:No  Access to Firearms a concern: No  Gang Involvement:No  Patient / guardian was educated about steps to take if suicide or homicide risk level increases between visits: yes; appropriate to ICD process While future psychiatric events cannot be accurately predicted, the patient does not currently require acute inpatient psychiatric care and does not currently meet [place]  involuntary commitment criteria.  Substance Abuse History: Current substance abuse: No     Past Psychiatric History:   No previous psychological problems have been observed Outpatient Providers: Dr. Lenis Seabron, MD,-  General Family Med History of Psych Hospitalization: No  Psychological Testing: NA   Abuse History:  Victim of: No., NA   Report needed: No. Victim of Neglect:No. Perpetrator of NA  Witness / Exposure to Domestic Violence: No   Protective Services Involvement: No  Witness to [employer] Violence:  No   Family History:  Family History  Problem Relation Age of Onset   Hypertension Mother    Allergies Mother    Allergies Father    CAD Father 77   Breast cancer Paternal Grandmother    Hypertension Other    Heart disease Other     Living situation: the patient lives with their spouse  Sexual Orientation: Straight  Relationship Status: married  Name of spouse / other: Wife Samuel [name] If a parent, number of children / ages: NA  Support Systems: spouse Samuel [name] Friends from [employer]-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 Pt uses Oxygen in the shower-carefully. If Pt goes into work @ [employer] he takes Metallurgist. Private, self-pay for  Oxygen has posed many hardships w/Providers of this product.   Strengths: Supportive Relationships, Family, Friends, Hopefulness, Self Advocate, Able to Communicate Effectively, and Pt is open to psychotherapy process & understands Clinician's training in Medical Family Th.  Barriers:  Today, we have watched the length & pacing of our session to provide comfort & reasonable pacing  for Pt in need of Oxygen. Pt tires easily depending on the day, time, & other activities planned, so we will remain aware of Pt comfort levels @ all times.   Legal History: Pending legal issue / charges: The patient has no significant history of legal issues. History of legal issue / charges: NA  Medical History/Surgical History: reviewed Past Medical History:  Diagnosis Date   Asthma    GERD (gastroesophageal reflux disease)    High cholesterol    under control.    History of blood transfusion    Hypothyroidism    Interstitial lung disease (HCC)    Multiple myeloma (HCC)    Perennial allergic rhinitis    Pneumonia    Sciatic pain, right    Seasonal allergic rhinitis    Thyroid disease     Past Surgical History:  Procedure Laterality Date   BRONCHIAL BIOPSY  06/18/2021   Procedure: BRONCHIAL BIOPSIES;  Surgeon: Shelah Lamar RAMAN, MD;  Location: [facility];  Service: Cardiopulmonary;;   BRONCHIAL BIOPSY  08/28/2021   Procedure: BRONCHIAL BIOPSIES;  Surgeon: Brenna Adine CROME, DO;  Location: [facility];  Service: Cardiopulmonary;;   BRONCHIAL WASHINGS  06/18/2021   Procedure: BRONCHIAL WASHINGS;  Surgeon: Shelah Lamar RAMAN, MD;  Location: [facility];  Service: Cardiopulmonary;;   BRONCHIAL WASHINGS  08/28/2021   Procedure: BRONCHIAL WASHINGS;  Surgeon: Brenna Adine CROME, DO;  Location: [facility];  Service: Cardiopulmonary;;   NASAL SINUS SURGERY     NECK SURGERY  1996   RIGHT HEART CATH N/A 01/23/2024   Procedure: RIGHT HEART CATH;  Surgeon: Elmira Newman PARAS, MD;  Location: [facility];  Service: Cardiovascular;  Laterality: N/A;   VIDEO BRONCHOSCOPY N/A 06/18/2021   Procedure: VIDEO BRONCHOSCOPY WITH FLUORO;  Surgeon: Shelah Lamar RAMAN, MD;  Location: [facility];  Service: Cardiopulmonary;  Laterality: N/A;   VIDEO BRONCHOSCOPY N/A 08/28/2021   Procedure: VIDEO BRONCHOSCOPY WITH FLUORO;  Surgeon: Brenna Adine CROME, DO;  Location: [facility];  Service: Cardiopulmonary;   Laterality: N/A;    Medications: Current Outpatient Medications  Medication Sig Dispense Refill   acyclovir  (ZOVIRAX ) 400 MG tablet Take 1 tablet (400 mg total) by mouth 2 (two) times daily. 60 tablet 3   albuterol  (PROVENTIL ) (2.5 MG/3ML) 0.083% nebulizer solution USE ONE VIAL IN NEBULIZER EVERY 6 HOURS AS NEEDED WHEEZING OR SHORTNESS OF BREATH 120 mL 12   albuterol  (VENTOLIN  HFA) 108 (90 Base) MCG/ACT inhaler Inhale 2 puffs into the lungs every 6 hours as needed for wheezing or shortness of breath. 8.5 g 1   ALPRAZolam  (XANAX ) 0.5 MG tablet Take 0.5 mg by mouth at bedtime.     amLODipine  (NORVASC ) 5 MG tablet Take 1 tablet (5 mg total) by mouth daily. 90 tablet 3   ASSESS FULL RANGE PEAK METER DEVI as directed.     azelastine  (ASTELIN ) 0.1 % nasal spray Place 2 sprays into both nostrils 2 (two) times daily. Use in each nostril as directed 30 mL 12   bisoprolol  (ZEBETA ) 5 MG tablet Take 1 tablet (5 mg total) by mouth daily. 30 tablet 2   Budeson-Glycopyrrol-Formoterol (BREZTRI  AEROSPHERE) 160-9-4.8 MCG/ACT AERO Inhale 2  puffs into the lungs in the morning and at bedtime. 10.7 g 11   cetirizine  (ZYRTEC ) 10 MG tablet Take 1 tablet (10 mg total) by mouth daily. 30 tablet 11   cholecalciferol (VITAMIN D) 1000 UNITS tablet Take 3,000 Units by mouth daily.     dextromethorphan (DELSYM) 30 MG/5ML liquid Take 30 mg by mouth as needed for cough.     EPINEPHrine  (EPI-PEN) 0.3 mg/0.3 mL DEVI Inject 0.3 mg into the muscle as needed.     ezetimibe -simvastatin  (VYTORIN ) 10-40 MG tablet Take 1 tablet by mouth daily. 90 tablet 3   fluticasone  (FLONASE) 50 MCG/ACT nasal spray Place 1 spray into both nostrils daily as needed for allergies or rhinitis.     [START ON 07/11/2024] HYDROcodone  bit-homatropine (HYCODAN) 5-1.5 MG/5ML syrup Take 5 mLs by mouth every 4 (four) hours as needed for cough. 900 mL 0   [START ON 06/11/2024] HYDROcodone  bit-homatropine (HYCODAN) 5-1.5 MG/5ML syrup Take 5 mLs by mouth every 4  (four) hours as needed for cough. 900 mL 0   ibuprofen (ADVIL) 200 MG tablet Take 200 mg by mouth every 6 (six) hours as needed for moderate pain (pain score 4-6).     ketoconazole (NIZORAL) 2 % cream Apply 1 Application topically 2 (two) times daily as needed for irritation.     levothyroxine  (SYNTHROID ) 50 MCG tablet Take 50 mcg by mouth every morning.     liothyronine (CYTOMEL) 5 MCG tablet Take 5 mcg by mouth daily.     montelukast  (SINGULAIR ) 10 MG tablet Take 10 mg by mouth at bedtime. (Patient not taking: Reported on 05/12/2024)     Multiple Vitamin (MULTIVITAMIN) tablet Take 1 tablet by mouth daily.     omeprazole  (PRILOSEC) 40 MG capsule Take 1 capsule (40 mg total) by mouth 2 (two) times daily. Take 30 minutes before a meal (Patient taking differently: Take 40 mg by mouth daily. Take 30 minutes before a meal) 60 capsule 3   OXYGEN Inhale 2 L into the lungs continuous.     predniSONE  (DELTASONE ) 10 MG tablet Take 1 tablet (10 mg total) by mouth daily with breakfast. 30 tablet 6   sildenafil (REVATIO) 20 MG tablet Take 20 mg by mouth daily as needed (ED).     Simethicone  (SIMETHICONE  ULTRA STRENGTH) 180 MG CAPS Take 1 capsule (180 mg total) by mouth 3 (three) times daily as needed. 90 capsule 0   sodium chloride  (OCEAN) 0.65 % SOLN nasal spray Place 1 spray into both nostrils as needed for congestion.     tamsulosin  (FLOMAX ) 0.4 MG CAPS capsule Take 0.4 mg by mouth daily.     triamcinolone  cream (KENALOG ) 0.1 % Apply 1 application topically daily as needed (sun burn itch).     Turmeric 400 MG CAPS Take 1 capsule by mouth daily.     No current facility-administered medications for this visit.    Allergies  Allergen Reactions   Amlodipine  Swelling   Beclomethasone Other (See Comments)    unknown   Ciclesonide Other (See Comments)    unknown   Clarithromycin Other (See Comments)    Hiccups   Ofev  [Nintedanib] Other (See Comments)    GI bleeding   Penicillins Other (See Comments)     Child hood unsure   Rosuvastatin      Other Reaction(s): hand swelling   Trazodone     Other Reaction(s): sweating   Olga [Treprostinil Sodium] Cough    Bronchial spasms, sore throat.    Diagnoses:  Anxiety  Anxiety  about health  Reactive depression  Plan of Care: Signe & Samuel [name] will attend all sessions as schedule per their request varying every 2-4 wks. They will have questions ready for our visits as they did today to facilitate our strategic focus on their needs. Clinician will provide psychoedu & we will explore the impact of [place] COVID on the relationship.  Target Date: 03/19/2024  Progress: 5  Frequency: Once every 2-4 wks depending on health stability  Modality: Cpl Th   Richerd LITTIE Ling, LMFT

## 2024-02-25 ENCOUNTER — Inpatient Hospital Stay: Payer: Medicare Other | Attending: Hematology and Oncology

## 2024-02-25 DIAGNOSIS — C9000 Multiple myeloma not having achieved remission: Secondary | ICD-10-CM

## 2024-02-25 LAB — CBC WITH DIFFERENTIAL (CANCER CENTER ONLY)
Abs Immature Granulocytes: 0.01 10*3/uL (ref 0.00–0.07)
Basophils Absolute: 0 10*3/uL (ref 0.0–0.1)
Basophils Relative: 0 %
Eosinophils Absolute: 0.3 10*3/uL (ref 0.0–0.5)
Eosinophils Relative: 4 %
HCT: 39.5 % (ref 39.0–52.0)
Hemoglobin: 13.4 g/dL (ref 13.0–17.0)
Immature Granulocytes: 0 %
Lymphocytes Relative: 13 %
Lymphs Abs: 1 10*3/uL (ref 0.7–4.0)
MCH: 30.7 pg (ref 26.0–34.0)
MCHC: 33.9 g/dL (ref 30.0–36.0)
MCV: 90.4 fL (ref 80.0–100.0)
Monocytes Absolute: 0.6 10*3/uL (ref 0.1–1.0)
Monocytes Relative: 7 %
Neutro Abs: 5.5 10*3/uL (ref 1.7–7.7)
Neutrophils Relative %: 76 %
Platelet Count: 195 10*3/uL (ref 150–400)
RBC: 4.37 MIL/uL (ref 4.22–5.81)
RDW: 12.8 % (ref 11.5–15.5)
WBC Count: 7.4 10*3/uL (ref 4.0–10.5)
nRBC: 0 % (ref 0.0–0.2)

## 2024-02-25 LAB — CMP (CANCER CENTER ONLY)
ALT: 17 U/L (ref 0–44)
AST: 26 U/L (ref 15–41)
Albumin: 4.2 g/dL (ref 3.5–5.0)
Alkaline Phosphatase: 57 U/L (ref 38–126)
Anion gap: 7 (ref 5–15)
BUN: 16 mg/dL (ref 8–23)
CO2: 32 mmol/L (ref 22–32)
Calcium: 9.4 mg/dL (ref 8.9–10.3)
Chloride: 95 mmol/L — ABNORMAL LOW (ref 98–111)
Creatinine: 0.88 mg/dL (ref 0.61–1.24)
GFR, Estimated: >60 mL/min (ref >60)
Glucose, Bld: 94 mg/dL (ref 70–99)
Potassium: 4 mmol/L (ref 3.5–5.1)
Sodium: 134 mmol/L — ABNORMAL LOW (ref 135–145)
Total Bilirubin: 0.4 mg/dL (ref 0.0–1.2)
Total Protein: 8.5 g/dL — ABNORMAL HIGH (ref 6.5–8.1)

## 2024-02-25 LAB — LACTATE DEHYDROGENASE: LDH: 256 U/L — ABNORMAL HIGH (ref 98–192)

## 2024-02-25 NOTE — Telephone Encounter (Signed)
 Per [facility] portal, outreach to pending for [name] is still pending

## 2024-02-26 LAB — KAPPA/LAMBDA LIGHT CHAINS
Kappa free light chain: 50.1 mg/L — ABNORMAL HIGH (ref 3.3–19.4)
Kappa, lambda light chain ratio: 2.43 — ABNORMAL HIGH (ref 0.26–1.65)
Lambda free light chains: 20.6 mg/L (ref 5.7–26.3)

## 2024-02-29 LAB — MULTIPLE MYELOMA PANEL, SERUM
Albumin SerPl Elph-Mcnc: 3.5 g/dL (ref 2.9–4.4)
Albumin/Glob SerPl: 0.8 (ref 0.7–1.7)
Alpha 1: 0.3 g/dL (ref 0.0–0.4)
Alpha2 Glob SerPl Elph-Mcnc: 0.9 g/dL (ref 0.4–1.0)
B-Globulin SerPl Elph-Mcnc: 1.1 g/dL (ref 0.7–1.3)
Gamma Glob SerPl Elph-Mcnc: 2.2 g/dL — ABNORMAL HIGH (ref 0.4–1.8)
Globulin, Total: 4.6 g/dL — ABNORMAL HIGH (ref 2.2–3.9)
IgA: 378 mg/dL (ref 61–437)
IgG (Immunoglobin G), Serum: 2351 mg/dL — ABNORMAL HIGH (ref 603–1613)
IgM (Immunoglobulin M), Srm: 133 mg/dL (ref 20–172)
M Protein SerPl Elph-Mcnc: 0.6 g/dL — ABNORMAL HIGH
Total Protein ELP: 8.1 g/dL (ref 6.0–8.5)

## 2024-03-01 ENCOUNTER — Encounter: Payer: Self-pay | Admitting: Physician Assistant

## 2024-03-04 ENCOUNTER — Telehealth (HOSPITAL_BASED_OUTPATIENT_CLINIC_OR_DEPARTMENT_OTHER): Payer: Self-pay

## 2024-03-04 NOTE — Telephone Encounter (Signed)
 Copied from CRM [id number]. Topic: Clinical - Medication Question >> Mar 04, 2024 10:00 AM Margarette Shawl wrote: Reason for CRM:   Pt is contacting clinic to speak with [facility], who has been assisting him with obtaining [name]. He has several questions and would like to move forward with the medication.  Requesting call back  CB#  [phone]

## 2024-03-08 NOTE — Telephone Encounter (Signed)
 Patient was approved for DPI. He again inquired why he is unable to take nebulizer and I advised that his RHC does not show that he'd meet Part B criteria for [name]. He is willing to try DPI and will reach back out to pharmacy to schedule shipment to home  Geraldene Kleine, PharmD, MPH, BCPS, CPP Clinical Pharmacist (Rheumatology and Pulmonology)

## 2024-03-12 ENCOUNTER — Other Ambulatory Visit: Payer: Self-pay | Admitting: Internal Medicine

## 2024-03-12 ENCOUNTER — Ambulatory Visit: Payer: Self-pay | Admitting: Internal Medicine

## 2024-03-12 ENCOUNTER — Telehealth: Payer: Self-pay | Admitting: Family Medicine

## 2024-03-12 NOTE — Telephone Encounter (Signed)
 Copied from CRM [id number]. Topic: Clinical - Medication Refill >> Mar 12, 2024  1:09 PM Juliana Ocean wrote: Medication:  HYDROcodone  bit-homatropine (HYCODAN) 5-1.5 MG/5ML syrup   Has the patient contacted their pharmacy? No  This is the patient's preferred pharmacy:  [facility] [place], [place] - [address] [facility] [address] [place] [address] Phone: [phone] Fax: [phone]    Is this the correct pharmacy for this prescription? Yes If no, delete pharmacy and type the correct one.   Has the prescription been filled recently? Yes  Is the patient out of the medication? Yes  Has the patient been seen for an appointment in the last year OR does the patient have an upcoming appointment? Yes  Can we respond through MyChart? Yes  Agent: Please be advised that Rx refills may take up to 3 business days. We ask that you follow-up with your pharmacy.

## 2024-03-12 NOTE — Telephone Encounter (Signed)
  Chief Complaint: HTN Symptoms: anxiety Frequency: since last night Pertinent Negatives: Patient denies fever, CP, SOB above baseline Disposition: [] ED /[] Urgent Care (no appt availability in office) / [] Appointment(In office/virtual)/ []  [facility]/ [] Home Care/ [] Refused Recommended Disposition /[] [place] [facility]/ [x]  Follow-up with PCP Additional Notes: Pt c/o concern of new INH medication causing HTN. Pt reports taking new Tyzaso INH (Rx by Dr. Bertrum Brodie) yesterday and pt noticed HTN that was likely worsened by his anxiety. Pt reported taking xanax  and went to bed, but today is experiencing elevated BP as well. Readings below. Triager advised pt taking BP medications now and to repeat BP in 30 mins. Triager advised there is no access at [facility] and is an extended weekend d/t Memorial Day so advised pt to call local pharmacist to confirm possible rx interaction and to follow up with PCP for acute sx. Patient verbalized understanding and to call back/go to ED with worsening symptoms.    Copied from CRM [id number]. Topic: Clinical - Red Word Triage >> Mar 12, 2024  3:23 PM Eveleen Hinds B wrote: Red Word that prompted transfer to Nurse Triage: Elevated blood pressure 194-78. Reason for Disposition  Systolic BP  >= 160 OR Diastolic >= 100  Answer Assessment - Initial Assessment Questions 1. BLOOD PRESSURE: "What is the blood pressure?" "Did you take at least two measurements 5 minutes apart?"     Pt reports starting new rx yesterday (Tyzaso INH) that may have caused HTN Endorses some anxiety when taking it yesterday so took xanax  with relief Pt reports BP is "all over" like 120-150/80 2. ONSET: "When did you take your blood pressure?"     1425: 164/108 Pulse: 67 1440: 175/97 Pulse 68 3. HOW: "How did you take your blood pressure?" (e.g., automatic home BP monitor, visiting nurse)     auto 4. HISTORY: "Do you have a history of high blood pressure?"     Denies, 5. MEDICINES: "Are  you taking any medicines for blood pressure?" "Have you missed any doses recently?"    Bisoprolol  - reports did not take this, Triager instructed to take as prescribed so pt took while on the line 6. OTHER SYMPTOMS: "Do you have any symptoms?" (e.g., blurred vision, chest pain, difficulty breathing, headache, weakness)     Difficulty breathing r/t pulm fibrosis (breathing at baseline) Endorses anxiety from medication Denies other sx above  Protocols used: Blood Pressure - High-A-AH

## 2024-03-12 NOTE — Telephone Encounter (Signed)
 Opened triage encounter - see other nurse note 5/23.

## 2024-03-12 NOTE — Telephone Encounter (Signed)
  Copied from CRM [id number]. Topic: Clinical - Medication Refill >> Mar 12, 2024  1:09 PM Juliana Ocean wrote: Medication:  HYDROcodone  bit-homatropine (HYCODAN) 5-1.5 MG/5ML syrup     Has the patient contacted their pharmacy? No   This is the patient's preferred pharmacy:  [facility] [place], [place] - [address] [facility] [address] [place] [address] Phone: [phone] Fax: [phone]       Is this the correct pharmacy for this prescription? Yes If no, delete pharmacy and type the correct one.    Has the prescription been filled recently? Yes   Is the patient out of the medication? Yes   Has the patient been seen for an appointment in the last year OR does the patient have an upcoming appointment? Yes   Can we respond through MyChart? Yes   Agent: Please be advised that Rx refills may take up to 3 business days. We ask that you follow-up with your pharmacy.  TRIAGE NURSE NOTE Called pt to ensure no worsening symptoms, pt confirms no worsening symptoms at this time but "just started new drug [name], one of side effects is cough, just trying to stay ahead of game." Advised call if symptoms or any questions, sending refill request to clinic. Pt verbalized understanding. Please advise.

## 2024-03-16 ENCOUNTER — Telehealth: Payer: Self-pay | Admitting: Cardiology

## 2024-03-16 MED ORDER — HYDROCODONE BIT-HOMATROP MBR 5-1.5 MG/5ML PO SOLN: 5.0000 mL | Freq: Four times a day (QID) | ORAL | 0 refills | Status: DC | PRN

## 2024-03-16 NOTE — Telephone Encounter (Signed)
 IF BP hight but otherwise h eis stable, stop tyvaso for 1-2 days and see what happens to bp  Also please check if he wants oiopiod refill for cough

## 2024-03-16 NOTE — Telephone Encounter (Signed)
 I called and spoke with the pt and notified of response from MR  He states that he held [name] for the past 3 days and BP seems some better  He does not need the cough syrup refill he said  Routing back to MR

## 2024-03-16 NOTE — Telephone Encounter (Signed)
 Message sent to provider (not in office) this is a controlled Rx.

## 2024-03-16 NOTE — Telephone Encounter (Signed)
 Called patient back about message. Patient reported high BP from Friday to Sunday, SBP 190's, 170's, and 160's. Now it's 130's. Patient stated he thinks this is from taking new medication that Dr. Bertrum Brodie prescribed for pulmonary fibrosis. Patient stated he took [name] twice on Thursday and 3 times of Friday. Patient patient did not like that his BP was going up and his BP might had gone up due to anxiety about the new medication. Encouraged patient to keep monitoring his BP and a message would be sent to Dr. Filiberto Hug for further advisement. Patient had talked to Dr. Mardell Shade CMA last week, please see phone note.

## 2024-03-16 NOTE — Telephone Encounter (Signed)
 At this point I sent the opioid refill.  In terms of [name] he should rechallenge himself and see what happens with the blood pressure again.

## 2024-03-16 NOTE — Telephone Encounter (Signed)
 Pt c/o BP issue: STAT if pt c/o blurred vision, one-sided weakness or slurred speech.  STAT if BP is GREATER than 180/120 TODAY.  STAT if BP is LESS than 90/60 and SYMPTOMATIC TODAY  1. What is your BP concern? BP elevated   2. Have you taken any BP medication today? Yes   3. What are your last 5 BP readings? Today 138/80 Friday: 197/113  4. Are you having any other symptoms (ex. Dizziness, headache, blurred vision, passed out)? SOB coming and going

## 2024-03-17 ENCOUNTER — Telehealth: Payer: Self-pay | Admitting: Internal Medicine

## 2024-03-17 NOTE — Telephone Encounter (Signed)
 Fax received from [facility] nursing requesting all there pages to be signed by provider. Will be placing in folder inside of Dr.Ramswamy's box.

## 2024-03-17 NOTE — Telephone Encounter (Signed)
 Pt contacted and advised. Pt agrees with plan of care.

## 2024-03-17 NOTE — Telephone Encounter (Signed)
 I called and spoke with the pt and notified of response from MR  He verbalized understanding  Nothing further needed

## 2024-03-17 NOTE — Telephone Encounter (Signed)
 Continue monitoring blood pressure readings.  If [name] is needed for pulmonary fibrosis, we should not discontinue that medication.  We can always adjust blood pressure medications if necessary.  Hope that helps.  Thanks MJP

## 2024-03-18 MED ORDER — AMLODIPINE BESYLATE 5 MG PO TABS
5.0000 mg | ORAL_TABLET | Freq: Every day | ORAL | 3 refills | Status: DC
Start: 2024-03-18 — End: 2024-03-22

## 2024-03-18 NOTE — Telephone Encounter (Signed)
 Please route back to me once you signed this, thanks

## 2024-03-18 NOTE — Patient Instructions (Signed)
 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 Mean pressure  Plan  -start [name] NEBULIZER per protocol  - await insurance approval  ILD (interstitial lung disease) (HCC) Drug-induced pneumonitis  =- Fibrosis is worse;  - intolerance to anti fibrotics  Plan - supportive care  Chronic respiratory failure  Plan  - continue o2 as before  Myeloma    M protein < 0.8 in July 2024 and 0.5 in March 2025 and 0.6 in April 2025  Plan  - per [facility] and Dr Rosaline Coma  Anxiety  - affecting quality of life but glad you are now talking about it and addressing it  Plan  - meditation + talk t PCP  Chronic Cough Cough and congestion/ASthma/SEasonal allergies =  - RAST panel positive for mild IgE elevation and dust mite allerg  -Continue Hycodan cough syrup 5 mg 4 times daily as needed  Folowup May 2025 previiusly scheduled

## 2024-03-18 NOTE — Telephone Encounter (Signed)
 Pt c/o BP issue: STAT if pt c/o blurred vision, one-sided weakness or slurred speech.  STAT if BP is GREATER than 180/120 TODAY.  STAT if BP is LESS than 90/60 and SYMPTOMATIC TODAY  1. What is your BP concern?   Patient is concerned his BP is trending high  2. Have you taken any BP medication today?  Patient stated he forgot to take his medication and will be taking it now  3. What are your last 5 BP readings?  180/95 150/88 156/90 152/84  4. Are you having any other symptoms (ex. Dizziness, headache, blurred vision, passed out)?  A little bit of a headache   Patient called to report that his BP is still trending high on the [name] medication and wants advice on next steps.

## 2024-03-18 NOTE — Progress Notes (Unsigned)
 IOV 10/04/2011  68 year old male. Allergies and possible asthma. Hypothyroidism. Anxiety state but no formal diagnosis. 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 Reports dyspnea. INsidious onset "all my life". Increased after starting allergy  shots in June 2012. Says during hikes after he gets going it gets better. Same in gym. Notices more when he is starting out or stationary at rest. HE is not sure what it is.  Feels he needs to take a deep breath on occasion. So, now referred to cardiology and pulmonary. Says ICS Qvar in august/sept 2012 made it worse. Outside chart mentions asthma but patient says not sure if he has asthma. But reports childhood hx of asthma diagnosed by a pediatrician at age 60. Says he had similar symptoms.  Up until 1990 used to 10K but even back then had similar symptoms and would need to warm up before feeling better. Then stopped running due to neck problems. Similar during swimming exercise in 1996-1997. Quit swimming 1999. Episodes associated with wheezing but no cough. Unclear if albuterol  helps but on xopenex prn esp before walking. This whole thing feels like he is not getting "enough juice". Never had PFT but recollects normal spirometry at Samuel Samuel [name] office.  He has seen Samuel [name] cardiology for above - treadmill and echo and carotid doppler all pending. Current pulmonary modulating drug is Singulair  only  Chest x-ray 07/16/2011 is clear per my personal review.  xxxxxxxxxxxxxxxxxxxxxxxxxxxxxxxxxxxxxxxxxxxxxxxxxxxxxxxxxxxxxxxxxx  Inpatient consult 06/16/21 68 [name]/o male with a history of multiple myeloma diagnosed in 2022, currently undergoing treatment presented to the [facility] emergency department from his hematology oncology appointment with a chief complaint of dyspnea.  The patient smoked cigarettes briefly as a young adult but since then has lived a relatively  healthy lifestyle staying active with exercise and playing golf.  He was diagnosed with multiple myeloma in the spring 2022 when a bruise was discovered while he was receiving a sports massage.  He went for further work-up and was found to have abnormal lab work, this led to a bone marrow biopsy confirming diagnosis of multiple myeloma.  He has been followed by Samuel. Rosaline [name] who has been treating him with VRd chemotherapy since Mar 16, 2021.  He is currently in the middle of his fifth cycle.     Over the last several weeks he has been developing progressive shortness of breath with exertion.  He says this has been going on for about 3 to 4 weeks and has significantly worsened in the last week.  This is associated with a dry, rare cough.  He denies fevers, chills, leg swelling, weight gain, or mucus production.  In the last several days (just prior to admission) [place] the [place] and while there felt significant shortness of breath so he presented to Samuel. Les [name] office for further evaluation.  He was sent to the emergency room for evaluation further because of the severity of his shortness of breath.  In the emergency department he was noted to have hypoxemia to an O2 saturation of 87% and an abnormal chest exam.  Pulmonary and critical care medicine was consulted for further evaluation.  He says that throughout his adult life he has had what he calls "huffing" when he talks while exerting himself or with exertion.  He says he has been followed by an allergist and has been treated for asthma at times  over the years.  He does not regularly use an inhaler and he says this is not something that he routinely needs.  He was seen by my partner Samuel. Bertrum [name] at 1 point in 2013 during which time he had a normal chest x-ray and normal lung function testing.   He had COVID in April 2022.   June 15, 2021 admission August 26 CT chest images independently reviewed: No pulmonary embolism, diffuse interlobular septal  thickening, pleural nodularity noted, areas of groundglass opacification with some patchy distribution particularly in the lung base, no significant pleural effusion, mild reactive appearing lymphadenopathy in the mediastinum Acute respiratory failure with hypoxemia in the setting of diffuse parenchymal lung disease of undetermined etiology: This is somewhat of a complex case given his underlying asthma and the fact that he had COVID in 2022 and in April 2022 a CT scan of his abdomen showed some ill-defined interstitial changes in the periphery of his lungs on lung windows in the bases.  Based on the time course of his illness and reports in the literature I am most concerned about [name]  lung toxicity (see citation below), though it is also important to evaluate for another underlying and progressive primary pulmonary ILD or less likely an opportunistic infection (history doesn't seem to support this).  [name]  can cause eosinophilic pneumonia which we can assess for with a bronchoscopy, but his imaging characteristics aren't typical for this.  It is possible that the non-specific interstitial changes seen on his lungs in April 2022 were related to his recent COVID infection.  I don't think his baseline asthma explains his symptoms, though he did have a high serum eosinophil count a few weeks prior to admission (unclear significance).  Samuel [name], Samuel [name], Samuel [name], Samuel [name], Samuel [name], Samuel [name], Samuel [name], Samuel [name], Samuel [name], Samuel [name], Samuel [name]. Bortezomib  induced pulmonary toxicity: a case report and review of the literature. Am J Blood Res. 2020 Dec 15;10(6):407-415. PMID: 65784696; PMCID: [id number].  06/18/21 -   Results for Samuel [name]" (MRN [id number]) as of 07/12/2021 09:13  Ref. Range 06/18/2021 12:57  Monocyte-Macrophage-Serous Fluid Latest Ref Range: 50 - 90 % 5 ([name])  Other Cells, Fluid Latest Units: % CORRELATE WITH CYTOLOGY.  Color, Fluid Latest Ref Range: YELLOW  PINK (A)  Total  Nucleated Cell Count, Fluid Latest Ref Range: 0 - 1,000 cu mm 103  Fluid Type-FCT Unknown BRONCHIAL ALVEOLAR LAVAGE  Lymphs, Fluid Latest Units: % 18  Eos, Fluid Latest Units: % 51  Appearance, Fluid Latest Ref Range: CLEAR  HAZY (A)  Neutrophil Count, Fluid Latest Ref Range: 0 - 25 % 26 ([name])  FINAL MICROSCOPIC DIAGNOSIS:  - No malignant cells identified  - Benign bronchial cells and pulmonary macrophages   Titrate off solumedrol and start prednisone  50 mg daily. Decrease by 10 mg weekly    OV 07/12/2021  Subjective:  Patient ID: Samuel [name], male , DOB: 1956-01-08 , age 57 [name].o. , MRN: [id number] , ADDRESS: [address] [place] [address] PCP Rae Bugler, MD Patient Care Team: Rae Bugler, MD as PCP - General (Family Medicine)  This Provider for this visit: Treatment Team:  Attending Provider: Maire Scot, MD    07/12/2021 -   Chief Complaint  Patient presents with   Hospitalization Follow-up    Pt states he was doing better after being out of the hospital after being placed on steroids. States about a week after being out, states he started having more problems with  SOB and to today, he has had problems with SOB with exertion.   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 -presents for follow-up from the hospital.  I originally met him 10 years ago and then at that point in time he had dyspnea.  He tells me that after that he was exercising and living a good life.  Then in April 2022 got diagnosed incidentally with myeloma multiple.  Says he was caught early.  Then in May 2022 started on [name]  and [name] .  He says he was doing well with this up until mid July.  Up until then he was walking 1-1/2 2 miles per day 5 days a week.  Then in late July 2022 started noticing shortness of breath.  He met Samuel. Rosaline [name] his oncologist on May 25, 2021.  By June 01, 2021 he had severe pulmonary  infiltrates.  He says a part of then he was getting his [name]  once a week along with some steroids with each dose.  At this point in time the steroid dose was cut down but he progressed and started getting worse.  Then on June 15, 2021 he was in significant respiratory distress and admitted to the hospital.  I reviewed the hospital records.  He was there for 3 days.  He underwent bronchoscopy with BAL there is also 51% eosinophilia.  Cultures are negative malignant cells are negative.  He was seen by my pulmonary colleagues.  Discussion was held with the [name]  other [name]  was the etiology.  Given the use and failure it looks like his [name]  has been held.  He was discharged on 50 mg prednisone  with advised to taper it by 10 mg/week.  Currently is on 20 mg prednisone .  He started playing golf again and feeling really good through Labor Day weekend..  Then on June 29, 2021 his [name]  was not given.  Up until this point all chemo was held.  He was given Cytoxan , Zomenta and [name] .  The very next in June 30, 2021 he started feeling worse.  He feels [name]  is the etiology for this.  On July 06, 2021 he communicated this with Samuel. Rosaline [name] and all chemo has been held.  He is being referred to the bone marrow transplant program at [facility] for further evaluation.  At this point in time he says he is much better than when he was in the hospital but definitely not as good as he was prior to getting a rechallenge with [name]  on June 29, 2021.  But he notices that he slowly improving.  Walking desaturation test shows that his pulse ox is holding up although he does have a tendency to desaturate.   Did extensive review of the literature.  According to up-to-date [name]  is a known etiology for pulm eosinophilia but he got worse after getting rechallenged with [name] .  Review of the literature shows that [name]  can cause drug-induced pneumonitis and a small fraction of patients.  The  literature is  NO INFOR  as to whether these patients have eosinophilia or not but definitely they seem to be steroid responsive.  This literature is attached below.  In the timeframe also fits in with [name] .  He is also noticed to be on allopurinol  which rarely can cause hypersensitivity syndrome or dermatitis, hepatitis and eosinophilia - ADDRESS Syndrome but he did not have thse features of rash etc     Nevertheless significant eosinophilia in the  BAL 51% suggest drug-induced pneumonitis.  He currently has significant symptoms of shortness of breath.  He does not have oxygen with him at home.  Simple office walk 185 feet x  3 laps goal with forehead probe 07/12/2021    O2 used ra   Number laps completed 3   Comments about pace slow   Resting Pulse Ox/HR 100% and 69/min   Final Pulse Ox/HR 92% and 81/min   Desaturated </= 88% no   Desaturated <= 3% points Yes 8   Got Tachycardic >/= 90/min no   Symptoms at end of test No complaints   Miscellaneous comments x        Causes of Pulmonary Eosinophilia: from UPTODATE A. Nonsteroidal antiinflammatory drugs (NSAIDs) [name]. Nitrofurantoin  C. Minocycline D. Sulfonamides/ Sulfamethoxazole  E. Ampicillin F. Daptomycin G.Vancomycin [name]. Dapsone I. Beta-lactam antibiotics J. Nevirapine K. Telaprevir [name]. Sulfasalazine [name]. Methotrexate N. Mesalamine O. Amiodarone P. Bleomycin Q. ACE Inhibitor R. Beta blocker [name]. Hydrochlorothiazides T. [name]- Tryptophan U. Allopurinol  V. Carbamazepine W. Lamotrigine X. Phenytoin [name]. Phenindione Z. Fluindione AA. Olanzapine AB. Oxcarbazepine AC. Strontium Ranelate AD. Lenalidomide  AE. Radiographic contrast media  Samuel [name], Samuel [name], Samuel [name], Samuel [name], Samuel [name], Samuel [name], [place], Samuel [name], Samuel [name], Samuel [name], Samuel [name]. Bortezomib  induced pulmonary toxicity: a case report and review of the literature. Am J Blood Res. 2020 Dec 15;10(6):407-415. PMID: 40981191; PMCID: [id number]. - Bortezomib   related pulmonary toxicities are rarely reported. Although the incidence of Bortezomib  induced lung injury (BLI) is unknown, in a large registry study of 1010 MM patients, 45 patients were reported to have BLI by their physician. However, the causality could only be constituted in 26 patients (2.6%), with 5 of them resulting in death despite steroid treatment.In the case reports, the average number of RVD cycles until the toxicity was presented was 6.9, and the period between the development of pulmonary toxicity and the first dose of Bortezomib  was 31.1 days -No mention of eosinophilia  Bortezomib  therapy-related lung disease in [place] patients with multiple myeloma: Incidence, mortality and clinical characterization Julia Oats [facility] Miyao,2 Yoshimasa Ogawa,2 Kazuma Ohyashiki,3 Takao Katoh,4 Masahiko Kusumoto,5 [place] Fumikazu YQMVH,8 [place] Sugiyama,8 Kiyohiko Hatake,9 Cindra Cree Fukuda,10 and Vallarie Gauze [id number]  - Of the 1010 patients registered, 45 (4.5%) developed BILD, 5 (0.50%) of whom had fatal cases. The median time to BILD onset from the first bortezomib  dose was 14.5 days, and most of the patients responded well to corticosteroid therapy. A retrospective review by the Lung Injury Medical Expert Panel revealed that the types with capillary leak syndrome and hypoxia without infiltrative shadows were uniquely and frequently observed in patients with BIL - no mention of eosinophilia  [email or web address].[email or web address] =- allopurinol  complex multisystem Hypersensitivityey   has a past medical history of Asthma, High cholesterol, Perennial allergic rhinitis, Sciatic pain, right, Seasonal allergic rhinitis, and Thyroid disease.   reports that he quit smoki     07/20/2021  - Visit, NP Eddy Goodell    68 year old male former smoker followed in our office for shortness of breath and interstitial lung disease.  Established with Samuel. Bertrum [name].  Initially  consulted when inpatient with our team in August/2022.  Chief complaint at that point time was dyspnea.  He was diagnosed with multiple myeloma in the spring 2022 when a bruise was discovered while he was recovering from a sports massage.  He went for further work-up and he was found to have abnormal lab work this led to a bone marrow  biopsy confirming diagnosis of multiple myeloma.  He has been followed by Samuel. Rosaline [name].  He was in the middle of treating him with VRD chemotherapy since May/20 04/2021.  He was in the middle of his fifth cycle.  Patient also had COVID in April/2022.  Chest CT in April/2022 showed ill-defined interstitial changes in the periphery.  Concern for [name]  lung toxicity.  Patient also had a bronchoscopy performed in 06/18/2021.  Eosinophils 51%, lymphs 18%, no malignant cells identified patient was started on a long-term prednisone  taper.  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  Still currently having significant symptoms of shortness of breath.    Last seen in our office on 07/12/2021 by Samuel. Bertrum [name].  Plan of care at that office visit was as follows: Stop [name] , check chest x-ray, check CBC with differential, blood ESR, blood IgE, vitamin D, hemoglobin A1c, G6PD, walk test, check Ono at home, change prednisone  to 50 mg daily for 2 weeks, then 40 mg daily for 2 weeks, then 30 mg daily for 2 weeks, then 20 mg daily for 2 weeks, 2-week follow-up with APP or Samuel. Bertrum [name], 4-week follow-up with Samuel. Bertrum [name] and 30-minute time slot.  Per chart review on 07/13/2021 [name]  was discontinued.  Patient was also seen by Samuel. Rosaline [name] on 07/18/2021 I am unable to view this note.  Patient reporting that weight team would like for him to resume [name]  at a maintenance dose of 10 mg.  This is the current plan.  He will see oncology next Friday for blood work.  Patient is scheduled to complete an overnight oximetry test next Tuesday.  He remains  adherent to his prednisone  taper 50 mg daily.  Patient reports that this weekend he did play 9 holes of golf on Saturday and on Sunday.  He was limited by his physical exertion.  He also exercised on Tuesday and worked out with a Psychologist, educational.  Patient is eager to get back to baseline physical activity.  Patient reports that on Wednesday (07/18/2021) of this week he had increased shortness of breath, cough, worsened acid reflux and he vomited.  Patient believes that this may also be due to the fact that he ate a dinner meal quite quickly.  This sometimes happens when he does this.  He also reports that his blood pressure was high at that time.  Patient reports that he has been off the [name]  for at least 1 month.  Patient has stopped his allopurinol  as of 07/18/2021.  Patient and spouse are both frustrated regarding dyspnea and have hopes that he would be improving quicker.  There are also concerns that he may have acute worsened symptoms or an acute infection such as bronchitis.  We will discuss and evaluate for this today.  Walk today in office: 07/20/2021-completed 2 laps on room air, dropped to 93%  07/26/2021- Interim hx  Patient presents today for 1 week follow-up. ILD felt to be related to drug pneumonitis from [name]  as well as [name]  for his treatment of multiple myeloma. BAL showed significant eosinophilia. Samuel. Rosaline [name] lowered dose of [name]  to 10mg  daily. CXR on 07/20/21 showed chronic ILD, no definite acute findings. Ambulatory walk during his last visit showed no oxygen desaturations. During his last visit he was ordered for HRCT, PFTs and ONO.   Accompanied by his wife. He had a bad weekend, his respiratory symptoms have been slightly better the last two days. He is currently off BOTH [name]  and [name]  (may retry [name]   at lower dose). He stopped using BREO d/t throat irritation. Xanax  has helped relieves some anxiety and chest tightness. Wife reports that he is sleeping better. He in  on prolonged prednisone  taper. He will be starting 40mg  prednisone  tomorrow  x 2 weeks. He is taking Singulair  and generic fluticasone  nasal spray. He has been off allergy  shots since August. HRCT and PFTs are scheduled for next week. Awaiting results to be faxed for ONO from [employer].    OV 08/02/2021 -   Subjective:  Patient ID: Samuel [name], male , DOB: [birth year] , age 42 [name].o. , MRN: [id number] , ADDRESS: [address] [place] [address] PCP Rae Bugler, MD Patient Care Team: Rae Bugler, MD as PCP - General (Family Medicine)  This Provider for this visit: Treatment Team:  Attending Provider: Maire Scot, MD  Type of visit: Telephone/Video Circumstance: COVID-19 national emergency Identification of patient HERON PITCOCK with 1956-06-19 and MRN [id number] - 2 person identifier Risks: Risks, benefits, limitations of telephone visit explained. Patient understood and verbalized agreement to proceed Anyone else on call:  - [phone] Patient location: home + wife on speaker This provider location: 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 CT abdomen/pelvis: Multiple small lytic lesions in the thoracolumbar spine and bilateral pelvis -SPEP: IgG 2,082 ([name]), [name] Protein 1.8 ([name]). IFE shows IgG monoclonal protein with kappa light chain specificity.  -LDH 169, CBC normal, CMP normal except for sodium 131 ([name]), Chloride 96 ([name]).   02/14/2021: Establish care with Wyline Hearing PA-C 02/22/2021: bone marrow biopsy confirms the diagnosis of Multiple Myeloma with a monoclonal plasma cell population.  03/16/2021: Cycle 1 Day 1 of VRd chemotherapy  04/06/2021: Cycle 2 Day 1 of VRd chemotherapy  04/27/2021: Cycle 3 Day 1 of VRd chemotherapy  05/18/2021: Cycle 4 Day 1 of VRd chemotherapy  06/01/2021: drop  dexamethasone  to 20mg  PO weekly and start lasix  due to shortness of breath.  06/15/2021: Desaturation to 87% on ambulation. HELD velcade  today and sent to ED for evaluation.  06/22/2021: Findings consistent with drug reaction the lungs with eosinophils on BAL.  Given these findings we will definitely hold [name]  and plan to avoid pomalidomide 06/29/2021: Cycle 1 Day 1 CyBorD chemotherapy   HPI Samuel [name] 9 [name].o. -there is a telephone visit.  He is supposed to see me next week but he had a CT scan of the chest 2 days ago and had pulmonary function test today.  He really wanted to discuss the results today itself.  Review of the records indicate that he still on prednisone  taper.  He is able to ambulate and desaturated only after 200 feet.  There are some mild nocturnal desaturation.  We started 2 [name] of nasal cannula oxygen.  His CT scan of the chest shows diffuse groundglass opacity in a pattern that is inconsistent with UIP [less than 40% chance this is UIP] suggestive of alternate pattern.  He says he is able to do weight training exercises well but when he walks on a treadmill or does ambulation that is when it bothers him.  When he rests he is better.  He does desaturate to 80s percent.  He is wondering if this could be from asthma.  I told him otherwise.  Touch base with Samuel. Rosaline [name] his oncologist.  He is scheduled  for cyclophosphamide  tomorrow along with low-dose [name] .  This was held off recently in September 2022.  But oncology is wanting to rechallenge him with low-dose [name] .  They wanted approval for this.   ONO RA 07/24/21   - - 45 min spent <88%. 7+ hours was > 90%. OVerall not bad  Plan  Start 2L [place] QHS   CT Chest data  CT Chest High Resolution  Result Date: 07/31/2021 CLINICAL DATA:  Evaluate for interstitial lung disease EXAM: CT CHEST WITHOUT CONTRAST TECHNIQUE: Multidetector CT imaging of the chest was performed following the standard protocol without intravenous  contrast. High resolution imaging of the lungs, as well as inspiratory and expiratory imaging, was performed. COMPARISON:  Chest CT dated June 15, 2021; abdomen and pelvis CT dated February 02, 2021 FINDINGS: Cardiovascular: Cardiomegaly with trace pericardial effusion. Coronary artery calcifications of the RCA and LAD. Atherosclerotic disease of the thoracic aorta. Mediastinum/Nodes: Esophagus is unremarkable. Atrophic thyroid. Mediastinal lymph nodes are decreased in size when compared with prior exam. Reference AP window lymph node on series 2, image 42 measures 1.1 cm in short axis, previously 1.3 cm. Lungs/Pleura: Central airways are patent. Images are motion degraded. Mild diffuse ground-glass opacity with peribronchovascular and subpleural reticular glass opacities and traction bronchiectasis. No clear craniocaudal predominance. Mild bilateral air trapping. 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 Stable solid right middle lobe pulmonary nodule measuring 3 mm on series 3, image 57. Upper Abdomen: No acute abnormality. Musculoskeletal: No chest wall mass or suspicious bone lesions identified. IMPRESSION: Limited evaluation due to respiratory motion artifact. Within limitations, there are diffuse bilateral ground-glass opacities with peribronchovascular and subpleural reticular opacities, traction bronchiectasis and no clear craniocaudal predominance. Differential considerations include sequela of acute lung injury, NSIP, or fibrotic HP given presence of air trapping. Mild subpleural reticular opacities were present on visualized portions of the lung on prior abdomen and pelvis CT dated February 02, 2021, although majority of findings are new. Findings are suggestive of an alternative diagnosis (not UIP) per consensus guidelines: Diagnosis of Idiopathic Pulmonary Fibrosis: An Official ATS/ERS/JRS/ALAT Clinical Practice Guideline. Am Annie Barton Crit Care Med Vol 198, Iss 5, [phone], Jun 21 2017.  Small solid pulmonary nodule the right middle lobe measuring 3 mm. No follow-up needed if patient is low-risk. Non-contrast chest CT can be considered in 12 months if patient is high-risk. This recommendation follows the consensus statement: Guidelines for Management of Incidental Pulmonary Nodules Detected on CT Images: From the Fleischner Society 2017; Radiology 2017; 284:228-243. Aortic Atherosclerosis (ICD10-I70.0). Electronically Signed   By: Avelino Lek [name].D.   On: 07/31/2021 12:01         08/16/2021 -  fu drug induced pneumonitis   HPI Samuel [name] 47 [name].o. - desaturated. STarted on portable o2 since yesterday and Is feeling better. On Room air say he is desaturated.  Called to discuss Bronch . He is scheduled to see Samuel PAtwardhan 08/22/21 wed at 8.30am. PRefers not to have bronch done at that that tme.  Discussed the consensus about having bronchoscopy with lavage to rule out any opportunistic infections.  At this time I took the opportunity of also recommending transbronchial biopsies.  He did have transbronchial biopsies when he was a lot more hypoxemic in August 2022 we will send for histopathology and was nondiagnostic.  This time I recommended we send it off for RNA genomic classifier for UIP it is not a sensitive test but it is specific.  If it comes back  positive then we would know that there is permanency to this and also its a marker of progression potentially.  He is willing to go through this.  Explained we under general anesthesia. Based on schedule will be myself or Samuel. Thelda Finney doing it.  Explained the following risks   Risks of pneumothorax, hemothorax, sedation/anesthesia complications such as cardiac or respiratory arrest or hypotension, stroke and bleeding all explained. Benefits of diagnosis but limitations of non-diagnosis also explained. Patient verbalized understanding and wished to proceed.    We then discussed pulse dose steroids.  Told him we will have to wait  close to a week to make sure there is fungal smears PCP and AFB smears and bacterial culture negative.  At that point we will have to take a decision on giving 1 g Solu-Medrol  for the a day for 3 days.  Ideally would need admission he does not want this.  He wants to do with is on an outpatient setting ideally.  Explained to him about the risks with steroids such as hyperglycemia, hypertension but said that I would try to work with the DME company or outpatient/home nursing to see if this would be possible.  He was appreciative.  Also discussed with Samuel. Thelda Finney who is willing to do the biopsy based on schedule of the operating room, myself and the patient if things do not work out.  Sent a secure chat to Samuel. Filiberto Hug.  Told him about the patient.  He will give us  a clearance on 08/22/2021.  Recommended also look for BNP and heart failure.  CT Chest data  No results found.   OV 08/09/2021  Subjective:  Patient ID: Samuel [name], male , DOB: 1956/08/07 , age 68 [name].o. , MRN: [id number] , ADDRESS: [address] [place] [address] PCP Rae Bugler, MD Patient Care Team: Rae Bugler, MD as PCP - General (Family Medicine)  This Provider for this visit: Treatment Team:  Attending Provider: Maire Scot, MD    08/09/2021 -   Chief Complaint  Patient presents with   Follow-up    Pt states he is about the same since last visit, maybe a little better. Pt is coughing more at times and states he does cough a lot before bed.   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 -returns for follow-up.  He is finishing up 40 mg of prednisone  per day.  Restarting 30 mg/day of prednisone  tomorrow.  He is not really better.  Today he was able to only walk 2 out of the customary 3 laps.  And he showed a tendency to desaturate.  He says he is extremely anxious.  This is understandable.  He also states  that when he lifts weights in the gym he does not have a problem but when he exerts himself he feels worse.  Previously his nocturnal desaturation test showed abnormality and we recommended night oxygen but he declined per the CMA.  But today he and his wife tell me that they would be interested in getting some oxygen if it would help with shortness of breath.  Simple walking desaturation test does not make him qualify and will need a 6-minute walk test to qualify for portable oxygen.  They are also worried about the lack of improvement.  They are wondering about second opinion.  I did unofficially check with some of my colleagues in the [place].  No clear-cut plan has been  developed.  We discussed about the possibility of visiting Samuel. Albertine Hugh, ILD group in [place] .  They seemed enthused about the idea but wanted me to reach out to Samuel. Elana Grayer first.  In terms of his myeloma review of the medical records from office visit 08/03/2021 with Samuel. Rosaline [name] and talking to the patient and the labs show the myeloma still in remission.  But Samuel. Rosaline [name] is extremely concerned that the myeloma will come back.  Oncology still wants to rechallenge him with [name]  but after my personal discussion with Samuel. Rosaline [name] on 08/02/2021 and concern for pulmonary toxicity chemotherapy is on hold till patient recovers.  He is also concerned about the presence of coronary artery calcification on his recent CT scan of the chest.  His dad had coronary artery disease diagnosed at 19 and had bypass.  He wants to visit this with Samuel. Berry Bristol again       08/31/2021 -video visit to discuss bronchoscopy results from 08/28/2021 and next step in plan    HPI Samuel [name] 36 [name].o. -his bronchoscopy with lavage 08/28/2021 shows 40% lymphocytes.  Anything greater than 30% is against UIP.  His RNA genomic classifier is in progress and that would be a specific test although not a ensitive test for UIP.  Bacterial cultures are  negative.  He continues have shortness of breath and on room air at rest he is fine but sometimes when he goes to the bathroom he can desaturate into the high 80s.  He is frustrated by his condition.  Our original plan was to ensure no opportunistic or bacterial infections [fully understanding that MTB and fungal infections can take 6 weeks] with the current bronchoscopy and to consider bronchoalveolar lavage.  And then based on this we will schedule pulsed dose steroids.    Recently his G6PD is returned is normal.  He is not on Bactrim  for prophylaxis.  Current prednisone  Is 20mg  per day.  He prefers outpatient treatment plan.  We discussed the side effects of high-dose steroids including opportunistic infection, anxiety and irritability, hypertension, diabetes, other lab abnormalities.  Explained the upset benefit of potentially improving upon current ILD active inflammatory phase.  He is willing to go through this treatment.  He prefers outpatient.  He will require lab and vital sign monitoring.   his wife wanted to know if this could be sarcoidosis.  She is read some case reports of sarcoidosis and myeloma associated.  I expressed to them the clinical features do not fit in with sarcoidosis.  However we can check for autoimmune and sarcoid features  Results for ARTICE, BERGERSON" (MRN [id number]) as of 08/31/2021 16:38 ENVISIA - NEGATIVE FOR UIP   Ref. Range 06/18/2021 12:57 08/28/2021 16:51  Monocyte-Macrophage-Serous Fluid Latest Ref Range: 50 - 90 % 5 ([name]) 10 ([name])  Other Cells, Fluid Latest Units: % CORRELATE WITH CYTOLOGY. MESOTHELIAL AND BRONCHIAL LINING CELLS  Color, Fluid Latest Ref Range: YELLOW  PINK (A) COLORLESS (A)  Total Nucleated Cell Count, Fluid Latest Ref Range: 0 - 1,000 cu mm 103 183  Fluid Type-FCT Unknown BRONCHIAL ALVEOLAR LAVAGE BRONCHIAL ALVEOLAR LAVAGE  Lymphs, Fluid Latest Units: % 18 40  Eos, Fluid Latest Units: % 51 0  Appearance, Fluid Latest Ref Range: CLEAR  HAZY (A)  CLEAR (A)  Neutrophil Count, Fluid Latest Ref Range: 0 - 25 % 26 ([name]) 50 ([name])    CT Chest data  DG CHEST PORT 1 VIEW  Result Date: 08/28/2021 CLINICAL DATA:  Status  post bronchoscopy. EXAM: PORTABLE CHEST 1 VIEW COMPARISON:  Chest radiographs 07/20/2021 and CT 07/30/2021 FINDINGS: The cardiac silhouette is borderline enlarged. Lung volumes are chronically low and slightly lower than on the prior radiographs. The interstitial markings are chronically increased diffusely. No definite acute airspace consolidation, overt pulmonary edema, sizable pleural effusion, or pneumothorax is identified. Prominent gaseous distension of the stomach is partially visualized. IMPRESSION: Low lung volumes with chronic interstitial changes. Electronically Signed   By: Aundra Lee [name].D.   On: 08/28/2021 19:10   DG C-ARM BRONCHOSCOPY  Result Date: 08/28/2021 C-ARM BRONCHOSCOPY: Fluoroscopy was utilized by the requesting physician.  No radiographic interpretation.       OV 09/25/2021  Subjective:  Patient ID: Samuel [name], male , DOB: 1956/09/13 , age 11 [name].o. , MRN: [id number] , ADDRESS: [address] [place] [address] PCP Rae Bugler, MD Patient Care Team: Rae Bugler, MD as PCP - General (Family Medicine)  This Provider for this visit: Treatment Team:  Attending Provider: Maire Scot, MD    09/25/2021 -   Chief Complaint  Patient presents with   Follow-up    Pt states that he is beginning to feel better after last visit. States he wears his O2 at 2L majority of the time.  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 Amparo Balk - 40mg  once weekly (for multiple myeloma chemotherapy N/V ppx)   04/09/21 - dexamethasone  4mg  tabs - 40  tabs for 28 day supply - Samuel. Amparo Balk - 40mg  once weekly  (for multiple myeloma chemotherapy N/V ppx)   05/07/21 - dexamethasone  4mg  tabs - 40 tabs for 28 day supply - Samuel. Amparo Balk - 40mg  once weekly  (for multiple myeloma chemotherapy N/V ppx)   06/06/21 - dexamethasone  4mg  tabs - 40 tabs for 28 day supply - Samuel. Amparo Balk - decreased to 20mg  once weekly  06/18/21 - prednisone  10mg  tabs - 104 tabs for 34 day supply - Samuel. Daren Eck ------50mg  daily x 7 days, 40 mg daily x 7 days, 30 mg daily x 7 days, 20mg  daily x 7 days, 10mg  daily x 7 days, 5mg  daily x 7 days   07/13/21 - prednisone  10mg  tabs - 296 tab for 30 day supply - Samuel. Bertrum [name] ------50mg  daily x 14 days, 40 mg daily x 14 days, 30mg  daily x 14 days, 20mg  daily thereafter  - Mid Nov 2022  - 1gm solumedrol load x 3 days as outpatient  - 09/25/2021 - 20mg  pred per day   HPI Tyriq Moragne Nack 45 [name].o. -returns for follow-up.  Since his last visit we did a loading dose of Solu-Medrol  1 g daily x3 days.  This was in mid November 2022.  After that he is gone back to daily prednisone  20 mg/day.  In the midst of the high-dose steroid he did pick up hypertension and we gave him bisoprolol  which he says has helped him significantly.  He is run out of the bisoprolol .  I have asked him to contact his primary care physician to manage his hypertension but we will give him a refill.    He is here for follow-up to see his current status.  He tells me that his effort tolerance is better.  He tells me in the gym he is able to do a little bit more work.  This is compared to a few months ago.  He does tell me that gym exercises are easier on him than climbing the stairs.  Stairs - he avoids and gets dyspneic. Not tested his pulse ox on stairs His subjective symptom profile is slightly better compared to October 2022 but his walking desaturation test is around the same.   So suspect amount of his interstitial lung disease might be better but suspect  still remains.  Review of his pulmonary function test from 10 years ago was normal.  In April 2022 he had early ILD.  He currently definitely has ILD.  His RNA genomic classifier is negative.  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  This would make him eligible for nintedanib. He continues on prednisone  20 mg/day.  In terms of his myeloma: He had his wife say that it is still under remission but they are worried about relapse.  They are worried about future direction and treatment of myeloma particularly because he has had issues with treatment that then resulted in acute lung injury.   Of note  - he is frustrated by poor customer service of our office workflows - [employer] denies his RNA genomic classifer biopsy    OV 10/19/21  [name]: call to give update on conversation with Samuel [name] his hematologist  1. Myeloma - latest dec 2022 blood work back - still under remission. Samuel [name] indicated that highly unlikely he will be a BMT Candidate for myeloma if his lung. Has appt pending with [facility]. If not a BMT candidate - then cytoxan  regimen short term would be used (indicated that is of ptioential benefit to lungs)  2. 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 Would need SLB to ID etiology preciesly but with myelom and Thereapuetic trial with steroids  and his presentation - this has not been a consideratgion till now.  Explained to Samuel [name] if ILD progresses futher - patient life expectancy is limited. Discussed with patient again and he is agreeable for prednisone  15mg  per day and starting ofev  (awaiting donor samples and insurance proces in 2023).   3. WE discussed that probably best to refer to [facility]  transplant and hematology to see if lung transplant would be an option at all if he were to decline esp in setting of myeloma. Maybe a BMT as well . Do not know answers but wil email Samuel Maryjane Snider at [facility] to get patient in for visit. He might well need a surgical lung biopsy but this can be addressed in due course  Patient and wife agreeable  I spent time emailing Samuel Marlo Simpler trasnplant doc at [facility] - later heard from Samuel Maryjane Snider- feels that Myleoma will need to be in remssion for >= 5 years before he can be considered lung transplant evaluable. They feel no need to see Mr Cuppett in transplant clinicl. I subsequetly d/w Mr Nix - will revert back to holding off lung transplant evaluation. He wil proceed with ofev . He will see [facility] BMT team. Will cosndier a [facility] ILD clinic opinion after d;w hi  2nd Pulm Opinion at [facility] - Samuel Jinx Mourning    Comment: The patient seems to have an inflammatory process that has resulted in progressive loss of lung function, worsening hypoxia. Unfortunately on the most recent imaging I do see some signs of fibrosis including a few areas of traction bronchiectasis although I do not see honeycombing or profuse traction bronchiectasis. We did discuss future therapies. I told him that unfortunately since his lung has already suffered injury I think he would be at risk for developing lung injury again and that many chemotherapeutics have been associated lung injury. I also told him these are idiosyncratic reactions and it was not possible to predict who on an individual basis would develop inflammation from chemotherapy to any specific agent. He brought up Cytoxan  I told him that while Cytoxan  issues for inflammatory lung disease on the other hand it has been associated with pulmonary inflammation. His inflammatory process is steroid responsive which is not unexpected given that eosinophils were found on BAL.  We will check oxygen assessment with exercise to  see how much oxygen as needed with more than ordinary exertion.  Plan:    - Check oxygen assessment  Will discuss with his pulmonary provider Samuel. Bertrum [name] and then Samuel. Bernetta Brilliant  Thank you for the opportunity to provide consultation for your patient. If I can be of further assistance please do not hesitate to contact my office.   [facility]   ASSESSMENT / PLAN  1. Interstitial lung disease-severe DLCO reduction (33% predicted) 2. 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 [name]  induced)-started August 2022 3. Faint/early ILD present in April 2022 (even prior to starting myeloma treatment) 4. Hypothyroidism-on replacement with Armour thyroid 5. Dyslipidemia 6. History of gout 7. Chronic allergic rhinitis and some sinusitis-on nasal steroids 8. Childhood history of asthma 9. 25 year history of allergy  shots (between ages 77 and 45)  52. Multiple myeloma-diagnosed April 2022-received about 5 cycles of [name] /[name] /dexamethasone  until August 2022    There definitely appeared to be some very early changes of ILD even back in April 2022 prior to him starting any of his myeloma treatments. However, there definitely appears to be an acute interstitial pneumonitis type picture noted on his CT scan from 06/15/2021. He was taken off all myeloma treatments at that time with concern for drug-induced pneumonitis with both [name]  as well as [name]  being considered probably agents. He subsequently underwent 1 additional cycle of myeloma treatment with [name]  alone (without [name] ) and had an acute exacerbation of dyspnea symptoms and ever since then has not received any further myeloma treatment.   I discussed with him that he has been on steroids since about August 2022 and received a steroid bolus in November 2022 with very little change overall in his lung functions. I have mentioned his serial lung function numbers in the HPI above. I do not see any significant change in  these numbers despite him receiving excellent care with Samuel. Bertrum [name] in [place].   His CT chest currently shows mostly fibrotic changes with maybe some active inflammatory component still left (although the extent of active inflammation appears to be very little as compared to the initial scan from August 2022).   I discussed with him that we have very little in terms of treatments to offer him. I do not think the addition of an IL 5 inhibitor or dupilumab would be of any benefit in this situation given that his BAL eosinophil count in November was down to 0%  on steroids along with the fact that his current CT scan does not show much for active inflammation. Samuel. Bertrum [name] had discussed Cytoxan  with him, but I think that that would be somewhat aggressive given that he is already got an active bone marrow problem with the myeloma and so I would discourage him from getting Cytoxan .  In terms of treatment options, we are really left with only 1 option which is to rechallenge him with high-dose steroids for the next 6-8 weeks and then reassess both his CT scan as well as pulmonary function studies. With this in mind, I have the following plan for him:  1. Increase oral prednisone  from his 10 mg a day that he has been currently using for the past month or so to 60 mg a day. I have outlined to taper for him on the prescription and he will see me back on the 30th of May when he is down to 50 mg a day of prednisone .   2. Continue Bactrim  PCP prophylaxis. I have refilled the prescription for him.   3. Continue oxygen supplementation  4. Await the recommendations of our myeloma colleagues in Hematology  5. We cannot refer him for lung transplantation given the active diagnosis of multiple myeloma. This was discussed with him.  Overall, I told him that we likely are dealing with end-stage interstitial lung disease with very little in terms of active inflammation and something that is reversible even with  the prednisone  challenge I have outlined. He was given a prescription for Ofev  by Samuel. Bertrum [name], but could not tolerate that due to diarrhea, nausea as well as vomiting. He has an active prescription for Esbriet , but I told him to hold off on that for the next 8 weeks while he is undergoing the prednisone  trial so that we do not have any issues with him throwing up on the prednisone  and potentially confusing the assessment of treatment response.  If we do not see any improvement in his lung function on CT scan on May 30th, we will slowly taper and discontinue the prednisone  and have him start the Esbriet .  With regards to multiple myeloma treatment going forward, I would definitely not challenge him with [name] . I am not so sure about the [name]  and if that needs to be given to him in the future, we could potentially consider that under the cover of prednisone  immunosuppression. This will need to be careful discussion between his Hematology and Pulmonary team given his severe interstitial lung disease in the lack of any wiggle room if he were to decline/have a flare of pneumonitis.  All questions were answered. The patient was satisfied with the visit. No learning barriers identified during the visit.  OV 12/04/2021  Subjective:  Patient ID: Samuel [name], male , DOB: 31-Aug-1956 , age 17 [name].o. , MRN: [id number] , ADDRESS: [address] [place] [address] PCP Rae Bugler, MD Patient Care Team: Rae Bugler, MD as PCP - General (Family Medicine)  This Provider for this visit: Treatment Team:  Attending Provider: Maire Scot, MD  History of COVID-19 in the April  2022 Undiagnosed early ILD in the April 2022 Drug-induced interstitial lung disease - progressive phenotype - Aug 2022 BAL: pulm eosinophilia 51% in the setting of multiple myeloma chemotherapy - aug 2022  - Nov 2022 - BAL 40% lympocytoss 0% eos  - declined by [facility] for transplant eval dec 2022 - needs 5 year sof myelmoma  remission    Prednisone  history: 03/05/21 -  dexamethasone  4mg  tabs - 40 tabs for 28 day supply - Samuel. Amparo Balk - 40mg  once weekly (for multiple myeloma chemotherapy N/V ppx)   04/09/21 - dexamethasone  4mg  tabs - 40 tabs for 28 day supply - Samuel. Amparo Balk - 40mg  once weekly  (for multiple myeloma chemotherapy N/V ppx)   05/07/21 - dexamethasone  4mg  tabs - 40 tabs for 28 day supply - Samuel. Amparo Balk - 40mg  once weekly  (for multiple myeloma chemotherapy N/V ppx)   06/06/21 - dexamethasone  4mg  tabs - 40 tabs for 28 day supply - Samuel. Amparo Balk - decreased to 20mg  once weekly  06/18/21 - prednisone  10mg  tabs - 104 tabs for 34 day supply - Samuel. Daren Eck ------50mg  daily x 7 days, 40 mg daily x 7 days, 30 mg daily x 7 days, 20mg  daily x 7 days, 10mg  daily x 7 days, 5mg  daily x 7 days   07/13/21 - prednisone  10mg  tabs - 296 tab for 30 day supply - Samuel. Bertrum [name] ------50mg  daily x 14 days, 40 mg daily x 14 days, 30mg  daily x 14 days, 20mg  daily thereafter  - Mid Nov 2022  - 1gm solumedrol load x 3 days as outpatient  - 09/25/2021 - 20mg  pred per day \ -Late December 2022/early January 2023: Start nintedanib - 12/04/21 -prednisone  15mg  per day      12/04/2021 -   Chief Complaint  Patient presents with   Follow-up    Pt states he stopped taking the OFEV  a week ago due to having problems with diarrhea, nausea, and some bleeding still. States since he stopped taking it, he has not had any diarrhea the past 2 days and states the bleeding stopped 2 days ago.     HPI Koltyn Kelsay Halvorsen 62 [name].o. -returns for follow-up with his wife.  He tells me that in terms of his multiple myeloma he has visited [facility] hematology department and since then is followed with Samuel. Rosaline [name].  He also saw Samuel. Lajune Piles at [facility] pulmonary.  I reviewed Samuel. Armen Bernhardt notes from earlier this month 2023.  The general feeling that I get is that they are very nervous to do any form of chemotherapy  in him for fear of exacerbating his lung disease.  He is extremely worried about this approach.  There is also trepidation about Cytoxan .  He is really worried about what to do if his myeloma came back.  On the other hand he is also worried about his lungs.  He said he did talk to her known physician call Princella Brooklyn through Zoom meeting in [place]  and he has recommended that patient use 10 [name] of oxygen with exercise.  He wants higher dose concentrator for this.  I was willing to prescribe this.  He is willing to even pay for this out-of-pocket.  He is also recommended sudden breathing exercises.  Patient is trying different breathing exercises with the hope his lungs can heal.  He is aware that he might be dealing with progressive issues.  He was on nintedanib for a month and early February 2023 he called saying he was having diarrhea and also bloody stools.  He has now stopped nintedanib and for the last week he has not had a diarrhea.  For the last few days there is no bloody stools.  He does not want to do this drug again.  The side effects were quite bad.  There is no further bleeding.  He also believes his hypertension  resolved or improved after stopping nintedanib.  He did have a colonoscopy 5 years ago and since then has not had any problems.  His next colonoscopy might be in another 5 years.  At this point in time he is able to do treadmill exercise on 5 [name] and walk 30 minutes 1.7 mph.  Nevertheless when we walked him on room air here in office he desaturated quickly.  It seems like his distance to desaturation is gotten worse.  He is worried about both the myeloma and the interstitial lung disease.  We discussed options about getting further opinions.  He says he wants to go to a place with his extreme expertise about this.  We discussed the idea about having to go out of state including [place], [place] [facility] and the [facility].  I do remember a name of Samuel. Alonzo January who is professor at [employer] who is an Database administrator in myeloma.  I did mention this name to him.  I have also written to him.  I also written to 1 Samuel. Virlinda Grimmer at the pulmonary department at the [facility].  Based on the response we will facilitate a referral.  Meanwhile I did tell him that we need to protect his lungs against fibrosis.  He wants to go down on his prednisone  to 10 mg/day because of the side effects of weight gain.  I agreed to do this provisionally.  But I also recommended antifibrotic pirfenidone .  We discussed the side effects of nausea anorexia and occasionally diarrhea.  He wants to reflect on this.  He is willing to meet with the pharmacist on this.  I made a referral.   No results found.    PFT  OV 01/24/2022  Subjective:  Patient ID: Samuel [name], male , DOB: [birth year] , age 68 [name].o. , MRN: [id number] , ADDRESS: [address] [place] [address] PCP Rae Bugler, MD Patient Care Team: Rae Bugler, MD as PCP - General (Family Medicine)  This Provider for this visit: Treatment Team:  Attending Provider: Maire Scot, MD    01/24/2022 -   Chief Complaint  Patient presents with   Follow-up    PFT performed 12/10/21. Pt recently had a referral with [facility].  Pt states he has been doing okay since last visit   Drug-induced pneumonitis follow-up interstitial lung disease  HPI Samuel [name] 77 [name].o. -[facility].  Samuel. Virlinda Grimmer also called me last week.  Decided to go with high-dose prednisone  because of some groundglass opacities.  Did not want to do concomitant pirfenidone  because of potential side effect profile.  Patient has upcoming follow-up appointment Mar 09, 2022 at [facility] with Samuel. Virlinda Grimmer.  Also saw the myeloma specialist.  In case of recurrence some alternatives have been recommended.  He tells me that he is getting more optimistic.  He understands the significance of his disease in the severely but he is doing breathing exercises remaining  optimistic.  He is trying an organic diet.  And therefore his positive state of mind is making him feel better.  He does workout on a treadmill at 2.2 mph.  He covers 1.4 miles in 40 minutes.  He uses 8 [name] of oxygen for this.   Subjective symptom assessment score is documented below in the slightly better from last visit and significantly better compared to October 2022.  Walking desaturation test is improved from 2 months ago but is similar to follow-up 2022  PFT  OV 04/09/2022  Subjective:  Patient ID: Samuel [name], male , DOB: 1956/05/01 , age 24 [name].o. , MRN: [id number] , ADDRESS: [address] [place] [address] PCP Rae Bugler, MD Patient Care Team: Rae Bugler, MD as PCP - General (Family Medicine)  This Provider for this visit: Treatment Team:  Attending Provider: Maire Scot, MD    04/09/2022 -   Chief Complaint  Patient presents with   Follow-up    Pt states he has been doing okay since last visit but states he did get covid 6/6 and then after that he has had a cough.     HPI Samuel [name] 76 [name].o. 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  He had a second visit to [name].  He saw Samuel. Karie Ou for pulmonary.  In terms of his pulmonary status things were deemed to be stable.  He had lung function studies.  His FVC is actually improved to 1.68 [name] on 03/19/2022 and his DLCO is stable around 10.3./Slightly improved.  Overall his pulmonary function test is stable/trending in the right direction.  He is lost significant amount of weight it is if his improvement could be because of that.  However he was feeling subjectively improved.  According to the [facility] notes pirfenidone  is not being recommended because he is stable but he tells me that the decision was left up to him.  Certainly they want him to preserve his lung function and lose more weight.  Today wanted to talk about  pirfenidone  but in the interim immediately after coming back March 27, 2022 he emailed me saying that he had COVID.  He believes he got it at [facility].  He has taken antiviral.  After this he is better but he still having some slightly worse dyspnea on exertion than baseline.  Some slightly more subjective use of oxygen at baseline [baseline exertional pulse ox stable/slightly worse].  More coughing and wheezing than baseline.  In terms of his prednisone  therapy for his ILD and this is being tapered currently 7.5 mg/day.  [facility] Samuel. Virlinda Grimmer has on a tapering regimen to off but in the middle of this he had increased respiratory symptoms.  Wife is also reporting more sciatica as the prednisone  is coming down.  In terms of his hematology he has been seen by [facility] multiple myeloma program and they made specific recommendations that I reviewed.  They are going to be in touch with his local hematologist oncologist Samuel. Rosaline [name] but currently under remission.  Weight loss has been emphasized.  We discussed Ozempic for weight loss.      OV 06/06/2022  Subjective:  Patient ID: Samuel [name], male , DOB: 15-Jan-1956 , age 49 [name].o. , MRN: [id number] , ADDRESS: [name] [place] [address] PCP Rae Bugler, MD Patient Care Team: Rae Bugler, MD as PCP - General (Family Medicine)  This Provider for this visit: Treatment Team:  Attending Provider: Maire Scot, MD    06/06/2022 -  incent J Beckom 34 [name].o. 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  Pt states that he has not been feeling well since getting Covid May 2023 after going to [facility].     HPI Samuel [name] 68 [name].o. -Returns for follow-up.  He presents with his wife Corbin Dess. 2 weeks ago tapered off prednisone . Same time also  at request we started Pirfenidone  he has now completed at 2 pills 3 times daily.   He is going to start 3 pills 3 times daily tomorrow.  He says that overall he is not feeling all that well.  His symptom scores of worsened.  He definitely feels more dyspneic.  He is also having worsening cough although his oxygen requirements at home are not any worse.  He is also feeling more tired.  He is having some back pain.  Had MRI and had intrathecal steroid and after that the back pain is better.  He is wondering if all the symptoms are related to coming off prednisone .  At the same time he also states his myeloma is recurring.  His urine [name] protein is spiking.  He is going to see Samuel. Rosaline [name] in September 2023 and get started on the new regimen recommended by [facility] that is potentially less toxic to the lungs     His pulmonary function test is better than December 2022 seems worse than May 2023 when he was at [facility].?  Related to prednisone  taper.  His walking desaturation test is stable.          12/04/2022 Follow up : ILD , drug-induced pneumonitis, chronic respiratory failure, asthma Patient returns for a 1 month follow-up.  Patient complains that he continues to have ongoing cough, congestion, nasal drainage, shortness of breath.  As above patient has underlying interstitial lung disease with drug-induced pneumonitis, felt secondary to medications used for treatment of his multiple myeloma.  Medications were stopped.  Patient was treated with a steroid challenge.  Patient did try Esbriet  for brief time in fall 2023 but was unable to tolerate.  Patient also had COVID 19 infection in 2022 and 2023 and felt that his cough worsened after each episode. Patient'[name] wife is also sick at home with an upper respiratory infection.  Has noticed that his cough is been worse over the last week.  Cough is very aggravating.  And affects his quality life.  Worse at night.  Patient does have a dog at home.  But says he is hypoallergenic.  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  Does not have a hot tub or basement.  No birds or chickens.  COVID-19 test and influenza test today in the office are negative.  Patient denies any hemoptysis, chest pain, orthopnea.  Does have some discolored nasal discharge as dark in the morning but clears as the day goes on.  He had similar symptoms last month and was treated with a 14-day course of antibiotics.  He was also treated in December with antibiotics and steroids.  Patient has been referred to ENT and consult is pending.  He is currently taking Advair  twice daily.  Remains on Claritin daily.   OV 12/26/2022  Subjective:  Patient ID: Samuel [name], male , DOB: 1956/10/18 , age 69 [name].o. , MRN: [id number] , ADDRESS: [address] [place] [address] PCP Rae Bugler, MD Patient Care Team: Rae Bugler, MD as PCP - General (Family Medicine)  This Provider for this visit: Treatment Team:  Attending Provider: Maire Scot, MD    12/26/2022 -   Chief Complaint  Patient presents with   Follow-up    Fatigue, cough and chest congestion today.  Has had some improvement.  Esbriet  caused dizziness, GI upset and lethargy.      Follow up : ILD , drug-induced pneumonitis, chronic respiratory failure, asthma Esbriet  stopped in  October 2023 due to inability to tolerate due to GI side effects    # IgG Kappa Multiple Myeloma 02/22/2021: bone marrow biopsy confirms the diagnosis of Multiple Myeloma with a monoclonal plasma cell population.   # History of asthma.  Mid February 2020 for mildly elevated IgE and dust mite allergy .  On RAST allergy  panel.  HPI Ashish Rossetti Orlov 19 [name].o. -returns for follow-up.  Presents with his wife Corbin Dess.  He has been having some cough and congestion.  Nurse practitioner Frances Ingles in mid February 2024 did RAST allergy  panel.  IgE is slightly high.  He has dust mite allergy .  He states he is known to have allergies.  He did have allergy  shots for 10 years leading up until the recent illness  and then that was stopped.  His wife feels that the allergy  shots did help him.  He has met with Samuel. Almeda [name] and they are discussing whether to restart the allergy  shots.  He wanted to know if there is any contraindication.  I felt that he should definitely try allergy  shots or at least Xolair.  Although I was not sure whether he should undergo skin testing again.  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  He is able to play golf although very slowly.  Walking desaturation test today was stable very slowly.  He has not had a pulmonary function test and this is scheduled in a few weeks.  I offered to postpone this given his overall subjective symptoms stability and walking desaturation test ability but he wanted to keep it and come back and see me again within the next few to several weeks.  In terms of his myeloma reviewed Samuel. Amparo Balk notes and his [name] protein is less than 0.5.  Therefore he is still on monitoring plan with plans to start [facility] protocol if myeloma were to relapse.  He is nervous about it because of the recent ILD.  We have agreed to discuss this again if the situation were to arise.     OV 01/20/2023  Subjective:  Patient ID: Samuel [name], male , DOB: 1956-08-12 , age 18 [name].o. , MRN: [id number] , ADDRESS: [address] [place] [address] PCP Rae Bugler, MD Patient Care Team: Rae Bugler, MD as PCP - General (Family Medicine)  This Provider for this visit: Treatment Team:  Attending Provider: Maire Scot, MD    01/20/2023 -   Chief Complaint  Patient presents with   Follow-up    F/up PFT     HPI Thedora Finlay Bunt 60 [name].o. -as I got ready to see him I fell ill and declined to see him. Needs to be rescheduled.        OV 06/12/2023  Subjective:  Patient ID: Samuel [name], male , DOB: 11-24-1955 , age 75 [name].o. , MRN: [id number] , ADDRESS: [address] [place] [address] PCP  Rae Bugler, MD Patient Care Team: Rae Bugler, MD as PCP - General (Family Medicine)  This Provider for this visit: Treatment Team:  Attending Provider: Maire Scot, MD     06/12/2023 -   Chief Complaint  Patient presents with   Follow-up    F/up on PFT    HPI Tad Fancher Saldivar 89 [name].o. -presents for follow-up.  His last visit with me was in March 2024.  And in the April visit had to be canceled because I was sick.  Since  then he has seen Samuel. Rosaline [name] his myeloma specialist.  At that visit he reported that he has got a new diagnosis of acid reflux and laryngoscopy showed inflammation.  He still reported that he was using 2 to 3 [name] oxygen at rest and at home with 10 [name] at exercise.  The [name] protein itself was deemed stable and continued monitoring decision was taken through a shared decision-making process.  Decision to start treatment was held off.  He also saw speech-language pathologist on 05/26/2023 in terms of his shortness of breath he feels stable.  In fact his subjective symptom scores are stable.  He says that when he does bench press he does not desaturate but for treadmill he will have to use a lot of oxygen.  However pulmonary function test shows a significant decline 12% compared to the recent 1.  He says he is not feeling it.  He says in fact he did a good cooperative PFT.  His sit/stand hypoxemia exercise test was also adequate.  In February 2024 he did not have any pulmonary hypertension.  His last CT scan was in February 2024 of the chest.  There is no lung cancer.  We discussed about getting BNP and echocardiogram as a screening for pulmonary hypertension and he is interested in this.  The intent would be to treat with inhaled treprostinil if he has pulmonary hypertension.  Meanwhile his [name] protein levels are higher 0.8.  Therapy is being considered.  I reviewed the intended immunomodulator therapy.  There is no report of any ILD but there is increase of shortness of breath and  respiratory infections.   OV 09/12/2023  Subjective:  Patient ID: Samuel [name], male , DOB: July 15, 1956 , age 58 [name].o. , MRN: [id number] , ADDRESS: [address] [place] [address] PCP Rae Bugler, MD Patient Care Team: Rae Bugler, MD as PCP - General (Family Medicine)  This Provider for this visit: Treatment Team:  Attending Provider: Maire Scot, MD    09/12/2023 -   Chief Complaint  Patient presents with   Acute Visit    SOB with exertion, cough, nasal and chest congestion, chest tightness, wheezing x 3 weeks.     HPI Samuel [name] 70 [name].o. -last seen in August 2024.  There is an acute visit.  His wife Corbin Dess is here with him.  She is worried that he is declining.  She tells me that he is no longer even able to walk up the golf course and he stopped playing golf a few weeks ago.  Patient states that he was playing 9 holes of golf and walking and talking and doing really well.  A few months ago he was drinking turmeric protein shakes and had bad acid reflux and after that started going to speech therapy and was really helping him.  Then in the last 3 weeks because of the change in season to fall with the onset of drop in fall leaves he started having increased nasal congestion, chest congestion, hacking cough constantly clearing his throat worsening dyspnea.  Pending.  A lot of shallow breaths.  Sometimes he feels the shallow breaths are from anxiety he is clearing his throat a lot.  He also feels his chest is in spasms.  He stopped playing golf a week ago.  He was only playing 3 holes last week and got short of breath.  Exertional pulse ox sometimes at home dropped to the 60s.  He had blood work 2 days ago and  other than sodium labs are normal and [name] protein is 0.7 in October with a November 1 still pending.  Reviewed speech therapy notes.  Last high-resolution CT chest February 2024 without change  Echo September 2024 with grade 1 diastolic dysfunction but good  ejection fraction.  He started a 8-day prednisone  taper today that I called in 2 days ago  Review of medication shows he is on Ventolin , aspirin , Breztri , Flonase, guaifenesin, Singulair , Prilosec and now turmeric capsules.  He is also on sildenafil.  Not on anticoagulation.           11/28/2023 Follow up ; ILD, drug-induced pneumonitis, chronic respiratory failure, asthma Patient presents for a 1 month follow-up.  Patient is followed for interstitial lung disease in the setting of drug-induced pneumonitis but disease felt secondary to multiple myeloma drug treatment) patient was seen last visit after a progressive clinical decline with decreased activity tolerance and increased oxygen requirement.  PFTs in August showed decreased lung capacity.  2D echo in September 2024 showed preserved EF and grade 1 diastolic dysfunction.  Lab work with an elevated D-dimer with subsequent CT angio was negative for PE.  Showed mild diffuse groundglass opacities slightly increased in the bases.  And stable lesions on the ribs and scapula.  He was treated for a possible asthmatic bronchitis in early December with prednisone  and a Z-Pak.  He had ongoing symptoms and was called in Levaquin .  Last visit patient did have improvement in cough and congestion however continued to have decreased activity tolerance and increased shortness of breath.  He was placed on a low prednisone  burst with prednisone  20 mg for 1 week, 10 mg for 2 weeks and then 5 mg daily. He returns today with some improvement.  Patient says he has been able to do slightly more activities and has been able to turn oxygen down to 1 [name] at rest and 3 [name] with activity.  He still uses 8 to 9 [name] with exercise on the treadmill.  A modified barium swallow that showed normal swallow.  Some mild dysmotility.  He is going to see speech therap/ENT for an evaluation.  He also has been referred to pulmonary rehab and begin this week he denies any increased cough,  congestion.  Chest x-ray last visit showed stable chronic changes.  Sputum culture with an adequate.  He has an upcoming high-resolution CT chest in March    OV 01/13/2024  Subjective:  Patient ID: Samuel [name], male , DOB: 12-25-1955 , age 23 [name].o. , MRN: [id number] , ADDRESS: [address] [place] [address] PCP Rae Bugler, MD Patient Care Team: Rae Bugler, MD as PCP - General (Family Medicine)  This Provider for this visit: Treatment Team:  Attending Provider: Maire Scot, MD    01/13/2024 -   Chief Complaint  Patient presents with   Follow-up    Sob, can't breathe, go over recent CT Scan     HPI Samuel Prevo Fluhr 49 [name].o. -presents for follow-up.  Since I last saw him several months ago he has declined.  He says since Christmas he is now desaturating even on room air at rest or with minimal exertion.  Pulse ox was 89% room air at rest.  He has been intolerant to previous antifibrotic'[name].  He is really frustrated by his decline.  He feels his nasal passages are blocked and he is not able to take a deep breath.  He has visited ENT and they have reassured him.  I did  indicate to him with his oxygen demands the nasal passage might not be wide enough to supply the air that he is demanding because of his ILD.  Nevertheless he does have some sinus drainage issues.  We did a CT scan of the chest and clearly he has progression in his ILD.  In addition he is enlarged pulmonary arteries indicated was suggestive of pulmonary hypertension.  I have recommended right heart catheterization and have reached out to Samuel. Patwrdaan by electronic medical record.  To facilitate right heart cath.  He is also dealing with significant cough with some dry and white sputum.  I recommend opioids for cough control.  He is open to this idea.  Made a prescription  In terms of his myeloma his [name] protein is down to 0.5 and he feels it is in remission.   CT Chest data from date: *12/24/23  - personally  visualized and independently interpreted : yes - my findings are: as below  IMPRESSION: 1. Pulmonary parenchymal pattern of interstitial lung disease, as detailed above, stable from 09/12/2023 but progressive from 07/30/2021. 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 Findings are indeterminate for UIP per consensus guidelines: Diagnosis of Idiopathic Pulmonary Fibrosis: An Official ATS/ERS/JRS/ALAT Clinical Practice Guideline. Am Annie Barton Crit Care Med Vol 198, Iss 5, [phone], Jun 21 2017. 2. Aortic atherosclerosis (ICD10-I70.0). Coronary artery calcification. 3. Enlarged pulmonic trunk, indicative of pulmonary arterial hypertension.     Electronically Signed   By: Shearon Denis [name].D.   On: 01/06/2024 15:48   OV 02/05/2024  Subjective:  Patient ID: Samuel [name], male , DOB: May 05, 1956 , age 58 [name].o. , MRN: [id number] , ADDRESS: [address] [place] [address] PCP Rae Bugler, MD Patient Care Team: Rae Bugler, MD as PCP - General (Family Medicine) Cody Das, MD as PCP - Cardiology (Cardiology)  This Provider for this visit: Treatment Team:  Attending Provider: Maire Scot, MD  Follow up : ILD , drug-induced pneumonitis, chronic respiratory failure, asthma Esbriet  stopped in October 2023 due to inability to tolerate due to GI side effects   Ofev  spped in 2022 -intolerant  Esbriet  stopped in October 2023 due to inability to tolerate due to GI side effects   # IgG Kappa Multiple Myeloma 02/22/2021: bone marrow biopsy confirms the diagnosis of Multiple Myeloma with a monoclonal plasma cell population.   # History of asthma.  Mid February 2020 for mildly elevated IgE and dust mite allergy .  On RAST allergy  panel.  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 -returns for  follow-up after right heart catheterization January 25, 2024.  Mean pulmonary artery pressure is 21, capillary wedge pressure is 4.  Overall he states he is doing stable.  He feels his dyspnea is better.  He recognizes that anxiety is contributing significantly to dyspnea he states he is addressing this.  In addition his cough is better because of Hycodan.  He wanted to discuss therapy.  His wife is here as well.  We discussed inhaled treprostinil.  He says he worried about the side effects of the drug.  He prefers the nebulizer as opposed to dry powdered inhaler.  We talked about my experience of 100 for patients not tolerating the drug but also works there is 53 reversible involving cough, sore throat, chest tightness, chest  burn, headache, dizziness.  These are the common ones.  After weighing the pros and cons he is decided to try the nebulizer.  He does understand his pressures 21 mmHg and he meets the definition.  This is in variance with increased study.  However I did feel it was indicated and it could be of potential benefit which is improvement in 6-minute walk distance and also shortness of breath.  There might be a moderate effect on pulmonary fibrosis itself.       OV 03/18/2024  Subjective:  Patient ID: Samuel [name], male , DOB: [birth year] , age 68 [name].o. , MRN: [id number] , ADDRESS: [address] [place] [address] PCP Rae Bugler, MD Patient Care Team: Rae Bugler, MD as PCP - General (Family Medicine) Cody Das, MD as PCP - Cardiology (Cardiology)  This Provider for this visit: Treatment Team:  Attending Provider: Maire Scot, MD    03/18/2024 -  No chief complaint on file.    HPI ATTILIO ZEITLER 66 [name].o. -     SYMPTOM SCALE - ILD 08/09/2021 09/25/2021 218# 12/04/2021 222# 01/24/22 222# 04/09/2022 215# 06/06/2022 211# 06/12/2023 205# 09/12/2023   Current weight    Treadmill 2.2 mph.  As 1.4 miles over 40 minutes.  Uses 8 [name] oxygen Post may 2nd  visit 03/27/22 -2ndcvoid     O2 use ra ra ra ra 2L eert 2L exert O2 with exertion   Shortness of Breath 0 -> 5 scale with 5 being worst (score 6 If unable to do)         At rest 0 0 0 0 0 4 0.5 2  Simple tasks - showers, clothes change, eating, shaving 2 2 1  0/5 0 3 2 3   Household (dishes, doing bed, laundry) x na 1 1 1 3 3 4   Shopping 3 1 1.5 0/5 1 3  0.5 3  Walking level at own pace 4 2 2 1 1 2  2.5 4  Walking up Stairs 5 4 3  3.5 3 2 5 5   Total (30-36) Dyspnea Score 14 9 8.5 6 6 17  14.5 21  How bad is your cough? 3 1 0.5  3.4 5 Only iwht acid reflux 4  How bad is your fatigue 0 2 0  3   3  How bad is nausea 0 0 0  0 5 0 1  How bad is vomiting?  0 0 0  0 0 0 0  How bad is diarrhea? 0 0 0  0 4 0 1  How bad is anxiety? 5 2 1   0.5 0 6 3  How bad is depression 2 1 1   0.5 0 2 3  Any chronic pain - if so where and how bad x x x  x  x 0     Simple office walk 185 feet x  3 laps goal with forehead probe 07/12/2021  08/09/2021  09/25/2021  12/04/2021  01/24/2022  04/09/2022  06/06/2022  12/26/2022  06/12/2023 09/12/2023   O2 used ra Ra3 ra ra ra ra ra ra ra  Number laps completed 3 3 but did oly 2 3 3  attempted but stopped at 2 due to deats All 3 las Stopped at [employer] all 3 Stopped at 2 1 lap and even that in half away  Comments about pace slow slow slow slow Avg pace Avg  slow slow   Resting Pulse Ox/HR 100% and 69/min 100%ad 74 98% and 75 99% RA and59 98% and HR  60 98% and HR 54 98% nand HR 90 98% n HR 73 100% and HR 64  Final Pulse Ox/HR 92% and 81/min 93% and 87 91% and 91 86% RA and 85 91% and HR 80 (brief 89%) 88% and HR 80 91% and HR 107 90% an HR 79 92% and HR 67 - half lap  Desaturated </= 88% no no no yes ni  no    Desaturated <= 3% points Yes 8 Yes, 7 pponts Yes, 7 points Yes, 13 poins Yes 7 points Yes, 10 points Yes 7 poin    Got Tachycardic >/= 90/min no no yes no no  uyes    Symptoms at end of test No complaints Mod-severe dyspnea Mild dyspnea No complaints none Seemed ok  Mild dyspnea    Miscellaneous comments x Worse? stable ? worse     Reduced efforr tolerance    CT Chest data from date: ****  - personally visualized and independently interpreted : *** - my findings are: ***   PFT     Latest Ref Rng & Units 06/12/2023   11:30 AM 01/17/2023    2:09 PM 09/26/2022   10:34 AM 08/01/2022    1:57 PM 06/06/2022    1:52 PM 12/10/2021    9:24 AM 10/16/2021    3:56 PM  PFT Results  FVC-Pre [name] 1.19  1.36  1.32  1.55  1.43  1.49  1.35   FVC-Predicted Pre % 26  29  28   33  30  32  29   Pre FEV1/FVC % % 88  90  85  94  88  94  92   FEV1-Pre [name] 1.05  1.23  1.12  1.46  1.26  1.40  1.24   FEV1-Predicted Pre % 30  36  32  42  36  40  36   DLCO uncorrected ml/min/mmHg 9.36  12.92  9.61  10.43  13.19  8.64  9.99   DLCO UNC% % 35  48  36  39  49  32  37   DLCO corrected ml/min/mmHg 9.36  13.27  9.96  10.58  13.77  9.20  10.04   DLCO COR %Predicted % 35  49  37  39  51  34  37   DLVA Predicted % 126  127  107  110  128  97  109        LAB RESULTS last 96 hours No results found.       has a past medical history of Asthma, GERD (gastroesophageal reflux disease), High cholesterol, History of blood transfusion, Hypothyroidism, Interstitial lung disease (HCC), Multiple myeloma (HCC), Perennial allergic rhinitis, Pneumonia, Sciatic pain, right, Seasonal allergic rhinitis, and Thyroid disease.   reports that he quit smoking about 41 years ago. His smoking use included cigarettes. He started smoking about 44 years ago. He has a 0.3 pack-year smoking history. He has never used smokeless tobacco.  Past Surgical History:  Procedure Laterality Date   BRONCHIAL BIOPSY  06/18/2021   Procedure: BRONCHIAL BIOPSIES;  Surgeon: Denson Flake, MD;  Location: [facility];  Service: Cardiopulmonary;;   BRONCHIAL BIOPSY  08/28/2021   Procedure: BRONCHIAL BIOPSIES;  Surgeon: Prudy Brownie, DO;  Location: [facility];  Service: Cardiopulmonary;;   BRONCHIAL WASHINGS  06/18/2021    Procedure: BRONCHIAL WASHINGS;  Surgeon: Denson Flake, MD;  Location: [facility];  Service: Cardiopulmonary;;   BRONCHIAL WASHINGS  08/28/2021   Procedure: BRONCHIAL WASHINGS;  Surgeon: Prudy Brownie, DO;  Location: [facility];  Service: Cardiopulmonary;;   NASAL SINUS SURGERY     NECK SURGERY  1996   RIGHT HEART CATH N/A 01/23/2024   Procedure: RIGHT HEART CATH;  Surgeon: Cody Das, MD;  Location: [facility];  Service: Cardiovascular;  Laterality: N/A;   VIDEO BRONCHOSCOPY N/A 06/18/2021   Procedure: VIDEO BRONCHOSCOPY WITH FLUORO;  Surgeon: Denson Flake, MD;  Location: [facility];  Service: Cardiopulmonary;  Laterality: N/A;   VIDEO BRONCHOSCOPY N/A 08/28/2021   Procedure: VIDEO BRONCHOSCOPY WITH FLUORO;  Surgeon: Prudy Brownie, DO;  Location: [facility];  Service: Cardiopulmonary;  Laterality: N/A;    Allergies  Allergen Reactions   Clarithromycin Other (See Comments)    Hiccups   Ofev  [Nintedanib] Other (See Comments)    GI bleeding   Penicillins Other (See Comments)    Child hood unsure    Immunization History  Administered Date([name]) Administered   Fluad Quad(high Dose 65+) 08/09/2021, 08/01/2022, 07/22/2023   Fluzone Influenza virus vaccine,trivalent (IIV3), split virus 10/27/2017   Influenza, High Dose Seasonal PF 12/01/2017, 11/29/2019, 12/19/2020, 12/24/2021   Influenza, Quadrivalent, Recombinant, Inj, Pf 07/28/2019, 08/11/2020   Influenza-Unspecified 11/26/2011, 07/21/2017   PFIZER(Purple Top)SARS-COV-2 Vaccination 12/27/2019, 01/24/2020, 09/21/2020   Tdap 08/20/2005, 09/07/2015   Zoster, Live 11/06/2017, 05/07/2018    Family History  Problem Relation Age of Onset   Hypertension Mother    Allergies Mother    Allergies Father    CAD Father 59   Breast cancer Paternal Grandmother    Hypertension Other    Heart disease Other      Current Outpatient Medications:    acyclovir  (ZOVIRAX ) 400 MG tablet, Take 1 tablet (400 mg total)  by mouth 2 (two) times daily. (Patient taking differently: Take 400 mg by mouth daily.), Disp: 60 tablet, Rfl: 3   albuterol  (PROVENTIL ) (2.5 MG/3ML) 0.083% nebulizer solution, USE ONE VIAL IN NEBULIZER EVERY 6 HOURS AS NEEDED WHEEZING OR SHORTNESS OF BREATH, Disp: 120 mL, Rfl: 12   albuterol  (VENTOLIN  HFA) 108 (90 Base) MCG/ACT inhaler, Inhale 2 puffs into the lungs every 6 hours as needed for wheezing or shortness of breath., Disp: 8.5 g, Rfl: 1   ALPRAZolam  (XANAX ) 0.5 MG tablet, Take 0.5 mg by mouth at bedtime., Disp: , Rfl:    amLODipine (NORVASC) 5 MG tablet, Take 1 tablet (5 mg total) by mouth daily., Disp: 90 tablet, Rfl: 3   ASSESS FULL RANGE PEAK METER DEVI, as directed., Disp: , Rfl:    azelastine  (ASTELIN ) 0.1 % nasal spray, Place 2 sprays into both nostrils 2 (two) times daily. Use in each nostril as directed (Patient taking differently: Place 2 sprays into both nostrils 2 (two) times daily as needed for allergies or rhinitis. Use in each nostril as directed), Disp: 30 mL, Rfl: 12   bisoprolol  (ZEBETA ) 5 MG tablet, Take 1 tablet (5 mg total) by mouth daily., Disp: 30 tablet, Rfl: 2   Budeson-Glycopyrrol-Formoterol (BREZTRI  AEROSPHERE) 160-9-4.8 MCG/ACT AERO, Inhale 2 puffs into the lungs in the morning and at bedtime., Disp: 10.7 g, Rfl: 11   cetirizine  (ZYRTEC ) 10 MG tablet, Take 1 tablet (10 mg total) by mouth daily., Disp: 30 tablet, Rfl: 11   cholecalciferol (VITAMIN D) 1000 UNITS tablet, Take 3,000 Units by mouth daily., Disp: , Rfl:    dextromethorphan (DELSYM) 30 MG/5ML liquid, Take 30 mg by mouth as needed for cough., Disp: , Rfl:    EPINEPHrine  (EPI-PEN) 0.3 mg/0.3 mL DEVI, Inject 0.3 mg into the muscle as  needed., Disp: , Rfl:    ezetimibe -simvastatin  (VYTORIN ) 10-40 MG tablet, Take 1 tablet by mouth daily., Disp: 90 tablet, Rfl: 3   fluticasone  (FLONASE) 50 MCG/ACT nasal spray, Place 1 spray into both nostrils daily as needed for allergies or rhinitis., Disp: , Rfl:     HYDROcodone  bit-homatropine (HYCODAN) 5-1.5 MG/5ML syrup, Take 5 mLs by mouth every 6 (six) hours as needed for cough., Disp: 240 mL, Rfl: 0   ibuprofen (ADVIL) 200 MG tablet, Take 200 mg by mouth every 6 (six) hours as needed for moderate pain (pain score 4-6)., Disp: , Rfl:    ketoconazole (NIZORAL) 2 % cream, Apply 1 Application topically 2 (two) times daily as needed for irritation., Disp: , Rfl:    levothyroxine  (SYNTHROID ) 50 MCG tablet, Take 50 mcg by mouth every morning., Disp: , Rfl:    liothyronine (CYTOMEL) 5 MCG tablet, Take 5 mcg by mouth daily., Disp: , Rfl:    montelukast  (SINGULAIR ) 10 MG tablet, Take 10 mg by mouth at bedtime., Disp: , Rfl:    Multiple Vitamin (MULTIVITAMIN) tablet, Take 1 tablet by mouth daily., Disp: , Rfl:    omeprazole  (PRILOSEC) 40 MG capsule, Take 1 capsule (40 mg total) by mouth 2 (two) times daily. Take 30 minutes before a meal, Disp: 60 capsule, Rfl: 3   OXYGEN, Inhale 2 [name] into the lungs continuous., Disp: , Rfl:    predniSONE  (DELTASONE ) 5 MG tablet, Daily for 3 weeks and then every other day (Patient taking differently: Take 5 mg by mouth daily with breakfast. Daily for 3 weeks and then every other day), Disp: 30 tablet, Rfl: 1   sildenafil (REVATIO) 20 MG tablet, Take 20 mg by mouth daily as needed (ED)., Disp: , Rfl:    Simethicone  (SIMETHICONE  ULTRA STRENGTH) 180 MG CAPS, Take 1 capsule (180 mg total) by mouth 3 (three) times daily as needed., Disp: 90 capsule, Rfl: 0   sodium chloride  (OCEAN) 0.65 % SOLN nasal spray, Place 1 spray into both nostrils as needed for congestion., Disp: , Rfl:    tamsulosin  (FLOMAX ) 0.4 MG CAPS capsule, Take 0.4 mg by mouth daily., Disp: , Rfl:    triamcinolone  cream (KENALOG ) 0.1 %, Apply 1 application topically daily as needed (sun burn itch)., Disp: , Rfl:    Turmeric 400 MG CAPS, Take 1 capsule by mouth daily., Disp: , Rfl:       Objective:   There were no vitals filed for this visit.  Estimated body mass index  is 29.03 kg/[name] as calculated from the following:   Height as of 02/05/24: 5\' 10"  (1.778 [name]).   Weight as of 02/05/24: 202 lb 4.8 oz (91.8 kg).  @WEIGHTCHANGE @  There were no vitals filed for this visit.   Physical Exam   General: No distress. *** O2 at rest: *** Cane present: *** Sitting in wheel chair: *** Frail: *** Obese: *** Neuro: Alert and Oriented x 3. GCS 15. Speech normal Psych: Pleasant Resp:  Barrel Chest - ***.  Wheeze - ***, Crackles - ***, No overt respiratory distress CVS: Normal heart sounds. Murmurs - *** Ext: Stigmata of Connective Tissue Disease - *** HEENT: Normal upper airway. PEERL +. No post nasal drip        Assessment:     No diagnosis found.     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 Mean pressure  Plan  -start [name] NEBULIZER per protocol  - await insurance approval  ILD (interstitial lung disease) (HCC) Drug-induced pneumonitis  =- Fibrosis is worse;  - intolerance to anti fibrotics  Plan - supportive care  Chronic respiratory failure  Plan  - continue o2 as before  Myeloma    [name] protein < 0.8 in July 2024 and 0.5 in March 2025 and 0.6 in April 2025  Plan  - per [facility] and Samuel [name]  Anxiety  - affecting quality of life but glad you are now talking about it and addressing it  Plan  - meditation + talk t PCP  Chronic Cough Cough and congestion/ASthma/SEasonal allergies =  - RAST panel positive for mild IgE elevation and dust mite allerg  -Continue Hycodan cough syrup 5 mg 4 times daily as needed  Folowup May 2025 previiusly scheduled   FOLLOWUP No follow-ups on file.    SIGNATURE    Samuel. Maire Scot, [name].D., F.C.C.P,  Pulmonary and Critical Care Medicine Staff Physician, [facility] Director - Interstitial Lung Disease  Program  Pulmonary Fibrosis [facility] at [facility] [place], [place], [address]  Pager: [phone], If no answer or between  15:00h - 7:00h: call [phone] Telephone: [phone]  6:29 PM 03/18/2024   Moderate Complexity MDM OFFICE  2021 E/[name] guidelines, first released in 2021, with minor revisions added in 2023 and 2024 Must meet the requirements for 2 out of 3 dimensions to qualify.    Number and complexity of problems addressed Amount and/or complexity of data reviewed Risk of complications and/or morbidity  One or more chronic illness with mild exacerbation, OR progression, OR  side effects of treatment  Two or more stable chronic illnesses  One undiagnosed new problem with uncertain prognosis  One acute illness with systemic symptoms   One Acute complicated injury Must meet the requirements for 1 of 3 of the categories)  Category 1: Tests and documents, historian  Any combination of 3 of the following:  Assessment requiring an independent historian  Review of prior external note([name]) from each unique source  Review of results of each unique test  Ordering of each unique test    Category 2: Interpretation of tests   Independent interpretation of a test performed by another physician/other qualified health care professional (not separately reported)  Category 3: Discuss management/tests  Discussion of management or test interpretation with external physician/other qualified health care professional/appropriate source (not separately reported) Moderate risk of morbidity from additional diagnostic testing or treatment Examples only:  Prescription drug management  Decision regarding minor surgery with identfied patient or procedure risk factors  Decision regarding elective major surgery without identified patient or procedure risk factors  Diagnosis or treatment significantly limited by social determinants of health             HIGh Complexity  OFFICE   2021 E/[name] guidelines, first released in 2021, with minor revisions added in  2023. Must meet the requirements for 2 out of 3 dimensions to qualify.    Number and complexity of problems addressed Amount and/or complexity of data reviewed Risk of complications and/or morbidity  Severe exacerbation of chronic illness  Acute or chronic illnesses that may pose a threat to life or bodily function, e.g., multiple trauma, acute MI, pulmonary embolus, severe respiratory distress, progressive rheumatoid arthritis, psychiatric illness with potential threat to self or others, peritonitis, acute renal failure, abrupt change in neurological status Must meet the requirements for 2 of 3 of the categories)  Category 1:  Tests and documents, historian  Any combination of 3 of the following:  Assessment requiring an independent historian  Review of prior external note([name]) from each unique source  Review of results of each unique test  Ordering of each unique test    Category 2: Interpretation of tests    Independent interpretation of a test performed by another physician/other qualified health care professional (not separately reported)  Category 3: Discuss management/tests  Discussion of management or test interpretation with external physician/other qualified health care professional/appropriate source (not separately reported)  HIGH risk of morbidity from additional diagnostic testing or treatment Examples only:  Drug therapy requiring intensive monitoring for toxicity  Decision for elective major surgery with identified pateint or procedure risk factors  Decision regarding hospitalization or escalation of level of care  Decision for DNR or to de-escalate care   Parenteral controlled  substances            LEGEND - Independent interpretation involves the interpretation of a test for which there is a CPT code, and an interpretation or report is customary. When a review and interpretation of a test is performed and documented by the provider, but not  separately reported (billed), then this would represent an independent interpretation. This report does not need to conform to the usual standards of a complete report of the test. This does not include interpretation of tests that do not have formal reports such as a complete blood count with differential and blood cultures. Examples would include reviewing a chest radiograph and documenting in the medical record an interpretation, but not separately reporting (billing) the interpretation of the chest radiograph.   An appropriate source includes professionals who are not health care professionals but may be involved in the management of the patient, such as a Clinical research associate, upper officer, case manager or teacher, and does not include discussion with family or informal caregivers.    - SDOH: SDOH are the conditions in the environments where people are born, live, learn, work, play, worship, and age that affect a wide range of health, functioning, and quality-of-life outcomes and risks. (e.g., housing, food insecurity, transportation, etc.). SDOH-related Z codes ranging from Z55-Z65 are the ICD-10-CM diagnosis codes used to document SDOH data Z55 - Problems related to education and literacy Z56 - Problems related to employment and unemployment Z57 - Occupational exposure to risk factors Z58 - Problems related to physical environment Z59 - Problems related to housing and economic circumstances [phone] - Problems related to social environment [phone] - Problems related to upbringing [phone] - Other problems related to primary support group, including family circumstances Z90 - Problems related to certain psychosocial circumstances Z65 - Problems related to other psychosocial circumstances

## 2024-03-18 NOTE — Telephone Encounter (Signed)
 Recommend adding amlodipine  5 mg daily for hypertension. Continue bisoprolol .  Thanks MJP

## 2024-03-18 NOTE — Telephone Encounter (Signed)
 Called pt, relayed message. Medication sent int pt's preferred pharmacy.

## 2024-03-18 NOTE — Telephone Encounter (Signed)
 Patient identification verified by 2 forms.   Called and spoke to patient  Patient states:  -Current blood pressure: 151/93 HR 67  Patient denies:  -Symptoms currently            Interventions/Plan: -Have message reviewed by provider.   Reviewed ED warning signs/precautions  Patient agrees with plan, no questions at this time

## 2024-03-19 ENCOUNTER — Ambulatory Visit (INDEPENDENT_AMBULATORY_CARE_PROVIDER_SITE_OTHER): Admitting: Internal Medicine

## 2024-03-19 ENCOUNTER — Encounter: Payer: Self-pay | Admitting: Internal Medicine

## 2024-03-19 VITALS — BP 130/74 | HR 62 | Ht 70.0 in | Wt 196.6 lb

## 2024-03-19 DIAGNOSIS — I2723 Pulmonary hypertension due to lung diseases and hypoxia: Secondary | ICD-10-CM

## 2024-03-19 DIAGNOSIS — J9611 Chronic respiratory failure with hypoxia: Secondary | ICD-10-CM | POA: Diagnosis not present

## 2024-03-19 DIAGNOSIS — J849 Interstitial pulmonary disease, unspecified: Secondary | ICD-10-CM

## 2024-03-19 DIAGNOSIS — R053 Chronic cough: Secondary | ICD-10-CM

## 2024-03-19 DIAGNOSIS — J984 Other disorders of lung: Secondary | ICD-10-CM | POA: Diagnosis not present

## 2024-03-22 ENCOUNTER — Telehealth: Payer: Self-pay

## 2024-03-22 ENCOUNTER — Other Ambulatory Visit: Payer: Self-pay

## 2024-03-22 ENCOUNTER — Ambulatory Visit (INDEPENDENT_AMBULATORY_CARE_PROVIDER_SITE_OTHER): Admitting: Behavioral Health

## 2024-03-22 DIAGNOSIS — F419 Anxiety disorder, unspecified: Secondary | ICD-10-CM

## 2024-03-22 MED ORDER — AMLODIPINE BESYLATE 5 MG PO TABS: 5.0000 mg | ORAL_TABLET | Freq: Every day | ORAL | 3 refills | Status: DC

## 2024-03-22 NOTE — Progress Notes (Signed)
 [facility] Counselor/Therapist Progress Note  Patient ID: Samuel [name], MRN: [id number],    Date: 03/22/2024  Time Spent: 50 min Caregility video; Pt & Wife are in private & Provider working remotely from [employer]. Pt & Wife are aware of the risks/limitations of telehealth & consent to Tx today.  Time In: 3:00pm Time Out: 3:50pm  Treatment Type: Cpl Th  Reported Symptoms: Elevated anx/dep & stress due to health status changes for Pt  Mental Status Exam: Appearance:  Casual     Behavior: Appropriate and Sharing  Motor: Normal  Speech/Language:  Clear and Coherent  Affect: Appropriate  Mood: normal  Thought process: normal  Thought content:   WNL  Sensory/Perceptual disturbances:   WNL  Orientation: oriented to person, place, time/date, and situation  Attention: Good  Concentration: Good  Memory: WNL  Fund of knowledge:  Good  Insight:   Good  Judgment:  Good  Impulse Control: Good   Risk Assessment: Danger to Self:  No Self-injurious Behavior: No Danger to Others: No Duty to Warn:no Physical Aggression / Violence:No  Access to Firearms a concern: No  Gang Involvement:No   Subjective: Pt & Wife express today the limitations of his health status changes in the pulmonary area. It has restricted them this year & they are both trying to deal w/the changes. He relates today how difficult life has become & how he is facing his own mortality & all that means currently.   Interventions: Family Systems and Cpl Th  Diagnosis:Anxiety  Plan: Josiah Nigh & Corbin Dess will reach out to supportive others in their life as they traverse this arena of their marriage. Josiah Nigh will record in a Notebook his ideas about mortality that weigh on him. Clinician will explore these w/him. Corbin Dess will also explore this tgthr w/us .  Target Date: 04/19/2024  Progress: 6  Frequency: Once every 2-3 wks  Modality: Cpl Th  Elroy Hallmark, LMFT

## 2024-03-22 NOTE — Telephone Encounter (Signed)
 Copied from CRM [id number]. Topic: Clinical - Medical Advice >> Mar 16, 2024  3:55 PM [name] wrote: Reason for CRM: Doretha Ganja RN for [facility] called to ask for Dr. Bertrum Brodie and his nurse's opinion on the pt's care going forward. She stated the patient recently started [name] and was having hypertension. Pt stopped using it and still is having issues with hypertension. Tyra Galley is asking what the patient should do going forward concerning the hypertension. Please call her back at [phone] ok to leave a vm.  Please advise Dr. Bertrum Brodie

## 2024-03-22 NOTE — Progress Notes (Signed)
   Deneise Lever, LMFT

## 2024-03-23 NOTE — Telephone Encounter (Signed)
 Pharmacy team does not have these forms

## 2024-03-23 NOTE — Telephone Encounter (Signed)
 Forms were placed in pharmacy team box yesterday.

## 2024-03-23 NOTE — Telephone Encounter (Signed)
 Copied from CRM [id number]. Topic: General - Other >> Mar 19, 2024  2:39 PM Margarette Shawl wrote: Reason for CRM:   Chloe, with [facility], calling to verify the Plan of Care request was received. Advised the forms were received and forwarded to [facility] to sign on 05/28. Requesting forms be returned to [facility] as soon as possible.  Routing to [place] to follow-up on this.

## 2024-03-23 NOTE — Telephone Encounter (Signed)
 Sammi Crick- will you please advise if MR has signed this yet?

## 2024-03-23 NOTE — Telephone Encounter (Signed)
 Pharmacy team, have you received this form?

## 2024-03-24 ENCOUNTER — Inpatient Hospital Stay: Attending: Hematology and Oncology

## 2024-03-24 DIAGNOSIS — C9000 Multiple myeloma not having achieved remission: Secondary | ICD-10-CM | POA: Insufficient documentation

## 2024-03-24 LAB — CBC WITH DIFFERENTIAL (CANCER CENTER ONLY)
Abs Immature Granulocytes: 0.02 10*3/uL (ref 0.00–0.07)
Basophils Absolute: 0 10*3/uL (ref 0.0–0.1)
Basophils Relative: 0 %
Eosinophils Absolute: 0.7 10*3/uL — ABNORMAL HIGH (ref 0.0–0.5)
Eosinophils Relative: 11 %
HCT: 39.6 % (ref 39.0–52.0)
Hemoglobin: 13.5 g/dL (ref 13.0–17.0)
Immature Granulocytes: 0 %
Lymphocytes Relative: 14 %
Lymphs Abs: 0.9 10*3/uL (ref 0.7–4.0)
MCH: 30.5 pg (ref 26.0–34.0)
MCHC: 34.1 g/dL (ref 30.0–36.0)
MCV: 89.6 fL (ref 80.0–100.0)
Monocytes Absolute: 0.3 10*3/uL (ref 0.1–1.0)
Monocytes Relative: 5 %
Neutro Abs: 4.5 10*3/uL (ref 1.7–7.7)
Neutrophils Relative %: 70 %
Platelet Count: 222 10*3/uL (ref 150–400)
RBC: 4.42 MIL/uL (ref 4.22–5.81)
RDW: 12.7 % (ref 11.5–15.5)
WBC Count: 6.5 10*3/uL (ref 4.0–10.5)
nRBC: 0 % (ref 0.0–0.2)

## 2024-03-24 LAB — CMP (CANCER CENTER ONLY)
ALT: 20 U/L (ref 0–44)
AST: 27 U/L (ref 15–41)
Albumin: 4.1 g/dL (ref 3.5–5.0)
Alkaline Phosphatase: 51 U/L (ref 38–126)
Anion gap: 6 (ref 5–15)
BUN: 16 mg/dL (ref 8–23)
CO2: 30 mmol/L (ref 22–32)
Calcium: 9.2 mg/dL (ref 8.9–10.3)
Chloride: 97 mmol/L — ABNORMAL LOW (ref 98–111)
Creatinine: 0.72 mg/dL (ref 0.61–1.24)
GFR, Estimated: >60 mL/min (ref >60)
Glucose, Bld: 132 mg/dL — ABNORMAL HIGH (ref 70–99)
Potassium: 4.1 mmol/L (ref 3.5–5.1)
Sodium: 133 mmol/L — ABNORMAL LOW (ref 135–145)
Total Bilirubin: 0.4 mg/dL (ref 0.0–1.2)
Total Protein: 8.3 g/dL — ABNORMAL HIGH (ref 6.5–8.1)

## 2024-03-24 LAB — LACTATE DEHYDROGENASE: LDH: 231 U/L — ABNORMAL HIGH (ref 98–192)

## 2024-03-24 NOTE — Telephone Encounter (Signed)
 Pharmacy team did not have these forms in mailbox on Monday. Called [facility] to have forms refaxed directly to pharmacy team  Geraldene Kleine, PharmD, MPH, BCPS, CPP Clinical Pharmacist (Rheumatology and Pulmonology)

## 2024-03-24 NOTE — Telephone Encounter (Signed)
 Regarding hypertension, recommend patient follow-up with cardiology for BP optimization. [name] is more likely to cause hypotension not hypertension. Left VM for Tyra Galley, RN regarding this.  Geraldene Kleine, PharmD, MPH, BCPS, CPP Clinical Pharmacist (Rheumatology and Pulmonology)

## 2024-03-25 LAB — KAPPA/LAMBDA LIGHT CHAINS
Kappa free light chain: 43.4 mg/L — ABNORMAL HIGH (ref 3.3–19.4)
Kappa, lambda light chain ratio: 2.35 — ABNORMAL HIGH (ref 0.26–1.65)
Lambda free light chains: 18.5 mg/L (ref 5.7–26.3)

## 2024-03-26 ENCOUNTER — Telehealth: Payer: Self-pay | Admitting: Internal Medicine

## 2024-03-26 LAB — MULTIPLE MYELOMA PANEL, SERUM
Albumin SerPl Elph-Mcnc: 3.4 g/dL (ref 2.9–4.4)
Albumin/Glob SerPl: 0.9 (ref 0.7–1.7)
Alpha 1: 0.3 g/dL (ref 0.0–0.4)
Alpha2 Glob SerPl Elph-Mcnc: 0.8 g/dL (ref 0.4–1.0)
B-Globulin SerPl Elph-Mcnc: 1 g/dL (ref 0.7–1.3)
Gamma Glob SerPl Elph-Mcnc: 2 g/dL — ABNORMAL HIGH (ref 0.4–1.8)
Globulin, Total: 4.1 g/dL — ABNORMAL HIGH (ref 2.2–3.9)
IgA: 353 mg/dL (ref 61–437)
IgG (Immunoglobin G), Serum: 2213 mg/dL — ABNORMAL HIGH (ref 603–1613)
IgM (Immunoglobulin M), Srm: 126 mg/dL (ref 20–172)
Total Protein ELP: 7.5 g/dL (ref 6.0–8.5)

## 2024-03-26 NOTE — Telephone Encounter (Signed)
 Rc'd fax from [facility] req signature on all 3 pages. Will fwd to Dr. Ardeth Beckers to sign.

## 2024-03-30 ENCOUNTER — Telehealth: Payer: Self-pay | Admitting: Cardiology

## 2024-03-30 NOTE — Telephone Encounter (Signed)
 Pt c/o medication issue:  1. Name of Medication: Amlodipine   2. How are you currently taking this medication (dosage and times per day)?   3. Are you having a reaction (difficulty breathing--STAT)?   4. What is your medication issue?  Feet and ankles  are swelling and very painful

## 2024-03-30 NOTE — Telephone Encounter (Signed)
 Spoke to patient's wife Dr.Patwardhan's advice given.She will call back in 1 week to report patient's symptoms and B/P readings.

## 2024-03-30 NOTE — Telephone Encounter (Signed)
 Recommend stopping amlodipine  for a week and see if symptoms improve.  Monitor blood pressure while off amlodipine .  If blood pressure is elevated, may need alternate blood pressure medication.  Thanks MJP

## 2024-03-30 NOTE — Telephone Encounter (Signed)
 Spoke to patient's wife she stated husband has been having swelling in both feet and ankles since he started Amlodipine  5 mg.Stated now he is having pain and swelling in both feet and ankles.Advised I will send message to Dr.Patwardhan for advice.

## 2024-03-31 NOTE — Telephone Encounter (Signed)
 Signed plan of care forms received from [facility]. Faxed t [phone]

## 2024-04-05 ENCOUNTER — Other Ambulatory Visit: Payer: Self-pay | Admitting: Hematology and Oncology

## 2024-04-05 ENCOUNTER — Telehealth: Payer: Self-pay

## 2024-04-05 NOTE — Telephone Encounter (Signed)
    Ok -please let him know that I understand that he has not been able to handle the [name] and we will stop it  Copying pharmacy here to see if you have any donor samples of pirfenidone /Esbriet    Also please check with patient if he has any previous samples of Esbriet

## 2024-04-05 NOTE — Telephone Encounter (Signed)
 Copied from CRM [id number]. Topic: Clinical - Prescription Issue >> Apr 01, 2024  5:40 PM Alverda Joe S wrote: Reason for CRM: patient would like to talk about his tyvaso dpi prescription, he wants to stop it because it does not make him feel good, please advise  Spoke with patient regarding prior message. Patient  stated he had stopped his [name].Patient stated he didn't feel good on it and would like to have a shot of Esbreit.  MR can you please advise   Thank you

## 2024-04-07 ENCOUNTER — Telehealth: Payer: Self-pay | Admitting: Pharmacist

## 2024-04-07 ENCOUNTER — Other Ambulatory Visit: Payer: Self-pay | Admitting: *Deleted

## 2024-04-07 ENCOUNTER — Other Ambulatory Visit: Payer: Self-pay | Admitting: Pharmacist

## 2024-04-07 ENCOUNTER — Other Ambulatory Visit (HOSPITAL_COMMUNITY): Payer: Self-pay

## 2024-04-07 DIAGNOSIS — C9000 Multiple myeloma not having achieved remission: Secondary | ICD-10-CM

## 2024-04-07 NOTE — Telephone Encounter (Signed)
 Pharmacy team to start Aretha Beers BIV  Geraldene Kleine, PharmD, MPH, BCPS, CPP Clinical Pharmacist (Rheumatology and Pulmonology)

## 2024-04-07 NOTE — Telephone Encounter (Signed)
 I called and spoke with the pt and notified of response per MR  Pt verbalized understanding  He states that he does have some samples os Esbriet   He said that he was not sure if we were going to try that again bc he got an email from MR last night that talks about another new med (I believe he is talking about the [place])  MR- please advise how to proceed

## 2024-04-07 NOTE — Telephone Encounter (Signed)
 Esbreit and [place] work differently . Right now he can rechallenge with esbreit. When we get yuptreia he cn add it on. We can play by ear

## 2024-04-07 NOTE — Telephone Encounter (Signed)
 Patient will be [place] new start  Submitted a Prior Authorization request to [employer] for YUTREPIA via CoverMyMeds. Will update once we receive a response.  Key: [id number]

## 2024-04-08 ENCOUNTER — Other Ambulatory Visit (HOSPITAL_COMMUNITY): Payer: Self-pay

## 2024-04-08 ENCOUNTER — Telehealth: Payer: Self-pay

## 2024-04-08 ENCOUNTER — Ambulatory Visit: Admitting: Behavioral Health

## 2024-04-08 NOTE — Telephone Encounter (Signed)
 Additional clinical questions for Aretha Beers completed and faxed to [employer]  Fax: [phone]

## 2024-04-08 NOTE — Telephone Encounter (Signed)
 Form for [employer] program faxed to [facility] for free 28=-day supply.  Enrollment form completed but pending PA from [employer] to be approved. Scanned to Onbase for now  Fax: [phone] Phone: [phone]  Geraldene Kleine, PharmD, MPH, BCPS, CPP Clinical Pharmacist (Rheumatology and Pulmonology)

## 2024-04-08 NOTE — Telephone Encounter (Signed)
 MR- if we want him to start back on esbriet , please advise on directions for use. He does still have some samples of Esbriet .

## 2024-04-08 NOTE — Telephone Encounter (Signed)
 Copied from CRM [id number]. Topic: Clinical - Medication Question >> Apr 08, 2024 11:16 AM [name] wrote: Reason for CRM: Gwen from [place] support calling to speak with pharmacist.  Callback number: [phone]

## 2024-04-08 NOTE — Telephone Encounter (Signed)
 Received return call from [place]. She advised of approval through [employer] which I told her I am aware of since I submitted the PA  Geraldene Kleine, PharmD, MPH, BCPS, CPP Clinical Pharmacist (Rheumatology and Pulmonology)

## 2024-04-08 NOTE — Telephone Encounter (Signed)
 Patient needs to start pirfenidone  at titration dosing using 267mg  tablets  Take 1 tab three times daily for 7 days, then 2 tabs three times daily for 7 days, then 3 tabs three times daily thereafter.  MyChart message sent to patient  Geraldene Kleine, PharmD, MPH, BCPS, CPP Clinical Pharmacist (Rheumatology and Pulmonology)

## 2024-04-08 NOTE — Telephone Encounter (Signed)
 Returned call to [place]- unable to reach. Left VM with my callback number  Geraldene Kleine, PharmD, MPH, BCPS, CPP Clinical Pharmacist (Rheumatology and Pulmonology)

## 2024-04-08 NOTE — Telephone Encounter (Signed)
 Received notification from [employer] regarding a prior authorization for YUTREPIA. Authorization has been APPROVED from 04/08/24 to 10/20/24. Approval letter sent to scan center.  Per test claim, copay for 7 days supply is $0 (28 capsules)  Authorization # [id number]  Randeen Busman enrollment form to [facility] with clinicals and HRCT  Fax: [phone] Phone: [phone]  Geraldene Kleine, PharmD, MPH, BCPS, CPP Clinical Pharmacist (Rheumatology and Pulmonology)

## 2024-04-14 NOTE — Telephone Encounter (Signed)
 Rc'd all three pages signed. Will fax to [phone]. Once confirmed will send to scan.

## 2024-04-16 ENCOUNTER — Ambulatory Visit (INDEPENDENT_AMBULATORY_CARE_PROVIDER_SITE_OTHER): Admitting: Behavioral Health

## 2024-04-16 DIAGNOSIS — F419 Anxiety disorder, unspecified: Secondary | ICD-10-CM | POA: Diagnosis not present

## 2024-04-16 NOTE — Progress Notes (Signed)
 [facility] Counselor/Therapist Progress Note  Patient ID: Samuel [name], MRN: [id number],    Date: 04/16/2024  Time Spent: 45 min Caregility video; Pt & Wife are in the home in private & Provider working remotely from [employer]. Pt & Wife are aware of the risks/limitations of telehealth & consents to Tx today. Time In: 12:00pm Time Out: 12:45pm  Treatment Type: Cpl Th  Reported Symptoms: Elevated anx/dep & stress due to health status changes  Mental Status Exam: Appearance:  Casual     Behavior: Appropriate and Sharing  Motor: Normal  Speech/Language:  Clear and Coherent  Affect: Appropriate  Mood: normal  Thought process: normal  Thought content:   WNL  Sensory/Perceptual disturbances:   WNL  Orientation: oriented to person, place, time/date, and situation  Attention: Good  Concentration: Good  Memory: WNL  Fund of knowledge:  Good  Insight:   Good  Judgment:  Good  Impulse Control: Good   Risk Assessment: Danger to Self:  No Self-injurious Behavior: No Danger to Others: No Duty to Warn:no Physical Aggression / Violence:No  Access to Firearms a concern: No  Gang Involvement:No   Subjective: Pt & Wife are traversing the health status changes that are occurring in the life of the Pt.  They have been trying to determine if they need the services of Palliative Care or Hospice.   Pt is concerned for his mortality.  Interventions: Family Systems  Diagnosis:Anxiety  Plan: Pt will acquire the works of Pasco Carder; 'The Healing Heart' & 'Anatomy of an Illness'. Cpl will read, take notes & we will discuss next visit.   Target Date: 05/21/2024  Progress: 5  Frequency: Once every 2-3 mos  Modality: Cpl Th   Samuel LITTIE Ling, LMFT

## 2024-04-16 NOTE — Progress Notes (Signed)
   Deneise Lever, LMFT

## 2024-04-20 NOTE — Telephone Encounter (Signed)
 Copied from CRM [id number]. Topic: Clinical - Prescription Issue >> Apr 19, 2024  3:24 PM Leila C wrote: Reason for CRM: [place] pharmacist from [facility] [phone] fax# [phone] states patient wants to up dosage increase every 2 weeks instead of weekly for YUTREPIA, original dosage is 26.5 mcg 1 capsule four times daily for one week, but patient wants to do every two weeks. If Dr. Geronimo agrees, please send new prescription and dosage. Please advise and call back.  Please advise

## 2024-04-21 ENCOUNTER — Other Ambulatory Visit: Payer: Self-pay | Admitting: Hematology and Oncology

## 2024-04-21 ENCOUNTER — Inpatient Hospital Stay: Attending: Hematology and Oncology

## 2024-04-21 ENCOUNTER — Inpatient Hospital Stay: Admitting: Hematology and Oncology

## 2024-04-21 VITALS — BP 132/82 | HR 56 | Temp 97.7°F | Resp 19 | Wt 192.7 lb

## 2024-04-21 DIAGNOSIS — Z79624 Long term (current) use of inhibitors of nucleotide synthesis: Secondary | ICD-10-CM | POA: Insufficient documentation

## 2024-04-21 DIAGNOSIS — Z79899 Other long term (current) drug therapy: Secondary | ICD-10-CM | POA: Diagnosis not present

## 2024-04-21 DIAGNOSIS — Z87891 Personal history of nicotine dependence: Secondary | ICD-10-CM | POA: Diagnosis not present

## 2024-04-21 DIAGNOSIS — C9000 Multiple myeloma not having achieved remission: Secondary | ICD-10-CM | POA: Insufficient documentation

## 2024-04-21 DIAGNOSIS — Z7969 Long term (current) use of other immunomodulators and immunosuppressants: Secondary | ICD-10-CM | POA: Diagnosis not present

## 2024-04-21 DIAGNOSIS — Z7961 Long term (current) use of immunomodulator: Secondary | ICD-10-CM | POA: Diagnosis not present

## 2024-04-21 LAB — CBC WITH DIFFERENTIAL (CANCER CENTER ONLY)
Abs Immature Granulocytes: 0.01 K/uL (ref 0.00–0.07)
Basophils Absolute: 0 K/uL (ref 0.0–0.1)
Basophils Relative: 0 %
Eosinophils Absolute: 0.5 K/uL (ref 0.0–0.5)
Eosinophils Relative: 10 %
HCT: 40.1 % (ref 39.0–52.0)
Hemoglobin: 13.6 g/dL (ref 13.0–17.0)
Immature Granulocytes: 0 %
Lymphocytes Relative: 20 %
Lymphs Abs: 1 K/uL (ref 0.7–4.0)
MCH: 30.7 pg (ref 26.0–34.0)
MCHC: 33.9 g/dL (ref 30.0–36.0)
MCV: 90.5 fL (ref 80.0–100.0)
Monocytes Absolute: 0.4 K/uL (ref 0.1–1.0)
Monocytes Relative: 8 %
Neutro Abs: 3.1 K/uL (ref 1.7–7.7)
Neutrophils Relative %: 62 %
Platelet Count: 217 K/uL (ref 150–400)
RBC: 4.43 MIL/uL (ref 4.22–5.81)
RDW: 12.8 % (ref 11.5–15.5)
WBC Count: 5.1 K/uL (ref 4.0–10.5)
nRBC: 0 % (ref 0.0–0.2)

## 2024-04-21 LAB — CMP (CANCER CENTER ONLY)
ALT: 24 U/L (ref 0–44)
AST: 26 U/L (ref 15–41)
Albumin: 3.9 g/dL (ref 3.5–5.0)
Alkaline Phosphatase: 54 U/L (ref 38–126)
Anion gap: 5 (ref 5–15)
BUN: 17 mg/dL (ref 8–23)
CO2: 31 mmol/L (ref 22–32)
Calcium: 9.4 mg/dL (ref 8.9–10.3)
Chloride: 98 mmol/L (ref 98–111)
Creatinine: 0.74 mg/dL (ref 0.61–1.24)
GFR, Estimated: >60 mL/min
Glucose, Bld: 119 mg/dL — ABNORMAL HIGH (ref 70–99)
Potassium: 3.9 mmol/L (ref 3.5–5.1)
Sodium: 134 mmol/L — ABNORMAL LOW (ref 135–145)
Total Bilirubin: 0.3 mg/dL (ref 0.0–1.2)
Total Protein: 8 g/dL (ref 6.5–8.1)

## 2024-04-21 LAB — LACTATE DEHYDROGENASE: LDH: 244 U/L — ABNORMAL HIGH (ref 98–192)

## 2024-04-21 NOTE — Progress Notes (Signed)
 [facility] [phone]   [phone]  ONCOLOGY PROGRESS NOTE  Patient Care Team: Seabron Lenis, MD as PCP - General (Family Medicine) Elmira Newman PARAS, MD as PCP - Cardiology (Cardiology)  Hematological/Oncological History # IgG Kappa Multiple Myeloma 02/02/2021:  Presented to [facility] due to right sided flank tenderness with bruising. CT abdomen/pelvis: Multiple small lytic lesions in the thoracolumbar spine and bilateral pelvis -SPEP: IgG 2,082 (H), M Protein 1.8 (H). IFE shows IgG monoclonal protein with kappa light chain specificity.  -LDH 169, CBC normal, CMP normal except for sodium 131 (L), Chloride 96 (L).   02/14/2021: Establish care with Johnston Police PA-C 02/22/2021: bone marrow biopsy confirms the diagnosis of Multiple Myeloma with a monoclonal plasma cell population.  03/16/2021: Cycle 1 Day 1 of VRd chemotherapy  04/06/2021: Cycle 2 Day 1 of VRd chemotherapy  04/27/2021: Cycle 3 Day 1 of VRd chemotherapy  05/18/2021: Cycle 4 Day 1 of VRd chemotherapy  06/01/2021: drop dexamethasone  to 20mg  PO weekly and start lasix  due to shortness of breath.  06/15/2021: Desaturation to 87% on ambulation. HELD velcade  today and sent to ED for evaluation.  06/22/2021: Findings consistent with drug reaction the lungs with eosinophils on BAL.  Given these findings we will definitely hold [name]  and plan to avoid pomalidomide 06/29/2021: Cycle 1 Day 1 CyBorD chemotherapy 07/05/2021: HOLD chemotherapy given worsening lung function  Interval History:  Samuel [name] 68 y.o. male with medical history significant for IgG Kappa multiple myeloma who presents for a follow up visit. The patient's last visit was on 01/29/2024. He is accompanied by his wife for this visit.  Mr. Samuel [name] reports he has continued to struggle with breathing.  He reports when walking he has to use 8 L of oxygen and can use anywhere from 2 to 4 L when sitting.  He notes that with exertion he can  go as high as 10 L.  He reports that the summer has been very difficult and he has been extremely congested.  He notes that he continues to work with pulmonology to try to improve this.  He notes he is also having some difficulty with hesitancy for urination and with frequent urination he is having trouble getting back and forth to the bathroom due to his shortness of breath.  He reports that the urine itself is not foul-smelling, with change in color or bubbling/vomiting.  He reports that he feels like he is getting more weak and in general feels unwell.  He denies any fevers, chills, sweats.  A full 10 point ROS is otherwise negative.  Today we had a detailed discussion about steps moving forward.  We noted that his M protein is elevated and he will require start of treatment at some point, however given his unstable lung function this would be too risky to begin at this time.  He voices understanding and wanted to continue monitoring at this time.   MEDICAL HISTORY:  Past Medical History:  Diagnosis Date   Asthma    GERD (gastroesophageal reflux disease)    High cholesterol    under control.    History of blood transfusion    Hypothyroidism    Interstitial lung disease (HCC)    Multiple myeloma (HCC)    Perennial allergic rhinitis    Pneumonia    Sciatic pain, right    Seasonal allergic rhinitis    Thyroid disease     SURGICAL HISTORY: Past Surgical History:  Procedure Laterality Date   BRONCHIAL  BIOPSY  06/18/2021   Procedure: BRONCHIAL BIOPSIES;  Surgeon: Shelah Lamar RAMAN, MD;  Location: [facility];  Service: Cardiopulmonary;;   BRONCHIAL BIOPSY  08/28/2021   Procedure: BRONCHIAL BIOPSIES;  Surgeon: Brenna Adine CROME, DO;  Location: [facility];  Service: Cardiopulmonary;;   BRONCHIAL WASHINGS  06/18/2021   Procedure: BRONCHIAL WASHINGS;  Surgeon: Shelah Lamar RAMAN, MD;  Location: [facility];  Service: Cardiopulmonary;;   BRONCHIAL WASHINGS  08/28/2021   Procedure: BRONCHIAL  WASHINGS;  Surgeon: Brenna Adine CROME, DO;  Location: [facility];  Service: Cardiopulmonary;;   NASAL SINUS SURGERY     NECK SURGERY  1996   RIGHT HEART CATH N/A 01/23/2024   Procedure: RIGHT HEART CATH;  Surgeon: Elmira Newman PARAS, MD;  Location: [facility];  Service: Cardiovascular;  Laterality: N/A;   VIDEO BRONCHOSCOPY N/A 06/18/2021   Procedure: VIDEO BRONCHOSCOPY WITH FLUORO;  Surgeon: Shelah Lamar RAMAN, MD;  Location: [facility];  Service: Cardiopulmonary;  Laterality: N/A;   VIDEO BRONCHOSCOPY N/A 08/28/2021   Procedure: VIDEO BRONCHOSCOPY WITH FLUORO;  Surgeon: Brenna Adine CROME, DO;  Location: [facility];  Service: Cardiopulmonary;  Laterality: N/A;    SOCIAL HISTORY: Social History   Socioeconomic History   Marital status: Married    Spouse name: Not on file   Number of children: 3   Years of education: Not on file   Highest education level: Bachelor's degree (e.g., BA, AB, BS)  Occupational History   Not on file  Tobacco Use   Smoking status: Former    Current packs/day: 0.00    Average packs/day: 0.1 packs/day for 3.0 years (0.3 ttl pk-yrs)    Types: Cigarettes    Start date: 10/22/1979    Quit date: 10/21/1982    Years since quitting: 41.5   Smokeless tobacco: Never  Vaping Use   Vaping status: Never Used  Substance and Sexual Activity   Alcohol use: Not Currently    Comment: 1 drink daily   Drug use: No   Sexual activity: Not on file  Other Topics Concern   Not on file  Social History Narrative   Not on file   Social Drivers of Health   Financial Resource Strain: Low Risk  (03/15/2022)   Received from [facility]   Overall Financial Resource Strain (CARDIA)    Difficulty of Paying Living Expenses: Not hard at all  Food Insecurity: Low Risk  (01/14/2024)   Received from [facility]   Hunger Vital Sign    Within the past 12 months, you worried that your food would run out before you got money to buy more: Never true    Within the past 12 months,  the food you bought just didn't last and you didn't have money to get more. : Never true  Transportation Needs: No Transportation Needs (01/14/2024)   Received from [facility]    In the past 12 months, has lack of reliable transportation kept you from medical appointments, meetings, work or from getting things needed for daily living? : No  Physical Activity: Insufficiently Active (01/10/2022)   Received from [facility]   Exercise Vital Sign    Days of Exercise per Week: 3 days    Minutes of Exercise per Session: 30 min  Stress: No Stress Concern Present (01/10/2022)   Received from [facility] of Occupational Health - Occupational Stress Questionnaire    Feeling of Stress : Only a little  Social Connections: Socially Integrated (01/10/2022)  Received from [facility]   Social Connection and Isolation Panel    Frequency of Communication with Friends and Family: More than three times a week    Frequency of Social Gatherings with Friends and Family: Twice a week    Attends Religious Services: 1 to 4 times per year    Active Member of [employer] or Organizations: Yes    Attends Engineer, structural: More than 4 times per year    Marital Status: Married  Catering manager Violence: Not At Risk (03/15/2022)   Received from [facility]   Humiliation, Afraid, Rape, and Kick questionnaire    Fear of Current or Ex-Partner: No    Emotionally Abused: No    Physically Abused: No    Sexually Abused: No    FAMILY HISTORY: Family History  Problem Relation Age of Onset   Hypertension Mother    Allergies Mother    Allergies Father    CAD Father 66   Breast cancer Paternal Grandmother    Hypertension Other    Heart disease Other     ALLERGIES:  is allergic to clarithromycin, ofev  [nintedanib], and penicillins.  MEDICATIONS:  Current Outpatient Medications  Medication Sig Dispense Refill   acyclovir  (ZOVIRAX ) 400 MG tablet Take 1 tablet (400 mg  total) by mouth 2 (two) times daily. (Patient taking differently: Take 400 mg by mouth daily.) 60 tablet 3   albuterol  (PROVENTIL ) (2.5 MG/3ML) 0.083% nebulizer solution USE ONE VIAL IN NEBULIZER EVERY 6 HOURS AS NEEDED WHEEZING OR SHORTNESS OF BREATH 120 mL 12   albuterol  (VENTOLIN  HFA) 108 (90 Base) MCG/ACT inhaler Inhale 2 puffs into the lungs every 6 hours as needed for wheezing or shortness of breath. 8.5 g 1   ALPRAZolam  (XANAX ) 0.5 MG tablet Take 0.5 mg by mouth at bedtime.     amLODipine  (NORVASC ) 5 MG tablet Take 1 tablet (5 mg total) by mouth daily. 90 tablet 3   ASSESS FULL RANGE PEAK METER DEVI as directed.     azelastine  (ASTELIN ) 0.1 % nasal spray Place 2 sprays into both nostrils 2 (two) times daily. Use in each nostril as directed (Patient taking differently: Place 2 sprays into both nostrils 2 (two) times daily as needed for allergies or rhinitis. Use in each nostril as directed) 30 mL 12   bisoprolol  (ZEBETA ) 5 MG tablet Take 1 tablet (5 mg total) by mouth daily. 30 tablet 2   Budeson-Glycopyrrol-Formoterol (BREZTRI  AEROSPHERE) 160-9-4.8 MCG/ACT AERO Inhale 2 puffs into the lungs in the morning and at bedtime. 10.7 g 11   cetirizine  (ZYRTEC ) 10 MG tablet Take 1 tablet (10 mg total) by mouth daily. 30 tablet 11   cholecalciferol (VITAMIN D) 1000 UNITS tablet Take 3,000 Units by mouth daily.     dextromethorphan (DELSYM) 30 MG/5ML liquid Take 30 mg by mouth as needed for cough.     EPINEPHrine  (EPI-PEN) 0.3 mg/0.3 mL DEVI Inject 0.3 mg into the muscle as needed.     ezetimibe -simvastatin  (VYTORIN ) 10-40 MG tablet Take 1 tablet by mouth daily. 90 tablet 3   fluticasone  (FLONASE) 50 MCG/ACT nasal spray Place 1 spray into both nostrils daily as needed for allergies or rhinitis.     HYDROcodone  bit-homatropine (HYCODAN) 5-1.5 MG/5ML syrup Take 5 mLs by mouth every 6 (six) hours as needed for cough. 240 mL 0   ibuprofen (ADVIL) 200 MG tablet Take 200 mg by mouth every 6 (six) hours as  needed for moderate pain (pain score 4-6).  ketoconazole (NIZORAL) 2 % cream Apply 1 Application topically 2 (two) times daily as needed for irritation.     levothyroxine  (SYNTHROID ) 50 MCG tablet Take 50 mcg by mouth every morning.     liothyronine (CYTOMEL) 5 MCG tablet Take 5 mcg by mouth daily.     montelukast  (SINGULAIR ) 10 MG tablet Take 10 mg by mouth at bedtime.     Multiple Vitamin (MULTIVITAMIN) tablet Take 1 tablet by mouth daily.     omeprazole  (PRILOSEC) 40 MG capsule Take 1 capsule (40 mg total) by mouth 2 (two) times daily. Take 30 minutes before a meal 60 capsule 3   OXYGEN Inhale 2 L into the lungs continuous.     sildenafil (REVATIO) 20 MG tablet Take 20 mg by mouth daily as needed (ED).     Simethicone  (SIMETHICONE  ULTRA STRENGTH) 180 MG CAPS Take 1 capsule (180 mg total) by mouth 3 (three) times daily as needed. 90 capsule 0   sodium chloride  (OCEAN) 0.65 % SOLN nasal spray Place 1 spray into both nostrils as needed for congestion.     tamsulosin  (FLOMAX ) 0.4 MG CAPS capsule Take 0.4 mg by mouth daily.     triamcinolone  cream (KENALOG ) 0.1 % Apply 1 application topically daily as needed (sun burn itch).     Turmeric 400 MG CAPS Take 1 capsule by mouth daily.     No current facility-administered medications for this visit.    REVIEW OF SYSTEMS:   All other systems were reviewed with the patient and are negative.  PHYSICAL EXAMINATION: ECOG PERFORMANCE STATUS: 1 - Symptomatic but completely ambulatory  GENERAL: well appearing middle-aged Caucasian male in NAD  SKIN: skin color, texture, turgor are normal, no rashes or significant lesions EYES: conjunctiva are pink and non-injected, sclera clear LUNGS: clear to auscultation and percussion with normal breathing effort HEART: regular rate & rhythm and no murmurs and no lower extremity edema Musculoskeletal: no cyanosis of digits and no clubbing  PSYCH: alert & oriented x 3, fluent speech NEURO: no focal motor/sensory  deficits   LABORATORY DATA:  I have reviewed the data as listed    Latest Ref Rng & Units 04/21/2024    8:18 AM 03/24/2024    8:09 AM 02/25/2024    8:27 AM  CBC  WBC 4.0 - 10.5 K/uL 5.1  6.5  7.4   Hemoglobin 13.0 - 17.0 g/dL 86.3  86.4  86.5   Hematocrit 39.0 - 52.0 % 40.1  39.6  39.5   Platelets 150 - 400 K/uL 217  222  195        Latest Ref Rng & Units 03/24/2024    8:09 AM 02/25/2024    8:27 AM 01/29/2024   10:22 AM  CMP  Glucose 70 - 99 mg/dL 867  94  897   BUN 8 - 23 mg/dL 16  16  20    Creatinine 0.61 - 1.24 mg/dL 9.27  9.11  9.09   Sodium 135 - 145 mmol/L 133  134  135   Potassium 3.5 - 5.1 mmol/L 4.1  4.0  4.5   Chloride 98 - 111 mmol/L 97  95  98   CO2 22 - 32 mmol/L 30  32  32   Calcium  8.9 - 10.3 mg/dL 9.2  9.4  9.5   Total Protein 6.5 - 8.1 g/dL 8.3  8.5  8.7   Total Bilirubin 0.0 - 1.2 mg/dL 0.4  0.4  0.4   Alkaline Phos 38 - 126 U/L 51  57  55   AST 15 - 41 U/L 27  26  25    ALT 0 - 44 U/L 20  17  17      Lab Results  Component Value Date   MPROTEIN Not Observed 03/24/2024   MPROTEIN 0.6 (H) 02/25/2024   MPROTEIN 0.6 (H) 01/29/2024   Lab Results  Component Value Date   KPAFRELGTCHN 43.4 (H) 03/24/2024   KPAFRELGTCHN 50.1 (H) 02/25/2024   KPAFRELGTCHN 55.2 (H) 01/29/2024   LAMBDASER 18.5 03/24/2024   LAMBDASER 20.6 02/25/2024   LAMBDASER 23.4 01/29/2024   KAPLAMBRATIO 2.35 (H) 03/24/2024   KAPLAMBRATIO 2.43 (H) 02/25/2024   KAPLAMBRATIO 2.36 (H) 01/29/2024    RADIOGRAPHIC STUDIES: I have personally reviewed the radiological images as listed and agreed with the findings in the report: lytic lesions in the hip bones bilaterally.  No results found.    ASSESSMENT & PLAN Samuel [name] is a 68 y.o. male with medical history significant for IgG Kappa multiple myeloma who presents for a follow up visit.   After review of the labs, review of the records, and discussion with the patient the findings are most consistent with an IgG kappa multiple myeloma.   The patient has 2 lytic lesions within the pelvic bones (more noted in spine on CT scan) as well as 10% plasma cells within the bone marrow biopsy.  This combined with his serological findings confirm the diagnosis of multiple myeloma.  The initial treatment of choice for this patient's multiple myeloma was VRd. This will consist of bortezomib  1.5mg /m2 on days 1, 8, 15, dexamethasone  40mg  on days 1,8, and 15, and revlimid  25mg  PO daily days 1-14. Cycles will consist of 21 days. We will use this regimen to stabilize the patient's myeloma, then make a referral to a BMT center of his choosing for consideration of a bone marrow transplant once he has been noted to have a good response (VGPR or better). Zometa  was started after dental clearance was obtained. This will be continued x 2 years.   Unfortunately had a drug reaction to [name]  and was admitted to the hospital from 06/15/2021 until 06/18/2021.  The patient underwent a B AL which clearly showed evidence of eosinophils.  This is consistent with a drug reaction most likely caused by the patient's [name] .  Discussed the case with pulmonology who recommended that we not rechallenge for attempt other drugs in the same class such as pomalidomide.  Given the patient's excellent response to [name] -based therapy might preference would be to continue that.  As such I would recommend we proceed with CyBorD chemotherapy.  Daratumumab-based therapy could have been considered, but is often paired with an immunologic.  Additionally I would like to preserve those for additional lines of therapy if necessary.  Therefore we will proceed with CyBorD chemotherapy have the patient referred to transplant.  R-IPSS score: Stage 2. PFS of 42 months  # IgG Kappa Multiple Myeloma (t11;14, standard risk)  --diagnostic criteria was met with monoclonal plasma cells in the bone marrow and lytic lesions on the bone --recommend proceeding with VRd chemotherapy as noted  above --patient is young and healthy enough for consideration of BMT, though he is borderline with his age. Will consider referral pending response to treatment.  -- Cycle 1 Day 1 started on 03/16/2021 --decreased dexamethasone  to 20mg  PO weekly due to fluid overload. Also started lasix  20mg  PO (changed on 06/01/2021) --plan to HOLD revlimid  moving forward after evidence of drug reaction in the lungs. --started CyBorD  chemotherapy on 06/29/2021 but held starting 07/05/2021 given worsening lung function --patient has reached a VGPR.  Plan: --Labs from today were reviewed with patient. CBC is unremarkable without any cytopenias. Creatinine and calcium  levels are normal. Myeloma panel pending today --M protein is stable, with last myeloma panel from 02/25/2024 measuring M protein at 0.6 g/dL. --Discussed the need to start treatment, however given his worsening lung function this would be inadvisable at this time. --[facility] recommended Dara/[name] /dexamethasone  which we agree is a reasonable regimen. Patient was aware of the risks and benefits regarding possible worsening of the lung function. --Discussed the risk of waiting to resume treatment including end organ damage including cytopenias, renal dysfunction and pathologic fractures.  --Labs today show white blood cell 5.1, hemoglobin 13.6, MCV 90.5, platelets 217 --RTC in 12 weeks with interval 4 week with labs   # Drug Reaction in the Lung --confirmed with increased eosinophils on BAL during admission for shortness of breath --on pirfenidone  and steroid taper managed by pulmonologist, Dr. Geronimo.  # Supportive Care -- chemotherapy education complete --zometa  therapy started on 04/05/2021 (4 mg IV q 3 months), treatment on hold per patient's request. -- zofran  8mg  q8H PRN and compazine  10mg  PO q6H for nausea -- acyclovir  400mg  PO BID for VCZ prophylaxis -- no pain medication required at this time.   No orders of the defined types were  placed in this encounter.  All questions were answered. The patient knows to call the clinic with any problems, questions or concerns.  I have spent a total of 30 minutes minutes of face-to-face and non-face-to-face time, preparing to see the patient,performing a medically appropriate examination, counseling and educating the patient,  documenting clinical information in the electronic health record,  and care coordination.   Norleen IVAR Kidney, MD Department of Hematology/Oncology [facility] at [facility] Phone: [phone] Pager: [phone] Email: [email or web address]  04/21/2024 8:50 AM

## 2024-04-21 NOTE — Telephone Encounter (Signed)
 This is fine since patient has had concerns and side effects with [name] in past. We will titrate dose every 2 weeks as per patient's request. Spoke with Josepha Lied, and provided verbal for this  Sherry Pennant, PharmD, MPH, BCPS, CPP Clinical Pharmacist (Rheumatology and Pulmonology)

## 2024-04-22 ENCOUNTER — Telehealth: Payer: Self-pay | Admitting: Hematology and Oncology

## 2024-04-22 LAB — KAPPA/LAMBDA LIGHT CHAINS
Kappa free light chain: 41.7 mg/L — ABNORMAL HIGH (ref 3.3–19.4)
Kappa, lambda light chain ratio: 2.24 — ABNORMAL HIGH (ref 0.26–1.65)
Lambda free light chains: 18.6 mg/L (ref 5.7–26.3)

## 2024-04-22 NOTE — Telephone Encounter (Signed)
Scheduled appointments with the patient

## 2024-04-25 ENCOUNTER — Encounter: Payer: Self-pay | Admitting: Hematology and Oncology

## 2024-04-25 LAB — MULTIPLE MYELOMA PANEL, SERUM
Albumin SerPl Elph-Mcnc: 3.4 g/dL (ref 2.9–4.4)
Albumin/Glob SerPl: 0.9 (ref 0.7–1.7)
Alpha 1: 0.2 g/dL (ref 0.0–0.4)
Alpha2 Glob SerPl Elph-Mcnc: 0.8 g/dL (ref 0.4–1.0)
B-Globulin SerPl Elph-Mcnc: 1.1 g/dL (ref 0.7–1.3)
Gamma Glob SerPl Elph-Mcnc: 1.9 g/dL — ABNORMAL HIGH (ref 0.4–1.8)
Globulin, Total: 4 g/dL — ABNORMAL HIGH (ref 2.2–3.9)
IgA: 343 mg/dL (ref 61–437)
IgG (Immunoglobin G), Serum: 1900 mg/dL — ABNORMAL HIGH (ref 603–1613)
IgM (Immunoglobulin M), Srm: 120 mg/dL (ref 20–172)
M Protein SerPl Elph-Mcnc: 0.7 g/dL — ABNORMAL HIGH
Total Protein ELP: 7.4 g/dL (ref 6.0–8.5)

## 2024-04-29 ENCOUNTER — Telehealth: Payer: Self-pay | Admitting: *Deleted

## 2024-04-29 NOTE — Telephone Encounter (Signed)
 Copied from CRM [id number]. Topic: Clinical - Medical Advice >> Apr 28, 2024 10:51 AM Joesph PARAS wrote: Reason for CRM: [facility] is calling to inquire if the physician start of care interim order has been received. States faxed on 07/03 and again today, as well as emailed to provider using [employer] as well 07/03.   C/B at [phone] ext [phone] with confidential VM.   duplicate

## 2024-04-29 NOTE — Telephone Encounter (Signed)
 Copied from CRM [id number]. Topic: General - Other >> Apr 22, 2024  2:58 PM Whitney O wrote: Reason for CRM: [facility] sent over nursing order docusign but they will fax over nursing order   This was completed on 04/27/24 and form placed in scan folder.

## 2024-05-05 ENCOUNTER — Telehealth: Payer: Self-pay | Admitting: *Deleted

## 2024-05-05 ENCOUNTER — Telehealth: Payer: Self-pay | Admitting: Internal Medicine

## 2024-05-05 DIAGNOSIS — I2723 Pulmonary hypertension due to lung diseases and hypoxia: Secondary | ICD-10-CM

## 2024-05-05 DIAGNOSIS — J849 Interstitial pulmonary disease, unspecified: Secondary | ICD-10-CM

## 2024-05-05 DIAGNOSIS — J9611 Chronic respiratory failure with hypoxia: Secondary | ICD-10-CM

## 2024-05-05 NOTE — Telephone Encounter (Signed)
 Sonny, has this been handled?

## 2024-05-05 NOTE — Telephone Encounter (Signed)
 Copied from CRM [id number]. Topic: General - Other >> May 05, 2024 12:01 PM Corean SAUNDERS wrote: Reason for CRM: Patient is requesting to speak Sonny and states its very urgent and declined to provide details. Please call patient back at [phone] (M)   See other encounter dated today

## 2024-05-05 NOTE — Telephone Encounter (Signed)
 [facility] nurse on the line insisting on speaking with Samuel [name] with [employer] who is not avail because she is in [place]. Apparently Dr. Geronimo told her Samuel [name] would send an order for an O2 humidifier to [employer]. The lady at [employer] is just waiting for the phone call to fill order:   [name].   Once she get the order [employer] can get someone out very quickly. Questions: [facility] Nurse Karna Nett is [phone] if questions.

## 2024-05-05 NOTE — Telephone Encounter (Signed)
 Order placed for humidity to be added to o2  [facility] is faxing to [employer] this afternoon  Pt and [facility] RN, Karna have been made aware  Nothing further needed

## 2024-05-05 NOTE — Telephone Encounter (Signed)
 I called and spoke with pt and notified we will take care of the humidity for o2 for him  Spoke with

## 2024-05-07 ENCOUNTER — Ambulatory Visit: Admitting: Behavioral Health

## 2024-05-10 ENCOUNTER — Other Ambulatory Visit: Payer: Self-pay | Admitting: Internal Medicine

## 2024-05-10 ENCOUNTER — Telehealth: Payer: Self-pay

## 2024-05-10 ENCOUNTER — Telehealth: Payer: Self-pay | Admitting: *Deleted

## 2024-05-10 NOTE — Telephone Encounter (Signed)
 Pt called asking questions about first palliative appt, all questions answered, no further needs at this time.

## 2024-05-10 NOTE — Telephone Encounter (Unsigned)
 Copied from CRM [id number]. Topic: Clinical - Prescription Issue >> May 10, 2024  8:05 AM Russell PARAS wrote: Reason for CRM:   Connell, with [facility], is contacting clinic regarding pt's prescribed Olga, that they dispense for him.  Pt contacted pharmacy reporting he was having SOB and broncho spasms with the medication, which is at 26.5 mg dosage.  Carly wanted to inform the provider and get clarification on if they should continue refilling this medication or if pt may require an alternative med.  CB#  [phone]

## 2024-05-10 NOTE — Telephone Encounter (Signed)
  Samuel [name]  Can you please ensure his O2 is humidified  Thanks    SIGNATURE    Dr. Dorethia Cave, M.D., F.C.C.P,  Pulmonary and Critical Care Medicine Staff Physician, [facility] Center Director - Interstitial Lung Disease  Program  Pulmonary Fibrosis [facility] at [facility] [place], [place], [address]   Pager: [phone], If no answer  -> Check AMION or Try [phone] Telephone (clinical office): [phone] Telephone (research): [phone]  12:05 PM 05/10/2024   xxxxxxx This message came last week from [facility] RN DEnise   Hello Dr. Cave,  This Karna Nett, RN with [facility]. I started [name] on [place] today and he did very well. Since I have been seeing him, he has complained about his sinus issues (they even bled today). I never asked if his oxygen was humidified. It is not. I called the oxygen provider's office and they need an order for humidification. I have the information to email the representative directly. Please have someone from your office contact me so I can get this taken care of as quickly as possible. Thank you for your prompt attention to this matter. Karna Nett, [place]  [facility] Nurse [phone]  Thanks Dr !.

## 2024-05-10 NOTE — Telephone Encounter (Signed)
 [name] From pt regarding starting on low dose prednisone  for pulmonary reasons as well as to take it to keep his multiple myeloma at bay. He is asking Dr. Lafonda opinion about this. He has started a new medication for his ILD but he states it is not working-actually he thinks it is doing the opposite  of its intended function. Advised that I would send a message to Dr. Federico and then get back with him. Signe voiced understanding.

## 2024-05-10 NOTE — Telephone Encounter (Signed)
 Yes, see encounter 05/05/24

## 2024-05-10 NOTE — Telephone Encounter (Signed)
 Hold yutrepia till he sees me 05/12/24. Will reasess at followup

## 2024-05-11 ENCOUNTER — Encounter (INDEPENDENT_AMBULATORY_CARE_PROVIDER_SITE_OTHER): Payer: Self-pay | Admitting: Otolaryngology

## 2024-05-11 ENCOUNTER — Ambulatory Visit (INDEPENDENT_AMBULATORY_CARE_PROVIDER_SITE_OTHER): Admitting: Otolaryngology

## 2024-05-11 ENCOUNTER — Ambulatory Visit: Payer: Self-pay | Admitting: Internal Medicine

## 2024-05-11 VITALS — BP 137/86 | HR 63

## 2024-05-11 DIAGNOSIS — J3089 Other allergic rhinitis: Secondary | ICD-10-CM

## 2024-05-11 DIAGNOSIS — R0981 Nasal congestion: Secondary | ICD-10-CM

## 2024-05-11 DIAGNOSIS — K219 Gastro-esophageal reflux disease without esophagitis: Secondary | ICD-10-CM | POA: Diagnosis not present

## 2024-05-11 DIAGNOSIS — J3489 Other specified disorders of nose and nasal sinuses: Secondary | ICD-10-CM

## 2024-05-11 DIAGNOSIS — Z8709 Personal history of other diseases of the respiratory system: Secondary | ICD-10-CM

## 2024-05-11 DIAGNOSIS — R053 Chronic cough: Secondary | ICD-10-CM

## 2024-05-11 DIAGNOSIS — R0982 Postnasal drip: Secondary | ICD-10-CM

## 2024-05-11 DIAGNOSIS — Z9981 Dependence on supplemental oxygen: Secondary | ICD-10-CM

## 2024-05-11 MED ORDER — OMEPRAZOLE 40 MG PO CPDR: 40.0000 mg | DELAYED_RELEASE_CAPSULE | Freq: Two times a day (BID) | ORAL | 3 refills | Status: DC

## 2024-05-11 NOTE — Telephone Encounter (Signed)
 Pls look at message from yesterday. He should hold his YTUREPIA

## 2024-05-11 NOTE — Telephone Encounter (Signed)
 Spoke with the pt and notified of response from MR  He verbalized understanding  Nothing further needed

## 2024-05-11 NOTE — Telephone Encounter (Addendum)
 FYI Only or Action Required?: Action required by provider: update on patient condition and request by nurse from [facility] for Dr. Geronimo to discuss regular tyvaso vapor inhaler with pt at appt.  Patient is followed in Pulmonology for ILD and chronic respiratory failure, last seen on 03/19/2024 by Geronimo Amel, MD.  Called Nurse Triage reporting Medication Reaction, Shortness of Breath, Cough, Foot Swelling, Anxiety, Joint Swelling, and Weakness.  Symptoms began several weeks ago.  Interventions attempted: Rescue inhaler, Maintenance inhaler, Home oxygen use, and Other: home health.  Symptoms are: gradually improving.  Triage Disposition: See HCP Within 4 Hours (Or PCP Triage)  Patient/caregiver understands and will follow disposition?: No, wishes to speak with PCP      Copied from CRM [id number]. Topic: Clinical - Red Word Triage >> May 11, 2024  9:39 AM Nathanel DEL wrote: Red Word that prompted transfer to Nurse Triage: Karna w/ [facility] spoke w/ pt yesterday.  Pt started a new med, not doing well would like to speak w/ a nurse    Reason for Disposition  [1] Longstanding difficulty breathing (e.g., CHF, COPD, emphysema) AND [2] WORSE than normal  Answer Assessment - Initial Assessment Questions 1. REASON FOR CALL: What is the main reason for your call? or How can I best help you?      Karna Nett, nurse with [facility], calling to report symptoms and info from yesterday  Spoke with him yesterday 1 week follow up, when first took treatment was very excited Causing bronchospasms just as DPI did, had one that was so bad, wife was out walking dog, thought going to die On 10 L oxygen Last week constipation Terrible coughing spells Feet swelling Increased need O2 increased working of breathing even at rest Started yutrepia on 7/14, stopped taking it 7/20 then when spoke yesterday told him to continue to hold Was supposed to let me know if felt any worse, knew seeing Ramaswamy  tomorrow am They want me on phone and talk to [facility] tomorrow Regular tyvaso vapor inhaler, would like Ramaswamy to talk to them about that tomorrow, see if option for him with insurance and such Just very concerned about him He has tremendous amount of anxiety Powder inhalers are making it worse   This nurse to call pt and triage.  Answer Assessment - Initial Assessment Questions E2C2 Pulmonary Triage - Initial Assessment Questions Chief Complaint (e.g., cough, sob, wheezing, fever, chills, sweat or additional symptoms) *Go to specific symptom protocol after initial questions. Still having some chest pain and dizziness but nowhere near how it was on Saturday or Sunday Chest pain 2/10 intermittent, not longer than 5 min at a time Bronchospasms and these weird yawns Not really experiencing these anymore, occasional yawns but not really Not today Breathing has been getting better since stopping the medication Dizziness coming and going, not really feeling like going to pass out with this dizziness Feeling weaker than usual just overall, getting better since stopping med Both feet and ankles little puffy, not cold or red, a little bit pain in legs, just losing a lot of strength and muscle, not so much pain just weakness in my legs, sort of stumbling about more, not fallen but stumble some, all the time since few weeks ago, before taking yutrepia, staying the same not worsening Feel lot more asthma-like conditions Wheezing Not struggling to breathe A little better, not resolved Will be in a wheelchair tomorrow, use so much oxygen on a portable that just use a wheelchair to use less  oxygen, get around more effectively At home able to walk around still with oxygen Little constipation  Have you used your inhalers/maintenance medication? Yes If yes, What medications? Breztri  Albuterol  inhaler using more and more because of the medication  If inhaler, ask How many puffs and how  often? Note: Review instructions on medication in the chart. 2 puffs twice a day 4 hours apart at various times, yes helping each time  OXYGEN: Do you wear supplemental oxygen? Yes If yes, How many liters are you supposed to use? 8L with exertion, all over the place, at my office now and on a machine and at 3.5-4L and stationary and at 97% oxygen level then at home at 6-6.5L just sitting but if move about gotta get to 8L and that all kind of stepped up when went on yutrepia Oxygen level dropping into the 70s, sudden movement, at 6-6.5L When go to bathroom that's the worst time, getting up from toilet Drops to 87-89% just doesn't come back up that fast when on 8L  6. CARDIAC HISTORY: Do you have any history of heart disease? (e.g., heart attack, angina, bypass surgery, angioplasty)      CAD, HTN  7. LUNG HISTORY: Do you have any history of lung disease?  (e.g., pulmonary embolus, asthma, emphysema)     Significant   Pt was previously scheduled to see Dr. Geronimo, and per pt chart pt been discussing symptoms with Dr. Geronimo in meantime and will reassess at followup on 7/22 per his note on 7/21. Advised pt call back if symptoms start worsening again, advised go to hospital if weakness worsening or much more SOB, or new symptoms like nausea, excessive sweating. Sending message to pulm for update on pt condition before appt tomorrow am.  Protocols used: Information Only Call - No Triage-A-AH, Breathing Difficulty-A-AH   Pt has been continuing to hold the yutrepia per advice of Dr. Geronimo and home health nurses.

## 2024-05-11 NOTE — Progress Notes (Signed)
 ENT Progress Note:   Update 05/11/2024  Discussed Samuel use of AI scribe software for clinical note transcription with Samuel [name], who gave verbal consent to proceed.  History of Present Illness Samuel [name] is a 68 year old male with pulmonary hypertension/ILD who presents with persistent cough and breathing difficulties.  He experiences a persistent cough and breathing difficulties. Previously, he used a dry powder inhaler, which was discontinued due to bronchial spasms. He continues to perform nasal rinses twice daily, which he finds helpful, although Samuel symptoms persist. He also uses a steroid nasal rinse a couple of times a week and inquires about Samuel use of Flonase on non-steroid rinse days.  Throat clearing has improved but he still feels weakness in Samuel throat area. Occasionally, he experiences some cough and uses Hikadin cough syrup, taking about half a teaspoon as needed. He also finds Advil and Xanax  helpful for his symptoms.  His oxygen requirement has increased recently. There was a previous discussion about a cardiac catheterization due to pulmonary hypertension, which was borderline. He tried a new medication, Katharyn, intended to dilate blood vessels in Samuel lungs, but had to stop after experiencing breathing difficulties by Samuel sixth or seventh day.  He uses a humidifier attached to his oxygen concentrator at home to help with dryness. He takes Allegra in Samuel morning and uses omeprazole  once daily and has recently refilled this medication.  He reports nasal congestion, which he believes may be related to nasal cannula oxygen use. He reports occasional epistaxis with increased steroid rinses.  Records Reviewed:  Initial Evaluation   Update last OV in April   Discussed Samuel use of AI scribe software for clinical note transcription with Samuel [name], who gave verbal consent to proceed.  History of Present Illness   Samuel [name] is a 68 year old male with  interstitial lung disease, on supplemental O2, who presents with persistent throat clearing and phlegm sensation.  He experiences persistent throat clearing and a sensation of phlegm that he is unable to clear.  He continues to experience this sensation despite using a reflux raft and taking omeprazole , which he feels has helped somewhat. Found speech therapy helpful and voice is overall better.    His cough has decreased, but he still feels that air does not flow freely through nares describing a sensation of not being able to take breaths deep enough. He uses nasal sprays and antihistamines, including Flonase and Claritin or Zyrtec , and performs nasal rinses (with steroids sometimes), which have reduced postnasal drainage but not completely resolved it. He has interstitial lung disease, which has worsened his symptoms, on O2 supplementation 24/7 with a CT chest being stable compared to Samuel last scant.   His voice is stronger after voice therapy but varies, and he speaks with effort. He correlates this with his lung function.  Records Reviewed:  Speech Therapy Notes 11/03/23 11/03/23: Entered with portable oxygen (pulse; 3 L). Wife, [name], present this session. Desaturated to 87% following short walk to SLP office. Steadily rose to 91% and 98% given rest break. Voice c/b hoarse, strained quality with dysfluent rate associated with impaired breath support. Did mildly improve throughout session; however, limited maintenance noted d/t impaired breath support. Continues to complain of throat congestion being main barrier to improved voicing. Dicussed previously recommended vocal hygiene practices, including reduction of throat clearing, increased hydration to thin mucous, and strict adherence to reflux recommendations. Inconsistent but increasing carryover of recommendations reported over last week, with  benefit. 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 Scheduled for MBSS in two days per [name] report of dysphagia to ENT. Will review documentation and recommendations and modify POC accordingly. May benefit from FEES d/t pt complaint of pharyngeal congestion. Recert completed d/t decline in speech and recent concern for dysphagia.    10-08-23: Overt SOB and hoarseness exhibited this session, which was notably worse than last session. Recommended f/u with pulmonologist d/t persistent difficulty breathing impacting voice and overall functioning. Recently saw ENT and pulmonary rehab. Re-educated ENT results and recommendations, including being increasingly intentional with reducing throat clearing and taking reflux raft for reflux management. Suggested gargling with water after inhaler use d/t SLP research re: dysphonia and inhalers. Plan to initiate new interventions, circumlaryngeal message and PhoRTE, next session pending pt respiratory baseline to address newly diagnosed vocal fold atrophy and supraglottic compression.    Initial Evaluation  Reason for Consult: dysphonia chronic cough and throat clearing    HPI: Discussed Samuel use of AI scribe software for clinical note transcription with Samuel [name], who gave verbal consent to proceed.  History of Present Illness   Samuel [name] is 28 yoM  with hx of interstitial lung disease, presents with chronic cough and sensation of mucus in Samuel throat, which have been ongoing for several years and have worsened with Samuel progression of their lung condition. Samuel [name] reports a history of multiple myeloma treatment, which led to Samuel diagnosis of interstitial lung disease in September 2022 which was thought to be caused by medication side effect. Samuel [name] denies any history of stroke or heart attack.  Samuel [name] uses oxygen intermittently, during exercise, at night during sleep, and occasionally while watching TV. They recently experienced  an exacerbation of their lung disease, which was managed with steroids. Samuel [name] also reports a history of environmental allergies and was on allergy  shots for approximately ten years, which they felt were beneficial. However, they discontinued Samuel shots due to their overall health condition.  Samuel [name] has been managing their nasal drainage with saline rinses daily, azelastine , and Flonase, usually once in Samuel morning. They also take Claritin almost daily. For reflux, they take omeprazole . They take mucinex as needed, and recently started Reflux Raft about a month ago. Samuel [name] also occasionally uses Delsym for cough.  Samuel [name] reports occasional difficulty swallowing, but denies any specific food causing more problems. They also report occasional coughing or choking with liquids. Samuel [name] has noticed voice changes, which seem to worsen towards Samuel end of Samuel day. They have been seeing a speech therapist for these issues and have one session remaining.   Samuel [name] has been experiencing these symptoms for several years, but they have been exacerbated by their lung condition.   Records Reviewed:  Video Visit with Pulm 09/26/23  68 year old male former smoker followed for interstitial lung disease, drug-induced pneumonitis (multiple myeloma drug treatment) and chronic respiratory failure and asthma Medical history significant for multiple myeloma diagnosed in 2022 followed by oncology   Today's video visit is a 2 week  follow-up for interstitial lung disease, drug-induced pneumonitis, chronic respiratory failure and asthma.  [name] was seen last visit 2 weeks ago with acute symptoms of cough, congestion and shortness of breath.  Was treated for possible asthmatic bronchitic exacerbation with a Z-Pak and a prednisone  taper.  Lab work showed normal BNP.   Flu and COVID  was negative.  White blood cell count was normal D-dimer was minimally elevated.  CT chest was negative for PE.  Showed  chronic interstitial lung disease with mildly increased groundglass opacities, unchanged lesions in Samuel left sixth rib and Samuel left inferior scapula.  Since last visit [name] is feeling much improved cough and congestion have decreased.  Shortness of breath has improved.  [name] is trying to get back to being more active is currently walking on Samuel treadmill.  Has also been doing some breathing exercises.  Has been working with speech therapy.  We discussed his test results in detail.  Baseline get winded with activity.  We discussed pulmonary rehab, very interested in this.  Has a few days of prednisone  left. Remains on Breztri  Twice daily. Remains on O2 2l/m with activity and At bedtime    ILD /Hx of Drug induced Pneumonitis -recent flare-finish steroids. Refer to pulmonary rehab. Change HRCT for 3 months.    Asthma-recent flare now improving -finish steroids    O2 RF -continue on O2 2l/m with activity and At bedtime  SLP notes (multiple)  Reports overall improvement of voice quality and reduction of chronic cough/throat clearing behaviors. Endorses positive changes since beginning IMST exercises.    Objective Measurements: Pt exhibits occasional throat clearing, but successfully demonstrates cough suppression techniques and abdominal breathing pattern. Achieves clear and strong voicing in unstructured conversation, however is limited d/t poor respiratory support. He has increased MIP significantly over 2 weeks of completing IMST HEP.  Tanvir Hipple is a 68 y.o. male who presents as a return [name], for follow-up of sinusitis and globus sensation. At time of last visit on 03/14/2023, nasal culture was obtained following thick mucoid drainage noted in Samuel right nasal cavity. This was abnormal, and demonstrated gram-positive bacilli. [name] was subsequently treated with a 2-week course of oral antibiotics, and presents today for posttreatment imaging. He has continued to use proton pump inhibitor  twice daily, but continues to experience frequent throat clearing and globus sensation. He states he has been working on dietary and lifestyle modifications as previously instructed, and has been partially successful. He still has difficulty with frequent throat clearing and needing to cough often. He also has not been able to obtain humidification for his oxygen, as Samuel DME company stated they needed a prescription.    Last visit 03/14/2023: Tyaire Odem is a 68 y.o. male who presents as a new consult, referred by Malachy Crank [place] , FNP , for evaluation and treatment of sinus issues, hoarseness, and throat symptoms. He is accompanied by his wife.  [name] had history of multiple myeloma, status post treatment with chemotherapy. He developed interstitial lung disease following chemotherapeutic treatment, and is currently followed closely by pulmonology. He also endorses history of significant environmental allergies, and reports that recent allergy  testing showed positivity to everything. He was previously on immunotherapy, but this was held during his cancer treatment. He is considering starting back on Samuel chemotherapy due to Samuel significance of his symptoms. He is currently using Singulair , antihistamine and Patanase nasal spray on a daily basis. He also reports using nasal saline irrigations on a daily basis. He reports history of deviated nasal septum repair in 1983. He denies history of sinusitis requiring treatment with oral antibiotics in Samuel last 12 months. His primary symptoms are nasal congestion and postnasal drainage. He denies facial pressure, pain, hyposmia, hypogeusia. He has not had any imaging of his head or sinuses. He does occasionally experience headaches. He uses oxygen on a  nightly basis, and states that his oxygen is currently not humidified.  He reports symptoms of silent reflux, and states he is currently taking omeprazole  on a daily basis. [name] is frequent throat clearing  and globus sensation. He also reports occasional hoarseness.   A/P 09-24-23: Visited ED on 09/12/23 d/t shortness of breath. Began course of antibiotics for suspected bronchitis/pneumonia and symptoms have resolved. Despite recent respiratory challenges, pt reports wanting to continue IMST exercises as he has been completing daily HEP and feels it is significantly improving respiratory strength. Additionally, a friend provided positive affirmation of voice strength and quality over 2 hour conversation. In previous session, SLP set device to 26.25 cm H20 based on MIP calculation and instructed pt not to adjust it, however Samuel device was set to 33 cm H20 when pt entered. Reassessed MIP today across 3 trials: 80, 58, 75. Adjusted IMST device to 75% of MIP (80): 60 cm H20 (SLP set to max setting: 41 cm H20). Pt demo'd 25 reps with rare min A. Reviewed reflux precautions as pt endorses increased globus sensation/congestion in throat. Pt would like f/u with ENT for second opinion; requested today. Recommended inquiring about pulmonary rehab at upcoming appointment d/t success with IMST thus far. Re-assess MIP next session.    09-08-23: Continues to c/o significant asthma impacting respiratory function and subsequently voice. Initiated education and instruction of Administrator, arts (IMST) to address impaired breath support impacting vocal intensity and clarity. Measured MIP across 3 trials: 35, 29, 17. Modified provided IMST device to 75% of MIP (35): 26.25 cm H2O. Instructed [name] on how to use device and perform recommended exercises (see pt instructions for HEP). Pt able to demo 25 reps with occasional min A. Current device setting was deemed appropriate. Re-assess MIP next session.    08-26-23: Reports feeling under Samuel weather during Samuel past week and experiencing congestion from asthma today. SLP led discussion about preparing for work meetings occurring after ST. Recommended taking 5 to 10  minutes before meetings to reset breathing and prepare for prolonged speech. Pt identified x2 strategies to focus on during meeting with mod I. In 30 minute unstructured conversation, pt experienced occasional wet vocal quality, likely due to congestion, but independently self-corrected. Otherwise maintained strong breath support and clear vocal quality with rare min A and x3 cough/throat clears. Pt endorses overall improvements in conversational speech, but wants to continue targeting respiratory support.    08-12-23: Reports ongoing improvements in voice as well as breathing with less frequent cues provided by wife to use strategies. Continue to target use of diaphragmatic breathing, intentional upper body relaxation, and forward projection of voice in dual tasking scenarios. Pt aware of increased tension and reduced abdominal breathing during transition periods that include dividing attention between talking and other task (walking, answering phone). Focused on being intentional with relaxation and breath support to improve vocal quality and intensity during targeted dual tasking activities. Occasional min cues required to reduce tension, optimize breathing pattern, and project voice during these tasks.    08-06-23: Entered with hoarse vocal quality that improved independently, with pt stating that he focused on using forward resonance. Maintained relaxed appearance, strong breath support, and clear vocal quality during 20-minute unstructured conversation. SLP directed pt to walk down Samuel hall and return to therapy room to read a short passage x2 to target increased breath support and clear vocal quality in speech following exertion. Successfully completed task with occasional fading to rare min A, with improved breath support  and vocal quality noted in Samuel 2nd attempt. Benefited from cues to reset before beginning to read and take intentional pauses between sentences. Pt self-rated first attempt a 7.5 and  second attempt an 8.5-9 out of 10. Addressed breath support during exertion by completing walking and talking task. Pt maintained structured conversation while completing laps around therapy gym with occasional mod fading to min A to take breaths in between sentences and stay relaxed. Increased success noted in additional trial.    07-29-23: Reported ongoing HEP completion. Endorsed overall improvement of SOVT exercises, although some days are better than others. Targeted SOVTE to reduce vocal tension and improve clarity. Completed sustained exhale, hum, and glides into water with occasional min A to reduce upper body tension and take reset breaths. Introduced straw talking exercise with short phrases to improve forward resonance. Used negative practice to alternate between back-focused voice and forward resonance to improve awareness of vocal quality. Accurately read aloud a list of 8-9 word sentences using forward resonance with occasional min A. Back-focused voice more apparent at Samuel end of sentences. Benefited from cues to break up long sentences with a pause and reset with a straw or hum.   07-23-23: Entered with clear vocal quality and reported completing SOVT HEP intermittently. Targeted SOVT exercises to reduce tension in vocal folds and improve vocal quality. Aware he can sustain exhale longer than before during exercises (averaged 6 sec for breath only). Completed hum, accents, and happy birthday into water given occasional fading to rare min A to relax upper body tension and increase breath support. Maintained 15 minute conversation with clear vocal quality and minimal upper body tension independently. Pt endorsed increasing awareness of body tension in Samuel community; however, his wife occasionally cues him to relax which causes frustration. SLP counseled pt that Samuel strategies he is learning will take time to master in Samuel community. No coughing or throat clearing observed throughout session.  Past  Medical History:  Diagnosis Date   Asthma    GERD (gastroesophageal reflux disease)    High cholesterol    under control.    History of blood transfusion    Hypothyroidism    Interstitial lung disease (HCC)    Multiple myeloma (HCC)    Perennial allergic rhinitis    Pneumonia    Sciatic pain, right    Seasonal allergic rhinitis    Thyroid disease     Past Surgical History:  Procedure Laterality Date   BRONCHIAL BIOPSY  06/18/2021   Procedure: BRONCHIAL BIOPSIES;  Surgeon: Shelah Lamar RAMAN, MD;  Location: [facility];  Service: Cardiopulmonary;;   BRONCHIAL BIOPSY  08/28/2021   Procedure: BRONCHIAL BIOPSIES;  Surgeon: Brenna Adine CROME, DO;  Location: [facility];  Service: Cardiopulmonary;;   BRONCHIAL WASHINGS  06/18/2021   Procedure: BRONCHIAL WASHINGS;  Surgeon: Shelah Lamar RAMAN, MD;  Location: [facility];  Service: Cardiopulmonary;;   BRONCHIAL WASHINGS  08/28/2021   Procedure: BRONCHIAL WASHINGS;  Surgeon: Brenna Adine CROME, DO;  Location: [facility];  Service: Cardiopulmonary;;   NASAL SINUS SURGERY     NECK SURGERY  1996   RIGHT HEART CATH N/A 01/23/2024   Procedure: RIGHT HEART CATH;  Surgeon: Elmira Newman PARAS, MD;  Location: [facility];  Service: Cardiovascular;  Laterality: N/A;   VIDEO BRONCHOSCOPY N/A 06/18/2021   Procedure: VIDEO BRONCHOSCOPY WITH FLUORO;  Surgeon: Shelah Lamar RAMAN, MD;  Location: [facility];  Service: Cardiopulmonary;  Laterality: N/A;   VIDEO BRONCHOSCOPY N/A 08/28/2021   Procedure: VIDEO BRONCHOSCOPY  WITH FLUORO;  Surgeon: Brenna Adine CROME, DO;  Location: [facility];  Service: Cardiopulmonary;  Laterality: N/A;    Family History  Problem Relation Age of Onset   Hypertension Mother    Allergies Mother    Allergies Father    CAD Father 78   Breast cancer Paternal Grandmother    Hypertension Other    Heart disease Other     Social History:  reports that he quit smoking about 41 years ago. His smoking use included cigarettes. He  started smoking about 44 years ago. He has a 0.3 pack-year smoking history. He has never used smokeless tobacco. He reports that he does not currently use alcohol. He reports that he does not use drugs.  Allergies:  Allergies  Allergen Reactions   Clarithromycin Other (See Comments)    Hiccups   Ofev  [Nintedanib] Other (See Comments)    GI bleeding   Penicillins Other (See Comments)    Child hood unsure   Olga [Treprostinil Sodium] Cough    Bronchial spasms, sore throat.    Medications: I have reviewed Samuel [name]'s current medications.  Samuel PMH, PSH, Medications, Allergies, and SH were reviewed and updated.  ROS: Constitutional: Negative for fever, weight loss and weight gain. Cardiovascular: Negative for chest pain and dyspnea on exertion. Respiratory: Is not experiencing shortness of breath at rest. Gastrointestinal: Negative for nausea and vomiting. Neurological: Negative for headaches. Psychiatric: Samuel [name] is not nervous/anxious  Blood pressure 137/86, pulse 63, SpO2 96%.  PHYSICAL EXAM:  Exam: General: Well-developed, well-nourished Respiratory Respiratory effort: Equal inspiration and expiration without stridor Cardiovascular Peripheral Vascular: Warm extremities with equal color/perfusion Eyes: No nystagmus with equal extraocular motion bilaterally Neuro/Psych/Balance: [name] oriented to person, place, and time; Appropriate mood and affect; Gait is intact with no imbalance; Cranial nerves I-XII are intact Head and Face Inspection: Normocephalic and atraumatic without mass or lesion Palpation: Facial skeleton intact without bony stepoffs Salivary Glands: No mass or tenderness  Facial Strength: Facial motility symmetric and full bilaterally ENT Pinna: External ear intact and fully developed External canal: Canal is patent with intact skin Tympanic Membrane: Clear and mobile External Nose: No scar or anatomic deformity nasal cannula with O2  supplementation in place Internal Nose: Septum is relatively straight on anterior rhinoscopy. Bilateral inferior turbinate hypertrophy.  Lips, Teeth, and gums: Mucosa and teeth intact and viable Oral cavity/oropharynx: No erythema or exudate, no lesions present Neck Neck and Trachea: Midline trachea without mass or lesion Thyroid: No mass or nodularity Lymphatics: No lymphadenopathy   Studies Reviewed: CT chest 09/12/23 IMPRESSION: 1. No pulmonary embolus. 2. Lower lung volumes from prior CT. Chronic interstitial lung disease. Mild diffuse ground-glass opacity, particularly in Samuel lung bases, increased from prior. This may represent superimposed infection or edema. 3. Small presumed myelomatous lesions in Samuel posterior left sixth rib and left inferior scapula. These are unchanged from prior. 4. Coronary artery calcifications.  MBS/esophagram 11/05/23 CLINICAL DATA:  [name] with a history of multiple myeloma with interstitial lung disease presents today with coughing during meals, raspy voice and globus sensation  Esophagram: IMPRESSION: Mild dysmotility with intermittent tertiary contractions. Otherwise normal study.  MBS notes  Pt is a 68 yo male referred for OP MBS by ENT. 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 There was also pooling of secretions in his pyriform sinuses (L>R). He has been working with OP SLP for dysphonia, frequent throat clearing. He feels like he is getting improvement using his IMST  specifically. PMH includes: ILD (intermittent use of oxygen, has been using more consistently lately), globus sensation, chronic cough, GERD, multiple myeloma, former smoker, neck surgery, allergies     Pt's oropharyngeal swallow is functional with adequate efficiency and safety. He has trace amounts of residue in his pyriform sinuses after Samuel swallow, noted more with liquids than with solids. He does not have a  lot of anterior hyoid excursion but he has no other overt pharyngeal deficits. No penetration or aspiration observed. Note that pt did report feeling like Samuel purees and solids got stuck in his throat, which elicited coughing and throat clearing in an attempt to clear. When correlated with imaging, there was no residual in his pharynx. Question a referrred sensation and primary esophageal component (pt having esophagram after this study). Would continue regular solids and thin liquids with use of esophageal precautions. Would defer f/u to primary OP SLP.  CT chest 12/24/23 IMPRESSION: 1. Pulmonary parenchymal pattern of interstitial lung disease, as detailed above, stable from 09/12/2023 but progressive from 07/30/2021. 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 Findings are indeterminate for UIP per consensus guidelines: Diagnosis of Idiopathic Pulmonary Fibrosis: An Official ATS/ERS/JRS/ALAT Clinical Practice Guideline. Am [employer] Crit Care Med Vol 198, Iss 5, [id number], Jun 21 2017. 2. Aortic atherosclerosis (ICD10-I70.0). Coronary artery calcification. 3. Enlarged pulmonic trunk, indicative of pulmonary arterial hypertension.  Assessment/Plan: Encounter Diagnoses  Name Primary?   Chronic GERD Yes   Chronic cough    Chronic nasal congestion    Environmental and seasonal allergies    Post-nasal drip    Oxygen dependent    History of chronic lung disease     Assessment and Plan    Chronic Cough and Throat Clearing Chronic cough and throat clearing, likely multifactorial including post-nasal drip, chronic GERD, and vocal fold atrophy. Symptoms exacerbated by interstitial lung disease and nasal cannula O2 requirement. Examination revealed pooling of secretions in pyriform sinuses concerning for dysphagia. 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  Discussed voice therapy as first-line therapy. Reviewed potential benefits and risks of  current medications and new treatments such as steroid nasal rinses and switching antihistamines. - Order swallow study to evaluate for dysphagia (MBS esophagram) - Refer to speech therapy for voice therapy for vocal fold atrophy glottic insufficiency - Continue Azelastine  for chronic nasal congestion - Switch from Claritin to Zyrtec  - Add budesonide or mometasone to nasal rinses - Use Vaseline to prevent nasal dryness - Continue omeprazole  and reflux raft, especially at night - Continue Delsym and Mucinex as needed for cough  Chronic Nasal Congestion Post-Nasal Drip Post-nasal drip contributing to chronic cough and throat clearing. Examination showed clear drainage in Samuel back of Samuel nose and nasal irritation likely due to allergies and O2 supplementation via nasal cannula. Discussed benefits of saline nasal rinses and potential for steroid nasal rinses to cover a larger surface area. Informed about slight risk of epistaxis with oxygen therapy and importance of using Vaseline to prevent nasal dryness. - Continue saline nasal rinses - Add budesonide or mometasone to nasal rinses - Switch from Claritin to Zyrtec  - Continue azelastine  2 puffs b/l nares twice daily - Use Vaseline to prevent nasal dryness  Chronic Gastroesophageal Reflux Disease (GERD) GERD contributing to chronic throat clearing and cough.  Discussed importance of taking omeprazole  and reflux raft, especially at night, and potential benefits of using reflux raft after large meals during Samuel day. 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 Examination revealed age-appropriate thinning of Samuel vocal folds but vocal folds were mobile without masses or lesions.  - Refer to speech therapy for voice  therapy   Follow-up - Schedule follow-up after swallow study and voice therapy - Schedule MBS esophagram  Last OV Assessment and Plan    Chronic Throat clearing  and Phlegm Sensation/Globus, chronic cough  Persistent sensation of phlegm in Samuel throat, difficulty expelling secretions, and intermittent voice changes. Some improvement with speech therapy and reflux management. Examination shows clear secretions in both pyriforms, VF atrophy and supraglottic compression, and mild findings c/w GERD LPR. Differential includes postnasal drainage and reflux changes triggering issues with phlegm and weaker cough strength 2/2 hx of poor lung function. Discussed FEES exam to evaluate swallowing function and secretions (his MBS showed normal oropharyngeal swallow and esophagram showed mild dysmotility but no other findings). Explained that FEES involves feeding colored substances while a scope is in Samuel nose to observe if secretions get caught anywhere. FEES could help determine if Samuel sensation of things getting caught is real or perceived. Treatments for weak cough due to lung disease include injection augmentation, but these may not be ideal due to his poor lung function. Emphasized management of postnasal drainage and reflux. - Refer for FEES exam - Continue reflux management with Reflux Raft after each meal and Prilosec - Continue Flonase and antihistamine (Claritin or Zyrtec ) - Continue nasal saline rinses (ok to hold off on adding steroids and do rinses with salt alone) - Follow up in April after lung CT/Pulm visit and FEES results  Dysphagia, coughing with liquids  Mild esophageal dysmotility noted on esophagram, with normal swallow function on MBS. Symptoms may contribute to sensation of food or secretions getting caught in Samuel throat. - Monitor symptoms - Continue current management strategies for GER - FEES  Interstitial Lung Disease Ongoing management by pulmonologist. Recent exacerbations  and history of bronchitis. Lung function impacts ability to cough/expel secretions effectively. Awaiting results of lung CT scan in March. Weak cough due to lung disease affects ability to expel secretions. - Review lung CT results in April follow-up - Coordinate care with pulmonologist Dr. Bubba  General Health Maintenance Emphasized importance of continuing current medications and therapies to manage symptoms. - Continue Reflux Raft after each meal - Continue Flonase and antihistamine (Claritin or Zyrtec ) - Continue nasal saline rinses  Dysphonia, VF atrophy/glottic insufficiency on videostrobe today  - continue speech therapy  - will consider VF injection augmentation if warranted in Samuel future   Follow-up - Schedule follow-up appointment in April   Update 05/11/2024 Assessment and Plan Assessment & Plan Chronic Cough Chronic cough possibly related to lung disease but could also  be due to postnasal drainage, GERD LPR . Normal swallow study excludes aspiration. Reflux raft beneficial for reflux management. Overall he feels Samuel cough is better. His main issue now is trouble with taking deep breaths and nasal obstruction - Continue Allegra at night, azelastine , and Flonase. - Continue reflux raft. - Encourage nasal saline rinses. - Consider steroid nasal rinses more frequently than three times weekly and nasal saline rinses daily   Chronic Nasal Congestion Nasal obstruction Nasal congestion likely due to postnasal drainage from allergies, vasomotor rhinitis, or nasal cannula oxygen use. Slight septal deviation and turbinate hypertrophy noted, but his sx are most  likely related to ongoing chronic nasal inflammation. Surgical intervention not recommended 2/2 lung disease and risk of anesthesia, and a need to control allergies and post-nasal drainage even if surgery is performed. We discussed that septo/ITR will not provide complete sx relief. We also discussed that lung disease could be  playing a role in Samuel sensation of having trouble taking deep enough breaths - Encourage regular nasal saline rinses. - Consider steroid nasal rinses more frequently than three times weekly.  GERD LPR Reflux raft improved symptoms, possibly contributing to cough and throat clearing. Contains melatonin for nighttime reflux control. - Continue reflux raft. - diet and lifestyle changes to minimize reflux  - continue Omeprazole   Pulmonary hypertension and ILD Borderline condition. Eutrepia trial stopped due to adverse effects linked to asthma. - Discuss management options with Dr. Bubba.  Oxygen therapy Increased oxygen requirement. Humidifier used to alleviate dryness. Congestion possibly related to therapy. - Continue using humidifier with oxygen concentrator.   I spent 30 minutes in total face-to-face time and in reviewing records during pre-charting, more than 50% of which was spent in counseling and coordination of care, reviewing test results, reviewing medications and treatment regimen and/or in discussing or reviewing Samuel diagnosis, Samuel prognosis and treatment options. Pertinent laboratory and imaging test results that were available during this visit with Samuel [name] were reviewed by me and considered in my medical decision making (see chart for details).   RTC 3 mo  Elena Larry, MD Otolaryngology [facility] Phone: [phone] Fax: [phone]    05/11/2024, 9:10 AM

## 2024-05-11 NOTE — Patient Instructions (Incomplete)
 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 Mean pressure Hypertensio with [name] at dose iniitation - May 2025 Bronchospasm with Olga -July 2025  Plan  - Marked treprostinil DPI and Yutrepia as allergy  -Start treprostinil nebulizer [message sent to pharmacy team]  ILD (interstitial lung disease) (HCC) Drug-induced pneumonitis  =- Fibrosis is worse;  - intolerance to anti fibrotics esbreit and ofev   Plan - supportive care - Start empiric prednisone  10 mg/day - await Nerandomilast approval  Chronic respiratory failure  -Progressively getting worse  Plan  - continue o2 as before [4 L at rest, 10 L with exertion]  Myeloma    M protein < 0.8 in July 2024 and 0.5 in March 2025 and 0.6 in April 2025 and 0.7 in July 2025  Plan  - per [facility] and Dr Federico - Hopefully empiric prednisone  10 mg/day can help with this  Anxiety Chronic cough Class IV dyspnea  - affecting quality of life but glad you are now talking about it and addressing it  Plan  - meditation + acupuncuture  - continue Xanax  - -Continue Hycodan cough syrup 5 mg x 6  times daily as needed - Nexk step consider scheduled oral morphine syrup  Goals of care  -Life expectancy is diminished and we talked about it  Plan  -Focus on quality of life issues and treat the treatable - Optimize to the extent that you can still continue to work  [employer] 4 weeks video visit 4:30 PM with Dr. Geronimo or nurse practitioner to address continued quality-of-life issues

## 2024-05-11 NOTE — Progress Notes (Unsigned)
 OV 05/12/2024  Subjective:  Patient ID: Samuel [name], male , DOB: May 05, 1956 , age 68 y.o. , MRN: [id number] , ADDRESS: [address] [place] [address] PCP Seabron Lenis, MD Patient Care Team: Seabron Lenis, MD as PCP - General (Family Medicine) Elmira Newman JINNY, MD as PCP - Cardiology (Cardiology)  This Provider for this visit: Treatment Team:  Attending Provider: Geronimo Amel, MD    Follow up : ILD , drug-induced pneumonitis, chronic respiratory failure, asthma Esbriet  stopped in October 2023 due to inability to tolerate due to GI side effects   Ofev  spped in 2022 -intolerant  Esbriet  stopped in October 2023 due to inability to tolerate due to GI side effects   # IgG Kappa Multiple Myeloma 02/22/2021: bone marrow biopsy confirms the diagnosis of Multiple Myeloma with a monoclonal plasma cell population.   # History of asthma.  Mid February 2020 for mildly elevated IgE and dust mite allergy .  On RAST allergy  panel.  #*WHO group 3 pulmonary hypertension mean nocturnal artery pressure of 21 diagnosed April 2025 0  > started DPI treprostinil Mar 11, 2024  -intolerant by June 2025 because of hypertension > Started YUTPREIA -but intolerant by July 2025 because of bronchospasm  05/12/2024 -   Chief Complaint  Patient presents with   Medical Management of Chronic Issues    ILD- increased SOB, cough since last visit. He has had asthma like symptoms- bronchospasms. Stopped Yutrepia 4 days ago.      HPI Samuel [name] 68 y.o. -returns for follow-up.  He was on inhaled treprostinil DPI but did not tolerated.  We then put him on the new 3 technology version of treprostinil call YUTREPIA but he developed bronchospasm.  He is not able to tolerate it.  He is now interested in the treprostinil nebulizer.  But I did indicate to him and his wife that very likely will have side effects but it is definitely worth trying but pending insurance approval.  He is interested  in clinical trials but his myeloma is an exclusion.  He is now significantly more hypoxemic he is requiring 4 L at rest.  There is a easily desaturates on room air at rest.  He and his wife had several questions - He says the main thing is for him to control his air hunger or calm his breathing.  He says anxiety makes it worse meditation and acupuncture makes it better anxiolytic such as Xanax  also makes it better.  We discussed about morphine as a palliative relief for dyspnea.  He of note he is already on Hycodan for cough.  He also brought up the idea of chronic daily prednisone  which apparently is a last resort and myeloma.  I said I was open to this idea particularly to quell inflammation and also to see if it will help with his dyspnea and cough.  Therefore we decided to prescribe this.  Nerandomilast might be approved in 6 months but he has significant fibrosis at this point in time.  I did discuss about this medication as well   - Chronic cough did refill on Hycodan  -Goals of care and life expectancy: He wants to continue to work he continues to work actually.  He spends a few hours in the office.  Despite all his health issues.  Did indicate that prognosis is guarded and life expectancy could be up to 1 year.  He struggling between myeloma and also fibrosis which is progressive.  SYMPTOM SCALE - ILD 08/09/2021 09/25/2021 218# 12/04/2021 222# 01/24/22 222# 04/09/2022 215# 06/06/2022 211# 06/12/2023 205# 09/12/2023  05/12/2024 Using 4L rest, 10L exertion. RA is 84% Rest  Current weight    Treadmill 2.2 mph.  As 1.4 miles over 40 minutes.  Uses 8 L oxygen Post may 2nd visit 03/27/22 -2ndcvoid      O2 use ra ra ra ra 2L eert 2L exert O2 with exertion    Shortness of Breath 0 -> 5 scale with 5 being worst (score 6 If unable to do)          At rest 0 0 0 0 0 4 0.5 2   Simple tasks - showers, clothes change, eating, shaving 2 2 1  0/5 0 3 2 3    Household (dishes, doing bed, laundry) x na  1 1 1 3 3 4    Shopping 3 1 1.5 0/5 1 3  0.5 3   Walking level at own pace 4 2 2 1 1 2  2.5 4   Walking up Stairs 5 4 3  3.5 3 2 5 5    Total (30-36) Dyspnea Score 14 9 8.5 6 6 17  14.5 21   How bad is your cough? 3 1 0.5  3.4 5 Only iwht acid reflux 4   How bad is your fatigue 0 2 0  3   3   How bad is nausea 0 0 0  0 5 0 1   How bad is vomiting?  0 0 0  0 0 0 0   How bad is diarrhea? 0 0 0  0 4 0 1   How bad is anxiety? 5 2 1   0.5 0 6 3   How bad is depression 2 1 1   0.5 0 2 3   Any chronic pain - if so where and how bad x x x  x  x 0      Simple office walk 185 feet x  3 laps goal with forehead probe 07/12/2021  08/09/2021  09/25/2021  12/04/2021  01/24/2022  04/09/2022  06/06/2022  12/26/2022  06/12/2023 09/12/2023   O2 used ra Ra3 ra ra ra ra ra ra ra  Number laps completed 3 3 but did oly 2 3 3  attempted but stopped at 2 due to deats All 3 las Stopped at [employer] all 3 Stopped at 2 1 lap and even that in half away  Comments about pace slow slow slow slow Avg pace Avg  slow slow   Resting Pulse Ox/HR 100% and 69/min 100%ad 74 98% and 75 99% RA and59 98% and HR 60 98% and HR 54 98% nand HR 90 98% n HR 73 100% and HR 64  Final Pulse Ox/HR 92% and 81/min 93% and 87 91% and 91 86% RA and 85 91% and HR 80 (brief 89%) 88% and HR 80 91% and HR 107 90% an HR 79 92% and HR 67 - half lap  Desaturated </= 88% no no no yes ni  no    Desaturated <= 3% points Yes 8 Yes, 7 pponts Yes, 7 points Yes, 13 poins Yes 7 points Yes, 10 points Yes 7 poin    Got Tachycardic >/= 90/min no no yes no no  uyes    Symptoms at end of test No complaints Mod-severe dyspnea Mild dyspnea No complaints none Seemed ok Mild dyspnea    Miscellaneous comments x Worse? stable ? worse     Reduced efforr tolerance  PFT     Latest Ref Rng & Units 06/12/2023   11:30 AM 01/17/2023    2:09 PM 09/26/2022   10:34 AM 08/01/2022    1:57 PM 06/06/2022    1:52 PM 12/10/2021    9:24 AM 10/16/2021    3:56 PM  PFT Results   FVC-Pre L 1.19  1.36  1.32  1.55  1.43  1.49  1.35   FVC-Predicted Pre % 26  29  28   33  30  32  29   Pre FEV1/FVC % % 88  90  85  94  88  94  92   FEV1-Pre L 1.05  1.23  1.12  1.46  1.26  1.40  1.24   FEV1-Predicted Pre % 30  36  32  42  36  40  36   DLCO uncorrected ml/min/mmHg 9.36  12.92  9.61  10.43  13.19  8.64  9.99   DLCO UNC% % 35  48  36  39  49  32  37   DLCO corrected ml/min/mmHg 9.36  13.27  9.96  10.58  13.77  9.20  10.04   DLCO COR %Predicted % 35  49  37  39  51  34  37   DLVA Predicted % 126  127  107  110  128  97  109        LAB RESULTS last 96 hours No results found.       has a past medical history of Asthma, GERD (gastroesophageal reflux disease), High cholesterol, History of blood transfusion, Hypothyroidism, Interstitial lung disease (HCC), Multiple myeloma (HCC), Perennial allergic rhinitis, Pneumonia, Sciatic pain, right, Seasonal allergic rhinitis, and Thyroid disease.   reports that he quit smoking about 41 years ago. His smoking use included cigarettes. He started smoking about 44 years ago. He has a 0.3 pack-year smoking history. He has never used smokeless tobacco.  Past Surgical History:  Procedure Laterality Date   BRONCHIAL BIOPSY  06/18/2021   Procedure: BRONCHIAL BIOPSIES;  Surgeon: Shelah Lamar RAMAN, MD;  Location: [facility];  Service: Cardiopulmonary;;   BRONCHIAL BIOPSY  08/28/2021   Procedure: BRONCHIAL BIOPSIES;  Surgeon: Brenna Adine CROME, DO;  Location: [facility];  Service: Cardiopulmonary;;   BRONCHIAL WASHINGS  06/18/2021   Procedure: BRONCHIAL WASHINGS;  Surgeon: Shelah Lamar RAMAN, MD;  Location: [facility];  Service: Cardiopulmonary;;   BRONCHIAL WASHINGS  08/28/2021   Procedure: BRONCHIAL WASHINGS;  Surgeon: Brenna Adine CROME, DO;  Location: [facility];  Service: Cardiopulmonary;;   NASAL SINUS SURGERY     NECK SURGERY  1996   RIGHT HEART CATH N/A 01/23/2024   Procedure: RIGHT HEART CATH;  Surgeon: Elmira Newman PARAS, MD;   Location: [facility];  Service: Cardiovascular;  Laterality: N/A;   VIDEO BRONCHOSCOPY N/A 06/18/2021   Procedure: VIDEO BRONCHOSCOPY WITH FLUORO;  Surgeon: Shelah Lamar RAMAN, MD;  Location: [facility];  Service: Cardiopulmonary;  Laterality: N/A;   VIDEO BRONCHOSCOPY N/A 08/28/2021   Procedure: VIDEO BRONCHOSCOPY WITH FLUORO;  Surgeon: Brenna Adine CROME, DO;  Location: [facility];  Service: Cardiopulmonary;  Laterality: N/A;    Allergies  Allergen Reactions   Amlodipine  Swelling   Beclomethasone Other (See Comments)    unknown   Ciclesonide Other (See Comments)    unknown   Clarithromycin Other (See Comments)    Hiccups   Ofev  [Nintedanib] Other (See Comments)    GI bleeding   Penicillins Other (See Comments)    Child hood unsure  Rosuvastatin      Other Reaction(s): hand swelling   Trazodone     Other Reaction(s): sweating   Olga [Treprostinil Sodium] Cough    Bronchial spasms, sore throat.    Immunization History  Administered Date(s) Administered   Fluad Quad(high Dose 65+) 08/09/2021, 08/01/2022, 07/22/2023   Fluzone Influenza virus vaccine,trivalent (IIV3), split virus 10/27/2017   Influenza, High Dose Seasonal PF 12/01/2017, 11/29/2019, 12/19/2020, 12/24/2021   Influenza, Quadrivalent, Recombinant, Inj, Pf 07/28/2019, 08/11/2020   Influenza-Unspecified 11/26/2011, 07/21/2017   PFIZER(Purple Top)SARS-COV-2 Vaccination 12/27/2019, 01/24/2020, 09/21/2020   Tdap 08/20/2005, 09/07/2015   Zoster, Live 11/06/2017, 05/07/2018    Family History  Problem Relation Age of Onset   Hypertension Mother    Allergies Mother    Allergies Father    CAD Father 32   Breast cancer Paternal Grandmother    Hypertension Other    Heart disease Other      Current Outpatient Medications:    acyclovir  (ZOVIRAX ) 400 MG tablet, Take 1 tablet (400 mg total) by mouth 2 (two) times daily., Disp: 60 tablet, Rfl: 3   albuterol  (PROVENTIL ) (2.5 MG/3ML) 0.083% nebulizer solution,  USE ONE VIAL IN NEBULIZER EVERY 6 HOURS AS NEEDED WHEEZING OR SHORTNESS OF BREATH, Disp: 120 mL, Rfl: 12   albuterol  (VENTOLIN  HFA) 108 (90 Base) MCG/ACT inhaler, Inhale 2 puffs into the lungs every 6 hours as needed for wheezing or shortness of breath., Disp: 8.5 g, Rfl: 1   ALPRAZolam  (XANAX ) 0.5 MG tablet, Take 0.5 mg by mouth at bedtime., Disp: , Rfl:    ASSESS FULL RANGE PEAK METER DEVI, as directed., Disp: , Rfl:    azelastine  (ASTELIN ) 0.1 % nasal spray, Place 2 sprays into both nostrils 2 (two) times daily. Use in each nostril as directed, Disp: 30 mL, Rfl: 12   bisoprolol  (ZEBETA ) 5 MG tablet, Take 1 tablet (5 mg total) by mouth daily., Disp: 30 tablet, Rfl: 2   Budeson-Glycopyrrol-Formoterol (BREZTRI  AEROSPHERE) 160-9-4.8 MCG/ACT AERO, Inhale 2 puffs into the lungs in the morning and at bedtime., Disp: 10.7 g, Rfl: 11   cetirizine  (ZYRTEC ) 10 MG tablet, Take 1 tablet (10 mg total) by mouth daily., Disp: 30 tablet, Rfl: 11   cholecalciferol (VITAMIN D) 1000 UNITS tablet, Take 3,000 Units by mouth daily., Disp: , Rfl:    EPINEPHrine  (EPI-PEN) 0.3 mg/0.3 mL DEVI, Inject 0.3 mg into the muscle as needed., Disp: , Rfl:    ezetimibe -simvastatin  (VYTORIN ) 10-40 MG tablet, Take 1 tablet by mouth daily., Disp: 90 tablet, Rfl: 3   fluticasone  (FLONASE) 50 MCG/ACT nasal spray, Place 1 spray into both nostrils daily as needed for allergies or rhinitis., Disp: , Rfl:    ibuprofen (ADVIL) 200 MG tablet, Take 200 mg by mouth every 6 (six) hours as needed for moderate pain (pain score 4-6)., Disp: , Rfl:    ketoconazole (NIZORAL) 2 % cream, Apply 1 Application topically 2 (two) times daily as needed for irritation., Disp: , Rfl:    levothyroxine  (SYNTHROID ) 50 MCG tablet, Take 50 mcg by mouth every morning., Disp: , Rfl:    liothyronine (CYTOMEL) 5 MCG tablet, Take 5 mcg by mouth daily., Disp: , Rfl:    Multiple Vitamin (MULTIVITAMIN) tablet, Take 1 tablet by mouth daily., Disp: , Rfl:    omeprazole   (PRILOSEC) 40 MG capsule, Take 1 capsule (40 mg total) by mouth 2 (two) times daily. Take 30 minutes before a meal (Patient taking differently: Take 40 mg by mouth daily. Take 30 minutes before a meal), Disp:  60 capsule, Rfl: 3   OXYGEN, Inhale 2 L into the lungs continuous., Disp: , Rfl:    Simethicone  (SIMETHICONE  ULTRA STRENGTH) 180 MG CAPS, Take 1 capsule (180 mg total) by mouth 3 (three) times daily as needed., Disp: 90 capsule, Rfl: 0   sodium chloride  (OCEAN) 0.65 % SOLN nasal spray, Place 1 spray into both nostrils as needed for congestion., Disp: , Rfl:    tamsulosin  (FLOMAX ) 0.4 MG CAPS capsule, Take 0.4 mg by mouth daily., Disp: , Rfl:    triamcinolone  cream (KENALOG ) 0.1 %, Apply 1 application topically daily as needed (sun burn itch)., Disp: , Rfl:    Turmeric 400 MG CAPS, Take 1 capsule by mouth daily., Disp: , Rfl:    amLODipine  (NORVASC ) 5 MG tablet, Take 1 tablet (5 mg total) by mouth daily., Disp: 90 tablet, Rfl: 3   dextromethorphan (DELSYM) 30 MG/5ML liquid, Take 30 mg by mouth as needed for cough., Disp: , Rfl:    [START ON 07/11/2024] HYDROcodone  bit-homatropine (HYCODAN) 5-1.5 MG/5ML syrup, Take 5 mLs by mouth every 4 (four) hours as needed for cough., Disp: 900 mL, Rfl: 0   [START ON 06/11/2024] HYDROcodone  bit-homatropine (HYCODAN) 5-1.5 MG/5ML syrup, Take 5 mLs by mouth every 4 (four) hours as needed for cough., Disp: 900 mL, Rfl: 0   montelukast  (SINGULAIR ) 10 MG tablet, Take 10 mg by mouth at bedtime. (Patient not taking: Reported on 05/12/2024), Disp: , Rfl:    sildenafil (REVATIO) 20 MG tablet, Take 20 mg by mouth daily as needed (ED)., Disp: , Rfl:       Objective:   Vitals:   05/12/24 0829  BP: 124/74  Pulse: 70  SpO2: 100%    Estimated body mass index is 28.28 kg/m as calculated from the following:   Height as of 03/19/24: 5' 10 (1.778 m).   Weight as of an earlier encounter on 05/12/24: 197 lb 1.6 oz (89.4 kg).  @WEIGHTCHANGE @  There were no vitals filed  for this visit.   Physical Exam   General: No distress. Look slolwy worse O2 at rest: yes Cane present: no Sitting in wheel chair: YES Frail: no Obese: no Neuro: Alert and Oriented x 3. GCS 15. Speech normal Psych: Pleasant Resp:  Barrel Chest - no.  Wheeze - no, Crackles - YES DIFFUSE, No overt respiratory distress CVS: Normal heart sounds. 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 PEERL +. No post nasal drip        Assessment/PLAN       ICD-10-CM   1. ILD (interstitial lung disease) (HCC)  J84.9     2. WHO group 3 pulmonary arterial hypertension (HCC)  I27.23     3. Chronic respiratory failure with hypoxia (HCC)  J96.11     4. Dyspnea on exertion  R06.09     5. Chronic cough  R05.3     6. Goals of care, counseling/discussion  Z71.89     7. Multiple myeloma, remission status unspecified (HCC)  C90.00       Assessment & Plan ILD (interstitial lung disease) (HCC)  WHO group 3 pulmonary arterial hypertension (HCC)  Chronic respiratory failure with hypoxia (HCC)  Dyspnea on exertion  Chronic cough  Goals of care, counseling/discussion  Multiple myeloma, remission status unspecified (HCC)        Plan:     Patient Instructions  WHO-3 Pulmonary Hypertension - new diagnosis 01/23/24. Mean pressure Hypertensio with [name] at dose iniitation -  May 2025 Bronchospasm with Olga -July 2025  Plan  - Marked treprostinil DPI and Yutrepia as allergy  -Start treprostinil nebulizer [message sent to pharmacy team]  ILD (interstitial lung disease) (HCC) Drug-induced pneumonitis  =- Fibrosis is worse;  - intolerance to anti fibrotics esbreit and ofev   Plan - supportive care - Start empiric prednisone  10 mg/day - await Nerandomilast approval  Chronic respiratory failure  -Progressively getting worse  Plan  - continue o2 as before [4 L at rest, 10 L with exertion]  Myeloma    M protein < 0.8 in  July 2024 and 0.5 in March 2025 and 0.6 in April 2025 and 0.7 in July 2025  Plan  - per [facility] and Dr Federico - Hopefully empiric prednisone  10 mg/day can help with this  Anxiety Chronic cough Class IV dyspnea  - affecting quality of life but glad you are now talking about it and addressing it  Plan  - meditation + acupuncuture  - continue Xanax  - -Continue Hycodan cough syrup 5 mg x 6  times daily as needed - Nexk step consider scheduled oral morphine syrup  Goals of care  -Life expectancy is diminished and we talked about it  Plan  -Focus on quality of life issues and treat the treatable - Optimize to the extent that you can still continue to work  [employer] 4 weeks video visit 4:30 PM with Dr. Geronimo or nurse practitioner to address continued quality-of-life issues   FOLLOWUP Return in about 4 weeks (around 06/09/2024) for 30 min visit, with any of the APPS, with Dr Geronimo, Video Visit 4.30pm slot.  ( Level 05 visit E&M 2024: Estb >= 40 min  visit type: on-site physical face to visit  in total care time and counseling or/and coordination of care by this undersigned MD - Dr Dorethia Geronimo. This includes one or more of the following on this same day 05/12/2024: pre-charting, chart review, note writing, documentation discussion of test results, diagnostic or treatment recommendations, prognosis, risks and benefits of management options, instructions, education, compliance or risk-factor reduction. It excludes time spent by the CMA or office staff in the care of the patient. Actual time 40 min)   SIGNATURE    Dr. Dorethia Geronimo, M.D., F.C.C.P,  Pulmonary and Critical Care Medicine Staff Physician, [facility] Center Director - Interstitial Lung Disease  Program  Pulmonary Fibrosis [facility] at [facility] [place], [place], [address]  Pager: [phone], If no answer or between  15:00h - 7:00h: call [phone] Telephone: [phone]  7:05 PM 05/12/2024

## 2024-05-11 NOTE — Telephone Encounter (Signed)
 I already informed the pt to stop yutrepia for now  The nurse called with this info as just FYI since he has appt tomorrow

## 2024-05-12 ENCOUNTER — Telehealth: Payer: Self-pay | Admitting: Internal Medicine

## 2024-05-12 ENCOUNTER — Ambulatory Visit (INDEPENDENT_AMBULATORY_CARE_PROVIDER_SITE_OTHER): Admitting: Internal Medicine

## 2024-05-12 ENCOUNTER — Telehealth: Payer: Self-pay | Admitting: Pharmacist

## 2024-05-12 ENCOUNTER — Inpatient Hospital Stay (HOSPITAL_BASED_OUTPATIENT_CLINIC_OR_DEPARTMENT_OTHER): Admitting: Nurse Practitioner

## 2024-05-12 VITALS — BP 124/74 | HR 70

## 2024-05-12 VITALS — BP 148/94 | HR 65 | Temp 97.7°F | Resp 14 | Wt 197.1 lb

## 2024-05-12 DIAGNOSIS — Z87891 Personal history of nicotine dependence: Secondary | ICD-10-CM

## 2024-05-12 DIAGNOSIS — Z515 Encounter for palliative care: Secondary | ICD-10-CM

## 2024-05-12 DIAGNOSIS — Z7189 Other specified counseling: Secondary | ICD-10-CM | POA: Diagnosis not present

## 2024-05-12 DIAGNOSIS — J9611 Chronic respiratory failure with hypoxia: Secondary | ICD-10-CM

## 2024-05-12 DIAGNOSIS — C9000 Multiple myeloma not having achieved remission: Secondary | ICD-10-CM

## 2024-05-12 DIAGNOSIS — R530 Neoplastic (malignant) related fatigue: Secondary | ICD-10-CM | POA: Diagnosis not present

## 2024-05-12 DIAGNOSIS — R053 Chronic cough: Secondary | ICD-10-CM | POA: Diagnosis not present

## 2024-05-12 DIAGNOSIS — I2723 Pulmonary hypertension due to lung diseases and hypoxia: Secondary | ICD-10-CM | POA: Diagnosis not present

## 2024-05-12 DIAGNOSIS — R0602 Shortness of breath: Secondary | ICD-10-CM

## 2024-05-12 DIAGNOSIS — J849 Interstitial pulmonary disease, unspecified: Secondary | ICD-10-CM | POA: Diagnosis not present

## 2024-05-12 DIAGNOSIS — R0609 Other forms of dyspnea: Secondary | ICD-10-CM

## 2024-05-12 MED ORDER — HYDROCODONE BIT-HOMATROP MBR 5-1.5 MG/5ML PO SOLN: 5.0000 mL | ORAL | 0 refills | Status: DC | PRN

## 2024-05-12 NOTE — Telephone Encounter (Signed)
 Can try for [name] nebs but not confident he will be approved per Part B criteria for [name]. Enrollment form will be started  Thornton Dohrmann, PharmD, MPH, BCPS, CPP Clinical Pharmacist (Rheumatology and Pulmonology)

## 2024-05-12 NOTE — Telephone Encounter (Signed)
 DEvki/LEsli  [facility] allergy  for Olga and [name] DPI   Please start [name] NEB   THanks    SIGNATURE    Dr. Dorethia Cave, M.D., F.C.C.P,  Pulmonary and Critical Care Medicine Staff Physician, [facility] Center Director - Interstitial Lung Disease  Program  Pulmonary Fibrosis [facility] Network at [facility] [place], [place], [address]   Pager: [phone], If no answer  -> Check AMION or Try [phone] Telephone (clinical office): [phone] Telephone (research): [phone]  8:57 AM 05/12/2024

## 2024-05-12 NOTE — Progress Notes (Unsigned)
 Home wheelchair ordered via parachute/adapt, per Levon, NP orders.

## 2024-05-12 NOTE — Telephone Encounter (Signed)
 Submitted completed referral paperwork to [facility] for [name] NEBS along with HRCT, right heart cath results, and most recent progress note.  Fax# [phone] Phone# [phone]  Sherry Pennant, PharmD, MPH, BCPS, CPP Clinical Pharmacist (Rheumatology and Pulmonology)

## 2024-05-13 ENCOUNTER — Encounter: Payer: Self-pay | Admitting: Hematology and Oncology

## 2024-05-13 ENCOUNTER — Encounter: Payer: Self-pay | Admitting: Nurse Practitioner

## 2024-05-13 NOTE — Progress Notes (Signed)
 Palliative Medicine [facility]  [phone] [phone]   Name: Samuel [name] Date: 05/13/2024 MRN: [id number]  DOB: Jun 11, 1956  Patient Care Team: Seabron Lenis, MD as PCP - General (Family Medicine) Elmira Newman JINNY, MD as PCP - Cardiology (Cardiology)    REASON FOR CONSULTATION: Samuel [name] is a 68 y.o. male with oncologic medical history including multiple myeloma with multiple lytic lesions to thoracolumbar spine and bilateral pelvis, developed pulmonary fibrosis.  Palliative is seeing patient for symptom management and goals of care.    SOCIAL HISTORY:     reports that he quit smoking about 41 years ago. His smoking use included cigarettes. He started smoking about 44 years ago. He has a 0.3 pack-year smoking history. He has never used smokeless tobacco. He reports that he does not currently use alcohol. He reports that he does not use drugs.  ADVANCE DIRECTIVES:  Active, however not on file. Patient to bring in for filing.   CODE STATUS: DNR  PAST MEDICAL HISTORY: Past Medical History:  Diagnosis Date   Asthma    GERD (gastroesophageal reflux disease)    High cholesterol    under control.    History of blood transfusion    Hypothyroidism    Interstitial lung disease (HCC)    Multiple myeloma (HCC)    Perennial allergic rhinitis    Pneumonia    Sciatic pain, right    Seasonal allergic rhinitis    Thyroid disease     PAST SURGICAL HISTORY:  Past Surgical History:  Procedure Laterality Date   BRONCHIAL BIOPSY  06/18/2021   Procedure: BRONCHIAL BIOPSIES;  Surgeon: Shelah Lamar RAMAN, MD;  Location: [facility];  Service: Cardiopulmonary;;   BRONCHIAL BIOPSY  08/28/2021   Procedure: BRONCHIAL BIOPSIES;  Surgeon: Brenna Adine CROME, DO;  Location: [facility];  Service: Cardiopulmonary;;   BRONCHIAL WASHINGS  06/18/2021   Procedure: BRONCHIAL WASHINGS;  Surgeon: Shelah Lamar RAMAN, MD;  Location: [facility];  Service:  Cardiopulmonary;;   BRONCHIAL WASHINGS  08/28/2021   Procedure: BRONCHIAL WASHINGS;  Surgeon: Brenna Adine CROME, DO;  Location: [facility];  Service: Cardiopulmonary;;   NASAL SINUS SURGERY     NECK SURGERY  1996   RIGHT HEART CATH N/A 01/23/2024   Procedure: RIGHT HEART CATH;  Surgeon: Elmira Newman JINNY, MD;  Location: [facility];  Service: Cardiovascular;  Laterality: N/A;   VIDEO BRONCHOSCOPY N/A 06/18/2021   Procedure: VIDEO BRONCHOSCOPY WITH FLUORO;  Surgeon: Shelah Lamar RAMAN, MD;  Location: [facility];  Service: Cardiopulmonary;  Laterality: N/A;   VIDEO BRONCHOSCOPY N/A 08/28/2021   Procedure: VIDEO BRONCHOSCOPY WITH FLUORO;  Surgeon: Brenna Adine CROME, DO;  Location: [facility];  Service: Cardiopulmonary;  Laterality: N/A;    HEMATOLOGY/ONCOLOGY HISTORY:  Oncology History  Multiple myeloma (HCC)  03/05/2021 Initial Diagnosis   Multiple myeloma (HCC)   03/05/2021 Cancer Staging   Staging form: Plasma Cell Myeloma and Plasma Cell Disorders, AJCC 8th Edition - Clinical stage from 03/05/2021: RISS Stage II (Beta-2 -microglobulin (mg/L): 1.6, Albumin (g/dL): 4.1, ISS: Stage I, High-risk cytogenetics: Absent, LDH: Elevated) - Signed by Federico Norleen ONEIDA MADISON, MD on 03/05/2021 Stage prefix: Initial diagnosis Beta 2 microglobulin range (mg/L): Less than 3.5 Albumin range (g/dL): Greater than or equal to 3.5 Cytogenetics: t(11;14) translocation Serum calcium  level: Normal Serum creatinine level: Normal Bone disease on imaging: Present   Multiple myeloma not having achieved remission (HCC)  03/05/2021 Initial Diagnosis   Multiple myeloma not having achieved remission (  HCC)   03/16/2021 - 06/08/2021 Chemotherapy         06/29/2021 - 06/29/2021 Chemotherapy   Patient is on Treatment Plan : MULTIPLE MYELOMA CyBorD - Weekly Bortezomib        ALLERGIES:  is allergic to amlodipine , beclomethasone, ciclesonide, clarithromycin, ofev  [nintedanib], penicillins, rosuvastatin , trazodone, and  yutrepia [treprostinil sodium].  MEDICATIONS:  Current Outpatient Medications  Medication Sig Dispense Refill   acyclovir  (ZOVIRAX ) 400 MG tablet Take 1 tablet (400 mg total) by mouth 2 (two) times daily. 60 tablet 3   albuterol  (PROVENTIL ) (2.5 MG/3ML) 0.083% nebulizer solution USE ONE VIAL IN NEBULIZER EVERY 6 HOURS AS NEEDED WHEEZING OR SHORTNESS OF BREATH 120 mL 12   albuterol  (VENTOLIN  HFA) 108 (90 Base) MCG/ACT inhaler Inhale 2 puffs into the lungs every 6 hours as needed for wheezing or shortness of breath. 8.5 g 1   ALPRAZolam  (XANAX ) 0.5 MG tablet Take 0.5 mg by mouth at bedtime.     amLODipine  (NORVASC ) 5 MG tablet Take 1 tablet (5 mg total) by mouth daily. 90 tablet 3   ASSESS FULL RANGE PEAK METER DEVI as directed.     azelastine  (ASTELIN ) 0.1 % nasal spray Place 2 sprays into both nostrils 2 (two) times daily. Use in each nostril as directed 30 mL 12   bisoprolol  (ZEBETA ) 5 MG tablet Take 1 tablet (5 mg total) by mouth daily. 30 tablet 2   Budeson-Glycopyrrol-Formoterol (BREZTRI  AEROSPHERE) 160-9-4.8 MCG/ACT AERO Inhale 2 puffs into the lungs in the morning and at bedtime. 10.7 g 11   cetirizine  (ZYRTEC ) 10 MG tablet Take 1 tablet (10 mg total) by mouth daily. 30 tablet 11   cholecalciferol (VITAMIN D) 1000 UNITS tablet Take 3,000 Units by mouth daily.     dextromethorphan (DELSYM) 30 MG/5ML liquid Take 30 mg by mouth as needed for cough.     EPINEPHrine  (EPI-PEN) 0.3 mg/0.3 mL DEVI Inject 0.3 mg into the muscle as needed.     ezetimibe -simvastatin  (VYTORIN ) 10-40 MG tablet Take 1 tablet by mouth daily. 90 tablet 3   fluticasone  (FLONASE) 50 MCG/ACT nasal spray Place 1 spray into both nostrils daily as needed for allergies or rhinitis.     [START ON 07/11/2024] HYDROcodone  bit-homatropine (HYCODAN) 5-1.5 MG/5ML syrup Take 5 mLs by mouth every 4 (four) hours as needed for cough. 900 mL 0   [START ON 06/11/2024] HYDROcodone  bit-homatropine (HYCODAN) 5-1.5 MG/5ML syrup Take 5 mLs by  mouth every 4 (four) hours as needed for cough. 900 mL 0   ibuprofen (ADVIL) 200 MG tablet Take 200 mg by mouth every 6 (six) hours as needed for moderate pain (pain score 4-6).     ketoconazole (NIZORAL) 2 % cream Apply 1 Application topically 2 (two) times daily as needed for irritation.     levothyroxine  (SYNTHROID ) 50 MCG tablet Take 50 mcg by mouth every morning.     liothyronine (CYTOMEL) 5 MCG tablet Take 5 mcg by mouth daily.     montelukast  (SINGULAIR ) 10 MG tablet Take 10 mg by mouth at bedtime. (Patient not taking: Reported on 05/12/2024)     Multiple Vitamin (MULTIVITAMIN) tablet Take 1 tablet by mouth daily.     omeprazole  (PRILOSEC) 40 MG capsule Take 1 capsule (40 mg total) by mouth 2 (two) times daily. Take 30 minutes before a meal (Patient taking differently: Take 40 mg by mouth daily. Take 30 minutes before a meal) 60 capsule 3   OXYGEN Inhale 2 L into the lungs continuous.  sildenafil (REVATIO) 20 MG tablet Take 20 mg by mouth daily as needed (ED).     Simethicone  (SIMETHICONE  ULTRA STRENGTH) 180 MG CAPS Take 1 capsule (180 mg total) by mouth 3 (three) times daily as needed. 90 capsule 0   sodium chloride  (OCEAN) 0.65 % SOLN nasal spray Place 1 spray into both nostrils as needed for congestion.     tamsulosin  (FLOMAX ) 0.4 MG CAPS capsule Take 0.4 mg by mouth daily.     triamcinolone  cream (KENALOG ) 0.1 % Apply 1 application topically daily as needed (sun burn itch).     Turmeric 400 MG CAPS Take 1 capsule by mouth daily.     No current facility-administered medications for this visit.    VITAL SIGNS: BP (!) 148/94 (BP Location: Left Arm, Patient Position: Sitting) Comment: Nurse was notified  Pulse 65   Temp 97.7 F (36.5 C) (Temporal)   Resp 14   Wt 197 lb 1.6 oz (89.4 kg)   SpO2 98%   BMI 28.28 kg/m  Filed Weights   05/12/24 1442  Weight: 197 lb 1.6 oz (89.4 kg)    Estimated body mass index is 28.28 kg/m as calculated from the following:   Height as of  03/19/24: 5' 10 (1.778 m).   Weight as of this encounter: 197 lb 1.6 oz (89.4 kg).  LABS: CBC:    Component Value Date/Time   WBC 5.1 04/21/2024 0818   WBC 5.6 09/12/2023 1743   HGB 13.6 04/21/2024 0818   HCT 40.1 04/21/2024 0818   PLT 217 04/21/2024 0818   MCV 90.5 04/21/2024 0818   NEUTROABS 3.1 04/21/2024 0818   LYMPHSABS 1.0 04/21/2024 0818   MONOABS 0.4 04/21/2024 0818   EOSABS 0.5 04/21/2024 0818   BASOSABS 0.0 04/21/2024 0818   Comprehensive Metabolic Panel:    Component Value Date/Time   NA 134 (L) 04/21/2024 0818   K 3.9 04/21/2024 0818   CL 98 04/21/2024 0818   CO2 31 04/21/2024 0818   BUN 17 04/21/2024 0818   CREATININE 0.74 04/21/2024 0818   GLUCOSE 119 (H) 04/21/2024 0818   CALCIUM  9.4 04/21/2024 0818   AST 26 04/21/2024 0818   ALT 24 04/21/2024 0818   ALKPHOS 54 04/21/2024 0818   BILITOT 0.3 04/21/2024 0818   PROT 8.0 04/21/2024 0818   ALBUMIN 3.9 04/21/2024 0818    RADIOGRAPHIC STUDIES: No results found.  PERFORMANCE STATUS (ECOG) : 2 - Symptomatic, <50% confined to bed  Review of Systems Unless otherwise noted, a complete review of systems is negative.  Physical Exam General: NAD Cardiovascular: regular rate and rhythm Pulmonary: clear ant fields, dyspneic, 4L/[place] Abdomen: soft, nontender, + bowel sounds Extremities: no edema, no joint deformities Skin: no rashes Neurological: Alert and oriented x3  IMPRESSION:  Discussed the use of AI scribe software for clinical note transcription with the patient, who gave verbal consent to proceed.  History of Present Illness  Samuel [name] is a 68 year old male with multiple myeloma and pulmonary fibrosis who presents to clinic for his initial palliative visit. He is accompanied by his wife, Samuel [name].  I introduced myself, [place] RN, and Palliative's role in collaboration with the oncology team. Concept of Palliative Care was introduced as specialized medical care for people and their families  living with serious illness.  It focuses on providing relief from the symptoms and stress of a serious illness.  The goal is to improve quality of life for both the patient and the family. Values and goals  of care important to patient and family were attempted to be elicited.  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 He has been married for 18 years and has four children and four grandchildren, with a granddaughter on the way in the fall.  At home patient is able to perform some ADLs with assistance due to fatigue and dyspnea.   He is concerned about his ability to be alone due to his oxygen needs and mentions a previous incident where his oxygen became disconnected, causing his levels to drop significantly.  Since February, there has been a notable decline in his respiratory function, requiring more oxygen and assistance with mobility. He needs help to get to the bathroom at night and uses an oxygen tank to avoid waking his wife.   He experiences significant dyspnea, which he attributes to pulmonary fibrosis that began after chemotherapy. His condition has deteriorated, and he now requires more oxygen. He uses an oxygen concentrator at home, increasing the flow to 6 liters per minute, and sometimes up to 8 to 10 liters upon exertion. He primarily uses a nasal cannula but has experienced issues with defective masks ordered online. Education provided on face mask use with in office demonstrations and trial.   He has lost 20 pounds, which he attributes to a lack of exercise due to his respiratory condition. He notes a loss of muscle mass and mentions that he would have preferred to lose only 10 pounds. He experiences constipation, for which he takes Miralax and has recently started a stool softener. No diarrhea, nausea, or vomiting, but he reports occasional gas, for which he takes Gas X. Education provided on use of Senna-S 2 tablets at bedtime, if no  improvement in constipation may increase to twice daily.   We discussed his current illness and what it means in the larger context of his on-going co-morbidities. Natural disease trajectory and expectations were discussed.  Samuel [name] and his wife are realistic in their understanding of his current illness and trajectory. His wife shares concerns about future needs in the home to assist with ADLs and symptom management as health changes occur. We discussed maintaining close contact and notifying the team as needed.   I empathetically approached discussions regarding advanced directives, healthcare limitations, code status, and what is most important to him. Samuel [name] reports he recently completed his directives sharing his wife and 2 daughters are his medical decision makers. He desires a natural death (DNR/DNI), no artificial feeding tubes. Patient and wife to bring in documents. Will consider completion of out of facility forms at follow-up.   Samuel [name] is clear in his expressed wishes to continue to treat the treatable allowing him every opportunity to continue thriving. His quality of life is most important to him.   I discussed the importance of continued conversation with family and their medical providers regarding overall plan of care and treatment options, ensuring decisions are within the context of the patients values and GOCs. Assessment & Plan Established therapeutic relationship. Education provided on palliative's role in collaboration with their Oncology/Radiation team.  Pulmonary fibrosis Pulmonary fibrosis exacerbated by chemotherapy, leading to significant respiratory distress and increased oxygen requirements (8-10 L/min with exertion). Difficulty managing oxygen delivery with current equipment. Limited medication options due to side effects. Frustration with communication delays from healthcare providers. - Order a more durable wheelchair with oxygen tank holder for better  mobility. - Provide a backup non-rebreather mask for home use. -  Ensure proper use of MyChart for communication with the healthcare team. - Discuss potential for additional home support if needed.  Multiple myeloma Multiple myeloma with ongoing management.  Constipation Intermittent constipation managed with Miralax. - Recommend Senna S, 2 tablets at bedtime, adjust as needed.  Weight loss Unintentional weight loss of approximately 20 pounds, attributed to decreased physical activity and muscle loss.  Goals of Care Advanced care directives in place, with wife as healthcare power of attorney. Wishes include no artificial feeding and situational resuscitation preferences. Healthcare power of attorney can override resuscitation preferences based on the situation. - Bring a copy of advanced care directives to the next appointment for documentation. -DNR/DNI   Patient expressed understanding and was in agreement with this plan. He also understands that He can call the clinic at any time with any questions, concerns, or complaints.   Thank you for your referral and allowing Palliative to assist in Samuel [name]'s care.   Number and complexity of problems addressed: HIGH - 1 or more chronic illnesses with SEVERE exacerbation, progression, or side effects of treatment - advanced cancer, pain. Any controlled substances utilized were prescribed in the context of palliative care.  Visit consisted of counseling and education dealing with the complex and emotionally intense issues of symptom management and palliative care in the setting of serious and potentially life-threatening illness.  Signed by: Levon Borer, AGPCNP-BC Palliative Medicine Team/[facility]

## 2024-05-14 ENCOUNTER — Ambulatory Visit: Admitting: Behavioral Health

## 2024-05-14 MED ORDER — PREDNISONE 10 MG PO TABS: 10.0000 mg | ORAL_TABLET | Freq: Every day | ORAL | 6 refills | Status: DC

## 2024-05-14 NOTE — Addendum Note (Signed)
 Addended by: GERONIMO AMEL on: 05/14/2024 10:12 AM   Modules accepted: Orders

## 2024-05-14 NOTE — Telephone Encounter (Signed)
 Received message from [facility], Donna:  [name] referral has been received for Samuel [name] 03-22-1956. Patient has been referred for [place] pap assistance due to not meeting Medicare criteria for the [name].  Does not meet Medicare criteria due to mean pulmonary artery pressure of  21 mm hg; additionally calculated pulmonary vascular resistance is 2.98 wu (Medicare requires mean pulmonary artery pressure equal to or greater 25 mm hg and pvr equal to or greater than 3 wu).  We sent the form patient needs to sign to get PAP started out yesterday by [employer] at the patients request.

## 2024-05-18 ENCOUNTER — Telehealth: Payer: Self-pay

## 2024-05-18 NOTE — Telephone Encounter (Addendum)
 Returned call to patient regarding [name] nebs. Advised that Part B will not approve. Patient will follow-through with PAP (he is confident he does not qualify)  Spoke with [place] - she will reach out financial team and see if it can be appealed/ They have appealed in the past but with his mPAP being < 25 she will need to double check).  Sherry Pennant, PharmD, MPH, BCPS, CPP Clinical Pharmacist (Rheumatology and Pulmonology)

## 2024-05-18 NOTE — Telephone Encounter (Signed)
 Returned call to patient regarding [name] nebs. Advised that Part B will not approve. Patient will follow-through with PAP (he is confident he does not qualify)   Spoke with [place] - she will reach out financial team and see if it can be appealed/ They have appealed in the past but with his mPAP being < 25 she will need to double check).

## 2024-05-18 NOTE — Telephone Encounter (Signed)
 Received follow-up email from [employer] - The PAP application was mailed to the pt on Friday. Hopefully, he has received it by now.  When [facility] reviews his application, paperwork will be sent to your office for you to complete.  Once that has been completed and sent back in, [place] PAP will decide if they approve or deny.  His application will be reviewed because he did not meet clinical criteria.  I have seen many people get approval for PAP because of not meeting criteria, so I think he has a real shot at being approved.

## 2024-05-18 NOTE — Telephone Encounter (Signed)
 Copied from CRM #[id number]. Topic: Clinical - Prescription Issue >> May 18, 2024 10:00 AM Celestine FALCON wrote: Reason for CRM: Pt is requesting a call from RPH[facility]CPP Sherry Pennant regarding the prescription [name] nebs. Pt stated she knows what's going on regarding the medication, and he's like to speak to her. Pt's phone number is [phone] ok to leave a vm except 2pm-3:15pm today.   Routing to the pharmacy.

## 2024-05-19 ENCOUNTER — Inpatient Hospital Stay

## 2024-05-19 ENCOUNTER — Other Ambulatory Visit: Payer: Self-pay | Admitting: *Deleted

## 2024-05-19 DIAGNOSIS — C9000 Multiple myeloma not having achieved remission: Secondary | ICD-10-CM

## 2024-05-19 LAB — CBC WITH DIFFERENTIAL (CANCER CENTER ONLY)
Abs Immature Granulocytes: 0.03 K/uL (ref 0.00–0.07)
Basophils Absolute: 0 K/uL (ref 0.0–0.1)
Basophils Relative: 0 %
Eosinophils Absolute: 0.2 K/uL (ref 0.0–0.5)
Eosinophils Relative: 2 %
HCT: 39.3 % (ref 39.0–52.0)
Hemoglobin: 13.4 g/dL (ref 13.0–17.0)
Immature Granulocytes: 0 %
Lymphocytes Relative: 15 %
Lymphs Abs: 1.1 K/uL (ref 0.7–4.0)
MCH: 30.7 pg (ref 26.0–34.0)
MCHC: 34.1 g/dL (ref 30.0–36.0)
MCV: 89.9 fL (ref 80.0–100.0)
Monocytes Absolute: 0.5 K/uL (ref 0.1–1.0)
Monocytes Relative: 7 %
Neutro Abs: 5.5 K/uL (ref 1.7–7.7)
Neutrophils Relative %: 76 %
Platelet Count: 240 K/uL (ref 150–400)
RBC: 4.37 MIL/uL (ref 4.22–5.81)
RDW: 12.7 % (ref 11.5–15.5)
WBC Count: 7.4 K/uL (ref 4.0–10.5)
nRBC: 0 % (ref 0.0–0.2)

## 2024-05-19 LAB — CMP (CANCER CENTER ONLY)
ALT: 20 U/L (ref 0–44)
AST: 25 U/L (ref 15–41)
Albumin: 3.8 g/dL (ref 3.5–5.0)
Alkaline Phosphatase: 50 U/L (ref 38–126)
Anion gap: 5 (ref 5–15)
BUN: 23 mg/dL (ref 8–23)
CO2: 31 mmol/L (ref 22–32)
Calcium: 9.3 mg/dL (ref 8.9–10.3)
Chloride: 98 mmol/L (ref 98–111)
Creatinine: 0.71 mg/dL (ref 0.61–1.24)
GFR, Estimated: >60 mL/min (ref >60)
Glucose, Bld: 116 mg/dL — ABNORMAL HIGH (ref 70–99)
Potassium: 3.8 mmol/L (ref 3.5–5.1)
Sodium: 134 mmol/L — ABNORMAL LOW (ref 135–145)
Total Bilirubin: 0.3 mg/dL (ref 0.0–1.2)
Total Protein: 7.9 g/dL (ref 6.5–8.1)

## 2024-05-19 LAB — LACTATE DEHYDROGENASE: LDH: 248 U/L — ABNORMAL HIGH (ref 98–192)

## 2024-05-21 LAB — MULTIPLE MYELOMA PANEL, SERUM
Albumin SerPl Elph-Mcnc: 3.4 g/dL (ref 2.9–4.4)
Albumin/Glob SerPl: 0.9 (ref 0.7–1.7)
Alpha 1: 0.2 g/dL (ref 0.0–0.4)
Alpha2 Glob SerPl Elph-Mcnc: 0.8 g/dL (ref 0.4–1.0)
B-Globulin SerPl Elph-Mcnc: 1 g/dL (ref 0.7–1.3)
Gamma Glob SerPl Elph-Mcnc: 1.9 g/dL — ABNORMAL HIGH (ref 0.4–1.8)
Globulin, Total: 3.9 g/dL (ref 2.2–3.9)
IgA: 339 mg/dL (ref 61–437)
IgG (Immunoglobin G), Serum: 1954 mg/dL — ABNORMAL HIGH (ref 603–1613)
IgM (Immunoglobulin M), Srm: 116 mg/dL (ref 20–172)
M Protein SerPl Elph-Mcnc: 0.6 g/dL — ABNORMAL HIGH
Total Protein ELP: 7.3 g/dL (ref 6.0–8.5)

## 2024-05-21 NOTE — Telephone Encounter (Signed)
 Sounds good . MEanwhile he emailed me about prednisone  side effects even at 10 mg/day therefore we reduced it to 5 mg/day. Next update please give me once he is approved or denied

## 2024-05-27 NOTE — Telephone Encounter (Signed)
 Received [name] PAP form for provider sig via Onbase. Patient portion has been completed  Form completed and will have signed by MR for submission to [employer]  Sherry Pennant, PharmD, MPH, BCPS, CPP Clinical Pharmacist (Rheumatology and Pulmonology)

## 2024-05-28 NOTE — Telephone Encounter (Signed)
 Received a fax from  [facility] regarding an approval for [name] NEBULIZATION SOLUTION patient assistance from 05/28/2024 to 05/28/2025. Approval letter sent to scan center.  Phone: [phone] [place] Patient ID: [id number]  Sherry Pennant, PharmD, MPH, BCPS, CPP Clinical Pharmacist (Rheumatology and Pulmonology)

## 2024-06-02 NOTE — Addendum Note (Signed)
 Addended by: DAYNE SHERRY RAMAN on: 06/02/2024 12:06 PM   Modules accepted: Orders

## 2024-06-03 NOTE — Telephone Encounter (Signed)
 Patient's first [name] nebs dispensed on 05/31/24  Sasha Rueth, PharmD, MPH, BCPS, CPP Clinical Pharmacist (Rheumatology and Pulmonology)

## 2024-06-09 ENCOUNTER — Inpatient Hospital Stay: Attending: Hematology and Oncology | Admitting: Nurse Practitioner

## 2024-06-09 DIAGNOSIS — Z7952 Long term (current) use of systemic steroids: Secondary | ICD-10-CM | POA: Insufficient documentation

## 2024-06-09 DIAGNOSIS — R0602 Shortness of breath: Secondary | ICD-10-CM

## 2024-06-09 DIAGNOSIS — Z7189 Other specified counseling: Secondary | ICD-10-CM | POA: Diagnosis not present

## 2024-06-09 DIAGNOSIS — R530 Neoplastic (malignant) related fatigue: Secondary | ICD-10-CM | POA: Diagnosis not present

## 2024-06-09 DIAGNOSIS — Z79899 Other long term (current) drug therapy: Secondary | ICD-10-CM | POA: Insufficient documentation

## 2024-06-09 DIAGNOSIS — Z79624 Long term (current) use of inhibitors of nucleotide synthesis: Secondary | ICD-10-CM | POA: Insufficient documentation

## 2024-06-09 DIAGNOSIS — C9000 Multiple myeloma not having achieved remission: Secondary | ICD-10-CM | POA: Insufficient documentation

## 2024-06-09 DIAGNOSIS — Z515 Encounter for palliative care: Secondary | ICD-10-CM

## 2024-06-09 DIAGNOSIS — Z79890 Hormone replacement therapy (postmenopausal): Secondary | ICD-10-CM | POA: Insufficient documentation

## 2024-06-09 DIAGNOSIS — Z87891 Personal history of nicotine dependence: Secondary | ICD-10-CM | POA: Insufficient documentation

## 2024-06-09 DIAGNOSIS — Z9981 Dependence on supplemental oxygen: Secondary | ICD-10-CM | POA: Insufficient documentation

## 2024-06-10 ENCOUNTER — Telehealth: Admitting: Internal Medicine

## 2024-06-10 ENCOUNTER — Encounter: Payer: Self-pay | Admitting: Hematology and Oncology

## 2024-06-10 ENCOUNTER — Encounter: Payer: Self-pay | Admitting: Internal Medicine

## 2024-06-10 VITALS — HR 72

## 2024-06-10 DIAGNOSIS — J9611 Chronic respiratory failure with hypoxia: Secondary | ICD-10-CM | POA: Diagnosis not present

## 2024-06-10 DIAGNOSIS — Z7189 Other specified counseling: Secondary | ICD-10-CM | POA: Diagnosis not present

## 2024-06-10 DIAGNOSIS — J849 Interstitial pulmonary disease, unspecified: Secondary | ICD-10-CM

## 2024-06-10 DIAGNOSIS — I2723 Pulmonary hypertension due to lung diseases and hypoxia: Secondary | ICD-10-CM | POA: Diagnosis not present

## 2024-06-10 MED ORDER — POTASSIUM CHLORIDE CRYS ER 20 MEQ PO TBCR: 20.0000 meq | EXTENDED_RELEASE_TABLET | Freq: Every day | ORAL | 0 refills | Status: DC

## 2024-06-10 MED ORDER — FUROSEMIDE 40 MG PO TABS: 40.0000 mg | ORAL_TABLET | Freq: Every day | ORAL | 0 refills | Status: DC | PRN

## 2024-06-10 NOTE — Progress Notes (Signed)
 Palliative Medicine [facility]  [phone] [phone]   Name: Samuel [name] Date: 06/10/2024 MRN: [id number]  DOB: [birth year]  Patient Care Team: Seabron Lenis, MD as PCP - General (Family Medicine) [place]mira Newman JINNY, MD as PCP - Cardiology (Cardiology)   I connected with Samuel [name] on 06/10/24 at 12:30 PM EDT by MyChart video and verified that I am speaking with the correct person using two identifiers.   I discussed the limitations, risks, security and privacy concerns of performing an evaluation and management service by telemedicine and the availability of in-person appointments. I also discussed with the patient that there may be a patient responsible charge related to this service. The patient expressed understanding and agreed to proceed.   Other persons participating in the visit and their role in the encounter: N/A   Patient's location: Home   Provider's location: [facility]   INTERVAL HISTORY: Samuel [name] is a 68 y.o. male with with oncologic medical history including multiple myeloma with multiple lytic lesions to thoracolumbar spine and bilateral pelvis, developed pulmonary fibrosis.  Palliative is seeing patient for symptom management and goals of care.   SOCIAL HISTORY:     reports that he quit smoking about 41 years ago. His smoking use included cigarettes. He started smoking about 44 years ago. He has a 0.3 pack-year smoking history. He has never used smokeless tobacco. He reports that he does not currently use alcohol. He reports that he does not use drugs.  ADVANCE DIRECTIVES:  Active, not on file. Patient to bring in.   CODE STATUS: DNR  PAST MEDICAL HISTORY: Past Medical History:  Diagnosis Date   Asthma    GERD (gastroesophageal reflux disease)    High cholesterol    under control.    History of blood transfusion    Hypothyroidism    Interstitial lung disease (HCC)    Multiple myeloma (HCC)    Perennial  allergic rhinitis    Pneumonia    Sciatic pain, right    Seasonal allergic rhinitis    Thyroid disease     ALLERGIES:  is allergic to amlodipine , beclomethasone, ciclesonide, clarithromycin, ofev  [nintedanib], penicillins, rosuvastatin , trazodone, and yutrepia [treprostinil sodium].  MEDICATIONS:  Current Outpatient Medications  Medication Sig Dispense Refill   acyclovir  (ZOVIRAX ) 400 MG tablet Take 1 tablet (400 mg total) by mouth 2 (two) times daily. 60 tablet 3   albuterol  (PROVENTIL ) (2.5 MG/3ML) 0.083% nebulizer solution USE ONE VIAL IN NEBULIZER EVERY 6 HOURS AS NEEDED WHEEZING OR SHORTNESS OF BREATH 120 mL 12   albuterol  (VENTOLIN  HFA) 108 (90 Base) MCG/ACT inhaler Inhale 2 puffs into the lungs every 6 hours as needed for wheezing or shortness of breath. 8.5 g 1   ALPRAZolam  (XANAX ) 0.5 MG tablet Take 0.5 mg by mouth at bedtime.     amLODipine  (NORVASC ) 5 MG tablet Take 1 tablet (5 mg total) by mouth daily. 90 tablet 3   ASSESS FULL RANGE PEAK METER DEVI as directed.     azelastine  (ASTELIN ) 0.1 % nasal spray Place 2 sprays into both nostrils 2 (two) times daily. Use in each nostril as directed 30 mL 12   bisoprolol  (ZEBETA ) 5 MG tablet Take 1 tablet (5 mg total) by mouth daily. 30 tablet 2   Budeson-Glycopyrrol-Formoterol (BREZTRI  AEROSPHERE) 160-9-4.8 MCG/ACT AERO Inhale 2 puffs into the lungs in the morning and at bedtime. 10.7 g 11   cetirizine  (ZYRTEC ) 10 MG tablet Take 1 tablet (10 mg total)  by mouth daily. 30 tablet 11   cholecalciferol (VITAMIN D) 1000 UNITS tablet Take 3,000 Units by mouth daily.     dextromethorphan (DELSYM) 30 MG/5ML liquid Take 30 mg by mouth as needed for cough. (Patient not taking: Reported on 06/10/2024)     EPINEPHrine  (EPI-PEN) 0.3 mg/0.3 mL DEVI Inject 0.3 mg into the muscle as needed.     ezetimibe -simvastatin  (VYTORIN ) 10-40 MG tablet Take 1 tablet by mouth daily. 90 tablet 3   fexofenadine (ALLEGRA) 180 MG tablet Take 180 mg by mouth daily.      fluticasone  (FLONASE) 50 MCG/ACT nasal spray Place 1 spray into both nostrils daily as needed for allergies or rhinitis.     furosemide  (LASIX ) 40 MG tablet Take 1 tablet (40 mg total) by mouth daily as needed. 30 tablet 0   [START ON 07/11/2024] HYDROcodone  bit-homatropine (HYCODAN) 5-1.5 MG/5ML syrup Take 5 mLs by mouth every 4 (four) hours as needed for cough. 900 mL 0   [START ON 06/11/2024] HYDROcodone  bit-homatropine (HYCODAN) 5-1.5 MG/5ML syrup Take 5 mLs by mouth every 4 (four) hours as needed for cough. 900 mL 0   ibuprofen (ADVIL) 200 MG tablet Take 200 mg by mouth every 6 (six) hours as needed for moderate pain (pain score 4-6).     ketoconazole (NIZORAL) 2 % cream Apply 1 Application topically 2 (two) times daily as needed for irritation.     levothyroxine  (SYNTHROID ) 50 MCG tablet Take 50 mcg by mouth every morning.     liothyronine (CYTOMEL) 5 MCG tablet Take 5 mcg by mouth daily.     montelukast  (SINGULAIR ) 10 MG tablet Take 10 mg by mouth at bedtime.     Multiple Vitamin (MULTIVITAMIN) tablet Take 1 tablet by mouth daily.     omeprazole  (PRILOSEC) 40 MG capsule Take 1 capsule (40 mg total) by mouth 2 (two) times daily. Take 30 minutes before a meal 60 capsule 3   OXYGEN Inhale 2 L into the lungs continuous.     potassium chloride  SA (KLOR-CON  M20) 20 MEQ tablet Take 1 tablet (20 mEq total) by mouth daily. 30 tablet 0   predniSONE  (DELTASONE ) 10 MG tablet Take 1 tablet (10 mg total) by mouth daily with breakfast. (Patient taking differently: Take 5 mg by mouth daily with breakfast.) 30 tablet 6   sildenafil (REVATIO) 20 MG tablet Take 20 mg by mouth daily as needed (ED). (Patient not taking: Reported on 06/10/2024)     Simethicone  (SIMETHICONE  ULTRA STRENGTH) 180 MG CAPS Take 1 capsule (180 mg total) by mouth 3 (three) times daily as needed. 90 capsule 0   sodium chloride  (OCEAN) 0.65 % SOLN nasal spray Place 1 spray into both nostrils as needed for congestion.     tamsulosin  (FLOMAX )  0.4 MG CAPS capsule Take 0.4 mg by mouth daily.     Treprostinil ([name]) 0.6 MG/ML SOLN Inhale into the lungs 4 (four) times daily. Inhale 3 breaths per treatment session four times daily. Increase weekly to goal dose 9 to 12 breaths per treatment session four times daily     triamcinolone  cream (KENALOG ) 0.1 % Apply 1 application topically daily as needed (sun burn itch).     Turmeric 400 MG CAPS Take 1 capsule by mouth daily.     No current facility-administered medications for this visit.    VITAL SIGNS: There were no vitals taken for this visit. There were no vitals filed for this visit.  Estimated body mass index is 28.28 kg/m as calculated from  the following:   Height as of 03/19/24: 5' 10 (1.778 m).   Weight as of 05/12/24: 197 lb 1.6 oz (89.4 kg).  PERFORMANCE STATUS (ECOG) : 2 - Symptomatic, <50% confined to bed  Physical Exam General: NAD Cardiovascular: regular rate and rhythm Pulmonary: normal breathing pattern Extremities: no edema, no joint deformities Skin: no rashes Neurological: AAO x3  IMPRESSION: Discussed the use of AI scribe software for clinical note transcription with the patient, who gave verbal consent to proceed.  History of Present Illness Helio Lack is a 68 year old male with history of multiple myeloma and pulmonary fibrosis who I connected with via MyChart video for follow-up. No acute distress noted. He is doing as well as expected overall. Denies concerns for nausea, vomiting, constipation, or diarrhea. Occasional fatigue. His appetite is fair some days better than others. He is able to work some hours in the office and other times in the home.   Patient received ordered wheelchair however it is too heavy, affecting his mobility. He is interested in exchanging it for a lighter model that can also accommodate an oxygen tank.  He has a five-liter oxygen machine and is inquiring about obtaining a humidifier for it. He already has a humidifier  for larger home tank unit. He is concerned about his current oxygen setup and seeks confirmation on obtaining an additional humidifier.  Mr. Swanner is taking alprazolam  1 mg as needed for anxiety and hydrocodone  cough syrup as needed for uncontrolled cough.  No uncontrolled symptoms currently reported.  Will continue to closely monitor and support as needed.  Assessment & Plan Chronic respiratory disease requiring home oxygen and nebulizer Chronic respiratory disease managed with home oxygen and nebulizer therapy. Current issues include the need for a lighter wheelchair and a humidifier for the oxygen system. He is concerned about maintaining the current oxygen setup without jeopardizing it. - Exchange current wheelchair for a lightweight model with an attachment for the oxygen tank. - Request a humidifier for the 5-liter oxygen machine. - Provide information to him about the options for obtaining a humidifier.  Follow-Up Follow-up visit scheduled to ensure continuity of care and address ongoing needs related to chronic respiratory disease management. - Schedule follow-up visit for October 1st at 9:30 AM. - Check in with him during his lab work appointment on August 27th to ensure he has received the necessary equipment.  Patient expressed understanding and was in agreement with this plan. He also understands that He can call the clinic at any time with any questions, concerns, or complaints.   Any controlled substances utilized were prescribed in the context of palliative care. PDMP has been reviewed.   Visit consisted of counseling and education dealing with the complex and emotionally intense issues of symptom management and palliative care in the setting of serious and potentially life-threatening illness.  Levon Borer, AGPCNP-BC  Palliative Medicine Team/[facility]

## 2024-06-10 NOTE — Progress Notes (Signed)
 OV 05/12/2024  Subjective:  Patient ID: Samuel [name], male , DOB: 23-Aug-1956 , age 68 y.o. , MRN: [id number] , ADDRESS: [address] [place] [address] PCP Seabron Lenis, MD Patient Care Team: Seabron Lenis, MD as PCP - General (Family Medicine) Elmira Newman JINNY, MD as PCP - Cardiology (Cardiology)  This Provider for this visit: Treatment Team:  Attending Provider: Geronimo Amel, MD    Follow up : ILD , drug-induced pneumonitis, chronic respiratory failure, asthma Esbriet  stopped in October 2023 due to inability to tolerate due to GI side effects   Ofev  spped in 2022 -intolerant  Esbriet  stopped in October 2023 due to inability to tolerate due to GI side effects   # IgG Kappa Multiple Myeloma 02/22/2021: bone marrow biopsy confirms the diagnosis of Multiple Myeloma with a monoclonal plasma cell population.   # History of asthma.  Mid February 2020 for mildly elevated IgE and dust mite allergy .  On RAST allergy  panel.  #*WHO group 3 pulmonary hypertension mean nocturnal artery pressure of 21 diagnosed April 2025 0  > started DPI treprostinil Mar 11, 2024  -intolerant by June 2025 because of hypertension > Started YUTPREIA -but intolerant by July 2025 because of bronchospasm  05/12/2024 -   Chief Complaint  Patient presents with   Medical Management of Chronic Issues    ILD- increased SOB, cough since last visit. He has had asthma like symptoms- bronchospasms. Stopped Yutrepia 4 days ago.      HPI Samuel [name] 68 y.o. -returns for follow-up.  He was on inhaled treprostinil DPI but did not tolerated.  We then put him on the new 3 technology version of treprostinil call YUTREPIA but he developed bronchospasm.  He is not able to tolerate it.  He is now interested in the treprostinil nebulizer.  But I did indicate to him and his wife that very likely will have side effects but it is definitely worth trying but pending insurance approval.  He is interested  in clinical trials but his myeloma is an exclusion.  He is now significantly more hypoxemic he is requiring 4 L at rest.  There is a easily desaturates on room air at rest.  He and his wife had several questions - He says the main thing is for him to control his air hunger or calm his breathing.  He says anxiety makes it worse meditation and acupuncture makes it better anxiolytic such as Xanax  also makes it better.  We discussed about morphine as a palliative relief for dyspnea.  He of note he is already on Hycodan for cough.  He also brought up the idea of chronic daily prednisone  which apparently is a last resort and myeloma.  I said I was open to this idea particularly to quell inflammation and also to see if it will help with his dyspnea and cough.  Therefore we decided to prescribe this.  Nerandomilast might be approved in 6 months but he has significant fibrosis at this point in time.  I did discuss about this medication as well   - Chronic cough did refill on Hycodan  -Goals of care and life expectancy: He wants to continue to work he continues to work actually.  He spends a few hours in the office.  Despite all his health issues.  Did indicate that prognosis is guarded and life expectancy could be up to 1 year.  He struggling between myeloma and also fibrosis which is progressive.  OV 06/10/2024  Subjective:  Patient ID: Samuel [name], male , DOB: [birth year] , age 68 y.o. , MRN: [id number] , ADDRESS: [address] [place] [address] PCP Seabron Lenis, MD Patient Care Team: Seabron Lenis, MD as PCP - General (Family Medicine) Elmira Newman JINNY, MD as PCP - Cardiology (Cardiology)  This Provider for this visit: Treatment Team:  Attending Provider: Geronimo Amel, MD    06/10/2024 -   Chief Complaint  Patient presents with   Medical Management of Chronic Issues   Interstitial Lung Disease     HPI Samuel [name] 68 y.o. - on tyvaso neb NOW x 1 week. Firs  dose. Now doing 4x/day. So far handling it ok but says has mild increase in cough, some sore throat, jitteriness but also has new onset edema straight away. Initially started feeling better and then 3rd or 4th day, increase dyspnea and RN Kandy Nett of [facility]) concerned that fluid is building. Also wife on video reporting desaturaing  a lot. Rest 6L [place]. Going to bathroom very slowly 20-30 feet needs 8L. When he gets up fro toilet he desaturates to 84%. They no have bedside commode. Taking a showr is very hard  - after that takes long time 20 minutes. Wife helps him. He sent me email wanting lasix  for edema  Despite all this: He is still going to office. Wife drops him off 5 min away  Goals of care conversation  - symptoms: dyspnea /cough. We discussed anxiety makes things worse. Going for acupuncture. Has been to opd palliative care clinic at the cancer center 1 time. Encouraged him to explore this more with outpatient pallaitive care (he has myeloma)  - wants to know life expectancy: explained that easy desaturations is a marker of diminished but he feels his current worsening. Likely < 1 year based on description. HE agreed to do PFT  - wanted to know how end of life looks:discussed dyspnea/cough and how we control  - Advanced Care planning: he is going through some tweaks of trust. HE says he has done living well. He and wife are each other's POA. It says tat he should not . Introduced him to [facility]. Advised NO CPR, NO INTUBATION. However, he is interesetd in lung transplant. [facility] declined due to [facility]. Will reach out to [facility]  SYMPTOM SCALE - ILD 08/09/2021 09/25/2021 218# 12/04/2021 222# 01/24/22 222# 04/09/2022 215# 06/06/2022 211# 06/12/2023 205# 09/12/2023  05/12/2024 Using 4L rest, 10L exertion. RA is 84% Rest  Current weight    Treadmill 2.2 mph.  As 1.4 miles over 40 minutes.  Uses 8 L oxygen Post may 2nd visit 03/27/22 -2ndcvoid      O2 use ra ra ra ra 2L eert 2L  exert O2 with exertion    Shortness of Breath 0 -> 5 scale with 5 being worst (score 6 If unable to do)          At rest 0 0 0 0 0 4 0.5 2   Simple tasks - showers, clothes change, eating, shaving 2 2 1  0/5 0 3 2 3    Household (dishes, doing bed, laundry) x na 1 1 1 3 3 4    Shopping 3 1 1.5 0/5 1 3  0.5 3   Walking level at own pace 4 2 2 1 1 2  2.5 4   Walking up Stairs 5 4 3  3.5 3 2 5 5    Total (30-36) Dyspnea Score 14 9 8.5 6 6 17  14.5 21  How bad is your cough? 3 1 0.5  3.4 5 Only iwht acid reflux 4   How bad is your fatigue 0 2 0  3   3   How bad is nausea 0 0 0  0 5 0 1   How bad is vomiting?  0 0 0  0 0 0 0   How bad is diarrhea? 0 0 0  0 4 0 1   How bad is anxiety? 5 2 1   0.5 0 6 3   How bad is depression 2 1 1   0.5 0 2 3   Any chronic pain - if so where and how bad x x x  x  x 0      Simple office walk 185 feet x  3 laps goal with forehead probe 07/12/2021  08/09/2021  09/25/2021  12/04/2021  01/24/2022  04/09/2022  06/06/2022  12/26/2022  06/12/2023 09/12/2023   O2 used ra Ra3 ra ra ra ra ra ra ra  Number laps completed 3 3 but did oly 2 3 3  attempted but stopped at 2 due to deats All 3 las Stopped at [employer] all 3 Stopped at 2 1 lap and even that in half away  Comments about pace slow slow slow slow Avg pace Avg  slow slow   Resting Pulse Ox/HR 100% and 69/min 100%ad 74 98% and 75 99% RA and59 98% and HR 60 98% and HR 54 98% nand HR 90 98% n HR 73 100% and HR 64  Final Pulse Ox/HR 92% and 81/min 93% and 87 91% and 91 86% RA and 85 91% and HR 80 (brief 89%) 88% and HR 80 91% and HR 107 90% an HR 79 92% and HR 67 - half lap  Desaturated </= 88% no no no yes ni  no    Desaturated <= 3% points Yes 8 Yes, 7 pponts Yes, 7 points Yes, 13 poins Yes 7 points Yes, 10 points Yes 7 poin    Got Tachycardic >/= 90/min no no yes no no  uyes    Symptoms at end of test No complaints Mod-severe dyspnea Mild dyspnea No complaints none Seemed ok Mild dyspnea    Miscellaneous comments x  Worse? stable ? worse     Reduced efforr tolerance      PFT     Latest Ref Rng & Units 06/12/2023   11:30 AM 01/17/2023    2:09 PM 09/26/2022   10:34 AM 08/01/2022    1:57 PM 06/06/2022    1:52 PM 12/10/2021    9:24 AM 10/16/2021    3:56 PM  PFT Results  FVC-Pre L 1.19  1.36  1.32  1.55  1.43  1.49  1.35   FVC-Predicted Pre % 26  29  28   33  30  32  29   Pre FEV1/FVC % % 88  90  85  94  88  94  92   FEV1-Pre L 1.05  1.23  1.12  1.46  1.26  1.40  1.24   FEV1-Predicted Pre % 30  36  32  42  36  40  36   DLCO uncorrected ml/min/mmHg 9.36  12.92  9.61  10.43  13.19  8.64  9.99   DLCO UNC% % 35  48  36  39  49  32  37   DLCO corrected ml/min/mmHg 9.36  13.27  9.96  10.58  13.77  9.20  10.04   DLCO COR %Predicted %  35  49  37  39  51  34  37   DLVA Predicted % 126  127  107  110  128  97  109        LAB RESULTS last 96 hours No results found.       has a past medical history of Asthma, GERD (gastroesophageal reflux disease), High cholesterol, History of blood transfusion, Hypothyroidism, Interstitial lung disease (HCC), Multiple myeloma (HCC), Perennial allergic rhinitis, Pneumonia, Sciatic pain, right, Seasonal allergic rhinitis, and Thyroid disease.   reports that he quit smoking about 41 years ago. His smoking use included cigarettes. He started smoking about 44 years ago. He has a 0.3 pack-year smoking history. He has never used smokeless tobacco.  Past Surgical History:  Procedure Laterality Date   BRONCHIAL BIOPSY  06/18/2021   Procedure: BRONCHIAL BIOPSIES;  Surgeon: Shelah Lamar RAMAN, MD;  Location: [facility];  Service: Cardiopulmonary;;   BRONCHIAL BIOPSY  08/28/2021   Procedure: BRONCHIAL BIOPSIES;  Surgeon: Brenna Adine CROME, DO;  Location: [facility];  Service: Cardiopulmonary;;   BRONCHIAL WASHINGS  06/18/2021   Procedure: BRONCHIAL WASHINGS;  Surgeon: Shelah Lamar RAMAN, MD;  Location: [facility];  Service: Cardiopulmonary;;   BRONCHIAL WASHINGS  08/28/2021    Procedure: BRONCHIAL WASHINGS;  Surgeon: Brenna Adine CROME, DO;  Location: [facility];  Service: Cardiopulmonary;;   NASAL SINUS SURGERY     NECK SURGERY  1996   RIGHT HEART CATH N/A 01/23/2024   Procedure: RIGHT HEART CATH;  Surgeon: Elmira Newman PARAS, MD;  Location: [facility];  Service: Cardiovascular;  Laterality: N/A;   VIDEO BRONCHOSCOPY N/A 06/18/2021   Procedure: VIDEO BRONCHOSCOPY WITH FLUORO;  Surgeon: Shelah Lamar RAMAN, MD;  Location: [facility];  Service: Cardiopulmonary;  Laterality: N/A;   VIDEO BRONCHOSCOPY N/A 08/28/2021   Procedure: VIDEO BRONCHOSCOPY WITH FLUORO;  Surgeon: Brenna Adine CROME, DO;  Location: [facility];  Service: Cardiopulmonary;  Laterality: N/A;    Allergies  Allergen Reactions   Amlodipine  Swelling   Beclomethasone Other (See Comments)    unknown   Ciclesonide Other (See Comments)    unknown   Clarithromycin Other (See Comments)    Hiccups   Ofev  [Nintedanib] Other (See Comments)    GI bleeding   Penicillins Other (See Comments)    Child hood unsure   Rosuvastatin      Other Reaction(s): hand swelling   Trazodone     Other Reaction(s): sweating   Olga [Treprostinil Sodium] Cough    Bronchial spasms, sore throat.    Immunization History  Administered Date(s) Administered   Fluad Quad(high Dose 65+) 08/09/2021, 08/01/2022, 07/22/2023   Fluzone Influenza virus vaccine,trivalent (IIV3), split virus 10/27/2017   Influenza, High Dose Seasonal PF 12/01/2017, 11/29/2019, 12/19/2020, 12/24/2021   Influenza, Quadrivalent, Recombinant, Inj, Pf 07/28/2019, 08/11/2020   Influenza-Unspecified 11/26/2011, 07/21/2017   PFIZER(Purple Top)SARS-COV-2 Vaccination 12/27/2019, 01/24/2020, 09/21/2020   Tdap 08/20/2005, 09/07/2015   Zoster, Live 11/06/2017, 05/07/2018    Family History  Problem Relation Age of Onset   Hypertension Mother    Allergies Mother    Allergies Father    CAD Father 27   Breast cancer Paternal Grandmother     Hypertension Other    Heart disease Other      Current Outpatient Medications:    acyclovir  (ZOVIRAX ) 400 MG tablet, Take 1 tablet (400 mg total) by mouth 2 (two) times daily., Disp: 60 tablet, Rfl: 3   albuterol  (PROVENTIL ) (2.5 MG/3ML) 0.083% nebulizer solution, USE ONE VIAL IN NEBULIZER  EVERY 6 HOURS AS NEEDED WHEEZING OR SHORTNESS OF BREATH, Disp: 120 mL, Rfl: 12   albuterol  (VENTOLIN  HFA) 108 (90 Base) MCG/ACT inhaler, Inhale 2 puffs into the lungs every 6 hours as needed for wheezing or shortness of breath., Disp: 8.5 g, Rfl: 1   ALPRAZolam  (XANAX ) 0.5 MG tablet, Take 0.5 mg by mouth at bedtime., Disp: , Rfl:    ASSESS FULL RANGE PEAK METER DEVI, as directed., Disp: , Rfl:    azelastine  (ASTELIN ) 0.1 % nasal spray, Place 2 sprays into both nostrils 2 (two) times daily. Use in each nostril as directed, Disp: 30 mL, Rfl: 12   bisoprolol  (ZEBETA ) 5 MG tablet, Take 1 tablet (5 mg total) by mouth daily., Disp: 30 tablet, Rfl: 2   Budeson-Glycopyrrol-Formoterol (BREZTRI  AEROSPHERE) 160-9-4.8 MCG/ACT AERO, Inhale 2 puffs into the lungs in the morning and at bedtime., Disp: 10.7 g, Rfl: 11   cholecalciferol (VITAMIN D) 1000 UNITS tablet, Take 3,000 Units by mouth daily., Disp: , Rfl:    EPINEPHrine  (EPI-PEN) 0.3 mg/0.3 mL DEVI, Inject 0.3 mg into the muscle as needed., Disp: , Rfl:    ezetimibe -simvastatin  (VYTORIN ) 10-40 MG tablet, Take 1 tablet by mouth daily., Disp: 90 tablet, Rfl: 3   fexofenadine (ALLEGRA) 180 MG tablet, Take 180 mg by mouth daily., Disp: , Rfl:    fluticasone  (FLONASE) 50 MCG/ACT nasal spray, Place 1 spray into both nostrils daily as needed for allergies or rhinitis., Disp: , Rfl:    furosemide  (LASIX ) 40 MG tablet, Take 1 tablet (40 mg total) by mouth daily as needed., Disp: 30 tablet, Rfl: 0   ibuprofen (ADVIL) 200 MG tablet, Take 200 mg by mouth every 6 (six) hours as needed for moderate pain (pain score 4-6)., Disp: , Rfl:    ketoconazole (NIZORAL) 2 % cream, Apply 1  Application topically 2 (two) times daily as needed for irritation., Disp: , Rfl:    levothyroxine  (SYNTHROID ) 50 MCG tablet, Take 50 mcg by mouth every morning., Disp: , Rfl:    liothyronine (CYTOMEL) 5 MCG tablet, Take 5 mcg by mouth daily., Disp: , Rfl:    montelukast  (SINGULAIR ) 10 MG tablet, Take 10 mg by mouth at bedtime., Disp: , Rfl:    Multiple Vitamin (MULTIVITAMIN) tablet, Take 1 tablet by mouth daily., Disp: , Rfl:    omeprazole  (PRILOSEC) 40 MG capsule, Take 1 capsule (40 mg total) by mouth 2 (two) times daily. Take 30 minutes before a meal, Disp: 60 capsule, Rfl: 3   OXYGEN, Inhale 2 L into the lungs continuous., Disp: , Rfl:    potassium chloride  SA (KLOR-CON  M20) 20 MEQ tablet, Take 1 tablet (20 mEq total) by mouth daily., Disp: 30 tablet, Rfl: 0   predniSONE  (DELTASONE ) 10 MG tablet, Take 1 tablet (10 mg total) by mouth daily with breakfast. (Patient taking differently: Take 5 mg by mouth daily with breakfast.), Disp: 30 tablet, Rfl: 6   Simethicone  (SIMETHICONE  ULTRA STRENGTH) 180 MG CAPS, Take 1 capsule (180 mg total) by mouth 3 (three) times daily as needed., Disp: 90 capsule, Rfl: 0   sodium chloride  (OCEAN) 0.65 % SOLN nasal spray, Place 1 spray into both nostrils as needed for congestion., Disp: , Rfl:    tamsulosin  (FLOMAX ) 0.4 MG CAPS capsule, Take 0.4 mg by mouth daily., Disp: , Rfl:    Treprostinil ([name]) 0.6 MG/ML SOLN, Inhale into the lungs 4 (four) times daily. Inhale 3 breaths per treatment session four times daily. Increase weekly to goal dose 9 to 12  breaths per treatment session four times daily, Disp: , Rfl:    triamcinolone  cream (KENALOG ) 0.1 %, Apply 1 application topically daily as needed (sun burn itch)., Disp: , Rfl:    Turmeric 400 MG CAPS, Take 1 capsule by mouth daily., Disp: , Rfl:    amLODipine  (NORVASC ) 5 MG tablet, Take 1 tablet (5 mg total) by mouth daily., Disp: 90 tablet, Rfl: 3   cetirizine  (ZYRTEC ) 10 MG tablet, Take 1 tablet (10 mg total) by  mouth daily., Disp: 30 tablet, Rfl: 11   dextromethorphan (DELSYM) 30 MG/5ML liquid, Take 30 mg by mouth as needed for cough. (Patient not taking: Reported on 06/10/2024), Disp: , Rfl:    [START ON 07/11/2024] HYDROcodone  bit-homatropine (HYCODAN) 5-1.5 MG/5ML syrup, Take 5 mLs by mouth every 4 (four) hours as needed for cough., Disp: 900 mL, Rfl: 0   [START ON 06/11/2024] HYDROcodone  bit-homatropine (HYCODAN) 5-1.5 MG/5ML syrup, Take 5 mLs by mouth every 4 (four) hours as needed for cough., Disp: 900 mL, Rfl: 0   sildenafil (REVATIO) 20 MG tablet, Take 20 mg by mouth daily as needed (ED). (Patient not taking: Reported on 06/10/2024), Disp: , Rfl:       Objective:   Vitals:   06/10/24 1622  Pulse: 72  SpO2: 97%    Estimated body mass index is 28.28 kg/m as calculated from the following:   Height as of 03/19/24: 5' 10 (1.778 m).   Weight as of 05/12/24: 197 lb 1.6 oz (89.4 kg).  @WEIGHTCHANGE @  There were no vitals filed for this visit.   Physical Exam   General: No distress. Looks same but coughs when talks O2 at rest: yes [place] present: no Sitting in wheel chair: no Frail: no Obese: yes Neuro: Alert and Oriented x 3. GCS 15. Speech normal Psych: Pleasant Resp:  Barrel Chest - no.         Assessment/     Assessment & Plan ILD (interstitial lung disease) (HCC)  WHO group 3 pulmonary arterial hypertension (HCC)  Chronic respiratory failure with hypoxia (HCC)  Goals of care, counseling/discussion    PLAN Patient Instructions  WHO-3 Pulmonary Hypertension - new diagnosis 01/23/24. Mean pressure Hypertensio with [name] at dose iniitation - May 2025 Bronchospasm with Olga -July 2025  - as of 06/10/2024 - tyvaso neb and tolerating it with some mild side effects  Plan  - Marked treprostinil DPI and Yutrepia as allergy  - Continue treprostinil nebulizer   ILD (interstitial lung disease) (HCC) Drug-induced pneumonitis  - Fibrosis is worse;  - intolerance  to anti fibrotics esbreit and ofev  - on low dose prednisone  since early July 2025  Plan - supportive care -  continue empiric prednisone  10 mg/day - await Nerandomilast approval - do spirometry in 4-8 weeks  Chronic respiratory failure  -Progressively getting worse  Plan  - continue o2 as before [4 L at rest, 10 L with exertion] - emailed [facility] team to see if you might qualify for luing transplant  Myeloma    M protein < 0.8 in July 2024 and 0.5 in March 2025 and 0.6 in April 2025 and 0.7 in July 2025  Plan  - per [facility] and Dr Federico - Hopefully empiric prednisone  10 mg/day can help with this  Anxiety Chronic cough Class IV dyspnea  - affecting quality of life but glad you are now talking about it and addressing it - noted that you have seen outpatient palliative care at the cancer center  Plan  - meditation +  acupuncuture  - continue Xanax  - Continue Hycodan cough syrup 5 mg x 6  times daily as needed - REach out to outpatient palliative care clinic - Nexk step consider scheduled oral morphine syrup  Goals of care  -Life expectancy is diminished and we talked about it; suspect < 1 year based on current quality of life  Plan  -Focus on quality of life issues and treat the treatable - Optimize to the extent that you can still continue to work - Do MOST FORM (sent via email)  -   Followup - 4-8 weeks after PFT; 30 minutes    FOLLOWUP    Return in about 6 weeks (around 07/22/2024) for 30 min visit, after Spiro and DLCO, ILD, Face to Face Visit.  ( Level 05 visit E&M 2024: Estb >= 40 min  visit type: on-site physical face to visit  in total care time and counseling or/and coordination of care by this undersigned MD - Dr Dorethia Cave. This includes one or more of the following on this same day 06/10/2024: pre-charting, chart review, note writing, documentation discussion of test results, diagnostic or treatment recommendations, prognosis, risks and benefits  of management options, instructions, education, compliance or risk-factor reduction. It excludes time spent by the CMA or office staff in the care of the patient. Actual time 40 min)   SIGNATURE    Dr. Dorethia Cave, M.D., F.C.C.P,  Pulmonary and Critical Care Medicine Staff Physician, [facility] Center Director - Interstitial Lung Disease  Program  Pulmonary Fibrosis [facility] at [facility] [place], [place], [address]  Pager: [phone], If no answer or between  15:00h - 7:00h: call [phone] Telephone: [phone]  5:52 PM 06/10/2024

## 2024-06-10 NOTE — Patient Instructions (Addendum)
 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 Mean pressure Hypertensio with [name] at dose iniitation - May 2025 Bronchospasm with Olga -July 2025  - as of 06/10/2024 - tyvaso neb and tolerating it with some mild side effects  Plan  - Marked treprostinil DPI and Yutrepia as allergy  - Continue treprostinil nebulizer   ILD (interstitial lung disease) (HCC) Drug-induced pneumonitis  - Fibrosis is worse;  - intolerance to anti fibrotics esbreit and ofev  - on low dose prednisone  since early July 2025  Plan - supportive care -  continue empiric prednisone  10 mg/day - await Nerandomilast approval - do spirometry in 4-8 weeks  Chronic respiratory failure  -Progressively getting worse  Plan  - continue o2 as before [4 L at rest, 10 L with exertion] - emailed [facility] team to see if you might qualify for luing transplant  Myeloma    M protein < 0.8 in July 2024 and 0.5 in March 2025 and 0.6 in April 2025 and 0.7 in July 2025  Plan  - per [facility] and Dr Federico - Hopefully empiric prednisone  10 mg/day can help with this  Anxiety Chronic cough Class IV dyspnea  - affecting quality of life but glad you are now talking about it and addressing it - noted that you have seen outpatient palliative care at the cancer center  Plan  - meditation + acupuncuture  - continue Xanax  - Continue Hycodan cough syrup 5 mg x 6  times daily as needed - REach out to outpatient palliative care clinic - Nexk step consider scheduled oral morphine syrup  Goals of care  -Life expectancy is diminished and we talked about it; suspect < 1 year based on current quality of life  Plan  -Focus on quality of life issues and treat the treatable - Optimize to the extent that you can still continue to work - Do MOST FORM (sent via email)  -   Followup - 4-8 weeks after PFT; 30 minutes

## 2024-06-11 ENCOUNTER — Other Ambulatory Visit: Payer: Self-pay | Admitting: Hematology and Oncology

## 2024-06-11 ENCOUNTER — Other Ambulatory Visit: Payer: Self-pay | Admitting: *Deleted

## 2024-06-11 DIAGNOSIS — E7800 Pure hypercholesterolemia, unspecified: Secondary | ICD-10-CM

## 2024-06-11 DIAGNOSIS — R5383 Other fatigue: Secondary | ICD-10-CM

## 2024-06-11 DIAGNOSIS — C9000 Multiple myeloma not having achieved remission: Secondary | ICD-10-CM

## 2024-06-14 ENCOUNTER — Telehealth: Payer: Self-pay

## 2024-06-14 NOTE — Telephone Encounter (Signed)
 Copied from CRM [id number]. Topic: Clinical - Medication Question >> Jun 14, 2024 12:20 PM Rozanna MATSU wrote: Reason for CRM: JENNIFER WITH [facility] STATEED SHE FAXEED THE PLAN OF CARE ON 08/18 AND TODAY FOR PATIENT, STATED TO PLEASE SIGN ALL THREE PAGES AND SEND BACK ANY QUESTIONS PLEASE REACH HER [phone]. WANTED TO MAKE SURE THE PAPERS WHERE REC'D  Routing to [place] to follow-up on paperwork.

## 2024-06-15 NOTE — Telephone Encounter (Signed)
 Copied from CRM [id number]. Topic: General - Other >> Jun 15, 2024  2:32 PM Corean SAUNDERS wrote: Reason for CRM: Nurse Delon with [facility] states the fax received from Dr. Geronimo is not legible and is kindly requesting this be re sent to her direct fax at [phone] or sent to her email [email or web address]    For any questions please call Delon at [phone] (confidential voicemail)

## 2024-06-15 NOTE — Telephone Encounter (Signed)
 Forms were signed and I have faxed them to [facility] [employer].

## 2024-06-16 ENCOUNTER — Inpatient Hospital Stay

## 2024-06-16 DIAGNOSIS — R5383 Other fatigue: Secondary | ICD-10-CM

## 2024-06-16 DIAGNOSIS — Z79899 Other long term (current) drug therapy: Secondary | ICD-10-CM | POA: Diagnosis not present

## 2024-06-16 DIAGNOSIS — Z7952 Long term (current) use of systemic steroids: Secondary | ICD-10-CM | POA: Diagnosis not present

## 2024-06-16 DIAGNOSIS — Z79624 Long term (current) use of inhibitors of nucleotide synthesis: Secondary | ICD-10-CM | POA: Diagnosis not present

## 2024-06-16 DIAGNOSIS — C9000 Multiple myeloma not having achieved remission: Secondary | ICD-10-CM

## 2024-06-16 DIAGNOSIS — Z79890 Hormone replacement therapy (postmenopausal): Secondary | ICD-10-CM | POA: Diagnosis not present

## 2024-06-16 DIAGNOSIS — E7800 Pure hypercholesterolemia, unspecified: Secondary | ICD-10-CM

## 2024-06-16 DIAGNOSIS — Z87891 Personal history of nicotine dependence: Secondary | ICD-10-CM | POA: Diagnosis not present

## 2024-06-16 DIAGNOSIS — Z9981 Dependence on supplemental oxygen: Secondary | ICD-10-CM | POA: Diagnosis not present

## 2024-06-16 LAB — CMP (CANCER CENTER ONLY)
ALT: 17 U/L (ref 0–44)
AST: 25 U/L (ref 15–41)
Albumin: 3.8 g/dL (ref 3.5–5.0)
Alkaline Phosphatase: 47 U/L (ref 38–126)
Anion gap: 3 — ABNORMAL LOW (ref 5–15)
BUN: 16 mg/dL (ref 8–23)
CO2: 35 mmol/L — ABNORMAL HIGH (ref 22–32)
Calcium: 9.2 mg/dL (ref 8.9–10.3)
Chloride: 94 mmol/L — ABNORMAL LOW (ref 98–111)
Creatinine: 0.76 mg/dL (ref 0.61–1.24)
GFR, Estimated: >60 mL/min (ref >60)
Glucose, Bld: 94 mg/dL (ref 70–99)
Potassium: 4.3 mmol/L (ref 3.5–5.1)
Sodium: 132 mmol/L — ABNORMAL LOW (ref 135–145)
Total Bilirubin: 0.4 mg/dL (ref 0.0–1.2)
Total Protein: 7.8 g/dL (ref 6.5–8.1)

## 2024-06-16 LAB — LIPID PANEL
Cholesterol: 133 mg/dL (ref 0–200)
HDL: 46 mg/dL (ref >40)
LDL Cholesterol: 62 mg/dL (ref 0–99)
Total CHOL/HDL Ratio: 2.9 ratio
Triglycerides: 124 mg/dL (ref <150)
VLDL: 25 mg/dL (ref 0–40)

## 2024-06-16 LAB — CBC WITH DIFFERENTIAL (CANCER CENTER ONLY)
Abs Immature Granulocytes: 0.01 K/uL (ref 0.00–0.07)
Basophils Absolute: 0 K/uL (ref 0.0–0.1)
Basophils Relative: 0 %
Eosinophils Absolute: 0.1 K/uL (ref 0.0–0.5)
Eosinophils Relative: 1 %
HCT: 39.4 % (ref 39.0–52.0)
Hemoglobin: 13.4 g/dL (ref 13.0–17.0)
Immature Granulocytes: 0 %
Lymphocytes Relative: 13 %
Lymphs Abs: 1 K/uL (ref 0.7–4.0)
MCH: 31.2 pg (ref 26.0–34.0)
MCHC: 34 g/dL (ref 30.0–36.0)
MCV: 91.6 fL (ref 80.0–100.0)
Monocytes Absolute: 0.5 K/uL (ref 0.1–1.0)
Monocytes Relative: 6 %
Neutro Abs: 6.1 K/uL (ref 1.7–7.7)
Neutrophils Relative %: 80 %
Platelet Count: 242 K/uL (ref 150–400)
RBC: 4.3 MIL/uL (ref 4.22–5.81)
RDW: 12.9 % (ref 11.5–15.5)
WBC Count: 7.7 K/uL (ref 4.0–10.5)
nRBC: 0 % (ref 0.0–0.2)

## 2024-06-16 LAB — TSH: TSH: 12.1 u[IU]/mL — ABNORMAL HIGH (ref 0.350–4.500)

## 2024-06-16 LAB — LACTATE DEHYDROGENASE: LDH: 248 U/L — ABNORMAL HIGH (ref 98–192)

## 2024-06-17 LAB — KAPPA/LAMBDA LIGHT CHAINS
Kappa free light chain: 41.1 mg/L — ABNORMAL HIGH (ref 3.3–19.4)
Kappa, lambda light chain ratio: 1.99 — ABNORMAL HIGH (ref 0.26–1.65)
Lambda free light chains: 20.7 mg/L (ref 5.7–26.3)

## 2024-06-18 NOTE — Telephone Encounter (Signed)
 Faxed back to [facility] 06/18/24

## 2024-06-21 LAB — MULTIPLE MYELOMA PANEL, SERUM
Albumin SerPl Elph-Mcnc: 3.2 g/dL (ref 2.9–4.4)
Albumin/Glob SerPl: 0.8 (ref 0.7–1.7)
Alpha 1: 0.3 g/dL (ref 0.0–0.4)
Alpha2 Glob SerPl Elph-Mcnc: 0.9 g/dL (ref 0.4–1.0)
B-Globulin SerPl Elph-Mcnc: 1.1 g/dL (ref 0.7–1.3)
Gamma Glob SerPl Elph-Mcnc: 1.9 g/dL — ABNORMAL HIGH (ref 0.4–1.8)
Globulin, Total: 4.2 g/dL — ABNORMAL HIGH (ref 2.2–3.9)
IgA: 373 mg/dL (ref 61–437)
IgG (Immunoglobin G), Serum: 2057 mg/dL — ABNORMAL HIGH (ref 603–1613)
IgM (Immunoglobulin M), Srm: 127 mg/dL (ref 20–172)
M Protein SerPl Elph-Mcnc: 0.9 g/dL — ABNORMAL HIGH
Total Protein ELP: 7.4 g/dL (ref 6.0–8.5)

## 2024-06-22 ENCOUNTER — Telehealth (HOSPITAL_BASED_OUTPATIENT_CLINIC_OR_DEPARTMENT_OTHER): Payer: Self-pay

## 2024-06-22 NOTE — Telephone Encounter (Signed)
 Copied from CRM [id number]. Topic: Clinical - Prescription Issue >> Jun 22, 2024  3:40 PM Celestine FALCON wrote: Reason for CRM: [facility] pharmacy rep [place] is stating they did not receive the fax from [facility] from 06/18/2024 for Treprostinil ([name]) 0.6 MG/ML SOLN.  The best fax number is [phone].

## 2024-06-23 ENCOUNTER — Other Ambulatory Visit: Payer: Self-pay | Admitting: Hematology and Oncology

## 2024-06-23 NOTE — Telephone Encounter (Signed)
 Receiving nursing orders from [facility]. Signed and faxed back to [facility].   Phone: [phone] Fax: [phone]  Aleck Puls, PharmD, BCPS Clinical Pharmacist  [facility]

## 2024-06-25 ENCOUNTER — Other Ambulatory Visit: Payer: Self-pay | Admitting: Emergency Medicine

## 2024-06-25 DIAGNOSIS — J849 Interstitial pulmonary disease, unspecified: Secondary | ICD-10-CM

## 2024-06-27 ENCOUNTER — Telehealth: Payer: Self-pay | Admitting: Internal Medicine

## 2024-06-27 NOTE — Telephone Encounter (Signed)
 Samuel [name] emailed me directly stating that , :  first I checked again (Sunday) with [facility] and they have still not heard from your office on the meds reorder.   I have 4 days left.  [facility] says they reached out but have gotten no response.  Please sort out  Thanks    SIGNATURE    Dr. Dorethia Cave, M.D., F.C.C.P,  Pulmonary and Critical Care Medicine Staff Physician, [facility] Director - Interstitial Lung Disease  Program  Pulmonary Fibrosis [facility] at [facility] [place], [place], [address]   Pager: [phone], If no answer  -> Check AMION or Try [phone] Telephone (clinical office): [phone] Telephone (research): [phone]  8:48 PM 06/27/2024

## 2024-06-28 ENCOUNTER — Ambulatory Visit: Payer: Self-pay | Admitting: Internal Medicine

## 2024-06-28 ENCOUNTER — Telehealth: Payer: Self-pay | Admitting: Internal Medicine

## 2024-06-28 NOTE — Telephone Encounter (Signed)
 Copied from CRM [id number]. Topic: Referral - Question >> Jun 28, 2024 12:59 PM Celestine FALCON wrote: Reason for CRM: Gennaro from [facility] out of [place], [place] is calling due to not receiving all the correct information on the referral.  She needs the following info: office visit notes from the last year, labs, chest x rays, Cts, PFTs, and 6 minute walk. She also needs pt demographics, address, and insurance of the pt to see if there is transplant benefits including in their plan.   Please fax to [phone] attention Jamika.

## 2024-06-28 NOTE — Telephone Encounter (Signed)
**Note De-identified  [place] Obfuscation** Please advise 

## 2024-06-28 NOTE — Telephone Encounter (Signed)
 Pt scheduled per [facility] clinician request with DOROTHA Dandy, PA. Pt requesting office O2 tanks to switch to upon arriving to reserve home tanks.

## 2024-06-28 NOTE — Telephone Encounter (Signed)
 Contacted [facility]. Per representative, the request for new Rx was sent to incorrect fax number. Asked representative to update fax number to [phone] for future requests, which is monitored by [facility] Pulmonary pharmacy team.   Verbal order for [name] refill provided over the phone on 06/28/24.  Aleck Puls, PharmD, BCPS Clinical Pharmacist  [facility]

## 2024-06-28 NOTE — Telephone Encounter (Signed)
 After fax number updated, received refill request for [name] via fax. Uploaded to chart for documentation of verbal that was provided.   Contacted patient to alert him of above. He verbalizes understanding.   Aleck Puls, PharmD, BCPS Clinical Pharmacist  [facility]

## 2024-06-28 NOTE — Telephone Encounter (Signed)
 Patient states he wants Pulmonologist's advice on being able to breathe out of his nose---he states that he flushes it and breathes fine through it but it clogs right back up Patient also states that the medication [name] is giving him side effects at the 4pm dose and the 8pm (4pm is worse than the 8pm) He wants Samuel [name] advice prior to making an appointment   FYI Only or Action Required?: Action required by provider: clinical question for provider and update on patient condition.  Patient is followed in Pulmonology for ILD, last seen on 06/10/2024 by Samuel Amel, MD.  Called Nurse Triage reporting Medication Problem.  Symptoms began Since last visit with Samuel Samuel.  Interventions attempted: OTC medications: Zyrtec , Prescription medications: daily prescribed meds, Rescue inhaler, Maintenance inhaler, Nebulizer treatments, Home oxygen use, and Increased fluids/rest.  Symptoms are: gradually worsening.  Triage Disposition: Call PCP When Office is Open  Patient/caregiver understands and will follow disposition?: Yes         Copied from CRM [id number]. Topic: Clinical - Red Word Triage >> Jun 28, 2024  2:21 PM Samuel [name] wrote: Red Word that prompted transfer to Nurse Triage: Pharmacy is calling regarding Samuel [name]'s Treprostinil ([name]) 0.6 MG/ML SOLN, she stated that he has been experiencing bronchial spasms when taking 1-2 breaths of the treatment and he is concerned that this could cause some worsening symptoms/issues. Reason for Disposition  [1] Caller has NON-URGENT medicine question about med that PCP prescribed AND [2] triager unable to answer question  Answer Assessment - Initial Assessment Questions Patient states that he can't get air into his nose--severe nasal congestion--Patient states he flushes with saline solution and everything feels better but he states it clogs right back up ENT and Allergist told patient there was only so much they could do  Patient  states he gets bronchospasms from this medication Treprostinil  Patient started Zyrtec  yesterday and Montelukast  Samuel Frutoso gave patient a round of steroids and a cortisone shot per patient on Friday 3 days ago  --- Pharmacy told him that he shouldn't have side effects like this  Wears oxygen continuously 95-98% is usually his oxygen saturation Patient states that Saturday (2 days ago) he forgot to turn his Oxygen on and he started walking and his oxygen saturation dropped to 40% but he states he recovered quickly  Treprostinil ([name])-- Inhale into the lungs 4 (four) times daily. Inhale 3 breaths per treatment session four times daily. Increase weekly to goal dose 9 to 12 breaths per treatment session four times daily  Albuterol  Nebulizer--USE ONE VIAL IN NEBULIZER EVERY 6 HOURS AS NEEDED WHEEZING OR SHORTNESS OF BREATH  Albuterol  Inhaler-- Inhale 2 puffs into the lungs every 6 hours as needed for wheezing or shortness of breath.  Patient states that he does have fluid buildup in his feet and maybe chest but Samuel Samuel put him on Lasix  and he feels a little better---he states there is still some swelling  Patient states the first two doses of [name] he does okay with, the 4pm dose he has trouble with and the 8pm as well but not as bad as the 4pm  Patient states he feels a little worse than when he saw Samuel Samuel last except for when he exercised---he states he felt pretty good then  Patient states that he feels alright at the moment but he is just resting Patient is advised that if anything worsens to go to the Emergency Room. Patient verbalized understanding.  Protocols used: Medication Question  Call-A-AH

## 2024-06-29 ENCOUNTER — Encounter (HOSPITAL_BASED_OUTPATIENT_CLINIC_OR_DEPARTMENT_OTHER): Payer: Self-pay

## 2024-06-29 ENCOUNTER — Ambulatory Visit (HOSPITAL_BASED_OUTPATIENT_CLINIC_OR_DEPARTMENT_OTHER)

## 2024-06-29 VITALS — BP 134/80 | HR 58 | Ht 70.0 in | Wt 187.0 lb

## 2024-06-29 DIAGNOSIS — J45909 Unspecified asthma, uncomplicated: Secondary | ICD-10-CM | POA: Diagnosis not present

## 2024-06-29 DIAGNOSIS — J9611 Chronic respiratory failure with hypoxia: Secondary | ICD-10-CM | POA: Diagnosis not present

## 2024-06-29 DIAGNOSIS — I2723 Pulmonary hypertension due to lung diseases and hypoxia: Secondary | ICD-10-CM

## 2024-06-29 DIAGNOSIS — I272 Pulmonary hypertension, unspecified: Secondary | ICD-10-CM

## 2024-06-29 DIAGNOSIS — Z87891 Personal history of nicotine dependence: Secondary | ICD-10-CM

## 2024-06-29 DIAGNOSIS — J849 Interstitial pulmonary disease, unspecified: Secondary | ICD-10-CM

## 2024-06-29 NOTE — Telephone Encounter (Signed)
 Called and spoke with Glade at [facility]. She confirmed fax number is [phone]. I was unable to get fax to go through due to communication error.   I have verified address [address] [place]  [address].  Envelope has been placed upfront to be mailed.

## 2024-06-29 NOTE — Progress Notes (Signed)
 @Patient  ID: Samuel [name], male    DOB: 1956/10/09, 69 y.o.   MRN: [id number]  No chief complaint on file.   Referring provider: Geronimo Amel, MD  HPI: Samuel [name] is a 68 y/o male with PMH of asthma, ILD, drug-induced pneumonitis, chronic respiratory failure, pulmonary HTN on inhaled [name], and multiple myeloma who presents today for follow up.  He reports that he has been taking the inhaled [name] 4 times a day.  He struggles with stuffy nose and recently saw the ENT on Friday.  They recommended nasal saline, Astepro , and nasal steroid.  Today we reviewed his medications and he indicates that he has been doing the saline but has not been using the Astepro  or nasal steroid on a regular basis despite continuing to struggle with nasal stuffiness.  He is also concerned about having some bronchospasms and how they may be related to his asthma.  We did discuss his asthma management today.  He is currently on Breztri  and has been tolerating this twice daily.  He reports that he is only using albuterol  1-2 times per day.  He denies productive cough, fever, chills, night sweats, weight changes.  He had also reported some issues with getting his prescription through [facility], however, he states that this has been resolved.  TEST/EVENTS : n/a  Allergies  Allergen Reactions   Amlodipine  Swelling   Beclomethasone Other (See Comments)    unknown   Ciclesonide Other (See Comments)    unknown   Clarithromycin Other (See Comments)    Hiccups   Ofev  [Nintedanib] Other (See Comments)    GI bleeding   Penicillins Other (See Comments)    Child hood unsure   Rosuvastatin      Other Reaction(s): hand swelling   Trazodone     Other Reaction(s): sweating   Olga [Treprostinil Sodium] Cough    Bronchial spasms, sore throat.    Immunization History  Administered Date(s) Administered   Fluad Quad(high Dose 65+) 08/09/2021, 08/01/2022, 07/22/2023   Fluzone Influenza virus  vaccine,trivalent (IIV3), split virus 10/27/2017   INFLUENZA, HIGH DOSE SEASONAL PF 12/01/2017, 11/29/2019, 12/19/2020, 12/24/2021   Influenza, Quadrivalent, Recombinant, Inj, Pf 07/28/2019, 08/11/2020   Influenza-Unspecified 11/26/2011, 07/21/2017   PFIZER(Purple Top)SARS-COV-2 Vaccination 12/27/2019, 01/24/2020, 09/21/2020   Tdap 08/20/2005, 09/07/2015   Zoster, Live 11/06/2017, 05/07/2018    Past Medical History:  Diagnosis Date   Asthma    GERD (gastroesophageal reflux disease)    High cholesterol    under control.    History of blood transfusion    Hypothyroidism    Interstitial lung disease (HCC)    Multiple myeloma (HCC)    Perennial allergic rhinitis    Pneumonia    Sciatic pain, right    Seasonal allergic rhinitis    Thyroid  disease     Tobacco History: Social History   Tobacco Use  Smoking Status Former   Current packs/day: 0.00   Average packs/day: 0.1 packs/day for 3.0 years (0.3 ttl pk-yrs)   Types: Cigarettes   Start date: 10/22/1979   Quit date: 10/21/1982   Years since quitting: 41.7  Smokeless Tobacco Never   Counseling given: Not Answered   Outpatient Medications Prior to Visit  Medication Sig Dispense Refill   acyclovir  (ZOVIRAX ) 400 MG tablet TAKE ONE TABLET BY MOUTH TWICE DAILY 60 tablet 2   albuterol  (PROVENTIL ) (2.5 MG/3ML) 0.083% nebulizer solution USE ONE VIAL IN NEBULIZER EVERY 6 HOURS AS NEEDED WHEEZING OR SHORTNESS OF BREATH 120 mL 12   albuterol  (  VENTOLIN  HFA) 108 (90 Base) MCG/ACT inhaler Inhale 2 puffs into the lungs every 6 hours as needed for wheezing or shortness of breath. 8.5 g 1   ALPRAZolam  (XANAX ) 0.5 MG tablet Take 0.5 mg by mouth at bedtime.     amLODipine  (NORVASC ) 5 MG tablet Take 1 tablet (5 mg total) by mouth daily. 90 tablet 3   ASSESS FULL RANGE PEAK METER DEVI as directed.     azelastine  (ASTELIN ) 0.1 % nasal spray Place 2 sprays into both nostrils 2 (two) times daily. Use in each nostril as directed 30 mL 12   bisoprolol   (ZEBETA ) 5 MG tablet Take 1 tablet (5 mg total) by mouth daily. 30 tablet 2   Budeson-Glycopyrrol-Formoterol (BREZTRI  AEROSPHERE) 160-9-4.8 MCG/ACT AERO Inhale 2 puffs into the lungs in the morning and at bedtime. 10.7 g 11   cetirizine  (ZYRTEC ) 10 MG tablet Take 1 tablet (10 mg total) by mouth daily. 30 tablet 11   cholecalciferol (VITAMIN D) 1000 UNITS tablet Take 3,000 Units by mouth daily.     dextromethorphan (DELSYM) 30 MG/5ML liquid Take 30 mg by mouth as needed for cough. (Patient not taking: Reported on 06/10/2024)     EPINEPHrine  (EPI-PEN) 0.3 mg/0.3 mL DEVI Inject 0.3 mg into the muscle as needed.     ezetimibe -simvastatin  (VYTORIN ) 10-40 MG tablet Take 1 tablet by mouth daily. 90 tablet 3   fexofenadine (ALLEGRA) 180 MG tablet Take 180 mg by mouth daily.     fluticasone  (FLONASE) 50 MCG/ACT nasal spray Place 1 spray into both nostrils daily as needed for allergies or rhinitis.     furosemide  (LASIX ) 40 MG tablet Take 1 tablet (40 mg total) by mouth daily as needed. 30 tablet 0   [START ON 07/11/2024] HYDROcodone  bit-homatropine (HYCODAN) 5-1.5 MG/5ML syrup Take 5 mLs by mouth every 4 (four) hours as needed for cough. 900 mL 0   HYDROcodone  bit-homatropine (HYCODAN) 5-1.5 MG/5ML syrup Take 5 mLs by mouth every 4 (four) hours as needed for cough. 900 mL 0   ibuprofen (ADVIL) 200 MG tablet Take 200 mg by mouth every 6 (six) hours as needed for moderate pain (pain score 4-6).     ketoconazole (NIZORAL) 2 % cream Apply 1 Application topically 2 (two) times daily as needed for irritation.     levothyroxine  (SYNTHROID ) 50 MCG tablet Take 50 mcg by mouth every morning.     liothyronine (CYTOMEL) 5 MCG tablet Take 5 mcg by mouth daily.     montelukast  (SINGULAIR ) 10 MG tablet Take 10 mg by mouth at bedtime.     Multiple Vitamin (MULTIVITAMIN) tablet Take 1 tablet by mouth daily.     omeprazole  (PRILOSEC) 40 MG capsule Take 1 capsule (40 mg total) by mouth 2 (two) times daily. Take 30 minutes  before a meal 60 capsule 3   OXYGEN Inhale 2 L into the lungs continuous.     potassium chloride  SA (KLOR-CON  M20) 20 MEQ tablet Take 1 tablet (20 mEq total) by mouth daily. 30 tablet 0   predniSONE  (DELTASONE ) 10 MG tablet Take 1 tablet (10 mg total) by mouth daily with breakfast. (Patient taking differently: Take 5 mg by mouth daily with breakfast.) 30 tablet 6   sildenafil (REVATIO) 20 MG tablet Take 20 mg by mouth daily as needed (ED). (Patient not taking: Reported on 06/10/2024)     Simethicone  (SIMETHICONE  ULTRA STRENGTH) 180 MG CAPS Take 1 capsule (180 mg total) by mouth 3 (three) times daily as needed. 90 capsule 0  sodium chloride  (OCEAN) 0.65 % SOLN nasal spray Place 1 spray into both nostrils as needed for congestion.     tamsulosin  (FLOMAX ) 0.4 MG CAPS capsule Take 0.4 mg by mouth daily.     Treprostinil ([name]) 0.6 MG/ML SOLN Inhale into the lungs 4 (four) times daily. Inhale 3 breaths per treatment session four times daily. Increase weekly to goal dose 9 to 12 breaths per treatment session four times daily     triamcinolone  cream (KENALOG ) 0.1 % Apply 1 application topically daily as needed (sun burn itch).     Turmeric 400 MG CAPS Take 1 capsule by mouth daily.     No facility-administered medications prior to visit.     Review of Systems: as per HPI  Constitutional:   No  weight loss, night sweats,  Fevers, chills, fatigue, or  lassitude.  HEENT:   No headaches,  Difficulty swallowing,  Tooth/dental problems, or  Sore throat,                No sneezing, itching, ear ache, nasal congestion, post nasal drip,   CV:  No chest pain,  Orthopnea, PND, swelling in lower extremities, anasarca, dizziness, palpitations, syncope.   GI  No heartburn, indigestion, abdominal pain, nausea, vomiting, diarrhea, change in bowel habits, loss of appetite, bloody stools.   Resp: No shortness of breath with exertion or at rest.  No excess mucus, no productive cough,  No non-productive cough,  No  coughing up of blood.  No change in color of mucus.  No wheezing.  No chest wall deformity  Skin: no rash or lesions.  GU: no dysuria, change in color of urine, no urgency or frequency.  No flank pain, no hematuria   MS:  No joint pain or swelling.  No decreased range of motion.  No back pain.    Physical Exam  BP 134/80   Pulse (!) 58   Ht 5' 10 (1.778 m)   Wt 187 lb (84.8 kg)   SpO2 100% Comment: 6 liters  BMI 26.83 kg/m   GEN: A/Ox3; pleasant , NAD, well nourished.  Wearing oxygen; speaks in full sentences.   HEENT:  [place]/AT,  EACs-clear, TMs-wnl, NOSE-clear, THROAT-clear, no lesions, no postnasal drip or exudate noted.   NECK:  Supple w/ fair ROM; no JVD; normal carotid impulses w/o bruits; no thyromegaly or nodules palpated; no lymphadenopathy.    RESP  Velcro crackles at bases bilaterally;  w/o, wheezes  or rhonchi. no accessory muscle use, no dullness to percussion  CARD:  RRR, no m/r/g, no peripheral edema, pulses intact, no cyanosis or clubbing.  GI:   Soft & nt; nml bowel sounds; no organomegaly or masses detected.   Musco: Warm bil, no deformities or joint swelling noted.   Neuro: alert, no focal deficits noted.    Skin: Warm, no lesions or rashes    Lab Results:  CBC    Component Value Date/Time   WBC 7.7 06/16/2024 0843   WBC 5.6 09/12/2023 1743   RBC 4.30 06/16/2024 0843   HGB 13.4 06/16/2024 0843   HCT 39.4 06/16/2024 0843   PLT 242 06/16/2024 0843   MCV 91.6 06/16/2024 0843   MCH 31.2 06/16/2024 0843   MCHC 34.0 06/16/2024 0843   RDW 12.9 06/16/2024 0843   LYMPHSABS 1.0 06/16/2024 0843   MONOABS 0.5 06/16/2024 0843   EOSABS 0.1 06/16/2024 0843   BASOSABS 0.0 06/16/2024 0843    BMET    Component Value Date/Time   NA  132 (L) 06/16/2024 0843   K 4.3 06/16/2024 0843   CL 94 (L) 06/16/2024 0843   CO2 35 (H) 06/16/2024 0843   GLUCOSE 94 06/16/2024 0843   BUN 16 06/16/2024 0843   CREATININE 0.76 06/16/2024 0843   CALCIUM  9.2 06/16/2024  0843   GFRNONAA >60 06/16/2024 0843    BNP    Component Value Date/Time   BNP 32.3 06/15/2021 1416    ProBNP    Component Value Date/Time   PROBNP 35.0 09/12/2023 1018    Imaging: No results found.  Administration History     None          Latest Ref Rng & Units 06/12/2023   11:30 AM 01/17/2023    2:09 PM 09/26/2022   10:34 AM 08/01/2022    1:57 PM 06/06/2022    1:52 PM 12/10/2021    9:24 AM 10/16/2021    3:56 PM  PFT Results  FVC-Pre L 1.19  1.36  1.32  1.55  1.43  1.49  1.35   FVC-Predicted Pre % 26  29  28   33  30  32  29   Pre FEV1/FVC % % 88  90  85  94  88  94  92   FEV1-Pre L 1.05  1.23  1.12  1.46  1.26  1.40  1.24   FEV1-Predicted Pre % 30  36  32  42  36  40  36   DLCO uncorrected ml/min/mmHg 9.36  12.92  9.61  10.43  13.19  8.64  9.99   DLCO UNC% % 35  48  36  39  49  32  37   DLCO corrected ml/min/mmHg 9.36  13.27  9.96  10.58  13.77  9.20  10.04   DLCO COR %Predicted % 35  49  37  39  51  34  37   DLVA Predicted % 126  127  107  110  128  97  109     No results found for: NITRICOXIDE   Assessment & Plan:  Samuel [name] is a 68 y/o male with PMH of asthma, ILD, drug-induced pneumonitis, chronic respiratory failure, pulmonary HTN on inhaled [name], and multiple myeloma who presents today for follow up.  His main concern is that of nasal stuffiness, bronchospasms, and asthma control.  His feelings of dyspnea are multifactorial in the setting of ILD, drug-induced pneumonitis, asthma, MM, and anxiety. -  We reviewed his asthma treatment plan today.  Continue Breztri  BID and use Albuterol  prn.   -  Consider adding Biologic treatment such as Dupixent to his regimen; plan to discuss with Dr. Geronimo.  Informational packet provided to patient and wife today. -  Continue inhaled [name].  Use Albuterol  inhaler half an hour prior to using [name] to help with bronchospasm. -  Continue saline nasal spray.  Resume Astelin  and Flonase.  May use Flonase  BID. -  plan for close follow up in one month with Dr. Geronimo; sooner if needed  Assessment & Plan ILD (interstitial lung disease) (HCC)  WHO group 3 pulmonary arterial hypertension (HCC)  Chronic respiratory failure with hypoxia (HCC)  Asthma, unspecified asthma severity, unspecified whether complicated, unspecified whether persistent    Return in about 4 weeks (around 07/27/2024) for with Dr. Geronimo.  Candis Dandy, PA-C 06/29/2024

## 2024-06-29 NOTE — Patient Instructions (Signed)
 Take Albuterol  MDI 1-2 puffs inhaled half an hour prior to taking inhaled [name].  Start Flonase BID as instructed.  Follow up in one month with Dr. Geronimo; sooner if new or worsening symptoms  Review information regarding Dupixent injections.

## 2024-06-30 NOTE — Telephone Encounter (Signed)
 Wsa this done?

## 2024-06-30 NOTE — Telephone Encounter (Signed)
 Yes.  We switched tanks in office and he was seen yesterday.

## 2024-07-06 NOTE — Telephone Encounter (Signed)
 Jamika from [facility] calling back concerning the records for patient . Ms gennaro was going to close referral but I relayed the message that I see that they are going to be mailed . Its holding up progress . If someone want to call her back and maybe email it .  [phone]

## 2024-07-06 NOTE — Telephone Encounter (Signed)
 Transferred to Ms marval at [facility]

## 2024-07-07 ENCOUNTER — Telehealth (INDEPENDENT_AMBULATORY_CARE_PROVIDER_SITE_OTHER): Payer: Self-pay

## 2024-07-07 NOTE — Telephone Encounter (Signed)
 Patient called wanting to get some medication for his throat burning. Patient explains that he is taking his medications as prescribed previously, but wants to know if there is another medication that can help. Please advise.

## 2024-07-08 ENCOUNTER — Encounter (INDEPENDENT_AMBULATORY_CARE_PROVIDER_SITE_OTHER): Payer: Self-pay

## 2024-07-08 NOTE — Telephone Encounter (Signed)
See recent encounter.

## 2024-07-13 ENCOUNTER — Other Ambulatory Visit: Payer: Self-pay | Admitting: Physician Assistant

## 2024-07-13 DIAGNOSIS — C9000 Multiple myeloma not having achieved remission: Secondary | ICD-10-CM

## 2024-07-13 NOTE — Telephone Encounter (Signed)
 Received fax from [facility] regarding [name]: Your patient informed us  that therapy has been discontinued. Last OV with Candis Dandy, NP on 06/29/24 - patient having bronchospasm on nebulized [name]. Plan to use albuterol  inhaler half an hour prior to using [name] to help.   Called patient for update on use of [name].   Patient and spouse give the following updates:  - Stopped [name] nebulizer about a week ago due to side effects (bronchospasm, cough, sore/burning throat). Tried using albuterol  prior to [name] without significant improvement.  - When on [name], only able to tolerate 2-3 doses per day, 1 breath per dose - Since stopping [name], sore throat improved but remains significant complaint. - Complains of coughing and bronchospasm that occurred while he was laying in bed last night, resulting in desaturation to 80s on 10 L oxygen  He wants to give [name] another try but isn't sure how to navigate significant, persistent side effects. Previously trialed [name] DPI and [place]. Now unable to tolerate [name] nebulizer. Recommend to hold off until he discusses with Dr. Geronimo at upcoming follow-up.  He brings forward significant concern regarding the following:  - Sinuses feeling closed up, nasal stuffiness - despite use of nasal sprays  - Severe sore throat, which he attributes to Breztri  and [name] (now stopped [name] with some improvement in sore throat). We discussed he could try Cepacol lozenges over the counter for temporary symptom relief.  - Bronchospasm last night. He questions if this is related to asthma. See discussion with Candis Dandy, NP on 06/29/24. Plan to discuss Dupixent with Dr. Geronimo at follow-up.   Overall, he reports no significant changes in symptoms since last OV with Candis Dandy, NP.   Plan to follow-up with Dr. Geronimo on 07/29/24. Advised to contact our office sooner if symptoms worsen.   Aleck Puls, PharmD, BCPS Clinical Pharmacist  [facility]

## 2024-07-13 NOTE — Telephone Encounter (Signed)
 He was asking about doing biologic for asthma. I am ok with this but he has an allergist Dr Frutoso who can try tezpire

## 2024-07-14 ENCOUNTER — Inpatient Hospital Stay: Attending: Hematology and Oncology

## 2024-07-14 ENCOUNTER — Inpatient Hospital Stay (HOSPITAL_BASED_OUTPATIENT_CLINIC_OR_DEPARTMENT_OTHER): Admitting: Physician Assistant

## 2024-07-14 VITALS — BP 139/89 | HR 55 | Temp 97.3°F | Resp 19 | Wt 184.8 lb

## 2024-07-14 DIAGNOSIS — E039 Hypothyroidism, unspecified: Secondary | ICD-10-CM | POA: Insufficient documentation

## 2024-07-14 DIAGNOSIS — J45909 Unspecified asthma, uncomplicated: Secondary | ICD-10-CM | POA: Insufficient documentation

## 2024-07-14 DIAGNOSIS — Z79624 Long term (current) use of inhibitors of nucleotide synthesis: Secondary | ICD-10-CM | POA: Insufficient documentation

## 2024-07-14 DIAGNOSIS — E7800 Pure hypercholesterolemia, unspecified: Secondary | ICD-10-CM | POA: Diagnosis not present

## 2024-07-14 DIAGNOSIS — Z7952 Long term (current) use of systemic steroids: Secondary | ICD-10-CM | POA: Insufficient documentation

## 2024-07-14 DIAGNOSIS — Z803 Family history of malignant neoplasm of breast: Secondary | ICD-10-CM | POA: Diagnosis not present

## 2024-07-14 DIAGNOSIS — Z7961 Long term (current) use of immunomodulator: Secondary | ICD-10-CM | POA: Diagnosis not present

## 2024-07-14 DIAGNOSIS — Z79899 Other long term (current) drug therapy: Secondary | ICD-10-CM | POA: Insufficient documentation

## 2024-07-14 DIAGNOSIS — Z7969 Long term (current) use of other immunomodulators and immunosuppressants: Secondary | ICD-10-CM | POA: Insufficient documentation

## 2024-07-14 DIAGNOSIS — C9000 Multiple myeloma not having achieved remission: Secondary | ICD-10-CM

## 2024-07-14 DIAGNOSIS — Z87891 Personal history of nicotine dependence: Secondary | ICD-10-CM | POA: Diagnosis not present

## 2024-07-14 DIAGNOSIS — Z79890 Hormone replacement therapy (postmenopausal): Secondary | ICD-10-CM | POA: Insufficient documentation

## 2024-07-14 LAB — CMP (CANCER CENTER ONLY)
ALT: 20 U/L (ref 0–44)
AST: 25 U/L (ref 15–41)
Albumin: 4.1 g/dL (ref 3.5–5.0)
Alkaline Phosphatase: 46 U/L (ref 38–126)
Anion gap: 3 — ABNORMAL LOW (ref 5–15)
BUN: 22 mg/dL (ref 8–23)
CO2: 36 mmol/L — ABNORMAL HIGH (ref 22–32)
Calcium: 9.5 mg/dL (ref 8.9–10.3)
Chloride: 96 mmol/L — ABNORMAL LOW (ref 98–111)
Creatinine: 0.81 mg/dL (ref 0.61–1.24)
GFR, Estimated: >60 mL/min (ref >60)
Glucose, Bld: 112 mg/dL — ABNORMAL HIGH (ref 70–99)
Potassium: 3.9 mmol/L (ref 3.5–5.1)
Sodium: 135 mmol/L (ref 135–145)
Total Bilirubin: 0.4 mg/dL (ref 0.0–1.2)
Total Protein: 8.2 g/dL — ABNORMAL HIGH (ref 6.5–8.1)

## 2024-07-14 LAB — CBC WITH DIFFERENTIAL (CANCER CENTER ONLY)
Abs Immature Granulocytes: 0.01 K/uL (ref 0.00–0.07)
Basophils Absolute: 0 K/uL (ref 0.0–0.1)
Basophils Relative: 0 %
Eosinophils Absolute: 0.1 K/uL (ref 0.0–0.5)
Eosinophils Relative: 2 %
HCT: 42.2 % (ref 39.0–52.0)
Hemoglobin: 14.1 g/dL (ref 13.0–17.0)
Immature Granulocytes: 0 %
Lymphocytes Relative: 17 %
Lymphs Abs: 1.3 K/uL (ref 0.7–4.0)
MCH: 31.3 pg (ref 26.0–34.0)
MCHC: 33.4 g/dL (ref 30.0–36.0)
MCV: 93.6 fL (ref 80.0–100.0)
Monocytes Absolute: 0.5 K/uL (ref 0.1–1.0)
Monocytes Relative: 7 %
Neutro Abs: 5.7 K/uL (ref 1.7–7.7)
Neutrophils Relative %: 74 %
Platelet Count: 184 K/uL (ref 150–400)
RBC: 4.51 MIL/uL (ref 4.22–5.81)
RDW: 13 % (ref 11.5–15.5)
WBC Count: 7.7 K/uL (ref 4.0–10.5)
nRBC: 0 % (ref 0.0–0.2)

## 2024-07-14 LAB — LACTATE DEHYDROGENASE: LDH: 237 U/L — ABNORMAL HIGH (ref 98–192)

## 2024-07-14 NOTE — Progress Notes (Signed)
 [facility] [phone]   [phone]  ONCOLOGY PROGRESS NOTE  Patient Care Team: Seabron Lenis, MD as PCP - General (Family Medicine) Elmira Newman PARAS, MD as PCP - Cardiology (Cardiology)  Hematological/Oncological History # IgG Kappa Multiple Myeloma 02/02/2021:  Presented to [facility] due to right sided flank tenderness with bruising. CT abdomen/pelvis: Multiple small lytic lesions in the thoracolumbar spine and bilateral pelvis -SPEP: IgG 2,082 (H), M Protein 1.8 (H). IFE shows IgG monoclonal protein with kappa light chain specificity.  -LDH 169, CBC normal, CMP normal except for sodium 131 (L), Chloride 96 (L).   02/14/2021: Establish care with Johnston Police PA-C 02/22/2021: bone marrow biopsy confirms the diagnosis of Multiple Myeloma with a monoclonal plasma cell population.  03/16/2021: Cycle 1 Day 1 of VRd chemotherapy  04/06/2021: Cycle 2 Day 1 of VRd chemotherapy  04/27/2021: Cycle 3 Day 1 of VRd chemotherapy  05/18/2021: Cycle 4 Day 1 of VRd chemotherapy  06/01/2021: drop dexamethasone  to 20mg  PO weekly and start lasix  due to shortness of breath.  06/15/2021: Desaturation to 87% on ambulation. HELD velcade  today and sent to ED for evaluation.  06/22/2021: Findings consistent with drug reaction the lungs with eosinophils on BAL.  Given these findings we will definitely hold [name]  and plan to avoid pomalidomide 06/29/2021: Cycle 1 Day 1 CyBorD chemotherapy 07/05/2021: HOLD chemotherapy given worsening lung function  Interval History:  Samuel [name] 68 y.o. male with medical history significant for IgG Kappa multiple myeloma who presents for a follow up visit. The patient's last visit was on 04/21/2024. He is accompanied by his wife for this visit.  Samuel [name] reports he has continues have worsening pulmonary function. He reports he requires supplemental oxygen, up to 10 L with exertion. He continues to have chest and sinus congestion with no  improvement of nasal sprays and antihistamines. He denies nausea, vomiting or abdominal pain. He doe shave constipation which he takes miralax with some improvement. He denies easy bruising or signs of bleeding. He denies any fevers, chills, sweats, chest pain, headaches or dizziness.  A full 10 point ROS is otherwise negative.   MEDICAL HISTORY:  Past Medical History:  Diagnosis Date   Asthma    GERD (gastroesophageal reflux disease)    High cholesterol    under control.    History of blood transfusion    Hypothyroidism    Interstitial lung disease (HCC)    Multiple myeloma (HCC)    Perennial allergic rhinitis    Pneumonia    Sciatic pain, right    Seasonal allergic rhinitis    Thyroid  disease     SURGICAL HISTORY: Past Surgical History:  Procedure Laterality Date   BRONCHIAL BIOPSY  06/18/2021   Procedure: BRONCHIAL BIOPSIES;  Surgeon: Shelah Lamar RAMAN, MD;  Location: [facility];  Service: Cardiopulmonary;;   BRONCHIAL BIOPSY  08/28/2021   Procedure: BRONCHIAL BIOPSIES;  Surgeon: Brenna Adine CROME, DO;  Location: [facility];  Service: Cardiopulmonary;;   BRONCHIAL WASHINGS  06/18/2021   Procedure: BRONCHIAL WASHINGS;  Surgeon: Shelah Lamar RAMAN, MD;  Location: [facility];  Service: Cardiopulmonary;;   BRONCHIAL WASHINGS  08/28/2021   Procedure: BRONCHIAL WASHINGS;  Surgeon: Brenna Adine CROME, DO;  Location: [facility];  Service: Cardiopulmonary;;   NASAL SINUS SURGERY     NECK SURGERY  1996   RIGHT HEART CATH N/A 01/23/2024   Procedure: RIGHT HEART CATH;  Surgeon: Elmira Newman PARAS, MD;  Location: [facility];  Service: Cardiovascular;  Laterality:  N/A;   VIDEO BRONCHOSCOPY N/A 06/18/2021   Procedure: VIDEO BRONCHOSCOPY WITH FLUORO;  Surgeon: Shelah Lamar RAMAN, MD;  Location: [facility];  Service: Cardiopulmonary;  Laterality: N/A;   VIDEO BRONCHOSCOPY N/A 08/28/2021   Procedure: VIDEO BRONCHOSCOPY WITH FLUORO;  Surgeon: Brenna Adine CROME, DO;  Location: [facility];   Service: Cardiopulmonary;  Laterality: N/A;    SOCIAL HISTORY: Social History   Socioeconomic History   Marital status: Married    Spouse name: Not on file   Number of children: 3   Years of education: Not on file   Highest education level: Bachelor's degree (e.g., BA, AB, BS)  Occupational History   Not on file  Tobacco Use   Smoking status: Former    Current packs/day: 0.00    Average packs/day: 0.1 packs/day for 3.0 years (0.3 ttl pk-yrs)    Types: Cigarettes    Start date: 10/22/1979    Quit date: 10/21/1982    Years since quitting: 41.7   Smokeless tobacco: Never  Vaping Use   Vaping status: Never Used  Substance and Sexual Activity   Alcohol use: Not Currently    Comment: 1 drink daily   Drug use: No   Sexual activity: Not on file  Other Topics Concern   Not on file  Social History Narrative   Not on file   Social Drivers of Health   Financial Resource Strain: Low Risk  (03/15/2022)   Received from [facility]   Overall Financial Resource Strain (CARDIA)    Difficulty of Paying Living Expenses: Not hard at all  Food Insecurity: Low Risk  (01/14/2024)   Received from [facility]   Hunger Vital Sign    Within the past 12 months, you worried that your food would run out before you got money to buy more: Never true    Within the past 12 months, the food you bought just didn't last and you didn't have money to get more. : Never true  Transportation Needs: No Transportation Needs (01/14/2024)   Received from [facility]    In the past 12 months, has lack of reliable transportation kept you from medical appointments, meetings, work or from getting things needed for daily living? : No  Physical Activity: Insufficiently Active (01/10/2022)   Received from [facility]   Exercise Vital Sign    Days of Exercise per Week: 3 days    Minutes of Exercise per Session: 30 min  Stress: No Stress Concern Present (01/10/2022)   Received from [facility] - Occupational Stress Questionnaire    Feeling of Stress : Only a little  Social Connections: Socially Integrated (01/10/2022)   Received from [facility]   Social Connection and Isolation Panel    Frequency of Communication with Friends and Family: More than three times a week    Frequency of Social Gatherings with Friends and Family: Twice a week    Attends Religious Services: 1 to 4 times per year    Active Member of [employer] or Organizations: Yes    Attends Engineer, structural: More than 4 times per year    Marital Status: Married  Catering manager Violence: Not At Risk (03/15/2022)   Received from [facility]   Humiliation, Afraid, Rape, and Kick questionnaire    Fear of Current or Ex-Partner: No    Emotionally Abused: No    Physically Abused: No    Sexually Abused: No  FAMILY HISTORY: Family History  Problem Relation Age of Onset   Hypertension Mother    Allergies Mother    Allergies Father    CAD Father 77   Breast cancer Paternal Grandmother    Hypertension Other    Heart disease Other     ALLERGIES:  is allergic to amlodipine , beclomethasone, ciclesonide, clarithromycin, ofev  [nintedanib], penicillins, rosuvastatin , trazodone, and yutrepia [treprostinil sodium].  MEDICATIONS:  Current Outpatient Medications  Medication Sig Dispense Refill   acyclovir  (ZOVIRAX ) 400 MG tablet TAKE ONE TABLET BY MOUTH TWICE DAILY 60 tablet 2   albuterol  (PROVENTIL ) (2.5 MG/3ML) 0.083% nebulizer solution USE ONE VIAL IN NEBULIZER EVERY 6 HOURS AS NEEDED WHEEZING OR SHORTNESS OF BREATH 120 mL 12   albuterol  (VENTOLIN  HFA) 108 (90 Base) MCG/ACT inhaler Inhale 2 puffs into the lungs every 6 hours as needed for wheezing or shortness of breath. 8.5 g 1   ALPRAZolam  (XANAX ) 0.5 MG tablet Take 0.5 mg by mouth at bedtime.     ASSESS FULL RANGE PEAK METER DEVI as directed.     azelastine  (ASTELIN ) 0.1 % nasal spray Place 2 sprays into both nostrils 2 (two)  times daily. Use in each nostril as directed 30 mL 12   bisoprolol  (ZEBETA ) 5 MG tablet Take 1 tablet (5 mg total) by mouth daily. 30 tablet 2   Budeson-Glycopyrrol-Formoterol (BREZTRI  AEROSPHERE) 160-9-4.8 MCG/ACT AERO Inhale 2 puffs into the lungs in the morning and at bedtime. 10.7 g 11   cetirizine  (ZYRTEC ) 10 MG tablet Take 1 tablet (10 mg total) by mouth daily. 30 tablet 11   cholecalciferol (VITAMIN D) 1000 UNITS tablet Take 3,000 Units by mouth daily.     EPINEPHrine  (EPI-PEN) 0.3 mg/0.3 mL DEVI Inject 0.3 mg into the muscle as needed.     ezetimibe -simvastatin  (VYTORIN ) 10-40 MG tablet Take 1 tablet by mouth daily. 90 tablet 3   fexofenadine (ALLEGRA) 180 MG tablet Take 180 mg by mouth daily.     fluticasone  (FLONASE) 50 MCG/ACT nasal spray Place 1 spray into both nostrils daily as needed for allergies or rhinitis.     furosemide  (LASIX ) 40 MG tablet Take 1 tablet (40 mg total) by mouth daily as needed. 30 tablet 0   HYDROcodone  bit-homatropine (HYCODAN) 5-1.5 MG/5ML syrup Take 5 mLs by mouth every 4 (four) hours as needed for cough. 900 mL 0   ibuprofen (ADVIL) 200 MG tablet Take 200 mg by mouth every 6 (six) hours as needed for moderate pain (pain score 4-6).     ketoconazole (NIZORAL) 2 % cream Apply 1 Application topically 2 (two) times daily as needed for irritation.     levothyroxine  (SYNTHROID ) 50 MCG tablet Take 50 mcg by mouth every morning.     montelukast  (SINGULAIR ) 10 MG tablet Take 10 mg by mouth at bedtime.     Multiple Vitamin (MULTIVITAMIN) tablet Take 1 tablet by mouth daily.     omeprazole  (PRILOSEC) 40 MG capsule Take 1 capsule (40 mg total) by mouth 2 (two) times daily. Take 30 minutes before a meal 60 capsule 3   OXYGEN Inhale 2 L into the lungs continuous.     potassium chloride  SA (KLOR-CON  M20) 20 MEQ tablet Take 1 tablet (20 mEq total) by mouth daily. 30 tablet 0   predniSONE  (DELTASONE ) 10 MG tablet Take 1 tablet (10 mg total) by mouth daily with breakfast.  (Patient taking differently: Take 5 mg by mouth daily with breakfast.) 30 tablet 6   Simethicone  (SIMETHICONE  ULTRA STRENGTH) 180 MG  CAPS Take 1 capsule (180 mg total) by mouth 3 (three) times daily as needed. 90 capsule 0   sodium chloride  (OCEAN) 0.65 % SOLN nasal spray Place 1 spray into both nostrils as needed for congestion.     tamsulosin  (FLOMAX ) 0.4 MG CAPS capsule Take 0.4 mg by mouth daily.     thyroid  (ARMOUR) 90 MG tablet Take 90 mg by mouth daily.     Treprostinil ([name]) 0.6 MG/ML SOLN Inhale into the lungs 4 (four) times daily. Inhale 3 breaths per treatment session four times daily. Increase weekly to goal dose 9 to 12 breaths per treatment session four times daily     triamcinolone  cream (KENALOG ) 0.1 % Apply 1 application topically daily as needed (sun burn itch).     Turmeric 400 MG CAPS Take 1 capsule by mouth daily.     amLODipine  (NORVASC ) 5 MG tablet Take 1 tablet (5 mg total) by mouth daily. (Patient not taking: Reported on 07/14/2024) 90 tablet 3   dextromethorphan (DELSYM) 30 MG/5ML liquid Take 30 mg by mouth as needed for cough. (Patient not taking: Reported on 07/14/2024)     HYDROcodone  bit-homatropine (HYCODAN) 5-1.5 MG/5ML syrup Take 5 mLs by mouth every 4 (four) hours as needed for cough. 900 mL 0   liothyronine (CYTOMEL) 5 MCG tablet Take 5 mcg by mouth daily. (Patient not taking: Reported on 07/14/2024)     sildenafil (REVATIO) 20 MG tablet Take 20 mg by mouth daily as needed (ED). (Patient not taking: Reported on 07/14/2024)     No current facility-administered medications for this visit.    REVIEW OF SYSTEMS:   All other systems were reviewed with the patient and are negative.  PHYSICAL EXAMINATION: ECOG PERFORMANCE STATUS: 1 - Symptomatic but completely ambulatory  GENERAL: well appearing middle-aged Caucasian male in NAD  SKIN: skin color, texture, turgor are normal, no rashes or significant lesions EYES: conjunctiva are pink and non-injected, sclera  clear LUNGS: clear to auscultation and percussion with normal breathing effort HEART: regular rate & rhythm and no murmurs and no lower extremity edema Musculoskeletal: no cyanosis of digits and no clubbing  PSYCH: alert & oriented x 3, fluent speech NEURO: no focal motor/sensory deficits   LABORATORY DATA:  I have reviewed the data as listed    Latest Ref Rng & Units 07/14/2024    8:22 AM 06/16/2024    8:43 AM 05/19/2024    9:04 AM  CBC  WBC 4.0 - 10.5 K/uL 7.7  7.7  7.4   Hemoglobin 13.0 - 17.0 g/dL 85.8  86.5  86.5   Hematocrit 39.0 - 52.0 % 42.2  39.4  39.3   Platelets 150 - 400 K/uL 184  242  240        Latest Ref Rng & Units 07/14/2024    8:22 AM 06/16/2024    8:43 AM 05/19/2024    9:04 AM  CMP  Glucose 70 - 99 mg/dL 887  94  883   BUN 8 - 23 mg/dL 22  16  23    Creatinine 0.61 - 1.24 mg/dL 9.18  9.23  9.28   Sodium 135 - 145 mmol/L 135  132  134   Potassium 3.5 - 5.1 mmol/L 3.9  4.3  3.8   Chloride 98 - 111 mmol/L 96  94  98   CO2 22 - 32 mmol/L 36  35  31   Calcium  8.9 - 10.3 mg/dL 9.5  9.2  9.3   Total Protein 6.5 - 8.1  g/dL 8.2  7.8  7.9   Total Bilirubin 0.0 - 1.2 mg/dL 0.4  0.4  0.3   Alkaline Phos 38 - 126 U/L 46  47  50   AST 15 - 41 U/L 25  25  25    ALT 0 - 44 U/L 20  17  20      Lab Results  Component Value Date   MPROTEIN 0.9 (H) 06/16/2024   MPROTEIN 0.6 (H) 05/19/2024   MPROTEIN 0.7 (H) 04/21/2024   Lab Results  Component Value Date   KPAFRELGTCHN 41.1 (H) 06/16/2024   KPAFRELGTCHN 41.7 (H) 04/21/2024   KPAFRELGTCHN 43.4 (H) 03/24/2024   LAMBDASER 20.7 06/16/2024   LAMBDASER 18.6 04/21/2024   LAMBDASER 18.5 03/24/2024   KAPLAMBRATIO 1.99 (H) 06/16/2024   KAPLAMBRATIO 2.24 (H) 04/21/2024   KAPLAMBRATIO 2.35 (H) 03/24/2024    RADIOGRAPHIC STUDIES: I have personally reviewed the radiological images as listed and agreed with the findings in the report: lytic lesions in the hip bones bilaterally.  No results found.    ASSESSMENT &  PLAN Samuel [name] is a 68 y.o. male with medical history significant for IgG Kappa multiple myeloma who presents for a follow up visit.   After review of the labs, review of the records, and discussion with the patient the findings are most consistent with an IgG kappa multiple myeloma.  The patient has 2 lytic lesions within the pelvic bones (more noted in spine on CT scan) as well as 10% plasma cells within the bone marrow biopsy.  This combined with his serological findings confirm the diagnosis of multiple myeloma.  The initial treatment of choice for this patient's multiple myeloma was VRd. This will consist of bortezomib  1.5mg /m2 on days 1, 8, 15, dexamethasone  40mg  on days 1,8, and 15, and revlimid  25mg  PO daily days 1-14. Cycles will consist of 21 days. We will use this regimen to stabilize the patient's myeloma, then make a referral to a BMT center of his choosing for consideration of a bone marrow transplant once he has been noted to have a good response (VGPR or better). Zometa  was started after dental clearance was obtained. This will be continued x 2 years.   Unfortunately had a drug reaction to [name]  and was admitted to the hospital from 06/15/2021 until 06/18/2021.  The patient underwent a B AL which clearly showed evidence of eosinophils.  This is consistent with a drug reaction most likely caused by the patient's [name] .  Discussed the case with pulmonology who recommended that we not rechallenge for attempt other drugs in the same class such as pomalidomide.  Given the patient's excellent response to [name] -based therapy might preference would be to continue that.  As such I would recommend we proceed with CyBorD chemotherapy.  Daratumumab-based therapy could have been considered, but is often paired with an immunologic.  Additionally I would like to preserve those for additional lines of therapy if necessary.  Therefore we will proceed with CyBorD chemotherapy have the patient  referred to transplant.  R-IPSS score: Stage 2. PFS of 42 months  # IgG Kappa Multiple Myeloma (t11;14, standard risk)  --diagnostic criteria was met with monoclonal plasma cells in the bone marrow and lytic lesions on the bone --recommend proceeding with VRd chemotherapy as noted above --patient is young and healthy enough for consideration of BMT, though he is borderline with his age. Will consider referral pending response to treatment.  -- Cycle 1 Day 1 started on 03/16/2021 --decreased dexamethasone  to 20mg  PO  weekly due to fluid overload. Also started lasix  20mg  PO (changed on 06/01/2021) --plan to HOLD revlimid  moving forward after evidence of drug reaction in the lungs. --started CyBorD chemotherapy on 06/29/2021 but held starting 07/05/2021 given worsening lung function --patient has reached a VGPR.  Plan: --Labs from today were reviewed with patient. CBC is unremarkable without any cytopenias. Creatinine and calcium  levels are normal. Myeloma/ panel pending today --Last myeloma panel from 06/16/2024 showed increased M protein at 0.9 g/dL. With stable kappa light chains and ratio.  --Due to worsening lung function, initiating myeloma treatment is not recommended.  --Discussed the risk of waiting to resume treatment including end organ damage including cytopenias, renal dysfunction and pathologic fractures.  --RTC in 12 weeks with interval 4 week with labs   # Drug Reaction in the Lung --confirmed with increased eosinophils on BAL during admission for shortness of breath --currently on low dose prednisone  10 mg/day.    # Supportive Care -- chemotherapy education complete --zometa  therapy started on 04/05/2021 (4 mg IV q 3 months), treatment on hold per patient's request. -- zofran  8mg  q8H PRN and compazine  10mg  PO q6H for nausea -- acyclovir  400mg  PO BID for VCZ prophylaxis -- no pain medication required at this time.   No orders of the defined types were placed in this  encounter.  All questions were answered. The patient knows to call the clinic with any problems, questions or concerns.  I have spent a total of 30 minutes minutes of face-to-face and non-face-to-face time, preparing to see the patient,performing a medically appropriate examination, counseling and educating the patient,  documenting clinical information in the electronic health record,  and care coordination.   Johnston Police PA-C Dept of Hematology and Oncology [facility] Phone: [phone]   07/14/2024 9:22 AM

## 2024-07-15 ENCOUNTER — Other Ambulatory Visit (INDEPENDENT_AMBULATORY_CARE_PROVIDER_SITE_OTHER): Payer: Self-pay | Admitting: Otolaryngology

## 2024-07-15 ENCOUNTER — Telehealth: Payer: Self-pay | Admitting: Internal Medicine

## 2024-07-15 ENCOUNTER — Telehealth: Payer: Self-pay | Admitting: *Deleted

## 2024-07-15 DIAGNOSIS — J329 Chronic sinusitis, unspecified: Secondary | ICD-10-CM

## 2024-07-15 LAB — KAPPA/LAMBDA LIGHT CHAINS
Kappa free light chain: 42.2 mg/L — ABNORMAL HIGH (ref 3.3–19.4)
Kappa, lambda light chain ratio: 2.09 — ABNORMAL HIGH (ref 0.26–1.65)
Lambda free light chains: 20.2 mg/L (ref 5.7–26.3)

## 2024-07-15 NOTE — Telephone Encounter (Signed)
 Referral has been fax'd to [facility] clinic for pt's worsening ILD to fax # [phone]. Received fac receipt confirmation.

## 2024-07-15 NOTE — Telephone Encounter (Signed)
 Copied from CRM #[id number]. Topic: Clinical - Medical Advice >> Jul 13, 2024 11:18 AM Russell PARAS wrote: Reason for CRM:   Karna Scudder, a nurse working with patient for several months, is reaching out to speak with provider or nurse about her concerns.   Having worsening symptoms with use of inhaled therapies. Feels he may need alternative treatment. Currently on [name].   CB#  [phone]

## 2024-07-16 NOTE — Telephone Encounter (Signed)
 ATC x1. LVMTCB

## 2024-07-17 LAB — MULTIPLE MYELOMA PANEL, SERUM
Albumin SerPl Elph-Mcnc: 3.2 g/dL (ref 2.9–4.4)
Albumin/Glob SerPl: 0.8 (ref 0.7–1.7)
Alpha 1: 0.3 g/dL (ref 0.0–0.4)
Alpha2 Glob SerPl Elph-Mcnc: 0.9 g/dL (ref 0.4–1.0)
B-Globulin SerPl Elph-Mcnc: 1.1 g/dL (ref 0.7–1.3)
Gamma Glob SerPl Elph-Mcnc: 1.8 g/dL (ref 0.4–1.8)
Globulin, Total: 4.1 g/dL — ABNORMAL HIGH (ref 2.2–3.9)
IgA: 394 mg/dL (ref 61–437)
IgG (Immunoglobin G), Serum: 2161 mg/dL — ABNORMAL HIGH (ref 603–1613)
IgM (Immunoglobulin M), Srm: 120 mg/dL (ref 20–172)
M Protein SerPl Elph-Mcnc: 0.7 g/dL — ABNORMAL HIGH
Total Protein ELP: 7.3 g/dL (ref 6.0–8.5)

## 2024-07-19 ENCOUNTER — Ambulatory Visit (HOSPITAL_COMMUNITY): Payer: Self-pay | Admitting: Physician Assistant

## 2024-07-20 ENCOUNTER — Other Ambulatory Visit: Payer: Self-pay | Admitting: Physician Assistant

## 2024-07-20 ENCOUNTER — Other Ambulatory Visit: Payer: Self-pay

## 2024-07-20 DIAGNOSIS — J984 Other disorders of lung: Secondary | ICD-10-CM

## 2024-07-21 ENCOUNTER — Inpatient Hospital Stay: Attending: Hematology and Oncology | Admitting: Nurse Practitioner

## 2024-07-21 ENCOUNTER — Encounter: Payer: Self-pay | Admitting: Nurse Practitioner

## 2024-07-21 DIAGNOSIS — G893 Neoplasm related pain (acute) (chronic): Secondary | ICD-10-CM | POA: Diagnosis not present

## 2024-07-21 DIAGNOSIS — C9000 Multiple myeloma not having achieved remission: Secondary | ICD-10-CM

## 2024-07-21 DIAGNOSIS — R0602 Shortness of breath: Secondary | ICD-10-CM

## 2024-07-21 DIAGNOSIS — R530 Neoplastic (malignant) related fatigue: Secondary | ICD-10-CM

## 2024-07-21 DIAGNOSIS — Z515 Encounter for palliative care: Secondary | ICD-10-CM

## 2024-07-21 DIAGNOSIS — R630 Anorexia: Secondary | ICD-10-CM

## 2024-07-21 DIAGNOSIS — K5903 Drug induced constipation: Secondary | ICD-10-CM

## 2024-07-21 NOTE — Patient Instructions (Signed)
 VISIT SUMMARY:  During your visit, we discussed your ongoing issues with shortness of breath, nasal congestion, decreased mobility, poor appetite, and constipation. We also reviewed your current medications and made plans for further management.  YOUR PLAN:  -CHRONIC RESPIRATORY INSUFFICIENCY: Chronic respiratory insufficiency means your lungs are not able to get enough oxygen into your blood. Your symptoms are currently well-managed, so no changes are needed at this time.  -DECREASED MOBILITY WITH LOWER EXTREMITY WEAKNESS AND BACK PAIN: Your decreased mobility is due to weakness in your legs and back pain. We will send you a link for potential wheelchair options and check your insurance coverage for a more suitable wheelchair and a bedside table. We will also order a bedside table for you.  -POOR APPETITE AND UNINTENTIONAL WEIGHT LOSS: You have a poor appetite, especially in the afternoon and evening, and prefer easily chewable foods. To help with this, consider using protein shakes on days when your appetite is low.  -CONSTIPATION: Constipation means you have difficulty with bowel movements. You are using marijuana daily to help with this and need assistance with toileting due to mobility issues.  -CHRONIC NASAL CONGESTION AND SUSPECTED CHRONIC SINUSITIS: Chronic nasal congestion and suspected chronic sinusitis mean you have ongoing nasal blockage and inflammation of the sinuses. A CT scan has been ordered to check for continuous blockage per Dr. Soldavato. Please call central scheduling to arrange your CT scan at [phone].  -MEDICATION MANAGEMENT: You are continuing on prednisone  5 mg daily and need a refill. Please check with your pharmacy to ensure your prescription is refilled.  INSTRUCTIONS:  Please call central scheduling to arrange your CT scan.   We will send you a link for potential wheelchair options and check your insurance coverage for a more suitable wheelchair and a bedside  table. Additionally, we will order a bedside table for you. These items have been ordered. If you do not receive updates by Monday please contact office. I will wait to see if insurance approves both items prior to sending private pay link.   Consider using protein shakes on days when your appetite is low.

## 2024-07-21 NOTE — Progress Notes (Signed)
 Palliative Medicine [facility]  [phone] [phone]   Name: Samuel [name] Date: 07/21/2024 MRN: [id number]  DOB: 10-25-1955  Patient Care Team: Seabron Lenis, MD as PCP - General (Family Medicine) [place]mira Newman JINNY, MD as PCP - Cardiology (Cardiology)   I connected with Samuel [name] on 07/21/24 at  9:30 AM EDT by MyChart video and verified that I am speaking with the correct person using two identifiers.   I discussed the limitations, risks, security and privacy concerns of performing an evaluation and management service by telemedicine and the availability of in-person appointments. I also discussed with the patient that there may be a patient responsible charge related to this service. The patient expressed understanding and agreed to proceed.   Other persons participating in the visit and their role in the encounter: Wife  Patient's location: Home   Provider's location: [facility]   INTERVAL HISTORY: Samuel [name] is a 68 y.o. male with with oncologic medical history including multiple myeloma with multiple lytic lesions to thoracolumbar spine and bilateral pelvis, developed pulmonary fibrosis.  Palliative is seeing patient for symptom management and goals of care.   SOCIAL HISTORY:     reports that he quit smoking about 41 years ago. His smoking use included cigarettes. He started smoking about 44 years ago. He has a 0.3 pack-year smoking history. He has never used smokeless tobacco. He reports that he does not currently use alcohol. He reports that he does not use drugs.  ADVANCE DIRECTIVES:  Active, not on file. Patient to bring in.   CODE STATUS: DNR  PAST MEDICAL HISTORY: Past Medical History:  Diagnosis Date   Asthma    GERD (gastroesophageal reflux disease)    High cholesterol    under control.    History of blood transfusion    Hypothyroidism    Interstitial lung disease (HCC)    Multiple myeloma (HCC)    Perennial  allergic rhinitis    Pneumonia    Sciatic pain, right    Seasonal allergic rhinitis    Thyroid  disease     ALLERGIES:  is allergic to amlodipine , beclomethasone, ciclesonide, clarithromycin, ofev  [nintedanib], penicillins, rosuvastatin , trazodone, and yutrepia [treprostinil sodium].  MEDICATIONS:  Current Outpatient Medications  Medication Sig Dispense Refill   acyclovir  (ZOVIRAX ) 400 MG tablet TAKE ONE TABLET BY MOUTH TWICE DAILY 60 tablet 2   albuterol  (PROVENTIL ) (2.5 MG/3ML) 0.083% nebulizer solution USE ONE VIAL IN NEBULIZER EVERY 6 HOURS AS NEEDED WHEEZING OR SHORTNESS OF BREATH 120 mL 12   albuterol  (VENTOLIN  HFA) 108 (90 Base) MCG/ACT inhaler Inhale 2 puffs into the lungs every 6 hours as needed for wheezing or shortness of breath. 8.5 g 1   ALPRAZolam  (XANAX ) 0.5 MG tablet Take 0.5 mg by mouth at bedtime.     ASSESS FULL RANGE PEAK METER DEVI as directed.     azelastine  (ASTELIN ) 0.1 % nasal spray Place 2 sprays into both nostrils 2 (two) times daily. Use in each nostril as directed 30 mL 12   bisoprolol  (ZEBETA ) 5 MG tablet Take 1 tablet (5 mg total) by mouth daily. 30 tablet 2   Budeson-Glycopyrrol-Formoterol (BREZTRI  AEROSPHERE) 160-9-4.8 MCG/ACT AERO Inhale 2 puffs into the lungs in the morning and at bedtime. 10.7 g 11   cetirizine  (ZYRTEC ) 10 MG tablet Take 1 tablet (10 mg total) by mouth daily. 30 tablet 11   cholecalciferol (VITAMIN D) 1000 UNITS tablet Take 3,000 Units by mouth daily.  dextromethorphan (DELSYM) 30 MG/5ML liquid Take 30 mg by mouth as needed for cough. (Patient not taking: Reported on 07/14/2024)     EPINEPHrine  (EPI-PEN) 0.3 mg/0.3 mL DEVI Inject 0.3 mg into the muscle as needed.     ezetimibe -simvastatin  (VYTORIN ) 10-40 MG tablet Take 1 tablet by mouth daily. 90 tablet 3   fexofenadine (ALLEGRA) 180 MG tablet Take 180 mg by mouth daily.     fluticasone  (FLONASE) 50 MCG/ACT nasal spray Place 1 spray into both nostrils daily as needed for allergies or  rhinitis.     furosemide  (LASIX ) 40 MG tablet Take 1 tablet (40 mg total) by mouth daily as needed. 30 tablet 0   HYDROcodone  bit-homatropine (HYCODAN) 5-1.5 MG/5ML syrup Take 5 mLs by mouth every 4 (four) hours as needed for cough. 900 mL 0   ibuprofen (ADVIL) 200 MG tablet Take 200 mg by mouth every 6 (six) hours as needed for moderate pain (pain score 4-6).     ketoconazole (NIZORAL) 2 % cream Apply 1 Application topically 2 (two) times daily as needed for irritation.     levothyroxine  (SYNTHROID ) 50 MCG tablet Take 50 mcg by mouth every morning.     montelukast  (SINGULAIR ) 10 MG tablet Take 10 mg by mouth at bedtime.     Multiple Vitamin (MULTIVITAMIN) tablet Take 1 tablet by mouth daily.     omeprazole  (PRILOSEC) 40 MG capsule Take 1 capsule (40 mg total) by mouth 2 (two) times daily. Take 30 minutes before a meal 60 capsule 3   OXYGEN Inhale 2 L into the lungs continuous.     potassium chloride  SA (KLOR-CON  M20) 20 MEQ tablet Take 1 tablet (20 mEq total) by mouth daily. 30 tablet 0   predniSONE  (DELTASONE ) 10 MG tablet Take 1 tablet (10 mg total) by mouth daily with breakfast. (Patient taking differently: Take 5 mg by mouth daily with breakfast.) 30 tablet 6   sildenafil (REVATIO) 20 MG tablet Take 20 mg by mouth daily as needed (ED). (Patient not taking: Reported on 07/14/2024)     Simethicone  (SIMETHICONE  ULTRA STRENGTH) 180 MG CAPS Take 1 capsule (180 mg total) by mouth 3 (three) times daily as needed. 90 capsule 0   sodium chloride  (OCEAN) 0.65 % SOLN nasal spray Place 1 spray into both nostrils as needed for congestion.     tamsulosin  (FLOMAX ) 0.4 MG CAPS capsule Take 0.4 mg by mouth daily.     thyroid  (ARMOUR) 90 MG tablet Take 90 mg by mouth daily.     Treprostinil ([name]) 0.6 MG/ML SOLN Inhale into the lungs 4 (four) times daily. Inhale 3 breaths per treatment session four times daily. Increase weekly to goal dose 9 to 12 breaths per treatment session four times daily      triamcinolone  cream (KENALOG ) 0.1 % Apply 1 application topically daily as needed (sun burn itch).     Turmeric 400 MG CAPS Take 1 capsule by mouth daily.     No current facility-administered medications for this visit.    VITAL SIGNS: There were no vitals taken for this visit. There were no vitals filed for this visit.  Estimated body mass index is 26.52 kg/m as calculated from the following:   Height as of 06/29/24: 5' 10 (1.778 m).   Weight as of 07/14/24: 184 lb 12.8 oz (83.8 kg).  PERFORMANCE STATUS (ECOG) : 2 - Symptomatic, <50% confined to bed  Physical Exam General: NAD Neurological: AAO x3  IMPRESSION: Discussed the use of AI scribe software for  clinical note transcription with the patient, who gave verbal consent to proceed.  History of Present Illness Samuel [name] is a 68 year old male with history of multiple myeloma and pulmonary fibrosis who I connected with via MyChart video for follow-up. No acute distress noted. He is doing as well as expected overall. Denies concerns for nausea, vomiting, constipation, or diarrhea. Occasional fatigue. His appetite is fair some days better than others. He is able to work some hours in the office and other times in the home.   He reports experiencing significant shortness of breath and nasal congestion, which makes breathing difficult. He uses a humidifier, saline nasal spray, and Flonase to manage these symptoms. Recently, he underwent acupuncture, which provided temporary relief. A CT scan of his nasal passages has been ordered, but it has not yet been scheduled per Dr. Okey. Patient provided number to central scheduling.   His appetite is variable; he eats well in the morning and early afternoon but loses interest in food after 4 PM. His caregiver notes that he often returns home with his lunch uneaten. He prefers foods that are easy to chew, like cut-up chicken, and is not currently using protein shakes. He estimates  consuming about six ounces of protein per meal but feels he lacks sufficient green vegetables in his diet. We discussed use of protein drinks. Patient encouraged to focus and pace himself on small frequent meals as he shares the level of energy and exertion to eat plays a role in his lack of appetite.   He is on Lasix , taking 20 mg as needed, but did not take it today to monitor for fluid buildup. He also uses Hycodan cough syrup as needed, though he hasn't used it in the past two days. He takes 5 mg of prednisone  daily per pulmonology team.   Mr. Ambrosini faces mobility challenges due to weakness in his legs and back pain, exacerbated by the lack of a suitable wheelchair. He currently uses a transport chair given by a friend and another heavier chair at home, neither of which are ideal. He desires a more comfortable, adjustable wheelchair. We will see if we can push thru and order with insurance for both an adjustable wheelchair in addition to bedside table for support in the home. He verbalized understanding and appreciation.   We will continue to closely monitor and support.   Assessment & Plan Chronic respiratory disease requiring home oxygen and nebulizer Chronic respiratory disease managed with home oxygen and nebulizer therapy. Current issues include the need for a lighter adjustable wheelchair. Received ordered oxygen humidifier.   Chronic respiratory insufficiency Chronic respiratory insufficiency with persistent dyspnea. Symptoms are well-managed. -Continue use of hycodan for cough  Decreased mobility with lower extremity weakness and back pain Decreased mobility due to lower extremity weakness and back pain. Current wheelchair is suboptimal for comfort and mobility. Insurance coverage for a more suitable wheelchair is uncertain. - Send link for potential wheelchair options - Check insurance coverage for wheelchair and bedside table - Order bedside table through the system  Poor appetite  and unintentional weight loss Poor appetite, particularly in the afternoon and evening, with a preference for easily chewable foods. Current protein intake is approximately six ounces per meal, with a desire to increase protein and green vegetable intake. Energy levels and dyspnea may contribute to reduced appetite. - Consider protein shakes as a supplement on days with reduced appetite  Constipation Constipation. Miralax is used daily to assist with bowel movements.  Assistance is required for toileting due to mobility issues.  Chronic nasal congestion and suspected chronic sinusitis Persistent nasal congestion and suspected chronic sinusitis. Previous messaging with primary care has not resolved the issue. Acupuncture provided temporary relief. A CT scan of the nasal passages has been ordered to assess for continuous blockage, but scheduling has not been completed. - Provide central scheduling number for CT scan per Dr. Okey. - Advise to call central scheduling to arrange CT scan  Medication management Continues on prednisone  5 mg daily. Requires refill for ongoing management. - Check with pharmacy for prednisone  refill  I will plan to see patient back in 3-4 weeks. Sooner if needed.   Patient expressed understanding and was in agreement with this plan. He also understands that He can call the clinic at any time with any questions, concerns, or complaints.   Any controlled substances utilized were prescribed in the context of palliative care. PDMP has been reviewed.   I provided 45 minutes of face-to-face video visit time during this encounter, and > 50% was spent counseling as documented under my assessment & plan. Visit consisted of counseling and education dealing with the complex and emotionally intense issues of symptom management and palliative care in the setting of serious and potentially life-threatening illness.  Levon Borer, AGPCNP-BC  Palliative Medicine  Team/ Cancer Center

## 2024-07-22 ENCOUNTER — Telehealth: Payer: Self-pay | Admitting: Nurse Practitioner

## 2024-07-22 NOTE — Telephone Encounter (Signed)
 Scheduled and rescheduled appointments per 10/1 los. Talked with the patient and he is aware of the made appointments.

## 2024-07-25 ENCOUNTER — Other Ambulatory Visit: Payer: Self-pay | Admitting: Cardiology

## 2024-07-28 ENCOUNTER — Ambulatory Visit (HOSPITAL_COMMUNITY)
Admission: RE | Admit: 2024-07-28 | Discharge: 2024-07-28 | Disposition: A | Discharge status: Home / Self Care | Source: Ambulatory Visit | Attending: Otolaryngology | Admitting: Otolaryngology

## 2024-07-28 DIAGNOSIS — J329 Chronic sinusitis, unspecified: Secondary | ICD-10-CM | POA: Insufficient documentation

## 2024-07-29 ENCOUNTER — Ambulatory Visit (INDEPENDENT_AMBULATORY_CARE_PROVIDER_SITE_OTHER): Admitting: *Deleted

## 2024-07-29 DIAGNOSIS — J849 Interstitial pulmonary disease, unspecified: Secondary | ICD-10-CM

## 2024-07-29 DIAGNOSIS — I288 Other diseases of pulmonary vessels: Secondary | ICD-10-CM

## 2024-07-29 DIAGNOSIS — J9611 Chronic respiratory failure with hypoxia: Secondary | ICD-10-CM

## 2024-07-29 DIAGNOSIS — R053 Chronic cough: Secondary | ICD-10-CM

## 2024-07-29 DIAGNOSIS — T50905A Adverse effect of unspecified drugs, medicaments and biological substances, initial encounter: Secondary | ICD-10-CM

## 2024-07-29 DIAGNOSIS — R0609 Other forms of dyspnea: Secondary | ICD-10-CM

## 2024-07-29 LAB — PULMONARY FUNCTION TEST
FEF 25-75 Pre: 0.68 L/s
FEF2575-%Pred-Pre: 26 %
FEV1-%Pred-Pre: 21 %
FEV1-Pre: 0.73 L
FEV1FVC-%Pred-Pre: 108 %
FEV6-%Pred-Pre: 21 %
FEV6-Pre: 0.91 L
FEV6FVC-%Pred-Pre: 105 %
FVC-%Pred-Pre: 20 %
FVC-Pre: 0.91 L
Pre FEV1/FVC ratio: 80 %
Pre FEV6/FVC Ratio: 100 %

## 2024-07-29 NOTE — Progress Notes (Signed)
 Spirometry only performed today. Patient wore 6 L 02.

## 2024-07-30 ENCOUNTER — Ambulatory Visit (INDEPENDENT_AMBULATORY_CARE_PROVIDER_SITE_OTHER): Admitting: Internal Medicine

## 2024-07-30 ENCOUNTER — Telehealth (INDEPENDENT_AMBULATORY_CARE_PROVIDER_SITE_OTHER): Payer: Self-pay | Admitting: Otolaryngology

## 2024-07-30 ENCOUNTER — Encounter: Payer: Self-pay | Admitting: Internal Medicine

## 2024-07-30 ENCOUNTER — Telehealth: Payer: Self-pay | Admitting: Internal Medicine

## 2024-07-30 VITALS — BP 126/78 | HR 62 | Temp 97.8°F | Ht 70.0 in

## 2024-07-30 DIAGNOSIS — Z7189 Other specified counseling: Secondary | ICD-10-CM

## 2024-07-30 DIAGNOSIS — C9000 Multiple myeloma not having achieved remission: Secondary | ICD-10-CM | POA: Diagnosis not present

## 2024-07-30 DIAGNOSIS — J9611 Chronic respiratory failure with hypoxia: Secondary | ICD-10-CM | POA: Diagnosis not present

## 2024-07-30 DIAGNOSIS — R627 Adult failure to thrive: Secondary | ICD-10-CM

## 2024-07-30 DIAGNOSIS — Z23 Encounter for immunization: Secondary | ICD-10-CM

## 2024-07-30 DIAGNOSIS — R634 Abnormal weight loss: Secondary | ICD-10-CM

## 2024-07-30 DIAGNOSIS — I2723 Pulmonary hypertension due to lung diseases and hypoxia: Secondary | ICD-10-CM

## 2024-07-30 DIAGNOSIS — F419 Anxiety disorder, unspecified: Secondary | ICD-10-CM

## 2024-07-30 DIAGNOSIS — J849 Interstitial pulmonary disease, unspecified: Secondary | ICD-10-CM | POA: Diagnosis not present

## 2024-07-30 DIAGNOSIS — R0602 Shortness of breath: Secondary | ICD-10-CM

## 2024-07-30 MED ORDER — AZITHROMYCIN 250 MG PO TABS: ORAL_TABLET | ORAL | 6 refills | Status: DC

## 2024-07-30 NOTE — Telephone Encounter (Signed)
 Spoke with patient about CT results, no sinusitis or narrowing of nasal passages. Advised to continue current management

## 2024-07-30 NOTE — Progress Notes (Signed)
 OV 05/12/2024  Subjective:  Patient ID: Samuel [name], male , DOB: 08-17-1956 , age 68 y.o. , MRN: [id number] , ADDRESS: [address] [place] [address] PCP Seabron Lenis, MD Patient Care Team: Seabron Lenis, MD as PCP - General (Family Medicine) Elmira Newman JINNY, MD as PCP - Cardiology (Cardiology)  This Provider for this visit: Treatment Team:  Attending Provider: Geronimo Amel, MD  05/12/2024 -   Chief Complaint  Patient presents with   Medical Management of Chronic Issues    ILD- increased SOB, cough since last visit. He has had asthma like symptoms- bronchospasms. Stopped Yutrepia 4 days ago.      HPI Samuel [name] 68 y.o. -returns for follow-up.  He was on inhaled treprostinil DPI but did not tolerated.  We then put him on the new 3 technology version of treprostinil call YUTREPIA but he developed bronchospasm.  He is not able to tolerate it.  He is now interested in the treprostinil nebulizer.  But I did indicate to him and his wife that very likely will have side effects but it is definitely worth trying but pending insurance approval.  He is interested in clinical trials but his myeloma is an exclusion.  He is now significantly more hypoxemic he is requiring 4 L at rest.  There is a easily desaturates on room air at rest.  He and his wife had several questions - He says the main thing is for him to control his air hunger or calm his breathing.  He says anxiety makes it worse meditation and acupuncture makes it better anxiolytic such as Xanax  also makes it better.  We discussed about morphine as a palliative relief for dyspnea.  He of note he is already on Hycodan for cough.  He also brought up the idea of chronic daily prednisone  which apparently is a last resort and myeloma.  I said I was open to this idea particularly to quell inflammation and also to see if it will help with his dyspnea and cough.  Therefore we decided to prescribe this.  Nerandomilast  might be approved in 6 months but he has significant fibrosis at this point in time.  I did discuss about this medication as well   - Chronic cough did refill on Hycodan  -Goals of care and life expectancy: He wants to continue to work he continues to work actually.  He spends a few hours in the office.  Despite all his health issues.  Did indicate that prognosis is guarded and life expectancy could be up to 1 year.  He struggling between myeloma and also fibrosis which is progressive.       OV 07/30/2024  Subjective:  Patient ID: Samuel [name], male , DOB: 1956/04/10 , age 14 y.o. , MRN: [id number] , ADDRESS: [address] [place] [address] PCP Seabron Lenis, MD Patient Care Team: Seabron Lenis, MD as PCP - General (Family Medicine) Elmira Newman JINNY, MD as PCP - Cardiology (Cardiology) Pickenpack-Cousar, [name] SAILOR, NP as Nurse Practitioner [facility] and Palliative Medicine)  This Provider for this visit: Treatment Team:  Attending Provider: Geronimo Amel, MD     Follow up : ILD , drug-induced pneumonitis, chronic respiratory failure, asthma, progressive phenotype Esbriet  stopped in October 2023 due to inability to tolerate due to GI side effects   Ofev  spped in 2022 -intolerant  Esbriet  stopped in October 2023 due to inability to tolerate due to GI side effects   # IgG Kappa  Multiple Myeloma 02/22/2021: bone marrow biopsy confirms the diagnosis of Multiple Myeloma with a monoclonal plasma cell population.   # History of asthma.  Mid February 2020 for mildly elevated IgE and dust mite allergy .  On RAST allergy  panel.  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 -presents with his wife [name].  In the interim we referred him to [facility] for lung transplant evaluation but they declined him because of ongoing multiple myeloma.  He was very saddened by this and emailed me about this.  In addition then he gave himself a repeat challenge with the treprostinil nebulizer but immediately developed side effects on day 1.  He said that his chest felt extremely tight he became short of breath.  He got frustrated by this.   he told me that it took him several days to recover from this.  He tells me he and his wife acknowledges that he is declining he is requiring 68 L of oxygen at rest.  With exertion it is 68 L.  He says he has lost weight lost muscle mass.  His appetite is diminished.  There is failure to thrive.  His anxiety levels are worse.  He has class IV dyspnea.  He is on Hycodan.  We discussed goals of care - Dr. Federico oncology referred him to [facility] pulmonary clinic.  But he feels he is too frail to go there and we took a shared decision making to hold off on the visit  - Symptoms: He is having depression, anxiety class IV dyspnea: I have sent a message to his outpatient palliative care team to address all this and consider even oral morphine for dyspnea relief and control of anxiety or other anxiety medications.   - ILD: He says that he got worse much in 2025.  I agreed with him.  He feels that might be some virus or bacteria colonized in the lung.  Based on this we decided to empiric azithromycin  3 times a week along with his chronic prednisone  to see if this would make a difference.  So far the prednisone  has not made a difference.  We  discussed Nerandomilast upcoming.  I will send a message to the pharmacy team. He deats at toilet. PFTs worse   - CODE STATUS and other physical supportive care: I advised long-term care insurance but he feels he can get someone to pay out-of-pocket and support him and his wife.  His wife feels he is not ready for that and she is able to manage.  I helped him with the MOST form.  Advise life expectancy is in the order of several months and while I expected him to survive the holidays 2025 that I would be surprised if he was alive in spring 2026 or summer 2026 but again there is a lot of variability with prognosis particular if the disease decides to  stabilize.   He will have a flu shot today.  SYMPTOM SCALE - ILD 08/09/2021 09/25/2021 218# 12/04/2021 222# 01/24/22 222# 04/09/2022 215# 06/06/2022 211# 06/12/2023 205# 09/12/2023  05/12/2024 Using 4L rest, 10L exertion. RA is 84% Rest 07/30/2024 6L rest pulse ox 75% is on room air  Current weight    Treadmill 2.2 mph.  As 1.4 miles over 40 minutes.  Uses 8 L oxygen Post may 2nd visit 03/27/22 -2ndcvoid       O2 use ra ra ra ra 2L eert 2L exert O2 with exertion   Uses oxygen with exertion  Shortness of Breath 0 -> 5 scale with 5 being worst (score 6 If unable to do)           At rest 0 0 0 0 0 4 0.5 2  1   Simple tasks - showers, clothes change, eating, shaving 2 2 1  0/5 0 3 2 3   4.5  Household (dishes, doing bed, laundry) x na 1 1 1 3 3 4  5   Shopping 3 1 1.5 0/5 1 3  0.5 3  5   Walking level at own pace 4 2 2 1 1 2  2.5 4  5   Walking up Stairs 5 4 3  3.5 3 2 5 5  5   Total (30-36) Dyspnea Score 14 9 8.5 6 6 17  14.5 21  25.5  How bad is your cough? 3 1 0.5  3.4 5 Only iwht acid reflux 4  Occasional  How bad is your fatigue 0 2 0  3   3  Increasing  How bad is nausea 0 0 0  0 5 0 1  Reducing 50/50  How bad is vomiting?  0 0 0  0 0 0 0  0  How bad is diarrhea? 0 0 0  0 4 0 1  0  How bad is anxiety? 5 2 1   0.5 0 6 3  Getting worse  How bad is  depression 2 1 1   0.5 0 2 3  Getting worse  Any chronic pain - if so where and how bad x x x  x  x 0  x     Simple office walk 185 feet x  3 laps goal with forehead probe 07/12/2021  08/09/2021  09/25/2021  12/04/2021  01/24/2022  04/09/2022  06/06/2022  12/26/2022  06/12/2023 09/12/2023   O2 used ra Ra3 ra ra ra ra ra ra ra  Number laps completed 3 3 but did oly 2 3 3  attempted but stopped at 2 due to deats All 3 las Stopped at [employer] all 3 Stopped at 2 1 lap and even that in half away  Comments about pace slow slow slow slow Avg pace Avg  slow slow   Resting Pulse Ox/HR 100% and 69/min 100%ad 74 98% and 75 99% RA and59 98% and HR 60 98% and HR 54 98% nand HR 90 98% n HR 73 100% and HR 64  Final Pulse Ox/HR 92% and 81/min 93% and 87 91% and 91 86% RA and 85 91% and HR 80 (brief 89%) 88% and HR 80 91% and HR 107 90% an HR 79 92% and HR 67 - half lap  Desaturated </= 88% no no no yes ni  no    Desaturated <= 3% points Yes 8 Yes, 7 pponts Yes, 7 points Yes, 13 poins Yes 7 points Yes, 10 points Yes 7 poin    Got Tachycardic >/= 90/min no no yes no  no  uyes    Symptoms at end of test No complaints Mod-severe dyspnea Mild dyspnea No complaints none Seemed ok Mild dyspnea    Miscellaneous comments x Worse? stable ? worse     Reduced efforr tolerance     PFT     Latest Ref Rng & Units 07/29/2024    3:23 PM 06/12/2023   11:30 AM 01/17/2023    2:09 PM 09/26/2022   10:34 AM 08/01/2022    1:57 PM 06/06/2022    1:52 PM 12/10/2021    9:24 AM  PFT Results  FVC-Pre L 0.91  1.19  1.36  1.32  1.55  1.43  1.49   FVC-Predicted Pre % 20  26  29  28   33  30  32   Pre FEV1/FVC % % 80  88  90  85  94  88  94   FEV1-Pre L 0.73  1.05  1.23  1.12  1.46  1.26  1.40   FEV1-Predicted Pre % 21  30  36  32  42  36  40   DLCO uncorrected ml/min/mmHg  9.36  12.92  9.61  10.43  13.19  8.64   DLCO UNC% %  35  48  36  39  49  32   DLCO corrected ml/min/mmHg  9.36  13.27  9.96  10.58  13.77  9.20   DLCO COR  %Predicted %  35  49  37  39  51  34   DLVA Predicted %  126  127  107  110  128  97        LAB RESULTS last 96 hours CT MAXILLOFACIAL WO CONTRAST Result Date: 07/29/2024 CLINICAL DATA:  Provided history: Chronic sinusitis, unspecified location. EXAM: CT MAXILLOFACIAL WITHOUT CONTRAST TECHNIQUE: Multidetector CT images of the paranasal sinuses were obtained using the standard protocol without intravenous contrast. RADIATION DOSE REDUCTION: This exam was performed according to the departmental dose-optimization program which includes automated exposure control, adjustment of the mA and/or kV according to patient size and/or use of iterative reconstruction technique. COMPARISON:  Maxillofacial CT 03/31/2023. FINDINGS: Paranasal sinuses: Frontal: Normally aerated. Patent frontal sinus drainage pathways. Ethmoid: Normally aerated. Maxillary: Normally aerated. Sphenoid: Normally aerated. Patent sphenoethmoidal recesses. Right ostiomeatal unit: Patent. Left ostiomeatal unit: Patent. Nasal passages: No mass or polyp identified within the nasal passages. The nasal septum is essentially midline. Anatomy: Pneumatization is present superior to the anterior ethmoid notches bilaterally. Symmetric and intact olfactory grooves and fovea ethmoidalis, Keros II (4-87mm). Sellar sphenoid pneumatization pattern (however, the sphenoid sinuses only extends partly beneath the sella turcica). 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 Calcified atherosclerotic plaque about the carotid bifurcations. Incompletely assessed cervical spondylosis. Mild grade 1 anterolisthesis at C2-C3. Mild grade 1 retrolisthesis at C3-C4. IMPRESSION: 1. Normally aerated paranasal sinuses. 2. Patent sinus drainage pathways. 3. 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 4. Calcified atherosclerotic plaque about the carotid bifurcations. 5. Incompletely assessed cervical spondylosis.  Electronically Signed   By: Rockey Childs D.O.   On: 07/29/2024 21:01         has a past medical history of Asthma, GERD (gastroesophageal reflux disease), High cholesterol, History of blood transfusion, Hypothyroidism, Interstitial lung disease (HCC), Multiple myeloma (HCC), Perennial allergic rhinitis, Pneumonia, Sciatic pain, right, Seasonal allergic rhinitis, and Thyroid  disease.   reports that he quit smoking about 41 years ago. His smoking use included  cigarettes. He started smoking about 44 years ago. He has a 0.3 pack-year smoking history. He has never used smokeless tobacco.  Past Surgical History:  Procedure Laterality Date   BRONCHIAL BIOPSY  06/18/2021   Procedure: BRONCHIAL BIOPSIES;  Surgeon: Shelah Lamar RAMAN, MD;  Location: [facility];  Service: Cardiopulmonary;;   BRONCHIAL BIOPSY  08/28/2021   Procedure: BRONCHIAL BIOPSIES;  Surgeon: Brenna Adine CROME, DO;  Location: [facility];  Service: Cardiopulmonary;;   BRONCHIAL WASHINGS  06/18/2021   Procedure: BRONCHIAL WASHINGS;  Surgeon: Shelah Lamar RAMAN, MD;  Location: [facility];  Service: Cardiopulmonary;;   BRONCHIAL WASHINGS  08/28/2021   Procedure: BRONCHIAL WASHINGS;  Surgeon: Brenna Adine CROME, DO;  Location: [facility];  Service: Cardiopulmonary;;   NASAL SINUS SURGERY     NECK SURGERY  1996   RIGHT HEART CATH N/A 01/23/2024   Procedure: RIGHT HEART CATH;  Surgeon: Elmira Newman PARAS, MD;  Location: [facility];  Service: Cardiovascular;  Laterality: N/A;   VIDEO BRONCHOSCOPY N/A 06/18/2021   Procedure: VIDEO BRONCHOSCOPY WITH FLUORO;  Surgeon: Shelah Lamar RAMAN, MD;  Location: [facility];  Service: Cardiopulmonary;  Laterality: N/A;   VIDEO BRONCHOSCOPY N/A 08/28/2021   Procedure: VIDEO BRONCHOSCOPY WITH FLUORO;  Surgeon: Brenna Adine CROME, DO;  Location: [facility];  Service: Cardiopulmonary;  Laterality: N/A;    Allergies  Allergen Reactions   Amlodipine  Swelling   Beclomethasone Other (See Comments)     unknown   Ciclesonide Other (See Comments)    unknown   Clarithromycin Other (See Comments)    Hiccups   Ofev  [Nintedanib] Other (See Comments)    GI bleeding   Penicillins Other (See Comments)    Child hood unsure   Rosuvastatin      Other Reaction(s): hand swelling   Trazodone     Other Reaction(s): sweating   Olga [Treprostinil Sodium] Cough    Bronchial spasms, sore throat.    Immunization History  Administered Date(s) Administered   Fluad Quad(high Dose 65+) 08/09/2021, 08/01/2022, 07/22/2023   Fluzone Influenza virus vaccine,trivalent (IIV3), split virus 10/27/2017   INFLUENZA, HIGH DOSE SEASONAL PF 12/01/2017, 11/29/2019, 12/19/2020, 12/24/2021, 07/30/2024   Influenza, Quadrivalent, Recombinant, Inj, Pf 07/28/2019, 08/11/2020   Influenza-Unspecified 11/26/2011, 07/21/2017   PFIZER(Purple Top)SARS-COV-2 Vaccination 12/27/2019, 01/24/2020, 09/21/2020   Tdap 08/20/2005, 09/07/2015   Zoster, Live 11/06/2017, 05/07/2018    Family History  Problem Relation Age of Onset   Hypertension Mother    Allergies Mother    Allergies Father    CAD Father 34   Breast cancer Paternal Grandmother    Hypertension Other    Heart disease Other      Current Outpatient Medications:    acyclovir  (ZOVIRAX ) 400 MG tablet, TAKE ONE TABLET BY MOUTH TWICE DAILY, Disp: 60 tablet, Rfl: 2   albuterol  (VENTOLIN  HFA) 108 (90 Base) MCG/ACT inhaler, Inhale 2 puffs into the lungs every 6 hours as needed for wheezing or shortness of breath., Disp: 8.5 g, Rfl: 1   ALPRAZolam  (XANAX ) 0.5 MG tablet, Take 0.5 mg by mouth at bedtime., Disp: , Rfl:    azelastine  (ASTELIN ) 0.1 % nasal spray, Place 2 sprays into both nostrils 2 (two) times daily. Use in each nostril as directed, Disp: 30 mL, Rfl: 12   azithromycin  (ZITHROMAX ) 250 MG tablet, 250mg  once daily on  Monday Wed Friday, Disp: 15 tablet, Rfl: 6   bisoprolol  (ZEBETA ) 5 MG tablet, Take 1 tablet (5 mg total) by mouth daily., Disp: 30 tablet, Rfl: 2    Budeson-Glycopyrrol-Formoterol (  BREZTRI  AEROSPHERE) 160-9-4.8 MCG/ACT AERO, Inhale 2 puffs into the lungs in the morning and at bedtime., Disp: 10.7 g, Rfl: 11   cetirizine  (ZYRTEC ) 10 MG tablet, Take 1 tablet (10 mg total) by mouth daily., Disp: 30 tablet, Rfl: 11   cholecalciferol (VITAMIN D) 1000 UNITS tablet, Take 3,000 Units by mouth daily., Disp: , Rfl:    dextromethorphan (DELSYM) 30 MG/5ML liquid, Take 30 mg by mouth as needed for cough., Disp: , Rfl:    ezetimibe -simvastatin  (VYTORIN ) 10-40 MG tablet, Take 1 tablet by mouth daily., Disp: 90 tablet, Rfl: 1   fluticasone  (FLONASE) 50 MCG/ACT nasal spray, Place 1 spray into both nostrils daily as needed for allergies or rhinitis., Disp: , Rfl:    furosemide  (LASIX ) 40 MG tablet, Take 1 tablet (40 mg total) by mouth daily as needed., Disp: 30 tablet, Rfl: 0   HYDROcodone  bit-homatropine (HYCODAN) 5-1.5 MG/5ML syrup, Take 5 mLs by mouth every 4 (four) hours as needed for cough., Disp: 900 mL, Rfl: 0   ibuprofen (ADVIL) 200 MG tablet, Take 200 mg by mouth every 6 (six) hours as needed for moderate pain (pain score 4-6)., Disp: , Rfl:    ketoconazole (NIZORAL) 2 % cream, Apply 1 Application topically 2 (two) times daily as needed for irritation., Disp: , Rfl:    levothyroxine  (SYNTHROID ) 50 MCG tablet, Take 50 mcg by mouth every morning., Disp: , Rfl:    montelukast  (SINGULAIR ) 10 MG tablet, Take 10 mg by mouth at bedtime., Disp: , Rfl:    Multiple Vitamin (MULTIVITAMIN) tablet, Take 1 tablet by mouth daily., Disp: , Rfl:    omeprazole  (PRILOSEC) 40 MG capsule, Take 1 capsule (40 mg total) by mouth 2 (two) times daily. Take 30 minutes before a meal, Disp: 60 capsule, Rfl: 3   OXYGEN, Inhale 2 L into the lungs continuous., Disp: , Rfl:    potassium chloride  SA (KLOR-CON  M20) 20 MEQ tablet, Take 1 tablet (20 mEq total) by mouth daily., Disp: 30 tablet, Rfl: 0   sodium chloride  (OCEAN) 0.65 % SOLN nasal spray, Place 1 spray into both nostrils as  needed for congestion., Disp: , Rfl:    tamsulosin  (FLOMAX ) 0.4 MG CAPS capsule, Take 0.4 mg by mouth daily., Disp: , Rfl:    thyroid  (ARMOUR) 90 MG tablet, Take 90 mg by mouth daily., Disp: , Rfl:    triamcinolone  cream (KENALOG ) 0.1 %, Apply 1 application topically daily as needed (sun burn itch)., Disp: , Rfl:    Turmeric 400 MG CAPS, Take 1 capsule by mouth daily., Disp: , Rfl:    albuterol  (PROVENTIL ) (2.5 MG/3ML) 0.083% nebulizer solution, USE ONE VIAL IN NEBULIZER EVERY 6 HOURS AS NEEDED WHEEZING OR SHORTNESS OF BREATH (Patient not taking: Reported on 07/30/2024), Disp: 120 mL, Rfl: 12   ASSESS FULL RANGE PEAK METER DEVI, as directed. (Patient not taking: Reported on 07/30/2024), Disp: , Rfl:    EPINEPHrine  (EPI-PEN) 0.3 mg/0.3 mL DEVI, Inject 0.3 mg into the muscle as needed. (Patient not taking: Reported on 07/30/2024), Disp: , Rfl:    fexofenadine (ALLEGRA) 180 MG tablet, Take 180 mg by mouth daily. (Patient not taking: Reported on 07/30/2024), Disp: , Rfl:    predniSONE  (DELTASONE ) 10 MG tablet, Take 1 tablet (10 mg total) by mouth daily with breakfast. (Patient not taking: Reported on 07/30/2024), Disp: 30 tablet, Rfl: 6   sildenafil (REVATIO) 20 MG tablet, Take 20 mg by mouth daily as needed (ED). (Patient not taking: Reported on 07/30/2024), Disp: , Rfl:  Simethicone  (SIMETHICONE  ULTRA STRENGTH) 180 MG CAPS, Take 1 capsule (180 mg total) by mouth 3 (three) times daily as needed. (Patient not taking: Reported on 07/30/2024), Disp: 90 capsule, Rfl: 0   Treprostinil ([name]) 0.6 MG/ML SOLN, Inhale into the lungs 4 (four) times daily. Inhale 3 breaths per treatment session four times daily. Increase weekly to goal dose 9 to 12 breaths per treatment session four times daily (Patient not taking: Reported on 07/30/2024), Disp: , Rfl:       Objective:   Vitals:   07/30/24 1050  BP: 126/78  Pulse: 62  Temp: 97.8 F (36.6 C)  TempSrc: Oral  SpO2: 99%  Height: 5' 10 (1.778 m)     Estimated body mass index is 26.52 kg/m as calculated from the following:   Height as of this encounter: 5' 10 (1.778 m).   Weight as of 07/14/24: 184 lb 12.8 oz (83.8 kg).  @WEIGHTCHANGE @  There were no vitals filed for this visit.   Physical Exam   General: No distress. Looks fatigued and deconditioned O2 at rest: yues Cane present: n Sitting in wheel chair: yes Frail: yes Obese: no Neuro: Alert and Oriented x 3. GCS 15. Speech normal Psych: Pleasant Resp:  Barrel Chest - no.  Wheeze - no, Crackles - yes, No overt respiratory distress CVS: Normal heart sounds. Murmurs - no Ext: Stigmata of Connective Tissue Disease - no HEENT: Normal upper airway. PEERL +. No post nasal drip        Assessment/     Assessment & Plan ILD (interstitial lung disease) (HCC)  Chronic respiratory failure with hypoxia (HCC)  WHO group 3 pulmonary arterial hypertension (HCC)  Need for influenza vaccination  Multiple myeloma not having achieved remission (HCC)  Goals of care, counseling/discussion  Failure to thrive in adult  Weight loss, unintentional  Shortness of breath  Anxiety    PLAN Patient Instructions  WHO-3 Pulmonary Hypertension - new diagnosis 01/23/24. Mean pressure Hypertensio with [name] at dose iniitation - May 2025 Bronchospasm with Olga -July 2025  Plan  - Marked treprostinil DPI and Yutrepia as allergy  -Also mark as allergy   treprostinil nebulizer  ILD (interstitial lung disease) (HCC) Drug-induced pneumonitis  =- Fibrosis is worse - and progressively - intolerance to anti fibrotics esbreit and ofev   Plan - supportive care - Continue empiric prednisone  10 mg/day - Will refer for  Nerandomilast [will probably be a month before paperwork get started]  Chronic respiratory failure  -Progressively getting worse - deemed transplant ineligible by [facility] - Noticed your concern about chronic colonizationa  Plan  - continue o2 as  before [4 L at rest, 10 L with exertion] -s tart empiric azithromycin  250mg  Monday Wed, Friday   Myeloma    M protein < 0.8 in July 2024 and 0.5 in March 2025 and 0.6 in April 2025 and 0.7 in JSept 2025  Plan  - per [facility] and Dr Federico - Hopefully empiric prednisone  10 mg/day can help with this  Anxiety Chronic cough Class IV dyspnea Reduced appetite Failure to thrive  - affecting quality of life but glad you are now talking about it and addressing it  Plan  - meditation + acupuncuture  - continue Xanax  - -Continue Hycodan cough syrup 5 mg x 6  times daily as needed - Nexk step consider scheduled oral morphine syrup; will send message to Palliative care  Goals of care  -Life expectancy is diminished and we talked about it - REcommended MOST FORM  Plan  -  Focus on quality of life issues and treat the treatable - Optimize to the extent that you can still continue to work - take help at home  - talk to palliative care team about options for improving quality of life  Vaccine COunseling  Plan  - high dose flu shot 07/30/2024   Folowup 4 weeks video visit or face-to-face visit 4:30 PM with Dr. Geronimo or nurse practitioner to address continued quality-of-life issues    FOLLOWUP    Return in about 4 weeks (around 08/27/2024) for 30 min visit, Face to Face Visit, with Dr Geronimo.  ( Level 05 visit E&M 2024: Estb >= 40 min  visit type: on-site physical face to visit  in total care time and counseling or/and coordination of care by this undersigned MD - Dr Dorethia Geronimo. This includes one or more of the following on this same day 07/30/2024: pre-charting, chart review, note writing, documentation discussion of test results, diagnostic or treatment recommendations, prognosis, risks and benefits of management options, instructions, education, compliance or risk-factor reduction. It excludes time spent by the CMA or office staff in the care of the patient. Actual time 40  min)   SIGNATURE    Dr. Dorethia Geronimo, M.D., F.C.C.P,  Pulmonary and Critical Care Medicine Staff Physician, [facility] Center Director - Interstitial Lung Disease  Program  Pulmonary Fibrosis [facility] [place], [place], [address]  Pager: [phone], If no answer or between  15:00h - 7:00h: call [phone] Telephone: [phone]  4:41 PM 07/30/2024

## 2024-07-30 NOTE — Patient Instructions (Addendum)
 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 Mean pressure Hypertensio with [name] at dose iniitation - May 2025 Bronchospasm with Olga -July 2025  Plan  - Marked treprostinil DPI and Yutrepia as allergy  -Also mark as allergy   treprostinil nebulizer  ILD (interstitial lung disease) (HCC) Drug-induced pneumonitis  =- Fibrosis is worse - and progressively - intolerance to anti fibrotics esbreit and ofev   Plan - supportive care - Continue empiric prednisone  10 mg/day - Will refer for  Nerandomilast [will probably be a month before paperwork get started]  Chronic respiratory failure  -Progressively getting worse - deemed transplant ineligible by [facility] - Noticed your concern about chronic colonizationa  Plan  - continue o2 as before [4 L at rest, 10 L with exertion] -s tart empiric azithromycin  250mg  Monday Wed, Friday   Myeloma    M protein < 0.8 in July 2024 and 0.5 in March 2025 and 0.6 in April 2025 and 0.7 in JSept 2025  Plan  - per [facility] and Dr Federico - Hopefully empiric prednisone  10 mg/day can help with this  Anxiety Chronic cough Class IV dyspnea Reduced appetite Failure to thrive  - affecting quality of life but glad you are now talking about it and addressing it  Plan  - meditation + acupuncuture  - continue Xanax  - -Continue Hycodan cough syrup 5 mg x 6  times daily as needed - Nexk step consider scheduled oral morphine syrup; will send message to Palliative care  Goals of care  -Life expectancy is diminished and we talked about it - REcommended MOST FORM  Plan  -Focus on quality of life issues and treat the treatable - Optimize to the extent that you can still continue to work - take help at home  - talk to palliative care team about options for improving quality of life  Vaccine COunseling  Plan  - high dose flu shot 07/30/2024   Folowup 4 weeks video visit or face-to-face visit 4:30 PM with Dr. Geronimo or nurse  practitioner to address continued quality-of-life issues

## 2024-07-30 NOTE — Telephone Encounter (Signed)
 Dear [name]  Re Samuel [name]   He has declined significantly from his pulmonary fibrosis.  He is requiring 6/10 L of nasal cannula oxygen at rest.  He has class IV dyspnea and also failure to thrive with weight loss.  Plan - I discussed MOST form with them.  I also advised them to consider no CPR no intubation and avoid aggressive medical intervention but be okay for BiPAP or hospitalization with concurrent comfort.   - I would like you to build build on this conversation with them  - Supportive physical care at home  - This is another area for conversation.  We discussed long-term care insurance.  It seems like they are not ready for this.  They believe they can get someone on their own.  I also do not think they are ready for hospice because he wants to try different treatments.   - I did explain to him life expectancy is diminished.  While I thought he could be alive through the holidays I told him I would be surprised if he was alive in the spring or summer 2026.   - Pulmonary fibrosis:  - New antifibrotic got approved and I am trying to get this for him.   - Symptoms  - Significant dyspnea: You could consider oral morphine if appropriate  - Significant anxiety: Anything to help him here  - Other quality of life issues: I told him to coordinate all symptoms and quality of life issues through your office and outpatient palliative care    Please do not hesitate to reach me if you have any questions  SIGNATURE    Dr. Dorethia Cave, M.D., F.C.C.P,  Pulmonary and Critical Care Medicine Staff Physician, [facility] Director - Interstitial Lung Disease  Program  Pulmonary Fibrosis [facility] at [facility] [place], [place], [address]   Pager: [phone], If no answer  -> Check AMION or Try [phone] Telephone (clinical office): [phone] Telephone (research): [phone]  4:29 PM 07/30/2024

## 2024-08-02 NOTE — Telephone Encounter (Signed)
 Pt seen in clinic on 07/30/24 and this was discussed with the provider.

## 2024-08-09 ENCOUNTER — Emergency Department (HOSPITAL_COMMUNITY)

## 2024-08-09 ENCOUNTER — Telehealth: Payer: Self-pay

## 2024-08-09 ENCOUNTER — Inpatient Hospital Stay (HOSPITAL_COMMUNITY)
Admission: EM | Admit: 2024-08-09 | Discharge: 2024-08-14 | DRG: 196 | Disposition: A | Discharge status: Hospice | Attending: Internal Medicine | Admitting: Internal Medicine

## 2024-08-09 DIAGNOSIS — Z515 Encounter for palliative care: Secondary | ICD-10-CM | POA: Diagnosis not present

## 2024-08-09 DIAGNOSIS — N400 Enlarged prostate without lower urinary tract symptoms: Secondary | ICD-10-CM | POA: Diagnosis present

## 2024-08-09 DIAGNOSIS — Z7951 Long term (current) use of inhaled steroids: Secondary | ICD-10-CM

## 2024-08-09 DIAGNOSIS — J9621 Acute and chronic respiratory failure with hypoxia: Principal | ICD-10-CM | POA: Diagnosis present

## 2024-08-09 DIAGNOSIS — E039 Hypothyroidism, unspecified: Secondary | ICD-10-CM | POA: Diagnosis present

## 2024-08-09 DIAGNOSIS — I2510 Atherosclerotic heart disease of native coronary artery without angina pectoris: Secondary | ICD-10-CM | POA: Diagnosis present

## 2024-08-09 DIAGNOSIS — Z79890 Hormone replacement therapy (postmenopausal): Secondary | ICD-10-CM | POA: Diagnosis not present

## 2024-08-09 DIAGNOSIS — C9000 Multiple myeloma not having achieved remission: Secondary | ICD-10-CM | POA: Diagnosis present

## 2024-08-09 DIAGNOSIS — Z79899 Other long term (current) drug therapy: Secondary | ICD-10-CM | POA: Diagnosis not present

## 2024-08-09 DIAGNOSIS — R0902 Hypoxemia: Principal | ICD-10-CM

## 2024-08-09 DIAGNOSIS — D472 Monoclonal gammopathy: Secondary | ICD-10-CM | POA: Diagnosis present

## 2024-08-09 DIAGNOSIS — J690 Pneumonitis due to inhalation of food and vomit: Secondary | ICD-10-CM | POA: Diagnosis not present

## 2024-08-09 DIAGNOSIS — Z888 Allergy status to other drugs, medicaments and biological substances status: Secondary | ICD-10-CM

## 2024-08-09 DIAGNOSIS — Z9981 Dependence on supplemental oxygen: Secondary | ICD-10-CM | POA: Diagnosis not present

## 2024-08-09 DIAGNOSIS — K219 Gastro-esophageal reflux disease without esophagitis: Secondary | ICD-10-CM | POA: Diagnosis present

## 2024-08-09 DIAGNOSIS — Z66 Do not resuscitate: Secondary | ICD-10-CM | POA: Diagnosis present

## 2024-08-09 DIAGNOSIS — J4530 Mild persistent asthma, uncomplicated: Secondary | ICD-10-CM | POA: Diagnosis present

## 2024-08-09 DIAGNOSIS — J8489 Other specified interstitial pulmonary diseases: Secondary | ICD-10-CM | POA: Diagnosis not present

## 2024-08-09 DIAGNOSIS — Z803 Family history of malignant neoplasm of breast: Secondary | ICD-10-CM

## 2024-08-09 DIAGNOSIS — E1122 Type 2 diabetes mellitus with diabetic chronic kidney disease: Secondary | ICD-10-CM | POA: Diagnosis present

## 2024-08-09 DIAGNOSIS — Z885 Allergy status to narcotic agent status: Secondary | ICD-10-CM | POA: Diagnosis not present

## 2024-08-09 DIAGNOSIS — I129 Hypertensive chronic kidney disease with stage 1 through stage 4 chronic kidney disease, or unspecified chronic kidney disease: Secondary | ICD-10-CM | POA: Diagnosis present

## 2024-08-09 DIAGNOSIS — I2723 Pulmonary hypertension due to lung diseases and hypoxia: Secondary | ICD-10-CM | POA: Diagnosis present

## 2024-08-09 DIAGNOSIS — R54 Age-related physical debility: Secondary | ICD-10-CM | POA: Diagnosis present

## 2024-08-09 DIAGNOSIS — J849 Interstitial pulmonary disease, unspecified: Principal | ICD-10-CM | POA: Diagnosis present

## 2024-08-09 DIAGNOSIS — F419 Anxiety disorder, unspecified: Secondary | ICD-10-CM | POA: Diagnosis present

## 2024-08-09 DIAGNOSIS — Z881 Allergy status to other antibiotic agents status: Secondary | ICD-10-CM

## 2024-08-09 DIAGNOSIS — Z87891 Personal history of nicotine dependence: Secondary | ICD-10-CM | POA: Diagnosis not present

## 2024-08-09 DIAGNOSIS — R0602 Shortness of breath: Secondary | ICD-10-CM | POA: Diagnosis present

## 2024-08-09 DIAGNOSIS — Z7189 Other specified counseling: Secondary | ICD-10-CM | POA: Diagnosis not present

## 2024-08-09 DIAGNOSIS — E782 Mixed hyperlipidemia: Secondary | ICD-10-CM | POA: Diagnosis present

## 2024-08-09 DIAGNOSIS — I10 Essential (primary) hypertension: Secondary | ICD-10-CM | POA: Diagnosis not present

## 2024-08-09 DIAGNOSIS — Z880 Allergy status to penicillin: Secondary | ICD-10-CM

## 2024-08-09 DIAGNOSIS — Z8249 Family history of ischemic heart disease and other diseases of the circulatory system: Secondary | ICD-10-CM

## 2024-08-09 DIAGNOSIS — J9620 Acute and chronic respiratory failure, unspecified whether with hypoxia or hypercapnia: Secondary | ICD-10-CM | POA: Diagnosis present

## 2024-08-09 LAB — CBC WITH DIFFERENTIAL/PLATELET
Abs Immature Granulocytes: 0.04 K/uL (ref 0.00–0.07)
Basophils Absolute: 0 K/uL (ref 0.0–0.1)
Basophils Relative: 0 %
Eosinophils Absolute: 0.1 K/uL (ref 0.0–0.5)
Eosinophils Relative: 1 %
HCT: 40.8 % (ref 39.0–52.0)
Hemoglobin: 13.3 g/dL (ref 13.0–17.0)
Immature Granulocytes: 0 %
Lymphocytes Relative: 9 %
Lymphs Abs: 0.8 K/uL (ref 0.7–4.0)
MCH: 31 pg (ref 26.0–34.0)
MCHC: 32.6 g/dL (ref 30.0–36.0)
MCV: 95.1 fL (ref 80.0–100.0)
Monocytes Absolute: 0.4 K/uL (ref 0.1–1.0)
Monocytes Relative: 4 %
Neutro Abs: 8.4 K/uL — ABNORMAL HIGH (ref 1.7–7.7)
Neutrophils Relative %: 86 %
Platelets: 245 K/uL (ref 150–400)
RBC: 4.29 MIL/uL (ref 4.22–5.81)
RDW: 12.8 % (ref 11.5–15.5)
WBC: 9.7 K/uL (ref 4.0–10.5)
nRBC: 0 % (ref 0.0–0.2)

## 2024-08-09 LAB — COMPREHENSIVE METABOLIC PANEL WITH GFR
ALT: 27 U/L (ref 0–44)
AST: 34 U/L (ref 15–41)
Albumin: 4 g/dL (ref 3.5–5.0)
Alkaline Phosphatase: 68 U/L (ref 38–126)
Anion gap: 8 (ref 5–15)
BUN: 17 mg/dL (ref 8–23)
CO2: 30 mmol/L (ref 22–32)
Calcium: 9.4 mg/dL (ref 8.9–10.3)
Chloride: 94 mmol/L — ABNORMAL LOW (ref 98–111)
Creatinine, Ser: 0.61 mg/dL (ref 0.61–1.24)
GFR, Estimated: >60 mL/min (ref >60)
Glucose, Bld: 99 mg/dL (ref 70–99)
Potassium: 4.7 mmol/L (ref 3.5–5.1)
Sodium: 132 mmol/L — ABNORMAL LOW (ref 135–145)
Total Bilirubin: 0.4 mg/dL (ref 0.0–1.2)
Total Protein: 7.9 g/dL (ref 6.5–8.1)

## 2024-08-09 LAB — RESP PANEL BY RT-PCR (RSV, FLU A&B, COVID)  RVPGX2
Influenza A by PCR: NEGATIVE
Influenza B by PCR: NEGATIVE
Resp Syncytial Virus by PCR: NEGATIVE
SARS Coronavirus 2 by RT PCR: NEGATIVE

## 2024-08-09 MED ORDER — ALBUTEROL SULFATE (2.5 MG/3ML) 0.083% IN NEBU: 2.5000 mg | INHALATION_SOLUTION | Freq: Four times a day (QID) | RESPIRATORY_TRACT | Status: DC

## 2024-08-09 MED ORDER — ENOXAPARIN SODIUM 40 MG/0.4ML IJ SOSY
40.0000 mg | PREFILLED_SYRINGE | INTRAMUSCULAR | Status: DC
  Administered 2024-08-09 – 2024-08-13 (×5): 40 mg via SUBCUTANEOUS
  Filled 2024-08-09 (×5): qty 0.4

## 2024-08-09 MED ORDER — SODIUM CHLORIDE 0.9 % IV SOLN
1.0000 g | INTRAVENOUS | Status: DC
  Administered 2024-08-09 – 2024-08-13 (×5): 1 g via INTRAVENOUS
  Filled 2024-08-09 (×5): qty 10

## 2024-08-09 MED ORDER — HYDROCODONE BIT-HOMATROP MBR 5-1.5 MG/5ML PO SOLN
5.0000 mL | ORAL | Status: DC | PRN
Start: 2024-08-09 — End: 2024-08-10

## 2024-08-09 MED ORDER — IPRATROPIUM-ALBUTEROL 0.5-2.5 (3) MG/3ML IN SOLN
3.0000 mL | Freq: Once | RESPIRATORY_TRACT | Status: AC
  Administered 2024-08-09: 3 mL via RESPIRATORY_TRACT
  Filled 2024-08-09: qty 3

## 2024-08-09 MED ORDER — ONDANSETRON HCL 4 MG/2ML IJ SOLN: 4.0000 mg | Freq: Four times a day (QID) | INTRAMUSCULAR | Status: DC | PRN

## 2024-08-09 MED ORDER — LEVOTHYROXINE SODIUM 50 MCG PO TABS
50.0000 ug | ORAL_TABLET | Freq: Every day | ORAL | Status: DC
  Administered 2024-08-12 – 2024-08-14 (×3): 50 ug via ORAL
  Filled 2024-08-09 (×5): qty 1

## 2024-08-09 MED ORDER — MORPHINE SULFATE (PF) 2 MG/ML IV SOLN: 2.0000 mg | INTRAVENOUS | Status: DC | PRN

## 2024-08-09 MED ORDER — PANTOPRAZOLE SODIUM 40 MG PO TBEC: 40.0000 mg | DELAYED_RELEASE_TABLET | Freq: Every day | ORAL | Status: DC

## 2024-08-09 MED ORDER — ACETAMINOPHEN 650 MG RE SUPP: 650.0000 mg | Freq: Four times a day (QID) | RECTAL | Status: DC | PRN

## 2024-08-09 MED ORDER — ALPRAZOLAM 0.5 MG PO TABS: 0.5000 mg | ORAL_TABLET | Freq: Every day | ORAL | Status: DC

## 2024-08-09 MED ORDER — TAMSULOSIN HCL 0.4 MG PO CAPS
0.4000 mg | ORAL_CAPSULE | Freq: Every day | ORAL | Status: DC
  Administered 2024-08-11 – 2024-08-14 (×4): 0.4 mg via ORAL
  Filled 2024-08-09 (×5): qty 1

## 2024-08-09 MED ORDER — ONDANSETRON HCL 4 MG PO TABS: 4.0000 mg | ORAL_TABLET | Freq: Four times a day (QID) | ORAL | Status: DC | PRN

## 2024-08-09 MED ORDER — TAMSULOSIN HCL 0.4 MG PO CAPS: 0.4000 mg | ORAL_CAPSULE | Freq: Every day | ORAL | Status: DC

## 2024-08-09 MED ORDER — IOHEXOL 350 MG/ML SOLN
75.0000 mL | Freq: Once | INTRAVENOUS | Status: AC | PRN
  Administered 2024-08-09: 75 mL via INTRAVENOUS

## 2024-08-09 MED ORDER — ACETAMINOPHEN 325 MG PO TABS: 650.0000 mg | ORAL_TABLET | Freq: Four times a day (QID) | ORAL | Status: DC | PRN

## 2024-08-09 MED ORDER — ALPRAZOLAM 0.5 MG PO TABS
1.0000 mg | ORAL_TABLET | Freq: Two times a day (BID) | ORAL | Status: DC | PRN
  Administered 2024-08-09 – 2024-08-10 (×2): 1 mg via ORAL
  Filled 2024-08-09 (×2): qty 2

## 2024-08-09 MED ORDER — HYDRALAZINE HCL 20 MG/ML IJ SOLN: 10.0000 mg | Freq: Four times a day (QID) | INTRAMUSCULAR | Status: DC | PRN

## 2024-08-09 MED ORDER — IPRATROPIUM-ALBUTEROL 0.5-2.5 (3) MG/3ML IN SOLN
3.0000 mL | Freq: Four times a day (QID) | RESPIRATORY_TRACT | Status: DC
  Administered 2024-08-09 – 2024-08-10 (×5): 3 mL via RESPIRATORY_TRACT
  Filled 2024-08-09 (×4): qty 3

## 2024-08-09 MED ORDER — DEXTROSE IN LACTATED RINGERS 5 % IV SOLN: INTRAVENOUS | Status: DC

## 2024-08-09 MED ORDER — LEVOTHYROXINE SODIUM 50 MCG PO TABS: 50.0000 ug | ORAL_TABLET | Freq: Every morning | ORAL | Status: DC

## 2024-08-09 MED ORDER — IPRATROPIUM BROMIDE 0.02 % IN SOLN: 0.5000 mg | Freq: Four times a day (QID) | RESPIRATORY_TRACT | Status: DC

## 2024-08-09 MED ORDER — SODIUM CHLORIDE 0.9 % IV SOLN
100.0000 mg | Freq: Two times a day (BID) | INTRAVENOUS | Status: DC
  Administered 2024-08-09 – 2024-08-14 (×10): 100 mg via INTRAVENOUS
  Filled 2024-08-09 (×11): qty 100

## 2024-08-09 MED ORDER — ALPRAZOLAM 0.5 MG PO TABS
0.5000 mg | ORAL_TABLET | Freq: Once | ORAL | Status: AC
  Administered 2024-08-09: 0.5 mg via ORAL
  Filled 2024-08-09: qty 1

## 2024-08-09 MED ORDER — PANTOPRAZOLE SODIUM 40 MG PO TBEC
40.0000 mg | DELAYED_RELEASE_TABLET | Freq: Two times a day (BID) | ORAL | Status: DC
  Administered 2024-08-09 – 2024-08-14 (×9): 40 mg via ORAL
  Filled 2024-08-09 (×10): qty 1

## 2024-08-09 MED ORDER — ALBUTEROL SULFATE (2.5 MG/3ML) 0.083% IN NEBU
2.5000 mg | INHALATION_SOLUTION | RESPIRATORY_TRACT | Status: DC | PRN
  Administered 2024-08-11 – 2024-08-12 (×3): 2.5 mg via RESPIRATORY_TRACT
  Filled 2024-08-09 (×3): qty 3

## 2024-08-09 NOTE — ED Notes (Signed)
Assisted pt with the urinal.

## 2024-08-09 NOTE — ED Triage Notes (Signed)
 PT arrives via EMS from home with complaints of increased shortness of breath over the past 3 days. PT has a hx of multiple myeloma, and wears 6L via Nasal cannula at baseline.  PT also endorses anxiety, and states that worsens his shortness of breath.

## 2024-08-09 NOTE — Telephone Encounter (Signed)
 Spoke with pt via telephone to inform pt that Levon would like for the pt to go to the ED for further evaluation.  Pt stated he really does not want to go to the ED because he's going to die there.  Explained to pt that at the ED they will be able to provide the pt with hopefully some medications and treatments to help improve his breathing.  Pt verbalized understanding and stated he will have his wife call EMS.  Notified Nikki of the pt's response.

## 2024-08-09 NOTE — ED Notes (Signed)
 Gave pt ice chips RN aware

## 2024-08-09 NOTE — Telephone Encounter (Signed)
 Pt and spouse called stating that the pt is having severe difficulty breathing.  Pt is extremely SOB and wants to know if they could get a portable oxygen tank that hold more O2.  Pt stated at rest he's on 8L via [place] w/SATS 92%.  On exertion, pt's SATS drop to around 75%; therefore, the pt increases his oxygen up to 15L.  Pt denied pain 0/10; BP 158/92;and  HR 72.  Pt gets his oxygen supplies from [employer].  Stated to pt I don't think the portable tanks hold more than 15L.  Stated this nurse will speak with [name] Levon Borer, NP to see what she recommends and will f/u with pt.  Pt and spouse both verbalized understanding.

## 2024-08-09 NOTE — Telephone Encounter (Signed)
 Patient will need to go to ED for further evaluation. 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 I will follow-up with him later this week or in the hospital if he gets admitted. Thanks

## 2024-08-09 NOTE — ED Provider Notes (Signed)
  EMERGENCY DEPARTMENT AT [facility] Provider Note   CSN: [id number] Arrival date & time: 08/09/24  1423     Patient presents with: Shortness of Breath   Samuel [name] is a 68 y.o. male.   This is a 68 year old chronic hypoxic respiratory failure secondary to tissue lung disease, multiple myeloma, asthma presenting emergency department for worsening shortness of breath and creasing oxygen requirements.  Notes that he is typically on 6 L nasal cannula, but notes that oxygen saturation dropping down into the 70s and having to go up to 15 L.  Feeling weak and lightheaded as well when attempting to get up.  His weakness has worsened.  This is progressively worsening since Thursday/Friday.  Denies infectious symptoms.  No hemoptysis. No CP, lower extremity edema. Told to come to ED by Palliative care.    Shortness of Breath      Prior to Admission medications   Medication Sig Start Date End Date Taking? Authorizing Provider  acyclovir  (ZOVIRAX ) 400 MG tablet TAKE ONE TABLET BY MOUTH TWICE DAILY 06/23/24   Dorsey, John T IV, MD  albuterol  (PROVENTIL ) (2.5 MG/3ML) 0.083% nebulizer solution USE ONE VIAL IN NEBULIZER EVERY 6 HOURS AS NEEDED WHEEZING OR SHORTNESS OF BREATH Patient not taking: Reported on 07/30/2024 09/12/23   Geronimo Amel, MD  albuterol  (VENTOLIN  HFA) 108 (90 Base) MCG/ACT inhaler Inhale 2 puffs into the lungs every 6 hours as needed for wheezing or shortness of breath. 06/11/24   Federico Norleen ONEIDA MADISON, MD  ALPRAZolam  (XANAX ) 0.5 MG tablet Take 0.5 mg by mouth at bedtime.    [provider]  ASSESS FULL RANGE PEAK METER DEVI as directed. Patient not taking: Reported on 07/30/2024 07/15/11   [provider]  azelastine  (ASTELIN ) 0.1 % nasal spray Place 2 sprays into both nostrils 2 (two) times daily. Use in each nostril as directed 10/07/23   Soldatova, Liuba, MD  azithromycin  (ZITHROMAX ) 250 MG tablet 250mg  once daily on  Monday Wed Friday  07/30/24   Geronimo Amel, MD  bisoprolol  (ZEBETA ) 5 MG tablet Take 1 tablet (5 mg total) by mouth daily. 10/17/21   Geronimo Amel, MD  Budeson-Glycopyrrol-Formoterol (BREZTRI  AEROSPHERE) 160-9-4.8 MCG/ACT AERO Inhale 2 puffs into the lungs in the morning and at bedtime. 10/13/23   Parrett, Madelin RAMAN, NP  cetirizine  (ZYRTEC ) 10 MG tablet Take 1 tablet (10 mg total) by mouth daily. 10/07/23   Soldatova, Liuba, MD  cholecalciferol (VITAMIN D) 1000 UNITS tablet Take 3,000 Units by mouth daily.    [provider]  dextromethorphan (DELSYM) 30 MG/5ML liquid Take 30 mg by mouth as needed for cough.    [provider]  EPINEPHrine  (EPI-PEN) 0.3 mg/0.3 mL DEVI Inject 0.3 mg into the muscle as needed. Patient not taking: Reported on 07/30/2024    [provider]  ezetimibe -simvastatin  (VYTORIN ) 10-40 MG tablet Take 1 tablet by mouth daily. 07/27/24   Patwardhan, Newman JINNY, MD  fexofenadine (ALLEGRA) 180 MG tablet Take 180 mg by mouth daily. Patient not taking: Reported on 07/30/2024    [provider]  fluticasone  (FLONASE) 50 MCG/ACT nasal spray Place 1 spray into both nostrils daily as needed for allergies or rhinitis.    [provider]  furosemide  (LASIX ) 40 MG tablet Take 1 tablet (40 mg total) by mouth daily as needed. 06/10/24   Geronimo Amel, MD  HYDROcodone  bit-homatropine (HYCODAN) 5-1.5 MG/5ML syrup Take 5 mLs by mouth every 4 (four) hours as needed for cough. 07/11/24  Geronimo Amel, MD  ibuprofen (ADVIL) 200 MG tablet Take 200 mg by mouth every 6 (six) hours as needed for moderate pain (pain score 4-6).    [provider]  ketoconazole (NIZORAL) 2 % cream Apply 1 Application topically 2 (two) times daily as needed for irritation. 09/21/21   [provider]  levothyroxine  (SYNTHROID ) 50 MCG tablet Take 50 mcg by mouth every morning. 11/14/23   [provider]  montelukast  (SINGULAIR ) 10 MG tablet Take 10 mg by mouth  at bedtime.    [provider]  Multiple Vitamin (MULTIVITAMIN) tablet Take 1 tablet by mouth daily.    [provider]  omeprazole  (PRILOSEC) 40 MG capsule Take 1 capsule (40 mg total) by mouth 2 (two) times daily. Take 30 minutes before a meal 05/11/24   Soldatova, Liuba, MD  OXYGEN Inhale 2 L into the lungs continuous.    [provider]  potassium chloride  SA (KLOR-CON  M20) 20 MEQ tablet Take 1 tablet (20 mEq total) by mouth daily. 06/10/24   Geronimo Amel, MD  predniSONE  (DELTASONE ) 10 MG tablet Take 1 tablet (10 mg total) by mouth daily with breakfast. Patient not taking: Reported on 07/30/2024 05/14/24   Geronimo Amel, MD  sildenafil (REVATIO) 20 MG tablet Take 20 mg by mouth daily as needed (ED). Patient not taking: Reported on 07/30/2024 07/12/19   [provider]  Simethicone  (SIMETHICONE  ULTRA STRENGTH) 180 MG CAPS Take 1 capsule (180 mg total) by mouth 3 (three) times daily as needed. Patient not taking: Reported on 07/30/2024 01/06/22   Bero, Michael M, MD  sodium chloride  (OCEAN) 0.65 % SOLN nasal spray Place 1 spray into both nostrils as needed for congestion.    [provider]  tamsulosin  (FLOMAX ) 0.4 MG CAPS capsule Take 0.4 mg by mouth daily.    [provider]  thyroid  (ARMOUR) 90 MG tablet Take 90 mg by mouth daily. 06/23/24   [provider]  Treprostinil ([name]) 0.6 MG/ML SOLN Inhale into the lungs 4 (four) times daily. Inhale 3 breaths per treatment session four times daily. Increase weekly to goal dose 9 to 12 breaths per treatment session four times daily Patient not taking: Reported on 07/30/2024    [provider]  triamcinolone  cream (KENALOG ) 0.1 % Apply 1 application topically daily as needed (sun burn itch). 01/15/21   [provider]  Turmeric 400 MG CAPS Take 1 capsule by mouth daily.    [provider]    Allergies: Amlodipine , Beclomethasone, Ciclesonide, Clarithromycin,  Ofev  [nintedanib], Penicillins, Rosuvastatin , Trazodone, and Yutrepia [treprostinil sodium]    Review of Systems  Respiratory:  Positive for shortness of breath.     Updated Vital Signs BP (!) 158/96 (BP Location: Right Arm)   Pulse 69   Temp 98.8 F (37.1 C)   Resp 18   Wt 83 kg Comment: Bed scale  SpO2 99%   BMI 26.26 kg/m   Physical Exam Vitals and nursing note reviewed.  Cardiovascular:     Rate and Rhythm: Normal rate and regular rhythm.  Pulmonary:     Effort: Tachypnea present.     Breath sounds: Decreased breath sounds present.  Musculoskeletal:     Right lower leg: No edema.     Left lower leg: No edema.  Skin:    General: Skin is warm.     Capillary Refill: Capillary refill takes less than 2 seconds.  Neurological:     Mental Status: He is oriented to person, place, and time.  Psychiatric:        Mood and Affect: Mood normal.        Behavior: Behavior normal.     (all labs ordered are listed, but only abnormal results are displayed) Labs Reviewed  CBC WITH DIFFERENTIAL/PLATELET - Abnormal; Notable for the following components:      Result Value   Neutro Abs 8.4 (*)    All other components within normal limits  COMPREHENSIVE METABOLIC PANEL WITH GFR - Abnormal; Notable for the following components:   Sodium 132 (*)    Chloride 94 (*)    All other components within normal limits  RESP PANEL BY RT-PCR (RSV, FLU A&B, COVID)  RVPGX2  HIV ANTIBODY (ROUTINE TESTING W REFLEX)  CBC  CREATININE, SERUM  CBC  COMPREHENSIVE METABOLIC PANEL WITH GFR    EKG: EKG Interpretation Date/Time:  Monday August 09 2024 14:41:57 EDT Ventricular Rate:  64 PR Interval:  202 QRS Duration:  94 QT Interval:  396 QTC Calculation: 409 R Axis:   -8  Text Interpretation: Sinus rhythm Left ventricular hypertrophy Minimal ST elevation, lateral leads Confirmed by Neysa Clap [phone]) on 08/09/2024 4:56:34 PM  Radiology: CT Angio Chest PE W and/or Wo Contrast Result Date:  08/09/2024 CLINICAL DATA:  Increasing shortness of breath.  Multiple myeloma. EXAM: CT ANGIOGRAPHY CHEST WITH CONTRAST TECHNIQUE: Multidetector CT imaging of the chest was performed using the standard protocol during bolus administration of intravenous contrast. Multiplanar CT image reconstructions and MIPs were obtained to evaluate the vascular anatomy. RADIATION DOSE REDUCTION: This exam was performed according to the departmental dose-optimization program which includes automated exposure control, adjustment of the mA and/or kV according to patient size and/or use of iterative reconstruction technique. CONTRAST:  75mL OMNIPAQUE  IOHEXOL  350 MG/ML SOLN COMPARISON:  12/24/2023 and CT chest 11/25/2022. FINDINGS: Cardiovascular: Image quality is degraded by respiratory motion, limiting the evaluation of the segmental and subsegmental pulmonary arteries in the mid and lower lung zones. Otherwise, no pulmonary embolus. Atherosclerotic calcification of the aorta, aortic valve and coronary arteries. Heart is enlarged. Small pericardial effusion. Mediastinum/Nodes: Subcarinal lymph node measures 1.8 cm and is likely reactive in etiology. Additional mediastinal and hilar lymph nodes are not enlarged by CT size criteria. No axillary adenopathy. Esophagus is grossly unremarkable. Lungs/Pleura: Diffuse pulmonary parenchymal ground-glass superimposed on interstitial and subpleural pulmonary fibrosis, better evaluated on CT chest 11/25/2022. Mild consolidation in the lower lobes. No pleural fluid. Airway is unremarkable. Upper Abdomen: Patient's arms create streak artifact, degrading image quality. Visualized portions of the liver, gallbladder, adrenal glands, kidneys, spleen, pancreas, stomach and bowel are otherwise grossly unremarkable. No upper abdominal adenopathy. Musculoskeletal: Degenerative changes in the spine. Review of the MIP images confirms the above findings. IMPRESSION: 1. Evaluation of the mid and lower lung  zone segmental and subsegmental pulmonary arteries is limited by respiratory motion. Otherwise, negative for pulmonary embolus. 2. Bilateral lower lobe consolidation may be due to aspiration. 3. Small pericardial effusion. 4. Mild diffuse pulmonary parenchymal ground-glass is nonspecific and may be infectious/inflammatory in etiology. 5. Underlying interstitial lung disease, better evaluated and characterized on CT chest 11/25/2022. 6. Aortic atherosclerosis (ICD10-I70.0). Coronary artery calcification. Electronically Signed   By: Newell Eke M.D.   On: 08/09/2024 17:54   DG Chest Portable 1 View Result Date: 08/09/2024 CLINICAL DATA:  Short of breath EXAM: PORTABLE CHEST 1 VIEW COMPARISON:  10/28/2023, 12/24/2023 FINDINGS: Single frontal view of the chest demonstrates stable enlargement of the cardiac silhouette. Lung volumes are diminished, with extensive pulmonary scarring and  fibrosis again identified. Increased ground-glass opacities are seen within the bilateral lungs since prior exam, could reflect superimposed edema or alveolitis. No effusion or pneumothorax. No acute bony abnormalities. IMPRESSION: 1. Continued findings of pulmonary fibrosis, with low lung volumes noted. 2. Increased ground-glass opacities within the lungs, which could reflect alveolitis or edema. Electronically Signed   By: Ozell Daring M.D.   On: 08/09/2024 17:00     .Critical Care  Performed by: Neysa Caron PARAS, DO Authorized by: Neysa Caron PARAS, DO   Critical care provider statement:    Critical care time (minutes):  30   Critical care was necessary to treat or prevent imminent or life-threatening deterioration of the following conditions:  Respiratory failure   Critical care was time spent personally by me on the following activities:  Development of treatment plan with patient or surrogate, discussions with consultants, evaluation of patient's response to treatment, examination of patient, ordering and review of  laboratory studies, ordering and review of radiographic studies, ordering and performing treatments and interventions, pulse oximetry, re-evaluation of patient's condition and review of old charts    Medications Ordered in the ED  HYDROcodone  bit-homatropine (HYCODAN) 5-1.5 MG/5ML syrup 5 mL (has no administration in time range)  enoxaparin  (LOVENOX ) injection 40 mg (40 mg Subcutaneous Given 08/09/24 2012)  dextrose 5 % in lactated ringers  infusion ( Intravenous New Bag/Given 08/09/24 2012)  acetaminophen  (TYLENOL ) tablet 650 mg (has no administration in time range)    Or  acetaminophen  (TYLENOL ) suppository 650 mg (has no administration in time range)  morphine (PF) 2 MG/ML injection 2 mg (has no administration in time range)  ondansetron  (ZOFRAN ) tablet 4 mg (has no administration in time range)    Or  ondansetron  (ZOFRAN ) injection 4 mg (has no administration in time range)  albuterol  (PROVENTIL ) (2.5 MG/3ML) 0.083% nebulizer solution 2.5 mg (has no administration in time range)  hydrALAZINE  (APRESOLINE ) injection 10 mg (has no administration in time range)  cefTRIAXone (ROCEPHIN) 1 g in sodium chloride  0.9 % 100 mL IVPB (1 g Intravenous New Bag/Given 08/09/24 2011)  doxycycline (VIBRAMYCIN) 100 mg in sodium chloride  0.9 % 250 mL IVPB (100 mg Intravenous New Bag/Given 08/09/24 2009)  ipratropium-albuterol  (DUONEB) 0.5-2.5 (3) MG/3ML nebulizer solution 3 mL (3 mLs Nebulization Given 08/09/24 2013)  tamsulosin  (FLOMAX ) capsule 0.4 mg (has no administration in time range)  levothyroxine  (SYNTHROID ) tablet 50 mcg (has no administration in time range)  pantoprazole  (PROTONIX ) EC tablet 40 mg (40 mg Oral Given 08/09/24 2012)  ALPRAZolam  (XANAX ) tablet 1 mg (1 mg Oral Given 08/09/24 2012)  ipratropium-albuterol  (DUONEB) 0.5-2.5 (3) MG/3ML nebulizer solution 3 mL (3 mLs Nebulization Given 08/09/24 1536)  ALPRAZolam  (XANAX ) tablet 0.5 mg (0.5 mg Oral Given 08/09/24 1535)  iohexol  (OMNIPAQUE ) 350  MG/ML injection 75 mL (75 mLs Intravenous Contrast Given 08/09/24 1712)    Clinical Course as of 08/09/24 2135  Mon Aug 09, 2024  1541 CBC with Differential(!) No leukocytosis to suggest stomach infection.  No anemia that could account for his extreme dyspnea/weakness. [TY]  1722 DG Chest Portable 1 View IMPRESSION: 1. Continued findings of pulmonary fibrosis, with low lung volumes noted. 2. Increased ground-glass opacities within the lungs, which could reflect alveolitis or edema.   [TY]  1800 CT Angio Chest PE W and/or Wo Contrast IMPRESSION: 1. Evaluation of the mid and lower lung zone segmental and subsegmental pulmonary arteries is limited by respiratory motion. Otherwise, negative for pulmonary embolus. 2. Bilateral lower lobe consolidation may be due to aspiration.  3. Small pericardial effusion. 4. Mild diffuse pulmonary parenchymal ground-glass is nonspecific and may be infectious/inflammatory in etiology. 5. Underlying interstitial lung disease, better evaluated and characterized on CT chest 11/25/2022. 6. Aortic atherosclerosis (ICD10-I70.0). Coronary artery calcification.   Electronically Signed   By: Newell Eke M.D.   On: 08/09/2024 17:54   [TY]  1801 Given patient's significant increased oxygen requirements will admit for further management. [TY]  [id number] Spoke with hospitalist agrees to admit patient. [TY]    Clinical Course User Index [TY] Neysa Caron PARAS, DO                                 Medical Decision Making This is a 68 year old male complicated past medical history chronic hypoxic respiratory failure on supplemental oxygen, interstitial lung disease wounds, multiple myeloma, diabetes, CKD presenting to the emergency department for shortness of breath.  He is tachypneic, speaking in short sentences.  Maintaining his oxygen saturation in the low 90s on his 6 L oxygen.  He is instructed come to the emergency department by palliative care as he has had  increasing oxygen requirements with exertion and sounds as though he is becoming quite symptomatic.  Per chart review recently saw pulmonology who has put him on prednisone  and azithromycin .  Patient and patient's wife note has not seen much improvement of symptoms.  Will get screening labs, chest x-ray.  Concern for possible PE since symptoms abruptly worsened.  No fever or tachycardia to suggest infection.  Given his increased oxygen requirements chest pain and symptoms will likely need admission to further coordinate his care.  Amount and/or Complexity of Data Reviewed External Data Reviewed: radiology and notes.    Details: Echo last year with normal EF.  He also appears that he was started on prednisone  and azithromycin  on the 10th for his pulmonologist note.  Also, seems to be terminal stages of his pulmonary fibrosis and not a candidate for lung transplant. Labs: ordered. Decision-making details documented in ED Course. Radiology: ordered and independent interpretation performed. Decision-making details documented in ED Course. ECG/medicine tests: independent interpretation performed.  Risk Prescription drug management. Decision regarding hospitalization. Diagnosis or treatment significantly limited by social determinants of health.       Final diagnoses:  Hypoxia    ED Discharge Orders     None          Neysa Caron PARAS, DO 08/09/24 2135

## 2024-08-09 NOTE — H&P (Signed)
 History and Physical    Patient: Samuel [name] FMW:[id number] DOB: 1955/11/02 DOA: 08/09/2024 DOS: the patient was seen and examined on 08/09/2024 PCP: Seabron Lenis, MD  Patient coming from: Home  Chief Complaint:  Chief Complaint  Patient presents with   Shortness of Breath   HPI: Samuel [name] is a 68 y.o. male with medical history significant of interstitial lung disease which is advanced being followed by pulmonology, hypothyroidism, GERD, asthma, multiple myeloma, who presented to the ER with progressive worsening of shortness of breath over the last 3 days.  He normally furious 6 L of oxygen at baseline.  On arrival patient was requiring up to 10 L of oxygen.  Also associated with some anxiety.  Patient is weak.  Denied any sick contacts.  Denied any fever or chills.  He is coughing.  Per patient this is worse from his baseline.  Workup so far in the ER showed no PE.  Suspicion for bilateral lower lobe consolidation for aspiration in addition to worsening interstitial lung disease.  Patient being admitted with acute on chronic hypoxic respiratory failure.  Review of Systems: As mentioned in the history of present illness. All other systems reviewed and are negative. Past Medical History:  Diagnosis Date   Asthma    GERD (gastroesophageal reflux disease)    High cholesterol    under control.    History of blood transfusion    Hypothyroidism    Interstitial lung disease (HCC)    Multiple myeloma (HCC)    Perennial allergic rhinitis    Pneumonia    Sciatic pain, right    Seasonal allergic rhinitis    Thyroid  disease    Past Surgical History:  Procedure Laterality Date   BRONCHIAL BIOPSY  06/18/2021   Procedure: BRONCHIAL BIOPSIES;  Surgeon: Shelah Lamar RAMAN, MD;  Location: [facility];  Service: Cardiopulmonary;;   BRONCHIAL BIOPSY  08/28/2021   Procedure: BRONCHIAL BIOPSIES;  Surgeon: Brenna Adine CROME, DO;  Location: [facility];  Service: Cardiopulmonary;;   BRONCHIAL  WASHINGS  06/18/2021   Procedure: BRONCHIAL WASHINGS;  Surgeon: Shelah Lamar RAMAN, MD;  Location: [facility];  Service: Cardiopulmonary;;   BRONCHIAL WASHINGS  08/28/2021   Procedure: BRONCHIAL WASHINGS;  Surgeon: Brenna Adine CROME, DO;  Location: [facility];  Service: Cardiopulmonary;;   NASAL SINUS SURGERY     NECK SURGERY  1996   RIGHT HEART CATH N/A 01/23/2024   Procedure: RIGHT HEART CATH;  Surgeon: Elmira Newman JINNY, MD;  Location: [facility];  Service: Cardiovascular;  Laterality: N/A;   VIDEO BRONCHOSCOPY N/A 06/18/2021   Procedure: VIDEO BRONCHOSCOPY WITH FLUORO;  Surgeon: Shelah Lamar RAMAN, MD;  Location: [facility];  Service: Cardiopulmonary;  Laterality: N/A;   VIDEO BRONCHOSCOPY N/A 08/28/2021   Procedure: VIDEO BRONCHOSCOPY WITH FLUORO;  Surgeon: Brenna Adine CROME, DO;  Location: [facility];  Service: Cardiopulmonary;  Laterality: N/A;   Social History:  reports that he quit smoking about 41 years ago. His smoking use included cigarettes. He started smoking about 44 years ago. He has a 0.3 pack-year smoking history. He has never used smokeless tobacco. He reports that he does not currently use alcohol. He reports that he does not use drugs.  Allergies  Allergen Reactions   Amlodipine  Swelling   Beclomethasone Other (See Comments)    unknown   Ciclesonide Other (See Comments)    unknown   Clarithromycin Other (See Comments)    Hiccups   Ofev  [Nintedanib] Other (See Comments)    GI  bleeding   Penicillins Other (See Comments)    Child hood unsure   Rosuvastatin      Other Reaction(s): hand swelling   Trazodone     Other Reaction(s): sweating   Samuel [name] [Treprostinil Sodium] Cough    Bronchial spasms, sore throat.    Family History  Problem Relation Age of Onset   Hypertension Mother    Allergies Mother    Allergies Father    CAD Father 23   Breast cancer Paternal Grandmother    Hypertension Other    Heart disease Other     Prior to Admission medications    Medication Sig Start Date End Date Taking? Authorizing Provider  acyclovir  (ZOVIRAX ) 400 MG tablet TAKE ONE TABLET BY MOUTH TWICE DAILY 06/23/24   Dorsey, John T IV, MD  albuterol  (PROVENTIL ) (2.5 MG/3ML) 0.083% nebulizer solution USE ONE VIAL IN NEBULIZER EVERY 6 HOURS AS NEEDED WHEEZING OR SHORTNESS OF BREATH Patient not taking: Reported on 07/30/2024 09/12/23   Geronimo Amel, MD  albuterol  (VENTOLIN  HFA) 108 (90 Base) MCG/ACT inhaler Inhale 2 puffs into the lungs every 6 hours as needed for wheezing or shortness of breath. 06/11/24   Federico Norleen ONEIDA MADISON, MD  ALPRAZolam  (XANAX ) 0.5 MG tablet Take 0.5 mg by mouth at bedtime.    [provider]  ASSESS FULL RANGE PEAK METER DEVI as directed. Patient not taking: Reported on 07/30/2024 07/15/11   [provider]  azelastine  (ASTELIN ) 0.1 % nasal spray Place 2 sprays into both nostrils 2 (two) times daily. Use in each nostril as directed 10/07/23   Soldatova, Liuba, MD  azithromycin  (ZITHROMAX ) 250 MG tablet 250mg  once daily on  Monday Wed Friday 07/30/24   Geronimo Amel, MD  bisoprolol  (ZEBETA ) 5 MG tablet Take 1 tablet (5 mg total) by mouth daily. 10/17/21   Geronimo Amel, MD  Budeson-Glycopyrrol-Formoterol (BREZTRI  AEROSPHERE) 160-9-4.8 MCG/ACT AERO Inhale 2 puffs into the lungs in the morning and at bedtime. 10/13/23   Parrett, Madelin RAMAN, NP  cetirizine  (ZYRTEC ) 10 MG tablet Take 1 tablet (10 mg total) by mouth daily. 10/07/23   Soldatova, Liuba, MD  cholecalciferol (VITAMIN D) 1000 UNITS tablet Take 3,000 Units by mouth daily.    [provider]  dextromethorphan (DELSYM) 30 MG/5ML liquid Take 30 mg by mouth as needed for cough.    [provider]  EPINEPHrine  (EPI-PEN) 0.3 mg/0.3 mL DEVI Inject 0.3 mg into the muscle as needed. Patient not taking: Reported on 07/30/2024    [provider]  ezetimibe -simvastatin  (VYTORIN ) 10-40 MG tablet Take 1 tablet by mouth daily. 07/27/24   Patwardhan, Newman PARAS, MD  fexofenadine (ALLEGRA) 180 MG tablet Take 180 mg by mouth daily. Patient not taking: Reported on 07/30/2024    [provider]  fluticasone  [facility]) 50 MCG/ACT nasal spray Place 1 spray into both nostrils daily as needed for allergies or rhinitis.    [provider]  furosemide  (LASIX ) 40 MG tablet Take 1 tablet (40 mg total) by mouth daily as needed. 06/10/24   Geronimo Amel, MD  HYDROcodone  bit-homatropine (HYCODAN) 5-1.5 MG/5ML syrup Take 5 mLs by mouth every 4 (four) hours as needed for cough. 07/11/24   Geronimo Amel, MD  ibuprofen (ADVIL) 200 MG tablet Take 200 mg by mouth every 6 (six) hours as needed for moderate pain (pain score 4-6).    [provider]  ketoconazole (NIZORAL) 2 % cream Apply 1 Application topically 2 (two) times daily as needed for irritation. 09/21/21   [provider]  levothyroxine  (SYNTHROID ) 50 MCG tablet Take 50 mcg by mouth every morning. 11/14/23   [provider]  montelukast  (SINGULAIR ) 10 MG tablet Take 10 mg by mouth at bedtime.    [provider]  Multiple Vitamin (MULTIVITAMIN) tablet Take 1 tablet by mouth daily.    [provider]  omeprazole  (PRILOSEC) 40 MG capsule Take 1 capsule (40 mg total) by mouth 2 (two) times daily. Take 30 minutes before a meal 05/11/24   Soldatova, Liuba, MD  OXYGEN Inhale 2 L into the lungs continuous.    [provider]  potassium chloride  SA (KLOR-CON  M20) 20 MEQ tablet Take 1 tablet (20 mEq total) by mouth daily. 06/10/24   Geronimo Amel, MD  predniSONE  (DELTASONE ) 10 MG tablet Take 1 tablet (10 mg total) by mouth daily with breakfast. Patient not taking: Reported on 07/30/2024 05/14/24   Geronimo Amel, MD  sildenafil (REVATIO) 20 MG tablet Take 20 mg by mouth daily as needed (ED). Patient not taking: Reported on 07/30/2024 07/12/19   [provider]  Simethicone  (SIMETHICONE  ULTRA STRENGTH) 180 MG CAPS Take 1 capsule (180 mg  total) by mouth 3 (three) times daily as needed. Patient not taking: Reported on 07/30/2024 01/06/22   Bero, Michael M, MD  sodium chloride  (OCEAN) 0.65 % SOLN nasal spray Place 1 spray into both nostrils as needed for congestion.    [provider]  tamsulosin  (FLOMAX ) 0.4 MG CAPS capsule Take 0.4 mg by mouth daily.    [provider]  thyroid  (ARMOUR) 90 MG tablet Take 90 mg by mouth daily. 06/23/24   [provider]  Treprostinil ([name]) 0.6 MG/ML SOLN Inhale into the lungs 4 (four) times daily. Inhale 3 breaths per treatment session four times daily. Increase weekly to goal dose 9 to 12 breaths per treatment session four times daily Patient not taking: Reported on 07/30/2024    [provider]  triamcinolone  cream (KENALOG ) 0.1 % Apply 1 application topically daily as needed (sun burn itch). 01/15/21   [provider]  Turmeric 400 MG CAPS Take 1 capsule by mouth daily.    [provider]    Physical Exam: Vitals:   08/09/24 1646 08/09/24 1653 08/09/24 1750 08/09/24 1827  BP:    (!) 158/96  Pulse: 67 65  69  Resp: 20 (!) 34  18  Temp:   98.8 F (37.1 C) 98.8 F (37.1 C)  TempSrc:   Oral   SpO2: 93% 100%  99%  Weight:       Constitutional: Chronically ill looking, in mild respiratory distress Eyes: PERRL, lids and conjunctivae normal ENMT: Mucous membranes are moist. Posterior pharynx clear of any exudate or lesions.Normal dentition.  Neck: normal, supple, no masses, no thyromegaly Respiratory: Decreased air entry bilaterally with coarse breath sound on diffuse crackles,.  Cardiovascular: Regular rate and rhythm, no murmurs / rubs / gallops. No extremity edema. 2+ pedal pulses. No carotid bruits.  Abdomen: no tenderness, no masses palpated. No hepatosplenomegaly. Bowel sounds positive.  Musculoskeletal: Good range of motion, no joint swelling or tenderness, Skin: no rashes, lesions, ulcers. No induration Neurologic: CN 2-12  grossly intact. Sensation intact, DTR normal. Strength 5/5 in all 4.  Psychiatric: Normal judgment and insight. Alert and oriented x 3.  Anxious mood  Data Reviewed:  Temperature 99, blood pressure 153/93, pulse 60 respiratory rate of 40 oxygen sats 74% on 6 L and 95% on 15 L nonrebreather backSodium 132, chloride 94, CBC within normal.  Acute  viral screen negative for influenza RSV and COVID-19, EKG showed normal sinus rhythm.  Chest x-ray showed continued findings of pulmonary fibrosis with low lung volumes.  There is increased ground glass opacity within the lungs which could reflect alveolitis or edema.  CT angiogram of the chest shows no evidence of PE.  There is bilateral lower lobe consolidation may be due to aspiration.  Small pericardial effusion and mild diffuse pulmonary parenchymal ground glass appearance.  There is underlying interstitial lung disease.  Assessment and Plan:  #1 acute on chronic hypoxic respiratory failure: Most likely due to worsening and advanced interstitial lung disease.  Pneumonia cannot be excluded.  Patient will be admitted.  IV steroid.  Initiate antibiotics.  Also consider pulmonary consult.  Continue nebulizer treatments.  Oxygen titration to his baseline.  #2 interstitial lung disease: Advanced.  Being followed by pulmonary.  Will defer to pulmonary.  #3 history of multiple myeloma: Defer to oncology outpatient.  #4 hyperlipidemia: Confirm and resume statin  #5 history of asthma: Could be having an asthma exacerbation as well.  Will continue with steroids antibiotics and supportive care.  #6 coronary artery disease: Appears to be compensated at baseline.  #7 anxiety disorder: Continue anxiolytics.    Advance Care Planning:   Code Status: Full Code   Consults: Possible pulmonary consult in the morning.  Follows up with Dr. Geronimo  Family Communication: Wife at bedside  Severity of Illness: The appropriate patient status for this patient is  INPATIENT. Inpatient status is judged to be reasonable and necessary in order to provide the required intensity of service to ensure the patient's safety. The patient's presenting symptoms, physical exam findings, and initial radiographic and laboratory data in the context of their chronic comorbidities is felt to place them at high risk for further clinical deterioration. Furthermore, it is not anticipated that the patient will be medically stable for discharge from the hospital within 2 midnights of admission.   * I certify that at the point of admission it is my clinical judgment that the patient will require inpatient hospital care spanning beyond 2 midnights from the point of admission due to high intensity of service, high risk for further deterioration and high frequency of surveillance required.*  AuthorBETHA SIM KNOLL, MD 08/09/2024 7:19 PM  For on call review [email or web address].

## 2024-08-09 NOTE — ED Notes (Signed)
 RT called to bedside to place patient on high-flow nasal cannula while on 6L nasal cannula.

## 2024-08-09 NOTE — ED Notes (Signed)
 Patient placed on non-re breather with RT at beside due to SpO2 75% on 15L nasal cannula. Patient SpO2 100 on non-re breather at 15L O2.

## 2024-08-10 ENCOUNTER — Telehealth: Payer: Self-pay | Admitting: *Deleted

## 2024-08-10 ENCOUNTER — Other Ambulatory Visit: Payer: Self-pay

## 2024-08-10 DIAGNOSIS — J9621 Acute and chronic respiratory failure with hypoxia: Secondary | ICD-10-CM | POA: Diagnosis not present

## 2024-08-10 DIAGNOSIS — Z7189 Other specified counseling: Secondary | ICD-10-CM | POA: Diagnosis not present

## 2024-08-10 DIAGNOSIS — Z515 Encounter for palliative care: Secondary | ICD-10-CM

## 2024-08-10 DIAGNOSIS — J849 Interstitial pulmonary disease, unspecified: Secondary | ICD-10-CM

## 2024-08-10 DIAGNOSIS — J690 Pneumonitis due to inhalation of food and vomit: Secondary | ICD-10-CM | POA: Diagnosis not present

## 2024-08-10 DIAGNOSIS — Z66 Do not resuscitate: Secondary | ICD-10-CM

## 2024-08-10 DIAGNOSIS — C9000 Multiple myeloma not having achieved remission: Secondary | ICD-10-CM

## 2024-08-10 LAB — HIV ANTIBODY (ROUTINE TESTING W REFLEX): HIV Screen 4th Generation wRfx: NONREACTIVE

## 2024-08-10 LAB — COMPREHENSIVE METABOLIC PANEL WITH GFR
ALT: 22 U/L (ref 0–44)
AST: 26 U/L (ref 15–41)
Albumin: 3.5 g/dL (ref 3.5–5.0)
Alkaline Phosphatase: 57 U/L (ref 38–126)
Anion gap: 6 (ref 5–15)
BUN: 15 mg/dL (ref 8–23)
CO2: 32 mmol/L (ref 22–32)
Calcium: 9 mg/dL (ref 8.9–10.3)
Chloride: 95 mmol/L — ABNORMAL LOW (ref 98–111)
Creatinine, Ser: 0.73 mg/dL (ref 0.61–1.24)
GFR, Estimated: >60 mL/min (ref >60)
Glucose, Bld: 86 mg/dL (ref 70–99)
Potassium: 4.5 mmol/L (ref 3.5–5.1)
Sodium: 133 mmol/L — ABNORMAL LOW (ref 135–145)
Total Bilirubin: 0.4 mg/dL (ref 0.0–1.2)
Total Protein: 7 g/dL (ref 6.5–8.1)

## 2024-08-10 LAB — CBC
HCT: 38.8 % — ABNORMAL LOW (ref 39.0–52.0)
Hemoglobin: 12.4 g/dL — ABNORMAL LOW (ref 13.0–17.0)
MCH: 30.4 pg (ref 26.0–34.0)
MCHC: 32 g/dL (ref 30.0–36.0)
MCV: 95.1 fL (ref 80.0–100.0)
Platelets: 219 K/uL (ref 150–400)
RBC: 4.08 MIL/uL — ABNORMAL LOW (ref 4.22–5.81)
RDW: 12.9 % (ref 11.5–15.5)
WBC: 8.3 K/uL (ref 4.0–10.5)
nRBC: 0 % (ref 0.0–0.2)

## 2024-08-10 LAB — PROCALCITONIN: Procalcitonin: <0.1 ng/mL

## 2024-08-10 MED ORDER — IPRATROPIUM-ALBUTEROL 0.5-2.5 (3) MG/3ML IN SOLN
3.0000 mL | Freq: Two times a day (BID) | RESPIRATORY_TRACT | Status: DC
  Administered 2024-08-11 – 2024-08-14 (×7): 3 mL via RESPIRATORY_TRACT
  Filled 2024-08-10 (×8): qty 3

## 2024-08-10 MED ORDER — SALINE SPRAY 0.65 % NA SOLN
1.0000 | NASAL | Status: DC | PRN
  Administered 2024-08-10 – 2024-08-14 (×3): 1 via NASAL
  Filled 2024-08-10: qty 44

## 2024-08-10 MED ORDER — METHYLPREDNISOLONE SODIUM SUCC 40 MG IJ SOLR
INTRAMUSCULAR | Status: AC
  Administered 2024-08-10: 40 mg via INTRAVENOUS
  Filled 2024-08-10: qty 1

## 2024-08-10 MED ORDER — ALPRAZOLAM 0.5 MG PO TABS
1.0000 mg | ORAL_TABLET | Freq: Three times a day (TID) | ORAL | Status: DC
  Administered 2024-08-10 – 2024-08-14 (×12): 1 mg via ORAL
  Filled 2024-08-10 (×12): qty 2

## 2024-08-10 MED ORDER — HYDROXYZINE HCL 25 MG PO TABS
25.0000 mg | ORAL_TABLET | Freq: Four times a day (QID) | ORAL | Status: DC | PRN
  Administered 2024-08-10 – 2024-08-14 (×11): 25 mg via ORAL
  Filled 2024-08-10 (×12): qty 1

## 2024-08-10 MED ORDER — METHYLPREDNISOLONE SODIUM SUCC 40 MG IJ SOLR
40.0000 mg | Freq: Three times a day (TID) | INTRAMUSCULAR | Status: DC
Start: 2024-08-10 — End: 2024-08-14
  Administered 2024-08-10 – 2024-08-14 (×11): 40 mg via INTRAVENOUS
  Filled 2024-08-10 (×12): qty 1

## 2024-08-10 MED ORDER — FUROSEMIDE 40 MG PO TABS: 40.0000 mg | ORAL_TABLET | Freq: Every day | ORAL | Status: DC

## 2024-08-10 MED ORDER — FUROSEMIDE 10 MG/ML IJ SOLN
40.0000 mg | Freq: Once | INTRAMUSCULAR | Status: AC
Start: 2024-08-10 — End: 2024-08-10
  Administered 2024-08-10: 40 mg via INTRAVENOUS
  Filled 2024-08-10: qty 4

## 2024-08-10 NOTE — Telephone Encounter (Signed)
 Ms. Samuel [name] called - Patient in ED right now, hypoxia, difficulty breathing. Waiting to be admitted.  He has lab appt scheduled here tomorrow. She asked if he could have the labs needed drawn while he is in hospital.  Dr. Federico informed - he states he will review chart and coordinate lab rests/draw with providers in ED/hospital  [name] to Ms. Cafarella - LVM with information provided in Dr. Lafonda response

## 2024-08-10 NOTE — Significant Event (Signed)
 Rapid Response Event Note   Reason for Call :  Increased work of breathing   Initial Focused Assessment:  Patient just admitted from emergency department. Patients work of breathing and O2 demand rapidly increased while ambulating from stretcher to bed. Bedside RN placed patient on NRB mask. Upon arrival patient on 10L NRB mask, this RN switched to Venturi mask to better titrate O2 demand, Venturi mask titrated down to 8L @ 40% Fio2 with Sp02 98%. Patient given xanax  PO for anxiety. Finally this RN switched patient to HFNC and titrated it down to 8L with patients Sp02 98%. Patients lung sounds were course crackles bilaterally, MD D/C fluids and ordered Lasix  40MG  PO. This RN also placed Purewick urine management system while patient is experiencing dyspnea upon exertion.     Event Summary:  Patient to be transferred to Progressive care unit for closer monitoring of oxygen status, if patient is to ambulate RN is encouraged to pre oxygenate patient. Patient does not have a good respiratory reserve and will take time to recover when Spo2 drops.  Feel free to reach out to rapid response with further concerns or questions    MD Notified: Landon Baller MD Call Time: 1300 Arrival Time: 1305 End Time:1355  Omega LILLETTE Settles, RN

## 2024-08-10 NOTE — Consult Note (Signed)
 NAME:  Samuel [name], MRN:  [id number], DOB:  18-Aug-1956, LOS: 1 ADMISSION DATE:  08/09/2024 CONSULTATION DATE:  08/10/2024 REFERRING MD:  Dibia - [facility], CHIEF COMPLAINT:  AoC RF, pulmonary fibrosis   History of Present Illness:  68 year old man who presented to [facility] ED 10/21 for SOB/respiratory distress. PMHx significant for HLD, acute-on-chronic respiratory failure in the setting of ILD/pulmonary fibrosis (followed by Dr. Geronimo), asthma, multiple myeloma, GERD, hypothyroidism, anxiety.  Patient presented to ED after spouse called Palliative Care office stating patient was having severe difficulty breathing (progressive over 3-day course) and cough; initially were requesting larger portable O2 tank/concentrator. Patient is on 6-8LNC at baseline with SpO2 mid-70s with exertion; O2 is increased to 15L for recovery. Also notes fatigue, lightheadedness and anxiousness. On ED arrival, patient was afebrile wit hHR 69, BP 158/96, RR 18, SpO2 99% on NRB. Labs were notable for WBC 9.7, Hgb 13.3, Plt 245; Na 132, K 4.7, CO2 30, Cr 0.61, LFTs WNL. COVID/Flu/RSV negative. CXR demonstrated continued pulmonary fibrosis with low lung volumes and increased GGOs in the lungs. CTA Chest PE protocol was negative for PE, +bilateral lower lobe consolidation, small pericardial effusion, mild diffuse pulmonary parenchymal ground glass, underlying ILD.  Admitted under [facility], PCCM consulted for pulmonary recommendations.  Samuel [name] states that he has had progressively worsening respiratory status since 11/2023. He is now requiring more baseline O2 both at rest and with exertion (6-8L at rest, 10-15L with exertion). It seems like it is taking him longer to recover after exerting himself. He has a frequent dry cough, nonproductive of sputum. Denies fevers/chills, CP, headache/dizziness, recent known sick contacts. Unfortunately has not tolerated anti-fibrotics well and has not tolerated pulmonary HTN medications, even in different  dosage forms. His anxiety surrounding his SOB/increased WOB and DOE is worsened as well. Per chart review, referred to [facility] for lung transplant evaluation but not a candidate due to multiple myeloma; for this reason, he is also unfortunately not a candidate for clinical trials.   Pertinent Medical History:   Past Medical History:  Diagnosis Date   Asthma    GERD (gastroesophageal reflux disease)    High cholesterol    under control.    History of blood transfusion    Hypothyroidism    Interstitial lung disease (HCC)    Multiple myeloma (HCC)    Perennial allergic rhinitis    Pneumonia    Sciatic pain, right    Seasonal allergic rhinitis    Thyroid  disease    Significant Hospital Events: Including procedures, antibiotic start and stop dates in addition to other pertinent events   10/20 - Presented to [facility] ED for progressive SOB x 3 days. Admitted via TRH. 10/21 - PCCM consulted for pulmonary recommendations.  Interim History / Subjective:  PCCM consulted for pulmonary recommendations.  Objective:  Blood pressure (!) 170/140, pulse 85, temperature 98.4 F (36.9 C), temperature source Oral, resp. rate (!) 28, weight 83 kg, SpO2 (!) 88%.        Intake/Output Summary (Last 24 hours) at 08/10/2024 1318 Last data filed at 08/10/2024 9271 Gross per 24 hour  Intake 792.39 ml  Output 200 ml  Net 592.39 ml   Filed Weights   08/09/24 1445  Weight: 83 kg   Physical Examination: General: Chronically ill-appearing older man in NAD. HEENT: /AT, anicteric sclera, PERRL, moist mucous membranes. Neuro: Awake, oriented x 4. Responds to verbal stimuli. Following commands consistently. Moves all 4 extremities spontaneously. Generalized weakness.  CV: RRR, no m/g/r. PULM: Breathing  shallow, tachypneic but minimally labored on 8L [place]. Lung fields with coarse crackles bilaterally, most notable at bases. GI: Soft, nontender, nondistended. Normoactive bowel sounds. Extremities: No  LE edema noted. Skin: Warm/dry, no rashes.  Resolved Hospital Problem List:    Assessment & Plan:  [name] is a 68 year old man who presented to [facility] ED 10/21 for SOB/respiratory distress. PMHx acute-on-chronic respiratory failure in the setting of ILD/pulmonary fibrosis (followed by Dr. Geronimo), asthma, MM. Unfortunately it sounds like Samuel [name] has very little reserve at this time with possible acute insult in addition to chronic ILD. Previously on Revatio, [name] for pulmonary HTN but unable to tolerate any administration forms due to side effects/bronchospasm. Has also not tolerated antifibrotics and is not a lung transplant or clinical trial candidate due to MM. He is optimistic that many of his symptoms are still reversible; however pulmonary fibrosis is a progressive process and we may have limited options. We will manage acutely at this time, accounting for possible infectious/edema/inflammatory etiologies as the drivers of his recent decline.  Acute-on-chronic respiratory failure on baseline O2 (6-8L) ILD, pulmonary fibrosis Pulmonary HTN, WHO group 3 - Supplemental O2 support to maintain SpO2 > 90% - BiPAP PRN - Bronchodilators (DuoNebs PRN) - Steroids 40mg  Q8H for now, on chronic prednisone  at home - Lasix  40mg  IV x 1, monitor I&Os - Morphine PRN for pain/air hunger - Anxiolytics (Xanax , Atarax) as needed - Pulmonary hygiene - F/u eRVP, PCT - CAP coverage with ceftriaxone/doxycycline - Follow CXR  PCCM will continue to follow with you.  Labs:  CBC: Recent Labs  Lab 08/09/24 1516 08/10/24 0622  WBC 9.7 8.3  NEUTROABS 8.4*  --   HGB 13.3 12.4*  HCT 40.8 38.8*  MCV 95.1 95.1  PLT 245 219   Basic Metabolic Panel: Recent Labs  Lab 08/09/24 1516 08/10/24 0622  NA 132* 133*  K 4.7 4.5  CL 94* 95*  CO2 30 32  GLUCOSE 99 86  BUN 17 15  CREATININE 0.61 0.73  CALCIUM  9.4 9.0   GFR: Estimated Creatinine Clearance: 91.3 mL/min (by C-G formula based on SCr of 0.73  mg/dL). Recent Labs  Lab 08/09/24 1516 08/10/24 0622  WBC 9.7 8.3   Liver Function Tests: Recent Labs  Lab 08/09/24 1516 08/10/24 0622  AST 34 26  ALT 27 22  ALKPHOS 68 57  BILITOT 0.4 0.4  PROT 7.9 7.0  ALBUMIN 4.0 3.5   No results for input(s): LIPASE, AMYLASE in the last 168 hours. No results for input(s): AMMONIA in the last 168 hours.  ABG:    Component Value Date/Time   PHART 7.445 06/15/2021 2045   PCO2ART 34.0 06/15/2021 2045   PO2ART 96.6 06/15/2021 2045   HCO3 31.0 (H) 01/23/2024 1020   HCO3 32.0 (H) 01/23/2024 1020   TCO2 33 (H) 01/23/2024 1020   TCO2 34 (H) 01/23/2024 1020   O2SAT 75 01/23/2024 1020   O2SAT 72 01/23/2024 1020    Coagulation Profile: No results for input(s): INR, PROTIME in the last 168 hours.  Cardiac Enzymes: No results for input(s): CKTOTAL, CKMB, CKMBINDEX, TROPONINI in the last 168 hours.  HbA1C: Hgb A1c MFr Bld  Date/Time Value Ref Range Status  09/27/2021 09:29 AM 6.2 4.6 - 6.5 % Final    Comment:    Glycemic Control Guidelines for People with Diabetes:Non Diabetic:  <6%Goal of Therapy: <7%Additional Action Suggested:  >8%   07/12/2021 09:54 AM 5.7 4.6 - 6.5 % Final    Comment:    Glycemic  Control Guidelines for People with Diabetes:Non Diabetic:  <6%Goal of Therapy: <7%Additional Action Suggested:  >8%    CBG: No results for input(s): GLUCAP in the last 168 hours.  Review of Systems:   Review of systems completed with pertinent positives/negatives outlined in above HPI.  Past Medical History:  He,  has a past medical history of Asthma, GERD (gastroesophageal reflux disease), High cholesterol, History of blood transfusion, Hypothyroidism, Interstitial lung disease (HCC), Multiple myeloma (HCC), Perennial allergic rhinitis, Pneumonia, Sciatic pain, right, Seasonal allergic rhinitis, and Thyroid  disease.   Surgical History:   Past Surgical History:  Procedure Laterality Date   BRONCHIAL BIOPSY   06/18/2021   Procedure: BRONCHIAL BIOPSIES;  Surgeon: Shelah Lamar RAMAN, MD;  Location: [facility];  Service: Cardiopulmonary;;   BRONCHIAL BIOPSY  08/28/2021   Procedure: BRONCHIAL BIOPSIES;  Surgeon: Brenna Adine CROME, DO;  Location: [facility];  Service: Cardiopulmonary;;   BRONCHIAL WASHINGS  06/18/2021   Procedure: BRONCHIAL WASHINGS;  Surgeon: Shelah Lamar RAMAN, MD;  Location: [facility];  Service: Cardiopulmonary;;   BRONCHIAL WASHINGS  08/28/2021   Procedure: BRONCHIAL WASHINGS;  Surgeon: Brenna Adine CROME, DO;  Location: [facility];  Service: Cardiopulmonary;;   NASAL SINUS SURGERY     NECK SURGERY  1996   RIGHT HEART CATH N/A 01/23/2024   Procedure: RIGHT HEART CATH;  Surgeon: Elmira Newman PARAS, MD;  Location: [facility]AS[facility];  Service: Cardiovascular;  Laterality: N/A;   VIDEO BRONCHOSCOPY N/A 06/18/2021   Procedure: VIDEO BRONCHOSCOPY WITH FLUORO;  Surgeon: Shelah Lamar RAMAN, MD;  Location: [facility];  Service: Cardiopulmonary;  Laterality: N/A;   VIDEO BRONCHOSCOPY N/A 08/28/2021   Procedure: VIDEO BRONCHOSCOPY WITH FLUORO;  Surgeon: Brenna Adine CROME, DO;  Location: [facility];  Service: Cardiopulmonary;  Laterality: N/A;    Social History:   reports that he quit smoking about 41 years ago. His smoking use included cigarettes. He started smoking about 44 years ago. He has a 0.3 pack-year smoking history. He has never used smokeless tobacco. He reports that he does not currently use alcohol. He reports that he does not use drugs.   Family History:  His family history includes Allergies in his father and mother; Breast cancer in his paternal grandmother; CAD (age of onset: 7) in his father; Heart disease in an other family member; Hypertension in his mother and another family member.   Allergies: Allergies  Allergen Reactions   Amlodipine  Swelling   Beclomethasone Other (See Comments)    unknown   Ciclesonide Other (See Comments)    unknown   Clarithromycin Other (See  Comments)    Hiccups   Ofev  [Nintedanib] Other (See Comments)    GI bleeding   Penicillins Other (See Comments)    Child hood unsure   Rosuvastatin      Other Reaction(s): hand swelling   Trazodone     Other Reaction(s): sweating   Olga [Treprostinil Sodium] Cough    Bronchial spasms, sore throat.   Home Medications: Prior to Admission medications   Medication Sig Start Date End Date Taking? Authorizing Provider  acyclovir  (ZOVIRAX ) 400 MG tablet TAKE ONE TABLET BY MOUTH TWICE DAILY 06/23/24  Yes Federico Norleen ONEIDA MADISON, MD  albuterol  (VENTOLIN  HFA) 108 (90 Base) MCG/ACT inhaler Inhale 2 puffs into the lungs every 6 hours as needed for wheezing or shortness of breath. 06/11/24  Yes Federico Norleen ONEIDA MADISON, MD  azelastine  (ASTELIN ) 0.1 % nasal spray Place 2 sprays into both nostrils 2 (two) times daily. Use in each  nostril as directed 10/07/23  Yes Soldatova, Elena, MD  azithromycin  (ZITHROMAX ) 250 MG tablet 250mg  once daily on  Monday Wed Friday 07/30/24  Yes Geronimo Amel, MD  bisoprolol  (ZEBETA ) 5 MG tablet Take 1 tablet (5 mg total) by mouth daily. 10/17/21  Yes Geronimo Amel, MD  Budeson-Glycopyrrol-Formoterol (BREZTRI  AEROSPHERE) 160-9-4.8 MCG/ACT AERO Inhale 2 puffs into the lungs in the morning and at bedtime. 10/13/23  Yes Parrett, Tammy S, NP  cetirizine  (ZYRTEC ) 10 MG tablet Take 1 tablet (10 mg total) by mouth daily. 10/07/23  Yes Soldatova, Liuba, MD  cholecalciferol (VITAMIN D) 1000 UNITS tablet Take 3,000 Units by mouth daily.   Yes [provider]  dextromethorphan (DELSYM) 30 MG/5ML liquid Take 30 mg by mouth as needed for cough.   Yes [provider]  fluticasone  (FLONASE) 50 MCG/ACT nasal spray Place 1 spray into both nostrils daily as needed for allergies or rhinitis.   Yes [provider]  furosemide  (LASIX ) 40 MG tablet Take 1 tablet (40 mg total) by mouth daily as needed. 06/10/24  Yes Geronimo Amel, MD  HYDROcodone  bit-homatropine (HYCODAN)  5-1.5 MG/5ML syrup Take 5 mLs by mouth every 4 (four) hours as needed for cough. 07/11/24  Yes Geronimo Amel, MD  ibuprofen (ADVIL) 200 MG tablet Take 200 mg by mouth every 6 (six) hours as needed for moderate pain (pain score 4-6).   Yes [provider]  levothyroxine  (SYNTHROID ) 50 MCG tablet Take 50 mcg by mouth every morning. 11/14/23  Yes [provider]  montelukast  (SINGULAIR ) 10 MG tablet Take 10 mg by mouth at bedtime.   Yes [provider]  Multiple Vitamin (MULTIVITAMIN) tablet Take 1 tablet by mouth daily.   Yes [provider]  omeprazole  (PRILOSEC) 40 MG capsule Take 1 capsule (40 mg total) by mouth 2 (two) times daily. Take 30 minutes before a meal 05/11/24  Yes Soldatova, Liuba, MD  OXYGEN Inhale 2 L into the lungs continuous.   Yes [provider]  potassium chloride  SA (KLOR-CON  M20) 20 MEQ tablet Take 1 tablet (20 mEq total) by mouth daily. 06/10/24  Yes Geronimo Amel, MD  sodium chloride  (OCEAN) 0.65 % SOLN nasal spray Place 1 spray into both nostrils as needed for congestion.   Yes [provider]  tamsulosin  (FLOMAX ) 0.4 MG CAPS capsule Take 0.4 mg by mouth daily.   Yes [provider]  thyroid  (ARMOUR) 90 MG tablet Take 90 mg by mouth daily. 06/23/24  Yes [provider]  triamcinolone  cream (KENALOG ) 0.1 % Apply 1 application topically daily as needed (sun burn itch). 01/15/21  Yes [provider]  Turmeric 400 MG CAPS Take 1 capsule by mouth daily.   Yes [provider]  albuterol  (PROVENTIL ) (2.5 MG/3ML) 0.083% nebulizer solution USE ONE VIAL IN NEBULIZER EVERY 6 HOURS AS NEEDED WHEEZING OR SHORTNESS OF BREATH Patient not taking: No sig reported 09/12/23   Geronimo Amel, MD  ALPRAZolam  (XANAX ) 0.5 MG tablet Take 0.5 mg by mouth at bedtime.    [provider]  ASSESS FULL RANGE PEAK METER DEVI as directed. Patient not taking: No sig reported 07/15/11   [provider]  EPINEPHrine  (EPI-PEN) 0.3 mg/0.3 mL DEVI Inject 0.3 mg into the muscle as needed. Patient not taking: No sig reported    [provider]  ezetimibe -simvastatin  (VYTORIN ) 10-40 MG tablet Take 1 tablet by mouth daily. 07/27/24   Patwardhan, Newman PARAS, MD  fexofenadine (ALLEGRA) 180 MG tablet Take 180 mg by mouth  daily. Patient not taking: No sig reported    [provider]  ketoconazole (NIZORAL) 2 % cream Apply 1 Application topically 2 (two) times daily as needed for irritation. Patient not taking: Reported on 08/10/2024 09/21/21   [provider]  predniSONE  (DELTASONE ) 10 MG tablet Take 1 tablet (10 mg total) by mouth daily with breakfast. Patient not taking: No sig reported 05/14/24   Geronimo Amel, MD  sildenafil (REVATIO) 20 MG tablet Take 20 mg by mouth daily as needed (ED). Patient not taking: No sig reported 07/12/19   [provider]  Simethicone  (SIMETHICONE  ULTRA STRENGTH) 180 MG CAPS Take 1 capsule (180 mg total) by mouth 3 (three) times daily as needed. Patient not taking: No sig reported 01/06/22   Theadore Ozell HERO, MD  Treprostinil ([name]) 0.6 MG/ML SOLN Inhale into the lungs 4 (four) times daily. Inhale 3 breaths per treatment session four times daily. Increase weekly to goal dose 9 to 12 breaths per treatment session four times daily Patient not taking: No sig reported    [provider]    Signature:   Corean HERO Oluwadarasimi Redmon, PA-C [facility] 08/10/24 1:18 PM  Please see [email or web address] for pager details.  From 7A-7P if no response, please call [phone] After hours, please call ELink [phone]

## 2024-08-10 NOTE — Progress Notes (Signed)
 Rapid response called at this time regarding respiratory distress.

## 2024-08-10 NOTE — Consult Note (Signed)
 Palliative Care Consult Note                                  Date: 08/10/2024   Patient Name: Samuel [name]  DOB: 1955/11/11  MRN: [id number]  Age / Sex: 68 y.o., male  PCP: Seabron Lenis, MD Referring Physician: Carlota Landon BRAVO, MD  Reason for Consultation: Establishing goals of care and Non pain symptom management  HPI/Patient Profile: Palliative Care consult requested for goals of care discussion in this 68 y.o. male  with past medical history of multiple myeloma with multiple lytic lesions to thoracolumbar spine and bilateral pelvis, extensive pulmonary fibrosis, GERD, hypothyroidism, HLD, and asthma. He was admitted on 08/09/2024 via EMS with worsening shortness of breath. Required 10 L via nasal cannula later due to distress escalated to high flow. Work-up suspicious for bilateral lower lobe aspiration and worsening interstitial disease. Influenza, COVID, and RSV negative.    Past Medical History:  Diagnosis Date   Asthma    GERD (gastroesophageal reflux disease)    High cholesterol    under control.    History of blood transfusion    Hypothyroidism    Interstitial lung disease (HCC)    Multiple myeloma (HCC)    Perennial allergic rhinitis    Pneumonia    Sciatic pain, right    Seasonal allergic rhinitis    Thyroid  disease      Subjective:   This NP Levon Freud reviewed medical records, received report from team, assessed the patient and then met at the patient's bedside with patient, his son, daughter, and wife  to discuss diagnosis, prognosis, GOC, EOL wishes disposition and options.   Mr. Saputo sitting upright in bed eating dinner. Dyspneic with minimal exertion causing rest breaks even with eating. He is able to participate appropriately in discussions however with observed fatigue.    Mr. Dimaano is familiar with myself and palliative support. I have been actively following him at the cancer center. Concept of  Palliative Care was re-introduced to family as specialized medical care for people and their families living with serious illness.  It focuses on providing relief from the symptoms and stress of a serious illness.  The goal is to improve quality of life for both the patient and the family. Values and goals of care important to patient and family were attempted to be elicited.  I created space and opportunity for patient and family to explore state of health prior to admission, thoughts, and feelings.   Mr. Sheehan and family are realistic in their understanding. They are emotional expressing his decline in quality of life over the past year and more extensively over the past months. Emotional support provided. He shares he is not able to ambulate to the bathroom since admission due to extensive oxygen demand.He is experiencing anxiousness due to respiratory state, being alone, and not being at home. States he is warm during the visit. Thermostat adjusted and nurse to get a bedside fan to use as needed.   We discussed His current illness and what it means in the larger context of His on-going co-morbidities. Natural disease trajectory and expectations were discussed.  Patient is able to speak to his understanding of worsening lung disease that will ultimate lead to end-of-life. He expresses concerns if current pneumonia is contributing to worsening symptoms versus the actual disease process. We discussed at length verbalized trajectory and any respiratory condition will only worsen  his respiratory status causing more difficulty to show improvement. He and family verbalized understanding however would like to continue antibiotics over the next 24-48 hours to allow time for outcomes. We discussed at length medications available for symptom support.   I empathetically approached discussions regarding code status, healthcare limitations, and worries that his quality of life is not improving and complex decisions  may be needed sooner rather than later due to high risk of sudden decompensation. Family emotional however expressing their understandings.   Signe is emotional expressing he has no quality of life, does not desire to be in a suffering state, recognizing I can't keep living like this! He emotionally states he is ready for God to take him but only in his timing and if it is not quite time requesting not to be left here in a suffering state. Emotional support provided. Daughter emotionally shares her desires not to witness her father continued suffering and her full support of his decisions as she wishes for him to be comfortable. Son and wife also verbalized similar agreement.   Questions and concerns were addressed. The patient and family was encouraged to call with questions or concerns.  PMT will continue to support holistically as needed.  Objective:   Primary Diagnoses: Present on Admission:  Acute on chronic respiratory failure (HCC)  Mild persistent asthma  Monoclonal gammopathy  Multiple myeloma (HCC)  Interstitial pulmonary disease (HCC)  Anxiety  ILD (interstitial lung disease) (HCC)  Mixed hyperlipidemia  Essential hypertension   Scheduled Meds:  ALPRAZolam   1 mg Oral Q8H   enoxaparin  (LOVENOX ) injection  40 mg Subcutaneous Q24H   ipratropium-albuterol   3 mL Nebulization Q6H   levothyroxine   50 mcg Oral Q0600   methylPREDNISolone  (SOLU-MEDROL ) injection  40 mg Intravenous Q8H   pantoprazole   40 mg Oral BID   tamsulosin   0.4 mg Oral Daily    Continuous Infusions:  cefTRIAXone (ROCEPHIN)  IV Stopped (08/09/24 2030)   doxycycline (VIBRAMYCIN) IV 100 mg (08/10/24 0733)    PRN Meds: acetaminophen  **OR** acetaminophen , albuterol , hydrALAZINE , hydrOXYzine, morphine injection, ondansetron  **OR** ondansetron  (ZOFRAN ) IV, sodium chloride   Allergies  Allergen Reactions   Amlodipine  Swelling   Beclomethasone Other (See Comments)    unknown   Ciclesonide Other (See Comments)     unknown   Clarithromycin Other (See Comments)    Hiccups   Ofev  [Nintedanib] Other (See Comments)    GI bleeding   Penicillins Other (See Comments)    Child hood unsure   Rosuvastatin      Other Reaction(s): hand swelling   Trazodone     Other Reaction(s): sweating   Olga [Treprostinil Sodium] Cough    Bronchial spasms, sore throat.    Review of Systems  Constitutional:  Positive for fatigue.  Respiratory:  Positive for shortness of breath.   Psychiatric/Behavioral:  The patient is nervous/anxious.   Unless otherwise noted, a complete review of systems is negative.  Physical Exam General: NAD, frail chronically-ill appearing Cardiovascular: regular rate and rhythm Pulmonary:tachypneic, diminished bilaterally, dyspneic  Abdomen: soft, nontender, + bowel sounds Extremities: no edema, no joint deformities Skin: no rashes, warm and dry Neurological: Alert and oriented x3  Vital Signs:  BP (!) 153/98 (BP Location: Left Arm)   Pulse 74   Temp 98.1 F (36.7 C) (Oral)   Resp (!) 32   Wt 83 kg Comment: Bed scale  SpO2 99%   BMI 26.26 kg/m  Pain Scale: 0-10   Pain Score: 0-No pain  SpO2: SpO2: 99 % O2  Device:SpO2: 99 % O2 Flow Rate: .O2 Flow Rate (L/min): 8 L/min  IO: Intake/output summary:  Intake/Output Summary (Last 24 hours) at 08/10/2024 1926 Last data filed at 08/10/2024 1545 Gross per 24 hour  Intake 792.39 ml  Output 1100 ml  Net -307.61 ml    LBM: Last BM Date : 08/09/24 Baseline Weight: Weight: 83 kg (Bed scale) Most recent weight: Weight: 83 kg (Bed scale)      Palliative Assessment/Data: PPS 20%   Advanced Care Planning:   Primary Decision Maker: PATIENT  Code Status/Advance Care Planning: DNR  I discussed his full code status recommended DNR/DNI with understanding in an cardiopulmonary event patient's chances of survival are minimal given poor lung function. Mr. Schedler and family has clearly expressed wishes for no life-sustaining  measures. Education provided on what DNR/DNI would look like and they again confirmed wishes for DNR/DNI mutually.   Education provided on MOST form. Wife has form from recent visit with Dr. Geronimo. We discussed at length and all questions. He identifies wishes for DNR, antibiotics as needed, no IVF, and no artificial feedings however struggles with options of limited interventions vs. Comfort on paper. He has been clear his wishes are not to be in the hospital. Family would like some time to discuss amongst themselves requesting follow-up tomorrow and completion.   His wishes are clearly expressed to be at home for what time he has left. Hospice and Palliative Care services outpatient were explained and offered. Patient and family verbalized their understanding and awareness of both palliative and hospice's goals and philosophy of care.   We discussed extensively outpatient hospice support. Education provided on what care will look like in the home including focus of no rehospitalization and allowing hospice to manage all symptoms best possible in the home, minimizing medications, no further extensive work-ups or scans, patient would not require in-person medical visits outpatient however medical team is available to still offer support with collaboration with hospice. They verbalized understanding also expressing intentions based on final plans for additional home care support. They would like to discuss as a family tonight before making any final decisions. Patient states he is hopeful to return home in the next 48 hours.   I discussed the importance of continued conversation with family and their medical providers regarding overall plan of care and treatment options, ensuring decisions are within the context of the patients values and GOCs.  Assessment & Plan:   SUMMARY OF RECOMMENDATIONS   DNR/DNI-as confirmed by patient and family.  Education provided on MOST form. Patient and family would  like to discuss further overnight and request completion tomorrow during follow-up. Document complete without signatures as they are challenged with comfort care versus limited interventions. See discussions above.  Continue with current plan of care treating the treatable. Wishes to continue antibiotics while hospitalized in addition to other interventions to allow time for outcomes of stability.  Extensive goals of care discussions. Patient and family are realistic. Leaning towards home with hospice in the next several days. His desire is to be in the home amongst family and friends and not in a facility.  PMT will continue to support and follow as needed. Please call team line or secure chat with urgent needs.  Symptom Management:  Pain  Morphine 2mg  every 2 hours as needed Dyspnea Solu-Medrol  40mg  every 8 hours Morphine 2mg  every 2 hours as needed Proventil  every 2 hours as needed Continue use of oxygen for comfort and support Nausea Zofran  4mg  every 6  hours as needed Anxiety Xanax  1mg  every 8 hours  Hydroxyzine 25mg  every 6 hours as needed   Palliative Prophylaxis:  Aspiration, Frequent Pain Assessment, Oral Care, and Turn Reposition  Additional Recommendations (Limitations, Scope, Preferences): DNR/DNI, Treat the treatable allowing stability  Psycho-social/Spiritual:  Desire for further Chaplaincy support: no Additional Recommendations: Education on Hospice  Prognosis:  Guarded-Poor weeks-months   Discharge Planning:  To Be Determined pending hospital trajectory and family decisions. Recommend outpatient hospice at minimum.   Discussed with: Patient, family, RN.  Patient and family (wife, son, and daughter) expressed understanding and was in agreement with this plan.   I personally spent a total of 90 minutes in the care of the patient today including preparing to see the patient, getting/reviewing separately obtained history, performing a medically appropriate  exam/evaluation, counseling and educating, placing orders, referring and communicating with other health care professionals, documenting clinical information in the EHR, independently interpreting results, communicating results, and coordinating care. Visit consisted of counseling and education dealing with the complex and emotionally intense issues of symptom management and palliative care in the setting of serious and potentially life-threatening illness.  Signed by:  Levon Borer, AGPCNP-BC [facility]   Phone: [phone] Pager: [phone] Amion: GEANNIE Freud   Thank you for allowing the Palliative Medicine Team to assist in the care of this patient. Please utilize secure chat with additional questions, if there is no response within 30 minutes please call the above phone number. Palliative Medicine Team providers are available by phone from 7am to 5pm daily and can be reached through the team cell phone.  Should this patient require assistance outside of these hours, please call the patient's attending physician.  *Please note that this is a verbal dictation therefore any spelling or grammatical errors are due to the [employer] system interpretation.

## 2024-08-10 NOTE — ED Notes (Signed)
 Patient had an episode of persistent O2 sat in the low 80's after drinking something cold which he states he only drinks room temp water - thus making him cough quite a bit. RN able to get him back up to 100% after a few minutes of assistance with a nonrebreather. Patient currently on 6L HFNC. MD notified.

## 2024-08-10 NOTE — ED Notes (Signed)
 RN explained CT results to patient pertaining to concern for aspiration and worsening of such if patient continues to drink/eat prior to evaluation from speech therapy. Patient agreeable to this, however patient is endorsing worsening anxiety at this time. MD contacted for further anxiety medication.

## 2024-08-10 NOTE — Progress Notes (Signed)
 Progress Note   Patient: Samuel [name] FMW:[id number] DOB: 23-May-1956 DOA: 08/09/2024     1 DOS: the patient was seen and examined on 08/10/2024   Brief hospital course: Samuel [name] is a 68 y.o. male with medical history significant of interstitial lung disease which is advanced being followed by pulmonology, hypothyroidism, GERD, asthma, multiple myeloma, who presented to the ER with progressive worsening of shortness of breath over the last 3 days.  He normally on 6 L of oxygen at baseline.  On arrival patient was requiring up to 10 L of oxygen.  Also associated with some anxiety.  Patient is weak.   Per patient and wife this is worse from his baseline.   Assessment and Plan: #1 acute on chronic hypoxic respiratory failure: Most likely due to worsening and advanced interstitial lung disease.  CT reviewed-Concern for Aspiration Pneumonia Continue IV steroid 40mg  q8, antibiotics, DC fluids   pulmonary consulted.   Continue nebulizer treatments.  Oxygen titration to his baseline.   #2 interstitial lung disease: Advanced.  Being followed by pulmonary.  Will defer to pulmonary.   #3 history of multiple myeloma: Defer to oncology outpatient.   #4 hyperlipidemia: Confirm and resume statin   #5 history of asthma: Could be having an asthma exacerbation as well.  Will continue with steroids antibiotics and supportive care.   #6 coronary artery disease: Appears to be compensated at baseline.   #7 anxiety disorder: Continue anxiolytics.      Advance Care Planning:   Code Status: Full Code    Consults: Possible pulmonary consult in the morning.  Follows up with Dr. Geronimo   Family Communication: Wife at bedside       Subjective: Patient on 8liters desats with coughing and drinking cold fluids   Physical Exam:  Constitutional: Chronically ill looking, in mild respiratory distress Eyes: PERRL, lids and conjunctivae normal ENMT: Mucous membranes are moist. Posterior pharynx  clear of any exudate or lesions.Normal dentition.  Neck: normal, supple, no masses, no thyromegaly Respiratory: Bibasilar crackles, on 8L  Cardiovascular: Regular rate and rhythm, no murmurs / rubs / gallops. No extremity edema. 2+ pedal pulses. No carotid bruits.  Abdomen: no tenderness, no masses palpated. No hepatosplenomegaly. Bowel sounds positive.  Musculoskeletal: Good range of motion, no joint swelling or tenderness, Skin: no rashes, lesions, ulcers. No induration Neurologic: CN 2-12 grossly intact. Sensation intact, DTR normal. Strength 5/5 in all 4.  Psychiatric: Normal judgment and insight. Alert and oriented x 3.  Anxious mood Vitals:   08/10/24 0900 08/10/24 0949 08/10/24 1100 08/10/24 1259  BP: (!) 148/83  139/80 (!) 170/140  Pulse: 69  63 85  Resp: (!) 28  (!) 28   Temp:  97.9 F (36.6 C)  98.4 F (36.9 C)  TempSrc:  Oral  Oral  SpO2: 97%  100% (!) 88%  Weight:        Data Reviewed:  CBC    Component Value Date/Time   WBC 8.3 08/10/2024 0622   RBC 4.08 (L) 08/10/2024 0622   HGB 12.4 (L) 08/10/2024 0622   HGB 14.1 07/14/2024 0822   HCT 38.8 (L) 08/10/2024 0622   PLT 219 08/10/2024 0622   PLT 184 07/14/2024 0822   MCV 95.1 08/10/2024 0622   MCH 30.4 08/10/2024 0622   MCHC 32.0 08/10/2024 0622   RDW 12.9 08/10/2024 0622   LYMPHSABS 0.8 08/09/2024 1516   MONOABS 0.4 08/09/2024 1516   EOSABS 0.1 08/09/2024 1516   BASOSABS 0.0 08/09/2024 1516  CMP     Component Value Date/Time   NA 133 (L) 08/10/2024 0622   K 4.5 08/10/2024 0622   CL 95 (L) 08/10/2024 0622   CO2 32 08/10/2024 0622   GLUCOSE 86 08/10/2024 0622   BUN 15 08/10/2024 0622   CREATININE 0.73 08/10/2024 0622   CREATININE 0.81 07/14/2024 0822   CALCIUM  9.0 08/10/2024 0622   PROT 7.0 08/10/2024 0622   ALBUMIN 3.5 08/10/2024 0622   AST 26 08/10/2024 0622   AST 25 07/14/2024 0822   ALT 22 08/10/2024 0622   ALT 20 07/14/2024 0822   ALKPHOS 57 08/10/2024 0622   BILITOT 0.4 08/10/2024 0622    BILITOT 0.4 07/14/2024 0822   GFR 89.83 09/27/2021 0929   GFRNONAA >60 08/10/2024 0622   GFRNONAA >60 07/14/2024 0822     Family Communication: Wife at bedside  Disposition: Status is: Inpatient Remains inpatient appropriate because: On going hypoxia  Planned Discharge Destination: Home with Home Health    Time spent: 24 minutes  Author: Landon FORBES Baller, MD 08/10/2024 2:06 PM  For on call review [email or web address].

## 2024-08-10 NOTE — Evaluation (Signed)
 SLP Cancellation Note  Patient Details Name: ALEXEY RHOADS MRN: [id number] DOB: 02-13-1956   Cancelled treatment:       Reason Eval/Treat Not Completed: Other (comment) (pt seen by Rapid Response or increased WOB and continued desaturations; will continue efforts)  Madelin POUR, MS [facility] SLP Acute Rehab Services Office [phone]  Nicolas Emmie Caldron 08/10/2024, 4:23 PM

## 2024-08-10 NOTE — ED Notes (Signed)
 Pt was notified of MD order to be NPO until speech came down to assess. He respectfully declined NPO. Pt was informed the reason for NPO was due to risk of aspiration and possible pneumonia, pt still declined.

## 2024-08-10 NOTE — ED Notes (Signed)
 Patient transported to unit and ambulated 5 feet to stretcher. Patient became SOB and hypoxic in the mid 80's. Patient then became anxious and persisted into 74%. This RN requested Charge RN to bedside to convey concerns for patient's level of care. MD notified at this time.

## 2024-08-11 ENCOUNTER — Inpatient Hospital Stay

## 2024-08-11 ENCOUNTER — Telehealth: Payer: Self-pay

## 2024-08-11 ENCOUNTER — Encounter (HOSPITAL_COMMUNITY): Payer: Self-pay | Admitting: Internal Medicine

## 2024-08-11 DIAGNOSIS — Z7189 Other specified counseling: Secondary | ICD-10-CM | POA: Diagnosis not present

## 2024-08-11 DIAGNOSIS — J9621 Acute and chronic respiratory failure with hypoxia: Secondary | ICD-10-CM | POA: Diagnosis not present

## 2024-08-11 DIAGNOSIS — Z515 Encounter for palliative care: Secondary | ICD-10-CM | POA: Diagnosis not present

## 2024-08-11 DIAGNOSIS — F419 Anxiety disorder, unspecified: Secondary | ICD-10-CM | POA: Diagnosis not present

## 2024-08-11 LAB — RESPIRATORY PANEL BY PCR

## 2024-08-11 LAB — PRO BRAIN NATRIURETIC PEPTIDE: Pro Brain Natriuretic Peptide: 181 pg/mL (ref <300.0)

## 2024-08-11 MED ORDER — THYROID 60 MG PO TABS
90.0000 mg | ORAL_TABLET | Freq: Every day | ORAL | Status: DC
  Administered 2024-08-12 – 2024-08-14 (×3): 90 mg via ORAL
  Filled 2024-08-11 (×4): qty 1

## 2024-08-11 MED ORDER — FUROSEMIDE 10 MG/ML IJ SOLN
40.0000 mg | Freq: Four times a day (QID) | INTRAMUSCULAR | Status: AC
  Administered 2024-08-11 (×2): 40 mg via INTRAVENOUS
  Filled 2024-08-11 (×2): qty 4

## 2024-08-11 MED ORDER — BISOPROLOL FUMARATE 5 MG PO TABS
5.0000 mg | ORAL_TABLET | Freq: Every day | ORAL | Status: DC
  Administered 2024-08-11 – 2024-08-14 (×4): 5 mg via ORAL
  Filled 2024-08-11 (×4): qty 1

## 2024-08-11 MED ORDER — MONTELUKAST SODIUM 10 MG PO TABS
10.0000 mg | ORAL_TABLET | Freq: Every day | ORAL | Status: DC
  Administered 2024-08-11 – 2024-08-13 (×3): 10 mg via ORAL
  Filled 2024-08-11 (×3): qty 1

## 2024-08-11 MED ORDER — AZELASTINE HCL 0.1 % NA SOLN
2.0000 | Freq: Two times a day (BID) | NASAL | Status: DC
  Administered 2024-08-11 – 2024-08-14 (×7): 2 via NASAL
  Filled 2024-08-11: qty 30

## 2024-08-11 MED ORDER — FLUTICASONE PROPIONATE 50 MCG/ACT NA SUSP
1.0000 | Freq: Two times a day (BID) | NASAL | Status: DC
  Administered 2024-08-11 – 2024-08-14 (×7): 1 via NASAL
  Filled 2024-08-11: qty 16

## 2024-08-11 NOTE — Progress Notes (Signed)
 Daily Progress Note   Patient Name: HEMAN QUE       Date: 08/11/2024 DOB: Feb 27, 1956  Age: 68 y.o. MRN#: [id number] Attending Physician: Carlota Landon BRAVO, MD Primary Care Physician: Seabron Lenis, MD Admit Date: 08/09/2024  Reason for Consultation/Follow-up: Establishing goals of care, Non pain symptom management, and Pain control  Subjective: Chart Reviewed. Updates Received. Patient Assessed. Mr. Vanderwerf is awake and alert in bed. Noticeable dyspnea with minimal exertion and during conversation. His family is present at bedside, wife and 3 daughters.   We reviewed goals of care discussions from yesterday evening. Family had appropriate questions. Updates provided and all questions answered. We discussed what outpatient hospice would look like specifically regarding symptom management and keeping patient comfortable. Signe is quite anxious and fearful of suffering, signs of distress, specifically identifying what care will look like in the home when he feels as though he is suffocating. Education provided on use of aggressive therapies and medication. Continued use of oxygen and possible use of face mask for comfort however main goal with be use of medication to allow minimization of distress and symptoms. Patient states at that point he wishes to be asleep and allowed to rest. Emotional support provided during discussions.   We discussed MOST form again at length. They are clear on antibiotics as indicated, no IVF, and no feeding tube however still with some reserve regarding comfort versus limited interventions. After extensive discussions and reviewing scenarios Mr. Shima is clear he would not wish to return to the hospital unless absolutely necessary to as his desire is to pass away in his home. He has no desire to sit in the emergency room for hours knowing similar care can be provided in the home.   We discussed that by accepting hospice he is still able to perform any activities that  he feels strong enough to do. 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 I advised patient if he feels up to working and has the strength hospice does not cancel out what is most important to him but to allow comfort and support to improve his quality of life for what time he has left allowing normal routine as indicated.   Mr.Ramseyer and family are clear in their wishes to focus on his comfort upon returning home and allowing him every opportunity to enjoy life as best possible. We discussed potential equipment in the home to promote this such as hospital bed to allow head elevation, use of bedpan, and purewick versus catheter given patient is extremely weak and oxygen demand is overwhelming limiting his ability to get out of bed.   Patient speaks to his muscle weakness and fatigue. We discussed performing passive range of motion in bed to allow some strengthening as he can tolerate.   Signe becomes emotional expressing his anxiousness and fears of the unknowns, what-ifs, and worst case scenarios. He states he is at peace with his decision expressing wishes of returning home with hospice knowing all care has been maximized but also acknowledging his depression of overall health state, concerns for his family during this time and feeling as though I just gave up! Emotional support provided. I assured patient that although these are complex decisions and life changing he is not giving up but reshifting his focus on a different fight. He is now fighting for comfort and a quality of life managing symptoms, taking things one day at a time, and recognizing some days will be better than others, living in  the moment and taking the days of strength and making memories doing what brings him pleasure amongst those he love most. Patient and family emotional expressing appreciation. Spiritual support and counseling offered while hospitalized for support in addition outpatient at discharge. He verbalized  appreciation and would like this to happen as soon as possible due to his anxiousness and fears.   He would like to proceed with hospice referral to begin discussions with their team, gain understanding of home needs, and care at discharge. Patient will need home transportation and hospital bed for support. He would like to continue to treat the treatable while hospitalized continuing with IV antibiotics over the next 24-48 hours as family begin home preparations.   They would like to plan follow-up for tomorrow.    Length of Stay: 2 days  Vital Signs: BP 134/81 (BP Location: Right Arm)   Pulse 92   Temp 98.4 F (36.9 C) (Oral)   Resp 20   Wt 83 kg Comment: Bed scale  SpO2 98%   BMI 26.26 kg/m  SpO2: SpO2: 98 % O2 Device: O2 Device: High Flow Nasal Cannula O2 Flow Rate: O2 Flow Rate (L/min): 8 L/min Last Weight  Most recent update: 08/09/2024  2:45 PM    Weight  83 kg (183 lb)             Intake/Output Summary (Last 24 hours) at 08/11/2024 1429 Last data filed at 08/11/2024 1217 Gross per 24 hour  Intake 350 ml  Output 1900 ml  Net -1550 ml    Physical Exam: Gen:  Chronically-ill appearing  HEENT: moist mucous membranes CV: Tachycardia  PULM: Diminished, dyspneic with minimal exertion, oxygen via nasal cannula ABD: soft/nontender/nondistended/normal bowel sounds EXT: No edema Neuro: Alert and oriented x3  Palliative Care Assessment & Plan  YEP:Ejoopjupcz Care consult requested for goals of care discussion in this 68 y.o. male  with past medical history of multiple myeloma with multiple lytic lesions to thoracolumbar spine and bilateral pelvis, extensive pulmonary fibrosis, GERD, hypothyroidism, HLD, and asthma. He was admitted on 08/09/2024 via EMS with worsening shortness of breath. Required 10 L via nasal cannula later due to distress escalated to high flow. Work-up suspicious for bilateral lower lobe aspiration and worsening interstitial disease. Influenza, COVID,  and RSV negative.   Code Status: DNR  Recommendations/Plan: DNR/DNI Continue with current plan of care per medical team. Continue IV antibiotics over next 24-48 hours.  Extensive goals of care discussions. See above MOST: DNR, Considering comfort, no antibiotics, no artificial feeding tubes.  TOC referral for outpatient hospice. Patient will require home transportation, hospital bed, and urinary drainage system.  STAT spiritual care referral and counseling. Will discuss with cancer center counseling team.  PMT will continue to support and follow. Please secure chat for urgent unmet palliative needs.  Symptom Management: Pain  Morphine 2mg  every 2 hours as needed Dyspnea Solu-Medrol  40mg  every 8 hours Morphine 2mg  every 2 hours as needed Proventil  every 2 hours as needed Continue use of oxygen for comfort and support Nausea Zofran  4mg  every 6 hours as needed Anxiety Xanax  1mg  every 8 hours  Hydroxyzine 25mg  every 6 hours as needed   Thank you for allowing the Palliative Medicine Team to assist in the care of this patient.  Palliative Medicine Team providers are available by phone from 7am to 7pm daily and can be reached through the team cell phone. Should this patient require assistance outside of these hours, please call the patient's attending physician.  Any  controlled substances utilized were prescribed in the context of palliative care. PDMP has been reviewed.  I personally spent a total of 65 minutes in the care of the patient today including preparing to see the patient, getting/reviewing separately obtained history, performing a medically appropriate exam/evaluation, counseling and educating, placing orders, referring and communicating with other health care professionals, documenting clinical information in the EHR, independently interpreting results, communicating results, and coordinating care. Visit consisted of counseling and education dealing with the complex and  emotionally intense issues of symptom management and palliative care in the setting of serious and potentially life-threatening illness.  Levon Borer, AGPCNP-BC  Palliative Medicine TeamWL [facility]  [phone]  *Please note that this is a verbal dictation therefore any spelling or grammatical errors are due to the Dragon [employer] One system interpretation.

## 2024-08-11 NOTE — Evaluation (Signed)
 Clinical/Bedside Swallow Evaluation Patient Details  Name: Samuel [name] MRN: [id number] Date of Birth: May 10, 1956  Today's Date: 08/11/2024 Time: SLP Start Time (ACUTE ONLY): 0901 SLP Stop Time (ACUTE ONLY): 0931 SLP Time Calculation (min) (ACUTE ONLY): 30 min  Past Medical History:  Past Medical History:  Diagnosis Date   Asthma    GERD (gastroesophageal reflux disease)    High cholesterol    under control.    History of blood transfusion    Hypothyroidism    Interstitial lung disease (HCC)    Multiple myeloma (HCC)    Perennial allergic rhinitis    Pneumonia    Sciatic pain, right    Seasonal allergic rhinitis    Thyroid  disease    Past Surgical History:  Past Surgical History:  Procedure Laterality Date   BRONCHIAL BIOPSY  06/18/2021   Procedure: BRONCHIAL BIOPSIES;  Surgeon: Shelah Lamar RAMAN, MD;  Location: [facility];  Service: Cardiopulmonary;;   BRONCHIAL BIOPSY  08/28/2021   Procedure: BRONCHIAL BIOPSIES;  Surgeon: Brenna Adine CROME, DO;  Location: [facility];  Service: Cardiopulmonary;;   BRONCHIAL WASHINGS  06/18/2021   Procedure: BRONCHIAL WASHINGS;  Surgeon: Shelah Lamar RAMAN, MD;  Location: [facility];  Service: Cardiopulmonary;;   BRONCHIAL WASHINGS  08/28/2021   Procedure: BRONCHIAL WASHINGS;  Surgeon: Brenna Adine CROME, DO;  Location: [facility];  Service: Cardiopulmonary;;   NASAL SINUS SURGERY     NECK SURGERY  1996   RIGHT HEART CATH N/A 01/23/2024   Procedure: RIGHT HEART CATH;  Surgeon: Elmira Newman JINNY, MD;  Location: [facility];  Service: Cardiovascular;  Laterality: N/A;   VIDEO BRONCHOSCOPY N/A 06/18/2021   Procedure: VIDEO BRONCHOSCOPY WITH FLUORO;  Surgeon: Shelah Lamar RAMAN, MD;  Location: [facility];  Service: Cardiopulmonary;  Laterality: N/A;   VIDEO BRONCHOSCOPY N/A 08/28/2021   Procedure: VIDEO BRONCHOSCOPY WITH FLUORO;  Surgeon: Brenna Adine CROME, DO;  Location: [facility];  Service: Cardiopulmonary;  Laterality: N/A;   HPI:   68 yo male adm to [facility] with worsening dyspnea. Wife reports pt needing more oxygen.  CT chest concerning for aspiration as it showed bilateral lower lobe consolidation.  Pt with PMH + for GERD, multiple myeloma, COPD, sinusitis, ILD.  CT chest showed Diffuse pulmonary parenchymal ground-glass  superimposed on interstitial and subpleural pulmonary fibrosis,  better evaluated on CT chest 11/25/2022. Per note from palliative care NP, pt has been less mobile, has poor appetite, prefers easier to chew foods and was not consuming protein shakes and he has issues with constipation - for which he uses Miralax daily.   He is a full code.  Pt is afebrile, with RR up to 28.  Pt was on aheart healthy diet but he was subsequently made NPO due to concern for aspiration.    Assessment / Plan / Recommendation  Clinical Impression  Patient awake in bed, dyspneic but just finished breakfast.  CN exam unremarkable.  He was observed taking his pill with liquids - subtle delayed cough noted but pt denied dysphagia.  Other boluses of water were tolerated without coughing. He does report occasionally sensing pills lodging in lower throat - Based on his prior testing, suspect may be in distal esophagus with referrant sensation to pharynx.   SLP reviewed prior MBS study with pt *reviewing fluoroscopy loops* and esophagram results showing dysmotility.  Pt is on a PPI BID and takes Gas X reportedly after every meal.  He reports he coughs with cold liquids thus consumes room temperature drinks.  Denies coughing with his breakfast. Suspect his largest aspiration risk is his known dysmotility and oropharyngeal risk is more related to dyspnea - potential impairment of swallow/respiratory reciprocity.  Advised pt to start intake with liquids due to xerostomia and take rest breaks if dyspneic.  Provided him with esophageal dysmotility strategies/compensations in writing. he requested SLP attempt to return later today to educate his family.   Will follow up per his request. SLP Visit Diagnosis: Dysphagia, unspecified (R13.10)    Aspiration Risk  Mild aspiration risk    Diet Recommendation Regular;Thin liquid    Liquid Administration via: Straw Medication Administration: Whole meds with liquid Supervision: Patient able to self feed Compensations: Slow rate;Small sips/bites;Other (Comment) (start intake with liquids) Postural Changes: Remain upright for at least 30 minutes after po intake;Seated upright at 90 degrees    Other  Recommendations Oral Care Recommendations: Oral care BID     Assistance Recommended at Discharge  N/a  Functional Status Assessment Patient has had a recent decline in their functional status and demonstrates the ability to make significant improvements in function in a reasonable and predictable amount of time.  Frequency and Duration min 1 x/week  1 week       Prognosis Prognosis for improved oropharyngeal function: Fair Barriers to Reach Goals: Other (Comment) (medical diagnosis)      Swallow Study   General Date of Onset: 08/11/24 HPI: 68 yo male adm to [facility] with worsening dyspnea. Wife reports pt needing more oxygen.  CT chest concerning for aspiration as it showed bilateral lower lobe consolidation.  Pt with PMH + for GERD, multiple myeloma, COPD, sinusitis, ILD.  CT chest showed Diffuse pulmonary parenchymal ground-glass  superimposed on interstitial and subpleural pulmonary fibrosis,  better evaluated on CT chest 11/25/2022. Per note from palliative care NP, pt has been less mobile, has poor appetite, prefers easier to chew foods and was not consuming protein shakes and he has issues with constipation - for which he uses Miralax daily.   He is a full code.  Pt is afebrile, with RR up to 28.  Pt was on aheart healthy diet but he was subsequently made NPO due to concern for aspiration. Type of Study: Bedside Swallow Evaluation Previous Swallow Assessment: MBS 10/2023 and esophagram 10/2023 Diet  Prior to this Study: Thin liquids (Level 0);Regular Temperature Spikes Noted: No Respiratory Status: Nasal cannula (HFNC 8) History of Recent Intubation: No Behavior/Cognition: Cooperative;Alert Oral Cavity Assessment: Within Functional Limits Oral Care Completed by SLP: No Oral Cavity - Dentition: Adequate natural dentition Vision: Functional for self-feeding Self-Feeding Abilities: Able to feed self Patient Positioning: Upright in bed Baseline Vocal Quality: Not observed;Hoarse (hoarse voice, largely clears dry swallows) Volitional Cough: Strong Volitional Swallow: Able to elicit    Oral/Motor/Sensory Function Overall Oral Motor/Sensory Function: Within functional limits   Ice Chips Ice chips: Not tested   Thin Liquid Thin Liquid: Impaired Presentation: Straw Pharyngeal  Phase Impairments: Cough - Delayed    Nectar Thick Nectar Thick Liquid: Not tested   Honey Thick Honey Thick Liquid: Not tested   Puree Puree: Not tested   Solid     Other Comments: pt observed taking a single pill with liquids      Nicolas Emmie Caldron 08/11/2024,10:42 AM  Madelin POUR, MS [facility] SLP Acute Rehab Services Office [phone]

## 2024-08-11 NOTE — Consult Note (Signed)
 NAME:  Samuel [name], MRN:  [id number], DOB:  08/10/1956, LOS: 2 ADMISSION DATE:  08/09/2024 CONSULTATION DATE:  08/10/2024 REFERRING MD:  Dibia - TRH, CHIEF COMPLAINT:  AoC RF, pulmonary fibrosis   History of Present Illness:  68 year old man who presented to [facility] ED 10/21 for SOB/respiratory distress. PMHx significant for HLD, acute-on-chronic respiratory failure in the setting of ILD/pulmonary fibrosis (followed by Dr. Geronimo), asthma, multiple myeloma, GERD, hypothyroidism, anxiety.  Patient presented to ED after spouse called Palliative Care office stating patient was having severe difficulty breathing (progressive over 3-day course) and cough; initially were requesting larger portable O2 tank/concentrator. Patient is on 6-8LNC at baseline with SpO2 mid-70s with exertion; O2 is increased to 15L for recovery. Also notes fatigue, lightheadedness and anxiousness. On ED arrival, patient was afebrile wit hHR 69, BP 158/96, RR 18, SpO2 99% on NRB. Labs were notable for WBC 9.7, Hgb 13.3, Plt 245; Na 132, K 4.7, CO2 30, Cr 0.61, LFTs WNL. COVID/Flu/RSV negative. CXR demonstrated continued pulmonary fibrosis with low lung volumes and increased GGOs in the lungs. CTA Chest PE protocol was negative for PE, +bilateral lower lobe consolidation, small pericardial effusion, mild diffuse pulmonary parenchymal ground glass, underlying ILD.  Admitted under TRH, PCCM consulted for pulmonary recommendations.  Signe states that he has had progressively worsening respiratory status since 11/2023. He is now requiring more baseline O2 both at rest and with exertion (6-8L at rest, 10-15L with exertion). It seems like it is taking him longer to recover after exerting himself. He has a frequent dry cough, nonproductive of sputum. Denies fevers/chills, CP, headache/dizziness, recent known sick contacts. Unfortunately has not tolerated anti-fibrotics well and has not tolerated pulmonary HTN medications, even in different  dosage forms. His anxiety surrounding his SOB/increased WOB and DOE is worsened as well. Per chart review, referred to [facility] for lung transplant evaluation but not a candidate due to multiple myeloma; for this reason, he is also unfortunately not a candidate for clinical trials.   Pertinent Medical History:   Past Medical History:  Diagnosis Date   Asthma    GERD (gastroesophageal reflux disease)    High cholesterol    under control.    History of blood transfusion    Hypothyroidism    Interstitial lung disease (HCC)    Multiple myeloma (HCC)    Perennial allergic rhinitis    Pneumonia    Sciatic pain, right    Seasonal allergic rhinitis    Thyroid  disease    Significant Hospital Events: Including procedures, antibiotic start and stop dates in addition to other pertinent events   10/20 - Presented to [facility] ED for progressive SOB x 3 days. Admitted via TRH. 10/21 - PCCM consulted for pulmonary recommendations.  Interim History / Subjective:  Feels a bit better this morning.  More energy.  Remains on 8 L.  Oxygen saturation high 90s.  Likely can wean.  Objective:  Blood pressure (!) 151/87, pulse 95, temperature 98 F (36.7 C), resp. rate 15, weight 83 kg, SpO2 97%.    FiO2 (%):  [40 %-55 %] 40 %   Intake/Output Summary (Last 24 hours) at 08/11/2024 1156 Last data filed at 08/11/2024 9378 Gross per 24 hour  Intake 350 ml  Output 1200 ml  Net -850 ml   Filed Weights   08/09/24 1445  Weight: 83 kg   Physical Examination: General: Chronically ill-appearing older man in NAD, sitting up in bed HEENT: [place]/AT, anicteric sclera, PERRL, moist mucous membranes. Neuro: Alert oriented  no focal weakness CV: Regular rate and rhythm, no murmur appreciated PULM: Given it, mild improvement in work of breathing, on 8 L nasal cannula via salter, scattered crackles GI: Nondistended Extremities: No LE edema noted.  Resolved Hospital Problem List:    Assessment & Plan:   Acute on  chronic hypoxemic respiratory failure: Acute worsening presumed ascending pneumonia as demonstrated on CT scan.  Chronicity related to ILD and VQ mismatch in the setting of pulmonary hypertension.  Notably, procalcitonin is negative.  Pro BNP is not elevated. -- Continue IV antibiotics for CAP coverage -- Aggressive IV diuresis -- Continue IV steroids. 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  Gradually worsening on imaging over time of worsening on glass opacities.  Denies frank aspiration symptoms.  Does have breakthrough GERD symptoms. -- IV steroids -- PPI p.o. 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  Possible worsen over time and gradual fluid overload accounts for subacute to chronic worsening of oxygenation over the last 6 to 8 months. --Aggressive IV diuresis, pro BNP within normal limits  Labs:  CBC: Recent Labs  Lab 08/09/24 1516 08/10/24 0622  WBC 9.7 8.3  NEUTROABS 8.4*  --   HGB 13.3 12.4*  HCT 40.8 38.8*  MCV 95.1 95.1  PLT 245 219   Basic Metabolic Panel: Recent Labs  Lab 08/09/24 1516 08/10/24 0622  NA 132* 133*  K 4.7 4.5  CL 94* 95*  CO2 30 32  GLUCOSE 99 86  BUN 17 15  CREATININE 0.61 0.73  CALCIUM  9.4 9.0   GFR: Estimated Creatinine Clearance: 91.3 mL/min (by C-G formula based on SCr of 0.73 mg/dL). Recent Labs  Lab 08/09/24 1516 08/10/24 0622 08/10/24 1806  PROCALCITON  --   --  <0.10  WBC 9.7 8.3  --    Liver Function Tests: Recent Labs  Lab 08/09/24 1516 08/10/24 0622  AST 34 26  ALT 27 22  ALKPHOS 68 57  BILITOT 0.4 0.4  PROT 7.9 7.0  ALBUMIN 4.0 3.5   No results for input(s): LIPASE, AMYLASE in the last 168 hours. No results for input(s): AMMONIA in the last 168 hours.  ABG:    Component Value Date/Time   PHART 7.445 06/15/2021 2045   PCO2ART 34.0 06/15/2021 2045   PO2ART 96.6  06/15/2021 2045   HCO3 31.0 (H) 01/23/2024 1020   HCO3 32.0 (H) 01/23/2024 1020   TCO2 33 (H) 01/23/2024 1020   TCO2 34 (H) 01/23/2024 1020   O2SAT 75 01/23/2024 1020   O2SAT 72 01/23/2024 1020    Coagulation Profile: No results for input(s): INR, PROTIME in the last 168 hours.  Cardiac Enzymes: No results for input(s): CKTOTAL, CKMB, CKMBINDEX, TROPONINI in the last 168 hours.  HbA1C: Hgb A1c MFr Bld  Date/Time Value Ref Range Status  09/27/2021 09:29 AM 6.2 4.6 - 6.5 % Final    Comment:    Glycemic Control Guidelines for People with Diabetes:Non Diabetic:  <6%Goal of Therapy: <7%Additional Action Suggested:  >8%   07/12/2021 09:54 AM 5.7 4.6 - 6.5 % Final    Comment:    Glycemic Control Guidelines for People with Diabetes:Non Diabetic:  <6%Goal of Therapy: <7%Additional Action Suggested:  >8%    CBG: No results for input(s): GLUCAP in the last 168 hours.  Review of Systems:   N/a  Past Medical History:  He,  has a past  medical history of Asthma, GERD (gastroesophageal reflux disease), High cholesterol, History of blood transfusion, Hypothyroidism, Interstitial lung disease (HCC), Multiple myeloma (HCC), Perennial allergic rhinitis, Pneumonia, Sciatic pain, right, Seasonal allergic rhinitis, and Thyroid  disease.   Surgical History:   Past Surgical History:  Procedure Laterality Date   BRONCHIAL BIOPSY  06/18/2021   Procedure: BRONCHIAL BIOPSIES;  Surgeon: Shelah Lamar RAMAN, MD;  Location: [facility];  Service: Cardiopulmonary;;   BRONCHIAL BIOPSY  08/28/2021   Procedure: BRONCHIAL BIOPSIES;  Surgeon: Brenna Adine CROME, DO;  Location: [facility];  Service: Cardiopulmonary;;   BRONCHIAL WASHINGS  06/18/2021   Procedure: BRONCHIAL WASHINGS;  Surgeon: Shelah Lamar RAMAN, MD;  Location: [facility];  Service: Cardiopulmonary;;   BRONCHIAL WASHINGS  08/28/2021   Procedure: BRONCHIAL WASHINGS;  Surgeon: Brenna Adine CROME, DO;  Location: [facility];  Service:  Cardiopulmonary;;   NASAL SINUS SURGERY     NECK SURGERY  1996   RIGHT HEART CATH N/A 01/23/2024   Procedure: RIGHT HEART CATH;  Surgeon: Elmira Newman PARAS, MD;  Location: [facility]AS[facility] CV [facility];  Service: Cardiovascular;  Laterality: N/A;   VIDEO BRONCHOSCOPY N/A 06/18/2021   Procedure: VIDEO BRONCHOSCOPY WITH FLUORO;  Surgeon: Shelah Lamar RAMAN, MD;  Location: [facility];  Service: Cardiopulmonary;  Laterality: N/A;   VIDEO BRONCHOSCOPY N/A 08/28/2021   Procedure: VIDEO BRONCHOSCOPY WITH FLUORO;  Surgeon: Brenna Adine CROME, DO;  Location: [facility];  Service: Cardiopulmonary;  Laterality: N/A;    Social History:   reports that he quit smoking about 41 years ago. His smoking use included cigarettes. He started smoking about 44 years ago. He has a 0.3 pack-year smoking history. He has never used smokeless tobacco. He reports that he does not currently use alcohol. He reports that he does not use drugs.   Family History:  His family history includes Allergies in his father and mother; Breast cancer in his paternal grandmother; CAD (age of onset: 38) in his father; Heart disease in an other family member; Hypertension in his mother and another family member.   Allergies: Allergies  Allergen Reactions   Amlodipine  Swelling   Beclomethasone Other (See Comments)    unknown   Ciclesonide Other (See Comments)    unknown   Clarithromycin Other (See Comments)    Hiccups   Ofev  [Nintedanib] Other (See Comments)    GI bleeding   Penicillins Other (See Comments)    Child hood unsure   Rosuvastatin      Other Reaction(s): hand swelling   Trazodone     Other Reaction(s): sweating   Olga [Treprostinil Sodium] Cough    Bronchial spasms, sore throat.   Home Medications: Prior to Admission medications   Medication Sig Start Date End Date Taking? Authorizing Provider  acyclovir  (ZOVIRAX ) 400 MG tablet TAKE ONE TABLET BY MOUTH TWICE DAILY 06/23/24  Yes Federico Norleen ONEIDA MADISON, MD  albuterol  (VENTOLIN   HFA) 108 (90 Base) MCG/ACT inhaler Inhale 2 puffs into the lungs every 6 hours as needed for wheezing or shortness of breath. 06/11/24  Yes Federico Norleen ONEIDA MADISON, MD  azelastine  (ASTELIN ) 0.1 % nasal spray Place 2 sprays into both nostrils 2 (two) times daily. Use in each nostril as directed 10/07/23  Yes Soldatova, Elena, MD  azithromycin  (ZITHROMAX ) 250 MG tablet 250mg  once daily on  Monday Wed Friday 07/30/24  Yes Geronimo Amel, MD  bisoprolol  (ZEBETA ) 5 MG tablet Take 1 tablet (5 mg total) by mouth daily. 10/17/21  Yes Geronimo Amel, MD  Budeson-Glycopyrrol-Formoterol (BREZTRI  AEROSPHERE) 160-9-4.8 MCG/ACT AERO  Inhale 2 puffs into the lungs in the morning and at bedtime. 10/13/23  Yes Parrett, Tammy S, NP  cetirizine  (ZYRTEC ) 10 MG tablet Take 1 tablet (10 mg total) by mouth daily. 10/07/23  Yes Soldatova, Liuba, MD  cholecalciferol (VITAMIN D) 1000 UNITS tablet Take 3,000 Units by mouth daily.   Yes [provider]  dextromethorphan (DELSYM) 30 MG/5ML liquid Take 30 mg by mouth as needed for cough.   Yes [provider]  fluticasone  (FLONASE) 50 MCG/ACT nasal spray Place 1 spray into both nostrils daily as needed for allergies or rhinitis.   Yes [provider]  furosemide  (LASIX ) 40 MG tablet Take 1 tablet (40 mg total) by mouth daily as needed. 06/10/24  Yes Geronimo Amel, MD  HYDROcodone  bit-homatropine (HYCODAN) 5-1.5 MG/5ML syrup Take 5 mLs by mouth every 4 (four) hours as needed for cough. 07/11/24  Yes Geronimo Amel, MD  ibuprofen (ADVIL) 200 MG tablet Take 200 mg by mouth every 6 (six) hours as needed for moderate pain (pain score 4-6).   Yes [provider]  levothyroxine  (SYNTHROID ) 50 MCG tablet Take 50 mcg by mouth every morning. 11/14/23  Yes [provider]  montelukast  (SINGULAIR ) 10 MG tablet Take 10 mg by mouth at bedtime.   Yes [provider]  Multiple Vitamin (MULTIVITAMIN) tablet Take 1 tablet by mouth daily.   Yes  [provider]  omeprazole  (PRILOSEC) 40 MG capsule Take 1 capsule (40 mg total) by mouth 2 (two) times daily. Take 30 minutes before a meal 05/11/24  Yes Soldatova, Liuba, MD  OXYGEN Inhale 2 L into the lungs continuous.   Yes [provider]  potassium chloride  SA (KLOR-CON  M20) 20 MEQ tablet Take 1 tablet (20 mEq total) by mouth daily. 06/10/24  Yes Geronimo Amel, MD  sodium chloride  (OCEAN) 0.65 % SOLN nasal spray Place 1 spray into both nostrils as needed for congestion.   Yes [provider]  tamsulosin  (FLOMAX ) 0.4 MG CAPS capsule Take 0.4 mg by mouth daily.   Yes [provider]  thyroid  (ARMOUR) 90 MG tablet Take 90 mg by mouth daily. 06/23/24  Yes [provider]  triamcinolone  cream (KENALOG ) 0.1 % Apply 1 application topically daily as needed (sun burn itch). 01/15/21  Yes [provider]  Turmeric 400 MG CAPS Take 1 capsule by mouth daily.   Yes [provider]  albuterol  (PROVENTIL ) (2.5 MG/3ML) 0.083% nebulizer solution USE ONE VIAL IN NEBULIZER EVERY 6 HOURS AS NEEDED WHEEZING OR SHORTNESS OF BREATH Patient not taking: No sig reported 09/12/23   Geronimo Amel, MD  ALPRAZolam  (XANAX ) 0.5 MG tablet Take 0.5 mg by mouth at bedtime.    [provider]  ASSESS FULL RANGE PEAK METER DEVI as directed. Patient not taking: No sig reported 07/15/11   [provider]  EPINEPHrine  (EPI-PEN) 0.3 mg/0.3 mL DEVI Inject 0.3 mg into the muscle as needed. Patient not taking: No sig reported    [provider]  ezetimibe -simvastatin  (VYTORIN ) 10-40 MG tablet Take 1 tablet by mouth daily. 07/27/24   Patwardhan, Newman PARAS, MD  fexofenadine (ALLEGRA) 180 MG tablet Take 180 mg by mouth daily. Patient not taking: No sig reported    [provider]  ketoconazole (NIZORAL) 2 % cream Apply 1 Application topically 2 (two) times daily as needed for irritation. Patient not taking: Reported on 08/10/2024 09/21/21    [provider]  predniSONE  (DELTASONE ) 10 MG tablet Take 1 tablet (10 mg total) by mouth  daily with breakfast. Patient not taking: No sig reported 05/14/24   Geronimo Amel, MD  sildenafil (REVATIO) 20 MG tablet Take 20 mg by mouth daily as needed (ED). Patient not taking: No sig reported 07/12/19   [provider]  Simethicone  (SIMETHICONE  ULTRA STRENGTH) 180 MG CAPS Take 1 capsule (180 mg total) by mouth 3 (three) times daily as needed. Patient not taking: No sig reported 01/06/22   Theadore Ozell HERO, MD  Treprostinil ([name]) 0.6 MG/ML SOLN Inhale into the lungs 4 (four) times daily. Inhale 3 breaths per treatment session four times daily. Increase weekly to goal dose 9 to 12 breaths per treatment session four times daily Patient not taking: No sig reported    [provider]    Signature:   Donnice JONELLE Beals, MD [facility] 08/11/24 11:56 AM  Please see [email or web address] for pager details.  From 7A-7P if no response, please call [phone] After hours, please call ELink [phone]

## 2024-08-11 NOTE — Progress Notes (Signed)
 Progress Note   Patient: Samuel [name] FMW:[id number] DOB: 28-Aug-1956 DOA: 08/09/2024     2 DOS: the patient was seen and examined on 08/11/2024   Brief hospital course: TORIAN QUINTERO is a 68 y.o. male with medical history significant of interstitial lung disease which is advanced being followed by pulmonology, hypothyroidism, GERD, asthma, multiple myeloma, who presented to the ER with progressive worsening of shortness of breath over the last 3 days.  He normally on 6 L of oxygen at baseline.  On arrival patient was requiring up to 10 L of oxygen.  Also associated with some anxiety.  Patient is weak.   Per patient and wife this is worse from his baseline.   Assessment and Plan: #1 acute on chronic hypoxic respiratory failure: Most likely due to worsening and advanced interstitial lung disease.  CT reviewed-Concern for Aspiration Pneumonia Continue IV steroid 40mg  q8, antibiotics, DC fluids -Diurese with 40mg  of lasix  IV   pulmonary consulted.   Continue nebulizer treatments.  Oxygen titration to his baseline.   #2 interstitial lung disease: Advanced.  Being followed by pulmonary. Not a candidate for Lung transplant due to Myeloma. #3 history of multiple myeloma: Defer to oncology outpatient.   #4 hyperlipidemia: Confirm and resume statin   #5 history of asthma: Could be having an asthma exacerbation as well.  Will continue with steroids antibiotics and supportive care.   #6 coronary artery disease: Appears to be compensated at baseline.   #7 anxiety disorder: Continue anxiolytics.      Advance Care Planning:   Code Status: Full Code    Consults:  pulmonary consult    Family Communication: Wife at bedside       Subjective: Feels better today, on 8L of oxygen    Physical Exam: Constitutional: Chronically ill looking, in mild respiratory distress Eyes: PERRL, lids and conjunctivae normal ENMT: Mucous membranes are moist. Posterior pharynx clear of any exudate or  lesions.Normal dentition.  Neck: normal, supple, no masses, no thyromegaly Respiratory: Bibasilar crackles, on 8L  Cardiovascular: Regular rate and rhythm, no murmurs / rubs / gallops. No extremity edema. 2+ pedal pulses. No carotid bruits.  Abdomen: no tenderness, no masses palpated. No hepatosplenomegaly. Bowel sounds positive.  Musculoskeletal: Good range of motion, no joint swelling or tenderness, Skin: no rashes, lesions, ulcers. No induration Neurologic: CN 2-12 grossly intact. Sensation intact, DTR normal. Strength 5/5 in all 4.  Psychiatric: Normal judgment and insight. Alert and oriented x 3.  Anxious mood Vitals:   08/10/24 2003 08/10/24 2347 08/11/24 0350 08/11/24 0816  BP:  134/85 (!) 151/87   Pulse:  (!) 102 95   Resp:  17 15   Temp:  98.5 F (36.9 C) 98 F (36.7 C)   TempSrc:      SpO2: 98% 97% 99% 97%  Weight:        Data Reviewed:  CBC    Component Value Date/Time   WBC 8.3 08/10/2024 0622   RBC 4.08 (L) 08/10/2024 0622   HGB 12.4 (L) 08/10/2024 0622   HGB 14.1 07/14/2024 0822   HCT 38.8 (L) 08/10/2024 0622   PLT 219 08/10/2024 0622   PLT 184 07/14/2024 0822   MCV 95.1 08/10/2024 0622   MCH 30.4 08/10/2024 0622   MCHC 32.0 08/10/2024 0622   RDW 12.9 08/10/2024 0622   LYMPHSABS 0.8 08/09/2024 1516   MONOABS 0.4 08/09/2024 1516   EOSABS 0.1 08/09/2024 1516   BASOSABS 0.0 08/09/2024 1516    CMP  Component Value Date/Time   NA 133 (L) 08/10/2024 0622   K 4.5 08/10/2024 0622   CL 95 (L) 08/10/2024 0622   CO2 32 08/10/2024 0622   GLUCOSE 86 08/10/2024 0622   BUN 15 08/10/2024 0622   CREATININE 0.73 08/10/2024 0622   CREATININE 0.81 07/14/2024 0822   CALCIUM  9.0 08/10/2024 0622   PROT 7.0 08/10/2024 0622   ALBUMIN 3.5 08/10/2024 0622   AST 26 08/10/2024 0622   AST 25 07/14/2024 0822   ALT 22 08/10/2024 0622   ALT 20 07/14/2024 0822   ALKPHOS 57 08/10/2024 0622   BILITOT 0.4 08/10/2024 0622   BILITOT 0.4 07/14/2024 0822   GFR 89.83 09/27/2021  0929   GFRNONAA >60 08/10/2024 0622   GFRNONAA >60 07/14/2024 0822     Family Communication: Wife at bedside  Disposition: Status is: Inpatient Remains inpatient appropriate because: On going hypoxia  Planned Discharge Destination: Home with Home Health    Time spent: 24 minutes  Author: Landon FORBES Baller, MD 08/11/2024 1:00 PM  For on call review [email or web address].

## 2024-08-11 NOTE — Telephone Encounter (Signed)
 Pt's wife called requesting if Levon in Palliative Care would give her a call 30 mins prior to her arrival to speak with pt and family regarding hospice.  Spoke with Levon regarding the time of when she would be doing rounds on the pt and she stated she will be there between 12:15pm and 12:30pm.  Informed pt's wife so she can notify the pt's children of Nikki's expected arrival.

## 2024-08-11 NOTE — Progress Notes (Addendum)
 SLP Cancellation Note  Patient Details Name: Samuel [name] MRN: [id number] DOB: 10/31/1955   Cancelled treatment:       Reason Eval/Treat Not Completed: Pt had requested SLP come back to see him later today - but he is with chaplain at this time; note plan for hospice services. Will continue efforts.   Madelin POUR, MS [facility] SLP Acute Rehab Services Office [phone]   Samuel [name] 08/11/2024, 4:10 PM

## 2024-08-11 NOTE — TOC Initial Note (Signed)
 Transition of Care [facility]) - Initial/Assessment Note   Patient Details  Name: Samuel [name] MRN: [id number] Date of Birth: [birth year]  Transition of Care [facility]) CM/SW Contact:    Duwaine GORMAN Aran, LCSW Phone Number: 08/11/2024, 9:32 AM  Clinical Narrative: Patient is from home with spouse. Patient currently receiving IV antibiotics and is on 8L HFNC. Care management following for possible discharge needs.  Expected Discharge Plan: Home/Self Care Barriers to Discharge: Continued Medical Work up  Expected Discharge Plan and Services In-house Referral: Clinical Social Work Living arrangements for the past 2 months: Single Family Home  Prior Living Arrangements/Services Living arrangements for the past 2 months: Single Family Home Lives with:: Spouse Patient language and need for interpreter reviewed:: Yes Do you feel safe going back to the place where you live?: Yes      Need for Family Participation in Patient Care: No (Comment) Care giver support system in place?: Yes (comment) Criminal Activity/Legal Involvement Pertinent to Current Situation/Hospitalization: No - Comment as needed  Activities of Daily Living ADL Screening (condition at time of admission) Independently performs ADLs?: Yes (appropriate for developmental age) Is the patient deaf or have difficulty hearing?: No Does the patient have difficulty seeing, even when wearing glasses/contacts?: No Does the patient have difficulty concentrating, remembering, or making decisions?: No  Emotional Assessment Orientation: : Oriented to Self, Oriented to Place, Oriented to  Time, Oriented to Situation Alcohol / Substance Use: Not Applicable Psych Involvement: No (comment)  Admission diagnosis:  Hypoxia [R09.02] Acute on chronic respiratory failure (HCC) [J96.20] Patient Active Problem List   Diagnosis Date Noted   Acute on chronic respiratory failure (HCC) 08/09/2024   Essential hypertension 07/04/2023   Coronary artery  disease 11/28/2022   Mixed hyperlipidemia 11/28/2022   Recurrent sinusitis 11/04/2022   Acute bacterial rhinosinusitis 09/26/2022   Chronic respiratory failure with hypoxia (HCC) 08/01/2022   S/P bronchoscopy    ILD (interstitial lung disease) (HCC)    Preop cardiovascular exam 08/21/2021   Pneumonitis 07/20/2021   Anxiety 07/20/2021   Interstitial pulmonary disease (HCC) 07/20/2021   Abnormal CT of the chest 06/18/2021   Dyspnea 06/15/2021   Hypoxia 06/15/2021   Multiple myeloma (HCC) 03/05/2021   Multiple myeloma not having achieved remission (HCC) 03/05/2021   Bone lesion 02/14/2021   Monoclonal gammopathy 02/14/2021   High cholesterol    Allergic rhinitis 11/23/2020   Mild persistent asthma 11/23/2020   SOB (shortness of breath) 10/04/2011   PCP:  Seabron Lenis, MD Pharmacy:   [facility] [place], [place] - [address] [place] [address] Phone: [phone] Fax: [phone]  [facility] [address] [place] [address] Phone: [phone] Fax: [phone]  Social Drivers of Health (SDOH) Social History: SDOH Screenings   Food Insecurity: No Food Insecurity (08/10/2024)  Housing: Low Risk  (08/10/2024)  Transportation Needs: No Transportation Needs (01/14/2024)   Received from [facility]  Utilities: Low Risk  (01/14/2024)   Received from [facility]  Depression (PHQ2-9): Low Risk  (02/03/2024)  Financial Resource Strain: Low Risk  (03/15/2022)   Received from [facility]  Physical Activity: Insufficiently Active (01/10/2022)   [facility] from [facility]  Social Connections: Socially Integrated (01/10/2022)   Received from [facility]  Stress: No Stress Concern Present (01/10/2022)   Received from [facility]  Tobacco Use: Medium Risk (07/30/2024)   SDOH Interventions:    Readmission Risk Interventions  No data to display

## 2024-08-12 ENCOUNTER — Ambulatory Visit (INDEPENDENT_AMBULATORY_CARE_PROVIDER_SITE_OTHER): Admitting: Otolaryngology

## 2024-08-12 DIAGNOSIS — F419 Anxiety disorder, unspecified: Secondary | ICD-10-CM | POA: Diagnosis not present

## 2024-08-12 DIAGNOSIS — J8489 Other specified interstitial pulmonary diseases: Secondary | ICD-10-CM | POA: Diagnosis not present

## 2024-08-12 DIAGNOSIS — J9621 Acute and chronic respiratory failure with hypoxia: Secondary | ICD-10-CM | POA: Diagnosis not present

## 2024-08-12 DIAGNOSIS — Z515 Encounter for palliative care: Secondary | ICD-10-CM | POA: Diagnosis not present

## 2024-08-12 DIAGNOSIS — Z7189 Other specified counseling: Secondary | ICD-10-CM | POA: Diagnosis not present

## 2024-08-12 LAB — BASIC METABOLIC PANEL WITH GFR
Anion gap: 9 (ref 5–15)
BUN: 29 mg/dL — ABNORMAL HIGH (ref 8–23)
CO2: 30 mmol/L (ref 22–32)
Calcium: 9.2 mg/dL (ref 8.9–10.3)
Chloride: 95 mmol/L — ABNORMAL LOW (ref 98–111)
Creatinine, Ser: 0.75 mg/dL (ref 0.61–1.24)
GFR, Estimated: >60 mL/min (ref >60)
Glucose, Bld: 134 mg/dL — ABNORMAL HIGH (ref 70–99)
Potassium: 4.5 mmol/L (ref 3.5–5.1)
Sodium: 133 mmol/L — ABNORMAL LOW (ref 135–145)

## 2024-08-12 LAB — KAPPA/LAMBDA LIGHT CHAINS
Kappa free light chain: 26.7 mg/L — ABNORMAL HIGH (ref 3.3–19.4)
Kappa, lambda light chain ratio: 1.95 — ABNORMAL HIGH (ref 0.26–1.65)
Lambda free light chains: 13.7 mg/L (ref 5.7–26.3)

## 2024-08-12 MED ORDER — FUROSEMIDE 10 MG/ML IJ SOLN
40.0000 mg | Freq: Two times a day (BID) | INTRAMUSCULAR | Status: DC
  Administered 2024-08-12 – 2024-08-13 (×4): 40 mg via INTRAVENOUS
  Filled 2024-08-12 (×4): qty 4

## 2024-08-12 MED ORDER — DOCUSATE SODIUM 100 MG PO CAPS
100.0000 mg | ORAL_CAPSULE | Freq: Two times a day (BID) | ORAL | Status: DC
  Administered 2024-08-12 – 2024-08-14 (×4): 100 mg via ORAL
  Filled 2024-08-12 (×4): qty 1

## 2024-08-12 MED ORDER — POLYETHYLENE GLYCOL 3350 17 G PO PACK
17.0000 g | PACK | Freq: Every day | ORAL | Status: DC | PRN
  Administered 2024-08-12 – 2024-08-13 (×2): 17 g via ORAL
  Filled 2024-08-12 (×2): qty 1

## 2024-08-12 NOTE — Progress Notes (Signed)
 PROGRESS NOTE    Samuel [name]  FMW:[id number] DOB: 1956-02-12 DOA: 08/09/2024 PCP: Seabron Lenis, MD    Brief Narrative:   Samuel [name] is a 68 y.o. male with past medical history significant for advanced interstitial lung disease (on 6-8L [place] at baseline), hypothyroidism, asthma, multiple myeloma, GERD who presented to [facility] ED on 08/09/2024 with progressive shortness of breath over the last 3 days.  Additionally reports progressive weakness since February 2025.  In the ED, temperature 99.0 F, HR 64, RR 32, BP 157/91, SpO2 93% on 6 L nasal cannula and subsequent placed on 15 L nonrebreather.  WBC 9.7, hemoglobin 13.3, platelet count 245.  Sodium 132, potassium 4.7, chloride 94, CO2 30, glucose 99, BUN 17, creatinine 0.61.  AST 34, ALT 27, total bilirubin 0.4.  COVID/influenza/RSV PCR negative.  Respiratory viral panel negative.  Chest x-ray with continued findings of pulmonary fibrosis with low lung volumes, increased ground glass opacities within the lungs.  CT angiogram chest PE with limited exam but no pulmonary embolism noted, bilateral lower lobe consolidation, small pericardial effusion, mild diffuse pulmonary parenchymal ground glass, underlying interstitial disease.  TRH was consulted for admission for further evaluation management of acute on chronic hypoxic respiratory failure secondary to ILD exacerbation.  Assessment & Plan:   Acute on chronic hypoxic respiratory failure, POA Acute exacerbation of interstitial lung disease Patient presenting with progressive shortness of breath, weakness.  At baseline on 6-8 L nasal cannula at rest, has been requiring up to 10-15 L with minimal exertion at home.  Followed by pulmonology, Dr. Geronimo outpatient.  Has failed multiple ILD agents including antifibrotic's and has not tolerated pulmonary HTN medications.  Was referred to [facility] for lung transplant but not a candidate due to multiple myeloma.  Patient afebrile without leukocytosis.   CT imaging on admission notable for increased ground glass opacities, likely secondary to worsening fibrosis/inflammatory response. -- Pulmonology following, appreciate assistance -- Doxycycline 100 mg IV every 12 hours -- Ceftriaxone 1 g IV every 24 hours -- Solu-Medrol  40 mg IV q8h -- Furosemide  40 mg IV every 12 hours -- Montelukast  10 mg p.o. nightly -- Supportive care, overall prognosis poor given progression of disease with anticipated discharge to hospice at home  HTN -- Bisoprolol  5 mg p.o. daily  Anxiety -- Xanax  1 mg p.o. every 8 hours  Hypothyroidism -- [place]our Thyroid  90 mg p.o. daily -- Levothyroxine  50 mcg p.o. daily  Multiple myeloma Follows with medical oncology, Dr. Federico outpatient.  Also follows with palliative care.  GERD -- Protonix  40 m p.o. twice daily  BPH -- Tamsulosin  0.4 mg p.o. daily  Generalized weakness, debility, deconditioning Ongoing since February 2025 which has been progressive.  Likely related directly to his progressive interstitial lung disease as above.  -- PT/OT evaluation  DVT prophylaxis: enoxaparin  (LOVENOX ) injection 40 mg Start: 08/09/24 2200    Code Status: Limited: Do not attempt resuscitation (DNR) -DNR-LIMITED -Do Not Intubate/DNI  Family Communication: Updated daughter present at bedside this morning  Disposition Plan:  Level of care: Progressive Status is: Inpatient Remains inpatient appropriate because: IV steroids, overall prognosis poor with planned discharge to home hospice    Consultants:  PCCM  Procedures:  None  Antimicrobials:  Doxycycline 10/20>> Ceftriaxone 10/20>>   Subjective: Patient seen examined bedside, lying in bed.  Slightly anxious.  Daughter present at bedside.  Continues with dyspnea, remains on 7 L high flow nasal cannula which is at baseline but reports much increased dyspnea when exerts himself minimally.  Has been requiring up to 10-15 L per his report.  Discussed with pulmonology this  morning, Dr. Annella, overall prognosis is poor given his advanced and progressive interstitial lung disease with failed outpatient medication trials.  Seen by hospice RN this morning with plan home hospice on discharge.  Patient requesting to work with therapy today.  No other questions or concerns at this time.  Denies headache, no dizziness, no chest pain, no fever/chills/night sweats, no nausea/vomiting/diarrhea, no abdominal pain, no focal weakness, no paresthesias.  No acute events overnight per nursing staff.  Objective: Vitals:   08/12/24 1240 08/12/24 1245 08/12/24 1305 08/12/24 1425  BP:    130/83  Pulse:    83  Resp:    (!) 26  Temp:    (!) 97.5 F (36.4 C)  TempSrc:    Oral  SpO2: (!) 87% 97% 95% 99%  Weight:      Height:        Intake/Output Summary (Last 24 hours) at 08/12/2024 1454 Last data filed at 08/12/2024 1428 Gross per 24 hour  Intake 1260 ml  Output 1500 ml  Net -240 ml   Filed Weights   08/09/24 1445  Weight: 83 kg    Examination:  Physical Exam: GEN: NAD, alert and oriented x 3, chronically ill in appearance, anxious HEENT: NCAT, PERRL, EOMI, sclera clear, MMM PULM: + Crackles, normal respiratory effort with accessory muscle use, on 8L [place] CV: RRR  GI: abd soft, NTND, + BS MSK: no peripheral edema, moves all extremity independently NEURO: No focal neurological deficit PSYCH: Anxious mood Integumentary: No concerning rashes/lesions/wounds none exposed skin surfaces    Data Reviewed: I have personally reviewed following labs and imaging studies  CBC: Recent Labs  Lab 08/09/24 1516 08/10/24 0622  WBC 9.7 8.3  NEUTROABS 8.4*  --   HGB 13.3 12.4*  HCT 40.8 38.8*  MCV 95.1 95.1  PLT 245 219   Basic Metabolic Panel: Recent Labs  Lab 08/09/24 1516 08/10/24 0622 08/12/24 0728  NA 132* 133* 133*  K 4.7 4.5 4.5  CL 94* 95* 95*  CO2 30 32 30  GLUCOSE 99 86 134*  BUN 17 15 29*  CREATININE 0.61 0.73 0.75  CALCIUM  9.4 9.0 9.2    GFR: Estimated Creatinine Clearance: 91.3 mL/min (by C-G formula based on SCr of 0.75 mg/dL). Liver Function Tests: Recent Labs  Lab 08/09/24 1516 08/10/24 0622  AST 34 26  ALT 27 22  ALKPHOS 68 57  BILITOT 0.4 0.4  PROT 7.9 7.0  ALBUMIN 4.0 3.5   No results for input(s): LIPASE, AMYLASE in the last 168 hours. No results for input(s): AMMONIA in the last 168 hours. Coagulation Profile: No results for input(s): INR, PROTIME in the last 168 hours. Cardiac Enzymes: No results for input(s): CKTOTAL, CKMB, CKMBINDEX, TROPONINI in the last 168 hours. BNP (last 3 results) Recent Labs    09/12/23 1018 08/11/24 0543  PROBNP 35.0 181.0   HbA1C: No results for input(s): HGBA1C in the last 72 hours. CBG: No results for input(s): GLUCAP in the last 168 hours. Lipid Profile: No results for input(s): CHOL, HDL, LDLCALC, TRIG, CHOLHDL, LDLDIRECT in the last 72 hours. Thyroid  Function Tests: No results for input(s): TSH, T4TOTAL, FREET4, T3FREE, THYROIDAB in the last 72 hours. Anemia Panel: No results for input(s): VITAMINB12, FOLATE, FERRITIN, TIBC, IRON, RETICCTPCT in the last 72 hours. Sepsis Labs: Recent Labs  Lab 08/10/24 1806  PROCALCITON <0.10    Recent Results (from the past 240  hours)  Resp panel by RT-PCR (RSV, Flu A&B, Covid) Anterior Nasal Swab     Status: None   Collection Time: 08/09/24  3:16 PM   Specimen: Anterior Nasal Swab  Result Value Ref Range Status   SARS Coronavirus 2 by RT PCR NEGATIVE NEGATIVE Final    Comment: (NOTE) SARS-CoV-2 target nucleic acids are NOT DETECTED.  The SARS-CoV-2 RNA is generally detectable in upper respiratory specimens during the acute phase of infection. The lowest concentration of SARS-CoV-2 viral copies this assay can detect is 138 copies/mL. A negative result does not preclude SARS-Cov-2 infection and should not be used as the sole basis for treatment or other  patient management decisions. A negative result may occur with  improper specimen collection/handling, submission of specimen other than nasopharyngeal swab, presence of viral mutation(s) within the areas targeted by this assay, and inadequate number of viral copies(<138 copies/mL). A negative result must be combined with clinical observations, patient history, and epidemiological information. The expected result is Negative.  Fact Sheet for Patients:  BloggerCourse.com  Fact Sheet for Healthcare Providers:  SeriousBroker.it  This test is no t yet approved or cleared by the [place]  FDA and  has been authorized for detection and/or diagnosis of SARS-CoV-2 by FDA under an Emergency Use Authorization (EUA). This EUA will remain  in effect (meaning this test can be used) for the duration of the COVID-19 declaration under Section 564(b)(1) of the Act, 21 U.S.C.section 360bbb-3(b)(1), unless the authorization is terminated  or revoked sooner.       Influenza A by PCR NEGATIVE NEGATIVE Final   Influenza B by PCR NEGATIVE NEGATIVE Final    Comment: (NOTE) The Xpert Xpress SARS-CoV-2/FLU/RSV plus assay is intended as an aid in the diagnosis of influenza from Nasopharyngeal swab specimens and should not be used as a sole basis for treatment. Nasal washings and aspirates are unacceptable for Xpert Xpress SARS-CoV-2/FLU/RSV testing.  Fact Sheet for Patients: BloggerCourse.com  Fact Sheet for Healthcare Providers: SeriousBroker.it  This test is not yet approved or cleared by the [place]  FDA and has been authorized for detection and/or diagnosis of SARS-CoV-2 by FDA under an Emergency Use Authorization (EUA). This EUA will remain in effect (meaning this test can be used) for the duration of the COVID-19 declaration under Section 564(b)(1) of the Act, 21 U.S.C. section  360bbb-3(b)(1), unless the authorization is terminated or revoked.     Resp Syncytial Virus by PCR NEGATIVE NEGATIVE Final    Comment: (NOTE) Fact Sheet for Patients: BloggerCourse.com  Fact Sheet for Healthcare Providers: SeriousBroker.it  This test is not yet approved or cleared by the [place]  FDA and has been authorized for detection and/or diagnosis of SARS-CoV-2 by FDA under an Emergency Use Authorization (EUA). This EUA will remain in effect (meaning this test can be used) for the duration of the COVID-19 declaration under Section 564(b)(1) of the Act, 21 U.S.C. section 360bbb-3(b)(1), unless the authorization is terminated or revoked.  Performed at [facility], [address] [place], [place] [address]   Respiratory (~20 pathogens) panel by PCR     Status: None   Collection Time: 08/11/24 11:01 AM   Specimen: Nasopharyngeal Swab; Respiratory  Result Value Ref Range Status   Adenovirus NOT DETECTED NOT DETECTED Final   Coronavirus 229E NOT DETECTED NOT DETECTED Final    Comment: (NOTE) The Coronavirus on the Respiratory Panel, DOES NOT test for the novel  Coronavirus (2019 nCoV)    Coronavirus HKU1 NOT DETECTED NOT  DETECTED Final   Coronavirus NL63 NOT DETECTED NOT DETECTED Final   Coronavirus OC43 NOT DETECTED NOT DETECTED Final   Metapneumovirus NOT DETECTED NOT DETECTED Final   Rhinovirus / Enterovirus NOT DETECTED NOT DETECTED Final   Influenza A NOT DETECTED NOT DETECTED Final   Influenza B NOT DETECTED NOT DETECTED Final   Parainfluenza Virus 1 NOT DETECTED NOT DETECTED Final   Parainfluenza Virus 2 NOT DETECTED NOT DETECTED Final   Parainfluenza Virus 3 NOT DETECTED NOT DETECTED Final   Parainfluenza Virus 4 NOT DETECTED NOT DETECTED Final   Respiratory Syncytial Virus NOT DETECTED NOT DETECTED Final   Bordetella pertussis NOT DETECTED NOT DETECTED Final   Bordetella Parapertussis  NOT DETECTED NOT DETECTED Final   Chlamydophila pneumoniae NOT DETECTED NOT DETECTED Final   Mycoplasma pneumoniae NOT DETECTED NOT DETECTED Final    Comment: Performed at [facility], [address] [place], [place] [address]         Radiology Studies: No results found.      Scheduled Meds:  ALPRAZolam   1 mg Oral Q8H   azelastine   2 spray Each Nare BID   bisoprolol   5 mg Oral Daily   enoxaparin  (LOVENOX ) injection  40 mg Subcutaneous Q24H   fluticasone   1 spray Each Nare BID   furosemide   40 mg Intravenous Q12H   ipratropium-albuterol   3 mL Nebulization BID   levothyroxine   50 mcg Oral Q0600   methylPREDNISolone  (SOLU-MEDROL ) injection  40 mg Intravenous Q8H   montelukast   10 mg Oral QHS   pantoprazole   40 mg Oral BID   tamsulosin   0.4 mg Oral Daily   thyroid   90 mg Oral Daily   Continuous Infusions:  cefTRIAXone (ROCEPHIN)  IV 1 g (08/11/24 2028)   doxycycline (VIBRAMYCIN) IV 100 mg (08/12/24 0610)     LOS: 3 days    Time spent: 52 minutes spent on 08/12/2024 caring for this patient face-to-face including chart review, ordering labs/tests, documenting, discussion with nursing staff, consultants, updating family and interview/physical exam    Camellia PARAS [place], DO [facility] Available via Epic secure chat 7am-7pm After these hours, please refer to coverage provider listed on [email or web address] 08/12/2024, 2:54 PM

## 2024-08-12 NOTE — Progress Notes (Signed)
 NAME:  Samuel [name], MRN:  [id number], DOB:  1956-09-20, LOS: 3 ADMISSION DATE:  08/09/2024 CONSULTATION DATE:  08/10/2024 REFERRING MD:  Dibia - TR[facility], CHIEF COMPLAINT:  AoC RF, pulmonary fibrosis   History of Present Illness:  68 year old man who presented to [facility] ED 10/21 for SOB/respiratory distress. PMHx significant for HLD, acute-on-chronic respiratory failure in the setting of ILD/pulmonary fibrosis (followed by Dr. Geronimo), asthma, multiple myeloma, GERD, hypothyroidism, anxiety.  Patient presented to ED after spouse called Palliative Care office stating patient was having severe difficulty breathing (progressive over 3-day course) and cough; initially were requesting larger portable O2 tank/concentrator. Patient is on 6-8LNC at baseline with SpO2 mid-70s with exertion; O2 is increased to 15L for recovery. Also notes fatigue, lightheadedness and anxiousness. On ED arrival, patient was afebrile wit hHR 69, BP 158/96, RR 18, SpO2 99% on NRB. Labs were notable for WBC 9.7, Hgb 13.3, Plt 245; Na 132, K 4.7, CO2 30, Cr 0.61, LFTs WNL. COVID/Flu/RSV negative. CXR demonstrated continued pulmonary fibrosis with low lung volumes and increased GGOs in the lungs. CTA Chest PE protocol was negative for PE, +bilateral lower lobe consolidation, small pericardial effusion, mild diffuse pulmonary parenchymal ground glass, underlying ILD.  Admitted under TRH, PCCM consulted for pulmonary recommendations.  Signe states that he has had progressively worsening respiratory status since 11/2023. He is now requiring more baseline O2 both at rest and with exertion (6-8L at rest, 10-15L with exertion). It seems like it is taking him longer to recover after exerting himself. He has a frequent dry cough, nonproductive of sputum. Denies fevers/chills, CP, headache/dizziness, recent known sick contacts. Unfortunately has not tolerated anti-fibrotics well and has not tolerated pulmonary HTN medications, even in different  dosage forms. His anxiety surrounding his SOB/increased WOB and DOE is worsened as well. Per chart review, referred to [facility] for lung transplant evaluation but not a candidate due to multiple myeloma; for this reason, he is also unfortunately not a candidate for clinical trials.   Pertinent Medical History:   Past Medical History:  Diagnosis Date   Asthma    GERD (gastroesophageal reflux disease)    High cholesterol    under control.    History of blood transfusion    Hypothyroidism    Interstitial lung disease (HCC)    Multiple myeloma (HCC)    Perennial allergic rhinitis    Pneumonia    Sciatic pain, right    Seasonal allergic rhinitis    Thyroid  disease    Significant Hospital Events: Including procedures, antibiotic start and stop dates in addition to other pertinent events   10/20 - [facility] ED for progressive SOB x 3 days. Admitted via TRH. 10/21 - PCCM consulted for pulmonary recommendations.  Interim History / Subjective:  Sleeping. Met with daughter, spouse. He is desatting significantly with movement in bed. Not really netter. Pretty sure will go home with hospice.  Objective:  Blood pressure 130/83, pulse 83, temperature (!) 97.5 F (36.4 C), temperature source Oral, resp. rate (!) 26, height 5' 10 (1.778 m), weight 83 kg, SpO2 99%.        Intake/Output Summary (Last 24 hours) at 08/12/2024 1837 Last data filed at 08/12/2024 1806 Gross per 24 hour  Intake 1518.28 ml  Output 2000 ml  Net -481.72 ml   Filed Weights   08/09/24 1445  Weight: 83 kg   Physical Examination: General: Chronically ill-appearing older man in NAD,  lying in bed HEENT: AT [place] CV: Regular rate and rhythm,  no murmur appreciated PULM: breathing while asleep GI: Nondistended  Resolved Hospital Problem List:    Assessment & Plan:   Acute on chronic hypoxemic respiratory failure: Acute worsening presumed ascending pneumonia as demonstrated on CT scan.  Chronicity related  to ILD and VQ mismatch in the setting of pulmonary hypertension.  Notably, procalcitonin is negative - suspect aspiration as source of infiltrates.  Pro BNP is not elevated. 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  Gradually worsening on imaging over time of worsening on glass opacities.  Denies frank aspiration symptoms.  Does have breakthrough GERD symptoms. -- IV steroids x 72 hours then 40 mg daily for 5 days, 20 mg daily for 5 days, 10 mg daily for 5 days, then stop - given no significant improvement with steroids will pursue relatively short taper -- PPI p.o. 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  Possible worsen over time and gradual fluid overload accounts for subacute to chronic worsening of oxygenation over the last 6 to 8 months. --Aggressive IV diuresis, pro BNP within normal limits  Labs:  CBC: Recent Labs  Lab 08/09/24 1516 08/10/24 0622  WBC 9.7 8.3  NEUTROABS 8.4*  --   HGB 13.3 12.4*  HCT 40.8 38.8*  MCV 95.1 95.1  PLT 245 219   Basic Metabolic Panel: Recent Labs  Lab 08/09/24 1516 08/10/24 0622 08/12/24 0728  NA 132* 133* 133*  K 4.7 4.5 4.5  CL 94* 95* 95*  CO2 30 32 30  GLUCOSE 99 86 134*  BUN 17 15 29*  CREATININE 0.61 0.73 0.75  CALCIUM  9.4 9.0 9.2   GFR: Estimated Creatinine Clearance: 91.3 mL/min (by C-G formula based on SCr of 0.75 mg/dL). Recent Labs  Lab 08/09/24 1516 08/10/24 0622 08/10/24 1806  PROCALCITON  --   --  <0.10  WBC 9.7 8.3  --    Liver Function Tests: Recent Labs  Lab 08/09/24 1516 08/10/24 0622  AST 34 26  ALT 27 22  ALKPHOS 68 57  BILITOT 0.4 0.4  PROT 7.9 7.0  ALBUMIN 4.0 3.5   No results for input(s): LIPASE, AMYLASE in the last  168 hours. No results for input(s): AMMONIA in the last 168 hours.  ABG:    Component Value Date/Time   PHART 7.445 06/15/2021 2045   PCO2ART 34.0 06/15/2021 2045   PO2ART 96.6 06/15/2021 2045   HCO3 31.0 (H) 01/23/2024 1020   HCO3 32.0 (H) 01/23/2024 1020   TCO2 33 (H) 01/23/2024 1020   TCO2 34 (H) 01/23/2024 1020   O2SAT 75 01/23/2024 1020   O2SAT 72 01/23/2024 1020    Coagulation Profile: No results for input(s): INR, PROTIME in the last 168 hours.  Cardiac Enzymes: No results for input(s): CKTOTAL, CKMB, CKMBINDEX, TROPONINI in the last 168 hours.  HbA1C: Hgb A1c MFr Bld  Date/Time Value Ref Range Status  09/27/2021 09:29 AM 6.2 4.6 - 6.5 % Final    Comment:    Glycemic Control Guidelines for People with Diabetes:Non Diabetic:  <6%Goal of Therapy: <7%Additional Action Suggested:  >8%   07/12/2021 09:54 AM 5.7 4.6 - 6.5 % Final    Comment:    Glycemic Control Guidelines for People with Diabetes:Non Diabetic:  <6%Goal of Therapy: <7%Additional  Action Suggested:  >8%    CBG: No results for input(s): GLUCAP in the last 168 hours.  Review of Systems:   N/a  Past Medical History:  He,  has a past medical history of Asthma, GERD (gastroesophageal reflux disease), High cholesterol, History of blood transfusion, Hypothyroidism, Interstitial lung disease (HCC), Multiple myeloma (HCC), Perennial allergic rhinitis, Pneumonia, Sciatic pain, right, Seasonal allergic rhinitis, and Thyroid  disease.   Surgical History:   Past Surgical History:  Procedure Laterality Date   BRONCHIAL BIOPSY  06/18/2021   Procedure: BRONCHIAL BIOPSIES;  Surgeon: Shelah Lamar RAMAN, MD;  Location: [facility] ENDOSCOPY;  Service: Cardiopulmonary;;   BRONCHIAL BIOPSY  08/28/2021   Procedure: BRONCHIAL BIOPSIES;  Surgeon: Brenna Adine CROME, DO;  Location: [facility] ENDOSCOPY;  Service: Cardiopulmonary;;   BRONCHIAL WASHINGS  06/18/2021   Procedure: BRONCHIAL WASHINGS;  Surgeon: Shelah Lamar RAMAN, MD;  Location:  [facility] ENDOSCOPY;  Service: Cardiopulmonary;;   BRONCHIAL WASHINGS  08/28/2021   Procedure: BRONCHIAL WASHINGS;  Surgeon: Brenna Adine CROME, DO;  Location: [facility] ENDOSCOPY;  Service: Cardiopulmonary;;   NASAL SINUS SURGERY     NECK SURGERY  1996   RIGHT HEART CATH N/A 01/23/2024   Procedure: RIGHT HEART CATH;  Surgeon: Elmira Newman PARAS, MD;  Location: [facility] INVASIVE CV LAB;  Service: Cardiovascular;  Laterality: N/A;   VIDEO BRONCHOSCOPY N/A 06/18/2021   Procedure: VIDEO BRONCHOSCOPY WITH FLUORO;  Surgeon: Shelah Lamar RAMAN, MD;  Location: [facility]OS[facility];  Service: Cardiopulmonary;  Laterality: N/A;   VIDEO BRONCHOSCOPY N/A 08/28/2021   Procedure: VIDEO BRONCHOSCOPY WITH FLUORO;  Surgeon: Brenna Adine CROME, DO;  Location: [facility] ENDOS[facility];  Service: Cardiopulmonary;  Laterality: N/A;    Social History:   reports that he quit smoking about 41 years ago. His smoking use included cigarettes. He started smoking about 44 years ago. He has a 0.3 pack-year smoking history. He has never used smokeless tobacco. He reports that he does not currently use alcohol. He reports that he does not use drugs.   Family History:  His family history includes Allergies in his father and mother; Breast cancer in his paternal grandmother; CAD (age of onset: 19) in his father; Heart disease in an other family member; Hypertension in his mother and another family member.   Allergies: Allergies  Allergen Reactions   Amlodipine  Swelling   Beclomethasone Other (See Comments)    unknown   Ciclesonide Other (See Comments)    unknown   Clarithromycin Other (See Comments)    Hiccups   Ofev  [Nintedanib] Other (See Comments)    GI bleeding   Penicillins Other (See Comments)    Child hood unsure   Rosuvastatin      Other Reaction(s): hand swelling   Trazodone     Other Reaction(s): sweating   Olga [Treprostinil Sodium] Cough    Bronchial spasms, sore throat.   Home Medications: Prior to Admission medications   Medication Sig  Start Date End Date Taking? Authorizing Provider  acyclovir  (ZOVIRAX ) 400 MG tablet TAKE ONE TABLET BY MOUTH TWICE DAILY 06/23/24  Yes Federico Norleen ONEIDA MADISON, MD  albuterol  (VENTOLIN  HFA) 108 (90 Base) MCG/ACT inhaler Inhale 2 puffs into the lungs every 6 hours as needed for wheezing or shortness of breath. 06/11/24  Yes Federico Norleen ONEIDA MADISON, MD  azelastine  (ASTELIN ) 0.1 % nasal spray Place 2 sprays into both nostrils 2 (two) times daily. Use in each nostril as directed 10/07/23  Yes Soldatova, Elena, MD  azithromycin  (ZITHROMAX ) 250 MG tablet 250mg  once daily on  Monday Wed  Friday 07/30/24  Yes Geronimo Amel, MD  bisoprolol  (ZEBETA ) 5 MG tablet Take 1 tablet (5 mg total) by mouth daily. 10/17/21  Yes Geronimo Amel, MD  Budeson-Glycopyrrol-Formoterol (BREZTRI  AEROSPHERE) 160-9-4.8 MCG/ACT AERO Inhale 2 puffs into the lungs in the morning and at bedtime. 10/13/23  Yes Parrett, Tammy S, NP  cetirizine  (ZYRTEC ) 10 MG tablet Take 1 tablet (10 mg total) by mouth daily. 10/07/23  Yes Soldatova, Liuba, MD  cholecalciferol (VITAMIN D) 1000 UNITS tablet Take 3,000 Units by mouth daily.   Yes [provider]  dextromethorphan (DELSYM) 30 MG/5ML liquid Take 30 mg by mouth as needed for cough.   Yes [provider]  fluticasone  (FLONASE) 50 MCG/ACT nasal spray Place 1 spray into both nostrils daily as needed for allergies or rhinitis.   Yes [provider]  furosemide  (LASIX ) 40 MG tablet Take 1 tablet (40 mg total) by mouth daily as needed. 06/10/24  Yes Geronimo Amel, MD  HYDROcodone  bit-homatropine (HYCODAN) 5-1.5 MG/5ML syrup Take 5 mLs by mouth every 4 (four) hours as needed for cough. 07/11/24  Yes Geronimo Amel, MD  ibuprofen (ADVIL) 200 MG tablet Take 200 mg by mouth every 6 (six) hours as needed for moderate pain (pain score 4-6).   Yes [provider]  levothyroxine  (SYNTHROID ) 50 MCG tablet Take 50 mcg by mouth every morning. 11/14/23  Yes [provider]   montelukast  (SINGULAIR ) 10 MG tablet Take 10 mg by mouth at bedtime.   Yes [provider]  Multiple Vitamin (MULTIVITAMIN) tablet Take 1 tablet by mouth daily.   Yes [provider]  omeprazole  (PRILOSEC) 40 MG capsule Take 1 capsule (40 mg total) by mouth 2 (two) times daily. Take 30 minutes before a meal 05/11/24  Yes Soldatova, Liuba, MD  OXYGEN Inhale 2 L into the lungs continuous.   Yes [provider]  potassium chloride  SA (KLOR-CON  M20) 20 MEQ tablet Take 1 tablet (20 mEq total) by mouth daily. 06/10/24  Yes Geronimo Amel, MD  sodium chloride  (OCEAN) 0.65 % SOLN nasal spray Place 1 spray into both nostrils as needed for congestion.   Yes [provider]  tamsulosin  (FLOMAX ) 0.4 MG CAPS capsule Take 0.4 mg by mouth daily.   Yes [provider]  thyroid  (ARMOUR) 90 MG tablet Take 90 mg by mouth daily. 06/23/24  Yes [provider]  triamcinolone  cream (KENALOG ) 0.1 % Apply 1 application topically daily as needed (sun burn itch). 01/15/21  Yes [provider]  Turmeric 400 MG CAPS Take 1 capsule by mouth daily.   Yes [provider]  albuterol  (PROVENTIL ) (2.5 MG/3ML) 0.083% nebulizer solution USE ONE VIAL IN NEBULIZER EVERY 6 HOURS AS NEEDED WHEEZING OR SHORTNESS OF BREATH Patient not taking: No sig reported 09/12/23   Geronimo Amel, MD  ALPRAZolam  (XANAX ) 0.5 MG tablet Take 0.5 mg by mouth at bedtime.    [provider]  ASSESS FULL RANGE PEAK METER DEVI as directed. Patient not taking: No sig reported 07/15/11   [provider]  EPINEPHrine  (EPI-PEN) 0.3 mg/0.3 mL DEVI Inject 0.3 mg into the muscle as needed. Patient not taking: No sig reported    [provider]  ezetimibe -simvastatin  (VYTORIN ) 10-40 MG tablet Take 1 tablet by mouth daily. 07/27/24   Patwardhan, Newman PARAS, MD  fexofenadine (ALLEGRA) 180 MG tablet Take 180 mg by mouth daily. Patient not taking: No sig reported    [provider]  ketoconazole (NIZORAL) 2 % cream Apply 1 Application  topically 2 (two) times daily as needed for irritation. Patient not taking: Reported on 08/10/2024 09/21/21   [provider]  predniSONE  (DELTASONE ) 10 MG tablet Take 1 tablet (10 mg total) by mouth daily with breakfast. Patient not taking: No sig reported 05/14/24   Geronimo Amel, MD  sildenafil (REVATIO) 20 MG tablet Take 20 mg by mouth daily as needed (ED). Patient not taking: No sig reported 07/12/19   [provider]  Simethicone  (SIMETHICONE  ULTRA STRENGTH) 180 MG CAPS Take 1 capsule (180 mg total) by mouth 3 (three) times daily as needed. Patient not taking: No sig reported 01/06/22   Theadore Ozell HERO, MD  Treprostinil ([name]) 0.6 MG/ML SOLN Inhale into the lungs 4 (four) times daily. Inhale 3 breaths per treatment session four times daily. Increase weekly to goal dose 9 to 12 breaths per treatment session four times daily Patient not taking: No sig reported    [provider]    Signature:   Donnice JONELLE Beals, MD [facility] 08/12/24 6:37 PM  Please see [email or web address] for pager details.  From 7A-7P if no response, please call [phone] After hours, please call ELink [phone]

## 2024-08-12 NOTE — Progress Notes (Signed)
   08/12/24 1140  Spiritual Encounters  Type of Visit Initial  Care provided to: Pt and family  Reason for visit Urgent spiritual support  OnCall Visit No   Visited with patient and daughter, who was at bedside. Had a long discussion about hospice. Patient seems to be concern as to how long hospice would last as he plans to be up and about.  We discussed making memories with grandchildren as he didn't want them to see him like this as it might scare them. Daughter expressed desire for grand children to visit with patient once he is home. Patient stated wife is making arrangements for him and he thinks he will only need hospice care once a week, seems to be some confusion involved. He seems to think that once the pneumonia clears his lungs will be restored to what they were a year ago. Patient also expressed that he is depressed. Asked patient is he would like to talk alone. Patient stated that he wanted to talk with his daughter present, however he was very sleepy and asked that a chaplain return to visit with him this afternoon to continue discussion.

## 2024-08-12 NOTE — TOC Progression Note (Signed)
 Transition of Care [facility]) - Progression Note   Patient Details  Name: Samuel [name] MRN: [id number] Date of Birth: 1956-08-02  Transition of [facility] CM/SW Contact  Duwaine GORMAN Aran, LCSW Phone Number: 08/12/2024, 11:15 AM  Clinical Narrative: Care management consulted for home hospice. CSW met with patient's wife, [name], while patient appeared to be resting. Wife reported she would like [facility] for hospice services. Wife confirmed patient is already receiving home oxygen through [employer] and has a 10L concentrator at home. Patient will also need a hospital bed. CSW made home hospice referral to Melissa/Misty with [facility]. Care management awaiting DME delivery.  Expected Discharge Plan: Home w Hospice Care Barriers to Discharge: Continued Medical Work up, Other (must enter comment) (Hospice DME delivery)  Expected Discharge Plan and Services In-house Referral: Clinical Social Work Post Acute Care Choice: Hospice Living arrangements for the past 2 months: Single Family Home  Social Drivers of Health (SDOH) Interventions SDOH Screenings   Food Insecurity: No Food Insecurity (08/10/2024)  Housing: Low Risk  (08/10/2024)  Transportation Needs: No Transportation Needs (01/14/2024)   Received from [facility]  Utilities: Low Risk  (01/14/2024)   Received from [facility]  Depression (PHQ2-9): Low Risk  (02/03/2024)  Financial Resource Strain: Low Risk  (03/15/2022)   Received from [facility]  Physical Activity: Insufficiently Active (01/10/2022)   Received from [facility]  Social Connections: Socially Integrated (01/10/2022)   Received from [facility]  Stress: No Stress Concern Present (01/10/2022)   Received from [facility]  Tobacco Use: Medium Risk (08/11/2024)   Readmission Risk Interventions     No data to display

## 2024-08-12 NOTE — Progress Notes (Signed)
 [facility] Liaison Note  Received request from [facility] for hospice services at home after discharge. Spoke with patient and wife to initiate education related to hospice philosophy, services and team approach to care. Both verbalized understanding of information given. Per discussion, the plan is for discharge home once DME is delivered.  DME needs discussed. Patient has the following equipment in the home: oxygen, wheelchair, transport chair Family requests the following equipment for delivery: hospital bed, BSC, over bed table  Please send signed and completed DNR home with patient/family. Please provide prescriptions at discharge as needed to ensure ongoing symptom management.  [facility] information and contact numbers given to patient. Please call with any concerns.  Thank you for the opportunity to participate in this patient's care.   Eleanor Nail, LPN [facility] Liaison [phone]

## 2024-08-13 ENCOUNTER — Ambulatory Visit (INDEPENDENT_AMBULATORY_CARE_PROVIDER_SITE_OTHER): Admitting: Otolaryngology

## 2024-08-13 DIAGNOSIS — J8489 Other specified interstitial pulmonary diseases: Secondary | ICD-10-CM | POA: Diagnosis not present

## 2024-08-13 DIAGNOSIS — J9621 Acute and chronic respiratory failure with hypoxia: Secondary | ICD-10-CM | POA: Diagnosis not present

## 2024-08-13 LAB — BASIC METABOLIC PANEL WITH GFR
Anion gap: 11 (ref 5–15)
BUN: 30 mg/dL — ABNORMAL HIGH (ref 8–23)
CO2: 32 mmol/L (ref 22–32)
Calcium: 9 mg/dL (ref 8.9–10.3)
Chloride: 95 mmol/L — ABNORMAL LOW (ref 98–111)
Creatinine, Ser: 0.74 mg/dL (ref 0.61–1.24)
GFR, Estimated: >60 mL/min (ref >60)
Glucose, Bld: 132 mg/dL — ABNORMAL HIGH (ref 70–99)
Potassium: 4 mmol/L (ref 3.5–5.1)
Sodium: 138 mmol/L (ref 135–145)

## 2024-08-13 NOTE — Progress Notes (Signed)
 [facility] Liaison Note   [facility] continues to follow for discharge disposition for hospice services at home. Additional O2 concentrator, oxymizer pendant and non-rebreather mask added to DME order.   Please call with any hospice related questions or concerns.   Eleanor Nail, LPN [facility] Liaison [phone]

## 2024-08-13 NOTE — Progress Notes (Signed)
 Daily Progress Note   Patient Name: Samuel [name]       Date: 08/13/2024 DOB: 1955/10/25  Age: 68 y.o. MRN#: [id number] Attending Physician: [place], Eric J, DO Primary Care Physician: Samuel Lenis, MD Admit Date: 08/09/2024  Reason for Consultation/Follow-up: Establishing goals of care, Non pain symptom management, and Pain control  Subjective: Chart Reviewed. Updates Received. Patient awake and alert. No acute distress. His daughter is present at bedside. Samuel [name] and family shares recent discussions with hospice's nurse Liaison with current plans underway to arrange home for his arrival in the upcoming days. He is looking forward to returning home. His wife is home preparing their bedroom pending hospital bed delivery. 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 We were able to include her in updated discussions via speakerphone.   Samuel [name] is interested in having a Purewick in the home. We will attempt to locate a system for him for home use and comfort.   Samuel [name] goals remain clear for him to be in the home with his family focusing on his comfort and taking things one day at a time with hospice support.   We reviewed MOST form and completed documents outlining his wishes.   The patient and family outlined their wishes for the following treatment decisions:  Cardiopulmonary Resuscitation: Do Not Attempt Resuscitation (DNR/No CPR)  Medical Interventions: Comfort Measures: Keep clean, warm, and dry. Use medication by any route, positioning, wound care, and other measures to relieve pain and suffering. Use oxygen, suction and manual treatment of airway obstruction as needed for comfort. Do not transfer to the hospital unless comfort needs cannot be met in current location.  Antibiotics: Antibiotics if indicated  IV Fluids: No IV fluids (provide other measures to ensure comfort)  Feeding Tube: No  feeding tube      Length of Stay: 4 days  Vital Signs: BP 132/76 (BP Location: Left Arm)   Pulse 71   Temp 97.8 F (36.6 C)   Resp 20   Ht 5' 10 (1.778 m)   Wt 83 kg Comment: Bed scale  SpO2 98%   BMI 26.26 kg/m  SpO2: SpO2: 98 % O2 Device: O2 Device: Nasal Cannula O2 Flow Rate: O2 Flow Rate (L/min): (S) 6 L/min Last Weight  Most recent update: 08/09/2024  2:45 PM    Weight  83 kg (183 lb)             Intake/Output Summary (Last 24 hours) at 08/13/2024 1857 Last data filed at 08/13/2024 1700 Gross per 24 hour  Intake 1403.66 ml  Output 2300 ml  Net -896.34 ml    Physical Exam: Gen:  Chronically-ill appearing  HEENT: moist mucous membranes CV: Tachycardia  PULM: Diminished, dyspneic with minimal exertion, oxygen via nasal cannula ABD: soft/nontender/nondistended/normal bowel sounds EXT: No edema Neuro: Alert and oriented x3  Palliative Care Assessment & Plan  YEP:Ejoopjupcz Care consult requested for goals of care discussion in this 68 y.o. male  with past medical history of multiple myeloma with multiple lytic lesions to thoracolumbar spine and bilateral pelvis, extensive pulmonary fibrosis, GERD, hypothyroidism, HLD, and asthma. He was admitted on 08/09/2024 via EMS with worsening shortness of breath. Required 10 L via nasal cannula later due to distress escalated to high flow. Work-up suspicious for bilateral lower lobe aspiration and worsening interstitial disease. Influenza, COVID, and RSV negative.   Code Status: DNR  Recommendations/Plan: DNR/DNI Continue  with current plan of care per medical team. Continue IV antibiotics over next 24-48 hours.  Extensive goals of care discussions. See above MOST: DNR completed. MOST form given to family, DNR on file for home transport.  Samuel [name] outpatient hospice working with family for home equipment delivery and arrangements. Patient will require home transportation, hospital bed, oxygen system, and urinary  drainage system.  Spiritual care referral and counseling. Will discuss with cancer center counseling team.  Patient will need all comfort meds prior to discharge via meds to bed  PMT will continue to support and follow. Please secure chat for urgent unmet palliative needs.  Symptom Management: Pain  Morphine 2mg  every 2 hours as needed Dyspnea Solu-Medrol  40mg  every 8 hours Morphine 2mg  every 2 hours as needed Proventil  every 2 hours as needed Continue use of oxygen for comfort and support Nausea Zofran  4mg  every 6 hours as needed Anxiety Xanax  1mg  every 8 hours  Hydroxyzine 25mg  every 6 hours as needed   Thank you for allowing the Palliative Medicine Team to assist in the care of this patient.  Palliative Medicine Team providers are available by phone from 7am to 7pm daily and can be reached through the team cell phone. Should this patient require assistance outside of these hours, please call the patient's attending physician.  Any controlled substances utilized were prescribed in the context of palliative care. PDMP has been reviewed.  I personally spent a total of 55 minutes in the care of the patient today including preparing to see the patient, getting/reviewing separately obtained history, performing a medically appropriate exam/evaluation, counseling and educating, placing orders, referring and communicating with other health care professionals, documenting clinical information in the EHR, independently interpreting results, communicating results, and coordinating care. Visit consisted of counseling and education dealing with the complex and emotionally intense issues of symptom management and palliative care in the setting of serious and potentially life-threatening illness.  Samuel [name], AGPCNP-BC  [facility]  [phone]  *Please note that this is a verbal dictation therefore any spelling or grammatical errors are due  to the Dragon Medical One system interpretation.

## 2024-08-13 NOTE — Progress Notes (Signed)
 PROGRESS NOTE    Samuel [name]  FMW:[id number] DOB: 10-11-1956 DOA: 08/09/2024 PCP: Seabron Lenis, MD    Brief Narrative:   Samuel [name] is a 68 y.o. male with past medical history significant for advanced interstitial lung disease (on 6-8L [place] at baseline), hypothyroidism, asthma, multiple myeloma, GERD who presented to [facility] ED on 08/09/2024 with progressive shortness of breath over the last 3 days.  Additionally reports progressive weakness since February 2025.  In the ED, temperature 99.0 F, HR 64, RR 32, BP 157/91, SpO2 93% on 6 L nasal cannula and subsequent placed on 15 L nonrebreather.  WBC 9.7, hemoglobin 13.3, platelet count 245.  Sodium 132, potassium 4.7, chloride 94, CO2 30, glucose 99, BUN 17, creatinine 0.61.  AST 34, ALT 27, total bilirubin 0.4.  COVID/influenza/RSV PCR negative.  Respiratory viral panel negative.  Chest x-ray with continued findings of pulmonary fibrosis with low lung volumes, increased ground glass opacities within the lungs.  CT angiogram chest PE with limited exam but no pulmonary embolism noted, bilateral lower lobe consolidation, small pericardial effusion, mild diffuse pulmonary parenchymal ground glass, underlying interstitial disease.  TRH was consulted for admission for further evaluation management of acute on chronic hypoxic respiratory failure secondary to ILD exacerbation.  Assessment & Plan:   Acute on chronic hypoxic respiratory failure, POA Acute exacerbation of interstitial lung disease Patient presenting with progressive shortness of breath, weakness.  At baseline on 6-8 L nasal cannula at rest, has been requiring up to 10-15 L with minimal exertion at home.  Followed by pulmonology, Dr. Geronimo outpatient.  Has failed multiple ILD agents including antifibrotic's and has not tolerated pulmonary HTN medications.  Was referred to [facility] for lung transplant but not a candidate due to multiple myeloma.  Patient afebrile without leukocytosis.   CT imaging on admission notable for increased ground glass opacities, likely secondary to worsening fibrosis/inflammatory response. -- Pulmonology following, appreciate assistance -- Doxycycline 100 mg IV every 12 hours -- Ceftriaxone 1 g IV every 24 hours -- Solu-Medrol  40 mg IV q8h -- Furosemide  40 mg IV every 12 hours -- Montelukast  10 mg p.o. nightly -- Supportive care, overall prognosis poor given progression of disease with anticipated discharge to hospice at home; anticipated for tomorrow  HTN -- Bisoprolol  5 mg p.o. daily  Anxiety -- Xanax  1 mg p.o. every 8 hours  Hypothyroidism -- Armour Thyroid  90 mg p.o. daily -- Levothyroxine  50 mcg p.o. daily  Multiple myeloma Follows with medical oncology, Dr. Federico outpatient.  Also follows with palliative care.  GERD -- Protonix  40 m p.o. twice daily  BPH -- Tamsulosin  0.4 mg p.o. daily  Generalized weakness, debility, deconditioning Ongoing since February 2025 which has been progressive.  Likely related directly to his progressive interstitial lung disease as above.  -- PT/OT evaluation  DVT prophylaxis: enoxaparin  (LOVENOX ) injection 40 mg Start: 08/09/24 2200    Code Status: Limited: Do not attempt resuscitation (DNR) -DNR-LIMITED -Do Not Intubate/DNI  Family Communication: Updated daughter present at bedside this morning  Disposition Plan:  Level of care: Progressive Status is: Inpatient Remains inpatient appropriate because: IV steroids, overall prognosis poor with planned discharge to home hospice, tomorrow    Consultants:  PCCM  Procedures:  None  Antimicrobials:  Doxycycline 10/20>> Ceftriaxone 10/20>>   Subjective: Patient seen examined bedside, lying in bed.  Daughter, spouse; RN present at bedside.  Continues with dyspnea, remains on 7 L high flow nasal cannula which is at baseline but reports much increased dyspnea when  exerts himself minimally.  Discussed with patient and family regarding overall  prognosis remains poor given his advanced and progressive interstitial lung disease with failed outpatient medication trials and progressive weakness with increased oxygen needs.  Discussed with pulmonology, Dr. Annella who recommended additional concentrator to use at home with nonrebreather mask when he discharges home with hospice.  Discussed with patient and family anticipated discharge home tomorrow.  Asking to work with therapy.  No other questions or concerns at this time.  Denies headache, no dizziness, no chest pain, no fever/chills/night sweats, no nausea/vomiting/diarrhea, no abdominal pain, no focal weakness, no paresthesias.  No acute events overnight per nursing staff.  Objective: Vitals:   08/13/24 0256 08/13/24 0451 08/13/24 0806 08/13/24 1005  BP: 139/88 (!) 141/99  (!) 144/78  Pulse: 66 64  77  Resp:    20  Temp: (!) 97.5 F (36.4 C) 97.7 F (36.5 C)  (!) 97.5 F (36.4 C)  TempSrc: Oral Oral  Oral  SpO2: 97% 100% 100% 100%  Weight:      Height:        Intake/Output Summary (Last 24 hours) at 08/13/2024 1211 Last data filed at 08/13/2024 0630 Gross per 24 hour  Intake 1708.28 ml  Output 2500 ml  Net -791.72 ml   Filed Weights   08/09/24 1445  Weight: 83 kg    Examination:  Physical Exam: GEN: NAD, alert and oriented x 3, chronically ill in appearance, anxious HEENT: NCAT, PERRL, EOMI, sclera clear, MMM PULM: + Crackles, normal respiratory effort with accessory muscle use, on 8L [place] CV: RRR  GI: abd soft, NTND, + BS MSK: no peripheral edema, moves all extremity independently NEURO: No focal neurological deficit PSYCH: Anxious mood Integumentary: No concerning rashes/lesions/wounds none exposed skin surfaces    Data Reviewed: I have personally reviewed following labs and imaging studies  CBC: Recent Labs  Lab 08/09/24 1516 08/10/24 0622  WBC 9.7 8.3  NEUTROABS 8.4*  --   HGB 13.3 12.4*  HCT 40.8 38.8*  MCV 95.1 95.1  PLT 245 219   Basic  Metabolic Panel: Recent Labs  Lab 08/09/24 1516 08/10/24 0622 08/12/24 0728 08/13/24 0537  NA 132* 133* 133* 138  K 4.7 4.5 4.5 4.0  CL 94* 95* 95* 95*  CO2 30 32 30 32  GLUCOSE 99 86 134* 132*  BUN 17 15 29* 30*  CREATININE 0.61 0.73 0.75 0.74  CALCIUM  9.4 9.0 9.2 9.0   GFR: Estimated Creatinine Clearance: 91.3 mL/min (by C-G formula based on SCr of 0.74 mg/dL). Liver Function Tests: Recent Labs  Lab 08/09/24 1516 08/10/24 0622  AST 34 26  ALT 27 22  ALKPHOS 68 57  BILITOT 0.4 0.4  PROT 7.9 7.0  ALBUMIN 4.0 3.5   No results for input(s): LIPASE, AMYLASE in the last 168 hours. No results for input(s): AMMONIA in the last 168 hours. Coagulation Profile: No results for input(s): INR, PROTIME in the last 168 hours. Cardiac Enzymes: No results for input(s): CKTOTAL, CKMB, CKMBINDEX, TROPONINI in the last 168 hours. BNP (last 3 results) Recent Labs    09/12/23 1018 08/11/24 0543  PROBNP 35.0 181.0   HbA1C: No results for input(s): HGBA1C in the last 72 hours. CBG: No results for input(s): GLUCAP in the last 168 hours. Lipid Profile: No results for input(s): CHOL, HDL, LDLCALC, TRIG, CHOLHDL, LDLDIRECT in the last 72 hours. Thyroid  Function Tests: No results for input(s): TSH, T4TOTAL, FREET4, T3FREE, THYROIDAB in the last 72 hours. Anemia Panel: No  results for input(s): VITAMINB12, FOLATE, FERRITIN, TIBC, IRON, RETICCTPCT in the last 72 hours. Sepsis Labs: Recent Labs  Lab 08/10/24 1806  PROCALCITON <0.10    Recent Results (from the past 240 hours)  Resp panel by RT-PCR (RSV, Flu A&B, Covid) Anterior Nasal Swab     Status: None   Collection Time: 08/09/24  3:16 PM   Specimen: Anterior Nasal Swab  Result Value Ref Range Status   SARS Coronavirus 2 by RT PCR NEGATIVE NEGATIVE Final    Comment: (NOTE) SARS-CoV-2 target nucleic acids are NOT DETECTED.  The SARS-CoV-2 RNA is generally detectable in  upper respiratory specimens during the acute phase of infection. The lowest concentration of SARS-CoV-2 viral copies this assay can detect is 138 copies/mL. A negative result does not preclude SARS-Cov-2 infection and should not be used as the sole basis for treatment or other patient management decisions. A negative result may occur with  improper specimen collection/handling, submission of specimen other than nasopharyngeal swab, presence of viral mutation(s) within the areas targeted by this assay, and inadequate number of viral copies(<138 copies/mL). A negative result must be combined with clinical observations, patient history, and epidemiological information. The expected result is Negative.  Fact Sheet for Patients:  BloggerCourse.com  Fact Sheet for Healthcare Providers:  SeriousBroker.it  This test is no t yet approved or cleared by the [place]  FDA and  has been authorized for detection and/or diagnosis of SARS-CoV-2 by FDA under an Emergency Use Authorization (EUA). This EUA will remain  in effect (meaning this test can be used) for the duration of the COVID-19 declaration under Section 564(b)(1) of the Act, 21 U.S.C.section 360bbb-3(b)(1), unless the authorization is terminated  or revoked sooner.       Influenza A by PCR NEGATIVE NEGATIVE Final   Influenza B by PCR NEGATIVE NEGATIVE Final    Comment: (NOTE) The Xpert Xpress SARS-CoV-2/FLU/RSV plus assay is intended as an aid in the diagnosis of influenza from Nasopharyngeal swab specimens and should not be used as a sole basis for treatment. Nasal washings and aspirates are unacceptable for Xpert Xpress SARS-CoV-2/FLU/RSV testing.  Fact Sheet for Patients: BloggerCourse.com  Fact Sheet for Healthcare Providers: SeriousBroker.it  This test is not yet approved or cleared by the [place]  FDA and has been  authorized for detection and/or diagnosis of SARS-CoV-2 by FDA under an Emergency Use Authorization (EUA). This EUA will remain in effect (meaning this test can be used) for the duration of the COVID-19 declaration under Section 564(b)(1) of the Act, 21 U.S.C. section 360bbb-3(b)(1), unless the authorization is terminated or revoked.     Resp Syncytial Virus by PCR NEGATIVE NEGATIVE Final    Comment: (NOTE) Fact Sheet for Patients: BloggerCourse.com  Fact Sheet for Healthcare Providers: SeriousBroker.it  This test is not yet approved or cleared by the [place]  FDA and has been authorized for detection and/or diagnosis of SARS-CoV-2 by FDA under an Emergency Use Authorization (EUA). This EUA will remain in effect (meaning this test can be used) for the duration of the COVID-19 declaration under Section 564(b)(1) of the Act, 21 U.S.C. section 360bbb-3(b)(1), unless the authorization is terminated or revoked.  Performed at [facility], [address], [place], [place] [address]   Respiratory (~20 pathogens) panel by PCR     Status: None   Collection Time: 08/11/24 11:01 AM   Specimen: Nasopharyngeal Swab; Respiratory  Result Value Ref Range Status   Adenovirus NOT DETECTED NOT DETECTED Final   Coronavirus 229E  NOT DETECTED NOT DETECTED Final    Comment: (NOTE) The Coronavirus on the Respiratory Panel, DOES NOT test for the novel  Coronavirus (2019 nCoV)    Coronavirus HKU1 NOT DETECTED NOT DETECTED Final   Coronavirus NL63 NOT DETECTED NOT DETECTED Final   Coronavirus OC43 NOT DETECTED NOT DETECTED Final   Metapneumovirus NOT DETECTED NOT DETECTED Final   Rhinovirus / Enterovirus NOT DETECTED NOT DETECTED Final   Influenza A NOT DETECTED NOT DETECTED Final   Influenza B NOT DETECTED NOT DETECTED Final   Parainfluenza Virus 1 NOT DETECTED NOT DETECTED Final   Parainfluenza Virus 2 NOT DETECTED NOT  DETECTED Final   Parainfluenza Virus 3 NOT DETECTED NOT DETECTED Final   Parainfluenza Virus 4 NOT DETECTED NOT DETECTED Final   Respiratory Syncytial Virus NOT DETECTED NOT DETECTED Final   Bordetella pertussis NOT DETECTED NOT DETECTED Final   Bordetella Parapertussis NOT DETECTED NOT DETECTED Final   Chlamydophila pneumoniae NOT DETECTED NOT DETECTED Final   Mycoplasma pneumoniae NOT DETECTED NOT DETECTED Final    Comment: Performed at [facility], [address] [place], [place] [address]         Radiology Studies: No results found.      Scheduled Meds:  ALPRAZolam   1 mg Oral Q8H   azelastine   2 spray Each Nare BID   bisoprolol   5 mg Oral Daily   docusate sodium  100 mg Oral BID   enoxaparin  (LOVENOX ) injection  40 mg Subcutaneous Q24H   fluticasone   1 spray Each Nare BID   furosemide   40 mg Intravenous Q12H   ipratropium-albuterol   3 mL Nebulization BID   levothyroxine   50 mcg Oral Q0600   methylPREDNISolone  (SOLU-MEDROL ) injection  40 mg Intravenous Q8H   montelukast   10 mg Oral QHS   pantoprazole   40 mg Oral BID   tamsulosin   0.4 mg Oral Daily   thyroid   90 mg Oral Daily   Continuous Infusions:  cefTRIAXone (ROCEPHIN)  IV 1 g (08/12/24 2303)   doxycycline (VIBRAMYCIN) IV 100 mg (08/13/24 0610)     LOS: 4 days    Time spent: 52 minutes spent on 08/13/2024 caring for this patient face-to-face including chart review, ordering labs/tests, documenting, discussion with nursing staff, consultants, updating family and interview/physical exam    Camellia PARAS [place], DO [facility] Available via Epic secure chat 7am-7pm After these hours, please refer to coverage provider listed on [email or web address] 08/13/2024, 12:11 PM

## 2024-08-13 NOTE — Evaluation (Signed)
 Occupational Therapy Evaluation and Discharge Patient Details Name: Samuel [name] MRN: [id number] DOB: 03-Jul-1956 Today's Date: 08/13/2024   History of Present Illness   Samuel [name] is a 68 y.o. male with past medical history significant for advanced interstitial lung disease (on 6-8L [place] at baseline), hypothyroidism, asthma, multiple myeloma, GERD who presented to [facility] ED on 08/09/2024 with progressive shortness of breath over the last 3 days.  Additionally reports progressive weakness since February 2025     Clinical Impressions Pt presents as requiring min A to sit EOB, STS and transfers using RW, extensive assist with ADLs/self care due to poor activity tolerance, DOE with minimal exertion, Pt anxious about mobility but willing to try. PTA pt lives at home with his wife and at baseline used 6-8L O2, requires assist with ADLs/selfcare and used a w/c for mobility. Pt reports that he desats quickly and that ADL tasks requires increased time to complete. Throughout activity, pt anxious about anticipation of O2 SATs dropping and wanting O2 increased although pulse ox in mid-upper 90s during sup-sit. O2 SATs 88-97% during this activity/exertion with OT. Pt requested RN come in and increase O2 to 8L and then later to 10L once back in bed due to SATs dropping to 82%. O2 SATs recovered to mid 90s and O2 decreased to 8L. Pt will continue to require assist with ADLs/selfcare tasks and mobility. Pt declined to sit up in chair at end of session. At this time, pt is not appropriate nor can tolerate skilled OT services. Recommend hospital bed and [facility] for home with continued ADL assist. OT will sign off     If plan is discharge home, recommend the following:   A lot of help with bathing/dressing/bathroom;A lot of help with walking and/or transfers;Assist for transportation;Help with stairs or ramp for entrance     Functional Status Assessment   Patient has not had a recent decline in their  functional status     Equipment Recommendations   Hospital bed;BSC/3in1     Recommendations for Other Services         Precautions/Restrictions   Precautions Precautions: Fall;Other (comment) Precaution/Restrictions Comments: currently on 6L O2 at rest, desats with minimal exertion, 6-8L O2 at home at baseline Restrictions Weight Bearing Restrictions Per Provider Order: No     Mobility Bed Mobility Overal bed mobility: Needs Assistance Bed Mobility: Supine to Sit, Sit to Supine     Supine to sit: Min assist, HOB elevated, Used rails Sit to supine: Min assist   General bed mobility comments: increased time, pt anxious about O2 SATs dropping and wanting O2 increased although pulse ox in mid-upper 90s during sup-sit    Transfers Overall transfer level: Needs assistance Equipment used: Rolling walker (2 wheels) Transfers: Sit to/from Stand, Bed to chair/wheelchair/BSC Sit to Stand: Min assist           General transfer comment: increased time, pt anxious about O2 SATs dropping and wanting O2 increased although pulse ox in mid-upper 90s during sit-stand and pvot to chair. pt declined to sit in chair and with increase anxiety as O2 SATs dropped to 88-89%, Pt SPT back to sitting bed with increased time to return to supine min A. Total A +2 using pad underneath to scoot pt up in bed      Balance Overall balance assessment: Needs assistance Sitting-balance support: No upper extremity supported, Feet supported Sitting balance-Leahy Scale: Fair  ADL either performed or assessed with clinical judgement   ADL Overall ADL's : At baseline;Needs assistance/impaired                                       General ADL Comments: extensive assist at baseline with ADLs/selfcare due to respiratory issues     Vision Baseline Vision/History: 1 Wears glasses Ability to See in Adequate Light: 0 Adequate Patient  Visual Report: No change from baseline       Perception         Praxis         Pertinent Vitals/Pain Pain Assessment Pain Assessment: No/denies pain     Extremity/Trunk Assessment Upper Extremity Assessment Upper Extremity Assessment: Generalized weakness;Right hand dominant   Lower Extremity Assessment Lower Extremity Assessment: Defer to PT evaluation       Communication Communication Communication: No apparent difficulties   Cognition Arousal: Alert Behavior During Therapy: Flat affect, Anxious Cognition: No apparent impairments             OT - Cognition Comments: pt very anxious about mobility, fixates (understandably) on anticipation of desaturation with minimal mobility/exertion                 Following commands: Intact       Cueing  General Comments   Cueing Techniques: Verbal cues      Exercises     Shoulder Instructions      Home Living Family/patient expects to be discharged to:: Private residence Living Arrangements: Spouse/significant other Available Help at Discharge: Family Type of Home: House Home Access: Stairs to enter Secretary/administrator of Steps: 2-3   Home Layout: One level     Bathroom Shower/Tub: Producer, television/film/video: Handicapped height     Home Equipment: Information systems manager;Wheelchair - manual;Grab bars - tub/shower   Additional Comments: pt reports that e has a shower chair but does not use it      Prior Functioning/Environment Prior Level of Function : Needs assist             Mobility Comments: pt reports that he mistly uses a w/c but can walk a few feet to transfer to w/c ADLs Comments: wife assists him with ADLs/selfcare due to SOB, DOE, O2 SATs and anxiety, takes increased time to complete    OT Problem List: Decreased activity tolerance;Cardiopulmonary status limiting activity   OT Treatment/Interventions:        OT Goals(Current goals can be found in the care plan section)    Acute Rehab OT Goals Patient Stated Goal: get better   OT Frequency:       Co-evaluation              AM-PAC OT 6 Clicks Daily Activity     Outcome Measure Help from another person eating meals?: None Help from another person taking care of personal grooming?: A Little Help from another person toileting, which includes using toliet, bedpan, or urinal?: A Lot Help from another person bathing (including washing, rinsing, drying)?: A Lot Help from another person to put on and taking off regular upper body clothing?: A Lot Help from another person to put on and taking off regular lower body clothing?: A Lot 6 Click Score: 15   End of Session Equipment Utilized During Treatment: Gait belt;Rolling walker (2 wheels);Other (comment) (O2) Nurse Communication: Mobility status;Other (comment) (O2 SATs)  Activity Tolerance: Patient  limited by fatigue;Other (comment) (anxiety) Patient left: in bed;with call bell/phone within reach;with family/visitor present  OT Visit Diagnosis: Muscle weakness (generalized) (M62.81)                Time: 8982-8940 OT Time Calculation (min): 42 min Charges:  OT General Charges $OT Visit: 1 Visit OT Evaluation $OT Eval Moderate Complexity: 1 Mod OT Treatments $Therapeutic Activity: 23-37 mins    Jacques Karna Loose 08/13/2024, 12:57 PM

## 2024-08-13 NOTE — TOC Progression Note (Signed)
 Transition of Care [facility]) - Progression Note   Patient Details  Name: Samuel [name] MRN: [id number] Date of Birth: [birth year]  [facility] CM/SW Contact  Duwaine GORMAN Aran, LCSW Phone Number: 08/13/2024, 10:22 AM  Clinical Narrative: CSW notified by Eleanor with [facility] hospice that hospital bed has been delivered to home. However, patient will need a second home concentrator for home oxygen to use with NRB, so patient will not be able to discharge until tomorrow. Care management to follow.  Barriers to Discharge: Continued Medical Work up, Other (must enter comment) (Hospice DME delivery)  Expected Discharge Plan and Services In-house Referral: Clinical Social Work Post Acute Care Choice: Hospice Living arrangements for the past 2 months: Single Family Home  Social Drivers of Health (SDOH) Interventions SDOH Screenings   Food Insecurity: No Food Insecurity (08/10/2024)  Housing: Low Risk  (08/10/2024)  Transportation Needs: No Transportation Needs (01/14/2024)   Received from [facility]  Utilities: Low Risk  (01/14/2024)   Received from [facility]  Depression (PHQ2-9): Low Risk  (02/03/2024)  Financial Resource Strain: Low Risk  (03/15/2022)   Received from [facility]  Physical Activity: Insufficiently Active (01/10/2022)   Received from [facility]  Social Connections: Socially Integrated (01/10/2022)   Received from [facility]  Stress: No Stress Concern Present (01/10/2022)   Received from [facility]  Tobacco Use: Medium Risk (08/11/2024)   Readmission Risk Interventions     No data to display

## 2024-08-13 NOTE — Progress Notes (Signed)
 SPIRITUAL CARE AND COUNSELING CONSULT NOTE   VISIT SUMMARY I met with Samuel [name] and Samuel [name] to provide support around Samuel current hospitalization and what Samuel future holds.  He shared some theolgocial and life reflections including some information he had never shared with anyone related to Samuel past.  He is trying to understand why he is going through so much.  He shared that if Samuel imminent death is inevitable, that he wants to be made comfortable and allowed to die peacefully, but Samuel hope is that there is some hope that he can get back to Samuel most recent baseline and he is willing to work for that.  He wants to continue to process tomorrow because Samuel anxiety is very high and he feels that he doesn't know how to cope with the possibility that this might be the end.  Samuel [name] was present throughout the conversation and has been by Samuel side throughout this illness. We will continue to follow this patient, but please also page us  as needs arise.      SPIRITUAL ENCOUNTER                                                                                                                                                                      Type of Visit: Initial Care provided to:: Pt and family Conversation partners present during encounter: Physician, Nurse Referral source: APP Reason for visit: Urgent spiritual support OnCall Visit: No   SPIRITUAL FRAMEWORK  Presenting Themes: Meaning/purpose/sources of inspiration, Values and beliefs, Coping tools, Community and relationships, Impactful experiences and emotions Values/beliefs: He worries that this is a punishment from God Strengths: He has worked hard to care for himself and give himself as much quality of life as possible. Needs/Challenges/Barriers: He is waiting to see how he will feel once he completes the antibiotics. Patient Stress Factors: Health changes, Loss of control, Major life changes Family Stress Factors: Loss of control, Major life  changes   GOALS   Clinical Care Goals: He is very clear that if Samuel death is imminent and inevitable, that he would like it to be as pain-free as possible and he would like to be surrounded by family.   INTERVENTIONS   Spiritual Care Interventions Made: Established relationship of care and support, Compassionate presence, Reflective listening, Normalization of emotions, Decision-making support/facilitation, Reconciliation with self/others, Narrative/life review, Explored values/beliefs/practices/strengths, Meaning making, Bereavement/grief support, Prayer, Supported grief process    INTERVENTION OUTCOMES   Outcomes: Connection to spiritual care, Reduced fear  SPIRITUAL CARE PLAN   Spiritual Care Issues Still Outstanding: Chaplain will continue to follow    If immediate needs arise, please contact Samuel [name] / Behavioral Health 24 hour on call [phone]   Samuel [name], Chaplain  08/13/2024 1:02 AM

## 2024-08-13 NOTE — Care Management Important Message (Signed)
 Important Message  Patient Details  Name: Samuel [name] MRN: [id number] Date of Birth: [birth year]   Important Message Given:        Glade Cuff 08/13/2024, 2:36 PM

## 2024-08-13 NOTE — Progress Notes (Signed)
 PT Cancellation Note  Patient Details Name: Samuel [name] MRN: [id number] DOB: 04-20-1956   Cancelled Treatment:    Reason Eval/Treat Not Completed: Other (comment) Chaplain just entered room. Will check back tomorrow. Darice Potters PT Acute Rehabilitation Services Office [phone]   Potters Darice Norris 08/13/2024, 3:09 PM

## 2024-08-14 DIAGNOSIS — Z515 Encounter for palliative care: Secondary | ICD-10-CM

## 2024-08-14 DIAGNOSIS — J8489 Other specified interstitial pulmonary diseases: Secondary | ICD-10-CM | POA: Diagnosis not present

## 2024-08-14 DIAGNOSIS — J9621 Acute and chronic respiratory failure with hypoxia: Secondary | ICD-10-CM | POA: Diagnosis not present

## 2024-08-14 MED ORDER — PREDNISONE 10 MG PO TABS: ORAL_TABLET | ORAL | 0 refills | Status: AC

## 2024-08-14 MED ORDER — FUROSEMIDE 40 MG PO TABS: 40.0000 mg | ORAL_TABLET | Freq: Two times a day (BID) | ORAL | 3 refills | Status: DC

## 2024-08-14 MED ORDER — LORAZEPAM 0.5 MG PO TABS: 0.5000 mg | ORAL_TABLET | ORAL | 0 refills | Status: DC | PRN

## 2024-08-14 MED ORDER — MORPHINE SULFATE (CONCENTRATE) 20 MG/ML PO SOLN: 10.0000 mg | ORAL | 0 refills | Status: DC | PRN

## 2024-08-14 NOTE — Evaluation (Signed)
 Physical Therapy Evaluation Patient Details Name: Samuel [name] MRN: [id number] DOB: 02-12-1956 Today's Date: 08/14/2024  History of Present Illness  Samuel [name] is a 68 y.o. male with past medical history significant for advanced interstitial lung disease (on 6-8L [place] at baseline), hypothyroidism, asthma, multiple myeloma, GERD who presented to [facility] ED on 08/09/2024 with progressive shortness of breath over the last 3 days.  Additionally reports progressive weakness since February 2025  Clinical Impression   Patient's daughter present. Patient asking about exercises for home. Patient provided instructions in UE and LE's. A HEP was made but patient had DC'd prior to PT being to give to pat. Patient DC's Home with Hospice services.          If plan is discharge home, recommend the following: A lot of help with walking and/or transfers;A lot of help with bathing/dressing/bathroom   Can travel by private vehicle        Equipment Recommendations None recommended by PT [facility]  equipment)  Recommendations for Other Services       Functional Status Assessment Patient has had a recent decline in their functional status and/or demonstrates limited ability to make significant improvements in function in a reasonable and predictable amount of time     Precautions / Restrictions Precautions Precautions: Fall;Other (comment) Precaution/Restrictions Comments: currently on 6L O2 at rest, desats with minimal exertion, 6-8L O2 at home at baseline Restrictions Weight Bearing Restrictions Per Provider Order: No      Mobility  Bed Mobility               General bed mobility comments: did not mobilize, patient reports feeling unwell, did not want to push patient since to DC home today    Transfers                   General transfer comment: deferred    Ambulation/Gait                  Stairs            Wheelchair Mobility     Tilt Bed    Modified  Rankin (Stroke Patients Only)       Balance                                             Pertinent Vitals/Pain      Home Living Family/patient expects to be discharged to:: Private residence Living Arrangements: Spouse/significant other Available Help at Discharge: Family;Available 24 hours/day Type of Home: House Home Access: Stairs to enter   [employer] of Steps: 2-3   Home Layout: One level Home Equipment: Shower seat;Wheelchair - manual;Grab bars - tub/shower      Prior Function Prior Level of Function : Needs assist             Mobility Comments: pt reports that he mostly uses a w/c but can walk a few feet to transfer to w/c ADLs Comments: wife assists him with ADLs/selfcare due to SOB, DOE, O2 SATs and anxiety, takes increased time to complete     Extremity/Trunk Assessment   Upper Extremity Assessment Upper Extremity Assessment: Overall WFL for tasks assessed    Lower Extremity Assessment Lower Extremity Assessment: Generalized weakness    Cervical / Trunk Assessment Cervical / Trunk Assessment: Normal  Communication   Communication Communication: No apparent difficulties  Cognition Arousal: Alert Behavior During Therapy: Anxious                           PT - Cognition Comments: at times  closes eyes and is quiet Following commands: Intact       Cueing Cueing Techniques: Verbal cues     General Comments      Exercises General Exercises - Lower Extremity Ankle Circles/Pumps: AROM, Both, 5 reps Quad Sets: AROM, Both, 5 reps Short Arc Quad: AROM, Both, 5 reps Heel Slides: AROM, Both, 5 reps Other Exercises Other Exercises: UE stretch , hands behind head Other Exercises: IR/ER of legs in supine Other Exercises: lower trunk rotation   Assessment/Plan    PT Assessment Patient does not need any further PT services  PT Problem List         PT Treatment Interventions      PT Goals (Current  goals can be found in the Care Plan section)  Acute Rehab PT Goals Patient Stated Goal: go home PT Goal Formulation: All assessment and education complete, DC therapy    Frequency       Co-evaluation               AM-PAC PT 6 Clicks Mobility  Outcome Measure Help needed turning from your back to your side while in a flat bed without using bedrails?: None Help needed moving from lying on your back to sitting on the side of a flat bed without using bedrails?: A Lot Help needed moving to and from a bed to a chair (including a wheelchair)?: Total Help needed standing up from a chair using your arms (e.g., wheelchair or bedside chair)?: Total Help needed to walk in hospital room?: Total Help needed climbing 3-5 steps with a railing? : Total 6 Click Score: 10    End of Session   Activity Tolerance: Patient limited by fatigue;Treatment limited secondary to medical complications (Comment) Patient left: in bed;with call bell/phone within reach;with bed alarm set;with family/visitor present Nurse Communication: Mobility status PT Visit Diagnosis: Muscle weakness (generalized) (M62.81)    Time: 8958-8942 PT Time Calculation (min) (ACUTE ONLY): 16 min   Charges:   PT Evaluation $PT Eval Low Complexity: 1 Low   PT General Charges $$ ACUTE PT VISIT: 1 Visit         Darice Potters PT Acute Rehabilitation Services Office [phone]   Potters Darice Norris 08/14/2024, 1:55 PM

## 2024-08-14 NOTE — TOC Transition Note (Signed)
 Transition of Care [facility]) - Discharge Note   Patient Details  Name: Samuel [name] MRN: [id number] Date of Birth: October 19, 1956  Transition of Care [facility]) CM/SW Contact:  Heather DELENA Saltness, LCSW Phone Number: 08/14/2024, 10:22 AM   Clinical Narrative:    Pt discharging home with hospice services today. Per Randine, at [facility], DME has been delivered to pt's home. CSW spoke with pt's spouse, Solace Wendorff [phone], who reports pt will need ambulance transportation home. D/C packet placed in pt's chart at RN station. PTAR called at 10:55 AM. No further TOC needs at this time.   Final next level of care: Home w Hospice Care Barriers to Discharge: Barriers Resolved   Patient Goals and CMS Choice Patient states their goals for this hospitalization and ongoing recovery are:: To go home with hospice care CMS Medicare.gov Compare Post Acute Care list provided to:: Patient Represenative (must comment) Choice offered to / list presented to : Spouse    Discharge Placement  Home              Patient to be transferred to facility by: PTAR Name of family member notified: [name] Brines Patient and family notified of of transfer: 08/14/24  Discharge Plan and Services Additional resources added to the After Visit Summary for  Follow Up In-house Referral: Clinical Social Work   Post Acute Care Choice: Hospice          DME Arranged: N/A DME Agency: NA       HH Arranged: NA HH Agency: NA        Social Drivers of Health (SDOH) Interventions SDOH Screenings   Food Insecurity: No Food Insecurity (08/10/2024)  Housing: Low Risk  (08/10/2024)  Transportation Needs: No Transportation Needs (01/14/2024)   Received from [facility]  Utilities: Low Risk  (01/14/2024)   Received from [facility]  Depression (PHQ2-9): Low Risk  (02/03/2024)  Financial Resource Strain: Low Risk  (03/15/2022)   Received from [facility]  Physical Activity: Insufficiently Active (01/10/2022)    Received from [facility]  Social Connections: Socially Integrated (01/10/2022)   [facility] from [facility]  Stress: No Stress Concern Present (01/10/2022)   Received from [facility]  Tobacco Use: Medium Risk (08/11/2024)     Readmission Risk Interventions     No data to display          Signed: Heather Saltness, MSW, LCSW Clinical Social Worker Inpatient Care Management 08/14/2024 10:59 AM

## 2024-08-14 NOTE — Discharge Summary (Signed)
 Physician Discharge Summary  Samuel [name] FMW:[id number] DOB: 1956-01-10 DOA: 08/09/2024  PCP: Seabron Lenis, MD  Admit date: 08/09/2024 Discharge date: 08/14/2024  Admitted From: Home Disposition: Home with hospice  Recommendations for Outpatient Follow-up:  Follow up with hospice provider on discharge Continue prednisone  taper on discharge per pulmonology recommendations Started on furosemide  40 mg p.o. twice daily to assist with fluid mobilization from his lungs Overall prognosis poor given patient has failed multiple ILD agents including antifibrotic's and not tolerated pulmonary hypertension medications, not a lung transplant candidate due to multiple myeloma with life expectancy likely months.  Equipment/Devices: Oxygen with 2 concentrators  Discharge Condition: Guarded, long-term prognosis poor CODE STATUS: DNR Diet recommendation: Regular diet  History of present illness:  Samuel [name] is a 68 y.o. male with past medical history significant for advanced interstitial lung disease (on 6-8L [place] at baseline), hypothyroidism, asthma, multiple myeloma, GERD who presented to [facility] ED on 08/09/2024 with progressive shortness of breath over the last 3 days.  Additionally reports progressive weakness since February 2025.   In the ED, temperature 99.0 F, HR 64, RR 32, BP 157/91, SpO2 93% on 6 L nasal cannula and subsequent placed on 15 L nonrebreather.  WBC 9.7, hemoglobin 13.3, platelet count 245.  Sodium 132, potassium 4.7, chloride 94, CO2 30, glucose 99, BUN 17, creatinine 0.61.  AST 34, ALT 27, total bilirubin 0.4.  COVID/influenza/RSV PCR negative.  Respiratory viral panel negative.  Chest x-ray with continued findings of pulmonary fibrosis with low lung volumes, increased ground glass opacities within the lungs.  CT angiogram chest PE with limited exam but no pulmonary embolism noted, bilateral lower lobe consolidation, small pericardial effusion, mild diffuse pulmonary parenchymal  ground glass, underlying interstitial disease.  TRH was consulted for admission for further evaluation management of acute on chronic hypoxic respiratory failure secondary to ILD exacerbation.  Hospital course:  Acute on chronic hypoxic respiratory failure, POA Acute exacerbation of interstitial lung disease Patient presenting with progressive shortness of breath, weakness.  At baseline on 6-8 L nasal cannula at rest, has been requiring up to 10-15 L with minimal exertion at home.  Followed by pulmonology, Dr. Geronimo outpatient.  Has failed multiple ILD agents including antifibrotic's and has not tolerated pulmonary HTN medications.  Was referred to [facility] for lung transplant but not a candidate due to multiple myeloma.  Patient afebrile without leukocytosis.  CT imaging on admission notable for increased ground glass opacities, likely secondary to worsening fibrosis/inflammatory response.  Pulmonology was consulted and followed during hospital course.  Patient was treated with doxycycline, ceftriaxone, high-dose IV Solu-Medrol .  Additionally received IV furosemide  for fluid mobilization from his lungs.  Overall prognosis poor given progression of disease and will be discharging home to hospice services.   HTN Bisoprolol  5 mg p.o. daily   Anxiety Xanax  1 mg p.o. every 8 hours   Hypothyroidism Armour Thyroid  90 mg p.o. daily, Levothyroxine  50 mcg p.o. daily   Multiple myeloma Follows with medical oncology, Dr. Federico outpatient.  Also follows with palliative care.   GERD Protonix  40 m p.o. twice daily   BPH Tamsulosin  0.4 mg p.o. daily   Generalized weakness, debility, deconditioning Ongoing since February 2025 which has been progressive.  Likely related directly to his progressive interstitial lung disease as above.    Discharge Diagnoses:  Principal Problem:   Acute on chronic respiratory failure (HCC) Active Problems:   Mild persistent asthma   Monoclonal gammopathy    Multiple myeloma (HCC)   Anxiety  Interstitial pulmonary disease (HCC)   ILD (interstitial lung disease) (HCC)   Mixed hyperlipidemia   Essential hypertension   Hospice care patient    Discharge Instructions  Discharge Instructions     Diet - low sodium heart healthy   Complete by: As directed    Increase activity slowly   Complete by: As directed       Allergies as of 08/14/2024       Reactions   Amlodipine  Swelling   Beclomethasone Other (See Comments)   unknown   Ciclesonide Other (See Comments)   unknown   Clarithromycin Other (See Comments)   Hiccups   Ofev  [nintedanib] Other (See Comments)   GI bleeding   Penicillins Other (See Comments)   Child hood unsure   Rosuvastatin     Other Reaction(s): hand swelling   Trazodone    Other Reaction(s): sweating   Olga [treprostinil Sodium] Cough   Bronchial spasms, sore throat.        Medication List     STOP taking these medications    EPINEPHrine  0.3 mg/0.3 mL Devi Commonly known as: EPI-PEN   fexofenadine 180 MG tablet Commonly known as: ALLEGRA   ketoconazole 2 % cream Commonly known as: NIZORAL   sildenafil 20 MG tablet Commonly known as: REVATIO   Simethicone  180 MG Caps Commonly known as: Simethicone  Ultra Strength   [name] 0.6 MG/ML Soln Generic drug: Treprostinil       TAKE these medications    acyclovir  400 MG tablet Commonly known as: ZOVIRAX  TAKE ONE TABLET BY MOUTH TWICE DAILY   albuterol  (2.5 MG/3ML) 0.083% nebulizer solution Commonly known as: PROVENTIL  USE ONE VIAL IN NEBULIZER EVERY 6 HOURS AS NEEDED WHEEZING OR SHORTNESS OF BREATH   albuterol  108 (90 Base) MCG/ACT inhaler Commonly known as: VENTOLIN  HFA Inhale 2 puffs into the lungs every 6 hours as needed for wheezing or shortness of breath.   ALPRAZolam  0.5 MG tablet Commonly known as: XANAX  Take 0.5 mg by mouth at bedtime.   Assess Full Range Peak Meter Devi Generic drug: Peak Flow Meter as directed.    azelastine  0.1 % nasal spray Commonly known as: ASTELIN  Place 2 sprays into both nostrils 2 (two) times daily. Use in each nostril as directed   azithromycin  250 MG tablet Commonly known as: ZITHROMAX  250mg  once daily on  Monday Wed Friday   bisoprolol  5 MG tablet Commonly known as: ZEBETA  Take 1 tablet (5 mg total) by mouth daily.   Breztri  Aerosphere 160-9-4.8 MCG/ACT Aero inhaler Generic drug: budesonide-glycopyrrolate-formoterol Inhale 2 puffs into the lungs in the morning and at bedtime.   cetirizine  10 MG tablet Commonly known as: ZYRTEC  Take 1 tablet (10 mg total) by mouth daily.   cholecalciferol 1000 units tablet Commonly known as: VITAMIN D Take 3,000 Units by mouth daily.   Delsym 30 MG/5ML liquid Generic drug: dextromethorphan Take 30 mg by mouth as needed for cough.   ezetimibe -simvastatin  10-40 MG tablet Commonly known as: VYTORIN  Take 1 tablet by mouth daily.   fluticasone  50 MCG/ACT nasal spray Commonly known as: FLONASE Place 1 spray into both nostrils daily as needed for allergies or rhinitis.   furosemide  40 MG tablet Commonly known as: Lasix  Take 1 tablet (40 mg total) by mouth 2 (two) times daily. What changed:  when to take this reasons to take this   HYDROcodone  bit-homatropine 5-1.5 MG/5ML syrup Commonly known as: HYCODAN Take 5 mLs by mouth every 4 (four) hours as needed for cough.   ibuprofen 200  MG tablet Commonly known as: ADVIL Take 200 mg by mouth every 6 (six) hours as needed for moderate pain (pain score 4-6).   levothyroxine  50 MCG tablet Commonly known as: SYNTHROID  Take 50 mcg by mouth every morning.   LORazepam  0.5 MG tablet Commonly known as: ATIVAN  Take 1 tablet (0.5 mg total) by mouth every 4 (four) hours as needed for anxiety. May crush, mix with water and give sublingually if needed.   montelukast  10 MG tablet Commonly known as: SINGULAIR  Take 10 mg by mouth at bedtime.   morphine 20 MG/ML concentrated  solution Commonly known as: ROXANOL Take 0.5 mLs (10 mg total) by mouth every 4 (four) hours as needed for severe pain (pain score 7-10), shortness of breath or anxiety. May give sublingually if needed.   multivitamin tablet Take 1 tablet by mouth daily.   omeprazole  40 MG capsule Commonly known as: PRILOSEC Take 1 capsule (40 mg total) by mouth 2 (two) times daily. Take 30 minutes before a meal   OXYGEN Inhale 2 L into the lungs continuous.   potassium chloride  SA 20 MEQ tablet Commonly known as: Klor-Con  M20 Take 1 tablet (20 mEq total) by mouth daily.   predniSONE  10 MG tablet Commonly known as: DELTASONE  Take 4 tablets (40 mg total) by mouth daily for 5 days, THEN 2 tablets (20 mg total) daily for 5 days, THEN 1 tablet (10 mg total) daily for 5 days. Start taking on: August 15, 2024 What changed: See the new instructions.   sodium chloride  0.65 % Soln nasal spray Commonly known as: OCEAN Place 1 spray into both nostrils as needed for congestion.   tamsulosin  0.4 MG Caps capsule Commonly known as: FLOMAX  Take 0.4 mg by mouth daily.   thyroid  90 MG tablet Commonly known as: ARMOUR Take 90 mg by mouth daily.   triamcinolone  cream 0.1 % Commonly known as: KENALOG  Apply 1 application topically daily as needed (sun burn itch).   Turmeric 400 MG Caps Take 1 capsule by mouth daily.               Durable Medical Equipment  (From admission, onward)           Start     Ordered   08/13/24 1023  For home use only DME Other see comment  Once       Comments: Need a second 10 L oxygen concentrator for home use per pulmonology Dr. Annella to use with a nonrebreather  Question:  Length of Need  Answer:  Lifetime   08/13/24 1022            Allergies  Allergen Reactions   Amlodipine  Swelling   Beclomethasone Other (See Comments)    unknown   Ciclesonide Other (See Comments)    unknown   Clarithromycin Other (See Comments)    Hiccups   Ofev   [Nintedanib] Other (See Comments)    GI bleeding   Penicillins Other (See Comments)    Child hood unsure   Rosuvastatin      Other Reaction(s): hand swelling   Trazodone     Other Reaction(s): sweating   Olga [Treprostinil Sodium] Cough    Bronchial spasms, sore throat.    Consultations: Pulmonology   Procedures/Studies: CT Angio Chest PE W and/or Wo Contrast Result Date: 08/09/2024 CLINICAL DATA:  Increasing shortness of breath.  Multiple myeloma. EXAM: CT ANGIOGRAPHY CHEST WITH CONTRAST TECHNIQUE: Multidetector CT imaging of the chest was performed using the standard protocol during bolus administration of intravenous  contrast. Multiplanar CT image reconstructions and MIPs were obtained to evaluate the vascular anatomy. RADIATION DOSE REDUCTION: This exam was performed according to the departmental dose-optimization program which includes automated exposure control, adjustment of the mA and/or kV according to patient size and/or use of iterative reconstruction technique. CONTRAST:  75mL OMNIPAQUE  IOHEXOL  350 MG/ML SOLN COMPARISON:  12/24/2023 and CT chest 11/25/2022. FINDINGS: Cardiovascular: Image quality is degraded by respiratory motion, limiting the evaluation of the segmental and subsegmental pulmonary arteries in the mid and lower lung zones. Otherwise, no pulmonary embolus. Atherosclerotic calcification of the aorta, aortic valve and coronary arteries. Heart is enlarged. Small pericardial effusion. Mediastinum/Nodes: Subcarinal lymph node measures 1.8 cm and is likely reactive in etiology. Additional mediastinal and hilar lymph nodes are not enlarged by CT size criteria. No axillary adenopathy. Esophagus is grossly unremarkable. Lungs/Pleura: Diffuse pulmonary parenchymal ground-glass superimposed on interstitial and subpleural pulmonary fibrosis, better evaluated on CT chest 11/25/2022. Mild consolidation in the lower lobes. No pleural fluid. Airway is unremarkable. Upper Abdomen:  Patient's arms create streak artifact, degrading image quality. Visualized portions of the liver, gallbladder, adrenal glands, kidneys, spleen, pancreas, stomach and bowel are otherwise grossly unremarkable. No upper abdominal adenopathy. Musculoskeletal: Degenerative changes in the spine. Review of the MIP images confirms the above findings. IMPRESSION: 1. Evaluation of the mid and lower lung zone segmental and subsegmental pulmonary arteries is limited by respiratory motion. Otherwise, negative for pulmonary embolus. 2. Bilateral lower lobe consolidation may be due to aspiration. 3. Small pericardial effusion. 4. Mild diffuse pulmonary parenchymal ground-glass is nonspecific and may be infectious/inflammatory in etiology. 5. Underlying interstitial lung disease, better evaluated and characterized on CT chest 11/25/2022. 6. Aortic atherosclerosis (ICD10-I70.0). Coronary artery calcification. Electronically Signed   By: Newell Eke M.D.   On: 08/09/2024 17:54   DG Chest Portable 1 View Result Date: 08/09/2024 CLINICAL DATA:  Short of breath EXAM: PORTABLE CHEST 1 VIEW COMPARISON:  10/28/2023, 12/24/2023 FINDINGS: Single frontal view of the chest demonstrates stable enlargement of the cardiac silhouette. Lung volumes are diminished, with extensive pulmonary scarring and fibrosis again identified. Increased ground-glass opacities are seen within the bilateral lungs since prior exam, could reflect superimposed edema or alveolitis. No effusion or pneumothorax. No acute bony abnormalities. IMPRESSION: 1. Continued findings of pulmonary fibrosis, with low lung volumes noted. 2. Increased ground-glass opacities within the lungs, which could reflect alveolitis or edema. Electronically Signed   By: Ozell Daring M.D.   On: 08/09/2024 17:00   CT MAXILLOFACIAL WO CONTRAST Result Date: 07/29/2024 CLINICAL DATA:  Provided history: Chronic sinusitis, unspecified location. EXAM: CT MAXILLOFACIAL WITHOUT CONTRAST  TECHNIQUE: Multidetector CT images of the paranasal sinuses were obtained using the standard protocol without intravenous contrast. RADIATION DOSE REDUCTION: This exam was performed according to the departmental dose-optimization program which includes automated exposure control, adjustment of the mA and/or kV according to patient size and/or use of iterative reconstruction technique. COMPARISON:  Maxillofacial CT 03/31/2023. FINDINGS: Paranasal sinuses: Frontal: Normally aerated. Patent frontal sinus drainage pathways. Ethmoid: Normally aerated. Maxillary: Normally aerated. Sphenoid: Normally aerated. Patent sphenoethmoidal recesses. Right ostiomeatal unit: Patent. Left ostiomeatal unit: Patent. Nasal passages: No mass or polyp identified within the nasal passages. The nasal septum is essentially midline. Anatomy: Pneumatization is present superior to the anterior ethmoid notches bilaterally. Symmetric and intact olfactory grooves and fovea ethmoidalis, Keros II (4-31mm). Sellar sphenoid pneumatization pattern (however, the sphenoid sinuses only extends partly beneath the sella turcica). Other: 1.9 x 4.0 cm lipoma along the posterior aspect of the  left parotid gland. Calcified atherosclerotic plaque about the carotid bifurcations. Incompletely assessed cervical spondylosis. Mild grade 1 anterolisthesis at C2-C3. Mild grade 1 retrolisthesis at C3-C4. IMPRESSION: 1. Normally aerated paranasal sinuses. 2. Patent sinus drainage pathways. 3. 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 4. Calcified atherosclerotic plaque about the carotid bifurcations. 5. Incompletely assessed cervical spondylosis. Electronically Signed   By: Rockey Childs D.O.   On: 07/29/2024 21:01     Subjective: Patient seen examined bedside, lying in bed.  Family present at bedside.  Discharging with home hospice today.  Overall prognosis poor given his advanced and progressive interstitial lung disease with failed  outpatient medication treatment trials as well as progressive weakness with increased oxygen needs.  Discussed anticipated long-term prognosis measured in months.  No other questions or concerns at this time.  Denies headache, no chest pain, no abdominal pain, no fever.  No acute events overnight per nursing staff.  Discharge Exam: Vitals:   08/14/24 0853 08/14/24 0917  BP:  (!) 155/86  Pulse:  77  Resp:  19  Temp:  97.9 F (36.6 C)  SpO2: 99% 100%   Vitals:   08/13/24 2033 08/14/24 0454 08/14/24 0853 08/14/24 0917  BP: (!) 141/89 (!) 153/98  (!) 155/86  Pulse: 76 61  77  Resp:    19  Temp: 99 F (37.2 C) (!) 97.4 F (36.3 C)  97.9 F (36.6 C)  TempSrc: Oral Oral  Oral  SpO2: 99% 100% 99% 100%  Weight:      Height:        Physical Exam: GEN: NAD, alert and oriented x 3, chronically ill in appearance, anxious HEENT: NCAT, PERRL, EOMI, sclera clear, MMM PULM: + Crackles, normal respiratory effort with accessory muscle use, on 6L [place] with SpO2 100% at rest CV: RRR  GI: abd soft, NTND, + BS MSK: no peripheral edema, moves all extremity independently NEURO: No focal neurological deficit PSYCH: Anxious mood Integumentary: No concerning rashes/lesions/wounds noted on exposed skin surfaces    The results of significant diagnostics from this hospitalization (including imaging, microbiology, ancillary and laboratory) are listed below for reference.     Microbiology: Recent Results (from the past 240 hours)  Resp panel by RT-PCR (RSV, Flu A&B, Covid) Anterior Nasal Swab     Status: None   Collection Time: 08/09/24  3:16 PM   Specimen: Anterior Nasal Swab  Result Value Ref Range Status   SARS Coronavirus 2 by RT PCR NEGATIVE NEGATIVE Final    Comment: (NOTE) SARS-CoV-2 target nucleic acids are NOT DETECTED.  The SARS-CoV-2 RNA is generally detectable in upper respiratory specimens during the acute phase of infection. The lowest concentration of SARS-CoV-2 viral copies this  assay can detect is 138 copies/mL. A negative result does not preclude SARS-Cov-2 infection and should not be used as the sole basis for treatment or other patient management decisions. A negative result may occur with  improper specimen collection/handling, submission of specimen other than nasopharyngeal swab, presence of viral mutation(s) within the areas targeted by this assay, and inadequate number of viral copies(<138 copies/mL). A negative result must be combined with clinical observations, patient history, and epidemiological information. The expected result is Negative.  Fact Sheet for Patients:  bloggercourse.com  Fact Sheet for Healthcare Providers:  seriousbroker.it  This test is no t yet approved or cleared by the [place]  FDA and  has been authorized for detection and/or diagnosis of SARS-CoV-2 by FDA under an  Emergency Use Authorization (EUA). This EUA will remain  in effect (meaning this test can be used) for the duration of the COVID-19 declaration under Section 564(b)(1) of the Act, 21 U.S.C.section 360bbb-3(b)(1), unless the authorization is terminated  or revoked sooner.       Influenza A by PCR NEGATIVE NEGATIVE Final   Influenza B by PCR NEGATIVE NEGATIVE Final    Comment: (NOTE) The Xpert Xpress SARS-CoV-2/FLU/RSV plus assay is intended as an aid in the diagnosis of influenza from Nasopharyngeal swab specimens and should not be used as a sole basis for treatment. Nasal washings and aspirates are unacceptable for Xpert Xpress SARS-CoV-2/FLU/RSV testing.  Fact Sheet for Patients: bloggercourse.com  Fact Sheet for Healthcare Providers: seriousbroker.it  This test is not yet approved or cleared by the [place]  FDA and has been authorized for detection and/or diagnosis of SARS-CoV-2 by FDA under an Emergency Use Authorization (EUA). This EUA will  remain in effect (meaning this test can be used) for the duration of the COVID-19 declaration under Section 564(b)(1) of the Act, 21 U.S.C. section 360bbb-3(b)(1), unless the authorization is terminated or revoked.     Resp Syncytial Virus by PCR NEGATIVE NEGATIVE Final    Comment: (NOTE) Fact Sheet for Patients: bloggercourse.com  Fact Sheet for Healthcare Providers: seriousbroker.it  This test is not yet approved or cleared by the [place]  FDA and has been authorized for detection and/or diagnosis of SARS-CoV-2 by FDA under an Emergency Use Authorization (EUA). This EUA will remain in effect (meaning this test can be used) for the duration of the COVID-19 declaration under Section 564(b)(1) of the Act, 21 U.S.C. section 360bbb-3(b)(1), unless the authorization is terminated or revoked.  Performed at [facility], [address], [place], [place] [address]   Respiratory (~20 pathogens) panel by PCR     Status: None   Collection Time: 08/11/24 11:01 AM   Specimen: Nasopharyngeal Swab; Respiratory  Result Value Ref Range Status   Adenovirus NOT DETECTED NOT DETECTED Final   Coronavirus 229E NOT DETECTED NOT DETECTED Final    Comment: (NOTE) The Coronavirus on the Respiratory Panel, DOES NOT test for the novel  Coronavirus (2019 nCoV)    Coronavirus HKU1 NOT DETECTED NOT DETECTED Final   Coronavirus NL63 NOT DETECTED NOT DETECTED Final   Coronavirus OC43 NOT DETECTED NOT DETECTED Final   Metapneumovirus NOT DETECTED NOT DETECTED Final   Rhinovirus / Enterovirus NOT DETECTED NOT DETECTED Final   Influenza A NOT DETECTED NOT DETECTED Final   Influenza B NOT DETECTED NOT DETECTED Final   Parainfluenza Virus 1 NOT DETECTED NOT DETECTED Final   Parainfluenza Virus 2 NOT DETECTED NOT DETECTED Final   Parainfluenza Virus 3 NOT DETECTED NOT DETECTED Final   Parainfluenza Virus 4 NOT DETECTED NOT DETECTED  Final   Respiratory Syncytial Virus NOT DETECTED NOT DETECTED Final   Bordetella pertussis NOT DETECTED NOT DETECTED Final   Bordetella Parapertussis NOT DETECTED NOT DETECTED Final   Chlamydophila pneumoniae NOT DETECTED NOT DETECTED Final   Mycoplasma pneumoniae NOT DETECTED NOT DETECTED Final    Comment: Performed at [facility], [address] [place], [place] [address]     Labs: BNP (last 3 results) No results for input(s): BNP in the last 8760 hours. Basic Metabolic Panel: Recent Labs  Lab 08/09/24 1516 08/10/24 0622 08/12/24 0728 08/13/24 0537  NA 132* 133* 133* 138  K 4.7 4.5 4.5 4.0  CL 94* 95* 95* 95*  CO2 30 32 30  32  GLUCOSE 99 86 134* 132*  BUN 17 15 29* 30*  CREATININE 0.61 0.73 0.75 0.74  CALCIUM  9.4 9.0 9.2 9.0   Liver Function Tests: Recent Labs  Lab 08/09/24 1516 08/10/24 0622  AST 34 26  ALT 27 22  ALKPHOS 68 57  BILITOT 0.4 0.4  PROT 7.9 7.0  ALBUMIN 4.0 3.5   No results for input(s): LIPASE, AMYLASE in the last 168 hours. No results for input(s): AMMONIA in the last 168 hours. CBC: Recent Labs  Lab 08/09/24 1516 08/10/24 0622  WBC 9.7 8.3  NEUTROABS 8.4*  --   HGB 13.3 12.4*  HCT 40.8 38.8*  MCV 95.1 95.1  PLT 245 219   Cardiac Enzymes: No results for input(s): CKTOTAL, CKMB, CKMBINDEX, TROPONINI in the last 168 hours. BNP: Invalid input(s): POCBNP CBG: No results for input(s): GLUCAP in the last 168 hours. D-Dimer No results for input(s): DDIMER in the last 72 hours. Hgb A1c No results for input(s): HGBA1C in the last 72 hours. Lipid Profile No results for input(s): CHOL, HDL, LDLCALC, TRIG, CHOLHDL, LDLDIRECT in the last 72 hours. Thyroid  function studies No results for input(s): TSH, T4TOTAL, T3FREE, THYROIDAB in the last 72 hours.  Invalid input(s): FREET3 Anemia work up No results for input(s): VITAMINB12, FOLATE, FERRITIN, TIBC, IRON, RETICCTPCT in the  last 72 hours. Urinalysis No results found for: COLORURINE, APPEARANCEUR, LABSPEC, PHURINE, GLUCOSEU, HGBUR, BILIRUBINUR, KETONESUR, PROTEINUR, UROBILINOGEN, NITRITE, LEUKOCYTESUR Sepsis Labs Recent Labs  Lab 08/09/24 1516 08/10/24 0622  WBC 9.7 8.3   Microbiology Recent Results (from the past 240 hours)  Resp panel by RT-PCR (RSV, Flu A&B, Covid) Anterior Nasal Swab     Status: None   Collection Time: 08/09/24  3:16 PM   Specimen: Anterior Nasal Swab  Result Value Ref Range Status   SARS Coronavirus 2 by RT PCR NEGATIVE NEGATIVE Final    Comment: (NOTE) SARS-CoV-2 target nucleic acids are NOT DETECTED.  The SARS-CoV-2 RNA is generally detectable in upper respiratory specimens during the acute phase of infection. The lowest concentration of SARS-CoV-2 viral copies this assay can detect is 138 copies/mL. A negative result does not preclude SARS-Cov-2 infection and should not be used as the sole basis for treatment or other patient management decisions. A negative result may occur with  improper specimen collection/handling, submission of specimen other than nasopharyngeal swab, presence of viral mutation(s) within the areas targeted by this assay, and inadequate number of viral copies(<138 copies/mL). A negative result must be combined with clinical observations, patient history, and epidemiological information. The expected result is Negative.  Fact Sheet for Patients:  bloggercourse.com  Fact Sheet for Healthcare Providers:  seriousbroker.it  This test is no t yet approved or cleared by the [place]  FDA and  has been authorized for detection and/or diagnosis of SARS-CoV-2 by FDA under an Emergency Use Authorization (EUA). This EUA will remain  in effect (meaning this test can be used) for the duration of the COVID-19 declaration under Section 564(b)(1) of the Act, 21 U.S.C.section  360bbb-3(b)(1), unless the authorization is terminated  or revoked sooner.       Influenza A by PCR NEGATIVE NEGATIVE Final   Influenza B by PCR NEGATIVE NEGATIVE Final    Comment: (NOTE) The Xpert Xpress SARS-CoV-2/FLU/RSV plus assay is intended as an aid in the diagnosis of influenza from Nasopharyngeal swab specimens and should not be used as a sole basis for treatment. Nasal washings and aspirates are unacceptable for Xpert Xpress SARS-CoV-2/FLU/RSV testing.  Fact Sheet for Patients: bloggercourse.com  Fact Sheet for Healthcare Providers: seriousbroker.it  This test is not yet approved or cleared by the [place]  FDA and has been authorized for detection and/or diagnosis of SARS-CoV-2 by FDA under an Emergency Use Authorization (EUA). This EUA will remain in effect (meaning this test can be used) for the duration of the COVID-19 declaration under Section 564(b)(1) of the Act, 21 U.S.C. section 360bbb-3(b)(1), unless the authorization is terminated or revoked.     Resp Syncytial Virus by PCR NEGATIVE NEGATIVE Final    Comment: (NOTE) Fact Sheet for Patients: bloggercourse.com  Fact Sheet for Healthcare Providers: seriousbroker.it  This test is not yet approved or cleared by the [place]  FDA and has been authorized for detection and/or diagnosis of SARS-CoV-2 by FDA under an Emergency Use Authorization (EUA). This EUA will remain in effect (meaning this test can be used) for the duration of the COVID-19 declaration under Section 564(b)(1) of the Act, 21 U.S.C. section 360bbb-3(b)(1), unless the authorization is terminated or revoked.  Performed at [facility], [address] [place], [place] [address]   Respiratory (~20 pathogens) panel by PCR     Status: None   Collection Time: 08/11/24 11:01 AM   Specimen: Nasopharyngeal Swab;  Respiratory  Result Value Ref Range Status   Adenovirus NOT DETECTED NOT DETECTED Final   Coronavirus 229E NOT DETECTED NOT DETECTED Final    Comment: (NOTE) The Coronavirus on the Respiratory Panel, DOES NOT test for the novel  Coronavirus (2019 nCoV)    Coronavirus HKU1 NOT DETECTED NOT DETECTED Final   Coronavirus NL63 NOT DETECTED NOT DETECTED Final   Coronavirus OC43 NOT DETECTED NOT DETECTED Final   Metapneumovirus NOT DETECTED NOT DETECTED Final   Rhinovirus / Enterovirus NOT DETECTED NOT DETECTED Final   Influenza A NOT DETECTED NOT DETECTED Final   Influenza B NOT DETECTED NOT DETECTED Final   Parainfluenza Virus 1 NOT DETECTED NOT DETECTED Final   Parainfluenza Virus 2 NOT DETECTED NOT DETECTED Final   Parainfluenza Virus 3 NOT DETECTED NOT DETECTED Final   Parainfluenza Virus 4 NOT DETECTED NOT DETECTED Final   Respiratory Syncytial Virus NOT DETECTED NOT DETECTED Final   Bordetella pertussis NOT DETECTED NOT DETECTED Final   Bordetella Parapertussis NOT DETECTED NOT DETECTED Final   Chlamydophila pneumoniae NOT DETECTED NOT DETECTED Final   Mycoplasma pneumoniae NOT DETECTED NOT DETECTED Final    Comment: Performed at [facility], [address], [place], [place] [address]     Time coordinating discharge: Over 30 minutes  SIGNED:   Camellia PARAS Khalifa Knecht, DO  [facility] 08/14/2024, 10:29 AM

## 2024-08-16 LAB — MULTIPLE MYELOMA PANEL, SERUM
Albumin SerPl Elph-Mcnc: 3.2 g/dL (ref 2.9–4.4)
Albumin/Glob SerPl: 0.8 (ref 0.7–1.7)
Alpha 1: 0.3 g/dL (ref 0.0–0.4)
Alpha2 Glob SerPl Elph-Mcnc: 1 g/dL (ref 0.4–1.0)
B-Globulin SerPl Elph-Mcnc: 1.1 g/dL (ref 0.7–1.3)
Gamma Glob SerPl Elph-Mcnc: 2 g/dL — ABNORMAL HIGH (ref 0.4–1.8)
Globulin, Total: 4.3 g/dL — ABNORMAL HIGH (ref 2.2–3.9)
IgA: 374 mg/dL (ref 61–437)
IgG (Immunoglobin G), Serum: 1965 mg/dL — ABNORMAL HIGH (ref 603–1613)
IgM (Immunoglobulin M), Srm: 141 mg/dL (ref 20–172)
M Protein SerPl Elph-Mcnc: 0.7 g/dL — ABNORMAL HIGH
Total Protein ELP: 7.5 g/dL (ref 6.0–8.5)

## 2024-08-22 ENCOUNTER — Other Ambulatory Visit: Payer: Self-pay | Admitting: Internal Medicine

## 2024-09-08 ENCOUNTER — Inpatient Hospital Stay

## 2024-09-08 ENCOUNTER — Ambulatory Visit: Admitting: Adult Health

## 2024-09-08 ENCOUNTER — Inpatient Hospital Stay: Attending: Hematology and Oncology

## 2024-09-17 ENCOUNTER — Other Ambulatory Visit (INDEPENDENT_AMBULATORY_CARE_PROVIDER_SITE_OTHER): Payer: Self-pay | Admitting: Otolaryngology

## 2024-09-23 ENCOUNTER — Telehealth: Payer: Self-pay | Admitting: *Deleted

## 2024-09-23 NOTE — Telephone Encounter (Signed)
 REceived call from pt. He is inquiring about his Myeloma lab results. He has Hospice services at this time and they were able to obtain his labs earlier in the week including his Multiple Myeloma panel, Kapp/lambda light chains. Advised that I was on a conference call with hospice liaison and [facility] earlier in the week tp provide the actual lab test names so they could be done correctly. Advised that [employer] will fax the results to me directly. And then I would call him with those results and also mail him a copy of those results.  Signe feels strongly about keeping up with his Myeloma #s.  He states he is afraid that if his myeloma gets worse , he will begin to have pain. Advised the pain of myeloma usually comes from bony lesions but that we cannot predict if he will get any more than he has currently-which is minimal. He denies any pain at this time. 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 This is is life limiting illness, not his myeloma. He has cancelled his appt here in 10/06/24 as he cannot get out of bed.  Reminded him to let his Hospice team know if he does  experience any pain so that this can be treated. He did ask if his myeloma #s went up, would we treat that. 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 We discussed the current goal is comfort for him and treating distressing symptoms of myeloma should they occur and his s/s related to his ILD. Pt voiced understanding. Encouraged him to call back at any time with questions or concerns, that we are still here for him even if he cannot physically come to see us .  He said he very much appreciates that.

## 2024-09-29 ENCOUNTER — Telehealth: Payer: Self-pay | Admitting: Cardiology

## 2024-09-29 NOTE — Telephone Encounter (Signed)
 Called patient to schedule recall and wife stated pt is in hospice care. She ask the recall be deleted.

## 2024-10-06 ENCOUNTER — Inpatient Hospital Stay

## 2024-10-06 ENCOUNTER — Inpatient Hospital Stay: Admitting: Hematology and Oncology

## 2024-10-06 ENCOUNTER — Inpatient Hospital Stay: Attending: Hematology and Oncology

## 2024-10-21 DEATH — deceased
